# Patient Record
Sex: Male | Born: 1960 | Race: Black or African American | Hispanic: No | Marital: Single | Smoking: Never smoker
Health system: Southern US, Community
[De-identification: ages and names within clinical notes are randomized; demographics above are authoritative.]

## PROBLEM LIST (undated history)

## (undated) DIAGNOSIS — Z6841 Body Mass Index (BMI) 40.0 and over, adult: Secondary | ICD-10-CM

## (undated) DIAGNOSIS — R739 Hyperglycemia, unspecified: Secondary | ICD-10-CM

## (undated) DIAGNOSIS — Z95810 Presence of automatic (implantable) cardiac defibrillator: Secondary | ICD-10-CM

## (undated) DIAGNOSIS — N183 Chronic kidney disease, stage 3 unspecified: Secondary | ICD-10-CM

## (undated) DIAGNOSIS — M1990 Unspecified osteoarthritis, unspecified site: Secondary | ICD-10-CM

## (undated) DIAGNOSIS — I4892 Unspecified atrial flutter: Secondary | ICD-10-CM

## (undated) DIAGNOSIS — G4733 Obstructive sleep apnea (adult) (pediatric): Secondary | ICD-10-CM

## (undated) DIAGNOSIS — I5022 Chronic systolic (congestive) heart failure: Secondary | ICD-10-CM

## (undated) DIAGNOSIS — E785 Hyperlipidemia, unspecified: Secondary | ICD-10-CM

## (undated) DIAGNOSIS — K115 Sialolithiasis: Secondary | ICD-10-CM

## (undated) DIAGNOSIS — D638 Anemia in other chronic diseases classified elsewhere: Secondary | ICD-10-CM

## (undated) DIAGNOSIS — R079 Chest pain, unspecified: Secondary | ICD-10-CM

## (undated) DIAGNOSIS — I428 Other cardiomyopathies: Secondary | ICD-10-CM

## (undated) DIAGNOSIS — N529 Male erectile dysfunction, unspecified: Secondary | ICD-10-CM

## (undated) DIAGNOSIS — I10 Essential (primary) hypertension: Secondary | ICD-10-CM

## (undated) DIAGNOSIS — L0591 Pilonidal cyst without abscess: Secondary | ICD-10-CM

## (undated) HISTORY — DX: Morbid (severe) obesity due to excess calories: E66.01

## (undated) HISTORY — DX: Sialolithiasis: K11.5

## (undated) HISTORY — DX: Chronic kidney disease, stage 3 unspecified: N18.30

## (undated) HISTORY — DX: Chest pain, unspecified: R07.9

## (undated) HISTORY — DX: Obstructive sleep apnea (adult) (pediatric): G47.33

## (undated) HISTORY — DX: Hyperglycemia, unspecified: R73.9

## (undated) HISTORY — DX: Essential (primary) hypertension: I10

## (undated) HISTORY — DX: Unspecified atrial flutter: I48.92

## (undated) HISTORY — DX: Chronic kidney disease, stage 3 (moderate): N18.3

## (undated) HISTORY — DX: Male erectile dysfunction, unspecified: N52.9

## (undated) HISTORY — DX: Pilonidal cyst without abscess: L05.91

## (undated) HISTORY — PX: COLONOSCOPY: SHX174

## (undated) HISTORY — DX: Chronic systolic (congestive) heart failure: I50.22

## (undated) HISTORY — DX: Body Mass Index (BMI) 40.0 and over, adult: Z684

## (undated) HISTORY — DX: Other cardiomyopathies: I42.8

## (undated) HISTORY — DX: Anemia in other chronic diseases classified elsewhere: D63.8

## (undated) HISTORY — DX: Hyperlipidemia, unspecified: E78.5

## (undated) SURGERY — ECHOCARDIOGRAM, TRANSESOPHAGEAL
Anesthesia: Moderate Sedation

---

## 2001-02-14 ENCOUNTER — Emergency Department (HOSPITAL_COMMUNITY): Admission: EM | Admit: 2001-02-14 | Discharge: 2001-02-15 | Payer: Self-pay | Admitting: Emergency Medicine

## 2001-02-15 ENCOUNTER — Encounter: Payer: Self-pay | Admitting: Internal Medicine

## 2001-02-20 ENCOUNTER — Encounter: Admission: RE | Admit: 2001-02-20 | Discharge: 2001-02-20 | Payer: Self-pay | Admitting: Internal Medicine

## 2001-03-05 ENCOUNTER — Ambulatory Visit (HOSPITAL_COMMUNITY): Admission: RE | Admit: 2001-03-05 | Discharge: 2001-03-05 | Payer: Self-pay | Admitting: Internal Medicine

## 2001-03-14 ENCOUNTER — Encounter: Admission: RE | Admit: 2001-03-14 | Discharge: 2001-03-14 | Payer: Self-pay | Admitting: Internal Medicine

## 2001-04-19 ENCOUNTER — Encounter: Admission: RE | Admit: 2001-04-19 | Discharge: 2001-04-19 | Payer: Self-pay | Admitting: Internal Medicine

## 2001-06-06 ENCOUNTER — Encounter: Admission: RE | Admit: 2001-06-06 | Discharge: 2001-06-06 | Payer: Self-pay | Admitting: Internal Medicine

## 2001-08-08 ENCOUNTER — Encounter: Admission: RE | Admit: 2001-08-08 | Discharge: 2001-08-08 | Payer: Self-pay

## 2001-09-12 ENCOUNTER — Encounter: Admission: RE | Admit: 2001-09-12 | Discharge: 2001-09-12 | Payer: Self-pay | Admitting: Internal Medicine

## 2001-10-25 ENCOUNTER — Emergency Department (HOSPITAL_COMMUNITY): Admission: EM | Admit: 2001-10-25 | Discharge: 2001-10-25 | Payer: Self-pay | Admitting: Emergency Medicine

## 2001-12-12 ENCOUNTER — Encounter: Admission: RE | Admit: 2001-12-12 | Discharge: 2001-12-12 | Payer: Self-pay | Admitting: Internal Medicine

## 2002-05-16 ENCOUNTER — Encounter: Admission: RE | Admit: 2002-05-16 | Discharge: 2002-05-16 | Payer: Self-pay | Admitting: Internal Medicine

## 2002-11-06 ENCOUNTER — Encounter: Admission: RE | Admit: 2002-11-06 | Discharge: 2002-11-06 | Payer: Self-pay | Admitting: Infectious Diseases

## 2002-11-27 ENCOUNTER — Emergency Department (HOSPITAL_COMMUNITY): Admission: EM | Admit: 2002-11-27 | Discharge: 2002-11-27 | Payer: Self-pay | Admitting: Emergency Medicine

## 2003-10-07 ENCOUNTER — Encounter: Admission: RE | Admit: 2003-10-07 | Discharge: 2003-10-07 | Payer: Self-pay | Admitting: Internal Medicine

## 2003-10-23 ENCOUNTER — Emergency Department (HOSPITAL_COMMUNITY): Admission: EM | Admit: 2003-10-23 | Discharge: 2003-10-23 | Payer: Self-pay | Admitting: Emergency Medicine

## 2004-03-14 ENCOUNTER — Encounter: Admission: RE | Admit: 2004-03-14 | Discharge: 2004-03-14 | Payer: Self-pay | Admitting: Internal Medicine

## 2004-04-20 ENCOUNTER — Emergency Department (HOSPITAL_COMMUNITY): Admission: EM | Admit: 2004-04-20 | Discharge: 2004-04-20 | Payer: Self-pay

## 2004-10-20 ENCOUNTER — Ambulatory Visit: Payer: Self-pay | Admitting: Internal Medicine

## 2005-02-01 ENCOUNTER — Ambulatory Visit: Payer: Self-pay | Admitting: Internal Medicine

## 2005-02-08 ENCOUNTER — Ambulatory Visit: Payer: Self-pay | Admitting: Internal Medicine

## 2005-02-28 ENCOUNTER — Encounter (INDEPENDENT_AMBULATORY_CARE_PROVIDER_SITE_OTHER): Payer: Self-pay | Admitting: Cardiology

## 2005-02-28 ENCOUNTER — Ambulatory Visit (HOSPITAL_COMMUNITY): Admission: RE | Admit: 2005-02-28 | Discharge: 2005-02-28 | Payer: Self-pay | Admitting: Internal Medicine

## 2005-08-30 ENCOUNTER — Ambulatory Visit (HOSPITAL_COMMUNITY): Admission: RE | Admit: 2005-08-30 | Discharge: 2005-08-30 | Payer: Self-pay | Admitting: Internal Medicine

## 2005-08-30 ENCOUNTER — Ambulatory Visit: Payer: Self-pay | Admitting: Internal Medicine

## 2005-09-06 ENCOUNTER — Ambulatory Visit: Payer: Self-pay | Admitting: Internal Medicine

## 2005-11-30 ENCOUNTER — Ambulatory Visit: Payer: Self-pay | Admitting: Internal Medicine

## 2006-02-21 ENCOUNTER — Ambulatory Visit: Payer: Self-pay | Admitting: Internal Medicine

## 2006-04-05 ENCOUNTER — Ambulatory Visit: Payer: Self-pay | Admitting: Internal Medicine

## 2006-05-08 ENCOUNTER — Ambulatory Visit: Payer: Self-pay | Admitting: Internal Medicine

## 2006-05-08 ENCOUNTER — Encounter (INDEPENDENT_AMBULATORY_CARE_PROVIDER_SITE_OTHER): Payer: Self-pay | Admitting: Internal Medicine

## 2006-05-08 LAB — CONVERTED CEMR LAB
Cholesterol: 170 mg/dL (ref 0–200)
HDL: 31 mg/dL — ABNORMAL LOW (ref >39)
LDL Cholesterol: 119 mg/dL — ABNORMAL HIGH (ref 0–99)
Total CHOL/HDL Ratio: 5.5
Triglycerides: 98 mg/dL (ref <150)
VLDL: 20 mg/dL (ref 0–40)

## 2006-05-30 DIAGNOSIS — I119 Hypertensive heart disease without heart failure: Secondary | ICD-10-CM

## 2006-05-30 DIAGNOSIS — I428 Other cardiomyopathies: Secondary | ICD-10-CM | POA: Insufficient documentation

## 2006-05-30 DIAGNOSIS — N184 Chronic kidney disease, stage 4 (severe): Secondary | ICD-10-CM

## 2006-07-04 DIAGNOSIS — E785 Hyperlipidemia, unspecified: Secondary | ICD-10-CM

## 2006-07-04 DIAGNOSIS — Z6841 Body Mass Index (BMI) 40.0 and over, adult: Secondary | ICD-10-CM

## 2006-09-18 ENCOUNTER — Encounter (INDEPENDENT_AMBULATORY_CARE_PROVIDER_SITE_OTHER): Payer: Self-pay | Admitting: Internal Medicine

## 2006-09-18 ENCOUNTER — Ambulatory Visit: Payer: Self-pay | Admitting: Hospitalist

## 2006-09-18 LAB — CONVERTED CEMR LAB
BUN: 22 mg/dL (ref 6–23)
CO2: 22 meq/L (ref 19–32)
Calcium: 9.4 mg/dL (ref 8.4–10.5)
Chloride: 108 meq/L (ref 96–112)
Creatinine, Ser: 1.55 mg/dL — ABNORMAL HIGH (ref 0.40–1.50)
Glucose, Bld: 82 mg/dL (ref 70–99)
Potassium: 4.2 meq/L (ref 3.5–5.3)
Sodium: 145 meq/L (ref 135–145)

## 2006-11-21 ENCOUNTER — Ambulatory Visit: Payer: Self-pay | Admitting: Cardiology

## 2006-11-21 ENCOUNTER — Encounter: Payer: Self-pay | Admitting: Cardiology

## 2006-11-21 ENCOUNTER — Ambulatory Visit (HOSPITAL_COMMUNITY): Admission: RE | Admit: 2006-11-21 | Discharge: 2006-11-21 | Payer: Self-pay | Admitting: Internal Medicine

## 2007-06-25 ENCOUNTER — Telehealth: Payer: Self-pay | Admitting: *Deleted

## 2007-07-03 ENCOUNTER — Encounter (INDEPENDENT_AMBULATORY_CARE_PROVIDER_SITE_OTHER): Payer: Self-pay | Admitting: Internal Medicine

## 2007-07-03 ENCOUNTER — Ambulatory Visit: Payer: Self-pay | Admitting: Internal Medicine

## 2007-07-04 ENCOUNTER — Telehealth: Payer: Self-pay | Admitting: *Deleted

## 2007-07-04 LAB — CONVERTED CEMR LAB
ALT: 27 units/L (ref 0–53)
AST: 25 units/L (ref 0–37)
Albumin: 4.5 g/dL (ref 3.5–5.2)
Alkaline Phosphatase: 79 units/L (ref 39–117)
BUN: 17 mg/dL (ref 6–23)
CO2: 25 meq/L (ref 19–32)
Calcium: 9.4 mg/dL (ref 8.4–10.5)
Chloride: 108 meq/L (ref 96–112)
Cholesterol: 197 mg/dL (ref 0–200)
Creatinine, Ser: 1.54 mg/dL — ABNORMAL HIGH (ref 0.40–1.50)
Glucose, Bld: 85 mg/dL (ref 70–99)
HDL: 35 mg/dL — ABNORMAL LOW (ref >39)
LDL Cholesterol: 132 mg/dL — ABNORMAL HIGH (ref 0–99)
Potassium: 4 meq/L (ref 3.5–5.3)
Sodium: 143 meq/L (ref 135–145)
Total Bilirubin: 0.7 mg/dL (ref 0.3–1.2)
Total CHOL/HDL Ratio: 5.6
Total Protein: 7.5 g/dL (ref 6.0–8.3)
Triglycerides: 151 mg/dL — ABNORMAL HIGH (ref <150)
VLDL: 30 mg/dL (ref 0–40)

## 2007-08-19 ENCOUNTER — Ambulatory Visit: Payer: Self-pay | Admitting: Internal Medicine

## 2007-12-20 ENCOUNTER — Encounter (INDEPENDENT_AMBULATORY_CARE_PROVIDER_SITE_OTHER): Payer: Self-pay | Admitting: Infectious Diseases

## 2007-12-20 ENCOUNTER — Ambulatory Visit: Payer: Self-pay | Admitting: *Deleted

## 2007-12-20 LAB — CONVERTED CEMR LAB
ALT: 18 units/L (ref 0–53)
AST: 27 units/L (ref 0–37)
Albumin: 4.7 g/dL (ref 3.5–5.2)
Alkaline Phosphatase: 78 units/L (ref 39–117)
BUN: 19 mg/dL (ref 6–23)
CO2: 24 meq/L (ref 19–32)
Calcium: 9.6 mg/dL (ref 8.4–10.5)
Chloride: 107 meq/L (ref 96–112)
Creatinine, Ser: 1.45 mg/dL (ref 0.40–1.50)
Glucose, Bld: 94 mg/dL (ref 70–99)
Potassium: 4 meq/L (ref 3.5–5.3)
Sodium: 145 meq/L (ref 135–145)
Total Bilirubin: 0.6 mg/dL (ref 0.3–1.2)
Total Protein: 7.5 g/dL (ref 6.0–8.3)

## 2008-01-03 ENCOUNTER — Ambulatory Visit: Payer: Self-pay | Admitting: *Deleted

## 2008-01-03 ENCOUNTER — Encounter (INDEPENDENT_AMBULATORY_CARE_PROVIDER_SITE_OTHER): Payer: Self-pay | Admitting: Infectious Diseases

## 2008-01-03 LAB — CONVERTED CEMR LAB
Cholesterol: 147 mg/dL (ref 0–200)
HDL: 31 mg/dL — ABNORMAL LOW (ref >39)
LDL Cholesterol: 95 mg/dL (ref 0–99)
Total CHOL/HDL Ratio: 4.7
Triglycerides: 106 mg/dL (ref <150)
VLDL: 21 mg/dL (ref 0–40)

## 2008-02-03 ENCOUNTER — Telehealth (INDEPENDENT_AMBULATORY_CARE_PROVIDER_SITE_OTHER): Payer: Self-pay | Admitting: Internal Medicine

## 2008-06-19 ENCOUNTER — Emergency Department (HOSPITAL_COMMUNITY): Admission: EM | Admit: 2008-06-19 | Discharge: 2008-06-19 | Payer: Self-pay | Admitting: Emergency Medicine

## 2008-09-07 ENCOUNTER — Telehealth (INDEPENDENT_AMBULATORY_CARE_PROVIDER_SITE_OTHER): Payer: Self-pay | Admitting: Internal Medicine

## 2008-10-19 ENCOUNTER — Ambulatory Visit: Payer: Self-pay | Admitting: Internal Medicine

## 2008-10-19 ENCOUNTER — Encounter (INDEPENDENT_AMBULATORY_CARE_PROVIDER_SITE_OTHER): Payer: Self-pay | Admitting: Internal Medicine

## 2009-01-14 ENCOUNTER — Telehealth (INDEPENDENT_AMBULATORY_CARE_PROVIDER_SITE_OTHER): Payer: Self-pay | Admitting: Internal Medicine

## 2009-03-09 ENCOUNTER — Telehealth (INDEPENDENT_AMBULATORY_CARE_PROVIDER_SITE_OTHER): Payer: Self-pay | Admitting: Internal Medicine

## 2009-04-19 ENCOUNTER — Encounter (INDEPENDENT_AMBULATORY_CARE_PROVIDER_SITE_OTHER): Payer: Self-pay | Admitting: Internal Medicine

## 2009-04-19 ENCOUNTER — Ambulatory Visit: Payer: Self-pay | Admitting: Internal Medicine

## 2009-04-19 ENCOUNTER — Ambulatory Visit (HOSPITAL_COMMUNITY): Admission: RE | Admit: 2009-04-19 | Discharge: 2009-04-19 | Payer: Self-pay | Admitting: Infectious Diseases

## 2009-04-19 DIAGNOSIS — Z8674 Personal history of sudden cardiac arrest: Secondary | ICD-10-CM | POA: Insufficient documentation

## 2009-04-20 LAB — CONVERTED CEMR LAB
ALT: 18 units/L (ref 0–53)
AST: 25 units/L (ref 0–37)
Albumin: 4.7 g/dL (ref 3.5–5.2)
Alkaline Phosphatase: 72 units/L (ref 39–117)
BUN: 16 mg/dL (ref 6–23)
CO2: 23 meq/L (ref 19–32)
Calcium: 9.5 mg/dL (ref 8.4–10.5)
Chloride: 106 meq/L (ref 96–112)
Cholesterol: 182 mg/dL (ref 0–200)
Creatinine, Ser: 1.49 mg/dL (ref 0.40–1.50)
Creatinine, Urine: 20.3 mg/dL
Glucose, Bld: 103 mg/dL — ABNORMAL HIGH (ref 70–99)
HDL: 35 mg/dL — ABNORMAL LOW (ref >39)
LDL Cholesterol: 122 mg/dL — ABNORMAL HIGH (ref 0–99)
Microalb Creat Ratio: 24.6 mg/g (ref 0.0–30.0)
Microalb, Ur: 0.5 mg/dL (ref 0.00–1.89)
Potassium: 3.9 meq/L (ref 3.5–5.3)
Sodium: 145 meq/L (ref 135–145)
TSH: 1.079 microintl units/mL (ref 0.350–4.5)
Total Bilirubin: 0.7 mg/dL (ref 0.3–1.2)
Total CHOL/HDL Ratio: 5.2
Total Protein: 7.7 g/dL (ref 6.0–8.3)
Triglycerides: 127 mg/dL (ref <150)
VLDL: 25 mg/dL (ref 0–40)

## 2009-04-27 ENCOUNTER — Telehealth: Payer: Self-pay | Admitting: *Deleted

## 2009-08-26 ENCOUNTER — Emergency Department (HOSPITAL_COMMUNITY): Admission: EM | Admit: 2009-08-26 | Discharge: 2009-08-27 | Payer: Self-pay | Admitting: Emergency Medicine

## 2009-08-26 ENCOUNTER — Emergency Department (HOSPITAL_COMMUNITY): Admission: EM | Admit: 2009-08-26 | Discharge: 2009-08-26 | Payer: Self-pay | Admitting: Emergency Medicine

## 2009-09-02 ENCOUNTER — Ambulatory Visit: Payer: Self-pay | Admitting: Internal Medicine

## 2009-09-07 ENCOUNTER — Ambulatory Visit: Payer: Self-pay | Admitting: Internal Medicine

## 2009-09-07 LAB — CONVERTED CEMR LAB
ALT: 16 units/L (ref 0–53)
AST: 21 units/L (ref 0–37)
Albumin: 4.2 g/dL (ref 3.5–5.2)
Alkaline Phosphatase: 62 units/L (ref 39–117)
BUN: 16 mg/dL (ref 6–23)
CO2: 27 meq/L (ref 19–32)
Calcium: 9.6 mg/dL (ref 8.4–10.5)
Chloride: 107 meq/L (ref 96–112)
Cholesterol: 136 mg/dL (ref 0–200)
Creatinine, Ser: 1.47 mg/dL (ref 0.40–1.50)
Glucose, Bld: 93 mg/dL (ref 70–99)
HDL: 28 mg/dL — ABNORMAL LOW (ref >39)
LDL Cholesterol: 84 mg/dL (ref 0–99)
Potassium: 4.5 meq/L (ref 3.5–5.3)
Sodium: 145 meq/L (ref 135–145)
Total Bilirubin: 0.5 mg/dL (ref 0.3–1.2)
Total CHOL/HDL Ratio: 4.9
Total Protein: 7.2 g/dL (ref 6.0–8.3)
Triglycerides: 119 mg/dL (ref <150)
VLDL: 24 mg/dL (ref 0–40)

## 2009-09-09 ENCOUNTER — Emergency Department (HOSPITAL_COMMUNITY): Admission: EM | Admit: 2009-09-09 | Discharge: 2009-09-09 | Payer: Self-pay | Admitting: Emergency Medicine

## 2009-10-02 ENCOUNTER — Emergency Department (HOSPITAL_COMMUNITY): Admission: EM | Admit: 2009-10-02 | Discharge: 2009-10-02 | Payer: Self-pay | Admitting: Emergency Medicine

## 2009-10-21 ENCOUNTER — Emergency Department (HOSPITAL_COMMUNITY): Admission: EM | Admit: 2009-10-21 | Discharge: 2009-10-21 | Payer: Self-pay | Admitting: Emergency Medicine

## 2009-10-25 ENCOUNTER — Emergency Department (HOSPITAL_COMMUNITY): Admission: EM | Admit: 2009-10-25 | Discharge: 2009-10-26 | Payer: Self-pay | Admitting: Emergency Medicine

## 2009-12-03 ENCOUNTER — Emergency Department (HOSPITAL_COMMUNITY): Admission: EM | Admit: 2009-12-03 | Discharge: 2009-12-03 | Payer: Self-pay | Admitting: Emergency Medicine

## 2009-12-11 ENCOUNTER — Emergency Department (HOSPITAL_COMMUNITY): Admission: EM | Admit: 2009-12-11 | Discharge: 2009-12-11 | Payer: Self-pay | Admitting: Emergency Medicine

## 2010-02-14 ENCOUNTER — Telehealth: Payer: Self-pay | Admitting: Internal Medicine

## 2010-03-03 ENCOUNTER — Ambulatory Visit: Payer: Self-pay | Admitting: Internal Medicine

## 2010-03-03 DIAGNOSIS — N529 Male erectile dysfunction, unspecified: Secondary | ICD-10-CM

## 2010-03-04 LAB — CONVERTED CEMR LAB
Prolactin: 7.7 ng/mL (ref 2.1–17.1)
Testosterone: 221.47 ng/dL — ABNORMAL LOW (ref 350–890)

## 2010-03-10 ENCOUNTER — Encounter: Payer: Self-pay | Admitting: Internal Medicine

## 2010-03-15 ENCOUNTER — Emergency Department (HOSPITAL_COMMUNITY): Admission: EM | Admit: 2010-03-15 | Discharge: 2010-03-15 | Payer: Self-pay | Admitting: Family Medicine

## 2010-06-30 ENCOUNTER — Telehealth: Payer: Self-pay | Admitting: Internal Medicine

## 2010-07-24 HISTORY — PX: CARDIAC CATHETERIZATION: SHX172

## 2010-07-26 ENCOUNTER — Encounter: Payer: Self-pay | Admitting: Internal Medicine

## 2010-08-02 ENCOUNTER — Telehealth (INDEPENDENT_AMBULATORY_CARE_PROVIDER_SITE_OTHER): Payer: Self-pay | Admitting: *Deleted

## 2010-08-02 ENCOUNTER — Emergency Department (HOSPITAL_COMMUNITY)
Admission: EM | Admit: 2010-08-02 | Discharge: 2010-08-02 | Payer: Self-pay | Source: Home / Self Care | Admitting: Emergency Medicine

## 2010-08-02 ENCOUNTER — Encounter: Payer: Self-pay | Admitting: Internal Medicine

## 2010-08-08 LAB — POCT CARDIAC MARKERS
CKMB, poc: 3.8 ng/mL (ref 1.0–8.0)
Myoglobin, poc: 193 ng/mL (ref 12–200)
Troponin i, poc: <0.05 ng/mL (ref 0.00–0.09)

## 2010-08-08 LAB — DIFFERENTIAL
Basophils Absolute: 0 10*3/uL (ref 0.0–0.1)
Basophils Relative: 0 % (ref 0–1)
Eosinophils Absolute: 0.2 10*3/uL (ref 0.0–0.7)
Eosinophils Relative: 2 % (ref 0–5)
Lymphocytes Relative: 35 % (ref 12–46)
Lymphs Abs: 4.1 10*3/uL — ABNORMAL HIGH (ref 0.7–4.0)
Monocytes Absolute: 0.9 10*3/uL (ref 0.1–1.0)
Monocytes Relative: 8 % (ref 3–12)
Neutro Abs: 6.5 10*3/uL (ref 1.7–7.7)
Neutrophils Relative %: 55 % (ref 43–77)

## 2010-08-08 LAB — BASIC METABOLIC PANEL
BUN: 11 mg/dL (ref 6–23)
CO2: 28 mEq/L (ref 19–32)
Calcium: 9.2 mg/dL (ref 8.4–10.5)
Chloride: 109 mEq/L (ref 96–112)
Creatinine, Ser: 1.4 mg/dL (ref 0.4–1.5)
GFR calc Af Amer: >60 mL/min (ref >60)
GFR calc non Af Amer: 54 mL/min — ABNORMAL LOW (ref >60)
Glucose, Bld: 97 mg/dL (ref 70–99)
Potassium: 3.6 mEq/L (ref 3.5–5.1)
Sodium: 144 mEq/L (ref 135–145)

## 2010-08-08 LAB — CBC
HCT: 42.1 % (ref 39.0–52.0)
Hemoglobin: 13.6 g/dL (ref 13.0–17.0)
MCH: 28.2 pg (ref 26.0–34.0)
MCHC: 32.3 g/dL (ref 30.0–36.0)
MCV: 87.3 fL (ref 78.0–100.0)
Platelets: 188 10*3/uL (ref 150–400)
RBC: 4.82 MIL/uL (ref 4.22–5.81)
RDW: 12.9 % (ref 11.5–15.5)
WBC: 11.7 10*3/uL — ABNORMAL HIGH (ref 4.0–10.5)

## 2010-08-08 LAB — BRAIN NATRIURETIC PEPTIDE: Pro B Natriuretic peptide (BNP): 225 pg/mL — ABNORMAL HIGH (ref 0.0–100.0)

## 2010-08-10 ENCOUNTER — Ambulatory Visit: Admission: RE | Admit: 2010-08-10 | Discharge: 2010-08-10 | Payer: Self-pay | Source: Home / Self Care

## 2010-08-23 NOTE — Progress Notes (Signed)
Summary: REfill/gh  Phone Note Refill Request Message from:  Patient on February 14, 2010 2:25 PM  Refills Requested: Medication #1:  LASIX 80 MG TABS Take 1 tablet by mouth once a day   Last Refilled: 01/11/2010  Medication #2:  VASOTEC 20 MG TABS Take 2 tablets by mouth once daily   Last Refilled: 01/25/2010  Medication #3:  ZOCOR 40 MG TABS take 1 pill by mouth daily.. Last visit and labs were 08/2009.   Method Requested: Fax to Local Pharmacy Initial call taken by: Angelina Ok RN,  February 14, 2010 2:26 PM  Follow-up for Phone Call        Rx faxed to pharmacy. Follow-up by: Margarito Liner MD,  February 14, 2010 3:00 PM    Prescriptions: ZOCOR 40 MG TABS (SIMVASTATIN) take 1 pill by mouth daily.  #30 x 1   Entered and Authorized by:   Margarito Liner MD   Signed by:   Margarito Liner MD on 02/14/2010   Method used:   Faxed to ...       [facility] (retail)       [address] [place], [place]  [address]       Ph: [phone]       Fax: [phone]   RxID:   [id number] LASIX 80 MG TABS (FUROSEMIDE) Take 1 tablet by mouth once a day  #30 x 1   Entered and Authorized by:   Margarito Liner MD   Signed by:   Margarito Liner MD on 02/14/2010   Method used:   Faxed to ...       [facility] (retail)       [address] [place], [place]  [address]       Ph: [phone]       Fax: [phone]   RxID:   [id number] VASOTEC 20 MG TABS (ENALAPRIL MALEATE) Take 2 tablets by mouth once daily  #60 x 1   Entered and Authorized by:   Margarito Liner MD   Signed by:   Margarito Liner MD on 02/14/2010   Method used:   Faxed to ...       [facility] (retail)       [address] [place], [place]  [address]       Ph: [phone]       Fax: [phone]   RxID:   [id number]

## 2010-08-23 NOTE — Assessment & Plan Note (Signed)
Summary: est-ck/fu/meds/cfb   Vital Signs:  Patient profile:   50 year old male Height:      69.5 inches (176.53 cm) Weight:      305.0 pounds (138.64 kg) BMI:     44.56 Temp:     97.6 degrees F (36.44 degrees C) oral Pulse rate:   91 / minute BP sitting:   144 / 93  (left arm)  Vitals Entered By: Stanton Kidney Ditzler RN (September 02, 2009 1:42 PM) Is Patient Diabetic? No Pain Assessment Patient in pain? yes     Location: left leg Intensity: 3 Type: dull Onset of pain  gout - past 4 days Nutritional Status BMI of > 30 = obese Nutritional Status Detail appetite good  Have you ever been in a relationship where you felt threatened, hurt or afraid?denies   Does patient need assistance? Functional Status Self care Ambulation Normal Comments FU.   Primary Care Provider:  Lollie Sails MD   History of Present Illness: Eric Murillo is a 50 yo man with PMH as outlined in the EMR comes today for a f/u visit.   1. HTN: He didn't bring his medication today, but states he is taking his meds regularly.   2. HL: He takes his zocor, but he didn't bring his medicine.   4. Obesity: He has started to go back to gym.   5. CM: He wasn't able to get his ECHO done last time.   Depression History:      The patient denies a depressed mood most of the day and a diminished interest in his usual daily activities.         Preventive Screening-Counseling & Management  Alcohol-Tobacco     Smoking Status: never  Caffeine-Diet-Exercise     Does Patient Exercise: no  Current Medications (verified): 1)  Vasotec 20 Mg Tabs (Enalapril Maleate) .... Take 2 Tablets By Mouth Once Daily 2)  Lasix 80 Mg Tabs (Furosemide) .... Take 1 Tablet By Mouth Once A Day 3)  Zocor 40 Mg Tabs (Simvastatin) .... Take 1 Pill By Mouth Daily.  Allergies: 1)  ! Beta Blockers  Review of Systems      See HPI  Physical Exam  General:  alert.   Lungs:  normal breath sounds, no crackles, and no wheezes.   Heart:   normal rate, regular rhythm, no murmur, and no gallop.   Abdomen:  soft and non-tender.   Extremities:  trace left pedal edema and trace right pedal edema.   Neurologic:  alert & oriented X3.     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 He wasnot able to do ECHO last ime, will try to get one now.   Problem # 2:  HYPERTENSION (ICD-401.9) BP close to goal, will cont his regimen. CHeck following labs.  His updated medication list for this problem includes:    Vasotec 20 Mg Tabs (Enalapril maleate) .Marland Kitchen... Take 2 tablets by mouth once daily    Lasix 80 Mg Tabs (Furosemide) .Marland Kitchen... Take 1 tablet by mouth once a day  Orders: T-Lipid Profile ([id number]) T-Comprehensive Metabolic Panel ([id number])  BP today: 144/93 Prior BP: 142/84 (04/19/2009)  Labs Reviewed: K+: 3.9 (04/19/2009) Creat: : 1.49 (04/19/2009)   Chol: 182 (04/19/2009)   HDL: 35 (04/19/2009)   LDL: 122 (04/19/2009)   TG: 127 (04/19/2009)  Problem # 3:  RENAL INSUFFICIENCY (ICD-588.9) Check renal fnl.  Orders: T-Lipid Profile ([id number]) T-Comprehensive Metabolic Panel [phone])  Problem #  4:  DYSLIPIDEMIA (ICD-272.4) Cont same and check followings.  His updated medication list for this problem includes:    Zocor 40 Mg Tabs (Simvastatin) .Marland Kitchen... Take 1 pill by mouth daily.  Orders: T-Lipid Profile ([id number]) T-Comprehensive Metabolic Panel [phone] [phone])  Problem # 5:  OVERWEIGHT (ICD-278.02) Encouraged to remain active and check his diet.  Orders: T-Lipid Profile [id number]) T-Comprehensive Metabolic Panel [phone])  Complete Medication List: 1)  Vasotec 20 Mg Tabs (Enalapril maleate) .... Take 2 tablets by mouth once daily 2)  Lasix 80 Mg Tabs (Furosemide) .... Take 1 tablet by mouth once a day 3)  Zocor 40 Mg Tabs (Simvastatin) .... Take 1 pill by mouth daily.  Patient Instructions: 1)  Limit your Sodium (Salt) to less than 2 grams  a day(slightly less than 1/2 a teaspoon) to prevent fluid retention, swelling, or worsening of symptoms. 2)  It is important that you exercise regularly at least 20 minutes 5 times a week. If you develop chest pain, have severe difficulty breathing, or feel very tired , stop exercising immediately and seek medical attention. 3)  You need to lose weight. Consider a lower calorie diet and regular exercise.  4)  Check your Blood Pressure regularly. If it is above: you should make an appointment. 5)  Please schedule a follow-up appointment in 6 months. Prescriptions: ZOCOR 40 MG TABS (SIMVASTATIN) take 1 pill by mouth daily.  #30 x 3   Entered and Authorized by:   Jason Coop MD   Signed by:   Jason Coop MD on 09/02/2009   Method used:   Print then Give to Patient   RxID:   [id number] LASIX 80 MG TABS (FUROSEMIDE) Take 1 tablet by mouth once a day  #30 x 3   Entered and Authorized by:   Jason Coop MD   Signed by:   Jason Coop MD on 09/02/2009   Method used:   Print then Give to Patient   RxID:   [id number] VASOTEC 20 MG TABS (ENALAPRIL MALEATE) Take 2 tablets by mouth once daily  #60 x 3   Entered and Authorized by:   Jason Coop MD   Signed by:   Jason Coop MD on 09/02/2009   Method used:   Print then Give to Patient   RxID:   [id number]  Process Orders Check Orders Results:     [facility]: ABN not required for this insurance Tests Sent for requisitioning (September 03, 2009 1:38 PM):     09/02/2009: [facility] -- T-Lipid Profile [phone] (signed)     09/02/2009: [facility] -- T-Comprehensive Metabolic Panel [phone] (signed)    Process Orders Check Orders Results:     [facility]: ABN not required for this insurance Tests Sent for requisitioning (September 03, 2009 1:38 PM):     09/02/2009: [facility] -- T-Lipid Profile  [phone] (signed)     09/02/2009: [facility] -- T-Comprehensive Metabolic Panel [phone] (signed)     Prevention & Chronic Care Immunizations   Influenza vaccine: Fluvax Non-MCR  (04/19/2009)    Tetanus booster: Not documented    Pneumococcal vaccine: Not documented  Other Screening   PSA: Not documented   Smoking status: never  (09/02/2009)  Lipids   Total Cholesterol: 182  (04/19/2009)   LDL: 122  (04/19/2009)   LDL Direct: Not documented   HDL: 35  (04/19/2009)   Triglycerides: 127  (04/19/2009)    SGOT (AST): 25  (04/19/2009)   SGPT (ALT): 18  (  04/19/2009) CMP ordered    Alkaline phosphatase: 72  (04/19/2009)   Total bilirubin: 0.7  (04/19/2009)    Lipid flowsheet reviewed?: Yes   Progress toward LDL goal: Unchanged  Hypertension   Last Blood Pressure: 144 / 93  (09/02/2009)   Serum creatinine: 1.49  (04/19/2009)   Serum potassium 3.9  (04/19/2009) CMP ordered     Hypertension flowsheet reviewed?: Yes   Progress toward BP goal: Unchanged  Self-Management Support :   Personal Goals (by the next clinic visit) :      Personal blood pressure goal: 140/90  (09/02/2009)     Personal LDL goal: 100  (09/02/2009)    Patient will work on the following items until the next clinic visit to reach self-care goals:     Medications and monitoring: take my medicines every day, weigh myself weekly  (09/02/2009)     Eating: eat more vegetables, use fresh or frozen vegetables, eat foods that are low in salt, eat fruit for snacks and desserts, limit or avoid alcohol  (09/02/2009)     Activity: take a 30 minute walk every day  (09/02/2009)    Hypertension self-management support: Written self-care plan  (09/02/2009)   Hypertension self-care plan printed.    Lipid self-management support: Written self-care plan  (09/02/2009)   Lipid self-care plan printed.

## 2010-08-23 NOTE — Assessment & Plan Note (Signed)
Summary: RA/NEEDS REFILL ON MEDS AND CHECKUP/CH   Vital Signs:  Patient profile:   50 year old male Height:      69.5 inches (176.53 cm) Weight:      298.9 pounds (135.86 kg) BMI:     43.66 Temp:     98.0 degrees F (36.67 degrees C) oral Pulse rate:   87 / minute BP sitting:   133 / 92  (right arm)  Vitals Entered By: Stanton Kidney Ditzler RN (March 03, 2010 3:31 PM) Is Patient Diabetic? No Pain Assessment Patient in pain? no      Nutritional Status BMI of > 30 = obese Nutritional Status Detail appetite good  Have you ever been in a relationship where you felt threatened, hurt or afraid?denies   Does patient need assistance? Functional Status Self care Ambulation Normal Comments Ck-up and refills on meds.   Primary Care Provider:  Lollie Sails MD   History of Present Illness: Patient comes in today for his regular check-up.  No complaints.   Does complain of erections not lasting when he gets them. Says this has started since he started taking blood pressure medication.  He is able to masturbate to orgasm, however he does have difficulty maintaining an erection with sexual intercourse.    No CP, SOB, palpitations, N/V/constipation/diarrhea, joint pains, dizziness, vision changes, swelling in his extremities.   Depression History:      The patient denies a depressed mood most of the day and a diminished interest in his usual daily activities.         Preventive Screening-Counseling & Management  Alcohol-Tobacco     Smoking Status: never  Caffeine-Diet-Exercise     Does Patient Exercise: no  Current Problems (verified): 1)  Erectile Dysfunction, Organic  (ICD-607.84) 2)  Irregular Heart Rate  (ICD-427.9) 3)  Health Maintenance Exam  (ICD-V70.0) 4)  Cardiomyopathy, Dilated  (ICD-425.4) 5)  Hypertension  (ICD-401.9) 6)  Renal Insufficiency  (ICD-588.9) 7)  Dyslipidemia  (ICD-272.4) 8)  Overweight  (ICD-278.02)  Current Medications (verified): 1)  Vasotec 20 Mg  Tabs (Enalapril Maleate) .... Take 2 Tablets By Mouth Once Daily 2)  Lasix 80 Mg Tabs (Furosemide) .... Take 1 Tablet By Mouth Once A Day 3)  Zocor 40 Mg Tabs (Simvastatin) .... Take 1 Pill By Mouth Daily. 4)  Viagra 50 Mg Tabs (Sildenafil Citrate) .... Take 1 Tablet By Mouth Daily As Needed For Sexual Activity 5)  Aspirin 81 Mg Tabs (Aspirin) .... Take 1 Tablet By Mouth Daily  Allergies: 1)  ! 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  Unemployed, formerly a Film/video editor.  1 sexual partner (male), no contraceptives.  1 adult child.  tobacco - none alcohol - none drugs - none  Review of Systems       see HPI  Physical Exam  General:  NAD, muscular obese male Eyes:  pupils equal, pupils round, and pupils reactive to light.   Mouth:  pharynx pink and moist and fair dentition.   Neck:  supple, full ROM, and no masses.   Lungs:  normal respiratory effort, normal breath sounds, no crackles, and no wheezes.   Heart:  normal rate, regular rhythm, and no murmur.   Abdomen:  soft, non-tender,  and normal bowel sounds.   Msk:  normal ROM, no joint tenderness, and no joint swelling.   Pulses:  2+ bilateral pedal pulses Extremities:  no edema Neurologic:  alert & oriented X3, strength normal in all extremities, sensation intact to light touch, and gait normal.   Cervical Nodes:  no anterior cervical adenopathy.   Psych:  Oriented X3, normally interactive, good eye contact, not anxious appearing, and not depressed appearing.     Impression & Recommendations:  Problem # 1:  CARDIOMYOPATHY, DILATED (ICD-425.4) Patient with ECHO in 2008 showing almost full recover to normal EF (EF 50 % to 55 % with LV wall  thickness at upper limits of normal).  Patient is not currently symptomatic.  Do not see need for repeat ECHO at this time.  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  However, he is not on a beta blocker, which is best known for causing ED.  We will check the patient's testosterone and prolactin levels, as if these are abnormal they can be addressed, however I suspect that the patient's HTN has contributed to the development of his ED.  Also, he has had this problem for several years (only bringing to our attention today), and there is likely a psychogenic component to his difficulty in maintaining an erection as he worries about it.  We will try a small trial of viagra.  His updated medication list for this problem includes:    Viagra 50 Mg Tabs (Sildenafil citrate) .Marland Kitchen... Take 1 tablet by mouth daily as needed for sexual activity  Orders: T-Prolactin ([id number]) T-Testosterone; Total [phone])  Problem # 3:  HYPERTENSION (ICD-401.9) Patient is very near goal today; we will continue his regimen and check a B-Met at his next visit.  His updated medication list for this problem includes:    Vasotec 20 Mg Tabs (Enalapril maleate) .Marland Kitchen... Take 2 tablets by mouth once daily    Lasix 80 Mg Tabs (Furosemide) .Marland Kitchen... Take 1 tablet by mouth once a day  Problem # 4:  RENAL INSUFFICIENCY (ICD-588.9) Patient has h/o renal insufficiency with a creatinine in the high-normal limits.  He has a large amount of muscle mass, so it is possible that this is his normal creatinine with good renal function, however his HTN cannot be overlooked.  His creatinine has been stable over the past few years; we will recheck his kidney function at his next appointment.  Problem # 5:  DYSLIPIDEMIA (ICD-272.4) The patient's last lipid panel was within goal.  We will recheck a lipid panel at his next visit in 6 months.  Continue the  simvastatin at 40mg  daily. His updated medication list for this problem includes:    Zocor 40 Mg Tabs (Simvastatin) .Marland Kitchen... Take 1 pill by mouth daily.  Problem # 6:  OVERWEIGHT (ICD-278.02) The patient would like to lose weight; I gave him information on attending [employer] [employer] [facility]'s healthy living class, and he expressed interest.  I counseled him on continuing to work on his diet and exercise.  Problem # 7:  Preventive Health Care (ICD-V70.0) We will start the patient on a daily baby aspirin to reduce his risk of MI as a male >45y/o.  The patient recieved a tetanus shot today.  He is not yet 50 years old, however, when he returns to see [place] in 6 months, he will likely need a referral then for a colonoscopy (no FH of colon Ca).  Patient cannot  remember his last eye exam; we will place a referral for that today.  Complete Medication List: 1)  Vasotec 20 Mg Tabs (Enalapril maleate) .... Take 2 tablets by mouth once daily 2)  Lasix 80 Mg Tabs (Furosemide) .... Take 1 tablet by mouth once a day 3)  Zocor 40 Mg Tabs (Simvastatin) .... Take 1 pill by mouth daily. 4)  Viagra 50 Mg Tabs (Sildenafil citrate) .... Take 1 tablet by mouth daily as needed for sexual activity 5)  Aspirin 81 Mg Tabs (Aspirin) .... Take 1 tablet by mouth daily  Other Orders: Tdap => 75yrs IM ([id number]) Admin 1st Vaccine ([id number]) Ophthalmology Referral (Ophthalmology)  Patient Instructions: 1)  Please take your medicines as directed. 2)  Please return to the clinic in 6 months. 3)  Please continue to work on your diet and exercise as it is important that you continue to try to lose weight. 4)  Please start taking an aspirin 81mg  by mouth daily.   Prescriptions: VIAGRA 50 MG TABS (SILDENAFIL CITRATE) Take 1 tablet by mouth daily as needed for sexual activity  #2 x 2   Entered and Authorized by:   Danelle Berry, MD   Signed by:   Danelle Berry, MD on 03/03/2010   Method used:   Print then Give to Patient   RxID:    [id number] ZOCOR 40 MG TABS (SIMVASTATIN) take 1 pill by mouth daily.  #30 x 5   Entered and Authorized by:   Danelle Berry, MD   Signed by:   Danelle Berry, MD on 03/03/2010   Method used:   Print then Give to Patient   RxID:   [id number] LASIX 80 MG TABS (FUROSEMIDE) Take 1 tablet by mouth once a day  #30 x 5   Entered and Authorized by:   Danelle Berry, MD   Signed by:   Danelle Berry, MD on 03/03/2010   Method used:   Print then Give to Patient   RxID:   [id number] VASOTEC 20 MG TABS (ENALAPRIL MALEATE) Take 2 tablets by mouth once daily  #60 x 5   Entered and Authorized by:   Danelle Berry, MD   Signed by:   Danelle Berry, MD on 03/03/2010   Method used:   Print then Give to Patient   RxID:   [id number]  Process Orders Check Orders Results:     [facility]: ABN not required for this insurance Tests Sent for requisitioning (March 03, 2010 5:17 PM):     03/03/2010: [facility] -- T-Prolactin [[id number]] (signed)     03/03/2010: [facility] -- T-Testosterone; Total [phone] (signed)     Prevention & Chronic Care Immunizations   Influenza vaccine: Fluvax Non-MCR  (04/19/2009)    Tetanus booster: 03/03/2010: Tdap    Pneumococcal vaccine: Not documented  Other Screening   PSA: Not documented   Smoking status: never  (03/03/2010)  Lipids   Total Cholesterol: 136  (09/07/2009)   LDL: 84  (09/07/2009)   LDL Direct: Not documented   HDL: 28  (09/07/2009)   Triglycerides: 119  (09/07/2009)    SGOT (AST): 21  (09/07/2009)   SGPT (ALT): 16  (09/07/2009)   Alkaline phosphatase: 62  (09/07/2009)   Total bilirubin: 0.5  (09/07/2009)  Hypertension   Last Blood Pressure: 133 / 92  (03/03/2010)   Serum creatinine: 1.47  (09/07/2009)   Serum potassium 4.5  (09/07/2009)  Self-Management Support :   Personal Goals (by the next clinic visit) :  Personal blood pressure goal: 140/90   (09/02/2009)     Personal LDL goal: 100  (09/02/2009)    Patient will work on the following items until the next clinic visit to reach self-care goals:     Medications and monitoring: take my medicines every day, bring all of my medications to every visit, weigh myself weekly  (03/03/2010)     Eating: eat more vegetables, use fresh or frozen vegetables, eat foods that are low in salt, eat fruit for snacks and desserts, limit or avoid alcohol  (03/03/2010)     Activity: take a 30 minute walk every day, take the stairs instead of the elevator  (03/03/2010)    Hypertension self-management support: Written self-care plan, Education handout, Resources for patients handout  (03/03/2010)   Hypertension self-care plan printed.   Hypertension education handout printed    Lipid self-management support: Written self-care plan, Education handout, Resources for patients handout  (03/03/2010)   Lipid self-care plan printed.   Lipid education handout printed      Resource handout printed.   Nursing Instructions: Give tetanus booster today      Tetanus Vaccine (to be given today)  Appended Document: RA/NEEDS REFILL ON MEDS AND CHECKUP/CH Mr. Cott history and physical examination were reviewed with Dr. Claudette Laws and his assessment and plan were formulated togther.  I agree with the above documentation.  Another possible contributing factor to his ED is his long standing hyperlipidemia, in addition to his long standing HTN, resulting in microvascular disease.  It is important to rule out hypogonadism and hyperprolactinemia as causes of his ED since these would be managed differently.

## 2010-08-25 NOTE — Consult Note (Signed)
Summary: EYE   EYE   Imported By: Margie Billet 08/03/2010 09:40:23  _____________________________________________________________________  External Attachment:    Type:   Image     Comment:   External Document  Appended Document: EYE  Pt is glaucoma suspect (enlarged C:D ratio), needs yearly eye exam.

## 2010-08-25 NOTE — Progress Notes (Signed)
Summary: refill/gg  Phone Note Refill Request  on June 30, 2010 4:59 PM  Refills Requested: Medication #1:  VASOTEC 20 MG TABS Take 2 tablets by mouth once daily *** [facility] does not have vasotec, will you change to lisinopril or benazepril ?   Method Requested: Fax to Local Pharmacy Initial call taken by: Merrie Roof RN,  June 30, 2010 4:59 PM  Follow-up for Phone Call        Vasotec switched to lisinopril 40 mg by mouth daily.  Please inform Mr. Mciver the new prescription has been faxed to the [facility].  Please schedule Mr. Ciullo for follow-up with Dr. Claudette Laws on August 25, 2010.  Thank You. Follow-up by: Doneen Poisson MD,  June 30, 2010 5:30 PM  Additional Follow-up for Phone Call Additional follow up Details #1::        scheduled on 2/23 at 3:00, first available appointment.  Pt called and message taken for OV. Additional Follow-up by: Merrie Roof RN,  July 01, 2010 3:38 PM    Additional Follow-up for Phone Call Additional follow up Details #2::    Thank you.  I agree with the February 23 appointment. Follow-up by: Doneen Poisson MD,  July 01, 2010 3:45 PM  New/Updated Medications: LISINOPRIL 40 MG TABS (LISINOPRIL) take one tablet by mouth once a day Prescriptions: LISINOPRIL 40 MG TABS (LISINOPRIL) take one tablet by mouth once a day  #30 x 11   Entered and Authorized by:   Doneen Poisson MD   Signed by:   Doneen Poisson MD on 06/30/2010   Method used:   Faxed to ...       [facility] (retail)             [place], [place]         Ph:        Fax: [phone]   RxID:   [id number]

## 2010-08-25 NOTE — Miscellaneous (Signed)
  Patient presented to [facility] with increased SOB thought to be 2/2 to mild CHF, ED physician elected to treat as an outpatient by temporarily increasing the patients Lasix to 80mg  by mouth daily for several days and called [place] to arrange a follow up with the patient soon to continue managing him in obtaining an optimal fluid status and manage his other problems.

## 2010-08-25 NOTE — Assessment & Plan Note (Signed)
Summary: ACUTE-1 WEEK F/U/CFB   Vital Signs:  Patient profile:   50 year old male Height:      69.5 inches (176.53 cm) Weight:      309.2 pounds (140.55 kg) BMI:     45.17 Temp:     97.0 degrees F (36.11 degrees C) oral Pulse rate:   98 / minute BP sitting:   138 / 86  (left arm)  Vitals Entered By: Stanton Kidney Ditzler RN (August 10, 2010 9:19 AM) Is Patient Diabetic? No Pain Assessment Patient in pain? no      Nutritional Status BMI of > 30 = obese Nutritional Status Detail appetite good  Have you ever been in a relationship where you felt threatened, hurt or afraid?denies   Does patient need assistance? Functional Status Self care Ambulation Normal Comments ER FU - fluid both legs - better.   Primary Care Provider:  Danelle Berry, MD   History of Present Illness: 50yo M presents for follow-up of ED visit in which he complained of bilateral lower extremity edema and shortness of breath. After evaluation, it was determined that he was experiencing mild CHF exacerbation and he was discharged with instructions to increase Lasix dose for the next few days. Patient did this and has no further difficulties with fluid overload or shortness of breath. Patient attributes CHF exacerbation to recently switching from enalapril to lisinopril because the [facility] does not carry enalapril (this switch occurred approximately 2 weeks prior to the ED visit). He has sinced stopped the lisinopril himself and has been paying more to fill enalapril at [facility]. He denies chest pain, shortness of breath, palpitations, or other concerns.   Depression History:      The patient denies a depressed mood most of the day and a diminished interest in his usual daily activities.         Preventive Screening-Counseling & Management  Alcohol-Tobacco     Smoking Status: never  Caffeine-Diet-Exercise     Does Patient Exercise: no  Current Medications (verified): 1)  Lasix 80 Mg Tabs (Furosemide)  .... Take 1 Tablet By Mouth Once A Day 2)  Zocor 40 Mg Tabs (Simvastatin) .... Take 1 Pill By Mouth Daily. 3)  Viagra 50 Mg Tabs (Sildenafil Citrate) .... Take 1 Tablet By Mouth Daily As Needed For Sexual Activity 4)  Aspirin 81 Mg Tabs (Aspirin) .... Take 1 Tablet By Mouth Daily 5)  Enalapril Maleate 20 Mg Tabs (Enalapril Maleate) .... Take 2 Tablets By Mouth Once A Day  Allergies: 1)  ! 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  Unemployed, formerly a Film/video editor.  1 sexual partner (male), no contraceptives.  1 adult child.  tobacco - none alcohol - none drugs - none  Review of Systems      See HPI General:  Denies chills and fever. CV:  Denies chest pain or discomfort, difficulty breathing while lying down, lightheadness, palpitations, and swelling of feet. Resp:  Denies cough and sputum productive. GI:  Denies abdominal pain and change in bowel habits.  Physical Exam  General:  alert, cooperative to examination, and overweight-appearing.   Head:  normocephalic and atraumatic.   Eyes:  vision grossly intact, pupils equal,  pupils round, and pupils reactive to light.   Mouth:  pharynx pink and moist.   Neck:  supple and no masses.   Lungs:  normal breath sounds, no crackles, and no wheezes.   Heart:  normal rate, regular rhythm, no murmur, no gallop, and no rub.   Abdomen:  soft and non-tender.   Pulses:  2+ dorsalis pedis pulses bilaterally Extremities:  trace left pedal edema and trace right pedal edema.   Neurologic:  alert & oriented X3, cranial nerves grossly intact, strength normal in all extremities, and sensation intact to light touch.     Skin:  turgor normal and no rashes.   Psych:  Oriented X3, normally interactive, good eye contact, not anxious appearing, and not depressed appearing.     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 I think that switch from enalapril to lisinopril is unlikely to have contributed to CHF exacerbation. However, due to patient's concern about lisinopril and due to the fact that he is stable without signs of fluid overload, will continue current regimen (including enalapril). Will continue to assess fluid status at follow-up.   Problem # 2:  HYPERTENSION (ICD-401.9) Stable. Continue current regimen.   His updated medication list for this problem includes:    Lasix 80 Mg Tabs (Furosemide) .Marland Kitchen... Take 1 tablet by mouth once a day    Enalapril Maleate 20 Mg Tabs (Enalapril maleate) .Marland Kitchen... Take 2 tablets by mouth once a day  Problem # 3:  OVERWEIGHT (ICD-278.02) Patient has lost 5-6 pounds since last visit. Patient's weight loss efforts reinforced.   Complete Medication List: 1)  Lasix 80 Mg Tabs (Furosemide) .... Take 1 tablet by mouth once a day 2)  Zocor 40 Mg Tabs (Simvastatin) .... Take 1 pill by mouth daily. 3)  Viagra 50 Mg Tabs (Sildenafil citrate) .... Take 1 tablet by mouth daily as needed for sexual activity 4)  Aspirin 81 Mg Tabs (Aspirin) .... Take 1 tablet by mouth daily 5)  Enalapril Maleate 20 Mg Tabs (Enalapril maleate) .... Take 2 tablets by mouth once a day  Patient Instructions: 1)  Please follow-up with Dr. Claudette Murillo on your previously scheduled appointment on February 23rd at 3pm.  Prescriptions: ENALAPRIL MALEATE 20 MG TABS (ENALAPRIL MALEATE) Take 2 tablets by mouth once a day  #60 x 2   Entered and Authorized by:   Whitney Post MD   Signed by:   Whitney Post MD on 08/10/2010   Method used:   Electronically to         [facility]* (retail)       [address]       [address] Shipping/mailing       [place], [place]  [address]       Ph: [phone]       Fax: [phone]   RxID:   [id number]    Orders Added: 1)  Est. Patient Level IV [53664]     Prevention & Chronic Care Immunizations   Influenza vaccine: Fluvax Non-MCR  (04/19/2009)    Tetanus booster: 03/03/2010: Tdap    Pneumococcal vaccine: Not documented  Other Screening   PSA: Not documented   Smoking status: never  (08/10/2010)  Lipids   Total Cholesterol: 136  (09/07/2009)   LDL: 84  (09/07/2009)   LDL Direct: Not documented   HDL: 28  (  09/07/2009)   Triglycerides: 119  (09/07/2009)    SGOT (AST): 21  (09/07/2009)   SGPT (ALT): 16  (09/07/2009)   Alkaline phosphatase: 62  (09/07/2009)   Total bilirubin: 0.5  (09/07/2009)    Lipid flowsheet reviewed?: Yes   Progress toward LDL goal: Unchanged  Hypertension   Last Blood Pressure: 138 / 86  (08/10/2010)   Serum creatinine: 1.47  (09/07/2009)   Serum potassium 4.5  (09/07/2009)    Hypertension flowsheet reviewed?: Yes   Progress toward BP goal: Unchanged  Self-Management Support :   Personal Goals (by the next clinic visit) :      Personal blood pressure goal: 140/90  (09/02/2009)     Personal LDL goal: 100  (09/02/2009)    Patient will work on the following items until the next clinic visit to reach self-care goals:     Medications and monitoring: take my medicines every day, check my blood pressure, bring all of my medications to every visit, weigh myself weekly  (08/10/2010)     Eating: eat more vegetables, use fresh or frozen vegetables, eat foods that are low in salt, eat fruit for snacks and desserts, limit or avoid alcohol  (08/10/2010)     Activity: take a 30 minute walk every day, take the stairs instead of the elevator  (08/10/2010)    Hypertension self-management support: Written self-care plan, Education handout,  Resources for patients handout  (08/10/2010)   Hypertension self-care plan printed.   Hypertension education handout printed    Lipid self-management support: Written self-care plan, Education handout, Resources for patients handout  (08/10/2010)   Lipid self-care plan printed.   Lipid education handout printed      Resource handout printed.

## 2010-08-25 NOTE — Progress Notes (Signed)
Summary: phone/gg  Phone Note Call from Patient   Caller: Patient Summary of Call: Pt called with c/o fluid in ankles and legs. Also c/o SOB, and tightness in chest with ambulation.  Onset 1 - 2 days ago  Pt changed from   VASOTEC 20 MG TABS Take 2 tablets by mouth once daily to lisinopril 40 mg daily  on 12/8  and has had less urinary  output since then. # [id number] We can see in clinic tomorrow PM but the chest tightness bothers me, should I sent to ED now for evaluation? Initial call taken by: Merrie Roof RN,  August 02, 2010 4:00 PM  Additional Follow-up for Phone Call Additional follow up Details #1::        Talked with Dr Aundria Rud and he advised pt to go to ED for evaluation.   Pt called and informed. Patient/caller verbalizes understanding of these instructions.  Additional Follow-up by: Merrie Roof RN,  August 02, 2010 4:37 PM

## 2010-08-26 NOTE — Letter (Signed)
Summary: [facility]   Imported By: Florinda Marker 03/11/2010 16:30:06  _____________________________________________________________________  External Attachment:    Type:   Image     Comment:   External Document

## 2010-09-15 ENCOUNTER — Ambulatory Visit (INDEPENDENT_AMBULATORY_CARE_PROVIDER_SITE_OTHER): Payer: Self-pay | Admitting: Internal Medicine

## 2010-09-15 ENCOUNTER — Encounter: Payer: Self-pay | Admitting: Internal Medicine

## 2010-09-15 ENCOUNTER — Encounter: Payer: Self-pay | Admitting: Cardiology

## 2010-09-15 VITALS — BP 139/83 | HR 55 | Temp 98.2°F | Ht 69.5 in | Wt 302.1 lb

## 2010-09-15 DIAGNOSIS — Z0000 Encounter for general adult medical examination without abnormal findings: Secondary | ICD-10-CM | POA: Insufficient documentation

## 2010-09-15 DIAGNOSIS — I499 Cardiac arrhythmia, unspecified: Secondary | ICD-10-CM

## 2010-09-15 DIAGNOSIS — E663 Overweight: Secondary | ICD-10-CM

## 2010-09-15 DIAGNOSIS — I5022 Chronic systolic (congestive) heart failure: Secondary | ICD-10-CM | POA: Insufficient documentation

## 2010-09-15 DIAGNOSIS — N259 Disorder resulting from impaired renal tubular function, unspecified: Secondary | ICD-10-CM

## 2010-09-15 DIAGNOSIS — I5020 Unspecified systolic (congestive) heart failure: Secondary | ICD-10-CM

## 2010-09-15 DIAGNOSIS — I10 Essential (primary) hypertension: Secondary | ICD-10-CM

## 2010-09-15 DIAGNOSIS — E785 Hyperlipidemia, unspecified: Secondary | ICD-10-CM

## 2010-09-15 DIAGNOSIS — N529 Male erectile dysfunction, unspecified: Secondary | ICD-10-CM

## 2010-09-15 MED ORDER — AMLODIPINE BESYLATE 5 MG PO TABS
5.0000 mg | ORAL_TABLET | Freq: Every day | ORAL | Status: DC
Start: 2010-09-15 — End: 2010-11-07

## 2010-09-15 MED ORDER — ENALAPRIL MALEATE 20 MG PO TABS: 40.0000 mg | ORAL_TABLET | Freq: Every day | ORAL | Status: DC

## 2010-09-15 MED ORDER — ASPIRIN 81 MG PO TABS: 81.0000 mg | ORAL_TABLET | Freq: Every day | ORAL | Status: DC

## 2010-09-15 MED ORDER — SIMVASTATIN 40 MG PO TABS
40.0000 mg | ORAL_TABLET | Freq: Every day | ORAL | Status: DC
Start: 2010-09-15 — End: 2011-11-11

## 2010-09-15 MED ORDER — FUROSEMIDE 80 MG PO TABS: 80.0000 mg | ORAL_TABLET | Freq: Every day | ORAL | Status: DC

## 2010-09-15 NOTE — Progress Notes (Signed)
  Subjective:    Patient ID: Eric [facility]illo, male    DOB: 06-12-1961, 50 y.o.   MRN: [id number]  HPI Patient is a 50 year old male with a past medical history of hypertension and systolic heart failure with an ejection fraction of 50-55% presents for regular clinical followup. Patient was seen in the emergency department in January for exacerbation of his heart failure was found to be fluid overloaded.  His labs were all normal and at his baseline at that time. He was instructed to take his Lasix doubled the dose for 2 days and he improved. He reports since he's not had any trouble with shortness of breath increased leg swelling palpitations chest pain. He's been compliant with his medicines though he never did start taking aspirin. He does note that he thinks that his Lasix is not causing him to pee as much as it used to.  The patient was initially diagnosed with heart failure back in 2006 however he states that he has never had a stress test were seen a cardiologist for this. The patient denies any alcohol or drug use currently or in the past. He states that he is not sure why he's had his heart trouble. He states that he's been exercising regularly by going to the gym and riding the exercise bicycle. He is working hard to try to lose weight gradually and to take better care of himself. He seems very motivated.  No CP, SOB, abdominal pain, headaches, no increased legs swelling.  No dark or bloody stools.     Review of Systems  Constitutional: Negative for fever, chills, activity change, fatigue and unexpected weight change.  HENT: Negative.   Eyes: Negative for pain and visual disturbance.  Respiratory: Negative for cough, chest tightness and shortness of breath.   Cardiovascular: Negative for chest pain, palpitations and leg swelling.  Gastrointestinal: Negative for abdominal distention.  Genitourinary: Negative for dysuria and difficulty urinating.  Musculoskeletal: Negative for myalgias,  arthralgias and gait problem.  Neurological: Negative for dizziness and headaches.       Objective:   Physical Exam  Constitutional: He is oriented to person, place, and time. He appears well-developed and well-nourished. No distress.  HENT:  Head: Normocephalic and atraumatic.  Mouth/Throat: No oropharyngeal exudate.  Eyes: Conjunctivae and EOM are normal. Pupils are equal, round, and reactive to light.  Neck: Normal range of motion. Neck supple.  Cardiovascular: Normal rate, regular rhythm and intact distal pulses.   No murmur heard. Pulmonary/Chest: Effort normal and breath sounds normal. He has no wheezes.  Abdominal: Soft. Bowel sounds are normal. He exhibits no distension and no mass. There is no tenderness. There is no rebound and no guarding.  Musculoskeletal: Normal range of motion. He exhibits no edema and no tenderness.  Neurological: He is alert and oriented to person, place, and time. No cranial nerve deficit. Coordination normal.  Skin: Skin is warm and dry. No rash noted.  Psychiatric: He has a normal mood and affect. His behavior is normal. Judgment and thought content normal.          Assessment & Plan:

## 2010-09-15 NOTE — Assessment & Plan Note (Signed)
I would like to see the patient with better blood per her control as he has been borderline at goal for some time now.   Goal BP for  Eric Murillo is as low as he can be while remaining asymptomatic, but definitely better than ~140/80.  He is stable with regards to his heart failure with his furosemide. I do not want to modify this at this time. He also appears to be doing well with his ACE inhibitor. His creatinine is at the upper level abnormal however I think that this can be attributed to to his large amount of muscle mass than actual renal insufficiency.   Given that he is [place] and I will add Norvasc 5 mg today and have him return in 2 weeks for blood pressure check.

## 2010-09-15 NOTE — Assessment & Plan Note (Addendum)
The patient recently turned 50 years old and is therefore now due for colonoscopy. Of note he does state that her brother of his had polyps removed on colonoscopy. He states that these polyps were benign. We will make a referral for this today.

## 2010-09-15 NOTE — Patient Instructions (Signed)
You have been given a new blood pressure medicine called amlodipine (norvasc).  Take 1 tablet daily.   Please return in 2 weeks so that we can check how you are tolerating the new pill and to see how your blood pressure is doing. I want to have you return to see me in June for your regular check-up. Please take all of your medicines, including the aspirin, as directed. Stanton Kidney will contact you to schedule your stress test and colonoscopy.

## 2010-09-15 NOTE — Assessment & Plan Note (Signed)
The patient was diagnosed with this back in 2006 with an ejection fraction of 40-45%. With medical management the patient's ejection fraction improved to 50-55% per echocardiogram in 2008. The cause of the patient's heart failure is unknown and he has never had a stress test or seen a cardiologist. During her previous clinic visit he was noted to have frequent premature ventricular complexes on EKG. The patient currently is asymptomatic denying any chest pain shortness of breath palpitations or leg swelling. However I am concerned that we do not know the cause of his heart failure. He denies any family history of coronary artery disease. I would like to start by evaluating this with an outpatient stress test which we will order for today. The patient does deny any previous alcohol or drug use. He does have long-standing hypertension but that has been under pretty good control since 2008.

## 2010-09-15 NOTE — Assessment & Plan Note (Signed)
Patient states he has not tried Viagra as he was worried about his heart. He was reassured by getting a stress test to try to determine the cause of his mild systolic HF.

## 2010-09-15 NOTE — Assessment & Plan Note (Signed)
Improving.  Patient is actively trying to lose weight  with diet and exercise. I reinforced to him that his goal weight loss should be no more than one to 2 pounds a week.

## 2010-09-15 NOTE — Assessment & Plan Note (Addendum)
Patient is overdue for FLP and LFTs - last done in February 2011.  (at goal and nl LFTs at that time). These should be checked at next clinic visit.  Continue statin for now.

## 2010-09-16 NOTE — Progress Notes (Signed)
Pt aware of appt stress test with [facility] 09/21/10 2PM - info faxed.  Stanton Kidney Loriann Bosserman RN 09/16/10 11AM

## 2010-09-21 ENCOUNTER — Encounter: Payer: Self-pay | Admitting: Physician Assistant

## 2010-09-27 ENCOUNTER — Encounter: Payer: Self-pay | Admitting: Physician Assistant

## 2010-09-27 ENCOUNTER — Telehealth: Payer: Self-pay | Admitting: *Deleted

## 2010-09-27 NOTE — Telephone Encounter (Signed)
Received a call from Tereso Newcomer, [place] with cardiology group stating pt was in his office for stress test.   PA  wants Cardiologist to evaluate pt  before treadmill done.   He will set up  the appointment with cardiologist in his group. PA # [phone] for questions.

## 2010-09-30 DIAGNOSIS — I428 Other cardiomyopathies: Secondary | ICD-10-CM | POA: Insufficient documentation

## 2010-10-03 ENCOUNTER — Encounter: Payer: Self-pay | Admitting: Cardiology

## 2010-10-03 ENCOUNTER — Ambulatory Visit (HOSPITAL_COMMUNITY): Payer: Self-pay | Attending: Cardiology

## 2010-10-03 ENCOUNTER — Ambulatory Visit (INDEPENDENT_AMBULATORY_CARE_PROVIDER_SITE_OTHER): Payer: Self-pay | Admitting: Cardiology

## 2010-10-03 ENCOUNTER — Other Ambulatory Visit: Payer: Self-pay | Admitting: Cardiology

## 2010-10-03 DIAGNOSIS — I509 Heart failure, unspecified: Secondary | ICD-10-CM

## 2010-10-03 DIAGNOSIS — I2510 Atherosclerotic heart disease of native coronary artery without angina pectoris: Secondary | ICD-10-CM | POA: Insufficient documentation

## 2010-10-03 DIAGNOSIS — I059 Rheumatic mitral valve disease, unspecified: Secondary | ICD-10-CM | POA: Insufficient documentation

## 2010-10-03 LAB — BASIC METABOLIC PANEL WITH GFR
BUN: 17 mg/dL (ref 6–23)
CO2: 29 meq/L (ref 19–32)
Calcium: 9.1 mg/dL (ref 8.4–10.5)
Chloride: 103 meq/L (ref 96–112)
Creatinine, Ser: 1.5 mg/dL (ref 0.4–1.5)
GFR: 66.24 mL/min
Glucose, Bld: 99 mg/dL (ref 70–99)
Potassium: 4.1 meq/L (ref 3.5–5.1)
Sodium: 140 meq/L (ref 135–145)

## 2010-10-03 LAB — LIPID PANEL
Cholesterol: 142 mg/dL (ref 0–200)
HDL: 28.5 mg/dL — ABNORMAL LOW
LDL Cholesterol: 91 mg/dL (ref 0–99)
Total CHOL/HDL Ratio: 5
Triglycerides: 111 mg/dL (ref 0.0–149.0)
VLDL: 22.2 mg/dL (ref 0.0–40.0)

## 2010-10-03 LAB — HEPATIC FUNCTION PANEL
ALT: 19 U/L (ref 0–53)
AST: 26 U/L (ref 0–37)
Albumin: 4.4 g/dL (ref 3.5–5.2)
Alkaline Phosphatase: 66 U/L (ref 39–117)
Bilirubin, Direct: 0.1 mg/dL (ref 0.0–0.3)
Total Bilirubin: 0.7 mg/dL (ref 0.3–1.2)
Total Protein: 7.4 g/dL (ref 6.0–8.3)

## 2010-10-05 ENCOUNTER — Telehealth: Payer: Self-pay | Admitting: Cardiology

## 2010-10-05 ENCOUNTER — Encounter: Payer: Self-pay | Admitting: Cardiology

## 2010-10-05 ENCOUNTER — Other Ambulatory Visit: Payer: Self-pay

## 2010-10-06 ENCOUNTER — Encounter (INDEPENDENT_AMBULATORY_CARE_PROVIDER_SITE_OTHER): Payer: Self-pay | Admitting: *Deleted

## 2010-10-07 ENCOUNTER — Ambulatory Visit: Payer: Self-pay | Admitting: Internal Medicine

## 2010-10-07 ENCOUNTER — Other Ambulatory Visit (INDEPENDENT_AMBULATORY_CARE_PROVIDER_SITE_OTHER): Payer: Self-pay

## 2010-10-07 ENCOUNTER — Encounter: Payer: Self-pay | Admitting: Cardiology

## 2010-10-07 ENCOUNTER — Other Ambulatory Visit: Payer: Self-pay | Admitting: Cardiology

## 2010-10-07 DIAGNOSIS — I2510 Atherosclerotic heart disease of native coronary artery without angina pectoris: Secondary | ICD-10-CM

## 2010-10-07 DIAGNOSIS — R0602 Shortness of breath: Secondary | ICD-10-CM

## 2010-10-07 DIAGNOSIS — I509 Heart failure, unspecified: Secondary | ICD-10-CM

## 2010-10-07 LAB — BASIC METABOLIC PANEL
BUN: 21 mg/dL (ref 6–23)
CO2: 28 mEq/L (ref 19–32)
Calcium: 9 mg/dL (ref 8.4–10.5)
Chloride: 109 mEq/L (ref 96–112)
Creatinine, Ser: 1.4 mg/dL (ref 0.4–1.5)
GFR: 67.3 mL/min (ref >60.00)
Glucose, Bld: 76 mg/dL (ref 70–99)
Potassium: 3.5 mEq/L (ref 3.5–5.1)
Sodium: 143 mEq/L (ref 135–145)

## 2010-10-07 LAB — CBC WITH DIFFERENTIAL/PLATELET
Basophils Absolute: 0 10*3/uL (ref 0.0–0.1)
Basophils Relative: 0.3 % (ref 0.0–3.0)
Eosinophils Absolute: 0.3 10*3/uL (ref 0.0–0.7)
Eosinophils Relative: 2.6 % (ref 0.0–5.0)
HCT: 39 % (ref 39.0–52.0)
Hemoglobin: 12.9 g/dL — ABNORMAL LOW (ref 13.0–17.0)
Lymphocytes Relative: 32.9 % (ref 12.0–46.0)
Lymphs Abs: 3.2 10*3/uL (ref 0.7–4.0)
MCHC: 33.2 g/dL (ref 30.0–36.0)
MCV: 87.6 fl (ref 78.0–100.0)
Monocytes Absolute: 0.9 10*3/uL (ref 0.1–1.0)
Monocytes Relative: 9.6 % (ref 3.0–12.0)
Neutro Abs: 5.4 10*3/uL (ref 1.4–7.7)
Neutrophils Relative %: 54.6 % (ref 43.0–77.0)
Platelets: 176 10*3/uL (ref 150.0–400.0)
RBC: 4.45 Mil/uL (ref 4.22–5.81)
RDW: 13.6 % (ref 11.5–14.6)
WBC: 9.8 10*3/uL (ref 4.5–10.5)

## 2010-10-07 LAB — BRAIN NATRIURETIC PEPTIDE: Pro B Natriuretic peptide (BNP): 259.7 pg/mL — ABNORMAL HIGH (ref 0.0–100.0)

## 2010-10-07 LAB — PROTIME-INR
INR: 1.2 ratio — ABNORMAL HIGH (ref 0.8–1.0)
Prothrombin Time: 13 s — ABNORMAL HIGH (ref 10.2–12.4)

## 2010-10-08 ENCOUNTER — Encounter: Payer: Self-pay | Admitting: Cardiology

## 2010-10-11 ENCOUNTER — Telehealth: Payer: Self-pay | Admitting: Cardiology

## 2010-10-11 NOTE — Assessment & Plan Note (Signed)
Summary: :New patient eval for abnormal echo per hochrein.  patient gx...   Visit Type:  Initial Consult Primary Provider:  Danelle Berry, MD  CC:  ABNORMAL ECHO.  History of Present Illness: 50 yo with history of obesity, hyperlipidemia, HTN, and cardiomyopathy with CHF presents for cardiology evaluation.  Patient had an echo in 8/06 with EF 45%.  Repeat echo in 4/08 showed EF 50-55%.  He has had CHF and has been on Lasix for a long period now.  He has never been evaluated by cardiology.  In 1/12, he was seen in the ER for shortness of breath with mild exertion as well as associated chest tightness.  His Lasix was increased to 80 mg daily and he was sent home.  Since then, his shortness of breath seems to have significantly improved.  He exercises on a Stairmaster and exercise bike without any problems.  He is short of breath walking up a hill or a flight of steps.  He has not had any chest tightness since increasing his Lasix.  He has no past history of MI.  He has no family history of premature CAD.  He does not smoke or drink ETOH.  His BP is mildly elevated today at 142/86.    ECG: NSR, LAFB, ? old inferior MI, poor anterior R wave progression (? old anterior MI)  Labs (3/12): K 4.1, creatinine 1.5, LDL 91, HDL 29, LFTs normal  Current Medications (verified): 1)  Lasix 80 Mg Tabs (Furosemide) .... Take 1 Tablet By Mouth Once A Day 2)  Zocor 40 Mg Tabs (Simvastatin) .... Take 1 Pill By Mouth Daily. 3)  Aspirin 81 Mg Tabs (Aspirin) .... Take 1 Tablet By Mouth Daily 4)  Enalapril Maleate 20 Mg Tabs (Enalapril Maleate) .... Take 2 Tablets By Mouth Once A Day  Allergies: 1)  ! Beta Blockers  Past History:  Past Medical History: 1. CONGESTIVE HEART FAILURE UNSPECIFIED (ICD-428.0): Echo (8/06) EF 45%, mild MR.  Echo (4/08): EF 50-55%, mild MR.  No further workup.  2. ERECTILE DYSFUNCTION, ORGANIC (ICD-607.84) 3. HYPERTENSION (ICD-401.9) 4. CKD 5. DYSLIPIDEMIA (ICD-272.4) 6. Obesity 7.  Hyperlipidemia    Family History: Reviewed history from 03/03/2010 and no changes required. No history of cardiomyopathy No history of cancer among first degree relatives  father - died of MI (8s)  mother - HTN  Social History: Reviewed history from 03/03/2010 and no changes required. Lives with mother.  Unemployed, formerly a Film/video editor.  1 sexual partner (male), no contraceptives.  1 adult child.  tobacco - none alcohol - none drugs - none  Review of Systems       All systems reviewed and negative except as per HPI.   Vital Signs:  Patient profile:   50 year old male Height:      69.5 inches Weight:      309 pounds BMI:     45.14 Pulse rate:   95 / minute Pulse rhythm:   irregular Resp:     18 per minute BP sitting:   142 / 86  (left arm) Cuff size:   large  Vitals Entered By: Vikki Ports (October 03, 2010 8:35 AM)  Physical Exam  General:  Well developed, well nourished, in no acute distress.  Obese.  Head:  normocephalic and atraumatic Nose:  no deformity, discharge, inflammation, or lesions Mouth:  Teeth, gums and palate normal. Oral mucosa normal. Neck:  Neck supple, JVP 8 cm. No masses, thyromegaly or abnormal cervical nodes. Lungs:  Clear  bilaterally to auscultation and percussion. Heart:  Non-displaced PMI, chest non-tender; regular rate and rhythm, S1, S2 without murmurs, rubs or gallops. Carotid upstroke normal, no bruit. Pedals normal pulses. 1+ edema 1/2 up lower legs bilaterally.  Abdomen:  Bowel sounds positive; abdomen soft and non-tender without masses, organomegaly, or hernias noted. No hepatosplenomegaly. Extremities:  No clubbing or cyanosis. Neurologic:  Alert and oriented x 3. Skin:  Intact without lesions or rashes. Psych:  Normal affect.   Impression & Recommendations:  Problem # 1:  CONGESTIVE HEART FAILURE UNSPECIFIED (ICD-428.0) Patient was recently seen in the ER with CHF.  His Lasix was increased to 80 mg daily.  He has had a long  history of CHF, with EF 45% on 2006 echo and 50-55% on 2008 echo.  He has had no further workup and has had no ischemic evaluation.  He is mildly volume overloaded today.  Symptoms are probably NYHA class III but improved from 1/12.  He is only short of breath with stairs at this point.  I suggested that the best course for diagnosing the cause of his cardiomyopathy and CHF would be a right and left heart catheterization.  He is reluctant to undergo this.  Therefore, I will get an echocardiogram today.  If EF is significantly decreased, he is willing to undergo LHC/RHC.  If it is normal, will get ETT-myoview.  He will continue enalapril and I will start him on Coreg 6.25 mg two times a day.  Will get BNP/BMET today.   Problem # 2:  CORONARY ATHEROSCLEROSIS NATIVE CORONARY ARTERY (ICD-414.01) Abnormal ECG could be suggestive of CAD.  Also, patient has CHF of uncertain etiology and was having some chest tightness along with dyspnea when he went to the ER in 1/12.  As above, I think that the best course would be left and right heart cath.  If echo shows normal EF, I will get an ETT-myoview instead per my discussion with the patient.  He is on ASA and simvastatin.  Will have him continue this.  If we find evidence for CAD, will need to increase his statin strength for goal LDL < 70.   Other Orders: Echocardiogram (Echo) TLB-BMP (Basic Metabolic Panel-BMET) (80048-METABOL) TLB-Hepatic/Liver Function Pnl (80076-HEPATIC) TLB-Lipid Panel (80061-LIPID)  Patient Instructions: 1)  Your physician has recommended you make the following change in your medication:  2)  Start Coreg(carvedilol) 6.25mg  twice a day. 3)  Your physician recommends that you return for a FASTING lipid profile/liver profile/BMP today  414.01  428.0 4)  Your physician has requested that you have an echocardiogram.  Echocardiography is a painless test that uses sound waves to create images of your heart. It provides your doctor with  information about the size and shape of your heart and how well your heart's chambers and valves are working.  This procedure takes approximately one hour. There are no restrictions for this procedure. TODAY BETWEEN 10 AND 11AM. 5)  Your physician recommends that you schedule a follow-up appointment in: 2 weeks with Dr Shirlee Latch. Prescriptions: COREG 6.25 MG TABS (CARVEDILOL) one twice a day  #60 x 6   Entered by:   Katina Dung, RN, BSN   Authorized by:   [name], MD   Signed by:   Katina Dung, RN, BSN on 10/03/2010   Method used:   Faxed to ...       [facility] (retail)             [place], [place]  Ph:        Fax: [phone]   RxID:   [id number]

## 2010-10-11 NOTE — Telephone Encounter (Signed)
I talked with pt--pt given recent lab results done prior to cath 10/12/10

## 2010-10-11 NOTE — Letter (Signed)
Summary: Cardiac Catheterization Instructions- Main Lab  [employer], Main Office  [address]   [place], [place] [address]   Phone: [phone]  Fax: [phone]     10/05/2010 MRN: [id number]  Eric Murillo [address] [place], [place]  [address]  [place]  Dear [name], [place]   You are scheduled for Cardiac Catheterization on Wednesday March 21,2012             with Dr. [name].  Please arrive at the [facility] at 6:30      a.m. on the day of your procedure.  1. DIET     __x__ Nothing to eat or drink after midnight except your medications with a sip of water.  2. Come to the [place] office on  Friday March 16,2012 for lab work.  The lab at [facility] is open from 8:30 a.m. to 1:30 p.m. and 2:30 p.m. to 5:00 p.m.  The lab at [facility] is open from 7:30 a.m. to 5:30 p.m.  You do not have to be fasting.  3. MAKE SURE YOU TAKE YOUR ASPIRIN.  4. ___x__ DO NOT TAKE these medications before your procedure:         DO NOT TAKE LASIX(FUROSEMIDE) ON TUESDAY MORNING  MARCH 20 OR WEDNESDAY MORNING MARCH 21.      __x__ YOU MAY TAKE ALL of your remaining medications with a small amount of water.        5. Plan for one night stay - bring personal belongings (i.e. toothpaste, toothbrush, etc.)  6. Bring a current list of your medications and current insurance cards.  7. Must have a responsible person to drive you home.   8. Someone must be with you for the first 24 hours after you arrive home.  9. Please wear clothes that are easy to get on and off and wear slip-on shoes.  *Special note: Every effort is made to have your procedure done on time.  Occasionally there are emergencies that present themselves at the hospital that may cause delays.  Please be patient if a delay does occur.  If you have any questions after you get home, please call the office at the number listed above.  Katina Dung, RN,  BSN  Appended Document: Cardiac Catheterization Instructions- Main Lab    Clinical Lists Changes  Orders: Added new Referral order of Cardiac Catheterization (Cardiac Cath) - Signed

## 2010-10-11 NOTE — Letter (Signed)
Summary: Pre Visit Letter Revised  [facility]  [address] [place], [place] [address]   Phone: [phone]  Fax: [phone]        10/06/2010 MRN: [id number] Eric Murillo [address] [place], [place]  [address]  [place]             Procedure Date:  10/31/2010 @ 1:30   direct colon-Dr. Jarold Motto   Welcome to the Gastroenterology Division at [facility].    You are scheduled to see a nurse for your pre-procedure visit on 10/18/2010 at 10:30 on the 3rd floor at [facility], [address]  We ask that you try to arrive at our office 15 minutes prior to your appointment time to allow for check-in.  Please take a minute to review the attached form.  If you answer "Yes" to one or more of the questions on the first page, we ask that you call the person listed at your earliest opportunity.  If you answer "No" to all of the questions, please complete the rest of the form and bring it to your appointment.    Your nurse visit will consist of discussing your medical and surgical history, your immediate family medical history, and your medications.   If you are unable to list all of your medications on the form, please bring the medication bottles to your appointment and we will list them.  We will need to be aware of both prescribed and over the counter drugs.  We will need to know exact dosage information as well.    Please be prepared to read and sign documents such as consent forms, a financial agreement, and acknowledgement forms.  If necessary, and with your consent, a friend or relative is welcome to sit-in on the nurse visit with you.  Please bring your insurance card so that we may make a copy of it.  If your insurance requires a referral to see a specialist, please bring your referral form from your primary care physician.  No co-pay is required for this nurse visit.     If you cannot keep your appointment, please call [phone] to cancel or reschedule  prior to your appointment date.  This allows [place] the opportunity to schedule an appointment for another patient in need of care.    Thank you for choosing [facility] for your medical needs.  We appreciate the opportunity to care for you.  Please visit [place] at our website  to learn more about our practice.  Sincerely, The Gastroenterology Division

## 2010-10-11 NOTE — Progress Notes (Signed)
Summary: question re procedure  Phone Note Call from Patient Call back at Home Phone [phone] Call back at [phone]   Caller: Patient Reason for Call: Talk to Nurse Summary of Call: pt calling back re procedure next week. Initial call taken by: Roe Coombs,  October 05, 2010 9:18 AM  Follow-up for Phone Call        NA Katina Dung, RN, BSN  October 05, 2010 10:08 AM --I talked with patient given instructions for cath 10/12/10-pt to come for lab 10/07/10

## 2010-10-11 NOTE — Telephone Encounter (Signed)
I talked with pt --I was calling to give pt recent lab results--we had talked earlier

## 2010-10-12 ENCOUNTER — Inpatient Hospital Stay (HOSPITAL_BASED_OUTPATIENT_CLINIC_OR_DEPARTMENT_OTHER)
Admission: RE | Admit: 2010-10-12 | Discharge: 2010-10-14 | DRG: 287 | Disposition: A | Discharge status: Home / Self Care | Payer: Self-pay | Source: Ambulatory Visit | Attending: Cardiology | Admitting: Cardiology

## 2010-10-12 DIAGNOSIS — N529 Male erectile dysfunction, unspecified: Secondary | ICD-10-CM | POA: Diagnosis present

## 2010-10-12 DIAGNOSIS — I5023 Acute on chronic systolic (congestive) heart failure: Principal | ICD-10-CM | POA: Diagnosis present

## 2010-10-12 DIAGNOSIS — I509 Heart failure, unspecified: Secondary | ICD-10-CM | POA: Diagnosis present

## 2010-10-12 DIAGNOSIS — E669 Obesity, unspecified: Secondary | ICD-10-CM | POA: Diagnosis present

## 2010-10-12 DIAGNOSIS — I059 Rheumatic mitral valve disease, unspecified: Secondary | ICD-10-CM | POA: Diagnosis present

## 2010-10-12 DIAGNOSIS — I129 Hypertensive chronic kidney disease with stage 1 through stage 4 chronic kidney disease, or unspecified chronic kidney disease: Secondary | ICD-10-CM | POA: Diagnosis present

## 2010-10-12 DIAGNOSIS — Z7982 Long term (current) use of aspirin: Secondary | ICD-10-CM

## 2010-10-12 DIAGNOSIS — N183 Chronic kidney disease, stage 3 unspecified: Secondary | ICD-10-CM | POA: Diagnosis present

## 2010-10-12 DIAGNOSIS — M109 Gout, unspecified: Secondary | ICD-10-CM | POA: Diagnosis present

## 2010-10-12 DIAGNOSIS — I428 Other cardiomyopathies: Secondary | ICD-10-CM | POA: Diagnosis present

## 2010-10-12 DIAGNOSIS — E785 Hyperlipidemia, unspecified: Secondary | ICD-10-CM | POA: Diagnosis present

## 2010-10-12 LAB — CBC
MCH: 28 pg (ref 26.0–34.0)
MCV: 85.9 fL (ref 78.0–100.0)
Platelets: 184 10*3/uL (ref 150–400)
RBC: 4.4 MIL/uL (ref 4.22–5.81)
RDW: 12.8 % (ref 11.5–15.5)
WBC: 8.4 10*3/uL (ref 4.0–10.5)

## 2010-10-12 LAB — BASIC METABOLIC PANEL
BUN: 16 mg/dL (ref 6–23)
BUN: 15 mg/dL (ref 6–23)
CO2: 27 mEq/L (ref 19–32)
CO2: 28 mEq/L (ref 19–32)
Calcium: 8.6 mg/dL (ref 8.4–10.5)
Calcium: 9 mg/dL (ref 8.4–10.5)
Chloride: 107 mEq/L (ref 96–112)
Chloride: 105 mEq/L (ref 96–112)
Creatinine, Ser: 1.32 mg/dL (ref 0.4–1.5)
Creatinine, Ser: 1.44 mg/dL (ref 0.4–1.5)
GFR calc Af Amer: >60 mL/min (ref >60)
GFR calc Af Amer: >60 mL/min (ref >60)
Glucose, Bld: 91 mg/dL (ref 70–99)

## 2010-10-12 LAB — BRAIN NATRIURETIC PEPTIDE: Pro B Natriuretic peptide (BNP): 280 pg/mL — ABNORMAL HIGH (ref 0.0–100.0)

## 2010-10-12 LAB — RAPID STREP SCREEN (MED CTR MEBANE ONLY): Streptococcus, Group A Screen (Direct): POSITIVE — AB

## 2010-10-13 ENCOUNTER — Inpatient Hospital Stay (HOSPITAL_COMMUNITY): Payer: Self-pay

## 2010-10-13 DIAGNOSIS — I5023 Acute on chronic systolic (congestive) heart failure: Secondary | ICD-10-CM

## 2010-10-13 LAB — CBC
MCH: 28.3 pg (ref 26.0–34.0)
MCHC: 33.1 g/dL (ref 30.0–36.0)
MCV: 85.5 fL (ref 78.0–100.0)
Platelets: 219 10*3/uL (ref 150–400)
RDW: 12.8 % (ref 11.5–15.5)

## 2010-10-13 LAB — BASIC METABOLIC PANEL
BUN: 18 mg/dL (ref 6–23)
BUN: 20 mg/dL (ref 6–23)
Calcium: 8.7 mg/dL (ref 8.4–10.5)
Calcium: 9 mg/dL (ref 8.4–10.5)
Chloride: 107 mEq/L (ref 96–112)
Chloride: 106 mEq/L (ref 96–112)
Creatinine, Ser: 1.57 mg/dL — ABNORMAL HIGH (ref 0.4–1.5)
Creatinine, Ser: 1.91 mg/dL — ABNORMAL HIGH (ref 0.4–1.5)
GFR calc Af Amer: 57 mL/min — ABNORMAL LOW (ref >60)
GFR calc Af Amer: 45 mL/min — ABNORMAL LOW (ref >60)
GFR calc non Af Amer: 47 mL/min — ABNORMAL LOW (ref >60)

## 2010-10-13 LAB — BRAIN NATRIURETIC PEPTIDE: Pro B Natriuretic peptide (BNP): 1411 pg/mL — ABNORMAL HIGH (ref 0.0–100.0)

## 2010-10-14 ENCOUNTER — Other Ambulatory Visit: Payer: Self-pay | Admitting: *Deleted

## 2010-10-14 LAB — CBC
MCV: 85.4 fL (ref 78.0–100.0)
Platelets: 209 10*3/uL (ref 150–400)
RBC: 4.99 MIL/uL (ref 4.22–5.81)
RDW: 13 % (ref 11.5–15.5)
WBC: 10.9 10*3/uL — ABNORMAL HIGH (ref 4.0–10.5)

## 2010-10-14 LAB — BASIC METABOLIC PANEL
BUN: 18 mg/dL (ref 6–23)
Creatinine, Ser: 1.59 mg/dL — ABNORMAL HIGH (ref 0.4–1.5)
GFR calc Af Amer: 56 mL/min — ABNORMAL LOW (ref >60)
GFR calc non Af Amer: 46 mL/min — ABNORMAL LOW (ref >60)
Potassium: 3.6 mEq/L (ref 3.5–5.1)

## 2010-10-14 NOTE — Procedures (Signed)
  Eric Murillo, STRAUCH NO.:  [id number]  MEDICAL RECORD NO.:  [id number]           PATIENT TYPE:  I  LOCATION:  2921                         FACILITY:  [facility]  PHYSICIAN:  [name], MD      DATE OF BIRTH:  1960-10-17  DATE OF PROCEDURE:  10/12/2010 DATE OF DISCHARGE:                           CARDIAC CATHETERIZATION   PROCEDURES: 1. Left heart catheterization. 2. Coronary angiography. 3. Right heart catheterization.  INDICATION:  This is a 50 year old who found to have EF of 30-35% by echo.  He is volume overloaded on exam.  Right and left heart catheterization was done today to assess LV and RV filling pressures and to look for coronary disease as cause of his cardiomyopathy.  PROCEDURE NOTE:  After informed consent was obtained, the right groin was sterilely prepped and draped.  A 1% lidocaine was used to locally anesthetize the right groin area.  The right common femoral vein was entered using modified Seldinger technique and a 7-French venous sheath was placed.  The right common femoral artery was then accessed using modified Seldinger technique and a 5-French arterial sheath was placed. The right heart catheterization was carried out using a balloon-tip Swan- Ganz catheter.  Samples were removed for oxygen saturation from the PA and from the aorta.  The left and right coronary arteries were then engaged using the MP catheter and the left ventricle was entered using MP catheter and no known complications.  FINDINGS: 1. No left ventriculogram was done due to elevated left ventricular     end-diastolic pressure and elevated creatinine. 2. Coronary system is right dominant.  There was no angiographic     coronary disease. 3. Hemodynamics:  Mean right atrial pressure 20 mmHg, RV 74/25, PA     76/41 with mean PA pressure 56 mmHg, mean pulmonary capillary wedge     pressure 38 mmHg, LV 125/41, aorta 132/94.  Cardiac output 5.22     liters per minute.   Cardiac index 2.1.  Aortic saturation is 80%,     PA saturation is 52%.  Pulmonary vascular resistance 3.4 Wood     units.  IMPRESSION:  The patient has normal coronaries.  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  His ejection fraction is noted to be 30- 35%.  He has very elevated left and right heart filling pressures.  We will admit for nesiritide drip and diuresis.     [name], MD     DM/MEDQ  D:  10/12/2010  T:  10/13/2010  Job:  [id number]  cc:   Danelle Berry, MD  Electronically Signed by [name] MD on 10/14/2010 08:36:59 AM

## 2010-10-18 ENCOUNTER — Telehealth: Payer: Self-pay | Admitting: Gastroenterology

## 2010-10-18 ENCOUNTER — Ambulatory Visit (AMBULATORY_SURGERY_CENTER): Payer: Self-pay

## 2010-10-18 VITALS — Ht 69.5 in | Wt 302.8 lb

## 2010-10-18 DIAGNOSIS — Z139 Encounter for screening, unspecified: Secondary | ICD-10-CM

## 2010-10-18 NOTE — Telephone Encounter (Signed)
Pt. Will pick up prep at front desk

## 2010-10-19 ENCOUNTER — Encounter: Payer: Self-pay | Admitting: Internal Medicine

## 2010-10-19 ENCOUNTER — Ambulatory Visit: Payer: Self-pay | Admitting: Cardiology

## 2010-10-20 NOTE — Letter (Signed)
Summary: [facility]  [facility]   Imported By: Marylou Mccoy 10/10/2010 11:25:21  _____________________________________________________________________  External Attachment:    Type:   Image     Comment:   External Document

## 2010-10-27 ENCOUNTER — Encounter: Payer: Self-pay | Admitting: *Deleted

## 2010-10-31 ENCOUNTER — Encounter: Payer: Self-pay | Admitting: Gastroenterology

## 2010-10-31 ENCOUNTER — Other Ambulatory Visit: Payer: Self-pay | Admitting: Gastroenterology

## 2010-10-31 ENCOUNTER — Ambulatory Visit (AMBULATORY_SURGERY_CENTER): Payer: Self-pay | Admitting: Gastroenterology

## 2010-10-31 VITALS — BP 132/80 | HR 84 | Temp 98.3°F | Resp 18 | Ht 69.5 in | Wt 298.0 lb

## 2010-10-31 DIAGNOSIS — Z1211 Encounter for screening for malignant neoplasm of colon: Secondary | ICD-10-CM

## 2010-10-31 MED ORDER — SODIUM CHLORIDE 0.9 % IV SOLN: 500.0000 mL | INTRAVENOUS | Status: DC

## 2010-10-31 NOTE — Patient Instructions (Signed)
Discharge instructions given with verbal understanding. Instructed to resume previous medications.

## 2010-11-01 ENCOUNTER — Telehealth: Payer: Self-pay | Admitting: *Deleted

## 2010-11-01 NOTE — Telephone Encounter (Signed)
Called number given by pt [phone] with no answer. Called home number and his mother states pt not home at this time. ewm rn

## 2010-11-07 ENCOUNTER — Ambulatory Visit (INDEPENDENT_AMBULATORY_CARE_PROVIDER_SITE_OTHER): Payer: Self-pay | Admitting: Internal Medicine

## 2010-11-07 ENCOUNTER — Encounter: Payer: Self-pay | Admitting: Internal Medicine

## 2010-11-07 DIAGNOSIS — I509 Heart failure, unspecified: Secondary | ICD-10-CM

## 2010-11-07 DIAGNOSIS — I5020 Unspecified systolic (congestive) heart failure: Secondary | ICD-10-CM

## 2010-11-07 DIAGNOSIS — E785 Hyperlipidemia, unspecified: Secondary | ICD-10-CM

## 2010-11-07 DIAGNOSIS — Z0000 Encounter for general adult medical examination without abnormal findings: Secondary | ICD-10-CM

## 2010-11-07 DIAGNOSIS — I499 Cardiac arrhythmia, unspecified: Secondary | ICD-10-CM

## 2010-11-07 DIAGNOSIS — N529 Male erectile dysfunction, unspecified: Secondary | ICD-10-CM

## 2010-11-07 DIAGNOSIS — E663 Overweight: Secondary | ICD-10-CM

## 2010-11-07 DIAGNOSIS — N259 Disorder resulting from impaired renal tubular function, unspecified: Secondary | ICD-10-CM

## 2010-11-07 DIAGNOSIS — I10 Essential (primary) hypertension: Secondary | ICD-10-CM

## 2010-11-07 MED ORDER — FUROSEMIDE 40 MG PO TABS: 40.0000 mg | ORAL_TABLET | Freq: Four times a day (QID) | ORAL | Status: DC

## 2010-11-07 NOTE — Patient Instructions (Signed)
Increased your lasix dose to four times daily for next 4 days. Follow up with your heart doctor as scheduled. Change your lasix dose as per their advice thereafter. Strict low salt diet is required for you. Avoid chips and other junk foods.

## 2010-11-07 NOTE — Progress Notes (Signed)
  Subjective:    Patient ID: Eric Murillo, male    DOB: 11/08/1960, 50 y.o.   MRN: [id number]  HPI  Patient is a 50 year old male with a past medical history of hypertension and systolic heart failure with an ejection fraction of 50-55% presents for clinical followup. Patient was seen by cardiologist recently, and evaluated for cardiac ischemia. He is now started on carvedilol and asked not to take norvasc which was newly prescribed BP med. Patient reports that he has not been compliant with low salt diet and has leg edema. He denies SOB and Chest pain at this time. He has been taking total of 120 mg of lasix per day. His previous dose has varied from 40 mg to 80 mg. He has gained 12 lbs since last visit.   No CP, SOB, abdominal pain, headaches, No dark or bloody stools.     Review of Systems  Constitutional: Negative for fever, chills, activity change and fatigue.  HENT: Negative.   Eyes: Negative for pain and visual disturbance.  Respiratory: Negative for cough, chest tightness and shortness of breath.   Cardiovascular: Negative for chest pain, palpitations and leg swelling.  Gastrointestinal: Negative for abdominal distention.  Genitourinary: Negative for dysuria and difficulty urinating.  Musculoskeletal: Negative for myalgias, arthralgias and gait problem.  Neurological: Negative for dizziness and headaches.       Objective:   Physical Exam  Constitutional: He is oriented to person, place, and time. He appears well-developed and well-nourished. No distress.  HENT:  Head: Normocephalic and atraumatic.  Mouth/Throat: No oropharyngeal exudate.  Eyes: Conjunctivae and EOM are normal. Pupils are equal, round, and reactive to light.  Neck: Normal range of motion. Neck supple.  Cardiovascular: Normal rate, regular rhythm and intact distal pulses.   No murmur heard. Pulmonary/Chest: Effort normal and breath sounds normal. He has no wheezes.  Abdominal: Soft. Bowel sounds are normal.  He exhibits no distension and no mass. There is no tenderness. There is no rebound and no guarding.  Musculoskeletal: Normal range of motion. He exhibits edema. He exhibits no tenderness.  Neurological: He is alert and oriented to person, place, and time. No cranial nerve deficit. Coordination normal.  Skin: Skin is warm and dry. No rash noted.  Psychiatric: He has a normal mood and affect. His behavior is normal. Judgment and thought content normal.          Assessment & Plan:

## 2010-11-07 NOTE — Discharge Summary (Signed)
NAME:  Eric Murillo, Eric Murillo NO.:  [id number]  MEDICAL RECORD NO.:  [id number]           PATIENT TYPE:  I  LOCATION:  2921                         FACILITY:  [facility]  PHYSICIAN:  [name], MD      DATE OF BIRTH:  1961/06/18  DATE OF ADMISSION:  10/12/2010 DATE OF DISCHARGE:  10/14/2010                              DISCHARGE SUMMARY   PRIMARY CARDIOLOGIST:  [name], MD  PRIMARY CARE PHYSICIAN:  Danelle Berry, MD  DISCHARGE DIAGNOSES: 1. Acute on chronic systolic congestive heart failure.     a.     Right and left cardiac catheterization October 12, 2010:      Patent coronary arteries,  suspected cardiomyopathy as      nonischemic, EF recently 30- 35% (no LV gram secondary to renal      insufficiency), very elevated left and right heart filling      pressures.     b.     Quick response to nesiritide and IV torsemide (see discharge      med section for changes to preadmission meds).     c.     Early followup with primary cardiologist with BMET on October 19, 2010 at 10:30 a.m.     d.     Weight on admission 140 kg, weight on discharge 135.3 kg. 2. Gout flare.     a.     Colchicine added on p.r.n. basis to preadmission meds.  SECONDARY DIAGNOSES: 1. Hypertension (low normal/hypotensive in hospital in this     admission). 2. Chronic kidney disease stage III. 3. Obesity. 4. Dyslipidemia. 5. Erectile dysfunction, organic.  ALLERGIES AND INTOLERANCES:  BETA BLOCKERS (tolerates carvedilol).  PROCEDURES: 1. Right and left cardiac catheterization October 12, 2010:  No     angiographic evidence of coronary artery disease.  Mean right     atrial pressure 20 mmHg,  RV 74/25, PA 76/41 with mean PA pressure     of 56 mmHg, mean PCWP 38 mmHg, LV 125/41, aorta 132/94, cardiac     output 5.22 liters per minute, cardiac index 2.1, aortic saturation     80%, PA saturation 52%, pulmonary vascular resistance 3.4 Wood     units. 2. Chest x-ray October 13, 2010:  Space  stable chest x-ray.  No active     lung disease.  HISTORY OF PRESENT ILLNESS:  Eric Murillo is a 50 year old African American gentleman who previously was noted to have a decreased ejection fraction of unknown etiology, was seen in the office on October 03, 2010, and planned for cardiac catheterization if repeat echo showed continued depressed LV function versus stress Myoview with LVEF if normalized. Repeat 2-D echocardiogram did show LVEF of 30-35% and he was scheduled for diagnostic right and left cardiac catheterization on October 12, 2010, and presented for that procedure as scheduled.  HOSPITAL COURSE:  The patient was admitted and underwent procedures as described above.  Secondary to significant right and left ventricular filling pressures, the patient was admitted for acute on chronic systolic heart failure likely secondary to nonischemic cardiomyopathy. The patient responded quickly to  nesiritide and IV torsemide 80 mg b.i.d., and his ACE inhibitor therapy was adjusted given some low blood pressures in the hospital down into the 80s systolic.  Please see discharge med section for final discharge medications.  The patient also had gout flare on the day of his discharge and was given prescription for colchicine 0.6 daily p.r.n.  Due to concern over bounce-back, the patient is being set up for early followup with a [facility] at [facility] with a early followup with primary cardiologist with a BMET on October 19, 2010, at 9:30 a.m.  At the time of discharge, the patient received his new medication list, prescriptions, followup instructions, and post-cath instructions.  All questions and concerns were addressed prior to leaving the hospital.  DISCHARGE LABS:  WBC is 10.9, HGB 14.2, HCT 42.6, PLT count 209.  Sodium 139, potassium 3.6, chloride 103, bicarb 28, BUN 18, creatinine 1.59, glucose 117, calcium 8.8.  BNP on admission 1411.  Total cholesterol 142, triglyceride 111, HDL 28.5,  LDL 91, total cholesterol/HDL ratio 5.0.  FOLLOWUP PLANS AND APPOINTMENTS:  Dr. Shirlee Latch and BMET at [facility] October 19, 2010, at 9:30 a.m.  DISCHARGE MEDICATIONS: 1. Potassium chloride 20 mEq p.o. daily. 2. Colchicine 0.6 mg p.o. daily. 3. Enalapril 10 mg p.o. b.i.d. 4. Furosemide 40 mg 1-2 tablets p.o. b.i.d. (2 tablets q.a.m., 1     tablet q.p.m.). 5. Carvedilol 6.25 mg 1 tablet p.o. b.i.d. 6. Simvastatin 40 mg 1 tablet p.o. daily. 7. Aspirin 81 mg 1 tablet p.o. daily.  Duration of discharge encounter including physician time was 35 minutes.     Eric Murillo, PAC   ______________________________ [name], MD    MS/MEDQ  D:  10/14/2010  T:  10/15/2010  Job:  [id number]  cc:   Danelle Berry, MD  Electronically Signed by Eric Murillo PAC on 10/22/2010 02:48:32 PM Electronically Signed by [name] MD on 11/07/2010 09:00:41 AM

## 2010-11-07 NOTE — Assessment & Plan Note (Signed)
Patient has gained 12 pounds and has progressive edema in lower extremity. Patient is taking 120 mg of Lasix. I will increase that to 160. This will be ordered for next 4 days. He will follow with cardiologist on Friday.

## 2010-11-07 NOTE — Assessment & Plan Note (Signed)
No change in urination. Patient is experiencing lower extremity edema which likely is only secondary to congestive heart failure. Recheck renal function in one week as I have increased the dose of Lasix.

## 2010-11-07 NOTE — Assessment & Plan Note (Signed)
His blood pressure is well controlled now. He is now taking carvedilol without any side effects. Continue current regimen

## 2010-11-18 ENCOUNTER — Encounter: Payer: Self-pay | Admitting: Cardiology

## 2010-11-18 ENCOUNTER — Ambulatory Visit (INDEPENDENT_AMBULATORY_CARE_PROVIDER_SITE_OTHER): Payer: Self-pay | Admitting: Cardiology

## 2010-11-18 VITALS — BP 126/80 | HR 84 | Ht 69.0 in | Wt 309.5 lb

## 2010-11-18 DIAGNOSIS — I2510 Atherosclerotic heart disease of native coronary artery without angina pectoris: Secondary | ICD-10-CM

## 2010-11-18 DIAGNOSIS — R0609 Other forms of dyspnea: Secondary | ICD-10-CM

## 2010-11-18 DIAGNOSIS — R0989 Other specified symptoms and signs involving the circulatory and respiratory systems: Secondary | ICD-10-CM

## 2010-11-18 DIAGNOSIS — E785 Hyperlipidemia, unspecified: Secondary | ICD-10-CM

## 2010-11-18 DIAGNOSIS — I5022 Chronic systolic (congestive) heart failure: Secondary | ICD-10-CM

## 2010-11-18 DIAGNOSIS — I5020 Unspecified systolic (congestive) heart failure: Secondary | ICD-10-CM

## 2010-11-18 LAB — BASIC METABOLIC PANEL
BUN: 19 mg/dL (ref 6–23)
Chloride: 106 mEq/L (ref 96–112)
Potassium: 4.1 mEq/L (ref 3.5–5.1)

## 2010-11-18 MED ORDER — CARVEDILOL 12.5 MG PO TABS: 12.5000 mg | ORAL_TABLET | Freq: Two times a day (BID) | ORAL | Status: DC

## 2010-11-18 NOTE — Patient Instructions (Addendum)
Increase Coreg (carvedilol) to 9.375mg  twice a day for 3 days, then increase Coreg(carvedilol) to 12.5mg  twice a day. You can take 6.25mg  one and one-half twice a day for 3 days, then take two 6.25mg  twice a day.  Change Lasix(furosemide) to 80mg  in the morning and 40mg  in the afternoon.  This will be two 40mg  tablets in the morning and one tablet in the afternoon.  Lab today--BMP/BNP/SPEP/TSH/ANA/HIV  428.22  Schedule an appointment to see Dr Shirlee Latch in 1 month.  Schedule an appointment for an echocardiogram in September.

## 2010-11-20 NOTE — Assessment & Plan Note (Signed)
Lipids ok when checked in 3/12.

## 2010-11-20 NOTE — Progress Notes (Signed)
PCP: Dr. Claudette Laws  50 yo with history of obesity, hyperlipidemia, HTN, and nonischemic cardiomyopathy with CHF returns for cardiology evaluation.  Since last appointment, he had an echo in 3/12 showing EF 30-35% with mild dilation and global hypokinesis.  Right and left heart catheterization was done in 3/12, showing no angiographic CAD and elevated left and right heart filling pressure.  Patient was admitted for diuresis.  He was treated with a nesiritide gtt and Lasix, with good response.  Since getting home again, he has been breathing better with less dyspnea.  He can walk on flat ground without shortness of breath and has mild shortness of breath walking up a flight of steps.  He has been taking Lasix 120 mg qam rather than 80 qam, 40 qpm.  Weight is stable at 309.  Patient states that he lost weight with treatment initially but has been eating more lately and gained weight back again.    ECG: NSR, LAFB, PAC  Labs (3/12): K 4.1, creatinine 1.5, LDL 91, HDL 29, LFTs normal, BNP 260, HCT 43  Allergies:  1)  ! Beta Blockers  Past Medical History: 1. Nonischemic cardiomyopathy: Echo (3/12) with EF 30-35% and mildly dilated LV, diffuse LV hypokinesis, moderate MR, PA systolic pressure 55 mmHg.  Left and right heart cath (3/12): No angiographic CAD; mean RA 20, PA 76/41, mean PCWP 38, CI 2.1.   2. ERECTILE DYSFUNCTION, ORGANIC (ICD-607.84) 3. HYPERTENSION (ICD-401.9) 4. CKD 5. DYSLIPIDEMIA (ICD-272.4) 6. Obesity 7. Hyperlipidemia    Family History: No history of cardiomyopathy No history of cancer among first degree relatives  father - died of MI (105s)  mother - HTN  Social History: Lives with mother.  Unemployed, formerly a Film/video editor.  1 sexual partner (male), no contraceptives.  1 adult child. tobacco - none alcohol - none drugs - none  Review of Systems        All systems reviewed and negative except as per HPI.   Current Outpatient Prescriptions  Medication Sig Dispense  Refill  . aspirin 81 MG tablet Take 1 tablet (81 mg total) by mouth daily.  30 tablet  11  . enalapril (VASOTEC) 20 MG tablet Take 2 tablets (40 mg total) by mouth daily.  60 tablet  11  . potassium chloride SA (K-DUR,KLOR-CON) 20 MEQ tablet Take 20 mEq by mouth daily.        . simvastatin (ZOCOR) 40 MG tablet Take 1 tablet (40 mg total) by mouth at bedtime.  30 tablet  11  . carvedilol (COREG) 12.5 MG tablet Take 1 tablet (12.5 mg total) by mouth 2 (two) times daily.  60 tablet  11  . furosemide (LASIX) 40 MG tablet Take 1 tablet (40 mg total) by mouth 4 (four) times daily.  16 tablet  0  . furosemide (LASIX) 40 MG tablet Take 2 tablets in the morning and 1 tablet in the afternoon.      . sildenafil (VIAGRA) 50 MG tablet Take 1 tablet by mouth  as needed for sexual activity        Current Facility-Administered Medications  Medication Dose Route Frequency Provider Last Rate Last Dose  . 0.9 %  sodium chloride infusion  500 mL Intravenous Continuous Sheryn Bison, MD        BP 126/80  Pulse 84  Ht 5\' 9"  (1.753 m)  Wt 309 lb 8 oz (140.388 kg)  BMI 45.71 kg/m2 General: NAD, obese.  Neck: JVP 8 cm, no thyromegaly or thyroid nodule.  Lungs:  Clear to auscultation bilaterally with normal respiratory effort. CV: Nondisplaced PMI.  Heart regular S1/S2, no S3/S4, 1/6 HSM.  No peripheral edema.  No carotid bruit.  Normal pedal pulses.  Abdomen: Soft, nontender, no hepatosplenomegaly, no distention.  Neurologic: Alert and oriented x 3.  Psych: Normal affect. Extremities: No clubbing or cyanosis.

## 2010-11-20 NOTE — Assessment & Plan Note (Signed)
Nonischemic cardiomyopathy with EF 30-35% by echo and no angiographic CAD by recent cath.  He appears mildly volume overloaded at most with NYHA class II symptoms now.   - Change Lasix dosing back to 80 mg qam, 40 mg qpm. - Continue enalapril 40 mg daily - Increase Coreg to 12.5 mg bid.  - We discussed Bidil but he does not think that he would be compliant with a three times a day medication.   - Echo in 9/12 to reassess EF (? ICD).   - Check BMET, BNP, TSH, SPEP, ANA, and HIV.

## 2010-11-21 LAB — HIV-1 RNA QUANT-NO REFLEX-BLD

## 2010-11-22 LAB — PROTEIN ELECTROPHORESIS, SERUM
Albumin ELP: 57.3 % (ref 55.8–66.1)
Alpha-1-Globulin: 3.7 % (ref 2.9–4.9)

## 2010-11-28 ENCOUNTER — Telehealth: Payer: Self-pay | Admitting: Cardiology

## 2010-11-28 NOTE — Telephone Encounter (Signed)
I spoke with the pt and he will come into the office on Thursday for lab work to follow-up on HIV testing.

## 2010-11-28 NOTE — Telephone Encounter (Signed)
I spoke with Eric Murillo in the lab and made him aware that the pt would be coming into the office on Thursday for repeat labs. Eric Murillo will place order into the system for pt and he said that he would do this when the pt came into the office.

## 2010-11-28 NOTE — Telephone Encounter (Signed)
Per pt calling, returning called to Katina Dung on Friday.

## 2010-11-29 ENCOUNTER — Other Ambulatory Visit: Payer: Self-pay | Admitting: Cardiology

## 2010-11-29 ENCOUNTER — Other Ambulatory Visit (INDEPENDENT_AMBULATORY_CARE_PROVIDER_SITE_OTHER): Payer: Self-pay | Admitting: *Deleted

## 2010-11-29 DIAGNOSIS — I509 Heart failure, unspecified: Secondary | ICD-10-CM

## 2010-11-30 LAB — HIV-1 RNA QUANT-NO REFLEX-BLD
HIV 1 RNA Quant: <20 copies/mL (ref <20)
HIV-1 RNA Quant, Log: <1.3 {Log} (ref <1.30)

## 2010-12-01 ENCOUNTER — Other Ambulatory Visit: Payer: Self-pay | Admitting: *Deleted

## 2010-12-09 ENCOUNTER — Encounter: Payer: Self-pay | Admitting: Internal Medicine

## 2010-12-21 ENCOUNTER — Telehealth: Payer: Self-pay | Admitting: Cardiology

## 2010-12-21 ENCOUNTER — Ambulatory Visit (INDEPENDENT_AMBULATORY_CARE_PROVIDER_SITE_OTHER): Payer: Self-pay | Admitting: Cardiology

## 2010-12-21 ENCOUNTER — Encounter: Payer: Self-pay | Admitting: Cardiology

## 2010-12-21 VITALS — BP 131/87 | HR 81 | Resp 18 | Ht 69.0 in | Wt 309.8 lb

## 2010-12-21 DIAGNOSIS — I5020 Unspecified systolic (congestive) heart failure: Secondary | ICD-10-CM

## 2010-12-21 DIAGNOSIS — I5022 Chronic systolic (congestive) heart failure: Secondary | ICD-10-CM

## 2010-12-21 MED ORDER — CARVEDILOL 12.5 MG PO TABS: ORAL_TABLET | ORAL | Status: DC

## 2010-12-21 MED ORDER — SPIRONOLACTONE 12.5 MG HALF TABLET: 12.5000 mg | ORAL_TABLET | Freq: Every day | ORAL | Status: DC

## 2010-12-21 NOTE — Telephone Encounter (Signed)
[facility] and [facility] that the patient should be on spironolactone 12.5mg  once daily. The way the sig is defaults in EPIC looks like the patient should take a 1/2 of a 12.5mg  tablet.

## 2010-12-21 NOTE — Telephone Encounter (Signed)
Has a question regarding Rx for Aldactone written today

## 2010-12-21 NOTE — Telephone Encounter (Signed)
They have questions regarding Rx written today for Aldactone

## 2010-12-21 NOTE — Patient Instructions (Signed)
Your physician has recommended you make the following change in your medication:  1) Increase carvediolol to 12.5mg  1 & 1/2 tablets twice daily 2) Start spironolactone 12.5mg  once daily.  Your physician recommends that you return for lab work in: 10 days- bmet (428.22).  Your physician has requested that you have an echocardiogram in September 2012. Echocardiography is a painless test that uses sound waves to create images of your heart. It provides your doctor with information about the size and shape of your heart and how well your heart's chambers and valves are working. This procedure takes approximately one hour. There are no restrictions for this procedure.  Your physician recommends that you schedule a follow-up appointment in: 2 months.

## 2010-12-22 NOTE — Progress Notes (Signed)
PCP: Dr. Claudette Laws  50 yo with history of obesity, hyperlipidemia, HTN, and nonischemic cardiomyopathy with CHF returns for cardiology evaluation.  He had an echo in 3/12 showing EF 30-35% with mild dilation and global hypokinesis.  Right and left heart catheterization was done in 3/12, showing no angiographic CAD and elevated left and right heart filling pressure.  Patient was admitted for diuresis.  He was treated with a nesiritide gtt and Lasix, with good response.    Since I last saw him, he has been doing reasonably well.  He has been walking for a mile on a track several times a week.  By the time he finishes a mile, he is fatigued.  He works out at [employer] on days he does not walk and does about 30 minutes on an elliptical machine.  Weight is stable.    Labs (3/12): K 4.1, creatinine 1.5, LDL 91, HDL 29, LFTs normal, BNP 260, HCT 43 Labs (4/12): SPEP negative, TSH normal, ANA negative, BNP 164, K 4.1, creatinine 1.5 Labs (5/12): HIV negative  Allergies:  1)  ! Beta Blockers  Past Medical History: 1. Nonischemic cardiomyopathy: Echo (3/12) with EF 30-35% and mildly dilated LV, diffuse LV hypokinesis, moderate MR, PA systolic pressure 55 mmHg.  Left and right heart cath (3/12): No angiographic CAD; mean RA 20, PA 76/41, mean PCWP 38, CI 2.1.  SPEP negative, TSH normal, ANA negative.  He has never been a heavy drinker.  Cardiomyopathy may be due to long history of HTN.  2. ERECTILE DYSFUNCTION, ORGANIC (ICD-607.84) 3. HYPERTENSION (ICD-401.9) 4. CKD 5. DYSLIPIDEMIA (ICD-272.4) 6. Obesity 7. Hyperlipidemia    Family History: No history of cardiomyopathy No history of cancer among first degree relatives  father - died of MI (14s)  mother - HTN  Social History: Lives with mother.  Unemployed, formerly a Film/video editor.  1 sexual partner (male), no contraceptives.  1 adult child. tobacco - none alcohol - none drugs - none  Review of Systems        All systems reviewed and negative  except as per HPI.   Current Outpatient Prescriptions  Medication Sig Dispense Refill  . aspirin 81 MG tablet Take 1 tablet (81 mg total) by mouth daily.  30 tablet  11  . enalapril (VASOTEC) 20 MG tablet Take 2 tablets (40 mg total) by mouth daily.  60 tablet  11  . furosemide (LASIX) 40 MG tablet Take 2 tablets in the morning and 1 tablet in the afternoon.      . potassium chloride SA (K-DUR,KLOR-CON) 20 MEQ tablet Take 20 mEq by mouth daily.        . sildenafil (VIAGRA) 50 MG tablet Take 1 tablet by mouth  as needed for sexual activity       . simvastatin (ZOCOR) 40 MG tablet Take 1 tablet (40 mg total) by mouth at bedtime.  30 tablet  11  . carvedilol (COREG) 12.5 MG tablet Take 1 & 1/2 tablets by mouth twice daily.  90 tablet  11  . furosemide (LASIX) 40 MG tablet Take 1 tablet (40 mg total) by mouth 4 (four) times daily.  16 tablet  0  . spironolactone (ALDACTONE) 12.5 mg TABS Take 0.5 tablets (12.5 mg total) by mouth daily.  30 tablet  6   Current Facility-Administered Medications  Medication Dose Route Frequency Provider Last Rate Last Dose  . 0.9 %  sodium chloride infusion  500 mL Intravenous Continuous Sheryn Bison, MD  BP 131/87  Pulse 81  Resp 18  Ht 5\' 9"  (1.753 m)  Wt 309 lb 12.8 oz (140.524 kg)  BMI 45.75 kg/m2 General: NAD, obese.  Neck: JVP 7 cm, no thyromegaly or thyroid nodule.  Lungs: Clear to auscultation bilaterally with normal respiratory effort. CV: Nondisplaced PMI.  Heart regular S1/S2, no S3/S4, 1/6 HSM.  Trace ankle edema.  No carotid bruit.  Normal pedal pulses.  Abdomen: Soft, nontender, no hepatosplenomegaly, no distention.  Neurologic: Alert and oriented x 3.  Psych: Normal affect. Extremities: No clubbing or cyanosis.

## 2010-12-23 NOTE — Assessment & Plan Note (Signed)
Nonischemic cardiomyopathy with EF 30-35% by echo and no angiographic CAD by recent cath. Volume status appears good today.  NYHA class II symptoms.  His cardiomyopathy may be due to longstanding HTN. - Continue current Lasix and enalapril doses.  - Increase Coreg to 18.75 mg bid.  - Add spironolactone 12.5 mg daily with BMET in 10 days.  - We have discussed Bidil but he does not think that he would be compliant with a three times a day medication.   - Echo in 9/12 to reassess EF (? ICD).   - Check BMET, BNP, TSH, SPEP, ANA, and HIV.

## 2010-12-30 ENCOUNTER — Other Ambulatory Visit: Payer: Self-pay | Admitting: *Deleted

## 2010-12-30 ENCOUNTER — Other Ambulatory Visit (INDEPENDENT_AMBULATORY_CARE_PROVIDER_SITE_OTHER): Payer: Self-pay | Admitting: *Deleted

## 2010-12-30 DIAGNOSIS — E785 Hyperlipidemia, unspecified: Secondary | ICD-10-CM

## 2010-12-30 DIAGNOSIS — I499 Cardiac arrhythmia, unspecified: Secondary | ICD-10-CM

## 2010-12-30 DIAGNOSIS — N259 Disorder resulting from impaired renal tubular function, unspecified: Secondary | ICD-10-CM

## 2010-12-30 DIAGNOSIS — Z0000 Encounter for general adult medical examination without abnormal findings: Secondary | ICD-10-CM

## 2010-12-30 DIAGNOSIS — I10 Essential (primary) hypertension: Secondary | ICD-10-CM

## 2010-12-30 DIAGNOSIS — E663 Overweight: Secondary | ICD-10-CM

## 2010-12-30 DIAGNOSIS — N529 Male erectile dysfunction, unspecified: Secondary | ICD-10-CM

## 2010-12-30 DIAGNOSIS — I5022 Chronic systolic (congestive) heart failure: Secondary | ICD-10-CM

## 2010-12-30 LAB — BASIC METABOLIC PANEL
BUN: 18 mg/dL (ref 6–23)
Chloride: 106 mEq/L (ref 96–112)
Potassium: 4 mEq/L (ref 3.5–5.1)

## 2010-12-30 MED ORDER — ENALAPRIL MALEATE 20 MG PO TABS: 40.0000 mg | ORAL_TABLET | Freq: Every day | ORAL | Status: DC

## 2011-02-16 ENCOUNTER — Encounter: Payer: Self-pay | Admitting: Cardiology

## 2011-02-20 ENCOUNTER — Ambulatory Visit (INDEPENDENT_AMBULATORY_CARE_PROVIDER_SITE_OTHER): Payer: Self-pay | Admitting: Cardiology

## 2011-02-20 ENCOUNTER — Encounter: Payer: Self-pay | Admitting: Cardiology

## 2011-02-20 DIAGNOSIS — I5020 Unspecified systolic (congestive) heart failure: Secondary | ICD-10-CM

## 2011-02-20 DIAGNOSIS — I10 Essential (primary) hypertension: Secondary | ICD-10-CM

## 2011-02-20 DIAGNOSIS — I5022 Chronic systolic (congestive) heart failure: Secondary | ICD-10-CM

## 2011-02-20 DIAGNOSIS — E663 Overweight: Secondary | ICD-10-CM

## 2011-02-20 MED ORDER — CARVEDILOL 25 MG PO TABS: 25.0000 mg | ORAL_TABLET | Freq: Two times a day (BID) | ORAL | Status: DC

## 2011-02-20 MED ORDER — SPIRONOLACTONE 25 MG PO TABS: 25.0000 mg | ORAL_TABLET | Freq: Every day | ORAL | Status: DC

## 2011-02-20 NOTE — Assessment & Plan Note (Signed)
Nonischemic cardiomyopathy with EF 30-35% by echo and no angiographic CAD by recent cath. Volume status appears good today.  NYHA class II symptoms.  His cardiomyopathy may be due to longstanding HTN. - Continue current Lasix and enalapril doses.  - Increase Coreg to 25 mg bid.  - Increase spironolactone to 25 mg daily with BMET/BNP in 2 weeks.  - We have discussed Bidil but he does not think that he would be compliant with a three times a day medication.   - Echo in 9/12 to reassess EF (? ICD).   - Check BMET, BNP, TSH, SPEP, ANA, and HIV.

## 2011-02-20 NOTE — Assessment & Plan Note (Signed)
Out of Coreg for about 1 week, taking other meds.  Will restart Coreg at 25 mg bid and increase spironolactone to 25 mg daily.

## 2011-02-20 NOTE — Progress Notes (Signed)
PCP: [facility] (will have a new MD, not sure who)  50 yo with history of obesity, hyperlipidemia, HTN, and nonischemic cardiomyopathy with CHF returns for cardiology evaluation.  He has been out of Coreg for about a week and BP today is 136/100.  He has gained 7 lbs and attributes this to a bad diet (high salt, high fat).  Symptomatically he is stable.  He is short of breath walking up a flight of steps but has no problems walking on flat ground.  No orthopnea/PND.  His left ankle has been hurting so he has not been exercising much.   ECG: NSR, LAFB, poor anterior R wave progression  Labs (3/12): K 4.1, creatinine 1.5, LDL 91, HDL 29, LFTs normal, BNP 260, HCT 43 Labs (6/12): K 4, creatinine 1.4, HIV negative, TSH normal, ANA normal  Past Medical History: 1. Nonischemic cardiomyopathy: Echo (3/12) with EF 30-35% and mildly dilated LV, diffuse LV hypokinesis, moderate MR, PA systolic pressure 55 mmHg.  Left and right heart cath (3/12): No angiographic CAD; mean RA 20, PA 76/41, mean PCWP 38, CI 2.1.  ANA and HIV negative.  TSH normal.  Denies drug abuse, heavy ETOH intake.  No family history of cardiomyopathy.  Cardiomyopathy may be due to long-standing HTN.  2. ERECTILE DYSFUNCTION, ORGANIC (ICD-607.84) 3. HYPERTENSION (ICD-401.9) 4. CKD 5. DYSLIPIDEMIA (ICD-272.4) 6. Obesity 7. Hyperlipidemia    Family History: No history of cardiomyopathy No history of cancer among first degree relatives father - died of MI (80s)  mother - HTN  Social History: Lives with mother.  Unemployed, formerly a Film/video editor.  1 sexual partner (male), no contraceptives.  1 adult child. tobacco - none alcohol - none drugs - none  Review of Systems        All systems reviewed and negative except as per HPI.   Current Outpatient Prescriptions  Medication Sig Dispense Refill  . aspirin 81 MG tablet Take 1 tablet (81 mg total) by mouth daily.  30 tablet  11  . enalapril (VASOTEC) 20 MG tablet  Take 2 tablets (40 mg total) by mouth daily.  60 tablet  11  . furosemide (LASIX) 40 MG tablet Take 2 tablets in the morning and 1 tablet in the afternoon.      . potassium chloride SA (K-DUR,KLOR-CON) 20 MEQ tablet Take 20 mEq by mouth daily.        . simvastatin (ZOCOR) 40 MG tablet Take 1 tablet (40 mg total) by mouth at bedtime.  30 tablet  11  . DISCONTD: spironolactone (ALDACTONE) 12.5 mg TABS Take 0.5 tablets (12.5 mg total) by mouth daily.  30 tablet  6  . carvedilol (COREG) 25 MG tablet Take 1 tablet (25 mg total) by mouth 2 (two) times daily.  60 tablet  11  . furosemide (LASIX) 40 MG tablet Take 1 tablet (40 mg total) by mouth 4 (four) times daily.  16 tablet  0  . spironolactone (ALDACTONE) 25 MG tablet Take 1 tablet (25 mg total) by mouth daily.  30 tablet  11   Current Facility-Administered Medications  Medication Dose Route Frequency Provider Last Rate Last Dose  . DISCONTD: 0.9 %  sodium chloride infusion  500 mL Intravenous Continuous Sheryn Bison, MD        BP 136/100  Pulse 80  Resp 18  Ht 5\' 9"  (1.753 m)  Wt 315 lb 12.8 oz (143.246 kg)  BMI 46.64 kg/m2 General: NAD, obese.  Neck:  Thick, JVP 7 cm,  no thyromegaly or thyroid nodule.  Lungs: Clear to auscultation bilaterally with normal respiratory effort. CV: Nondisplaced PMI.  Heart regular S1/S2, no S3/S4, 1/6 HSM.  1+ ankle edema.  No carotid bruit.  Normal pedal pulses.  Abdomen: Soft, nontender, no hepatosplenomegaly, no distention.  Neurologic: Alert and oriented x 3.  Psych: Normal affect. Extremities: No clubbing or cyanosis.

## 2011-02-20 NOTE — Assessment & Plan Note (Addendum)
We had a long discussion about diet and exercise for weight loss.  He has a long way to go.  I am going to send him to see a nutritionist.

## 2011-02-20 NOTE — Patient Instructions (Addendum)
Increase Coreg (carvedilol)  to 25mg  twice a day. You can take two 12.5mg  tablets twice a day.  Increase Spironolactone to 25mg  daily.  Schedule an appointment for lab in 2 weeks--BMP/BNP [phone]  Dr Shirlee Latch has referred for a nutrition evaluation.  Schedule an appointment with Dr Shirlee Latch after you have the echo done on September 4.

## 2011-03-07 NOTE — Telephone Encounter (Signed)
error 

## 2011-03-08 ENCOUNTER — Other Ambulatory Visit: Payer: Self-pay | Admitting: *Deleted

## 2011-03-08 ENCOUNTER — Other Ambulatory Visit (INDEPENDENT_AMBULATORY_CARE_PROVIDER_SITE_OTHER): Payer: Self-pay | Admitting: *Deleted

## 2011-03-08 DIAGNOSIS — I10 Essential (primary) hypertension: Secondary | ICD-10-CM

## 2011-03-08 DIAGNOSIS — I5022 Chronic systolic (congestive) heart failure: Secondary | ICD-10-CM

## 2011-03-08 LAB — BASIC METABOLIC PANEL
CO2: 28 mEq/L (ref 19–32)
Calcium: 9 mg/dL (ref 8.4–10.5)
Potassium: 4.2 mEq/L (ref 3.5–5.1)
Sodium: 143 mEq/L (ref 135–145)

## 2011-03-08 LAB — BRAIN NATRIURETIC PEPTIDE: Pro B Natriuretic peptide (BNP): 128 pg/mL — ABNORMAL HIGH (ref 0.0–100.0)

## 2011-03-28 ENCOUNTER — Other Ambulatory Visit (HOSPITAL_COMMUNITY): Payer: Self-pay | Admitting: Radiology

## 2011-03-30 ENCOUNTER — Ambulatory Visit: Payer: Self-pay | Admitting: Cardiology

## 2011-04-11 ENCOUNTER — Encounter: Payer: Self-pay | Admitting: Cardiology

## 2011-06-27 ENCOUNTER — Other Ambulatory Visit: Payer: Self-pay | Admitting: *Deleted

## 2011-06-27 DIAGNOSIS — I5022 Chronic systolic (congestive) heart failure: Secondary | ICD-10-CM

## 2011-06-27 MED ORDER — FUROSEMIDE 40 MG PO TABS: ORAL_TABLET | ORAL | Status: DC

## 2011-06-27 NOTE — Telephone Encounter (Signed)
Called to [facility] 

## 2011-07-17 ENCOUNTER — Emergency Department (HOSPITAL_COMMUNITY)
Admission: EM | Admit: 2011-07-17 | Discharge: 2011-07-17 | Disposition: A | Discharge status: Home / Self Care | Payer: Self-pay | Attending: Emergency Medicine | Admitting: Emergency Medicine

## 2011-07-17 ENCOUNTER — Emergency Department (HOSPITAL_COMMUNITY): Payer: Self-pay

## 2011-07-17 ENCOUNTER — Encounter (HOSPITAL_COMMUNITY): Payer: Self-pay | Admitting: *Deleted

## 2011-07-17 DIAGNOSIS — J40 Bronchitis, not specified as acute or chronic: Secondary | ICD-10-CM | POA: Insufficient documentation

## 2011-07-17 DIAGNOSIS — I129 Hypertensive chronic kidney disease with stage 1 through stage 4 chronic kidney disease, or unspecified chronic kidney disease: Secondary | ICD-10-CM | POA: Insufficient documentation

## 2011-07-17 DIAGNOSIS — N189 Chronic kidney disease, unspecified: Secondary | ICD-10-CM | POA: Insufficient documentation

## 2011-07-17 DIAGNOSIS — E785 Hyperlipidemia, unspecified: Secondary | ICD-10-CM | POA: Insufficient documentation

## 2011-07-17 DIAGNOSIS — Z7982 Long term (current) use of aspirin: Secondary | ICD-10-CM | POA: Insufficient documentation

## 2011-07-17 DIAGNOSIS — I509 Heart failure, unspecified: Secondary | ICD-10-CM | POA: Insufficient documentation

## 2011-07-17 MED ORDER — AZITHROMYCIN 250 MG PO TABS: 250.0000 mg | ORAL_TABLET | Freq: Every day | ORAL | Status: AC

## 2011-07-17 MED ORDER — ALBUTEROL SULFATE HFA 108 (90 BASE) MCG/ACT IN AERS
2.0000 | INHALATION_SPRAY | Freq: Four times a day (QID) | RESPIRATORY_TRACT | Status: DC
  Administered 2011-07-17: 2 via RESPIRATORY_TRACT
  Filled 2011-07-17: qty 6.7

## 2011-07-17 MED ORDER — NAPROXEN 500 MG PO TABS: 500.0000 mg | ORAL_TABLET | Freq: Two times a day (BID) | ORAL | Status: DC

## 2011-07-17 NOTE — ED Notes (Signed)
Pt c/o cough and congestion x 2 days. Pt states that he is spitting up green phlegm with a little blood in it. Denies fever.

## 2011-07-17 NOTE — ED Provider Notes (Signed)
History   This chart was scribed for Shelda Jakes, MD by Clarita Crane. The patient was seen in room APA07/APA07 and the patient's care was started at 1:51pm.  CSN: [id number]  Arrival date & time 07/17/11  1144   First MD Initiated Contact with Patient 07/17/11 1311      Chief Complaint  Patient presents with  . Cough    (Consider location/radiation/quality/duration/timing/severity/associated sxs/prior treatment) HPI Eric Murillo is a 50 y.o. male who presents to the Emergency Department complaining of moderate constant productive cough with green sputum and congestion onset several days ago and persistent since. Pt c/o associated soreness in the chest due to coughing and myalgias. Pt denies fever, vomiting, diarrhea, rash, back pain. Pt did not receive flu shot this year.   PC[facility] outpatient [facility] at [facility].   Past Medical History  Diagnosis Date  . Hypertension   . Hyperlipidemia   . CHF (congestive heart failure)     EF 50-55%    . Erectile dysfunction   . CKD (chronic kidney disease)   . Obesity     History reviewed. No pertinent past surgical history.  Family History  Problem Relation Age of Onset  . Hypertension Mother   . Hypertension Father   . Cancer Father     brother died of brain cancer; sister died of bone cancer  . Diabetes Sister   . Diabetes Brother   . Colon cancer Maternal Aunt   . Cardiomyopathy Neg Hx     History  Substance Use Topics  . Smoking status: Never Smoker   . Smokeless tobacco: Never Used  . Alcohol Use: No      Review of Systems  Constitutional: Negative for fever and chills.  HENT: Positive for congestion and sore throat. Negative for rhinorrhea and neck pain.   Eyes: Negative for pain.  Respiratory: Positive for cough (with green phlegm). Negative for shortness of breath.   Cardiovascular: Negative for chest pain.  Gastrointestinal: Negative for nausea, vomiting, abdominal pain and diarrhea.  Genitourinary:  Negative for dysuria.  Musculoskeletal: Negative for back pain.  Skin: Negative for rash.  Neurological: Negative for dizziness and weakness.  All other systems reviewed and are negative.    Allergies  Beta adrenergic blockers  Home Medications   Current Outpatient Rx  Name Route Sig Dispense Refill  . ASPIRIN 81 MG PO TABS Oral Take 1 tablet (81 mg total) by mouth daily. 30 tablet 11  . CARVEDILOL 25 MG PO TABS Oral Take 1 tablet (25 mg total) by mouth 2 (two) times daily. 60 tablet 11  . ENALAPRIL MALEATE 20 MG PO TABS Oral Take 2 tablets (40 mg total) by mouth daily. 60 tablet 11  . FUROSEMIDE 40 MG PO TABS  Take 2 tablets in the morning and 1 tablet in the afternoon. 90 tablet 6  . POTASSIUM CHLORIDE CRYS CR 20 MEQ PO TBCR Oral Take 20 mEq by mouth daily.      Marland Kitchen SIMVASTATIN 40 MG PO TABS Oral Take 1 tablet (40 mg total) by mouth at bedtime. 30 tablet 11  . SPIRONOLACTONE 25 MG PO TABS Oral Take 1 tablet (25 mg total) by mouth daily. 30 tablet 11  . AZITHROMYCIN 250 MG PO TABS Oral Take 1 tablet (250 mg total) by mouth daily. Take first 2 tablets together, then 1 every day until finished. 6 tablet 0  . FUROSEMIDE 40 MG PO TABS Oral Take 1 tablet (40 mg total) by mouth 4 (four) times  daily. 16 tablet 0  . NAPROXEN 500 MG PO TABS Oral Take 1 tablet (500 mg total) by mouth 2 (two) times daily. 14 tablet 0    BP 118/67  Pulse 82  Temp(Src) 98.8 F (37.1 C) (Oral)  Resp 20  Ht 5' 9.5" (1.765 m)  Wt 320 lb (145.151 kg)  BMI 46.58 kg/m2  SpO2 93%  Physical Exam  Nursing note and vitals reviewed. Constitutional: He is oriented to person, place, and time. He appears well-developed and well-nourished. No distress.  HENT:  Head: Normocephalic.  Right Ear: External ear normal.  Left Ear: External ear normal.  Mouth/Throat: Oropharynx is clear and moist.       Oropharynx mildly erythematous.   Neck: Neck supple.  Cardiovascular: Normal rate, regular rhythm and normal heart  sounds.   Pulmonary/Chest: Effort normal and breath sounds normal. No respiratory distress.       Lung sounds normal and clear  Abdominal: Soft. Bowel sounds are normal. There is no tenderness.  Neurological: He is alert and oriented to person, place, and time.  Skin: Skin is warm and dry.  Psychiatric: He has a normal mood and affect. His behavior is normal.    ED Course  Procedures (including critical care time) DIAGNOSTIC STUDIES: Oxygen Saturation is 93% on RA, adequate by my interpretation.    COORDINATION OF CARE:     Labs Reviewed - No data to display Dg Chest 2 View  07/17/2011  *RADIOLOGY REPORT*  Clinical Data: Cough and chest congestion.  CHEST - 2 VIEW  Comparison: 10/13/2010  Findings: Low lung volumes again noted.  Heart size remains at the upper limits of normal, and is stable.  Both lungs are clear.  No evidence of pleural effusion.  No mass or lymphadenopathy identified.  IMPRESSION: No acute findings.  Original Report Authenticated By: Danae Orleans, M.D.     1. Bronchitis       MDM   Patient with upper respiratory infection some component consistent with bronchitis a bit of blood-tinged sputum also could be a mild influenza infection. No acute stress in the emergency department. Chest x-ray is negative. Patient also sore throats Zithromax for the bronchitis will help if this were to be strep throat.    I personally performed the services described in this documentation, which was scribed in my presence. The recorded information has been reviewed and considered.      Shelda Jakes, MD 07/17/11 1435

## 2011-08-16 ENCOUNTER — Encounter (HOSPITAL_COMMUNITY): Payer: Self-pay

## 2011-08-16 ENCOUNTER — Emergency Department (HOSPITAL_COMMUNITY): Payer: Self-pay

## 2011-08-16 ENCOUNTER — Emergency Department (HOSPITAL_COMMUNITY)
Admission: EM | Admit: 2011-08-16 | Discharge: 2011-08-16 | Disposition: A | Discharge status: Home / Self Care | Payer: Self-pay | Attending: Emergency Medicine | Admitting: Emergency Medicine

## 2011-08-16 DIAGNOSIS — I129 Hypertensive chronic kidney disease with stage 1 through stage 4 chronic kidney disease, or unspecified chronic kidney disease: Secondary | ICD-10-CM | POA: Insufficient documentation

## 2011-08-16 DIAGNOSIS — N189 Chronic kidney disease, unspecified: Secondary | ICD-10-CM | POA: Insufficient documentation

## 2011-08-16 DIAGNOSIS — Z6841 Body Mass Index (BMI) 40.0 and over, adult: Secondary | ICD-10-CM | POA: Insufficient documentation

## 2011-08-16 DIAGNOSIS — E785 Hyperlipidemia, unspecified: Secondary | ICD-10-CM | POA: Insufficient documentation

## 2011-08-16 DIAGNOSIS — I509 Heart failure, unspecified: Secondary | ICD-10-CM | POA: Insufficient documentation

## 2011-08-16 DIAGNOSIS — Z7982 Long term (current) use of aspirin: Secondary | ICD-10-CM | POA: Insufficient documentation

## 2011-08-16 DIAGNOSIS — E669 Obesity, unspecified: Secondary | ICD-10-CM | POA: Insufficient documentation

## 2011-08-16 DIAGNOSIS — M25579 Pain in unspecified ankle and joints of unspecified foot: Secondary | ICD-10-CM | POA: Insufficient documentation

## 2011-08-16 MED ORDER — IBUPROFEN 800 MG PO TABS
800.0000 mg | ORAL_TABLET | Freq: Once | ORAL | Status: AC
  Administered 2011-08-16: 800 mg via ORAL
  Filled 2011-08-16: qty 1

## 2011-08-16 MED ORDER — HYDROCODONE-ACETAMINOPHEN 5-325 MG PO TABS
2.0000 | ORAL_TABLET | Freq: Once | ORAL | Status: AC
  Administered 2011-08-16: 2 via ORAL
  Filled 2011-08-16: qty 2

## 2011-08-16 MED ORDER — HYDROCODONE-ACETAMINOPHEN 5-325 MG PO TABS: 1.0000 | ORAL_TABLET | ORAL | Status: DC | PRN

## 2011-08-16 MED ORDER — HYDROCODONE-ACETAMINOPHEN 5-325 MG PO TABS: 1.0000 | ORAL_TABLET | ORAL | Status: AC | PRN

## 2011-08-16 MED ORDER — IBUPROFEN 800 MG PO TABS: 800.0000 mg | ORAL_TABLET | Freq: Three times a day (TID) | ORAL | Status: AC

## 2011-08-16 NOTE — ED Notes (Signed)
Pt refuses crutches stating "I have my own at home."

## 2011-08-16 NOTE — ED Notes (Signed)
BP 138/82 right arm.

## 2011-08-16 NOTE — ED Notes (Signed)
Pt c/o ankle pain x 2 days, denies injury.

## 2011-08-19 NOTE — ED Provider Notes (Signed)
History     CSN: [id number]  Arrival date & time 08/16/11  9528   First MD Initiated Contact with Patient 08/16/11 1854      Chief Complaint  Patient presents with  . Ankle Pain    (Consider location/radiation/quality/duration/timing/severity/associated sxs/prior treatment) HPI Eric Murillo is a 51 y.o. male who presents to the Emergency Department complaining of right ankle pain x 2 days with no known injury. Pain with ambulation. Denies fever, chills. Past Medical History  Diagnosis Date  . Hypertension   . Hyperlipidemia   . CHF (congestive heart failure)     EF 50-55%    . Erectile dysfunction   . CKD (chronic kidney disease)   . Obesity   . Gout     Past Surgical History  Procedure Date  . Colonoscopy     Family History  Problem Relation Age of Onset  . Hypertension Mother   . Hypertension Father   . Cancer Father     brother died of brain cancer; sister died of bone cancer  . Diabetes Sister   . Diabetes Brother   . Colon cancer Maternal Aunt   . Cardiomyopathy Neg Hx     History  Substance Use Topics  . Smoking status: Never Smoker   . Smokeless tobacco: Never Used  . Alcohol Use: No      Review of Systems 10 Systems reviewed and are negative for acute change except as noted in the HPI. Allergies  Beta adrenergic blockers  Home Medications   Current Outpatient Rx  Name Route Sig Dispense Refill  . ASPIRIN 81 MG PO TABS Oral Take 1 tablet (81 mg total) by mouth daily. 30 tablet 11  . CARVEDILOL 25 MG PO TABS Oral Take 1 tablet (25 mg total) by mouth 2 (two) times daily. 60 tablet 11  . ENALAPRIL MALEATE 20 MG PO TABS Oral Take 2 tablets (40 mg total) by mouth daily. 60 tablet 11  . FUROSEMIDE 40 MG PO TABS  Take 2 tablets in the morning and 1 tablet in the afternoon. 90 tablet 6  . POTASSIUM CHLORIDE CRYS ER 20 MEQ PO TBCR Oral Take 20 mEq by mouth daily.      Marland Kitchen SIMVASTATIN 40 MG PO TABS Oral Take 1 tablet (40 mg total) by mouth at bedtime.  30 tablet 11  . SPIRONOLACTONE 25 MG PO TABS Oral Take 1 tablet (25 mg total) by mouth daily. 30 tablet 11  . FUROSEMIDE 40 MG PO TABS Oral Take 1 tablet (40 mg total) by mouth 4 (four) times daily. 16 tablet 0  . HYDROCODONE-ACETAMINOPHEN 5-325 MG PO TABS Oral Take 1 tablet by mouth every 4 (four) hours as needed for pain. 15 tablet 0  . IBUPROFEN 800 MG PO TABS Oral Take 1 tablet (800 mg total) by mouth 3 (three) times daily. 21 tablet 0    BP 105/67  Pulse 92  Temp(Src) 97.3 F (36.3 C) (Oral)  Resp 24  Ht 5' 9.5" (1.765 m)  Wt 330 lb (149.687 kg)  BMI 48.03 kg/m2  SpO2 97%  Physical Exam  Nursing note and vitals reviewed. Constitutional: He is oriented to person, place, and time. He appears well-developed and well-nourished. No distress.  HENT:  Head: Normocephalic and atraumatic.  Eyes: EOM are normal. Pupils are equal, round, and reactive to light.  Neck: Normal range of motion. Neck supple.  Cardiovascular: Normal rate, normal heart sounds and intact distal pulses.   Pulmonary/Chest: Effort normal and breath sounds normal.  Musculoskeletal: Normal range of motion. He exhibits no edema.       No swelling, effusion, bruising noted. Mild tenderness to medial ankle with palpation.  Neurological: He is alert and oriented to person, place, and time. He has normal reflexes.  Skin: Skin is dry.    ED Course  Procedures (including critical care time)  Dg Ankle Complete Right  08/16/2011  *RADIOLOGY REPORT*  Clinical Data: Right ankle pain.  RIGHT ANKLE - COMPLETE 3+ VIEW  Comparison: None.  Findings: No fracture, foreign body, or acute bony findings are identified.  Plantar and Achilles calcaneal spurs are present. There is mild dorsal spurring of the talus.  IMPRESSION:  1.  Mild hindfoot spurring.  No acute bony findings.  If symptoms persist despite conservative therapy, MRI followup may be warranted.  Original Report Authenticated By: Dellia Cloud, M.D.   1. Ankle pain        MDM  Patient with ankle pain and no known injury. Xray without acute findings. Patient given analgesics, antiinflammatory with improvement. Placed in ASO and given crutches. Referral to orthopedics as the patient's discretion. Pt stable in ED with no significant deterioration in condition.The patient appears reasonably screened and/or stabilized for discharge and I doubt any other medical condition or other [facility] requiring further screening, evaluation, or treatment in the ED at this time prior to discharge.  MDM Reviewed: nursing note and vitals Interpretation: x-ray          Nicoletta Dress. Colon Branch, MD 08/19/11 [phone]

## 2011-09-13 ENCOUNTER — Telehealth: Payer: Self-pay | Admitting: Cardiology

## 2011-09-13 NOTE — Telephone Encounter (Signed)
Pt Signed ROI, will pick up all Records Friday, they are ready 09/13/11/KM

## 2011-09-23 ENCOUNTER — Encounter (HOSPITAL_COMMUNITY): Payer: Self-pay | Admitting: *Deleted

## 2011-09-23 ENCOUNTER — Emergency Department (HOSPITAL_COMMUNITY)
Admission: EM | Admit: 2011-09-23 | Discharge: 2011-09-23 | Disposition: A | Discharge status: Home / Self Care | Payer: Self-pay | Attending: Emergency Medicine | Admitting: Emergency Medicine

## 2011-09-23 DIAGNOSIS — M7989 Other specified soft tissue disorders: Secondary | ICD-10-CM | POA: Insufficient documentation

## 2011-09-23 DIAGNOSIS — E785 Hyperlipidemia, unspecified: Secondary | ICD-10-CM | POA: Insufficient documentation

## 2011-09-23 DIAGNOSIS — I129 Hypertensive chronic kidney disease with stage 1 through stage 4 chronic kidney disease, or unspecified chronic kidney disease: Secondary | ICD-10-CM | POA: Insufficient documentation

## 2011-09-23 DIAGNOSIS — N189 Chronic kidney disease, unspecified: Secondary | ICD-10-CM | POA: Insufficient documentation

## 2011-09-23 DIAGNOSIS — Z79899 Other long term (current) drug therapy: Secondary | ICD-10-CM | POA: Insufficient documentation

## 2011-09-23 DIAGNOSIS — I509 Heart failure, unspecified: Secondary | ICD-10-CM | POA: Insufficient documentation

## 2011-09-23 DIAGNOSIS — Z7982 Long term (current) use of aspirin: Secondary | ICD-10-CM | POA: Insufficient documentation

## 2011-09-23 DIAGNOSIS — M79609 Pain in unspecified limb: Secondary | ICD-10-CM | POA: Insufficient documentation

## 2011-09-23 DIAGNOSIS — M25579 Pain in unspecified ankle and joints of unspecified foot: Secondary | ICD-10-CM | POA: Insufficient documentation

## 2011-09-23 DIAGNOSIS — M109 Gout, unspecified: Secondary | ICD-10-CM | POA: Insufficient documentation

## 2011-09-23 MED ORDER — PREDNISONE 20 MG PO TABS: 20.0000 mg | ORAL_TABLET | Freq: Two times a day (BID) | ORAL | Status: AC

## 2011-09-23 MED ORDER — OXYCODONE-ACETAMINOPHEN 5-325 MG PO TABS: 1.0000 | ORAL_TABLET | Freq: Four times a day (QID) | ORAL | Status: AC | PRN

## 2011-09-23 NOTE — ED Provider Notes (Signed)
History     CSN: [id number]  Arrival date & time 09/23/11  4098   First MD Initiated Contact with Patient 09/23/11 2004      Chief Complaint  Patient presents with  . Foot Pain    (Consider location/radiation/quality/duration/timing/severity/associated sxs/prior treatment) HPI Comments: Patient with a history of hypertension COPD obesity and gout presents emergency department with chief complaint of right foot pain.  Patient states that his right great toe, he has a history of gout.  In addition patient states that his right ankle has been hurting him as well.  Patient is able to ambulate without difficulty and denies any recent trauma or injury to the right foot or ankle.  Patient is a 51 y.o. male presenting with lower extremity pain. The history is provided by the patient.  Foot Pain Pertinent negatives include no abdominal pain, arthralgias, chest pain, chills, congestion, fever, headaches, joint swelling, myalgias, numbness or weakness.    Past Medical History  Diagnosis Date  . Hypertension   . Hyperlipidemia   . CHF (congestive heart failure)     EF 50-55%    . Erectile dysfunction   . CKD (chronic kidney disease)   . Obesity   . Gout     Past Surgical History  Procedure Date  . Colonoscopy     Family History  Problem Relation Age of Onset  . Hypertension Mother   . Hypertension Father   . Cancer Father     brother died of brain cancer; sister died of bone cancer  . Diabetes Sister   . Diabetes Brother   . Colon cancer Maternal Aunt   . Cardiomyopathy Neg Hx     History  Substance Use Topics  . Smoking status: Never Smoker   . Smokeless tobacco: Never Used  . Alcohol Use: No      Review of Systems  Constitutional: Negative for fever, chills and appetite change.  HENT: Negative for congestion.   Eyes: Negative for visual disturbance.  Respiratory: Negative for shortness of breath.   Cardiovascular: Negative for chest pain and leg swelling.    Gastrointestinal: Negative for abdominal pain.  Genitourinary: Negative for dysuria, urgency and frequency.  Musculoskeletal: Negative for myalgias, back pain, joint swelling, arthralgias and gait problem.       Right ankle and great tow pain  Neurological: Negative for dizziness, syncope, weakness, light-headedness, numbness and headaches.  Psychiatric/Behavioral: Negative for confusion.  All other systems reviewed and are negative.    Allergies  Beta adrenergic blockers  Home Medications   Current Outpatient Rx  Name Route Sig Dispense Refill  . ASPIRIN 81 MG PO TABS Oral Take 1 tablet (81 mg total) by mouth daily. 30 tablet 11  . CARVEDILOL 25 MG PO TABS Oral Take 1 tablet (25 mg total) by mouth 2 (two) times daily. 60 tablet 11  . ENALAPRIL MALEATE 20 MG PO TABS Oral Take 2 tablets (40 mg total) by mouth daily. 60 tablet 11  . FUROSEMIDE 40 MG PO TABS Oral Take 40-80 mg by mouth 2 (two) times daily. Take 2 tablets in the morning and 1 tablet in the afternoon.    Marland Kitchen SIMVASTATIN 40 MG PO TABS Oral Take 1 tablet (40 mg total) by mouth at bedtime. 30 tablet 11  . SPIRONOLACTONE 25 MG PO TABS Oral Take 1 tablet (25 mg total) by mouth daily. 30 tablet 11  . FUROSEMIDE 40 MG PO TABS Oral Take 1 tablet (40 mg total) by mouth 4 (four) times  daily. 16 tablet 0    BP 147/88  Pulse 98  Temp(Src) 98.4 F (36.9 C) (Oral)  Resp 18  SpO2 98%  Physical Exam  Nursing note and vitals reviewed. Constitutional: He is oriented to person, place, and time. He appears well-developed and well-nourished. No distress.  HENT:  Head: Normocephalic and atraumatic.  Eyes: Conjunctivae and EOM are normal.  Neck: Normal range of motion.  Pulmonary/Chest: Effort normal.  Musculoskeletal: Normal range of motion.       Swelling and erythema over and MTP joint of the great toe. TTP of extensor tensynovium of midfoot.  Full active and passive range of motion of ankle without pain.  Distal pulses palpable.   Neurological: He is alert and oriented to person, place, and time.  Skin: Skin is warm and dry. No rash noted. He is not diaphoretic.  Psychiatric: He has a normal mood and affect. His behavior is normal.    ED Course  Procedures (including critical care time)  Labs Reviewed - No data to display No results found.   No diagnosis found.  BP 147/88  Pulse 98  Temp(Src) 98.4 F (36.9 C) (Oral)  Resp 18  SpO2 98%   MDM  Gout   No recent trauma or evidence of occult fracture or injury. Pt presents w hx of gout. Afebrile and stable. Pt with hx of CKD so colchicine and NSAIDs are to be avoided.  Patient will be sent home with steroids and painkillers instead.      Jaci Carrel, [place] 09/23/11 2107  Jaci Carrel, PA-C 09/23/11 2116

## 2011-09-23 NOTE — Discharge Instructions (Signed)
Gout Gout is caused by a buildup of uric acid crystals in the joints. The crystals make your joints sore. This is like having sand in your joints. Repeat attacks are common. Gout can be treated. HOME CARE   Do not take aspirin for pain.   Only take medicine as told by your doctor.   You may use cold treatments (ice) on painful joints.   Put ice in a plastic bag.   Place a towel between your skin and the bag.   Leave the ice on for 15 to 20 minutes at a time, 3 to 4 times a day.   Rest in bed as much as possible. When in bed, keep the sheets and blankets off your sore joints.   Keep the sore joints raised (elevated).   Use crutches if your legs or ankles hurt.   Drink enough water and fluids to keep your pee (urine) clear or pale yellow. This helps your body get rid of uric acid. Do not drink alcohol.   Follow diet instructions as told by your doctor.   Keep your body at a healthy weight.  GET HELP RIGHT AWAY IF:   You have a temperature by mouth above 102 F (38.9 C), not controlled by medicine.   You have watery poop (diarrhea).   You are throwing up (vomiting).   You do not feel better in 1 day, or you are getting worse.   Your joint hurts more.   You have the chills.  MAKE SURE YOU:   Understand these instructions.   Will watch your condition.   Will get help right away if you are not doing well or get worse.  Document Released: 04/18/2008 Document Revised: 03/22/2011 Document Reviewed: 10/18/2009 Exit[employer] Patient Information 2012 [employer]. 

## 2011-09-23 NOTE — ED Notes (Signed)
Pt states that he is only having pain in his right ankle. Pain originally started in right big toe then the pain changed to his ankle. Pt denies twisting or falling on his ankle. Pt states that his ankle began to swell this afternoon. Pt ambulatory.

## 2011-09-23 NOTE — ED Notes (Signed)
Reports right foot and ankle pain and swelling for several weeks, thinks its gout, denies injury to foot. Ambulatory at triage.

## 2011-09-24 NOTE — ED Provider Notes (Signed)
Medical screening examination/treatment/procedure(s) were performed by non-physician practitioner and as supervising physician I was immediately available for consultation/collaboration.  Mitchel Delduca, MD 09/24/11 0043 

## 2011-10-30 ENCOUNTER — Encounter: Payer: Self-pay | Admitting: Internal Medicine

## 2011-10-30 ENCOUNTER — Ambulatory Visit (INDEPENDENT_AMBULATORY_CARE_PROVIDER_SITE_OTHER): Payer: Self-pay | Admitting: Internal Medicine

## 2011-10-30 VITALS — BP 121/81 | HR 80 | Temp 98.6°F | Ht 69.5 in | Wt 323.8 lb

## 2011-10-30 DIAGNOSIS — E785 Hyperlipidemia, unspecified: Secondary | ICD-10-CM

## 2011-10-30 DIAGNOSIS — I5020 Unspecified systolic (congestive) heart failure: Secondary | ICD-10-CM

## 2011-10-30 DIAGNOSIS — I10 Essential (primary) hypertension: Secondary | ICD-10-CM

## 2011-10-30 LAB — COMPLETE METABOLIC PANEL WITH GFR
ALT: 18 U/L (ref 0–53)
AST: 22 U/L (ref 0–37)
CO2: 29 mEq/L (ref 19–32)
Calcium: 9.5 mg/dL (ref 8.4–10.5)
Chloride: 104 mEq/L (ref 96–112)
Creat: 1.57 mg/dL — ABNORMAL HIGH (ref 0.50–1.35)
GFR, Est African American: 58 mL/min — ABNORMAL LOW
GFR, Est Non African American: 50 mL/min — ABNORMAL LOW
Glucose, Bld: 99 mg/dL (ref 70–99)
Potassium: 4.2 mEq/L (ref 3.5–5.3)
Sodium: 142 mEq/L (ref 135–145)
Total Protein: 7.3 g/dL (ref 6.0–8.3)

## 2011-10-30 LAB — CBC
Hemoglobin: 13.2 g/dL (ref 13.0–17.0)
MCH: 27.6 pg (ref 26.0–34.0)
MCV: 92.1 fL (ref 78.0–100.0)
Platelets: 262 10*3/uL (ref 150–400)
RBC: 4.78 MIL/uL (ref 4.22–5.81)
WBC: 14.9 10*3/uL — ABNORMAL HIGH (ref 4.0–10.5)

## 2011-10-30 LAB — LIPID PANEL
LDL Cholesterol: 75 mg/dL (ref 0–99)
Triglycerides: 156 mg/dL — ABNORMAL HIGH (ref <150)

## 2011-10-30 NOTE — Patient Instructions (Signed)
1. Follow up with me in 3 months 2. Continue your current medications 3. Please schedule f/u appt with cardiologist

## 2011-10-30 NOTE — Telephone Encounter (Signed)
Pt Picked Up Records Today 10/30/11/KM

## 2011-10-30 NOTE — Assessment & Plan Note (Signed)
Stable, he reports that he has been compliant with his medications. - continue the current regimen - follow up with cardiology

## 2011-10-30 NOTE — Assessment & Plan Note (Signed)
Volume status appears good today. NYHA class II symptoms - continue the current medications - f/u with Dr. Shirlee Latch

## 2011-10-30 NOTE — Assessment & Plan Note (Signed)
On Zocor, will check his lipid panel today.

## 2011-10-30 NOTE — Progress Notes (Signed)
Patient ID: Eric Murillo, male   DOB: Jul 26, 1960, 51 y.o.   MRN: [id number]  Subjective:   Patient ID: Eric Murillo male   DOB: 14-Jun-1961 51 y.o.   MRN: [id number]  HPI: [name] is a 51 y.o. with history of obesity, hyperlipidemia, HTN, and nonischemic cardiomyopathy with CHF who presents to the clinic for follow up visit. He states that he has been compliant with all his medications, including ASA, Coreg, lasix, spirolactone and Zocor.  Symptomatically he is stable. He feels short of breath walking up a flight of steps but has no problems walking on flat ground. No orthopnea/PND. He reports that this is his baseline. He also reports 6 lbs weight gain since last October and attributes this to a bad diet (high salt, high fat)  No headache, fever, or sore throat. No shortness of breath or dyspnea on exertion. No chest pain, chest pressure or palpitation No nausea, vomiting, or abdominal pain. No melena, diarrhea or incontinence. No muscle weakness. Denies depression. No appetite or weight changes.    l  Past Medical History:  1. Nonischemic cardiomyopathy: Echo (3/12) with EF 30-35% and mildly dilated LV, diffuse LV hypokinesis, moderate MR, PA systolic pressure 55 mmHg. Left and right heart cath (3/12): No angiographic CAD; mean RA 20, PA 76/41, mean PCWP 38, CI 2.1. ANA and HIV negative. TSH normal. Denies drug abuse, heavy ETOH intake. No family history of cardiomyopathy. Cardiomyopathy may be due to long-standing HTN.  2. ERECTILE DYSFUNCTION, ORGANIC (ICD-607.84)  3. HYPERTENSION (ICD-401.9)  4. CKD  5. DYSLIPIDEMIA (ICD-272.4)  6. Obesity  7. Hyperlipidemia   Current Outpatient Prescriptions  Medication Sig Dispense Refill  . aspirin 81 MG tablet Take 1 tablet (81 mg total) by mouth daily.  30 tablet  11  . carvedilol (COREG) 25 MG tablet Take 1 tablet (25 mg total) by mouth 2 (two) times daily.  60 tablet  11  . enalapril (VASOTEC) 20 MG tablet Take 2 tablets (40 mg  total) by mouth daily.  60 tablet  11  . furosemide (LASIX) 40 MG tablet Take 40-80 mg by mouth 2 (two) times daily. Take 2 tablets in the morning and 1 tablet in the afternoon.      . simvastatin (ZOCOR) 40 MG tablet Take 1 tablet (40 mg total) by mouth at bedtime.  30 tablet  11  . spironolactone (ALDACTONE) 25 MG tablet Take 1 tablet (25 mg total) by mouth daily.  30 tablet  11  . furosemide (LASIX) 40 MG tablet Take 1 tablet (40 mg total) by mouth 4 (four) times daily.  16 tablet  0   Family History  Problem Relation Age of Onset  . Hypertension Mother   . Hypertension Father   . Cancer Father     brother died of brain cancer; sister died of bone cancer  . Diabetes Sister   . Diabetes Brother   . Colon cancer Maternal Aunt   . Cardiomyopathy Neg Hx    History   Social History  . Marital Status: Single    Spouse Name: N/A    Number of Children: N/A  . Years of Education: N/A   Social History Main Topics  . Smoking status: Never Smoker   . Smokeless tobacco: Never Used  . Alcohol Use: No  . Drug Use: No  . Sexually Active: None   Other Topics Concern  . None   Social History Narrative   Financial assistance approved for 100% discount at [facility] and has  [facility]  March 16, 2010 9:42 AMLives with mother.  Has a girlfriend   Review of Systems: See HPI  Objective:  Physical Exam: Filed Vitals:   10/30/11 1121  BP: 121/81  Pulse: 80  Temp: 98.6 F (37 C)  TempSrc: Oral  Height: 5' 9.5" (1.765 m)  Weight: 323 lb 12.8 oz (146.875 kg)   General: alert, well-developed, and cooperative to examination.  Head: normocephalic and atraumatic.  Eyes: vision grossly intact, pupils equal, pupils round, pupils reactive to light, no injection and anicteric.  Mouth: pharynx pink and moist, no erythema, and no exudates.  Neck: supple, full ROM, no thyromegaly, no JVD, and no carotid bruits.  Lungs: normal respiratory effort, no accessory muscle use, normal breath  sounds, no crackles, and no wheezes. Heart: normal rate, regular rhythm, no murmur, no gallop, and no rub.  Abdomen: soft, non-tender, normal bowel sounds, no distention, no guarding, no rebound tenderness, no hepatomegaly, and no splenomegaly.  Msk: no joint swelling, no joint warmth, and no redness over joints.  Pulses: 2+ DP/PT pulses bilaterally Extremities: No cyanosis, clubbing, edema Neurologic: alert & oriented X3, cranial nerves II-XII intact, strength normal in all extremities, sensation intact to light touch, and gait normal.  Skin: turgor normal and no rashes.  Psych: Oriented X3, memory intact for recent and remote, normally interactive, good eye contact, not anxious appearing, and not depressed appearing.   Assessment & Plan:

## 2011-11-10 ENCOUNTER — Other Ambulatory Visit: Payer: Self-pay | Admitting: *Deleted

## 2011-11-10 DIAGNOSIS — N529 Male erectile dysfunction, unspecified: Secondary | ICD-10-CM

## 2011-11-10 DIAGNOSIS — I10 Essential (primary) hypertension: Secondary | ICD-10-CM

## 2011-11-10 DIAGNOSIS — I499 Cardiac arrhythmia, unspecified: Secondary | ICD-10-CM

## 2011-11-10 DIAGNOSIS — E663 Overweight: Secondary | ICD-10-CM

## 2011-11-10 DIAGNOSIS — Z0000 Encounter for general adult medical examination without abnormal findings: Secondary | ICD-10-CM

## 2011-11-10 DIAGNOSIS — N259 Disorder resulting from impaired renal tubular function, unspecified: Secondary | ICD-10-CM

## 2011-11-10 DIAGNOSIS — E785 Hyperlipidemia, unspecified: Secondary | ICD-10-CM

## 2011-11-11 ENCOUNTER — Emergency Department (HOSPITAL_COMMUNITY)
Admission: EM | Admit: 2011-11-11 | Discharge: 2011-11-11 | Disposition: A | Discharge status: Home / Self Care | Payer: Self-pay | Attending: Emergency Medicine | Admitting: Emergency Medicine

## 2011-11-11 ENCOUNTER — Encounter (HOSPITAL_COMMUNITY): Payer: Self-pay | Admitting: Emergency Medicine

## 2011-11-11 DIAGNOSIS — Z7982 Long term (current) use of aspirin: Secondary | ICD-10-CM | POA: Insufficient documentation

## 2011-11-11 DIAGNOSIS — M25579 Pain in unspecified ankle and joints of unspecified foot: Secondary | ICD-10-CM | POA: Insufficient documentation

## 2011-11-11 DIAGNOSIS — Z79899 Other long term (current) drug therapy: Secondary | ICD-10-CM | POA: Insufficient documentation

## 2011-11-11 DIAGNOSIS — I129 Hypertensive chronic kidney disease with stage 1 through stage 4 chronic kidney disease, or unspecified chronic kidney disease: Secondary | ICD-10-CM | POA: Insufficient documentation

## 2011-11-11 DIAGNOSIS — M25473 Effusion, unspecified ankle: Secondary | ICD-10-CM | POA: Insufficient documentation

## 2011-11-11 DIAGNOSIS — M109 Gout, unspecified: Secondary | ICD-10-CM | POA: Insufficient documentation

## 2011-11-11 DIAGNOSIS — E785 Hyperlipidemia, unspecified: Secondary | ICD-10-CM | POA: Insufficient documentation

## 2011-11-11 DIAGNOSIS — I509 Heart failure, unspecified: Secondary | ICD-10-CM | POA: Insufficient documentation

## 2011-11-11 DIAGNOSIS — M25476 Effusion, unspecified foot: Secondary | ICD-10-CM | POA: Insufficient documentation

## 2011-11-11 DIAGNOSIS — N189 Chronic kidney disease, unspecified: Secondary | ICD-10-CM | POA: Insufficient documentation

## 2011-11-11 MED ORDER — PREDNISONE 20 MG PO TABS: 40.0000 mg | ORAL_TABLET | Freq: Every day | ORAL | Status: AC

## 2011-11-11 MED ORDER — HYDROCODONE-ACETAMINOPHEN 5-325 MG PO TABS: 2.0000 | ORAL_TABLET | ORAL | Status: AC | PRN

## 2011-11-11 MED ORDER — SIMVASTATIN 40 MG PO TABS: 40.0000 mg | ORAL_TABLET | Freq: Every day | ORAL | Status: DC

## 2011-11-11 NOTE — Discharge Instructions (Signed)
Gout Gout is an inflammatory condition (arthritis) caused by a buildup of uric acid crystals in the joints. Uric acid is a chemical that is normally present in the blood. Under some circumstances, uric acid can form into crystals in your joints. This causes joint redness, soreness, and swelling (inflammation). Repeat attacks are common. Over time, uric acid crystals can form into masses (tophi) near a joint, causing disfigurement. Gout is treatable and often preventable. CAUSES  The disease begins with elevated levels of uric acid in the blood. Uric acid is produced by your body when it breaks down a naturally found substance called purines. This also happens when you eat certain foods such as meats and fish. Causes of an elevated uric acid level include:  Being passed down from parent to child (heredity).   Diseases that cause increased uric acid production (obesity, psoriasis, some cancers).   Excessive alcohol use.   Diet, especially diets rich in meat and seafood.   Medicines, including certain cancer-fighting drugs (chemotherapy), diuretics, and aspirin.   Chronic kidney disease. The kidneys are no longer able to remove uric acid well.   Problems with metabolism.  Conditions strongly associated with gout include:  Obesity.   High blood pressure.   High cholesterol.   Diabetes.  Not everyone with elevated uric acid levels gets gout. It is not understood why some people get gout and others do not. Surgery, joint injury, and eating too much of certain foods are some of the factors that can lead to gout. SYMPTOMS   An attack of gout comes on quickly. It causes intense pain with redness, swelling, and warmth in a joint.   Fever can occur.   Often, only one joint is involved. Certain joints are more commonly involved:   Base of the big toe.   Knee.   Ankle.   Wrist.   Finger.  Without treatment, an attack usually goes away in a few days to weeks. Between attacks, you  usually will not have symptoms, which is different from many other forms of arthritis. DIAGNOSIS  Your caregiver will suspect gout based on your symptoms and exam. Removal of fluid from the joint (arthrocentesis) is done to check for uric acid crystals. Your caregiver will give you a medicine that numbs the area (local anesthetic) and use a needle to remove joint fluid for exam. Gout is confirmed when uric acid crystals are seen in joint fluid, using a special microscope. Sometimes, blood, urine, and X-ray tests are also used. TREATMENT  There are 2 phases to gout treatment: treating the sudden onset (acute) attack and preventing attacks (prophylaxis). Treatment of an Acute Attack  Medicines are used. These include anti-inflammatory medicines or steroid medicines.   An injection of steroid medicine into the affected joint is sometimes necessary.   The painful joint is rested. Movement can worsen the arthritis.   You may use warm or cold treatments on painful joints, depending which works best for you.   Discuss the use of coffee, vitamin C, or cherries with your caregiver. These may be helpful treatment options.  Treatment to Prevent Attacks After the acute attack subsides, your caregiver may advise prophylactic medicine. These medicines either help your kidneys eliminate uric acid from your body or decrease your uric acid production. You may need to stay on these medicines for a very long time. The early phase of treatment with prophylactic medicine can be associated with an increase in acute gout attacks. For this reason, during the first few months   of treatment, your caregiver may also advise you to take medicines usually used for acute gout treatment. Be sure you understand your caregiver's directions. You should also discuss dietary treatment with your caregiver. Certain foods such as meats and fish can increase uric acid levels. Other foods such as dairy can decrease levels. Your caregiver  can give you a list of foods to avoid. HOME CARE INSTRUCTIONS   Do not take aspirin to relieve pain. This raises uric acid levels.   Only take over-the-counter or prescription medicines for pain, discomfort, or fever as directed by your caregiver.   Rest the joint as much as possible. When in bed, keep sheets and blankets off painful areas.   Keep the affected joint raised (elevated).   Use crutches if the painful joint is in your leg.   Drink enough water and fluids to keep your urine clear or pale yellow. This helps your body get rid of uric acid. Do not drink alcoholic beverages. They slow the passage of uric acid.   Follow your caregiver's dietary instructions. Pay careful attention to the amount of protein you eat. Your daily diet should emphasize fruits, vegetables, whole grains, and fat-free or low-fat milk products.   Maintain a healthy body weight.  SEEK MEDICAL CARE IF:   You have an oral temperature above 102 F (38.9 C).   You develop diarrhea, vomiting, or any side effects from medicines.   You do not feel better in 24 hours, or you are getting worse.  SEEK IMMEDIATE MEDICAL CARE IF:   Your joint becomes suddenly more tender and you have:   Chills.   An oral temperature above 102 F (38.9 C), not controlled by medicine.  MAKE SURE YOU:   Understand these instructions.   Will watch your condition.   Will get help right away if you are not doing well or get worse.  Document Released: 07/07/2000 Document Revised: 06/29/2011 Document Reviewed: 10/18/2009 [employer] Patient Information 2012 [employer]. 

## 2011-11-11 NOTE — ED Provider Notes (Signed)
History     CSN: [id number]  Arrival date & time 11/11/11  0808   First MD Initiated Contact with Patient 11/11/11 [phone]      Chief Complaint  Patient presents with  . Gout    (Consider location/radiation/quality/duration/timing/severity/associated sxs/prior treatment) HPI Comments: Patient with PMH of gout comes in today with pain in his right ankle.  No trauma or injury.  He reports that he has had Gout in this ankle before.  He reports that he ate some red meat two days ago, which typically causes his gout to flare up.  He denies any alcohol use.  He is currently not on any prophylactic gout medication.  He was seen in the ED for the same complaint on 09/23/11 and was given a six day course of Prednisone 40 mg which he reports resolved the Gout.  He has a history of chronic kidney disease and can not take NSAIDS or Colchicine.  He denies any fever or chills.  He reports that he is able to move his ankle, but it is painful.   The history is provided by the patient.    Past Medical History  Diagnosis Date  . Hypertension   . Hyperlipidemia   . CHF (congestive heart failure)     EF 50-55%    . Erectile dysfunction   . CKD (chronic kidney disease)   . Obesity   . Gout     Past Surgical History  Procedure Date  . Colonoscopy     Family History  Problem Relation Age of Onset  . Hypertension Mother   . Hypertension Father   . Cancer Father     brother died of brain cancer; sister died of bone cancer  . Diabetes Sister   . Diabetes Brother   . Colon cancer Maternal Aunt   . Cardiomyopathy Neg Hx     History  Substance Use Topics  . Smoking status: Never Smoker   . Smokeless tobacco: Never Used  . Alcohol Use: No      Review of Systems  Constitutional: Negative for fever and chills.  Gastrointestinal: Negative for vomiting.  Musculoskeletal:       Pain with ambulation  Skin: Negative for color change and wound.  Neurological: Negative for weakness and numbness.      Allergies  Beta adrenergic blockers  Home Medications   Current Outpatient Rx  Name Route Sig Dispense Refill  . ASPIRIN 81 MG PO TABS Oral Take 81 mg by mouth daily.    Marland Kitchen CARVEDILOL 25 MG PO TABS Oral Take 25 mg by mouth 2 (two) times daily with a meal.    . ENALAPRIL MALEATE 20 MG PO TABS Oral Take 40 mg by mouth daily.    . FUROSEMIDE 40 MG PO TABS Oral Take 40 mg by mouth 4 (four) times daily.     Marland Kitchen SIMVASTATIN 40 MG PO TABS Oral Take 40 mg by mouth every evening.    Marland Kitchen SPIRONOLACTONE 25 MG PO TABS Oral Take 25 mg by mouth daily.      BP 119/82  Pulse 96  Temp(Src) 97.9 F (36.6 C) (Oral)  Resp 20  SpO2 99%  Physical Exam  Nursing note and vitals reviewed. Constitutional: He appears well-developed and well-nourished. No distress.  HENT:  Head: Normocephalic and atraumatic.  Cardiovascular: Normal rate, regular rhythm and normal heart sounds.   Pulses:      Dorsalis pedis pulses are 2+ on the right side.  Pulmonary/Chest: Effort normal and breath  sounds normal.  Musculoskeletal: Normal range of motion.       Right ankle: He exhibits swelling. He exhibits normal range of motion, no deformity and normal pulse. tenderness. Lateral malleolus tenderness found.       Mild swelling of right ankle around the lateral malleolus.   Full ROM of right ankle, but pain with movement.  Neurological: He is alert. No sensory deficit.  Skin: Skin is warm and dry. He is not diaphoretic. No erythema.       No erythema or warmth to the touch of the right ankle.  Psychiatric: He has a normal mood and affect.    ED Course  Procedures (including critical care time)  Labs Reviewed - No data to display No results found.   No diagnosis found.    MDM  Patient with history of Gout comes in today with right ankle pain two days after eating red meat, which he reports typically causes his gout to flare up.  Patient afebrile and able to move ankle without difficulty.  Therefore, do not  think that pain is caused by a Septic Joint.  Patient given Prednisone prescription and prescription for pain medication.  Patient has a history of chronic kidney disease and was therefore not given NSAIDS or colchicine.          Pascal Lux [place], PA-C 11/11/11 [phone]

## 2011-11-11 NOTE — ED Notes (Signed)
Pt c/o gout pain in B/L feet and ankles x 2 days. Pt reports seen here for same last month.

## 2011-11-12 NOTE — ED Provider Notes (Signed)
Medical screening examination/treatment/procedure(s) were performed by non-physician practitioner and as supervising physician I was immediately available for consultation/collaboration.   Dione Booze, MD 11/12/11 2051

## 2011-11-13 NOTE — Telephone Encounter (Signed)
Simvastatin refill - rx request form faxed to [facility].

## 2011-12-11 ENCOUNTER — Ambulatory Visit (INDEPENDENT_AMBULATORY_CARE_PROVIDER_SITE_OTHER): Payer: Self-pay | Admitting: Cardiology

## 2011-12-11 ENCOUNTER — Encounter: Payer: Self-pay | Admitting: Cardiology

## 2011-12-11 DIAGNOSIS — G4733 Obstructive sleep apnea (adult) (pediatric): Secondary | ICD-10-CM | POA: Insufficient documentation

## 2011-12-11 DIAGNOSIS — I10 Essential (primary) hypertension: Secondary | ICD-10-CM

## 2011-12-11 DIAGNOSIS — I5020 Unspecified systolic (congestive) heart failure: Secondary | ICD-10-CM

## 2011-12-11 DIAGNOSIS — N259 Disorder resulting from impaired renal tubular function, unspecified: Secondary | ICD-10-CM

## 2011-12-11 DIAGNOSIS — R0609 Other forms of dyspnea: Secondary | ICD-10-CM

## 2011-12-11 DIAGNOSIS — R0683 Snoring: Secondary | ICD-10-CM

## 2011-12-11 DIAGNOSIS — I428 Other cardiomyopathies: Secondary | ICD-10-CM

## 2011-12-11 MED ORDER — FUROSEMIDE 40 MG PO TABS: ORAL_TABLET | ORAL | Status: DC

## 2011-12-11 NOTE — Assessment & Plan Note (Signed)
BP control reasonable.  As above, next step would be Bidil but will get echo first.

## 2011-12-11 NOTE — Progress Notes (Signed)
PCP: Dr. Dierdre Searles  51 yo with history of obesity, hyperlipidemia, HTN, and nonischemic cardiomyopathy with CHF returns for cardiology evaluation.  I have not seen him in almost a year.  He says he has been doing fairly well.  He has gained 7 lbs since last appointment.  He is short of breath after walking about 100 yards or going up a flight of stairs.  This is stable for him.  No orthopnea or PND.  No chest pain.  He has problems with food occasionally getting stuck in his esophagus (always goes down eventually).  He says he has been told that he snores loudly.  He is fatigued during the day.   ECG: NSR, LAFB, poor anterior R wave progression, 1st degree AV block 216 msec.   Labs (3/12): K 4.1, creatinine 1.5, LDL 91, HDL 29, LFTs normal, BNP 260, HCT 43 Labs (6/12): K 4, creatinine 1.4, HIV negative, TSH normal, ANA normal, SPEP negative Labs (4/13): LDL 75, HDL 30, K 4.2, creatinine 0.98  Past Medical History: 1. Nonischemic cardiomyopathy: Echo (3/12) with EF 30-35% and mildly dilated LV, diffuse LV hypokinesis, moderate MR, PA systolic pressure 55 mmHg.  Left and right heart cath (3/12): No angiographic CAD; mean RA 20, PA 76/41, mean PCWP 38, CI 2.1.  ANA, SPEP, and HIV negative.  TSH normal.  Denies drug abuse, heavy ETOH intake.  No family history of cardiomyopathy.  Cardiomyopathy may be due to long-standing HTN.  2. ERECTILE DYSFUNCTION, ORGANIC (ICD-607.84) 3. HYPERTENSION (ICD-401.9) 4. CKD 5. DYSLIPIDEMIA (ICD-272.4) 6. Obesity 7. Hyperlipidemia 8. Gout    Family History: No history of cardiomyopathy No history of cancer among first degree relatives father - died of MI (67s)  mother - HTN  Social History: Lives with mother.  Unemployed, formerly a Film/video editor.  1 sexual partner (male), no contraceptives.  1 adult child. tobacco - none alcohol - none drugs - none  Review of Systems        All systems reviewed and negative except as per HPI.   Current Outpatient  Prescriptions  Medication Sig Dispense Refill  . aspirin 81 MG tablet Take 81 mg by mouth daily.      . carvedilol (COREG) 25 MG tablet Take 25 mg by mouth 2 (two) times daily with a meal.      . enalapril (VASOTEC) 20 MG tablet Take 40 mg by mouth daily.      . furosemide (LASIX) 40 MG tablet Lasix 80mg  every morning and 40mg  every evening  30 tablet  1  . simvastatin (ZOCOR) 40 MG tablet Take 1 tablet (40 mg total) by mouth at bedtime.  30 tablet  1  . spironolactone (ALDACTONE) 25 MG tablet Take 25 mg by mouth daily.      Marland Kitchen DISCONTD: furosemide (LASIX) 40 MG tablet Take 40 mg by mouth 3 (three) times daily.       Marland Kitchen DISCONTD: simvastatin (ZOCOR) 40 MG tablet Take 40 mg by mouth every evening.        BP 137/92  Pulse 82  Ht 5\' 9"  (1.753 m)  Wt 322 lb 6.4 oz (146.24 kg)  BMI 47.61 kg/m2 General: NAD, obese.  Neck:  Thick, JVP 8 cm, no thyromegaly or thyroid nodule.  Lungs: Clear to auscultation bilaterally with normal respiratory effort. CV: Nondisplaced PMI.  Heart regular S1/S2, +S4, no murmur.  No edema.  No carotid bruit.   Abdomen: Soft, nontender, no hepatosplenomegaly, no distention.  Neurologic: Alert and oriented x 3.  Psych: Normal affect. Extremities: No clubbing or cyanosis.

## 2011-12-11 NOTE — Patient Instructions (Signed)
Your physician has requested that you have an echocardiogram. Echocardiography is a painless test that uses sound waves to create images of your heart. It provides your doctor with information about the size and shape of your heart and how well your heart's chambers and valves are working. This procedure takes approximately one hour. There are no restrictions for this procedure.  Your physician has recommended that you have a sleep study. This test records several body functions during sleep, including: brain activity, eye movement, oxygen and carbon dioxide blood levels, heart rate and rhythm, breathing rate and rhythm, the flow of air through your mouth and nose, snoring, body muscle movements, and chest and belly movement.  Your physician recommends that you return for lab work in July 2013.  BMET and BNP  Your physician recommends that you schedule a follow-up appointment in: 3 months with Dr. Shirlee Latch.

## 2011-12-11 NOTE — Assessment & Plan Note (Signed)
Patient snores, has a thick neck, and has daytime sleepiness.  OSA would be a significant comorbid factor with CHF and HTN.  I will get a sleep study, and he should be treated if he has significant OSA.

## 2011-12-11 NOTE — Assessment & Plan Note (Signed)
Nonischemic cardiomyopathy, possibly due to long-standing poorly-controlled HTN.  He has not been to this office in about 10 months but says he has been compliant with his medication regimen.  Stable NYHA class III symptoms (obesity probably plays a role in his dyspnea as well).   - Continue current doses of enalapril, Coreg, spironolactone.   - He will need more frequent BMETs if he is going to continue spironolactone.  Check BMET/BNP in 7/13.   - He does not appear significantly volume overloaded though exam is difficult given obesity.  Continue current lasix dose (80 qam, 40 qpm).  - I will get an echo to reassess EF on a good medical regimen.  If EF still < 35%, he would qualify for ICD.  - Next step medically would be Bidil.  I am not sure how good his compliance will be with this so will repeat echo and recheck EF before starting this.  - Followup in the office in 3 months.

## 2011-12-11 NOTE — Assessment & Plan Note (Signed)
Creatinine 1.57, stable.  Likely hypertensive nephropathy.

## 2012-01-09 ENCOUNTER — Ambulatory Visit (HOSPITAL_COMMUNITY): Payer: Self-pay | Attending: Cardiovascular Disease | Admitting: Radiology

## 2012-01-09 ENCOUNTER — Other Ambulatory Visit (HOSPITAL_COMMUNITY): Payer: Self-pay

## 2012-01-09 ENCOUNTER — Encounter (HOSPITAL_COMMUNITY): Payer: Self-pay | Admitting: Cardiology

## 2012-01-09 DIAGNOSIS — I509 Heart failure, unspecified: Secondary | ICD-10-CM | POA: Insufficient documentation

## 2012-01-09 DIAGNOSIS — E785 Hyperlipidemia, unspecified: Secondary | ICD-10-CM | POA: Insufficient documentation

## 2012-01-09 DIAGNOSIS — I5020 Unspecified systolic (congestive) heart failure: Secondary | ICD-10-CM | POA: Insufficient documentation

## 2012-01-09 DIAGNOSIS — I517 Cardiomegaly: Secondary | ICD-10-CM | POA: Insufficient documentation

## 2012-01-09 DIAGNOSIS — I10 Essential (primary) hypertension: Secondary | ICD-10-CM | POA: Insufficient documentation

## 2012-01-09 DIAGNOSIS — I059 Rheumatic mitral valve disease, unspecified: Secondary | ICD-10-CM | POA: Insufficient documentation

## 2012-01-09 DIAGNOSIS — I428 Other cardiomyopathies: Secondary | ICD-10-CM | POA: Insufficient documentation

## 2012-01-09 NOTE — Progress Notes (Signed)
Echocardiogram performed.  

## 2012-01-10 ENCOUNTER — Other Ambulatory Visit: Payer: Self-pay | Admitting: *Deleted

## 2012-01-10 DIAGNOSIS — I429 Cardiomyopathy, unspecified: Secondary | ICD-10-CM

## 2012-01-11 ENCOUNTER — Other Ambulatory Visit: Payer: Self-pay | Admitting: Cardiology

## 2012-01-11 NOTE — Telephone Encounter (Signed)
Pt out of pills.

## 2012-01-11 NOTE — Telephone Encounter (Signed)
Refilled enalapril and patient advised

## 2012-01-16 ENCOUNTER — Encounter: Payer: Self-pay | Admitting: Cardiology

## 2012-01-19 ENCOUNTER — Ambulatory Visit (HOSPITAL_BASED_OUTPATIENT_CLINIC_OR_DEPARTMENT_OTHER): Payer: Self-pay | Attending: Cardiology | Admitting: Radiology

## 2012-01-19 VITALS — Ht 69.5 in | Wt 322.0 lb

## 2012-01-19 DIAGNOSIS — G4733 Obstructive sleep apnea (adult) (pediatric): Secondary | ICD-10-CM | POA: Insufficient documentation

## 2012-01-19 DIAGNOSIS — R0683 Snoring: Secondary | ICD-10-CM

## 2012-01-23 ENCOUNTER — Inpatient Hospital Stay (HOSPITAL_COMMUNITY): Admission: RE | Admit: 2012-01-23 | Payer: Self-pay | Source: Ambulatory Visit

## 2012-01-24 ENCOUNTER — Emergency Department (HOSPITAL_COMMUNITY): Payer: Self-pay

## 2012-01-24 ENCOUNTER — Other Ambulatory Visit (INDEPENDENT_AMBULATORY_CARE_PROVIDER_SITE_OTHER): Payer: Self-pay

## 2012-01-24 ENCOUNTER — Encounter (HOSPITAL_COMMUNITY): Payer: Self-pay

## 2012-01-24 ENCOUNTER — Emergency Department (HOSPITAL_COMMUNITY)
Admission: EM | Admit: 2012-01-24 | Discharge: 2012-01-24 | Disposition: A | Discharge status: Home / Self Care | Payer: Self-pay | Attending: Emergency Medicine | Admitting: Emergency Medicine

## 2012-01-24 DIAGNOSIS — Z79899 Other long term (current) drug therapy: Secondary | ICD-10-CM | POA: Insufficient documentation

## 2012-01-24 DIAGNOSIS — M109 Gout, unspecified: Secondary | ICD-10-CM | POA: Insufficient documentation

## 2012-01-24 DIAGNOSIS — M25469 Effusion, unspecified knee: Secondary | ICD-10-CM | POA: Insufficient documentation

## 2012-01-24 DIAGNOSIS — Z7982 Long term (current) use of aspirin: Secondary | ICD-10-CM | POA: Insufficient documentation

## 2012-01-24 DIAGNOSIS — M25569 Pain in unspecified knee: Secondary | ICD-10-CM | POA: Insufficient documentation

## 2012-01-24 DIAGNOSIS — I10 Essential (primary) hypertension: Secondary | ICD-10-CM

## 2012-01-24 DIAGNOSIS — E785 Hyperlipidemia, unspecified: Secondary | ICD-10-CM | POA: Insufficient documentation

## 2012-01-24 DIAGNOSIS — R000 Tachycardia, unspecified: Secondary | ICD-10-CM | POA: Insufficient documentation

## 2012-01-24 DIAGNOSIS — M25562 Pain in left knee: Secondary | ICD-10-CM

## 2012-01-24 DIAGNOSIS — I509 Heart failure, unspecified: Secondary | ICD-10-CM | POA: Insufficient documentation

## 2012-01-24 DIAGNOSIS — I129 Hypertensive chronic kidney disease with stage 1 through stage 4 chronic kidney disease, or unspecified chronic kidney disease: Secondary | ICD-10-CM | POA: Insufficient documentation

## 2012-01-24 DIAGNOSIS — N189 Chronic kidney disease, unspecified: Secondary | ICD-10-CM | POA: Insufficient documentation

## 2012-01-24 LAB — SYNOVIAL CELL COUNT + DIFF, W/ CRYSTALS
Eosinophils-Synovial: 0 % (ref 0–1)
Lymphocytes-Synovial Fld: 15 % (ref 0–20)
Neutrophil, Synovial: 9 % (ref 0–25)

## 2012-01-24 LAB — BASIC METABOLIC PANEL
CO2: 28 mEq/L (ref 19–32)
Chloride: 107 mEq/L (ref 96–112)
Potassium: 3.9 mEq/L (ref 3.5–5.1)
Sodium: 143 mEq/L (ref 135–145)

## 2012-01-24 LAB — BRAIN NATRIURETIC PEPTIDE: Pro B Natriuretic peptide (BNP): 356 pg/mL — ABNORMAL HIGH (ref 0.0–100.0)

## 2012-01-24 LAB — GRAM STAIN

## 2012-01-24 MED ORDER — MORPHINE SULFATE 4 MG/ML IJ SOLN
4.0000 mg | Freq: Once | INTRAMUSCULAR | Status: AC
  Administered 2012-01-24: 4 mg via INTRAMUSCULAR
  Filled 2012-01-24: qty 1

## 2012-01-24 MED ORDER — LIDOCAINE HCL (PF) 1 % IJ SOLN
5.0000 mL | Freq: Once | INTRAMUSCULAR | Status: DC
  Filled 2012-01-24: qty 5

## 2012-01-24 MED ORDER — OXYCODONE-ACETAMINOPHEN 5-325 MG PO TABS: 2.0000 | ORAL_TABLET | Freq: Four times a day (QID) | ORAL | Status: DC | PRN

## 2012-01-24 MED ORDER — OXYCODONE-ACETAMINOPHEN 5-325 MG PO TABS
2.0000 | ORAL_TABLET | Freq: Once | ORAL | Status: DC
  Filled 2012-01-24: qty 2

## 2012-01-24 NOTE — ED Notes (Signed)
Pt presents with 2 day h/o L knee pain.  Pt denies any injury, reports h/o gout but normally in his foot.  Pt reports swelling "on the inside".

## 2012-01-24 NOTE — ED Notes (Addendum)
Pt states that he has had the right  knee pain for 2 days now. Pt states he took 2 aleve yesterday morning unaware of time but states the aleve did not work to relieve. Pt states he has not taken anything for pain today. Pt states his currently pain is at a 10. No visible swelling or deformity present. Pt states " I know it is Gout" Pt states he has gout in his right and left foot presently. Denies injury to knee.

## 2012-01-24 NOTE — ED Provider Notes (Signed)
History   This chart was scribed for Forbes Cellar, MD by Charolett Bumpers . The patient was seen in room TR04C/TR04C.    CSN: [id number]  Arrival date & time 01/24/12  1058   First MD Initiated Contact with Patient 01/24/12 1141      Chief Complaint  Patient presents with  . Knee Pain    (Consider location/radiation/quality/duration/timing/severity/associated sxs/prior treatment) HPI Eric Murillo is a 51 y.o. male who presents to the Emergency Department complaining of constant, moderate left knee pain with an onset of yesterday. Patient reports a h/o gout and states that his gout normally affects his toes. Patient denies any h/o gout in his knees. Patient states that he is able to ambulate with pain. Patient rates his knee pain a 10/10. Patient describes his knee pain as throbbing and soreness. Patient states that his symptoms are aggravated with ambulating. Patient states that his knee pain is worse after sitting for long periods of time and then walking. Patient states he took Aleve with no relief. Patient denies any known injuries or recent falls. Patient denies any associated numbness/tingling or weakness. Patient denies any fevers or chills. Patient denies taking any blood thinners. Patient states that he takes an aspirin daily. Patient reports a h/o CKD, HTN, CHF, and an enlarged heart. Patient reports taking prednisone and Vicodin for his gout previously.   Past Medical History  Diagnosis Date  . Hypertension   . Hyperlipidemia   . CHF (congestive heart failure)     EF 50-55%    . Erectile dysfunction   . CKD (chronic kidney disease)   . Obesity   . Gout     Past Surgical History  Procedure Date  . Colonoscopy     Family History  Problem Relation Age of Onset  . Hypertension Mother   . Hypertension Father   . Cancer Father     brother died of brain cancer; sister died of bone cancer  . Diabetes Sister   . Diabetes Brother   . Colon cancer Maternal Aunt     . Cardiomyopathy Neg Hx     History  Substance Use Topics  . Smoking status: Never Smoker   . Smokeless tobacco: Never Used  . Alcohol Use: No      Review of Systems A complete 10 system review of systems was obtained and all systems are negative except as noted in the HPI and PMH.   Allergies  Beta adrenergic blockers  Home Medications   Current Outpatient Rx  Name Route Sig Dispense Refill  . ASPIRIN 81 MG PO TABS Oral Take 81 mg by mouth daily.    Marland Kitchen CARVEDILOL 25 MG PO TABS Oral Take 25 mg by mouth 2 (two) times daily with a meal.    . ENALAPRIL MALEATE 20 MG PO TABS  TAKE TWO TABLETS BY MOUTH EVERY DAY 60 tablet 11  . FUROSEMIDE 40 MG PO TABS  Lasix 80mg  every morning and 40mg  every evening 30 tablet 1  . SIMVASTATIN 40 MG PO TABS Oral Take 1 tablet (40 mg total) by mouth at bedtime. 30 tablet 1  . SPIRONOLACTONE 25 MG PO TABS Oral Take 25 mg by mouth daily.    . OXYCODONE-ACETAMINOPHEN 5-325 MG PO TABS Oral Take 2 tablets by mouth every 6 (six) hours as needed for pain. 20 tablet 0    BP 161/74  Pulse 80  Temp 97.1 F (36.2 C) (Oral)  Resp 20  Ht 5\' 10"  (1.778 m)  Wt  323 lb (146.512 kg)  BMI 46.35 kg/m2  SpO2 97%  Physical Exam  Nursing note and vitals reviewed. Constitutional: He is oriented to person, place, and time. He appears well-developed and well-nourished. No distress.  HENT:  Head: Normocephalic and atraumatic.       Mucous membranes moist.   Eyes: EOM are normal. Pupils are equal, round, and reactive to light.  Neck: Neck supple. No tracheal deviation present.  Cardiovascular: Regular rhythm and normal heart sounds.  Tachycardia present.   Pulmonary/Chest: Effort normal and breath sounds normal. No respiratory distress. He has no wheezes.  Abdominal: Soft. Bowel sounds are normal. He exhibits no distension. There is no tenderness.  Musculoskeletal: Normal range of motion. He exhibits tenderness. He exhibits no edema.       Diffuse, minimal warmth  noted of left knee. No erythema. Diffuse tenderness to palpation of left knee. Minimal joint effusion. Posterior tibial pulse intact. Gross sensation intact of LLE.   Neurological: He is alert and oriented to person, place, and time. No sensory deficit.  Skin: Skin is warm and dry.  Psychiatric: He has a normal mood and affect. His behavior is normal.    ED Course  Procedures (including critical care time)  DIAGNOSTIC STUDIES: Oxygen Saturation is 97% on room air, normal by my interpretation.    COORDINATION OF CARE:  11:55am-Discussed planned course of treatment with the patient, who is agreeable at this time. Will order x-ray of left knee and preform an arthrocentesis. Patient is agreeable.  12:15pm-Medication Orders: Morphine 4 mg/mL injection 4 mg-once; Lidocaine (Xylocaine) 1 % injection 5 mL-once 1:26pm-Informed patient of imaging results and preformed arthrocentesis of left knee without any immediate complications.  4:09pm-Informed patient of lab results and planned d/c.  Knee Arthrocentesis Procedure Note Date:05/11/2011  At 1:26PM Indication: Effusion, joint pain Procedure performed by: Forbes Cellar, MD Site: Left knee Technique: medial Sterile procedures observed Time-out performed prior to start of procedure and correct patient, site and procedure verified.  Anesthetic used: 1% Lidocaine, 5 mL Fluid aspirated (amount): 20 cc Appearance of Fluid: clear  Patient's knee was placed into an appropriate position prior to introduction of needle. Patient tolerated procedure well with no complications. Blood loss was minimal.    Results for orders placed during the hospital encounter of 01/24/12  CELL COUNT + DIFF,  W/ CRYST-SYNVL FLD      Component Value Range   Color, Synovial YELLOW (*) YELLOW   Appearance-Synovial HAZY (*) CLEAR   Crystals, Fluid NO CRYSTALS SEEN     WBC, Synovial 176  0 - 200 /cu mm   Neutrophil, Synovial 9  0 - 25 %   Lymphocytes-Synovial Fld 15  0  - 20 %   Monocyte-Macrophage-Synovial Fluid 76  50 - 90 %   Eosinophils-Synovial 0  0 - 1 %  GRAM STAIN      Component Value Range   Specimen Description SYNOVIAL     Special Requests Normal     Gram Stain       Value: FEW WBC PRESENT, PREDOMINANTLY MONONUCLEAR     NO ORGANISMS SEEN   Report Status 01/24/2012 FINAL      Dg Knee Complete 4 Views Left  01/24/2012  *RADIOLOGY REPORT*  Clinical Data: Pain and swelling, no known injury  LEFT KNEE - COMPLETE 4+ VIEW  Comparison: None.  Findings: Four views of the left knee submitted.  No acute fracture or subluxation.  There is mild narrowing of medial joint compartment.  Minimal spurring  of the medial femoral condyle and medial tibial plateau.  Mild spurring of the patella.  Narrowing of patellofemoral joint space.  Question small joint effusion.  IMPRESSION: No acute fracture or subluxation.  Mild degenerative changes. Question small joint effusion.  Original Report Authenticated By: Natasha Mead, M.D.   1. Knee pain, left    MDM  Left knee pain, h/o gout but in toe only. Due to min warmth to palpation and pt with significantly dec ROM 2/2 pain an arthrocentesis was performed. Synovial WBC not significantly elevated. No crystals seen. Gm stain without organisms. Pain controlled in ED. Patient prescribed percocet. No NSAIDS due to reported h/o CRI. Will have him f/u with his PMD if sx persist. No EMC precluding discharge at this time. Given Precautions for return. PMD f/u.  I personally performed the services described in this documentation, which was scribed in my presence. The recorded information has been reviewed and considered.         Forbes Cellar, MD 01/25/12 [phone]

## 2012-01-26 ENCOUNTER — Ambulatory Visit (HOSPITAL_COMMUNITY)
Admission: RE | Admit: 2012-01-26 | Discharge: 2012-01-26 | Disposition: A | Discharge status: Home / Self Care | Payer: No Typology Code available for payment source | Source: Ambulatory Visit | Attending: Cardiology | Admitting: Cardiology

## 2012-01-26 DIAGNOSIS — I429 Cardiomyopathy, unspecified: Secondary | ICD-10-CM

## 2012-01-27 DIAGNOSIS — G4733 Obstructive sleep apnea (adult) (pediatric): Secondary | ICD-10-CM

## 2012-01-27 NOTE — Procedures (Signed)
NAME:  Eric Murillo, Eric Murillo NO.:  [id number]  MEDICAL RECORD NO.:  [id number]          PATIENT TYPE:  OUT  LOCATION:  SLEEP CENTER                 FACILITY[facility]  [facility]  PHYSICIAN:  Barbaraann Share, MD,FCCPDATE OF BIRTH:  May 29, 1961  DATE OF STUDY:  01/19/2012                           NOCTURNAL POLYSOMNOGRAM  REFERRING PHYSICIAN:  [name], MD  REFERRING PHYSICIAN:  [name], MD  INDICATION FOR STUDY:  Hypersomnia with sleep apnea.  EPWORTH SLEEPINESS SCORE:  20.  SLEEP ARCHITECTURE:  The patient had a total sleep time of 251 minutes with no slow-wave sleep and decreased quantity of REM.  Sleep onset left latency was prolonged at 36 minutes, and REM onset was very prolonged at 371 minutes.  Sleep efficiency was poor at 53%.  RESPIRATORY DATA:  The patient underwent a split night protocol where he was found to have 122 events and 121 minutes  sleep.  This gave him an apnea-hypopnea index of 61 per hour during the diagnostic portion of the study.  The events occurred in all body positions, there was loud snoring noted throughout.  By protocol, he was then fitted with a large ResMed Mirage FX Full Face Mask, and CPAP titration was initiated.  At a final pressure of 13 cm of water, the patient appeared to have adequate control of his obstructive events and snoring.  However, he had very little REM noted on the final pressure.  OXYGEN DATA:  There was O2 desaturation as low as 84% with the patient's obstructive events.  This normalized with CPAP therapy.  CARDIAC DATA:  Occasional PVC noted, but no clinically significant arrhythmias were seen.  MOVEMENTS/PARASOMNIA:  The patient had no significant leg jerks or other abnormal behaviors seen.  IMPRESSION/RECOMMENDATION: 1. Split night study reveals severe obstructive sleep apnea with an     AHI of 61 events per hour and oxygen desaturation as low as 84%     during the diagnostic portion of the study.  He was  then fitted     with a large ResMed Mirage FX full face mask, and found to have an     optimal CPAP pressure of 13 cm of water.  He should also be     encouraged to work aggressively on weight loss. 2. Occasional PVC noted, but no clinically significant arrhythmias     were seen.     Barbaraann Share, MD,FCCP Diplomate, American Board of Sleep Medicine    [facility]/MED[facility]  D:  01/27/2012 16:33:30  T:  01/27/2012 25:36:64  Job:  [id number]

## 2012-01-29 ENCOUNTER — Encounter (HOSPITAL_COMMUNITY): Payer: Self-pay | Admitting: *Deleted

## 2012-01-29 ENCOUNTER — Emergency Department (HOSPITAL_COMMUNITY)
Admission: EM | Admit: 2012-01-29 | Discharge: 2012-01-29 | Disposition: A | Discharge status: Home / Self Care | Payer: Self-pay | Attending: Emergency Medicine | Admitting: Emergency Medicine

## 2012-01-29 DIAGNOSIS — N529 Male erectile dysfunction, unspecified: Secondary | ICD-10-CM | POA: Insufficient documentation

## 2012-01-29 DIAGNOSIS — I509 Heart failure, unspecified: Secondary | ICD-10-CM | POA: Insufficient documentation

## 2012-01-29 DIAGNOSIS — Z7982 Long term (current) use of aspirin: Secondary | ICD-10-CM | POA: Insufficient documentation

## 2012-01-29 DIAGNOSIS — M79609 Pain in unspecified limb: Secondary | ICD-10-CM | POA: Insufficient documentation

## 2012-01-29 DIAGNOSIS — M25569 Pain in unspecified knee: Secondary | ICD-10-CM | POA: Insufficient documentation

## 2012-01-29 DIAGNOSIS — N189 Chronic kidney disease, unspecified: Secondary | ICD-10-CM | POA: Insufficient documentation

## 2012-01-29 DIAGNOSIS — M109 Gout, unspecified: Secondary | ICD-10-CM | POA: Insufficient documentation

## 2012-01-29 DIAGNOSIS — I129 Hypertensive chronic kidney disease with stage 1 through stage 4 chronic kidney disease, or unspecified chronic kidney disease: Secondary | ICD-10-CM | POA: Insufficient documentation

## 2012-01-29 DIAGNOSIS — E785 Hyperlipidemia, unspecified: Secondary | ICD-10-CM | POA: Insufficient documentation

## 2012-01-29 MED ORDER — HYDROCODONE-ACETAMINOPHEN 5-325 MG PO TABS: 1.0000 | ORAL_TABLET | Freq: Three times a day (TID) | ORAL | Status: AC | PRN

## 2012-01-29 MED ORDER — PREDNISONE 20 MG PO TABS: ORAL_TABLET | ORAL | Status: DC

## 2012-01-29 MED ORDER — HYDROCODONE-ACETAMINOPHEN 5-325 MG PO TABS
1.0000 | ORAL_TABLET | Freq: Once | ORAL | Status: AC
  Administered 2012-01-29: 1 via ORAL
  Filled 2012-01-29: qty 1

## 2012-01-29 NOTE — ED Notes (Signed)
Pt states that he has been having left knee pain on fourth. Pt also complaining of right foot pain with possible gout, pt states foot swollen.

## 2012-01-29 NOTE — ED Provider Notes (Signed)
History     CSN: [id number]  Arrival date & time 01/29/12  0700   First MD Initiated Contact with Patient 01/29/12 [phone]      Chief Complaint  Patient presents with  . Knee Pain  . Foot Pain    (Consider location/radiation/quality/duration/timing/severity/associated sxs/prior treatment) HPI Comments: Eric Murillo 51 y.o. male   The chief complaint is: Patient presents with:   Knee Pain   Foot Pain   The patient has medical history significant for:   Past Medical History:   Hypertension                                                 Hyperlipidemia                                               CHF (congestive heart failure)                                 Comment:EF 50-55%     Erectile dysfunction                                         CKD (chronic kidney disease)                                 Obesity                                                      Gout                                                        The onset of the foot painsymptoms was  gradual starting 2 day ago, the pain is located in right great toe. The knee pain began seven days ago and is a chronic problem. The course is persistent, gradually worsened and is rated 9/10 on pain scale. Walking makes symptoms worse. Rest, Prednisone, and Norco makes symptoms better. Has no associated symptoms. Patient states he unable to take medications for gout due to renal insufficiency. Patient was seen on 01/24/12 for the same knee pain and aspiration fluid proved negative for crystals, gout, or organisms that would lead to the suspicion of a septic joint. Denies fever, chills. Denies SOB. Reports difficulty ambulating and requires crutches.      Patient is a 51 y.o. male presenting with knee pain and lower extremity pain.  Knee Pain Pertinent negatives include no abdominal pain, arthralgias, chills or fever.  Foot Pain Pertinent negatives include no abdominal pain, arthralgias, chills or fever.    Past  Medical History  Diagnosis Date  . Hypertension   . Hyperlipidemia   . CHF (congestive heart  failure)     EF 50-55%    . Erectile dysfunction   . CKD (chronic kidney disease)   . Obesity   . Gout     Past Surgical History  Procedure Date  . Colonoscopy     Family History  Problem Relation Age of Onset  . Hypertension Mother   . Hypertension Father   . Cancer Father     brother died of brain cancer; sister died of bone cancer  . Diabetes Sister   . Diabetes Brother   . Colon cancer Maternal Aunt   . Cardiomyopathy Neg Hx     History  Substance Use Topics  . Smoking status: Never Smoker   . Smokeless tobacco: Never Used  . Alcohol Use: No      Review of Systems  Constitutional: Negative for fever and chills.  Respiratory: Negative for shortness of breath.   Cardiovascular: Negative for leg swelling.  Gastrointestinal: Negative for abdominal pain.  Musculoskeletal: Negative for arthralgias.    Allergies  Beta adrenergic blockers  Home Medications   Current Outpatient Rx  Name Route Sig Dispense Refill  . ASPIRIN 81 MG PO TABS Oral Take 81 mg by mouth daily.    Marland Kitchen CARVEDILOL 25 MG PO TABS Oral Take 25 mg by mouth 2 (two) times daily with a meal.    . ENALAPRIL MALEATE 20 MG PO TABS Oral Take 40 mg by mouth daily.    . FUROSEMIDE 40 MG PO TABS Oral Take 40-80 mg by mouth 2 (two) times daily. Takes 2 tablets in the morning and 1 tablet in the afternoon    . SIMVASTATIN 40 MG PO TABS Oral Take 1 tablet (40 mg total) by mouth at bedtime. 30 tablet 1  . SPIRONOLACTONE 25 MG PO TABS Oral Take 25 mg by mouth daily.      BP 137/84  Pulse 104  Temp 98.7 F (37.1 C) (Oral)  Resp 22  SpO2 97%  Physical Exam  Constitutional: He appears well-developed and well-nourished.  HENT:  Head: Normocephalic and atraumatic.  Cardiovascular: Normal rate, regular rhythm and normal heart sounds.   Pulmonary/Chest: Effort normal and breath sounds normal.  Abdominal: Soft.  Bowel sounds are normal. There is no tenderness.  Musculoskeletal:       Left knee: He exhibits decreased range of motion and swelling. tenderness found.       Feet:       Distal pulses intact.  Neurological: He is alert.  Skin: Skin is dry.    ED Course  Procedures (including critical care time)  Labs Reviewed - No data to display No results found.   No diagnosis found.    MDM  51 y/o male with a history of frequent gouty attacks. Presented 01/24/12 for L knee pain thought to be a gout flair. Aspiration and fluid analaysis negative for crystals or infectious etiology. Patient will be discharged with steroids,pain management, and follow-up with orthopedics for the L knee. The pain in the great R toe does lean towards a clinical suspicion of gout. Pain was rated 9/10. Patient was asked about pain management and given Norco. Patient will be discharged on prednisone and pain medication. Discussed with internal medicine resident, who will arrange outpatient recheck appointment.        Pixie Casino, PA-C 01/29/12 1539

## 2012-01-30 ENCOUNTER — Other Ambulatory Visit: Payer: Self-pay | Admitting: *Deleted

## 2012-01-30 DIAGNOSIS — G4730 Sleep apnea, unspecified: Secondary | ICD-10-CM

## 2012-01-30 NOTE — ED Provider Notes (Signed)
Medical screening examination/treatment/procedure(s) were performed by non-physician practitioner and as supervising physician I was immediately available for consultation/collaboration.   Carleene Cooper III, MD 01/30/12 1044

## 2012-02-06 ENCOUNTER — Encounter: Payer: Self-pay | Admitting: Internal Medicine

## 2012-02-06 ENCOUNTER — Ambulatory Visit (INDEPENDENT_AMBULATORY_CARE_PROVIDER_SITE_OTHER): Payer: Self-pay | Admitting: Dietician

## 2012-02-06 ENCOUNTER — Ambulatory Visit (INDEPENDENT_AMBULATORY_CARE_PROVIDER_SITE_OTHER): Payer: Self-pay | Admitting: Internal Medicine

## 2012-02-06 VITALS — BP 154/94 | HR 65 | Temp 97.9°F | Ht 69.5 in | Wt 324.3 lb

## 2012-02-06 DIAGNOSIS — M109 Gout, unspecified: Secondary | ICD-10-CM

## 2012-02-06 DIAGNOSIS — E663 Overweight: Secondary | ICD-10-CM

## 2012-02-06 DIAGNOSIS — I10 Essential (primary) hypertension: Secondary | ICD-10-CM

## 2012-02-06 DIAGNOSIS — N259 Disorder resulting from impaired renal tubular function, unspecified: Secondary | ICD-10-CM

## 2012-02-06 DIAGNOSIS — R739 Hyperglycemia, unspecified: Secondary | ICD-10-CM

## 2012-02-06 DIAGNOSIS — E669 Obesity, unspecified: Secondary | ICD-10-CM

## 2012-02-06 DIAGNOSIS — I509 Heart failure, unspecified: Secondary | ICD-10-CM

## 2012-02-06 DIAGNOSIS — G4733 Obstructive sleep apnea (adult) (pediatric): Secondary | ICD-10-CM

## 2012-02-06 DIAGNOSIS — N289 Disorder of kidney and ureter, unspecified: Secondary | ICD-10-CM

## 2012-02-06 DIAGNOSIS — Z79899 Other long term (current) drug therapy: Secondary | ICD-10-CM

## 2012-02-06 DIAGNOSIS — Z6841 Body Mass Index (BMI) 40.0 and over, adult: Secondary | ICD-10-CM

## 2012-02-06 LAB — POCT GLYCOSYLATED HEMOGLOBIN (HGB A1C): Hemoglobin A1C: 5.9

## 2012-02-06 LAB — URIC ACID: Uric Acid, Serum: 9.1 mg/dL — ABNORMAL HIGH (ref 4.0–7.8)

## 2012-02-06 MED ORDER — ENALAPRIL MALEATE 20 MG PO TABS: 40.0000 mg | ORAL_TABLET | Freq: Every day | ORAL | Status: DC

## 2012-02-06 NOTE — Assessment & Plan Note (Addendum)
Creatinine stable at 1.5. 

## 2012-02-06 NOTE — Patient Instructions (Addendum)
--Follow up with you cardiologist, Dr. Shirlee Latch, you have an appointment on 8/22 at 8:30AM --Follow up in 6 weeks.  Gout Gout is an inflammatory condition (arthritis) caused by a buildup of uric acid crystals in the joints. Uric acid is a chemical that is normally present in the blood. Under some circumstances, uric acid can form into crystals in your joints. This causes joint redness, soreness, and swelling (inflammation). Repeat attacks are common. Over time, uric acid crystals can form into masses (tophi) near a joint, causing disfigurement. Gout is treatable and often preventable. CAUSES  The disease begins with elevated levels of uric acid in the blood. Uric acid is produced by your body when it breaks down a naturally found substance called purines. This also happens when you eat certain foods such as meats and fish. Causes of an elevated uric acid level include:  Being passed down from parent to child (heredity).   Diseases that cause increased uric acid production (obesity, psoriasis, some cancers).   Excessive alcohol use.   Diet, especially diets rich in meat and seafood.   Medicines, including certain cancer-fighting drugs (chemotherapy), diuretics, and aspirin.   Chronic kidney disease. The kidneys are no longer able to remove uric acid well.   Problems with metabolism.  Conditions strongly associated with gout include:  Obesity.   High blood pressure.   High cholesterol.   Diabetes.  Not everyone with elevated uric acid levels gets gout. It is not understood why some people get gout and others do not. Surgery, joint injury, and eating too much of certain foods are some of the factors that can lead to gout. SYMPTOMS   An attack of gout comes on quickly. It causes intense pain with redness, swelling, and warmth in a joint.   Fever can occur.   Often, only one joint is involved. Certain joints are more commonly involved:   Base of the big toe.   Knee.   Ankle.    Wrist.   Finger.  Without treatment, an attack usually goes away in a few days to weeks. Between attacks, you usually will not have symptoms, which is different from many other forms of arthritis. DIAGNOSIS  Your caregiver will suspect gout based on your symptoms and exam. Removal of fluid from the joint (arthrocentesis) is done to check for uric acid crystals. Your caregiver will give you a medicine that numbs the area (local anesthetic) and use a needle to remove joint fluid for exam. Gout is confirmed when uric acid crystals are seen in joint fluid, using a special microscope. Sometimes, blood, urine, and X-ray tests are also used. TREATMENT  There are 2 phases to gout treatment: treating the sudden onset (acute) attack and preventing attacks (prophylaxis). Treatment of an Acute Attack  Medicines are used. These include anti-inflammatory medicines or steroid medicines.   An injection of steroid medicine into the affected joint is sometimes necessary.   The painful joint is rested. Movement can worsen the arthritis.   You may use warm or cold treatments on painful joints, depending which works best for you.   Discuss the use of coffee, vitamin C, or cherries with your caregiver. These may be helpful treatment options.  Treatment to Prevent Attacks After the acute attack subsides, your caregiver may advise prophylactic medicine. These medicines either help your kidneys eliminate uric acid from your body or decrease your uric acid production. You may need to stay on these medicines for a very long time. The early phase of treatment  with prophylactic medicine can be associated with an increase in acute gout attacks. For this reason, during the first few months of treatment, your caregiver may also advise you to take medicines usually used for acute gout treatment. Be sure you understand your caregiver's directions. You should also discuss dietary treatment with your caregiver. Certain foods  such as meats and fish can increase uric acid levels. Other foods such as dairy can decrease levels. Your caregiver can give you a list of foods to avoid. HOME CARE INSTRUCTIONS   Do not take aspirin to relieve pain. This raises uric acid levels.   Only take over-the-counter or prescription medicines for pain, discomfort, or fever as directed by your caregiver.   Rest the joint as much as possible. When in bed, keep sheets and blankets off painful areas.   Keep the affected joint raised (elevated).   Use crutches if the painful joint is in your leg.   Drink enough water and fluids to keep your urine clear or pale yellow. This helps your body get rid of uric acid. Do not drink alcoholic beverages. They slow the passage of uric acid.   Follow your caregiver's dietary instructions. Pay careful attention to the amount of protein you eat. Your daily diet should emphasize fruits, vegetables, whole grains, and fat-free or low-fat milk products.   Maintain a healthy body weight.  SEEK MEDICAL CARE IF:   You have an oral temperature above 102 F (38.9 C).   You develop diarrhea, vomiting, or any side effects from medicines.   You do not feel better in 24 hours, or you are getting worse.  SEEK IMMEDIATE MEDICAL CARE IF:   Your joint becomes suddenly more tender and you have:   Chills.   An oral temperature above 102 F (38.9 C), not controlled by medicine.  MAKE SURE YOU:   Understand these instructions.   Will watch your condition.   Will get help right away if you are not doing well or get worse.  Document Released: 07/07/2000 Document Revised: 06/29/2011 Document Reviewed: 10/18/2009 [facility] Patient Information 2012 [place], [place].

## 2012-02-06 NOTE — Assessment & Plan Note (Signed)
Patient aware of gradual weight gain. He is very interested in losing weight through diet and exercise. He would like a referral to meet with the diabetes educator today.  Will give him a referral to speak to [employer] today

## 2012-02-06 NOTE — Patient Instructions (Addendum)
Please make a follow up in 3-4 weeks with Norm Parcel.  Call if you have questions [phone]   DASH Diet The DASH diet stands for "Dietary Approaches to Stop Hypertension." It is a healthy eating plan that has been shown to reduce high blood pressure (hypertension) in as little as 14 days, while also possibly providing other significant health benefits. These other health benefits include reducing the risk of breast cancer after menopause and reducing the risk of type 2 diabetes, heart disease, colon cancer, and stroke. Health benefits also include weight loss and slowing kidney failure in patients with chronic kidney disease.   DIET GUIDELINES  Limit salt (sodium). Your diet should contain less than 1500 mg of sodium daily.   Limit refined or processed carbohydrates. Your diet should include mostly whole grains. Desserts and added sugars should be used sparingly.   Include small amounts of heart-healthy fats. These types of fats include nuts, oils, and tub margarine. Limit saturated and trans fats. These fats have been shown to be harmful in the body.   CHOOSING FOODS  The following food groups are based on a 2000 calorie diet. See your Registered Dietitian for individual calorie needs.  Grains and Grain Products (6 to 8 servings daily)  Eat More Often: Whole-wheat bread, brown rice, whole-grain or wheat pasta, quinoa, popcorn without added fat or salt (air popped).   Eat Less Often: White bread, white pasta, white rice, cornbread.   Vegetables (4 to 5 servings daily)  Eat More Often: Fresh, frozen, and canned vegetables. Vegetables may be raw, steamed, roasted, or grilled with a minimal amount of fat.   Eat Less Often/Avoid: Creamed or fried vegetables. Vegetables in a cheese sauce.   Fruit (4 to 5 servings daily)  Eat More Often: All fresh, canned (in natural juice), or frozen fruits. Dried fruits without added sugar. One hundred percent fruit juice ( cup [237 mL] daily).   Eat  Less Often: Dried fruits with added sugar. Canned fruit in light or heavy syrup.   [employer], Fish, and Poultry (2 servings or less daily. One serving is 3 to 4 oz [85-114 g]).  Eat More Often: Ninety percent or leaner ground beef, tenderloin, sirloin. Round cuts of beef, chicken breast, [place] breast. All fish. Grill, bake, or broil your meat. Nothing should be fried.   Eat Less Often/Avoid: Fatty cuts of meat, [place], or chicken leg, thigh, or wing. Fried cuts of meat or fish.   Dairy (2 to 3 servings)  Eat More Often: Low-fat or fat-free milk, low-fat plain or light yogurt, reduced-fat or part-skim cheese.   Eat Less Often/Avoid: Milk (whole, 2%, skim, or chocolate).Whole milk yogurt. Full-fat cheeses.   Nuts, Seeds, and Legumes (4 to 5 servings per week)  Eat More Often: All without added salt.   Eat Less Often/Avoid: Salted nuts and seeds, canned beans with added salt.   Fats and Sweets (limited)  Eat More Often: Vegetable oils, tub margarines without trans fats, sugar-free gelatin. Mayonnaise and salad dressings.   Eat Less Often/Avoid: Coconut oils, palm oils, butter, stick margarine, cream, half and half, cookies, candy, pie.  FOR MORE INFORMATION       .

## 2012-02-06 NOTE — Assessment & Plan Note (Signed)
Patient's recent echo on 01/09/2012 showed EF between 35-40%. Patient being followed by Dr. Shirlee Latch cardiology with next appointment on 03/14/2012. Advised patient to continue taking his medication as prescribed by Dr. Shirlee Latch to prevent edema and shortness of breath. Patient reported no shortness of breath today but does have 1+ pitting edema of the the lower extremities bilaterally. Patient aware that Lasix may increase the recurrence of gout attacks but he is also aware that his diet may also induce recurrent gout. No medication changes at this time

## 2012-02-06 NOTE — Assessment & Plan Note (Addendum)
Patient hypertensive today however he did not take his blood pressure medications today. Will not change his current blood pressure medication.  Will reorder his enalapril as a print out so [facility].

## 2012-02-06 NOTE — Progress Notes (Signed)
Medical Nutrition Therapy:  Appt start time: 1400 end time:  1440.  Assessment:  Primary concerns today: Weight management.  Usual eating pattern includes 3 meals and 3 snacks per day. Patient very concerned about weight gain and eating healthier. Usual physical activity includes very limited now due to gout- used to run and lift weights. Everyday foods include whole milk, eggs, fried foods, canned vegetables.  Avoided foods include Kiwi, grapes, shellfish due to gout.   Labs noted with low HDL   Progress Towards Goal(s):  In progress.   Nutritional Diagnosis:  NB-1.1 Food and nutrition-related knowledge deficit As related to lack of previous exposure to healthy lifestyle education.  As evidenced by his report.    Intervention:   1- Nutrition education on healthy meal planning using plate method. 2- Nutrition education about low HDL and effects of trans fats on HDL. 3- Nutrition education on importance of 30 minutes daily of physical activity such as walking. 4- Nutrition education about healthier cooking methods, recipes provided.  Monitoring/Evaluation:  Dietary intake, exercise, and body weight in 3 week(s).

## 2012-02-06 NOTE — Progress Notes (Signed)
  Subjective:    Patient ID: Eric Murillo, male    DOB: December 25, 1960, 51 y.o.   MRN: [id number]  HPI Eric Murillo is a 51 year old gentleman with past medical history significant for hypertension, renal insufficiency, morbid obesity, congestive heart failure, and gout who is here today for followup visit from the ED for a gout flareup. He was seen last week in the ED was given Narco for pain and a short course of prednisone 20 mg. Today he tells me that the pain has decreased significantly and he is able to move his right toe and walk with no pain. He finished his prednisone course and his last dose was 02/04/2012. This is at least his third gout flareups this year requiring an ED visit. He was told in the past he could not be on a preventative and gout medications because of his renal insufficiency and therefore has never been on anti-gout medications. She is on therapy with furosemide which is prescribed to Dr. Shirlee Latch his cardiologist as part of his congestive heart failure medication regimen. He is aware that his diet may also induce gout flareups but would like more education on what type of foods to avoid. In addition he is very interested in learning more about a diet to help him lose weight.  He  is hypertensive today but reports not taking any of his medicines this morning.  He has no other complaints today  Review of Systems  Constitutional: Negative for fever, chills, diaphoresis, activity change, appetite change and unexpected weight change.  Respiratory: Positive for apnea. Negative for cough, shortness of breath and wheezing.   Cardiovascular: Positive for leg swelling. Negative for chest pain and palpitations.  Neurological: Negative for dizziness, light-headedness and headaches.  Psychiatric/Behavioral: Negative for behavioral problems.       Objective:   Physical Exam  Constitutional: He is oriented to person, place, and time. He appears well-developed. No distress.  HENT:  Head:  Normocephalic and atraumatic.  Eyes: Conjunctivae are normal. No scleral icterus.  Cardiovascular: Normal rate.        Distant heart sounds secondary to body habitus  Pulmonary/Chest: Effort normal and breath sounds normal. No respiratory distress. He has no wheezes. He has no rales. He exhibits no tenderness.  Musculoskeletal: Normal range of motion. He exhibits edema and tenderness.       Podagra of right foot, improving, with minimum erythema and edema but no warmth. 1+ pitting edema bilaterally up to his knees.   Neurological: He is alert and oriented to person, place, and time.  Skin: Skin is warm and dry. He is not diaphoretic.  Psychiatric: He has a normal mood and affect. His behavior is normal.          Assessment & Plan:

## 2012-02-08 ENCOUNTER — Telehealth: Payer: Self-pay | Admitting: *Deleted

## 2012-02-08 NOTE — Telephone Encounter (Signed)
Pt calls and states he would like the medicine for gout that was dicussed at his visit, could you please fax this script to the [facility]. Also please add to med list. Thank you

## 2012-02-09 ENCOUNTER — Other Ambulatory Visit: Payer: Self-pay | Admitting: *Deleted

## 2012-02-09 ENCOUNTER — Other Ambulatory Visit: Payer: Self-pay | Admitting: Internal Medicine

## 2012-02-09 DIAGNOSIS — E785 Hyperlipidemia, unspecified: Secondary | ICD-10-CM

## 2012-02-09 DIAGNOSIS — Z0000 Encounter for general adult medical examination without abnormal findings: Secondary | ICD-10-CM

## 2012-02-09 DIAGNOSIS — M109 Gout, unspecified: Secondary | ICD-10-CM

## 2012-02-09 DIAGNOSIS — N259 Disorder resulting from impaired renal tubular function, unspecified: Secondary | ICD-10-CM

## 2012-02-09 DIAGNOSIS — I10 Essential (primary) hypertension: Secondary | ICD-10-CM

## 2012-02-09 DIAGNOSIS — E663 Overweight: Secondary | ICD-10-CM

## 2012-02-09 DIAGNOSIS — I499 Cardiac arrhythmia, unspecified: Secondary | ICD-10-CM

## 2012-02-09 DIAGNOSIS — N529 Male erectile dysfunction, unspecified: Secondary | ICD-10-CM

## 2012-02-09 MED ORDER — ALLOPURINOL 100 MG PO TABS: 100.0000 mg | ORAL_TABLET | Freq: Every day | ORAL | Status: DC

## 2012-02-09 MED ORDER — SIMVASTATIN 40 MG PO TABS: 40.0000 mg | ORAL_TABLET | Freq: Every day | ORAL | Status: DC

## 2012-02-09 NOTE — Progress Notes (Signed)
Per last visit, discussed that pt could take allopurinol 100 mg daily (renally dosed given his elevated Cr) to prevent recurrent gout attacks. He refused the medication at that time but called a day later to request this prescription.   --faxed prescription for allopurinol 100mg  daily to the [facility]

## 2012-02-13 ENCOUNTER — Other Ambulatory Visit: Payer: Self-pay | Admitting: *Deleted

## 2012-02-13 MED ORDER — FUROSEMIDE 40 MG PO TABS: 40.0000 mg | ORAL_TABLET | Freq: Two times a day (BID) | ORAL | Status: DC

## 2012-02-13 NOTE — Telephone Encounter (Signed)
Called to [facility], pt and pharm state [facility] will be [facility] now on

## 2012-02-13 NOTE — Addendum Note (Signed)
Addended by: Neomia Dear on: 02/13/2012 06:42 AM   Modules accepted: Orders

## 2012-02-14 ENCOUNTER — Institutional Professional Consult (permissible substitution): Payer: Self-pay | Admitting: Pulmonary Disease

## 2012-02-16 NOTE — Progress Notes (Signed)
I saw, examined, and discussed the patient with Dr Garald Braver and agree with the note contained here. We discussed preventative treatments to decrease the likelihood of gout flares but pt did not want to start at this time.

## 2012-02-29 ENCOUNTER — Encounter: Payer: Self-pay | Admitting: *Deleted

## 2012-02-29 ENCOUNTER — Telehealth: Payer: Self-pay | Admitting: *Deleted

## 2012-02-29 NOTE — Telephone Encounter (Signed)
RN called Hilda Lias at [facility] to see if CPAP had been set up per 02/06/2012 orders faxed to them. Hilda Lias states [facility] has made several phone calls to the pt's phone and left messages with pt's mother that pt needed to call [facility] back to get his free CPAP set up but pt never returned their calls. Letter sent by this RN to pt's home asking pt to contact [facility] directly to get his CPAP set up. Dorie Rank, RN, 02/29/2012,4:28P

## 2012-03-14 ENCOUNTER — Encounter: Payer: Self-pay | Admitting: Cardiology

## 2012-03-14 ENCOUNTER — Ambulatory Visit (INDEPENDENT_AMBULATORY_CARE_PROVIDER_SITE_OTHER): Payer: Self-pay | Admitting: Cardiology

## 2012-03-14 VITALS — BP 126/74 | HR 95 | Ht 69.0 in | Wt 323.0 lb

## 2012-03-14 DIAGNOSIS — N259 Disorder resulting from impaired renal tubular function, unspecified: Secondary | ICD-10-CM

## 2012-03-14 DIAGNOSIS — N529 Male erectile dysfunction, unspecified: Secondary | ICD-10-CM

## 2012-03-14 DIAGNOSIS — I5020 Unspecified systolic (congestive) heart failure: Secondary | ICD-10-CM

## 2012-03-14 DIAGNOSIS — E785 Hyperlipidemia, unspecified: Secondary | ICD-10-CM

## 2012-03-14 DIAGNOSIS — E663 Overweight: Secondary | ICD-10-CM

## 2012-03-14 DIAGNOSIS — Z0000 Encounter for general adult medical examination without abnormal findings: Secondary | ICD-10-CM

## 2012-03-14 DIAGNOSIS — G4733 Obstructive sleep apnea (adult) (pediatric): Secondary | ICD-10-CM

## 2012-03-14 DIAGNOSIS — I10 Essential (primary) hypertension: Secondary | ICD-10-CM

## 2012-03-14 DIAGNOSIS — I499 Cardiac arrhythmia, unspecified: Secondary | ICD-10-CM

## 2012-03-14 MED ORDER — CARVEDILOL 25 MG PO TABS: 25.0000 mg | ORAL_TABLET | Freq: Two times a day (BID) | ORAL | Status: DC

## 2012-03-14 MED ORDER — SPIRONOLACTONE 25 MG PO TABS: 25.0000 mg | ORAL_TABLET | Freq: Every day | ORAL | Status: DC

## 2012-03-14 MED ORDER — ENALAPRIL MALEATE 20 MG PO TABS: 40.0000 mg | ORAL_TABLET | Freq: Every day | ORAL | Status: DC

## 2012-03-14 MED ORDER — ISOSORB DINITRATE-HYDRALAZINE 20-37.5 MG PO TABS: ORAL_TABLET | ORAL | Status: DC

## 2012-03-14 MED ORDER — FUROSEMIDE 40 MG PO TABS: 40.0000 mg | ORAL_TABLET | Freq: Two times a day (BID) | ORAL | Status: DC

## 2012-03-14 MED ORDER — SIMVASTATIN 40 MG PO TABS: 40.0000 mg | ORAL_TABLET | Freq: Every day | ORAL | Status: DC

## 2012-03-14 NOTE — Patient Instructions (Addendum)
Start Bidil 20/37.5mg  one half tablet three times a day.  Your physician recommends that you schedule a follow-up appointment in: 3 months with Dr Shirlee Latch  Your physician recommends that you return for lab work in: 3 months when you see Dr Frutoso Chase [phone]

## 2012-03-14 NOTE — Assessment & Plan Note (Signed)
Stable mild reduction in GFR.

## 2012-03-14 NOTE — Progress Notes (Signed)
Patient ID: Eric Murillo, male   DOB: 09/07/1960, 51 y.o.   MRN: [id number] PCP: Dr. Garald Braver  51 yo with history of obesity, hyperlipidemia, HTN, and nonischemic cardiomyopathy with CHF returns for cardiology followup.  He says he has been doing fairly well.  Last echo in 6/13 showed EF 35-40%.  I tried to set him up for a cardiac MRI to more closely quantify EF to see if he was in ICD range, but he did not fit in the magnet.  He was recently diagnosed with severe OSA but has not yet started CPAP.  His symptoms are stable.  He says he could walk one mile but it would be hard.  He is short of breath after walking up 2 flights of steps.  No PND, no chest pain.  Weight is stable.   Labs (3/12): K 4.1, creatinine 1.5, LDL 91, HDL 29, LFTs normal, BNP 260, HCT 43 Labs (6/12): K 4, creatinine 1.4, HIV negative, TSH normal, ANA normal, SPEP negative Labs (4/13): LDL 75, HDL 30, K 4.2, creatinine 7.82 Labs (7/13): BNP 356, K 3.9, creatinine 1.5  Past Medical History: 1. Nonischemic cardiomyopathy: Echo (3/12) with EF 30-35% and mildly dilated LV, diffuse LV hypokinesis, moderate MR, PA systolic pressure 55 mmHg.  Left and right heart cath (3/12): No angiographic CAD; mean RA 20, PA 76/41, mean PCWP 38, CI 2.1.  ANA, SPEP, and HIV negative.  TSH normal.  Denies drug abuse, heavy ETOH intake.  No family history of cardiomyopathy.  Cardiomyopathy may be due to long-standing HTN.  Echo (6/13): EF 35-40%, moderate LV dilation, moderate to severe LAE, PA systolic pressure 52 mmHg.  Unable to fit in magnet for cardiac MRI.  2. ERECTILE DYSFUNCTION, ORGANIC (ICD-607.84) 3. HYPERTENSION (ICD-401.9) 4. CKD 5. DYSLIPIDEMIA (ICD-272.4) 6. Obesity 7. Hyperlipidemia 8. Gout 9. OSA: Severe on 6/13 sleep study.     Family History: No history of cardiomyopathy No history of cancer among first degree relatives father - died of MI (31s)  mother - HTN  Social History: Lives with mother.  Unemployed, formerly a  Film/video editor.  1 sexual partner (male), no contraceptives.  1 adult child. tobacco - none alcohol - none drugs - none  Review of Systems        All systems reviewed and negative except as per HPI.   Current Outpatient Prescriptions  Medication Sig Dispense Refill  . aspirin 81 MG tablet Take 81 mg by mouth daily.      . carvedilol (COREG) 25 MG tablet Take 1 tablet (25 mg total) by mouth 2 (two) times daily with a meal.  180 tablet  3  . enalapril (VASOTEC) 20 MG tablet Take 2 tablets (40 mg total) by mouth daily.  180 tablet  3  . furosemide (LASIX) 40 MG tablet Take 1-2 tablets (40-80 mg total) by mouth 2 (two) times daily. Takes 2 tablets in the morning and 1 tablet in the afternoon  270 tablet  3  . simvastatin (ZOCOR) 40 MG tablet Take 1 tablet (40 mg total) by mouth at bedtime.  90 tablet  3  . spironolactone (ALDACTONE) 25 MG tablet Take 1 tablet (25 mg total) by mouth daily.  90 tablet  3  . DISCONTD: carvedilol (COREG) 25 MG tablet Take 25 mg by mouth 2 (two) times daily with a meal.      . DISCONTD: enalapril (VASOTEC) 20 MG tablet Take 2 tablets (40 mg total) by mouth daily.  60 tablet  11  .  DISCONTD: furosemide (LASIX) 40 MG tablet Take 1-2 tablets (40-80 mg total) by mouth 2 (two) times daily. Takes 2 tablets in the morning and 1 tablet in the afternoon  270 tablet  0  . DISCONTD: simvastatin (ZOCOR) 40 MG tablet Take 1 tablet (40 mg total) by mouth at bedtime.  90 tablet  4  . DISCONTD: spironolactone (ALDACTONE) 25 MG tablet Take 25 mg by mouth daily.      . isosorbide-hydrALAZINE (BIDIL) 20-37.5 MG per tablet 1/2 tablet three times a day  135 tablet  3    BP 126/74  Pulse 95  Ht 5\' 9"  (1.753 m)  Wt 323 lb (146.512 kg)  BMI 47.70 kg/m2  SpO2 99% General: NAD, obese.  Neck:  Thick, JVP 8 cm, no thyromegaly or thyroid nodule.  Lungs: Clear to auscultation bilaterally with normal respiratory effort. CV: Nondisplaced PMI.  Heart regular S1/S2, +S4, no murmur.  1+ edema  1/3 up lower legs bilaterally.  No carotid bruit.   Abdomen: Soft, nontender, no hepatosplenomegaly, no distention.  Neurologic: Alert and oriented x 3.  Psych: Normal affect. Extremities: No clubbing or cyanosis.

## 2012-03-14 NOTE — Assessment & Plan Note (Signed)
BP control reasonable.

## 2012-03-14 NOTE — Assessment & Plan Note (Signed)
Nonischemic cardiomyopathy, possibly due to long-standing poorly-controlled HTN.  Stable NYHA class III symptoms (obesity probably plays a role in his dyspnea as well).  EF 35-40% on recent echo with diffuse hypokinesis.  Wanted to quantify EF more closely by MRI to see if he would be ICD candidate but he could not fit in the magnet.  Volume looks stable.  - Continue current doses of enalapril, Coreg, spironolactone, and Lasix.   - He does not quite qualify for ICD by echo, will repeat echo in 1 year after advancing medical therapy. - Start Bidil 1/2 tab tid. - Followup in the office in 3 months with a BMET at that time.

## 2012-03-14 NOTE — Assessment & Plan Note (Signed)
Severe OSA.  He is working on getting started with CPAP.

## 2012-06-01 ENCOUNTER — Encounter (HOSPITAL_COMMUNITY): Payer: Self-pay | Admitting: Emergency Medicine

## 2012-06-01 ENCOUNTER — Emergency Department (HOSPITAL_COMMUNITY)
Admission: EM | Admit: 2012-06-01 | Discharge: 2012-06-01 | Disposition: A | Discharge status: Home / Self Care | Payer: Self-pay | Attending: Emergency Medicine | Admitting: Emergency Medicine

## 2012-06-01 DIAGNOSIS — E785 Hyperlipidemia, unspecified: Secondary | ICD-10-CM | POA: Insufficient documentation

## 2012-06-01 DIAGNOSIS — M109 Gout, unspecified: Secondary | ICD-10-CM | POA: Insufficient documentation

## 2012-06-01 DIAGNOSIS — Z79899 Other long term (current) drug therapy: Secondary | ICD-10-CM | POA: Insufficient documentation

## 2012-06-01 DIAGNOSIS — F528 Other sexual dysfunction not due to a substance or known physiological condition: Secondary | ICD-10-CM | POA: Insufficient documentation

## 2012-06-01 DIAGNOSIS — I129 Hypertensive chronic kidney disease with stage 1 through stage 4 chronic kidney disease, or unspecified chronic kidney disease: Secondary | ICD-10-CM | POA: Insufficient documentation

## 2012-06-01 DIAGNOSIS — I509 Heart failure, unspecified: Secondary | ICD-10-CM | POA: Insufficient documentation

## 2012-06-01 DIAGNOSIS — Z7982 Long term (current) use of aspirin: Secondary | ICD-10-CM | POA: Insufficient documentation

## 2012-06-01 DIAGNOSIS — E669 Obesity, unspecified: Secondary | ICD-10-CM | POA: Insufficient documentation

## 2012-06-01 DIAGNOSIS — I10 Essential (primary) hypertension: Secondary | ICD-10-CM | POA: Insufficient documentation

## 2012-06-01 MED ORDER — PREDNISONE 20 MG PO TABS: ORAL_TABLET | ORAL | Status: DC

## 2012-06-01 MED ORDER — OXYCODONE-ACETAMINOPHEN 5-325 MG PO TABS: 1.0000 | ORAL_TABLET | Freq: Once | ORAL | Status: DC

## 2012-06-01 MED ORDER — HYDROCODONE-ACETAMINOPHEN 5-325 MG PO TABS: ORAL_TABLET | ORAL | Status: DC

## 2012-06-01 MED ORDER — KETOROLAC TROMETHAMINE 60 MG/2ML IM SOLN
60.0000 mg | Freq: Once | INTRAMUSCULAR | Status: AC
  Administered 2012-06-01: 60 mg via INTRAMUSCULAR
  Filled 2012-06-01: qty 2

## 2012-06-01 NOTE — ED Notes (Signed)
Pain in right big toe x 1 day hx of gout

## 2012-06-03 NOTE — ED Provider Notes (Signed)
History     CSN: [id number]  Arrival date & time 06/01/12  2011   First MD Initiated Contact with Patient 06/01/12 2043      Chief Complaint  Patient presents with  . Toe Pain  . Gout    (Consider location/radiation/quality/duration/timing/severity/associated sxs/prior treatment) HPI Comments: Patient c/o pain, redness and swelling of his right great toe.  Has hx of gout and states the pain feels similar to previous gout.  He denies recent injury or wound to the foot.  He also denies proximal swelling or pain  Patient is a 51 y.o. male presenting with toe pain. The history is provided by the patient.  Toe Pain This is a recurrent problem. The current episode started in the past 7 days. The problem occurs constantly. The problem has been gradually worsening. Associated symptoms include arthralgias and joint swelling. Pertinent negatives include no chills, fever, myalgias, nausea, neck pain, numbness, rash, swollen glands, vomiting or weakness. The symptoms are aggravated by walking and standing. He has tried nothing for the symptoms. The treatment provided no relief.    Past Medical History  Diagnosis Date  . Hypertension   . Hyperlipidemia   . CHF (congestive heart failure)     EF 50-55%    . Erectile dysfunction   . CKD (chronic kidney disease)   . Obesity   . Gout     Past Surgical History  Procedure Date  . Colonoscopy     Family History  Problem Relation Age of Onset  . Hypertension Mother   . Hypertension Father   . Cancer Father     brother died of brain cancer; sister died of bone cancer  . Diabetes Sister   . Diabetes Brother   . Colon cancer Maternal Aunt   . Cardiomyopathy Neg Hx     History  Substance Use Topics  . Smoking status: Never Smoker   . Smokeless tobacco: Never Used  . Alcohol Use: No      Review of Systems  Constitutional: Negative for fever and chills.  HENT: Negative for neck pain.   Gastrointestinal: Negative for nausea and  vomiting.  Genitourinary: Negative for dysuria and difficulty urinating.  Musculoskeletal: Positive for joint swelling and arthralgias. Negative for myalgias.  Skin: Negative for color change, rash and wound.  Neurological: Negative for weakness and numbness.  All other systems reviewed and are negative.    Allergies  Beta adrenergic blockers  Home Medications   Current Outpatient Rx  Name  Route  Sig  Dispense  Refill  . ASPIRIN EC 81 MG PO TBEC   Oral   Take 81 mg by mouth daily.         Marland Kitchen CARVEDILOL 25 MG PO TABS   Oral   Take 1 tablet (25 mg total) by mouth 2 (two) times daily with a meal.   180 tablet   3   . ENALAPRIL MALEATE 20 MG PO TABS   Oral   Take 2 tablets (40 mg total) by mouth daily.   180 tablet   3   . FUROSEMIDE 40 MG PO TABS   Oral   Take 1-2 tablets (40-80 mg total) by mouth 2 (two) times daily. Takes 2 tablets in the morning and 1 tablet in the afternoon   270 tablet   3   . ISOSORB DINITRATE-HYDRALAZINE 20-37.5 MG PO TABS   Oral   Take 1 tablet by mouth 3 (three) times daily. 1/2 tablet three times a day         .  SIMVASTATIN 40 MG PO TABS   Oral   Take 1 tablet (40 mg total) by mouth at bedtime.   90 tablet   3   . SPIRONOLACTONE 25 MG PO TABS   Oral   Take 1 tablet (25 mg total) by mouth daily.   90 tablet   3   . HYDROCODONE-ACETAMINOPHEN 5-325 MG PO TABS      Take one-two tabs po q 4-6 hrs prn pain   20 tablet   0   . PREDNISONE 20 MG PO TABS      Take 3 tabs on days 1 and 2  Take 2 tabs on days 3 and 4  Take 1 tab on days 5 and 6   21 tablet   0     BP 120/80  Pulse 77  Temp 98.4 F (36.9 C) (Oral)  Resp 20  Ht 5' 9.5" (1.765 m)  Wt 323 lb (146.512 kg)  BMI 47.01 kg/m2  SpO2 97%  Physical Exam  Nursing note and vitals reviewed. Constitutional: He is oriented to person, place, and time. He appears well-developed and well-nourished. No distress.  HENT:  Head: Normocephalic and atraumatic.    Cardiovascular: Normal rate, regular rhythm, normal heart sounds and intact distal pulses.   Pulmonary/Chest: Effort normal and breath sounds normal.  Musculoskeletal: He exhibits edema and tenderness.       Right foot: He exhibits tenderness, bony tenderness and swelling. He exhibits normal range of motion, normal capillary refill, no crepitus, no deformity and no laceration.       Feet:       ttp of the MT of the right great toe.  Mild erythema .   ROM is preserved.  DP pulse is brisk, sensation intact.  No abrasion, bruising or deformity.    Neurological: He is alert and oriented to person, place, and time. He exhibits normal muscle tone. Coordination normal.  Skin: Skin is warm and dry.    ED Course  Procedures (including critical care time)  Labs Reviewed - No data to display No results found.   1. Gout flare       MDM    Previous ED charts reviewed by me.  Vitals stable.     No open wounds or lesions to the foot to suggest cellulitis, pt has hx of gout.  Pain feels similar to previous gout flare.  Will treat with steroids, norco.  Pt agrees to f/u with his PMD for recheck if needed.        Denis Carreon L. Syon Tews, [place] 06/03/12 1553

## 2012-06-04 NOTE — ED Provider Notes (Signed)
Medical screening examination/treatment/procedure(s) were performed by non-physician practitioner and as supervising physician I was immediately available for consultation/collaboration.  Saige Canton, MD 06/04/12 1853 

## 2012-06-12 ENCOUNTER — Other Ambulatory Visit: Payer: Self-pay

## 2012-06-12 ENCOUNTER — Ambulatory Visit: Payer: Self-pay | Admitting: Cardiology

## 2012-06-13 ENCOUNTER — Emergency Department (HOSPITAL_COMMUNITY): Payer: Self-pay

## 2012-06-13 ENCOUNTER — Encounter (HOSPITAL_COMMUNITY): Payer: Self-pay | Admitting: Emergency Medicine

## 2012-06-13 ENCOUNTER — Emergency Department (HOSPITAL_COMMUNITY)
Admission: EM | Admit: 2012-06-13 | Discharge: 2012-06-13 | Disposition: A | Discharge status: Home / Self Care | Payer: Self-pay | Attending: Emergency Medicine | Admitting: Emergency Medicine

## 2012-06-13 DIAGNOSIS — E785 Hyperlipidemia, unspecified: Secondary | ICD-10-CM | POA: Insufficient documentation

## 2012-06-13 DIAGNOSIS — N189 Chronic kidney disease, unspecified: Secondary | ICD-10-CM | POA: Insufficient documentation

## 2012-06-13 DIAGNOSIS — M79672 Pain in left foot: Secondary | ICD-10-CM

## 2012-06-13 DIAGNOSIS — Z87448 Personal history of other diseases of urinary system: Secondary | ICD-10-CM | POA: Insufficient documentation

## 2012-06-13 DIAGNOSIS — M109 Gout, unspecified: Secondary | ICD-10-CM | POA: Insufficient documentation

## 2012-06-13 DIAGNOSIS — M25579 Pain in unspecified ankle and joints of unspecified foot: Secondary | ICD-10-CM | POA: Insufficient documentation

## 2012-06-13 DIAGNOSIS — Z79899 Other long term (current) drug therapy: Secondary | ICD-10-CM | POA: Insufficient documentation

## 2012-06-13 DIAGNOSIS — Z7901 Long term (current) use of anticoagulants: Secondary | ICD-10-CM | POA: Insufficient documentation

## 2012-06-13 DIAGNOSIS — I129 Hypertensive chronic kidney disease with stage 1 through stage 4 chronic kidney disease, or unspecified chronic kidney disease: Secondary | ICD-10-CM | POA: Insufficient documentation

## 2012-06-13 DIAGNOSIS — Z7982 Long term (current) use of aspirin: Secondary | ICD-10-CM | POA: Insufficient documentation

## 2012-06-13 DIAGNOSIS — I509 Heart failure, unspecified: Secondary | ICD-10-CM | POA: Insufficient documentation

## 2012-06-13 DIAGNOSIS — E669 Obesity, unspecified: Secondary | ICD-10-CM | POA: Insufficient documentation

## 2012-06-13 MED ORDER — OXYCODONE-ACETAMINOPHEN 5-325 MG PO TABS: 1.0000 | ORAL_TABLET | ORAL | Status: DC | PRN

## 2012-06-13 MED ORDER — OXYCODONE-ACETAMINOPHEN 5-325 MG PO TABS
2.0000 | ORAL_TABLET | Freq: Once | ORAL | Status: AC
  Administered 2012-06-13: 2 via ORAL
  Filled 2012-06-13: qty 2

## 2012-06-13 NOTE — ED Provider Notes (Signed)
History     CSN: [id number]  Arrival date & time 06/13/12  0354   None     Chief Complaint  Patient presents with  . Foot Pain    (Consider location/radiation/quality/duration/timing/severity/associated sxs/prior treatment) HPI Comments: 51 year old male with a history of hypertension, gout, chronic kidney disease who presents with a complaint of left ankle pain. He states this started several days ago, has been persistent, gradually worsening and is located at the insertion of the Achilles tendon on the left heel. He states that it's worse with walking and palpation over that area and is not associated with swelling redness or fevers. He has frequent gouty attacks and review of the medical record shows that he presents to the emergency department frequently for these gout attacks. He was seen 10 days ago for what appears to be gout of his right foot. He states that the symptoms completely resolved with the medications prescribed. He has been using crutches to ambulate over the last day with some improvement.according to the medical record and his last basic metabolic panel his creatinine is approximately 1.5 which is what it has been for some time.  Patient is a 51 y.o. male presenting with lower extremity pain. The history is provided by the patient and medical records.  Foot Pain    Past Medical History  Diagnosis Date  . Hypertension   . Hyperlipidemia   . CHF (congestive heart failure)     EF 50-55%    . Erectile dysfunction   . CKD (chronic kidney disease)   . Obesity   . Gout     Past Surgical History  Procedure Date  . Colonoscopy     Family History  Problem Relation Age of Onset  . Hypertension Mother   . Hypertension Father   . Cancer Father     brother died of brain cancer; sister died of bone cancer  . Diabetes Sister   . Diabetes Brother   . Colon cancer Maternal Aunt   . Cardiomyopathy Neg Hx     History  Substance Use Topics  . Smoking status:  Never Smoker   . Smokeless tobacco: Never Used  . Alcohol Use: No      Review of Systems  Constitutional: Negative for fever and chills.  Musculoskeletal: Negative for joint swelling.  Skin: Negative for rash and wound.    Allergies  Beta adrenergic blockers  Home Medications   Current Outpatient Rx  Name  Route  Sig  Dispense  Refill  . ASPIRIN EC 81 MG PO TBEC   Oral   Take 81 mg by mouth daily.         Marland Kitchen CARVEDILOL 25 MG PO TABS   Oral   Take 1 tablet (25 mg total) by mouth 2 (two) times daily with a meal.   180 tablet   3   . ENALAPRIL MALEATE 20 MG PO TABS   Oral   Take 2 tablets (40 mg total) by mouth daily.   180 tablet   3   . FUROSEMIDE 40 MG PO TABS   Oral   Take 1-2 tablets (40-80 mg total) by mouth 2 (two) times daily. Takes 2 tablets in the morning and 1 tablet in the afternoon   270 tablet   3   . HYDROCODONE-ACETAMINOPHEN 5-325 MG PO TABS      Take one-two tabs po q 4-6 hrs prn pain   20 tablet   0   . ISOSORB DINITRATE-HYDRALAZINE 20-37.5 MG PO  TABS   Oral   Take 1 tablet by mouth 3 (three) times daily. 1/2 tablet three times a day         . PREDNISONE 20 MG PO TABS      Take 3 tabs on days 1 and 2  Take 2 tabs on days 3 and 4  Take 1 tab on days 5 and 6   21 tablet   0   . SIMVASTATIN 40 MG PO TABS   Oral   Take 1 tablet (40 mg total) by mouth at bedtime.   90 tablet   3   . SPIRONOLACTONE 25 MG PO TABS   Oral   Take 1 tablet (25 mg total) by mouth daily.   90 tablet   3   . OXYCODONE-ACETAMINOPHEN 5-325 MG PO TABS   Oral   Take 1 tablet by mouth every 4 (four) hours as needed for pain.   20 tablet   0     BP 152/97  Pulse 83  Temp 98.2 F (36.8 C) (Oral)  Resp 20  Ht 5\' 9"  (1.753 m)  Wt 323 lb (146.512 kg)  BMI 47.70 kg/m2  SpO2 97%  Physical Exam  Nursing note and vitals reviewed. Constitutional: He appears well-developed and well-nourished. No distress.  HENT:  Head: Normocephalic and atraumatic.    Eyes: Conjunctivae normal are normal. No scleral icterus.  Musculoskeletal:       Strength at the left heel is normal, he has mild pain with plantar flexion of the foot. There is tenderness to palpation at the insertion of the Achilles on the calcaneus. There is no pain over the malleoli, no pain over the base of the fifth or the midfoot. There is no pain at the base of the great toe of the left foot  Neurological: He is alert. Coordination normal.       Normal sensation and motor of the left lower extremity  Skin: Skin is warm and dry. No rash noted. No erythema.    ED Course  Procedures (including critical care time)  Labs Reviewed - No data to display Dg Foot Complete Left  06/13/2012  *RADIOLOGY REPORT*  Clinical Data: Left heel pain at the Achilles tendon insertion.  LEFT FOOT - COMPLETE 3+ VIEW  Comparison: 08/16/2011 contralateral ankle, 12/11/2009 left ankle radiographs  Findings: Enthesopathic change at the plantar and posterior calcaneal surface.  Mild midfoot DJD.  Intact Lisfranc joint. Hyperflexion at the second DIP joint may be positional.  Small osseous fragment along the dorsal surface of the anterior talus is again noted and may reflect sequelae of prior injury.  IMPRESSION: Enthesopathic changes at the Achilles tendon insertion.  No acute fracture identified.   Original Report Authenticated By: Jearld Lesch, M.D.      1. Pain of left heel       MDM  The patient's exam shows that he is tenderness over the insertion of the Achilles tendon on the calcaneus. This is not a joint, this is not the usual presentation of gout, the patient has been on prednisone frequently in the past for his gout thus he is at risk for weekend tendons. I have ordered an x-ray of the foot to rule out and able to fracture of his calcaneus though it could be a partial evulsion of the tendon as well. He does have the ability to plantar and dorsiflex the foot suggesting that he does not have a plate  rupture of any tendons. He is obese  and has swelling of both ankles thus there does not appear to be any asymmetry. There is no redness warmth or fevers to suggest infection.  Nursing notes were considered, vital signs are reviewed, x-rays have been interpreted by myself showing some changes of the bone at the insertion site of the Achilles tendon. There is not appear to be an evulsion fracture and clinically he has an intact Achilles tendon. These findings have been explained to the patient, pain medication given, rice therapy recommended, encourage followup with family doctor.     Vida Roller, MD 06/13/12 [phone]

## 2012-06-13 NOTE — ED Notes (Signed)
Discharge instructions reviewed with pt, questions answered. Pt verbalized understanding.  

## 2012-06-13 NOTE — ED Notes (Signed)
Patient c/o left foot pain x "a few days"; states hurts behind his left ankle.  States has history of gout.

## 2012-07-25 ENCOUNTER — Other Ambulatory Visit (INDEPENDENT_AMBULATORY_CARE_PROVIDER_SITE_OTHER): Payer: No Typology Code available for payment source

## 2012-07-25 ENCOUNTER — Encounter: Payer: Self-pay | Admitting: Cardiology

## 2012-07-25 ENCOUNTER — Ambulatory Visit (INDEPENDENT_AMBULATORY_CARE_PROVIDER_SITE_OTHER): Payer: Self-pay | Admitting: Cardiology

## 2012-07-25 VITALS — BP 130/78 | HR 96 | Ht 69.0 in | Wt 329.0 lb

## 2012-07-25 DIAGNOSIS — I5023 Acute on chronic systolic (congestive) heart failure: Secondary | ICD-10-CM

## 2012-07-25 DIAGNOSIS — I10 Essential (primary) hypertension: Secondary | ICD-10-CM

## 2012-07-25 DIAGNOSIS — N259 Disorder resulting from impaired renal tubular function, unspecified: Secondary | ICD-10-CM

## 2012-07-25 DIAGNOSIS — I5020 Unspecified systolic (congestive) heart failure: Secondary | ICD-10-CM

## 2012-07-25 DIAGNOSIS — G4733 Obstructive sleep apnea (adult) (pediatric): Secondary | ICD-10-CM

## 2012-07-25 DIAGNOSIS — R0989 Other specified symptoms and signs involving the circulatory and respiratory systems: Secondary | ICD-10-CM

## 2012-07-25 DIAGNOSIS — I2510 Atherosclerotic heart disease of native coronary artery without angina pectoris: Secondary | ICD-10-CM

## 2012-07-25 LAB — LIPID PANEL
HDL: 29.4 mg/dL — ABNORMAL LOW (ref >39.00)
LDL Cholesterol: 73 mg/dL (ref 0–99)
Total CHOL/HDL Ratio: 4
Triglycerides: 121 mg/dL (ref 0.0–149.0)
VLDL: 24.2 mg/dL (ref 0.0–40.0)

## 2012-07-25 LAB — BASIC METABOLIC PANEL
BUN: 18 mg/dL (ref 6–23)
CO2: 30 mEq/L (ref 19–32)
Chloride: 102 mEq/L (ref 96–112)
Glucose, Bld: 109 mg/dL — ABNORMAL HIGH (ref 70–99)
Potassium: 3.9 mEq/L (ref 3.5–5.1)
Sodium: 140 mEq/L (ref 135–145)

## 2012-07-25 MED ORDER — HYDRALAZINE HCL 25 MG PO TABS: 25.0000 mg | ORAL_TABLET | Freq: Three times a day (TID) | ORAL | Status: DC

## 2012-07-25 MED ORDER — ISOSORBIDE MONONITRATE ER 30 MG PO TB24: 30.0000 mg | ORAL_TABLET | Freq: Every day | ORAL | Status: DC

## 2012-07-25 NOTE — Progress Notes (Signed)
Patient ID: Eric Murillo, male   DOB: May 06, 1961, 52 y.o.   MRN: [id number] PCP: Dr. Dierdre Searles  52 yo with history of obesity, hyperlipidemia, HTN, and nonischemic cardiomyopathy with CHF returns for cardiology followup.  He says he has been doing fairly well.  Last echo in 6/13 showed EF 35-40%.  I tried to set him up for a cardiac MRI to more closely quantify EF to see if he was in ICD range, but he did not fit in the magnet.  He was diagnosed with severe OSA but has been unable to tolerate CPAP.  He did not start Bidil after last appointment because it was too expensive.   Weight is up 6 lbs.  He does not get short of breath walking on flat ground but does feel like he is holding onto fluid.  He thinks this began after Christmas.  He has noted increased lower extremity edema.  No chest pain.  No orthopnea or PND.  He does ride a stationary bike for 30 minutes several times a week.   Labs (3/12): K 4.1, creatinine 1.5, LDL 91, HDL 29, LFTs normal, BNP 260, HCT 43 Labs (6/12): K 4, creatinine 1.4, HIV negative, TSH normal, ANA normal, SPEP negative Labs (4/13): LDL 75, HDL 30, K 4.2, creatinine 4.09 Labs (7/13): BNP 356, K 3.9, creatinine 1.5  Past Medical History: 1. Nonischemic cardiomyopathy: Echo (3/12) with EF 30-35% and mildly dilated LV, diffuse LV hypokinesis, moderate MR, PA systolic pressure 55 mmHg.  Left and right heart cath (3/12): No angiographic CAD; mean RA 20, PA 76/41, mean PCWP 38, CI 2.1.  ANA, SPEP, and HIV negative.  TSH normal.  Denies drug abuse, heavy ETOH intake.  No family history of cardiomyopathy.  Cardiomyopathy may be due to long-standing HTN.  Echo (6/13): EF 35-40%, moderate LV dilation, moderate to severe LAE, PA systolic pressure 52 mmHg.  Unable to fit in magnet for cardiac MRI.  2. ERECTILE DYSFUNCTION, ORGANIC (ICD-607.84) 3. HYPERTENSION (ICD-401.9) 4. CKD 5. DYSLIPIDEMIA (ICD-272.4) 6. Obesity 7. Hyperlipidemia 8. Gout 9. OSA: Severe on 6/13 sleep study.      Family History: No history of cardiomyopathy No history of cancer among first degree relatives father - died of MI (36s)  mother - HTN  Social History: Lives with mother.  Unemployed, formerly a Film/video editor.  1 sexual partner (male), no contraceptives.  1 adult child. tobacco - none alcohol - none drugs - none  Review of Systems        All systems reviewed and negative except as per HPI.   Current Outpatient Prescriptions  Medication Sig Dispense Refill  . aspirin EC 81 MG tablet Take 81 mg by mouth daily.      . carvedilol (COREG) 25 MG tablet Take 1 tablet (25 mg total) by mouth 2 (two) times daily with a meal.  180 tablet  3  . enalapril (VASOTEC) 20 MG tablet Take 2 tablets (40 mg total) by mouth daily.  180 tablet  3  . furosemide (LASIX) 40 MG tablet Take 1-2 tablets (40-80 mg total) by mouth 2 (two) times daily. Takes 2 tablets in the morning and 1 tablet in the afternoon  270 tablet  3  . oxyCODONE-acetaminophen (PERCOCET) 5-325 MG per tablet Take 1 tablet by mouth every 4 (four) hours as needed for pain.  20 tablet  0  . simvastatin (ZOCOR) 40 MG tablet Take 1 tablet (40 mg total) by mouth at bedtime.  90 tablet  3  . spironolactone (  ALDACTONE) 25 MG tablet Take 1 tablet (25 mg total) by mouth daily.  90 tablet  3  . hydrALAZINE (APRESOLINE) 25 MG tablet Take 1 tablet (25 mg total) by mouth 3 (three) times daily.  90 tablet  3  . isosorbide mononitrate (IMDUR) 30 MG 24 hr tablet Take 1 tablet (30 mg total) by mouth daily.  30 tablet  3    BP 130/78  Pulse 96  Ht 5\' 9"  (1.753 m)  Wt 329 lb (149.233 kg)  BMI 48.58 kg/m2 General: NAD, obese.  Neck:  Thick, JVP 8-9 cm, no thyromegaly or thyroid nodule.  Lungs: Clear to auscultation bilaterally with normal respiratory effort. CV: Nondisplaced PMI.  Heart regular S1/S2, +S4, no murmur.  1+ edema 1/3 up lower legs bilaterally.  No carotid bruit.   Abdomen: Soft, nontender, no hepatosplenomegaly, no distention.   Neurologic: Alert and oriented x 3.  Psych: Normal affect. Extremities: No clubbing or cyanosis.   Assessment/Plan:  Systolic heart failure  Nonischemic cardiomyopathy, possibly due to long-standing poorly-controlled HTN. Stable NYHA class III symptoms but weight is increased and he looks more volume overloaded today.  I suspect this may be related to dietary sodium indiscretion over Christmas.  EF 35-40% on recent echo with diffuse hypokinesis. Wanted to quantify EF more closely by MRI to see if he would be ICD candidate but he could not fit in the magnet.  - Continue current doses of enalapril, Coreg, and spironolactone. - He was unable to afford Bidil so will have him take hydralazine 25 mg tid and Imdur 30 mg daily instead.  - I will increase Lasix to 80 mg po bid x 1 week.  After one week, he will go back to Lasix 80 qam, 40 qpm.  I asked him to cut back on dietary sodium.  - He does not quite qualify for ICD by echo, will repeat echo in 8/14 after advancing medical therapy.  - BMET/BNP today.  - Followup in 1 month.  RENAL INSUFFICIENCY Check BMET today.  HYPERTENSION  BP control reasonable.  OSA (obstructive sleep apnea)  Severe OSA. He has not tolerated CPAP.   [name] 07/25/2012 4:00 PM

## 2012-07-25 NOTE — Patient Instructions (Addendum)
Increase lasix(furosemide) to 80mg  two times a day for 1 week, then decrease back to your current dose of 80mg  in the AM and 40mg  in the PM.  Start hydralazine 25mg  three times a day.   Start imdur(isosorbide) 30mg  daily.  Your physician recommends that you have a lipid profile /BMET/BNP today.  Your physician recommends that you schedule a follow-up appointment in: 1 month with Dr Shirlee Latch.

## 2012-09-06 ENCOUNTER — Encounter (HOSPITAL_COMMUNITY): Payer: Self-pay | Admitting: Emergency Medicine

## 2012-09-06 ENCOUNTER — Emergency Department (HOSPITAL_COMMUNITY)
Admission: EM | Admit: 2012-09-06 | Discharge: 2012-09-06 | Disposition: A | Discharge status: Home / Self Care | Payer: Self-pay | Attending: Emergency Medicine | Admitting: Emergency Medicine

## 2012-09-06 DIAGNOSIS — Z792 Long term (current) use of antibiotics: Secondary | ICD-10-CM | POA: Insufficient documentation

## 2012-09-06 DIAGNOSIS — K111 Hypertrophy of salivary gland: Secondary | ICD-10-CM | POA: Insufficient documentation

## 2012-09-06 DIAGNOSIS — I509 Heart failure, unspecified: Secondary | ICD-10-CM | POA: Insufficient documentation

## 2012-09-06 DIAGNOSIS — Z7982 Long term (current) use of aspirin: Secondary | ICD-10-CM | POA: Insufficient documentation

## 2012-09-06 DIAGNOSIS — Z862 Personal history of diseases of the blood and blood-forming organs and certain disorders involving the immune mechanism: Secondary | ICD-10-CM | POA: Insufficient documentation

## 2012-09-06 DIAGNOSIS — Z79899 Other long term (current) drug therapy: Secondary | ICD-10-CM | POA: Insufficient documentation

## 2012-09-06 DIAGNOSIS — R1310 Dysphagia, unspecified: Secondary | ICD-10-CM | POA: Insufficient documentation

## 2012-09-06 DIAGNOSIS — E785 Hyperlipidemia, unspecified: Secondary | ICD-10-CM | POA: Insufficient documentation

## 2012-09-06 DIAGNOSIS — N189 Chronic kidney disease, unspecified: Secondary | ICD-10-CM | POA: Insufficient documentation

## 2012-09-06 DIAGNOSIS — Z87448 Personal history of other diseases of urinary system: Secondary | ICD-10-CM | POA: Insufficient documentation

## 2012-09-06 DIAGNOSIS — I889 Nonspecific lymphadenitis, unspecified: Secondary | ICD-10-CM | POA: Insufficient documentation

## 2012-09-06 DIAGNOSIS — E669 Obesity, unspecified: Secondary | ICD-10-CM | POA: Insufficient documentation

## 2012-09-06 DIAGNOSIS — Z8639 Personal history of other endocrine, nutritional and metabolic disease: Secondary | ICD-10-CM | POA: Insufficient documentation

## 2012-09-06 DIAGNOSIS — I129 Hypertensive chronic kidney disease with stage 1 through stage 4 chronic kidney disease, or unspecified chronic kidney disease: Secondary | ICD-10-CM | POA: Insufficient documentation

## 2012-09-06 MED ORDER — OXYCODONE-ACETAMINOPHEN 5-325 MG PO TABS: 2.0000 | ORAL_TABLET | ORAL | Status: DC | PRN

## 2012-09-06 MED ORDER — PENICILLIN V POTASSIUM 500 MG PO TABS: 1000.0000 mg | ORAL_TABLET | Freq: Two times a day (BID) | ORAL | Status: DC

## 2012-09-06 NOTE — ED Notes (Signed)
Patient with no complaints at this time. Respirations even and unlabored. Skin warm/dry. Discharge instructions reviewed with patient at this time. Patient given opportunity to voice concerns/ask questions.  Reviewed MYChart w/patient.  Patient discharged at this time and left Emergency Department with steady gait.

## 2012-09-06 NOTE — ED Provider Notes (Signed)
History     CSN: [id number]  Arrival date & time 09/06/12  0913   First MD Initiated Contact with Patient 09/06/12 [phone]      Chief Complaint  Patient presents with  . Sore Throat    (Consider location/radiation/quality/duration/timing/severity/associated sxs/prior treatment) HPI This 52 year old male has a few days of pain under the right side of his tongue with a tender right submandibular lymph node worse with chewing and swallowing but no stridor drooling or voice change. He is no fever. He is no chest pain cough or shortness of breath. He is no abdominal pain or vomiting. He is no rash. He has no trismus. He is no neck swelling. His teeth themselves do not hurt currently. His pain is moderately severe. There is no treatment prior to arrival. His tongue itself is not swollen. Past Medical History  Diagnosis Date  . Hypertension   . Hyperlipidemia   . CHF (congestive heart failure)     EF 50-55%    . Erectile dysfunction   . CKD (chronic kidney disease)   . Obesity   . Gout     Past Surgical History  Procedure Laterality Date  . Colonoscopy    . Salivary gland surgery  09/12/2012  . Cardiac catheterization  2012  . Submandibular gland excision Right 09/12/2012    Procedure: Removal Right Submandibular Larina Bras;  Surgeon: Serena Colonel, MD;  Location: [facility] OR;  Service: ENT;  Laterality: Right;    Family History  Problem Relation Age of Onset  . Hypertension Mother   . Hypertension Father   . Cancer Father     brother died of brain cancer; sister died of bone cancer  . Diabetes Sister   . Diabetes Brother   . Colon cancer Maternal Aunt   . Cardiomyopathy Neg Hx     History  Substance Use Topics  . Smoking status: Never Smoker   . Smokeless tobacco: Never Used  . Alcohol Use: No      Review of Systems 10 Systems reviewed and are negative for acute change except as noted in the HPI. Allergies  Beta adrenergic blockers  Home Medications   Current Outpatient Rx   Name  Route  Sig  Dispense  Refill  . aspirin EC 81 MG tablet   Oral   Take 81 mg by mouth daily.         . carvedilol (COREG) 25 MG tablet   Oral   Take 1 tablet (25 mg total) by mouth 2 (two) times daily with a meal.   180 tablet   3   . enalapril (VASOTEC) 20 MG tablet   Oral   Take 2 tablets (40 mg total) by mouth daily.   180 tablet   3   . hydrALAZINE (APRESOLINE) 25 MG tablet   Oral   Take 1 tablet (25 mg total) by mouth 3 (three) times daily.   90 tablet   3   . isosorbide mononitrate (IMDUR) 30 MG 24 hr tablet   Oral   Take 1 tablet (30 mg total) by mouth daily.   30 tablet   3   . simvastatin (ZOCOR) 40 MG tablet   Oral   Take 1 tablet (40 mg total) by mouth at bedtime.   90 tablet   3   . spironolactone (ALDACTONE) 25 MG tablet   Oral   Take 1 tablet (25 mg total) by mouth daily.   90 tablet   3   . amoxicillin (AMOXIL) 500  MG capsule   Oral   Take 1 capsule (500 mg total) by mouth 3 (three) times daily.   21 capsule   0   . furosemide (LASIX) 40 MG tablet   Oral   Take 40-80 mg by mouth daily. Takes 80mg  every morning and takes 40mg  every evening.         Marland Kitchen HYDROcodone-acetaminophen (NORCO) 7.5-325 MG per tablet   Oral   Take 1 tablet by mouth every 6 (six) hours as needed for pain.   30 tablet   0   . oxyCODONE-acetaminophen (PERCOCET) 5-325 MG per tablet   Oral   Take 2 tablets by mouth every 4 (four) hours as needed for pain.   20 tablet   0   . promethazine (PHENERGAN) 25 MG suppository   Rectal   Place 1 suppository (25 mg total) rectally every 6 (six) hours as needed for nausea.   12 each   0     Pulse 81  Temp(Src) 98.6 F (37 C) (Oral)  Resp 18  Ht 5' 9.5" (1.765 m)  Wt 329 lb (149.233 kg)  BMI 47.9 kg/m2  SpO2 96%  Physical Exam  Nursing note and vitals reviewed. Constitutional:  Awake, alert, nontoxic appearance.  HENT:  Head: Atraumatic.  Mouth/Throat: Oropharynx is clear and moist. No oropharyngeal  exudate.  His teeth are nontender, his airways patent and maintained with normal voice, there is no stridor drooling or trismus, his tongue is normal however he is tender in the right sublingual area without swelling or deformity noted, he does have a tender right submandibular lymph node on his external examination no cervical lymphadenopathy no neck swelling and no tenderness the rest of his neck.  Eyes: Right eye exhibits no discharge. Left eye exhibits no discharge.  Neck: Neck supple.  Cardiovascular: Normal rate and regular rhythm.   No murmur heard. Pulmonary/Chest: Effort normal and breath sounds normal. No respiratory distress. He has no wheezes. He has no rales. He exhibits no tenderness.  Abdominal: Soft. There is no tenderness. There is no rebound.  Musculoskeletal: He exhibits no tenderness.  Baseline ROM, no obvious new focal weakness.  Lymphadenopathy:    He has no cervical adenopathy.  Neurological:  Mental status and motor strength appears baseline for patient and situation.  Skin: No rash noted.  Psychiatric: He has a normal mood and affect.    ED Course  Procedures (including critical care time)  in case he has some sort of odontalgia infection we will start antibiotics but I do not think he needs CT scan imaging today I doubt deep space neck infection or airway compromise. I believe he is stable for outpatient followup he agrees.  Labs Reviewed - No data to display No results found.   1. Submandibular lymphadenitis       MDM  Patient / Family / Caregiver informed of clinical course, understand medical decision-making process, and agree with plan.        Hurman Horn, MD 09/16/12 [phone]

## 2012-09-06 NOTE — ED Notes (Signed)
Sore throat. Hurts worse on right side.

## 2012-09-08 ENCOUNTER — Emergency Department (HOSPITAL_COMMUNITY)
Admission: EM | Admit: 2012-09-08 | Discharge: 2012-09-09 | Disposition: A | Discharge status: Home / Self Care | Payer: Self-pay | Attending: Emergency Medicine | Admitting: Emergency Medicine

## 2012-09-08 DIAGNOSIS — E785 Hyperlipidemia, unspecified: Secondary | ICD-10-CM | POA: Insufficient documentation

## 2012-09-08 DIAGNOSIS — Z79899 Other long term (current) drug therapy: Secondary | ICD-10-CM | POA: Insufficient documentation

## 2012-09-08 DIAGNOSIS — Z7982 Long term (current) use of aspirin: Secondary | ICD-10-CM | POA: Insufficient documentation

## 2012-09-08 DIAGNOSIS — Z8639 Personal history of other endocrine, nutritional and metabolic disease: Secondary | ICD-10-CM | POA: Insufficient documentation

## 2012-09-08 DIAGNOSIS — R599 Enlarged lymph nodes, unspecified: Secondary | ICD-10-CM | POA: Insufficient documentation

## 2012-09-08 DIAGNOSIS — Z862 Personal history of diseases of the blood and blood-forming organs and certain disorders involving the immune mechanism: Secondary | ICD-10-CM | POA: Insufficient documentation

## 2012-09-08 DIAGNOSIS — Z87448 Personal history of other diseases of urinary system: Secondary | ICD-10-CM | POA: Insufficient documentation

## 2012-09-08 DIAGNOSIS — R609 Edema, unspecified: Secondary | ICD-10-CM

## 2012-09-08 DIAGNOSIS — I509 Heart failure, unspecified: Secondary | ICD-10-CM | POA: Insufficient documentation

## 2012-09-08 DIAGNOSIS — E669 Obesity, unspecified: Secondary | ICD-10-CM | POA: Insufficient documentation

## 2012-09-08 DIAGNOSIS — I129 Hypertensive chronic kidney disease with stage 1 through stage 4 chronic kidney disease, or unspecified chronic kidney disease: Secondary | ICD-10-CM | POA: Insufficient documentation

## 2012-09-08 DIAGNOSIS — N189 Chronic kidney disease, unspecified: Secondary | ICD-10-CM | POA: Insufficient documentation

## 2012-09-09 ENCOUNTER — Encounter (HOSPITAL_COMMUNITY): Payer: Self-pay | Admitting: *Deleted

## 2012-09-09 MED ORDER — DIPHENHYDRAMINE HCL 25 MG PO CAPS
25.0000 mg | ORAL_CAPSULE | Freq: Once | ORAL | Status: AC
  Administered 2012-09-09: 25 mg via ORAL
  Filled 2012-09-09: qty 1

## 2012-09-09 MED ORDER — IBUPROFEN 800 MG PO TABS
800.0000 mg | ORAL_TABLET | Freq: Once | ORAL | Status: AC
  Administered 2012-09-09: 800 mg via ORAL
  Filled 2012-09-09: qty 1

## 2012-09-09 NOTE — ED Notes (Signed)
Pt c/o swelling to throat. Pt states he was seen and treated this past Friday and has had no improvement. Pt states he started taking the antibiotics on Friday. Pt states it feels like it is getting worse.

## 2012-09-09 NOTE — ED Provider Notes (Signed)
History     CSN: [id number]  Arrival date & time 09/08/12  2352   First MD Initiated Contact with Patient 09/09/12 0000      Chief Complaint  Patient presents with  . throat swelling     (Consider location/radiation/quality/duration/timing/severity/associated sxs/prior treatment) HPI Eric Murillo is a 52 y.o. male who presents to the Emergency Department complaining of "swollen glands" that have been present since Friday. He has been on Penicillin and percocet. Continued swelling and soreness. Denies fever, chills, difficulty swallowing.  Past Medical History  Diagnosis Date  . Hypertension   . Hyperlipidemia   . CHF (congestive heart failure)     EF 50-55%    . Erectile dysfunction   . CKD (chronic kidney disease)   . Obesity   . Gout     Past Surgical History  Procedure Laterality Date  . Colonoscopy      Family History  Problem Relation Age of Onset  . Hypertension Mother   . Hypertension Father   . Cancer Father     brother died of brain cancer; sister died of bone cancer  . Diabetes Sister   . Diabetes Brother   . Colon cancer Maternal Aunt   . Cardiomyopathy Neg Hx     History  Substance Use Topics  . Smoking status: Never Smoker   . Smokeless tobacco: Never Used  . Alcohol Use: No      Review of Systems  Constitutional: Negative for fever.       10 Systems reviewed and are negative for acute change except as noted in the HPI.  HENT: Negative for congestion.        Tongue midline. Submandibular gland on right is swollen  Eyes: Negative for discharge and redness.  Respiratory: Negative for cough and shortness of breath.   Cardiovascular: Negative for chest pain.  Gastrointestinal: Negative for vomiting and abdominal pain.  Musculoskeletal: Negative for back pain.  Skin: Negative for rash.  Neurological: Negative for syncope, numbness and headaches.  Psychiatric/Behavioral:       No behavior change.    Allergies  Beta adrenergic  blockers  Home Medications   Current Outpatient Rx  Name  Route  Sig  Dispense  Refill  . aspirin EC 81 MG tablet   Oral   Take 81 mg by mouth daily.         . carvedilol (COREG) 25 MG tablet   Oral   Take 1 tablet (25 mg total) by mouth 2 (two) times daily with a meal.   180 tablet   3   . enalapril (VASOTEC) 20 MG tablet   Oral   Take 2 tablets (40 mg total) by mouth daily.   180 tablet   3   . furosemide (LASIX) 40 MG tablet   Oral   Take 1-2 tablets (40-80 mg total) by mouth 2 (two) times daily. Takes 2 tablets in the morning and 1 tablet in the afternoon   270 tablet   3   . hydrALAZINE (APRESOLINE) 25 MG tablet   Oral   Take 1 tablet (25 mg total) by mouth 3 (three) times daily.   90 tablet   3   . isosorbide mononitrate (IMDUR) 30 MG 24 hr tablet   Oral   Take 1 tablet (30 mg total) by mouth daily.   30 tablet   3   . oxyCODONE-acetaminophen (PERCOCET) 5-325 MG per tablet   Oral   Take 2 tablets by mouth every 4 (four)  hours as needed for pain.   20 tablet   0   . penicillin v potassium (VEETID) 500 MG tablet   Oral   Take 2 tablets (1,000 mg total) by mouth 2 (two) times daily. X 7 days   28 tablet   0   . simvastatin (ZOCOR) 40 MG tablet   Oral   Take 1 tablet (40 mg total) by mouth at bedtime.   90 tablet   3   . spironolactone (ALDACTONE) 25 MG tablet   Oral   Take 1 tablet (25 mg total) by mouth daily.   90 tablet   3     BP 139/80  Pulse 108  Temp(Src) 101.1 F (38.4 C) (Oral)  Resp 20  Ht 5\' 9"  (1.753 m)  Wt 321 lb (145.605 kg)  BMI 47.38 kg/m2  SpO2 95%  Physical Exam  Nursing note and vitals reviewed. Constitutional:  Awake, alert, nontoxic appearance.  HENT:  Head: Atraumatic.  Mouth/Throat: Oropharynx is clear and moist.  Right submandibular gland swollen and sore  Eyes: Right eye exhibits no discharge. Left eye exhibits no discharge.  Neck: Neck supple.  Cardiovascular: Normal rate.   Pulmonary/Chest: Effort  normal and breath sounds normal. He exhibits no tenderness.  Abdominal: Soft. Bowel sounds are normal. There is no tenderness. There is no rebound.  Musculoskeletal: He exhibits no tenderness.  Baseline ROM, no obvious new focal weakness.  Neurological:  Mental status and motor strength appears baseline for patient and situation.  Skin: No rash noted.  Psychiatric: He has a normal mood and affect.    ED Course  Procedures (including critical care time)     MDM  Patient with continued submandibular swelling and soreness despite pencillin and percocet. Advised continued gargles, ibuprofen, benadryl. Pt stable in ED with no significant deterioration in condition.The patient appears reasonably screened and/or stabilized for discharge and I doubt any other medical condition or other [facility] requiring further screening, evaluation, or treatment in the ED at this time prior to discharge. MDM Reviewed: nursing note and vitals           Nicoletta Dress. Colon Branch, MD 09/09/12 1308

## 2012-09-12 ENCOUNTER — Encounter (HOSPITAL_COMMUNITY): Payer: Self-pay | Admitting: *Deleted

## 2012-09-12 ENCOUNTER — Encounter (HOSPITAL_COMMUNITY)
Admission: EM | Disposition: A | Discharge status: Home / Self Care | Payer: Self-pay | Source: Home / Self Care | Attending: Emergency Medicine

## 2012-09-12 ENCOUNTER — Observation Stay (HOSPITAL_COMMUNITY)
Admission: EM | Admit: 2012-09-12 | Discharge: 2012-09-13 | Disposition: A | Discharge status: Home / Self Care | Payer: Self-pay | Attending: Otolaryngology | Admitting: Otolaryngology

## 2012-09-12 ENCOUNTER — Encounter (HOSPITAL_COMMUNITY): Payer: Self-pay | Admitting: Cardiology

## 2012-09-12 ENCOUNTER — Emergency Department (HOSPITAL_COMMUNITY): Payer: Self-pay

## 2012-09-12 ENCOUNTER — Emergency Department (HOSPITAL_COMMUNITY): Payer: Self-pay | Admitting: *Deleted

## 2012-09-12 DIAGNOSIS — K115 Sialolithiasis: Principal | ICD-10-CM | POA: Insufficient documentation

## 2012-09-12 DIAGNOSIS — E785 Hyperlipidemia, unspecified: Secondary | ICD-10-CM | POA: Insufficient documentation

## 2012-09-12 DIAGNOSIS — E669 Obesity, unspecified: Secondary | ICD-10-CM | POA: Insufficient documentation

## 2012-09-12 DIAGNOSIS — N189 Chronic kidney disease, unspecified: Secondary | ICD-10-CM | POA: Insufficient documentation

## 2012-09-12 DIAGNOSIS — I129 Hypertensive chronic kidney disease with stage 1 through stage 4 chronic kidney disease, or unspecified chronic kidney disease: Secondary | ICD-10-CM | POA: Insufficient documentation

## 2012-09-12 HISTORY — PX: SALIVARY GLAND SURGERY: SHX768

## 2012-09-12 HISTORY — PX: SUBMANDIBULAR GLAND EXCISION: SHX2456

## 2012-09-12 LAB — POCT I-STAT, CHEM 8
Chloride: 105 mEq/L (ref 96–112)
Creatinine, Ser: 1.5 mg/dL — ABNORMAL HIGH (ref 0.50–1.35)
Glucose, Bld: 97 mg/dL (ref 70–99)
Potassium: 4.1 mEq/L (ref 3.5–5.1)

## 2012-09-12 SURGERY — EXCISION, SUBMANDIBULAR GLAND
Anesthesia: General | Site: Mouth | Laterality: Right | Wound class: Dirty or Infected

## 2012-09-12 MED ORDER — AMOXICILLIN 500 MG PO CAPS
500.0000 mg | ORAL_CAPSULE | Freq: Three times a day (TID) | ORAL | Status: DC
  Administered 2012-09-13: 500 mg via ORAL
  Filled 2012-09-12 (×6): qty 1

## 2012-09-12 MED ORDER — CARVEDILOL 25 MG PO TABS
25.0000 mg | ORAL_TABLET | Freq: Two times a day (BID) | ORAL | Status: DC
  Administered 2012-09-13: 25 mg via ORAL
  Filled 2012-09-12 (×3): qty 1

## 2012-09-12 MED ORDER — OXYCODONE HCL 5 MG PO TABS: 5.0000 mg | ORAL_TABLET | Freq: Once | ORAL | Status: DC | PRN

## 2012-09-12 MED ORDER — IOHEXOL 300 MG/ML  SOLN
75.0000 mL | Freq: Once | INTRAMUSCULAR | Status: AC | PRN
  Administered 2012-09-12: 75 mL via INTRAVENOUS

## 2012-09-12 MED ORDER — ONDANSETRON HCL 4 MG/2ML IJ SOLN: 4.0000 mg | Freq: Once | INTRAMUSCULAR | Status: DC | PRN

## 2012-09-12 MED ORDER — FUROSEMIDE 80 MG PO TABS
80.0000 mg | ORAL_TABLET | Freq: Every day | ORAL | Status: DC
  Administered 2012-09-13: 80 mg via ORAL
  Filled 2012-09-12 (×2): qty 1

## 2012-09-12 MED ORDER — HYDROCODONE-ACETAMINOPHEN 5-325 MG PO TABS: 1.0000 | ORAL_TABLET | ORAL | Status: DC | PRN

## 2012-09-12 MED ORDER — FUROSEMIDE 40 MG PO TABS
40.0000 mg | ORAL_TABLET | Freq: Every day | ORAL | Status: DC
  Filled 2012-09-12: qty 1

## 2012-09-12 MED ORDER — MIDAZOLAM HCL 5 MG/5ML IJ SOLN
INTRAMUSCULAR | Status: DC | PRN
  Administered 2012-09-12: 2 mg via INTRAVENOUS

## 2012-09-12 MED ORDER — MORPHINE BOLUS VIA INFUSION: 2.0000 mg | INTRAVENOUS | Status: DC | PRN

## 2012-09-12 MED ORDER — ETOMIDATE 2 MG/ML IV SOLN
INTRAVENOUS | Status: DC | PRN
  Administered 2012-09-12: 16 mg via INTRAVENOUS

## 2012-09-12 MED ORDER — MEPERIDINE HCL 25 MG/ML IJ SOLN: 6.2500 mg | INTRAMUSCULAR | Status: DC | PRN

## 2012-09-12 MED ORDER — IBUPROFEN 100 MG/5ML PO SUSP
400.0000 mg | Freq: Four times a day (QID) | ORAL | Status: DC | PRN
  Filled 2012-09-12: qty 20

## 2012-09-12 MED ORDER — FUROSEMIDE 40 MG PO TABS: 40.0000 mg | ORAL_TABLET | Freq: Every day | ORAL | Status: DC

## 2012-09-12 MED ORDER — ONDANSETRON HCL 4 MG/2ML IJ SOLN
4.0000 mg | Freq: Once | INTRAMUSCULAR | Status: AC
  Administered 2012-09-12: 4 mg via INTRAVENOUS
  Filled 2012-09-12: qty 2

## 2012-09-12 MED ORDER — MORPHINE SULFATE 4 MG/ML IJ SOLN
4.0000 mg | Freq: Once | INTRAMUSCULAR | Status: AC
  Administered 2012-09-12: 4 mg via INTRAVENOUS
  Filled 2012-09-12: qty 1

## 2012-09-12 MED ORDER — CLINDAMYCIN PHOSPHATE 600 MG/50ML IV SOLN
600.0000 mg | Freq: Once | INTRAVENOUS | Status: AC
  Administered 2012-09-12: 600 mg via INTRAVENOUS
  Filled 2012-09-12: qty 50

## 2012-09-12 MED ORDER — PROMETHAZINE HCL 25 MG RE SUPP: 25.0000 mg | Freq: Four times a day (QID) | RECTAL | Status: DC | PRN

## 2012-09-12 MED ORDER — SODIUM CHLORIDE 0.9 % IV SOLN
INTRAVENOUS | Status: DC
  Administered 2012-09-12: 18:00:00 via INTRAVENOUS
  Administered 2012-09-13: 75 mL via INTRAVENOUS

## 2012-09-12 MED ORDER — MORPHINE SULFATE 2 MG/ML IJ SOLN: 2.0000 mg | INTRAMUSCULAR | Status: DC | PRN

## 2012-09-12 MED ORDER — HYDROMORPHONE HCL PF 1 MG/ML IJ SOLN: 0.2500 mg | INTRAMUSCULAR | Status: DC | PRN

## 2012-09-12 MED ORDER — ENALAPRIL MALEATE 20 MG PO TABS
40.0000 mg | ORAL_TABLET | Freq: Every day | ORAL | Status: DC
  Filled 2012-09-12: qty 2

## 2012-09-12 MED ORDER — SIMVASTATIN 40 MG PO TABS
40.0000 mg | ORAL_TABLET | Freq: Every day | ORAL | Status: DC
  Filled 2012-09-12 (×2): qty 1

## 2012-09-12 MED ORDER — OXYCODONE HCL 5 MG/5ML PO SOLN: 5.0000 mg | Freq: Once | ORAL | Status: DC | PRN

## 2012-09-12 MED ORDER — FENTANYL CITRATE 0.05 MG/ML IJ SOLN
INTRAMUSCULAR | Status: DC | PRN
  Administered 2012-09-12: 150 ug via INTRAVENOUS

## 2012-09-12 MED ORDER — LACTATED RINGERS IV SOLN
INTRAVENOUS | Status: DC | PRN
  Administered 2012-09-12: 20:00:00 via INTRAVENOUS

## 2012-09-12 MED ORDER — SUCCINYLCHOLINE CHLORIDE 20 MG/ML IJ SOLN
INTRAMUSCULAR | Status: DC | PRN
  Administered 2012-09-12: 200 mg via INTRAVENOUS

## 2012-09-12 MED ORDER — SPIRONOLACTONE 25 MG PO TABS
25.0000 mg | ORAL_TABLET | Freq: Every day | ORAL | Status: DC
  Filled 2012-09-12: qty 1

## 2012-09-12 MED ORDER — ASPIRIN EC 81 MG PO TBEC
81.0000 mg | DELAYED_RELEASE_TABLET | Freq: Every day | ORAL | Status: DC
  Filled 2012-09-12: qty 1

## 2012-09-12 MED ORDER — ISOSORBIDE MONONITRATE ER 30 MG PO TB24
30.0000 mg | ORAL_TABLET | Freq: Every day | ORAL | Status: DC
  Filled 2012-09-12: qty 1

## 2012-09-12 MED ORDER — HYDROMORPHONE HCL PF 1 MG/ML IJ SOLN
1.0000 mg | Freq: Once | INTRAMUSCULAR | Status: AC
  Administered 2012-09-12: 1 mg via INTRAVENOUS
  Filled 2012-09-12: qty 1

## 2012-09-12 MED ORDER — DEXTROSE-NACL 5-0.9 % IV SOLN: INTRAVENOUS | Status: DC

## 2012-09-12 MED ORDER — 0.9 % SODIUM CHLORIDE (POUR BTL) OPTIME
TOPICAL | Status: DC | PRN
  Administered 2012-09-12: 1000 mL

## 2012-09-12 MED ORDER — HYDROCODONE-ACETAMINOPHEN 7.5-325 MG PO TABS: 1.0000 | ORAL_TABLET | Freq: Four times a day (QID) | ORAL | Status: DC | PRN

## 2012-09-12 MED ORDER — HYDRALAZINE HCL 25 MG PO TABS
25.0000 mg | ORAL_TABLET | Freq: Three times a day (TID) | ORAL | Status: DC
  Administered 2012-09-13: 25 mg via ORAL
  Filled 2012-09-12 (×5): qty 1

## 2012-09-12 MED ORDER — AMOXICILLIN 500 MG PO CAPS: 500.0000 mg | ORAL_CAPSULE | Freq: Three times a day (TID) | ORAL | Status: DC

## 2012-09-12 MED ORDER — PROMETHAZINE HCL 25 MG PO TABS: 25.0000 mg | ORAL_TABLET | Freq: Four times a day (QID) | ORAL | Status: DC | PRN

## 2012-09-12 SURGICAL SUPPLY — 41 items
ATTRACTOMAT 16X20 MAGNETIC DRP (DRAPES) IMPLANT
CANISTER SUCTION 2500CC (MISCELLANEOUS) ×2 IMPLANT
CLEANER TIP ELECTROSURG 2X2 (MISCELLANEOUS) ×2 IMPLANT
CLOTH BEACON ORANGE TIMEOUT ST (SAFETY) ×2 IMPLANT
CONT SPEC 4OZ CLIKSEAL STRL BL (MISCELLANEOUS) ×2 IMPLANT
CORDS BIPOLAR (ELECTRODE) ×2 IMPLANT
DECANTER SPIKE VIAL GLASS SM (MISCELLANEOUS) ×1 IMPLANT
DRAIN CHANNEL 7F FF FLAT (WOUND CARE) IMPLANT
DRAIN PENROSE 1/4X12 LTX STRL (WOUND CARE) IMPLANT
DRAPE SURG 17X23 STRL (DRAPES) ×2 IMPLANT
ELECT COATED BLADE 2.86 ST (ELECTRODE) ×2 IMPLANT
ELECT PAIRED SUBDERMAL (MISCELLANEOUS)
ELECT REM PT RETURN 9FT ADLT (ELECTROSURGICAL) ×2
ELECTRODE PAIRED SUBDERMAL (MISCELLANEOUS) IMPLANT
ELECTRODE REM PT RTRN 9FT ADLT (ELECTROSURGICAL) ×1 IMPLANT
EVACUATOR SILICONE 100CC (DRAIN) IMPLANT
GLOVE BIOGEL M 7.0 STRL (GLOVE) ×1 IMPLANT
GLOVE ECLIPSE 7.5 STRL STRAW (GLOVE) ×1 IMPLANT
GOWN STRL NON-REIN LRG LVL3 (GOWN DISPOSABLE) ×4 IMPLANT
KIT BASIN OR (CUSTOM PROCEDURE TRAY) ×2 IMPLANT
KIT ROOM TURNOVER OR (KITS) ×2 IMPLANT
LOCATOR NERVE 3 VOLT (DISPOSABLE) IMPLANT
NS IRRIG 1000ML POUR BTL (IV SOLUTION) ×2 IMPLANT
PAD ARMBOARD 7.5X6 YLW CONV (MISCELLANEOUS) ×4 IMPLANT
PENCIL BUTTON HOLSTER BLD 10FT (ELECTRODE) ×2 IMPLANT
PROBE NERVBE PRASS .33 (MISCELLANEOUS) IMPLANT
SPECIMEN JAR SMALL (MISCELLANEOUS) ×2 IMPLANT
STAPLER VISISTAT 35W (STAPLE) ×1 IMPLANT
SUT ETHILON 3 0 PS 1 (SUTURE) IMPLANT
SUT ETHILON 5 0 P 3 18 (SUTURE)
SUT NYLON ETHILON 5-0 P-3 1X18 (SUTURE) IMPLANT
SUT SILK 2 0 REEL (SUTURE) IMPLANT
SUT SILK 3 0 REEL (SUTURE) ×1 IMPLANT
SUT VIC AB 3-0 FS2 27 (SUTURE) IMPLANT
SUT VICRYL 4-0 PS2 18IN ABS (SUTURE) IMPLANT
TRAY ENT MC OR (CUSTOM PROCEDURE TRAY) ×2 IMPLANT
TUBE ENDOTRAC EMG 7X10.2 (MISCELLANEOUS) IMPLANT
TUBE ENDOTRAC EMG 8X11.3 (MISCELLANEOUS) IMPLANT
TUBE ENDOTRACH  EMG 6MMTUBE EN (MISCELLANEOUS)
TUBE ENDOTRACH EMG 6MMTUBE EN (MISCELLANEOUS) IMPLANT
WATER STERILE IRR 1000ML POUR (IV SOLUTION) ×1 IMPLANT

## 2012-09-12 NOTE — ED Provider Notes (Signed)
Assumed care of the patient from PA [place]. CT soft tissue shows Large Submandibular salivary stone with suspected abscess.  There is some parapharyngeal swelling.  I have spoken with Dr. Pollyann Kennedy who will see the patient here in the ED. Patient has been given IV clindamycin 600 mg.  PE There is tight  swelling of the left submandibular area. Purulent dc is easily expressed from the Wharton's gland on the right.  Airway is patent.   Patient stone coould not be excised in the ED. Will be admitted for surgery.   Arthor Captain, PA-C 09/12/12 2337

## 2012-09-12 NOTE — ED Provider Notes (Signed)
Shelda Jakes, MD  Medical screening examination/treatment/procedure(s) were conducted as a shared visit with non-physician practitioner(s) and myself.  I personally evaluated the patient during the encounter  Patient seen by me. Patient with swelling under the submandibular area on the right side. In the oral pharynx area underneath the tongue at one of the ducts the salivary gland on the tongue there is purulent discharge. This consistent with a sublingual or salivary gland infection we'll going get his CT with contrast to further delineate limits basic labs. Will review antibiotic choice of clindamycin comes to mind. Also patient with concern for STD orally was going treat empirically for that as well.  Shelda Jakes, MD 09/12/12 [phone]

## 2012-09-12 NOTE — ED Notes (Signed)
Eric Murillo, [place] and Deretha Emory, MD at bedside for evaluation.

## 2012-09-12 NOTE — Anesthesia Procedure Notes (Signed)
Procedure Name: Intubation Date/Time: 09/12/2012 8:20 PM Performed by: Rogelia Boga Pre-anesthesia Checklist: Patient identified, Emergency Drugs available, Suction available, Patient being monitored and Timeout performed Patient Re-evaluated:Patient Re-evaluated prior to inductionOxygen Delivery Method: Circle system utilized Preoxygenation: Pre-oxygenation with 100% oxygen Intubation Type: IV induction Tube type: Oral Tube size: 7.5 mm Number of attempts: 2 Airway Equipment and Method: Video-laryngoscopy Placement Confirmation: ETT inserted through vocal cords under direct vision,  positive ETCO2 and breath sounds checked- equal and bilateral Secured at: 23 cm Tube secured with: Tape Dental Injury: Teeth and Oropharynx as per pre-operative assessment  Difficulty Due To: Difficulty was anticipated and Difficult Airway- due to large tongue Future Recommendations: Recommend- induction with short-acting agent, and alternative techniques readily available Comments: Unable to visualize cords due to redundant tissue, large tontue, ETT easily placed using Glidescope.

## 2012-09-12 NOTE — Transfer of Care (Signed)
Immediate Anesthesia Transfer of Care Note  Patient: Eric Murillo  Procedure(s) Performed: Procedure(s): Removal Right Submandibular Stone (Right)  Patient Location: PACU  Anesthesia Type:General  Level of Consciousness: awake, alert , oriented and patient cooperative  Airway & Oxygen Therapy: Patient Spontanous Breathing and Patient connected to face mask oxygen  Post-op Assessment: Report given to PACU RN, Post -op Vital signs reviewed and stable and Patient moving all extremities X 4  Post vital signs: Reviewed and stable  Complications: No apparent anesthesia complications

## 2012-09-12 NOTE — H&P (Signed)
Eric Murillo is an 52 y.o. male.   Chief Complaint: Neck pain and swelling HPI: 60 history of right submandibular pain and right floor of mouth pain.  Past Medical History  Diagnosis Date  . Hypertension   . Hyperlipidemia   . CHF (congestive heart failure)     EF 50-55%    . Erectile dysfunction   . CKD (chronic kidney disease)   . Obesity   . Gout     Past Surgical History  Procedure Laterality Date  . Colonoscopy      Family History  Problem Relation Age of Onset  . Hypertension Mother   . Hypertension Father   . Cancer Father     brother died of brain cancer; sister died of bone cancer  . Diabetes Sister   . Diabetes Brother   . Colon cancer Maternal Aunt   . Cardiomyopathy Neg Hx    Social History:  reports that he has never smoked. He has never used smokeless tobacco. He reports that he does not drink alcohol or use illicit drugs.  Allergies:  Allergies  Allergen Reactions  . Beta Adrenergic Blockers     REACTION: A white colored Beta Blocker made him feel funny.     (Not in a hospital admission)  Results for orders placed during the hospital encounter of 09/12/12 (from the past 48 hour(s))  RAPID STREP SCREEN     Status: None   Collection Time    09/12/12 12:17 PM      Result Value Range   Streptococcus, Group A Screen (Direct) NEGATIVE  NEGATIVE   Comment:            DUE TO INADEQUATE SENSITIVITY OF EIA     RAPID TESTS FOR GROUP A STREP (GAS)     IT IS RECOMMENDED THAT ALL NEGATIVE     RESULTS BE FOLLOWED BY A     GROUP A STREP PROBE.  POCT I-STAT, CHEM 8     Status: Abnormal   Collection Time    09/12/12  2:31 PM      Result Value Range   Sodium 143  135 - 145 mEq/L   Potassium 4.1  3.5 - 5.1 mEq/L   Chloride 105  96 - 112 mEq/L   BUN 23  6 - 23 mg/dL   Creatinine, Ser 1.61 (*) 0.50 - 1.35 mg/dL   Glucose, Bld 97  70 - 99 mg/dL   Calcium, Ion 0.96  0.45 - 1.23 mmol/L   TCO2 30  0 - 100 mmol/L   Hemoglobin 13.3  13.0 - 17.0 g/dL   HCT  40.9  81.1 - 91.4 %   Ct Soft Tissue Neck W Contrast  09/12/2012  *RADIOLOGY REPORT*  Clinical Data: Right-sided neck swelling and pain.  Oral swelling.  CT NECK WITH CONTRAST  Technique:  Multidetector CT imaging of the neck was performed with intravenous contrast.  Contrast: 75mL OMNIPAQUE IOHEXOL 300 MG/ML  SOLN  Comparison: None.  Findings: There is a 35 x 10 x 13 mm submandibular duct stone/concretion lying in the submandibular space on the right. There is slight enlargement and ductal dilatation of the right submandibular gland.  There is a 13 x 9 mm hypodense fluid collection posterior and superior to the stone suspicious for a submandibular space/oral cavity abscess. There is mild right parapharyngeal soft tissue swelling at the level of the oropharynx but  there is no compromise of the airway.  No significant periodontal disease.  Paranasal sinuses clear.  No vascular abnormality.  Right level II/III lymphadenopathy, likely reactive.  Normal parotid glands.  Negative osseous structures. Mild cervical spondylosis. Clear lung apices and mediastinum.  IMPRESSION: Large 35 x 10 x 13 mm submandibular duct stone/concretion. Adjacent 13 x 9 mm hypodense fluid collection, possible abscess.  Reactive adenopathy and parapharyngeal soft tissue swelling.   Original Report Authenticated By: Davonna Belling, M.D.     ROS: otherwise negative  Blood pressure 144/79, pulse 82, temperature 98.8 F (37.1 C), temperature source Oral, resp. rate 18, SpO2 95.00%.  PHYSICAL EXAM: Overall appearance:  Obese gentleman in obvious discomfort Head:  Normocephalic, atraumatic. Ears: External auditory canals are clear; tympanic membranes are intact and the middle ears are free of any effusion. Nose: External nose is healthy in appearance. Internal nasal exam free of any lesions or obstruction. Oral Cavity/pharynx:  Significant swelling of the right floor of mouth with purulent secretions draining from the right submandibular  duct is very tender to palpation. Neuro:  No identifiable neurologic deficits. Neck: Right submandibular gland enlarged, firm and tender.  Studies Reviewed: CT reviewed. There is a very large calculus in the right submandibular duct.    Assessment/Plan Right submandibular sialoadenitis and sialolithiasis. Stone extraction was attempted in the emergency department but he is very tender and it is very difficult to get him  anesthetized adequately to perform a thorough exploration and extraction of the stone. Recommend continued attempts under general anesthesia. We will do this this evening.  Piera Downs 09/12/2012, 6:04 PM

## 2012-09-12 NOTE — ED Notes (Signed)
Pt reports swelling in his mouth under his tongue. Reports he was seen at [facility] and given rx for anitbiotic and reports pain and swelling still present. Reports he had oral sex with a girl prior to all this starting. Reports painful swallowing.

## 2012-09-12 NOTE — ED Provider Notes (Signed)
Patient withright-sided submandibular pain and swelling onset 09/06/2012 seen at [facility] emergency Department prescribed penicillin and pain medicine without relief. On exam he is alert and nontoxic he has green pus emanating from his right-sided submandibular salivary gland. He is handling his secretions well  Doug Sou, MD 09/12/12 [phone]

## 2012-09-12 NOTE — Anesthesia Postprocedure Evaluation (Signed)
Anesthesia Post Note  Patient: Eric Murillo  Procedure(s) Performed: Procedure(s) (LRB): Removal Right Submandibular Stone (Right)  Anesthesia type: general  Patient location: PACU  Post pain: Pain level controlled  Post assessment: Patient's Cardiovascular Status Stable  Last Vitals:  Filed Vitals:   09/12/12 2100  BP:   Pulse: 95  Temp:   Resp: 13    Post vital signs: Reviewed and stable  Level of consciousness: sedated  Complications: No apparent anesthesia complications

## 2012-09-12 NOTE — ED Notes (Signed)
Assisted MD Pollyann Kennedy with attempt to calculi removal. Pt tolerated well.

## 2012-09-12 NOTE — Anesthesia Preprocedure Evaluation (Addendum)
Anesthesia Evaluation  Patient identified by MRN, date of birth, ID band Patient awake    Reviewed: Allergy & Precautions, H&P , NPO status , Patient's Chart, lab work & pertinent test results  Airway Mallampati: II TM Distance: >3 FB Neck ROM: Full    Dental  (+) Dental Advisory Given   Pulmonary sleep apnea ,          Cardiovascular hypertension, Pt. on medications and Pt. on home beta blockers + CAD and +CHF     Neuro/Psych    GI/Hepatic   Endo/Other  Morbid obesity  Renal/GU Renal InsufficiencyRenal disease     Musculoskeletal   Abdominal   Peds  Hematology   Anesthesia Other Findings   Reproductive/Obstetrics                          Anesthesia Physical Anesthesia Plan  ASA: III  Anesthesia Plan: General   Post-op Pain Management:    Induction: Intravenous  Airway Management Planned: Oral ETT  Additional Equipment:   Intra-op Plan:   Post-operative Plan: Extubation in OR  Informed Consent: I have reviewed the patients History and Physical, chart, labs and discussed the procedure including the risks, benefits and alternatives for the proposed anesthesia with the patient or authorized representative who has indicated his/her understanding and acceptance.     Plan Discussed with: CRNA, Surgeon and Anesthesiologist  Anesthesia Plan Comments:        Anesthesia Quick Evaluation

## 2012-09-12 NOTE — ED Notes (Signed)
MD Jacobowitz at bedside. 

## 2012-09-12 NOTE — ED Provider Notes (Signed)
History     CSN: [id number]  Arrival date & time 09/12/12  1154   First MD Initiated Contact with Patient 09/12/12 1232      Chief Complaint  Patient presents with  . Oral Swelling    (Consider location/radiation/quality/duration/timing/severity/associated sxs/prior treatment) HPI  52 year old male with history of chronic kidney disease and obesity presents complaining of throat pain. Patient reports he has gradual onset of pain and swelling to the right side of his tongue and neck. Pain is moderate to severe,  sharp and stabbing, radiating down to chin and neck, worse with swallowing or opening mouth. symptom has been ongoing since February 14.  He was initially seen at [facility] for this complaint, and was given Percocet, and penicillin. Patient reports he has been taking antibiotic for the full duration with no improvement. Report having oral sex with is gf prior to having these sxs.  Unsure if it's related.  Denies fever, chills, cp, sob, dental pain, n/v/d, abd pain or rash.    Past Medical History  Diagnosis Date  . Hypertension   . Hyperlipidemia   . CHF (congestive heart failure)     EF 50-55%    . Erectile dysfunction   . CKD (chronic kidney disease)   . Obesity   . Gout     Past Surgical History  Procedure Laterality Date  . Colonoscopy      Family History  Problem Relation Age of Onset  . Hypertension Mother   . Hypertension Father   . Cancer Father     brother died of brain cancer; sister died of bone cancer  . Diabetes Sister   . Diabetes Brother   . Colon cancer Maternal Aunt   . Cardiomyopathy Neg Hx     History  Substance Use Topics  . Smoking status: Never Smoker   . Smokeless tobacco: Never Used  . Alcohol Use: No      Review of Systems  Constitutional:       A complete 10 system review of systems was obtained and all systems are negative except as noted in the HPI and PMH.    Allergies  Beta adrenergic blockers  Home Medications    Current Outpatient Rx  Name  Route  Sig  Dispense  Refill  . aspirin EC 81 MG tablet   Oral   Take 81 mg by mouth daily.         . carvedilol (COREG) 25 MG tablet   Oral   Take 1 tablet (25 mg total) by mouth 2 (two) times daily with a meal.   180 tablet   3   . enalapril (VASOTEC) 20 MG tablet   Oral   Take 2 tablets (40 mg total) by mouth daily.   180 tablet   3   . furosemide (LASIX) 40 MG tablet   Oral   Take 40-80 mg by mouth daily. Takes 80mg  every morning and takes 40mg  every evening.         . hydrALAZINE (APRESOLINE) 25 MG tablet   Oral   Take 1 tablet (25 mg total) by mouth 3 (three) times daily.   90 tablet   3   . isosorbide mononitrate (IMDUR) 30 MG 24 hr tablet   Oral   Take 1 tablet (30 mg total) by mouth daily.   30 tablet   3   . simvastatin (ZOCOR) 40 MG tablet   Oral   Take 1 tablet (40 mg total) by mouth at bedtime.  90 tablet   3   . spironolactone (ALDACTONE) 25 MG tablet   Oral   Take 1 tablet (25 mg total) by mouth daily.   90 tablet   3   . oxyCODONE-acetaminophen (PERCOCET) 5-325 MG per tablet   Oral   Take 2 tablets by mouth every 4 (four) hours as needed for pain.   20 tablet   0     BP 128/57  Pulse 89  Temp(Src) 98.8 F (37.1 C) (Oral)  SpO2 98%  Physical Exam  Nursing note and vitals reviewed. Constitutional: He is oriented to person, place, and time. He appears well-developed and well-nourished. No distress.  HENT:  Head: Normocephalic and atraumatic.  Right Ear: External ear normal.  Left Ear: External ear normal.  Mild trismus noted  Uvula midline, no tonsillar enlargement.    R tongue mildly edematous inferiorly, pustular exudates can be expressed from salivary duct inferior to tongue. ttp  No dental involvement.    Eyes: Conjunctivae are normal.  Neck: Normal range of motion. Neck supple.  Cardiovascular: Normal rate and regular rhythm.   Pulmonary/Chest: Effort normal and breath sounds normal.  No respiratory distress. He has no wheezes.  Abdominal: Soft. There is no tenderness.  Musculoskeletal: Normal range of motion.  Lymphadenopathy:    He has cervical adenopathy (R sublingual lymphadenopathy, ttp.  ).  Neurological: He is alert and oriented to person, place, and time.  Skin: Skin is warm. No rash noted.  Psychiatric: He has a normal mood and affect.    ED Course  Procedures (including critical care time)  Labs Reviewed  RAPID STREP SCREEN  CBC WITH DIFFERENTIAL   Results for orders placed during the hospital encounter of 09/12/12  RAPID STREP SCREEN      Result Value Range   Streptococcus, Group A Screen (Direct) NEGATIVE  NEGATIVE  POCT I-STAT, CHEM 8      Result Value Range   Sodium 143  135 - 145 mEq/L   Potassium 4.1  3.5 - 5.1 mEq/L   Chloride 105  96 - 112 mEq/L   BUN 23  6 - 23 mg/dL   Creatinine, Ser 3.24 (*) 0.50 - 1.35 mg/dL   Glucose, Bld 97  70 - 99 mg/dL   Calcium, Ion 4.01  0.27 - 1.23 mmol/L   TCO2 30  0 - 100 mmol/L   Hemoglobin 13.3  13.0 - 17.0 g/dL   HCT 25.3  66.4 - 40.3 %   Ct Soft Tissue Neck W Contrast  09/12/2012  *RADIOLOGY REPORT*  Clinical Data: Right-sided neck swelling and pain.  Oral swelling.  CT NECK WITH CONTRAST  Technique:  Multidetector CT imaging of the neck was performed with intravenous contrast.  Contrast: 75mL OMNIPAQUE IOHEXOL 300 MG/ML  SOLN  Comparison: None.  Findings: There is a 35 x 10 x 13 mm submandibular duct stone/concretion lying in the submandibular space on the right. There is slight enlargement and ductal dilatation of the right submandibular gland.  There is a 13 x 9 mm hypodense fluid collection posterior and superior to the stone suspicious for a submandibular space/oral cavity abscess. There is mild right parapharyngeal soft tissue swelling at the level of the oropharynx but  there is no compromise of the airway.  No significant periodontal disease.  Paranasal sinuses clear.  No vascular abnormality.  Right  level II/III lymphadenopathy, likely reactive.  Normal parotid glands.  Negative osseous structures. Mild cervical spondylosis. Clear lung apices and mediastinum.  IMPRESSION: Large 35  x 10 x 13 mm submandibular duct stone/concretion. Adjacent 13 x 9 mm hypodense fluid collection, possible abscess.  Reactive adenopathy and parapharyngeal soft tissue swelling.   Original Report Authenticated By: Davonna Belling, M.D.      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  Since pt has been taking PCN abx for the full duration without improvement, will start pt on IV clinda, obtain basic labs, and will obtain neck CT scan for further evaluation.  Care discussed with attending who has seen and evaluate pt and agrees with plan.  No airway compromise at this time.   3:23 PM Care discussed with oncoming PA.  Will obtain neck CT, continue clindamycin IV.  May obtain throat culture to r/o GC infection.  Pt likely benefit from doxycycline abx x 14 days if CT shows no concerning finding.    1. Neck pain  MDM          Fayrene Helper, PA-C 09/13/12 0602

## 2012-09-12 NOTE — Preoperative (Addendum)
Beta Blockers   Reason not to administer Beta Blockers:Not Applicable 

## 2012-09-12 NOTE — Op Note (Signed)
OPERATIVE REPORT  DATE OF SURGERY: 09/12/2012  PATIENT:  Eric Murillo,  52 y.o. male  PRE-OPERATIVE DIAGNOSIS:  Right Submandibular Stone  POST-OPERATIVE DIAGNOSIS:  Right Submandibular Stone  PROCEDURE:  Procedure(s): Removal Right Submandibular Stone  SURGEON:  Susy Frizzle, MD  ASSISTANTS: none  ANESTHESIA:   General   EBL:  10 cc  DRAINS: none  LOCAL MEDICATIONS USED:  None  SPECIMEN:  none  COUNTS:  Correct  PROCEDURE DETAILS: The patient was taken to the operating room and placed on the operating table in the supine position. Following induction of general endotracheal anesthesia, the patient was draped in a standard fashion. A bite-block was used in the dentition to keep the mouth open. Suction and a sweetheart retractor were used to inspect the floor of mouth defect. Finger palpation was used to identify the calculus. A large tonsil hemostat was used to expose the soft tissue around the stone and the stone was retrieved. It was very large. It measured about 4-1/2 by 1/2 cm. The oral cavity was rinsed with saline and suctioned. A gauze packing was placed in the wound for about 5 minutes and then removed. The patient was awakened from anesthesia, extubated and transferred to recovery in stable condition    PATIENT DISPOSITION:  To PACU, stable

## 2012-09-13 ENCOUNTER — Encounter (HOSPITAL_COMMUNITY): Payer: Self-pay | Admitting: General Practice

## 2012-09-13 ENCOUNTER — Encounter: Payer: Self-pay | Admitting: Internal Medicine

## 2012-09-13 DIAGNOSIS — K115 Sialolithiasis: Secondary | ICD-10-CM

## 2012-09-13 DIAGNOSIS — K1120 Sialoadenitis, unspecified: Secondary | ICD-10-CM | POA: Insufficient documentation

## 2012-09-13 NOTE — ED Provider Notes (Signed)
Medical screening examination/treatment/procedure(s) were conducted as a shared visit with non-physician practitioner(s) and myself.  I personally evaluated the patient during the encounter   Shelda Jakes, MD 09/13/12 [phone]

## 2012-09-13 NOTE — Progress Notes (Signed)
Discharge patient. Home discharge instruction given, no questions verbalized. 

## 2012-09-13 NOTE — ED Provider Notes (Signed)
Medical screening examination/treatment/procedure(s) were conducted as a shared visit with non-physician practitioner(s) and myself.  I personally evaluated the patient during the encounter  Doug Sou, MD 09/13/12 [phone]

## 2012-09-13 NOTE — Discharge Summary (Signed)
Physician Discharge Summary  Patient ID: Eric Murillo MRN: [id number] DOB/AGE: Jul 11, 1961 52 y.o.  Admit date: 09/12/2012 Discharge date: 09/13/2012  Admission Diagnoses:Sialolithiasis  Discharge Diagnoses:  Active Problems:   * No active hospital problems. *   Discharged Condition: good  Hospital Course: significant improvement post op  Consults: none  Significant Diagnostic Studies: none  Treatments: surgery: submandibular stone extraction  Discharge Exam: Blood pressure 122/77, pulse 88, temperature 99.5 F (37.5 C), temperature source Oral, resp. rate 18, height 5' 9.5" (1.765 m), weight 312 lb (141.522 kg), SpO2 93.00%. PHYSICAL EXAM: No swelling or infection, feeling and eating much better.  Disposition: 01-Home or Self Care  Discharge Orders   Future Appointments Provider Department Dept Phone   09/17/2012 1:45 PM Laurey Morale, MD [facility] [place] [facility] [place]) [phone]   Future Orders Complete By Expires     Diet - low sodium heart healthy  As directed     Increase activity slowly  As directed         Medication List    TAKE these medications       amoxicillin 500 MG capsule  Commonly known as:  AMOXIL  Take 1 capsule (500 mg total) by mouth 3 (three) times daily.     aspirin EC 81 MG tablet  Take 81 mg by mouth daily.     carvedilol 25 MG tablet  Commonly known as:  COREG  Take 1 tablet (25 mg total) by mouth 2 (two) times daily with a meal.     enalapril 20 MG tablet  Commonly known as:  VASOTEC  Take 2 tablets (40 mg total) by mouth daily.     furosemide 40 MG tablet  Commonly known as:  LASIX  Take 40-80 mg by mouth daily. Takes 80mg  every morning and takes 40mg  every evening.     hydrALAZINE 25 MG tablet  Commonly known as:  APRESOLINE  Take 1 tablet (25 mg total) by mouth 3 (three) times daily.     HYDROcodone-acetaminophen 7.5-325 MG per tablet  Commonly known as:  NORCO  Take 1 tablet by mouth every 6 (six) hours as  needed for pain.     isosorbide mononitrate 30 MG 24 hr tablet  Commonly known as:  IMDUR  Take 1 tablet (30 mg total) by mouth daily.     oxyCODONE-acetaminophen 5-325 MG per tablet  Commonly known as:  PERCOCET  Take 2 tablets by mouth every 4 (four) hours as needed for pain.     promethazine 25 MG suppository  Commonly known as:  PHENERGAN  Place 1 suppository (25 mg total) rectally every 6 (six) hours as needed for nausea.     simvastatin 40 MG tablet  Commonly known as:  ZOCOR  Take 1 tablet (40 mg total) by mouth at bedtime.     spironolactone 25 MG tablet  Commonly known as:  ALDACTONE  Take 1 tablet (25 mg total) by mouth daily.           Follow-up Information   Follow up with Serena Colonel, MD. Schedule an appointment as soon as possible for a visit in 1 week.   Contact information:   [address] [place], [place] [address] [place] [address] [phone]       Signed: Serena Colonel 09/13/2012, 8:45 AM

## 2012-09-17 ENCOUNTER — Ambulatory Visit (INDEPENDENT_AMBULATORY_CARE_PROVIDER_SITE_OTHER): Payer: Self-pay | Admitting: Cardiology

## 2012-09-17 ENCOUNTER — Encounter: Payer: Self-pay | Admitting: Cardiology

## 2012-09-17 VITALS — BP 132/72 | HR 76 | Ht 69.5 in | Wt 308.0 lb

## 2012-09-17 LAB — BASIC METABOLIC PANEL
Chloride: 104 mEq/L (ref 96–112)
Creatinine, Ser: 1.6 mg/dL — ABNORMAL HIGH (ref 0.4–1.5)
GFR: 57.42 mL/min — ABNORMAL LOW (ref >60.00)
Potassium: 3.9 mEq/L (ref 3.5–5.1)

## 2012-09-17 LAB — BRAIN NATRIURETIC PEPTIDE: Pro B Natriuretic peptide (BNP): 128 pg/mL — ABNORMAL HIGH (ref 0.0–100.0)

## 2012-09-17 MED ORDER — FUROSEMIDE 40 MG PO TABS: ORAL_TABLET | ORAL | Status: DC

## 2012-09-17 MED ORDER — ISOSORBIDE MONONITRATE ER 60 MG PO TB24: 60.0000 mg | ORAL_TABLET | Freq: Every day | ORAL | Status: DC

## 2012-09-17 MED ORDER — HYDRALAZINE HCL 50 MG PO TABS: 50.0000 mg | ORAL_TABLET | Freq: Three times a day (TID) | ORAL | Status: DC

## 2012-09-17 NOTE — Patient Instructions (Addendum)
Increase lasix(furosemide) to 80mg  two times a day. This will be 2 of your 40mg  tablets two times a day.  Increase hydralazine to 50mg  three times a day. You can take 2 of your 25mg  tablets three times a day and use your current supply.  Increase Imdur (isosorbide) to 60mg  daily. You can take 2 of your 30mg  tablets daily at the same time and use your current supply.  Your physician recommends that you have  lab work today--BMET/BNP.  Your physician recommends that you return for lab work in: 2 weeks--BMET/BNP  Your physician recommends that you schedule a follow-up appointment in: 1 month with Dr Shirlee Latch.

## 2012-09-17 NOTE — Progress Notes (Signed)
Patient ID: Doctor Sheahan, male   DOB: 02-04-1961, 52 y.o.   MRN: [id number] PCP: Dr. Dierdre Searles  52 y.o. with history of obesity, hyperlipidemia, HTN, and nonischemic cardiomyopathy with CHF returns for cardiology followup.  Last echo in 6/13 showed EF 35-40%.  I tried to set him up for a cardiac MRI to more closely quantify EF to see if he was in ICD range, but he did not fit in the magnet.  He was diagnosed with severe OSA but has been unable to tolerate CPAP.  At last appointment, he looked volume overloaded so I had him increase his Lasix for a few days.    Since I last saw him, he had surgery for painful sialolithiasis.  He also was admitted to [facility] with systolic CHF exacerbation.  He apparently was for a couple of days and was diuresed with IV Lasix.  His weight is actually down 21 lbs since last appointment.  He is feeling overall better.  He is short of breath walking up steps but tends to be ok now on flat ground.  He sleeps on his side.  No PND.  No bendopnea.    Labs (3/12): K 4.1, creatinine 1.5, LDL 91, HDL 29, LFTs normal, BNP 260, HCT 43 Labs (6/12): K 4, creatinine 1.4, HIV negative, TSH normal, ANA normal, SPEP negative Labs (4/13): LDL 75, HDL 30, K 4.2, creatinine 4.09 Labs (7/13): BNP 356, K 3.9, creatinine 1.5 Labs (1/14): K 3.9, creatinine 1.4, LDL 73, HDL 29  Past Medical History: 1. Nonischemic cardiomyopathy: Echo (3/12) with EF 30-35% and mildly dilated LV, diffuse LV hypokinesis, moderate MR, PA systolic pressure 55 mmHg.  Left and right heart cath (3/12): No angiographic CAD; mean RA 20, PA 76/41, mean PCWP 38, CI 2.1.  ANA, SPEP, and HIV negative.  TSH normal.  Denies drug abuse, heavy ETOH intake.  No family history of cardiomyopathy.  Cardiomyopathy may be due to long-standing HTN.  Echo (6/13): EF 35-40%, moderate LV dilation, moderate to severe LAE, PA systolic pressure 52 mmHg.  Unable to fit in magnet for cardiac MRI.  2. ERECTILE DYSFUNCTION, ORGANIC (ICD-607.84) 3.  HYPERTENSION (ICD-401.9) 4. CKD 5. DYSLIPIDEMIA (ICD-272.4) 6. Obesity 7. Hyperlipidemia 8. Gout 9. OSA: Severe on 6/13 sleep study.  10. Sialolithiasis    Family History: No history of cardiomyopathy No history of cancer among first degree relatives father - died of MI (61s)  mother - HTN  Social History: Lives with mother.  Unemployed, formerly a Film/video editor.  1 sexual partner (male), no contraceptives.  1 adult child. tobacco - none alcohol - none drugs - none  Review of Systems        All systems reviewed and negative except as per HPI.   Current Outpatient Prescriptions  Medication Sig Dispense Refill  . amoxicillin (AMOXIL) 500 MG capsule Take 1 capsule (500 mg total) by mouth 3 (three) times daily.  21 capsule  0  . aspirin EC 81 MG tablet Take 81 mg by mouth daily.      . carvedilol (COREG) 25 MG tablet Take 1 tablet (25 mg total) by mouth 2 (two) times daily with a meal.  180 tablet  3  . enalapril (VASOTEC) 20 MG tablet Take 2 tablets (40 mg total) by mouth daily.  180 tablet  3  . HYDROcodone-acetaminophen (NORCO) 7.5-325 MG per tablet Take 1 tablet by mouth every 6 (six) hours as needed for pain.  30 tablet  0  . promethazine (PHENERGAN) 25 MG suppository  Place 1 suppository (25 mg total) rectally every 6 (six) hours as needed for nausea.  12 each  0  . simvastatin (ZOCOR) 40 MG tablet Take 1 tablet (40 mg total) by mouth at bedtime.  90 tablet  3  . spironolactone (ALDACTONE) 25 MG tablet Take 1 tablet (25 mg total) by mouth daily.  90 tablet  3  . furosemide (LASIX) 40 MG tablet 2 tablets (total 80mg ) two times a day  120 tablet  6  . hydrALAZINE (APRESOLINE) 50 MG tablet Take 1 tablet (50 mg total) by mouth 3 (three) times daily.  90 tablet  6  . isosorbide mononitrate (IMDUR) 60 MG 24 hr tablet Take 1 tablet (60 mg total) by mouth daily.  30 tablet  6   No current facility-administered medications for this visit.    BP 132/72  Pulse 76  Ht 5' 9.5" (1.765  m)  Wt 308 lb (139.708 kg)  BMI 44.85 kg/m2  SpO2 98% General: NAD, obese.  Neck:  Thick, JVP 8 cm, no thyromegaly or thyroid nodule.  Lungs: Clear to auscultation bilaterally with normal respiratory effort. CV: Nondisplaced PMI.  Heart regular S1/S2, +S4, no murmur.  1+ edema in ankles bilaterally.  No carotid bruit.   Abdomen: Soft, nontender, no hepatosplenomegaly, no distention.  Neurologic: Alert and oriented x 3.  Psych: Normal affect. Extremities: No clubbing or cyanosis.   Assessment/Plan:  Systolic heart failure  Nonischemic cardiomyopathy, possibly due to long-standing poorly-controlled HTN. Stable NYHA class III symptoms, somewhat improved after hospitalization at [facility] with IV diuresis.  EF 35-40% on recent echo with diffuse hypokinesis. Wanted to quantify EF more closely by MRI to see if he would be ICD candidate but he could not fit in the magnet.  - He has become volume overloaded taking Lasix 80 qam/40 qpm.  I am going to increase Lasix to 80 mg bid with BMET/BNP in 2 wks.  - Continue current doses of enalapril, Coreg, and spironolactone. - Increase hydralazine to 50 mg tid and Imdur to 60 mg daily.  - He does not quite qualify for ICD by echo, will repeat echo in 8/14 after advancing medical therapy.  - Followup in 1 month.  RENAL INSUFFICIENCY Check BMET today.  HYPERTENSION  BP control reasonable.  OSA (obstructive sleep apnea)  Severe OSA. He has not tolerated CPAP.  [name] 09/17/2012 11:32 PM

## 2012-09-30 ENCOUNTER — Other Ambulatory Visit (INDEPENDENT_AMBULATORY_CARE_PROVIDER_SITE_OTHER): Payer: Self-pay

## 2012-09-30 LAB — BASIC METABOLIC PANEL
BUN: 23 mg/dL (ref 6–23)
Chloride: 105 mEq/L (ref 96–112)
Creatinine, Ser: 1.5 mg/dL (ref 0.4–1.5)
GFR: 65.71 mL/min (ref >60.00)
Potassium: 3.5 mEq/L (ref 3.5–5.1)

## 2012-09-30 LAB — BRAIN NATRIURETIC PEPTIDE: Pro B Natriuretic peptide (BNP): 215 pg/mL — ABNORMAL HIGH (ref 0.0–100.0)

## 2012-10-22 ENCOUNTER — Encounter: Payer: Self-pay | Admitting: Internal Medicine

## 2012-10-23 ENCOUNTER — Ambulatory Visit (INDEPENDENT_AMBULATORY_CARE_PROVIDER_SITE_OTHER): Payer: Self-pay | Admitting: Cardiology

## 2012-10-23 ENCOUNTER — Encounter: Payer: Self-pay | Admitting: Cardiology

## 2012-10-23 VITALS — BP 122/62 | HR 60 | Ht 69.5 in | Wt 314.0 lb

## 2012-10-23 DIAGNOSIS — I5022 Chronic systolic (congestive) heart failure: Secondary | ICD-10-CM

## 2012-10-23 DIAGNOSIS — I2510 Atherosclerotic heart disease of native coronary artery without angina pectoris: Secondary | ICD-10-CM

## 2012-10-23 DIAGNOSIS — I5020 Unspecified systolic (congestive) heart failure: Secondary | ICD-10-CM

## 2012-10-23 DIAGNOSIS — N259 Disorder resulting from impaired renal tubular function, unspecified: Secondary | ICD-10-CM

## 2012-10-23 LAB — BASIC METABOLIC PANEL
Calcium: 8.7 mg/dL (ref 8.4–10.5)
GFR: 70.74 mL/min (ref >60.00)
Glucose, Bld: 100 mg/dL — ABNORMAL HIGH (ref 70–99)
Potassium: 3.2 mEq/L — ABNORMAL LOW (ref 3.5–5.1)
Sodium: 141 mEq/L (ref 135–145)

## 2012-10-23 NOTE — Patient Instructions (Addendum)
Increase hydralazine to 50mg  three times a day. Take 2 of your 25mg  tablets three times a day and use your current supply. WHen you run out of your current supply the new prescription will be for hydralazine 50mg  three times a day.   Your physician recommends that you have  lab work today--BMET/BNP.  Your physician recommends that you schedule a follow-up appointment in: 1 month with Dr Shirlee Latch.

## 2012-10-23 NOTE — Progress Notes (Signed)
Patient ID: Eric Murillo, male   DOB: [birth year], 52 y.o.   MRN: [id number] PCP: Dr. Dierdre Searles  52 yo with history of obesity, hyperlipidemia, HTN, and nonischemic cardiomyopathy with CHF returns for cardiology followup.  Last echo in 6/13 showed EF 35-40%.  I tried to set him up for a cardiac MRI to more closely quantify EF to see if he was in ICD range, but he did not fit in the magnet.  He was diagnosed with severe OSA but has been unable to tolerate CPAP.  At last appointment, I increased his Lasix to 80 mg bid.    Currently, he is primarily limited by left ankle pain.  He is short of breath after walking 100 yards or walking up 1 flight of steps.  This is stable.  No chest pain.  Weight is up 6 lbs.  He has been following a relatively high sodium diet.  Also, he has only been taking hydralazine 25 mg tid rather than 50 mg tid.   Labs (3/12): K 4.1, creatinine 1.5, LDL 91, HDL 29, LFTs normal, BNP 260, HCT 43 Labs (6/12): K 4, creatinine 1.4, HIV negative, TSH normal, ANA normal, SPEP negative Labs (4/13): LDL 75, HDL 30, K 4.2, creatinine 3.24 Labs (7/13): BNP 356, K 3.9, creatinine 1.5 Labs (1/14): K 3.9, creatinine 1.4, LDL 73, HDL 29 Labs (3/14): BNP 215, K 3.5, creatinine 1.5  Past Medical History: 1. Nonischemic cardiomyopathy: Echo (3/12) with EF 30-35% and mildly dilated LV, diffuse LV hypokinesis, moderate MR, PA systolic pressure 55 mmHg.  Left and right heart cath (3/12): No angiographic CAD; mean RA 20, PA 76/41, mean PCWP 38, CI 2.1.  ANA, SPEP, and HIV negative.  TSH normal.  Denies drug abuse, heavy ETOH intake.  No family history of cardiomyopathy.  Cardiomyopathy may be due to long-standing HTN.  Echo (6/13): EF 35-40%, moderate LV dilation, moderate to severe LAE, PA systolic pressure 52 mmHg.  Unable to fit in magnet for cardiac MRI.  2. ERECTILE DYSFUNCTION, ORGANIC (ICD-607.84) 3. HYPERTENSION (ICD-401.9) 4. CKD 5. DYSLIPIDEMIA (ICD-272.4) 6. Obesity 7. Hyperlipidemia 8.  Gout 9. OSA: Severe on 6/13 sleep study.  10. Sialolithiasis    Family History: No history of cardiomyopathy No history of cancer among first degree relatives father - died of MI (34s)  mother - HTN  Social History: Lives with mother.  Unemployed, formerly a Film/video editor.  1 sexual partner (male), no contraceptives.  1 adult child. tobacco - none alcohol - none drugs - none  Review of Systems        All systems reviewed and negative except as per HPI.   Current Outpatient Prescriptions  Medication Sig Dispense Refill  . amoxicillin (AMOXIL) 500 MG capsule Take 1 capsule (500 mg total) by mouth 3 (three) times daily.  21 capsule  0  . aspirin EC 81 MG tablet Take 81 mg by mouth daily.      . carvedilol (COREG) 25 MG tablet Take 1 tablet (25 mg total) by mouth 2 (two) times daily with a meal.  180 tablet  3  . enalapril (VASOTEC) 20 MG tablet Take 2 tablets (40 mg total) by mouth daily.  180 tablet  3  . furosemide (LASIX) 40 MG tablet 2 tablets (total 80mg ) two times a day  120 tablet  6  . hydrALAZINE (APRESOLINE) 50 MG tablet Take 1 tablet (50 mg total) by mouth 3 (three) times daily.  90 tablet  6  . HYDROcodone-acetaminophen (NORCO) 7.5-325 MG per  tablet Take 1 tablet by mouth every 6 (six) hours as needed for pain.  30 tablet  0  . isosorbide mononitrate (IMDUR) 60 MG 24 hr tablet Take 1 tablet (60 mg total) by mouth daily.  30 tablet  6  . promethazine (PHENERGAN) 25 MG suppository Place 1 suppository (25 mg total) rectally every 6 (six) hours as needed for nausea.  12 each  0  . simvastatin (ZOCOR) 40 MG tablet Take 1 tablet (40 mg total) by mouth at bedtime.  90 tablet  3  . spironolactone (ALDACTONE) 25 MG tablet Take 1 tablet (25 mg total) by mouth daily.  90 tablet  3   No current facility-administered medications for this visit.    BP 122/62  Pulse 60  Ht 5' 9.5" (1.765 m)  Wt 314 lb (142.429 kg)  BMI 45.72 kg/m2  SpO2 98% General: NAD, obese.  Neck:  Thick, JVP  7-8 cm, no thyromegaly or thyroid nodule.  Lungs: Clear to auscultation bilaterally with normal respiratory effort. CV: Nondisplaced PMI.  Heart regular S1/S2, +S4, no murmur.  Trace edema in ankles bilaterally.  No carotid bruit.   Abdomen: Soft, nontender, no hepatosplenomegaly, no distention.  Neurologic: Alert and oriented x 3.  Psych: Normal affect. Extremities: No clubbing or cyanosis.   Assessment/Plan:  Systolic heart failure  Nonischemic cardiomyopathy, possibly due to long-standing poorly-controlled HTN. Stable NYHA class III symptoms.  EF 35-40% on recent echo with diffuse hypokinesis. Wanted to quantify EF more closely by MRI to see if he would be ICD candidate but he could not fit in the magnet.  - Continue current Lasix dosing (80 mg bid) but would like him to cut back considerably on his dietary salt intake.  We discussed this extensively today. - Continue current doses of enalapril, Coreg, and spironolactone. - Increase hydralazine to 50 mg tid, continue Imdur at 60 mg daily.  - He does not quite qualify for ICD by echo, will repeat echo in 8/14 after advancing medical therapy.  - BMET/BNP today.  RENAL INSUFFICIENCY Check BMET today.  HYPERTENSION  BP control reasonable.  OSA (obstructive sleep apnea)  Severe OSA. He has not tolerated CPAP.  [name] 10/23/2012 2:32 PM

## 2012-10-24 ENCOUNTER — Other Ambulatory Visit: Payer: Self-pay | Admitting: *Deleted

## 2012-10-24 ENCOUNTER — Ambulatory Visit: Payer: Self-pay

## 2012-10-24 MED ORDER — POTASSIUM CHLORIDE CRYS ER 20 MEQ PO TBCR: 20.0000 meq | EXTENDED_RELEASE_TABLET | Freq: Every day | ORAL | Status: DC

## 2012-11-01 ENCOUNTER — Telehealth: Payer: Self-pay

## 2012-11-01 ENCOUNTER — Ambulatory Visit: Payer: Self-pay

## 2012-11-01 NOTE — Telephone Encounter (Signed)
Pt walked into office and stated that he is out of most of his medications and that [facility], where [facility] sent [facility] Rx refills to be filled at a discounted rate, has closed and forwarded all of his medication refills to [facility]. The pt states he cant afford his medications through [facility]. Diane, at [facility], states that if pt has his orange card they can provide pt with most of his meds, pt does have a orange card. She states that they will not have his meds until Monday because it is too late in the day today for [facility] to transfer his Rx's to them. Pt is advised to go to or call [facility] on [address] to see if he can purchase enough medications until Monday when he can get them through the [facility]. He verbalized understanding.

## 2012-11-04 ENCOUNTER — Ambulatory Visit: Payer: Self-pay

## 2012-11-14 ENCOUNTER — Encounter: Payer: Self-pay | Admitting: Internal Medicine

## 2012-11-14 ENCOUNTER — Ambulatory Visit (INDEPENDENT_AMBULATORY_CARE_PROVIDER_SITE_OTHER): Payer: No Typology Code available for payment source | Admitting: Internal Medicine

## 2012-11-14 VITALS — BP 131/90 | HR 65 | Temp 97.8°F | Ht 69.5 in | Wt 321.2 lb

## 2012-11-14 DIAGNOSIS — I10 Essential (primary) hypertension: Secondary | ICD-10-CM

## 2012-11-14 DIAGNOSIS — M109 Gout, unspecified: Secondary | ICD-10-CM

## 2012-11-14 LAB — MAGNESIUM: Magnesium: 2.1 mg/dL (ref 1.5–2.5)

## 2012-11-14 LAB — URIC ACID: Uric Acid, Serum: 9.9 mg/dL — ABNORMAL HIGH (ref 4.0–7.8)

## 2012-11-14 NOTE — Assessment & Plan Note (Addendum)
Patient is noted to have a mild hypokalemia with K 3.2 during his cardiologist office visit.  Patient states that the potassium supplement was called in to his pharmacy but he didn't pick up.  Denies chest pain, palpitation, muscle weakness and pain.  - will check his BMP and Mg  BP Readings from Last 3 Encounters:  11/14/12 131/90  10/23/12 122/62  09/17/12 132/72    Lab Results  Component Value Date   Alayia Meggison 141 10/23/2012   K 3.2* 10/23/2012   CREATININE 1.4 10/23/2012    Assessment: Blood pressure control: moderately elevated Progress toward BP goal:  unchanged Comments:   Plan: Medications:  continue current medications Educational resources provided: brochure Self management tools provided: home blood pressure logbook Other plans:

## 2012-11-14 NOTE — Patient Instructions (Addendum)
General Instructions: 1. Will check your BMP, Mg and Uric acid level. 2. Follow up in 3 months.   Treatment Goals:  Goals (1 Years of Data) as of 11/14/12         As of Today 10/23/12 09/17/12 09/13/12 09/13/12     Blood Pressure    . Blood Pressure < 130/80  145/100 122/62 132/72 122/77 146/83     Diet    . Reduce fat intake- eliminate trans fats as much as possible           Result Component    . LDL CALC < 100           Weight    . Weight < 320 lb (145.151 kg)  321 lb 3.2 oz (145.695 kg) 314 lb (142.429 kg) 308 lb (139.708 kg) 312 lb (141.522 kg)       Progress Toward Treatment Goals:  Treatment Goal 11/14/2012  Blood pressure unchanged    Self Care Goals & Plans:  Self Care Goal 11/14/2012  Manage my medications take my medicines as prescribed  Monitor my health keep track of my blood pressure  Eat healthy foods drink diet soda or water instead of juice or soda  Meeting treatment goals maintain the current self-care plan       Care Management & Community Referrals:  Referral 11/14/2012  Referrals made for care management support none needed; nutritionist  Referrals made to community resources exercise/physical therapy    Gout Gout is an inflammatory condition (arthritis) caused by a buildup of uric acid crystals in the joints. Uric acid is a chemical that is normally present in the blood. Under some circumstances, uric acid can form into crystals in your joints. This causes joint redness, soreness, and swelling (inflammation). Repeat attacks are common. Over time, uric acid crystals can form into masses (tophi) near a joint, causing disfigurement. Gout is treatable and often preventable. CAUSES  The disease begins with elevated levels of uric acid in the blood. Uric acid is produced by your body when it breaks down a naturally found substance called purines. This also happens when you eat certain foods such as meats and fish. Causes of an elevated uric acid level  include:  Being passed down from parent to child (heredity).  Diseases that cause increased uric acid production (obesity, psoriasis, some cancers).  Excessive alcohol use.  Diet, especially diets rich in meat and seafood.  Medicines, including certain cancer-fighting drugs (chemotherapy), diuretics, and aspirin.  Chronic kidney disease. The kidneys are no longer able to remove uric acid well.  Problems with metabolism. Conditions strongly associated with gout include:  Obesity.  High blood pressure.  High cholesterol.  Diabetes. Not everyone with elevated uric acid levels gets gout. It is not understood why some people get gout and others do not. Surgery, joint injury, and eating too much of certain foods are some of the factors that can lead to gout. SYMPTOMS   An attack of gout comes on quickly. It causes intense pain with redness, swelling, and warmth in a joint.  Fever can occur.  Often, only one joint is involved. Certain joints are more commonly involved:  Base of the big toe.  Knee.  Ankle.  Wrist.  Finger. Without treatment, an attack usually goes away in a few days to weeks. Between attacks, you usually will not have symptoms, which is different from many other forms of arthritis. DIAGNOSIS  Your caregiver will suspect gout based on your symptoms and exam. Removal  of fluid from the joint (arthrocentesis) is done to check for uric acid crystals. Your caregiver will give you a medicine that numbs the area (local anesthetic) and use a needle to remove joint fluid for exam. Gout is confirmed when uric acid crystals are seen in joint fluid, using a special microscope. Sometimes, blood, urine, and X-ray tests are also used. TREATMENT  There are 2 phases to gout treatment: treating the sudden onset (acute) attack and preventing attacks (prophylaxis). Treatment of an Acute Attack  Medicines are used. These include anti-inflammatory medicines or steroid  medicines.  An injection of steroid medicine into the affected joint is sometimes necessary.  The painful joint is rested. Movement can worsen the arthritis.  You may use warm or cold treatments on painful joints, depending which works best for you.  Discuss the use of coffee, vitamin C, or cherries with your caregiver. These may be helpful treatment options. Treatment to Prevent Attacks After the acute attack subsides, your caregiver may advise prophylactic medicine. These medicines either help your kidneys eliminate uric acid from your body or decrease your uric acid production. You may need to stay on these medicines for a very long time. The early phase of treatment with prophylactic medicine can be associated with an increase in acute gout attacks. For this reason, during the first few months of treatment, your caregiver may also advise you to take medicines usually used for acute gout treatment. Be sure you understand your caregiver's directions. You should also discuss dietary treatment with your caregiver. Certain foods such as meats and fish can increase uric acid levels. Other foods such as dairy can decrease levels. Your caregiver can give you a list of foods to avoid. HOME CARE INSTRUCTIONS   Do not take aspirin to relieve pain. This raises uric acid levels.  Only take over-the-counter or prescription medicines for pain, discomfort, or fever as directed by your caregiver.  Rest the joint as much as possible. When in bed, keep sheets and blankets off painful areas.  Keep the affected joint raised (elevated).  Use crutches if the painful joint is in your leg.  Drink enough water and fluids to keep your urine clear or pale yellow. This helps your body get rid of uric acid. Do not drink alcoholic beverages. They slow the passage of uric acid.  Follow your caregiver's dietary instructions. Pay careful attention to the amount of protein you eat. Your daily diet should emphasize  fruits, vegetables, whole grains, and fat-free or low-fat milk products.  Maintain a healthy body weight. SEEK MEDICAL CARE IF:   You have an oral temperature above 102 F (38.9 C).  You develop diarrhea, vomiting, or any side effects from medicines.  You do not feel better in 24 hours, or you are getting worse. SEEK IMMEDIATE MEDICAL CARE IF:   Your joint becomes suddenly more tender and you have:  Chills.  An oral temperature above 102 F (38.9 C), not controlled by medicine. MAKE SURE YOU:   Understand these instructions.  Will watch your condition.  Will get help right away if you are not doing well or get worse. Document Released: 07/07/2000 Document Revised: 10/02/2011 Document Reviewed: 10/18/2009 [facility] Patient Information 2013 [place], [place].

## 2012-11-14 NOTE — Assessment & Plan Note (Addendum)
He doesn't have acute flareup currently.  Patient admits to eat a lot of red meat and seafood prior to his gout flareups.  However he declines to take prophylactic treatment at this point.  Patient is counseled on changing his diet to decrease recurrence of gout flareups.  - Will check uric acid level  Addendum  Uric acid level 9.9. I tried contact patient but unable to reach him. I left a message for patient call back to the clinic on Monday to have Mamie to page me. (Patient told me that he does not want to take any prophylactic treatment for his gout during the OV).

## 2012-11-14 NOTE — Progress Notes (Signed)
Patient ID: Eric Murillo, male   DOB: 1961/01/28, 52 y.o.   MRN: [id number]  Subjective:   Patient ID: Eric Murillo male   DOB: 1960/08/05 52 y.o.   MRN: [id number]  HPI: [name] is a 52 y.o. with history of obesity, hyperlipidemia, HTN, and nonischemic cardiomyopathy with CHF who presents to the clinic for follow up visit. He states that he has been compliant with all his medications, including ASA, Coreg, lasix, indoor, hydralazine, spirolactone and Zocor.  1. HTN He was noted to be hypertensive initially when he presented to the clinic.  He reports that he did not take his morning antihypertensive medications.  His blood pressure was much better 30 minutes after he rested and took his pills.  2. Gout Patient is noted to have a history of gout but not take any medications.  He adamantly declines any prophylactic treatment for gout at this point despite the discussion of the benefit from the preventive treatment.  He agrees for me to check his uric acid level. .pmh   No headache, fever, or sore throat. No shortness of breath or dyspnea on exertion. No chest pain, chest pressure or palpitation No nausea, vomiting, or abdominal pain. No melena, diarrhea or incontinence. No muscle weakness. Denies depression. No appetite or weight changes.    l  Past Medical History:  1. Nonischemic cardiomyopathy: Echo (3/12) with EF 30-35% and mildly dilated LV, diffuse LV hypokinesis, moderate MR, PA systolic pressure 55 mmHg. Left and right heart cath (3/12): No angiographic CAD; mean RA 20, PA 76/41, mean PCWP 38, CI 2.1. ANA and HIV negative. TSH normal. Denies drug abuse, heavy ETOH intake. No family history of cardiomyopathy. Cardiomyopathy may be due to long-standing HTN.  2. ERECTILE DYSFUNCTION, ORGANIC (ICD-607.84)  3. HYPERTENSION (ICD-401.9)  4. CKD  5. DYSLIPIDEMIA (ICD-272.4)  6. Obesity  7. Hyperlipidemia   Current Outpatient Prescriptions  Medication Sig Dispense Refill  .  aspirin EC 81 MG tablet Take 81 mg by mouth daily.      . carvedilol (COREG) 25 MG tablet Take 1 tablet (25 mg total) by mouth 2 (two) times daily with a meal.  180 tablet  3  . enalapril (VASOTEC) 20 MG tablet Take 2 tablets (40 mg total) by mouth daily.  180 tablet  3  . furosemide (LASIX) 40 MG tablet 2 tablets (total 80mg ) two times a day  120 tablet  6  . hydrALAZINE (APRESOLINE) 50 MG tablet Take 1 tablet (50 mg total) by mouth 3 (three) times daily.  90 tablet  6  . HYDROcodone-acetaminophen (NORCO) 7.5-325 MG per tablet Take 1 tablet by mouth every 6 (six) hours as needed for pain.  30 tablet  0  . isosorbide mononitrate (IMDUR) 60 MG 24 hr tablet Take 1 tablet (60 mg total) by mouth daily.  30 tablet  6  . potassium chloride SA (K-DUR,KLOR-CON) 20 MEQ tablet Take 1 tablet (20 mEq total) by mouth daily.  30 tablet  6  . simvastatin (ZOCOR) 40 MG tablet Take 1 tablet (40 mg total) by mouth at bedtime.  90 tablet  3  . spironolactone (ALDACTONE) 25 MG tablet Take 1 tablet (25 mg total) by mouth daily.  90 tablet  3   No current facility-administered medications for this visit.   Family History  Problem Relation Age of Onset  . Hypertension Mother   . Hypertension Father   . Cancer Father     brother died of brain cancer; sister died of bone  cancer  . Diabetes Sister   . Diabetes Brother   . Colon cancer Maternal Aunt   . Cardiomyopathy Neg Hx    History   Social History  . Marital Status: Single    Spouse Name: N/A    Number of Children: N/A  . Years of Education: N/A   Social History Main Topics  . Smoking status: Never Smoker   . Smokeless tobacco: Never Used  . Alcohol Use: No  . Drug Use: No  . Sexually Active: None   Other Topics Concern  . None   Social History Narrative   Financial assistance approved for 100% discount at [facility] and has [facility] card   [employer]  March 16, 2010 9:42 AM      Lives with mother.  Has a girlfriend   Review of Systems: See  HPI  Objective:  Physical Exam: Filed Vitals:   11/14/12 1522 11/14/12 1555  BP: 145/100 131/90  Pulse: 60 65  Temp: 97.8 F (36.6 C)   TempSrc: Oral   Height: 5' 9.5" (1.765 m)   Weight: 321 lb 3.2 oz (145.695 kg)   SpO2: 97%    General: alert, well-developed, and cooperative to examination.  Head: normocephalic and atraumatic.  Eyes: vision grossly intact, pupils equal, pupils round, pupils reactive to light, no injection and anicteric.  Mouth: pharynx pink and moist, no erythema, and no exudates.  Neck: supple, full ROM, no thyromegaly, no JVD, and no carotid bruits.  Lungs: normal respiratory effort, no accessory muscle use, normal breath sounds, no crackles, and no wheezes. Heart: normal rate, regular rhythm, no murmur, no gallop, and no rub.  Abdomen: soft, non-tender, normal bowel sounds, no distention, no guarding, no rebound tenderness, no hepatomegaly, and no splenomegaly.  Msk: no joint swelling, no joint warmth, and no redness over joints.  Pulses: 2+ DP/PT pulses bilaterally Extremities: No cyanosis, clubbing, edema Neurologic: alert & oriented X3, cranial nerves II-XII intact, strength normal in all extremities, sensation intact to light touch, and gait normal.  Skin: turgor normal and no rashes.  Psych: Oriented X3, memory intact for recent and remote, normally interactive, good eye contact, not anxious appearing, and not depressed appearing.   Assessment & Plan:

## 2012-11-15 LAB — BASIC METABOLIC PANEL WITH GFR
BUN: 15 mg/dL (ref 6–23)
Chloride: 106 mEq/L (ref 96–112)
GFR, Est African American: 69 mL/min
GFR, Est Non African American: 60 mL/min
Potassium: 4.2 mEq/L (ref 3.5–5.3)
Sodium: 142 mEq/L (ref 135–145)

## 2012-11-20 ENCOUNTER — Telehealth: Payer: Self-pay | Admitting: Internal Medicine

## 2012-11-20 NOTE — Telephone Encounter (Signed)
  INTERNAL MEDICINE RESIDENCY PROGRAM After-Hours Telephone Call    Reason for call:   I placed an outgoing call to [name] at 1200 noon regarding his elevated uric acid level.  He reports recurrent gout attacks in the past.  He goes to the emergency room or urgent care for his treatments.  His last uric acid was 9.1 approximately 9 months ago.  I talked to him about the initiation of prophylactic treatment of gout during last office visit in April 2014.  However patient refused to start any prophylactic treatment for the gout despite the fact that he understood the benefits of the treatment.  His repeated uric acid level is 9.9 on 11/14/2012.  I discussed with him over the telephone about the initiation of antigout prophylactic treatment.  He again refused the treatment.  He states that he would rather to have the treatment for acute gout what it happens.      Assessment/ Plan:   I encouraged him to think over the initiation of antigout prophylactic treatment, and he could call me if he decides to start the treatment.  I encouraged him to call the clinic if he has signs and symptoms of acute gout.  He understands that he has a higher chance of getting recurrent acute gout attack.  As always, pt is advised that if symptoms worsen or new symptoms arise, they should go to an urgent care facility or to to ER for further evaluation.    Eric Query, MD   11/20/2012, 1:56 PM

## 2012-12-13 ENCOUNTER — Ambulatory Visit: Payer: Self-pay | Admitting: Cardiology

## 2012-12-17 ENCOUNTER — Encounter: Payer: Self-pay | Admitting: Cardiology

## 2012-12-17 ENCOUNTER — Other Ambulatory Visit: Payer: Self-pay | Admitting: *Deleted

## 2012-12-17 ENCOUNTER — Ambulatory Visit (INDEPENDENT_AMBULATORY_CARE_PROVIDER_SITE_OTHER): Payer: No Typology Code available for payment source | Admitting: Cardiology

## 2012-12-17 ENCOUNTER — Telehealth: Payer: Self-pay | Admitting: Cardiology

## 2012-12-17 VITALS — BP 118/72 | HR 88 | Ht 69.0 in | Wt 317.0 lb

## 2012-12-17 DIAGNOSIS — I10 Essential (primary) hypertension: Secondary | ICD-10-CM

## 2012-12-17 DIAGNOSIS — I5020 Unspecified systolic (congestive) heart failure: Secondary | ICD-10-CM

## 2012-12-17 DIAGNOSIS — M109 Gout, unspecified: Secondary | ICD-10-CM

## 2012-12-17 DIAGNOSIS — I2510 Atherosclerotic heart disease of native coronary artery without angina pectoris: Secondary | ICD-10-CM

## 2012-12-17 DIAGNOSIS — I5022 Chronic systolic (congestive) heart failure: Secondary | ICD-10-CM

## 2012-12-17 LAB — BASIC METABOLIC PANEL
Calcium: 9.2 mg/dL (ref 8.4–10.5)
GFR: 65.66 mL/min (ref >60.00)
Potassium: 3.7 mEq/L (ref 3.5–5.1)
Sodium: 140 mEq/L (ref 135–145)

## 2012-12-17 MED ORDER — COLCHICINE 0.6 MG PO TABS: ORAL_TABLET | ORAL | Status: DC

## 2012-12-17 MED ORDER — TRAMADOL HCL 50 MG PO TABS: 50.0000 mg | ORAL_TABLET | Freq: Two times a day (BID) | ORAL | Status: DC | PRN

## 2012-12-17 NOTE — Progress Notes (Signed)
Patient ID: Eric Murillo, male   DOB: 1960-12-11, 52 y.o.   MRN: [id number] PCP: Dr. Dierdre Searles  52 yo with history of obesity, hyperlipidemia, HTN, and nonischemic cardiomyopathy with CHF returns for cardiology followup.  Last echo in 6/13 showed EF 35-40%.  I tried to set him up for a cardiac MRI to more closely quantify EF to see if he was in ICD range, but he did not fit in the magnet.  He was diagnosed with severe OSA but has been unable to tolerate CPAP.  His Lasix has been increased to 80 mg bid.    He is stable symptomatically.  Weight is up 3 lbs compared to prior appointment in our office but he says he has actually been losing weight lately according to his home scales.  He denies exertional dyspnea today.  No orthopnea or chest pain.   BP has been under control.  He has had a gout flare involving the great toe on his right foot.  This is still hurting.  He is not on any medications for it.   Labs (3/12): K 4.1, creatinine 1.5, LDL 91, HDL 29, LFTs normal, BNP 260, HCT 43 Labs (6/12): K 4, creatinine 1.4, HIV negative, TSH normal, ANA normal, SPEP negative Labs (4/13): LDL 75, HDL 30, K 4.2, creatinine 7.82 Labs (7/13): BNP 356, K 3.9, creatinine 1.5 Labs (1/14): K 3.9, creatinine 1.4, LDL 73, HDL 29 Labs (3/14): BNP 215, K 3.5, creatinine 1.5 Labs (4/14): K 4.2, creatinine 1.35  Past Medical History: 1. Nonischemic cardiomyopathy: Echo (3/12) with EF 30-35% and mildly dilated LV, diffuse LV hypokinesis, moderate MR, PA systolic pressure 55 mmHg.  Left and right heart cath (3/12): No angiographic CAD; mean RA 20, PA 76/41, mean PCWP 38, CI 2.1.  ANA, SPEP, and HIV negative.  TSH normal.  Denies drug abuse, heavy ETOH intake.  No family history of cardiomyopathy.  Cardiomyopathy may be due to long-standing HTN.  Echo (6/13): EF 35-40%, moderate LV dilation, moderate to severe LAE, PA systolic pressure 52 mmHg.  Unable to fit in magnet for cardiac MRI.  2. ERECTILE DYSFUNCTION, ORGANIC  (ICD-607.84) 3. HYPERTENSION (ICD-401.9) 4. CKD 5. DYSLIPIDEMIA (ICD-272.4) 6. Obesity 7. Hyperlipidemia 8. Gout 9. OSA: Severe on 6/13 sleep study.  10. Sialolithiasis    Family History: No history of cardiomyopathy No history of cancer among first degree relatives father - died of MI (20s)  mother - HTN  Social History: Lives with mother.  Unemployed, formerly a Film/video editor.  1 sexual partner (male), no contraceptives.  1 adult child. tobacco - none alcohol - none drugs - none  Review of Systems        All systems reviewed and negative except as per HPI.   Current Outpatient Prescriptions  Medication Sig Dispense Refill  . aspirin EC 81 MG tablet Take 81 mg by mouth daily.      . carvedilol (COREG) 25 MG tablet Take 1 tablet (25 mg total) by mouth 2 (two) times daily with a meal.  180 tablet  3  . enalapril (VASOTEC) 20 MG tablet Take 2 tablets (40 mg total) by mouth daily.  180 tablet  3  . furosemide (LASIX) 40 MG tablet 2 tablets (total 80mg ) two times a day  120 tablet  6  . hydrALAZINE (APRESOLINE) 50 MG tablet Take 1 tablet (50 mg total) by mouth 3 (three) times daily.  90 tablet  6  . HYDROcodone-acetaminophen (NORCO) 7.5-325 MG per tablet Take 1 tablet by mouth  every 6 (six) hours as needed for pain.  30 tablet  0  . isosorbide mononitrate (IMDUR) 60 MG 24 hr tablet Take 1 tablet (60 mg total) by mouth daily.  30 tablet  6  . potassium chloride SA (K-DUR,KLOR-CON) 20 MEQ tablet Take 1 tablet (20 mEq total) by mouth daily.  30 tablet  6  . simvastatin (ZOCOR) 40 MG tablet Take 1 tablet (40 mg total) by mouth at bedtime.  90 tablet  3  . spironolactone (ALDACTONE) 25 MG tablet Take 1 tablet (25 mg total) by mouth daily.  90 tablet  3  . colchicine 0.6 MG tablet 1 tablet two times a day for one day, then 1 tablet daily until the pain is gone  30 tablet  3   No current facility-administered medications for this visit.    BP 118/72  Pulse 88  Ht 5\' 9"  (1.753 m)  Wt  317 lb (143.79 kg)  BMI 46.79 kg/m2 General: NAD, obese.  Neck:  Thick, JVP 7-8 cm, no thyromegaly or thyroid nodule.  Lungs: Clear to auscultation bilaterally with normal respiratory effort. CV: Nondisplaced PMI.  Heart regular S1/S2, +S4, no murmur.  1+ ankle edema.  No carotid bruit.   Abdomen: Soft, nontender, no hepatosplenomegaly, no distention.  Neurologic: Alert and oriented x 3.  Psych: Normal affect. Extremities: No clubbing or cyanosis.   Assessment/Plan:  Systolic heart failure  Nonischemic cardiomyopathy, possibly due to long-standing poorly-controlled HTN. Stable NYHA class III symptoms.  EF 35-40% on last echo with diffuse hypokinesis. Wanted to quantify EF more closely by MRI to see if he would be ICD candidate but he could not fit in the magnet.  - Continue current Lasix and watch sodium intake. - Continue current doses of enalapril (except take it 20 mg bid rather than 40 mg once daily), Coreg, and spironolactone. - Continue current hydralazine/Imdur.   - Repeat echo in 8/14, ? Need for ICD.   - BMET today.  RENAL INSUFFICIENCY Check BMET today.  HYPERTENSION  BP control reasonable.  OSA (obstructive sleep apnea)  Severe OSA. He has not tolerated CPAP. Gout Gout flare right great toe.  He can take colchicine 0.6 mg bid today then 0.6 mg daily for 2 weeks or so until the pain resolves.  Can followup with PCP for allopurinol or Uloric consideration.   [name] 12/17/2012 12:51 PM

## 2012-12-17 NOTE — Telephone Encounter (Signed)
New Prob     Pt has some questions regarding a prescription. Would like to speak to nurse.

## 2012-12-17 NOTE — Telephone Encounter (Signed)
Spoke with patient.

## 2012-12-17 NOTE — Patient Instructions (Addendum)
Take colchicine 0.6mg  two times a day for one day, then 1 tablet daily until the pain is gone.   Your physician recommends that you have  lab work today--BMET.   Your physician has requested that you have an echocardiogram. Echocardiography is a painless test that uses sound waves to create images of your heart. It provides your doctor with information about the size and shape of your heart and how well your heart's chambers and valves are working. This procedure takes approximately one hour. There are no restrictions for this procedure. August 2014  Your physician recommends that you schedule a follow-up appointment with Dr Shirlee Latch in August after you have your echocardiogram.

## 2013-01-14 ENCOUNTER — Other Ambulatory Visit: Payer: Self-pay | Admitting: Cardiology

## 2013-01-20 ENCOUNTER — Ambulatory Visit (INDEPENDENT_AMBULATORY_CARE_PROVIDER_SITE_OTHER): Payer: No Typology Code available for payment source | Admitting: Internal Medicine

## 2013-01-20 VITALS — BP 127/81 | HR 82 | Temp 97.4°F | Ht 69.0 in | Wt 315.2 lb

## 2013-01-20 DIAGNOSIS — I509 Heart failure, unspecified: Secondary | ICD-10-CM

## 2013-01-20 DIAGNOSIS — I10 Essential (primary) hypertension: Secondary | ICD-10-CM

## 2013-01-20 DIAGNOSIS — M109 Gout, unspecified: Secondary | ICD-10-CM

## 2013-01-20 DIAGNOSIS — R739 Hyperglycemia, unspecified: Secondary | ICD-10-CM

## 2013-01-20 DIAGNOSIS — R7309 Other abnormal glucose: Secondary | ICD-10-CM

## 2013-01-20 LAB — POCT GLYCOSYLATED HEMOGLOBIN (HGB A1C): Hemoglobin A1C: 5.6

## 2013-01-20 NOTE — Progress Notes (Signed)
Patient ID: Eric Murillo, male   DOB: 04-13-1961, 52 y.o.   MRN: [id number]  Subjective:   Patient ID: Eric Murillo male   DOB: 1960-10-29 52 y.o.   MRN: [id number]  HPI: [name] is a 52 y.o. with history of obesity, hyperlipidemia, HTN, severe OSA not tolerate CPAP, and nonischemic cardiomyopathy with CHF followed by [facility] cardiology who presents to the clinic for follow up visit. He states that he has been compliant with all his medications, including ASA, Coreg, lasix, indoor, hydralazine, spirolactone and Zocor.  1. HTN Well controlled on current medications.    2. Gout Patient is noted to have a history of gout but not take any medications.  He adamantly declines any prophylactic treatment for gout at this point despite the discussion of the benefit from the preventive treatment in the past.  His Uric acid level was elevated at 9.9 in April 2014 and he suffered acute gout flares in May per Dr. Marca Murillo office note. He was treated with short course of Colchicine. He is asymptomatic today.   3. CHF: Well controlled and followed by Dr Eric Murillo. Weight down 2 lbs since 12/17/12.   No headache, fever, or sore throat. No shortness of breath or dyspnea on exertion. No chest pain, chest pressure or palpitation No nausea, vomiting, or abdominal pain. No melena, diarrhea or incontinence. No muscle weakness. Denies depression. No appetite or weight changes.    l  Past Medical History:  Nonischemic cardiomyopathy: Echo (3/12) with EF 30-35% and mildly dilated LV, diffuse LV hypokinesis, moderate MR, PA systolic pressure 55 mmHg. Left and right heart cath (3/12): No angiographic CAD; mean RA 20, PA 76/41, mean PCWP 38, CI 2.1. ANA and HIV negative. TSH normal. Denies drug abuse, heavy ETOH intake. No family history of cardiomyopathy. Cardiomyopathy may be due to long-standing HTN.     Current Outpatient Prescriptions  Medication Sig Dispense Refill  . carvedilol (COREG) 25 MG  tablet Take 1 tablet (25 mg total) by mouth 2 (two) times daily with a meal.  180 tablet  3  . colchicine 0.6 MG tablet 1 tablet two times a day for one day, then 1 tablet daily until the pain is gone  30 tablet  3  . enalapril (VASOTEC) 20 MG tablet Take 2 tablets (40 mg total) by mouth daily.  180 tablet  3  . enalapril (VASOTEC) 20 MG tablet TAKE TWO TABLETS BY MOUTH EVERY DAY  60 tablet  6  . furosemide (LASIX) 40 MG tablet 2 tablets (total 80mg ) two times a day  120 tablet  6  . hydrALAZINE (APRESOLINE) 50 MG tablet Take 1 tablet (50 mg total) by mouth 3 (three) times daily.  90 tablet  6  . HYDROcodone-acetaminophen (NORCO) 7.5-325 MG per tablet Take 1 tablet by mouth every 6 (six) hours as needed for pain.  30 tablet  0  . isosorbide mononitrate (IMDUR) 60 MG 24 hr tablet Take 1 tablet (60 mg total) by mouth daily.  30 tablet  6  . potassium chloride SA (K-DUR,KLOR-CON) 20 MEQ tablet Take 1 tablet (20 mEq total) by mouth daily.  30 tablet  6  . simvastatin (ZOCOR) 40 MG tablet Take 1 tablet (40 mg total) by mouth at bedtime.  90 tablet  3  . spironolactone (ALDACTONE) 25 MG tablet Take 1 tablet (25 mg total) by mouth daily.  90 tablet  3  . traMADol (ULTRAM) 50 MG tablet Take 1 tablet (50 mg total) by mouth  2 (two) times daily as needed for pain.  30 tablet  0  . aspirin EC 81 MG tablet Take 81 mg by mouth daily.       No current facility-administered medications for this visit.   Family History  Problem Relation Age of Onset  . Hypertension Mother   . Hypertension Father   . Cancer Father     brother died of brain cancer; sister died of bone cancer  . Diabetes Sister   . Diabetes Brother   . Colon cancer Maternal Aunt   . Cardiomyopathy Neg Hx    History   Social History  . Marital Status: Single    Spouse Name: N/A    Number of Children: N/A  . Years of Education: N/A   Social History Main Topics  . Smoking status: Never Smoker   . Smokeless tobacco: Never Used  .  Alcohol Use: No  . Drug Use: No  . Sexually Active: Not on file   Other Topics Concern  . Not on file   Social History Narrative   Financial assistance approved for 100% discount at [facility] and has [facility] card   [employer]  March 16, 2010 9:42 AM      Lives with mother.  Has a girlfriend   Review of Systems: See HPI  Objective:  Physical Exam: Filed Vitals:   01/20/13 1539  BP: 127/81  Pulse: 82  Temp: 97.4 F (36.3 C)  TempSrc: Oral  Height: 5\' 9"  (1.753 m)  Weight: 315 lb 3.2 oz (142.974 kg)  SpO2: 96%   General: alert, well-developed, and cooperative to examination.  Head: normocephalic and atraumatic.  Eyes: vision grossly intact, pupils equal, pupils round, pupils reactive to light, no injection and anicteric.  Mouth: pharynx pink and moist, no erythema, and no exudates.  Neck: supple, full ROM, no thyromegaly, JVD 7- 8cm, and no carotid bruits.  Lungs: normal respiratory effort, no accessory muscle use, normal breath sounds, no crackles, and no wheezes. Heart: normal rate, regular rhythm, no murmur, no gallop, and no rub.  Abdomen: soft, non-tender, normal bowel sounds, no distention, no guarding, no rebound tenderness, no hepatomegaly, and no splenomegaly.  Msk: no joint swelling, no joint warmth, and no redness over joints.  Pulses: 2+ DP/PT pulses bilaterally Extremities: No cyanosis, clubbing, 1+ ankle edema Neurologic: alert & oriented X3, cranial nerves II-XII intact, strength normal in all extremities, sensation intact to light touch, and gait normal.  Skin: turgor normal and no rashes.  Psych: Oriented X3, memory intact for recent and remote, normally interactive, good eye contact, not anxious appearing, and not depressed appearing.   Assessment & Plan:

## 2013-01-20 NOTE — Progress Notes (Signed)
Case discussed with Dr. Li soon after the resident saw the patient. We reviewed the resident's history and exam and pertinent patient test results. I agree with the assessment, diagnosis, and plan of care documented in the resident's note. 

## 2013-01-20 NOTE — Patient Instructions (Addendum)
1. Continue the current medication therapy.  2. Follow up in 3 months.  Gout Gout is an inflammatory condition (arthritis) caused by a buildup of uric acid crystals in the joints. Uric acid is a chemical that is normally present in the blood. Under some circumstances, uric acid can form into crystals in your joints. This causes joint redness, soreness, and swelling (inflammation). Repeat attacks are common. Over time, uric acid crystals can form into masses (tophi) near a joint, causing disfigurement. Gout is treatable and often preventable. CAUSES  The disease begins with elevated levels of uric acid in the blood. Uric acid is produced by your body when it breaks down a naturally found substance called purines. This also happens when you eat certain foods such as meats and fish. Causes of an elevated uric acid level include:  Being passed down from parent to child (heredity).  Diseases that cause increased uric acid production (obesity, psoriasis, some cancers).  Excessive alcohol use.  Diet, especially diets rich in meat and seafood.  Medicines, including certain cancer-fighting drugs (chemotherapy), diuretics, and aspirin.  Chronic kidney disease. The kidneys are no longer able to remove uric acid well.  Problems with metabolism. Conditions strongly associated with gout include:  Obesity.  High blood pressure.  High cholesterol.  Diabetes. Not everyone with elevated uric acid levels gets gout. It is not understood why some people get gout and others do not. Surgery, joint injury, and eating too much of certain foods are some of the factors that can lead to gout. SYMPTOMS   An attack of gout comes on quickly. It causes intense pain with redness, swelling, and warmth in a joint.  Fever can occur.  Often, only one joint is involved. Certain joints are more commonly involved:  Base of the big toe.  Knee.  Ankle.  Wrist.  Finger. Without treatment, an attack usually goes  away in a few days to weeks. Between attacks, you usually will not have symptoms, which is different from many other forms of arthritis. DIAGNOSIS  Your caregiver will suspect gout based on your symptoms and exam. Removal of fluid from the joint (arthrocentesis) is done to check for uric acid crystals. Your caregiver will give you a medicine that numbs the area (local anesthetic) and use a needle to remove joint fluid for exam. Gout is confirmed when uric acid crystals are seen in joint fluid, using a special microscope. Sometimes, blood, urine, and X-ray tests are also used. TREATMENT  There are 2 phases to gout treatment: treating the sudden onset (acute) attack and preventing attacks (prophylaxis). Treatment of an Acute Attack  Medicines are used. These include anti-inflammatory medicines or steroid medicines.  An injection of steroid medicine into the affected joint is sometimes necessary.  The painful joint is rested. Movement can worsen the arthritis.  You may use warm or cold treatments on painful joints, depending which works best for you.  Discuss the use of coffee, vitamin C, or cherries with your caregiver. These may be helpful treatment options. Treatment to Prevent Attacks After the acute attack subsides, your caregiver may advise prophylactic medicine. These medicines either help your kidneys eliminate uric acid from your body or decrease your uric acid production. You may need to stay on these medicines for a very long time. The early phase of treatment with prophylactic medicine can be associated with an increase in acute gout attacks. For this reason, during the first few months of treatment, your caregiver may also advise you to take  medicines usually used for acute gout treatment. Be sure you understand your caregiver's directions. You should also discuss dietary treatment with your caregiver. Certain foods such as meats and fish can increase uric acid levels. Other foods such  as dairy can decrease levels. Your caregiver can give you a list of foods to avoid. HOME CARE INSTRUCTIONS   Do not take aspirin to relieve pain. This raises uric acid levels.  Only take over-the-counter or prescription medicines for pain, discomfort, or fever as directed by your caregiver.  Rest the joint as much as possible. When in bed, keep sheets and blankets off painful areas.  Keep the affected joint raised (elevated).  Use crutches if the painful joint is in your leg.  Drink enough water and fluids to keep your urine clear or pale yellow. This helps your body get rid of uric acid. Do not drink alcoholic beverages. They slow the passage of uric acid.  Follow your caregiver's dietary instructions. Pay careful attention to the amount of protein you eat. Your daily diet should emphasize fruits, vegetables, whole grains, and fat-free or low-fat milk products.  Maintain a healthy body weight. SEEK MEDICAL CARE IF:   You have an oral temperature above 102 F (38.9 C).  You develop diarrhea, vomiting, or any side effects from medicines.  You do not feel better in 24 hours, or you are getting worse. SEEK IMMEDIATE MEDICAL CARE IF:   Your joint becomes suddenly more tender and you have:  Chills.  An oral temperature above 102 F (38.9 C), not controlled by medicine. MAKE SURE YOU:   Understand these instructions.  Will watch your condition.  Will get help right away if you are not doing well or get worse. Document Released: 07/07/2000 Document Revised: 10/02/2011 Document Reviewed: 10/18/2009 [facility] Patient Information 2014 [place], [place].

## 2013-01-20 NOTE — Assessment & Plan Note (Signed)
Last gout flareup was in May when he was treated by Dr. [name]. He is asymptomatic for past 3 weeks. Last Uric acid was 9.9 in April 2014.  - detailed discussion about gout communicated with patient.  - he continues to decline any prophylactic treatment.  - will follow up

## 2013-01-20 NOTE — Assessment & Plan Note (Addendum)
BP Readings from Last 3 Encounters:  01/20/13 127/81  12/17/12 118/72  11/14/12 131/90    Lab Results  Component Value Date   Eric Murillo 140 12/17/2012   K 3.7 12/17/2012   CREATININE 1.5 12/17/2012    Assessment: Blood pressure control: controlled Progress toward BP goal:  at goal Comments:   Plan: Medications:  continue current medications Educational resources provided:   Self management tools provided:   Other plans:    Well controlled on current therapy. Reports medical compliance.  - continue the current therapy. - screening HGB A1C - Microalbumin

## 2013-01-20 NOTE — Assessment & Plan Note (Addendum)
He is asymptomatic. Physical examination reveals euvolemic.  Last OV with [facility] cardiology was in May 2014  - will continue current therapy, he is compliant with his medications. -continue to follow up with your cardiologist.

## 2013-01-21 LAB — MICROALBUMIN / CREATININE URINE RATIO
Creatinine, Urine: 109.6 mg/dL
Microalb Creat Ratio: 7.5 mg/g (ref 0.0–30.0)
Microalb, Ur: 0.82 mg/dL (ref 0.00–1.89)

## 2013-02-22 ENCOUNTER — Emergency Department (HOSPITAL_COMMUNITY)
Admission: EM | Admit: 2013-02-22 | Discharge: 2013-02-22 | Disposition: A | Discharge status: Home / Self Care | Payer: No Typology Code available for payment source | Attending: Emergency Medicine | Admitting: Emergency Medicine

## 2013-02-22 ENCOUNTER — Emergency Department (HOSPITAL_COMMUNITY): Payer: No Typology Code available for payment source

## 2013-02-22 ENCOUNTER — Encounter (HOSPITAL_COMMUNITY): Payer: Self-pay | Admitting: *Deleted

## 2013-02-22 DIAGNOSIS — M25579 Pain in unspecified ankle and joints of unspecified foot: Secondary | ICD-10-CM | POA: Insufficient documentation

## 2013-02-22 DIAGNOSIS — R609 Edema, unspecified: Secondary | ICD-10-CM | POA: Insufficient documentation

## 2013-02-22 DIAGNOSIS — Z888 Allergy status to other drugs, medicaments and biological substances status: Secondary | ICD-10-CM | POA: Insufficient documentation

## 2013-02-22 DIAGNOSIS — Z862 Personal history of diseases of the blood and blood-forming organs and certain disorders involving the immune mechanism: Secondary | ICD-10-CM | POA: Insufficient documentation

## 2013-02-22 DIAGNOSIS — N529 Male erectile dysfunction, unspecified: Secondary | ICD-10-CM | POA: Insufficient documentation

## 2013-02-22 DIAGNOSIS — I509 Heart failure, unspecified: Secondary | ICD-10-CM | POA: Insufficient documentation

## 2013-02-22 DIAGNOSIS — N189 Chronic kidney disease, unspecified: Secondary | ICD-10-CM | POA: Insufficient documentation

## 2013-02-22 DIAGNOSIS — M7730 Calcaneal spur, unspecified foot: Secondary | ICD-10-CM | POA: Insufficient documentation

## 2013-02-22 DIAGNOSIS — M25571 Pain in right ankle and joints of right foot: Secondary | ICD-10-CM

## 2013-02-22 DIAGNOSIS — Z79899 Other long term (current) drug therapy: Secondary | ICD-10-CM | POA: Insufficient documentation

## 2013-02-22 DIAGNOSIS — E785 Hyperlipidemia, unspecified: Secondary | ICD-10-CM | POA: Insufficient documentation

## 2013-02-22 DIAGNOSIS — Z7982 Long term (current) use of aspirin: Secondary | ICD-10-CM | POA: Insufficient documentation

## 2013-02-22 DIAGNOSIS — E669 Obesity, unspecified: Secondary | ICD-10-CM | POA: Insufficient documentation

## 2013-02-22 DIAGNOSIS — Z8639 Personal history of other endocrine, nutritional and metabolic disease: Secondary | ICD-10-CM | POA: Insufficient documentation

## 2013-02-22 DIAGNOSIS — I129 Hypertensive chronic kidney disease with stage 1 through stage 4 chronic kidney disease, or unspecified chronic kidney disease: Secondary | ICD-10-CM | POA: Insufficient documentation

## 2013-02-22 MED ORDER — OXYCODONE-ACETAMINOPHEN 5-325 MG PO TABS
1.0000 | ORAL_TABLET | Freq: Once | ORAL | Status: AC
  Administered 2013-02-22: 1 via ORAL
  Filled 2013-02-22: qty 1

## 2013-02-22 MED ORDER — OXYCODONE-ACETAMINOPHEN 5-325 MG PO TABS: 1.0000 | ORAL_TABLET | ORAL | Status: DC | PRN

## 2013-02-22 MED ORDER — PREDNISONE 10 MG PO TABS: 20.0000 mg | ORAL_TABLET | Freq: Every day | ORAL | Status: DC

## 2013-02-22 NOTE — ED Provider Notes (Signed)
  Medical screening examination/treatment/procedure(s) were performed by non-physician practitioner and as supervising physician I was immediately available for consultation/collaboration.    Vonnetta Akey, MD 02/22/13 2359 

## 2013-02-22 NOTE — ED Notes (Signed)
ED PA at bedside

## 2013-02-22 NOTE — ED Notes (Signed)
Pt states pain when standing

## 2013-02-22 NOTE — ED Notes (Addendum)
Pt c/o right sided lower extremity swelling. States hx: of gout, CHF.

## 2013-02-22 NOTE — ED Provider Notes (Signed)
CSN: [id number]     Arrival date & time 02/22/13  2029 History     First MD Initiated Contact with Patient 02/22/13 2238     Chief Complaint  Patient presents with  . Ankle Pain   (Consider location/radiation/quality/duration/timing/severity/associated sxs/prior Treatment) The history is provided by the patient and medical records.    Patient presents to the ED for right ankle pain since this morning. He denies any recent injury or trauma.  Pain described as a constant, throbbing sensation in his anterior right ankle. Pain worse with weightbearing, ambulation, and ROM.  Patient also has significant peripheral pitting edema bilaterally, states he did not take his Lasix this morning because he was having trouble ambulating due to ankle pain. Patient has history of gout and during recent visit with PCP his elevated uric acid levels.  PCP has tried to put him on maintenance allopurinol but he has declined.  Patient states he cannot take colchicine due to renal insufficiency. He normally uses steroids and Percocet for his flares with good relief.  No meds taken PTA.  Medical records reviewed-- on 11/14/12 uric acid level was 9.9  Past Medical History  Diagnosis Date  . Hypertension   . Hyperlipidemia   . CHF (congestive heart failure)     EF 50-55%    . Erectile dysfunction   . CKD (chronic kidney disease)   . Obesity   . Gout    Past Surgical History  Procedure Laterality Date  . Colonoscopy    . Salivary gland surgery  09/12/2012  . Cardiac catheterization  2012  . Submandibular gland excision Right 09/12/2012    Procedure: Removal Right Submandibular Larina Bras;  Surgeon: Serena Colonel, MD;  Location: [facility] OR;  Service: ENT;  Laterality: Right;   Family History  Problem Relation Age of Onset  . Hypertension Mother   . Hypertension Father   . Cancer Father     brother died of brain cancer; sister died of bone cancer  . Diabetes Sister   . Diabetes Brother   . Colon cancer Maternal Aunt     . Cardiomyopathy Neg Hx    History  Substance Use Topics  . Smoking status: Never Smoker   . Smokeless tobacco: Never Used  . Alcohol Use: No    Review of Systems  Musculoskeletal: Positive for joint swelling and arthralgias.  All other systems reviewed and are negative.    Allergies  Beta adrenergic blockers  Home Medications   Current Outpatient Rx  Name  Route  Sig  Dispense  Refill  . aspirin EC 81 MG tablet   Oral   Take 81 mg by mouth daily.         . carvedilol (COREG) 25 MG tablet   Oral   Take 1 tablet (25 mg total) by mouth 2 (two) times daily with a meal.   180 tablet   3   . enalapril (VASOTEC) 20 MG tablet      TAKE TWO TABLETS BY MOUTH EVERY DAY   60 tablet   6   . furosemide (LASIX) 40 MG tablet      2 tablets (total 80mg ) two times a day   120 tablet   6   . hydrALAZINE (APRESOLINE) 50 MG tablet   Oral   Take 1 tablet (50 mg total) by mouth 3 (three) times daily.   90 tablet   6   . isosorbide mononitrate (IMDUR) 60 MG 24 hr tablet   Oral   Take  1 tablet (60 mg total) by mouth daily.   30 tablet   6   . potassium chloride SA (K-DUR,KLOR-CON) 20 MEQ tablet   Oral   Take 1 tablet (20 mEq total) by mouth daily.   30 tablet   6   . simvastatin (ZOCOR) 40 MG tablet   Oral   Take 1 tablet (40 mg total) by mouth at bedtime.   90 tablet   3   . spironolactone (ALDACTONE) 25 MG tablet   Oral   Take 1 tablet (25 mg total) by mouth daily.   90 tablet   3   . traMADol (ULTRAM) 50 MG tablet   Oral   Take 1 tablet (50 mg total) by mouth 2 (two) times daily as needed for pain.   30 tablet   0    BP 134/83  Pulse 98  Temp(Src) 98.8 F (37.1 C) (Oral)  Resp 18  SpO2 97%  Physical Exam  Nursing note and vitals reviewed. Constitutional: He is oriented to person, place, and time. He appears well-developed and well-nourished.  HENT:  Head: Normocephalic and atraumatic.  Mouth/Throat: Oropharynx is clear and moist.  Eyes:  Conjunctivae and EOM are normal. Pupils are equal, round, and reactive to light.  Neck: Normal range of motion. Neck supple.  Cardiovascular: Normal rate, regular rhythm and normal heart sounds.   Pulmonary/Chest: Effort normal and breath sounds normal.  Musculoskeletal: Normal range of motion. He exhibits edema (2+ pretibial and ankle pitting edema bilaterally).       Right ankle: He exhibits swelling. He exhibits normal range of motion, no ecchymosis, no deformity, no laceration and normal pulse. Tenderness. Achilles tendon normal.       Feet:  TTP to anterior left ankle, peripheral edema, no deformity or bruising noted, moves all toes appropriately, faint distal pulse due to swelling, normal cap refill, sensation intact No calf tenderness, asymmetry, or palpable cord, compartment soft, negative Homan's sign  Neurological: He is alert and oriented to person, place, and time.  Skin: Skin is warm and dry.  Psychiatric: He has a normal mood and affect.    ED Course   Procedures (including critical care time)  Labs Reviewed - No data to display Dg Tibia/fibula Right  02/22/2013   *RADIOLOGY REPORT*  Clinical Data: Swelling  RIGHT TIBIA AND FIBULA - 2 VIEW  Comparison: None.  Findings: There is no acute fracture or dislocation.  Limited views of the right knee and ankle are unremarkable.  No soft tissue abnormalities.  Osseous mineralization is normal.  Incidental note is made of posterior plantar calcaneal spurs.  IMPRESSION:  Normal radiograph of the right leg.  No acute fracture dislocation.   Original Report Authenticated By: Rise Mu, M.D.   1. Peripheral edema   2. Ankle pain, right     MDM   X-ray negative for acute fracture dislocation, incidental finding of posterior plantar calcaneal spurs.  Symptoms possibly due to peripheral edema vs gout vs subacute injury. I doubt DVT.  I have stressed importance of taking lasix daily to prevent further edema.  Rx prednisone and  Percocet. Followup with primary care physician. Discussed plan with patient, he agreed. Return precautions advised.  Garlon Hatchet, PA-C 02/22/13 2322

## 2013-02-22 NOTE — ED Notes (Signed)
Pt comfortable with d/c and f/u instructions. Prescriptions x2. 

## 2013-02-22 NOTE — ED Notes (Signed)
Right lower extremity 2 + pitting edema from toes to knee-- pain in ankle -- states "heard a pop-- but no injury"  Pedal pulse audible with doppler--

## 2013-02-22 NOTE — ED Notes (Signed)
Pt requesting pain medication before discharge. EDPA made aware

## 2013-03-10 ENCOUNTER — Ambulatory Visit (HOSPITAL_COMMUNITY): Payer: No Typology Code available for payment source | Attending: Internal Medicine | Admitting: Radiology

## 2013-03-10 DIAGNOSIS — I2510 Atherosclerotic heart disease of native coronary artery without angina pectoris: Secondary | ICD-10-CM

## 2013-03-10 DIAGNOSIS — E785 Hyperlipidemia, unspecified: Secondary | ICD-10-CM | POA: Insufficient documentation

## 2013-03-10 DIAGNOSIS — I5022 Chronic systolic (congestive) heart failure: Secondary | ICD-10-CM

## 2013-03-10 DIAGNOSIS — I428 Other cardiomyopathies: Secondary | ICD-10-CM | POA: Insufficient documentation

## 2013-03-10 DIAGNOSIS — I509 Heart failure, unspecified: Secondary | ICD-10-CM | POA: Insufficient documentation

## 2013-03-10 DIAGNOSIS — I10 Essential (primary) hypertension: Secondary | ICD-10-CM | POA: Insufficient documentation

## 2013-03-10 NOTE — Progress Notes (Signed)
Echocardiogram performed.  

## 2013-03-13 ENCOUNTER — Ambulatory Visit: Payer: No Typology Code available for payment source | Admitting: Cardiology

## 2013-03-14 ENCOUNTER — Telehealth: Payer: Self-pay | Admitting: Cardiology

## 2013-03-14 NOTE — Telephone Encounter (Signed)
Attempted several times to return patient's call with no answer

## 2013-03-14 NOTE — Telephone Encounter (Signed)
I have attempted numerous times throughout the day and the patient's phone just rings - no answer.  Results were given to patient's family earlier today.

## 2013-03-14 NOTE — Telephone Encounter (Signed)
ECHO results reviewed with patient who verbalized understanding.  Next appointment 10/28 verified with patient.

## 2013-03-14 NOTE — Telephone Encounter (Signed)
Follow up  Pt wants to speak with a nurse regarding his lab results

## 2013-03-14 NOTE — Telephone Encounter (Signed)
F/up   Pt's 3rd time calling for results

## 2013-03-14 NOTE — Telephone Encounter (Signed)
F/ up ° °Pt returning a call about results..  °

## 2013-03-14 NOTE — Telephone Encounter (Signed)
Results reviewed with patient's wife who verbalized understanding.

## 2013-03-26 ENCOUNTER — Other Ambulatory Visit: Payer: Self-pay | Admitting: *Deleted

## 2013-03-26 DIAGNOSIS — I5022 Chronic systolic (congestive) heart failure: Secondary | ICD-10-CM

## 2013-03-26 DIAGNOSIS — E785 Hyperlipidemia, unspecified: Secondary | ICD-10-CM

## 2013-03-26 DIAGNOSIS — N529 Male erectile dysfunction, unspecified: Secondary | ICD-10-CM

## 2013-03-26 DIAGNOSIS — I499 Cardiac arrhythmia, unspecified: Secondary | ICD-10-CM

## 2013-03-26 DIAGNOSIS — N259 Disorder resulting from impaired renal tubular function, unspecified: Secondary | ICD-10-CM

## 2013-03-26 DIAGNOSIS — Z0000 Encounter for general adult medical examination without abnormal findings: Secondary | ICD-10-CM

## 2013-03-26 DIAGNOSIS — E663 Overweight: Secondary | ICD-10-CM

## 2013-03-26 DIAGNOSIS — I10 Essential (primary) hypertension: Secondary | ICD-10-CM

## 2013-03-26 MED ORDER — SIMVASTATIN 40 MG PO TABS: 40.0000 mg | ORAL_TABLET | Freq: Every day | ORAL | Status: DC

## 2013-03-26 MED ORDER — ENALAPRIL MALEATE 20 MG PO TABS: ORAL_TABLET | ORAL | Status: DC

## 2013-03-26 MED ORDER — HYDRALAZINE HCL 50 MG PO TABS: 50.0000 mg | ORAL_TABLET | Freq: Three times a day (TID) | ORAL | Status: DC

## 2013-03-26 MED ORDER — SPIRONOLACTONE 25 MG PO TABS: 25.0000 mg | ORAL_TABLET | Freq: Every day | ORAL | Status: DC

## 2013-03-26 MED ORDER — POTASSIUM CHLORIDE CRYS ER 20 MEQ PO TBCR: 20.0000 meq | EXTENDED_RELEASE_TABLET | Freq: Every day | ORAL | Status: DC

## 2013-03-26 MED ORDER — ISOSORBIDE MONONITRATE ER 60 MG PO TB24: 60.0000 mg | ORAL_TABLET | Freq: Every day | ORAL | Status: DC

## 2013-03-26 MED ORDER — FUROSEMIDE 40 MG PO TABS: ORAL_TABLET | ORAL | Status: DC

## 2013-03-26 MED ORDER — CARVEDILOL 25 MG PO TABS: 25.0000 mg | ORAL_TABLET | Freq: Two times a day (BID) | ORAL | Status: DC

## 2013-04-20 ENCOUNTER — Emergency Department (HOSPITAL_COMMUNITY)
Admission: EM | Admit: 2013-04-20 | Discharge: 2013-04-20 | Disposition: A | Discharge status: Home / Self Care | Payer: Medicaid Other | Attending: Emergency Medicine | Admitting: Emergency Medicine

## 2013-04-20 ENCOUNTER — Encounter (HOSPITAL_COMMUNITY): Payer: Self-pay | Admitting: *Deleted

## 2013-04-20 DIAGNOSIS — Z7982 Long term (current) use of aspirin: Secondary | ICD-10-CM | POA: Insufficient documentation

## 2013-04-20 DIAGNOSIS — M109 Gout, unspecified: Secondary | ICD-10-CM | POA: Insufficient documentation

## 2013-04-20 DIAGNOSIS — I509 Heart failure, unspecified: Secondary | ICD-10-CM | POA: Insufficient documentation

## 2013-04-20 DIAGNOSIS — E669 Obesity, unspecified: Secondary | ICD-10-CM | POA: Insufficient documentation

## 2013-04-20 DIAGNOSIS — I129 Hypertensive chronic kidney disease with stage 1 through stage 4 chronic kidney disease, or unspecified chronic kidney disease: Secondary | ICD-10-CM | POA: Insufficient documentation

## 2013-04-20 DIAGNOSIS — E785 Hyperlipidemia, unspecified: Secondary | ICD-10-CM | POA: Insufficient documentation

## 2013-04-20 DIAGNOSIS — Z79899 Other long term (current) drug therapy: Secondary | ICD-10-CM | POA: Insufficient documentation

## 2013-04-20 DIAGNOSIS — Z9889 Other specified postprocedural states: Secondary | ICD-10-CM | POA: Insufficient documentation

## 2013-04-20 DIAGNOSIS — Z87448 Personal history of other diseases of urinary system: Secondary | ICD-10-CM | POA: Insufficient documentation

## 2013-04-20 DIAGNOSIS — R011 Cardiac murmur, unspecified: Secondary | ICD-10-CM | POA: Insufficient documentation

## 2013-04-20 DIAGNOSIS — M19079 Primary osteoarthritis, unspecified ankle and foot: Secondary | ICD-10-CM | POA: Insufficient documentation

## 2013-04-20 DIAGNOSIS — N189 Chronic kidney disease, unspecified: Secondary | ICD-10-CM | POA: Insufficient documentation

## 2013-04-20 DIAGNOSIS — R000 Tachycardia, unspecified: Secondary | ICD-10-CM | POA: Insufficient documentation

## 2013-04-20 MED ORDER — KETOROLAC TROMETHAMINE 10 MG PO TABS
10.0000 mg | ORAL_TABLET | Freq: Once | ORAL | Status: AC
Start: 2013-04-20 — End: 2013-04-20
  Administered 2013-04-20: 10 mg via ORAL
  Filled 2013-04-20: qty 1

## 2013-04-20 MED ORDER — PREDNISONE 50 MG PO TABS
60.0000 mg | ORAL_TABLET | Freq: Once | ORAL | Status: AC
  Administered 2013-04-20: 60 mg via ORAL
  Filled 2013-04-20 (×2): qty 1

## 2013-04-20 MED ORDER — PREDNISONE 10 MG PO TABS: ORAL_TABLET | ORAL | Status: DC

## 2013-04-20 MED ORDER — OXYCODONE-ACETAMINOPHEN 5-325 MG PO TABS
2.0000 | ORAL_TABLET | Freq: Once | ORAL | Status: AC
  Administered 2013-04-20: 2 via ORAL
  Filled 2013-04-20: qty 2

## 2013-04-20 MED ORDER — OXYCODONE-ACETAMINOPHEN 5-325 MG PO TABS: 1.0000 | ORAL_TABLET | Freq: Four times a day (QID) | ORAL | Status: DC | PRN

## 2013-04-20 NOTE — ED Notes (Signed)
Pt seen and evaluated by EDPa for initial assessment. 

## 2013-04-20 NOTE — ED Notes (Signed)
[  pt c/o  Left ankle pain and swelling that started two days ago, denies any injury, cms intact distal

## 2013-04-20 NOTE — ED Provider Notes (Signed)
Medical screening examination/treatment/procedure(s) were performed by non-physician practitioner and as supervising physician I was immediately available for consultation/collaboration.   Arya Luttrull, MD 04/20/13 1931 

## 2013-04-20 NOTE — ED Provider Notes (Signed)
CSN: [id number]     Arrival date & time 04/20/13  1532 History   First MD Initiated Contact with Patient 04/20/13 1556     Chief Complaint  Patient presents with  . Ankle Pain   (Consider location/radiation/quality/duration/timing/severity/associated sxs/prior Treatment) Patient is a 52 y.o. male presenting with ankle pain. The history is provided by the patient.  Ankle Pain Location:  Ankle Time since incident:  2 days Injury: no   Ankle location:  L ankle Pain details:    Quality:  Aching and throbbing   Radiates to:  Does not radiate   Severity:  Moderate   Onset quality:  Gradual   Duration:  2 days   Timing:  Constant   Progression:  Worsening Chronicity:  Recurrent Dislocation: no   Foreign body present:  No foreign bodies Prior injury to area:  No Relieved by:  Nothing Worsened by:  Bearing weight Associated symptoms: decreased ROM, stiffness and swelling   Associated symptoms: no back pain and no neck pain     Past Medical History  Diagnosis Date  . Hypertension   . Hyperlipidemia   . CHF (congestive heart failure)     EF 50-55%    . Erectile dysfunction   . CKD (chronic kidney disease)   . Obesity   . Gout    Past Surgical History  Procedure Laterality Date  . Colonoscopy    . Salivary gland surgery  09/12/2012  . Cardiac catheterization  2012  . Submandibular gland excision Right 09/12/2012    Procedure: Removal Right Submandibular Larina Bras;  Surgeon: Serena Colonel, MD;  Location: [facility] OR;  Service: ENT;  Laterality: Right;   Family History  Problem Relation Age of Onset  . Hypertension Mother   . Hypertension Father   . Cancer Father     brother died of brain cancer; sister died of bone cancer  . Diabetes Sister   . Diabetes Brother   . Colon cancer Maternal Aunt   . Cardiomyopathy Neg Hx    History  Substance Use Topics  . Smoking status: Never Smoker   . Smokeless tobacco: Never Used  . Alcohol Use: No    Review of Systems  Constitutional:  Negative for activity change.       All ROS Neg except as noted in HPI  HENT: Negative for nosebleeds and neck pain.   Eyes: Negative for photophobia and discharge.  Respiratory: Negative for cough, shortness of breath and wheezing.   Cardiovascular: Negative for chest pain and palpitations.  Gastrointestinal: Negative for abdominal pain and blood in stool.  Genitourinary: Negative for dysuria, frequency and hematuria.  Musculoskeletal: Positive for joint swelling, arthralgias and stiffness. Negative for back pain.  Skin: Negative.   Neurological: Negative for dizziness, seizures and speech difficulty.  Psychiatric/Behavioral: Negative for hallucinations and confusion.    Allergies  Beta adrenergic blockers  Home Medications   Current Outpatient Rx  Name  Route  Sig  Dispense  Refill  . aspirin EC 81 MG tablet   Oral   Take 81 mg by mouth daily.         . carvedilol (COREG) 25 MG tablet   Oral   Take 1 tablet (25 mg total) by mouth 2 (two) times daily with a meal.   60 tablet   6   . colchicine (COLCRYS) 0.6 MG tablet   Oral   Take 0.6 mg by mouth daily.         . enalapril (VASOTEC) 20 MG tablet  Oral   Take 40 mg by mouth daily.         . furosemide (LASIX) 40 MG tablet   Oral   Take 80 mg by mouth 2 (two) times daily.         . hydrALAZINE (APRESOLINE) 50 MG tablet   Oral   Take 1 tablet (50 mg total) by mouth 3 (three) times daily.   90 tablet   6   . isosorbide mononitrate (IMDUR) 60 MG 24 hr tablet   Oral   Take 1 tablet (60 mg total) by mouth daily.   30 tablet   6   . potassium chloride SA (K-DUR,KLOR-CON) 20 MEQ tablet   Oral   Take 1 tablet (20 mEq total) by mouth daily.   30 tablet   6   . simvastatin (ZOCOR) 40 MG tablet   Oral   Take 1 tablet (40 mg total) by mouth at bedtime.   30 tablet   6   . spironolactone (ALDACTONE) 25 MG tablet   Oral   Take 1 tablet (25 mg total) by mouth daily.   30 tablet   6    BP 139/74   Pulse 105  Temp(Src) 98.9 F (37.2 C) (Oral)  Resp 23  Ht 5' 9.5" (1.765 m)  Wt 320 lb (145.151 kg)  BMI 46.59 kg/m2  SpO2 100% Physical Exam  Nursing note and vitals reviewed. Constitutional: He is oriented to person, place, and time. He appears well-developed and well-nourished.  Non-toxic appearance.  HENT:  Head: Normocephalic.  Right Ear: Tympanic membrane and external ear normal.  Left Ear: Tympanic membrane and external ear normal.  Eyes: EOM and lids are normal. Pupils are equal, round, and reactive to light.  Neck: Normal range of motion. Neck supple. Carotid bruit is not present.  Cardiovascular: Regular rhythm and intact distal pulses.  Tachycardia present.  Exam reveals gallop and S4.   Murmur heard.  Systolic murmur is present with a grade of 2/6  Pulses:      Carotid pulses are 0 on the right side, and 0 on the left side. PMI displaced. Occasional to frequent skip beats. PT STATES HE HAS NOT TAKEN HIS HEART MEDICATIONS. NO C/O CHEST PAIN OR SHORTNESS OR BREATH. No rub appreciated.  Pulmonary/Chest: Breath sounds normal. No respiratory distress.  Abdominal: Soft. Bowel sounds are normal. There is no tenderness. There is no guarding.  Musculoskeletal:       Left ankle: He exhibits decreased range of motion and swelling. He exhibits normal pulse. Tenderness.       Left foot: He exhibits tenderness and swelling.  2+ edema of the dorsum of the foot. FROM of the toes.  Lymphadenopathy:       Head (right side): No submandibular adenopathy present.       Head (left side): No submandibular adenopathy present.    He has no cervical adenopathy.  Neurological: He is alert and oriented to person, place, and time. He has normal strength. No cranial nerve deficit or sensory deficit.  Skin: Skin is warm and dry.  Psychiatric: He has a normal mood and affect. His speech is normal.    ED Course  Procedures (including critical care time) Labs Review Labs Reviewed - No data to  display Imaging Review No results found.  MDM  No diagnosis found. **I have reviewed nursing notes, vital signs, and all appropriate lab and imaging results for this patient.*  Pt has hx of gout. He has tried colchicine  for the past 2 days with minimal results. He is on lasix. Exam is consistent with possible gout vs djd. No calf tenderness. Neg Homan's sign.No deformity. Pt has s4 gallop with mild tachycardia of 105. Hx of cardiomyopathy with EF of 35 to 40%. Pt has not taken his lasix nor his heart/blood pressure pills today. Pt advised to take meds on time and daily. Encouraged pt to see his heart MD this week for recheck. Plan for ankle pain - Continue the colchicine, Use prednisone and percocet for pain. Elevate the left ankle as much as possible. See PCP next week for follow up. Return to the ED if any acute changes or problem.  Kathie Dike, PA-C 04/20/13 [phone]

## 2013-05-20 ENCOUNTER — Ambulatory Visit (INDEPENDENT_AMBULATORY_CARE_PROVIDER_SITE_OTHER): Payer: Medicaid Other | Admitting: Cardiology

## 2013-05-20 ENCOUNTER — Encounter: Payer: Self-pay | Admitting: Cardiology

## 2013-05-20 VITALS — BP 112/68 | HR 82 | Ht 69.0 in | Wt 319.0 lb

## 2013-05-20 DIAGNOSIS — M109 Gout, unspecified: Secondary | ICD-10-CM

## 2013-05-20 DIAGNOSIS — I10 Essential (primary) hypertension: Secondary | ICD-10-CM

## 2013-05-20 DIAGNOSIS — I2510 Atherosclerotic heart disease of native coronary artery without angina pectoris: Secondary | ICD-10-CM

## 2013-05-20 DIAGNOSIS — G4733 Obstructive sleep apnea (adult) (pediatric): Secondary | ICD-10-CM

## 2013-05-20 DIAGNOSIS — I509 Heart failure, unspecified: Secondary | ICD-10-CM

## 2013-05-20 DIAGNOSIS — I5020 Unspecified systolic (congestive) heart failure: Secondary | ICD-10-CM

## 2013-05-20 LAB — BASIC METABOLIC PANEL
Chloride: 105 mEq/L (ref 96–112)
Creatinine, Ser: 1.3 mg/dL (ref 0.4–1.5)
Potassium: 3.7 mEq/L (ref 3.5–5.1)
Sodium: 142 mEq/L (ref 135–145)

## 2013-05-20 LAB — BRAIN NATRIURETIC PEPTIDE: Pro B Natriuretic peptide (BNP): 230 pg/mL — ABNORMAL HIGH (ref 0.0–100.0)

## 2013-05-20 NOTE — Progress Notes (Signed)
Patient ID: Eric Murillo, male   DOB: 24-Sep-1960, 52 y.o.   MRN: [id number] PCP: Dr. Dierdre Searles  52 yo with history of obesity, hyperlipidemia, HTN, and nonischemic cardiomyopathy with CHF returns for cardiology followup.  He was diagnosed with severe OSA but has been unable to tolerate CPAP.  His Lasix has been increased to 80 mg bid.  Most recent echo in 8/14 showed improvement in EF to 45%.  He is stable symptomatically.  Weight is up 2 lbs compared to prior appointment in our office. He is short of breath only after walking a long distance.  He gets ankle pain after walking about 100 yards.  No orthopnea or chest pain.   BP has been under control.  He has continued to have problems with gout.    Labs (3/12): K 4.1, creatinine 1.5, LDL 91, HDL 29, LFTs normal, BNP 260, HCT 43 Labs (6/12): K 4, creatinine 1.4, HIV negative, TSH normal, ANA normal, SPEP negative Labs (4/13): LDL 75, HDL 30, K 4.2, creatinine 6.29 Labs (7/13): BNP 356, K 3.9, creatinine 1.5 Labs (1/14): K 3.9, creatinine 1.4, LDL 73, HDL 29 Labs (3/14): BNP 215, K 3.5, creatinine 1.5 Labs (4/14): K 4.2, creatinine 1.35 Labs (5/14): K 3.7, creatinine 1.5  ECG: NSR, 1st degree AV block, low voltage, poor anterior R wave progression  Past Medical History: 1. Nonischemic cardiomyopathy: Echo (3/12) with EF 30-35% and mildly dilated LV, diffuse LV hypokinesis, moderate MR, PA systolic pressure 55 mmHg.  Left and right heart cath (3/12): No angiographic CAD; mean RA 20, PA 76/41, mean PCWP 38, CI 2.1.  ANA, SPEP, and HIV negative.  TSH normal.  Denies drug abuse, heavy ETOH intake.  No family history of cardiomyopathy.  Cardiomyopathy may be due to long-standing HTN.  Echo (6/13): EF 35-40%, moderate LV dilation, moderate to severe LAE, PA systolic pressure 52 mmHg.  Unable to fit in magnet for cardiac MRI.  Echo (8/14) with EF 45%, moderately dilated LV, mild LVH, normal RV, PA systolic pressure 49 mmHg.  2. ERECTILE DYSFUNCTION 3.  HYPERTENSION  4. CKD 5. DYSLIPIDEMIA  6. Obesity 7. Hyperlipidemia 8. Gout 9. OSA: Severe on 6/13 sleep study.  10. Sialolithiasis    Family History: No history of cardiomyopathy No history of cancer among first degree relatives father - died of MI (6s)  mother - HTN  Social History: Lives with mother.  Unemployed, formerly a Film/video editor.  1 sexual partner (male), no contraceptives.  1 adult child. tobacco - none alcohol - none drugs - none  Current Outpatient Prescriptions  Medication Sig Dispense Refill  . aspirin EC 81 MG tablet Take 81 mg by mouth daily.      . carvedilol (COREG) 25 MG tablet Take 1 tablet (25 mg total) by mouth 2 (two) times daily with a meal.  60 tablet  6  . colchicine (COLCRYS) 0.6 MG tablet Take 0.6 mg by mouth daily.      . enalapril (VASOTEC) 20 MG tablet Take 40 mg by mouth daily.      . furosemide (LASIX) 40 MG tablet Take 80 mg by mouth 2 (two) times daily.      . hydrALAZINE (APRESOLINE) 50 MG tablet Take 1 tablet (50 mg total) by mouth 3 (three) times daily.  90 tablet  6  . isosorbide mononitrate (IMDUR) 60 MG 24 hr tablet Take 1 tablet (60 mg total) by mouth daily.  30 tablet  6  . oxyCODONE-acetaminophen (PERCOCET/ROXICET) 5-325 MG per tablet Take 1  tablet by mouth every 6 (six) hours as needed for pain.  20 tablet  0  . potassium chloride SA (K-DUR,KLOR-CON) 20 MEQ tablet Take 1 tablet (20 mEq total) by mouth daily.  30 tablet  6  . predniSONE (DELTASONE) 10 MG tablet 5,4,3,2,1 - take with food  15 tablet  0  . simvastatin (ZOCOR) 40 MG tablet Take 1 tablet (40 mg total) by mouth at bedtime.  30 tablet  6  . spironolactone (ALDACTONE) 25 MG tablet Take 1 tablet (25 mg total) by mouth daily.  30 tablet  6   No current facility-administered medications for this visit.    BP 112/68  Pulse 82  Ht 5\' 9"  (1.753 m)  Wt 144.697 kg (319 lb)  BMI 47.09 kg/m2 General: NAD, obese.  Neck:  Thick, JVP 7 cm, no thyromegaly or thyroid nodule.   Lungs: Clear to auscultation bilaterally with normal respiratory effort. CV: Nondisplaced PMI.  Heart regular S1/S2, +S4, no murmur.  1+ ankle edema.  No carotid bruit.   Abdomen: Soft, nontender, no hepatosplenomegaly, no distention.  Neurologic: Alert and oriented x 3.  Psych: Normal affect. Extremities: No clubbing or cyanosis.   Assessment/Plan:  Systolic heart failure  Nonischemic cardiomyopathy, possibly due to long-standing poorly-controlled HTN. Stable NYHA class II-III symptoms.  EF 45% on last echo (improved).  - Continue current Lasix and watch sodium intake. - Continue current doses of enalapril, Coreg, and spironolactone. - Continue current hydralazine/Imdur.   - BMET today.  RENAL INSUFFICIENCY Check BMET today.  HYPERTENSION  BP control reasonable.  OSA (obstructive sleep apnea)  Severe OSA. He has not tolerated CPAP. Gout Can followup with PCP for allopurinol or Uloric consideration.   [name] 05/20/2013 10:59 PM

## 2013-05-20 NOTE — Patient Instructions (Signed)
Your physician recommends that you have  lab work today--BMET/BNP.   Your physician recommends that you schedule a follow-up appointment in: 3 months with Dr Shirlee Latch in the [facility] on a day that he is there.

## 2013-06-16 ENCOUNTER — Encounter (HOSPITAL_COMMUNITY): Payer: Self-pay | Admitting: Emergency Medicine

## 2013-06-16 ENCOUNTER — Emergency Department (HOSPITAL_COMMUNITY)
Admission: EM | Admit: 2013-06-16 | Discharge: 2013-06-16 | Disposition: A | Discharge status: Home / Self Care | Payer: Medicaid Other | Attending: Emergency Medicine | Admitting: Emergency Medicine

## 2013-06-16 DIAGNOSIS — Z79899 Other long term (current) drug therapy: Secondary | ICD-10-CM | POA: Insufficient documentation

## 2013-06-16 DIAGNOSIS — R0602 Shortness of breath: Secondary | ICD-10-CM | POA: Insufficient documentation

## 2013-06-16 DIAGNOSIS — E669 Obesity, unspecified: Secondary | ICD-10-CM | POA: Insufficient documentation

## 2013-06-16 DIAGNOSIS — M25579 Pain in unspecified ankle and joints of unspecified foot: Secondary | ICD-10-CM | POA: Insufficient documentation

## 2013-06-16 DIAGNOSIS — Z87448 Personal history of other diseases of urinary system: Secondary | ICD-10-CM | POA: Insufficient documentation

## 2013-06-16 DIAGNOSIS — IMO0002 Reserved for concepts with insufficient information to code with codable children: Secondary | ICD-10-CM | POA: Insufficient documentation

## 2013-06-16 DIAGNOSIS — E785 Hyperlipidemia, unspecified: Secondary | ICD-10-CM | POA: Insufficient documentation

## 2013-06-16 DIAGNOSIS — N189 Chronic kidney disease, unspecified: Secondary | ICD-10-CM | POA: Insufficient documentation

## 2013-06-16 DIAGNOSIS — I129 Hypertensive chronic kidney disease with stage 1 through stage 4 chronic kidney disease, or unspecified chronic kidney disease: Secondary | ICD-10-CM | POA: Insufficient documentation

## 2013-06-16 DIAGNOSIS — M109 Gout, unspecified: Secondary | ICD-10-CM | POA: Insufficient documentation

## 2013-06-16 DIAGNOSIS — I509 Heart failure, unspecified: Secondary | ICD-10-CM | POA: Insufficient documentation

## 2013-06-16 DIAGNOSIS — Z95818 Presence of other cardiac implants and grafts: Secondary | ICD-10-CM | POA: Insufficient documentation

## 2013-06-16 DIAGNOSIS — M79672 Pain in left foot: Secondary | ICD-10-CM

## 2013-06-16 DIAGNOSIS — Z7982 Long term (current) use of aspirin: Secondary | ICD-10-CM | POA: Insufficient documentation

## 2013-06-16 MED ORDER — ONDANSETRON HCL 4 MG PO TABS
4.0000 mg | ORAL_TABLET | Freq: Once | ORAL | Status: AC
  Administered 2013-06-16: 4 mg via ORAL
  Filled 2013-06-16: qty 1

## 2013-06-16 MED ORDER — IBUPROFEN 800 MG PO TABS
800.0000 mg | ORAL_TABLET | Freq: Once | ORAL | Status: AC
  Administered 2013-06-16: 800 mg via ORAL
  Filled 2013-06-16: qty 1

## 2013-06-16 MED ORDER — PREDNISONE 10 MG PO TABS: ORAL_TABLET | ORAL | Status: DC

## 2013-06-16 MED ORDER — PREDNISONE 50 MG PO TABS
60.0000 mg | ORAL_TABLET | Freq: Once | ORAL | Status: AC
  Administered 2013-06-16: 60 mg via ORAL
  Filled 2013-06-16 (×2): qty 1

## 2013-06-16 MED ORDER — IBUPROFEN 800 MG PO TABS: 800.0000 mg | ORAL_TABLET | Freq: Three times a day (TID) | ORAL | Status: DC

## 2013-06-16 MED ORDER — HYDROCODONE-ACETAMINOPHEN 5-325 MG PO TABS: 2.0000 | ORAL_TABLET | Freq: Once | ORAL | Status: DC

## 2013-06-16 MED ORDER — HYDROCODONE-ACETAMINOPHEN 7.5-325 MG PO TABS: 1.0000 | ORAL_TABLET | ORAL | Status: DC | PRN

## 2013-06-16 NOTE — ED Notes (Signed)
Pain lt foot, thinks due to gout.

## 2013-06-16 NOTE — ED Provider Notes (Signed)
CSN: [id number]     Arrival date & time 06/16/13  2045 History   First MD Initiated Contact with Patient 06/16/13 2121     Chief Complaint  Patient presents with  . Foot Pain   (Consider location/radiation/quality/duration/timing/severity/associated sxs/prior Treatment) HPI Comments: Patient is a 52 year old male who has a history of gout and arthritis, and presents to the emergency department with complaint of pain to the left ankle. The patient thinks that this may be gout. The patient denies any recent injury. He has been doing more standing and walking than usual. He's not had any fever or or chills related to his foot. He states he has swelling from time to time. Patient has not had any operations or procedures on the left foot. Patient denies being on any blood thinning type medications.  Patient is a 52 y.o. male presenting with lower extremity pain. The history is provided by the patient.  Foot Pain Associated symptoms include arthralgias. Pertinent negatives include no abdominal pain, chest pain, coughing or neck pain.    Past Medical History  Diagnosis Date  . Hypertension   . Hyperlipidemia   . CHF (congestive heart failure)     EF 50-55%    . Erectile dysfunction   . Obesity   . Gout   . CKD (chronic kidney disease)    Past Surgical History  Procedure Laterality Date  . Colonoscopy    . Salivary gland surgery  09/12/2012  . Cardiac catheterization  2012  . Submandibular gland excision Right 09/12/2012    Procedure: Removal Right Submandibular Larina Bras;  Surgeon: Serena Colonel, MD;  Location: [facility] OR;  Service: ENT;  Laterality: Right;   Family History  Problem Relation Age of Onset  . Hypertension Mother   . Hypertension Father   . Cancer Father     brother died of brain cancer; sister died of bone cancer  . Diabetes Sister   . Diabetes Brother   . Colon cancer Maternal Aunt   . Cardiomyopathy Neg Hx    History  Substance Use Topics  . Smoking status: Never Smoker    . Smokeless tobacco: Never Used  . Alcohol Use: No    Review of Systems  Constitutional: Negative for activity change.       All ROS Neg except as noted in HPI  HENT: Negative for nosebleeds.   Eyes: Negative for photophobia and discharge.  Respiratory: Positive for shortness of breath. Negative for cough and wheezing.   Cardiovascular: Negative for chest pain and palpitations.  Gastrointestinal: Negative for abdominal pain and blood in stool.  Genitourinary: Negative for dysuria, frequency and hematuria.  Musculoskeletal: Positive for arthralgias. Negative for back pain and neck pain.  Skin: Negative.   Neurological: Negative for dizziness, seizures and speech difficulty.  Psychiatric/Behavioral: Negative for hallucinations and confusion.    Allergies  Beta adrenergic blockers  Home Medications   Current Outpatient Rx  Name  Route  Sig  Dispense  Refill  . aspirin EC 81 MG tablet   Oral   Take 81 mg by mouth daily.         . carvedilol (COREG) 25 MG tablet   Oral   Take 1 tablet (25 mg total) by mouth 2 (two) times daily with a meal.   60 tablet   6   . colchicine (COLCRYS) 0.6 MG tablet   Oral   Take 0.6 mg by mouth daily.         . enalapril (VASOTEC) 20 MG tablet  Oral   Take 40 mg by mouth daily.         . furosemide (LASIX) 40 MG tablet   Oral   Take 80 mg by mouth 2 (two) times daily.         . hydrALAZINE (APRESOLINE) 50 MG tablet   Oral   Take 1 tablet (50 mg total) by mouth 3 (three) times daily.   90 tablet   6   . isosorbide mononitrate (IMDUR) 60 MG 24 hr tablet   Oral   Take 1 tablet (60 mg total) by mouth daily.   30 tablet   6   . oxyCODONE-acetaminophen (PERCOCET/ROXICET) 5-325 MG per tablet   Oral   Take 1 tablet by mouth every 6 (six) hours as needed for pain.   20 tablet   0   . potassium chloride SA (K-DUR,KLOR-CON) 20 MEQ tablet   Oral   Take 1 tablet (20 mEq total) by mouth daily.   30 tablet   6   . predniSONE  (DELTASONE) 10 MG tablet      5,4,3,2,1 - take with food   15 tablet   0   . simvastatin (ZOCOR) 40 MG tablet   Oral   Take 1 tablet (40 mg total) by mouth at bedtime.   30 tablet   6   . spironolactone (ALDACTONE) 25 MG tablet   Oral   Take 1 tablet (25 mg total) by mouth daily.   30 tablet   6    BP 113/64  Pulse 97  Temp(Src) 99.2 F (37.3 C) (Oral)  Resp 20  Ht 5' 9.5" (1.765 m)  Wt 318 lb (144.244 kg)  BMI 46.30 kg/m2  SpO2 96% Physical Exam  Nursing note and vitals reviewed. Constitutional: He is oriented to person, place, and time. He appears well-developed and well-nourished.  Non-toxic appearance.  HENT:  Head: Normocephalic.  Right Ear: Tympanic membrane and external ear normal.  Left Ear: Tympanic membrane and external ear normal.  Eyes: EOM and lids are normal. Pupils are equal, round, and reactive to light.  Neck: Normal range of motion. Neck supple. Carotid bruit is not present.  Cardiovascular: Normal rate, regular rhythm, normal heart sounds, intact distal pulses and normal pulses.   Pulmonary/Chest: Breath sounds normal. No respiratory distress.  Abdominal: Soft. Bowel sounds are normal. There is no tenderness. There is no guarding.  Musculoskeletal: Normal range of motion.  There is pain to the medial malleolus, Achilles tendon insertion area, and heel. No lesions noted between the toes. There is mild to moderate puffiness of the foot. The dorsalis pedis pulses 2+. There is puffiness of the right and left lower extremity. No pitting edema noted. There is crepitus with flexion and extension of the left knee.  Lymphadenopathy:       Head (right side): No submandibular adenopathy present.       Head (left side): No submandibular adenopathy present.    He has no cervical adenopathy.  Neurological: He is alert and oriented to person, place, and time. He has normal strength. No cranial nerve deficit or sensory deficit.  Skin: Skin is warm and dry.   Psychiatric: He has a normal mood and affect. His speech is normal.    ED Course  Procedures (including critical care time) Labs Review Labs Reviewed - No data to display Imaging Review No results found. Pulse oximetry 96% on room air. Within normal limits by my interpretation. EKG Interpretation   None  MDM  No diagnosis found. **I have reviewed nursing notes, vital signs, and all appropriate lab and imaging results for this patient.,*  Patient has pain of the left foot particularly at the Achilles tendon insertion area. The area is not hot to touch. There's been no recent injury or trauma reported. .For fracture or occult fracture. Patient has history of gout. The plan at this time for the patient to receive a prescription of Norco 7.5 mg, and prednisone  Taper. Pt advised to see Dr Dierdre Searles for office evaluation and management.  Kathie Dike, PA-C 06/16/13 2205

## 2013-06-16 NOTE — ED Provider Notes (Signed)
Medical screening examination/treatment/procedure(s) were performed by non-physician practitioner and as supervising physician I was immediately available for consultation/collaboration.     Eman Rynders, MD 06/16/13 2310 

## 2013-06-16 NOTE — ED Notes (Signed)
Patient is here alone, has crutches and is driving himself.

## 2013-07-02 ENCOUNTER — Other Ambulatory Visit: Payer: Self-pay | Admitting: Internal Medicine

## 2013-07-10 ENCOUNTER — Ambulatory Visit (INDEPENDENT_AMBULATORY_CARE_PROVIDER_SITE_OTHER): Payer: Medicaid Other | Admitting: Internal Medicine

## 2013-07-10 ENCOUNTER — Encounter: Payer: Self-pay | Admitting: Internal Medicine

## 2013-07-10 VITALS — BP 118/71 | HR 78 | Temp 98.2°F | Ht 69.0 in | Wt 324.8 lb

## 2013-07-10 DIAGNOSIS — I10 Essential (primary) hypertension: Secondary | ICD-10-CM

## 2013-07-10 DIAGNOSIS — M109 Gout, unspecified: Secondary | ICD-10-CM

## 2013-07-10 DIAGNOSIS — Z6841 Body Mass Index (BMI) 40.0 and over, adult: Secondary | ICD-10-CM

## 2013-07-10 LAB — GLUCOSE, CAPILLARY: Glucose-Capillary: 106 mg/dL — ABNORMAL HIGH (ref 70–99)

## 2013-07-10 LAB — LIPID PANEL
Cholesterol: 145 mg/dL (ref 0–200)
HDL: 31 mg/dL — ABNORMAL LOW (ref >39)
Total CHOL/HDL Ratio: 4.7 Ratio

## 2013-07-10 LAB — COMPLETE METABOLIC PANEL WITH GFR
BUN: 17 mg/dL (ref 6–23)
CO2: 29 mEq/L (ref 19–32)
Calcium: 9.4 mg/dL (ref 8.4–10.5)
Chloride: 103 mEq/L (ref 96–112)
Creat: 1.27 mg/dL (ref 0.50–1.35)
GFR, Est African American: 75 mL/min
Total Protein: 7.4 g/dL (ref 6.0–8.3)

## 2013-07-10 NOTE — Patient Instructions (Signed)
1. Will see you in 2-3 weeks  2. Plan to start the Gout Prophylaxis treatment next OV 3. Dietary instruction for Gout. Gout Gout is an inflammatory arthritis caused by a buildup of uric acid crystals in the joints. Uric acid is a chemical that is normally present in the blood. When the level of uric acid in the blood is too high it can form crystals that deposit in your joints and tissues. This causes joint redness, soreness, and swelling (inflammation). Repeat attacks are common. Over time, uric acid crystals can form into masses (tophi) near a joint, destroying bone and causing disfigurement. Gout is treatable and often preventable. CAUSES  The disease begins with elevated levels of uric acid in the blood. Uric acid is produced by your body when it breaks down a naturally found substance called purines. Certain foods you eat, such as meats and fish, contain high amounts of purines. Causes of an elevated uric acid level include:  Being passed down from parent to child (heredity).  Diseases that cause increased uric acid production (such as obesity, psoriasis, and certain cancers).  Excessive alcohol use.  Diet, especially diets rich in meat and seafood.  Medicines, including certain cancer-fighting medicines (chemotherapy), water pills (diuretics), and aspirin.  Chronic kidney disease. The kidneys are no longer able to remove uric acid well.  Problems with metabolism. Conditions strongly associated with gout include:  Obesity.  High blood pressure.  High cholesterol.   Diabetes. Not everyone with elevated uric acid levels gets gout. It is not understood why some people get gout and others do not. Surgery, joint injury, and eating too much of certain foods are some of the factors that can lead to gout attacks. SYMPTOMS   An attack of gout comes on quickly. It causes intense pain with redness, swelling, and warmth in a joint.  Fever can occur.  Often, only one joint is involved.  Certain joints are more commonly involved:  Base of the big toe.  Knee.  Ankle.  Wrist.  Finger. Without treatment, an attack usually goes away in a few days to weeks. Between attacks, you usually will not have symptoms, which is different from many other forms of arthritis. DIAGNOSIS  Your caregiver will suspect gout based on your symptoms and exam. In some cases, tests may be recommended. The tests may include:  Blood tests.  Urine tests.  X-rays.  Joint fluid exam. This exam requires a needle to remove fluid from the joint (arthrocentesis). Using a microscope, gout is confirmed when uric acid crystals are seen in the joint fluid. TREATMENT  There are two phases to gout treatment: treating the sudden onset (acute) attack and preventing attacks (prophylaxis).  Treatment of an Acute Attack.  Medicines are used. These include anti-inflammatory medicines or steroid medicines.  An injection of steroid medicine into the affected joint is sometimes necessary.  The painful joint is rested. Movement can worsen the arthritis.  You may use warm or cold treatments on painful joints, depending which works best for you.  Treatment to Prevent Attacks.  If you suffer from frequent gout attacks, your caregiver may advise preventive medicine. These medicines are started after the acute attack subsides. These medicines either help your kidneys eliminate uric acid from your body or decrease your uric acid production. You may need to stay on these medicines for a very long time.  The early phase of treatment with preventive medicine can be associated with an increase in acute gout attacks. For this reason, during  the first few months of treatment, your caregiver may also advise you to take medicines usually used for acute gout treatment. Be sure you understand your caregiver's directions. Your caregiver may make several adjustments to your medicine dose before these medicines are  effective.  Discuss dietary treatment with your caregiver or dietitian. Alcohol and drinks high in sugar and fructose and foods such as meat, poultry, and seafood can increase uric acid levels. Your caregiver or dietician can advise you on drinks and foods that should be limited. HOME CARE INSTRUCTIONS   Do not take aspirin to relieve pain. This raises uric acid levels.  Only take over-the-counter or prescription medicines for pain, discomfort, or fever as directed by your caregiver.  Rest the joint as much as possible. When in bed, keep sheets and blankets off painful areas.  Keep the affected joint raised (elevated).  Apply warm or cold treatments to painful joints. Use of warm or cold treatments depends on which works best for you.  Use crutches if the painful joint is in your leg.  Drink enough fluids to keep your urine clear or pale yellow. This helps your body get rid of uric acid. Limit alcohol, sugary drinks, and fructose drinks.  Follow your dietary instructions. Pay careful attention to the amount of protein you eat. Your daily diet should emphasize fruits, vegetables, whole grains, and fat-free or low-fat milk products. Discuss the use of coffee, vitamin C, and cherries with your caregiver or dietician. These may be helpful in lowering uric acid levels.  Maintain a healthy body weight. SEEK MEDICAL CARE IF:   You develop diarrhea, vomiting, or any side effects from medicines.  You do not feel better in 24 hours, or you are getting worse. SEEK IMMEDIATE MEDICAL CARE IF:   Your joint becomes suddenly more tender, and you have chills or a fever. MAKE SURE YOU:   Understand these instructions.  Will watch your condition.  Will get help right away if you are not doing well or get worse. Document Released: 07/07/2000 Document Revised: 11/04/2012 Document Reviewed: 02/21/2012 [facility] Patient Information 2014 [place], [place].   Purine Restricted Diet A low-purine diet  consists of foods that reduce uric acid made in your body. INDICATIONS FOR USE  Your caregiver may ask you to follow a low-purine diet to reduce gout flairs.  GUIDELINES  Avoid high-purine foods, including all alcohol, yeast extracts taken as supplements, and sauces made from meats (like gravy). Do not eat high-purine meats, including anchovies, sardines, herring, mussels, tuna, codfish, scallops, trout, haddock, bacon, organ meats, tripe, goose, wild game, and sweetbreads.  Grains  Allowed/Recommended: All, except those listed to consume in moderation.  Consume in Moderation: Oatmeal ( cup uncooked daily), wheat bran or germ ( cup daily), and whole grains. Vegetables  Allowed/Recommended: All, except those listed to consume in moderation.  Consume in Moderation: Asparagus, cauliflower, spinach, mushrooms, and green peas ( cup daily). Fruit  Allowed/Recommended: All.  Consume in Moderation: None. Meat and Meat Substitutes  Allowed/Recommended: Eggs, nuts, and peanut butter.  Consume in Moderation: Limit to 4 to 6 oz daily. Avoid high-purine meats. Lentils, peas, and dried beans (1 cup daily). Milk  Allowed/Recommended: All. Choose low-fat or skim when possible.  Consume in Moderation: None. Fats and Oils  Allowed/Recommended: All.  Consume in Moderation: None. Beverages  Allowed/Recommended: All, except those listed to avoid.  Avoid: All alcohol. Condiments/Miscellaneous  Allowed/Recommended: All, except those listed to consume in moderation.  Consume in Moderation: Bouillon and meat-based broths  and soups. Document Released: 11/04/2010 Document Revised: 10/02/2011 Document Reviewed: 11/04/2010 [facility] Patient Information 2014 [place], [place].

## 2013-07-10 NOTE — Progress Notes (Signed)
Patient ID: Eric Murillo, male   DOB: Mar 29, 1961, 52 y.o.   MRN: [id number] Case discussed with Dr. Dierdre Searles at the time of the visit.  We reviewed the resident's history and exam and pertinent patient test results.  I agree with the assessment, diagnosis, and plan of care documented in the resident's note.

## 2013-07-10 NOTE — Progress Notes (Signed)
Subjective:   Patient ID: Eric Murillo male   DOB: June 24, 1961 52 y.o.   MRN: [id number]  HPI: [name] is a 52 y.o. with history of obesity, hyperlipidemia, HTN, severe OSA not tolerate CPAP, and nonischemic cardiomyopathy with CHF followed by [facility] cardiology who presents to the clinic for follow up visit. He states that he has been compliant with all his medications, including ASA, Coreg, lasix, indoor, hydralazine, spirolactone and Zocor.  1. HTN Well controlled on current medications.    2. Gout Patient is noted to have a history of gout but not take any medications.  He adamantly declines any prophylactic treatment in the past.  His Uric acid level was elevated at 9.9 in April 2014. He suffered with Gouty foot pain intermittently, and the recent questionable left foot gouty pain was treated with NSAIDs and prednisone at ED on 06/16/13. He is here for follow up  3. CHF: Well controlled and followed by Dr [name].        No headache, fever, or sore throat. No shortness of breath or dyspnea on exertion. No chest pain, chest pressure or palpitation No nausea, vomiting, or abdominal pain. No melena, diarrhea or incontinence. No muscle weakness. Denies depression. No appetite or weight changes.    l  Past Medical History:  Nonischemic cardiomyopathy: Echo (3/12) with EF 30-35% and mildly dilated LV, diffuse LV hypokinesis, moderate MR, PA systolic pressure 55 mmHg. Left and right heart cath (3/12): No angiographic CAD; mean RA 20, PA 76/41, mean PCWP 38, CI 2.1. ANA and HIV negative. TSH normal. Denies drug abuse, heavy ETOH intake. No family history of cardiomyopathy. Cardiomyopathy may be due to long-standing HTN.     Current Outpatient Prescriptions  Medication Sig Dispense Refill  . aspirin EC 81 MG tablet Take 81 mg by mouth daily.      . carvedilol (COREG) 25 MG tablet Take 1 tablet (25 mg total) by mouth 2 (two) times daily with a meal.  60 tablet  6  . colchicine  (COLCRYS) 0.6 MG tablet Take 0.6 mg by mouth daily.      . enalapril (VASOTEC) 20 MG tablet Take 40 mg by mouth daily.      . furosemide (LASIX) 40 MG tablet Take 80 mg by mouth 2 (two) times daily.      . hydrALAZINE (APRESOLINE) 50 MG tablet Take 1 tablet (50 mg total) by mouth 3 (three) times daily.  90 tablet  6  . HYDROcodone-acetaminophen (NORCO) 7.5-325 MG per tablet Take 1 tablet by mouth every 4 (four) hours as needed for moderate pain.  20 tablet  0  . isosorbide mononitrate (IMDUR) 60 MG 24 hr tablet Take 1 tablet (60 mg total) by mouth daily.  30 tablet  6  . potassium chloride SA (K-DUR,KLOR-CON) 20 MEQ tablet Take 1 tablet (20 mEq total) by mouth daily.  30 tablet  6  . simvastatin (ZOCOR) 40 MG tablet Take 1 tablet (40 mg total) by mouth at bedtime.  30 tablet  6  . spironolactone (ALDACTONE) 25 MG tablet Take 1 tablet (25 mg total) by mouth daily.  30 tablet  6   No current facility-administered medications for this visit.   Family History  Problem Relation Age of Onset  . Hypertension Mother   . Hypertension Father   . Cancer Father     brother died of brain cancer; sister died of bone cancer  . Diabetes Sister   . Diabetes Brother   . Colon  cancer Maternal Aunt   . Cardiomyopathy Neg Hx    History   Social History  . Marital Status: Single    Spouse Name: N/A    Number of Children: N/A  . Years of Education: N/A   Social History Main Topics  . Smoking status: Never Smoker   . Smokeless tobacco: Never Used  . Alcohol Use: No  . Drug Use: No  . Sexual Activity: None   Other Topics Concern  . None   Social History Narrative   Financial assistance approved for 100% discount at [facility] and has [facility] card   [employer]  March 16, 2010 9:42 AM      Lives with mother.  Has a girlfriend   Review of Systems: See HPI  Objective:  Physical Exam: Filed Vitals:   07/10/13 1505  BP: 118/71  Pulse: 78  Temp: 98.2 F (36.8 C)  TempSrc: Oral  Height: 5\' 9"   (1.753 m)  Weight: 324 lb 12.8 oz (147.328 kg)  SpO2: 96%   General: alert, well-developed, and cooperative to examination.  Head: normocephalic and atraumatic.  Eyes: vision grossly intact, pupils equal, pupils round, pupils reactive to light, no injection and anicteric.  Mouth: pharynx pink and moist, no erythema, and no exudates.  Neck: supple, full ROM, no thyromegaly,unable to assess JVD due to body habitus, and no carotid bruits.  Lungs: normal respiratory effort, no accessory muscle use, normal breath sounds, no crackles, and no wheezes. Heart: normal rate, regular rhythm, no murmur, no gallop, and no rub.  Abdomen: soft, non-tender, normal bowel sounds, no distention, no guarding, no rebound tenderness, no hepatomegaly, and no splenomegaly.  Msk: no joint swelling, no joint warmth, and no redness over joints.  Pulses: 2+ DP/PT pulses bilaterally Extremities: No cyanosis, clubbing, 1+ ankle edema Neurologic: alert & oriented X3, cranial nerves II-XII intact, strength normal in all extremities, sensation intact to light touch, and gait normal.  Skin: turgor normal and no rashes.  Psych: Oriented X3, memory intact for recent and remote, normally interactive, good eye contact, not anxious appearing, and not depressed appearing.   Assessment & Plan:

## 2013-07-11 LAB — TSH: TSH: 1.533 u[IU]/mL (ref 0.350–4.500)

## 2013-07-11 NOTE — Assessment & Plan Note (Addendum)
Assessment He has severe obesity and his BMI is> 45.  Plan: -lab--TSH, HGB A1C and FLP.  - Detailed discussion on diet and lifestyle change. - Verbal and written instructions given to the patient -Discussed about weight loss plan-encouraged patient to lose 5% his body weight in 3 month ( 5lbs/months).  Patient is very motivated and will followup closely.

## 2013-07-11 NOTE — Assessment & Plan Note (Addendum)
Assessment: Patient had frequent gout flareups with the recent gout 2 weeks ago. He is symptoms free.  Plan: - Patient has been refusing to take prophylactic treatment in the past despite many discussion with different providers. - Detailed instruction and discussion with patient about the importance of prophylactic treatment. - Verbal and written instructions on low purine diet given to patient.  -Patient agrees think about it and may start the treatment in a couple weeks.

## 2013-07-11 NOTE — Assessment & Plan Note (Signed)
BP Readings from Last 3 Encounters:  07/10/13 118/71  06/16/13 113/64  05/20/13 112/68    Lab Results  Component Value Date   Eric Murillo 141 07/10/2013   K 4.2 07/10/2013   CREATININE 1.27 07/10/2013    Assessment: Blood pressure control: controlled Progress toward BP goal:  at goal   Plan: Medications:  continue current medications Educational resources provided:   Self management tools provided:   Other plans:

## 2013-07-31 ENCOUNTER — Ambulatory Visit (INDEPENDENT_AMBULATORY_CARE_PROVIDER_SITE_OTHER): Payer: Medicaid Other | Admitting: Internal Medicine

## 2013-07-31 ENCOUNTER — Encounter: Payer: Self-pay | Admitting: Internal Medicine

## 2013-07-31 VITALS — BP 117/67 | HR 98 | Temp 97.0°F | Ht 69.0 in | Wt 329.4 lb

## 2013-07-31 DIAGNOSIS — M109 Gout, unspecified: Secondary | ICD-10-CM

## 2013-07-31 DIAGNOSIS — Z6841 Body Mass Index (BMI) 40.0 and over, adult: Secondary | ICD-10-CM

## 2013-07-31 DIAGNOSIS — I10 Essential (primary) hypertension: Secondary | ICD-10-CM

## 2013-07-31 MED ORDER — ALLOPURINOL 100 MG PO TABS: 100.0000 mg | ORAL_TABLET | Freq: Every day | ORAL | Status: DC

## 2013-07-31 MED ORDER — COLCHICINE 0.6 MG PO TABS: 0.6000 mg | ORAL_TABLET | Freq: Every day | ORAL | Status: DC

## 2013-07-31 NOTE — Patient Instructions (Addendum)
1. Please follow up within next two months to discuss your gout prophylaxis treatment. 2. Will give you information on Allopurinol.    Allopurinol tablets What is this medicine? ALLOPURINOL (al oh PURE i nole) reduces the amount of uric acid the body makes. It is used to treat the symptoms of gout. It is also used to treat or prevent high uric acid levels that occur as a result of certain types of chemotherapy. This medicine may also help patients who frequently have kidney stones. This medicine may be used for other purposes; ask your health care provider or pharmacist if you have questions. COMMON BRAND NAME(S): Zyloprim What should I tell my health care provider before I take this medicine? They need to know if you have any of these conditions: -kidney or liver disease -an unusual or allergic reaction to allopurinol, other medicines, foods, dyes, or preservatives -pregnant or trying to get pregnant -breast feeding How should I use this medicine? Take this medicine by mouth with a glass of water. Follow the directions on the prescription label. If this medicine upsets your stomach, take it with food or milk. Take your doses at regular intervals. Do not take your medicine more often than directed. Talk to your pediatrician regarding the use of this medicine in children. Special care may be needed. While this drug may be prescribed for children as young as 6 years for selected conditions, precautions do apply. Overdosage: If you think you have taken too much of this medicine contact a poison control center or emergency room at once. NOTE: This medicine is only for you. Do not share this medicine with others. What if I miss a dose? If you miss a dose, take it as soon as you can. If it is almost time for your next dose, take only that dose. Do not take double or extra doses. What may interact with this medicine? Do not take this medicine with the following medication: -didanosine, ddI This  medicine may also interact with the following medications: -amoxicillin or ampicillin -azathioprine -certain medicines used to treat gout -certain types of diuretics -chlorpropamide -cyclosporine -dicumarol -mercaptopurine -tolbutamide -warfarin This list may not describe all possible interactions. Give your health care provider a list of all the medicines, herbs, non-prescription drugs, or dietary supplements you use. Also tell them if you smoke, drink alcohol, or use illegal drugs. Some items may interact with your medicine. What should I watch for while using this medicine? Visit your doctor or health care professional for regular checks on your progress. If you are taking this medicine to treat gout, you may not have less frequent attacks at first. Keep taking your medicine regularly and the attacks should get better within 2 to 6 weeks. Drink plenty of water (10 to 12 full glasses a day) while you are taking this medicine. This will help to reduce stomach upset and reduce the risk of getting gout or kidney stones. Call your doctor or health care professional at once if you get a skin rash together with chills, fever, sore throat, or nausea and vomiting, if you have blood in your urine, or difficulty passing urine. Do not take vitamin C without asking your doctor or health care professional. Too much vitamin C can increase the chance of getting kidney stones. You may get drowsy or dizzy. Do not drive, use machinery, or do anything that needs mental alertness until you know how this drug affects you. Do not stand or sit up quickly, especially if you are  an older patient. This reduces the risk of dizzy or fainting spells. Alcohol can make you more drowsy and dizzy. Alcohol can also increase the chance of stomach problems and increase the amount of uric acid in your blood. Avoid alcoholic drinks. What side effects may I notice from receiving this medicine? Side effects that you should report to  your doctor or health care professional as soon as possible: -allergic reactions like skin rash, itching or hives, swelling of the face, lips, or tongue -breathing problems -muscle aches or pains -redness, blistering, peeling or loosening of the skin, including inside the mouth Side effects that usually do not require medical attention (report to your doctor or health care professional if they continue or are bothersome): -changes in taste -diarrhea -indigestion -stomach pain or cramps This list may not describe all possible side effects. Call your doctor for medical advice about side effects. You may report side effects to FDA at 1-800-FDA-1088. Where should I keep my medicine? Keep out of the reach of children. Store at room temperature between 15 and 25 degrees C (59 and 77 degrees F). Protect from light and moisture. Throw away any unused medicine after the expiration date. NOTE: This sheet is a summary. It may not cover all possible information. If you have questions about this medicine, talk to your doctor, pharmacist, or health care provider.  2014, Elsevier/Gold Standard. (2008-01-13 14:26:54)  Calorie Counting Diet A calorie counting diet requires you to eat the number of calories that are right for you in a day. Calories are the measurement of how much energy you get from the food you eat. Eating the right amount of calories is important for staying at a healthy weight. If you eat too many calories, your body will store them as fat and you may gain weight. If you eat too few calories, you may lose weight. Counting the number of calories you eat during a day will help you know if you are eating the right amount. A Registered Dietitian can determine how many calories you need in a day. The amount of calories needed varies from person to person. If your goal is to lose weight, you will need to eat fewer calories. Losing weight can benefit you if you are overweight or have health problems  such as heart disease, high blood pressure, or diabetes. If your goal is to gain weight, you will need to eat more calories. Gaining weight may be necessary if you have a certain health problem that causes your body to need more energy. TIPS Whether you are increasing or decreasing the number of calories you eat during a day, it may be hard to get used to changes in what you eat and drink. The following are tips to help you keep track of the number of calories you eat.  Measure foods at home with measuring cups. This helps you know the amount of food and number of calories you are eating.  Restaurants often serve food in amounts that are larger than 1 serving. While eating out, estimate how many servings of a food you are given. For example, a serving of cooked rice is  cup or about the size of half of a fist. Knowing serving sizes will help you be aware of how much food you are eating at restaurants.  Ask for smaller portion sizes or child-size portions at restaurants.  Plan to eat half of a meal at a restaurant. Take the rest home or share the other half with a  friend.  Read the Nutrition Facts panel on food labels for calorie content and serving size. You can find out how many servings are in a package, the size of a serving, and the number of calories each serving has.  For example, a package might contain 3 cookies. The Nutrition Facts panel on that package says that 1 serving is 1 cookie. Below that, it will say there are 3 servings in the container. The calories section of the Nutrition Facts label says there are 90 calories. This means there are 90 calories in 1 cookie (1 serving). If you eat 1 cookie you have eaten 90 calories. If you eat all 3 cookies, you have eaten 270 calories (3 servings x 90 calories = 270 calories). The list below tells you how big or small some common portion sizes are.  1 oz.........4 stacked dice.  3 oz........Marland KitchenDeck of cards.  1 tsp.......Marland KitchenTip of little  finger.  1 tbs......Marland KitchenMarland KitchenThumb.  2 tbs.......Marland KitchenGolf ball.   cup......Marland KitchenHalf of a fist.  1 cup.......Marland KitchenA fist. KEEP A FOOD LOG Write down every food item you eat, the amount you eat, and the number of calories in each food you eat during the day. At the end of the day, you can add up the total number of calories you have eaten. It may help to keep a list like the one below. Find out the calorie information by reading the Nutrition Facts panel on food labels. Breakfast  Bran cereal (1 cup, 110 calories).  Fat-free milk ( cup, 45 calories). Snack  Apple (1 medium, 80 calories). Lunch  Spinach (1 cup, 20 calories).  Tomato ( medium, 20 calories).  Chicken breast strips (3 oz, 165 calories).  Shredded cheddar cheese ( cup, 110 calories).  Light [place] dressing (2 tbs, 60 calories).  Whole-wheat bread (1 slice, 80 calories).  Tub margarine (1 tsp, 35 calories).  Vegetable soup (1 cup, 160 calories). Dinner  Pork chop (3 oz, 190 calories).  Brown rice (1 cup, 215 calories).  Steamed broccoli ( cup, 20 calories).  Strawberries (1  cup, 65 calories).  Whipped cream (1 tbs, 50 calories). Daily Calorie Total: 1425 Document Released: 07/10/2005 Document Revised: 10/02/2011 Document Reviewed: 01/04/2007 [facility] Patient Information 2014 [place], [place].

## 2013-08-01 NOTE — Assessment & Plan Note (Signed)
Assessment:  Patient has had frequent " gout flare-ups", that are usually treated at ED. No positive crystal identified from the joint aspiration in 2013. No other joint aspiration done. Uric acid 9.9 in April 2014.  Based on [place] Criteria: he meets two out of three criteria: Serum UA > 7 and painful joint swelling of abrupt onset and clearing within 1-2 weeks ( per patient report), which has 77% PPV.  Another model to diagnose Gout without joint fluid analysis uses seven variables and scoring values. Male sex (2), History (2), onset within one day (0.5), joint redness(1), 1st MP joint (2.5), Hypertension or CV dx (1.5), Serum UA > 5.88 (3.55).  He scores >12, which puts him at the high probability of gout with a prevalence of 82.5%.  I have discussed with him about coming to the clinic for the evaluation of "gout flare-up" so that we could aspirate his joint. However, it seems that he usually goes to the ED for the treatment.   Given his high PPV and prevalence, I recommended for him to start the Gout Prophylaxis treatment. He has admantly refused it in the past and is hesitate to start it because " I do not want to take more medications".    Plan: 1. Education on risk reduction: Obesity, low purine diet, no Alcohol, HTN and diuretics.  Verbal and written instructions on the diet and weight loss given to patient  Discussed with patient about his current medication regimen, especially with lasix and ACEI which both can increase the risk for gout.                           2. Pharmacologic therapy  Discussed about the prophylaxis treatment and possible side effects from Allopurinol  Patient decide not to start the prophylaxis treatment today.  He states that he prefers not to talk about it during this OV given his recent personal stress. He would go home, think about it and follow up with me in one month.

## 2013-08-01 NOTE — Progress Notes (Signed)
Case discussed with Dr. Li soon after the resident saw the patient. We reviewed the resident's history and exam and pertinent patient test results. I agree with the assessment, diagnosis, and plan of care documented in the resident's note. 

## 2013-08-01 NOTE — Assessment & Plan Note (Signed)
BP Readings from Last 3 Encounters:  07/31/13 117/67  07/10/13 118/71  06/16/13 113/64    Lab Results  Component Value Date   Eric Murillo 141 07/10/2013   K 4.2 07/10/2013   CREATININE 1.27 07/10/2013    Assessment: Blood pressure control: controlled Progress toward BP goal:  at goal   Plan: Medications:  continue current medications Educational resources provided:   Self management tools provided:

## 2013-08-01 NOTE — Assessment & Plan Note (Signed)
Assessment: His TSHm HGB A1C and FLP are non-revealing.  Plan: -continue lifestyle changes as discussed during last OV - will give  [facility] weight loss information - discussed with patient that he would greatly benefit from weight loss.  - Patient agrees with the plan.

## 2013-08-01 NOTE — Progress Notes (Signed)
Subjective:   Patient ID: Eric Murillo male   DOB: 1960-08-15 53 y.o.   MRN: [id number]  HPI: [name] is a 53 y.o. with history of obesity, hyperlipidemia, HTN, severe OSA not tolerate CPAP, and nonischemic cardiomyopathy with CHF followed by [facility] cardiology who presents to the clinic for follow up visit. He states that he has been compliant with all his medications, including ASA, Coreg, lasix, indoor, hydralazine, spirolactone and Zocor.   #. Gout Patient is noted to have a history of gout but not on any prophylaxis medications. No possible crystal study was ever done. He usually goes to the ED for " gout flareup".  His Uric acid level was elevated at 9.9 in April 2014. He adamantly declines any prophylactic treatment in the past.  He suffered with Gouty foot pain intermittently, and the recent questionable left foot gouty pain was treated with NSAIDs and prednisone at ED on 06/16/13. He is here for follow up  #. HTN Well controlled on current medications.   #. CHF: Well controlled and followed by Dr Marca Anconaalton Mclean.   # Morbid obesity, BMI ~47       No headache, fever, or sore throat. No shortness of breath or dyspnea on exertion. No chest pain, chest pressure or palpitation No nausea, vomiting, or abdominal pain. No melena, diarrhea or incontinence. No muscle weakness. Denies depression. No appetite or weight changes.    l  Past Medical History:  Nonischemic cardiomyopathy: Echo (3/12) with EF 30-35% and mildly dilated LV, diffuse LV hypokinesis, moderate MR, PA systolic pressure 55 mmHg. Left and right heart cath (3/12): No angiographic CAD; mean RA 20, PA 76/41, mean PCWP 38, CI 2.1. ANA and HIV negative. TSH normal. Denies drug abuse, heavy ETOH intake. No family history of cardiomyopathy. Cardiomyopathy may be due to long-standing HTN.     Current Outpatient Prescriptions  Medication Sig Dispense Refill  . aspirin EC 81 MG tablet Take 81 mg by mouth daily.      .  carvedilol (COREG) 25 MG tablet Take 1 tablet (25 mg total) by mouth 2 (two) times daily with a meal.  60 tablet  6  . enalapril (VASOTEC) 20 MG tablet Take 40 mg by mouth daily.      . furosemide (LASIX) 40 MG tablet Take 80 mg by mouth 2 (two) times daily.      . hydrALAZINE (APRESOLINE) 50 MG tablet Take 1 tablet (50 mg total) by mouth 3 (three) times daily.  90 tablet  6  . HYDROcodone-acetaminophen (NORCO) 7.5-325 MG per tablet Take 1 tablet by mouth every 4 (four) hours as needed for moderate pain.  20 tablet  0  . isosorbide mononitrate (IMDUR) 60 MG 24 hr tablet Take 1 tablet (60 mg total) by mouth daily.  30 tablet  6  . potassium chloride SA (K-DUR,KLOR-CON) 20 MEQ tablet Take 1 tablet (20 mEq total) by mouth daily.  30 tablet  6  . simvastatin (ZOCOR) 40 MG tablet Take 1 tablet (40 mg total) by mouth at bedtime.  30 tablet  6  . spironolactone (ALDACTONE) 25 MG tablet Take 1 tablet (25 mg total) by mouth daily.  30 tablet  6   No current facility-administered medications for this visit.   Family History  Problem Relation Age of Onset  . Hypertension Mother   . Hypertension Father   . Cancer Father     brother died of brain cancer; sister died of bone cancer  . Diabetes Sister   .  Diabetes Brother   . Colon cancer Maternal Aunt   . Cardiomyopathy Neg Hx    History   Social History  . Marital Status: Single    Spouse Name: N/A    Number of Children: N/A  . Years of Education: N/A   Social History Main Topics  . Smoking status: Never Smoker   . Smokeless tobacco: Never Used  . Alcohol Use: No  . Drug Use: No  . Sexual Activity: None   Other Topics Concern  . None   Social History Narrative   Financial assistance approved for 100% discount at [facility] and has [facility] card   [employer]  March 16, 2010 9:42 AM      Lives with mother.  Has a girlfriend   Review of Systems: See HPI  Objective:  Physical Exam: Filed Vitals:   07/31/13 1410  BP: 117/67  Pulse: 98   Temp: 97 F (36.1 C)  TempSrc: Oral  Height: 5\' 9"  (1.753 m)  Weight: 329 lb 6.4 oz (149.415 kg)  SpO2: 96%   General: NAD Neck: supple, full ROM, no thyromegaly, no JVD.  Lungs: CTA B/L Heart: RRR, No M/G/R Abdomen: soft, non-tender, normal bowel sounds, no distention, no guarding, no rebound tenderness. Msk: no joint warmth, and no redness over joints.   Pulses: 2+ DP/PT pulses bilaterally Extremities: No cyanosis, clubbing. positive trace ankle edema Neurologic: alert & oriented X3.    Assessment & Plan:

## 2013-09-22 ENCOUNTER — Telehealth: Payer: Self-pay | Admitting: *Deleted

## 2013-09-22 ENCOUNTER — Ambulatory Visit (HOSPITAL_COMMUNITY)
Admission: RE | Admit: 2013-09-22 | Discharge: 2013-09-22 | Disposition: A | Discharge status: Home / Self Care | Payer: Medicaid Other | Source: Ambulatory Visit | Attending: Internal Medicine | Admitting: Internal Medicine

## 2013-09-22 VITALS — BP 132/78 | HR 80 | Wt 331.0 lb

## 2013-09-22 DIAGNOSIS — I5020 Unspecified systolic (congestive) heart failure: Secondary | ICD-10-CM

## 2013-09-22 DIAGNOSIS — Z6841 Body Mass Index (BMI) 40.0 and over, adult: Secondary | ICD-10-CM | POA: Insufficient documentation

## 2013-09-22 DIAGNOSIS — I5022 Chronic systolic (congestive) heart failure: Secondary | ICD-10-CM | POA: Insufficient documentation

## 2013-09-22 DIAGNOSIS — I509 Heart failure, unspecified: Secondary | ICD-10-CM

## 2013-09-22 LAB — BASIC METABOLIC PANEL
BUN: 13 mg/dL (ref 6–23)
CO2: 27 meq/L (ref 19–32)
CREATININE: 1.29 mg/dL (ref 0.50–1.35)
Calcium: 9.2 mg/dL (ref 8.4–10.5)
Chloride: 105 mEq/L (ref 96–112)
GFR calc non Af Amer: 62 mL/min — ABNORMAL LOW (ref >90)
GFR, EST AFRICAN AMERICAN: 72 mL/min — AB (ref >90)
GLUCOSE: 97 mg/dL (ref 70–99)
POTASSIUM: 4.6 meq/L (ref 3.7–5.3)
Sodium: 147 mEq/L (ref 137–147)

## 2013-09-22 MED ORDER — TORSEMIDE 20 MG PO TABS: 80.0000 mg | ORAL_TABLET | Freq: Two times a day (BID) | ORAL | Status: DC

## 2013-09-22 NOTE — Progress Notes (Signed)
Patient ID: Blanchard KelchJasper Zarr, male   DOB: 31-Mar-1961, 53 y.o.   MRN: [id number] PCP: Dr. Dierdre SearlesLi  53 yo with history of obesity, hyperlipidemia, HTN, and nonischemic cardiomyopathy with CHF returns for cardiology followup.  He was diagnosed with severe OSA but has been unable to tolerate CPAP.    Most recent echo in 8/14 showed improvement in EF to 45%.  Mr Bascom LevelsFrazier continues to gain weight, he is up 12 lbs.  He follows a high sodium diet and is really eating a lot of fatty foods.  He is short of breath after walking about 100 yards and also gets ankle pain after walking 100 yards.  He sleeps sitting up but has done this for a long time.  No chest pain.    Labs (3/12): K 4.1, creatinine 1.5, LDL 91, HDL 29, LFTs normal, BNP 260, HCT 43 Labs (6/12): K 4, creatinine 1.4, HIV negative, TSH normal, ANA normal, SPEP negative Labs (4/13): LDL 75, HDL 30, K 4.2, creatinine 4.091.57 Labs (7/13): BNP 356, K 3.9, creatinine 1.5 Labs (1/14): K 3.9, creatinine 1.4, LDL 73, HDL 29 Labs (3/14): BNP 215, K 3.5, creatinine 1.5 Labs (4/14): K 4.2, creatinine 1.35 Labs (5/14): K 3.7, creatinine 1.5 Labs (12/14): K 4.2, creatinine 1.27, LDL 84, HDL 31, TSH normal  Past Medical History: 1. Nonischemic cardiomyopathy: Echo (3/12) with EF 30-35% and mildly dilated LV, diffuse LV hypokinesis, moderate MR, PA systolic pressure 55 mmHg.  Left and right heart cath (3/12): No angiographic CAD; mean RA 20, PA 76/41, mean PCWP 38, CI 2.1.  ANA, SPEP, and HIV negative.  TSH normal.  Denies drug abuse, heavy ETOH intake.  No family history of cardiomyopathy.  Cardiomyopathy may be due to long-standing HTN.  Echo (6/13): EF 35-40%, moderate LV dilation, moderate to severe LAE, PA systolic pressure 52 mmHg.  Unable to fit in magnet for cardiac MRI.  Echo (8/14) with EF 45%, moderately dilated LV, mild LVH, normal RV, PA systolic pressure 49 mmHg.  2. ERECTILE DYSFUNCTION 3. HYPERTENSION  4. CKD 5. DYSLIPIDEMIA  6. Obesity 7.  Hyperlipidemia 8. Gout 9. OSA: Severe on 6/13 sleep study.  10. Sialolithiasis    Family History: No history of cardiomyopathy No history of cancer among first degree relatives father - died of MI 37(70s)  mother - HTN  Social History: Lives with mother.  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  1 sexual partner (male), no contraceptives.  1 adult child. tobacco - none alcohol - none drugs - none  ROS: All systems reviewed and negative except as per HPI.   Current Outpatient Prescriptions  Medication Sig Dispense Refill  . aspirin EC 81 MG tablet Take 81 mg by mouth daily.      . carvedilol (COREG) 25 MG tablet Take 1 tablet (25 mg total) by mouth 2 (two) times daily with a meal.  60 tablet  6  . enalapril (VASOTEC) 20 MG tablet Take 40 mg by mouth daily.      . hydrALAZINE (APRESOLINE) 50 MG tablet Take 1 tablet (50 mg total) by mouth 3 (three) times daily.  90 tablet  6  . HYDROcodone-acetaminophen (NORCO) 7.5-325 MG per tablet Take 1 tablet by mouth every 4 (four) hours as needed for moderate pain.  20 tablet  0  . isosorbide mononitrate (IMDUR) 60 MG 24 hr tablet Take 1 tablet (60 mg total) by mouth daily.  30 tablet  6  . potassium chloride SA (K-DUR,KLOR-CON) 20 MEQ tablet Take 1 tablet (20 mEq  total) by mouth daily.  30 tablet  6  . simvastatin (ZOCOR) 40 MG tablet Take 1 tablet (40 mg total) by mouth at bedtime.  30 tablet  6  . spironolactone (ALDACTONE) 25 MG tablet Take 1 tablet (25 mg total) by mouth daily.  30 tablet  6   No current facility-administered medications for this encounter.    BP 132/78  Pulse 80  Wt 331 lb (150.141 kg)  SpO2 96% General: NAD, obese.  Neck:  Thick, JVP 9-10 cm, no thyromegaly or thyroid nodule.  Lungs: Clear to auscultation bilaterally with normal respiratory effort. CV: Nondisplaced PMI.  Heart regular S1/S2, +S4, no murmur.  1+ edema 1/2 to knees bilaterally.  No carotid bruit.   Abdomen: Soft, nontender, no hepatosplenomegaly, no  distention.  Neurologic: Alert and oriented x 3.  Psych: Normal affect. Extremities: No clubbing or cyanosis.   Assessment/Plan:  Systolic heart failure  Nonischemic cardiomyopathy, possibly due to long-standing poorly-controlled HTN.  NYHA class II-III symptoms, looks volume overloaded on exam today.  Weight is up.  EF 45% on last echo (improved).  - Stop Lasix and start torsemide 80 mg daily.  Continue current KCl.  - We talked at length about improving his diet.  He needs to back off on the sodium significantly.  - Continue current doses of enalapril, Coreg, and spironolactone. - Continue current hydralazine/Imdur.   - BMET/BNP today and repeat in 2 wks.  RENAL INSUFFICIENCY Check BMET today.  HYPERTENSION  BP control reasonable.  OSA Severe OSA. He has not tolerated CPAP. Obesity Weight loss is imperative.  We talked at length about portion control, avoiding "junk food" and cutting back on sodium.    [name] 09/22/2013 11:55 AM

## 2013-09-22 NOTE — Telephone Encounter (Signed)
Patient requests aspirin 81mg  samples. Provided for patient.

## 2013-09-22 NOTE — Patient Instructions (Signed)
Discontinue furosemide  Start Torsemide 80 mg twice a day.  Labs today and again in 2 weeks.  Your physician recommends that you schedule a follow-up appointment in: 2 weeks  Do the following things EVERYDAY: 1) Weigh yourself in the morning before breakfast. Write it down and keep it in a log. 2) Take your medicines as prescribed 3) Eat low salt foods-Limit salt (sodium) to 2000 mg per day.  4) Stay as active as you can everyday 5) Limit all fluids for the day to less than 2 liters 6)

## 2013-09-23 ENCOUNTER — Encounter (HOSPITAL_COMMUNITY): Payer: Self-pay | Admitting: Cardiology

## 2013-09-23 ENCOUNTER — Telehealth (HOSPITAL_COMMUNITY): Payer: Self-pay | Admitting: Cardiology

## 2013-09-23 ENCOUNTER — Ambulatory Visit: Payer: Medicaid Other | Admitting: Cardiology

## 2013-09-23 NOTE — Telephone Encounter (Signed)
Message copied by JEFFRIES, Milagros Reap on Tue Sep 23, 2013 11:22 AM ------      Message from: Jacqlyn Krauss      Created: Mon Sep 22, 2013  2:42 PM                   ----- Message -----         From: Laurey Morale, MD         Sent: 09/22/2013   1:31 PM           To: Jacqlyn Krauss, RN            Labs ok ------

## 2013-09-23 NOTE — Telephone Encounter (Signed)
Normal lab letter mailed.

## 2013-09-25 ENCOUNTER — Emergency Department (HOSPITAL_COMMUNITY): Payer: Medicaid Other

## 2013-09-25 ENCOUNTER — Emergency Department (HOSPITAL_COMMUNITY)
Admission: EM | Admit: 2013-09-25 | Discharge: 2013-09-25 | Disposition: A | Discharge status: Home / Self Care | Payer: Medicaid Other | Attending: Emergency Medicine | Admitting: Emergency Medicine

## 2013-09-25 ENCOUNTER — Encounter (HOSPITAL_COMMUNITY): Payer: Self-pay | Admitting: Emergency Medicine

## 2013-09-25 DIAGNOSIS — N189 Chronic kidney disease, unspecified: Secondary | ICD-10-CM | POA: Insufficient documentation

## 2013-09-25 DIAGNOSIS — M19079 Primary osteoarthritis, unspecified ankle and foot: Secondary | ICD-10-CM | POA: Insufficient documentation

## 2013-09-25 DIAGNOSIS — M25579 Pain in unspecified ankle and joints of unspecified foot: Secondary | ICD-10-CM

## 2013-09-25 DIAGNOSIS — E669 Obesity, unspecified: Secondary | ICD-10-CM | POA: Insufficient documentation

## 2013-09-25 DIAGNOSIS — I129 Hypertensive chronic kidney disease with stage 1 through stage 4 chronic kidney disease, or unspecified chronic kidney disease: Secondary | ICD-10-CM | POA: Insufficient documentation

## 2013-09-25 DIAGNOSIS — E785 Hyperlipidemia, unspecified: Secondary | ICD-10-CM | POA: Insufficient documentation

## 2013-09-25 DIAGNOSIS — Z9889 Other specified postprocedural states: Secondary | ICD-10-CM | POA: Insufficient documentation

## 2013-09-25 DIAGNOSIS — Z79899 Other long term (current) drug therapy: Secondary | ICD-10-CM | POA: Insufficient documentation

## 2013-09-25 DIAGNOSIS — I509 Heart failure, unspecified: Secondary | ICD-10-CM | POA: Insufficient documentation

## 2013-09-25 DIAGNOSIS — Z7982 Long term (current) use of aspirin: Secondary | ICD-10-CM | POA: Insufficient documentation

## 2013-09-25 MED ORDER — PREDNISONE 10 MG PO TABS: 20.0000 mg | ORAL_TABLET | Freq: Every day | ORAL | Status: DC

## 2013-09-25 MED ORDER — HYDROCODONE-ACETAMINOPHEN 5-325 MG PO TABS: 1.0000 | ORAL_TABLET | Freq: Four times a day (QID) | ORAL | Status: DC | PRN

## 2013-09-25 MED ORDER — IBUPROFEN 800 MG PO TABS
800.0000 mg | ORAL_TABLET | Freq: Three times a day (TID) | ORAL | Status: DC
Start: 2013-09-25 — End: 2013-11-28

## 2013-09-25 NOTE — ED Notes (Signed)
Pt reporting pain in left ankle.  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  No injury. States pain is the same as the last time he was seen for problem.

## 2013-09-25 NOTE — ED Provider Notes (Signed)
CSN: [id number]     Arrival date & time 09/25/13  1902 History  This chart was scribed for Benny Lennert, MD by Bennett Scrape, ED Scribe. This patient was seen in room APA12/[facility]12 and the patient's care was started at 7:21 PM.  Chief Complaint  Patient presents with  . Ankle Pain     Patient is a 53 y.o. male presenting with ankle pain. The history is provided by the patient. No language interpreter was used.  Ankle Pain Location:  Ankle Injury: no   Ankle location:  L ankle Chronicity:  Recurrent Foreign body present:  No foreign bodies Associated symptoms: no back pain and no fatigue   Risk factors comment:  Previosuly dx arthritis to same ankle November 2014   HPI Comments: Eric Murillo is a 53 y.o. male who presents to the Emergency Department complaining of recurrent pain in the left ankle. He reports that he had similar pain back in November 2014 that was diagnosed as arthritis. He was given prednisone, ibuprofen 800 mg and hydrocodone with moderate improvement. He denies any new injuries or other complaints.   Past Medical History  Diagnosis Date  . Hypertension   . Hyperlipidemia   . CHF (congestive heart failure)     EF 50-55%    . Erectile dysfunction   . Obesity   . Gout   . CKD (chronic kidney disease)    Past Surgical History  Procedure Laterality Date  . Colonoscopy    . Salivary gland surgery  09/12/2012  . Cardiac catheterization  2012  . Submandibular gland excision Right 09/12/2012    Procedure: Removal Right Submandibular Larina Bras;  Surgeon: Serena Colonel, MD;  Location: [facility] OR;  Service: ENT;  Laterality: Right;   Family History  Problem Relation Age of Onset  . Hypertension Mother   . Hypertension Father   . Cancer Father     brother died of brain cancer; sister died of bone cancer  . Diabetes Sister   . Diabetes Brother   . Colon cancer Maternal Aunt   . Cardiomyopathy Neg Hx    History  Substance Use Topics  . Smoking status: Never Smoker    . Smokeless tobacco: Never Used  . Alcohol Use: No    Review of Systems  Constitutional: Negative for appetite change and fatigue.  HENT: Negative for congestion, ear discharge and sinus pressure.   Eyes: Negative for discharge.  Respiratory: Negative for cough.   Cardiovascular: Negative for chest pain.  Gastrointestinal: Negative for abdominal pain and diarrhea.  Genitourinary: Negative for frequency and hematuria.  Musculoskeletal: Positive for arthralgias (left ankle). Negative for back pain.  Skin: Negative for rash.  Neurological: Negative for seizures and headaches.  Psychiatric/Behavioral: Negative for hallucinations.      Allergies  Beta adrenergic blockers  Home Medications   Current Outpatient Rx  Name  Route  Sig  Dispense  Refill  . aspirin EC 81 MG tablet   Oral   Take 81 mg by mouth daily.         . carvedilol (COREG) 25 MG tablet   Oral   Take 1 tablet (25 mg total) by mouth 2 (two) times daily with a meal.   60 tablet   6   . enalapril (VASOTEC) 20 MG tablet   Oral   Take 40 mg by mouth daily.         . hydrALAZINE (APRESOLINE) 50 MG tablet   Oral   Take 1 tablet (50 mg total)  by mouth 3 (three) times daily.   90 tablet   6   . HYDROcodone-acetaminophen (NORCO) 7.5-325 MG per tablet   Oral   Take 1 tablet by mouth every 4 (four) hours as needed for moderate pain.   20 tablet   0   . isosorbide mononitrate (IMDUR) 60 MG 24 hr tablet   Oral   Take 1 tablet (60 mg total) by mouth daily.   30 tablet   6   . potassium chloride SA (K-DUR,KLOR-CON) 20 MEQ tablet   Oral   Take 1 tablet (20 mEq total) by mouth daily.   30 tablet   6   . simvastatin (ZOCOR) 40 MG tablet   Oral   Take 1 tablet (40 mg total) by mouth at bedtime.   30 tablet   6   . spironolactone (ALDACTONE) 25 MG tablet   Oral   Take 1 tablet (25 mg total) by mouth daily.   30 tablet   6   . torsemide (DEMADEX) 20 MG tablet   Oral   Take 4 tablets (80 mg  total) by mouth 2 (two) times daily.   240 tablet   3    Triage Vitals: BP 125/70  Pulse 79  Temp(Src) 98.8 F (37.1 C) (Oral)  Resp 18  Ht 5\' 9"  (1.753 m)  Wt 331 lb (150.141 kg)  BMI 48.86 kg/m2  SpO2 97%  Physical Exam  Nursing note and vitals reviewed. Constitutional: He is oriented to person, place, and time. He appears well-developed and well-nourished.  HENT:  Head: Normocephalic and atraumatic.  Eyes: Conjunctivae are normal.  Neck: No tracheal deviation present.  Cardiovascular: Normal rate and regular rhythm.   No murmur heard. Pulmonary/Chest: No respiratory distress.  Musculoskeletal: Normal range of motion.  Mild tenderness to the posterior left ankle, NVI, achilles intact  Neurological: He is alert and oriented to person, place, and time.  Skin: Skin is warm and dry.  Psychiatric: He has a normal mood and affect. His behavior is normal.    ED Course  Procedures (including critical care time)  DIAGNOSTIC STUDIES: Oxygen Saturation is 97% on RA, adequate by my interpretation.    COORDINATION OF CARE: 7:27 PM-Discussed treatment plan which includes  (CXR, CBC panel, CMP, UA) with pt at bedside and pt agreed to plan.   Labs Review Labs Reviewed - No data to display Imaging Review No results found.   EKG Interpretation None      MDM   Final diagnoses:  None    Ankle pain   The chart was scribed for me under my direct supervision.  I personally performed the history, physical, and medical decision making and all procedures in the evaluation of this patient.Benny Lennert.      Ramsey Guadamuz L Ugochi Henzler, MD 09/25/13 [phone]

## 2013-09-25 NOTE — Discharge Instructions (Signed)
Follow up with dr. Hilda Lias or your family md next week to check ankle

## 2013-09-25 NOTE — ED Notes (Signed)
Pt verbalized understanding of no driving within 4 hours of taking vicodin due to med may cause drowsiness  

## 2013-10-05 ENCOUNTER — Other Ambulatory Visit: Payer: Self-pay | Admitting: Cardiology

## 2013-10-07 ENCOUNTER — Ambulatory Visit (HOSPITAL_COMMUNITY)
Admission: RE | Admit: 2013-10-07 | Discharge: 2013-10-07 | Disposition: A | Discharge status: Home / Self Care | Payer: Medicaid Other | Source: Ambulatory Visit | Attending: Internal Medicine | Admitting: Internal Medicine

## 2013-10-07 ENCOUNTER — Other Ambulatory Visit (HOSPITAL_COMMUNITY): Payer: Self-pay | Admitting: *Deleted

## 2013-10-07 ENCOUNTER — Encounter (HOSPITAL_COMMUNITY): Payer: Self-pay

## 2013-10-07 VITALS — BP 120/78 | HR 88 | Wt 317.8 lb

## 2013-10-07 DIAGNOSIS — N529 Male erectile dysfunction, unspecified: Secondary | ICD-10-CM

## 2013-10-07 DIAGNOSIS — Z0000 Encounter for general adult medical examination without abnormal findings: Secondary | ICD-10-CM

## 2013-10-07 DIAGNOSIS — I509 Heart failure, unspecified: Secondary | ICD-10-CM | POA: Insufficient documentation

## 2013-10-07 DIAGNOSIS — E785 Hyperlipidemia, unspecified: Secondary | ICD-10-CM

## 2013-10-07 DIAGNOSIS — E7800 Pure hypercholesterolemia, unspecified: Secondary | ICD-10-CM | POA: Insufficient documentation

## 2013-10-07 DIAGNOSIS — E663 Overweight: Secondary | ICD-10-CM

## 2013-10-07 DIAGNOSIS — I499 Cardiac arrhythmia, unspecified: Secondary | ICD-10-CM

## 2013-10-07 DIAGNOSIS — I5022 Chronic systolic (congestive) heart failure: Secondary | ICD-10-CM

## 2013-10-07 DIAGNOSIS — N259 Disorder resulting from impaired renal tubular function, unspecified: Secondary | ICD-10-CM

## 2013-10-07 DIAGNOSIS — I10 Essential (primary) hypertension: Secondary | ICD-10-CM | POA: Insufficient documentation

## 2013-10-07 LAB — BASIC METABOLIC PANEL
BUN: 24 mg/dL — AB (ref 6–23)
CO2: 31 mEq/L (ref 19–32)
Calcium: 9.5 mg/dL (ref 8.4–10.5)
Chloride: 98 mEq/L (ref 96–112)
Creatinine, Ser: 1.44 mg/dL — ABNORMAL HIGH (ref 0.50–1.35)
GFR, EST AFRICAN AMERICAN: 63 mL/min — AB (ref >90)
GFR, EST NON AFRICAN AMERICAN: 54 mL/min — AB (ref >90)
Glucose, Bld: 97 mg/dL (ref 70–99)
Potassium: 3.9 mEq/L (ref 3.7–5.3)
Sodium: 144 mEq/L (ref 137–147)

## 2013-10-07 LAB — PRO B NATRIURETIC PEPTIDE: Pro B Natriuretic peptide (BNP): 629.1 pg/mL — ABNORMAL HIGH (ref 0–125)

## 2013-10-07 MED ORDER — POTASSIUM CHLORIDE CRYS ER 20 MEQ PO TBCR: 40.0000 meq | EXTENDED_RELEASE_TABLET | Freq: Every day | ORAL | Status: DC

## 2013-10-07 MED ORDER — ENALAPRIL MALEATE 20 MG PO TABS: 40.0000 mg | ORAL_TABLET | Freq: Every day | ORAL | Status: DC

## 2013-10-07 MED ORDER — SPIRONOLACTONE 25 MG PO TABS: 25.0000 mg | ORAL_TABLET | Freq: Every day | ORAL | Status: DC

## 2013-10-07 MED ORDER — HYDRALAZINE HCL 50 MG PO TABS: 50.0000 mg | ORAL_TABLET | Freq: Three times a day (TID) | ORAL | Status: DC

## 2013-10-07 MED ORDER — CARVEDILOL 25 MG PO TABS: 25.0000 mg | ORAL_TABLET | Freq: Two times a day (BID) | ORAL | Status: DC

## 2013-10-07 MED ORDER — TORSEMIDE 20 MG PO TABS: ORAL_TABLET | ORAL | Status: DC

## 2013-10-07 MED ORDER — ISOSORBIDE MONONITRATE ER 60 MG PO TB24
60.0000 mg | ORAL_TABLET | Freq: Every day | ORAL | Status: DC
Start: 2013-10-07 — End: 2014-01-05

## 2013-10-07 MED ORDER — SIMVASTATIN 40 MG PO TABS: 40.0000 mg | ORAL_TABLET | Freq: Every day | ORAL | Status: DC

## 2013-10-07 NOTE — Patient Instructions (Signed)
Decrease Torsemide to 80 mg in AM and 40 mg in PM  Increase Potassium to 40 meq (2 tabs) daily  Labs today  Your physician recommends that you schedule a follow-up appointment in: 1 month

## 2013-10-08 NOTE — Progress Notes (Signed)
Patient ID: Yassin Kravets, male   DOB: [birth year], 53 y.o.   MRN: [id number] PCP: Dr. Dierdre Searles  53 yo with history of obesity, hyperlipidemia, HTN, and nonischemic cardiomyopathy with CHF returns for cardiology followup.  He was diagnosed with severe OSA but has been unable to tolerate CPAP.    Most recent echo in 8/14 showed improvement in EF to 45%.  At last appoint, Mr Proia weight was up considerably and he was short of breath after walking a relatively short distance.  I asked him to stop Lasix and start torsemide 80 mg daily.  He actually started taking torsemide 80 mg bid.  Since then, he has lost 14 lbs.  He has run out of potassium and is getting cramps.  He is breathing better and can walk on flat ground now without problems.   Labs (3/12): K 4.1, creatinine 1.5, LDL 91, HDL 29, LFTs normal, BNP 260, HCT 43 Labs (6/12): K 4, creatinine 1.4, HIV negative, TSH normal, ANA normal, SPEP negative Labs (4/13): LDL 75, HDL 30, K 4.2, creatinine 8.33 Labs (7/13): BNP 356, K 3.9, creatinine 1.5 Labs (1/14): K 3.9, creatinine 1.4, LDL 73, HDL 29 Labs (3/14): BNP 215, K 3.5, creatinine 1.5 Labs (4/14): K 4.2, creatinine 1.35 Labs (5/14): K 3.7, creatinine 1.5 Labs (12/14): K 4.2, creatinine 1.27, LDL 84, HDL 31, TSH normal Labs (3/15): K 4.6, creatinine 1.29  Past Medical History: 1. Nonischemic cardiomyopathy: Echo (3/12) with EF 30-35% and mildly dilated LV, diffuse LV hypokinesis, moderate MR, PA systolic pressure 55 mmHg.  Left and right heart cath (3/12): No angiographic CAD; mean RA 20, PA 76/41, mean PCWP 38, CI 2.1.  ANA, SPEP, and HIV negative.  TSH normal.  Denies drug abuse, heavy ETOH intake.  No family history of cardiomyopathy.  Cardiomyopathy may be due to long-standing HTN.  Echo (6/13): EF 35-40%, moderate LV dilation, moderate to severe LAE, PA systolic pressure 52 mmHg.  Unable to fit in magnet for cardiac MRI.  Echo (8/14) with EF 45%, moderately dilated LV, mild LVH, normal RV, PA  systolic pressure 49 mmHg.  2. ERECTILE DYSFUNCTION 3. HYPERTENSION  4. CKD 5. DYSLIPIDEMIA  6. Obesity 7. Hyperlipidemia 8. Gout 9. OSA: Severe on 6/13 sleep study.  10. Sialolithiasis    Family History: No history of cardiomyopathy No history of cancer among first degree relatives father - died of MI (24s)  mother - HTN  Social History: Lives with mother.  Unemployed, formerly a Film/video editor.  1 sexual partner (male), no contraceptives.  1 adult child. tobacco - none alcohol - none drugs - none  ROS: All systems reviewed and negative except as per HPI.   Current Outpatient Prescriptions  Medication Sig Dispense Refill  . aspirin EC 81 MG tablet Take 81 mg by mouth daily.      . carvedilol (COREG) 25 MG tablet Take 1 tablet (25 mg total) by mouth 2 (two) times daily with a meal.  60 tablet  6  . enalapril (VASOTEC) 20 MG tablet TAKE TWO TABLETS BY MOUTH ONCE DAILY  60 tablet  6  . hydrALAZINE (APRESOLINE) 50 MG tablet Take 1 tablet (50 mg total) by mouth 3 (three) times daily.  90 tablet  6  . HYDROcodone-acetaminophen (NORCO/VICODIN) 5-325 MG per tablet Take 1 tablet by mouth every 6 (six) hours as needed for moderate pain.  20 tablet  0  . ibuprofen (ADVIL,MOTRIN) 800 MG tablet Take 1 tablet (800 mg total) by mouth 3 (three) times daily.  21 tablet  0  . isosorbide mononitrate (IMDUR) 60 MG 24 hr tablet Take 1 tablet (60 mg total) by mouth daily.  30 tablet  6  . potassium chloride SA (K-DUR,KLOR-CON) 20 MEQ tablet Take 2 tablets (40 mEq total) by mouth daily.  60 tablet  6  . predniSONE (DELTASONE) 10 MG tablet Take 2 tablets (20 mg total) by mouth daily.  14 tablet  0  . simvastatin (ZOCOR) 40 MG tablet Take 1 tablet (40 mg total) by mouth at bedtime.  30 tablet  6  . spironolactone (ALDACTONE) 25 MG tablet Take 1 tablet (25 mg total) by mouth daily.  30 tablet  6  . torsemide (DEMADEX) 20 MG tablet Take 4 tabs (80 mg) in AM and 40 mg (2 tabs) in PM  180 tablet  3   No  current facility-administered medications for this encounter.    BP 120/78  Pulse 88  Wt 317 lb 12.8 oz (144.153 kg)  SpO2 95% General: NAD, obese.  Neck:  Thick, JVP 7 cm, no thyromegaly or thyroid nodule.  Lungs: Clear to auscultation bilaterally with normal respiratory effort. CV: Nondisplaced PMI.  Heart regular S1/S2, +S4, no murmur.  1+ edema at ankles bilaterally.  No carotid bruit.   Abdomen: Soft, nontender, no hepatosplenomegaly, no distention.  Neurologic: Alert and oriented x 3.  Psych: Normal affect. Extremities: No clubbing or cyanosis.   Assessment/Plan:  Systolic heart failure  Nonischemic cardiomyopathy, possibly due to long-standing poorly-controlled HTN. EF 45% on last echo (improved). NYHA class II symptoms now.  Weight is down after taking torsemide 80 mg bid (had wanted him to take torsemide 80 daily).  Cramping is likely related to hypokalemia.  - Decrease torsemide to 80 qam, 40 qpm.  - Refill KCl and increase to 40 mEq daily.  - Needs BMET/BNP today.  - Continue current doses of enalapril, Coreg, and spironolactone. - Continue current hydralazine/Imdur.   RENAL INSUFFICIENCY Check BMET today.  HYPERTENSION  BP control reasonable.  OSA Severe OSA. He has not tolerated CPAP. Obesity Weight loss is imperative.  We talked at length about portion control, avoiding "junk food" and cutting back on sodium.    Marca AnconaDalton Honor Fairbank 10/08/2013 7:46 AM

## 2013-10-18 ENCOUNTER — Encounter (HOSPITAL_COMMUNITY): Payer: Self-pay | Admitting: Emergency Medicine

## 2013-10-18 ENCOUNTER — Emergency Department (HOSPITAL_COMMUNITY): Payer: Medicaid Other

## 2013-10-18 ENCOUNTER — Emergency Department (HOSPITAL_COMMUNITY)
Admission: EM | Admit: 2013-10-18 | Discharge: 2013-10-18 | Disposition: A | Discharge status: Home / Self Care | Payer: Medicaid Other | Attending: Emergency Medicine | Admitting: Emergency Medicine

## 2013-10-18 DIAGNOSIS — I129 Hypertensive chronic kidney disease with stage 1 through stage 4 chronic kidney disease, or unspecified chronic kidney disease: Secondary | ICD-10-CM | POA: Insufficient documentation

## 2013-10-18 DIAGNOSIS — R51 Headache: Secondary | ICD-10-CM | POA: Insufficient documentation

## 2013-10-18 DIAGNOSIS — I509 Heart failure, unspecified: Secondary | ICD-10-CM | POA: Insufficient documentation

## 2013-10-18 DIAGNOSIS — R519 Headache, unspecified: Secondary | ICD-10-CM

## 2013-10-18 DIAGNOSIS — IMO0002 Reserved for concepts with insufficient information to code with codable children: Secondary | ICD-10-CM | POA: Insufficient documentation

## 2013-10-18 DIAGNOSIS — Z79899 Other long term (current) drug therapy: Secondary | ICD-10-CM | POA: Insufficient documentation

## 2013-10-18 DIAGNOSIS — Z87448 Personal history of other diseases of urinary system: Secondary | ICD-10-CM | POA: Insufficient documentation

## 2013-10-18 DIAGNOSIS — N189 Chronic kidney disease, unspecified: Secondary | ICD-10-CM | POA: Insufficient documentation

## 2013-10-18 DIAGNOSIS — M109 Gout, unspecified: Secondary | ICD-10-CM | POA: Insufficient documentation

## 2013-10-18 DIAGNOSIS — E669 Obesity, unspecified: Secondary | ICD-10-CM | POA: Insufficient documentation

## 2013-10-18 DIAGNOSIS — E785 Hyperlipidemia, unspecified: Secondary | ICD-10-CM | POA: Insufficient documentation

## 2013-10-18 DIAGNOSIS — I2510 Atherosclerotic heart disease of native coronary artery without angina pectoris: Secondary | ICD-10-CM | POA: Insufficient documentation

## 2013-10-18 DIAGNOSIS — Z7982 Long term (current) use of aspirin: Secondary | ICD-10-CM | POA: Insufficient documentation

## 2013-10-18 DIAGNOSIS — Z791 Long term (current) use of non-steroidal anti-inflammatories (NSAID): Secondary | ICD-10-CM | POA: Insufficient documentation

## 2013-10-18 MED ORDER — ACETAMINOPHEN 325 MG PO TABS
650.0000 mg | ORAL_TABLET | Freq: Once | ORAL | Status: AC
  Administered 2013-10-18: 650 mg via ORAL
  Filled 2013-10-18: qty 2

## 2013-10-18 NOTE — ED Notes (Signed)
Pt reports soreness to top left side of head that started 5 days ago. Pt rates pain 4/10. Pt denies any injury to area or fall. Pt denies taking any medications for pain.

## 2013-10-18 NOTE — ED Provider Notes (Signed)
CSN: [id number]     Arrival date & time 10/18/13  0019 History   First MD Initiated Contact with Patient 10/18/13 0110     Chief Complaint  Patient presents with  . Headache     (Consider location/radiation/quality/duration/timing/severity/associated sxs/prior Treatment) HPI Comments: 53 year old male with high blood pressure history renal insufficiency coronary artery disease, out presents with left-sided headache. Patient has had worsening pain to left for head, worse with palpation however also a deep sensation to it. History of similar. Patient does feels a knot sensation at times but does not recall any injury. No fevers or rash noted. No temporal tenderness. No neck stiffness.  Patient is a 53 y.o. male presenting with headaches. The history is provided by the patient.  Headache Associated symptoms: no abdominal pain, no back pain, no congestion, no fever, no neck pain, no neck stiffness and no vomiting     Past Medical History  Diagnosis Date  . Hypertension   . Hyperlipidemia   . CHF (congestive heart failure)     EF 50-55%    . Erectile dysfunction   . Obesity   . Gout   . CKD (chronic kidney disease)    Past Surgical History  Procedure Laterality Date  . Colonoscopy    . Salivary gland surgery  09/12/2012  . Cardiac catheterization  2012  . Submandibular gland excision Right 09/12/2012    Procedure: Removal Right Submandibular Larina Bras;  Surgeon: Serena Colonel, MD;  Location: [facility] OR;  Service: ENT;  Laterality: Right;   Family History  Problem Relation Age of Onset  . Hypertension Mother   . Hypertension Father   . Cancer Father     brother died of brain cancer; sister died of bone cancer  . Diabetes Sister   . Diabetes Brother   . Colon cancer Maternal Aunt   . Cardiomyopathy Neg Hx    History  Substance Use Topics  . Smoking status: Never Smoker   . Smokeless tobacco: Never Used  . Alcohol Use: No    Review of Systems  Constitutional: Negative for fever and  chills.  HENT: Negative for congestion.   Eyes: Negative for visual disturbance.  Respiratory: Negative for shortness of breath.   Cardiovascular: Negative for chest pain.  Gastrointestinal: Negative for vomiting and abdominal pain.  Genitourinary: Negative for dysuria and flank pain.  Musculoskeletal: Negative for back pain, neck pain and neck stiffness.  Skin: Negative for rash.  Neurological: Positive for headaches. Negative for light-headedness.      Allergies  Beta adrenergic blockers  Home Medications   Current Outpatient Rx  Name  Route  Sig  Dispense  Refill  . aspirin EC 81 MG tablet   Oral   Take 81 mg by mouth daily.         . carvedilol (COREG) 25 MG tablet   Oral   Take 1 tablet (25 mg total) by mouth 2 (two) times daily with a meal.   60 tablet   6   . enalapril (VASOTEC) 20 MG tablet   Oral   Take 40 mg by mouth daily.         . hydrALAZINE (APRESOLINE) 50 MG tablet   Oral   Take 1 tablet (50 mg total) by mouth 3 (three) times daily.   90 tablet   6   . ibuprofen (ADVIL,MOTRIN) 800 MG tablet   Oral   Take 1 tablet (800 mg total) by mouth 3 (three) times daily.   21 tablet  0   . isosorbide mononitrate (IMDUR) 60 MG 24 hr tablet   Oral   Take 1 tablet (60 mg total) by mouth daily.   30 tablet   6   . potassium chloride SA (K-DUR,KLOR-CON) 20 MEQ tablet   Oral   Take 2 tablets (40 mEq total) by mouth daily.   60 tablet   6   . predniSONE (DELTASONE) 10 MG tablet   Oral   Take 2 tablets (20 mg total) by mouth daily.   14 tablet   0   . simvastatin (ZOCOR) 40 MG tablet   Oral   Take 1 tablet (40 mg total) by mouth at bedtime.   30 tablet   6   . spironolactone (ALDACTONE) 25 MG tablet   Oral   Take 1 tablet (25 mg total) by mouth daily.   30 tablet   6   . torsemide (DEMADEX) 20 MG tablet   Oral   Take 40-80 mg by mouth 2 (two) times daily. Take 4 tablets every morning and take 2 tablet every evening          BP  126/76  Pulse 96  Temp(Src) 98.4 F (36.9 C) (Oral)  Resp 18  SpO2 100% Physical Exam  Nursing note and vitals reviewed. Constitutional: He is oriented to person, place, and time. He appears well-developed and well-nourished.  HENT:  Head: Normocephalic and atraumatic.  Eyes: Conjunctivae are normal. Right eye exhibits no discharge. Left eye exhibits no discharge.  Neck: Normal range of motion. Neck supple. No tracheal deviation present.  Cardiovascular: Normal rate.   Pulmonary/Chest: Effort normal.  Musculoskeletal: He exhibits tenderness (mild left upper forehead, no temple tenderness). He exhibits no edema.  Neurological: He is alert and oriented to person, place, and time. GCS eye subscore is 4. GCS verbal subscore is 5. GCS motor subscore is 6.  5+ strength in UE and LE with f/e at major joints. Sensation to palpation intact in UE and LE. CNs 2-12 grossly intact.  EOMFI.  PERRL.   Finger nose and coordination intact bilateral.   Visual fields intact to finger testing.   Skin: Skin is warm. No rash noted.  No abscess or sign of infection at site  Psychiatric: He has a normal mood and affect.    ED Course  Procedures (including critical care time) Labs Review Labs Reviewed - No data to display Imaging Review Ct Head Wo Contrast  10/18/2013   CLINICAL DATA:  Left-sided headache x5 days  EXAM: CT HEAD WITHOUT CONTRAST  TECHNIQUE: Contiguous axial images were obtained from the base of the skull through the vertex without intravenous contrast.  COMPARISON:  None.  FINDINGS: No evidence of parenchymal hemorrhage or extra-axial fluid collection. No mass lesion, mass effect, or midline shift.  No CT evidence of acute infarction.  Cerebral volume is within normal limits.  No ventriculomegaly.  Mild mucosal thickening involving the bilateral ethmoid and right maxillary sinuses. Mastoid air cells are clear.  No evidence of calvarial fracture.  IMPRESSION: No evidence of acute intracranial  abnormality.   Electronically Signed   By: Charline Bills M.D.   On: 10/18/2013 02:42     EKG Interpretation None      MDM   Final diagnoses:  None  Headache  Well appearing. Gradual onset HA for 5 days, mild.  Clinically superficial/ MSK, discussed CT head to ensure nothing causing Pressure sensation.  CT no acute findings. Tylenol for pain.  Pt HA mild. No signs  of meningitis, [facility].   Results and differential diagnosis were discussed with the patient. Close follow up outpatient was discussed, patient comfortable with the plan.   Filed Vitals:   10/18/13 0033 10/18/13 0101 10/18/13 0130 10/18/13 0145  BP: 144/73 151/111 126/76   Pulse: 98   96  Temp: 98.4 F (36.9 C)     TempSrc: Oral     Resp: 18     SpO2: 98%  100% 100%         Enid SkeensJoshua M Joeanne Robicheaux, MD 10/18/13 [phone]

## 2013-10-18 NOTE — ED Notes (Addendum)
Sore ness on lt. Side of head. Feels sore on one spot.  When he rubs his forehead he feels a knot. No neuro deficits.

## 2013-10-18 NOTE — ED Notes (Signed)
Discharge instructions reviewed. Pt verbalized understanding.  

## 2013-10-18 NOTE — Discharge Instructions (Signed)
If you were given medicines take as directed.  If you are on coumadin or contraceptives realize their levels and effectiveness is altered by many different medicines.  If you have any reaction (rash, tongues swelling, other) to the medicines stop taking and see a physician.   Please follow up as directed and return to the ER or see a physician for new or worsening symptoms.  Thank you. Filed Vitals:   10/18/13 0033 10/18/13 0101 10/18/13 0130 10/18/13 0145  BP: 144/73 151/111 126/76   Pulse: 98   96  Temp: 98.4 F (36.9 C)     TempSrc: Oral     Resp: 18     SpO2: 98%  100% 100%

## 2013-11-06 ENCOUNTER — Encounter (HOSPITAL_COMMUNITY): Payer: Medicaid Other

## 2013-11-14 ENCOUNTER — Ambulatory Visit (HOSPITAL_COMMUNITY)
Admission: RE | Admit: 2013-11-14 | Discharge: 2013-11-14 | Disposition: A | Discharge status: Home / Self Care | Payer: Medicaid Other | Source: Ambulatory Visit | Attending: Internal Medicine | Admitting: Internal Medicine

## 2013-11-14 VITALS — BP 94/52 | HR 94 | Wt 335.0 lb

## 2013-11-14 DIAGNOSIS — I5020 Unspecified systolic (congestive) heart failure: Secondary | ICD-10-CM | POA: Insufficient documentation

## 2013-11-14 LAB — BASIC METABOLIC PANEL
BUN: 16 mg/dL (ref 6–23)
CALCIUM: 9.2 mg/dL (ref 8.4–10.5)
CHLORIDE: 105 meq/L (ref 96–112)
CO2: 27 mEq/L (ref 19–32)
CREATININE: 1.48 mg/dL — AB (ref 0.50–1.35)
GFR calc non Af Amer: 52 mL/min — ABNORMAL LOW (ref >90)
GFR, EST AFRICAN AMERICAN: 61 mL/min — AB (ref >90)
Glucose, Bld: 103 mg/dL — ABNORMAL HIGH (ref 70–99)
Potassium: 4.2 mEq/L (ref 3.7–5.3)
Sodium: 144 mEq/L (ref 137–147)

## 2013-11-14 MED ORDER — TORSEMIDE 20 MG PO TABS: 80.0000 mg | ORAL_TABLET | Freq: Two times a day (BID) | ORAL | Status: DC

## 2013-11-14 MED ORDER — POTASSIUM CHLORIDE CRYS ER 20 MEQ PO TBCR: 40.0000 meq | EXTENDED_RELEASE_TABLET | Freq: Two times a day (BID) | ORAL | Status: DC

## 2013-11-14 MED ORDER — METOLAZONE 2.5 MG PO TABS: 2.5000 mg | ORAL_TABLET | ORAL | Status: DC | PRN

## 2013-11-14 NOTE — Patient Instructions (Signed)
Follow up in 2 weeks  Take torsemide 80 mg twice a day  Take 40 meq potassium twice a day  Take 2.5 mg metolazone today.    Do the following things EVERYDAY: 1) Weigh yourself in the morning before breakfast. Write it down and keep it in a log. 2) Take your medicines as prescribed 3) Eat low salt foods-Limit salt (sodium) to 2000 mg per day.  4) Stay as active as you can everyday 5) Limit all fluids for the day to less than 2 liters

## 2013-11-14 NOTE — Progress Notes (Signed)
Patient ID: Eric Murillo, male   DOB: 1960/11/04, 53 y.o.   MRN: [id number] PCP: Dr. Dierdre SearlesLi  53 yo with history of obesity, hyperlipidemia, HTN, and nonischemic cardiomyopathy with CHF returns for cardiology followup.  He was diagnosed with severe OSA but has been unable to tolerate CPAP.    Most recent echo in 8/14 showed improvement in EF to 45%.  He returns for follow up. Last visit torsemide was cut back torsemide 80 mg in am and 40 mg in pm. Mild dyspnea with exertion. + Orthopnea sleeps on 4 pillows. SOB with steps.  He is not weighing at home.  Drinking > 2 liters. Eating highsal foods such [place] food. Taking all medications but misses his medications every now and then.    Labs (3/12): K 4.1, creatinine 1.5, LDL 91, HDL 29, LFTs normal, BNP 260, HCT 43 Labs (6/12): K 4, creatinine 1.4, HIV negative, TSH normal, ANA normal, SPEP negative Labs (4/13): LDL 75, HDL 30, K 4.2, creatinine 1.471.57 Labs (7/13): BNP 356, K 3.9, creatinine 1.5 Labs (1/14): K 3.9, creatinine 1.4, LDL 73, HDL 29 Labs (3/14): BNP 215, K 3.5, creatinine 1.5 Labs (4/14): K 4.2, creatinine 1.35 Labs (5/14): K 3.7, creatinine 1.5 Labs (12/14): K 4.2, creatinine 1.27, LDL 84, HDL 31, TSH normal Labs (3/15): K 4.6, creatinine 1.29 Labs (3/17/145) K 3.9 Creatinine 1.44  Past Medical History: 1. Nonischemic cardiomyopathy: Echo (3/12) with EF 30-35% and mildly dilated LV, diffuse LV hypokinesis, moderate MR, PA systolic pressure 55 mmHg.  Left and right heart cath (3/12): No angiographic CAD; mean RA 20, PA 76/41, mean PCWP 38, CI 2.1.  ANA, SPEP, and HIV negative.  TSH normal.  Denies drug abuse, heavy ETOH intake.  No family history of cardiomyopathy.  Cardiomyopathy may be due to long-standing HTN.  Echo (6/13): EF 35-40%, moderate LV dilation, moderate to severe LAE, PA systolic pressure 52 mmHg.  Unable to fit in magnet for cardiac MRI.  Echo (8/14) with EF 45%, moderately dilated LV, mild LVH, normal RV, PA systolic  pressure 49 mmHg.  2. ERECTILE DYSFUNCTION 3. HYPERTENSION  4. CKD 5. DYSLIPIDEMIA  6. Obesity 7. Hyperlipidemia 8. Gout 9. OSA: Severe on 6/13 sleep study.  10. Sialolithiasis    Family History: No history of cardiomyopathy No history of cancer among first degree relatives father - died of MI 33(70s)  mother - HTN  Social History: Lives with mother.  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  1 sexual partner (male), no contraceptives.  1 adult child. tobacco - none alcohol - none drugs - none  ROS: All systems reviewed and negative except as per HPI.   Current Outpatient Prescriptions  Medication Sig Dispense Refill  . aspirin EC 81 MG tablet Take 81 mg by mouth daily.      . carvedilol (COREG) 25 MG tablet Take 1 tablet (25 mg total) by mouth 2 (two) times daily with a meal.  60 tablet  6  . enalapril (VASOTEC) 20 MG tablet Take 40 mg by mouth daily.      . hydrALAZINE (APRESOLINE) 50 MG tablet Take 1 tablet (50 mg total) by mouth 3 (three) times daily.  90 tablet  6  . ibuprofen (ADVIL,MOTRIN) 800 MG tablet Take 1 tablet (800 mg total) by mouth 3 (three) times daily.  21 tablet  0  . isosorbide mononitrate (IMDUR) 60 MG 24 hr tablet Take 1 tablet (60 mg total) by mouth daily.  30 tablet  6  . potassium chloride SA (K-DUR,KLOR-CON)  20 MEQ tablet Take 2 tablets (40 mEq total) by mouth daily.  60 tablet  6  . predniSONE (DELTASONE) 10 MG tablet Take 20 mg by mouth as needed. For gout      . simvastatin (ZOCOR) 40 MG tablet Take 1 tablet (40 mg total) by mouth at bedtime.  30 tablet  6  . spironolactone (ALDACTONE) 25 MG tablet Take 1 tablet (25 mg total) by mouth daily.  30 tablet  6  . torsemide (DEMADEX) 20 MG tablet Take 4 tablets every morning and take 2 tablet every evening       No current facility-administered medications for this encounter.    BP 94/52  Pulse 94  Wt 335 lb (151.955 kg)  SpO2 95% General: NAD, obese.  Neck:  Thick, JVP ~10 cm, no thyromegaly or thyroid  nodule.  Lungs: Clear to auscultation bilaterally with normal respiratory effort. CV: Nondisplaced PMI.  Heart regular S1/S2, +S4, no murmur.  No carotid bruit.   Abdomen: Soft, nontender, no hepatosplenomegaly, +++ distention.  Neurologic: Alert and oriented x 3.  Psych: Normal affect. Extremities: No clubbing or cyanosis. RLE and LL 1-2+ edema 1/2 to knees.   Assessment/Plan:  Systolic heart failure  Nonischemic cardiomyopathy, possibly due to long-standing poorly-controlled HTN. EF 45% on last echo (improved). NYHA class III symptoms.  Weight up 18 pounds from last visit. He is volume overloaded on exam.  - Increase torsemide to 80 mg twice a day and add metolazone 2.5 mg for one dose.    - Increase potassium to 40 meq twice a day.  - Needs BMET/BNP today.  - Continue current doses of enalapril, Coreg, and spironolactone. - Continue current hydralazine/Imdur.   - Provided weight chart for daily weight. Provided a scale for daily weights .  RENAL INSUFFICIENCY Check BMET today.  HYPERTENSION  BP control reasonable.  OSA Severe OSA. He has not tolerated CPAP. Obesity Weight loss is imperative.  We talked at length about portion control, avoiding "junk food" and cutting back on sodium.   Follow up in 2 weeks with Pro BNP and BMET    Amy D Clegg NP-C  11/14/2013  Patient seen with NP, agree with the above note.  Patient's weight is up and he is volume overloaded.  Poor diet is certainly playing a role in this.   As above, will increase torsemide to 80 mg daily + KCl 40 mEq bid.  I will give him one dose of metolazone. He will need close office followup and frequent BMETs.    Laurey Morale 11/15/2013

## 2013-11-28 ENCOUNTER — Telehealth (HOSPITAL_COMMUNITY): Payer: Self-pay | Admitting: Anesthesiology

## 2013-11-28 ENCOUNTER — Ambulatory Visit (HOSPITAL_COMMUNITY)
Admission: RE | Admit: 2013-11-28 | Discharge: 2013-11-28 | Disposition: A | Discharge status: Home / Self Care | Payer: Medicaid Other | Source: Ambulatory Visit | Attending: Internal Medicine | Admitting: Internal Medicine

## 2013-11-28 VITALS — BP 113/52 | HR 87 | Resp 16 | Wt 324.1 lb

## 2013-11-28 DIAGNOSIS — Z79899 Other long term (current) drug therapy: Secondary | ICD-10-CM | POA: Insufficient documentation

## 2013-11-28 DIAGNOSIS — Z8249 Family history of ischemic heart disease and other diseases of the circulatory system: Secondary | ICD-10-CM | POA: Insufficient documentation

## 2013-11-28 DIAGNOSIS — I428 Other cardiomyopathies: Secondary | ICD-10-CM | POA: Insufficient documentation

## 2013-11-28 DIAGNOSIS — K115 Sialolithiasis: Secondary | ICD-10-CM | POA: Insufficient documentation

## 2013-11-28 DIAGNOSIS — I509 Heart failure, unspecified: Secondary | ICD-10-CM

## 2013-11-28 DIAGNOSIS — M109 Gout, unspecified: Secondary | ICD-10-CM | POA: Insufficient documentation

## 2013-11-28 DIAGNOSIS — Z7982 Long term (current) use of aspirin: Secondary | ICD-10-CM | POA: Insufficient documentation

## 2013-11-28 DIAGNOSIS — I5020 Unspecified systolic (congestive) heart failure: Secondary | ICD-10-CM | POA: Insufficient documentation

## 2013-11-28 DIAGNOSIS — N259 Disorder resulting from impaired renal tubular function, unspecified: Secondary | ICD-10-CM

## 2013-11-28 DIAGNOSIS — N189 Chronic kidney disease, unspecified: Secondary | ICD-10-CM | POA: Insufficient documentation

## 2013-11-28 DIAGNOSIS — E669 Obesity, unspecified: Secondary | ICD-10-CM | POA: Insufficient documentation

## 2013-11-28 DIAGNOSIS — G4733 Obstructive sleep apnea (adult) (pediatric): Secondary | ICD-10-CM

## 2013-11-28 DIAGNOSIS — I129 Hypertensive chronic kidney disease with stage 1 through stage 4 chronic kidney disease, or unspecified chronic kidney disease: Secondary | ICD-10-CM | POA: Insufficient documentation

## 2013-11-28 DIAGNOSIS — I10 Essential (primary) hypertension: Secondary | ICD-10-CM

## 2013-11-28 DIAGNOSIS — E785 Hyperlipidemia, unspecified: Secondary | ICD-10-CM | POA: Insufficient documentation

## 2013-11-28 DIAGNOSIS — N529 Male erectile dysfunction, unspecified: Secondary | ICD-10-CM | POA: Insufficient documentation

## 2013-11-28 DIAGNOSIS — Z6841 Body Mass Index (BMI) 40.0 and over, adult: Secondary | ICD-10-CM

## 2013-11-28 LAB — BASIC METABOLIC PANEL
BUN: 42 mg/dL — ABNORMAL HIGH (ref 6–23)
CALCIUM: 9.5 mg/dL (ref 8.4–10.5)
CO2: 26 meq/L (ref 19–32)
Chloride: 100 mEq/L (ref 96–112)
Creatinine, Ser: 2.75 mg/dL — ABNORMAL HIGH (ref 0.50–1.35)
GFR calc Af Amer: 29 mL/min — ABNORMAL LOW (ref >90)
GFR, EST NON AFRICAN AMERICAN: 25 mL/min — AB (ref >90)
Glucose, Bld: 97 mg/dL (ref 70–99)
POTASSIUM: 4.4 meq/L (ref 3.7–5.3)
SODIUM: 142 meq/L (ref 137–147)

## 2013-11-28 LAB — PRO B NATRIURETIC PEPTIDE: PRO B NATRI PEPTIDE: 746.3 pg/mL — AB (ref 0–125)

## 2013-11-28 MED ORDER — TORSEMIDE 20 MG PO TABS: 40.0000 mg | ORAL_TABLET | Freq: Two times a day (BID) | ORAL | Status: DC

## 2013-11-28 MED ORDER — POTASSIUM CHLORIDE CRYS ER 20 MEQ PO TBCR: 20.0000 meq | EXTENDED_RELEASE_TABLET | Freq: Two times a day (BID) | ORAL | Status: DC

## 2013-11-28 NOTE — Patient Instructions (Signed)
Doing great.   Continue to weigh daily and call if your weight starts trending up.  Try to be more active and watch your portion sizes.  F/U 1 month  Do the following things EVERYDAY: 1) Weigh yourself in the morning before breakfast. Write it down and keep it in a log. 2) Take your medicines as prescribed 3) Eat low salt foods-Limit salt (sodium) to 2000 mg per day.  4) Stay as active as you can everyday 5) Limit all fluids for the day to less than 2 liters 6)

## 2013-11-28 NOTE — Telephone Encounter (Signed)
Pt aware and verbalizes understanding, is not able to come for recheck labs until Fri 5/15

## 2013-11-28 NOTE — Progress Notes (Signed)
Patient ID: Eric Murillo, male   DOB: [birth year], 53 y.o.   MRN: [id number] PCP: Dr. Dierdre SearlesLi  53 yo with history of obesity, hyperlipidemia, HTN, and nonischemic cardiomyopathy with CHF returns for cardiology followup.  He was diagnosed with severe OSA but has been unable to tolerate CPAP.    Most recent echo in 8/14 showed improvement in EF to 45%.  Follow up:  Last visit volume overloaded and increased torsemide 80 mg BID with a dose of 2.5 mg metolazone. Denies SOB, PND, orthopnea or CP. Weight at home 317 lbs. Trying to follow a low salt diet and drinking less than 2L a day. Taking medications as prescribed. Thinks he maybe able to go up 2 flights of stairs with no issues. Able to walk at least a block. Sometimes wearing CPAP.   Labs (6/12): K 4, creatinine 1.4, HIV negative, TSH normal, ANA normal, SPEP negative Labs (4/13): LDL 75, HDL 30, K 4.2, creatinine 6.571.57 Labs (7/13): BNP 356, K 3.9, creatinine 1.5 Labs (1/14): K 3.9, creatinine 1.4, LDL 73, HDL 29 Labs (3/14): BNP 215, K 3.5, creatinine 1.5 Labs (4/14): K 4.2, creatinine 1.35 Labs (5/14): K 3.7, creatinine 1.5 Labs (12/14): K 4.2, creatinine 1.27, LDL 84, HDL 31, TSH normal Labs (3/15): K 4.6, creatinine 1.29 Labs (3/17/145) K 3.9 Creatinine 1.44 Labs (11/14/13) K 4.2, creatinine 1.48  Past Medical History: 1. Nonischemic cardiomyopathy: Echo (3/12) with EF 30-35% and mildly dilated LV, diffuse LV hypokinesis, moderate MR, PA systolic pressure 55 mmHg.  Left and right heart cath (3/12): No angiographic CAD; mean RA 20, PA 76/41, mean PCWP 38, CI 2.1.  ANA, SPEP, and HIV negative.  TSH normal.  Denies drug abuse, heavy ETOH intake.  No family history of cardiomyopathy.  Cardiomyopathy may be due to long-standing HTN.  Echo (6/13): EF 35-40%, moderate LV dilation, moderate to severe LAE, PA systolic pressure 52 mmHg.  Unable to fit in magnet for cardiac MRI.  Echo (8/14) with EF 45%, moderately dilated LV, mild LVH, normal RV, PA systolic  pressure 49 mmHg.  2. ERECTILE DYSFUNCTION 3. HYPERTENSION  4. CKD 5. DYSLIPIDEMIA  6. Obesity 7. Hyperlipidemia 8. Gout 9. OSA: Severe on 6/13 sleep study.  10. Sialolithiasis    Family History: No history of cardiomyopathy No history of cancer among first degree relatives father - died of MI 45(70s)  mother - HTN  Social History: Lives with mother.  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  1 sexual partner (male), no contraceptives.  1 adult child. tobacco - none alcohol - none drugs - none  ROS: All systems reviewed and negative except as per HPI.   Current Outpatient Prescriptions  Medication Sig Dispense Refill  . aspirin EC 81 MG tablet Take 81 mg by mouth daily.      . carvedilol (COREG) 25 MG tablet Take 1 tablet (25 mg total) by mouth 2 (two) times daily with a meal.  60 tablet  6  . enalapril (VASOTEC) 20 MG tablet Take 40 mg by mouth daily.      . hydrALAZINE (APRESOLINE) 50 MG tablet Take 1 tablet (50 mg total) by mouth 3 (three) times daily.  90 tablet  6  . isosorbide mononitrate (IMDUR) 60 MG 24 hr tablet Take 1 tablet (60 mg total) by mouth daily.  30 tablet  6  . metolazone (ZAROXOLYN) 2.5 MG tablet Take 1 tablet (2.5 mg total) by mouth as needed.  8 tablet  6  . potassium chloride SA (K-DUR,KLOR-CON) 20 MEQ tablet  Take 2 tablets (40 mEq total) by mouth 2 (two) times daily.  120 tablet  6  . predniSONE (DELTASONE) 10 MG tablet Take 20 mg by mouth as needed. For gout      . simvastatin (ZOCOR) 40 MG tablet Take 1 tablet (40 mg total) by mouth at bedtime.  30 tablet  6  . spironolactone (ALDACTONE) 25 MG tablet Take 1 tablet (25 mg total) by mouth daily.  30 tablet  6  . torsemide (DEMADEX) 20 MG tablet Take 80 mg by mouth 2 (two) times daily.       No current facility-administered medications for this encounter.    BP 113/52  Pulse 87  Resp 16  Wt 324 lb 2 oz (147.022 kg)  SpO2 99% General: NAD, obese.  Neck:  Thick, JVP difficult to assess d/t body habitus  but does not appear elevated, no thyromegaly or thyroid nodule.  Lungs: Clear to auscultation bilaterally with normal respiratory effort. CV: Nondisplaced PMI.  Heart regular S1/S2, +S4, no murmur.  No carotid bruit.   Abdomen: Soft, nontender, no hepatosplenomegaly, mildly distention.  Neurologic: Alert and oriented x 3.  Psych: Normal affect. Extremities: No clubbing or cyanosis. No edema  Assessment/Plan:  Systolic heart failure  Nonischemic cardiomyopathy, possibly due to long-standing poorly-controlled HTN. EF 45% (02/2013). NYHA class II symptoms and volume status good. Weight is down 11 lbs since last visit. Will continue torsemide 80 mg BID and potassium 40 meq BID.  - BMET/pro-BNP today - Continue current doses of enalapril, Coreg, and spironolactone. - Continue current hydralazine/Imdur.   - Reinforced the need and importance of daily weights, a low sodium diet, and fluid restriction (less than 2 L a day). Instructed to call the HF clinic if weight increases more than 3 lbs overnight or 5 lbs in a week. Marland Kitchen  RENAL INSUFFICIENCY Check BMET today.  HYPERTENSION  Stable. Continue current medications.   OSA Reports sometimes wearing CPAP and encouraged him to wear as much as possible.  Obesity Weight loss is imperative.  We talked at length about portion control, avoiding "junk food", cutting back on sodium and being more active.    F/U 1 month  Aundria Rud NP-C  11/28/2013  Addendum: Patients labs reviewed and Creatinine elevated from 1.48 to 2.75. BUN 42, K+ 4.4 and [id number]. Patient in the past was on lasix 80 mg BID, but in the past month has had volume overload and was started on torsemide. Will hold torsemide for 2 days and start taking torsemide 40 gm BID on Monday. Stop metolazone and change potassium to 20 meq BID. Recheck BMET on Thursday.

## 2013-11-28 NOTE — Telephone Encounter (Signed)
Reviewed patients labs and Cr elevated 2.75 from 1.48. Left message with mother to call us.   At OV patient reports taking torsemide 80 mg BID, please confirm. He needs to hold his torsemide for 2 days and stop taking metolazone. On Monday start taking torsemide 40 mg BID and recheck BMET on Thursday. Can decrease potassium to 20 meq BID.  Aundria Rud NP-C 12:28 PM

## 2013-12-05 ENCOUNTER — Ambulatory Visit (HOSPITAL_COMMUNITY)
Admission: RE | Admit: 2013-12-05 | Discharge: 2013-12-05 | Disposition: A | Discharge status: Home / Self Care | Payer: Medicaid Other | Source: Ambulatory Visit | Attending: Internal Medicine | Admitting: Internal Medicine

## 2013-12-05 DIAGNOSIS — I509 Heart failure, unspecified: Secondary | ICD-10-CM | POA: Insufficient documentation

## 2013-12-05 DIAGNOSIS — I5022 Chronic systolic (congestive) heart failure: Secondary | ICD-10-CM

## 2013-12-05 LAB — BASIC METABOLIC PANEL
BUN: 32 mg/dL — ABNORMAL HIGH (ref 6–23)
CO2: 27 mEq/L (ref 19–32)
Calcium: 9.6 mg/dL (ref 8.4–10.5)
Chloride: 104 mEq/L (ref 96–112)
Creatinine, Ser: 2.01 mg/dL — ABNORMAL HIGH (ref 0.50–1.35)
GFR calc Af Amer: 42 mL/min — ABNORMAL LOW (ref >90)
GFR, EST NON AFRICAN AMERICAN: 36 mL/min — AB (ref >90)
Glucose, Bld: 99 mg/dL (ref 70–99)
Potassium: 4.8 mEq/L (ref 3.7–5.3)
SODIUM: 145 meq/L (ref 137–147)

## 2013-12-18 ENCOUNTER — Telehealth (HOSPITAL_COMMUNITY): Payer: Self-pay | Admitting: Anesthesiology

## 2013-12-18 DIAGNOSIS — I5022 Chronic systolic (congestive) heart failure: Secondary | ICD-10-CM

## 2013-12-18 MED ORDER — HYDRALAZINE HCL 50 MG PO TABS
25.0000 mg | ORAL_TABLET | Freq: Three times a day (TID) | ORAL | Status: DC
Start: 2013-12-18 — End: 2014-01-05

## 2013-12-18 NOTE — Telephone Encounter (Signed)
Patient called with dizziness and blood pressure in the upper 80s. Weight stable. Will have patient cut hydralazine back to 25 mg TID and send new Rx to pharmacy.  Aundria Rud NP-C 4:04 PM

## 2013-12-25 ENCOUNTER — Encounter (HOSPITAL_COMMUNITY): Payer: Self-pay | Admitting: Emergency Medicine

## 2013-12-25 ENCOUNTER — Emergency Department (HOSPITAL_COMMUNITY): Payer: Medicaid Other

## 2013-12-25 ENCOUNTER — Emergency Department (HOSPITAL_COMMUNITY)
Admission: EM | Admit: 2013-12-25 | Discharge: 2013-12-25 | Disposition: A | Discharge status: Home / Self Care | Payer: Medicaid Other | Attending: Emergency Medicine | Admitting: Emergency Medicine

## 2013-12-25 DIAGNOSIS — Z87448 Personal history of other diseases of urinary system: Secondary | ICD-10-CM | POA: Insufficient documentation

## 2013-12-25 DIAGNOSIS — Z79899 Other long term (current) drug therapy: Secondary | ICD-10-CM | POA: Insufficient documentation

## 2013-12-25 DIAGNOSIS — N189 Chronic kidney disease, unspecified: Secondary | ICD-10-CM | POA: Insufficient documentation

## 2013-12-25 DIAGNOSIS — I509 Heart failure, unspecified: Secondary | ICD-10-CM | POA: Insufficient documentation

## 2013-12-25 DIAGNOSIS — I129 Hypertensive chronic kidney disease with stage 1 through stage 4 chronic kidney disease, or unspecified chronic kidney disease: Secondary | ICD-10-CM | POA: Insufficient documentation

## 2013-12-25 DIAGNOSIS — IMO0002 Reserved for concepts with insufficient information to code with codable children: Secondary | ICD-10-CM | POA: Insufficient documentation

## 2013-12-25 DIAGNOSIS — E785 Hyperlipidemia, unspecified: Secondary | ICD-10-CM | POA: Insufficient documentation

## 2013-12-25 DIAGNOSIS — M109 Gout, unspecified: Secondary | ICD-10-CM | POA: Insufficient documentation

## 2013-12-25 DIAGNOSIS — Z9889 Other specified postprocedural states: Secondary | ICD-10-CM | POA: Insufficient documentation

## 2013-12-25 DIAGNOSIS — Z7982 Long term (current) use of aspirin: Secondary | ICD-10-CM | POA: Insufficient documentation

## 2013-12-25 DIAGNOSIS — M25562 Pain in left knee: Secondary | ICD-10-CM

## 2013-12-25 DIAGNOSIS — M25569 Pain in unspecified knee: Secondary | ICD-10-CM | POA: Insufficient documentation

## 2013-12-25 MED ORDER — OXYCODONE-ACETAMINOPHEN 5-325 MG PO TABS
1.0000 | ORAL_TABLET | Freq: Once | ORAL | Status: AC
  Administered 2013-12-25: 1 via ORAL
  Filled 2013-12-25: qty 1

## 2013-12-25 MED ORDER — HYDROCODONE-ACETAMINOPHEN 5-325 MG PO TABS: 1.0000 | ORAL_TABLET | ORAL | Status: DC | PRN

## 2013-12-25 NOTE — Discharge Instructions (Signed)
Follow up with the doctor you saw before about your knee. Return here as needed.

## 2013-12-25 NOTE — ED Notes (Signed)
PT c/o left knee pain starting yesterday morning. PT c/o constant pain in knee. No injury or trauma to knee.

## 2013-12-25 NOTE — ED Provider Notes (Signed)
Medical screening examination/treatment/procedure(s) were performed by non-physician practitioner and as supervising physician I was immediately available for consultation/collaboration.  Flint Melter, MD 12/25/13 [phone]

## 2013-12-25 NOTE — ED Provider Notes (Signed)
CSN: [id number]     Arrival date & time 12/25/13  4403 History   First MD Initiated Contact with Patient 12/25/13 [phone]     Chief Complaint  Patient presents with  . Knee Pain     (Consider location/radiation/quality/duration/timing/severity/associated sxs/prior Treatment) Patient is a 53 y.o. male presenting with knee pain. The history is provided by the patient.  Knee Pain Location:  Knee Time since incident:  1 day Injury: no   Knee location:  L knee Pain details:    Quality:  Burning and aching   Radiates to:  Does not radiate   Severity:  Moderate   Onset quality:  Gradual   Duration:  24 hours   Timing:  Constant   Progression:  Worsening Chronicity:  Recurrent Dislocation: no   Foreign body present:  No foreign bodies Prior injury to area:  No Relieved by:  None tried Worsened by:  Bearing weight and flexion Ineffective treatments:  None tried Risk factors: obesity    Eric Murillo is a 53 y.o. male who presents to the ED with left knee pain that started yesterday. He has had problems with his knee in the past and saw an orthopedic doctor in [place] and told he had wear and tear on the knee. It got better but yesterday started hurting without known injury. He has not taken anything for pain. He is morbidly obese, has a history of Gout, HTN, CHF and kidney disease.    Past Medical History  Diagnosis Date  . Hypertension   . Hyperlipidemia   . CHF (congestive heart failure)     EF 50-55%    . Erectile dysfunction   . Obesity   . Gout   . CKD (chronic kidney disease)    Past Surgical History  Procedure Laterality Date  . Colonoscopy    . Salivary gland surgery  09/12/2012  . Cardiac catheterization  2012  . Submandibular gland excision Right 09/12/2012    Procedure: Removal Right Submandibular Larina Bras;  Surgeon: Serena Colonel, MD;  Location: [facility] OR;  Service: ENT;  Laterality: Right;   Family History  Problem Relation Age of Onset  . Hypertension Mother   .  Hypertension Father   . Cancer Father     brother died of brain cancer; sister died of bone cancer  . Diabetes Sister   . Diabetes Brother   . Colon cancer Maternal Aunt   . Cardiomyopathy Neg Hx    History  Substance Use Topics  . Smoking status: Never Smoker   . Smokeless tobacco: Never Used  . Alcohol Use: No    Review of Systems Negative except as stated in HPI   Allergies  Beta adrenergic blockers  Home Medications   Prior to Admission medications   Medication Sig Start Date End Date Taking? Authorizing Provider  aspirin EC 81 MG tablet Take 81 mg by mouth daily.    Historical Provider, MD  carvedilol (COREG) 25 MG tablet Take 1 tablet (25 mg total) by mouth 2 (two) times daily with a meal. 10/07/13   Laurey Morale, MD  enalapril (VASOTEC) 20 MG tablet Take 40 mg by mouth daily.    Historical Provider, MD  hydrALAZINE (APRESOLINE) 50 MG tablet Take 0.5 tablets (25 mg total) by mouth 3 (three) times daily. 12/18/13   Aundria Rud, NP  isosorbide mononitrate (IMDUR) 60 MG 24 hr tablet Take 1 tablet (60 mg total) by mouth daily. 10/07/13   Laurey Morale, MD  potassium chloride  SA (K-DUR,KLOR-CON) 20 MEQ tablet Take 1 tablet (20 mEq total) by mouth 2 (two) times daily. 11/28/13   Aundria RudAli B Cosgrove, NP  predniSONE (DELTASONE) 10 MG tablet Take 20 mg by mouth as needed. For gout 09/25/13   Benny LennertJoseph L Zammit, MD  simvastatin (ZOCOR) 40 MG tablet Take 1 tablet (40 mg total) by mouth at bedtime. 10/07/13   Laurey Moralealton S McLean, MD  spironolactone (ALDACTONE) 25 MG tablet Take 1 tablet (25 mg total) by mouth daily. 10/07/13   Laurey Moralealton S McLean, MD  torsemide (DEMADEX) 20 MG tablet Take 2 tablets (40 mg total) by mouth 2 (two) times daily. 11/28/13   Aundria RudAli B Cosgrove, NP   BP 130/98  Pulse 95  Temp(Src) 97.9 F (36.6 C) (Oral)  Resp 20  Ht 5' 9.5" (1.765 m)  Wt 317 lb (143.79 kg)  BMI 46.16 kg/m2  SpO2 99% Physical Exam  Nursing note and vitals reviewed. Constitutional: He is oriented to  person, place, and time. He appears well-developed and well-nourished. No distress.  Morbidly obese with elevated BP  HENT:  Head: Normocephalic and atraumatic.  Eyes: Conjunctivae and EOM are normal.  Neck: Neck supple.  Cardiovascular: Normal rate.   Pulmonary/Chest: Effort normal.  Musculoskeletal: Normal range of motion.       Left knee: He exhibits bony tenderness. He exhibits normal range of motion, no ecchymosis, no deformity, no laceration, no erythema and normal alignment. Swelling: minimal. Tenderness found. Patellar tendon tenderness noted.       Legs: Pedal pulse present, good touch sensation. Pain increases with flexion of the knee.   Neurological: He is alert and oriented to person, place, and time. No cranial nerve deficit.  Skin: Skin is warm and dry.  Psychiatric: He has a normal mood and affect. His behavior is normal.   Dg Knee Complete 4 Views Left  12/25/2013   CLINICAL DATA:  Anterior knee pain  EXAM: LEFT KNEE - COMPLETE 4+ VIEW  COMPARISON:  None.  FINDINGS: There is no evidence of fracture, dislocation, or joint effusion. There is no evidence of arthropathy or other focal bone abnormality. Soft tissues are unremarkable.  IMPRESSION: Negative.   Electronically Signed   By: Elige KoHetal  Patel   On: 12/25/2013 11:17    ED Course  Procedures  MDM  53 y.o. male with left knee pain. History of same in the past. Placed in knee immobilizer, pain management. Follow up with ortho. Stable for discharge without neurovascular deficits.    [facility] Hospital[facility] Orlene OchM Zephan Beauchaine, [place] 12/25/13 1148

## 2013-12-27 ENCOUNTER — Encounter (HOSPITAL_COMMUNITY): Payer: Self-pay | Admitting: Emergency Medicine

## 2013-12-27 ENCOUNTER — Emergency Department (HOSPITAL_COMMUNITY)
Admission: EM | Admit: 2013-12-27 | Discharge: 2013-12-27 | Disposition: A | Discharge status: Home / Self Care | Payer: Medicaid Other | Attending: Emergency Medicine | Admitting: Emergency Medicine

## 2013-12-27 DIAGNOSIS — E785 Hyperlipidemia, unspecified: Secondary | ICD-10-CM | POA: Insufficient documentation

## 2013-12-27 DIAGNOSIS — I129 Hypertensive chronic kidney disease with stage 1 through stage 4 chronic kidney disease, or unspecified chronic kidney disease: Secondary | ICD-10-CM | POA: Insufficient documentation

## 2013-12-27 DIAGNOSIS — N189 Chronic kidney disease, unspecified: Secondary | ICD-10-CM | POA: Insufficient documentation

## 2013-12-27 DIAGNOSIS — M25569 Pain in unspecified knee: Secondary | ICD-10-CM | POA: Insufficient documentation

## 2013-12-27 DIAGNOSIS — E669 Obesity, unspecified: Secondary | ICD-10-CM | POA: Insufficient documentation

## 2013-12-27 DIAGNOSIS — I509 Heart failure, unspecified: Secondary | ICD-10-CM | POA: Insufficient documentation

## 2013-12-27 DIAGNOSIS — M25561 Pain in right knee: Secondary | ICD-10-CM

## 2013-12-27 DIAGNOSIS — Z79899 Other long term (current) drug therapy: Secondary | ICD-10-CM | POA: Insufficient documentation

## 2013-12-27 DIAGNOSIS — Z7982 Long term (current) use of aspirin: Secondary | ICD-10-CM | POA: Insufficient documentation

## 2013-12-27 DIAGNOSIS — M79609 Pain in unspecified limb: Secondary | ICD-10-CM | POA: Diagnosis not present

## 2013-12-27 MED ORDER — OXYCODONE-ACETAMINOPHEN 5-325 MG PO TABS
1.0000 | ORAL_TABLET | Freq: Once | ORAL | Status: AC
  Administered 2013-12-27: 1 via ORAL
  Filled 2013-12-27: qty 1

## 2013-12-27 MED ORDER — OXYCODONE-ACETAMINOPHEN 5-325 MG PO TABS: 1.0000 | ORAL_TABLET | ORAL | Status: DC | PRN

## 2013-12-27 NOTE — ED Notes (Signed)
Pt states L knee and ankle swelling over the past several days. Was seen at [facility] for same and discharged home with medications. Pt states no relief. 7/10 pain at the time, increases with movement. No obvious deformities noted. Denies recent injury. Pulses intact.

## 2013-12-27 NOTE — Discharge Instructions (Signed)
Keep knee elevated. Ice. Home with orthopedics or primary care follow up as soon as able. Percocet for pain. Continue ibuprofen or naprosyn in addition for pain and inflammation.   Knee Pain Knee pain can be a result of an injury or other medical conditions. Treatment will depend on the cause of your pain. HOME CARE  Only take medicine as told by your doctor.  Keep a healthy weight. Being overweight can make the knee hurt more.  Stretch before exercising or playing sports.  If there is constant knee pain, change the way you exercise. Ask your doctor for advice.  Make sure shoes fit well. Choose the right shoe for the sport or activity.  Protect your knees. Wear kneepads if needed.  Rest when you are tired. GET HELP RIGHT AWAY IF:   Your knee pain does not stop.  Your knee pain does not get better.  Your knee joint feels hot to the touch.  You have a fever. MAKE SURE YOU:   Understand these instructions.  Will watch this condition.  Will get help right away if you are not doing well or get worse. Document Released: 10/06/2008 Document Revised: 10/02/2011 Document Reviewed: 10/06/2008 [facility] Patient Information 2014 [place], [place].

## 2013-12-27 NOTE — ED Notes (Addendum)
Pt reports left knee pain and swelling, no hx of knee injury. Was seen at [facility] recently for same and given medications. Also having recent swelling to bilateral ankles and has hx of chf. Denies sob.

## 2013-12-27 NOTE — Progress Notes (Signed)
VASCULAR LAB PRELIMINARY  PRELIMINARY  PRELIMINARY  PRELIMINARY  Left lower extremity venous Doppler completed.    Preliminary report:  There is no DVT or SVT noted in the left lower extremity.  Kern Alberta, RVT 12/27/2013, 12:11 PM

## 2013-12-27 NOTE — ED Provider Notes (Signed)
Medical screening examination/treatment/procedure(s) were conducted as a shared visit with non-physician practitioner(s) and myself.  I personally evaluated the patient during the encounter.   Pt c/o left knee pain, dull, moderate. Denies injury. No erythema or increased warmth. Passive rom without pain. Knee stable. No effusion. Distal pulses palp   Suzi Roots, MD 12/27/13 [phone]

## 2013-12-27 NOTE — ED Provider Notes (Signed)
CSN: [id number]     Arrival date & time 12/27/13  [id number] History   First MD Initiated Contact with Patient 12/27/13 1031     Chief Complaint  Patient presents with  . Knee Pain  . Leg Swelling     (Consider location/radiation/quality/duration/timing/severity/associated sxs/prior Treatment) HPI .Blanchard KelchJasper Divito is a 53 y.o. male who presents to emergency department complaining of left knee pain. Patient states that his pain began 3 days ago. He denies any injuries. He states that he recently has gained some weight, states when he gains weight and seems to trigger her joint pains. He denies any fever, chills. [facility] and had x-rays done which was negative. Was treated with Norco. States it's not helping. States pain is to the anterior knee, worsened with movement and walking, nothing makes it better. Patient states the reason he came back to the ER and is now he has swelling to his calf, lower leg, foot. Patient denies any history of blood clots. He denies any recent travel or surgeries. He is not a smoker.   Past Medical History  Diagnosis Date  . Hypertension   . Hyperlipidemia   . CHF (congestive heart failure)     EF 50-55%    . Erectile dysfunction   . Obesity   . Gout   . CKD (chronic kidney disease)    Past Surgical History  Procedure Laterality Date  . Colonoscopy    . Salivary gland surgery  09/12/2012  . Cardiac catheterization  2012  . Submandibular gland excision Right 09/12/2012    Procedure: Removal Right Submandibular Larina BrasStone;  Surgeon: Serena ColonelJefry Rosen, MD;  [facility];  Service: ENT;  Laterality: Right;   Family History  Problem Relation Age of Onset  . Hypertension Mother   . Hypertension Father   . Cancer Father     brother died of brain cancer; sister died of bone cancer  . Diabetes Sister   . Diabetes Brother   . Colon cancer Maternal Aunt   . Cardiomyopathy Neg Hx    History  Substance Use Topics  . Smoking status: Never Smoker    . Smokeless tobacco: Never Used  . Alcohol Use: No    Review of Systems  Constitutional: Negative for fever and chills.  Respiratory: Negative for cough, chest tightness and shortness of breath.   Cardiovascular: Negative for chest pain, palpitations and leg swelling.  Gastrointestinal: Negative for nausea, vomiting, abdominal pain, diarrhea and abdominal distention.  Genitourinary: Negative for dysuria, urgency, frequency and hematuria.  Musculoskeletal: Positive for arthralgias and joint swelling. Negative for myalgias, neck pain and neck stiffness.  Skin: Negative for rash.  Allergic/Immunologic: Negative for immunocompromised state.  Neurological: Negative for dizziness, weakness, light-headedness, numbness and headaches.      Allergies  Beta adrenergic blockers  Home Medications   Prior to Admission medications   Medication Sig Start Date End Date Taking? Authorizing Provider  aspirin EC 81 MG tablet Take 81 mg by mouth daily.   Yes Historical Provider, MD  carvedilol (COREG) 25 MG tablet Take 1 tablet (25 mg total) by mouth 2 (two) times daily with a meal. 10/07/13  Yes Laurey Moralealton S McLean, MD  enalapril (VASOTEC) 20 MG tablet Take 40 mg by mouth daily.   Yes Historical Provider, MD  hydrALAZINE (APRESOLINE) 50 MG tablet Take 0.5 tablets (25 mg total) by mouth 3 (three) times daily. 12/18/13  Yes Aundria RudAli B Cosgrove, NP  HYDROcodone-acetaminophen (NORCO/VICODIN) 5-325 MG per tablet  Take 1 tablet by mouth every 4 (four) hours as needed. 12/25/13  Yes Hope Orlene Och, NP  isosorbide mononitrate (IMDUR) 60 MG 24 hr tablet Take 1 tablet (60 mg total) by mouth daily. 10/07/13  Yes Laurey Morale, MD  potassium chloride SA (K-DUR,KLOR-CON) 20 MEQ tablet Take 1 tablet (20 mEq total) by mouth 2 (two) times daily. 11/28/13  Yes Aundria Rud, NP  simvastatin (ZOCOR) 40 MG tablet Take 1 tablet (40 mg total) by mouth at bedtime. 10/07/13  Yes Laurey Morale, MD  spironolactone (ALDACTONE) 25 MG tablet  Take 1 tablet (25 mg total) by mouth daily. 10/07/13  Yes Laurey Morale, MD  torsemide (DEMADEX) 20 MG tablet Take 2 tablets (40 mg total) by mouth 2 (two) times daily. 11/28/13  Yes Aundria Rud, NP   BP 115/71  Pulse 112  Temp(Src) 98.5 F (36.9 C) (Oral)  Resp 18  Ht 5' 9.5" (1.765 m)  Wt 320 lb (145.151 kg)  BMI 46.59 kg/m2  SpO2 97% Physical Exam  Nursing note and vitals reviewed. Constitutional: He appears well-developed and well-nourished. No distress.  HENT:  Head: Normocephalic and atraumatic.  Eyes: Conjunctivae are normal.  Neck: Neck supple.  Cardiovascular: Normal rate, regular rhythm and normal heart sounds.   Pulmonary/Chest: Effort normal. No respiratory distress. He has no wheezes. He has no rales.  Abdominal: Soft. Bowel sounds are normal. He exhibits no distension. There is no tenderness. There is no rebound.  Musculoskeletal: He exhibits no edema.  Edema noted to the left foot and left lower leg. Left knee appears to be normal, however difficult to assess given patient's body habitus. Tender to palpation over suprapatellar bursa. Pain with flexion, extension of the knee. Full range of motion. Dorsal pedal pulses intact. Negative anterior posterior drawer signs. No laxity with medial or lateral stress. Left calf nontender. Positive Homans sign  Neurological: He is alert.  Skin: Skin is warm and dry.    ED Course  Procedures (including critical care time) Labs Review Labs Reviewed - No data to display  Imaging Review Dg Knee Complete 4 Views Left  12/25/2013   CLINICAL DATA:  Anterior knee pain  EXAM: LEFT KNEE - COMPLETE 4+ VIEW  COMPARISON:  None.  FINDINGS: There is no evidence of fracture, dislocation, or joint effusion. There is no evidence of arthropathy or other focal bone abnormality. Soft tissues are unremarkable.  IMPRESSION: Negative.   Electronically Signed   By: Elige Ko   On: 12/25/2013 11:17     EKG Interpretation None      MDM   Final  diagnoses:  Knee pain, right     Pt with left knee pain. Just seen at [facility] two days ago, x-rays showing arthritis. Pt denies any new injuries. Joint is not warm, able to move wit with full ROM. Ambulatory. States norco not helping. There is some LE swelling on exam with positive homans sign. Venous doppler obtained and is negative. Home with close follow up. 15 percocets for pain, continue NSAIDs. Pt requested orthopedics referral.    Filed Vitals:   12/27/13 1000  BP: 115/71  Pulse: 112  Temp: 98.5 F (36.9 C)  TempSrc: Oral  Resp: 18  Height: 5' 9.5" (1.765 m)  Weight: 320 lb (145.151 kg)  SpO2: 97%     Leelynn Whetsel A Naimah Yingst, PA-C 12/27/13 1216

## 2013-12-29 ENCOUNTER — Inpatient Hospital Stay (HOSPITAL_COMMUNITY): Admission: RE | Admit: 2013-12-29 | Payer: Medicaid Other | Source: Ambulatory Visit

## 2013-12-30 ENCOUNTER — Ambulatory Visit (INDEPENDENT_AMBULATORY_CARE_PROVIDER_SITE_OTHER): Payer: Medicaid Other | Admitting: Internal Medicine

## 2013-12-30 ENCOUNTER — Encounter: Payer: Self-pay | Admitting: Internal Medicine

## 2013-12-30 ENCOUNTER — Inpatient Hospital Stay (HOSPITAL_COMMUNITY): Admission: RE | Admit: 2013-12-30 | Payer: Medicaid Other | Source: Ambulatory Visit

## 2013-12-30 VITALS — BP 110/66 | HR 84 | Temp 96.0°F

## 2013-12-30 DIAGNOSIS — I5020 Unspecified systolic (congestive) heart failure: Secondary | ICD-10-CM

## 2013-12-30 DIAGNOSIS — M109 Gout, unspecified: Secondary | ICD-10-CM

## 2013-12-30 DIAGNOSIS — N289 Disorder of kidney and ureter, unspecified: Secondary | ICD-10-CM

## 2013-12-30 DIAGNOSIS — I10 Essential (primary) hypertension: Secondary | ICD-10-CM

## 2013-12-30 DIAGNOSIS — N259 Disorder resulting from impaired renal tubular function, unspecified: Secondary | ICD-10-CM

## 2013-12-30 MED ORDER — PREDNISONE (PAK) 10 MG PO TABS: ORAL_TABLET | Freq: Every day | ORAL | Status: DC

## 2013-12-30 NOTE — Assessment & Plan Note (Signed)
No clinical signs of fluid overload.  Plans: F/U with cards as scheduled.

## 2013-12-30 NOTE — Assessment & Plan Note (Signed)
Recently worsening renal function. Suspect secondary to pre-renal etiology from diuresis. Recent Cr seems to starting to get better. Patient stated that cardiology is checking his labs.  Plans: Recommended to follow up with cardiology on 01/05/14 as scheduled.

## 2013-12-30 NOTE — Assessment & Plan Note (Signed)
Well controlled. Patient complaining of dizziness on standing but refused orthostatic vitals in the clinic. Patient stated that he is taking only Vasotec 20 mg.  Plans: Hold Spironolactone until cardiology appointment next week, and further instructions as per cards. Continue all the other medications.

## 2013-12-30 NOTE — Patient Instructions (Signed)
Take prednisone tablets as instructed. Follow up with cardiology as scheduled. Hold Spironolactone until you see your cardiologist and discuss with your cardiology when to resume the spironolactone.

## 2013-12-30 NOTE — Progress Notes (Signed)
Subjective:   Patient ID: Blanchard KelchJasper Neenan male   DOB: Aug 27, 1960 53 y.o.   MRN: [id number]  HPI: Mr.Januel Bascom LevelsFrazier is a 53 y.o. man with PMH significant for HTN, NICM with EF 45%, Native vessel CAD, CKD, Gout comes to the office with CC right great toe pain/swelling x 3 days.  Patient reports that the pain started about 3 days ago in his right great toe at MTP joint along with swelling. He reports that the pain in constant, 10/10 in severity, non-radiating, similar to "gout" pains he has had in the past, worsened by standing or walking, reduced slightly by percocet. He denies any fever, chills, body pains, nausea, vomiting, SOB, chest pain, swelling of legs.   He also reports that he has been feeling dizzy over the last 2 weeks when he stands up from sitting position. He reports that this has happened to him in the past when the cardiology reduced his Hydralazine. He reports taking only Vasotec 20 mg daily over the last 2 weeks, instead of 40 mg. Patient had an appointment with cardiology today but rescheduled it to 01/05/14 as he wanted to be seen for his gout today.   He denies any other complaints.  Past Medical History  Diagnosis Date  . Hypertension   . Hyperlipidemia   . CHF (congestive heart failure)     EF 50-55%    . Erectile dysfunction   . Obesity   . Gout   . CKD (chronic kidney disease)    Current Outpatient Prescriptions  Medication Sig Dispense Refill  . aspirin EC 81 MG tablet Take 81 mg by mouth daily.      . carvedilol (COREG) 25 MG tablet Take 1 tablet (25 mg total) by mouth 2 (two) times daily with a meal.  60 tablet  6  . enalapril (VASOTEC) 20 MG tablet Take 40 mg by mouth daily.      . hydrALAZINE (APRESOLINE) 50 MG tablet Take 0.5 tablets (25 mg total) by mouth 3 (three) times daily.  45 tablet  6  . HYDROcodone-acetaminophen (NORCO/VICODIN) 5-325 MG per tablet Take 1 tablet by mouth every 4 (four) hours as needed.  15 tablet  0  . isosorbide mononitrate (IMDUR)  60 MG 24 hr tablet Take 1 tablet (60 mg total) by mouth daily.  30 tablet  6  . oxyCODONE-acetaminophen (PERCOCET) 5-325 MG per tablet Take 1 tablet by mouth every 4 (four) hours as needed for severe pain.  15 tablet  0  . potassium chloride SA (K-DUR,KLOR-CON) 20 MEQ tablet Take 1 tablet (20 mEq total) by mouth 2 (two) times daily.  60 tablet  3  . simvastatin (ZOCOR) 40 MG tablet Take 1 tablet (40 mg total) by mouth at bedtime.  30 tablet  6  . spironolactone (ALDACTONE) 25 MG tablet Take 1 tablet (25 mg total) by mouth daily.  30 tablet  6  . torsemide (DEMADEX) 20 MG tablet Take 2 tablets (40 mg total) by mouth 2 (two) times daily.  120 tablet  3   No current facility-administered medications for this visit.   Family History  Problem Relation Age of Onset  . Hypertension Mother   . Hypertension Father   . Cancer Father     brother died of brain cancer; sister died of bone cancer  . Diabetes Sister   . Diabetes Brother   . Colon cancer Maternal Aunt   . Cardiomyopathy Neg Hx    History   Social History  .  Marital Status: Single    Spouse Name: N/A    Number of Children: N/A  . Years of Education: N/A   Social History Main Topics  . Smoking status: Never Smoker   . Smokeless tobacco: Never Used  . Alcohol Use: No  . Drug Use: No  . Sexual Activity: Not on file   Other Topics Concern  . Not on file   Social History Narrative   Financial assistance approved for 100% discount at [facility] and has [facility] card   [employer]  March 16, 2010 9:42 AM      Lives with mother.  Has a girlfriend   Review of Systems: Pertinent items are noted in HPI. Objective:  Physical Exam: Filed Vitals:   12/30/13 1027  BP: 110/66  Pulse: 84  Temp: 96 F (35.6 C)  TempSrc: Oral  SpO2: 96%   Patient is a obese individual, sitting in a wheel chair, well-developed and well-nourished and is mild pain. Patient is cooperative with exam.  Neck: No JVD. No carotid bruits heard. Cardiovascular:  RRR, S1 normal, S2 normal, no MRG Pulmonary/Chest: normal respiratory effort, CTAB, no wheezes, rales, or rhonchi Extremities: No pedal edema. Musculoskeletal: Mild swelling and erythema noted at the right first MTP joint. Mild warmth noted at the first MTP. Moderate to severe tenderness noted upon palpation over the first MTP,  First proximal phalanx and first distal metatarsal. ROM slightly reduced secondary to pain. Neurological: A&O x3    Assessment & Plan:

## 2013-12-30 NOTE — Assessment & Plan Note (Signed)
Symptoms and clinical signs suggestive of mild acute gout involving right first MTP joint. Reviewing the records on EPIC, it is not clear whether there was any evidence of crystals on synovial fluid analysis in the past. As the patient had several presentations of gout involving first MTP, along with elevated Uric acid level, the diagnosis of gout was made. Patient is currently not on any prophylactic therapy.  Plans: Start Prednisone therapy. NSAID's and Colchicine therapy not preferred given his recent worsening of his renal function. Recommended to follow up as needed.

## 2014-01-01 NOTE — Progress Notes (Signed)
Case discussed with Dr. Boggala at the time of the visit.  We reviewed the resident's history and exam and pertinent patient test results.  I agree with the assessment, diagnosis, and plan of care documented in the resident's note. 

## 2014-01-05 ENCOUNTER — Other Ambulatory Visit (HOSPITAL_COMMUNITY): Payer: Self-pay

## 2014-01-05 ENCOUNTER — Encounter (HOSPITAL_COMMUNITY): Payer: Self-pay

## 2014-01-05 ENCOUNTER — Ambulatory Visit (HOSPITAL_COMMUNITY)
Admission: RE | Admit: 2014-01-05 | Discharge: 2014-01-05 | Disposition: A | Discharge status: Home / Self Care | Payer: Medicaid Other | Source: Ambulatory Visit | Attending: Internal Medicine | Admitting: Internal Medicine

## 2014-01-05 VITALS — BP 131/89 | HR 101 | Resp 18 | Wt 314.5 lb

## 2014-01-05 DIAGNOSIS — N529 Male erectile dysfunction, unspecified: Secondary | ICD-10-CM | POA: Insufficient documentation

## 2014-01-05 DIAGNOSIS — E785 Hyperlipidemia, unspecified: Secondary | ICD-10-CM | POA: Diagnosis not present

## 2014-01-05 DIAGNOSIS — M109 Gout, unspecified: Secondary | ICD-10-CM | POA: Insufficient documentation

## 2014-01-05 DIAGNOSIS — Z6841 Body Mass Index (BMI) 40.0 and over, adult: Secondary | ICD-10-CM

## 2014-01-05 DIAGNOSIS — Z79899 Other long term (current) drug therapy: Secondary | ICD-10-CM | POA: Diagnosis not present

## 2014-01-05 DIAGNOSIS — I509 Heart failure, unspecified: Secondary | ICD-10-CM

## 2014-01-05 DIAGNOSIS — G4733 Obstructive sleep apnea (adult) (pediatric): Secondary | ICD-10-CM | POA: Insufficient documentation

## 2014-01-05 DIAGNOSIS — I129 Hypertensive chronic kidney disease with stage 1 through stage 4 chronic kidney disease, or unspecified chronic kidney disease: Secondary | ICD-10-CM | POA: Diagnosis not present

## 2014-01-05 DIAGNOSIS — E669 Obesity, unspecified: Secondary | ICD-10-CM | POA: Insufficient documentation

## 2014-01-05 DIAGNOSIS — Z09 Encounter for follow-up examination after completed treatment for conditions other than malignant neoplasm: Secondary | ICD-10-CM | POA: Insufficient documentation

## 2014-01-05 DIAGNOSIS — I5022 Chronic systolic (congestive) heart failure: Secondary | ICD-10-CM

## 2014-01-05 DIAGNOSIS — N259 Disorder resulting from impaired renal tubular function, unspecified: Secondary | ICD-10-CM

## 2014-01-05 DIAGNOSIS — Z0000 Encounter for general adult medical examination without abnormal findings: Secondary | ICD-10-CM

## 2014-01-05 DIAGNOSIS — N289 Disorder of kidney and ureter, unspecified: Secondary | ICD-10-CM | POA: Insufficient documentation

## 2014-01-05 DIAGNOSIS — N189 Chronic kidney disease, unspecified: Secondary | ICD-10-CM | POA: Diagnosis not present

## 2014-01-05 DIAGNOSIS — Z7982 Long term (current) use of aspirin: Secondary | ICD-10-CM | POA: Diagnosis not present

## 2014-01-05 DIAGNOSIS — I5020 Unspecified systolic (congestive) heart failure: Secondary | ICD-10-CM | POA: Diagnosis not present

## 2014-01-05 DIAGNOSIS — I428 Other cardiomyopathies: Secondary | ICD-10-CM | POA: Insufficient documentation

## 2014-01-05 DIAGNOSIS — E663 Overweight: Secondary | ICD-10-CM

## 2014-01-05 DIAGNOSIS — I10 Essential (primary) hypertension: Secondary | ICD-10-CM

## 2014-01-05 DIAGNOSIS — I499 Cardiac arrhythmia, unspecified: Secondary | ICD-10-CM

## 2014-01-05 LAB — BASIC METABOLIC PANEL
BUN: 36 mg/dL — ABNORMAL HIGH (ref 6–23)
CO2: 27 mEq/L (ref 19–32)
Calcium: 9.7 mg/dL (ref 8.4–10.5)
Chloride: 100 mEq/L (ref 96–112)
Creatinine, Ser: 1.85 mg/dL — ABNORMAL HIGH (ref 0.50–1.35)
GFR, EST AFRICAN AMERICAN: 46 mL/min — AB (ref >90)
GFR, EST NON AFRICAN AMERICAN: 40 mL/min — AB (ref >90)
Glucose, Bld: 100 mg/dL — ABNORMAL HIGH (ref 70–99)
POTASSIUM: 4.3 meq/L (ref 3.7–5.3)
SODIUM: 141 meq/L (ref 137–147)

## 2014-01-05 LAB — PRO B NATRIURETIC PEPTIDE: Pro B Natriuretic peptide (BNP): 884.8 pg/mL — ABNORMAL HIGH (ref 0–125)

## 2014-01-05 MED ORDER — ASPIRIN EC 81 MG PO TBEC: 81.0000 mg | DELAYED_RELEASE_TABLET | Freq: Every day | ORAL | Status: DC

## 2014-01-05 MED ORDER — CARVEDILOL 25 MG PO TABS: 25.0000 mg | ORAL_TABLET | Freq: Two times a day (BID) | ORAL | Status: DC

## 2014-01-05 MED ORDER — SPIRONOLACTONE 25 MG PO TABS: 25.0000 mg | ORAL_TABLET | Freq: Every day | ORAL | Status: DC

## 2014-01-05 MED ORDER — SIMVASTATIN 40 MG PO TABS: 40.0000 mg | ORAL_TABLET | Freq: Every day | ORAL | Status: DC

## 2014-01-05 MED ORDER — POTASSIUM CHLORIDE CRYS ER 20 MEQ PO TBCR: 20.0000 meq | EXTENDED_RELEASE_TABLET | Freq: Two times a day (BID) | ORAL | Status: DC

## 2014-01-05 MED ORDER — HYDRALAZINE HCL 50 MG PO TABS: 50.0000 mg | ORAL_TABLET | Freq: Three times a day (TID) | ORAL | Status: DC

## 2014-01-05 MED ORDER — ISOSORBIDE MONONITRATE ER 60 MG PO TB24: 60.0000 mg | ORAL_TABLET | Freq: Every day | ORAL | Status: DC

## 2014-01-05 MED ORDER — HYDRALAZINE HCL 50 MG PO TABS: 25.0000 mg | ORAL_TABLET | Freq: Three times a day (TID) | ORAL | Status: DC

## 2014-01-05 MED ORDER — TORSEMIDE 20 MG PO TABS: 40.0000 mg | ORAL_TABLET | Freq: Two times a day (BID) | ORAL | Status: DC

## 2014-01-05 NOTE — Patient Instructions (Signed)
Doing great.  Go home and call me with enalapril dose [phone]  Bring all your medications to every visit.  Will call with lab results.  F/U 6 weeks  Do the following things EVERYDAY: 1) Weigh yourself in the morning before breakfast. Write it down and keep it in a log. 2) Take your medicines as prescribed 3) Eat low salt foods-Limit salt (sodium) to 2000 mg per day.  4) Stay as active as you can everyday 5) Limit all fluids for the day to less than 2 liters 6)

## 2014-01-05 NOTE — Addendum Note (Signed)
Encounter addended by: Aundria Rud, NP on: 01/05/2014  7:44 PM<BR>     Documentation filed: Notes Section, Orders

## 2014-01-05 NOTE — Progress Notes (Addendum)
Patient ID: Eric Murillo, male   DOB: 12/30/1960, 53 y.o.   MRN: [id number]  PCP: [facility]  53 yo with history of obesity, hyperlipidemia, HTN, and nonischemic cardiomyopathy with CHF returns for cardiology followup.  He was diagnosed with severe OSA but has been unable to tolerate CPAP.  Most recent echo in 8/14 showed improvement in EF to 45%.  Follow up for Heart Failure: Last visit Cr elevated and was instructed to hold torsemide for 2 days and then to decrease to 40 mg BID and to stop metolazone. Called clinic with dizziness and told to decrease hydralazine to 25 mg to TID. Overall feeling pretty good. Denies SOB, orthopnea, CP or PND. Weight at home not accurate and says he is 178 lbs. Reports getting dizzy and light headed with standing after taking enalapril. He is not sure of the dosage. Trying to follow a low salt diet and drink less than 2L a day. Sometimes wears CPAP. Can walk about 100 yards before stopping d/t ankle pain.   Labs (5/14): K 3.7, creatinine 1.5 Labs (12/14): K 4.2, creatinine 1.27, LDL 84, HDL 31, TSH normal Labs (3/15): K 4.6, creatinine 1.29 Labs (3/17/145) K 3.9 Creatinine 1.44 Labs (11/14/13) K 4.2, creatinine 1.48          (12/05/13) K 4.8, creatinine 2.01   Past Medical History: 1. Nonischemic cardiomyopathy: Echo (3/12) with EF 30-35% and mildly dilated LV, diffuse LV hypokinesis, moderate MR, PA systolic pressure 55 mmHg.  Left and right heart cath (3/12): No angiographic CAD; mean RA 20, PA 76/41, mean PCWP 38, CI 2.1.  ANA, SPEP, and HIV negative.  TSH normal.  Denies drug abuse, heavy ETOH intake.  No family history of cardiomyopathy.  Cardiomyopathy may be due to long-standing HTN.  Echo (6/13): EF 35-40%, moderate LV dilation, moderate to severe LAE, PA systolic pressure 52 mmHg.  Unable to fit in magnet for cardiac MRI.  Echo (8/14) with EF 45%, moderately dilated LV, mild LVH, normal RV, PA systolic pressure 49 mmHg.  2. ERECTILE  DYSFUNCTION 3. HYPERTENSION  4. CKD 5. DYSLIPIDEMIA  6. Obesity 7. Hyperlipidemia 8. Gout 9. OSA: Severe on 6/13 sleep study.  10. Sialolithiasis    Family History: No history of cardiomyopathy No history of cancer among first degree relatives father - died of MI (11s)  mother - HTN  Social History: Lives with mother.  Unemployed, formerly a Film/video editor.  1 sexual partner (male), no contraceptives.  1 adult child. tobacco - none alcohol - none drugs - none  ROS: All systems reviewed and negative except as per HPI.   Current Outpatient Prescriptions  Medication Sig Dispense Refill  . aspirin EC 81 MG tablet Take 81 mg by mouth daily.      . carvedilol (COREG) 25 MG tablet Take 1 tablet (25 mg total) by mouth 2 (two) times daily with a meal.  60 tablet  6  . hydrALAZINE (APRESOLINE) 50 MG tablet Take 0.5 tablets (25 mg total) by mouth 3 (three) times daily.  45 tablet  6  . HYDROcodone-acetaminophen (NORCO/VICODIN) 5-325 MG per tablet Take 1 tablet by mouth every 4 (four) hours as needed.  15 tablet  0  . isosorbide mononitrate (IMDUR) 60 MG 24 hr tablet Take 1 tablet (60 mg total) by mouth daily.  30 tablet  6  . oxyCODONE-acetaminophen (PERCOCET) 5-325 MG per tablet Take 1 tablet by mouth every 4 (four) hours as needed for severe pain.  15 tablet  0  . potassium chloride SA (K-DUR,KLOR-CON) 20 MEQ tablet Take 1 tablet (20 mEq total) by mouth 2 (two) times daily.  60 tablet  3  . predniSONE (STERAPRED UNI-PAK) 10 MG tablet Take by mouth daily. Take 4 tablets once daily for 3 days, take 3 tablets once daily for 3 days, 2 tablets once daily for 3 days, 1 tablet once daily for three days and then stop.  30 tablet  0  . simvastatin (ZOCOR) 40 MG tablet Take 1 tablet (40 mg total) by mouth at bedtime.  30 tablet  6  . spironolactone (ALDACTONE) 25 MG tablet Take 1 tablet (25 mg total) by mouth daily.  30 tablet  6  . torsemide (DEMADEX) 20 MG tablet Take 2 tablets (40 mg total) by  mouth 2 (two) times daily.  120 tablet  3  . enalapril (VASOTEC) 20 MG tablet Take 20 mg by mouth daily.        No current facility-administered medications for this encounter.    Filed Vitals:   01/05/14 1421  BP: 131/89  Pulse: 101  Resp: 18  Weight: 314 lb 8 oz (142.656 kg)  SpO2: 98%   General: NAD, obese.  Neck:  Thick, JVP difficult to assess d/t body habitus but does not appear elevated, no thyromegaly or thyroid nodule.  Lungs: Clear to auscultation bilaterally with normal respiratory effort. CV: Nondisplaced PMI.  Heart regular S1/S2, +S4, no murmur.  No carotid bruit.   Abdomen: Soft, nontender, no hepatosplenomegaly, mildly distention.  Neurologic: Alert and oriented x 3.  Psych: Normal affect. Extremities: No clubbing or cyanosis. No edema  Assessment/Plan:  Systolic heart failure  Nonischemic cardiomyopathy, possibly due to long-standing poorly-controlled HTN. EF 45% (02/2013).  NYHA class II symptoms and volume status good. Will continue torsemide 40 mg BID. Check BMET and pro-BNP today. - On goal dose coreg 25 mg BID. - Continue hydralazine 25 mg TID and Imdur 60 mg daily. - He has had AKI in the past with change from furosemide to torsemide. Will recheck BMET today. - Patient reports that when he takes enalapril he has dizziness however not sure if he was taking it correctly. Have asked him to please go home and call us with his dosage. If Cr stable will try to start dose back at 20 mg daily. If continued dizziness will stop. - Continue spiro 25 mg daily.  - Reinforced the need and importance of daily weights, a low sodium diet, and fluid restriction (less than 2 L a day). Instructed to call the HF clinic if weight increases more than 3 lbs overnight or 5 lbs in a week. Marland Kitchen.  RENAL INSUFFICIENCY Check BMET today. AKI with change from torsemide to furosemide.  HYPERTENSION  Stable. Continue current medications.   OSA Reports sometimes wearing CPAP and encouraged him  to wear as much as possible.  Obesity Discussed in depth the need to lose weight and to cut back on junk food. Reinforced portion control and being more active.   F/U 6 weeks Ulla PotashCosgrove, Treylen Gibbs B NP-C  01/05/2014  Addendum: Reviewed labs and Cr remains elevated 1.85, K+ 4.3 and pro-BNP 884. Will not restart enalapril currently. Increase hydralazine back to 50 mg TID. Will need repeat ECHO at next visit.

## 2014-01-22 ENCOUNTER — Emergency Department (HOSPITAL_COMMUNITY): Payer: Medicaid Other

## 2014-01-22 ENCOUNTER — Emergency Department (HOSPITAL_COMMUNITY)
Admission: EM | Admit: 2014-01-22 | Discharge: 2014-01-22 | Disposition: A | Discharge status: Home / Self Care | Payer: Medicaid Other | Attending: Emergency Medicine | Admitting: Emergency Medicine

## 2014-01-22 ENCOUNTER — Encounter (HOSPITAL_COMMUNITY): Payer: Self-pay | Admitting: Emergency Medicine

## 2014-01-22 DIAGNOSIS — Y9389 Activity, other specified: Secondary | ICD-10-CM | POA: Insufficient documentation

## 2014-01-22 DIAGNOSIS — W010XXA Fall on same level from slipping, tripping and stumbling without subsequent striking against object, initial encounter: Secondary | ICD-10-CM | POA: Insufficient documentation

## 2014-01-22 DIAGNOSIS — Z79899 Other long term (current) drug therapy: Secondary | ICD-10-CM | POA: Insufficient documentation

## 2014-01-22 DIAGNOSIS — N189 Chronic kidney disease, unspecified: Secondary | ICD-10-CM | POA: Insufficient documentation

## 2014-01-22 DIAGNOSIS — S5000XA Contusion of unspecified elbow, initial encounter: Secondary | ICD-10-CM | POA: Insufficient documentation

## 2014-01-22 DIAGNOSIS — E669 Obesity, unspecified: Secondary | ICD-10-CM | POA: Insufficient documentation

## 2014-01-22 DIAGNOSIS — I509 Heart failure, unspecified: Secondary | ICD-10-CM | POA: Insufficient documentation

## 2014-01-22 DIAGNOSIS — Z87448 Personal history of other diseases of urinary system: Secondary | ICD-10-CM | POA: Insufficient documentation

## 2014-01-22 DIAGNOSIS — E785 Hyperlipidemia, unspecified: Secondary | ICD-10-CM | POA: Insufficient documentation

## 2014-01-22 DIAGNOSIS — S5001XA Contusion of right elbow, initial encounter: Secondary | ICD-10-CM

## 2014-01-22 DIAGNOSIS — Z8639 Personal history of other endocrine, nutritional and metabolic disease: Secondary | ICD-10-CM | POA: Insufficient documentation

## 2014-01-22 DIAGNOSIS — Z862 Personal history of diseases of the blood and blood-forming organs and certain disorders involving the immune mechanism: Secondary | ICD-10-CM | POA: Insufficient documentation

## 2014-01-22 DIAGNOSIS — Y9289 Other specified places as the place of occurrence of the external cause: Secondary | ICD-10-CM | POA: Insufficient documentation

## 2014-01-22 DIAGNOSIS — Z7982 Long term (current) use of aspirin: Secondary | ICD-10-CM | POA: Insufficient documentation

## 2014-01-22 DIAGNOSIS — I129 Hypertensive chronic kidney disease with stage 1 through stage 4 chronic kidney disease, or unspecified chronic kidney disease: Secondary | ICD-10-CM | POA: Insufficient documentation

## 2014-01-22 MED ORDER — PREDNISONE 50 MG PO TABS: 50.0000 mg | ORAL_TABLET | Freq: Every day | ORAL | Status: DC

## 2014-01-22 MED ORDER — HYDROCODONE-ACETAMINOPHEN 5-325 MG PO TABS: 1.0000 | ORAL_TABLET | Freq: Four times a day (QID) | ORAL | Status: DC | PRN

## 2014-01-22 MED ORDER — IBUPROFEN 800 MG PO TABS: 800.0000 mg | ORAL_TABLET | Freq: Three times a day (TID) | ORAL | Status: DC | PRN

## 2014-01-22 NOTE — Discharge Instructions (Signed)
Return here as needed.  Follow up with orthopedist provided.  Ice and heat to your elbow

## 2014-01-22 NOTE — ED Provider Notes (Signed)
Medical screening examination/treatment/procedure(s) were performed by non-physician practitioner and as supervising physician I was immediately available for consultation/collaboration.   EKG Interpretation None       Flint Melter, MD 01/22/14 2018

## 2014-01-22 NOTE — ED Notes (Signed)
[place]".   Patient had stated that he slid in some [place] and br[place] his fall with the R arm.  Patient complains of pain and swelling.

## 2014-01-22 NOTE — ED Provider Notes (Signed)
CSN: [id number]     Arrival date & time 01/22/14  0808 History   First MD Initiated Contact with Patient 01/22/14 [id number]     Chief Complaint  Patient presents with  . Fall     (Consider location/radiation/quality/duration/timing/severity/associated sxs/prior Treatment) HPI Patient presents to the emergency department with right elbow pain after [place]  Last night.  The patient, states, that he slid down a [place] and [place] and br[place] his fall with his right arm.  Patient has pain and swelling to the posterior aspect of his elbow.  Patient denies weakness, numbness, dizziness, nausea, vomiting, chest pain, shortness of breath, or syncope.  Patient, states he did not take any medications prior to arrival.  Palpation and movement make the pain, worse Past Medical History  Diagnosis Date  . Hypertension   . Hyperlipidemia   . CHF (congestive heart failure)     EF 50-55%    . Erectile dysfunction   . Obesity   . Gout   . CKD (chronic kidney disease)    Past Surgical History  Procedure Laterality Date  . Colonoscopy    . Salivary gland surgery  09/12/2012  . Cardiac catheterization  2012  . Submandibular gland excision Right 09/12/2012    Procedure: Removal Right Submandibular Larina Bras;  Surgeon: Serena Colonel, MD;  Location: [facility] OR;  Service: ENT;  Laterality: Right;   Family History  Problem Relation Age of Onset  . Hypertension Mother   . Hypertension Father   . Cancer Father     brother died of brain cancer; sister died of bone cancer  . Diabetes Sister   . Diabetes Brother   . Colon cancer Maternal Aunt   . Cardiomyopathy Neg Hx    History  Substance Use Topics  . Smoking status: Never Smoker   . Smokeless tobacco: Never Used  . Alcohol Use: No    Review of Systems All other systems negative except as documented in the HPI. All pertinent positives and negatives as reviewed in the HPI.    Allergies  Beta adrenergic blockers  Home Medications    Prior to Admission medications   Medication Sig Start Date End Date Taking? Authorizing Provider  aspirin EC 81 MG tablet Take 1 tablet (81 mg total) by mouth daily. 01/05/14  Yes Dolores Patty, MD  carvedilol (COREG) 25 MG tablet Take 1 tablet (25 mg total) by mouth 2 (two) times daily with a meal. 01/05/14  Yes Dolores Patty, MD  hydrALAZINE (APRESOLINE) 50 MG tablet Take 1 tablet (50 mg total) by mouth 3 (three) times daily. 01/05/14  Yes Aundria Rud, NP  isosorbide mononitrate (IMDUR) 60 MG 24 hr tablet Take 1 tablet (60 mg total) by mouth daily. 01/05/14  Yes Dolores Patty, MD  potassium chloride SA (K-DUR,KLOR-CON) 20 MEQ tablet Take 1 tablet (20 mEq total) by mouth 2 (two) times daily. 01/05/14  Yes Dolores Patty, MD  simvastatin (ZOCOR) 40 MG tablet Take 1 tablet (40 mg total) by mouth at bedtime. 01/05/14  Yes Dolores Patty, MD  spironolactone (ALDACTONE) 25 MG tablet Take 1 tablet (25 mg total) by mouth daily. 01/05/14  Yes Dolores Patty, MD  torsemide (DEMADEX) 20 MG tablet Take 2 tablets (40 mg total) by mouth 2 (two) times daily. 01/05/14  Yes Bevelyn Buckles Bensimhon, MD   BP 145/87  Pulse 99  Temp(Src) 98.3 F (36.8 C) (Oral)  Resp 20  Ht 5\' 9"  (1.753  m)  Wt 320 lb (145.151 kg)  BMI 47.23 kg/m2  SpO2 100% Physical Exam  Nursing note and vitals reviewed. Constitutional: He appears well-developed and well-nourished.  HENT:  Head: Normocephalic and atraumatic.  Eyes: Pupils are equal, round, and reactive to light.  Musculoskeletal:       Right elbow: He exhibits swelling. He exhibits normal range of motion, no effusion and no deformity. No tenderness found.       Arms: Skin: Skin is warm and dry.    ED Course  Procedures (including critical care time) Labs Review Labs Reviewed - No data to display  Imaging Review Dg Elbow Complete Right  01/22/2014   CLINICAL DATA:  FALL  EXAM: RIGHT ELBOW - COMPLETE 3+ VIEW  COMPARISON:  None.  FINDINGS: There  is no evidence of fracture, dislocation, or joint effusion. There is no evidence of arthropathy or other focal bone abnormality. Soft tissue swelling overlying the posterior aspect of the elbow. Very small area of increased density posterior peripheral soft tissues of the elbow. This may represent a radio opaque foreign body particularly considering its history of trauma. Alternatively the sequela remote trauma is also a diagnostic consideration. There does not appear to be a donor site within the osseous structures suggesting an avulsion fragment.  IMPRESSION: No acute osseus abnormalities. Soft tissue swelling posterior aspect of the elbow. A mild area of increased density within the soft tissues. Differential considerations on posttraumatic radiopaque foreign body, sequela of remote trauma.   Electronically Signed   By: Salome HolmesHector  Cooper M.D.   On: 01/22/2014 09:01   Dg Wrist Complete Right  01/22/2014   CLINICAL DATA:  Fall  EXAM: RIGHT WRIST - COMPLETE 3+ VIEW  COMPARISON:  None.  FINDINGS: There is no evidence of fracture or dislocation. There is no evidence of arthropathy or other focal bone abnormality. Soft tissues are unremarkable.  IMPRESSION: Negative.   Electronically Signed   By: Marlan Palauharles  Clark M.D.   On: 01/22/2014 08:55   We'll have the patient followup with orthopedics.  Told to return here as needed.  Ice and heat to the elbow    Carlyle DollyChristopher W Kinsley Holderman, PA-C 01/22/14 [phone]

## 2014-02-16 ENCOUNTER — Encounter (HOSPITAL_COMMUNITY): Payer: Self-pay

## 2014-02-16 ENCOUNTER — Telehealth (HOSPITAL_COMMUNITY): Payer: Self-pay

## 2014-02-16 ENCOUNTER — Ambulatory Visit (HOSPITAL_COMMUNITY)
Admission: RE | Admit: 2014-02-16 | Discharge: 2014-02-16 | Disposition: A | Discharge status: Home / Self Care | Payer: Medicaid Other | Source: Ambulatory Visit | Attending: Internal Medicine | Admitting: Internal Medicine

## 2014-02-16 VITALS — BP 132/83 | HR 102 | Resp 18 | Wt 319.5 lb

## 2014-02-16 DIAGNOSIS — I25110 Atherosclerotic heart disease of native coronary artery with unstable angina pectoris: Secondary | ICD-10-CM

## 2014-02-16 DIAGNOSIS — I129 Hypertensive chronic kidney disease with stage 1 through stage 4 chronic kidney disease, or unspecified chronic kidney disease: Secondary | ICD-10-CM | POA: Insufficient documentation

## 2014-02-16 DIAGNOSIS — I10 Essential (primary) hypertension: Secondary | ICD-10-CM

## 2014-02-16 DIAGNOSIS — E669 Obesity, unspecified: Secondary | ICD-10-CM | POA: Diagnosis not present

## 2014-02-16 DIAGNOSIS — I5022 Chronic systolic (congestive) heart failure: Secondary | ICD-10-CM | POA: Diagnosis not present

## 2014-02-16 DIAGNOSIS — I2510 Atherosclerotic heart disease of native coronary artery without angina pectoris: Secondary | ICD-10-CM

## 2014-02-16 DIAGNOSIS — N189 Chronic kidney disease, unspecified: Secondary | ICD-10-CM | POA: Insufficient documentation

## 2014-02-16 DIAGNOSIS — I509 Heart failure, unspecified: Secondary | ICD-10-CM | POA: Insufficient documentation

## 2014-02-16 DIAGNOSIS — G4733 Obstructive sleep apnea (adult) (pediatric): Secondary | ICD-10-CM

## 2014-02-16 DIAGNOSIS — I200 Unstable angina: Secondary | ICD-10-CM

## 2014-02-16 DIAGNOSIS — Z6841 Body Mass Index (BMI) 40.0 and over, adult: Secondary | ICD-10-CM

## 2014-02-16 LAB — BASIC METABOLIC PANEL
Anion gap: 14 (ref 5–15)
BUN: 18 mg/dL (ref 6–23)
CALCIUM: 9.1 mg/dL (ref 8.4–10.5)
CO2: 27 mEq/L (ref 19–32)
Chloride: 102 mEq/L (ref 96–112)
Creatinine, Ser: 1.53 mg/dL — ABNORMAL HIGH (ref 0.50–1.35)
GFR, EST AFRICAN AMERICAN: 58 mL/min — AB (ref >90)
GFR, EST NON AFRICAN AMERICAN: 50 mL/min — AB (ref >90)
GLUCOSE: 123 mg/dL — AB (ref 70–99)
POTASSIUM: 3.5 meq/L — AB (ref 3.7–5.3)
SODIUM: 143 meq/L (ref 137–147)

## 2014-02-16 LAB — PRO B NATRIURETIC PEPTIDE: Pro B Natriuretic peptide (BNP): 514.3 pg/mL — ABNORMAL HIGH (ref 0–125)

## 2014-02-16 NOTE — Patient Instructions (Signed)
Start taking medications as prescribed.  Do not run out of your medications.  Try to work on losing weight and being more active. Work on not eating so many breads and pastas and eat more protein.  Try to wear your CPAP more often.  Follow up in 6 weeks with ECHO  Do the following things EVERYDAY: 1) Weigh yourself in the morning before breakfast. Write it down and keep it in a log. 2) Take your medicines as prescribed 3) Eat low salt foods-Limit salt (sodium) to 2000 mg per day.  4) Stay as active as you can everyday 5) Limit all fluids for the day to less than 2 liters 6)

## 2014-02-16 NOTE — Telephone Encounter (Signed)
Instructed to take extra 40 meq potassium today.   Aware and agreeable.

## 2014-02-16 NOTE — Progress Notes (Addendum)
Patient ID: Eric Murillo, male   DOB: [birth year], 53 y.o.   MRN: [id number]  [facility]   53 yo with history of obesity, hyperlipidemia, HTN, and nonischemic cardiomyopathy with CHF returns for cardiology followup.  He was diagnosed with severe OSA but has been unable to tolerate CPAP.    Follow up for Heart Failure: Last visit Cr elevated and hydralazine was increased to 50 mg TID. Has been out of coreg for 4 days because he has not been to the pharmacy. Overall doing well. Denies SOB, PND, orthopnea or CP. Sometimes wearing CPAP. Weight at home 319 lbs. Occasional dizziness. Trying to follow a low salt diet and drinking less than 2L a day. Can walk about 1 1/2 flights of stairs before stopping d/t SOB and pain in ankles.   Labs (5/14): K 3.7, creatinine 1.5 Labs (12/14): K 4.2, creatinine 1.27, LDL 84, HDL 31, TSH normal Labs (3/15): K 4.6, creatinine 1.29 Labs (10/07/13) K 3.9 Creatinine 1.44 Labs (11/14/13) K 4.2, creatinine 1.48          (12/05/13) K 4.8, creatinine 2.01           (12/2013): K 4.3, creatinine 1.85, BUN 36, pro-BNP 884  Past Medical History: 1. Nonischemic cardiomyopathy: Echo (3/12) with EF 30-35% and mildly dilated LV, diffuse LV hypokinesis, moderate MR, PA systolic pressure 55 mmHg.  Left and right heart cath (3/12): No angiographic CAD; mean RA 20, PA 76/41, mean PCWP 38, CI 2.1.  ANA, SPEP, and HIV negative.  TSH normal.  Denies drug abuse, heavy ETOH intake.  No family history of cardiomyopathy.  Cardiomyopathy may be due to long-standing HTN.  Echo (6/13): EF 35-40%, moderate LV dilation, moderate to severe LAE, PA systolic pressure 52 mmHg.  Unable to fit in magnet for cardiac MRI.  Echo (8/14) with EF 45%, moderately dilated LV, mild LVH, normal RV, PA systolic pressure 49 mmHg.  2. ERECTILE DYSFUNCTION 3. HYPERTENSION  4. CKD 5. DYSLIPIDEMIA  6. Obesity 7. Hyperlipidemia 8. Gout 9. OSA: Severe on 6/13 sleep study.  10.  Sialolithiasis    Family History: No history of cardiomyopathy No history of cancer among first degree relatives father - died of MI 36(70s)  mother - HTN  Social History: Lives with mother.  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  1 sexual partner (male), no contraceptives.  1 adult child. tobacco - none alcohol - none drugs - none  ROS: All systems reviewed and negative except as per HPI.   Current Outpatient Prescriptions  Medication Sig Dispense Refill  . aspirin EC 81 MG tablet Take 1 tablet (81 mg total) by mouth daily.  30 tablet  3  . carvedilol (COREG) 25 MG tablet Take 1 tablet (25 mg total) by mouth 2 (two) times daily with a meal.  60 tablet  6  . hydrALAZINE (APRESOLINE) 50 MG tablet Take 1 tablet (50 mg total) by mouth 3 (three) times daily.  90 tablet  6  . isosorbide mononitrate (IMDUR) 60 MG 24 hr tablet Take 1 tablet (60 mg total) by mouth daily.  30 tablet  6  . potassium chloride SA (K-DUR,KLOR-CON) 20 MEQ tablet Take 1 tablet (20 mEq total) by mouth 2 (two) times daily.  60 tablet  3  . simvastatin (ZOCOR) 40 MG tablet Take 1 tablet (40 mg total) by mouth at bedtime.  30 tablet  6  . spironolactone (ALDACTONE) 25 MG tablet Take 1 tablet (25 mg total) by mouth daily.  30 tablet  6  . torsemide (DEMADEX) 20 MG tablet Take 2 tablets (40 mg total) by mouth 2 (two) times daily.  120 tablet  3  . predniSONE (DELTASONE) 50 MG tablet Take 50 mg by mouth as needed. Gout flares       No current facility-administered medications for this encounter.    Filed Vitals:   02/16/14 0840  BP: 132/83  Pulse: 102  Resp: 18  Weight: 319 lb 8 oz (144.924 kg)  SpO2: 98%   General: NAD, obese.  Neck:  Thick, JVP difficult to assess d/t body habitus but does not appear elevated, no thyromegaly or thyroid nodule.  Lungs: Clear to auscultation bilaterally with normal respiratory effort. CV: Nondisplaced PMI.  Heart regular S1/S2, +S4, no murmur.  No carotid bruit.   Abdomen: Soft,  nontender, no hepatosplenomegaly, mildly distention.  Neurologic: Alert and oriented x 3.  Psych: Normal affect. Extremities: No clubbing or cyanosis. No edema  EKG: SR with occasional PVCs 98 bpm  Assessment/Plan:  Chronic Systolic heart failure: NICM likely related to HTN, EF 45% (02/2013) - NYHA II symptoms and volume status appears at baseline. Will continue torsemide 40 mg BID.  - On goal dose BB 25 mg BID. Patient has been out of for a couple of days and we discussed the importance of not running out of medication and taking meds as prescribed. - Continue hydralazine 50 mg TID and Imdur 60 mg daily.   - He has had AKI in the past. Not current on enalapril. Will check BMET and pro-BNP today. Would like to get low dose ACE-I back on board. - Continue Spironolactone 25 mg daily. - Reinforced the need and importance of daily weights, a low sodium diet, and fluid restriction (less than 2 L a day). Instructed to call the HF clinic if weight increases more than 3 lbs overnight or 5 lbs in a week. Marland Kitchen  RENAL INSUFFICIENCY - check BMET today. Creatinine continues to trend up 1.4-1.85. HYPERTENSION  - As above patient has not been taking medications. Have asked him to start taking medications as prescribed.  OSA - Not very compliant with CPAP. Discussed the need to wear as much as possible and talked about switching to nasal pillows if he would think he would be more compliant.   Obesity - Discussed again the need to lose weight and to watch his portion sizes. Encouraged the need to be as active as possible.  F/U 6 weeks with ECHO Ulla Potash B NP-C  02/16/2014

## 2014-02-17 NOTE — Addendum Note (Signed)
Encounter addended by: Merlene Pulling, CCT on: 02/17/2014  8:16 AM<BR>     Documentation filed: Charges VN

## 2014-03-15 ENCOUNTER — Emergency Department (HOSPITAL_COMMUNITY)
Admission: EM | Admit: 2014-03-15 | Discharge: 2014-03-15 | Disposition: A | Discharge status: Home / Self Care | Payer: Medicaid Other | Attending: Emergency Medicine | Admitting: Emergency Medicine

## 2014-03-15 ENCOUNTER — Encounter (HOSPITAL_COMMUNITY): Payer: Self-pay | Admitting: Emergency Medicine

## 2014-03-15 DIAGNOSIS — Z9889 Other specified postprocedural states: Secondary | ICD-10-CM | POA: Diagnosis not present

## 2014-03-15 DIAGNOSIS — E785 Hyperlipidemia, unspecified: Secondary | ICD-10-CM | POA: Diagnosis not present

## 2014-03-15 DIAGNOSIS — M109 Gout, unspecified: Secondary | ICD-10-CM | POA: Insufficient documentation

## 2014-03-15 DIAGNOSIS — Z79899 Other long term (current) drug therapy: Secondary | ICD-10-CM | POA: Diagnosis not present

## 2014-03-15 DIAGNOSIS — M25579 Pain in unspecified ankle and joints of unspecified foot: Secondary | ICD-10-CM | POA: Insufficient documentation

## 2014-03-15 DIAGNOSIS — E669 Obesity, unspecified: Secondary | ICD-10-CM | POA: Diagnosis not present

## 2014-03-15 DIAGNOSIS — I129 Hypertensive chronic kidney disease with stage 1 through stage 4 chronic kidney disease, or unspecified chronic kidney disease: Secondary | ICD-10-CM | POA: Diagnosis not present

## 2014-03-15 DIAGNOSIS — N189 Chronic kidney disease, unspecified: Secondary | ICD-10-CM | POA: Insufficient documentation

## 2014-03-15 DIAGNOSIS — I509 Heart failure, unspecified: Secondary | ICD-10-CM | POA: Insufficient documentation

## 2014-03-15 DIAGNOSIS — Z7982 Long term (current) use of aspirin: Secondary | ICD-10-CM | POA: Insufficient documentation

## 2014-03-15 DIAGNOSIS — M10372 Gout due to renal impairment, left ankle and foot: Secondary | ICD-10-CM

## 2014-03-15 MED ORDER — PREDNISONE 50 MG PO TABS: 50.0000 mg | ORAL_TABLET | Freq: Every day | ORAL | Status: DC

## 2014-03-15 MED ORDER — HYDROCODONE-ACETAMINOPHEN 5-325 MG PO TABS: 1.0000 | ORAL_TABLET | Freq: Four times a day (QID) | ORAL | Status: DC | PRN

## 2014-03-15 NOTE — ED Provider Notes (Signed)
CSN: [id number]     Arrival date & time 03/15/14  1056 History  This chart was scribed for non-physician practitioner Ebbie Ridge, PA-C working with Layla Maw Ward, DO by Leone Payor, ED Scribe. This patient was seen in room TR07C/TR07C and the patient's care was started at 12:52 PM.    Chief Complaint  Patient presents with  . Foot Pain    The history is provided by the patient. No language interpreter was used.    HPI Comments: Eric Murillo is a 53 y.o. male who presents to the Emergency Department complaining of 1-2 days of gradual onset, gradually worsening, constant left great toe pain with associated swelling and redness. He denies recent injuries or trauma. He reports similar symptoms in the past when diagnosed with gout. He states the pain is worse with palpation and walking. He denies fever, numbness.   Past Medical History  Diagnosis Date  . Hypertension   . Hyperlipidemia   . CHF (congestive heart failure)     EF 50-55%    . Erectile dysfunction   . Obesity   . Gout   . CKD (chronic kidney disease)    Past Surgical History  Procedure Laterality Date  . Colonoscopy    . Salivary gland surgery  09/12/2012  . Cardiac catheterization  2012  . Submandibular gland excision Right 09/12/2012    Procedure: Removal Right Submandibular Larina Bras;  Surgeon: Serena Colonel, MD;  Location: [facility] OR;  Service: ENT;  Laterality: Right;   Family History  Problem Relation Age of Onset  . Hypertension Mother   . Hypertension Father   . Cancer Father     brother died of brain cancer; sister died of bone cancer  . Diabetes Sister   . Diabetes Brother   . Colon cancer Maternal Aunt   . Cardiomyopathy Neg Hx    History  Substance Use Topics  . Smoking status: Never Smoker   . Smokeless tobacco: Never Used  . Alcohol Use: No    Review of Systems  Constitutional: Negative for fever.  Musculoskeletal: Positive for arthralgias.  Neurological: Negative for weakness.      Allergies   Beta adrenergic blockers  Home Medications   Prior to Admission medications   Medication Sig Start Date End Date Taking? Authorizing Provider  aspirin EC 81 MG tablet Take 1 tablet (81 mg total) by mouth daily. 01/05/14   Dolores Patty, MD  carvedilol (COREG) 25 MG tablet Take 1 tablet (25 mg total) by mouth 2 (two) times daily with a meal. 01/05/14   Dolores Patty, MD  hydrALAZINE (APRESOLINE) 50 MG tablet Take 1 tablet (50 mg total) by mouth 3 (three) times daily. 01/05/14   Aundria Rud, NP  isosorbide mononitrate (IMDUR) 60 MG 24 hr tablet Take 1 tablet (60 mg total) by mouth daily. 01/05/14   Dolores Patty, MD  potassium chloride SA (K-DUR,KLOR-CON) 20 MEQ tablet Take 1 tablet (20 mEq total) by mouth 2 (two) times daily. 01/05/14   Dolores Patty, MD  predniSONE (DELTASONE) 50 MG tablet Take 50 mg by mouth as needed. Gout flares 01/22/14   Jamesetta Orleans Annalisa Colonna, PA-C  simvastatin (ZOCOR) 40 MG tablet Take 1 tablet (40 mg total) by mouth at bedtime. 01/05/14   Dolores Patty, MD  spironolactone (ALDACTONE) 25 MG tablet Take 1 tablet (25 mg total) by mouth daily. 01/05/14   Dolores Patty, MD  torsemide (DEMADEX) 20 MG tablet Take 2 tablets (40 mg total) by  mouth 2 (two) times daily. 01/05/14   Bevelyn Buckles Bensimhon, MD   BP 133/90  Pulse 76  Temp(Src) 99 F (37.2 C) (Oral)  Resp 18  SpO2 97% Physical Exam  Nursing note and vitals reviewed. Constitutional: He is oriented to person, place, and time. He appears well-developed and well-nourished.  HENT:  Head: Normocephalic and atraumatic.  Cardiovascular: Normal rate.   Pulmonary/Chest: Effort normal.  Abdominal: He exhibits no distension.  Musculoskeletal: He exhibits tenderness.  Tenderness along the proximal end of the left great toe with swelling and redness.   Neurological: He is alert and oriented to person, place, and time.  Skin: Skin is warm and dry.  Psychiatric: He has a normal mood and affect.    ED  Course  Procedures (including critical care time)  DIAGNOSTIC STUDIES: Oxygen Saturation is 97% on RA, adequate by my interpretation.    COORDINATION OF CARE: 12:53 PM Discussed treatment plan with pt at bedside and pt agreed to plan.  I personally performed the services described in this documentation, which was scribed in my presence. The recorded information has been reviewed and is accurate.   The patient will be treated for a gout flare, based on his previous history of gout and his physical exam findings.  Patient's best return here as needed.  Told to followup with his primary care Dr.   Carlyle Dolly, PA-C 03/15/14 1318  Carlyle Dolly, PA-C 03/15/14 1318

## 2014-03-15 NOTE — ED Notes (Signed)
Pt states L foot pain, pt reports issues with gout, states L foot feels sore and is throbbing in pain, 10/10 pain upon arrival. Able to wiggle digits, pedal pulses present. Denies recent injury. NAD.

## 2014-03-15 NOTE — ED Provider Notes (Signed)
Medical screening examination/treatment/procedure(s) were performed by non-physician practitioner and as supervising physician I was immediately available for consultation/collaboration.   EKG Interpretation None        Layla Maw Ward, DO 03/15/14 1333

## 2014-03-15 NOTE — Discharge Instructions (Signed)
Return here as needed.  Follow-up with your primary doctor. °

## 2014-03-15 NOTE — ED Notes (Signed)
Pt c/o left great toe pain that pt thinks is gout with hx of same

## 2014-03-20 ENCOUNTER — Ambulatory Visit (HOSPITAL_COMMUNITY)
Admission: RE | Admit: 2014-03-20 | Discharge: 2014-03-20 | Disposition: A | Discharge status: Home / Self Care | Payer: Medicaid Other | Source: Ambulatory Visit | Attending: Internal Medicine | Admitting: Internal Medicine

## 2014-03-20 ENCOUNTER — Encounter: Payer: Self-pay | Admitting: Internal Medicine

## 2014-03-20 ENCOUNTER — Ambulatory Visit (INDEPENDENT_AMBULATORY_CARE_PROVIDER_SITE_OTHER): Payer: Medicaid Other | Admitting: Internal Medicine

## 2014-03-20 VITALS — BP 119/79 | HR 73 | Temp 98.3°F | Ht 69.0 in | Wt 323.2 lb

## 2014-03-20 DIAGNOSIS — Z0000 Encounter for general adult medical examination without abnormal findings: Secondary | ICD-10-CM | POA: Diagnosis not present

## 2014-03-20 DIAGNOSIS — G4733 Obstructive sleep apnea (adult) (pediatric): Secondary | ICD-10-CM

## 2014-03-20 DIAGNOSIS — I428 Other cardiomyopathies: Secondary | ICD-10-CM | POA: Diagnosis not present

## 2014-03-20 DIAGNOSIS — I517 Cardiomegaly: Secondary | ICD-10-CM | POA: Diagnosis not present

## 2014-03-20 DIAGNOSIS — R079 Chest pain, unspecified: Secondary | ICD-10-CM | POA: Diagnosis not present

## 2014-03-20 DIAGNOSIS — Z23 Encounter for immunization: Secondary | ICD-10-CM

## 2014-03-20 DIAGNOSIS — I10 Essential (primary) hypertension: Secondary | ICD-10-CM | POA: Diagnosis not present

## 2014-03-20 DIAGNOSIS — M109 Gout, unspecified: Secondary | ICD-10-CM

## 2014-03-20 LAB — TROPONIN I

## 2014-03-20 MED ORDER — ALLOPURINOL 100 MG PO TABS: 100.0000 mg | ORAL_TABLET | Freq: Every day | ORAL | Status: DC

## 2014-03-20 NOTE — Assessment & Plan Note (Signed)
Reports not using CPAP due to claustrophobia.  He has a small mask because he hasn't tolerated it in the past.  He says he has more energy when he uses his CPAP. -Encouraged patient to use CPAP as much as possible.

## 2014-03-20 NOTE — Assessment & Plan Note (Addendum)
Denies leg swelling or SOB. Has trouble laying flat and sleeps on side.  Reports some chest tightness with walking.  Last cath in 2012 showed not CAD. Not currently taking ACE inhibitor due to dizziness, but cardiology plans to restart.  Chest tightness not reproducible on palpation and unlikely to be musculoskeletal.  Check EKG, which showed coarse afib/flutter. Called to discuss with his cardiologist Dr. Jearld Pies who agreed and thinks this is the most likely cause of his chest tightness with walking.  He is likely getting tachycardic with exercise and noticing it when walking.  His CHA2DS2-VASc is 2, so he would likely benefit from anticoagulation.  Troponin negative and chest x-ray shows no acute disease. -Dr. Jearld Pies of cardiology will arrange to see patient this week. -Will defer anticoagulation to cardiology. -Echo planned at cardiology appointment on September 8th. -Continue current medications until then: aspirin 81 mg daily, Coreg 25 mg BID, hydralazine 50 mg TID, Imdur 60 mg daily, simvastatin 40 mg daily, spironolactone 25 mg daily, torsemide 40 mg BID

## 2014-03-20 NOTE — Assessment & Plan Note (Addendum)
BP Readings from Last 3 Encounters:  03/15/14 121/91  02/16/14 132/83  01/22/14 123/72    Lab Results  Component Value Date   NA 143 02/16/2014   K 3.5* 02/16/2014   CREATININE 1.53* 02/16/2014    Assessment: Blood pressure control: controlled Progress toward BP goal:  at goal Comments: Not taking enalapril due to making him dizzy.  Plan: Medications:  continue current medications Other: Cardiology checking labs, Cr stable.

## 2014-03-20 NOTE — Patient Instructions (Addendum)
Thank you for coming to clinic today Eric Murillo.  General instructions: -We will let you know if any of the tests for your chest tightness are abnormal, otherwise you can follow up with your cardiologist to discuss stress testing. -I sent a prescription for allopurinol to you pharmacy. -Please make a follow up appointment to return to clinic in 6 months.  Please try to bring all your medicines next time. This helps [place] take good care of you and stops mistakes from medicines that could hurt you.   Allopurinol tablets What is this medicine? ALLOPURINOL (al oh PURE i nole) reduces the amount of uric acid the body makes. It is used to treat the symptoms of gout. It is also used to treat or prevent high uric acid levels that occur as a result of certain types of chemotherapy. This medicine may also help patients who frequently have kidney stones. This medicine may be used for other purposes; ask your health care provider or pharmacist if you have questions. COMMON BRAND NAME(S): Zyloprim What should I tell my health care provider before I take this medicine? They need to know if you have any of these conditions: -kidney or liver disease -an unusual or allergic reaction to allopurinol, other medicines, foods, dyes, or preservatives -pregnant or trying to get pregnant -breast feeding How should I use this medicine? Take this medicine by mouth with a glass of water. Follow the directions on the prescription label. If this medicine upsets your stomach, take it with food or milk. Take your doses at regular intervals. Do not take your medicine more often than directed. Talk to your pediatrician regarding the use of this medicine in children. Special care may be needed. While this drug may be prescribed for children as young as 6 years for selected conditions, precautions do apply. Overdosage: If you think you have taken too much of this medicine contact a poison control center or emergency room at  once. NOTE: This medicine is only for you. Do not share this medicine with others. What if I miss a dose? If you miss a dose, take it as soon as you can. If it is almost time for your next dose, take only that dose. Do not take double or extra doses. What may interact with this medicine? Do not take this medicine with the following medication: -didanosine, ddI This medicine may also interact with the following medications: -amoxicillin or ampicillin -azathioprine -certain medicines used to treat gout -certain types of diuretics -chlorpropamide -cyclosporine -dicumarol -mercaptopurine -tolbutamide -warfarin This list may not describe all possible interactions. Give your health care provider a list of all the medicines, herbs, non-prescription drugs, or dietary supplements you use. Also tell them if you smoke, drink alcohol, or use illegal drugs. Some items may interact with your medicine. What should I watch for while using this medicine? Visit your doctor or health care professional for regular checks on your progress. If you are taking this medicine to treat gout, you may not have less frequent attacks at first. Keep taking your medicine regularly and the attacks should get better within 2 to 6 weeks. Drink plenty of water (10 to 12 full glasses a day) while you are taking this medicine. This will help to reduce stomach upset and reduce the risk of getting gout or kidney stones. Call your doctor or health care professional at once if you get a skin rash together with chills, fever, sore throat, or nausea and vomiting, if you have blood in your  urine, or difficulty passing urine. Do not take vitamin C without asking your doctor or health care professional. Too much vitamin C can increase the chance of getting kidney stones. You may get drowsy or dizzy. Do not drive, use machinery, or do anything that needs mental alertness until you know how this drug affects you. Do not stand or sit up  quickly, especially if you are an older patient. This reduces the risk of dizzy or fainting spells. Alcohol can make you more drowsy and dizzy. Alcohol can also increase the chance of stomach problems and increase the amount of uric acid in your blood. Avoid alcoholic drinks. What side effects may I notice from receiving this medicine? Side effects that you should report to your doctor or health care professional as soon as possible: -allergic reactions like skin rash, itching or hives, swelling of the face, lips, or tongue -breathing problems -muscle aches or pains -redness, blistering, peeling or loosening of the skin, including inside the mouth Side effects that usually do not require medical attention (report to your doctor or health care professional if they continue or are bothersome): -changes in taste -diarrhea -indigestion -stomach pain or cramps This list may not describe all possible side effects. Call your doctor for medical advice about side effects. You may report side effects to FDA at 1-800-FDA-1088. Where should I keep my medicine? Keep out of the reach of children. Store at room temperature between 15 and 25 degrees C (59 and 77 degrees F). Protect from light and moisture. Throw away any unused medicine after the expiration date. NOTE: This sheet is a summary. It may not cover all possible information. If you have questions about this medicine, talk to your doctor, pharmacist, or health care provider.  2015, [employer]vier/Gold Standard. (2008-01-13 14:26:54)

## 2014-03-20 NOTE — Assessment & Plan Note (Addendum)
Pain in right toe has resolved, but he is interested in starting allopurinol to prevent future flares. -Start allopurinol 100 mg daily.

## 2014-03-20 NOTE — Progress Notes (Signed)
   Subjective:    Patient ID: Eric Murillo, male    DOB: September 08, 1960, 53 y.o.   MRN: [id number]  HPI Comments: Eric Murillo is a 53 year old man with PMH of obesity, HLD, HTN, gout, CKD and nonischemic cardiomyopathy with CHF (EF 45%) presenting for routine follow-up.  He reports tightness in his right chest for the last 2 days when he walks.  He says that he slipped on some [place] and had some pain that has resolved.  He thinks that this pain may be related to that.  Nothing makes the pain better or worse other than walking.  It starts after walking less than a 100 yards, and he felt it on his walk into the appointment today.  Please see problem-based charting for additional history.   Review of Systems  Constitutional: Negative for fever, chills, diaphoresis, activity change, appetite change, fatigue and unexpected weight change.  HENT: Negative for congestion, rhinorrhea, sinus pressure and sore throat.   Eyes: Negative for visual disturbance.  Respiratory: Positive for chest tightness. Negative for cough, choking, shortness of breath and wheezing.   Cardiovascular: Negative for palpitations and leg swelling.  Gastrointestinal: Negative for nausea, vomiting, diarrhea, constipation and abdominal distention.       Objective:   Physical Exam  Constitutional: He is oriented to person, place, and time. He appears well-developed and well-nourished. No distress.  Overweight.  HENT:  Head: Normocephalic and atraumatic.  Eyes: Conjunctivae and EOM are normal. Pupils are equal, round, and reactive to light. No scleral icterus.  Neck: No JVD present.  Cardiovascular: Normal rate and regular rhythm.  Exam reveals no gallop and no friction rub.   No murmur heard. Pulmonary/Chest: Effort normal and breath sounds normal. No respiratory distress. He has no wheezes. He has no rales. He exhibits no tenderness.  Abdominal: Soft. Bowel sounds are normal. He exhibits  no distension. There is no tenderness.  Musculoskeletal: He exhibits edema (Trace.).  Lymphadenopathy:    He has no cervical adenopathy.  Neurological: He is alert and oriented to person, place, and time. No cranial nerve deficit. Coordination normal.  Skin: Skin is warm and dry. He is not diaphoretic. No erythema.  Acanthosis nigricans and skin tags on neck.  Psychiatric: He has a normal mood and affect.        Assessment & Plan:  Please see problem-based assessment and plan.

## 2014-03-21 NOTE — Assessment & Plan Note (Signed)
Encourage patient to receive flu shot due to risk for morbidity with history of NICM.  He says he hasn't had the flu in several years, but ultimately agreed to receive the vaccine. -Flu vaccine administered in clinic today.

## 2014-03-23 ENCOUNTER — Telehealth (HOSPITAL_COMMUNITY): Payer: Self-pay | Admitting: Vascular Surgery

## 2014-03-24 ENCOUNTER — Encounter (HOSPITAL_COMMUNITY): Payer: Self-pay | Admitting: *Deleted

## 2014-03-24 ENCOUNTER — Encounter (HOSPITAL_COMMUNITY): Payer: Self-pay

## 2014-03-24 ENCOUNTER — Ambulatory Visit (HOSPITAL_COMMUNITY)
Admission: RE | Admit: 2014-03-24 | Discharge: 2014-03-24 | Disposition: A | Discharge status: Home / Self Care | Payer: Medicaid Other | Source: Ambulatory Visit | Attending: Cardiology | Admitting: Cardiology

## 2014-03-24 VITALS — BP 130/88 | HR 95 | Wt 317.8 lb

## 2014-03-24 DIAGNOSIS — I509 Heart failure, unspecified: Secondary | ICD-10-CM | POA: Insufficient documentation

## 2014-03-24 DIAGNOSIS — N183 Chronic kidney disease, stage 3 unspecified: Secondary | ICD-10-CM

## 2014-03-24 DIAGNOSIS — I4892 Unspecified atrial flutter: Secondary | ICD-10-CM | POA: Diagnosis not present

## 2014-03-24 DIAGNOSIS — I428 Other cardiomyopathies: Secondary | ICD-10-CM | POA: Insufficient documentation

## 2014-03-24 DIAGNOSIS — Z6841 Body Mass Index (BMI) 40.0 and over, adult: Secondary | ICD-10-CM | POA: Diagnosis not present

## 2014-03-24 DIAGNOSIS — I129 Hypertensive chronic kidney disease with stage 1 through stage 4 chronic kidney disease, or unspecified chronic kidney disease: Secondary | ICD-10-CM | POA: Diagnosis not present

## 2014-03-24 DIAGNOSIS — I5022 Chronic systolic (congestive) heart failure: Secondary | ICD-10-CM | POA: Diagnosis not present

## 2014-03-24 DIAGNOSIS — I499 Cardiac arrhythmia, unspecified: Secondary | ICD-10-CM

## 2014-03-24 DIAGNOSIS — I484 Atypical atrial flutter: Secondary | ICD-10-CM

## 2014-03-24 DIAGNOSIS — G4733 Obstructive sleep apnea (adult) (pediatric): Secondary | ICD-10-CM | POA: Insufficient documentation

## 2014-03-24 LAB — BASIC METABOLIC PANEL
Anion gap: 13 (ref 5–15)
BUN: 24 mg/dL — AB (ref 6–23)
CO2: 27 mEq/L (ref 19–32)
CREATININE: 1.9 mg/dL — AB (ref 0.50–1.35)
Calcium: 9 mg/dL (ref 8.4–10.5)
Chloride: 103 mEq/L (ref 96–112)
GFR calc non Af Amer: 39 mL/min — ABNORMAL LOW (ref >90)
GFR, EST AFRICAN AMERICAN: 45 mL/min — AB (ref >90)
GLUCOSE: 95 mg/dL (ref 70–99)
POTASSIUM: 4 meq/L (ref 3.7–5.3)
Sodium: 143 mEq/L (ref 137–147)

## 2014-03-24 LAB — PRO B NATRIURETIC PEPTIDE: Pro B Natriuretic peptide (BNP): 1979 pg/mL — ABNORMAL HIGH (ref 0–125)

## 2014-03-24 MED ORDER — APIXABAN 5 MG PO TABS: 5.0000 mg | ORAL_TABLET | Freq: Two times a day (BID) | ORAL | Status: DC

## 2014-03-24 NOTE — Patient Instructions (Signed)
Stop Aspirin  Start Eliquis 5 mg Twice daily   Labs today  Your physician has requested that you have a TEE/Cardioversion. During a TEE, sound waves are used to create images of your heart. It provides your doctor with information about the size and shape of your heart and how well your heart's chambers and valves are working. In this test, a transducer is attached to the end of a flexible tube that is guided down you throat and into your esophagus (the tube leading from your mouth to your stomach) to get a more detailed image of your heart. Once the TEE has determined that a blood clot is not present, the cardioversion begins. Electrical Cardioversion uses a jolt of electricity to your heart either through paddles or wired patches attached to your chest. This is a controlled, usually prescheduled, procedure. This procedure is done at the hospital and you are not awake during the procedure. You usually go home the day of the procedure. Please see the instruction sheet given to you today for more information.  SCHEDULED FOR 04/03/14 SEE INSTRUCTION SHEET.  You have been referred to EP   Your physician recommends that you schedule a follow-up appointment in: 2 weeks with Dr Shirlee Latch

## 2014-03-24 NOTE — Progress Notes (Signed)
Patient ID: Eric Murillo, male   DOB: 01-Feb-1961, 53 y.o.   MRN: [id number] PCP: Dr. Glenard Haring  53 yo with history of obesity, hyperlipidemia, HTN, and nonischemic cardiomyopathy with CHF returns for cardiology followup.  He was diagnosed with severe OSA but has been unable to tolerate CPAP.    He has been followed closely recently for volume overload and elevated creatinine.  He is currently off ACEi and taking hydralazine/Imdur. Weight is down 2 lbs.  Last week, he developed increased exertional dyspnea.  He is short of breath after walking 100 feet now (new).  He has tightness in the right upper chest also with 100 feet of ambulation.  He feels much worse overall.  He saw his PCP last week and was noted to be in atypical atrial flutter with controlled rate.  This is new.  He remains in atypical atrial flutter today.    ECG: atypical atrial flutter, LAFB  Labs (5/14): K 3.7, creatinine 1.5 Labs (12/14): K 4.2, creatinine 1.27, LDL 84, HDL 31, TSH normal Labs (3/15): K 4.6, creatinine 1.29 Labs (10/07/13) K 3.9 Creatinine 1.44 Labs (11/14/13) K 4.2, creatinine 1.48 Labs (12/05/13) K 4.8, creatinine 2.01  Labs (12/2013): K 4.3, creatinine 1.85, BUN 36, pro-BNP 884 Labs (7/15): K 3.5, creatinine 1.53  Past Medical History: 1. Nonischemic cardiomyopathy: Echo (3/12) with EF 30-35% and mildly dilated LV, diffuse LV hypokinesis, moderate MR, PA systolic pressure 55 mmHg.  Left and right heart cath (3/12): No angiographic CAD; mean RA 20, PA 76/41, mean PCWP 38, CI 2.1.  ANA, SPEP, and HIV negative.  TSH normal.  Denies drug abuse, heavy ETOH intake.  No family history of cardiomyopathy.  Cardiomyopathy may be due to long-standing HTN.  Echo (6/13): EF 35-40%, moderate LV dilation, moderate to severe LAE, PA systolic pressure 52 mmHg.  Unable to fit in magnet for cardiac MRI.  Echo (8/14) with EF 45%, moderately dilated LV, mild LVH, normal RV, PA systolic pressure 49 mmHg.  2. ERECTILE DYSFUNCTION 3.  HYPERTENSION  4. CKD 5. DYSLIPIDEMIA  6. Obesity 7. Hyperlipidemia 8. Gout 9. OSA: Severe on 6/13 sleep study.  10. Sialolithiasis    Family History: No history of cardiomyopathy No history of cancer among first degree relatives father - died of MI (77s)  mother - HTN  Social History: Lives with mother.  Unemployed, formerly a Film/video editor.  1 sexual partner (male), no contraceptives.  1 adult child. tobacco - none alcohol - none drugs - none  ROS: All systems reviewed and negative except as per HPI.   Current Outpatient Prescriptions  Medication Sig Dispense Refill  . allopurinol (ZYLOPRIM) 100 MG tablet Take 1 tablet (100 mg total) by mouth daily.  30 tablet  2  . carvedilol (COREG) 25 MG tablet Take 1 tablet (25 mg total) by mouth 2 (two) times daily with a meal.  60 tablet  6  . hydrALAZINE (APRESOLINE) 50 MG tablet Take 1 tablet (50 mg total) by mouth 3 (three) times daily.  90 tablet  6  . isosorbide mononitrate (IMDUR) 60 MG 24 hr tablet Take 1 tablet (60 mg total) by mouth daily.  30 tablet  6  . potassium chloride SA (K-DUR,KLOR-CON) 20 MEQ tablet Take 1 tablet (20 mEq total) by mouth 2 (two) times daily.  60 tablet  3  . predniSONE (DELTASONE) 50 MG tablet Take 1 tablet (50 mg total) by mouth daily.  7 tablet  0  . simvastatin (ZOCOR) 40 MG tablet Take 1 tablet (  40 mg total) by mouth at bedtime.  30 tablet  6  . spironolactone (ALDACTONE) 25 MG tablet Take 1 tablet (25 mg total) by mouth daily.  30 tablet  6  . torsemide (DEMADEX) 20 MG tablet Take 2 tablets (40 mg total) by mouth 2 (two) times daily.  120 tablet  3  . apixaban (ELIQUIS) 5 MG TABS tablet Take 1 tablet (5 mg total) by mouth 2 (two) times daily.  60 tablet  6   No current facility-administered medications for this encounter.    Filed Vitals:   03/24/14 0933  BP: 130/88  Pulse: 95  Weight: 317 lb 12.8 oz (144.153 kg)  SpO2: 96%   General: NAD, obese.  Neck:  Thick, JVP difficult to assess d/t  body habitus but does not appear elevated, no thyromegaly or thyroid nodule.  Lungs: Clear to auscultation bilaterally with normal respiratory effort. CV: Nondisplaced PMI.  Heart regular S1/S2, +S4, no murmur.  No carotid bruit.   Abdomen: Soft, nontender, no hepatosplenomegaly, mildly distention.  Neurologic: Alert and oriented x 3.  Psych: Normal affect. Extremities: No clubbing or cyanosis. No edema  EKG: atypical atrial flutter, LAFB  Assessment/Plan:  Chronic Systolic heart failure: Nonischemic cardiomyopathy, likely related to HTN.  Last echo in 8/14 with EF 45%.  Worse symptoms over the last week, NYHA class III.  I think that this corresponds to the onset of atrial flutter.  - Continue Coreg 25 mg bid, spironolactone 25 mg daily, and hydralazine 50 mg tid + Imdur 60 daily. - He does not appear significantly volume overloaded, continue torsemide 40 mg bid and KCl 20 bid.  - He has had AKI in the past. Not currently on enalapril.  Check BMET/BNP today. - I think that he will feel better back in NSR, will plan TEE-guided DCCV.  - Reinforced the need and importance of daily weights, a low sodium diet, and fluid restriction (less than 2 L a day). Instructed to call the HF clinic if weight increases more than 3 lbs overnight or 5 lbs in a week. .  CKD: Creatinine has been up with diuresis.  Repeat BMET today.  HYPERTENSION: BP controlled.  OSA: Not very compliant with CPAP. Discussed the need to wear as much as possible and talked about switching to nasal pillows if he would think he would be more compliant.   Obesity: Discussed again the need to lose weight and to watch his portion sizes. Encouraged the need to be as active as possible. Atrial flutter: Atypical atrial flutter.  CHADSVASC = 2 (HTN, CHF).  He has been considerably worse symptomatically over the last week, I think this corresponds to going into atrial flutter.  Weight is not up and he does not appear volume overloaded.  Rate  is controlled. I have going to start him on Eliquis 5 mg bid and will have him stop ASA.  I will plan for TEE-guided DCCV next week after he is at steady state on Eliquis.  I am also going to refer him to EP: if the atrial flutter is ablatable, we could likely take him off anticoagulation eventually.   Nzinga Ferran 03/24/2014   

## 2014-03-25 ENCOUNTER — Encounter (HOSPITAL_COMMUNITY): Payer: Self-pay | Admitting: Pharmacy Technician

## 2014-03-25 NOTE — Addendum Note (Signed)
Encounter addended by: Madison Albea M Roberts, CCT on: 03/25/2014  8:21 AM<BR>     Documentation filed: Charges VN

## 2014-03-26 ENCOUNTER — Other Ambulatory Visit: Payer: Self-pay | Admitting: *Deleted

## 2014-03-27 NOTE — Telephone Encounter (Signed)
Pt scheduled for DCCV/TEE Cpt code 92960/93312 icd 9 code 427.31 With pts current insurance- Medicaid No pre cert req;d

## 2014-03-31 ENCOUNTER — Encounter (HOSPITAL_COMMUNITY): Payer: Medicaid Other

## 2014-03-31 ENCOUNTER — Other Ambulatory Visit (HOSPITAL_COMMUNITY): Payer: Medicaid Other

## 2014-04-01 NOTE — Progress Notes (Signed)
INTERNAL MEDICINE TEACHING ATTENDING ADDENDUM - Earl Lagos, MD: I personally saw and evaluated Eric Murillo in this clinic visit in conjunction with the resident, Dr. Glenard Haring. I have discussed patient's plan of care with medical resident during this visit. I have confirmed the physical exam findings and have read and agree with the clinic note including the plan with the following addition: - Pt presenting with right sided CP - Found to be in coarse afib on EKG - Resident spoke with pt's cardiologist- to follow up with him this week - c/w current cardiac meds - will need ECHO at cardio office - Will likely need a/c. Will defer to cardio

## 2014-04-03 ENCOUNTER — Encounter (HOSPITAL_COMMUNITY)
Admission: RE | Disposition: A | Discharge status: Home / Self Care | Payer: Self-pay | Source: Ambulatory Visit | Attending: Cardiology

## 2014-04-03 ENCOUNTER — Encounter (HOSPITAL_COMMUNITY): Payer: Self-pay | Admitting: *Deleted

## 2014-04-03 ENCOUNTER — Ambulatory Visit (HOSPITAL_COMMUNITY)
Admission: RE | Admit: 2014-04-03 | Discharge: 2014-04-03 | Disposition: A | Discharge status: Home / Self Care | Payer: Medicaid Other | Source: Ambulatory Visit | Attending: Cardiology | Admitting: Cardiology

## 2014-04-03 ENCOUNTER — Ambulatory Visit (HOSPITAL_COMMUNITY): Payer: Medicaid Other | Admitting: Anesthesiology

## 2014-04-03 ENCOUNTER — Encounter (HOSPITAL_COMMUNITY): Payer: Medicaid Other | Admitting: Anesthesiology

## 2014-04-03 DIAGNOSIS — N529 Male erectile dysfunction, unspecified: Secondary | ICD-10-CM | POA: Diagnosis not present

## 2014-04-03 DIAGNOSIS — Z6841 Body Mass Index (BMI) 40.0 and over, adult: Secondary | ICD-10-CM | POA: Diagnosis not present

## 2014-04-03 DIAGNOSIS — K115 Sialolithiasis: Secondary | ICD-10-CM | POA: Insufficient documentation

## 2014-04-03 DIAGNOSIS — E785 Hyperlipidemia, unspecified: Secondary | ICD-10-CM | POA: Diagnosis not present

## 2014-04-03 DIAGNOSIS — E669 Obesity, unspecified: Secondary | ICD-10-CM | POA: Insufficient documentation

## 2014-04-03 DIAGNOSIS — I5022 Chronic systolic (congestive) heart failure: Secondary | ICD-10-CM | POA: Insufficient documentation

## 2014-04-03 DIAGNOSIS — I129 Hypertensive chronic kidney disease with stage 1 through stage 4 chronic kidney disease, or unspecified chronic kidney disease: Secondary | ICD-10-CM | POA: Diagnosis not present

## 2014-04-03 DIAGNOSIS — N189 Chronic kidney disease, unspecified: Secondary | ICD-10-CM | POA: Diagnosis not present

## 2014-04-03 DIAGNOSIS — G4733 Obstructive sleep apnea (adult) (pediatric): Secondary | ICD-10-CM | POA: Insufficient documentation

## 2014-04-03 DIAGNOSIS — I509 Heart failure, unspecified: Secondary | ICD-10-CM | POA: Diagnosis not present

## 2014-04-03 DIAGNOSIS — M109 Gout, unspecified: Secondary | ICD-10-CM | POA: Diagnosis not present

## 2014-04-03 DIAGNOSIS — I4892 Unspecified atrial flutter: Secondary | ICD-10-CM | POA: Insufficient documentation

## 2014-04-03 DIAGNOSIS — I059 Rheumatic mitral valve disease, unspecified: Secondary | ICD-10-CM

## 2014-04-03 DIAGNOSIS — I428 Other cardiomyopathies: Secondary | ICD-10-CM

## 2014-04-03 HISTORY — PX: TEE WITHOUT CARDIOVERSION: SHX5443

## 2014-04-03 HISTORY — PX: CARDIOVERSION: SHX1299

## 2014-04-03 SURGERY — ECHOCARDIOGRAM, TRANSESOPHAGEAL
Anesthesia: Monitor Anesthesia Care

## 2014-04-03 MED ORDER — PROPOFOL INFUSION 10 MG/ML OPTIME
INTRAVENOUS | Status: DC | PRN
  Administered 2014-04-03: 40 mL via INTRAVENOUS

## 2014-04-03 MED ORDER — PROPOFOL INFUSION 10 MG/ML OPTIME
INTRAVENOUS | Status: DC | PRN
  Administered 2014-04-03: 100 ug/kg/min via INTRAVENOUS
  Administered 2014-04-03: 125 ug/kg/min via INTRAVENOUS

## 2014-04-03 MED ORDER — SODIUM CHLORIDE 0.9 % IV SOLN
INTRAVENOUS | Status: DC
  Administered 2014-04-03: 500 mL via INTRAVENOUS
  Administered 2014-04-03: 11:00:00 via INTRAVENOUS

## 2014-04-03 MED ORDER — BUTAMBEN-TETRACAINE-BENZOCAINE 2-2-14 % EX AERO
INHALATION_SPRAY | CUTANEOUS | Status: DC | PRN
  Administered 2014-04-03: 2 via TOPICAL

## 2014-04-03 NOTE — Discharge Instructions (Signed)
Electrical Cardioversion Electrical cardioversion is the delivery of a jolt of electricity to change the rhythm of the heart. Sticky patches or metal paddles are placed on the chest to deliver the electricity from a device. This is done to restore a normal rhythm. A rhythm that is too fast or not regular keeps the heart from pumping well. Electrical cardioversion is done in an emergency if:   There is low or no blood pressure as a result of the heart rhythm.   Normal rhythm must be restored as fast as possible to protect the brain and heart from further damage.   It may save a life. Cardioversion may be done for heart rhythms that are not immediately life threatening, such as atrial fibrillation or flutter, in which:   The heart is beating too fast or is not regular.   Medicine to change the rhythm has not worked.   It is safe to wait in order to allow time for preparation.  Symptoms of the abnormal rhythm are bothersome.  The risk of stroke and other serious problems can be reduced. LET [facility] CARE PROVIDER KNOW ABOUT:   Any allergies you have.  All medicines you are taking, including vitamins, herbs, eye drops, creams, and over-the-counter medicines.  Previous problems you or members of your family have had with the use of anesthetics.   Any blood disorders you have.   Previous surgeries you have had.   Medical conditions you have. RISKS AND COMPLICATIONS  Generally, this is a safe procedure. However, problems can occur and include:   Breathing problems related to the anesthetic used.  A blood clot that breaks free and travels to other parts of your body. This could cause a stroke or other problems. The risk of this is lowered by use of blood-thinning medicine (anticoagulant) prior to the procedure.  Cardiac arrest (rare). BEFORE THE PROCEDURE   You may have tests to detect blood clots in your heart and to evaluate heart function.  You may start taking  anticoagulants so your blood does not clot as easily.   Medicines may be given to help stabilize your heart rate and rhythm. PROCEDURE  You will be given medicine through an IV tube to reduce discomfort and make you sleepy (sedative).   An electrical shock will be delivered. AFTER THE PROCEDURE Your heart rhythm will be watched to make sure it does not change.  Document Released: 06/30/2002 Document Revised: 11/24/2013 Document Reviewed: 01/22/2013 [facility] Patient Information 2015 [place], [place]. This information is not intended to replace advice given to you by your health care provider. Make sure you discuss any questions you have with your health care provider. Transesophageal Echocardiogram Transesophageal echocardiography (TEE) is a special type of test that produces images of the heart by using sound waves (echocardiogram). This type of echocardiography can obtain better images of the heart than standard echocardiography. TEE is done by passing a flexible tube down the esophagus. The heart is located in front of the esophagus. Because the heart and esophagus are close to one another, your health care provider can take very clear, detailed pictures of the heart via ultrasound waves. TEE may be done:  If your health care provider needs more information based on standard echocardiography findings.  If you had a stroke. This might have happened because a clot formed in your heart. TEE can visualize different areas of the heart and check for clots.  To check valve anatomy and function.  To check for infection on the  inside of your heart (endocarditis).  To evaluate the dividing wall (septum) of the heart and presence of a hole that did not close after birth (patent foramen ovale or atrial septal defect).  To help diagnose a tear in the wall of the aorta (aortic dissection).  During cardiac valve surgery. This allows the surgeon to assess the valve repair before closing the  chest.  During a variety of other cardiac procedures to guide positioning of catheters.  Sometimes before a cardioversion, which is a shock to convert heart rhythm back to normal. LET [facility] CARE PROVIDER KNOW ABOUT:   Any allergies you have.  All medicines you are taking, including vitamins, herbs, eye drops, creams, and over-the-counter medicines.  Previous problems you or members of your family have had with the use of anesthetics.  Any blood disorders you have.  Previous surgeries you have had.  Medical conditions you have.  Swallowing difficulties.  An esophageal obstruction. RISKS AND COMPLICATIONS  Generally, TEE is a safe procedure. However, as with any procedure, complications can occur. Possible complications include an esophageal tear (rupture). BEFORE THE PROCEDURE   Do not eat or drink for 6 hours before the procedure or as directed by your health care provider.  Arrange for someone to drive you home after the procedure. Do not drive yourself home. During the procedure, you will be given medicines that can continue to make you feel drowsy and can impair your reflexes.  An IV access tube will be started in the arm. PROCEDURE   A medicine to help you relax (sedative) will be given through the IV access tube.  A medicine may be sprayed or gargled to numb the back of the throat.  Your blood pressure, heart rate, and breathing (vital signs) will be monitored during the procedure.  The TEE probe is a long, flexible tube. The tip of the probe is placed into the back of the mouth, and you will be asked to swallow. This helps to pass the tip of the probe into the esophagus. Once the tip of the probe is in the correct area, your health care provider can take pictures of the heart.  TEE is usually not a painful procedure. You may feel the probe press against the back of the throat. The probe does not enter the trachea and does not affect your breathing. AFTER THE  PROCEDURE   You will be in bed, resting, until you have fully returned to consciousness.  When you first awaken, your throat may feel slightly sore and will probably still feel numb. This will improve slowly over time.  You will not be allowed to eat or drink until it is clear that the numbness has improved.  Once you have been able to drink, urinate, and sit on the edge of the bed without feeling sick to your stomach (nausea) or dizzy, you may be cleared to go home.  You should have a friend or family member with you for the next 24 hours after your procedure. Document Released: 09/30/2002 Document Revised: 07/15/2013 Document Reviewed: 01/09/2013 [facility] Patient Information 2015 [place], [place]. This information is not intended to replace advice given to you by your health care provider. Make sure you discuss any questions you have with your health care provider.

## 2014-04-03 NOTE — H&P (View-Only) (Signed)
Patient ID: Eric Murillo, male   DOB: 01-Feb-1961, 53 y.o.   MRN: [id number] PCP: Dr. Glenard Haring  53 yo with history of obesity, hyperlipidemia, HTN, and nonischemic cardiomyopathy with CHF returns for cardiology followup.  He was diagnosed with severe OSA but has been unable to tolerate CPAP.    He has been followed closely recently for volume overload and elevated creatinine.  He is currently off ACEi and taking hydralazine/Imdur. Weight is down 2 lbs.  Last week, he developed increased exertional dyspnea.  He is short of breath after walking 100 feet now (new).  He has tightness in the right upper chest also with 100 feet of ambulation.  He feels much worse overall.  He saw his PCP last week and was noted to be in atypical atrial flutter with controlled rate.  This is new.  He remains in atypical atrial flutter today.    ECG: atypical atrial flutter, LAFB  Labs (5/14): K 3.7, creatinine 1.5 Labs (12/14): K 4.2, creatinine 1.27, LDL 84, HDL 31, TSH normal Labs (3/15): K 4.6, creatinine 1.29 Labs (10/07/13) K 3.9 Creatinine 1.44 Labs (11/14/13) K 4.2, creatinine 1.48 Labs (12/05/13) K 4.8, creatinine 2.01  Labs (12/2013): K 4.3, creatinine 1.85, BUN 36, pro-BNP 884 Labs (7/15): K 3.5, creatinine 1.53  Past Medical History: 1. Nonischemic cardiomyopathy: Echo (3/12) with EF 30-35% and mildly dilated LV, diffuse LV hypokinesis, moderate MR, PA systolic pressure 55 mmHg.  Left and right heart cath (3/12): No angiographic CAD; mean RA 20, PA 76/41, mean PCWP 38, CI 2.1.  ANA, SPEP, and HIV negative.  TSH normal.  Denies drug abuse, heavy ETOH intake.  No family history of cardiomyopathy.  Cardiomyopathy may be due to long-standing HTN.  Echo (6/13): EF 35-40%, moderate LV dilation, moderate to severe LAE, PA systolic pressure 52 mmHg.  Unable to fit in magnet for cardiac MRI.  Echo (8/14) with EF 45%, moderately dilated LV, mild LVH, normal RV, PA systolic pressure 49 mmHg.  2. ERECTILE DYSFUNCTION 3.  HYPERTENSION  4. CKD 5. DYSLIPIDEMIA  6. Obesity 7. Hyperlipidemia 8. Gout 9. OSA: Severe on 6/13 sleep study.  10. Sialolithiasis    Family History: No history of cardiomyopathy No history of cancer among first degree relatives father - died of MI (53s)  mother - HTN  Social History: Lives with mother.  Unemployed, formerly a Film/video editor.  1 sexual partner (male), no contraceptives.  1 adult child. tobacco - none alcohol - none drugs - none  ROS: All systems reviewed and negative except as per HPI.   Current Outpatient Prescriptions  Medication Sig Dispense Refill  . allopurinol (ZYLOPRIM) 100 MG tablet Take 1 tablet (100 mg total) by mouth daily.  30 tablet  2  . carvedilol (COREG) 25 MG tablet Take 1 tablet (25 mg total) by mouth 2 (two) times daily with a meal.  60 tablet  6  . hydrALAZINE (APRESOLINE) 50 MG tablet Take 1 tablet (50 mg total) by mouth 3 (three) times daily.  90 tablet  6  . isosorbide mononitrate (IMDUR) 60 MG 24 hr tablet Take 1 tablet (60 mg total) by mouth daily.  30 tablet  6  . potassium chloride SA (K-DUR,KLOR-CON) 20 MEQ tablet Take 1 tablet (20 mEq total) by mouth 2 (two) times daily.  60 tablet  3  . predniSONE (DELTASONE) 50 MG tablet Take 1 tablet (50 mg total) by mouth daily.  7 tablet  0  . simvastatin (ZOCOR) 40 MG tablet Take 1 tablet (  40 mg total) by mouth at bedtime.  30 tablet  6  . spironolactone (ALDACTONE) 25 MG tablet Take 1 tablet (25 mg total) by mouth daily.  30 tablet  6  . torsemide (DEMADEX) 20 MG tablet Take 2 tablets (40 mg total) by mouth 2 (two) times daily.  120 tablet  3  . apixaban (ELIQUIS) 5 MG TABS tablet Take 1 tablet (5 mg total) by mouth 2 (two) times daily.  60 tablet  6   No current facility-administered medications for this encounter.    Filed Vitals:   03/24/14 0933  BP: 130/88  Pulse: 95  Weight: 317 lb 12.8 oz (144.153 kg)  SpO2: 96%   General: NAD, obese.  Neck:  Thick, JVP difficult to assess d/t  body habitus but does not appear elevated, no thyromegaly or thyroid nodule.  Lungs: Clear to auscultation bilaterally with normal respiratory effort. CV: Nondisplaced PMI.  Heart regular S1/S2, +S4, no murmur.  No carotid bruit.   Abdomen: Soft, nontender, no hepatosplenomegaly, mildly distention.  Neurologic: Alert and oriented x 3.  Psych: Normal affect. Extremities: No clubbing or cyanosis. No edema  EKG: atypical atrial flutter, LAFB  Assessment/Plan:  Chronic Systolic heart failure: Nonischemic cardiomyopathy, likely related to HTN.  Last echo in 8/14 with EF 45%.  Worse symptoms over the last week, NYHA class III.  I think that this corresponds to the onset of atrial flutter.  - Continue Coreg 25 mg bid, spironolactone 25 mg daily, and hydralazine 50 mg tid + Imdur 60 daily. - He does not appear significantly volume overloaded, continue torsemide 40 mg bid and KCl 20 bid.  - He has had AKI in the past. Not currently on enalapril.  Check BMET/BNP today. - I think that he will feel better back in NSR, will plan TEE-guided DCCV.  - Reinforced the need and importance of daily weights, a low sodium diet, and fluid restriction (less than 2 L a day). Instructed to call the HF clinic if weight increases more than 3 lbs overnight or 5 lbs in a week. .  CKD: Creatinine has been up with diuresis.  Repeat BMET today.  HYPERTENSION: BP controlled.  OSA: Not very compliant with CPAP. Discussed the need to wear as much as possible and talked about switching to nasal pillows if he would think he would be more compliant.   Obesity: Discussed again the need to lose weight and to watch his portion sizes. Encouraged the need to be as active as possible. Atrial flutter: Atypical atrial flutter.  CHADSVASC = 2 (HTN, CHF).  He has been considerably worse symptomatically over the last week, I think this corresponds to going into atrial flutter.  Weight is not up and he does not appear volume overloaded.  Rate  is controlled. I have going to start him on Eliquis 5 mg bid and will have him stop ASA.  I will plan for TEE-guided DCCV next week after he is at steady state on Eliquis.  I am also going to refer him to EP: if the atrial flutter is ablatable, we could likely take him off anticoagulation eventually.   [name] 03/24/2014

## 2014-04-03 NOTE — CV Procedure (Signed)
Procedure: TEE  Indication: Atrial flutter, symptomatic.    Sedation: Propofol per anesthesiology  Findings: Please see echo section for full report.  Mildly dilated LV with severe global hypokinesis, EF 25%. Mild to moderate MR.  Moderate LAE, no LAA thrombus.  Normal RV size with mildly decreased systolic function. No AS or AI.  Negative bubble study.   Will proceed with cardioversion, no LA thrombus.   Eric Murillo 04/03/2014 11:38 AM

## 2014-04-03 NOTE — Interval H&P Note (Signed)
History and Physical Interval Note:  04/03/2014 11:18 AM  Eric Murillo  has presented today for surgery, with the diagnosis of A-flutter  The various methods of treatment have been discussed with the patient and family. After consideration of risks, benefits and other options for treatment, the patient has consented to  Procedure(s): TRANSESOPHAGEAL ECHOCARDIOGRAM (TEE) (N/A) CARDIOVERSION (N/A) as a surgical intervention .  The patient's history has been reviewed, patient examined, no change in status, stable for surgery.  I have reviewed the patient's chart and labs.  Questions were answered to the patient's satisfaction.     Raigan Baria [employer]

## 2014-04-03 NOTE — Transfer of Care (Signed)
Immediate Anesthesia Transfer of Care Note  Patient: Eric Murillo  Procedure(s) Performed: Procedure(s): TRANSESOPHAGEAL ECHOCARDIOGRAM (TEE) (N/A) CARDIOVERSION (N/A)  Patient Location: PACU  Anesthesia Type:MAC  Level of Consciousness: awake and alert   Airway & Oxygen Therapy: Patient Spontanous Breathing and Patient connected to nasal cannula oxygen  Post-op Assessment: Report given to PACU RN, Post -op Vital signs reviewed and stable and Patient moving all extremities  Post vital signs: Reviewed and stable  Complications: No apparent anesthesia complications

## 2014-04-03 NOTE — Anesthesia Postprocedure Evaluation (Signed)
  Anesthesia Post-op Note  Patient: Eric Murillo  Procedure(s) Performed: Procedure(s): TRANSESOPHAGEAL ECHOCARDIOGRAM (TEE) (N/A) CARDIOVERSION (N/A)  Patient Location: Endoscopy Unit  Anesthesia Type:MAC  Level of Consciousness: awake and alert   Airway and Oxygen Therapy: Patient Spontanous Breathing  Post-op Pain: none  Post-op Assessment: Post-op Vital signs reviewed, Patient's Cardiovascular Status Stable, Respiratory Function Stable, Patent Airway, No signs of Nausea or vomiting and Pain level controlled  Post-op Vital Signs: Reviewed and stable  Last Vitals:  Filed Vitals:   04/03/14 1230  BP: 141/81  Pulse: 86  Temp:   Resp: 12    Complications: No apparent anesthesia complications

## 2014-04-03 NOTE — Anesthesia Preprocedure Evaluation (Signed)
Anesthesia Evaluation  Patient identified by MRN, date of birth, ID band Patient awake    Reviewed: Allergy & Precautions, H&P , NPO status , Patient's Chart, lab work & pertinent test results, reviewed documented beta blocker date and time   History of Anesthesia Complications Negative for: history of anesthetic complications  Airway Mallampati: II TM Distance: >3 FB Neck ROM: Full    Dental  (+) Teeth Intact   Pulmonary sleep apnea and Continuous Positive Airway Pressure Ventilation , neg COPD breath sounds clear to auscultation        Cardiovascular hypertension, Pt. on medications and Pt. on home beta blockers - angina+CHF - Past MI + dysrhythmias Atrial Fibrillation Rhythm:Irregular     Neuro/Psych negative neurological ROS  negative psych ROS   GI/Hepatic negative GI ROS,   Endo/Other  neg diabetesMorbid obesity  Renal/GU Renal InsufficiencyRenal disease     Musculoskeletal   Abdominal   Peds  Hematology negative hematology ROS (+)   Anesthesia Other Findings   Reproductive/Obstetrics                           Anesthesia Physical Anesthesia Plan  ASA: III  Anesthesia Plan: MAC   Post-op Pain Management:    Induction: Intravenous  Airway Management Planned: Natural Airway  Additional Equipment: None  Intra-op Plan:   Post-operative Plan:   Informed Consent: I have reviewed the patients History and Physical, chart, labs and discussed the procedure including the risks, benefits and alternatives for the proposed anesthesia with the patient or authorized representative who has indicated his/her understanding and acceptance.   Dental advisory given  Plan Discussed with: CRNA and Surgeon  Anesthesia Plan Comments:         Anesthesia Quick Evaluation

## 2014-04-03 NOTE — Procedures (Signed)
Electrical Cardioversion Procedure Note Eric Murillo [id number] [birth year]  Procedure: Electrical Cardioversion Indications:  Atrial Flutter.  He has been on Eliquis since last week and had no LAA thrombus on TEE.   Procedure Details Consent: Risks of procedure as well as the alternatives and risks of each were explained to the (patient/caregiver).  Consent for procedure obtained. Time Out: Verified patient identification, verified procedure, site/side was marked, verified correct patient position, special equipment/implants available, medications/allergies/relevent history reviewed, required imaging and test results available.  Performed  Patient placed on cardiac monitor, pulse oximetry, supplemental oxygen as necessary.  Sedation given: Propofol Pacer pads placed anterior and posterior chest.  Cardioverted 1 time(s).  Cardioverted at 200J.  Evaluation Findings: Post procedure EKG shows: NSR with PACs.   Complications: None Patient did tolerate procedure well.   [name] 04/03/2014, 11:49 AM

## 2014-04-06 ENCOUNTER — Encounter (HOSPITAL_COMMUNITY): Payer: Self-pay | Admitting: Cardiology

## 2014-04-07 ENCOUNTER — Encounter (HOSPITAL_COMMUNITY): Payer: Medicaid Other

## 2014-04-07 ENCOUNTER — Ambulatory Visit (HOSPITAL_COMMUNITY)
Admit: 2014-04-07 | Discharge: 2014-04-07 | Disposition: A | Discharge status: Home / Self Care | Payer: Medicaid Other | Source: Ambulatory Visit | Attending: Cardiology | Admitting: Cardiology

## 2014-04-07 ENCOUNTER — Encounter (HOSPITAL_COMMUNITY): Payer: Self-pay

## 2014-04-07 VITALS — BP 116/72 | HR 80 | Wt 326.8 lb

## 2014-04-07 DIAGNOSIS — N529 Male erectile dysfunction, unspecified: Secondary | ICD-10-CM | POA: Diagnosis not present

## 2014-04-07 DIAGNOSIS — I428 Other cardiomyopathies: Secondary | ICD-10-CM | POA: Insufficient documentation

## 2014-04-07 DIAGNOSIS — N183 Chronic kidney disease, stage 3 unspecified: Secondary | ICD-10-CM

## 2014-04-07 DIAGNOSIS — Z79899 Other long term (current) drug therapy: Secondary | ICD-10-CM | POA: Diagnosis not present

## 2014-04-07 DIAGNOSIS — I440 Atrioventricular block, first degree: Secondary | ICD-10-CM | POA: Diagnosis not present

## 2014-04-07 DIAGNOSIS — I484 Atypical atrial flutter: Secondary | ICD-10-CM

## 2014-04-07 DIAGNOSIS — I4891 Unspecified atrial fibrillation: Secondary | ICD-10-CM | POA: Diagnosis not present

## 2014-04-07 DIAGNOSIS — I4892 Unspecified atrial flutter: Secondary | ICD-10-CM | POA: Diagnosis not present

## 2014-04-07 DIAGNOSIS — Z9119 Patient's noncompliance with other medical treatment and regimen: Secondary | ICD-10-CM | POA: Insufficient documentation

## 2014-04-07 DIAGNOSIS — I129 Hypertensive chronic kidney disease with stage 1 through stage 4 chronic kidney disease, or unspecified chronic kidney disease: Secondary | ICD-10-CM | POA: Insufficient documentation

## 2014-04-07 DIAGNOSIS — E785 Hyperlipidemia, unspecified: Secondary | ICD-10-CM | POA: Diagnosis not present

## 2014-04-07 DIAGNOSIS — E669 Obesity, unspecified: Secondary | ICD-10-CM | POA: Insufficient documentation

## 2014-04-07 DIAGNOSIS — I509 Heart failure, unspecified: Secondary | ICD-10-CM | POA: Diagnosis not present

## 2014-04-07 DIAGNOSIS — I059 Rheumatic mitral valve disease, unspecified: Secondary | ICD-10-CM | POA: Diagnosis not present

## 2014-04-07 DIAGNOSIS — M109 Gout, unspecified: Secondary | ICD-10-CM | POA: Diagnosis not present

## 2014-04-07 DIAGNOSIS — I5022 Chronic systolic (congestive) heart failure: Secondary | ICD-10-CM | POA: Insufficient documentation

## 2014-04-07 DIAGNOSIS — G4733 Obstructive sleep apnea (adult) (pediatric): Secondary | ICD-10-CM | POA: Diagnosis not present

## 2014-04-07 DIAGNOSIS — Z91199 Patient's noncompliance with other medical treatment and regimen due to unspecified reason: Secondary | ICD-10-CM | POA: Insufficient documentation

## 2014-04-07 DIAGNOSIS — K115 Sialolithiasis: Secondary | ICD-10-CM | POA: Insufficient documentation

## 2014-04-07 DIAGNOSIS — Z8249 Family history of ischemic heart disease and other diseases of the circulatory system: Secondary | ICD-10-CM | POA: Insufficient documentation

## 2014-04-07 DIAGNOSIS — N189 Chronic kidney disease, unspecified: Secondary | ICD-10-CM | POA: Diagnosis not present

## 2014-04-07 MED ORDER — TORSEMIDE 20 MG PO TABS: ORAL_TABLET | ORAL | Status: DC

## 2014-04-07 NOTE — Progress Notes (Signed)
Patient ID: Eric Murillo, Eric Murillo [birth year], 53 y.o.   MRN: [id number] PCP: Dr. Glenard Haring  53 yo with history of obesity, hyperlipidemia, HTN, and nonischemic cardiomyopathy with CHF returns for cardiology followup.  He was diagnosed with severe OSA but has been unable to tolerate CPAP.    He has been followed closely recently for volume overload and elevated creatinine.  He is currently off ACEi and taking hydralazine/Imdur. Weight is down 2 lbs.  Prior to last appointment, he developed increased exertional dyspnea.  He is short of breath after walking 100 feet now (new).  He has tightness in the right upper chest also with 100 feet of ambulation.  He was found to be in atypical atrial flutter with controlled rate.  This was new.  The atrial flutter persisted.  I started him on Eliquis 5 mg bid and given significantly worsened symptoms, I brought him in for TEE-guided DCCV last week.  TEE showed that EF was down to 25-30% with mild to moderate MR.  He was successfully cardioverted to NSR and remains in NSR today.   [name] feels some better but still feels like he has too much fluid. Weight is up about 9 lbs since last appointment (but he is wearing heavy boots today so not sure if this is truly accurate).  He can walk about 250 feet now before becoming short of breath.  No further chest tightness.  Creatinine remains elevated at 1.9 when last checked.     ECG: NSR, 1st degree AV block, right axis deviation, poor anterior R wave progression  Labs (5/14): K 3.7, creatinine 1.5 Labs (12/14): K 4.2, creatinine 1.27, LDL 84, HDL 31, TSH normal Labs (3/15): K 4.6, creatinine 1.29 Labs (10/07/13) K 3.9 Creatinine 1.44 Labs (11/14/13) K 4.2, creatinine 1.48 Labs (12/05/13) K 4.8, creatinine 2.01  Labs (12/2013): K 4.3, creatinine 1.85, BUN 36, pro-BNP 884 Labs (7/15): K 3.5, creatinine 1.53 Labs (9/15): K 4, creatinine 1.9.    Past Medical History: 1. Nonischemic cardiomyopathy: Echo (3/12) with EF  30-35% and mildly dilated LV, diffuse LV hypokinesis, moderate MR, PA systolic pressure 55 mmHg.  Left and right heart cath (3/12): No angiographic CAD; mean RA 20, PA 76/41, mean PCWP 38, CI 2.1.  ANA, SPEP, and HIV negative.  TSH normal.  Denies drug abuse, heavy ETOH intake.  No family history of cardiomyopathy.  Cardiomyopathy may be due to long-standing HTN.  Echo (6/13): EF 35-40%, moderate LV dilation, moderate to severe LAE, PA systolic pressure 52 mmHg.  Unable to fit in magnet for cardiac MRI.  Echo (8/14) with EF 45%, moderately dilated LV, mild LVH, normal RV, PA systolic pressure 49 mmHg. TEE (9/15) with EF 25-30% with global hypokinesis, mild to moderate MR, mildly decreased RV systolic function.  2. ERECTILE DYSFUNCTION 3. HYPERTENSION  4. CKD 5. DYSLIPIDEMIA  6. Obesity 7. Hyperlipidemia 8. Gout 9. OSA: Severe on 6/13 sleep study.  10. Sialolithiasis   Family History: No history of cardiomyopathy No history of cancer among first degree relatives father - died of MI (10s)  mother - HTN  Social History: Lives girlfriend.  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 tobacco - none alcohol - none drugs - none  ROS: All systems reviewed and negative except as per HPI.   Current Outpatient Prescriptions  Medication Sig Dispense Refill  . allopurinol (ZYLOPRIM) 100 MG tablet Take 1 tablet (100 mg total) by mouth daily.  30 tablet  2  . apixaban (  ELIQUIS) 5 MG TABS tablet Take 5 mg by mouth 2 (two) times daily.      . carvedilol (COREG) 25 MG tablet Take 1 tablet (25 mg total) by mouth 2 (two) times daily with a meal.  60 tablet  6  . hydrALAZINE (APRESOLINE) 50 MG tablet Take 1 tablet (50 mg total) by mouth 3 (three) times daily.  90 tablet  6  . isosorbide mononitrate (IMDUR) 60 MG 24 hr tablet Take 1 tablet (60 mg total) by mouth daily.  30 tablet  6  . potassium chloride SA (K-DUR,KLOR-CON) 20 MEQ tablet Take 1 tablet (20 mEq total) by mouth 2 (two) times daily.  60  tablet  3  . simvastatin (ZOCOR) 40 MG tablet Take 1 tablet (40 mg total) by mouth at bedtime.  30 tablet  6  . spironolactone (ALDACTONE) 25 MG tablet Take 1 tablet (25 mg total) by mouth daily.  30 tablet  6  . torsemide (DEMADEX) 20 MG tablet Take 3 tabs in AM and 2 tabs in PM  120 tablet  3   No current facility-administered medications for this encounter.    Filed Vitals:   04/07/14 0830  BP: 116/72  Pulse: 80  Weight: 326 lb 12.8 oz (148.236 kg)  SpO2: 98%   General: NAD, obese.  Neck:  Thick, JVP 8-9 cm, no thyromegaly or thyroid nodule.  Lungs: Clear to auscultation bilaterally with normal respiratory effort. CV: Nondisplaced PMI.  Heart regular S1/S2, no murmur.  No carotid bruit.   Abdomen: Soft, nontender, no hepatosplenomegaly, mildly distention.  Neurologic: Alert and oriented x 3.  Psych: Normal affect. Extremities: No clubbing or cyanosis. 1+ edema 1/2 up lower legs bilaterally.   Assessment/Plan:  Chronic Systolic heart failure: Nonischemic cardiomyopathy, likely related to HTN initially.  Echo in 8/14 with EF 45% but EF down to 25-30% on TEE while in atrial flutter.  This may be due to a component of tachycardia-mediated cardiomyopathy.  He is now back in NSR with some improvement in symptoms but still NYHA class III.  He is volume overloaded and weight is up.  - Continue Coreg 25 mg bid, spironolactone 25 mg daily, and hydralazine 50 mg tid + Imdur 60 daily. - Increase torsemide to 60 qam, 40 qpm and continue current KCl.  - Continue to stay off enalapril with elevated creatinine.  Will check BMET/BNP in 1 week.   - Reinforced the need and importance of daily weights, a low sodium diet, and fluid restriction (less than 2 L a day). Instructed to call the HF clinic if weight increases more than 3 lbs overnight or 5 lbs in a week. .  CKD: Creatinine has been up with diuresis.  Recheck BMET in 1 week.  HYPERTENSION: BP controlled.  OSA: Not very compliant with CPAP.  Discussed the need to wear as much as possible and talked about switching to nasal pillows if he would think he would be more compliant.   Obesity: Discussed again the need to lose weight and to watch his portion sizes. Encouraged the need to be as active as possible. Atrial flutter: Atypical atrial flutter, paroxysmal.  He is now back in NSR after DCCV.  CHADSVASC = 2 (HTN, CHF).  He was very symptomatic while in atrial flutter, some improvement back in NSR.  He has only had 1 documented episode.  He will be seeing EP: if the atrial flutter is ablatable, we could likely take him off anticoagulation eventually (he does not want to  continue anticoagulation) though I am concerned that with his cardiomyopathy, weight, and OSA he is at considerable risk for future atrial fibrillation.   [name] 04/07/2014

## 2014-04-07 NOTE — Patient Instructions (Signed)
Increase Torsemide to 3 tabs (60 mg) in AM and 2 tabs (40 mg) in PM  Your physician recommends that you schedule a follow-up appointment in: 1 week with labs (bmet, bnp, cbc)

## 2014-04-08 NOTE — Addendum Note (Signed)
Encounter addended by: Excell Seltzer, NT on: 04/08/2014  7:59 AM<BR>     Documentation filed: Charges VN

## 2014-04-13 ENCOUNTER — Institutional Professional Consult (permissible substitution): Payer: Medicaid Other | Admitting: Internal Medicine

## 2014-04-13 NOTE — Progress Notes (Signed)
Patient ID: Eric Murillo, male   DOB: 1960/11/05, 53 y.o.   MRN: [id number] PCP: Dr. Glenard Haring  53 yo with history of obesity, hyperlipidemia, HTN, atrial flutter (s/p TEE DC-CV 04/03/14), NICM, chronic systolic HF and severe OSA but unable to tolerate CPAP.    Follow up for Heart Failure: Last visit volume overloaded and increased torsemide to 60 mg q am and 40 mg q pm. Overall feeling good. Denies palpitations, PND, orthopnea, SOB or CP. Mild LE edema. Weight is unchanged and is 326-327 lbs at home. Can walk about 100 yards before getting SOB. Patient is paranoid that his heart is going to go back into abnormal rhythm. Trying to follow a low salt diet and drinking less than 2L a day.   Labs (5/14): K 3.7, creatinine 1.5 Labs (12/14): K 4.2, creatinine 1.27, LDL 84, HDL 31, TSH normal Labs (3/15): K 4.6, creatinine 1.29 Labs (10/07/13) K 3.9 Creatinine 1.44 Labs (11/14/13) K 4.2, creatinine 1.48 Labs (12/05/13) K 4.8, creatinine 2.01  Labs (12/2013): K 4.3, creatinine 1.85, BUN 36, pro-BNP 884 Labs (7/15): K 3.5, creatinine 1.53 Labs (9/15): K 4, creatinine 1.9.    Past Medical History: 1. Nonischemic cardiomyopathy: Echo (3/12) with EF 30-35% and mildly dilated LV, diffuse LV hypokinesis, moderate MR, PA systolic pressure 55 mmHg.  Left and right heart cath (3/12): No angiographic CAD; mean RA 20, PA 76/41, mean PCWP 38, CI 2.1.  ANA, SPEP, and HIV negative.  TSH normal.  Denies drug abuse, heavy ETOH intake.  No family history of cardiomyopathy.  Cardiomyopathy may be due to long-standing HTN.  Echo (6/13): EF 35-40%, moderate LV dilation, moderate to severe LAE, PA systolic pressure 52 mmHg.  Unable to fit in magnet for cardiac MRI.  Echo (8/14) with EF 45%, moderately dilated LV, mild LVH, normal RV, PA systolic pressure 49 mmHg. TEE (9/15) with EF 25-30% with global hypokinesis, mild to moderate MR, mildly decreased RV systolic function.  2. ERECTILE DYSFUNCTION 3. HYPERTENSION  4. CKD 5.  DYSLIPIDEMIA  6. Obesity 7. Hyperlipidemia 8. Gout 9. OSA: Severe on 6/13 sleep study.  10. Sialolithiasis 11. Atypical Atrial Flutter: TEE-DC-CV (04/03/14) which was successful;    Family History: No history of cardiomyopathy No history of cancer among first degree relatives father - died of MI (69s)  mother - HTN  Social History: Lives girlfriend.  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 tobacco - none alcohol - none drugs - none  ROS: All systems reviewed and negative except as per HPI.   Current Outpatient Prescriptions  Medication Sig Dispense Refill  . allopurinol (ZYLOPRIM) 100 MG tablet Take 1 tablet (100 mg total) by mouth daily.  30 tablet  2  . apixaban (ELIQUIS) 5 MG TABS tablet Take 5 mg by mouth 2 (two) times daily.      . carvedilol (COREG) 25 MG tablet Take 1 tablet (25 mg total) by mouth 2 (two) times daily with a meal.  60 tablet  6  . hydrALAZINE (APRESOLINE) 50 MG tablet Take 1 tablet (50 mg total) by mouth 3 (three) times daily.  90 tablet  6  . isosorbide mononitrate (IMDUR) 60 MG 24 hr tablet Take 1 tablet (60 mg total) by mouth daily.  30 tablet  6  . potassium chloride SA (K-DUR,KLOR-CON) 20 MEQ tablet Take 1 tablet (20 mEq total) by mouth 2 (two) times daily.  60 tablet  3  . simvastatin (ZOCOR) 40 MG tablet Take 1 tablet (40 mg total) by  mouth at bedtime.  30 tablet  6  . spironolactone (ALDACTONE) 25 MG tablet Take 1 tablet (25 mg total) by mouth daily.  30 tablet  6  . torsemide (DEMADEX) 20 MG tablet Take 3 tabs in AM and 2 tabs in PM  120 tablet  3   No current facility-administered medications for this encounter.    Filed Vitals:   04/14/14 0839  BP: 138/84  Pulse: 86  Weight: 326 lb 4 oz (147.986 kg)  SpO2: 97%   General: NAD, obese.  Neck:  Thick, JVP difficult to assess d/t body habitus but appears 8 cm, no thyromegaly or thyroid nodule.  Lungs: Clear to auscultation bilaterally with normal respiratory effort. CV: Nondisplaced  PMI.  Heart regular S1/S2, no murmur.  No carotid bruit.   Abdomen: Soft, nontender, no hepatosplenomegaly, mildly distention.  Neurologic: Alert and oriented x 3.  Psych: Normal affect. Extremities: No clubbing or cyanosis. Trace bilateral edema 1/2 up lower legs bilaterally.   Assessment/Plan:  Chronic Systolic heart failure: Nonischemic cardiomyopathy, likely related to HTN initially.  Echo in 8/14 with EF 45% but EF down to 25-30% on TEE while in atrial flutter (03/2014). This may be due to a component of tachycardia-mediated cardiomyopathy.   - NYHA II-III symptoms and volume status difficult to assess but still appears mildly elevated. Weight is unchanged with increase in diuretics. Will give one dose of 2.5 mg of metolazone today and continue torsemide 60 mg q am and 40 mg q pm. Recheck BMET and proBNP today.  - Continue Coreg 25 mg bid, spironolactone 25 mg daily, hydralazine 50 mg TID and Imdur 60 mg daily.    - Reinforced the need and importance of daily weights, a low sodium diet, and fluid restriction (less than 2 L a day). Instructed to call the HF clinic if weight increases more than 3 lbs overnight or 5 lbs in a week. .  CKD: Creatinine has been up with diuresis.  Recheck BMET and pro-BNP today. If Creatinine remains elevated and weight is up may need to have RHC to assess hemodynamics.  HYPERTENSION: BP elevated. He has not taken his medications today. Will continue current medications and have asked him to take them before next visit.  OSA: Not very compliant with CPAP. Discussed the need to wear as much as possible. He is interested in possibly switching to nasal pillows.  Obesity: Discussed again the need to lose weight and to watch his portion sizes. Encouraged the need to be as active as possible. Discussed looking into the Atkins diet or the [facility] Diet.  Atrial flutter: Atypical atrial flutter, paroxysmal in early September.  He is now back in NSR after DCCV.  CHADSVASC = 2  (HTN, CHF).  He was very symptomatic while in atrial flutter, some improvement back in NSR.  He has only had 1 documented episode.  He will be seeing EP: if the atrial flutter is ablatable, we could likely take him off anticoagulation eventually (he does not want to continue anticoagulation) though I am concerned that with his cardiomyopathy, weight, and OSA he is at considerable risk for future atrial fibrillation.    F/U 6 weeks Aundria Rud 04/14/2014

## 2014-04-14 ENCOUNTER — Encounter (HOSPITAL_COMMUNITY): Payer: Self-pay

## 2014-04-14 ENCOUNTER — Ambulatory Visit (HOSPITAL_COMMUNITY)
Admission: RE | Admit: 2014-04-14 | Discharge: 2014-04-14 | Disposition: A | Discharge status: Home / Self Care | Payer: Medicaid Other | Source: Ambulatory Visit | Attending: Internal Medicine | Admitting: Internal Medicine

## 2014-04-14 VITALS — BP 138/84 | HR 86 | Wt 326.2 lb

## 2014-04-14 DIAGNOSIS — I4892 Unspecified atrial flutter: Secondary | ICD-10-CM | POA: Insufficient documentation

## 2014-04-14 DIAGNOSIS — Z79899 Other long term (current) drug therapy: Secondary | ICD-10-CM | POA: Diagnosis not present

## 2014-04-14 DIAGNOSIS — I10 Essential (primary) hypertension: Secondary | ICD-10-CM

## 2014-04-14 DIAGNOSIS — M109 Gout, unspecified: Secondary | ICD-10-CM | POA: Insufficient documentation

## 2014-04-14 DIAGNOSIS — I484 Atypical atrial flutter: Secondary | ICD-10-CM

## 2014-04-14 DIAGNOSIS — G4733 Obstructive sleep apnea (adult) (pediatric): Secondary | ICD-10-CM | POA: Insufficient documentation

## 2014-04-14 DIAGNOSIS — Z713 Dietary counseling and surveillance: Secondary | ICD-10-CM | POA: Diagnosis not present

## 2014-04-14 DIAGNOSIS — Z8249 Family history of ischemic heart disease and other diseases of the circulatory system: Secondary | ICD-10-CM | POA: Diagnosis not present

## 2014-04-14 DIAGNOSIS — I129 Hypertensive chronic kidney disease with stage 1 through stage 4 chronic kidney disease, or unspecified chronic kidney disease: Secondary | ICD-10-CM | POA: Diagnosis not present

## 2014-04-14 DIAGNOSIS — N529 Male erectile dysfunction, unspecified: Secondary | ICD-10-CM | POA: Insufficient documentation

## 2014-04-14 DIAGNOSIS — N183 Chronic kidney disease, stage 3 unspecified: Secondary | ICD-10-CM | POA: Diagnosis not present

## 2014-04-14 DIAGNOSIS — I4891 Unspecified atrial fibrillation: Secondary | ICD-10-CM | POA: Diagnosis not present

## 2014-04-14 DIAGNOSIS — K115 Sialolithiasis: Secondary | ICD-10-CM | POA: Insufficient documentation

## 2014-04-14 DIAGNOSIS — R609 Edema, unspecified: Secondary | ICD-10-CM | POA: Diagnosis present

## 2014-04-14 DIAGNOSIS — I509 Heart failure, unspecified: Secondary | ICD-10-CM | POA: Diagnosis not present

## 2014-04-14 DIAGNOSIS — I5022 Chronic systolic (congestive) heart failure: Secondary | ICD-10-CM

## 2014-04-14 DIAGNOSIS — E785 Hyperlipidemia, unspecified: Secondary | ICD-10-CM | POA: Insufficient documentation

## 2014-04-14 DIAGNOSIS — I428 Other cardiomyopathies: Secondary | ICD-10-CM | POA: Insufficient documentation

## 2014-04-14 DIAGNOSIS — E669 Obesity, unspecified: Secondary | ICD-10-CM | POA: Insufficient documentation

## 2014-04-14 LAB — BASIC METABOLIC PANEL
Anion gap: 12 (ref 5–15)
BUN: 15 mg/dL (ref 6–23)
CHLORIDE: 102 meq/L (ref 96–112)
CO2: 26 mEq/L (ref 19–32)
CREATININE: 1.42 mg/dL — AB (ref 0.50–1.35)
Calcium: 9.3 mg/dL (ref 8.4–10.5)
GFR calc non Af Amer: 55 mL/min — ABNORMAL LOW (ref >90)
GFR, EST AFRICAN AMERICAN: 64 mL/min — AB (ref >90)
Glucose, Bld: 126 mg/dL — ABNORMAL HIGH (ref 70–99)
Potassium: 4.7 mEq/L (ref 3.7–5.3)
Sodium: 140 mEq/L (ref 137–147)

## 2014-04-14 LAB — PRO B NATRIURETIC PEPTIDE: Pro B Natriuretic peptide (BNP): 814.3 pg/mL — ABNORMAL HIGH (ref 0–125)

## 2014-04-14 MED ORDER — TORSEMIDE 20 MG PO TABS: ORAL_TABLET | ORAL | Status: DC

## 2014-04-14 MED ORDER — HYDRALAZINE HCL 50 MG PO TABS: 50.0000 mg | ORAL_TABLET | Freq: Three times a day (TID) | ORAL | Status: DC

## 2014-04-14 NOTE — Patient Instructions (Signed)
Continue current medications.   Will call about lab results.   Try to be more active and watch your portion sizes.   Call any issues especially if your weight starts tending up or you are more short of breath.   F/U 6 weeks  Do the following things EVERYDAY: 1) Weigh yourself in the morning before breakfast. Write it down and keep it in a log. 2) Take your medicines as prescribed 3) Eat low salt foods-Limit salt (sodium) to 2000 mg per day.  4) Stay as active as you can everyday 5) Limit all fluids for the day to less than 2 liters 6)

## 2014-04-17 ENCOUNTER — Encounter (HOSPITAL_COMMUNITY): Payer: Self-pay | Admitting: Emergency Medicine

## 2014-04-17 ENCOUNTER — Emergency Department (HOSPITAL_COMMUNITY)
Admission: EM | Admit: 2014-04-17 | Discharge: 2014-04-17 | Disposition: A | Discharge status: Home / Self Care | Payer: Medicaid Other | Attending: Emergency Medicine | Admitting: Emergency Medicine

## 2014-04-17 DIAGNOSIS — N189 Chronic kidney disease, unspecified: Secondary | ICD-10-CM | POA: Diagnosis not present

## 2014-04-17 DIAGNOSIS — I509 Heart failure, unspecified: Secondary | ICD-10-CM | POA: Diagnosis not present

## 2014-04-17 DIAGNOSIS — Z79899 Other long term (current) drug therapy: Secondary | ICD-10-CM | POA: Insufficient documentation

## 2014-04-17 DIAGNOSIS — E785 Hyperlipidemia, unspecified: Secondary | ICD-10-CM | POA: Diagnosis not present

## 2014-04-17 DIAGNOSIS — Z7901 Long term (current) use of anticoagulants: Secondary | ICD-10-CM | POA: Diagnosis not present

## 2014-04-17 DIAGNOSIS — M79609 Pain in unspecified limb: Secondary | ICD-10-CM | POA: Insufficient documentation

## 2014-04-17 DIAGNOSIS — M722 Plantar fascial fibromatosis: Secondary | ICD-10-CM

## 2014-04-17 DIAGNOSIS — I129 Hypertensive chronic kidney disease with stage 1 through stage 4 chronic kidney disease, or unspecified chronic kidney disease: Secondary | ICD-10-CM | POA: Insufficient documentation

## 2014-04-17 DIAGNOSIS — Z87448 Personal history of other diseases of urinary system: Secondary | ICD-10-CM | POA: Insufficient documentation

## 2014-04-17 DIAGNOSIS — E669 Obesity, unspecified: Secondary | ICD-10-CM | POA: Insufficient documentation

## 2014-04-17 MED ORDER — TRAMADOL HCL 50 MG PO TABS: 50.0000 mg | ORAL_TABLET | Freq: Four times a day (QID) | ORAL | Status: DC | PRN

## 2014-04-17 NOTE — ED Provider Notes (Signed)
CSN: [id number]     Arrival date & time 04/17/14  0330 History   First MD Initiated Contact with Patient 04/17/14 [phone]     Chief Complaint  Patient presents with  . Foot Pain     (Consider location/radiation/quality/duration/timing/severity/associated sxs/prior Treatment) Patient is a 53 y.o. male presenting with lower extremity pain. The history is provided by the patient.  Foot Pain This is a new problem.   patient of pain in his right foot and ankle. Began this evening. States he tried new Shoes and thinks it may have done this. Worse with walking. No trauma. Does not feel like his previous gout. It is in his right mid foot and ankle. No fevers. No difficulty breathing.   Past Medical History  Diagnosis Date  . Hypertension   . Hyperlipidemia   . CHF (congestive heart failure)     EF 50-55%    . Erectile dysfunction   . Obesity   . Gout   . CKD (chronic kidney disease)    Past Surgical History  Procedure Laterality Date  . Colonoscopy    . Salivary gland surgery  09/12/2012  . Cardiac catheterization  2012  . Submandibular gland excision Right 09/12/2012    Procedure: Removal Right Submandibular Larina Bras;  Surgeon: Serena Colonel, MD;  Location: [facility];  Service: ENT;  Laterality: Right;  . Tee without cardioversion N/A 04/03/2014    Procedure: TRANSESOPHAGEAL ECHOCARDIOGRAM (TEE);  Surgeon: Laurey Morale, MD;  Location: [facility] ENDOSCOPY;  Service: Cardiovascular;  Laterality: N/A;  . Cardioversion N/A 04/03/2014    Procedure: CARDIOVERSION;  Surgeon: Laurey Morale, MD;  Location: [facility] ENDOSCOPY;  Service: Cardiovascular;  Laterality: N/A;   Family History  Problem Relation Age of Onset  . Hypertension Mother   . Hypertension Father   . Cancer Father     brother died of brain cancer; sister died of bone cancer  . Diabetes Sister   . Diabetes Brother   . Colon cancer Maternal Aunt   . Cardiomyopathy Neg Hx    History  Substance Use Topics  . Smoking status: Never Smoker   .  Smokeless tobacco: Never Used  . Alcohol Use: No    Review of Systems  Constitutional: Negative for fever.  Musculoskeletal: Negative for arthralgias, back pain, joint swelling and myalgias.  Skin: Negative for rash and wound.  Neurological: Negative for weakness and numbness.      Allergies  Beta adrenergic blockers  Home Medications   Prior to Admission medications   Medication Sig Start Date End Date Taking? Authorizing Provider  allopurinol (ZYLOPRIM) 100 MG tablet Take 1 tablet (100 mg total) by mouth daily. 03/20/14 03/20/15 Yes Luisa Dago, MD  apixaban (ELIQUIS) 5 MG TABS tablet Take 5 mg by mouth 2 (two) times daily.   Yes Historical Provider, MD  carvedilol (COREG) 25 MG tablet Take 1 tablet (25 mg total) by mouth 2 (two) times daily with a meal. 01/05/14  Yes Dolores Patty, MD  hydrALAZINE (APRESOLINE) 50 MG tablet Take 1 tablet (50 mg total) by mouth 3 (three) times daily. 04/14/14  Yes Aundria Rud, NP  isosorbide mononitrate (IMDUR) 60 MG 24 hr tablet Take 1 tablet (60 mg total) by mouth daily. 01/05/14  Yes Dolores Patty, MD  potassium chloride SA (K-DUR,KLOR-CON) 20 MEQ tablet Take 1 tablet (20 mEq total) by mouth 2 (two) times daily. 01/05/14  Yes Dolores Patty, MD  simvastatin (ZOCOR) 40 MG tablet Take 1 tablet (40 mg total)  by mouth at bedtime. 01/05/14  Yes Dolores Patty, MD  spironolactone (ALDACTONE) 25 MG tablet Take 1 tablet (25 mg total) by mouth daily. 01/05/14  Yes Dolores Patty, MD  torsemide (DEMADEX) 20 MG tablet Take 3 tabs in AM and 2 tabs in PM 04/14/14  Yes Aundria Rud, NP  traMADol (ULTRAM) 50 MG tablet Take 1 tablet (50 mg total) by mouth every 6 (six) hours as needed. 04/17/14   Juliet Rude. Agam Davenport, MD   BP 127/61  Pulse 94  Temp(Src) 99.9 F (37.7 C) (Oral)  Resp 16  Ht 5' 9.5" (1.765 m)  Wt 326 lb (147.873 kg)  BMI 47.47 kg/m2  SpO2 97% Physical Exam  Constitutional:  Patient is obese  Musculoskeletal: He  exhibits tenderness.  Tenderness right mid foot on the plantar surface. No bony tenderness. Slight pain with movement of ankles but not year-old. No erythema. Dorsalis pedis pulse intact. Good capillary refill  Neurological: He is alert.  Skin: Skin is warm.  Psychiatric: He has a normal mood and affect.    ED Course  Procedures (including critical care time) Labs Review Labs Reviewed - No data to display  Imaging Review No results found.   EKG Interpretation None      MDM   Final diagnoses:  Plantar fasciitis of right foot     patient with foot pain. New shoes today. May be plantar fasciitis versus just direct pain from shoe. Does not appear to need x-rays at this time. Will discharge home     Juliet Rude. Rubin Payor, MD 04/17/14 [phone]

## 2014-04-17 NOTE — Discharge Instructions (Signed)
Plantar Fasciitis  Plantar fasciitis is a common condition that causes foot pain. It is soreness (inflammation) of the band of tough fibrous tissue on the bottom of the foot that runs from the heel bone (calcaneus) to the ball of the foot. The cause of this soreness may be from excessive standing, poor fitting shoes, running on hard surfaces, being overweight, having an abnormal walk, or overuse (this is common in runners) of the painful foot or feet. It is also common in aerobic exercise dancers and ballet dancers.  SYMPTOMS   Most people with plantar fasciitis complain of:   Severe pain in the morning on the bottom of their foot especially when taking the first steps out of bed. This pain recedes after a few minutes of walking.   Severe pain is experienced also during walking following a long period of inactivity.   Pain is worse when walking barefoot or up stairs  DIAGNOSIS    Your caregiver will diagnose this condition by examining and feeling your foot.   Special tests such as X-rays of your foot, are usually not needed.  PREVENTION    Consult a sports medicine professional before beginning a new exercise program.   Walking programs offer a good workout. With walking there is a lower chance of overuse injuries common to runners. There is less impact and less jarring of the joints.   Begin all new exercise programs slowly. If problems or pain develop, decrease the amount of time or distance until you are at a comfortable level.   Wear good shoes and replace them regularly.   Stretch your foot and the heel cords at the back of the ankle (Achilles tendon) both before and after exercise.   Run or exercise on even surfaces that are not hard. For example, asphalt is better than pavement.   Do not run barefoot on hard surfaces.   If using a treadmill, vary the incline.   Do not continue to workout if you have foot or joint problems. Seek professional help if they do not improve.  HOME CARE INSTRUCTIONS     Avoid activities that cause you pain until you recover.   Use ice or cold packs on the problem or painful areas after working out.   Only take over-the-counter or prescription medicines for pain, discomfort, or fever as directed by your caregiver.   Soft shoe inserts or athletic shoes with air or gel sole cushions may be helpful.   If problems continue or become more severe, consult a sports medicine caregiver or your own health care provider. Cortisone is a potent anti-inflammatory medication that may be injected into the painful area. You can discuss this treatment with your caregiver.  MAKE SURE YOU:    Understand these instructions.   Will watch your condition.   Will get help right away if you are not doing well or get worse.  Document Released: 04/04/2001 Document Revised: 10/02/2011 Document Reviewed: 06/03/2008  [employer] Patient Information 2015 [employer]. This information is not intended to replace advice given to you by your health care provider. Make sure you discuss any questions you have with your health care provider.

## 2014-04-17 NOTE — ED Notes (Signed)
Pain in right foot at instep, thought initially was gout but thinks it may be his arthritis.  Pt denies recent injury

## 2014-04-20 ENCOUNTER — Inpatient Hospital Stay (HOSPITAL_COMMUNITY)
Admission: EM | Admit: 2014-04-20 | Discharge: 2014-04-27 | DRG: 287 | Disposition: A | Discharge status: Home / Self Care | Payer: Medicaid Other | Attending: Oncology | Admitting: Oncology

## 2014-04-20 ENCOUNTER — Encounter (HOSPITAL_COMMUNITY): Payer: Self-pay | Admitting: Emergency Medicine

## 2014-04-20 ENCOUNTER — Emergency Department (HOSPITAL_COMMUNITY): Payer: Medicaid Other

## 2014-04-20 DIAGNOSIS — M109 Gout, unspecified: Secondary | ICD-10-CM | POA: Diagnosis present

## 2014-04-20 DIAGNOSIS — L0591 Pilonidal cyst without abscess: Secondary | ICD-10-CM

## 2014-04-20 DIAGNOSIS — I129 Hypertensive chronic kidney disease with stage 1 through stage 4 chronic kidney disease, or unspecified chronic kidney disease: Secondary | ICD-10-CM | POA: Diagnosis present

## 2014-04-20 DIAGNOSIS — N183 Chronic kidney disease, stage 3 unspecified: Secondary | ICD-10-CM | POA: Diagnosis present

## 2014-04-20 DIAGNOSIS — N179 Acute kidney failure, unspecified: Secondary | ICD-10-CM | POA: Diagnosis present

## 2014-04-20 DIAGNOSIS — N182 Chronic kidney disease, stage 2 (mild): Secondary | ICD-10-CM

## 2014-04-20 DIAGNOSIS — I119 Hypertensive heart disease without heart failure: Secondary | ICD-10-CM | POA: Diagnosis present

## 2014-04-20 DIAGNOSIS — I472 Ventricular tachycardia: Secondary | ICD-10-CM | POA: Diagnosis present

## 2014-04-20 DIAGNOSIS — N184 Chronic kidney disease, stage 4 (severe): Secondary | ICD-10-CM | POA: Diagnosis present

## 2014-04-20 DIAGNOSIS — I4892 Unspecified atrial flutter: Secondary | ICD-10-CM | POA: Diagnosis present

## 2014-04-20 DIAGNOSIS — I429 Cardiomyopathy, unspecified: Secondary | ICD-10-CM | POA: Diagnosis present

## 2014-04-20 DIAGNOSIS — I5043 Acute on chronic combined systolic (congestive) and diastolic (congestive) heart failure: Secondary | ICD-10-CM

## 2014-04-20 DIAGNOSIS — Z6841 Body Mass Index (BMI) 40.0 and over, adult: Secondary | ICD-10-CM

## 2014-04-20 DIAGNOSIS — G4733 Obstructive sleep apnea (adult) (pediatric): Secondary | ICD-10-CM | POA: Diagnosis present

## 2014-04-20 DIAGNOSIS — R079 Chest pain, unspecified: Secondary | ICD-10-CM

## 2014-04-20 DIAGNOSIS — Z79899 Other long term (current) drug therapy: Secondary | ICD-10-CM

## 2014-04-20 DIAGNOSIS — Z8249 Family history of ischemic heart disease and other diseases of the circulatory system: Secondary | ICD-10-CM

## 2014-04-20 DIAGNOSIS — Z7901 Long term (current) use of anticoagulants: Secondary | ICD-10-CM

## 2014-04-20 DIAGNOSIS — E785 Hyperlipidemia, unspecified: Secondary | ICD-10-CM | POA: Diagnosis present

## 2014-04-20 DIAGNOSIS — I272 Other secondary pulmonary hypertension: Secondary | ICD-10-CM | POA: Diagnosis present

## 2014-04-20 DIAGNOSIS — I5022 Chronic systolic (congestive) heart failure: Secondary | ICD-10-CM

## 2014-04-20 DIAGNOSIS — N189 Chronic kidney disease, unspecified: Secondary | ICD-10-CM

## 2014-04-20 DIAGNOSIS — R0602 Shortness of breath: Secondary | ICD-10-CM

## 2014-04-20 DIAGNOSIS — D638 Anemia in other chronic diseases classified elsewhere: Secondary | ICD-10-CM | POA: Diagnosis present

## 2014-04-20 DIAGNOSIS — I428 Other cardiomyopathies: Secondary | ICD-10-CM

## 2014-04-20 DIAGNOSIS — I493 Ventricular premature depolarization: Secondary | ICD-10-CM | POA: Diagnosis present

## 2014-04-20 DIAGNOSIS — I10 Essential (primary) hypertension: Secondary | ICD-10-CM

## 2014-04-20 LAB — COMPREHENSIVE METABOLIC PANEL
ALT: 20 U/L (ref 0–53)
AST: 22 U/L (ref 0–37)
Albumin: 3.8 g/dL (ref 3.5–5.2)
Alkaline Phosphatase: 77 U/L (ref 39–117)
Anion gap: 15 (ref 5–15)
BUN: 33 mg/dL — ABNORMAL HIGH (ref 6–23)
CALCIUM: 9 mg/dL (ref 8.4–10.5)
CO2: 26 meq/L (ref 19–32)
CREATININE: 2.06 mg/dL — AB (ref 0.50–1.35)
Chloride: 102 mEq/L (ref 96–112)
GFR calc Af Amer: 41 mL/min — ABNORMAL LOW (ref >90)
GFR, EST NON AFRICAN AMERICAN: 35 mL/min — AB (ref >90)
Glucose, Bld: 94 mg/dL (ref 70–99)
Potassium: 4.5 mEq/L (ref 3.7–5.3)
Sodium: 143 mEq/L (ref 137–147)
TOTAL PROTEIN: 8.1 g/dL (ref 6.0–8.3)
Total Bilirubin: 0.3 mg/dL (ref 0.3–1.2)

## 2014-04-20 LAB — CBC
HCT: 36.3 % — ABNORMAL LOW (ref 39.0–52.0)
Hemoglobin: 11.3 g/dL — ABNORMAL LOW (ref 13.0–17.0)
MCH: 26.7 pg (ref 26.0–34.0)
MCHC: 31.1 g/dL (ref 30.0–36.0)
MCV: 85.8 fL (ref 78.0–100.0)
PLATELETS: 229 10*3/uL (ref 150–400)
RBC: 4.23 MIL/uL (ref 4.22–5.81)
RDW: 13.3 % (ref 11.5–15.5)
WBC: 10.6 10*3/uL — ABNORMAL HIGH (ref 4.0–10.5)

## 2014-04-20 LAB — I-STAT TROPONIN, ED: Troponin i, poc: 0.01 ng/mL (ref 0.00–0.08)

## 2014-04-20 MED ORDER — ASPIRIN 81 MG PO CHEW
324.0000 mg | CHEWABLE_TABLET | Freq: Once | ORAL | Status: AC
  Administered 2014-04-20: 324 mg via ORAL
  Filled 2014-04-20: qty 4

## 2014-04-20 NOTE — H&P (Signed)
Date: 04/20/2014               Patient Name:  Eric Murillo MRN: [id number]  DOB: 1960/08/04 Age / Sex: 53 y.o., male   PCP: Luisa Dago, MD              Medical Service: Internal Medicine Teaching Service              Attending Physician: Dr. Inez Catalina, MD    First Contact: Dr. Isabella Bowens Pager: [phone]  Second Contact: Dr. Shirlee Latch Pager: [phone]            After Hours (After 5p/  First Contact Pager: [phone]  weekends / holidays): Second Contact Pager: [phone]   Chief Complaint:  Chest pain  History of Present Illness: Eric Murillo is a 53 year old man with history of obesity, HLD, HTN, gout, CKD, nonischemic cardiomyopathy with CHF (EF 45%), and paroxysmal atrial flutter on   Eric Murillo was seen in clinic on 03/20/14 with complaint of shortness of breath on exertion, and he was found to be in atrial flutter.  He follows with Dr. Shirlee Latch of Heart Failure clinic who started him on Eliquis, and he underwent successful DCCV on 04/03/14.  TEE at that time showed his EF was down to 25-30% from 45% on 03/10/13 with mild to moderate MR.  He is scheduled to see EP on 05/11/14 to discuss possible ablation.  He has been followed closely in heart failure clinic for volume overload and elevated creatinine with diuresis.  His torsemide was recently increased to improve his diuresis and his enalapril has been held due to concern of AKI.  Today, he reports developing exertional chest tightness and shortness of breath.  He says the symptoms are similar to what he felt when he was in atrial flutter before, and he does not have symptoms at rest.  However, he is only able to ambulate a few steps before developing severe chest tightness.  He says that his lower extremity edema may be a little bit worse than normal, and he sleeps on his side with two pillows at night due to orthopnea.  He denies leg pain or unilateral swelling.  He does report some indigestion with frequent burping, but denies burning  or metallic taste in his mouth.  In the ER, he was noted to be in sinus rhythm. Initial troponin and EKG were negative for ischemia, and chest x-ray showed no acute abnormalities.  He was given aspirin 324 mg.  Review of Systems: Review of Systems  Constitutional: Negative for fever, chills, weight loss, malaise/fatigue and diaphoresis.  HENT: Negative for congestion and sore throat.   Eyes: Negative for blurred vision.  Respiratory: Positive for shortness of breath. Negative for cough, hemoptysis, sputum production and wheezing.   Cardiovascular: Positive for chest pain, orthopnea and leg swelling. Negative for palpitations and claudication.  Gastrointestinal: Negative for heartburn, nausea, vomiting, abdominal pain, diarrhea, constipation and blood in stool.  Genitourinary: Negative for dysuria and hematuria.  Musculoskeletal: Positive for joint pain (R foot-3 days ago.). Negative for myalgias.  Skin: Negative for itching and rash.  Neurological: Negative for dizziness, sensory change, focal weakness, weakness and headaches.    Meds:  (Not in a hospital admission) No current facility-administered medications for this encounter.   Current Outpatient Prescriptions  Medication Sig Dispense Refill  . allopurinol (ZYLOPRIM) 100 MG tablet Take 1 tablet (100 mg total) by mouth daily.  30 tablet  2  . apixaban (ELIQUIS) 5 MG  TABS tablet Take 5 mg by mouth 2 (two) times daily.      . carvedilol (COREG) 25 MG tablet Take 1 tablet (25 mg total) by mouth 2 (two) times daily with a meal.  60 tablet  6  . hydrALAZINE (APRESOLINE) 50 MG tablet Take 1 tablet (50 mg total) by mouth 3 (three) times daily.  90 tablet  6  . isosorbide mononitrate (IMDUR) 60 MG 24 hr tablet Take 1 tablet (60 mg total) by mouth daily.  30 tablet  6  . potassium chloride SA (K-DUR,KLOR-CON) 20 MEQ tablet Take 1 tablet (20 mEq total) by mouth 2 (two) times daily.  60 tablet  3  . simvastatin (ZOCOR) 40 MG tablet Take 1  tablet (40 mg total) by mouth at bedtime.  30 tablet  6  . spironolactone (ALDACTONE) 25 MG tablet Take 1 tablet (25 mg total) by mouth daily.  30 tablet  6  . torsemide (DEMADEX) 20 MG tablet Take 40-60 mg by mouth 2 (two) times daily. Takes 3 tablets every morning and 2 tablets every evening.      . traMADol (ULTRAM) 50 MG tablet Take by mouth every 6 (six) hours as needed for moderate pain.        Allergies: Allergies as of 04/20/2014 - Review Complete 04/20/2014  Allergen Reaction Noted  . Beta adrenergic blockers Other (See Comments) 10/19/2008   Past Medical History  Diagnosis Date  . Hypertension   . Hyperlipidemia   . CHF (congestive heart failure)     EF 50-55%    . Erectile dysfunction   . Obesity   . Gout   . CKD (chronic kidney disease)    Past Surgical History  Procedure Laterality Date  . Colonoscopy    . Salivary gland surgery  09/12/2012  . Cardiac catheterization  2012  . Submandibular gland excision Right 09/12/2012    Procedure: Removal Right Submandibular Larina Bras;  Surgeon: Serena Colonel, MD;  Location: [facility] OR;  Service: ENT;  Laterality: Right;  . Tee without cardioversion N/A 04/03/2014    Procedure: TRANSESOPHAGEAL ECHOCARDIOGRAM (TEE);  Surgeon: Laurey Morale, MD;  Location: [facility] ENDOSCOPY;  Service: Cardiovascular;  Laterality: N/A;  . Cardioversion N/A 04/03/2014    Procedure: CARDIOVERSION;  Surgeon: Laurey Morale, MD;  Location: [facility] ENDOSCOPY;  Service: Cardiovascular;  Laterality: N/A;   Family History  Problem Relation Age of Onset  . Hypertension Mother   . Hypertension Father   . Cancer Father     brother died of brain cancer; sister died of bone cancer  . Diabetes Sister   . Diabetes Brother   . Colon cancer Maternal Aunt   . Cardiomyopathy Neg Hx    History   Social History  . Marital Status: Single    Spouse Name: N/A    Number of Children: N/A  . Years of Education: N/A   Occupational History  . Not on file.   Social History Main  Topics  . Smoking status: Never Smoker   . Smokeless tobacco: Never Used  . Alcohol Use: No  . Drug Use: No  . Sexual Activity: No   Other Topics Concern  . Not on file   Social History Narrative   Financial assistance approved for 100% discount at [facility] and has [facility] card   [employer]  March 16, 2010 9:42 AM      Lives with mother.  Has a girlfriend    Physical Exam: Filed Vitals:   04/20/14 2245  BP: 115/52  Pulse: 89  Temp: 98.6  Resp: 19  Weight: 329 lb (149 kg)    Physical Exam  Constitutional: He is oriented to person, place, and time and well-developed, well-nourished, and in no distress. No distress.  HENT:  Head: Normocephalic and atraumatic.  Eyes: Conjunctivae and EOM are normal. Pupils are equal, round, and reactive to light.  Neck: Normal range of motion. Neck supple.  Cardiovascular: Normal rate, regular rhythm and normal heart sounds.   Pulmonary/Chest: Effort normal and breath sounds normal. No respiratory distress. He has no wheezes. He has no rales.  Abdominal: Soft. Bowel sounds are normal. He exhibits no distension. There is no tenderness.  Musculoskeletal: He exhibits edema (1+ pitting to knees bilaterally.).  Neurological: He is alert and oriented to person, place, and time. No cranial nerve deficit. He exhibits normal muscle tone. Coordination normal.  Skin: Skin is warm and dry. He is not diaphoretic.  Acanthosis nigricans and skin tags around neck.    Lab results: Basic Metabolic Panel:  Recent Labs  16/10/96 2138  NA 143  K 4.5  CL 102  CO2 26  GLUCOSE 94  BUN 33*  CREATININE 2.06*  CALCIUM 9.0   Liver Function Tests:  Recent Labs  04/20/14 2138  AST 22  ALT 20  ALKPHOS 77  BILITOT 0.3  PROT 8.1  ALBUMIN 3.8   CBC:  Recent Labs  04/20/14 2138  WBC 10.6*  HGB 11.3*  HCT 36.3*  MCV 85.8  PLT 229    Imaging results:  Dg Chest 2 View  04/20/2014   CLINICAL DATA:  Chest pain, shortness of breath  EXAM: CHEST  2  VIEW  COMPARISON:  Prior radiograph from 03/20/2014  FINDINGS: Cardiac silhouette is moderately enlarged. Mediastinal silhouette within normal limits.  Lungs are mildly hypoinflated with elevation of the right hemidiaphragm, similar to prior. Mild central perihilar vascular congestion present. No pulmonary edema or pleural effusion. There is no focal infiltrate. No pneumothorax.  No acute osseus abnormality.  IMPRESSION: Stable cardiomegaly with no acute cardiopulmonary abnormality identified.   Electronically Signed   By: Rise Mu M.D.   On: 04/20/2014 22:08    Other results: EKG: sinus rhythm with 1st degree AV block, PVC, left axis deviation.  Assessment & Plan by Problem: Active Problems:   Chest pain   #Exertional chest pain and shortness of breath Similar to previous symptoms when in atrial fibrillation, but in sinus rhythm currently.  It is possible that he was in atrial fibrillation earlier today when his symptoms started.  However, ACS should be ruled out given his CHF and risk factors for CAD.  Cath on 09/2010 showed no angiographic CAD.  Received aspirin in ER.  Does have some indigestion and not on PPI.  Question whether this could be secondary to asthma or anxiety as well. -Admit to telemetry. -Cycle troponins and EKG. -Consult cardiology to discuss possible stress testing. -GI cocktail PRN for indigestion, Zofran PRN. -Consider ambulating patient on telemetry. -HIV screen, last tested in 2012.  #Nonischemic cardiomyopathy Likely due to long-standing hypertension given no ischemic etiology in past.  EF reduced on recent TEE, but likely related to elevated rate from atrial fibrillation. He has been following closely with the heart failure clinic due to increased volume overload over the past couple of months. This could also be related to decreased heart function overall.  Weight up today 3 lbs from last measurement. -Continue home Coreg 25 mg BID, hydralazine 50 mg TID,  Imdur 60 mg daily -Continue home Aldactone 25 mg daily, torsemide 60 mg AM and 40 mg PM, and Kdur 20 mEq BID starting tomorrow. -Daily weights, I/O.  #Paroxysmal atrial flutter In sinus currently.  Scheduled to see EP in October to discuss ablation. -Continue Eliquis 5 mg daily.  #CKD, stage 2 Baseline creatinine 1.4.  Elevated in the setting of increased diuresis with torsemide increased by heart failure clinic. -Continue to hold ACE inhibitor. -Recheck BMP tomorrow morning.  #Gout Seen in ER three days ago for right foot and ankle pain.  Thought to be due to new pair of shoes vs. Gout.  Given tramadol with resolution.  Avoiding NSAIDs due to kidney dysfunction. -Continue home allopurinol 100 mg daily. -Tramadol 50 mg q6h PRN for pain.  #Hyperlipidemia -Continue home Zocor 40 mg daily.   #Obstructive sleep apnea Reports not using CPAP due to mask being uncomfortable.  Willing to try smaller mask in hospital. -CPAP as tolerated.  #Anemia Hgb 11.3 down from 13.3 a year ago.  Started on Eliquis, denies any bleeding. Normal colonoscopy in 2012. -Anemia panel.  Dispo: Disposition is deferred at this time, awaiting improvement of current medical problems. Anticipated discharge in approximately 1-2 day(s).   The patient does have a current PCP Luisa Dago, MD), therefore will be require OPC follow-up after discharge.   The patient does not have transportation limitations that hinder transportation to clinic appointments.   Signed:  Luisa Dago, MD, PhD PGY-1 Internal Medicine Teaching Service Pager: [phone] 04/20/2014, 11:51 PM

## 2014-04-20 NOTE — ED Notes (Signed)
Pt. Reports sudden onset chest pain today when ambulating. States when tightness only when ambulating, relief when sitting/resting. Reports SOB and nausea. Denies vomiting or diaphoresis. Denies pain at this time. States last week he had to be cardioverted out of afib and had an echo done, hx of CHF. In NSR at this time.

## 2014-04-20 NOTE — ED Notes (Signed)
Pt presents with chest tightness and shortness of breath for the past 3 hours, admits that pain is worse with exertion.  Pt was cardioverted in ED last week- reports pain feels similar to that episode.  Pt HR currently 86bpm.  Denies N/V.  Respirations e/u, no distress noted at this time.

## 2014-04-20 NOTE — ED Provider Notes (Signed)
CSN: [id number]     Arrival date & time 04/20/14  2118 History   First MD Initiated Contact with Patient 04/20/14 2212     Chief Complaint  Patient presents with  . Chest Pain  . Shortness of Breath     (Consider location/radiation/quality/duration/timing/severity/associated sxs/prior Treatment) HPI 53 year old male presents with exertional chest tightness and shortness of breath starting today. 2 weeks ago on 9/11 he had an ablation done for atrial flutter. He states the symptoms he felt were similar to the symptoms he had before the ablation. He has not had any chest pain or chest tightness at rest. Does not have any pleuritic chest pain currently. No fevers or chills. No cough. He has chronic lower extremity swelling that maybe a little bit worse than normal. Patient is able to ambulate only a few steps before getting severe chest tightness and trouble breathing.  Past Medical History  Diagnosis Date  . Hypertension   . Hyperlipidemia   . CHF (congestive heart failure)     EF 50-55%    . Erectile dysfunction   . Obesity   . Gout   . CKD (chronic kidney disease)    Past Surgical History  Procedure Laterality Date  . Colonoscopy    . Salivary gland surgery  09/12/2012  . Cardiac catheterization  2012  . Submandibular gland excision Right 09/12/2012    Procedure: Removal Right Submandibular Larina Bras;  Surgeon: Serena Colonel, MD;  Location: [facility] OR;  Service: ENT;  Laterality: Right;  . Tee without cardioversion N/A 04/03/2014    Procedure: TRANSESOPHAGEAL ECHOCARDIOGRAM (TEE);  Surgeon: Laurey Morale, MD;  Location: [facility] ENDOSCOPY;  Service: Cardiovascular;  Laterality: N/A;  . Cardioversion N/A 04/03/2014    Procedure: CARDIOVERSION;  Surgeon: Laurey Morale, MD;  Location: [facility] ENDOSCOPY;  Service: Cardiovascular;  Laterality: N/A;   Family History  Problem Relation Age of Onset  . Hypertension Mother   . Hypertension Father   . Cancer Father     brother died of brain cancer; sister  died of bone cancer  . Diabetes Sister   . Diabetes Brother   . Colon cancer Maternal Aunt   . Cardiomyopathy Neg Hx    History  Substance Use Topics  . Smoking status: Never Smoker   . Smokeless tobacco: Never Used  . Alcohol Use: No    Review of Systems  Constitutional: Negative for fever.  Respiratory: Positive for shortness of breath.   Cardiovascular: Positive for chest pain and leg swelling.  Gastrointestinal: Negative for vomiting.  All other systems reviewed and are negative.     Allergies  Beta adrenergic blockers  Home Medications   Prior to Admission medications   Medication Sig Start Date End Date Taking? Authorizing Provider  allopurinol (ZYLOPRIM) 100 MG tablet Take 1 tablet (100 mg total) by mouth daily. 03/20/14 03/20/15 Yes Luisa Dago, MD  apixaban (ELIQUIS) 5 MG TABS tablet Take 5 mg by mouth 2 (two) times daily.   Yes Historical Provider, MD  carvedilol (COREG) 25 MG tablet Take 1 tablet (25 mg total) by mouth 2 (two) times daily with a meal. 01/05/14  Yes Dolores Patty, MD  hydrALAZINE (APRESOLINE) 50 MG tablet Take 1 tablet (50 mg total) by mouth 3 (three) times daily. 04/14/14  Yes Aundria Rud, NP  isosorbide mononitrate (IMDUR) 60 MG 24 hr tablet Take 1 tablet (60 mg total) by mouth daily. 01/05/14  Yes Dolores Patty, MD  potassium chloride SA (K-DUR,KLOR-CON) 20 MEQ tablet Take  1 tablet (20 mEq total) by mouth 2 (two) times daily. 01/05/14  Yes Dolores Patty, MD  simvastatin (ZOCOR) 40 MG tablet Take 1 tablet (40 mg total) by mouth at bedtime. 01/05/14  Yes Dolores Patty, MD  spironolactone (ALDACTONE) 25 MG tablet Take 1 tablet (25 mg total) by mouth daily. 01/05/14  Yes Dolores Patty, MD  torsemide (DEMADEX) 20 MG tablet Take 40-60 mg by mouth 2 (two) times daily. Takes 3 tablets every morning and 2 tablets every evening.   Yes Historical Provider, MD  traMADol (ULTRAM) 50 MG tablet Take by mouth every 6 (six) hours as needed  for moderate pain.   Yes Historical Provider, MD   BP 115/52  Pulse 89  Temp(Src) 99.1 F (37.3 C)  Resp 19  SpO2 95% Physical Exam  Nursing note and vitals reviewed. Constitutional: He is oriented to person, place, and time. He appears well-developed and well-nourished. No distress.  HENT:  Head: Normocephalic and atraumatic.  Right Ear: External ear normal.  Left Ear: External ear normal.  Nose: Nose normal.  Eyes: Right eye exhibits no discharge. Left eye exhibits no discharge.  Neck: Neck supple.  Cardiovascular: Normal rate, regular rhythm, normal heart sounds and intact distal pulses.   Pulmonary/Chest: Effort normal and breath sounds normal.  Abdominal: Soft. There is no tenderness.  Musculoskeletal: He exhibits no edema (mild bilateral lower extremity edema).  Neurological: He is alert and oriented to person, place, and time.  Skin: Skin is warm and dry.    ED Course  Procedures (including critical care time) Labs Review Labs Reviewed  CBC - Abnormal; Notable for the following:    WBC 10.6 (*)    Hemoglobin 11.3 (*)    HCT 36.3 (*)    All other components within normal limits  COMPREHENSIVE METABOLIC PANEL - Abnormal; Notable for the following:    BUN 33 (*)    Creatinine, Ser 2.06 (*)    GFR calc non Af Amer 35 (*)    GFR calc Af Amer 41 (*)    All other components within normal limits  PRO B NATRIURETIC PEPTIDE  I-STAT TROPOININ, ED    Imaging Review Dg Chest 2 View  04/20/2014   CLINICAL DATA:  Chest pain, shortness of breath  EXAM: CHEST  2 VIEW  COMPARISON:  Prior radiograph from 03/20/2014  FINDINGS: Cardiac silhouette is moderately enlarged. Mediastinal silhouette within normal limits.  Lungs are mildly hypoinflated with elevation of the right hemidiaphragm, similar to prior. Mild central perihilar vascular congestion present. No pulmonary edema or pleural effusion. There is no focal infiltrate. No pneumothorax.  No acute osseus abnormality.  IMPRESSION:  Stable cardiomegaly with no acute cardiopulmonary abnormality identified.   Electronically Signed   By: Rise Mu M.D.   On: 04/20/2014 22:08     EKG Interpretation   Date/Time:  Monday April 20 2014 21:21:22 EDT Ventricular Rate:  90 PR Interval:  224 QRS Duration: 104 QT Interval:  376 QTC Calculation: 459 R Axis:   -76 Text Interpretation:  Sinus rhythm with 1st degree A-V block with  occasional Premature ventricular complexes Low voltage QRS Left anterior  fascicular block Possible Anterolateral infarct , age undetermined  Abnormal ECG No significant change since last tracing Confirmed by  Arihana Ambrocio  MD, Neria Procter ([id number]) on 04/20/2014 10:17:03 PM      MDM   Final diagnoses:  Chest pain, unspecified chest pain type    Patient symptoms are concerning for a cardiac cause of  his exertional angina. No signs of myocardial infarction this time. Discussed with cardiology, Dr. Tresa Endo, who recommends medicine admission if needed. I feel that this is needed with no other obvious explanation a cardiac cause these be ruled out. Discussed with the internal medicine teaching service who will admit for overnight observation.    Audree Camel, MD 04/20/14 [phone]

## 2014-04-20 NOTE — Progress Notes (Addendum)
Writing nurse attempted report, ED nurse stated she would call writing nurse back.

## 2014-04-20 NOTE — ED Notes (Signed)
Patient transported to X-ray without distress. Xray told to transport to 36

## 2014-04-20 NOTE — ED Notes (Signed)
Introduced myself to the patient.  He is resting comfortably. 

## 2014-04-21 ENCOUNTER — Encounter (HOSPITAL_COMMUNITY): Payer: Self-pay | Admitting: Emergency Medicine

## 2014-04-21 DIAGNOSIS — R079 Chest pain, unspecified: Secondary | ICD-10-CM

## 2014-04-21 DIAGNOSIS — Z7901 Long term (current) use of anticoagulants: Secondary | ICD-10-CM | POA: Diagnosis not present

## 2014-04-21 DIAGNOSIS — Z79899 Other long term (current) drug therapy: Secondary | ICD-10-CM | POA: Diagnosis not present

## 2014-04-21 DIAGNOSIS — I493 Ventricular premature depolarization: Secondary | ICD-10-CM | POA: Diagnosis present

## 2014-04-21 DIAGNOSIS — I2789 Other specified pulmonary heart diseases: Secondary | ICD-10-CM | POA: Diagnosis not present

## 2014-04-21 DIAGNOSIS — I10 Essential (primary) hypertension: Secondary | ICD-10-CM

## 2014-04-21 DIAGNOSIS — I428 Other cardiomyopathies: Secondary | ICD-10-CM | POA: Diagnosis not present

## 2014-04-21 DIAGNOSIS — I4892 Unspecified atrial flutter: Secondary | ICD-10-CM

## 2014-04-21 DIAGNOSIS — I272 Other secondary pulmonary hypertension: Secondary | ICD-10-CM | POA: Diagnosis present

## 2014-04-21 DIAGNOSIS — D638 Anemia in other chronic diseases classified elsewhere: Secondary | ICD-10-CM | POA: Diagnosis present

## 2014-04-21 DIAGNOSIS — N183 Chronic kidney disease, stage 3 unspecified: Secondary | ICD-10-CM | POA: Diagnosis present

## 2014-04-21 DIAGNOSIS — Z6841 Body Mass Index (BMI) 40.0 and over, adult: Secondary | ICD-10-CM | POA: Diagnosis not present

## 2014-04-21 DIAGNOSIS — Z8249 Family history of ischemic heart disease and other diseases of the circulatory system: Secondary | ICD-10-CM | POA: Diagnosis not present

## 2014-04-21 DIAGNOSIS — I472 Ventricular tachycardia: Secondary | ICD-10-CM | POA: Diagnosis present

## 2014-04-21 DIAGNOSIS — M109 Gout, unspecified: Secondary | ICD-10-CM

## 2014-04-21 DIAGNOSIS — I5043 Acute on chronic combined systolic (congestive) and diastolic (congestive) heart failure: Secondary | ICD-10-CM

## 2014-04-21 DIAGNOSIS — D649 Anemia, unspecified: Secondary | ICD-10-CM

## 2014-04-21 DIAGNOSIS — G4733 Obstructive sleep apnea (adult) (pediatric): Secondary | ICD-10-CM

## 2014-04-21 DIAGNOSIS — I5023 Acute on chronic systolic (congestive) heart failure: Secondary | ICD-10-CM

## 2014-04-21 DIAGNOSIS — I129 Hypertensive chronic kidney disease with stage 1 through stage 4 chronic kidney disease, or unspecified chronic kidney disease: Secondary | ICD-10-CM | POA: Diagnosis present

## 2014-04-21 DIAGNOSIS — I4729 Other ventricular tachycardia: Secondary | ICD-10-CM | POA: Diagnosis not present

## 2014-04-21 DIAGNOSIS — I429 Cardiomyopathy, unspecified: Secondary | ICD-10-CM | POA: Diagnosis present

## 2014-04-21 DIAGNOSIS — N179 Acute kidney failure, unspecified: Secondary | ICD-10-CM | POA: Diagnosis present

## 2014-04-21 DIAGNOSIS — I5022 Chronic systolic (congestive) heart failure: Secondary | ICD-10-CM

## 2014-04-21 DIAGNOSIS — E785 Hyperlipidemia, unspecified: Secondary | ICD-10-CM | POA: Diagnosis present

## 2014-04-21 LAB — BASIC METABOLIC PANEL
Anion gap: 12 (ref 5–15)
BUN: 28 mg/dL — AB (ref 6–23)
CO2: 28 meq/L (ref 19–32)
Calcium: 9.1 mg/dL (ref 8.4–10.5)
Chloride: 104 mEq/L (ref 96–112)
Creatinine, Ser: 1.7 mg/dL — ABNORMAL HIGH (ref 0.50–1.35)
GFR calc Af Amer: 51 mL/min — ABNORMAL LOW (ref >90)
GFR calc non Af Amer: 44 mL/min — ABNORMAL LOW (ref >90)
GLUCOSE: 113 mg/dL — AB (ref 70–99)
POTASSIUM: 4.3 meq/L (ref 3.7–5.3)
Sodium: 144 mEq/L (ref 137–147)

## 2014-04-21 LAB — CBC WITH DIFFERENTIAL/PLATELET
Basophils Absolute: 0 10*3/uL (ref 0.0–0.1)
Basophils Relative: 0 % (ref 0–1)
EOS ABS: 0.3 10*3/uL (ref 0.0–0.7)
EOS PCT: 3 % (ref 0–5)
HEMATOCRIT: 35.1 % — AB (ref 39.0–52.0)
Hemoglobin: 11.1 g/dL — ABNORMAL LOW (ref 13.0–17.0)
LYMPHS ABS: 2.3 10*3/uL (ref 0.7–4.0)
LYMPHS PCT: 19 % (ref 12–46)
MCH: 27.2 pg (ref 26.0–34.0)
MCHC: 31.6 g/dL (ref 30.0–36.0)
MCV: 86 fL (ref 78.0–100.0)
MONOS PCT: 12 % (ref 3–12)
Monocytes Absolute: 1.4 10*3/uL — ABNORMAL HIGH (ref 0.1–1.0)
Neutro Abs: 8.1 10*3/uL — ABNORMAL HIGH (ref 1.7–7.7)
Neutrophils Relative %: 66 % (ref 43–77)
Platelets: 239 10*3/uL (ref 150–400)
RBC: 4.08 MIL/uL — AB (ref 4.22–5.81)
RDW: 13.4 % (ref 11.5–15.5)
WBC: 12.1 10*3/uL — AB (ref 4.0–10.5)

## 2014-04-21 LAB — PRO B NATRIURETIC PEPTIDE: Pro B Natriuretic peptide (BNP): 1869 pg/mL — ABNORMAL HIGH (ref 0–125)

## 2014-04-21 LAB — TROPONIN I
Troponin I: <0.3 ng/mL (ref <0.30)
Troponin I: <0.3 ng/mL (ref <0.30)
Troponin I: <0.3 ng/mL (ref <0.30)

## 2014-04-21 LAB — FOLATE: Folate: 12.9 ng/mL

## 2014-04-21 LAB — IRON AND TIBC
IRON: 17 ug/dL — AB (ref 42–135)
Saturation Ratios: 6 % — ABNORMAL LOW (ref 20–55)
TIBC: 274 ug/dL (ref 215–435)
UIBC: 257 ug/dL (ref 125–400)

## 2014-04-21 LAB — RAPID URINE DRUG SCREEN, HOSP PERFORMED
AMPHETAMINES: NOT DETECTED
BARBITURATES: NOT DETECTED
Benzodiazepines: NOT DETECTED
Cocaine: NOT DETECTED
Opiates: NOT DETECTED
Tetrahydrocannabinol: NOT DETECTED

## 2014-04-21 LAB — VITAMIN B12: VITAMIN B 12: 343 pg/mL (ref 211–911)

## 2014-04-21 LAB — RETICULOCYTES
RBC.: 3.95 MIL/uL — ABNORMAL LOW (ref 4.22–5.81)
Retic Count, Absolute: 79 10*3/uL (ref 19.0–186.0)
Retic Ct Pct: 2 % (ref 0.4–3.1)

## 2014-04-21 LAB — HIV ANTIBODY (ROUTINE TESTING W REFLEX): HIV 1&2 Ab, 4th Generation: NONREACTIVE

## 2014-04-21 LAB — FERRITIN: Ferritin: 540 ng/mL — ABNORMAL HIGH (ref 22–322)

## 2014-04-21 MED ORDER — ASPIRIN EC 325 MG PO TBEC
325.0000 mg | DELAYED_RELEASE_TABLET | Freq: Every day | ORAL | Status: DC
  Administered 2014-04-21 – 2014-04-25 (×5): 325 mg via ORAL
  Filled 2014-04-21 (×5): qty 1

## 2014-04-21 MED ORDER — ACETAMINOPHEN 325 MG PO TABS: 325.0000 mg | ORAL_TABLET | Freq: Every day | ORAL | Status: DC | PRN

## 2014-04-21 MED ORDER — GI COCKTAIL ~~LOC~~: 30.0000 mL | Freq: Four times a day (QID) | ORAL | Status: DC | PRN

## 2014-04-21 MED ORDER — SODIUM CHLORIDE 0.9 % IJ SOLN: 3.0000 mL | INTRAMUSCULAR | Status: DC | PRN

## 2014-04-21 MED ORDER — ALLOPURINOL 100 MG PO TABS
100.0000 mg | ORAL_TABLET | Freq: Every day | ORAL | Status: DC
  Administered 2014-04-21 – 2014-04-27 (×7): 100 mg via ORAL
  Filled 2014-04-21 (×7): qty 1

## 2014-04-21 MED ORDER — ASPIRIN 81 MG PO CHEW: 81.0000 mg | CHEWABLE_TABLET | ORAL | Status: AC

## 2014-04-21 MED ORDER — HYDROCODONE-ACETAMINOPHEN 5-325 MG PO TABS
1.0000 | ORAL_TABLET | Freq: Four times a day (QID) | ORAL | Status: AC | PRN
  Administered 2014-04-21 – 2014-04-23 (×6): 1 via ORAL
  Filled 2014-04-21 (×6): qty 1

## 2014-04-21 MED ORDER — SPIRONOLACTONE 25 MG PO TABS
25.0000 mg | ORAL_TABLET | Freq: Every day | ORAL | Status: DC
  Administered 2014-04-21 – 2014-04-27 (×6): 25 mg via ORAL
  Filled 2014-04-21 (×7): qty 1

## 2014-04-21 MED ORDER — TORSEMIDE 20 MG PO TABS
60.0000 mg | ORAL_TABLET | Freq: Every morning | ORAL | Status: DC
  Administered 2014-04-21: 60 mg via ORAL
  Filled 2014-04-21: qty 3

## 2014-04-21 MED ORDER — ACETAMINOPHEN 325 MG PO TABS: 650.0000 mg | ORAL_TABLET | ORAL | Status: DC | PRN

## 2014-04-21 MED ORDER — TORSEMIDE 20 MG PO TABS
40.0000 mg | ORAL_TABLET | Freq: Every evening | ORAL | Status: DC
  Filled 2014-04-21: qty 2

## 2014-04-21 MED ORDER — SODIUM CHLORIDE 0.9 % IV SOLN: 250.0000 mL | INTRAVENOUS | Status: DC | PRN

## 2014-04-21 MED ORDER — HYDRALAZINE HCL 50 MG PO TABS
50.0000 mg | ORAL_TABLET | Freq: Three times a day (TID) | ORAL | Status: DC
  Administered 2014-04-21 – 2014-04-27 (×19): 50 mg via ORAL
  Filled 2014-04-21 (×22): qty 1

## 2014-04-21 MED ORDER — ONDANSETRON HCL 4 MG/2ML IJ SOLN: 4.0000 mg | Freq: Four times a day (QID) | INTRAMUSCULAR | Status: DC | PRN

## 2014-04-21 MED ORDER — SIMVASTATIN 40 MG PO TABS
40.0000 mg | ORAL_TABLET | Freq: Every day | ORAL | Status: DC
  Administered 2014-04-21 – 2014-04-26 (×6): 40 mg via ORAL
  Filled 2014-04-21 (×8): qty 1

## 2014-04-21 MED ORDER — APIXABAN 5 MG PO TABS
5.0000 mg | ORAL_TABLET | Freq: Two times a day (BID) | ORAL | Status: DC
  Administered 2014-04-21: 5 mg via ORAL
  Filled 2014-04-21 (×3): qty 1

## 2014-04-21 MED ORDER — SODIUM CHLORIDE 0.9 % IJ SOLN: 3.0000 mL | Freq: Two times a day (BID) | INTRAMUSCULAR | Status: DC

## 2014-04-21 MED ORDER — HEPARIN (PORCINE) IN NACL 100-0.45 UNIT/ML-% IJ SOLN
2250.0000 [IU]/h | INTRAMUSCULAR | Status: DC
  Administered 2014-04-21: 1500 [IU]/h via INTRAVENOUS
  Administered 2014-04-23 (×2): 2250 [IU]/h via INTRAVENOUS
  Filled 2014-04-21 (×7): qty 250

## 2014-04-21 MED ORDER — TRAMADOL HCL 50 MG PO TABS: 50.0000 mg | ORAL_TABLET | Freq: Four times a day (QID) | ORAL | Status: DC | PRN

## 2014-04-21 MED ORDER — ISOSORBIDE MONONITRATE ER 60 MG PO TB24
60.0000 mg | ORAL_TABLET | Freq: Every day | ORAL | Status: DC
  Administered 2014-04-21 – 2014-04-27 (×7): 60 mg via ORAL
  Filled 2014-04-21 (×8): qty 1

## 2014-04-21 MED ORDER — FUROSEMIDE 10 MG/ML IJ SOLN
80.0000 mg | Freq: Two times a day (BID) | INTRAMUSCULAR | Status: DC
  Administered 2014-04-21 – 2014-04-22 (×3): 80 mg via INTRAVENOUS
  Filled 2014-04-21 (×6): qty 8

## 2014-04-21 MED ORDER — POTASSIUM CHLORIDE CRYS ER 20 MEQ PO TBCR
20.0000 meq | EXTENDED_RELEASE_TABLET | Freq: Two times a day (BID) | ORAL | Status: DC
  Administered 2014-04-21 – 2014-04-25 (×10): 20 meq via ORAL
  Filled 2014-04-21 (×15): qty 1

## 2014-04-21 MED ORDER — CARVEDILOL 25 MG PO TABS
25.0000 mg | ORAL_TABLET | Freq: Two times a day (BID) | ORAL | Status: DC
  Administered 2014-04-21 – 2014-04-27 (×13): 25 mg via ORAL
  Filled 2014-04-21 (×16): qty 1

## 2014-04-21 NOTE — Progress Notes (Signed)
Pt walked in the hallway with no problem, Remained in sinus rhythm the all time. Hear heart increased in the 120's. Eric Murillo

## 2014-04-21 NOTE — Progress Notes (Signed)
UR completed 

## 2014-04-21 NOTE — Consult Note (Signed)
Reason for Consult: chest pain Primary Cardiologist: Dr. Aundra Dubin Referring Physician: Dr. Crist Infante Eric Murillo is an 53 y.o. male.  HPI: Mr. Nudelman is a 53 yo man with PMH of dyslipidemia, hypertension, severe OSA intolerant of CPAP, obesity and nonischemic cardiomyopathy with last known EF 25% who recently had a TEE-DCCV for atrial flutter who presents with chest tightness more pronounced with exertion. His creatinine is also noted to be elevated from baseline of 1.4 to 2.0. He characterizes the chest discomfort as a tightness that occurs with walking more than 10-15 feet and promptly resolves with sitting down or standing still. He doesn't note that the discomfort is clearly positional or related to food or timing of food. No nausea/vomiting. No diaphoresis.   Past Medical History  Diagnosis Date  . Hypertension   . Hyperlipidemia   . CHF (congestive heart failure)     EF 50-55%    . Erectile dysfunction   . Obesity   . Gout   . CKD (chronic kidney disease)     Past Surgical History  Procedure Laterality Date  . Colonoscopy    . Salivary gland surgery  09/12/2012  . Cardiac catheterization  2012  . Submandibular gland excision Right 09/12/2012    Procedure: Removal Right Submandibular Joaquim Lai;  Surgeon: Izora Gala, MD;  Location: [facility];  Service: ENT;  Laterality: Right;  . Tee without cardioversion N/A 04/03/2014    Procedure: TRANSESOPHAGEAL ECHOCARDIOGRAM (TEE);  Surgeon: Larey Dresser, MD;  Location: [facility];  Service: Cardiovascular;  Laterality: N/A;  . Cardioversion N/A 04/03/2014    Procedure: CARDIOVERSION;  Surgeon: Larey Dresser, MD;  Location: [facility] ENDOSCOPY;  Service: Cardiovascular;  Laterality: N/A;    Family History  Problem Relation Age of Onset  . Hypertension Mother   . Hypertension Father   . Cancer Father     brother died of brain cancer; sister died of bone cancer  . Diabetes Sister   . Diabetes Brother   . Colon cancer Maternal Aunt   .  Cardiomyopathy Neg Hx     Social History:  reports that he has never smoked. He has never used smokeless tobacco. He reports that he does not drink alcohol or use illicit drugs.  Allergies:  Allergies  Allergen Reactions  . Beta Adrenergic Blockers Other (See Comments)    REACTION: A white colored Beta Blocker made him feel funny.    Medications: I have reviewed the patient's current medications. Prior to Admission:  (Not in a hospital admission) Scheduled:  Results for orders placed during the hospital encounter of 04/20/14 (from the past 48 hour(s))  CBC     Status: Abnormal   Collection Time    04/20/14  9:38 PM      Result Value Ref Range   WBC 10.6 (*) 4.0 - 10.5 K/uL   RBC 4.23  4.22 - 5.81 MIL/uL   Hemoglobin 11.3 (*) 13.0 - 17.0 g/dL   HCT 36.3 (*) 39.0 - 52.0 %   MCV 85.8  78.0 - 100.0 fL   MCH 26.7  26.0 - 34.0 pg   MCHC 31.1  30.0 - 36.0 g/dL   RDW 13.3  11.5 - 15.5 %   Platelets 229  150 - 400 K/uL  COMPREHENSIVE METABOLIC PANEL     Status: Abnormal   Collection Time    04/20/14  9:38 PM      Result Value Ref Range   Sodium 143  137 - 147 mEq/L   Potassium  4.5  3.7 - 5.3 mEq/L   Chloride 102  96 - 112 mEq/L   CO2 26  19 - 32 mEq/L   Glucose, Bld 94  70 - 99 mg/dL   BUN 33 (*) 6 - 23 mg/dL   Creatinine, Ser 5.52 (*) 0.50 - 1.35 mg/dL   Calcium 9.0  8.4 - 17.4 mg/dL   Total Protein 8.1  6.0 - 8.3 g/dL   Albumin 3.8  3.5 - 5.2 g/dL   AST 22  0 - 37 U/L   ALT 20  0 - 53 U/L   Alkaline Phosphatase 77  39 - 117 U/L   Total Bilirubin 0.3  0.3 - 1.2 mg/dL   GFR calc non Af Amer 35 (*) >90 mL/min   GFR calc Af Amer 41 (*) >90 mL/min   Comment: (NOTE)     The eGFR has been calculated using the CKD EPI equation.     This calculation has not been validated in all clinical situations.     eGFR's persistently <90 mL/min signify possible Chronic Kidney     Disease.   Anion gap 15  5 - 15  I-STAT TROPOININ, ED     Status: None   Collection Time    04/20/14  9:43  PM      Result Value Ref Range   Troponin i, poc 0.01  0.00 - 0.08 ng/mL   Comment 3            Comment: Due to the release kinetics of cTnI,     a negative result within the first hours     of the onset of symptoms does not rule out     myocardial infarction with certainty.     If myocardial infarction is still suspected,     repeat the test at appropriate intervals.    Dg Chest 2 View  04/20/2014   CLINICAL DATA:  Chest pain, shortness of breath  EXAM: CHEST  2 VIEW  COMPARISON:  Prior radiograph from 03/20/2014  FINDINGS: Cardiac silhouette is moderately enlarged. Mediastinal silhouette within normal limits.  Lungs are mildly hypoinflated with elevation of the right hemidiaphragm, similar to prior. Mild central perihilar vascular congestion present. No pulmonary edema or pleural effusion. There is no focal infiltrate. No pneumothorax.  No acute osseus abnormality.  IMPRESSION: Stable cardiomegaly with no acute cardiopulmonary abnormality identified.   Electronically Signed   By: Rise Mu M.D.   On: 04/20/2014 22:08    Review of Systems  Constitutional: Positive for malaise/fatigue. Negative for fever, chills and weight loss.  HENT: Negative for ear pain and hearing loss.   Eyes: Negative for blurred vision, photophobia and pain.  Respiratory: Negative for cough and sputum production.   Cardiovascular: Positive for chest pain, palpitations and leg swelling. Negative for orthopnea.  Gastrointestinal: Negative for heartburn, abdominal pain and diarrhea.  Genitourinary: Negative for dysuria and hematuria.  Musculoskeletal: Negative for back pain and myalgias.  Skin: Negative for rash.  Neurological: Negative for dizziness, tingling, sensory change and headaches.  Endo/Heme/Allergies: Negative for polydipsia.  Psychiatric/Behavioral: Negative for suicidal ideas and hallucinations.   Blood pressure 136/77, pulse 91, temperature 99.1 F (37.3 C), resp. rate 19, SpO2  96.00%. Physical Exam  Nursing note and vitals reviewed. Constitutional: He is oriented to person, place, and time. He appears well-developed and well-nourished. No distress.  HENT:  Head: Normocephalic and atraumatic.  Nose: Nose normal.  Mouth/Throat: Oropharynx is clear and moist. No oropharyngeal exudate.  Eyes: Conjunctivae  and EOM are normal. Pupils are equal, round, and reactive to light. No scleral icterus.  Neck: Normal range of motion. Neck supple. JVD present. No tracheal deviation present.  JVP 2 cm above clavicle with slight HJR  Cardiovascular: Normal rate, regular rhythm, normal heart sounds and intact distal pulses.  Exam reveals no gallop.   No murmur heard. Respiratory: Effort normal and breath sounds normal. No respiratory distress. He has no wheezes.  GI: Soft. Bowel sounds are normal. He exhibits no distension. There is no tenderness. There is no rebound.  Musculoskeletal: Normal range of motion. He exhibits no edema and no tenderness.  Neurological: He is alert and oriented to person, place, and time. No cranial nerve deficit. Coordination normal.  Skin: Skin is warm and dry. No rash noted. He is not diaphoretic. No erythema.  Psychiatric: He has a normal mood and affect. His behavior is normal. Thought content normal.  9/11 TEE with global hypokinesis, EF 25%, mild to moderate MR, moderate LAE with normal RV size and mild hypokinesis, negative bubble study Bun/cr 33/2.06, K 4.5, plt 229, h/h 11.3/36, wbc 10.6, Trop I 0.01, ProBNP 819 ECG: SR, LAD, PVC, LAFB  Assessment/Plan: Mr. Javid is a 53 yo man with PMH of stage II/III CKD, hypertension, dyslipideia, OSA, and nonischemic chronic systolic heart failure who recently had a TEE DCCV for atrial fibrillation who presents with chest pain. Differential is broad. Typical and atypical components with pain worse with movement. He feels like he may have gained some weight.  1. Chest Pain: differential includes ischemic,  pericarditis, heart failure/volume overload, musculoskeletal pain, palpitations, esophageal spasm, among other etiologies. Given risk factors and age reasonable to evaluate/observe overnight with telemetry, trend cardiac markers. Negative LHC 2012 with previous clean LHC. Can consider value of stress test in AM based on symptoms/findings.  2. Chronic systolic heart failure: continue home coreg 25 mg bid, spironolactone 25 mg daily and hydralazine/imdur 50 mg tid/60 mg daily - no ACE/ARB given renal function currently  - holding torsemide tonight; recheck BMP/Cr in AM: baseline torsemide is 40 mg bid with KCL of 20 meq 3. Chronic Kidney disease: avoid NSAIDS, renally dose medications.  4. Obstructive Sleep Apnea: intolerant/not compliant with CPAP 5. Gout: stable currently (had episode just a few days ago) 6. Atrial flutter: CHADS2VASC of 2 (HF, hypertension) and very symptomatic, currently in sinus rhythm. On coreg for rate control. Can also discuss with EP. Continue anticoagulation with apixaban.      Ahliya Glatt 04/21/2014, 12:43 AM

## 2014-04-21 NOTE — Progress Notes (Signed)
ANTICOAGULATION CONSULT NOTE - Initial Consult  Pharmacy Consult for Heparin Indication:   Allergies  Allergen Reactions  . Beta Adrenergic Blockers Other (See Comments)    REACTION: A white colored Beta Blocker made him feel funny.    Patient Measurements: Height: 5' 9.5" (176.5 cm) Weight: 329 lb 6.4 oz (149.415 kg) IBW/kg (Calculated) : 71.85 Heparin Dosing Weight: 108 kg  Vital Signs: Temp: 98.5 F (36.9 C) (09/29 1210) Temp src: Oral (09/29 1210) BP: 112/78 mmHg (09/29 1210) Pulse Rate: 81 (09/29 1210)  Labs:  Recent Labs  04/20/14 2138 04/21/14 0249 04/21/14 0642 04/21/14 0840  HGB 11.3*  --   --  11.1*  HCT 36.3*  --   --  35.1*  PLT 229  --   --  239  CREATININE 2.06*  --   --  1.70*  TROPONINI  --  <0.30 <0.30  --     Estimated Creatinine Clearance: 73.1 ml/min (by C-G formula based on Cr of 1.7).   Medical History: Past Medical History  Diagnosis Date  . Hypertension   . Hyperlipidemia   . CHF (congestive heart failure)     EF 50-55%    . Erectile dysfunction   . Obesity   . Gout   . CKD (chronic kidney disease)     Assessment: CC: chest pain  HPI: SOB on exertion. In a fib. DCCV on 09/11 w/ Eliquis. Followed by Dr. Shirlee Latch in clinic. Recently seen in clinic w/ volume overload. Only able to ambulate a few steps. Does not have symptoms at rest. Two pillow orthopnea.   PMH: obesity, HLD, HTN, gout, CKD, cardiomyopathy, A fib  Anticoag: Afib on Eliquis PTA. Now transition to heparin with plans for cath.  Cards: Cardiomyopathy,HTN, HLD,  EF 04/03/14: 25-30%, DCCV 9/15. Troponins negative x 2. VSS. Still c/o CP. Meds: ASA325, Coreg, po Lasix, hydralazine, Imdur, K+, Zocor , spironolatone  Endo: Pt c/o gout-like symptoms in ankle. Allopurinol  Renal: CKD: Baseline Cr 1.7. Hold ACE.  Resp: OSA  Anemia: baseline hgb 11.3 down from 13.3 a year ago.  Iron 17, TIBC 274, Ferritin 540 elevated, Folate 12.9,  B12 343   Goal of Therapy:  Heparin  level 0.3-0.7 units/ml Monitor platelets by anticoagulation protocol: Yes   Plan:  Cath Thursday to w/u CP. At 2200 (when next Eliquis would be due), start IV heparin 1500 units/hr Will check heparin level and CBC daily.  Lucilia Yanni S. Merilynn Finland, PharmD, BCPS Clinical Staff Pharmacist Pager [phone]  Misty Stanley Stillinger 04/21/2014,2:26 PM

## 2014-04-21 NOTE — H&P (Signed)
  Date: 04/21/2014  Patient name: Eric Murillo  Medical record number: [id number]  Date of birth: 1960-11-07   I have seen and evaluated [facility] and discussed their care with the Residency Team.  Briefly, Mr. Schorer is a 53yo man who presented with exertional chest pain and SOB along with some worsening AKI in the setting of diuretic therapy.  He also complained of right ankle pain thought to be due to gout.   Assessment and Plan: I have seen and evaluated the patient as outlined above. I agree with the formulated Assessment and Plan as detailed in the residents' admission note, with the following changes:   1. Exertional CP/SOB - Admit for ACS rule out, Consult Cardiology and consider stress testing given normal sinus rhythm on telemetry thus far.  - Telemetry with ambulation to assess for recurrent a flutter  2. NICM - Continue home medications and monitor renal function - Follow cardiology recommendations.   3. CKD - Baseline apparently close to 1.4, up to 2 here.  Monitor closely.  Avoid nephrotoxins.   Other issues per resident note.   Inez Catalina, MD 9/29/20151:27 PM

## 2014-04-21 NOTE — Progress Notes (Signed)
Agree with [facility] note see my note for more details  Shirlee Latch MD

## 2014-04-21 NOTE — Discharge Instructions (Addendum)
Please take your medications as prescribed on this paper.  Note: Continue to take Colchicine 0.6mg  twice a day for your gout pain  We will have your follow up in the clinic on 04/30/14 with Dr. Sherrine MaplesGlenn and on 05/04/14 with Dr. Shirlee LatchMcLean (cardiology)      Information on my medicine - ELIQUIS (apixaban)  This medication education was reviewed with me or my healthcare representative as part of my discharge preparation.  The pharmacist that spoke with me during my hospital stay was:  Pasty Spillersobertson, Crystal Stillinger, [facility]  Why was Eliquis prescribed for you? Eliquis was prescribed for you to reduce the risk of a blood clot forming that can cause a stroke if you have a medical condition called atrial fibrillation (a type of irregular heartbeat).  What do You need to know about Eliquis ? Take your Eliquis TWICE DAILY - one tablet in the morning and one tablet in the evening with or without food. If you have difficulty swallowing the tablet whole please discuss with your pharmacist how to take the medication safely.  Take Eliquis exactly as prescribed by your doctor and DO NOT stop taking Eliquis without talking to the doctor who prescribed the medication.  Stopping may increase your risk of developing a stroke.  Refill your prescription before you run out.  After discharge, you should have regular check-up appointments with your healthcare provider that is prescribing your Eliquis.  In the future your dose may need to be changed if your kidney function or weight changes by a significant amount or as you get older.  What do you do if you miss a dose? If you miss a dose, take it as soon as you remember on the same day and resume taking twice daily.  Do not take more than one dose of ELIQUIS at the same time to make up a missed dose.  Important Safety Information A possible side effect of Eliquis is bleeding. You should call your healthcare provider right away if you experience any of the  following:   Bleeding from an injury or your nose that does not stop.   Unusual colored urine (red or dark brown) or unusual colored stools (red or black).   Unusual bruising for unknown reasons.   A serious fall or if you hit your head (even if there is no bleeding).  Some medicines may interact with Eliquis and might increase your risk of bleeding or clotting while on Eliquis. To help avoid this, consult your healthcare provider or pharmacist prior to using any new prescription or non-prescription medications, including herbals, vitamins, non-steroidal anti-inflammatory drugs (NSAIDs) and supplements.  This website has more information on Eliquis (apixaban): http://www.eliquis.com/eliquis/home   Gout Gout is when your joints become red, sore, and swell (inflamed). This is caused by the buildup of uric acid crystals in the joints. Uric acid is a chemical that is normally in the blood. If the level of uric acid gets too high in the blood, these crystals form in your joints and tissues. Over time, these crystals can form into masses near the joints and tissues. These masses can destroy bone and cause the bone to look misshapen (deformed). HOME CARE   Do not take aspirin for pain.  Only take medicine as told by your doctor.  Rest the joint as much as you can. When in bed, keep sheets and blankets off painful areas.  Keep the sore joints raised (elevated).  Put warm or cold packs on painful joints. Use of warm or  cold packs depends on which works best for you.  Use crutches if the painful joint is in your leg.  Drink enough fluids to keep your pee (urine) clear or pale yellow. Limit alcohol, sugary drinks, and drinks with fructose in them.  Follow your diet instructions. Pay careful attention to how much protein you eat. Include fruits, vegetables, whole grains, and fat-free or low-fat milk products in your daily diet. Talk to your doctor or dietitian about the use of coffee, vitamin C,  and cherries. These may help lower uric acid levels.  Keep a healthy body weight. GET HELP RIGHT AWAY IF:   You have watery poop (diarrhea), throw up (vomit), or have any side effects from medicines.  You do not feel better in 24 hours, or you are getting worse.  Your joint becomes suddenly more tender, and you have chills or a fever. MAKE SURE YOU:   Understand these instructions.  Will watch your condition.  Will get help right away if you are not doing well or get worse. Document Released: 04/18/2008 Document Revised: 11/24/2013 Document Reviewed: 02/21/2012 [facility] Patient Information 2015 [place], [place]. This information is not intended to replace advice given to you by your health care provider. Make sure you discuss any questions you have with your health care provider.  Heart Failure Heart failure means your heart has trouble pumping blood. This makes it hard for your body to work well. Heart failure is usually a long-term (chronic) condition. You must take good care of yourself and follow your doctor's treatment plan. HOME CARE  Take your heart medicine as told by your doctor.  Do not stop taking medicine unless your doctor tells you to.  Do not skip any dose of medicine.  Refill your medicines before they run out.  Take other medicines only as told by your doctor or pharmacist.  Stay active if told by your doctor. The elderly and people with severe heart failure should talk with a doctor about physical activity.  Eat heart-healthy foods. Choose foods that are without trans fat and are low in saturated fat, cholesterol, and salt (sodium). This includes fresh or frozen fruits and vegetables, fish, lean meats, fat-free or low-fat dairy foods, whole grains, and high-fiber foods. Lentils and dried peas and beans (legumes) are also good choices.  Limit salt if told by your doctor.  Cook in a healthy way. Roast, grill, broil, bake, poach, steam, or stir-fry  foods.  Limit fluids as told by your doctor.  Weigh yourself every morning. Do this after you pee (urinate) and before you eat breakfast. Write down your weight to give to your doctor.  Take your blood pressure and write it down if your doctor tells you to.  Ask your doctor how to check your pulse. Check your pulse as told.  Lose weight if told by your doctor.  Stop smoking or chewing tobacco. Do not use gum or patches that help you quit without your doctor's approval.  Schedule and go to doctor visits as told.  Nonpregnant women should have no more than 1 drink a day. Men should have no more than 2 drinks a day. Talk to your doctor about drinking alcohol.  Stop illegal drug use.  Stay current with shots (immunizations).  Manage your health conditions as told by your doctor.  Learn to manage your stress.  Rest when you are tired.  If it is really hot outside:  Avoid intense activities.  Use air conditioning or fans, or get in  a cooler place.  Avoid caffeine and alcohol.  Wear loose-fitting, lightweight, and light-colored clothing.  If it is really cold outside:  Avoid intense activities.  Layer your clothing.  Wear mittens or gloves, a hat, and a scarf when going outside.  Avoid alcohol.  Learn about heart failure and get support as needed.  Get help to maintain or improve your quality of life and your ability to care for yourself as needed. GET HELP IF:   You gain 03 lb/1.4 kg or more in 1 day or 05 lb/2.3 kg in a week.  You are more short of breath than usual.  You cannot do your normal activities.  You tire easily.  You cough more than normal, especially with activity.  You have any or more puffiness (swelling) in areas such as your hands, feet, ankles, or belly (abdomen).  You cannot sleep because it is hard to breathe.  You feel like your heart is beating fast (palpitations).  You get dizzy or light-headed when you stand up. GET HELP RIGHT  AWAY IF:   You have trouble breathing.  There is a change in mental status, such as becoming less alert or not being able to focus.  You have chest pain or discomfort.  You faint. MAKE SURE YOU:   Understand these instructions.  Will watch your condition.  Will get help right away if you are not doing well or get worse. Document Released: 04/18/2008 Document Revised: 11/24/2013 Document Reviewed: 08/26/2012 [facility] Patient Information 2015 [place], [place]. This information is not intended to replace advice given to you by your health care provider. Make sure you discuss any questions you have with your health care provider.

## 2014-04-21 NOTE — Progress Notes (Addendum)
SUBJECTIVE: Still with complains of intermittent chest tightness when he gets up to go to the restroom  OBJECTIVE:   Vitals:   Filed Vitals:   04/21/14 0558 04/21/14 0758 04/21/14 1002 04/21/14 1210  BP: 122/75 130/88 131/70 112/78  Pulse: 92 105  81  Temp: 99 F (37.2 C) 99.4 F (37.4 C)  98.5 F (36.9 C)  TempSrc: Oral Oral  Oral  Resp: 18 18  18   Height:      Weight: 329 lb 6.4 oz (149.415 kg)     SpO2: 95% 95%  93%   I&O's:   Intake/Output Summary (Last 24 hours) at 04/21/14 1400 Last data filed at 04/21/14 1300  Gross per 24 hour  Intake    600 ml  Output   2175 ml  Net  -1575 ml   TELEMETRY: Reviewed telemetry pt in NSR:     PHYSICAL EXAM General: Well developed, well nourished, in no acute distress Head: Eyes PERRLA, No xanthomas.   Normal cephalic and atramatic  Lungs:   Clear bilaterally to auscultation and percussion. Heart:   HRRR S1 S2 Pulses are 2+ & equal. Abdomen: Bowel sounds are positive, abdomen soft and non-tender without masses Extremities:   Trace edema Neuro: Alert and oriented X 3. Psych:  Good affect, responds appropriately   LABS: Basic Metabolic Panel:  Recent Labs  53/96/72 2138 04/21/14 0840  NA 143 144  K 4.5 4.3  CL 102 104  CO2 26 28  GLUCOSE 94 113*  BUN 33* 28*  CREATININE 2.06* 1.70*  CALCIUM 9.0 9.1   Liver Function Tests:  Recent Labs  04/20/14 2138  AST 22  ALT 20  ALKPHOS 77  BILITOT 0.3  PROT 8.1  ALBUMIN 3.8   No results found for this basename: LIPASE, AMYLASE,  in the last 72 hours CBC:  Recent Labs  04/20/14 2138 04/21/14 0840  WBC 10.6* 12.1*  NEUTROABS  --  8.1*  HGB 11.3* 11.1*  HCT 36.3* 35.1*  MCV 85.8 86.0  PLT 229 239   Cardiac Enzymes:  Recent Labs  04/21/14 0249 04/21/14 0642  TROPONINI <0.30 <0.30   BNP: No components found with this basename: POCBNP,  D-Dimer: No results found for this basename: DDIMER,  in the last 72 hours Hemoglobin A1C: No results found for  this basename: HGBA1C,  in the last 72 hours Fasting Lipid Panel: No results found for this basename: CHOL, HDL, LDLCALC, TRIG, CHOLHDL, LDLDIRECT,  in the last 72 hours Thyroid Function Tests: No results found for this basename: TSH, T4TOTAL, FREET3, T3FREE, THYROIDAB,  in the last 72 hours Anemia Panel:  Recent Labs  04/21/14 0303  VITAMINB12 343  FOLATE 12.9  FERRITIN 540*  TIBC 274  IRON 17*  RETICCTPCT 2.0   Coag Panel:   Lab Results  Component Value Date   INR 1.2* 10/07/2010    RADIOLOGY: Dg Chest 2 View  04/20/2014   CLINICAL DATA:  Chest pain, shortness of breath  EXAM: CHEST  2 VIEW  COMPARISON:  Prior radiograph from 03/20/2014  FINDINGS: Cardiac silhouette is moderately enlarged. Mediastinal silhouette within normal limits.  Lungs are mildly hypoinflated with elevation of the right hemidiaphragm, similar to prior. Mild central perihilar vascular congestion present. No pulmonary edema or pleural effusion. There is no focal infiltrate. No pneumothorax.  No acute osseus abnormality.  IMPRESSION: Stable cardiomegaly with no acute cardiopulmonary abnormality identified.   Electronically Signed   By: Rise Mu M.D.   On: 04/20/2014 22:08  Assessment/Plan:  Mr. Bascom LevelsFrazier is a 53 yo man with PMH of stage II/III CKD, hypertension, dyslipidemia, OSA, and nonischemic chronic systolic heart failure who recently had a TEE DCCV for atrial fibrillation who presents with chest pain. Differential is broad. Typical and atypical components with pain worse with movement. He feels like he may have gained some weight.  1. Chest Pain: differential includes ischemic, pericarditis, heart failure/volume overload, musculoskeletal pain,  esophageal spasm, . Negative LHC 2012 with previous clean LHC. Cardiac enzymes neg x 2. He has been complaining recently in office of intermittent exertional chest pain and SOB.  Will plan right and left heart cath in 48 hours off apixiban. 2. Chronic systolic  heart failure: continue home coreg 25 mg bid, spironolactone 25 mg daily and hydralazine/imdur.  BNP mildly elevated from compared to last discharge.  He also complains of increased SOB and ankle swelling.  His weight is up from baseline (317 in clinic on 03/24/2014 and now 329lbs.).  He has diuresed 1.5L since admission despite holding Demadex. - Will ask AHF team to see - no ACE/ARB given renal function currently  - continue Coreg/Hydralazine/aldactone/nitrates - Torsemide held last PM due to worsening renal function.  Creatinine back down today.  -  Discussed with Dr. Shirlee LatchMcLean - will start lasix 80mg  IV BID and stop PO Torsemide. - set up for right and left heart cath Thursday (on apixiban so will need to hold for 48 hours) 3. Chronic Kidney disease: avoid NSAIDS, renally dose medications.  4. Obstructive Sleep Apnea: intolerant/not compliant with CPAP  5. Gout: stable currently (had episode just a few days ago)  6. Atrial flutter: CHADS2VASC of 2 (HF, hypertension) and very symptomatic, currently in sinus rhythm. On coreg for rate control.  Will d/c Apixiban and transition to IV Heparin per pharmacy in preparation for cath Thursday.  I spent a total of 30 minutes in direct patient care with this visit.   Quintella ReichertURNER,TRACI R, MD  04/21/2014  2:00 PM

## 2014-04-21 NOTE — Progress Notes (Signed)
Subjective: Pt reports left chest pain improved. He had left chest pain (tightness) yesterday w/o radiation associated with sob with exertion but denied nausea, vomiting, sweating.  He reports his legs are swollen more than normal.  He just had a gout flare walking on crutches and requests Norco for pain as Ultram does not work.    Objective: Vital signs in last 24 hours: Filed Vitals:   04/21/14 0558 04/21/14 0758 04/21/14 1002 04/21/14 1210  BP: 122/75 130/88 131/70 112/78  Pulse: 92 105  81  Temp: 99 F (37.2 C) 99.4 F (37.4 C)  98.5 F (36.9 C)  TempSrc: Oral Oral  Oral  Resp: 18 18  18   Height:      Weight: 329 lb 6.4 oz (149.415 kg)     SpO2: 95% 95%  93%   Weight change:   Intake/Output Summary (Last 24 hours) at 04/21/14 1307 Last data filed at 04/21/14 0914  Gross per 24 hour  Intake    600 ml  Output   1575 ml  Net   -975 ml   Vitals reviewed. General: resting in bed, NAD HEENT:[place]/at no scleral icterus Cardiac: RRR, no rubs, murmurs or gallops Pulm: clear to auscultation bilaterally, no wheezes, rales, or rhonchi Abd: soft, nontender, nondistended, BS present Ext: warm and well perfused, 1-2+ pedal edema b/l  Neuro: alert and oriented X3, cranial nerves II-XII grossly intact MSK: mild ttp medial mallelolus and talar region. Mild warmth, no significant swelling.  No restriction with range of motion or pain with ROM  Lab Results: Basic Metabolic Panel:  Recent Labs Lab 04/20/14 2138 04/21/14 0840  NA 143 144  K 4.5 4.3  CL 102 104  CO2 26 28  GLUCOSE 94 113*  BUN 33* 28*  CREATININE 2.06* 1.70*  CALCIUM 9.0 9.1   Liver Function Tests:  Recent Labs Lab 04/20/14 2138  AST 22  ALT 20  ALKPHOS 77  BILITOT 0.3  PROT 8.1  ALBUMIN 3.8   CBC:  Recent Labs Lab 04/20/14 2138 04/21/14 0840  WBC 10.6* 12.1*  NEUTROABS  --  8.1*  HGB 11.3* 11.1*  HCT 36.3* 35.1*  MCV 85.8 86.0  PLT 229 239   Cardiac Enzymes:  Recent Labs Lab  04/21/14 0249 04/21/14 0642  TROPONINI <0.30 <0.30   BNP:  Recent Labs Lab 04/20/14 2138  PROBNP 1869.0*   Anemia Panel:  Recent Labs Lab 04/21/14 0303  VITAMINB12 343  FOLATE 12.9  FERRITIN 540*  TIBC 274  IRON 17*  RETICCTPCT 2.0   Urine Drug Screen: Drugs of Abuse  No results found for this basename: labopia, cocainscrnur, labbenz, amphetmu, thcu, labbarb    Misc. Labs: Trop x 1, UDS  Micro Results: No results found for this or any previous visit (from the past 240 hour(s)). Studies/Results: Dg Chest 2 View  04/20/2014   CLINICAL DATA:  Chest pain, shortness of breath  EXAM: CHEST  2 VIEW  COMPARISON:  Prior radiograph from 03/20/2014  FINDINGS: Cardiac silhouette is moderately enlarged. Mediastinal silhouette within normal limits.  Lungs are mildly hypoinflated with elevation of the right hemidiaphragm, similar to prior. Mild central perihilar vascular congestion present. No pulmonary edema or pleural effusion. There is no focal infiltrate. No pneumothorax.  No acute osseus abnormality.  IMPRESSION: Stable cardiomegaly with no acute cardiopulmonary abnormality identified.   Electronically Signed   By: Rise Mu M.D.   On: 04/20/2014 22:08   Medications:  Scheduled Meds: . allopurinol  100 mg Oral Daily  .  apixaban  5 mg Oral BID  . aspirin EC  325 mg Oral Daily  . carvedilol  25 mg Oral BID WC  . hydrALAZINE  50 mg Oral TID  . isosorbide mononitrate  60 mg Oral Daily  . potassium chloride SA  20 mEq Oral BID  . simvastatin  40 mg Oral QHS  . spironolactone  25 mg Oral Daily  . torsemide  40 mg Oral QPM  . torsemide  60 mg Oral q morning - 10a   Continuous Infusions:  PRN Meds:.acetaminophen, gi cocktail, HYDROcodone-acetaminophen, ondansetron (ZOFRAN) IV Assessment/Plan: 53 y.o PMH obesity, HLD, nonischemic chronic systolic heart failure, HTN, atrial flutter (s/p TEE DCCV 04/03/14), severe OSA, CKD presented with chest pain with exertion.     #Chest pain on exertion -typical and atypical features will r/o ACS, consider A/C systolic CHF -Patient with history of nonischemic cardiomyopathy thought 2/2 HTN, chronic systolic heart failure (EF 25-30% while in Atrial flutter 03/2014) -cardiology following with recs(i.e should EP see the patient, will the patient have stress test inpatient?) -continue medical management with home medications (Coreg 25 mg bid, Hydralazine 50 mg tid, Imdur 60, Spironolactone 25 mg qd) -diurese with Lasix instead of Torsemide  -strict i/o, daily weights, pending repeat EKG, pending UDS -Trop neg x 2 will trend 1 more  -[facility]  #Possible acute on chronic systolic heart failure -weight is up 3-8 lbs from 01/2014 and 3 lbs from 04/14/14, proBNP elevated from 9/22, CXR with mild central perihilar vascular congestion present -continue medical management with home medications (Coreg 25 mg bid, Hydralazine 50 mg tid, Imdur 60, Spironolactone 25 mg qd) -cards started Lasix 80 mg bid iv and stopped Torsemide  -Strict intake and output, daily weights  -pending heart failure team to see   #Atrial flutter -CHADSVASC 2. Currently in NSR -telemetry with PVCs, missed beats, asystole, VF/VT since admission -ambulated today and patient was in NSR -continue Coreg  -d/c Eliquis and placed on Heparin -? If EP should see the patient this admission.    #HYPERTENSION -controlled -continue to monitor  #acute on CKD (chronic kidney disease) stage 3 -BL Creatinine 1.4-1.5. Creatinine 1.7 today down from 2.06  -avoiding NSAIDs, ACEI  #normocytic anemia  -anemia panel resulted likely anemia of chronic disease with low Fe, saturation, elevated Ferritin   #OSA (obstructive sleep apnea) -cpap qhs  #Gout -recent gout flare  -will add prn Norco, d/c Ultram  -continue Allopurinol   #F/E/N -NSL -will trend electrolytes. K repletion with Kdur 20 mg bid  -cardiac diet   #DVT px   -Eliquis dc'ed and given IV heparin, scds    Dispo: Disposition is deferred at this time, awaiting improvement of current medical problems.  Anticipated discharge in approximately 1-2 day(s).   The patient does have a current PCP Luisa Dago(Everett Moding, MD) and does not [facility] after discharge.  The patient does not have transportation limitations that hinder transportation to clinic appointments.  .Services Needed at time of discharge: Y = Yes, Blank = No PT:   OT:   RN:   Equipment:   Other:     LOS: 1 day   Annett Gularacy N McLean, MD [phone] 04/21/2014, 1:07 PM

## 2014-04-21 NOTE — Progress Notes (Signed)
Subjective: Eric Murillo reports some improvement of his chest pain today, however continues to experience mild exertional chest tightness. He also endorses SOB with exertion and increased swelling in his ankles. He complains of R ankle pain secondary to acute gout flare for the past 2 days. Pain was not relieved with tramadol.   Objective: Vital signs in last 24 hours: Filed Vitals:   04/21/14 0153 04/21/14 0558 04/21/14 0758 04/21/14 1002  BP: 119/65 122/75 130/88 131/70  Pulse: 87 92 105   Temp: 98.6 F (37 C) 99 F (37.2 C) 99.4 F (37.4 C)   TempSrc: Oral Oral Oral   Resp: 20 18 18    Height: 5' 9.5" (1.765 m)     Weight: 149.233 kg (329 lb) 149.415 kg (329 lb 6.4 oz)    SpO2: 94% 95% 95%    Weight change:   Intake/Output Summary (Last 24 hours) at 04/21/14 1117 Last data filed at 04/21/14 0914  Gross per 24 hour  Intake    600 ml  Output   1575 ml  Net   -975 ml   BP 131/70  Pulse 105  Temp(Src) 99.4 F (37.4 C) (Oral)  Resp 18  Ht 5' 9.5" (1.765 m)  Wt 149.415 kg (329 lb 6.4 oz)  BMI 47.96 kg/m2  SpO2 95%  General Appearance:    Alert, cooperative, no distress, appears stated age  Head:    Normocephalic, atraumatic  Lungs:     Clear to auscultation bilaterally, respirations unlabored  Chest wall:    No tenderness or deformity  Heart:    Regular rate and rhythm, S1 and S2 normal, no murmur, rub   or gallop  Abdomen:     Soft, non-tender  Extremities:   2+ pitting edema to mid-calf bilaterally  Pulses:   2+ and symmetric all extremities  Skin:   Skin color, texture, turgor normal, no rashes or lesions  Neurologic:   CNII-XII intact. Normal strength and sensation in all extremities   Lab Results:  Ref. Range 04/21/2014 08:40  Sodium Latest Range: 137-147 mEq/L 144  Potassium Latest Range: 3.7-5.3 mEq/L 4.3  Chloride Latest Range: 96-112 mEq/L 104  CO2 Latest Range: 19-32 mEq/L 28  BUN Latest Range: 6-23 mg/dL 28 (H)  Creatinine Latest Range: 0.50-1.35 mg/dL  1.611.70 (H)  Calcium Latest Range: 8.4-10.5 mg/dL 9.1  GFR calc non Af Amer Latest Range: >90 mL/min 44 (L)  GFR calc Af Amer Latest Range: >90 mL/min 51 (L)  Glucose Latest Range: 70-99 mg/dL 096113 (H)  Anion gap Latest Range: 5-15  12  WBC Latest Range: 4.0-10.5 K/uL 12.1 (H)  RBC Latest Range: 4.22-5.81 MIL/uL 4.08 (L)  Hemoglobin Latest Range: 13.0-17.0 g/dL 04.511.1 (L)  HCT Latest Range: 39.0-52.0 % 35.1 (L)  MCV Latest Range: 78.0-100.0 fL 86.0  MCH Latest Range: 26.0-34.0 pg 27.2  MCHC Latest Range: 30.0-36.0 g/dL 40.931.6  RDW Latest Range: 11.5-15.5 % 13.4  Platelets Latest Range: 150-400 K/uL 239  Neutrophils Relative % Latest Range: 43-77 % 66  Lymphocytes Relative Latest Range: 12-46 % 19  Monocytes Relative Latest Range: 3-12 % 12  Eosinophils Relative Latest Range: 0-5 % 3  Basophils Relative Latest Range: 0-1 % 0  NEUT# Latest Range: 1.7-7.7 K/uL 8.1 (H)  Lymphocytes Absolute Latest Range: 0.7-4.0 K/uL 2.3  Monocytes Absolute Latest Range: 0.1-1.0 K/uL 1.4 (H)  Eosinophils Absolute Latest Range: 0.0-0.7 K/uL 0.3  Basophils Absolute Latest Range: 0.0-0.1 K/uL 0.0    Ref. Range 04/20/2014 21:38 04/20/2014 21:43 04/20/2014 22:00 04/21/2014  02:49 04/21/2014 03:03 04/21/2014 06:42  Troponin I Latest Range: <0.30 ng/mL    <0.30  <0.30  Troponin i, poc Latest Range: 0.00-0.08 ng/mL  0.01      Pro B Natriuretic peptide (BNP) Latest Range: 0-125 pg/mL 1869.0 (H)        Micro Results: No results found for this or any previous visit (from the past 240 hour(s)). Studies/Results: Dg Chest 2 View  04/20/2014   CLINICAL DATA:  Chest pain, shortness of breath  EXAM: CHEST  2 VIEW  COMPARISON:  Prior radiograph from 03/20/2014  FINDINGS: Cardiac silhouette is moderately enlarged. Mediastinal silhouette within normal limits.  Lungs are mildly hypoinflated with elevation of the right hemidiaphragm, similar to prior. Mild central perihilar vascular congestion present. No pulmonary edema or pleural  effusion. There is no focal infiltrate. No pneumothorax.  No acute osseus abnormality.  IMPRESSION: Stable cardiomegaly with no acute cardiopulmonary abnormality identified.   Electronically Signed   By: Rise Mu M.D.   On: 04/20/2014 22:08   Medications: I have reviewed the patient's current medications. Scheduled Meds: . allopurinol  100 mg Oral Daily  . apixaban  5 mg Oral BID  . aspirin EC  325 mg Oral Daily  . carvedilol  25 mg Oral BID WC  . hydrALAZINE  50 mg Oral TID  . isosorbide mononitrate  60 mg Oral Daily  . potassium chloride SA  20 mEq Oral BID  . simvastatin  40 mg Oral QHS  . spironolactone  25 mg Oral Daily  . torsemide  40 mg Oral QPM  . torsemide  60 mg Oral q morning - 10a   Continuous Infusions:  PRN Meds:.acetaminophen, gi cocktail, ondansetron (ZOFRAN) IV, traMADol Assessment/Plan: Principal Problem:   Chest pain on exertion Active Problems:   HYPERTENSION   CKD (chronic kidney disease) stage 3, GFR 30-59 ml/min   Chronic systolic heart failure   Cardiomyopathy, nonischemic   OSA (obstructive sleep apnea)  Eric Murillo is a 53 yo man with PMH of stage II/III CKD, hypertension, hyperlipidemia, OSA, and nonischemic chronic systolic heart failure (EF 25-35%) who recently had a TEE DCCV for atrial fibrillation who presents with chest pain. Unstable angina most likely given exertional chest tightness and SOB and normal troponin, however differential also includes Cardiac arrythmia, GERD, Asthma, Anxiety.   #Exertional chest pain and shortness of breath  Similar to previous symptoms when in atrial fibrillation, but in sinus rhythm currently at rest and with ambulation. Troponin negative x3. Telemetry shows frequents PVCs. Cath on 09/2010 showed no angiographic CAD. Does have some indigestion and not on PPI. HIV Ab nonreactive. Question whether this could be secondary to asthma or anxiety as well.  -UDS -Repeat EKG -Cardiology consulted to discuss possible  stress testing -GI cocktail PRN for indigestion, Zofran PRN.   #Nonischemic cardiomyopathy  Likely due to long-standing hypertension given no ischemic etiology in past. EF reduced on recent TEE, but likely related to elevated rate from atrial fibrillation. He has been following closely with the heart failure clinic due to increased volume overload over the past couple of months. This could also be related to decreased heart function overall. Weight up today 3 lbs from last measurement.  -Continue home Coreg 25 mg BID, hydralazine 50 mg TID, Imdur 60 mg daily  -Continue home Aldactone 25 mg daily, torsemide 60 mg AM and 40 mg PM, and Kdur 20 mEq BID starting tomorrow.  -Daily weights, I/O.   #Paroxysmal atrial flutter  In sinus currently. Scheduled  to see EP in October to discuss ablation.  -Continue Eliquis 5 mg daily.   #CKD, stage 2  Baseline creatinine 1.4. Trended down to 1.7 today from 2.0 yesterday. Likely elevated in the setting of increased diuresis with torsemide increased by heart failure clinic.  -Continue to hold ACE inhibitor.   #Gout  Seen in ER three days ago for right foot and ankle pain. Avoiding NSAIDs due to kidney dysfunction. Patient reports tramadol does not provide adequate pain relief.  -Continue home allopurinol 100 mg daily.  -Norco 5-325mg  q6 PRN   #Hyperlipidemia  -Continue home Zocor 40 mg daily.   #Obstructive sleep apnea  Reports not using CPAP due to mask being uncomfortable. Willing to try smaller mask in hospital.  -CPAP as tolerated   #Anemia  Hgb 11.3 down from 13.3 a year ago. Started on Eliquis, denies any bleeding. Normal colonoscopy in 2012.  -Anemia panel pending -F/u as outpatient  This is a Psychologist, occupational Note.  The care of the patient was discussed with Dr. Shirlee Latch and the assessment and plan formulated with their assistance.  Please see their attached note for official documentation of the daily encounter.   LOS: 1 day   Ginger Carne, Med Student 04/21/2014, 11:17 AM

## 2014-04-22 DIAGNOSIS — N179 Acute kidney failure, unspecified: Secondary | ICD-10-CM

## 2014-04-22 DIAGNOSIS — I509 Heart failure, unspecified: Secondary | ICD-10-CM

## 2014-04-22 LAB — APTT
APTT: 69 s — AB (ref 24–37)
aPTT: 53 seconds — ABNORMAL HIGH (ref 24–37)
aPTT: 42 seconds — ABNORMAL HIGH (ref 24–37)

## 2014-04-22 LAB — CBC WITH DIFFERENTIAL/PLATELET
BASOS PCT: 0 % (ref 0–1)
Basophils Absolute: 0 10*3/uL (ref 0.0–0.1)
Eosinophils Absolute: 0.3 10*3/uL (ref 0.0–0.7)
Eosinophils Relative: 3 % (ref 0–5)
HCT: 36 % — ABNORMAL LOW (ref 39.0–52.0)
Hemoglobin: 11.1 g/dL — ABNORMAL LOW (ref 13.0–17.0)
LYMPHS PCT: 25 % (ref 12–46)
Lymphs Abs: 2.7 10*3/uL (ref 0.7–4.0)
MCH: 27.4 pg (ref 26.0–34.0)
MCHC: 30.8 g/dL (ref 30.0–36.0)
MCV: 88.9 fL (ref 78.0–100.0)
MONOS PCT: 12 % (ref 3–12)
Monocytes Absolute: 1.3 10*3/uL — ABNORMAL HIGH (ref 0.1–1.0)
NEUTROS ABS: 6.4 10*3/uL (ref 1.7–7.7)
Neutrophils Relative %: 60 % (ref 43–77)
Platelets: 256 10*3/uL (ref 150–400)
RBC: 4.05 MIL/uL — ABNORMAL LOW (ref 4.22–5.81)
RDW: 13.6 % (ref 11.5–15.5)
WBC: 10.7 10*3/uL — ABNORMAL HIGH (ref 4.0–10.5)

## 2014-04-22 LAB — BASIC METABOLIC PANEL
Anion gap: 14 (ref 5–15)
BUN: 24 mg/dL — AB (ref 6–23)
CHLORIDE: 102 meq/L (ref 96–112)
CO2: 25 meq/L (ref 19–32)
CREATININE: 1.5 mg/dL — AB (ref 0.50–1.35)
Calcium: 9.1 mg/dL (ref 8.4–10.5)
GFR calc Af Amer: 60 mL/min — ABNORMAL LOW (ref >90)
GFR calc non Af Amer: 51 mL/min — ABNORMAL LOW (ref >90)
Glucose, Bld: 105 mg/dL — ABNORMAL HIGH (ref 70–99)
Potassium: 4.4 mEq/L (ref 3.7–5.3)
Sodium: 141 mEq/L (ref 137–147)

## 2014-04-22 LAB — HEPARIN LEVEL (UNFRACTIONATED)
Heparin Unfractionated: 1.82 IU/mL — ABNORMAL HIGH (ref 0.30–0.70)
Heparin Unfractionated: 1.27 IU/mL — ABNORMAL HIGH (ref 0.30–0.70)

## 2014-04-22 LAB — URIC ACID: Uric Acid, Serum: 9.7 mg/dL — ABNORMAL HIGH (ref 4.0–7.8)

## 2014-04-22 LAB — MAGNESIUM: MAGNESIUM: 2.3 mg/dL (ref 1.5–2.5)

## 2014-04-22 MED ORDER — SODIUM CHLORIDE 0.9 % IJ SOLN: 3.0000 mL | Freq: Two times a day (BID) | INTRAMUSCULAR | Status: DC

## 2014-04-22 MED ORDER — COLCHICINE 0.6 MG PO TABS
0.6000 mg | ORAL_TABLET | Freq: Two times a day (BID) | ORAL | Status: DC
  Administered 2014-04-22 – 2014-04-27 (×11): 0.6 mg via ORAL
  Filled 2014-04-22 (×12): qty 1

## 2014-04-22 MED ORDER — ASPIRIN 81 MG PO CHEW
81.0000 mg | CHEWABLE_TABLET | ORAL | Status: AC
  Administered 2014-04-23: 81 mg via ORAL
  Filled 2014-04-22: qty 1

## 2014-04-22 MED ORDER — OXYCODONE HCL 5 MG PO TABS
5.0000 mg | ORAL_TABLET | Freq: Once | ORAL | Status: AC
  Administered 2014-04-22: 5 mg via ORAL
  Filled 2014-04-22: qty 1

## 2014-04-22 MED ORDER — SODIUM CHLORIDE 0.9 % IJ SOLN: 3.0000 mL | INTRAMUSCULAR | Status: DC | PRN

## 2014-04-22 MED ORDER — SODIUM CHLORIDE 0.9 % IV SOLN: 250.0000 mL | INTRAVENOUS | Status: DC | PRN

## 2014-04-22 NOTE — Progress Notes (Signed)
ANTICOAGULATION CONSULT NOTE - Follow Up Consult  Pharmacy Consult for Heparin bridge in anticipation of cath (Apixaban PTA) Indication: chest pain/ACS  Allergies  Allergen Reactions  . Beta Adrenergic Blockers Other (See Comments)    REACTION: A white colored Beta Blocker made him feel funny.    Patient Measurements: Height: 5' 9.5" (176.5 cm) Weight: 328 lb 14.4 oz (149.188 kg) IBW/kg (Calculated) : 71.85 Heparin Dosing Weight: 108 kg  Vital Signs: Temp: 97.5 F (36.4 C) (09/30 0500) Temp src: Oral (09/30 0500) BP: 129/71 mmHg (09/30 0500) Pulse Rate: 77 (09/30 0500)  Labs:  Recent Labs  04/20/14 2138 04/21/14 0249 04/21/14 1751 04/21/14 0840 04/21/14 1415 04/22/14 0539 04/22/14 0610  HGB 11.3*  --   --  11.1*  --   --   --   HCT 36.3*  --   --  35.1*  --   --   --   PLT 229  --   --  239  --   --   --   APTT  --   --   --   --   --  53*  --   HEPARINUNFRC  --   --   --   --   --   --  1.82*  CREATININE 2.06*  --   --  1.70*  --  1.50*  --   TROPONINI  --  <0.30 <0.30  --  <0.30  --   --     Estimated Creatinine Clearance: 82.8 ml/min (by C-G formula based on Cr of 1.5).   Medications:  Heparin 1500 units/hr  Assessment: 53 y/o M on heparin while apixaban on hold in anticipation of cath. HL 1.82 is influenced by apixaban so will dose using aPTT for now which is low at 53, other labs as above.   Goal of Therapy:  Heparin level 0.3-0.7 units/ml aPTT 66-102 seconds Monitor platelets by anticoagulation protocol: Yes   Plan:  -Increase heparin to 1750 units/hr -1400 aPTT/HL -Daily CBC/HL/aPTT -Monitor for bleeding  Abran Duke 04/22/2014,7:14 AM

## 2014-04-22 NOTE — Progress Notes (Signed)
  I have seen and examined the patient, and reviewed the daily progress note by Kerby Nora, MS 4 and discussed the care of the patient with them. Please see my progress note from 04/22/2014 for further details regarding assessment and plan.    Signed:  Gust Rung, DO 04/22/2014, 11:44 AM

## 2014-04-22 NOTE — Progress Notes (Addendum)
Patient ID: Eric Murillo, male   DOB: 06/30/1961, 53 y.o.   MRN: [id number]     SUBJECTIVE: Patient diuresed well yesterday, creatinine down to 1.5.  Main complaint today is right ankle pain.  He denies dyspnea or chest pain at rest.  Scheduled Meds: . allopurinol  100 mg Oral Daily  . [START ON 04/23/2014] aspirin  81 mg Oral Pre-Cath  . aspirin EC  325 mg Oral Daily  . carvedilol  25 mg Oral BID WC  . furosemide  80 mg Intravenous BID  . hydrALAZINE  50 mg Oral TID  . isosorbide mononitrate  60 mg Oral Daily  . potassium chloride SA  20 mEq Oral BID  . simvastatin  40 mg Oral QHS  . sodium chloride  3 mL Intravenous Q12H  . spironolactone  25 mg Oral Daily   Continuous Infusions: . heparin 1,750 Units/hr (04/22/14 0723)   PRN Meds:.sodium chloride, acetaminophen, gi cocktail, HYDROcodone-acetaminophen, ondansetron (ZOFRAN) IV, sodium chloride    Filed Vitals:   04/21/14 2000 04/22/14 0000 04/22/14 0350 04/22/14 0500  BP: 120/56 120/74 129/74 129/71  Pulse: 84 81 81 77  Temp: 98.9 F (37.2 C) 98.6 F (37 C)  97.5 F (36.4 C)  TempSrc: Oral Oral  Oral  Resp: 18 18  18   Height:      Weight:    328 lb 14.4 oz (149.188 kg)  SpO2: 95% 99% 99% 96%    Intake/Output Summary (Last 24 hours) at 04/22/14 0733 Last data filed at 04/22/14 0653  Gross per 24 hour  Intake   1016 ml  Output   2125 ml  Net  -1109 ml    LABS: Basic Metabolic Panel:  Recent Labs  40/98/1109/29/15 0840 04/22/14 0539  NA 144 141  K 4.3 4.4  CL 104 102  CO2 28 25  GLUCOSE 113* 105*  BUN 28* 24*  CREATININE 1.70* 1.50*  CALCIUM 9.1 9.1  MG  --  2.3   Liver Function Tests:  Recent Labs  04/20/14 2138  AST 22  ALT 20  ALKPHOS 77  BILITOT 0.3  PROT 8.1  ALBUMIN 3.8   No results found for this basename: LIPASE, AMYLASE,  in the last 72 hours CBC:  Recent Labs  04/21/14 0840 04/22/14 0539  WBC 12.1* 10.7*  NEUTROABS 8.1* 6.4  HGB 11.1* 11.1*  HCT 35.1* 36.0*  MCV 86.0 88.9  PLT  239 256   Cardiac Enzymes:  Recent Labs  04/21/14 0249 04/21/14 0642 04/21/14 1415  TROPONINI <0.30 <0.30 <0.30   BNP: No components found with this basename: POCBNP,  D-Dimer: No results found for this basename: DDIMER,  in the last 72 hours Hemoglobin A1C: No results found for this basename: HGBA1C,  in the last 72 hours Fasting Lipid Panel: No results found for this basename: CHOL, HDL, LDLCALC, TRIG, CHOLHDL, LDLDIRECT,  in the last 72 hours Thyroid Function Tests: No results found for this basename: TSH, T4TOTAL, FREET3, T3FREE, THYROIDAB,  in the last 72 hours Anemia Panel:  Recent Labs  04/21/14 0303  VITAMINB12 343  FOLATE 12.9  FERRITIN 540*  TIBC 274  IRON 17*  RETICCTPCT 2.0    RADIOLOGY: Dg Chest 2 View  04/20/2014   CLINICAL DATA:  Chest pain, shortness of breath  EXAM: CHEST  2 VIEW  COMPARISON:  Prior radiograph from 03/20/2014  FINDINGS: Cardiac silhouette is moderately enlarged. Mediastinal silhouette within normal limits.  Lungs are mildly hypoinflated with elevation of the right hemidiaphragm, similar to  prior. Mild central perihilar vascular congestion present. No pulmonary edema or pleural effusion. There is no focal infiltrate. No pneumothorax.  No acute osseus abnormality.  IMPRESSION: Stable cardiomegaly with no acute cardiopulmonary abnormality identified.   Electronically Signed   By: Rise Mu M.D.   On: 04/20/2014 22:08    PHYSICAL EXAM General: NAD Neck: JVP 14 cm, no thyromegaly or thyroid nodule.  Lungs: Clear to auscultation bilaterally with normal respiratory effort. CV: Nondisplaced PMI.  Heart regular S1/S2, soft S3, no murmur.  1+ edema to knees bilaterally.   Abdomen: Soft, nontender, no hepatosplenomegaly, no distention.  Neurologic: Alert and oriented x 3.  Psych: Normal affect. Extremities: No clubbing or cyanosis.   TELEMETRY: Reviewed telemetry pt in NSR  ASSESSMENT AND PLAN: 53 yo with history of chronic  systolic CHF (nonischemic cardiomyopathy), CKD, and paroxysmal atrial flutter presented with exertional chest pain and acute on chronic systolic CHF.  1. Chest pain: Exertional chest pain for several days, occurs with minimal exertion and resolves with rest.  Prior cath in 2012 without significant disease.  It is possible that the chest pain is related to volume overload, but the character of the chest pain is concerning.  Given his size, I do not think we would get a very good Cardiolite study on him.  - Continue ASA 81. - Apixaban held with heparin overlap for potential LHC tomorrow.  2. Atrial flutter: Patient remains in NSR after DCCV on 9/11.   3. Acute on chronic systolic CHF: Last EF was 25-30%, global hypokinesis.  He is volume overloaded on exam.  He diuresed reasonably yesterday.  - Continue Lasix 80 mg IV bid. - Continue home Coreg, hydralazine/Imdur, and spironolactone.  No ACEI with elevated creatinine.  - RHC with LHC tomorrow. 4. CKD: Creatinine down to 1.5. Hopefully will improve to remain stable with diuresis as renal venous pressure falls.  5. Right ankle pain: ?gout.  Will check uric acid and give colchicine.   [name] 04/22/2014 7:39 AM

## 2014-04-22 NOTE — Progress Notes (Addendum)
Subjective: Reports he is still having some occasional chest pressure if he moves around.  He notes he is limited in movement due to pain in his right ankle, thinks it is a gout flare.  Objective: Vital signs in last 24 hours: Filed Vitals:   04/22/14 0000 04/22/14 0350 04/22/14 0500 04/22/14 0752  BP: 120/74 129/74 129/71 126/76  Pulse: 81 81 77 81  Temp: 98.6 F (37 C)  97.5 F (36.4 C) 99 F (37.2 C)  TempSrc: Oral  Oral Oral  Resp: 18  18   Height:      Weight:   328 lb 14.4 oz (149.188 kg)   SpO2: 99% 99% 96% 96%   Weight change: -1.6 oz (-0.045 kg)  Intake/Output Summary (Last 24 hours) at 04/22/14 0806 Last data filed at 04/22/14 0653  Gross per 24 hour  Intake   1016 ml  Output   2125 ml  Net  -1109 ml   Vitals reviewed. General: sitting on edge of bed Cardiac: RRR, no murmur Pulm: CTAB Abd: obese, soft, nontender, nondistended, BS present Ext: warm and well perfused, 1+ edema to knees bilaterally, right ankle tender to palpation, no warmth or redness, limited ROM 2/2 pain. Neuro: alert and oriented X3  Lab Results: Basic Metabolic Panel:  Recent Labs Lab 04/21/14 0840 04/22/14 0539  NA 144 141  K 4.3 4.4  CL 104 102  CO2 28 25  GLUCOSE 113* 105*  BUN 28* 24*  CREATININE 1.70* 1.50*  CALCIUM 9.1 9.1  MG  --  2.3   Liver Function Tests:  Recent Labs Lab 04/20/14 2138  AST 22  ALT 20  ALKPHOS 77  BILITOT 0.3  PROT 8.1  ALBUMIN 3.8   CBC:  Recent Labs Lab 04/21/14 0840 04/22/14 0539  WBC 12.1* 10.7*  NEUTROABS 8.1* 6.4  HGB 11.1* 11.1*  HCT 35.1* 36.0*  MCV 86.0 88.9  PLT 239 256   Cardiac Enzymes:  Recent Labs Lab 04/21/14 0249 04/21/14 0642 04/21/14 1415  TROPONINI <0.30 <0.30 <0.30   BNP:  Recent Labs Lab 04/20/14 2138  PROBNP 1869.0*   Anemia Panel:  Recent Labs Lab 04/21/14 0303  VITAMINB12 343  FOLATE 12.9  FERRITIN 540*  TIBC 274  IRON 17*  RETICCTPCT 2.0   Urine Drug Screen: Drugs of Abuse    Component Value Date/Time   LABOPIA NONE DETECTED 04/21/2014 1237    Misc. Labs: Trop x 1, UDS  Micro Results: No results found for this or any previous visit (from the past 240 hour(s)). Studies/Results: Dg Chest 2 View  04/20/2014   CLINICAL DATA:  Chest pain, shortness of breath  EXAM: CHEST  2 VIEW  COMPARISON:  Prior radiograph from 03/20/2014  FINDINGS: Cardiac silhouette is moderately enlarged. Mediastinal silhouette within normal limits.  Lungs are mildly hypoinflated with elevation of the right hemidiaphragm, similar to prior. Mild central perihilar vascular congestion present. No pulmonary edema or pleural effusion. There is no focal infiltrate. No pneumothorax.  No acute osseus abnormality.  IMPRESSION: Stable cardiomegaly with no acute cardiopulmonary abnormality identified.   Electronically Signed   By: Rise MuBenjamin  McClintock M.D.   On: 04/20/2014 22:08   Medications:  Scheduled Meds: . allopurinol  100 mg Oral Daily  . [START ON 04/23/2014] aspirin  81 mg Oral Pre-Cath  . aspirin EC  325 mg Oral Daily  . carvedilol  25 mg Oral BID WC  . colchicine  0.6 mg Oral BID  . furosemide  80 mg Intravenous BID  .  hydrALAZINE  50 mg Oral TID  . isosorbide mononitrate  60 mg Oral Daily  . potassium chloride SA  20 mEq Oral BID  . simvastatin  40 mg Oral QHS  . sodium chloride  3 mL Intravenous Q12H  . spironolactone  25 mg Oral Daily   Continuous Infusions: . heparin 1,750 Units/hr (04/22/14 0723)   PRN Meds:.sodium chloride, acetaminophen, gi cocktail, HYDROcodone-acetaminophen, ondansetron (ZOFRAN) IV, sodium chloride Assessment/Plan: 53 y.o PMH obesity, HLD, nonischemic chronic systolic heart failure, HTN, atrial flutter (s/p TEE DCCV 04/03/14), severe OSA, CKD presented with chest pain with exertion.    #Chest pain on exertion - On ACEi, Statin, BB, and IV heparin -cards to do cath [facility] [place] and RHC on Thursday  #acute on chronic systolic heart failure -net neg -1.1 liter and -1lb  yesterday -continue medical management with home medications (Coreg 25 mg bid, Hydralazine 50 mg tid, Imdur 60, Spironolactone 25 mg qd, no ACEi 2/2 AKI) -Continue IV lasix 80mg  BID  -Strict intake and output, daily weights  - Left and right heart cath in AM  #Atrial flutter -CHADSVASC 2. Currently in NSR -ambulated today and patient was in NSR -continue Coreg  -currently IV heparin for A/C -plan for EP follow up outpatient  #HYPERTENSION -controlled -continue to monitor  #acute on CKD (chronic kidney disease) stage 3 -BL Creatinine 1.4-1.5. Creatinine back down to baseline this AM. -Holding ACEi  #OSA (obstructive sleep apnea) -cpap qhs  #Gout -recent gout flare  -colchicine -continue Allopurinol   #F/E/N -NSL -will trend electrolytes. K repletion with Kdur 20 mg bid  -cardiac diet   #DVT px  - IV heparin   Dispo: Disposition is deferred at this time, awaiting improvement of current medical problems.  Anticipated discharge in approximately 1-2 day(s).   The patient does have a current PCP Luisa Dago, MD) and does not need an [facility] hospital follow-up appointment after discharge.  The patient does not have transportation limitations that hinder transportation to clinic appointments.  .Services Needed at time of discharge: Y = Yes, Blank = No PT:   OT:   RN:   Equipment:   Other:     LOS: 2 days   Gust Rung, DO [phone]  04/22/2014, 8:06 AM

## 2014-04-22 NOTE — Progress Notes (Signed)
  Date: 04/22/2014  Patient name: Eric Murillo  Medical record number: [id number]  Date of birth: 05/27/1961   This patient has been seen and the plan of care was discussed with the house staff. Please see their note for complete details. I concur with their findings with the following additions/corrections:  Patient continues with chest pain with exertion, however, ankle pain appears to be bothering him more today.  Will give a trial of colchicine.  RHC/LHC tomorrow per Cardiology.   Inez Catalina, MD 04/22/2014, 3:19 PM

## 2014-04-22 NOTE — Progress Notes (Signed)
Pt refuses to wear CPAP. RT will monitor. 

## 2014-04-22 NOTE — Progress Notes (Signed)
Pt had 30 beats of Wide QRS/ Vtach. Pt asymptomatic. BP 129/74. Md on call for Internal medicine made aware. Cont to monitor.

## 2014-04-22 NOTE — Progress Notes (Signed)
ANTICOAGULATION CONSULT NOTE - Follow Up Consult  Pharmacy Consult for Heparin Indication: chest pain/ACS  Allergies  Allergen Reactions  . Beta Adrenergic Blockers Other (See Comments)    REACTION: A white colored Beta Blocker made him feel funny.    Patient Measurements: Height: 5' 9.5" (176.5 cm) Weight: 328 lb 14.4 oz (149.188 kg) IBW/kg (Calculated) : 71.85 Heparin Dosing Weight: 108kg  Vital Signs: Temp: 98.9 F (37.2 C) (09/30 1138) Temp src: Oral (09/30 0752) BP: 122/71 mmHg (09/30 1138) Pulse Rate: 85 (09/30 1138)  Labs:  Recent Labs  04/20/14 2138 04/21/14 0249 04/21/14 2595 04/21/14 0840 04/21/14 1415 04/22/14 0539 04/22/14 0610 04/22/14 1450  HGB 11.3*  --   --  11.1*  --  11.1*  --   --   HCT 36.3*  --   --  35.1*  --  36.0*  --   --   PLT 229  --   --  239  --  256  --   --   APTT  --   --   --   --   --  53*  --  42*  HEPARINUNFRC  --   --   --   --   --   --  1.82* 1.27*  CREATININE 2.06*  --   --  1.70*  --  1.50*  --   --   TROPONINI  --  <0.30 <0.30  --  <0.30  --   --   --     Estimated Creatinine Clearance: 82.8 ml/min (by C-G formula based on Cr of 1.5).  Assessment: chest pain  HPI: SOB on exertion. In a fib. DCCV on 09/11 w/ Eliquis. Followed by Dr. Shirlee Latch in clinic. Recently seen in clinic w/ volume overload. Only able to ambulate a few steps. Does not have symptoms at rest. Two pillow orthopnea.   PMH: obesity, HLD, HTN, gout, CKD, cardiomyopathy, A fib  Anticoag: Afib on Eliquis PTA (last dose 9/29). Now transition to heparin with plans for cath 10/1. Enzymes negative. Heparin level 1.27 remains elevated due to lingering Eliquis. APTT 53>>42?   Goal of Therapy:  APTT 66-102 Monitor platelets by anticoagulation protocol: Yes   Plan:  Increase IV heparin to 2050 units/hr Recheck aPTT in 6 hrs.   Kelsie Zaborowski S. Merilynn Finland, PharmD, BCPS Clinical Staff Pharmacist Pager [phone]  Misty Stanley Stillinger 04/22/2014,3:52  PM

## 2014-04-22 NOTE — Progress Notes (Signed)
Subjective: Pt reports chest pain and SOB unchanged.  He reports his leg swelling is unchanged as well. Continues to complain of R ankle pain secondary to presume gout flare. Norco helps reduce the pain.  Objective: Vital signs in last 24 hours: Filed Vitals:   04/22/14 0000 04/22/14 0350 04/22/14 0500 04/22/14 0752  BP: 120/74 129/74 129/71 126/76  Pulse: 81 81 77 81  Temp: 98.6 F (37 C)  97.5 F (36.4 C) 99 F (37.2 C)  TempSrc: Oral  Oral Oral  Resp: 18  18   Height:      Weight:   149.188 kg (328 lb 14.4 oz)   SpO2: 99% 99% 96% 96%   Weight change: -0.045 kg (-1.6 oz)  Intake/Output Summary (Last 24 hours) at 04/22/14 1035 Last data filed at 04/22/14 0943  Gross per 24 hour  Intake    656 ml  Output   3125 ml  Net  -2469 ml   General: resting in bed, NAD HEENT: [place]/at no scleral icterus Cardiac: RRR, no rubs, murmurs or gallops Pulm: clear to auscultation bilaterally, no wheezes, rales, or rhonchi Abd: soft, nontender, nondistended, BS present Ext: warm and well perfused, 1+ pedal edema to knee bilaterally  Neuro: alert and oriented X3, cranial nerves II-XII grossly intact MSK: mild ttp medial mallelolus and talar region. Mild warmth, no significant swelling.  No restriction with range of motion or pain with ROM  Lab Results: Basic Metabolic Panel:  Recent Labs Lab 04/21/14 0840 04/22/14 0539  NA 144 141  K 4.3 4.4  CL 104 102  CO2 28 25  GLUCOSE 113* 105*  BUN 28* 24*  CREATININE 1.70* 1.50*  CALCIUM 9.1 9.1  MG  --  2.3   Liver Function Tests:  Recent Labs Lab 04/20/14 2138  AST 22  ALT 20  ALKPHOS 77  BILITOT 0.3  PROT 8.1  ALBUMIN 3.8   CBC:  Recent Labs Lab 04/21/14 0840 04/22/14 0539  WBC 12.1* 10.7*  NEUTROABS 8.1* 6.4  HGB 11.1* 11.1*  HCT 35.1* 36.0*  MCV 86.0 88.9  PLT 239 256   Cardiac Enzymes:  Recent Labs Lab 04/21/14 0249 04/21/14 0642 04/21/14 1415  TROPONINI <0.30 <0.30 <0.30   BNP:  Recent Labs Lab  04/20/14 2138  PROBNP 1869.0*   Anemia Panel:  Recent Labs Lab 04/21/14 0303  VITAMINB12 343  FOLATE 12.9  FERRITIN 540*  TIBC 274  IRON 17*  RETICCTPCT 2.0   Urine Drug Screen: Drugs of Abuse     Component Value Date/Time   LABOPIA NONE DETECTED 04/21/2014 1237   Studies/Results: Dg Chest 2 View  04/20/2014   CLINICAL DATA:  Chest pain, shortness of breath  EXAM: CHEST  2 VIEW  COMPARISON:  Prior radiograph from 03/20/2014  FINDINGS: Cardiac silhouette is moderately enlarged. Mediastinal silhouette within normal limits.  Lungs are mildly hypoinflated with elevation of the right hemidiaphragm, similar to prior. Mild central perihilar vascular congestion present. No pulmonary edema or pleural effusion. There is no focal infiltrate. No pneumothorax.  No acute osseus abnormality.  IMPRESSION: Stable cardiomegaly with no acute cardiopulmonary abnormality identified.   Electronically Signed   By: Rise Mu M.D.   On: 04/20/2014 22:08   Medications:  Scheduled Meds: . allopurinol  100 mg Oral Daily  . [START ON 04/23/2014] aspirin  81 mg Oral Pre-Cath  . aspirin EC  325 mg Oral Daily  . carvedilol  25 mg Oral BID WC  . colchicine  0.6 mg Oral BID  .  furosemide  80 mg Intravenous BID  . hydrALAZINE  50 mg Oral TID  . isosorbide mononitrate  60 mg Oral Daily  . potassium chloride SA  20 mEq Oral BID  . simvastatin  40 mg Oral QHS  . sodium chloride  3 mL Intravenous Q12H  . spironolactone  25 mg Oral Daily   Continuous Infusions: . heparin 1,750 Units/hr (04/22/14 0723)   PRN Meds:.sodium chloride, acetaminophen, gi cocktail, HYDROcodone-acetaminophen, ondansetron (ZOFRAN) IV, sodium chloride Assessment/Plan:  53 y.o PMH of HLD, nonischemic chronic systolic heart failure (EF 25-30% while in Atrial Flutter), HTN, atrial flutter (s/p TEE DCCV 04/03/14), severe OSA, CKD presented with chest pain with exertion and acute on chronic CHF.  #Chest pain on exertion -typical  and atypical features worrisome for CAD, however acute on systolic CHF also a possible etiology -Troponin neg x3, no evidence of acute ischemic changes on EKG  -cardiology following, plan for cardiac catheterization tomorrow  -continue medical management with home medications (Coreg 25 mg bid, Hydralazine 50 mg tid, Imdur 60, Spironolactone 25 mg qd) -diurese with IV Lasix 80mg  BID instead of Torsemide  -strict i/o, daily weights  #Acute on chronic systolic heart failure -weight is up 3-8 lbs from 01/2014 and 3 lbs from 04/14/14, proBNP elevated from 9/22, CXR with mild central perihilar vascular congestion present -negative 1L yesterday, however weight unchanged  -continue medical management with home medications (Coreg 25 mg bid, Hydralazine 50 mg tid, Imdur 60, Spironolactone 25 mg qd) -Continue Lasix 80 mg bid iv and stopped Torsemide  -Strict intake and output, daily weights  -pending heart failure team to see   #Atrial flutter -CHADSVASC 2. Currently in NSR at rest and with ambulation -telemetry with PVCs, missed beats, asystole, VF/VT since admission. K+ and Mg++ wnl.  -continue Coreg  -d/c Eliquis and placed on Heparin for cardiac catheterization  #HYPERTENSION -controlled on home medications -continue to monitor  #acute on CKD (chronic kidney disease) stage 3 -BL Creatinine 1.4-1.5. Creatinine 1.5 today down from 2.06  -avoiding NSAIDs, ACEI  #normocytic anemia  -anemia panel resulted likely anemia of chronic disease with low Fe, saturation, elevated Ferritin   #OSA (obstructive sleep apnea) -cpap qhs   #Gout -recent gout flare  -initiate colchicine as creatinine is back at baseline -continue prn Norco -continue Allopurinol   #F/E/N -NSL -will trend electrolytes -cardiac diet, NPO after midnight  #DVT px  -Eliquis dc'ed and given IV heparin, scds    Dispo: Disposition is deferred at this time, awaiting improvement of current medical problems.  Anticipated  discharge in approximately 1-2 day(s).   The patient does have a current PCP Luisa Dago(Everett Moding, MD) and [facility] not [facility] after discharge.  The patient does not have transportation limitations that hinder transportation to clinic appointments.  .Services Needed at time of discharge: Y = Yes, Blank = No PT:   OT:   RN:   Equipment:   Other:     LOS: 2 days   Ginger Carneiana M Lister Brizzi, Med Student [phone] 04/22/2014, 10:35 AM

## 2014-04-22 NOTE — Progress Notes (Signed)
ANTICOAGULATION CONSULT NOTE - Follow Up Consult  Pharmacy Consult for Heparin Indication: chest pain/ACS  Allergies  Allergen Reactions  . Beta Adrenergic Blockers Other (See Comments)    REACTION: A white colored Beta Blocker made him feel funny.    Patient Measurements: Height: 5' 9.5" (176.5 cm) Weight: 328 lb 14.4 oz (149.188 kg) IBW/kg (Calculated) : 71.85 Heparin Dosing Weight: 108kg  Vital Signs: Temp: 99.7 F (37.6 C) (09/30 2036) Temp src: Oral (09/30 2036) BP: 114/70 mmHg (09/30 2036) Pulse Rate: 86 (09/30 2036)  Labs:  Recent Labs  04/20/14 2138 04/21/14 0249 04/21/14 5400 04/21/14 0840 04/21/14 1415 04/22/14 0539 04/22/14 0610 04/22/14 1450 04/22/14 2214  HGB 11.3*  --   --  11.1*  --  11.1*  --   --   --   HCT 36.3*  --   --  35.1*  --  36.0*  --   --   --   PLT 229  --   --  239  --  256  --   --   --   APTT  --   --   --   --   --  53*  --  42* 69*  HEPARINUNFRC  --   --   --   --   --   --  1.82* 1.27*  --   CREATININE 2.06*  --   --  1.70*  --  1.50*  --   --   --   TROPONINI  --  <0.30 <0.30  --  <0.30  --   --   --   --     Estimated Creatinine Clearance: 82.8 ml/min (by C-G formula based on Cr of 1.5).  Assessment: chest pain  HPI: SOB on exertion. In a fib. DCCV on 09/11 w/ Eliquis. Followed by Dr. Shirlee Latch in clinic. Recently seen in clinic w/ volume overload. Only able to ambulate a few steps. Does not have symptoms at rest. Two pillow orthopnea.   PMH: obesity, HLD, HTN, gout, CKD, cardiomyopathy, A fib  Anticoag: Afib on Eliquis PTA (last dose 9/29). Now transition to heparin with plans for cath 10/1. Enzymes negative. Heparin level 1.27 remains elevated due to lingering Eliquis. aPTT is therapeutic at 69 on 2050 units/hr of heparin.  Goal of Therapy:  APTT 66-102 Monitor platelets by anticoagulation protocol: Yes   Plan:  Continue heparin at 2050 units/hr Recheck aPTT with AM labs  Arlean Hopping. Newman Pies, PharmD Clinical  Pharmacist Pager [phone] 04/22/2014,10:50 PM

## 2014-04-22 NOTE — Progress Notes (Signed)
Checked back on Pt about wearing CPAP, however he stated he hasn't been able to sleep, so doesn't want to wear CPAP this evening.

## 2014-04-23 ENCOUNTER — Encounter (HOSPITAL_COMMUNITY)
Admission: EM | Disposition: A | Discharge status: Home / Self Care | Payer: Self-pay | Source: Home / Self Care | Attending: Oncology

## 2014-04-23 DIAGNOSIS — I4892 Unspecified atrial flutter: Secondary | ICD-10-CM

## 2014-04-23 DIAGNOSIS — G4733 Obstructive sleep apnea (adult) (pediatric): Secondary | ICD-10-CM

## 2014-04-23 DIAGNOSIS — I5023 Acute on chronic systolic (congestive) heart failure: Secondary | ICD-10-CM

## 2014-04-23 DIAGNOSIS — N183 Chronic kidney disease, stage 3 (moderate): Secondary | ICD-10-CM

## 2014-04-23 DIAGNOSIS — R079 Chest pain, unspecified: Secondary | ICD-10-CM

## 2014-04-23 DIAGNOSIS — M109 Gout, unspecified: Secondary | ICD-10-CM

## 2014-04-23 LAB — CBC
HCT: 35.8 % — ABNORMAL LOW (ref 39.0–52.0)
Hemoglobin: 11.1 g/dL — ABNORMAL LOW (ref 13.0–17.0)
MCH: 27.5 pg (ref 26.0–34.0)
MCHC: 31 g/dL (ref 30.0–36.0)
MCV: 88.6 fL (ref 78.0–100.0)
Platelets: 286 10*3/uL (ref 150–400)
RBC: 4.04 MIL/uL — AB (ref 4.22–5.81)
RDW: 13.4 % (ref 11.5–15.5)
WBC: 13.5 10*3/uL — ABNORMAL HIGH (ref 4.0–10.5)

## 2014-04-23 LAB — BASIC METABOLIC PANEL
ANION GAP: 13 (ref 5–15)
BUN: 20 mg/dL (ref 6–23)
CALCIUM: 9.2 mg/dL (ref 8.4–10.5)
CO2: 27 mEq/L (ref 19–32)
Chloride: 102 mEq/L (ref 96–112)
Creatinine, Ser: 1.61 mg/dL — ABNORMAL HIGH (ref 0.50–1.35)
GFR calc Af Amer: 55 mL/min — ABNORMAL LOW (ref >90)
GFR calc non Af Amer: 47 mL/min — ABNORMAL LOW (ref >90)
Glucose, Bld: 101 mg/dL — ABNORMAL HIGH (ref 70–99)
Potassium: 4.3 mEq/L (ref 3.7–5.3)
SODIUM: 142 meq/L (ref 137–147)

## 2014-04-23 LAB — APTT
APTT: 40 s — AB (ref 24–37)
aPTT: 78 seconds — ABNORMAL HIGH (ref 24–37)

## 2014-04-23 LAB — HEPARIN LEVEL (UNFRACTIONATED)
HEPARIN UNFRACTIONATED: 1.04 [IU]/mL — AB (ref 0.30–0.70)
Heparin Unfractionated: 1 IU/mL — ABNORMAL HIGH (ref 0.30–0.70)

## 2014-04-23 LAB — PROTIME-INR
INR: 1.33 (ref 0.00–1.49)
PROTHROMBIN TIME: 16.5 s — AB (ref 11.6–15.2)

## 2014-04-23 SURGERY — LEFT AND RIGHT HEART CATHETERIZATION WITH CORONARY ANGIOGRAM
Anesthesia: LOCAL

## 2014-04-23 MED ORDER — FUROSEMIDE 10 MG/ML IJ SOLN
80.0000 mg | Freq: Three times a day (TID) | INTRAMUSCULAR | Status: DC
  Administered 2014-04-23 – 2014-04-24 (×3): 80 mg via INTRAVENOUS
  Filled 2014-04-23 (×3): qty 8

## 2014-04-23 NOTE — Progress Notes (Signed)
ANTICOAGULATION CONSULT NOTE - Follow Up Consult  Pharmacy Consult for Heparin Indication: atrial fibrillation  Allergies  Allergen Reactions  . Beta Adrenergic Blockers Other (See Comments)    REACTION: A white colored Beta Blocker made him feel funny.    Patient Measurements: Height: 5' 9.5" (176.5 cm) Weight: 328 lb 14.4 oz (149.188 kg) IBW/kg (Calculated) : 71.85 Heparin Dosing Weight:  108 kg  Vital Signs: Temp: 99.2 F (37.3 C) (10/01 1338) Temp Source: Oral (10/01 1338) BP: 123/71 mmHg (10/01 1338) Pulse Rate: 82 (10/01 1338)  Labs:  Recent Labs  04/21/14 0249 04/21/14 [id number] 04/21/14 0840 04/21/14 1415  04/22/14 0539  04/22/14 1450 04/22/14 2214 04/23/14 0604 04/23/14 1500  HGB  --   --  11.1*  --   --  11.1*  --   --   --  11.1*  --   HCT  --   --  35.1*  --   --  36.0*  --   --   --  35.8*  --   PLT  --   --  239  --   --  256  --   --   --  286  --   APTT  --   --   --   --   < > 53*  --  42* 69* 40* 78*  LABPROT  --   --   --   --   --   --   --   --   --  16.5*  --   INR  --   --   --   --   --   --   --   --   --  1.33  --   HEPARINUNFRC  --   --   --   --   --   --   < > 1.27*  --  1.04* 1.00*  CREATININE  --   --  1.70*  --   --  1.50*  --   --   --  1.61*  --   TROPONINI <0.30 <0.30  --  <0.30  --   --   --   --   --   --   --   < > = values in this interval not displayed.  Estimated Creatinine Clearance: 77.2 ml/min (by C-G formula based on Cr of 1.61).   Assessment: CC: chest pain  HPI: SOB on exertion. In a fib. DCCV on 09/11 w/ Eliquis. Followed by Dr. Shirlee LatchMcLean in clinic. Recently seen in clinic w/ volume overload. Only able to ambulate a few steps. Does not have symptoms at rest. Two pillow orthopnea.   PMH: obesity, HLD, HTN, gout, CKD, cardiomyopathy, A fib  Anticoag: Anticoag: Afib on Eliquis PTA (last dose 9/29). -CHADSVASC 2. Now transition to heparin with plans for cath 10/1. Enzymes negative. Heparin level 1 remains elevated due to  Eliquis. APTT 78 in goal.  ID: WBC up to 13.5  Cards: Cardiomyopathy, HTN, HLD, EF 25-30% - admit w/ CP (trop neg) too obese to do a myoview so going for cath, proBNP 1869, also volume overloaded on exam start IV lasix 80 bid, continue aspirin, coreg, IV Lasix, hydralazine/imdur, simvastatin - will get RHC as well  Endo: Pt c/o gout-like symptoms in R ankle. Allopurinol/colchicine  Renal: CKD3 (baseline sCr 1.7), improving with diuresis down to 1.61, holding ACEi with CKD  Resp: OSA  Anemia: baseline hgb 11.1 down from 13.3 a year ago.  Iron 17,  TIBC 274, Ferritin 540 elevated, Folate 12.9, B12 343  BP: IV heparin   Goal of Therapy:  APTT 66-102 Heparin level 0.3-0.7 units/ml Monitor platelets by anticoagulation protocol: Yes   Plan:  Continue heparin at 2250 units/hr Daily heparin level and CBC  Eric Murillo, PharmD, BCPS Clinical Staff Pharmacist Pager [phone]  Eric Murillo 04/23/2014,3:44 PM

## 2014-04-23 NOTE — Progress Notes (Signed)
ANTICOAGULATION CONSULT NOTE - Follow Up Consult  Pharmacy Consult for Heparin bridge in anticipation of cath (Apixaban PTA) Indication: chest pain/ACS  Allergies  Allergen Reactions  . Beta Adrenergic Blockers Other (See Comments)    REACTION: A white colored Beta Blocker made him feel funny.    Patient Measurements: Height: 5' 9.5" (176.5 cm) Weight: 328 lb 14.4 oz (149.188 kg) IBW/kg (Calculated) : 71.85 Heparin Dosing Weight: 108 kg  Vital Signs: Temp: 99.8 F (37.7 C) (10/01 0500) Temp src: Oral (10/01 0500) BP: 145/78 mmHg (10/01 0500) Pulse Rate: 104 (10/01 0500)  Labs:  Recent Labs  04/21/14 0249 04/21/14 2025 04/21/14 0840 04/21/14 1415  04/22/14 0539 04/22/14 0610 04/22/14 1450 04/22/14 2214 04/23/14 0604  HGB  --   --  11.1*  --   --  11.1*  --   --   --  11.1*  HCT  --   --  35.1*  --   --  36.0*  --   --   --  35.8*  PLT  --   --  239  --   --  256  --   --   --  286  APTT  --   --   --   --   < > 53*  --  42* 69* 40*  LABPROT  --   --   --   --   --   --   --   --   --  16.5*  INR  --   --   --   --   --   --   --   --   --  1.33  HEPARINUNFRC  --   --   --   --   --   --  1.82* 1.27*  --  1.04*  CREATININE  --   --  1.70*  --   --  1.50*  --   --   --  1.61*  TROPONINI <0.30 <0.30  --  <0.30  --   --   --   --   --   --   < > = values in this interval not displayed.  Estimated Creatinine Clearance: 77.2 ml/min (by C-G formula based on Cr of 1.61).    Assessment: 53 y/o M on heparin while apixaban on hold in anticipation of cath. HL 1.04 is influenced by apixaban so will dose using aPTT for now which is low at 40, other labs as above, no issues per RN.   Goal of Therapy:  Heparin level 0.3-0.7 units/ml aPTT 66-102 seconds Monitor platelets by anticoagulation protocol: Yes   Plan:  -Increase heparin to 2250 units/hr -1500 aPTT -Daily CBC/HL/aPTT -Monitor for bleeding  Abran Duke 04/23/2014,7:31 AM

## 2014-04-23 NOTE — Progress Notes (Signed)
Pt refuses CPAP, RT to monitor and assess as needed.  

## 2014-04-23 NOTE — Progress Notes (Signed)
Subjective: Pt denies additional episodes of chest pain.  Continues to report leg swelling, unchanged from yesterday.   Objective: Vital signs in last 24 hours: Filed Vitals:   04/22/14 1138 04/22/14 1637 04/22/14 2036 04/23/14 0500  BP: 122/71 134/70 114/70 145/78  Pulse: 85  86 104  Temp: 98.9 F (37.2 C)  99.7 F (37.6 C) 99.8 F (37.7 C)  TempSrc:   Oral Oral  Resp: 18  18 18   Height:      Weight:      SpO2: 98%  95% 96%   Weight change: weight measurement pending No intake data in the 24 hours  General: resting in bed, NAD HEENT: [place]/at no scleral icterus Cardiac: RRR, no rubs, murmurs or gallops Pulm: clear to auscultation bilaterally, no wheezes, rales, or rhonchi Abd: soft, nontender, nondistended, BS present Ext: warm and well perfused, 1-2+ pedal edema to knee bilaterally  Neuro: alert and oriented X3, cranial nerves II-XII grossly intact MSK: mild ttp medial mallelolus and talar region. Mild warmth, no significant swelling.  No restriction with range of motion or pain with ROM  Lab Results: Basic Metabolic Panel:  Recent Labs Lab 04/22/14 0539 04/23/14 0604  NA 141 142  K 4.4 4.3  CL 102 102  CO2 25 27  GLUCOSE 105* 101*  BUN 24* 20  CREATININE 1.50* 1.61*  CALCIUM 9.1 9.2  MG 2.3  --    Liver Function Tests:  Recent Labs Lab 04/20/14 2138  AST 22  ALT 20  ALKPHOS 77  BILITOT 0.3  PROT 8.1  ALBUMIN 3.8   CBC:  Recent Labs Lab 04/21/14 0840 04/22/14 0539 04/23/14 0604  WBC 12.1* 10.7* 13.5*  NEUTROABS 8.1* 6.4  --   HGB 11.1* 11.1* 11.1*  HCT 35.1* 36.0* 35.8*  MCV 86.0 88.9 88.6  PLT 239 256 286   Cardiac Enzymes:  Recent Labs Lab 04/21/14 0249 04/21/14 0642 04/21/14 1415  TROPONINI <0.30 <0.30 <0.30   BNP:  Recent Labs Lab 04/20/14 2138  PROBNP 1869.0*   Anemia Panel:  Recent Labs Lab 04/21/14 0303  VITAMINB12 343  FOLATE 12.9  FERRITIN 540*  TIBC 274  IRON 17*  RETICCTPCT 2.0   Urine Drug  Screen: Drugs of Abuse     Component Value Date/Time   LABOPIA NONE DETECTED 04/21/2014 1237   Studies/Results: No results found. Medications:  Scheduled Meds: . allopurinol  100 mg Oral Daily  . aspirin EC  325 mg Oral Daily  . carvedilol  25 mg Oral BID WC  . colchicine  0.6 mg Oral BID  . furosemide  80 mg Intravenous Q8H  . hydrALAZINE  50 mg Oral TID  . isosorbide mononitrate  60 mg Oral Daily  . potassium chloride SA  20 mEq Oral BID  . simvastatin  40 mg Oral QHS  . sodium chloride  3 mL Intravenous Q12H  . sodium chloride  3 mL Intravenous Q12H  . spironolactone  25 mg Oral Daily   Continuous Infusions: . heparin 2,250 Units/hr (04/23/14 0823)   PRN Meds:.sodium chloride, sodium chloride, acetaminophen, gi cocktail, HYDROcodone-acetaminophen, ondansetron (ZOFRAN) IV, sodium chloride, sodium chloride Assessment/Plan:  53 y.o PMH of HLD, nonischemic chronic systolic heart failure (EF 25-30% while in Atrial Flutter), HTN, atrial flutter (s/p TEE DCCV 04/03/14), severe OSA, CKD presented with chest pain with exertion and acute on chronic CHF.  #Chest pain on exertion -typical and atypical features worrisome for CAD, however acute on systolic CHF also a possible etiology -Troponin neg x3,  no evidence of acute ischemic changes on EKG  -cardiology following, plan for cardiac catheterization tomorrow  -continue medical management with home medications (Coreg 25 mg bid, Hydralazine 50 mg tid, Imdur 60, Spironolactone 25 mg qd)  #Acute on chronic systolic heart failure -weight is up 3 lbs from 04/14/14, proBNP elevated from 9/22, CXR with mild central perihilar vascular congestion present -negative 1L yesterday, however weight unchanged  -Increase Lasix to 80 mg IV q8hrs -continue medical management with home medications (Coreg 25 mg bid, Hydralazine 50 mg tid, Imdur 60, Spironolactone 25 mg qd) -Strict intake and output, daily weights   #Atrial flutter -CHADSVASC 2. Currently  in NSR at rest and with ambulation -telemetry with PVCs, missed beats, asystole, VF/VT since admission. K+ and Mg++ wnl.  -continue Coreg  -d/c Eliquis and placed on Heparin for cardiac catheterization  #HYPERTENSION -controlled on home medications -continue to monitor  #acute on CKD (chronic kidney disease) stage 3 -BL Creatinine 1.4-1.5. Creatinine stable at 1.6 -avoiding NSAIDs, ACEI  #normocytic anemia  -anemia panel resulted likely anemia of chronic disease with low Fe, saturation, elevated Ferritin   #OSA (obstructive sleep apnea) -cpap qhs   #Gout -?recent gout flare  -contniue colchicine -continue prn Norco -continue Allopurinol   #F/E/N -NSL -will trend electrolytes -cardiac diet, NPO after midnight  #DVT px  -Eliquis dc'ed and given IV heparin, scds    Dispo: Disposition is deferred at this time, awaiting improvement of current medical problems.  Anticipated discharge in approximately 1-2 day(s).   The patient does have a current PCP Luisa Dago(Everett Moding, MD) and does not need an [facility] hospital follow-up appointment after discharge.  The patient does not have transportation limitations that hinder transportation to clinic appointments.  .Services Needed at time of discharge: Y = Yes, Blank = No PT:   OT:   RN:   Equipment:   Other:     LOS: 3 days   Eric Murillo, Med Student [phone] 04/23/2014, 10:44 AM

## 2014-04-23 NOTE — Progress Notes (Signed)
Patient ID: Eric Murillo, male   DOB: July 08, 1961, 53 y.o.   MRN: [id number]     SUBJECTIVE: Creatinine stable at 1.6.  Main complaint today continues to be right ankle pain.  He denies dyspnea or chest pain at rest.  Scheduled Meds: . allopurinol  100 mg Oral Daily  . aspirin EC  325 mg Oral Daily  . carvedilol  25 mg Oral BID WC  . colchicine  0.6 mg Oral BID  . furosemide  80 mg Intravenous Q8H  . hydrALAZINE  50 mg Oral TID  . isosorbide mononitrate  60 mg Oral Daily  . potassium chloride SA  20 mEq Oral BID  . simvastatin  40 mg Oral QHS  . sodium chloride  3 mL Intravenous Q12H  . sodium chloride  3 mL Intravenous Q12H  . spironolactone  25 mg Oral Daily   Continuous Infusions: . heparin 2,050 Units/hr (04/22/14 1650)   PRN Meds:.sodium chloride, sodium chloride, acetaminophen, gi cocktail, HYDROcodone-acetaminophen, ondansetron (ZOFRAN) IV, sodium chloride, sodium chloride    Filed Vitals:   04/22/14 1138 04/22/14 1637 04/22/14 2036 04/23/14 0500  BP: 122/71 134/70 114/70 145/78  Pulse: 85  86 104  Temp: 98.9 F (37.2 C)  99.7 F (37.6 C) 99.8 F (37.7 C)  TempSrc:   Oral Oral  Resp: 18  18 18   Height:      Weight:      SpO2: 98%  95% 96%    Intake/Output Summary (Last 24 hours) at 04/23/14 0729 Last data filed at 04/22/14 0943  Gross per 24 hour  Intake      0 ml  Output   1000 ml  Net  -1000 ml    LABS: Basic Metabolic Panel:  Recent Labs  92/44/62 0539 04/23/14 0604  NA 141 142  K 4.4 4.3  CL 102 102  CO2 25 27  GLUCOSE 105* 101*  BUN 24* 20  CREATININE 1.50* 1.61*  CALCIUM 9.1 9.2  MG 2.3  --    Liver Function Tests:  Recent Labs  04/20/14 2138  AST 22  ALT 20  ALKPHOS 77  BILITOT 0.3  PROT 8.1  ALBUMIN 3.8   No results found for this basename: LIPASE, AMYLASE,  in the last 72 hours CBC:  Recent Labs  04/21/14 0840 04/22/14 0539 04/23/14 0604  WBC 12.1* 10.7* 13.5*  NEUTROABS 8.1* 6.4  --   HGB 11.1* 11.1* 11.1*  HCT  35.1* 36.0* 35.8*  MCV 86.0 88.9 88.6  PLT 239 256 286   Cardiac Enzymes:  Recent Labs  04/21/14 0249 04/21/14 0642 04/21/14 1415  TROPONINI <0.30 <0.30 <0.30   BNP: No components found with this basename: POCBNP,  D-Dimer: No results found for this basename: DDIMER,  in the last 72 hours Hemoglobin A1C: No results found for this basename: HGBA1C,  in the last 72 hours Fasting Lipid Panel: No results found for this basename: CHOL, HDL, LDLCALC, TRIG, CHOLHDL, LDLDIRECT,  in the last 72 hours Thyroid Function Tests: No results found for this basename: TSH, T4TOTAL, FREET3, T3FREE, THYROIDAB,  in the last 72 hours Anemia Panel:  Recent Labs  04/21/14 0303  VITAMINB12 343  FOLATE 12.9  FERRITIN 540*  TIBC 274  IRON 17*  RETICCTPCT 2.0    RADIOLOGY: Dg Chest 2 View  04/20/2014   CLINICAL DATA:  Chest pain, shortness of breath  EXAM: CHEST  2 VIEW  COMPARISON:  Prior radiograph from 03/20/2014  FINDINGS: Cardiac silhouette is moderately enlarged. Mediastinal silhouette  within normal limits.  Lungs are mildly hypoinflated with elevation of the right hemidiaphragm, similar to prior. Mild central perihilar vascular congestion present. No pulmonary edema or pleural effusion. There is no focal infiltrate. No pneumothorax.  No acute osseus abnormality.  IMPRESSION: Stable cardiomegaly with no acute cardiopulmonary abnormality identified.   Electronically Signed   By: Rise MuBenjamin  McClintock M.D.   On: 04/20/2014 22:08    PHYSICAL EXAM General: NAD Neck: JVP 12 cm, no thyromegaly or thyroid nodule.  Lungs: Clear to auscultation bilaterally with normal respiratory effort. CV: Nondisplaced PMI.  Heart regular S1/S2, soft S3, no murmur.  1+ edema to knee on right, 1+ ankle edema on left.   Abdomen: Soft, nontender, no hepatosplenomegaly, no distention.  Neurologic: Alert and oriented x 3.  Psych: Normal affect. Extremities: No clubbing or cyanosis.   TELEMETRY: Reviewed telemetry pt  in NSR  ASSESSMENT AND PLAN: 53 yo with history of chronic systolic CHF (nonischemic cardiomyopathy), CKD, and paroxysmal atrial flutter presented with exertional chest pain and acute on chronic systolic CHF.  1. Chest pain: Exertional chest pain for several days prior to admission, occurs with minimal exertion and resolves with rest.  Prior cath in 2012 without significant disease.  It is possible that the chest pain is related to volume overload, but the character of the chest pain is concerning.  Given his size, I do not think we would get a very good Cardiolite study on him. Troponin negative, no further chest pain in hospital.  - Continue ASA 81. - Apixaban held with heparin overlap for potential LHC/RHC.  Given no further chest pain and ongoing volume overload, will diurese another day before LHC/RHC potentially tomorrow.   2. Atrial flutter: Patient remains in NSR after DCCV on 9/11.   3. Acute on chronic systolic CHF: Last EF was 25-30%, global hypokinesis.  He is volume overloaded on exam.  He diuresed reasonably yesterday.  - Would like to see more weight off.  Increase Lasix to 80 mg IV every 8 hrs. - Continue home Coreg, hydralazine/Imdur, and spironolactone.  No ACEI with elevated creatinine.  - RHC with LHC potentially tomorrow. 4. CKD: Creatinine stable at 1.6. Hopefully will improve to remain stable with diuresis as renal venous pressure falls.  5. Right ankle pain: ?gout.  High uric acid, on colchicine.   Marca AnconaDalton Niveah Boerner 04/23/2014 7:29 AM

## 2014-04-23 NOTE — Progress Notes (Signed)
  I have seen and examined the patient, and reviewed the daily progress note by Kerby Nora, MS 4 and discussed the care of the patient with them. Please see my progress note from 04/23/2014 for further details regarding assessment and plan.    Signed:  Gust Rung, DO 04/23/2014, 11:52 AM

## 2014-04-23 NOTE — Progress Notes (Signed)
Subjective: No further chest pain, right ankle still bothering. Seen by cardiology this AM, want further diuresis before cath. Objective: Vital signs in last 24 hours: Filed Vitals:   04/22/14 1138 04/22/14 1637 04/22/14 2036 04/23/14 0500  BP: 122/71 134/70 114/70 145/78  Pulse: 85  86 104  Temp: 98.9 F (37.2 C)  99.7 F (37.6 C) 99.8 F (37.7 C)  TempSrc:   Oral Oral  Resp: 18  18 18   Height:      Weight:      SpO2: 98%  95% 96%   Weight change:   Intake/Output Summary (Last 24 hours) at 04/23/14 1152 Last data filed at 04/23/14 1103  Gross per 24 hour  Intake      0 ml  Output    375 ml  Net   -375 ml   Vitals reviewed. General: sitting on edge of bed Cardiac: RRR, no murmur Pulm: CTAB Abd: obese, soft, nontender, nondistended, BS present Ext: warm and well perfused, 1-2+ edema to knees bilaterally, right ankle tender to palpation Neuro: alert and oriented X3  Lab Results: Basic Metabolic Panel:  Recent Labs Lab 04/22/14 0539 04/23/14 0604  NA 141 142  K 4.4 4.3  CL 102 102  CO2 25 27  GLUCOSE 105* 101*  BUN 24* 20  CREATININE 1.50* 1.61*  CALCIUM 9.1 9.2  MG 2.3  --    Liver Function Tests:  Recent Labs Lab 04/20/14 2138  AST 22  ALT 20  ALKPHOS 77  BILITOT 0.3  PROT 8.1  ALBUMIN 3.8   CBC:  Recent Labs Lab 04/21/14 0840 04/22/14 0539 04/23/14 0604  WBC 12.1* 10.7* 13.5*  NEUTROABS 8.1* 6.4  --   HGB 11.1* 11.1* 11.1*  HCT 35.1* 36.0* 35.8*  MCV 86.0 88.9 88.6  PLT 239 256 286   Cardiac Enzymes:  Recent Labs Lab 04/21/14 0249 04/21/14 0642 04/21/14 1415  TROPONINI <0.30 <0.30 <0.30   BNP:  Recent Labs Lab 04/20/14 2138  PROBNP 1869.0*   Anemia Panel:  Recent Labs Lab 04/21/14 0303  VITAMINB12 343  FOLATE 12.9  FERRITIN 540*  TIBC 274  IRON 17*  RETICCTPCT 2.0   Urine Drug Screen: Drugs of Abuse     Component Value Date/Time   LABOPIA NONE DETECTED 04/21/2014 1237    Misc. Labs: Trop x 1,  UDS  Micro Results: No results found for this or any previous visit (from the past 240 hour(s)). Studies/Results: No results found. Medications:  Scheduled Meds: . allopurinol  100 mg Oral Daily  . aspirin EC  325 mg Oral Daily  . carvedilol  25 mg Oral BID WC  . colchicine  0.6 mg Oral BID  . furosemide  80 mg Intravenous Q8H  . hydrALAZINE  50 mg Oral TID  . isosorbide mononitrate  60 mg Oral Daily  . potassium chloride SA  20 mEq Oral BID  . simvastatin  40 mg Oral QHS  . sodium chloride  3 mL Intravenous Q12H  . sodium chloride  3 mL Intravenous Q12H  . spironolactone  25 mg Oral Daily   Continuous Infusions: . heparin 2,250 Units/hr (04/23/14 0823)   PRN Meds:.sodium chloride, sodium chloride, acetaminophen, gi cocktail, HYDROcodone-acetaminophen, ondansetron (ZOFRAN) IV, sodium chloride, sodium chloride Assessment/Plan: 53 y.o PMH obesity, HLD, nonischemic chronic systolic heart failure, HTN, atrial flutter (s/p TEE DCCV 04/03/14), severe OSA, CKD presented with chest pain with exertion.    #Chest pain on exertion - On ACEi, Statin, BB, and IV heparin -  cards tentatively to do cath [facility] and RHC on 10/2  #acute on chronic systolic heart failure -net neg -4.3 liter (although poor documented intake) and n oweights documented since admission -continue medical management with home medications (Coreg 25 mg bid, Hydralazine 50 mg tid, Imdur 60, Spironolactone 25 mg qd, no ACEi 2/2 AKI) -IV lasix 80mg  TID -Strict intake and output, daily weights  - Left and right heart cath in AM  #Atrial flutter -CHADSVASC 2. Currently in NSR -ambulated today and patient was in NSR -continue Coreg  -currently IV heparin for A/C -plan for EP follow up outpatient  #HYPERTENSION -controlled -continue to monitor  #acute on CKD (chronic kidney disease) stage 3 -BL Creatinine 1.4-1.5. Creatinine back down to baseline. -Holding ACEi  #OSA (obstructive sleep apnea) -cpap  qhs  #Gout -recent gout flare  -colchicine -continue Allopurinol   #F/E/N -NSL -will trend electrolytes. K repletion with Kdur 20 mg bid  -cardiac diet   #DVT px  - IV heparin   Dispo: Disposition is deferred at this time, awaiting improvement of current medical problems.  Anticipated discharge in approximately 1-2 day(s).   The patient does have a current PCP Luisa Dago, MD) and does not need an [facility] hospital follow-up appointment after discharge.  The patient does not have transportation limitations that hinder transportation to clinic appointments.  .Services Needed at time of discharge: Y = Yes, Blank = No PT:   OT:   RN:   Equipment:   Other:     LOS: 3 days   Gust Rung, DO Pagers: [phone]  04/23/2014, 11:52 AM

## 2014-04-23 NOTE — Progress Notes (Signed)
Patient ID: Taurino Figeroa, male   DOB: 1960/09/30, 53 y.o.   MRN: [id number] Medicine attending progress note: I personally interviewed and examined this patient today together with resident physician Dr. Harmon Dun and 4th  year medical student Ms. Kerby Nora and I concur with their assessment and management plan. 53 year old man with morbid obesity, hypertension, nonischemic cardiomyopathy with estimated ejection fraction 25-30%,  chronic paroxysmal atrial flutter on chronic anticoagulation. He underwent cardioversion on September 11 for symptomatic atrial flutter. He was readmitted to the hospital with exertional chest pain and dyspnea on September 28. He has ruled out for an acute MI. He was found to be in heart failure but still in sinus rhythm. He is receiving parenteral diuresis. Clinically he has improved. A cardiac catheterization is planned for tomorrow.

## 2014-04-24 ENCOUNTER — Encounter (HOSPITAL_COMMUNITY)
Admission: EM | Disposition: A | Discharge status: Home / Self Care | Payer: Self-pay | Source: Home / Self Care | Attending: Oncology

## 2014-04-24 DIAGNOSIS — I10 Essential (primary) hypertension: Secondary | ICD-10-CM

## 2014-04-24 DIAGNOSIS — I509 Heart failure, unspecified: Secondary | ICD-10-CM

## 2014-04-24 DIAGNOSIS — M10071 Idiopathic gout, right ankle and foot: Secondary | ICD-10-CM

## 2014-04-24 DIAGNOSIS — N189 Chronic kidney disease, unspecified: Secondary | ICD-10-CM

## 2014-04-24 DIAGNOSIS — I5043 Acute on chronic combined systolic (congestive) and diastolic (congestive) heart failure: Principal | ICD-10-CM

## 2014-04-24 DIAGNOSIS — R079 Chest pain, unspecified: Secondary | ICD-10-CM

## 2014-04-24 HISTORY — PX: LEFT AND RIGHT HEART CATHETERIZATION WITH CORONARY ANGIOGRAM: SHX5449

## 2014-04-24 LAB — BASIC METABOLIC PANEL
Anion gap: 14 (ref 5–15)
BUN: 20 mg/dL (ref 6–23)
CO2: 27 mEq/L (ref 19–32)
CREATININE: 1.68 mg/dL — AB (ref 0.50–1.35)
Calcium: 9.3 mg/dL (ref 8.4–10.5)
Chloride: 101 mEq/L (ref 96–112)
GFR calc Af Amer: 52 mL/min — ABNORMAL LOW (ref >90)
GFR, EST NON AFRICAN AMERICAN: 45 mL/min — AB (ref >90)
GLUCOSE: 95 mg/dL (ref 70–99)
Potassium: 4.3 mEq/L (ref 3.7–5.3)
Sodium: 142 mEq/L (ref 137–147)

## 2014-04-24 LAB — CBC
HEMATOCRIT: 35.3 % — AB (ref 39.0–52.0)
Hemoglobin: 11 g/dL — ABNORMAL LOW (ref 13.0–17.0)
MCH: 27.4 pg (ref 26.0–34.0)
MCHC: 31.2 g/dL (ref 30.0–36.0)
MCV: 88 fL (ref 78.0–100.0)
Platelets: 289 10*3/uL (ref 150–400)
RBC: 4.01 MIL/uL — ABNORMAL LOW (ref 4.22–5.81)
RDW: 13.4 % (ref 11.5–15.5)
WBC: 13.4 10*3/uL — ABNORMAL HIGH (ref 4.0–10.5)

## 2014-04-24 LAB — POCT I-STAT 3, VENOUS BLOOD GAS (G3P V)
ACID-BASE EXCESS: 3 mmol/L — AB (ref 0.0–2.0)
Bicarbonate: 28.1 mEq/L — ABNORMAL HIGH (ref 20.0–24.0)
O2 Saturation: 61 %
PH VEN: 7.396 — AB (ref 7.250–7.300)
TCO2: 30 mmol/L (ref 0–100)
pCO2, Ven: 45.9 mmHg (ref 45.0–50.0)
pO2, Ven: 32 mmHg (ref 30.0–45.0)

## 2014-04-24 LAB — APTT: aPTT: 51 seconds — ABNORMAL HIGH (ref 24–37)

## 2014-04-24 LAB — HEPARIN LEVEL (UNFRACTIONATED): Heparin Unfractionated: 0.71 IU/mL — ABNORMAL HIGH (ref 0.30–0.70)

## 2014-04-24 SURGERY — LEFT AND RIGHT HEART CATHETERIZATION WITH CORONARY ANGIOGRAM
Anesthesia: LOCAL

## 2014-04-24 MED ORDER — APIXABAN 5 MG PO TABS
5.0000 mg | ORAL_TABLET | Freq: Two times a day (BID) | ORAL | Status: DC
  Administered 2014-04-24 – 2014-04-27 (×6): 5 mg via ORAL
  Filled 2014-04-24 (×4): qty 1

## 2014-04-24 MED ORDER — SODIUM CHLORIDE 0.9 % IJ SOLN
3.0000 mL | Freq: Two times a day (BID) | INTRAMUSCULAR | Status: DC
  Administered 2014-04-25 – 2014-04-27 (×5): 3 mL via INTRAVENOUS

## 2014-04-24 MED ORDER — MIDAZOLAM HCL 2 MG/2ML IJ SOLN
INTRAMUSCULAR | Status: AC
  Filled 2014-04-24: qty 2

## 2014-04-24 MED ORDER — FUROSEMIDE 10 MG/ML IJ SOLN
80.0000 mg | Freq: Two times a day (BID) | INTRAMUSCULAR | Status: DC
  Administered 2014-04-24 – 2014-04-27 (×6): 80 mg via INTRAVENOUS
  Filled 2014-04-24 (×6): qty 8

## 2014-04-24 MED ORDER — ASPIRIN 81 MG PO CHEW: 81.0000 mg | CHEWABLE_TABLET | ORAL | Status: DC

## 2014-04-24 MED ORDER — SODIUM CHLORIDE 0.9 % IJ SOLN: 3.0000 mL | Freq: Two times a day (BID) | INTRAMUSCULAR | Status: DC

## 2014-04-24 MED ORDER — FENTANYL CITRATE 0.05 MG/ML IJ SOLN
INTRAMUSCULAR | Status: AC
  Filled 2014-04-24: qty 2

## 2014-04-24 MED ORDER — LIDOCAINE HCL (PF) 1 % IJ SOLN
INTRAMUSCULAR | Status: AC
  Filled 2014-04-24: qty 30

## 2014-04-24 MED ORDER — ACETAMINOPHEN 325 MG PO TABS: 650.0000 mg | ORAL_TABLET | ORAL | Status: DC | PRN

## 2014-04-24 MED ORDER — SODIUM CHLORIDE 0.9 % IJ SOLN
3.0000 mL | INTRAMUSCULAR | Status: DC | PRN
Start: 2014-04-24 — End: 2014-04-27

## 2014-04-24 MED ORDER — SODIUM CHLORIDE 0.9 % IV SOLN: 250.0000 mL | INTRAVENOUS | Status: DC | PRN

## 2014-04-24 MED ORDER — VERAPAMIL HCL 2.5 MG/ML IV SOLN
INTRAVENOUS | Status: AC
  Filled 2014-04-24: qty 2

## 2014-04-24 MED ORDER — HEPARIN (PORCINE) IN NACL 2-0.9 UNIT/ML-% IJ SOLN
INTRAMUSCULAR | Status: AC
  Filled 2014-04-24: qty 1500

## 2014-04-24 MED ORDER — ONDANSETRON HCL 4 MG/2ML IJ SOLN
4.0000 mg | Freq: Four times a day (QID) | INTRAMUSCULAR | Status: DC | PRN
Start: 2014-04-24 — End: 2014-04-24

## 2014-04-24 MED ORDER — SODIUM CHLORIDE 0.9 % IJ SOLN: 3.0000 mL | INTRAMUSCULAR | Status: DC | PRN

## 2014-04-24 MED ORDER — NITROGLYCERIN 1 MG/10 ML FOR IR/CATH LAB
INTRA_ARTERIAL | Status: AC
  Filled 2014-04-24: qty 10

## 2014-04-24 NOTE — Progress Notes (Signed)
  I have seen and examined the patient, and reviewed the daily progress note by Kerby Nora, MS 4 and discussed the care of the patient with them. Please see my progress note from 04/24/2014 for further details regarding assessment and plan.    Signed:  Gust Rung, DO 04/24/2014, 5:15 PM

## 2014-04-24 NOTE — Progress Notes (Signed)
Utilization review completed.  

## 2014-04-24 NOTE — Progress Notes (Signed)
Medicine attending: I personally interviewed and examined this patient today along with resident physician Dr. Harmon Dun and fourth year medical student Ms. Kerby Nora and I concur with his evaluation and management plan. He remains in sinus rhythm following initial cardioversion on September 11. He was readmitted to the hospital with exertional chest pain and acute on chronic congestive heart failure. He responded nicely to parenteral diuretics. He is scheduled for a cardiac catheterization today. Further management based on catheterization results and cardiology recommendations.  Eric Darby, MD, FACP  Hematology-Oncology/Internal Medicine

## 2014-04-24 NOTE — Progress Notes (Signed)
Subjective: No further chest pain, right ankle still hurting but slightly improved on colcrys, no trauma to area. Cardiology plans for right and left heart cath today. Objective: Vital signs in last 24 hours: Filed Vitals:   04/23/14 0500 04/23/14 1338 04/23/14 2053 04/24/14 0659  BP: 145/78 123/71 117/61 116/77  Pulse: 104 82 89 83  Temp: 99.8 F (37.7 C) 99.2 F (37.3 C) 99.2 F (37.3 C) 99.5 F (37.5 C)  TempSrc: Oral Oral Oral Oral  Resp: 18 18 18 18   Height:      Weight:   321 lb (145.605 kg) 316 lb (143.337 kg)  SpO2: 96% 97% 98% 95%   Weight change:   Intake/Output Summary (Last 24 hours) at 04/24/14 1142 Last data filed at 04/24/14 0405  Gross per 24 hour  Intake    240 ml  Output   3675 ml  Net  -3435 ml   Vitals reviewed. General: sitting on edge of bed Cardiac: RRR, no murmur Pulm: CTAB Abd: obese, soft, nontender, nondistended, BS present Ext: warm and well perfused, 1+ edema to knees bilaterally, right ankle tender to palpation, painful PROM Neuro: alert and oriented X3  Lab Results: Basic Metabolic Panel:  Recent Labs Lab 04/22/14 0539 04/23/14 0604 04/24/14 0500  NA 141 142 142  K 4.4 4.3 4.3  CL 102 102 101  CO2 25 27 27   GLUCOSE 105* 101* 95  BUN 24* 20 20  CREATININE 1.50* 1.61* 1.68*  CALCIUM 9.1 9.2 9.3  MG 2.3  --   --    Liver Function Tests:  Recent Labs Lab 04/20/14 2138  AST 22  ALT 20  ALKPHOS 77  BILITOT 0.3  PROT 8.1  ALBUMIN 3.8   CBC:  Recent Labs Lab 04/21/14 0840 04/22/14 0539 04/23/14 0604 04/24/14 0500  WBC 12.1* 10.7* 13.5* 13.4*  NEUTROABS 8.1* 6.4  --   --   HGB 11.1* 11.1* 11.1* 11.0*  HCT 35.1* 36.0* 35.8* 35.3*  MCV 86.0 88.9 88.6 88.0  PLT 239 256 286 289   Cardiac Enzymes:  Recent Labs Lab 04/21/14 0249 04/21/14 0642 04/21/14 1415  TROPONINI <0.30 <0.30 <0.30   BNP:  Recent Labs Lab 04/20/14 2138  PROBNP 1869.0*   Anemia Panel:  Recent Labs Lab 04/21/14 0303  VITAMINB12  343  FOLATE 12.9  FERRITIN 540*  TIBC 274  IRON 17*  RETICCTPCT 2.0   Urine Drug Screen: Drugs of Abuse     Component Value Date/Time   LABOPIA NONE DETECTED 04/21/2014 1237    Misc. Labs: Trop x 1, UDS  Micro Results: No results found for this or any previous visit (from the past 240 hour(s)). Studies/Results: No results found. Medications:  Scheduled Meds: . [facility] HOLD] allopurinol  100 mg Oral Daily  . [START ON 04/25/2014] aspirin  81 mg Oral Pre-Cath  . [facility] HOLD] aspirin EC  325 mg Oral Daily  . [facility] HOLD] carvedilol  25 mg Oral BID WC  . [facility] HOLD] colchicine  0.6 mg Oral BID  . [[facility] HOLD] hydrALAZINE  50 mg Oral TID  . [facility] HOLD] isosorbide mononitrate  60 mg Oral Daily  . [facility] HOLD] potassium chloride SA  20 mEq Oral BID  . [facility] HOLD] simvastatin  40 mg Oral QHS  . sodium chloride  3 mL Intravenous Q12H  . sodium chloride  3 mL Intravenous Q12H  . sodium chloride  3 mL Intravenous Q12H  . [facility] HOLD] spironolactone  25 mg Oral Daily  Continuous Infusions: . heparin 2,250 Units/hr (04/23/14 1443)   PRN Meds:.sodium chloride, sodium chloride, sodium chloride, [[facility] HOLD] acetaminophen, [[facility] HOLD] gi cocktail, [[facility] HOLD] HYDROcodone-acetaminophen, [[facility] HOLD] ondansetron (ZOFRAN) IV, sodium chloride, sodium chloride, sodium chloride Assessment/Plan: 53 y.o PMH obesity, HLD, nonischemic chronic systolic heart failure, HTN, atrial flutter (s/p TEE DCCV 04/03/14), severe OSA, CKD presented with chest pain with exertion.    #Chest pain on exertion - On ACEi, Statin, BB, and IV heparin -[facility]  #acute on chronic systolic heart failure -continue medical management with home medications (Coreg 25 mg bid, Hydralazine 50 mg tid, Imdur 60, Spironolactone 25 mg qd, no ACEi 2/2 AKI) -diuretics held per cards, to adjust based on RHC results -Strict intake and output, daily weights    #Atrial flutter -CHADSVASC 2. Currently in NSR -continue Coreg    -currently IV heparin for A/C -plan for EP follow up outpatient  #HYPERTENSION -controlled -continue to monitor  #acute on CKD (chronic kidney disease) stage 3 -BL Creatinine 1.4-1.5. Creatinine back down to baseline. -Holding ACEi  #OSA (obstructive sleep apnea) -cpap qhs  #Gout -recent gout flare  -colchicine -continue Allopurinol   #F/E/N -NSL -will trend electrolytes -cardiac diet   #DVT px  - IV heparin   Dispo: Disposition is deferred at this time, awaiting improvement of current medical problems.  Anticipated discharge in approximately 1-2 day(s).   The patient does have a current PCP Luisa Dago(Everett Moding, MD) and [facility] after [facility].  The patient does not have transportation limitations that hinder transportation to clinic appointments.  .Services Needed at time of discharge: Y = Yes, Blank = No PT:   OT:   RN:   Equipment:   Other:     LOS: 4 days   Gust RungErik C Aspin Palomarez, DO Pagers: [phone]  04/24/2014, 11:42 AM

## 2014-04-24 NOTE — Interval H&P Note (Signed)
Cath Lab Visit (complete for each Cath Lab visit)  Clinical Evaluation Leading to the Procedure:   ACS: No.  Non-ACS:    Anginal Classification: CCS III  Anti-ischemic medical therapy: Minimal Therapy (1 class of medications)  Non-Invasive Test Results: No non-invasive testing performed  Prior CABG: No previous CABG      History and Physical Interval Note:  04/24/2014 11:42 AM  Eric Murillo  has presented today for surgery, with the diagnosis of cp  The various methods of treatment have been discussed with the patient and family. After consideration of risks, benefits and other options for treatment, the patient has consented to  Procedure(s): LEFT AND RIGHT HEART CATHETERIZATION WITH CORONARY ANGIOGRAM (N/A) as a surgical intervention .  The patient's history has been reviewed, patient examined, no change in status, stable for surgery.  I have reviewed the patient's chart and labs.  Questions were answered to the patient's satisfaction.     [employer]

## 2014-04-24 NOTE — Progress Notes (Signed)
Patient ID: Eric Murillo, male   DOB: 01/13/1961, 53 y.o.   MRN: [id number]     SUBJECTIVE: Weight down 16 lbs.  Main complaint today continues to be right ankle pain.  He denies dyspnea or chest pain at rest.  Scheduled Meds: . allopurinol  100 mg Oral Daily  . aspirin EC  325 mg Oral Daily  . carvedilol  25 mg Oral BID WC  . colchicine  0.6 mg Oral BID  . hydrALAZINE  50 mg Oral TID  . isosorbide mononitrate  60 mg Oral Daily  . potassium chloride SA  20 mEq Oral BID  . simvastatin  40 mg Oral QHS  . sodium chloride  3 mL Intravenous Q12H  . sodium chloride  3 mL Intravenous Q12H  . spironolactone  25 mg Oral Daily   Continuous Infusions: . heparin 2,250 Units/hr (04/23/14 1443)   PRN Meds:.sodium chloride, sodium chloride, acetaminophen, gi cocktail, HYDROcodone-acetaminophen, ondansetron (ZOFRAN) IV, sodium chloride, sodium chloride    Filed Vitals:   04/23/14 0500 04/23/14 1338 04/23/14 2053 04/24/14 0659  BP: 145/78 123/71 117/61 116/77  Pulse: 104 82 89 83  Temp: 99.8 F (37.7 C) 99.2 F (37.3 C) 99.2 F (37.3 C) 99.5 F (37.5 C)  TempSrc: Oral Oral Oral Oral  Resp: 18 18 18 18  Height:      Weight:   321 lb (145.605 kg) 316 lb (143.337 kg)  SpO2: 96% 97% 98% 95%    Intake/Output Summary (Last 24 hours) at 04/24/14 0723 Last data filed at 04/24/14 0405  Gross per 24 hour  Intake    480 ml  Output   4050 ml  Net  -3570 ml    LABS: Basic Metabolic Panel:  Recent Labs  04/22/14 0539 04/23/14 0604 04/24/14 0500  NA 141 142 142  K 4.4 4.3 4.3  CL 102 102 101  CO2 25 27 27  GLUCOSE 105* 101* 95  BUN 24* 20 20  CREATININE 1.50* 1.61* 1.68*  CALCIUM 9.1 9.2 9.3  MG 2.3  --   --    Liver Function Tests: No results found for this basename: AST, ALT, ALKPHOS, BILITOT, PROT, ALBUMIN,  in the last 72 hours No results found for this basename: LIPASE, AMYLASE,  in the last 72 hours CBC:  Recent Labs  04/21/14 0840 04/22/14 0539 04/23/14 0604  04/24/14 0500  WBC 12.1* 10.7* 13.5* 13.4*  NEUTROABS 8.1* 6.4  --   --   HGB 11.1* 11.1* 11.1* 11.0*  HCT 35.1* 36.0* 35.8* 35.3*  MCV 86.0 88.9 88.6 88.0  PLT 239 256 286 289   Cardiac Enzymes:  Recent Labs  04/21/14 1415  TROPONINI <0.30   BNP: No components found with this basename: POCBNP,  D-Dimer: No results found for this basename: DDIMER,  in the last 72 hours Hemoglobin A1C: No results found for this basename: HGBA1C,  in the last 72 hours Fasting Lipid Panel: No results found for this basename: CHOL, HDL, LDLCALC, TRIG, CHOLHDL, LDLDIRECT,  in the last 72 hours Thyroid Function Tests: No results found for this basename: TSH, T4TOTAL, FREET3, T3FREE, THYROIDAB,  in the last 72 hours Anemia Panel: No results found for this basename: VITAMINB12, FOLATE, FERRITIN, TIBC, IRON, RETICCTPCT,  in the last 72 hours  RADIOLOGY: Dg Chest 2 View  04/20/2014   CLINICAL DATA:  Chest pain, shortness of breath  EXAM: CHEST  2 VIEW  COMPARISON:  Prior radiograph from 03/20/2014  FINDINGS: Cardiac silhouette is moderately enlarged. Mediastinal silhouette   within normal limits.  Lungs are mildly hypoinflated with elevation of the right hemidiaphragm, similar to prior. Mild central perihilar vascular congestion present. No pulmonary edema or pleural effusion. There is no focal infiltrate. No pneumothorax.  No acute osseus abnormality.  IMPRESSION: Stable cardiomegaly with no acute cardiopulmonary abnormality identified.   Electronically Signed   By: Rise Mu M.D.   On: 04/20/2014 22:08    PHYSICAL EXAM General: NAD Neck: JVP 9-10 cm, no thyromegaly or thyroid nodule.  Lungs: Clear to auscultation bilaterally with normal respiratory effort. CV: Nondisplaced PMI.  Heart regular S1/S2, soft S3, no murmur.  1+ ankle edema bilaterally. Abdomen: Soft, nontender, no hepatosplenomegaly, no distention.  Neurologic: Alert and oriented x 3.  Psych: Normal affect. Extremities: No  clubbing or cyanosis.   TELEMETRY: Reviewed telemetry pt in NSR  ASSESSMENT AND PLAN: 53 yo with history of chronic systolic CHF (nonischemic cardiomyopathy), CKD, and paroxysmal atrial flutter presented with exertional chest pain and acute on chronic systolic CHF.  1. Chest pain: Exertional chest pain for several days prior to admission, occurs with minimal exertion and resolves with rest.  Prior cath in 2012 without significant disease.  It is possible that the chest pain is related to volume overload, but the character of the chest pain is concerning.  Given his size, I do not think we would get a very good Cardiolite study on him. Troponin negative, no further chest pain in hospital.  - Continue ASA 81. - Apixaban held with heparin overlap for potential LHC/RHC.  Plan for cath today.   2. Atrial flutter: Patient remains in NSR after DCCV on 9/11.   3. Acute on chronic systolic CHF: Last EF was 25-30%, global hypokinesis.  Admitted with significant volume overload.  He diuresed well yesterday, will do RHC today.  - Hold Lasix prior to cath this morning.  Will decide on diuretic regimen based on RHC results. - Continue home Coreg, hydralazine/Imdur, and spironolactone.  No ACEI with elevated creatinine.  4. CKD: Creatinine stable around 1.6.  5. Right ankle pain: ?gout.  High uric acid, on colchicine.   [name] 04/24/2014 7:23 AM

## 2014-04-24 NOTE — Progress Notes (Signed)
Subjective: Pt denies additional episodes of chest pain.  Continues to report leg swelling, improving. All continues to complain of ankle pain in L ankle. States it is not as throbbing as his typical gout pain.   Objective: Vital signs in last 24 hours: Filed Vitals:   04/24/14 1400 04/24/14 1415 04/24/14 1430 04/24/14 1500  BP: 97/60 105/56 118/94 110/55  Pulse: 86 86 76 80  Temp:      TempSrc:      Resp:      Height:      Weight:      SpO2:  96% 95% 95%   Weight change:  -5lbs from yesterday  General: resting in bed, NAD HEENT: Ethel/at no scleral icterus Cardiac: RRR, no rubs, murmurs or gallops Pulm: clear to auscultation bilaterally, no wheezes, rales, or rhonchi Abd: soft, nontender, nondistended, BS present Ext: warm and well perfused, 1-2+ pedal edema to knee bilaterally  Neuro: alert and oriented X3, cranial nerves II-XII grossly intact MSK: mild ttp medial mallelolus and talar region. Mild warmth, no significant swelling.  No restriction with range of motion or pain with ROM  Lab Results: Basic Metabolic Panel:  Recent Labs Lab 04/22/14 0539 04/23/14 0604 04/24/14 0500  NA 141 142 142  K 4.4 4.3 4.3  CL 102 102 101  CO2 25 27 27   GLUCOSE 105* 101* 95  BUN 24* 20 20  CREATININE 1.50* 1.61* 1.68*  CALCIUM 9.1 9.2 9.3  MG 2.3  --   --    Liver Function Tests:  Recent Labs Lab 04/20/14 2138  AST 22  ALT 20  ALKPHOS 77  BILITOT 0.3  PROT 8.1  ALBUMIN 3.8   CBC:  Recent Labs Lab 04/21/14 0840 04/22/14 0539 04/23/14 0604 04/24/14 0500  WBC 12.1* 10.7* 13.5* 13.4*  NEUTROABS 8.1* 6.4  --   --   HGB 11.1* 11.1* 11.1* 11.0*  HCT 35.1* 36.0* 35.8* 35.3*  MCV 86.0 88.9 88.6 88.0  PLT 239 256 286 289   Cardiac Enzymes:  Recent Labs Lab 04/21/14 0249 04/21/14 0642 04/21/14 1415  TROPONINI <0.30 <0.30 <0.30   BNP:  Recent Labs Lab 04/20/14 2138  PROBNP 1869.0*   Anemia Panel:  Recent Labs Lab 04/21/14 0303  VITAMINB12 343  FOLATE  12.9  FERRITIN 540*  TIBC 274  IRON 17*  RETICCTPCT 2.0   Urine Drug Screen: Drugs of Abuse     Component Value Date/Time   LABOPIA NONE DETECTED 04/21/2014 1237   Studies/Results: No results found. Medications:  Scheduled Meds: . allopurinol  100 mg Oral Daily  . apixaban  5 mg Oral BID  . aspirin EC  325 mg Oral Daily  . carvedilol  25 mg Oral BID WC  . colchicine  0.6 mg Oral BID  . furosemide  80 mg Intravenous BID  . hydrALAZINE  50 mg Oral TID  . isosorbide mononitrate  60 mg Oral Daily  . potassium chloride SA  20 mEq Oral BID  . simvastatin  40 mg Oral QHS  . sodium chloride  3 mL Intravenous Q12H  . spironolactone  25 mg Oral Daily   Continuous Infusions:   PRN Meds:.sodium chloride, acetaminophen, gi cocktail, ondansetron (ZOFRAN) IV, sodium chloride Assessment/Plan:  53 y.o PMH of HLD, nonischemic chronic systolic heart failure (EF 25-30% while in Atrial Flutter), HTN, atrial flutter (s/p TEE DCCV 04/03/14), severe OSA, CKD presented with chest pain with exertion and acute on chronic CHF.  #Chest pain on exertion -cardiac catheterization today shows  no significant CAD. Chest pain likely secondary to volume overload, -continue medical management with home medications (Coreg 25 mg bid, Hydralazine 50 mg tid, Imdur 60, Spironolactone 25 mg qd)  #Acute on chronic systolic heart failure -negative 3L yesterday, weight -5lbs -Resume Lasix 80 mg IV BID, monitor creatinine -continue medical management with home medications (Coreg 25 mg bid, Hydralazine 50 mg tid, Imdur 60, Spironolactone 25 mg qd) -Strict intake and output, daily weights   #Atrial flutter -CHADSVASC 2. Currently in NSR at rest and with ambulation -Resume apixaban tonight  -continue Coreg   #HYPERTENSION -controlled on home medications -continue to monitor  #acute on CKD (chronic kidney disease) stage 3 -BL Creatinine 1.4-1.5. Creatinine stable at 1.68 -avoiding NSAIDs, ACEI  #normocytic  anemia  -anemia panel resulted likely anemia of chronic disease with low Fe, saturation, elevated Ferritin   #OSA (obstructive sleep apnea) -cpap qhs   #Gout -?recent gout flare  -contniue colchicine -continue prn Norco -continue Allopurinol   #F/E/N -NSL -will trend electrolytes -cardiac diet   #DVT px  -Apixaban   Dispo: Disposition is deferred at this time, awaiting improvement of current medical problems.  Anticipated discharge in approximately 1-2 day(s).   The patient does have a current PCP Luisa Dago(Everett Moding, MD) and [facility].  The patient does not have transportation limitations that hinder transportation to clinic appointments.  .Services Needed at time of discharge: Y = Yes, Blank = No PT:   OT:   RN:   Equipment:   Other:     LOS: 4 days   Ginger Carneiana M Savior Himebaugh, Med Student [phone] 04/24/2014, 3:33 PM

## 2014-04-24 NOTE — H&P (View-Only) (Signed)
Patient ID: Eric Murillo, male   DOB: 1960/12/13, 53 y.o.   MRN: [id number]     SUBJECTIVE: Weight down 16 lbs.  Main complaint today continues to be right ankle pain.  He denies dyspnea or chest pain at rest.  Scheduled Meds: . allopurinol  100 mg Oral Daily  . aspirin EC  325 mg Oral Daily  . carvedilol  25 mg Oral BID WC  . colchicine  0.6 mg Oral BID  . hydrALAZINE  50 mg Oral TID  . isosorbide mononitrate  60 mg Oral Daily  . potassium chloride SA  20 mEq Oral BID  . simvastatin  40 mg Oral QHS  . sodium chloride  3 mL Intravenous Q12H  . sodium chloride  3 mL Intravenous Q12H  . spironolactone  25 mg Oral Daily   Continuous Infusions: . heparin 2,250 Units/hr (04/23/14 1443)   PRN Meds:.sodium chloride, sodium chloride, acetaminophen, gi cocktail, HYDROcodone-acetaminophen, ondansetron (ZOFRAN) IV, sodium chloride, sodium chloride    Filed Vitals:   04/23/14 0500 04/23/14 1338 04/23/14 2053 04/24/14 0659  BP: 145/78 123/71 117/61 116/77  Pulse: 104 82 89 83  Temp: 99.8 F (37.7 C) 99.2 F (37.3 C) 99.2 F (37.3 C) 99.5 F (37.5 C)  TempSrc: Oral Oral Oral Oral  Resp: 18 18 18 18   Height:      Weight:   321 lb (145.605 kg) 316 lb (143.337 kg)  SpO2: 96% 97% 98% 95%    Intake/Output Summary (Last 24 hours) at 04/24/14 0723 Last data filed at 04/24/14 0405  Gross per 24 hour  Intake    480 ml  Output   4050 ml  Net  -3570 ml    LABS: Basic Metabolic Panel:  Recent Labs  82/95/6209/30/15 0539 04/23/14 0604 04/24/14 0500  NA 141 142 142  K 4.4 4.3 4.3  CL 102 102 101  CO2 25 27 27   GLUCOSE 105* 101* 95  BUN 24* 20 20  CREATININE 1.50* 1.61* 1.68*  CALCIUM 9.1 9.2 9.3  MG 2.3  --   --    Liver Function Tests: No results found for this basename: AST, ALT, ALKPHOS, BILITOT, PROT, ALBUMIN,  in the last 72 hours No results found for this basename: LIPASE, AMYLASE,  in the last 72 hours CBC:  Recent Labs  04/21/14 0840 04/22/14 0539 04/23/14 0604  04/24/14 0500  WBC 12.1* 10.7* 13.5* 13.4*  NEUTROABS 8.1* 6.4  --   --   HGB 11.1* 11.1* 11.1* 11.0*  HCT 35.1* 36.0* 35.8* 35.3*  MCV 86.0 88.9 88.6 88.0  PLT 239 256 286 289   Cardiac Enzymes:  Recent Labs  04/21/14 1415  TROPONINI <0.30   BNP: No components found with this basename: POCBNP,  D-Dimer: No results found for this basename: DDIMER,  in the last 72 hours Hemoglobin A1C: No results found for this basename: HGBA1C,  in the last 72 hours Fasting Lipid Panel: No results found for this basename: CHOL, HDL, LDLCALC, TRIG, CHOLHDL, LDLDIRECT,  in the last 72 hours Thyroid Function Tests: No results found for this basename: TSH, T4TOTAL, FREET3, T3FREE, THYROIDAB,  in the last 72 hours Anemia Panel: No results found for this basename: VITAMINB12, FOLATE, FERRITIN, TIBC, IRON, RETICCTPCT,  in the last 72 hours  RADIOLOGY: Dg Chest 2 View  04/20/2014   CLINICAL DATA:  Chest pain, shortness of breath  EXAM: CHEST  2 VIEW  COMPARISON:  Prior radiograph from 03/20/2014  FINDINGS: Cardiac silhouette is moderately enlarged. Mediastinal silhouette  within normal limits.  Lungs are mildly hypoinflated with elevation of the right hemidiaphragm, similar to prior. Mild central perihilar vascular congestion present. No pulmonary edema or pleural effusion. There is no focal infiltrate. No pneumothorax.  No acute osseus abnormality.  IMPRESSION: Stable cardiomegaly with no acute cardiopulmonary abnormality identified.   Electronically Signed   By: Rise Mu M.D.   On: 04/20/2014 22:08    PHYSICAL EXAM General: NAD Neck: JVP 9-10 cm, no thyromegaly or thyroid nodule.  Lungs: Clear to auscultation bilaterally with normal respiratory effort. CV: Nondisplaced PMI.  Heart regular S1/S2, soft S3, no murmur.  1+ ankle edema bilaterally. Abdomen: Soft, nontender, no hepatosplenomegaly, no distention.  Neurologic: Alert and oriented x 3.  Psych: Normal affect. Extremities: No  clubbing or cyanosis.   TELEMETRY: Reviewed telemetry pt in NSR  ASSESSMENT AND PLAN: 53 yo with history of chronic systolic CHF (nonischemic cardiomyopathy), CKD, and paroxysmal atrial flutter presented with exertional chest pain and acute on chronic systolic CHF.  1. Chest pain: Exertional chest pain for several days prior to admission, occurs with minimal exertion and resolves with rest.  Prior cath in 2012 without significant disease.  It is possible that the chest pain is related to volume overload, but the character of the chest pain is concerning.  Given his size, I do not think we would get a very good Cardiolite study on him. Troponin negative, no further chest pain in hospital.  - Continue ASA 81. - Apixaban held with heparin overlap for potential LHC/RHC.  Plan for cath today.   2. Atrial flutter: Patient remains in NSR after DCCV on 9/11.   3. Acute on chronic systolic CHF: Last EF was 25-30%, global hypokinesis.  Admitted with significant volume overload.  He diuresed well yesterday, will do RHC today.  - Hold Lasix prior to cath this morning.  Will decide on diuretic regimen based on RHC results. - Continue home Coreg, hydralazine/Imdur, and spironolactone.  No ACEI with elevated creatinine.  4. CKD: Creatinine stable around 1.6.  5. Right ankle pain: ?gout.  High uric acid, on colchicine.   [name] 04/24/2014 7:23 AM

## 2014-04-24 NOTE — CV Procedure (Signed)
    Cardiac Catheterization Procedure Note  Name: Eric Murillo MRN: [id number] DOB: 08/23/1960  Procedure: Right Heart Cath, Left Heart Cath, Selective Coronary Angiography  Indication: Exertional chest pain, CHF   Procedural Details: The right radial and left brachial areas were prepped, draped, and anesthetized with 1% lidocaine. There was a pre-existing peripheral IV in the left brachial area.  This was replaced with a 5 French venous sheath. A Swan-Ganz catheter was used for the right heart catheterization. Standard protocol was followed for recording of right heart pressures and sampling of oxygen saturations. Fick cardiac output was calculated. The right radial artery was entered using modified Seldinger technique and a 5 French arterial sheath was placed.  The patient received intra-arterial verapamil and weight-based heparin.  Standard Judkins catheters were used for selective coronary angiography. There were no immediate procedural complications. The patient was transferred to the post catheterization recovery area for further monitoring.  Procedural Findings: Hemodynamics (mmHg) RA mean 14 RV 63/14 PA 65/25, mean 42 PCWP Not able to obtain PCWP despite multiple wires, etc. LV 101/24 AO 100/72  Oxygen saturations: PA 61% AO 94%  Cardiac Output (Fick) 6.87  Cardiac Index (Fick) 2.69   Coronary angiography: Coronary dominance: right  Left mainstem: No significant coronary disease.   Left anterior descending (LAD): Mild luminal irregularities.   Left circumflex (LCx): No significant coronary disease.   Right coronary artery (RCA): No significant coronary disease.   Left ventriculography: Not done, CKD.  Contrast: 35 cc   Final Conclusions:  Right and left heart filling pressures remain elevated though not markedly.  Moderate pulmonary hypertension likely due to elevated left atrial pressure.  Preserved cardiac output.  No significant CAD.  Exertional chest pain  likely is due to volume overload.   Recommendations:  Resume apixaban 8 hours after sheath out.  Resume Lasix IV this evening.  Will give Lasix 80 mg IV bid for 1-2 days longer, followe creatinine closely.   [name] 04/24/2014, 12:57 PM

## 2014-04-25 DIAGNOSIS — Z6841 Body Mass Index (BMI) 40.0 and over, adult: Secondary | ICD-10-CM

## 2014-04-25 DIAGNOSIS — I5022 Chronic systolic (congestive) heart failure: Secondary | ICD-10-CM

## 2014-04-25 DIAGNOSIS — I429 Cardiomyopathy, unspecified: Secondary | ICD-10-CM

## 2014-04-25 LAB — BASIC METABOLIC PANEL
ANION GAP: 15 (ref 5–15)
BUN: 21 mg/dL (ref 6–23)
CO2: 23 mEq/L (ref 19–32)
CREATININE: 1.53 mg/dL — AB (ref 0.50–1.35)
Calcium: 9.5 mg/dL (ref 8.4–10.5)
Chloride: 105 mEq/L (ref 96–112)
GFR calc non Af Amer: 50 mL/min — ABNORMAL LOW (ref >90)
GFR, EST AFRICAN AMERICAN: 58 mL/min — AB (ref >90)
Glucose, Bld: 112 mg/dL — ABNORMAL HIGH (ref 70–99)
Potassium: 4.5 mEq/L (ref 3.7–5.3)
Sodium: 143 mEq/L (ref 137–147)

## 2014-04-25 LAB — CBC
HCT: 35.7 % — ABNORMAL LOW (ref 39.0–52.0)
Hemoglobin: 11.3 g/dL — ABNORMAL LOW (ref 13.0–17.0)
MCH: 27.2 pg (ref 26.0–34.0)
MCHC: 31.7 g/dL (ref 30.0–36.0)
MCV: 85.8 fL (ref 78.0–100.0)
Platelets: 325 10*3/uL (ref 150–400)
RBC: 4.16 MIL/uL — ABNORMAL LOW (ref 4.22–5.81)
RDW: 13.3 % (ref 11.5–15.5)
WBC: 12.9 10*3/uL — ABNORMAL HIGH (ref 4.0–10.5)

## 2014-04-25 LAB — APTT: aPTT: 35 seconds (ref 24–37)

## 2014-04-25 NOTE — Progress Notes (Signed)
Subjective: Leg swelling significantly improves. No SOB, no chest pain.   Objective: Vital signs in last 24 hours: Filed Vitals:   04/24/14 2100 04/25/14 0500 04/25/14 0745 04/25/14 0947  BP: 137/75 114/58 103/69 113/64  Pulse: 80 87 79   Temp: 98.9 F (37.2 C) 99.2 F (37.3 C)    TempSrc: Oral Oral    Resp: 18 18    Height:      Weight:  143.155 kg (315 lb 9.6 oz)    SpO2: 100% 100%     Weight change: -2.449 kg (-5 lb 6.4 oz) -15lb since admission   General: resting in bed, NAD HEENT: [place]/at no scleral icterus Cardiac: RRR, no rubs, murmurs or gallops Pulm: clear to auscultation bilaterally, no wheezes, rales, or rhonchi Abd: soft, nontender, nondistended, BS present Ext: warm and well perfused, 1+ pedal edema to mid-shin bilaterally Neuro: alert and oriented X3, cranial nerves II-XII grossly intact MSK: mild ttp medial mallelolus and talar region. Mild warmth, no significant swelling.  No restriction with range of motion or pain with ROM  Lab Results: Basic Metabolic Panel:  Recent Labs Lab 04/22/14 0539  04/24/14 0500 04/25/14 0530  NA 141  < > 142 143  K 4.4  < > 4.3 4.5  CL 102  < > 101 105  CO2 25  < > 27 23  GLUCOSE 105*  < > 95 112*  BUN 24*  < > 20 21  CREATININE 1.50*  < > 1.68* 1.53*  CALCIUM 9.1  < > 9.3 9.5  MG 2.3  --   --   --   < > = values in this interval not displayed. Liver Function Tests:  Recent Labs Lab 04/20/14 2138  AST 22  ALT 20  ALKPHOS 77  BILITOT 0.3  PROT 8.1  ALBUMIN 3.8   CBC:  Recent Labs Lab 04/21/14 0840 04/22/14 0539  04/24/14 0500 04/25/14 0530  WBC 12.1* 10.7*  < > 13.4* 12.9*  NEUTROABS 8.1* 6.4  --   --   --   HGB 11.1* 11.1*  < > 11.0* 11.3*  HCT 35.1* 36.0*  < > 35.3* 35.7*  MCV 86.0 88.9  < > 88.0 85.8  PLT 239 256  < > 289 325  < > = values in this interval not displayed. Cardiac Enzymes:  Recent Labs Lab 04/21/14 0249 04/21/14 0642 04/21/14 1415  TROPONINI <0.30 <0.30 <0.30    BNP:  Recent Labs Lab 04/20/14 2138  PROBNP 1869.0*   Anemia Panel:  Recent Labs Lab 04/21/14 0303  VITAMINB12 343  FOLATE 12.9  FERRITIN 540*  TIBC 274  IRON 17*  RETICCTPCT 2.0   Urine Drug Screen: Drugs of Abuse     Component Value Date/Time   LABOPIA NONE DETECTED 04/21/2014 1237   Studies/Results: No results found. Medications:  Scheduled Meds: . allopurinol  100 mg Oral Daily  . apixaban  5 mg Oral BID  . aspirin EC  325 mg Oral Daily  . carvedilol  25 mg Oral BID WC  . colchicine  0.6 mg Oral BID  . furosemide  80 mg Intravenous BID  . hydrALAZINE  50 mg Oral TID  . isosorbide mononitrate  60 mg Oral Daily  . potassium chloride SA  20 mEq Oral BID  . simvastatin  40 mg Oral QHS  . sodium chloride  3 mL Intravenous Q12H  . spironolactone  25 mg Oral Daily   Continuous Infusions:   PRN Meds:.sodium chloride, acetaminophen, gi cocktail,  ondansetron (ZOFRAN) IV, sodium chloride Assessment/Plan:  53 y.o PMH of HLD, nonischemic chronic systolic heart failure (EF 25-30% while in Atrial Flutter), HTN, atrial flutter (s/p TEE DCCV 04/03/14), severe OSA, CKD presented with chest pain with exertion and acute on chronic CHF.  #Chest pain on exertion -cardiac catheterization showed no significant CAD. Chest pain likely secondary to volume overload. -continue medical management with home medications (Coreg 25 mg bid, Hydralazine 50 mg tid, Imdur 60, Spironolactone 25 mg qd)  #Acute on chronic systolic heart failure -negative 15lbs since admission. -Contine Lasix 80 mg IV BID, monitor creatinine -continue medical management with home medications (Coreg 25 mg bid, Hydralazine 50 mg tid, Imdur 60, Spironolactone 25 mg qd) -Strict intake and output, daily weights   #Atrial flutter -CHADSVASC 2. Currently in NSR at rest and with ambulation -Continue apixaban -continue Coreg   #HYPERTENSION -controlled on home medications -continue to monitor  #acute on CKD  (chronic kidney disease) stage 3 -BL Creatinine 1.4-1.5. Creatinine stable at 1.5 -avoiding NSAIDs, ACEI  #normocytic anemia  -anemia panel resulted likely anemia of chronic disease with low Fe, saturation, elevated Ferritin   #OSA (obstructive sleep apnea) -cpap qhs   #Gout -?recent gout flare  -contniue colchicine -continue prn Norco -continue Allopurinol   #F/E/N -NSL -will trend electrolytes -cardiac diet   #DVT px  -Apixaban   Dispo: Disposition is deferred at this time, awaiting improvement of current medical problems.  Anticipated discharge in approximately 1-2 day(s).   The patient does have a current PCP Luisa Dago(Everett Moding, MD) and does not need an [facility] hospital follow-up appointment after discharge.  The patient does not have transportation limitations that hinder transportation to clinic appointments.  .Services Needed at time of discharge: Y = Yes, Blank = No PT:   OT:   RN:   Equipment:   Other:     LOS: 5 days   Ginger Carneiana M Jeanna Giuffre, Med Student [phone] 04/25/2014, 12:01 PM

## 2014-04-25 NOTE — Progress Notes (Signed)
I have seen the patient and reviewed the daily progress note by Kerby Noraiana Woodall MS 4 and discussed the care of the patient with them.  See below for documentation of my findings, assessment, and plans.  Subjective: Patient reports he continues to feel better with diuresis, also notes his right ankle pain is improved.  Objective: Vital signs in last 24 hours: Filed Vitals:   04/25/14 0500 04/25/14 0745 04/25/14 0947 04/25/14 1444  BP: 114/58 103/69 113/64 112/54  Pulse: 87 79  79  Temp: 99.2 F (37.3 C)   98.8 F (37.1 C)  TempSrc: Oral   Oral  Resp: 18   19  Height:      Weight: 315 lb 9.6 oz (143.155 kg)     SpO2: 100%   97%   Weight change: -5 lb 6.4 oz (-2.449 kg)  Intake/Output Summary (Last 24 hours) at 04/25/14 1450 Last data filed at 04/25/14 0947  Gross per 24 hour  Intake    603 ml  Output   1450 ml  Net   -847 ml   General: sitting in chair Cardiac: RRR, no murmur  Pulm: CTAB  Abd: obese, soft, nontender, nondistended, BS present  Ext: warm and well perfused, 1+ edema to knees bilaterally, right ankle tenderness improved Neuro: alert and oriented X3  Lab Results: Reviewed and documented in Electronic Record Micro Results: Reviewed and documented in Electronic Record Studies/Results: Reviewed and documented in Electronic Record Medications: I have reviewed the patient's current medications. Scheduled Meds: . allopurinol  100 mg Oral Daily  . apixaban  5 mg Oral BID  . aspirin EC  325 mg Oral Daily  . carvedilol  25 mg Oral BID WC  . colchicine  0.6 mg Oral BID  . furosemide  80 mg Intravenous BID  . hydrALAZINE  50 mg Oral TID  . isosorbide mononitrate  60 mg Oral Daily  . potassium chloride SA  20 mEq Oral BID  . simvastatin  40 mg Oral QHS  . sodium chloride  3 mL Intravenous Q12H  . spironolactone  25 mg Oral Daily   Continuous Infusions:  PRN Meds:.sodium chloride, acetaminophen, gi cocktail, ondansetron (ZOFRAN) IV, sodium  chloride Assessment/Plan: 53 y.o PMH obesity, HLD, nonischemic chronic systolic heart failure, HTN, atrial flutter (s/p TEE DCCV 04/03/14), severe OSA, CKD presented with chest pain with exertion.   #Chest pain on exertion  - Much improved with diuresis -No significant CAD on cath yesterday, likely related to CHF.  #acute on chronic systolic heart failure  -continue medical management with home medications (Coreg 25 mg bid, Hydralazine 50 mg tid, Imdur 60, Spironolactone 25 mg qd, no ACEi 2/2 AKI)  - Left and right heart pressures elevated on cath, continue Lasix 80mg  IV BID -Strict intake and output, daily weights   #Atrial flutter  -CHADSVASC 2. Currently in NSR  -continue Coreg  -eliquis for A/C -plan for EP follow up outpatient   #HYPERTENSION  -controlled   #acute on CKD (chronic kidney disease) stage 3  -BL Creatinine 1.4-1.5. Creatinine back down to baseline.  -Holding ACEi   #OSA (obstructive sleep apnea)  -cpap qhs   #Gout - improved -recent gout flare  -colchicine  -continue Allopurinol   #F/E/N  -NSL  -will trend electrolytes  -cardiac diet   #DVT px  - IV heparin  Dispo: Disposition is deferred at this time, awaiting improvement of current medical problems.  Anticipated discharge in approximately 1-2 day(s).   The patient does have a current  PCP Luisa Dago, MD) and does not need an [facility] hospital follow-up appointment after discharge.  The patient does have transportation limitations that hinder transportation to clinic appointments.  .Services Needed at time of discharge: Y = Yes, Blank = No PT:   OT:   RN:   Equipment:   Other:     LOS: 5 days   Gust Rung, DO 04/25/2014, 2:50 PM

## 2014-04-25 NOTE — Progress Notes (Signed)
Patient ID: Eric Murillo, male   DOB: [birth year], 53 y.o.   MRN: [id number]     SUBJECTIVE: Weight down 15 lbs. Cath yesterday. Minimal CAD, right and left heart pressures moderately elevated.   Scheduled Meds: . allopurinol  100 mg Oral Daily  . apixaban  5 mg Oral BID  . aspirin EC  325 mg Oral Daily  . carvedilol  25 mg Oral BID WC  . colchicine  0.6 mg Oral BID  . furosemide  80 mg Intravenous BID  . hydrALAZINE  50 mg Oral TID  . isosorbide mononitrate  60 mg Oral Daily  . potassium chloride SA  20 mEq Oral BID  . simvastatin  40 mg Oral QHS  . sodium chloride  3 mL Intravenous Q12H  . spironolactone  25 mg Oral Daily   Continuous Infusions:   PRN Meds:.sodium chloride, acetaminophen, gi cocktail, ondansetron (ZOFRAN) IV, sodium chloride    Filed Vitals:   04/24/14 1619 04/24/14 2100 04/25/14 0500 04/25/14 0745  BP: 114/72 137/75 114/58 103/69  Pulse: 75 80 87 79  Temp:  98.9 F (37.2 C) 99.2 F (37.3 C)   TempSrc:  Oral Oral   Resp:  18 18   Height:      Weight:   315 lb 9.6 oz (143.155 kg)   SpO2: 95% 100% 100%     Intake/Output Summary (Last 24 hours) at 04/25/14 0924 Last data filed at 04/25/14 0500  Gross per 24 hour  Intake    240 ml  Output   1450 ml  Net  -1210 ml    LABS: Basic Metabolic Panel:  Recent Labs  78/46/9608/09/07 0500 04/25/14 0530  NA 142 143  K 4.3 4.5  CL 101 105  CO2 27 23  GLUCOSE 95 112*  BUN 20 21  CREATININE 1.68* 1.53*  CALCIUM 9.3 9.5   CBC:  Recent Labs  04/24/14 0500 04/25/14 0530  WBC 13.4* 12.9*  HGB 11.0* 11.3*  HCT 35.3* 35.7*  MCV 88.0 85.8  PLT 289 325   RADIOLOGY: Dg Chest 2 View  04/20/2014   CLINICAL DATA:  Chest pain, shortness of breath  EXAM: CHEST  2 VIEW  COMPARISON:  Prior radiograph from 03/20/2014  FINDINGS: Cardiac silhouette is moderately enlarged. Mediastinal silhouette within normal limits.  Lungs are mildly hypoinflated with elevation of the right hemidiaphragm, similar to prior. Mild  central perihilar vascular congestion present. No pulmonary edema or pleural effusion. There is no focal infiltrate. No pneumothorax.  No acute osseus abnormality.  IMPRESSION: Stable cardiomegaly with no acute cardiopulmonary abnormality identified.   Electronically Signed   By: Rise MuBenjamin  McClintock M.D.   On: 04/20/2014 22:08    PHYSICAL EXAM General: NAD Neck: JVP 9-10 cm, no thyromegaly or thyroid nodule.  Lungs: Clear to auscultation bilaterally with normal respiratory effort. CV: Nondisplaced PMI.  Heart regular S1/S2, soft S3, no murmur.  1+ ankle edema bilaterally. Cath site normal.  Abdomen: Soft, nontender, no hepatosplenomegaly, no distention.  Neurologic: Alert and oriented x 3.  Psych: Normal affect. Extremities: No clubbing or cyanosis.   TELEMETRY: Reviewed telemetry pt in NSR  ASSESSMENT AND PLAN:  53 yo with history of chronic systolic CHF (nonischemic cardiomyopathy), CKD, and paroxysmal atrial flutter presented with exertional chest pain and acute on chronic systolic CHF.   1. Chest pain: Exertional chest pain for several days prior to admission, occurs with minimal exertion and resolves with rest.  Cath without significant disease.  It is possible that the chest  pain is related to volume overload - Continue ASA 81. - Apixaban   2. Atrial flutter: Patient remains in NSR after DCCV on 9/11.   3. Acute on chronic systolic CHF: Last EF was 25-30%, global hypokinesis.  Admitted with significant volume overload.  He is diuresing well, continue with IV lasix since pressure remained elevated.  - Continue home Coreg, hydralazine/Imdur, and spironolactone.  No ACEI with elevated creatinine.  4. CKD: Creatinine stable around 1.6.  5. Right ankle pain: ?gout.  High uric acid, on colchicine.   SKAINS, MARK 04/25/2014 9:24 AM

## 2014-04-26 LAB — BASIC METABOLIC PANEL
ANION GAP: 13 (ref 5–15)
BUN: 21 mg/dL (ref 6–23)
CALCIUM: 9.4 mg/dL (ref 8.4–10.5)
CHLORIDE: 101 meq/L (ref 96–112)
CO2: 26 mEq/L (ref 19–32)
CREATININE: 1.61 mg/dL — AB (ref 0.50–1.35)
GFR, EST AFRICAN AMERICAN: 55 mL/min — AB (ref >90)
GFR, EST NON AFRICAN AMERICAN: 47 mL/min — AB (ref >90)
Glucose, Bld: 97 mg/dL (ref 70–99)
Potassium: 4.6 mEq/L (ref 3.7–5.3)
Sodium: 140 mEq/L (ref 137–147)

## 2014-04-26 LAB — APTT: aPTT: 34 seconds (ref 24–37)

## 2014-04-26 MED ORDER — POTASSIUM CHLORIDE CRYS ER 20 MEQ PO TBCR
20.0000 meq | EXTENDED_RELEASE_TABLET | Freq: Every day | ORAL | Status: DC
  Administered 2014-04-26 – 2014-04-27 (×2): 20 meq via ORAL
  Filled 2014-04-26: qty 1

## 2014-04-26 NOTE — Discharge Summary (Signed)
Name: Eric Murillo MRN: [id number] DOB: February 25, 1961 53 y.o. PCP: Luisa Dago, MD  Date of Admission: 04/20/2014  9:54 PM Date of Discharge: 04/27/2014 Attending Physician: Levert Feinstein, MD  Discharge Diagnosis: Principal Problem:   Acute on chronic systolic and diastolic heart failure, NYHA class 4 Active Problems:   HYPERTENSION   CKD (chronic kidney disease) stage 3, GFR 30-59 ml/min   Chronic systolic heart failure   Cardiomyopathy, nonischemic   OSA (obstructive sleep apnea)   Atrial flutter   Chest pain on exertion   Anemia of chronic disease   Gout   Chest pain   Pilonidal cyst  Discharge Medications:   Medication List         allopurinol 100 MG tablet  Commonly known as:  ZYLOPRIM  Take 1 tablet (100 mg total) by mouth daily.     carvedilol 25 MG tablet  Commonly known as:  COREG  Take 1 tablet (25 mg total) by mouth 2 (two) times daily with a meal.     colchicine 0.6 MG tablet  Take 1 tablet (0.6 mg total) by mouth 2 (two) times daily.     ELIQUIS 5 MG Tabs tablet  Generic drug:  apixaban  Take 5 mg by mouth 2 (two) times daily.     hydrALAZINE 50 MG tablet  Commonly known as:  APRESOLINE  Take 1 tablet (50 mg total) by mouth 3 (three) times daily.     isosorbide mononitrate 60 MG 24 hr tablet  Commonly known as:  IMDUR  Take 1 tablet (60 mg total) by mouth daily.     potassium chloride SA 20 MEQ tablet  Commonly known as:  K-DUR,KLOR-CON  Take 1 tablet (20 mEq total) by mouth 2 (two) times daily.     simvastatin 40 MG tablet  Commonly known as:  ZOCOR  Take 1 tablet (40 mg total) by mouth at bedtime.     spironolactone 25 MG tablet  Commonly known as:  ALDACTONE  Take 1 tablet (25 mg total) by mouth daily.     torsemide 20 MG tablet  Commonly known as:  DEMADEX  Take 40-60 mg by mouth 2 (two) times daily. Takes 3 tablets every morning and 2 tablets every evening.     traMADol 50 MG tablet  Commonly known as:  ULTRAM  Take by  mouth every 6 (six) hours as needed for moderate pain.        Disposition and follow-up:   Mr.Caelum Badgley was discharged from [facility] in Stable condition.  At the hospital follow up visit please address:  1.  Exertional chest pain (ensure that he is not having any further episodes), CHF (weight compliance with medications), Ankle pain (Gout)-need titration of Allopurinol, Sleep apnea (refused to wear cpap while in hosptial), pilonidal cyst (will need referral to general surgeon for removal)   2.  Labs / imaging needed at time of follow-up: BMET  3.  Pending labs/ test needing follow-up: none  Follow-up Appointments: Follow-up Information   Follow up with Genelle Gather, MD On 04/30/2014. (10:15)    Specialty:  Internal Medicine   Contact information:   [address] [place] [address] [phone]       Follow up with [name], MD On 05/04/2014. (at 9:30 Garage Code 0500)    Specialty:  Cardiology   Contact information:   [address] [place] [address] [phone]     10/8 w/ Dr. Sherrine Maples  Follow-up in CHF clinic next week   Discharge Instructions: Discharge Instructions   (HEART FAILURE PATIENTS) Call MD:  Anytime you have any of the following symptoms: 1) 3 pound weight gain in 24 hours or 5 pounds in 1 week 2) shortness of breath, with or without a dry hacking cough 3) swelling in the hands, feet or stomach 4) if you have to sleep on extra pillows at night in order to breathe.    Complete by:  As directed      Diet - low sodium heart healthy    Complete by:  As directed      Discharge instructions    Complete by:  As directed   Please take medications as listed.     Increase activity slowly    Complete by:  As directed            Consultations: Cardiology  Procedures Performed:  Dg Chest 2 View  04/20/2014   CLINICAL DATA:  Chest pain, shortness of breath  EXAM: CHEST  2 VIEW  COMPARISON:  Prior radiograph from  03/20/2014  FINDINGS: Cardiac silhouette is moderately enlarged. Mediastinal silhouette within normal limits.  Lungs are mildly hypoinflated with elevation of the right hemidiaphragm, similar to prior. Mild central perihilar vascular congestion present. No pulmonary edema or pleural effusion. There is no focal infiltrate. No pneumothorax.  No acute osseus abnormality.  IMPRESSION: Stable cardiomegaly with no acute cardiopulmonary abnormality identified.   Electronically Signed   By: Rise Mu M.D.   On: 04/20/2014 22:08   Cardiac Cath:  Hemodynamics (mmHg)  RA mean 14  RV 63/14  PA 65/25, mean 42  PCWP Not able to obtain PCWP despite multiple wires, etc.  LV 101/24  AO 100/72  Oxygen saturations:  PA 61%  AO 94%  Cardiac Output (Fick) 6.87  Cardiac Index (Fick) 2.69  Coronary angiography:  Coronary dominance: right  Left mainstem: No significant coronary disease.  Left anterior descending (LAD): Mild luminal irregularities.  Left circumflex (LCx): No significant coronary disease.  Right coronary artery (RCA): No significant coronary disease.  Left ventriculography: Not done, CKD.  Contrast: 35 cc   Final Conclusions: Right and left heart filling pressures remain elevated though not markedly. Moderate pulmonary hypertension likely due to elevated left atrial pressure. Preserved cardiac output. No significant CAD. Exertional chest pain likely is due to volume overload.   Admission HPI: Eric Murillo is a 53 year old man with history of obesity, HLD, HTN, gout, CKD, nonischemic cardiomyopathy with CHF (EF 45%), and paroxysmal atrial flutter. Mr. Meschke was seen in clinic on 03/20/14 with complaint of shortness of breath on exertion, and he was found to be in atrial flutter. He follows with Dr. Shirlee Latch of Heart Failure clinic who started him on Eliquis, and he underwent successful DCCV on 04/03/14. TEE at that time showed his EF was down to 25-30% from 45% on 03/10/13 with mild to  moderate MR. He is scheduled to see EP on 05/11/14 to discuss possible ablation. He has been followed closely in heart failure clinic for volume overload and elevated creatinine with diuresis. His torsemide was recently increased to improve his diuresis and his enalapril has been held due to concern of AKI. Today, he reports developing exertional chest tightness and shortness of breath. He says the symptoms are similar to what he felt when he was in atrial flutter before, and he does not have symptoms at rest. However, he is only able to ambulate a few steps before  developing severe chest tightness. He says that his lower extremity edema may be a little bit worse than normal, and he sleeps on his side with two pillows at night due to orthopnea. He denies leg pain or unilateral swelling. He does report some indigestion with frequent burping, but denies burning or metallic taste in his mouth. In the ER, he was noted to be in sinus rhythm. Initial troponin and EKG were negative for ischemia, and chest x-ray showed no acute abnormalities. He was given aspirin 324 mg.  Hospital Course by problem list: Principal Problem:   Acute on chronic systolic and diastolic heart failure, NYHA class 4 Active Problems:   HYPERTENSION   CKD (chronic kidney disease) stage 3, GFR 30-59 ml/min   Chronic systolic heart failure   Cardiomyopathy, nonischemic   OSA (obstructive sleep apnea)   Atrial flutter   Chest pain on exertion   Anemia of chronic disease   Gout   Chest pain   Pilonidal cyst   1. Chest pain: Patient presented with exertional chest pain and SOB, which he described as similar to previous symptoms when in atrial fibrillation. He was found to be in NSR at the time of presentation. In March of 2012, he had a Cath which showed no angiographic CAD. He received aspirin in the ED and was admitted to rule out ACS. Troponin x3 was negative and EKG showed no evidence of ischemia. He underwent catheterization, which  showed no evidence of CAD. His chest pain was thought to be secondary to fluid overload, and it improved with diuresis. He remained in NSR for the duration of his hospitalization. At the time of discharge, his chest pain and SOB had resolved. He was discharged on torsemide 60 qam/40 qpm, KCl 20 bid, hydralazine 50 mg tid, Imdur 60 daily, Coreg 25 mg bid, apixaban 5 mg bid, home statin dose, and spironolactone 25 mg daily.   2. Acute on chronic systolic and diastolic heart failure: The patient had been following closely with the heart failure clinic due to increased volume overload over the past couple of months. A recent TEE showed EF of 25-30%, however this was likely related to elevated rate from atrial fibrillation. At the time of admission, his weight was up 18lbs from his dry weight. He was diuresed with IV lasix 80mg  BID, and was -20lbs at the the time of discharge. He was transitioned back to po torsemide 60 qam/40 qpm. He will follow up in the CHF clinic next week.  3. Atrial flutter: The Patient remained in NSR after DCCV on 9/11. No episodes of atrial flutter while hospitalized. He was discharged on apixaban 5 mg BID.   4. CKD, stage 3: The patient's creatinine was 2.0 on admission, up from his baseline of 1.4. With diuresis, his creatinine trended down and was stable at 1.6 at the time of discharge.   5. Obstructive sleep apnea: Patient refused to use cpap while in the hospital. He will need f/u regarding this issue as an outpatient.   6. Gout: Patient has a history of gout and complained of R ankle pain on admission. He is on allopurinol 100mg  daily at home. He was started on colchicine 0.6 daily and his ankle pain slowly improved while in the hospital. He continued to complain of some continued pain with weight-bearing at the time of discharge. He was discharged on allopurinol 100 mg daily and colchicine 0.6 BID. Please repeat Uric acid and titrate allopurinol accordingly  7. Pilonidal cyst:  Patient complained  of some pain in his coccygeal region and was found to have a small pilonidal cyst that expressed a small amount of fluid. He will need referrral to a general surgeon for removal.   Discharge Vitals:   BP 120/78  Pulse 80  Temp(Src) 99.5 F (37.5 C) (Oral)  Resp 18  Ht 5' 9.5" (1.765 m)  Wt 309 lb (140.161 kg)  BMI 44.99 kg/m2  SpO2 97%  Discharge Labs:  Results for orders placed during the hospital encounter of 04/20/14 (from the past 24 hour(s))  BASIC METABOLIC PANEL     Status: Abnormal   Collection Time    04/27/14  4:10 AM      Result Value Ref Range   Sodium 140  137 - 147 mEq/L   Potassium 4.3  3.7 - 5.3 mEq/L   Chloride 100  96 - 112 mEq/L   CO2 27  19 - 32 mEq/L   Glucose, Bld 97  70 - 99 mg/dL   BUN 22  6 - 23 mg/dL   Creatinine, Ser 1.611.70 (*) 0.50 - 1.35 mg/dL   Calcium 9.2  8.4 - 09.610.5 mg/dL   GFR calc non Af Amer 44 (*) >90 mL/min   GFR calc Af Amer 51 (*) >90 mL/min   Anion gap 13  5 - 15  APTT     Status: None   Collection Time    04/27/14  4:10 AM      Result Value Ref Range   aPTT 36  24 - 37 seconds    Signed: Gust RungErik C Evani Shrider, DO 04/27/2014, 2:00 PM   Services Ordered on Discharge: none Equipment Ordered on Discharge: none

## 2014-04-26 NOTE — Progress Notes (Signed)
Patient ID: Eric KelchJasper Murillo, male   DOB: [birth year], 53 y.o.   MRN: [id number]     SUBJECTIVE: -10L out. Cath - Minimal CAD, right and left heart pressures moderately elevated. Feels better.  Scheduled Meds: . allopurinol  100 mg Oral Daily  . apixaban  5 mg Oral BID  . aspirin EC  325 mg Oral Daily  . carvedilol  25 mg Oral BID WC  . colchicine  0.6 mg Oral BID  . furosemide  80 mg Intravenous BID  . hydrALAZINE  50 mg Oral TID  . isosorbide mononitrate  60 mg Oral Daily  . potassium chloride SA  20 mEq Oral BID  . simvastatin  40 mg Oral QHS  . sodium chloride  3 mL Intravenous Q12H  . spironolactone  25 mg Oral Daily   Continuous Infusions:   PRN Meds:.sodium chloride, acetaminophen, gi cocktail, ondansetron (ZOFRAN) IV, sodium chloride    Filed Vitals:   04/25/14 1730 04/25/14 2058 04/26/14 0500 04/26/14 0820  BP: 124/63 99/65 116/68 112/68  Pulse: 85 80 81 82  Temp:  98.8 F (37.1 C) 99.2 F (37.3 C)   TempSrc:  Oral Oral   Resp:  18 18   Height:      Weight:   313 lb (141.976 kg)   SpO2:  98% 97%     Intake/Output Summary (Last 24 hours) at 04/26/14 0842 Last data filed at 04/26/14 0500  Gross per 24 hour  Intake    363 ml  Output   2850 ml  Net  -2487 ml    LABS: Basic Metabolic Panel:  Recent Labs  40/98/1108/10/05 0530 04/26/14 0252  NA 143 140  K 4.5 4.6  CL 105 101  CO2 23 26  GLUCOSE 112* 97  BUN 21 21  CREATININE 1.53* 1.61*  CALCIUM 9.5 9.4   CBC:  Recent Labs  04/24/14 0500 04/25/14 0530  WBC 13.4* 12.9*  HGB 11.0* 11.3*  HCT 35.3* 35.7*  MCV 88.0 85.8  PLT 289 325   RADIOLOGY: Dg Chest 2 View  04/20/2014   IMPRESSION: Stable cardiomegaly with no acute cardiopulmonary abnormality identified.     PHYSICAL EXAM General: NAD Neck: JVP 9-10 cm, no thyromegaly or thyroid nodule.  Lungs: Clear to auscultation bilaterally with normal respiratory effort. CV: Nondisplaced PMI.  Heart regular S1/S2, soft S3, no murmur.  1-2+ ankle edema  bilaterally. Cath site normal.  Abdomen: Soft, nontender, no hepatosplenomegaly, no distention.  Neurologic: Alert and oriented x 3.  Psych: Normal affect. Extremities: No clubbing or cyanosis.   TELEMETRY: Reviewed telemetry pt in NSR  ASSESSMENT AND PLAN:  53 yo with history of chronic systolic CHF (nonischemic cardiomyopathy), CKD, and paroxysmal atrial flutter presented with exertional chest pain and acute on chronic systolic CHF.   1. Chest pain: Exertional chest pain for several days prior to admission, occurs with minimal exertion and resolves with rest.  Cath without significant disease.  It is possible that the chest pain is related to volume overload Will DC ASA since he is on apixaban.   2. Atrial flutter: Patient remains in NSR after DCCV on 9/11.  - Apixaban    3. Acute on chronic systolic CHF: Last EF was 25-30%, global hypokinesis.  Admitted with significant volume overload.  He is diuresing well, continue with IV lasix since pressure remained elevated. -10 L. Still has more to go.  - Continue home Coreg, hydralazine/Imdur, and spironolactone.  No ACEI with elevated creatinine.   4. CKD: Creatinine  stable around 1.6. K 4.6. Will cut back KCL to once a day. Also on Spirolactone.   5. Right ankle pain: ?gout.  High uric acid, on colchicine.   Alexa Golebiewski 04/26/2014 8:42 AM

## 2014-04-26 NOTE — Progress Notes (Signed)
I have seen the patient and reviewed the daily progress note by Kerby Noraiana Woodall MS 4 and discussed the care of the patient with them.  See below for documentation of my findings, assessment, and plans.  Subjective: Patient feeling much better with diuresis, notes some cramping in his back and a nodule on his butt that he feels is draining. Objective: Vital signs in last 24 hours: Filed Vitals:   04/26/14 0500 04/26/14 0820 04/26/14 1040 04/26/14 1504  BP: 116/68 112/68 105/63 104/56  Pulse: 81 82  86  Temp: 99.2 F (37.3 C)   99 F (37.2 C)  TempSrc: Oral   Oral  Resp: 18   16  Height:      Weight: 313 lb (141.976 kg)     SpO2: 97%   96%   Weight change: -2 lb 9.6 oz (-1.179 kg)  Intake/Output Summary (Last 24 hours) at 04/26/14 1553 Last data filed at 04/26/14 0500  Gross per 24 hour  Intake      0 ml  Output   1850 ml  Net  -1850 ml   General: resting in bed HEENT: PERRL, EOMI, no scleral icterus Cardiac: RRR, no rubs, murmurs or gallops Pulm: clear to auscultation bilaterally, moving normal volumes of air Abd: soft, nontender, nondistended, BS present Buttock: area of induration on upper left buttock with some white drainage a butt crease suspicious of pilonidal cyst Ext: warm and well perfused, no pedal edema Neuro: alert and oriented X3, cranial nerves II-XII grossly intact  Lab Results: Reviewed and documented in Electronic Record Micro Results: Reviewed and documented in Electronic Record Studies/Results: Reviewed and documented in Electronic Record Medications: I have reviewed the patient's current medications. Scheduled Meds: . allopurinol  100 mg Oral Daily  . apixaban  5 mg Oral BID  . carvedilol  25 mg Oral BID WC  . colchicine  0.6 mg Oral BID  . furosemide  80 mg Intravenous BID  . hydrALAZINE  50 mg Oral TID  . isosorbide mononitrate  60 mg Oral Daily  . potassium chloride SA  20 mEq Oral Daily  . simvastatin  40 mg Oral QHS  . sodium chloride  3  mL Intravenous Q12H  . spironolactone  25 mg Oral Daily   Continuous Infusions:  PRN Meds:.sodium chloride, acetaminophen, gi cocktail, ondansetron (ZOFRAN) IV, sodium chloride Assessment/Plan:   Acute on chronic systolic and diastolic heart failure, NYHA class 4 - Cath showed elevated filling pressures - Down 17 lbs since admission but still more diresis to go - Continue Lasix 80mg  IV BID    HYPERTENSION - Well controlled, continue home meds    CKD (chronic kidney disease) stage 3, GFR 30-59 ml/min - SCr back down to baseline - Monitor with diuresis    Cardiomyopathy, nonischemic - Cath without significant obstrubtion    OSA (obstructive sleep apnea) - Patient refusing CPAP    Atrial flutter - Maintaining NSR - Continue  Apixaban - Continue Coreg    Anemia of chronic disease -Stable    Gout - Great improvement on colcrys -Continue allopuriol, will need titration as outpatient.   Dispo: Disposition is deferred at this time, awaiting improvement of current medical problems.  Anticipated discharge in approximately 2 day(s).   The patient does have a current PCP Luisa Dago(Everett Moding, MD) and does need an [facility] hospital follow-up appointment after discharge.  The patient does not have transportation limitations that hinder transportation to clinic appointments.  .Services Needed at time of discharge: Y =  Yes, Blank = No PT:   OT:   RN:   Equipment:   Other:     LOS: 6 days   Gust Rung, DO 04/26/2014, 3:53 PM

## 2014-04-26 NOTE — Progress Notes (Signed)
Subjective: Leg swelling continues to improve. No SOB, no chest pain.   Objective: Vital signs in last 24 hours: Filed Vitals:   04/25/14 1730 04/25/14 2058 04/26/14 0500 04/26/14 0820  BP: 124/63 99/65 116/68 112/68  Pulse: 85 80 81 82  Temp:  98.8 F (37.1 C) 99.2 F (37.3 C)   TempSrc:  Oral Oral   Resp:  18 18   Height:      Weight:   141.976 kg (313 lb)   SpO2:  98% 97%    Weight change: -1.179 kg (-2 lb 9.6 oz) -15lb since admission   General: resting in bed, NAD HEENT: [place]/at no scleral icterus Cardiac: RRR, no rubs, murmurs or gallops Pulm: clear to auscultation bilaterally, no wheezes, rales, or rhonchi Abd: soft, nontender, nondistended, BS present Ext: warm and well perfused, 1+ pedal edema to mid-shin bilaterally Neuro: alert and oriented X3, cranial nerves II-XII grossly intact MSK: mild ttp medial mallelolus and talar region. Mild warmth, no significant swelling.  No restriction with range of motion or pain with ROM  Lab Results: Basic Metabolic Panel:  Recent Labs Lab 04/22/14 0539  04/25/14 0530 04/26/14 0252  NA 141  < > 143 140  K 4.4  < > 4.5 4.6  CL 102  < > 105 101  CO2 25  < > 23 26  GLUCOSE 105*  < > 112* 97  BUN 24*  < > 21 21  CREATININE 1.50*  < > 1.53* 1.61*  CALCIUM 9.1  < > 9.5 9.4  MG 2.3  --   --   --   < > = values in this interval not displayed. Liver Function Tests:  Recent Labs Lab 04/20/14 2138  AST 22  ALT 20  ALKPHOS 77  BILITOT 0.3  PROT 8.1  ALBUMIN 3.8   CBC:  Recent Labs Lab 04/21/14 0840 04/22/14 0539  04/24/14 0500 04/25/14 0530  WBC 12.1* 10.7*  < > 13.4* 12.9*  NEUTROABS 8.1* 6.4  --   --   --   HGB 11.1* 11.1*  < > 11.0* 11.3*  HCT 35.1* 36.0*  < > 35.3* 35.7*  MCV 86.0 88.9  < > 88.0 85.8  PLT 239 256  < > 289 325  < > = values in this interval not displayed. Cardiac Enzymes:  Recent Labs Lab 04/21/14 0249 04/21/14 0642 04/21/14 1415  TROPONINI <0.30 <0.30 <0.30   BNP:  Recent  Labs Lab 04/20/14 2138  PROBNP 1869.0*   Anemia Panel:  Recent Labs Lab 04/21/14 0303  VITAMINB12 343  FOLATE 12.9  FERRITIN 540*  TIBC 274  IRON 17*  RETICCTPCT 2.0   Urine Drug Screen: Drugs of Abuse     Component Value Date/Time   LABOPIA NONE DETECTED 04/21/2014 1237   Studies/Results: No results found. Medications:  Scheduled Meds: . allopurinol  100 mg Oral Daily  . apixaban  5 mg Oral BID  . carvedilol  25 mg Oral BID WC  . colchicine  0.6 mg Oral BID  . furosemide  80 mg Intravenous BID  . hydrALAZINE  50 mg Oral TID  . isosorbide mononitrate  60 mg Oral Daily  . potassium chloride SA  20 mEq Oral Daily  . simvastatin  40 mg Oral QHS  . sodium chloride  3 mL Intravenous Q12H  . spironolactone  25 mg Oral Daily   Continuous Infusions:   PRN Meds:.sodium chloride, acetaminophen, gi cocktail, ondansetron (ZOFRAN) IV, sodium chloride Assessment/Plan:  53 y.o PMH  of HLD, nonischemic chronic systolic heart failure (EF 25-30% while in Atrial Flutter), HTN, atrial flutter (s/p TEE DCCV 04/03/14), severe OSA, CKD presented with chest pain with exertion and acute on chronic CHF.  #Chest pain on exertion -cardiac catheterization showed no significant CAD. Chest pain likely secondary to volume overload. -continue medical management with home medications (Coreg 25 mg bid, Hydralazine 50 mg tid, Imdur 60, Spironolactone 25 mg qd)  #Acute on chronic systolic heart failure -negative 15lbs since admission. -Contine Lasix 80 mg IV BID, monitor creatinine -continue medical management with home medications (Coreg 25 mg bid, Hydralazine 50 mg tid, Imdur 60, Spironolactone 25 mg qd) -Strict intake and output, daily weights   #Atrial flutter -CHADSVASC 2. Currently in NSR at rest and with ambulation -Continue apixaban -continue Coreg  -D/c aspirin  #HYPERTENSION -controlled on home medications -continue to monitor  #acute on CKD (chronic kidney disease) stage 3 -BL  Creatinine 1.4-1.5. Creatinine stable at 1.6 -avoiding NSAIDs, ACEI  #normocytic anemia  -anemia panel resulted likely anemia of chronic disease with low Fe, saturation, elevated Ferritin   #OSA (obstructive sleep apnea) -cpap qhs   #Gout -?recent gout flare  -contniue colchicine -continue prn Norco -continue Allopurinol   #F/E/N -will trend electrolytes -cardiac diet   #DVT px  -Apixaban   Dispo: Disposition is deferred at this time, awaiting improvement of current medical problems.  Anticipated discharge in approximately 1 day(s).   The patient does have a current PCP Luisa Dago, MD) and does not need an [facility] hospital follow-up appointment after discharge.  The patient does not have transportation limitations that hinder transportation to clinic appointments.  .Services Needed at time of discharge: Y = Yes, Blank = No PT:   OT:   RN:   Equipment:   Other:     LOS: 6 days   Ginger Carne, Med Student [phone] 04/26/2014, 9:11 AM

## 2014-04-26 NOTE — Progress Notes (Signed)
RT asked the patient if he was going to try to wear the CPAP tonight and the patient stated he doesn't wear and will not wear. Patient asked RT to remove the CPAP from room at this time. RT will continue to monitor.

## 2014-04-27 DIAGNOSIS — N179 Acute kidney failure, unspecified: Secondary | ICD-10-CM

## 2014-04-27 DIAGNOSIS — L0591 Pilonidal cyst without abscess: Secondary | ICD-10-CM | POA: Diagnosis present

## 2014-04-27 LAB — BASIC METABOLIC PANEL
Anion gap: 13 (ref 5–15)
BUN: 22 mg/dL (ref 6–23)
CALCIUM: 9.2 mg/dL (ref 8.4–10.5)
CO2: 27 mEq/L (ref 19–32)
CREATININE: 1.7 mg/dL — AB (ref 0.50–1.35)
Chloride: 100 mEq/L (ref 96–112)
GFR, EST AFRICAN AMERICAN: 51 mL/min — AB (ref >90)
GFR, EST NON AFRICAN AMERICAN: 44 mL/min — AB (ref >90)
GLUCOSE: 97 mg/dL (ref 70–99)
POTASSIUM: 4.3 meq/L (ref 3.7–5.3)
Sodium: 140 mEq/L (ref 137–147)

## 2014-04-27 LAB — APTT: aPTT: 36 seconds (ref 24–37)

## 2014-04-27 MED ORDER — COLCHICINE 0.6 MG PO TABS: 0.6000 mg | ORAL_TABLET | Freq: Two times a day (BID) | ORAL | Status: DC

## 2014-04-27 MED ORDER — TORSEMIDE 20 MG PO TABS
60.0000 mg | ORAL_TABLET | Freq: Every day | ORAL | Status: DC
Start: 2014-04-28 — End: 2014-04-27
  Filled 2014-04-27: qty 3

## 2014-04-27 MED ORDER — TORSEMIDE 20 MG PO TABS
40.0000 mg | ORAL_TABLET | Freq: Every evening | ORAL | Status: DC
  Filled 2014-04-27: qty 2

## 2014-04-27 NOTE — Progress Notes (Signed)
  I have seen and examined the patient, and reviewed the daily progress note by Kerby Nora, MS 4 and discussed the care of the patient with them. Please see my progress note from 04/27/2014 for further details regarding assessment and plan.    Signed:  Gust Rung, DO 04/27/2014, 5:03 PM

## 2014-04-27 NOTE — Progress Notes (Signed)
Subjective: Leg swelling improved. No SOB, no chest pain.   Objective: Vital signs in last 24 hours: Filed Vitals:   04/26/14 1504 04/26/14 2020 04/27/14 0517 04/27/14 0845  BP: 104/56 110/44 112/64 120/78  Pulse: 86 82 80 80  Temp: 99 F (37.2 C) 98.2 F (36.8 C) 99.5 F (37.5 C)   TempSrc: Oral Oral Oral   Resp: 16 18 18    Height:      Weight:   140.161 kg (309 lb)   SpO2: 96% 97% 97%    Weight change: -1.814 kg (-4 lb) -18lb since admission   General: resting in bed, NAD HEENT: [place]/at no scleral icterus Cardiac: RRR, no rubs, murmurs or gallops Pulm: clear to auscultation bilaterally, no wheezes, rales, or rhonchi Abd: soft, nontender, nondistended, BS present Ext: warm and well perfused. No pedal edema  Neuro: alert and oriented X3, cranial nerves II-XII grossly intact MSK: mild ttp medial mallelolus and talar region. Mild warmth, no significant swelling.  No restriction with range of motion or pain with ROM  Lab Results: Basic Metabolic Panel:  Recent Labs Lab 04/22/14 0539  04/26/14 0252 04/27/14 0410  NA 141  < > 140 140  K 4.4  < > 4.6 4.3  CL 102  < > 101 100  CO2 25  < > 26 27  GLUCOSE 105*  < > 97 97  BUN 24*  < > 21 22  CREATININE 1.50*  < > 1.61* 1.70*  CALCIUM 9.1  < > 9.4 9.2  MG 2.3  --   --   --   < > = values in this interval not displayed. Liver Function Tests:  Recent Labs Lab 04/20/14 2138  AST 22  ALT 20  ALKPHOS 77  BILITOT 0.3  PROT 8.1  ALBUMIN 3.8   CBC:  Recent Labs Lab 04/21/14 0840 04/22/14 0539  04/24/14 0500 04/25/14 0530  WBC 12.1* 10.7*  < > 13.4* 12.9*  NEUTROABS 8.1* 6.4  --   --   --   HGB 11.1* 11.1*  < > 11.0* 11.3*  HCT 35.1* 36.0*  < > 35.3* 35.7*  MCV 86.0 88.9  < > 88.0 85.8  PLT 239 256  < > 289 325  < > = values in this interval not displayed. Cardiac Enzymes:  Recent Labs Lab 04/21/14 0249 04/21/14 0642 04/21/14 1415  TROPONINI <0.30 <0.30 <0.30   BNP:  Recent Labs Lab 04/20/14 2138    PROBNP 1869.0*   Anemia Panel:  Recent Labs Lab 04/21/14 0303  VITAMINB12 343  FOLATE 12.9  FERRITIN 540*  TIBC 274  IRON 17*  RETICCTPCT 2.0   Urine Drug Screen: Drugs of Abuse     Component Value Date/Time   LABOPIA NONE DETECTED 04/21/2014 1237   Studies/Results: No results found. Medications:  Scheduled Meds: . allopurinol  100 mg Oral Daily  . apixaban  5 mg Oral BID  . carvedilol  25 mg Oral BID WC  . colchicine  0.6 mg Oral BID  . hydrALAZINE  50 mg Oral TID  . isosorbide mononitrate  60 mg Oral Daily  . potassium chloride SA  20 mEq Oral Daily  . simvastatin  40 mg Oral QHS  . sodium chloride  3 mL Intravenous Q12H  . spironolactone  25 mg Oral Daily  . torsemide  40 mg Oral QPM  . [START ON 04/28/2014] torsemide  60 mg Oral Q breakfast   Continuous Infusions:   PRN Meds:.sodium chloride, acetaminophen, gi cocktail,  ondansetron (ZOFRAN) IV, sodium chloride Assessment/Plan:  53 y.o PMH of HLD, nonischemic chronic systolic heart failure (EF 25-30% while in Atrial Flutter), HTN, atrial flutter (s/p TEE DCCV 04/03/14), severe OSA, CKD presented with chest pain with exertion and acute on chronic CHF.  #Chest pain on exertion -cardiac catheterization showed no significant CAD. Chest pain likely secondary to volume overload. -continue medical management with home medications (Coreg 25 mg bid, Hydralazine 50 mg tid, Imdur 60, Spironolactone 25 mg qd)  #Acute on chronic systolic heart failure -negative 18lbs since admission. -D/c IV lasix for discharge -continue medical management with home medications (Coreg 25 mg bid, Hydralazine 50 mg tid, Imdur 60, Spironolactone 25 mg qd) -Strict intake and output, daily weights   #Atrial flutter -CHADSVASC 2. Currently in NSR at rest and with ambulation -Continue apixaban -continue Coreg   #HYPERTENSION -controlled on home medications -continue to monitor  #acute on CKD (chronic kidney disease) stage 3 -BL  Creatinine 1.4-1.5. Creatinine stable at 1.68 -avoiding NSAIDs, ACEI  #normocytic anemia  -anemia panel resulted likely anemia of chronic disease with low Fe, saturation, elevated Ferritin   #OSA (obstructive sleep apnea) -cpap qhs   #Gout -?recent gout flare  -contniue colchicine -continue prn Norco -continue Allopurinol   #F/E/N -will trend electrolytes -cardiac diet   #DVT px  -Apixaban   Dispo: Disposition is deferred at this time, awaiting improvement of current medical problems.  Anticipated discharge in approximately 1 day(s).   The patient does have a current PCP Luisa Dago, MD) and does not need an [facility] hospital follow-up appointment after discharge.  The patient does not have transportation limitations that hinder transportation to clinic appointments.  .Services Needed at time of discharge: Y = Yes, Blank = No PT:   OT:   RN:   Equipment:   Other:     LOS: 7 days   Ginger Carne, Med Student [phone] 04/27/2014, 12:39 PM

## 2014-04-27 NOTE — Progress Notes (Signed)
Patient ID: Eric Murillo, male   DOB: 24-Sep-1960, 53 y.o.   MRN: [id number] Medicine attending discharge note: I personally interviewed and examined this patient on the day of discharge together with medical resident Dr. Harmon Dun and fourth year medical student Ms. Kerby Nora. I concur with her evaluation and discharge plans. 53 year old man with hypertension, hyperlipidemia, gout, and chronic nonischemic cardiomyopathy, estimated ejection fraction 25-35%. chronic renal insufficiency. He has a history of paroxysmal atrial flutter. He was initially admitted on September  with increasing dyspnea and was found to be in atrial flutter. He ruled out for a acute myocardial infarction. He underwent successful DC cardioversion on September 11. He was readmitted to the hospital on September 28 with exertional chest pain and dyspnea. Once again, he ruled out for an acute myocardial infarction. He was in sinus rhythm at that time. He has been treated with parenteral diuretics and is back to his dry weight. He underwent cardiac catheterization on October 2. There was no significant coronary disease in any major artery. There was moderate pulmonary hypertension secondary to elevated left atrial pressure. He had a preserved cardiac with cardiac output estimated as 6.87 with cardiac index 2.69. Estimated ejection fraction not mentioned in the report. He tolerated the procedure well. He received additional diuresis over the weekend. Current weight 309 pounds compared with admission weight of 329 pounds. He is stable for discharge today. Per cardiology recommendation he will go home on torsemide 60 mg in the morning, 40 mg in the afternoon, potassium 20 mEq twice daily, hydralazine 50 mg 3 times daily, Imdur 60 mg daily, Coreg 25 mg twice a day, Spironolactone 25 mg daily. He will continue on chronic anticoagulation with apixiban 5 mg twice daily. He will continue his statin for hyperlipidemia and colchicine for his  gout. He will followup in the internal medicine clinic for his general medical problems and with Dr. [name] for cardiology.  Cephas Darby, MD, FACP  Hematology-Oncology/Internal Medicine

## 2014-04-27 NOTE — Progress Notes (Signed)
Subjective: Patient reports feeling better, able to walk entire length of unit with SOB or chest pain,  Still having some right ankle pain but that is improved. Objective: Vital signs in last 24 hours: Filed Vitals:   04/26/14 1504 04/26/14 2020 04/27/14 0517 04/27/14 0845  BP: 104/56 110/44 112/64 120/78  Pulse: 86 82 80 80  Temp: 99 F (37.2 C) 98.2 F (36.8 C) 99.5 F (37.5 C)   TempSrc: Oral Oral Oral   Resp: 16 18 18    Height:      Weight:   309 lb (140.161 kg)   SpO2: 96% 97% 97%    Weight change: -4 lb (-1.814 kg)  Intake/Output Summary (Last 24 hours) at 04/27/14 1243 Last data filed at 04/27/14 0900  Gross per 24 hour  Intake    360 ml  Output   1575 ml  Net  -1215 ml   General:sitting in chair Cardiac: RRR, no rubs, murmurs or gallops Pulm: CTAB Abd: soft, nontender, nondistended Ext: warm and well perfused, trace pedal edema  Lab Results: Basic Metabolic Panel:  Recent Labs Lab 04/22/14 0539  04/26/14 0252 04/27/14 0410  NA 141  < > 140 140  K 4.4  < > 4.6 4.3  CL 102  < > 101 100  CO2 25  < > 26 27  GLUCOSE 105*  < > 97 97  BUN 24*  < > 21 22  CREATININE 1.50*  < > 1.61* 1.70*  CALCIUM 9.1  < > 9.4 9.2  MG 2.3  --   --   --   < > = values in this interval not displayed. Liver Function Tests:  Recent Labs Lab 04/20/14 2138  AST 22  ALT 20  ALKPHOS 77  BILITOT 0.3  PROT 8.1  ALBUMIN 3.8   No results found for this basename: LIPASE, AMYLASE,  in the last 168 hours No results found for this basename: AMMONIA,  in the last 168 hours CBC:  Recent Labs Lab 04/21/14 0840 04/22/14 0539  04/24/14 0500 04/25/14 0530  WBC 12.1* 10.7*  < > 13.4* 12.9*  NEUTROABS 8.1* 6.4  --   --   --   HGB 11.1* 11.1*  < > 11.0* 11.3*  HCT 35.1* 36.0*  < > 35.3* 35.7*  MCV 86.0 88.9  < > 88.0 85.8  PLT 239 256  < > 289 325  < > = values in this interval not displayed. Cardiac Enzymes:  Recent Labs Lab 04/21/14 0249 04/21/14 0642 04/21/14 1415    TROPONINI <0.30 <0.30 <0.30   BNP:  Recent Labs Lab 04/20/14 2138  PROBNP 1869.0*   D-Dimer: No results found for this basename: DDIMER,  in the last 168 hours CBG: No results found for this basename: GLUCAP,  in the last 168 hours Hemoglobin A1C: No results found for this basename: HGBA1C,  in the last 168 hours Fasting Lipid Panel: No results found for this basename: CHOL, HDL, LDLCALC, TRIG, CHOLHDL, LDLDIRECT,  in the last 168 hours Thyroid Function Tests: No results found for this basename: TSH, T4TOTAL, FREET4, T3FREE, THYROIDAB,  in the last 168 hours Coagulation:  Recent Labs Lab 04/23/14 0604  LABPROT 16.5*  INR 1.33   Anemia Panel:  Recent Labs Lab 04/21/14 0303  VITAMINB12 343  FOLATE 12.9  FERRITIN 540*  TIBC 274  IRON 17*  RETICCTPCT 2.0   Urine Drug Screen: Drugs of Abuse     Component Value Date/Time   LABOPIA NONE DETECTED 04/21/2014  1237   COCAINSCRNUR NONE DETECTED 04/21/2014 1237   LABBENZ NONE DETECTED 04/21/2014 1237   AMPHETMU NONE DETECTED 04/21/2014 1237   THCU NONE DETECTED 04/21/2014 1237   LABBARB NONE DETECTED 04/21/2014 1237    Alcohol Level: No results found for this basename: ETH,  in the last 168 hours Urinalysis: No results found for this basename: COLORURINE, APPERANCEUR, LABSPEC, PHURINE, GLUCOSEU, HGBUR, BILIRUBINUR, KETONESUR, PROTEINUR, UROBILINOGEN, NITRITE, LEUKOCYTESUR,  in the last 168 hours   Micro Results: No results found for this or any previous visit (from the past 240 hour(s)). Studies/Results: No results found. Medications: I have reviewed the patient's current medications. Scheduled Meds: . allopurinol  100 mg Oral Daily  . apixaban  5 mg Oral BID  . carvedilol  25 mg Oral BID WC  . colchicine  0.6 mg Oral BID  . hydrALAZINE  50 mg Oral TID  . isosorbide mononitrate  60 mg Oral Daily  . potassium chloride SA  20 mEq Oral Daily  . simvastatin  40 mg Oral QHS  . sodium chloride  3 mL Intravenous Q12H  .  spironolactone  25 mg Oral Daily  . torsemide  40 mg Oral QPM  . [START ON 04/28/2014] torsemide  60 mg Oral Q breakfast   Continuous Infusions:  PRN Meds:.sodium chloride, acetaminophen, gi cocktail, ondansetron (ZOFRAN) IV, sodium chloride Assessment/Plan:   Acute on chronic systolic and diastolic heart failure, NYHA class 4 -Net neg 19 lbs since admission, symptoms of chest pain and SOB have resolved.  Cardiology is pleased with his diuresis and ready for his discharge today -Torsemide 60 qam/40 qpm, KCl 20 bid, hydralazine 50 mg tid, Imdur 60 daily, Coreg 25 mg bid, apixaban 5 mg bid, home statin dose, colchicine 0.6 daily, allopurinol 100 mg daily, spironolactone 25 mg daily.    HYPERTENSION - Well controlled on current meds    CKD (chronic kidney disease) stage 3, GFR 30-59 ml/min -Returned to baseline    Cardiomyopathy, nonischemic - No obstructive CAD noted on cath    OSA (obstructive sleep apnea) - Non complaint with CPAP in hospital    Atrial flutter - Maintaining NSR - Coreg -Apixaban for A/C    Gout - improved with colcrys  Dispo: Discharge home today  The patient does have a current PCP Luisa Dago, MD) and does need an [facility] hospital follow-up appointment after discharge.  The patient does have transportation limitations that hinder transportation to clinic appointments.  .Services Needed at time of discharge: Y = Yes, Blank = No PT:   OT:   RN:   Equipment:   Other:     LOS: 7 days   Gust Rung, DO 04/27/2014, 12:43 PM

## 2014-04-27 NOTE — Progress Notes (Addendum)
Patient ID: Eric Murillo, male   DOB: 1961/01/08, 53 y.o.   MRN: [id number]     SUBJECTIVE: Weight continues to fall.  Ankle hurts but is better and he was able to walk to the end of the hall today without much problem. Chest pain has resolved.   RHC/LHC:  RA mean 14  RV 63/14  PA 65/25, mean 42  PCWP Not able to obtain PCWP despite multiple wires, etc.  LV 101/24  AO 100/72  Oxygen saturations:  PA 61%  AO 94%  Cardiac Output (Fick) 6.87  Cardiac Index (Fick) 2.69  Coronary angiography: No significant CAD.    Scheduled Meds: . allopurinol  100 mg Oral Daily  . apixaban  5 mg Oral BID  . carvedilol  25 mg Oral BID WC  . colchicine  0.6 mg Oral BID  . furosemide  80 mg Intravenous BID  . hydrALAZINE  50 mg Oral TID  . isosorbide mononitrate  60 mg Oral Daily  . potassium chloride SA  20 mEq Oral Daily  . simvastatin  40 mg Oral QHS  . sodium chloride  3 mL Intravenous Q12H  . spironolactone  25 mg Oral Daily   Continuous Infusions:   PRN Meds:.sodium chloride, acetaminophen, gi cocktail, ondansetron (ZOFRAN) IV, sodium chloride    Filed Vitals:   04/26/14 1504 04/26/14 2020 04/27/14 0517 04/27/14 0845  BP: 104/56 110/44 112/64 120/78  Pulse: 86 82 80 80  Temp: 99 F (37.2 C) 98.2 F (36.8 C) 99.5 F (37.5 C)   TempSrc: Oral Oral Oral   Resp: 16 18 18    Height:      Weight:   309 lb (140.161 kg)   SpO2: 96% 97% 97%     Intake/Output Summary (Last 24 hours) at 04/27/14 1153 Last data filed at 04/27/14 0900  Gross per 24 hour  Intake    360 ml  Output   1575 ml  Net  -1215 ml    LABS: Basic Metabolic Panel:  Recent Labs  61/47/09 0252 04/27/14 0410  NA 140 140  K 4.6 4.3  CL 101 100  CO2 26 27  GLUCOSE 97 97  BUN 21 22  CREATININE 1.61* 1.70*  CALCIUM 9.4 9.2   Liver Function Tests: No results found for this basename: AST, ALT, ALKPHOS, BILITOT, PROT, ALBUMIN,  in the last 72 hours No results found for this basename: LIPASE, AMYLASE,  in  the last 72 hours CBC:  Recent Labs  04/25/14 0530  WBC 12.9*  HGB 11.3*  HCT 35.7*  MCV 85.8  PLT 325   Cardiac Enzymes: No results found for this basename: CKTOTAL, CKMB, CKMBINDEX, TROPONINI,  in the last 72 hours BNP: No components found with this basename: POCBNP,  D-Dimer: No results found for this basename: DDIMER,  in the last 72 hours Hemoglobin A1C: No results found for this basename: HGBA1C,  in the last 72 hours Fasting Lipid Panel: No results found for this basename: CHOL, HDL, LDLCALC, TRIG, CHOLHDL, LDLDIRECT,  in the last 72 hours Thyroid Function Tests: No results found for this basename: TSH, T4TOTAL, FREET3, T3FREE, THYROIDAB,  in the last 72 hours Anemia Panel: No results found for this basename: VITAMINB12, FOLATE, FERRITIN, TIBC, IRON, RETICCTPCT,  in the last 72 hours  RADIOLOGY: Dg Chest 2 View  04/20/2014   CLINICAL DATA:  Chest pain, shortness of breath  EXAM: CHEST  2 VIEW  COMPARISON:  Prior radiograph from 03/20/2014  FINDINGS: Cardiac silhouette is moderately enlarged.  Mediastinal silhouette within normal limits.  Lungs are mildly hypoinflated with elevation of the right hemidiaphragm, similar to prior. Mild central perihilar vascular congestion present. No pulmonary edema or pleural effusion. There is no focal infiltrate. No pneumothorax.  No acute osseus abnormality.  IMPRESSION: Stable cardiomegaly with no acute cardiopulmonary abnormality identified.   Electronically Signed   By: Rise MuBenjamin  McClintock M.D.   On: 04/20/2014 22:08    PHYSICAL EXAM General: NAD Neck: JVP 8 cm, no thyromegaly or thyroid nodule.  Lungs: Clear to auscultation bilaterally with normal respiratory effort. CV: Nondisplaced PMI.  Heart regular S1/S2, no S3, no murmur.  No edema. Abdomen: Soft, nontender, no hepatosplenomegaly, no distention.  Neurologic: Alert and oriented x 3.  Psych: Normal affect. Extremities: No clubbing or cyanosis.   TELEMETRY: Reviewed telemetry  pt in NSR  ASSESSMENT AND PLAN: 53 yo with history of chronic systolic CHF (nonischemic cardiomyopathy), CKD, and paroxysmal atrial flutter presented with exertional chest pain and acute on chronic systolic CHF.  1. Chest pain: Exertional chest pain for several days prior to admission.  I suspect that this was due to volume overload.  No significant CAD on cath.  2. Atrial flutter: Patient remains in NSR after DCCV on 9/11.   3. Acute on chronic systolic CHF: Last EF was 25-30%, global hypokinesis.  Admitted with significant volume overload.  He diuresed well, weight down 20 lbs and looks to be nearing euvolemia.   - Transition back to po torsemide. - Continue other cardiac meds.   4. CKD: Creatinine 1.7, slight increase.  5. Right ankle pain: Suspect gout.  High uric acid, on colchicine.  Ankle pain improved.  6. Disposition: I think that he can go home today.  Will need followup in CHF clinic next week with BMET.  Please discharge him on the following cardiac regimen: Torsemide 60 qam/40 qpm, KCl 20 bid, hydralazine 50 mg tid, Imdur 60 daily, Coreg 25 mg bid, apixaban 5 mg bid, home statin dose, colchicine 0.6 daily, allopurinol 100 mg daily, spironolactone 25 mg daily.   Marca AnconaDalton Embry Huss 04/27/2014 11:53 AM

## 2014-04-27 NOTE — Progress Notes (Signed)
Patient's CPAP was removed from the room on 04/25/2014 due to patient stating he wasn't going to wear it and to remove from room.  RT will continue to monitor.

## 2014-04-30 ENCOUNTER — Ambulatory Visit: Payer: Medicaid Other | Admitting: Internal Medicine

## 2014-05-04 ENCOUNTER — Ambulatory Visit (HOSPITAL_COMMUNITY)
Admit: 2014-05-04 | Discharge: 2014-05-04 | Disposition: A | Discharge status: Home / Self Care | Payer: Medicaid Other | Source: Ambulatory Visit | Attending: Cardiology | Admitting: Cardiology

## 2014-05-04 VITALS — BP 132/76 | HR 92 | Wt 314.0 lb

## 2014-05-04 DIAGNOSIS — I4892 Unspecified atrial flutter: Secondary | ICD-10-CM | POA: Diagnosis not present

## 2014-05-04 DIAGNOSIS — Z7901 Long term (current) use of anticoagulants: Secondary | ICD-10-CM | POA: Diagnosis not present

## 2014-05-04 DIAGNOSIS — Z79899 Other long term (current) drug therapy: Secondary | ICD-10-CM | POA: Diagnosis not present

## 2014-05-04 DIAGNOSIS — E669 Obesity, unspecified: Secondary | ICD-10-CM | POA: Diagnosis not present

## 2014-05-04 DIAGNOSIS — I5043 Acute on chronic combined systolic (congestive) and diastolic (congestive) heart failure: Secondary | ICD-10-CM

## 2014-05-04 DIAGNOSIS — I129 Hypertensive chronic kidney disease with stage 1 through stage 4 chronic kidney disease, or unspecified chronic kidney disease: Secondary | ICD-10-CM | POA: Diagnosis not present

## 2014-05-04 DIAGNOSIS — N189 Chronic kidney disease, unspecified: Secondary | ICD-10-CM | POA: Insufficient documentation

## 2014-05-04 DIAGNOSIS — I429 Cardiomyopathy, unspecified: Secondary | ICD-10-CM | POA: Diagnosis not present

## 2014-05-04 DIAGNOSIS — G4733 Obstructive sleep apnea (adult) (pediatric): Secondary | ICD-10-CM

## 2014-05-04 DIAGNOSIS — I5022 Chronic systolic (congestive) heart failure: Secondary | ICD-10-CM | POA: Diagnosis not present

## 2014-05-04 DIAGNOSIS — E785 Hyperlipidemia, unspecified: Secondary | ICD-10-CM | POA: Insufficient documentation

## 2014-05-04 DIAGNOSIS — Z6841 Body Mass Index (BMI) 40.0 and over, adult: Secondary | ICD-10-CM

## 2014-05-04 DIAGNOSIS — N183 Chronic kidney disease, stage 3 unspecified: Secondary | ICD-10-CM

## 2014-05-04 LAB — BASIC METABOLIC PANEL
Anion gap: 14 (ref 5–15)
BUN: 26 mg/dL — AB (ref 6–23)
CALCIUM: 9.8 mg/dL (ref 8.4–10.5)
CO2: 28 mEq/L (ref 19–32)
Chloride: 99 mEq/L (ref 96–112)
Creatinine, Ser: 1.76 mg/dL — ABNORMAL HIGH (ref 0.50–1.35)
GFR calc Af Amer: 49 mL/min — ABNORMAL LOW (ref >90)
GFR, EST NON AFRICAN AMERICAN: 42 mL/min — AB (ref >90)
Glucose, Bld: 118 mg/dL — ABNORMAL HIGH (ref 70–99)
Potassium: 4.7 mEq/L (ref 3.7–5.3)
Sodium: 141 mEq/L (ref 137–147)

## 2014-05-04 MED ORDER — ISOSORB DINITRATE-HYDRALAZINE 20-37.5 MG PO TABS: 2.0000 | ORAL_TABLET | Freq: Three times a day (TID) | ORAL | Status: DC

## 2014-05-04 NOTE — Patient Instructions (Signed)
STOP Isosorbide and Hydralazine START Bidil two tabs twice a day  Labs today   Your physician recommends that you schedule a follow-up appointment in: 1 month  Do the following things EVERYDAY: 1) Weigh yourself in the morning before breakfast. Write it down and keep it in a log. 2) Take your medicines as prescribed 3) Eat low salt foods-Limit salt (sodium) to 2000 mg per day.  4) Stay as active as you can everyday 5) Limit all fluids for the day to less than 2 liters 6)

## 2014-05-04 NOTE — Progress Notes (Signed)
Patient ID: [name], male   DOB: Feb 28, 1961, 53 y.o.   MRN: [id number] PCP: Dr. Glenard HaringModing  53 yo with history of obesity, hyperlipidemia, HTN, atrial flutter (s/p TEE DC-CV 04/03/14), NICM, chronic systolic HF and severe OSA but unable to tolerate CPAP.    Eric Murillo was cardioverted for atypical atrial flutter and remains in NSR today.  He was admitted in 10/15 with increased exertional dyspnea and chest tightness.  RHC/LHC showed elevated filling pressures, preserved cardiac output, and no significant coronary disease.  He was diuresed and discharged home.  Weight is down 12 lbs since last appointment.  He feels much better.  No chest tightness.  He has gout pain in his right ankle that is slowly improving.  He has mild dyspnea when walking up an incline, no problem on flat ground.    Labs (5/14): K 3.7, creatinine 1.5 Labs (12/14): K 4.2, creatinine 1.27, LDL 84, HDL 31, TSH normal Labs (3/15): K 4.6, creatinine 1.29 Labs (10/07/13) K 3.9 Creatinine 1.44 Labs (11/14/13) K 4.2, creatinine 1.48 Labs (12/05/13) K 4.8, creatinine 2.01  Labs (12/2013): K 4.3, creatinine 1.85, BUN 36, pro-BNP 884 Labs (7/15): K 3.5, creatinine 1.53 Labs (9/15): K 4, creatinine 1.9.   Labs (10/15): K 4.3, creatinine 1.7  Past Medical History: 1. Nonischemic cardiomyopathy: Echo (3/12) with EF 30-35% and mildly dilated LV, diffuse LV hypokinesis, moderate MR, PA systolic pressure 55 mmHg.  Left and right heart cath (3/12): No angiographic CAD; mean RA 20, PA 76/41, mean PCWP 38, CI 2.1.  ANA, SPEP, and HIV negative.  TSH normal.  Denies drug abuse, heavy ETOH intake.  No family history of cardiomyopathy.  Cardiomyopathy may be due to long-standing HTN.  Echo (6/13): EF 35-40%, moderate LV dilation, moderate to severe LAE, PA systolic pressure 52 mmHg.  Unable to fit in magnet for cardiac MRI.  Echo (8/14) with EF 45%, moderately dilated LV, mild LVH, normal RV, PA systolic pressure 49 mmHg. TEE (9/15) with EF 25-30% with  global hypokinesis, mild to moderate Eric, mildly decreased RV systolic function. LHC/RHC (10/15) with no significant coronary disease; mean RA 14, PA 65/25 mean 42, unable to obtain PCWP but LVEDP 24, CI 2.69.   2. ERECTILE DYSFUNCTION 3. HYPERTENSION  4. CKD 5. DYSLIPIDEMIA  6. Obesity 7. Hyperlipidemia 8. Gout 9. OSA: Severe on 6/13 sleep study.  10. Sialolithiasis 11. Atypical Atrial Flutter: TEE-DC-CV (04/03/14) which was successful;    Family History: No history of cardiomyopathy No history of cancer among first degree relatives father - died of MI 4(70s)  mother - HTN  Social History: Lives girlfriend.  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 tobacco - none alcohol - none drugs - none  ROS: All systems reviewed and negative except as per HPI.   Current Outpatient Prescriptions  Medication Sig Dispense Refill  . allopurinol (ZYLOPRIM) 100 MG tablet Take 1 tablet (100 mg total) by mouth daily.  30 tablet  2  . apixaban (ELIQUIS) 5 MG TABS tablet Take 5 mg by mouth 2 (two) times daily.      . carvedilol (COREG) 25 MG tablet Take 1 tablet (25 mg total) by mouth 2 (two) times daily with a meal.  60 tablet  6  . colchicine 0.6 MG tablet Take 1 tablet (0.6 mg total) by mouth 2 (two) times daily.  60 tablet  0  . potassium chloride SA (K-DUR,KLOR-CON) 20 MEQ tablet Take 1 tablet (20 mEq total) by mouth 2 (two) times daily.  60 tablet  3  . simvastatin (ZOCOR) 40 MG tablet Take 1 tablet (40 mg total) by mouth at bedtime.  30 tablet  6  . spironolactone (ALDACTONE) 25 MG tablet Take 1 tablet (25 mg total) by mouth daily.  30 tablet  6  . torsemide (DEMADEX) 20 MG tablet Take 40-60 mg by mouth 2 (two) times daily. Takes 3 tablets every morning and 2 tablets every evening.      . traMADol (ULTRAM) 50 MG tablet Take by mouth every 6 (six) hours as needed for moderate pain.      . isosorbide-hydrALAZINE (BIDIL) 20-37.5 MG per tablet Take 2 tablets by mouth 3 (three) times daily.   180 tablet  3   No current facility-administered medications for this encounter.    Filed Vitals:   05/04/14 0931  BP: 132/76  Pulse: 92  Weight: 314 lb (142.429 kg)  SpO2: 97%   General: NAD, obese.  Neck:  Thick, JVP difficult to assess d/t body habitus but does not appear elevated, no thyromegaly or thyroid nodule.  Lungs: Clear to auscultation bilaterally with normal respiratory effort. CV: Nondisplaced PMI.  Heart regular S1/S2, no murmur.  No carotid bruit.   Abdomen: Soft, nontender, no hepatosplenomegaly, mildly distention.  Neurologic: Alert and oriented x 3.  Psych: Normal affect. Extremities: No clubbing or cyanosis. Trace bilateral ankle edema.   Assessment/Plan:  Chronic Systolic heart failure: Nonischemic cardiomyopathy, likely related to HTN initially.  Echo in 8/14 with EF 45% but EF down to 25-30% on TEE while in atrial flutter (03/2014). This may be due to a component of tachycardia-mediated cardiomyopathy.  Recent admission with volume overload, now with NYHA class II symptoms and 12 lb weight loss.  He is not volume overloaded on exam.  - Continue torsemide 60 qam, 40 qpm.  - Continue Coreg 25 mg bid and spironolactone 25 mg daily.  - Stop hydral/Imdur and will start Bidil 2 tabs tid.  - Will repeat echo in 12/15 (3 months post-DCCV). - Reinforced the need and importance of daily weights, a low sodium diet, and fluid restriction (less than 2 L a day). Instructed to call the HF clinic if weight increases more than 3 lbs overnight or 5 lbs in a week. .  CKD: Creatinine stable at 1.7 when last checked, repeat today.   HYPERTENSION: BP controlled.   OSA: Not very compliant with CPAP. Discussed the need to wear as much as possible. He is interested in possibly switching to nasal pillows.  Obesity: Discussed again the need to lose weight and to watch his portion sizes. Encouraged the need to be as active as possible. Discussed looking into the Atkins diet or the [facility] Diet.  Atrial flutter: Atypical atrial flutter, paroxysmal in early September.  He is now back in NSR after DCCV.  CHADSVASC = 2 (HTN, CHF).  He was very symptomatic while in atrial flutter, some improvement back in NSR.  He has only had 1 documented episode.  He will be seeing EP: if the atrial flutter is ablatable, we could likely take him off anticoagulation eventually (he does not want to continue anticoagulation) though I am concerned that with his cardiomyopathy, weight, and OSA he is at considerable risk for future atrial fibrillation.   [name] 05/04/2014 4:01 PM    F/U 6 weeks [name] 05/04/2014

## 2014-05-05 NOTE — Addendum Note (Signed)
Encounter addended by: Revia Nghiem, CCT on: 05/05/2014  9:10 AM<BR>     Documentation filed: Charges VN

## 2014-05-06 ENCOUNTER — Ambulatory Visit: Payer: Medicaid Other | Admitting: Internal Medicine

## 2014-05-06 ENCOUNTER — Encounter: Payer: Self-pay | Admitting: Internal Medicine

## 2014-05-11 ENCOUNTER — Encounter: Payer: Self-pay | Admitting: Internal Medicine

## 2014-05-11 ENCOUNTER — Ambulatory Visit (INDEPENDENT_AMBULATORY_CARE_PROVIDER_SITE_OTHER): Payer: Medicaid Other | Admitting: Internal Medicine

## 2014-05-11 VITALS — BP 120/88 | HR 93 | Ht 69.5 in | Wt 318.8 lb

## 2014-05-11 DIAGNOSIS — I4892 Unspecified atrial flutter: Secondary | ICD-10-CM

## 2014-05-11 DIAGNOSIS — I5022 Chronic systolic (congestive) heart failure: Secondary | ICD-10-CM

## 2014-05-11 DIAGNOSIS — G4733 Obstructive sleep apnea (adult) (pediatric): Secondary | ICD-10-CM

## 2014-05-11 DIAGNOSIS — I10 Essential (primary) hypertension: Secondary | ICD-10-CM

## 2014-05-11 DIAGNOSIS — I429 Cardiomyopathy, unspecified: Secondary | ICD-10-CM

## 2014-05-11 DIAGNOSIS — Z6841 Body Mass Index (BMI) 40.0 and over, adult: Secondary | ICD-10-CM

## 2014-05-11 DIAGNOSIS — G4730 Sleep apnea, unspecified: Secondary | ICD-10-CM

## 2014-05-11 DIAGNOSIS — I428 Other cardiomyopathies: Secondary | ICD-10-CM

## 2014-05-11 NOTE — Patient Instructions (Signed)
Your physician recommends that you schedule a follow-up appointment as needed with Dr Johney Frame  You have been referred to Dr Theressa Millard for sleep apnea
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
## 2014-05-26 ENCOUNTER — Encounter (HOSPITAL_COMMUNITY): Payer: Medicaid Other

## 2014-05-28 ENCOUNTER — Encounter (HOSPITAL_COMMUNITY): Payer: Medicaid Other

## 2014-06-04 ENCOUNTER — Ambulatory Visit (HOSPITAL_COMMUNITY)
Admission: RE | Admit: 2014-06-04 | Discharge: 2014-06-04 | Disposition: A | Discharge status: Home / Self Care | Payer: Medicaid Other | Source: Ambulatory Visit | Attending: Internal Medicine | Admitting: Internal Medicine

## 2014-06-04 ENCOUNTER — Encounter (HOSPITAL_COMMUNITY): Payer: Self-pay

## 2014-06-04 ENCOUNTER — Telehealth (HOSPITAL_COMMUNITY): Payer: Self-pay | Admitting: *Deleted

## 2014-06-04 VITALS — BP 112/80 | HR 93 | Wt 318.0 lb

## 2014-06-04 DIAGNOSIS — I499 Cardiac arrhythmia, unspecified: Secondary | ICD-10-CM | POA: Diagnosis not present

## 2014-06-04 DIAGNOSIS — I4892 Unspecified atrial flutter: Secondary | ICD-10-CM

## 2014-06-04 DIAGNOSIS — I440 Atrioventricular block, first degree: Secondary | ICD-10-CM | POA: Diagnosis not present

## 2014-06-04 DIAGNOSIS — I429 Cardiomyopathy, unspecified: Secondary | ICD-10-CM | POA: Insufficient documentation

## 2014-06-04 DIAGNOSIS — I129 Hypertensive chronic kidney disease with stage 1 through stage 4 chronic kidney disease, or unspecified chronic kidney disease: Secondary | ICD-10-CM | POA: Diagnosis not present

## 2014-06-04 DIAGNOSIS — Z6841 Body Mass Index (BMI) 40.0 and over, adult: Secondary | ICD-10-CM | POA: Insufficient documentation

## 2014-06-04 DIAGNOSIS — I5022 Chronic systolic (congestive) heart failure: Secondary | ICD-10-CM

## 2014-06-04 DIAGNOSIS — N183 Chronic kidney disease, stage 3 unspecified: Secondary | ICD-10-CM

## 2014-06-04 DIAGNOSIS — E785 Hyperlipidemia, unspecified: Secondary | ICD-10-CM | POA: Insufficient documentation

## 2014-06-04 DIAGNOSIS — Z79899 Other long term (current) drug therapy: Secondary | ICD-10-CM | POA: Diagnosis not present

## 2014-06-04 DIAGNOSIS — G4733 Obstructive sleep apnea (adult) (pediatric): Secondary | ICD-10-CM | POA: Diagnosis not present

## 2014-06-04 DIAGNOSIS — Z7901 Long term (current) use of anticoagulants: Secondary | ICD-10-CM | POA: Diagnosis not present

## 2014-06-04 DIAGNOSIS — I4891 Unspecified atrial fibrillation: Secondary | ICD-10-CM | POA: Insufficient documentation

## 2014-06-04 LAB — BASIC METABOLIC PANEL
Anion gap: 14 (ref 5–15)
BUN: 19 mg/dL (ref 6–23)
CALCIUM: 9.4 mg/dL (ref 8.4–10.5)
CO2: 26 mEq/L (ref 19–32)
Chloride: 102 mEq/L (ref 96–112)
Creatinine, Ser: 2.19 mg/dL — ABNORMAL HIGH (ref 0.50–1.35)
GFR calc Af Amer: 38 mL/min — ABNORMAL LOW (ref >90)
GFR, EST NON AFRICAN AMERICAN: 33 mL/min — AB (ref >90)
GLUCOSE: 114 mg/dL — AB (ref 70–99)
Potassium: 4.4 mEq/L (ref 3.7–5.3)
Sodium: 142 mEq/L (ref 137–147)

## 2014-06-04 MED ORDER — TORSEMIDE 20 MG PO TABS: ORAL_TABLET | ORAL | Status: DC

## 2014-06-04 MED ORDER — TORSEMIDE 20 MG PO TABS: 60.0000 mg | ORAL_TABLET | Freq: Two times a day (BID) | ORAL | Status: DC

## 2014-06-04 MED ORDER — LISINOPRIL 2.5 MG PO TABS: 2.5000 mg | ORAL_TABLET | Freq: Every day | ORAL | Status: DC

## 2014-06-04 NOTE — Progress Notes (Signed)
Patient ID: Eric Murillo, male   DOB: [birth year], 53 y.o.   MRN: [id number] PCP: Dr. Glenard Haring  53 yo with history of obesity, hyperlipidemia, HTN, atrial flutter (s/p TEE DC-CV 04/03/14), NICM, chronic systolic HF and severe OSA but unable to tolerate CPAP.    Eric Murillo was cardioverted for atypical atrial flutter and remains in NSR today.  He was admitted in 10/15 with increased exertional dyspnea and chest tightness.  RHC/LHC showed elevated filling pressures, preserved cardiac output, and no significant coronary disease.  He was diuresed and discharged home.  Weight is up 4 lbs.  No chest tightness.  Gout pain is better.  He has mild dyspnea when walking up an incline, no problem on flat ground.  Not getting much exercise.  Taking all medications.   ECG: NSR, left axis deviation  Labs (5/14): K 3.7, creatinine 1.5 Labs (12/14): K 4.2, creatinine 1.27, LDL 84, HDL 31, TSH normal Labs (3/15): K 4.6, creatinine 1.29 Labs (10/07/13) K 3.9 Creatinine 1.44 Labs (11/14/13) K 4.2, creatinine 1.48 Labs (12/05/13) K 4.8, creatinine 2.01  Labs (12/2013): K 4.3, creatinine 1.85, BUN 36, pro-BNP 884 Labs (7/15): K 3.5, creatinine 1.53 Labs (9/15): K 4, creatinine 1.9.   Labs (10/15): K 4.3, creatinine 1.7 Labs (11/15): K 4.4, creatinine 2.19  Past Medical History: 1. Nonischemic cardiomyopathy: Echo (3/12) with EF 30-35% and mildly dilated LV, diffuse LV hypokinesis, moderate MR, PA systolic pressure 55 mmHg.  Left and right heart cath (3/12): No angiographic CAD; mean RA 20, PA 76/41, mean PCWP 38, CI 2.1.  ANA, SPEP, and HIV negative.  TSH normal.  Denies drug abuse, heavy ETOH intake.  No family history of cardiomyopathy.  Cardiomyopathy may be due to long-standing HTN.  Echo (6/13): EF 35-40%, moderate LV dilation, moderate to severe LAE, PA systolic pressure 52 mmHg.  Unable to fit in magnet for cardiac MRI.  Echo (8/14) with EF 45%, moderately dilated LV, mild LVH, normal RV, PA systolic pressure 49  mmHg. TEE (9/15) with EF 25-30% with global hypokinesis, mild to moderate Eric, mildly decreased RV systolic function. LHC/RHC (10/15) with no significant coronary disease; mean RA 14, PA 65/25 mean 42, unable to obtain PCWP but LVEDP 24, CI 2.69.   2. ERECTILE DYSFUNCTION 3. HYPERTENSION  4. CKD 5. DYSLIPIDEMIA  6. Obesity 7. Hyperlipidemia 8. Gout 9. OSA: Severe on 6/13 sleep study.  10. Sialolithiasis 11. Atypical Atrial Flutter: TEE-DC-CV (04/03/14) which was successful;    Family History: No history of cardiomyopathy No history of cancer among first degree relatives father - died of MI (37s)  mother - HTN  Social History: Lives girlfriend.  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 tobacco - none alcohol - none drugs - none  ROS: All systems reviewed and negative except as per HPI.   Current Outpatient Prescriptions  Medication Sig Dispense Refill  . allopurinol (ZYLOPRIM) 100 MG tablet Take 1 tablet (100 mg total) by mouth daily. 30 tablet 2  . apixaban (ELIQUIS) 5 MG TABS tablet Take 5 mg by mouth 2 (two) times daily.    . carvedilol (COREG) 25 MG tablet Take 1 tablet (25 mg total) by mouth 2 (two) times daily with a meal. 60 tablet 6  . colchicine 0.6 MG tablet Take 1 tablet (0.6 mg total) by mouth 2 (two) times daily. 60 tablet 0  . isosorbide-hydrALAZINE (BIDIL) 20-37.5 MG per tablet Take 2 tablets by mouth 3 (three) times daily. 180 tablet 3  . potassium chloride SA (  K-DUR,KLOR-CON) 20 MEQ tablet Take 1 tablet (20 mEq total) by mouth 2 (two) times daily. 60 tablet 3  . simvastatin (ZOCOR) 40 MG tablet Take 1 tablet (40 mg total) by mouth at bedtime. 30 tablet 6  . spironolactone (ALDACTONE) 25 MG tablet Take 1 tablet (25 mg total) by mouth daily. 30 tablet 6  . torsemide (DEMADEX) 20 MG tablet Take 3 tabs in AM and 2 tabs in PM 180 tablet 3   No current facility-administered medications for this encounter.    Filed Vitals:   06/04/14 0907  BP: 112/80   Pulse: 93  Weight: 318 lb (144.244 kg)  SpO2: 98%   General: NAD, obese.  Neck:  Thick, JVP 8 cm, no thyromegaly or thyroid nodule.  Lungs: Clear to auscultation bilaterally with normal respiratory effort. CV: Nondisplaced PMI.  Heart regular S1/S2, no murmur.  No carotid bruit.   Abdomen: Soft, nontender, no hepatosplenomegaly, mildly distention.  Neurologic: Alert and oriented x 3.  Psych: Normal affect. Extremities: No clubbing or cyanosis. 1+ edema 1/2 up lower legs bilaterally.   Assessment/Plan:  Chronic Systolic heart failure: Nonischemic cardiomyopathy, likely related to HTN initially.  Echo in 8/14 with EF 45% but EF down to 25-30% on TEE while in atrial flutter (03/2014). This may be due to a component of tachycardia-mediated cardiomyopathy.  Stable symptoms (NYHA II) but probably mild volume overload.  Unfortunately, creatinine is up to 2.19.  - Continue torsemide 60 qam, 40 qpm => will not increase.  Needs to watch sodium and fluid intake.  - Continue Coreg 25 mg bid and spironolactone 25 mg daily.  - Continue Bidil 2 tabs tid.  - No ACEI with elevated creatinine.  - Will repeat echo in 12/15 (3 months post-DCCV) => ?ICD. - Reinforced the need and importance of daily weights, a low sodium diet, and fluid restriction (less than 2 L a day). Instructed to call the HF clinic if weight increases more than 3 lbs overnight or 5 lbs in a week. .  CKD: Creatinine higher today, will repeat in 10-14 days to make sure it does not go up further.  HYPERTENSION: BP controlled.   OSA: Not very compliant with CPAP. Discussed the need to wear as much as possible.  Obesity: Discussed again the need to lose weight and to watch his portion sizes. He wants to join the [employer] to do water aerobics.  Atrial flutter: Atypical atrial flutter, paroxysmal in early September.  He is now back in NSR after DCCV.  CHADSVASC = 2 (HTN, CHF).  He was very symptomatic while in atrial flutter, some improvement back  in NSR.  He has only had 1 documented episode.  He saw EP, for now the plan is to manage without ablation.    Eric Murillo 06/04/2014

## 2014-06-04 NOTE — Telephone Encounter (Signed)
See labs 11/12 per Dr Shirlee Latch pt is not to start Lisinopril and should not increase torsemide, pt is aware and agreeable, repeat bmet 11/17

## 2014-06-04 NOTE — Patient Instructions (Addendum)
Labs today and again in 10 days (BMET)  Your physician recommends that you schedule a follow-up appointment in: 4-6 weeks with a echocardiogram  Your physician has requested that you have an echocardiogram. Echocardiography is a painless test that uses sound waves to create images of your heart. It provides your doctor with information about the size and shape of your heart and how well your heart's chambers and valves are working. This procedure takes approximately one hour. There are no restrictions for this procedure.  Do the following things EVERYDAY: 1) Weigh yourself in the morning before breakfast. Write it down and keep it in a log. 2) Take your medicines as prescribed 3) Eat low salt foods-Limit salt (sodium) to 2000 mg per day.  4) Stay as active as you can everyday 5) Limit all fluids for the day to less than 2 liters 6)

## 2014-06-05 NOTE — Addendum Note (Signed)
Encounter addended by: Deitra Mayo, CCT on: 06/05/2014  9:18 AM<BR>     Documentation filed: Charges VN

## 2014-06-09 ENCOUNTER — Other Ambulatory Visit (HOSPITAL_COMMUNITY): Payer: Medicaid Other

## 2014-06-10 ENCOUNTER — Emergency Department (HOSPITAL_COMMUNITY)
Admission: EM | Admit: 2014-06-10 | Discharge: 2014-06-10 | Disposition: A | Discharge status: Home / Self Care | Payer: Medicaid Other | Attending: Emergency Medicine | Admitting: Emergency Medicine

## 2014-06-10 ENCOUNTER — Encounter (HOSPITAL_COMMUNITY): Payer: Self-pay | Admitting: *Deleted

## 2014-06-10 DIAGNOSIS — Z79899 Other long term (current) drug therapy: Secondary | ICD-10-CM | POA: Insufficient documentation

## 2014-06-10 DIAGNOSIS — M25571 Pain in right ankle and joints of right foot: Secondary | ICD-10-CM | POA: Insufficient documentation

## 2014-06-10 DIAGNOSIS — N183 Chronic kidney disease, stage 3 (moderate): Secondary | ICD-10-CM | POA: Diagnosis not present

## 2014-06-10 DIAGNOSIS — I129 Hypertensive chronic kidney disease with stage 1 through stage 4 chronic kidney disease, or unspecified chronic kidney disease: Secondary | ICD-10-CM | POA: Insufficient documentation

## 2014-06-10 DIAGNOSIS — R2241 Localized swelling, mass and lump, right lower limb: Secondary | ICD-10-CM | POA: Diagnosis present

## 2014-06-10 DIAGNOSIS — I5043 Acute on chronic combined systolic (congestive) and diastolic (congestive) heart failure: Secondary | ICD-10-CM | POA: Diagnosis not present

## 2014-06-10 DIAGNOSIS — Z862 Personal history of diseases of the blood and blood-forming organs and certain disorders involving the immune mechanism: Secondary | ICD-10-CM | POA: Insufficient documentation

## 2014-06-10 DIAGNOSIS — Z7901 Long term (current) use of anticoagulants: Secondary | ICD-10-CM | POA: Insufficient documentation

## 2014-06-10 DIAGNOSIS — Z8669 Personal history of other diseases of the nervous system and sense organs: Secondary | ICD-10-CM | POA: Diagnosis not present

## 2014-06-10 DIAGNOSIS — Z8719 Personal history of other diseases of the digestive system: Secondary | ICD-10-CM | POA: Diagnosis not present

## 2014-06-10 DIAGNOSIS — Z872 Personal history of diseases of the skin and subcutaneous tissue: Secondary | ICD-10-CM | POA: Diagnosis not present

## 2014-06-10 DIAGNOSIS — E785 Hyperlipidemia, unspecified: Secondary | ICD-10-CM | POA: Insufficient documentation

## 2014-06-10 DIAGNOSIS — Z8739 Personal history of other diseases of the musculoskeletal system and connective tissue: Secondary | ICD-10-CM | POA: Insufficient documentation

## 2014-06-10 HISTORY — DX: Unspecified osteoarthritis, unspecified site: M19.90

## 2014-06-10 MED ORDER — TRAMADOL HCL 50 MG PO TABS: 50.0000 mg | ORAL_TABLET | Freq: Four times a day (QID) | ORAL | Status: DC | PRN

## 2014-06-10 NOTE — Discharge Instructions (Signed)
Please follow up closely with bone specialist for further evaluation of your right ankle pain if pain worsen.  Take pain medication as needed.    Ankle Pain Ankle pain is a common symptom. The bones, cartilage, tendons, and muscles of the ankle joint perform a lot of work each day. The ankle joint holds your body weight and allows you to move around. Ankle pain can occur on either side or back of 1 or both ankles. Ankle pain may be sharp and burning or dull and aching. There may be tenderness, stiffness, redness, or warmth around the ankle. The pain occurs more often when a person walks or puts pressure on the ankle. CAUSES  There are many reasons ankle pain can develop. It is important to work with your caregiver to identify the cause since many conditions can impact the bones, cartilage, muscles, and tendons. Causes for ankle pain include:  Injury, including a break (fracture), sprain, or strain often due to a fall, sports, or a high-impact activity.  Swelling (inflammation) of a tendon (tendonitis).  Achilles tendon rupture.  Ankle instability after repeated sprains and strains.  Poor foot alignment.  Pressure on a nerve (tarsal tunnel syndrome).  Arthritis in the ankle or the lining of the ankle.  Crystal formation in the ankle (gout or pseudogout). DIAGNOSIS  A diagnosis is based on your medical history, your symptoms, results of your physical exam, and results of diagnostic tests. Diagnostic tests may include X-ray exams or a computerized magnetic scan (magnetic resonance imaging, MRI). TREATMENT  Treatment will depend on the cause of your ankle pain and may include:  Keeping pressure off the ankle and limiting activities.  Using crutches or other walking support (a cane or brace).  Using rest, ice, compression, and elevation.  Participating in physical therapy or home exercises.  Wearing shoe inserts or special shoes.  Losing weight.  Taking medications to reduce pain or  swelling or receiving an injection.  Undergoing surgery. HOME CARE INSTRUCTIONS   Only take over-the-counter or prescription medicines for pain, discomfort, or fever as directed by your caregiver.  Put ice on the injured area.  Put ice in a plastic bag.  Place a towel between your skin and the bag.  Leave the ice on for 15-20 minutes at a time, 03-04 times a day.  Keep your leg raised (elevated) when possible to lessen swelling.  Avoid activities that cause ankle pain.  Follow specific exercises as directed by your caregiver.  Record how often you have ankle pain, the location of the pain, and what it feels like. This information may be helpful to you and your caregiver.  Ask your caregiver about returning to work or sports and whether you should drive.  Follow up with your caregiver for further examination, therapy, or testing as directed. SEEK MEDICAL CARE IF:   Pain or swelling continues or worsens beyond 1 week.  You have an oral temperature above 102 F (38.9 C).  You are feeling unwell or have chills.  You are having an increasingly difficult time with walking.  You have loss of sensation or other new symptoms.  You have questions or concerns. MAKE SURE YOU:   Understand these instructions.  Will watch your condition.  Will get help right away if you are not doing well or get worse. Document Released: 12/28/2009 Document Revised: 10/02/2011 Document Reviewed: 12/28/2009 [facility] Patient Information 2015 [place], [place]. This information is not intended to replace advice given to you by your health care provider.  Make sure you discuss any questions you have with your health care provider. ° °

## 2014-06-10 NOTE — ED Notes (Signed)
Pt reports having right foot pain and swelling x 3-4 days, denies injury. Pt thinks its due to arthritis or gout. No acute distress noted at triage.

## 2014-06-10 NOTE — ED Provider Notes (Signed)
CSN: [id number]     Arrival date & time 06/10/14  1519 History  This chart was scribed for non-physician practitioner, Fayrene HelperBowie Oluwademilade Kellett, PA-C working with Purvis SheffieldForrest Harrison, MD by Greggory StallionKayla Andersen, ED scribe. This patient was seen in room TR11C/TR11C and the patient's care was started at 3:45 PM.   Chief Complaint  Patient presents with  . Foot Swelling   The history is provided by the patient. No language interpreter was used.   HPI Comments: Eric Murillo is a 53 y.o. male who presents to the Emergency Department complaining of worsening, throbbing right ankle pain with associated swelling that started 3 days ago. Denies injury. Pt thinks it is due to his arthritis or gout. Bearing weight worsens the pain but resting it relieves some of it. He has taken Vicodin with little relief. Denies knee pain, hip pain. Pt does not have an orthopedist. Pt has similar ankle and foot pain like this in the past.  He uses crutches as needed.   Past Medical History  Diagnosis Date  . Hypertension   . Hyperlipidemia   . CHF (congestive heart failure)     EF 50-55%    . Organic erectile dysfunction   . Morbid obesity with BMI of 45.0-49.9, adult   . Gout     Of big toe  . CKD (chronic kidney disease) stage 3, GFR 30-59 ml/min   . Atrial flutter   . Chronic systolic heart failure   . Non-ischemic cardiomyopathy   . Acute on chronic systolic and diastolic heart failure, NYHA class 4   . OSA (obstructive sleep apnea)   . Submandibular sialolithiasis     Right  . Pilonidal cyst   . Dyslipidemia   . Anemia of chronic disease   . Chest pain   . Chest pain on exertion   . Hyperglycemia   . Arthritis    Past Surgical History  Procedure Laterality Date  . Colonoscopy    . Salivary gland surgery  09/12/2012  . Cardiac catheterization  2012  . Submandibular gland excision Right 09/12/2012    Procedure: Removal Right Submandibular Larina BrasStone;  Surgeon: Serena ColonelJefry Rosen, MD;  [facility];  Service: ENT;  Laterality:  Right;  . Tee without cardioversion N/A 04/03/2014    Procedure: TRANSESOPHAGEAL ECHOCARDIOGRAM (TEE);  Surgeon: Laurey Moralealton S McLean, MD;  [facility];  Service: Cardiovascular;  Laterality: N/A;  . Cardioversion N/A 04/03/2014    Procedure: CARDIOVERSION;  Surgeon: Laurey Moralealton S McLean, MD;  [facility];  Service: Cardiovascular;  Laterality: N/A;   Family History  Problem Relation Age of Onset  . Hypertension Mother   . Hypertension Father   . Cancer Father     brother died of brain cancer; sister died of bone cancer  . Diabetes Sister   . Diabetes Brother   . Colon cancer Maternal Aunt   . Cardiomyopathy Neg Hx    History  Substance Use Topics  . Smoking status: Never Smoker   . Smokeless tobacco: Never Used  . Alcohol Use: No    Review of Systems  Musculoskeletal: Positive for joint swelling and arthralgias.  All other systems reviewed and are negative.  Allergies  Beta adrenergic blockers  Home Medications   Prior to Admission medications   Medication Sig Start Date End Date Taking? Authorizing Provider  allopurinol (ZYLOPRIM) 100 MG tablet Take 1 tablet (100 mg total) by mouth daily. 03/20/14 03/20/15  Luisa DagoEverett Moding, MD  apixaban (ELIQUIS) 5 MG TABS tablet Take 5 mg by  mouth 2 (two) times daily.    Historical Provider, MD  carvedilol (COREG) 25 MG tablet Take 1 tablet (25 mg total) by mouth 2 (two) times daily with a meal. 01/05/14   Dolores Patty, MD  colchicine 0.6 MG tablet Take 1 tablet (0.6 mg total) by mouth 2 (two) times daily. 04/27/14   Gust Rung, DO  isosorbide-hydrALAZINE (BIDIL) 20-37.5 MG per tablet Take 2 tablets by mouth 3 (three) times daily. 05/04/14   Laurey Morale, MD  potassium chloride SA (K-DUR,KLOR-CON) 20 MEQ tablet Take 1 tablet (20 mEq total) by mouth 2 (two) times daily. 01/05/14   Dolores Patty, MD  simvastatin (ZOCOR) 40 MG tablet Take 1 tablet (40 mg total) by mouth at bedtime. 01/05/14   Dolores Patty, MD   spironolactone (ALDACTONE) 25 MG tablet Take 1 tablet (25 mg total) by mouth daily. 01/05/14   Dolores Patty, MD  torsemide (DEMADEX) 20 MG tablet Take 3 tabs in AM and 2 tabs in PM 06/04/14   Laurey Morale, MD   BP 130/72 mmHg  Pulse 83  Temp(Src) 98.5 F (36.9 C)  Resp 20  SpO2 97%   Physical Exam  Constitutional: He is oriented to person, place, and time. He appears well-developed and well-nourished. No distress.  HENT:  Head: Normocephalic and atraumatic.  Eyes: Conjunctivae and EOM are normal.  Neck: Neck supple. No tracheal deviation present.  Cardiovascular: Normal rate.   Pulmonary/Chest: Effort normal. No respiratory distress.  Musculoskeletal: Normal range of motion.  Right ankle pain with dorsiflexion and plantar flexion. Pain with eversion and inversion. Tenderness to right malleolar region without any rash or warmth.  Neurological: He is alert and oriented to person, place, and time.  Skin: Skin is warm and dry.  Psychiatric: He has a normal mood and affect. His behavior is normal.  Nursing note and vitals reviewed.   ED Course  Procedures (including critical care time)  DIAGNOSTIC STUDIES: Oxygen Saturation is 97% on RA, normal by my interpretation.    COORDINATION OF CARE: 3:48 PM-R ankle pain, acute on chronic.  Suspect arthralgias.  Doubt gout or infected joint.  pt is moderately obese.  Discussed treatment plan with pt at bedside and pt agreed to plan. Will give pt an orthopedic referral and advised him to follow up.   Labs Review Labs Reviewed - No data to display  Imaging Review No results found.   EKG Interpretation None      MDM   Final diagnoses:  Right ankle pain    BP 130/72 mmHg  Pulse 83  Temp(Src) 98.5 F (36.9 C)  Resp 20  SpO2 97%   I personally performed the services described in this documentation, which was scribed in my presence. The recorded information has been reviewed and is accurate.  Fayrene Helper,  PA-C 06/10/14 1555  Purvis Sheffield, MD 06/10/14 [phone]

## 2014-06-15 ENCOUNTER — Encounter (HOSPITAL_COMMUNITY): Payer: Self-pay | Admitting: Family Medicine

## 2014-06-15 ENCOUNTER — Emergency Department (HOSPITAL_COMMUNITY)
Admission: EM | Admit: 2014-06-15 | Discharge: 2014-06-15 | Disposition: A | Discharge status: Home / Self Care | Payer: Medicaid Other | Attending: Emergency Medicine | Admitting: Emergency Medicine

## 2014-06-15 ENCOUNTER — Emergency Department (HOSPITAL_COMMUNITY): Payer: Medicaid Other

## 2014-06-15 DIAGNOSIS — Z862 Personal history of diseases of the blood and blood-forming organs and certain disorders involving the immune mechanism: Secondary | ICD-10-CM | POA: Diagnosis not present

## 2014-06-15 DIAGNOSIS — I129 Hypertensive chronic kidney disease with stage 1 through stage 4 chronic kidney disease, or unspecified chronic kidney disease: Secondary | ICD-10-CM | POA: Insufficient documentation

## 2014-06-15 DIAGNOSIS — G4733 Obstructive sleep apnea (adult) (pediatric): Secondary | ICD-10-CM | POA: Diagnosis not present

## 2014-06-15 DIAGNOSIS — Z87438 Personal history of other diseases of male genital organs: Secondary | ICD-10-CM | POA: Diagnosis not present

## 2014-06-15 DIAGNOSIS — E785 Hyperlipidemia, unspecified: Secondary | ICD-10-CM | POA: Insufficient documentation

## 2014-06-15 DIAGNOSIS — M79605 Pain in left leg: Secondary | ICD-10-CM | POA: Diagnosis present

## 2014-06-15 DIAGNOSIS — Z8673 Personal history of transient ischemic attack (TIA), and cerebral infarction without residual deficits: Secondary | ICD-10-CM | POA: Insufficient documentation

## 2014-06-15 DIAGNOSIS — Z872 Personal history of diseases of the skin and subcutaneous tissue: Secondary | ICD-10-CM | POA: Diagnosis not present

## 2014-06-15 DIAGNOSIS — Z7902 Long term (current) use of antithrombotics/antiplatelets: Secondary | ICD-10-CM | POA: Diagnosis not present

## 2014-06-15 DIAGNOSIS — M109 Gout, unspecified: Secondary | ICD-10-CM | POA: Insufficient documentation

## 2014-06-15 DIAGNOSIS — M1990 Unspecified osteoarthritis, unspecified site: Secondary | ICD-10-CM | POA: Diagnosis not present

## 2014-06-15 DIAGNOSIS — N183 Chronic kidney disease, stage 3 (moderate): Secondary | ICD-10-CM | POA: Diagnosis not present

## 2014-06-15 DIAGNOSIS — Z79899 Other long term (current) drug therapy: Secondary | ICD-10-CM | POA: Insufficient documentation

## 2014-06-15 DIAGNOSIS — Z9889 Other specified postprocedural states: Secondary | ICD-10-CM | POA: Insufficient documentation

## 2014-06-15 DIAGNOSIS — I5043 Acute on chronic combined systolic (congestive) and diastolic (congestive) heart failure: Secondary | ICD-10-CM | POA: Diagnosis not present

## 2014-06-15 MED ORDER — OXYCODONE-ACETAMINOPHEN 5-325 MG PO TABS
2.0000 | ORAL_TABLET | Freq: Once | ORAL | Status: AC
  Administered 2014-06-15: 2 via ORAL
  Filled 2014-06-15: qty 2

## 2014-06-15 MED ORDER — OXYCODONE-ACETAMINOPHEN 10-325 MG PO TABS
1.0000 | ORAL_TABLET | Freq: Four times a day (QID) | ORAL | Status: DC | PRN
Start: 2014-06-15 — End: 2014-06-30

## 2014-06-15 NOTE — ED Provider Notes (Signed)
CSN: [id number]     Arrival date & time 06/15/14  1191 History   First MD Initiated Contact with Patient 06/15/14 1112     Chief Complaint  Patient presents with  . Leg Pain   HPI Eric Murillo is a 53 year old man with history of HTN, HLD, NICM, OSA, arthritis and gout presenting with bilateral ankle pain and left knee pain.  He was seen in the ER on 06/10/14 with right ankle pain and swelling of 3 days duration that was attributed to his gout and arthritis.  He was discharged with tramadol to help with the pain. Since that time, he reports worsening of the pain and developing pain in his left ankle and knee.  He has been weight-bearing exclusively on his left leg to avoid putting weight on his right ankle.  However, he currently reports being unable to stand up due to the pain in his ankles.  He denies new swelling in his ankles or knee.   Past Medical History  Diagnosis Date  . Hypertension   . Hyperlipidemia   . CHF (congestive heart failure)     EF 50-55%    . Organic erectile dysfunction   . Morbid obesity with BMI of 45.0-49.9, adult   . Gout     Of big toe  . CKD (chronic kidney disease) stage 3, GFR 30-59 ml/min   . Atrial flutter   . Chronic systolic heart failure   . Non-ischemic cardiomyopathy   . Acute on chronic systolic and diastolic heart failure, NYHA class 4   . OSA (obstructive sleep apnea)   . Submandibular sialolithiasis     Right  . Pilonidal cyst   . Dyslipidemia   . Anemia of chronic disease   . Chest pain   . Chest pain on exertion   . Hyperglycemia   . Arthritis    Past Surgical History  Procedure Laterality Date  . Colonoscopy    . Salivary gland surgery  09/12/2012  . Cardiac catheterization  2012  . Submandibular gland excision Right 09/12/2012    Procedure: Removal Right Submandibular Larina Bras;  Surgeon: Serena Colonel, MD;  Location: [facility] OR;  Service: ENT;  Laterality: Right;  . Tee without cardioversion N/A 04/03/2014    Procedure: TRANSESOPHAGEAL  ECHOCARDIOGRAM (TEE);  Surgeon: Laurey Morale, MD;  Location: [facility] ENDOSCOPY;  Service: Cardiovascular;  Laterality: N/A;  . Cardioversion N/A 04/03/2014    Procedure: CARDIOVERSION;  Surgeon: Laurey Morale, MD;  [facility];  Service: Cardiovascular;  Laterality: N/A;   Family History  Problem Relation Age of Onset  . Hypertension Mother   . Hypertension Father   . Cancer Father     brother died of brain cancer; sister died of bone cancer  . Diabetes Sister   . Diabetes Brother   . Colon cancer Maternal Aunt   . Cardiomyopathy Neg Hx    History  Substance Use Topics  . Smoking status: Never Smoker   . Smokeless tobacco: Never Used  . Alcohol Use: No    Review of Systems  Constitutional: Negative for fever and chills.  HENT: Negative for congestion, rhinorrhea and sore throat.   Respiratory: Negative for chest tightness, shortness of breath and wheezing.   Cardiovascular: Negative for chest pain and palpitations.  Gastrointestinal: Negative for nausea, vomiting, diarrhea, constipation and abdominal distention.  Genitourinary: Negative for difficulty urinating.  Musculoskeletal: Positive for myalgias, joint swelling and arthralgias.  Skin: Negative for rash.  Neurological: Negative for weakness, numbness  and headaches.      Allergies  Beta adrenergic blockers  Home Medications   Prior to Admission medications   Medication Sig Start Date End Date Taking? Authorizing Provider  allopurinol (ZYLOPRIM) 100 MG tablet Take 1 tablet (100 mg total) by mouth daily. 03/20/14 03/20/15 Yes Luisa DagoEverett Danney Bungert, MD  apixaban (ELIQUIS) 5 MG TABS tablet Take 5 mg by mouth 2 (two) times daily.   Yes Historical Provider, MD  carvedilol (COREG) 25 MG tablet Take 1 tablet (25 mg total) by mouth 2 (two) times daily with a meal. 01/05/14  Yes Dolores Pattyaniel R Bensimhon, MD  colchicine 0.6 MG tablet Take 1 tablet (0.6 mg total) by mouth 2 (two) times daily. 04/27/14  Yes Gust RungErik C Hoffman, DO   isosorbide-hydrALAZINE (BIDIL) 20-37.5 MG per tablet Take 2 tablets by mouth 3 (three) times daily. 05/04/14  Yes Laurey Moralealton S McLean, MD  potassium chloride SA (K-DUR,KLOR-CON) 20 MEQ tablet Take 1 tablet (20 mEq total) by mouth 2 (two) times daily. 01/05/14  Yes Dolores Pattyaniel R Bensimhon, MD  simvastatin (ZOCOR) 40 MG tablet Take 1 tablet (40 mg total) by mouth at bedtime. 01/05/14  Yes Dolores Pattyaniel R Bensimhon, MD  spironolactone (ALDACTONE) 25 MG tablet Take 1 tablet (25 mg total) by mouth daily. 01/05/14  Yes Dolores Pattyaniel R Bensimhon, MD  torsemide (DEMADEX) 20 MG tablet Take 3 tabs in AM and 2 tabs in PM Patient taking differently: Take 40-60 mg by mouth 2 (two) times daily. Takes 3 tablets every morning and 2 tablets every evening. 06/04/14  Yes Laurey Moralealton S McLean, MD  traMADol (ULTRAM) 50 MG tablet Take 1 tablet (50 mg total) by mouth every 6 (six) hours as needed. 06/10/14  Yes Fayrene HelperBowie Tran, PA-C  oxyCODONE-acetaminophen (PERCOCET) 10-325 MG per tablet Take 1 tablet by mouth every 6 (six) hours as needed for pain. 06/15/14   Hazem Kenner, MD   BP 105/63 mmHg  Pulse 84  Temp(Src) 98.5 F (36.9 C) (Oral)  Resp 20  SpO2 96% Physical Exam  Constitutional: He is oriented to person, place, and time. He appears well-developed and well-nourished. No distress.  HENT:  Head: Normocephalic and atraumatic.  Eyes: Conjunctivae and EOM are normal. Pupils are equal, round, and reactive to light.  Neck: Neck supple.  Cardiovascular: Normal rate, regular rhythm and normal heart sounds.   Pulmonary/Chest: Effort normal and breath sounds normal. No respiratory distress.  Abdominal: Soft. Bowel sounds are normal. He exhibits no distension. There is no tenderness.  Musculoskeletal: Normal range of motion. He exhibits edema (Mild swelling of ankles bilaterally and left knee with trace edema to shins.). He exhibits no tenderness.  Pain with weight bearing and flexion and extension of ankles bilaterally and left knee.   Neurological: He is alert and oriented to person, place, and time. No cranial nerve deficit. He exhibits normal muscle tone.  Skin: Skin is warm and dry. No rash noted. No erythema.    ED Course  Procedures (including critical care time) Labs Review Labs Reviewed - No data to display  Imaging Review Dg Ankle Complete Left  06/15/2014   CLINICAL DATA:  Pain and swelling LEFT foot and ankle for 1 week, no injury, history gout  EXAM: LEFT ANKLE COMPLETE - 3+ VIEW  COMPARISON:  09/25/2013  FINDINGS: Diffuse soft tissue swelling.  Osseous mineralization normal.  Ankle mortise intact.  Talonavicular degenerative changes with dorsal ossicle.  Calcaneal spurring.  No acute fracture, dislocation or bone destruction.  IMPRESSION: Talonavicular degenerative changes in calcaneal spurring.  Regional  soft tissue swelling LEFT ankle without acute osseous abnormalities.   Electronically Signed   By: Ulyses Southward M.D.   On: 06/15/2014 13:04   Dg Foot Complete Left  06/15/2014   CLINICAL DATA:  Left foot pain and swelling.  EXAM: LEFT FOOT - COMPLETE 3+ VIEW  COMPARISON:  None.  FINDINGS: Soft tissue swelling is seen involving the dorsal aspect of the midfoot and hindfoot. No evidence of fracture or dislocation. No evidence of joint space narrowing or other signs of arthropathy. Small dorsal and plantar calcaneal spurs are noted. Mild degenerative spurring is seen involving the ankle joint.  IMPRESSION: Dorsal mid and hindfoot soft tissue swelling. No evidence of fracture or other acute osseous abnormality.  Mild ankle DJD.   Electronically Signed   By: Myles Rosenthal M.D.   On: 06/15/2014 13:03     EKG Interpretation None      MDM   Final diagnoses:  Arthritis   Pain likely related to chronic arthritis and obesity with some contribution of underlying gout.  No signs of infection, no stress fracture or new injury on x-ray.  Will discharge home with stronger pain medications and have him follow up with  sports medicine for additional treatment.  Luisa Dago, MD 06/15/14 1329  Audree Camel, MD 06/18/14 [phone]

## 2014-06-15 NOTE — ED Notes (Signed)
Pt having bilateral foot pain radiating up legs and into knees.

## 2014-06-15 NOTE — Discharge Instructions (Signed)
-  Your pain is likely due to your arthritis and compensating for the the pain in your left ankle. -I wrote you a prescription for stronger pain medications to help with the pain for now, but you should follow up with orthopedics and sports medicine to determine if there is anything else they can do to help with the pain.

## 2014-06-16 ENCOUNTER — Other Ambulatory Visit (HOSPITAL_COMMUNITY): Payer: Medicaid Other

## 2014-06-24 ENCOUNTER — Ambulatory Visit: Payer: Medicaid Other | Admitting: Internal Medicine

## 2014-06-26 ENCOUNTER — Encounter: Payer: Self-pay | Admitting: Internal Medicine

## 2014-06-27 ENCOUNTER — Other Ambulatory Visit (HOSPITAL_COMMUNITY): Payer: Self-pay | Admitting: Anesthesiology

## 2014-06-29 ENCOUNTER — Ambulatory Visit (HOSPITAL_COMMUNITY)
Admission: RE | Admit: 2014-06-29 | Discharge: 2014-06-29 | Disposition: A | Discharge status: Home / Self Care | Payer: Medicaid Other | Source: Ambulatory Visit | Attending: Cardiology | Admitting: Cardiology

## 2014-06-29 DIAGNOSIS — I5043 Acute on chronic combined systolic (congestive) and diastolic (congestive) heart failure: Secondary | ICD-10-CM | POA: Insufficient documentation

## 2014-06-29 DIAGNOSIS — I5022 Chronic systolic (congestive) heart failure: Secondary | ICD-10-CM

## 2014-06-29 LAB — BASIC METABOLIC PANEL
ANION GAP: 15 (ref 5–15)
BUN: 28 mg/dL — ABNORMAL HIGH (ref 6–23)
CO2: 27 mEq/L (ref 19–32)
Calcium: 9.8 mg/dL (ref 8.4–10.5)
Chloride: 101 mEq/L (ref 96–112)
Creatinine, Ser: 1.96 mg/dL — ABNORMAL HIGH (ref 0.50–1.35)
GFR calc non Af Amer: 37 mL/min — ABNORMAL LOW (ref >90)
GFR, EST AFRICAN AMERICAN: 43 mL/min — AB (ref >90)
Glucose, Bld: 94 mg/dL (ref 70–99)
POTASSIUM: 4 meq/L (ref 3.7–5.3)
SODIUM: 143 meq/L (ref 137–147)

## 2014-06-30 ENCOUNTER — Ambulatory Visit (INDEPENDENT_AMBULATORY_CARE_PROVIDER_SITE_OTHER): Payer: Medicaid Other | Admitting: Internal Medicine

## 2014-06-30 ENCOUNTER — Encounter: Payer: Self-pay | Admitting: Internal Medicine

## 2014-06-30 VITALS — BP 144/78 | HR 84 | Temp 98.7°F | Ht 69.5 in | Wt 298.9 lb

## 2014-06-30 DIAGNOSIS — M1990 Unspecified osteoarthritis, unspecified site: Secondary | ICD-10-CM | POA: Insufficient documentation

## 2014-06-30 DIAGNOSIS — M25572 Pain in left ankle and joints of left foot: Secondary | ICD-10-CM

## 2014-06-30 MED ORDER — OXYCODONE-ACETAMINOPHEN 10-325 MG PO TABS: 1.0000 | ORAL_TABLET | Freq: Four times a day (QID) | ORAL | Status: DC | PRN

## 2014-06-30 NOTE — Assessment & Plan Note (Signed)
Patient w/ ongoing left ankle pain but also describes recent discomfort in his right ankle and left knee as well. History quite unclear with further questioning. Eric Murillo did present to the ED on 06/15/14 for left ankle pain and had foot/ankle XR only significant or mild soft-tissue swelling and DJD. 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 He was supposed to be taking Allopurinol 100 mg daily, but states that he just started taking this yesterday. Additionally, the patient denies any recent unprotected sex or even within the past 1-2 years. He also denies any recent penile discharge or urinary complaints. He denies any systemic symptoms suggestive of a more concerning condition; no nausea, vomiting, fever, chills, skin lesions or rash, change in vision, etc. After further discussion, it is likely that the patient is suffering from degenerative arthritis most likely related to his morbid obesity that has worsened in the recent past.  -Continue Colchicine 0.6 mg bid + Allopurinol 100 mg daily; will check uric acid today. If normal, can decrease dose of Colchicine to 0.6 mg daily. Unlikely to be gout flare at this time. -Continue Percocet 10-325 mg q6h prn for pain. Given Rx for #30 today. Patient will need to address further pain management at return visit with PCP.  -Has appointment scheduled next week with Dr. Lajoyce Corners according to the patient. Further workup for ligament damage or traumatic etiology can be evaluated if necessary at that time.  -Instructed patient to continue Warm water soak w/ epsom salt if helpful. Also advised heating pad and elevation at end of the day.  -Will return to clinic in 4 weeks for follow up and further evaluation + management of chronic conditions.

## 2014-06-30 NOTE — Progress Notes (Signed)
INTERNAL MEDICINE TEACHING ATTENDING ADDENDUM - Lorijean Husser, MD: I reviewed and discussed at the time of visit with the resident Dr. Jones, the patient's medical history, physical examination, diagnosis and results of pertinent tests and treatment and I agree with the patient's care as documented.  

## 2014-06-30 NOTE — Patient Instructions (Signed)
General Instructions:  1. Please schedule a follow up appointment for 4 weeks.  2. Please take all medications as prescribed.   Continue taking Percocet every 6 hours for pain ONLY IF NEEDED.  USE HEAT PACK ON ANKLE.  CONTINUE COLCHICINE + ALLOPURINOL.  Follow up with Dr. Lajoyce Corners.   3. If you have worsening of your symptoms or new symptoms arise, please call the clinic ([phone]), or go to the ER immediately if symptoms are severe.  You have done a great job in taking all your medications. I appreciate it very much. Please continue doing that   Please bring your medicines with you each time you come to clinic.  Medicines may include prescription medications, over-the-counter medications, herbal remedies, eye drops, vitamins, or other pills.   Progress Toward Treatment Goals:  Treatment Goal 06/30/2014  Blood pressure -  Prevent falls at goal    Self Care Goals & Plans:  Self Care Goal 06/30/2014  Manage my medications take my medicines as prescribed; bring my medications to every visit; refill my medications on time  Monitor my health -  Eat healthy foods drink diet soda or water instead of juice or soda; eat more vegetables; eat foods that are low in salt; eat baked foods instead of fried foods; eat fruit for snacks and desserts  Meeting treatment goals maintain the current self-care plan    No flowsheet data found.   Care Management & Community Referrals:  Referral 06/30/2014  Referrals made for care management support none needed  Referrals made to community resources none

## 2014-06-30 NOTE — Progress Notes (Signed)
Subjective:   Patient ID: Eric Murillo male   DOB: 09/18/1960 53 y.o.   MRN: [id number]  HPI: [name] is a 53 y.o. male w/ PMHx of HTN, HLD, Chronic sCHF, h/o Atrial Flutter (on Eliquis), OSA, CKD stage 3, Gout, and morbid obesity, presents to the clinic today for a follow-up visit after being seen in the ED on 06/15/14 for bilateral ankle pain and left knee pain. Per chart review and patient history, this was thought to be 2/2 osteoarthritis, also confirmed by X-ray of the left foot and ankle. Today, patient claims the pain has been polyarticular, originally starting in his right ankle, then moving to his left ankle and left knee. The patient does have a h/o Gout, however, he has been taking Colchicine 0.6 mg bid for quite some time. He was started on Allopurinol recently as well but says he started taking this yesterday. Today, his pain is most significant in the left ankle. He states Percocet has been helping intermittently. He denies any systemic symptoms such as fever, or chills, he denies rash or skin lesions. He also denies any recent sexual activity or unprotected sex. He states the pain is more significant with ambulation and is somewhat improved with warm water and epsom salts.   Past Medical History  Diagnosis Date  . Hypertension   . Hyperlipidemia   . CHF (congestive heart failure)     EF 50-55%    . Organic erectile dysfunction   . Morbid obesity with BMI of 45.0-49.9, adult   . Gout     Of big toe  . CKD (chronic kidney disease) stage 3, GFR 30-59 ml/min   . Atrial flutter   . Chronic systolic heart failure   . Non-ischemic cardiomyopathy   . Acute on chronic systolic and diastolic heart failure, NYHA class 4   . OSA (obstructive sleep apnea)   . Submandibular sialolithiasis     Right  . Pilonidal cyst   . Dyslipidemia   . Anemia of chronic disease   . Chest pain   . Chest pain on exertion   . Hyperglycemia   . Arthritis    Current Outpatient  Prescriptions  Medication Sig Dispense Refill  . allopurinol (ZYLOPRIM) 100 MG tablet Take 1 tablet (100 mg total) by mouth daily. 30 tablet 2  . apixaban (ELIQUIS) 5 MG TABS tablet Take 5 mg by mouth 2 (two) times daily.    . carvedilol (COREG) 25 MG tablet Take 1 tablet (25 mg total) by mouth 2 (two) times daily with a meal. 60 tablet 6  . colchicine 0.6 MG tablet Take 1 tablet (0.6 mg total) by mouth 2 (two) times daily. 60 tablet 0  . isosorbide-hydrALAZINE (BIDIL) 20-37.5 MG per tablet Take 2 tablets by mouth 3 (three) times daily. 180 tablet 3  . oxyCODONE-acetaminophen (PERCOCET) 10-325 MG per tablet Take 1 tablet by mouth every 6 (six) hours as needed for pain. 30 tablet 0  . potassium chloride SA (K-DUR,KLOR-CON) 20 MEQ tablet Take 1 tablet (20 mEq total) by mouth 2 (two) times daily. 60 tablet 3  . simvastatin (ZOCOR) 40 MG tablet Take 1 tablet (40 mg total) by mouth at bedtime. 30 tablet 6  . spironolactone (ALDACTONE) 25 MG tablet Take 1 tablet (25 mg total) by mouth daily. 30 tablet 6  . torsemide (DEMADEX) 20 MG tablet Take 3 tabs in AM and 2 tabs in PM (Patient taking differently: Take 40-60 mg by mouth 2 (two) times daily. Takes  3 tablets every morning and 2 tablets every evening.) 180 tablet 3  . torsemide (DEMADEX) 20 MG tablet TAKE 3 TABLETS EVERY MORNING AND 2 TABLETS IN THE EVENING 150 tablet 1  . traMADol (ULTRAM) 50 MG tablet Take 1 tablet (50 mg total) by mouth every 6 (six) hours as needed. 15 tablet 0   No current facility-administered medications for this visit.    Review of Systems: General: Denies fever, chills, diaphoresis, appetite change and fatigue.  Respiratory: Denies SOB, DOE, cough, and wheezing.   Cardiovascular: Denies chest pain and palpitations.  Gastrointestinal: Denies nausea, vomiting, abdominal pain, and diarrhea.  Genitourinary: Denies dysuria, increased frequency, and flank pain. Endocrine: Denies hot or cold intolerance, polyuria, and  polydipsia. Musculoskeletal: Positive for left ankle pain. Denies myalgias, back pain, joint swelling, and gait problem.  Skin: Denies pallor, rash and wounds.  Neurological: Denies dizziness, seizures, syncope, weakness, lightheadedness, numbness and headaches.  Psychiatric/Behavioral: Denies mood changes, and sleep disturbances.  Objective:   Physical Exam: Filed Vitals:   06/30/14 0931  BP: 144/78  Pulse: 84  Temp: 98.7 F (37.1 C)  TempSrc: Oral  Height: 5' 9.5" (1.765 m)  Weight: 298 lb 14.4 oz (135.58 kg)  SpO2: 100%    General: Morbidly obese AA male, alert, cooperative, NAD. HEENT: PERRL, EOMI. Moist mucus membranes Neck: Full range of motion without pain, supple, no lymphadenopathy or carotid bruits. Countless skin tags, mostly on the left side of the neck.  Lungs: Clear to ascultation bilaterally, normal work of respiration, no wheezes, rales, rhonchi Heart: RRR, no murmurs, gallops, or rubs Abdomen: Soft, non-tender, non-distended, BS + Extremities: No cyanosis, clubbing, or edema. Left ankle mildly tender to palpation and mild swelling. No erythema. Does not appear significantly different from right ankle.  Neurologic: Alert & oriented X3, cranial nerves II-XII intact, strength grossly intact, sensation intact to light touch   Assessment & Plan:   Please see problem based assessment and plan.

## 2014-07-01 LAB — CBC WITH DIFFERENTIAL/PLATELET
BASOS ABS: 0 10*3/uL (ref 0.0–0.1)
Basophils Relative: 0 % (ref 0–1)
EOS ABS: 0.2 10*3/uL (ref 0.0–0.7)
Eosinophils Relative: 2 % (ref 0–5)
HCT: 35.1 % — ABNORMAL LOW (ref 39.0–52.0)
Hemoglobin: 11.3 g/dL — ABNORMAL LOW (ref 13.0–17.0)
LYMPHS ABS: 3.6 10*3/uL (ref 0.7–4.0)
LYMPHS PCT: 38 % (ref 12–46)
MCH: 26.5 pg (ref 26.0–34.0)
MCHC: 32.2 g/dL (ref 30.0–36.0)
MCV: 82.2 fL (ref 78.0–100.0)
MPV: 10.5 fL (ref 9.4–12.4)
Monocytes Absolute: 1 10*3/uL (ref 0.1–1.0)
Monocytes Relative: 10 % (ref 3–12)
NEUTROS PCT: 50 % (ref 43–77)
Neutro Abs: 4.8 10*3/uL (ref 1.7–7.7)
PLATELETS: 318 10*3/uL (ref 150–400)
RBC: 4.27 MIL/uL (ref 4.22–5.81)
RDW: 15 % (ref 11.5–15.5)
WBC: 9.5 10*3/uL (ref 4.0–10.5)

## 2014-07-01 LAB — URIC ACID: URIC ACID, SERUM: 10.9 mg/dL — AB (ref 4.0–7.8)

## 2014-07-02 ENCOUNTER — Encounter (HOSPITAL_COMMUNITY): Payer: Self-pay | Admitting: Cardiology

## 2014-07-07 ENCOUNTER — Encounter: Payer: Self-pay | Admitting: *Deleted

## 2014-07-08 ENCOUNTER — Encounter (HOSPITAL_COMMUNITY): Payer: Medicaid Other

## 2014-07-08 ENCOUNTER — Ambulatory Visit (HOSPITAL_COMMUNITY): Payer: Medicaid Other

## 2014-07-25 ENCOUNTER — Emergency Department (HOSPITAL_COMMUNITY)
Admission: EM | Admit: 2014-07-25 | Discharge: 2014-07-25 | Disposition: A | Discharge status: Home / Self Care | Payer: Medicaid Other | Attending: Emergency Medicine | Admitting: Emergency Medicine

## 2014-07-25 ENCOUNTER — Encounter (HOSPITAL_COMMUNITY): Payer: Self-pay | Admitting: *Deleted

## 2014-07-25 DIAGNOSIS — I4892 Unspecified atrial flutter: Secondary | ICD-10-CM | POA: Diagnosis not present

## 2014-07-25 DIAGNOSIS — Z8719 Personal history of other diseases of the digestive system: Secondary | ICD-10-CM | POA: Diagnosis not present

## 2014-07-25 DIAGNOSIS — Z87438 Personal history of other diseases of male genital organs: Secondary | ICD-10-CM | POA: Insufficient documentation

## 2014-07-25 DIAGNOSIS — Z8669 Personal history of other diseases of the nervous system and sense organs: Secondary | ICD-10-CM | POA: Diagnosis not present

## 2014-07-25 DIAGNOSIS — Z7902 Long term (current) use of antithrombotics/antiplatelets: Secondary | ICD-10-CM | POA: Insufficient documentation

## 2014-07-25 DIAGNOSIS — M25572 Pain in left ankle and joints of left foot: Secondary | ICD-10-CM

## 2014-07-25 DIAGNOSIS — M1990 Unspecified osteoarthritis, unspecified site: Secondary | ICD-10-CM | POA: Diagnosis not present

## 2014-07-25 DIAGNOSIS — Z9889 Other specified postprocedural states: Secondary | ICD-10-CM | POA: Insufficient documentation

## 2014-07-25 DIAGNOSIS — I129 Hypertensive chronic kidney disease with stage 1 through stage 4 chronic kidney disease, or unspecified chronic kidney disease: Secondary | ICD-10-CM | POA: Insufficient documentation

## 2014-07-25 DIAGNOSIS — I5043 Acute on chronic combined systolic (congestive) and diastolic (congestive) heart failure: Secondary | ICD-10-CM | POA: Diagnosis not present

## 2014-07-25 DIAGNOSIS — Z872 Personal history of diseases of the skin and subcutaneous tissue: Secondary | ICD-10-CM | POA: Insufficient documentation

## 2014-07-25 DIAGNOSIS — M109 Gout, unspecified: Secondary | ICD-10-CM | POA: Diagnosis not present

## 2014-07-25 DIAGNOSIS — N183 Chronic kidney disease, stage 3 (moderate): Secondary | ICD-10-CM | POA: Insufficient documentation

## 2014-07-25 DIAGNOSIS — Z79899 Other long term (current) drug therapy: Secondary | ICD-10-CM | POA: Diagnosis not present

## 2014-07-25 DIAGNOSIS — E785 Hyperlipidemia, unspecified: Secondary | ICD-10-CM | POA: Diagnosis not present

## 2014-07-25 MED ORDER — ACETAMINOPHEN 500 MG PO TABS
1000.0000 mg | ORAL_TABLET | Freq: Once | ORAL | Status: AC
  Administered 2014-07-25: 1000 mg via ORAL
  Filled 2014-07-25: qty 2

## 2014-07-25 MED ORDER — OXYCODONE-ACETAMINOPHEN 10-325 MG PO TABS: 1.0000 | ORAL_TABLET | Freq: Four times a day (QID) | ORAL | Status: DC | PRN

## 2014-07-25 MED ORDER — KETOROLAC TROMETHAMINE 60 MG/2ML IM SOLN
60.0000 mg | Freq: Once | INTRAMUSCULAR | Status: DC
  Filled 2014-07-25: qty 2

## 2014-07-25 NOTE — ED Notes (Signed)
Declined W/C at D/C and was escorted to lobby by RN. 

## 2014-07-25 NOTE — ED Provider Notes (Signed)
CSN: [id number]     Arrival date & time 07/25/14  1028 History  This chart was scribed for non-physician practitioner, Joycie Peek, PA-C working with Gwyneth Sprout, MD, by Jarvis Morgan, ED Scribe. This patient was seen in room TR07C/TR07C and the patient's care was started at 11:10 AM.   Chief Complaint  Patient presents with  . Ankle Pain    The history is provided by the patient. No language interpreter was used.    HPI Comments: Eric Murillo is a 54 y.o. obese male with a h/o HTN, hyperlipidemia, CHF, CKD, and arthritis who presents to the Emergency Department complaining of constant, "throbbing and sharp", left ankle pain that began 1 day ago. Pt has a h/o of gout in bilateral ankles. He reports some associated swelling in his left ankle. He denies any injury to the ankle. Pt took 1 dose of Tylenol today with no relief. Pt states he has had pain like this in the past and was given a pain killer which provided relief but pt is unable to recall name of the medication. Pt had a cortisone shot 1 week ago in both ankles and notes some improvement for the pain from the injection.The pain is exacerbated by ambulation and applied pressure to the ankle. Denies any h/o DVT or DM. He denies any fever, chest pain, abdominal pain, or shortness of breath. Reports he is on Eliquis, but does not know why. Denies any unprotected sex, penile discharge, urinary symptoms.  Patient reports he has a follow-up appointment with his PCP on Tuesday. Reports this is a chronic issue that he has been seen by orthopedics and primary care for same complaint. They are evaluating gout versus arthritis and DJD.   Past Medical History  Diagnosis Date  . Hypertension   . Hyperlipidemia   . CHF (congestive heart failure)     EF 50-55%    . Organic erectile dysfunction   . Morbid obesity with BMI of 45.0-49.9, adult   . Gout     Of big toe  . CKD (chronic kidney disease) stage 3, GFR 30-59 ml/min   . Atrial  flutter   . Chronic systolic heart failure   . Non-ischemic cardiomyopathy   . Acute on chronic systolic and diastolic heart failure, NYHA class 4   . OSA (obstructive sleep apnea)   . Submandibular sialolithiasis     Right  . Pilonidal cyst   . Dyslipidemia   . Anemia of chronic disease   . Chest pain   . Chest pain on exertion   . Hyperglycemia   . Arthritis    Past Surgical History  Procedure Laterality Date  . Colonoscopy    . Salivary gland surgery  09/12/2012  . Cardiac catheterization  2012  . Submandibular gland excision Right 09/12/2012    Procedure: Removal Right Submandibular Larina Bras;  Surgeon: Serena Colonel, MD;  Location: [facility] OR;  Service: ENT;  Laterality: Right;  . Tee without cardioversion N/A 04/03/2014    Procedure: TRANSESOPHAGEAL ECHOCARDIOGRAM (TEE);  Surgeon: Laurey Morale, MD;  Location: [facility] ENDOSCOPY;  Service: Cardiovascular;  Laterality: N/A;  . Cardioversion N/A 04/03/2014    Procedure: CARDIOVERSION;  Surgeon: Laurey Morale, MD;  Location: [facility] ENDOSCOPY;  Service: Cardiovascular;  Laterality: N/A;  . Left and right heart catheterization with coronary angiogram N/A 04/24/2014    Procedure: LEFT AND RIGHT HEART CATHETERIZATION WITH CORONARY ANGIOGRAM;  Surgeon: Laurey Morale, MD;  Location: [facility] CATH LAB;  Service: Cardiovascular;  Laterality: N/A;  Family History  Problem Relation Age of Onset  . Hypertension Mother   . Hypertension Father   . Cancer Father     brother died of brain cancer; sister died of bone cancer  . Diabetes Sister   . Diabetes Brother   . Colon cancer Maternal Aunt   . Cardiomyopathy Neg Hx    History  Substance Use Topics  . Smoking status: Never Smoker   . Smokeless tobacco: Never Used  . Alcohol Use: No    Review of Systems  Constitutional: Negative for fever.  Respiratory: Negative for shortness of breath.   Cardiovascular: Negative for chest pain.  Gastrointestinal: Negative for abdominal pain.  Musculoskeletal:  Positive for joint swelling (L ankle) and arthralgias (L ankle).  All other systems reviewed and are negative.     Allergies  Beta adrenergic blockers  Home Medications   Prior to Admission medications   Medication Sig Start Date End Date Taking? Authorizing Provider  allopurinol (ZYLOPRIM) 100 MG tablet Take 1 tablet (100 mg total) by mouth daily. 03/20/14 03/20/15  Luisa Dago, MD  apixaban (ELIQUIS) 5 MG TABS tablet Take 5 mg by mouth 2 (two) times daily.    Historical Provider, MD  carvedilol (COREG) 25 MG tablet Take 1 tablet (25 mg total) by mouth 2 (two) times daily with a meal. 01/05/14   Dolores Patty, MD  colchicine 0.6 MG tablet Take 1 tablet (0.6 mg total) by mouth 2 (two) times daily. 04/27/14   Gust Rung, DO  isosorbide-hydrALAZINE (BIDIL) 20-37.5 MG per tablet Take 2 tablets by mouth 3 (three) times daily. 05/04/14   Laurey Morale, MD  oxyCODONE-acetaminophen (PERCOCET) 10-325 MG per tablet Take 1 tablet by mouth every 6 (six) hours as needed for pain. 07/25/14   Earle Gell [place] Minehart, PA-C  potassium chloride SA (K-DUR,KLOR-CON) 20 MEQ tablet Take 1 tablet (20 mEq total) by mouth 2 (two) times daily. 01/05/14   Dolores Patty, MD  simvastatin (ZOCOR) 40 MG tablet Take 1 tablet (40 mg total) by mouth at bedtime. 01/05/14   Dolores Patty, MD  spironolactone (ALDACTONE) 25 MG tablet Take 1 tablet (25 mg total) by mouth daily. 01/05/14   Dolores Patty, MD  torsemide (DEMADEX) 20 MG tablet Take 3 tabs in AM and 2 tabs in PM Patient taking differently: Take 40-60 mg by mouth 2 (two) times daily. Takes 3 tablets every morning and 2 tablets every evening. 06/04/14   Laurey Morale, MD  torsemide (DEMADEX) 20 MG tablet TAKE 3 TABLETS EVERY MORNING AND 2 TABLETS IN THE EVENING 06/29/14   Aundria Rud, NP  traMADol (ULTRAM) 50 MG tablet Take 1 tablet (50 mg total) by mouth every 6 (six) hours as needed. 06/10/14   Fayrene Helper, PA-C   Triage Vitals: BP 119/65 mmHg   Pulse 96  Temp(Src) 98.1 F (36.7 C) (Oral)  Resp 16  Ht 5' 9.5" (1.765 m)  Wt 298 lb (135.172 kg)  BMI 43.39 kg/m2  SpO2 95%   Physical Exam  Constitutional: He is oriented to person, place, and time. He appears well-developed and well-nourished. No distress.  Obese   HENT:  Head: Normocephalic and atraumatic.  Mouth/Throat: Oropharynx is clear and moist.  Eyes: Conjunctivae and EOM are normal. No scleral icterus.  Neck: Normal range of motion. Neck supple. No JVD present. No tracheal deviation present.  Cardiovascular: Normal rate, regular rhythm, normal heart sounds and intact distal pulses.   Pulmonary/Chest: Effort normal and breath  sounds normal. No respiratory distress.  Abdominal: Soft. He exhibits no distension.  Musculoskeletal: Normal range of motion.       Left ankle: He exhibits swelling (diffuse). Tenderness (diffuse tenderness).  Mild swelling with no pitting edema. No erythema or lesions on left ankle. NVI. Full ROM. Muscle bellies are soft with no tenderness in his calf or deep venous system.  Neurological: He is alert and oriented to person, place, and time.  Skin: Skin is warm and dry.  Psychiatric: He has a normal mood and affect. His behavior is normal.  Nursing note and vitals reviewed.   ED Course  Procedures (including critical care time)  DIAGNOSTIC STUDIES: Oxygen Saturation is 95% on RA, adequate by my interpretation.    COORDINATION OF CARE:    Labs Review Labs Reviewed - No data to display  Imaging Review No results found.   EKG Interpretation None     Meds given in ED:  Medications  acetaminophen (TYLENOL) tablet 1,000 mg (1,000 mg Oral Given 07/25/14 1157)    New Prescriptions   No medications on file   Filed Vitals:   07/25/14 1108  BP: 119/65  Pulse: 96  Temp: 98.1 F (36.7 C)  TempSrc: Oral  Resp: 16  Height: 5' 9.5" (1.765 m)  Weight: 298 lb (135.172 kg)  SpO2: 95%    MDM  Eric Murillo is a 54 y.o. male with  chronic left ankle pain who comes in for evaluation of acute left ankle pain. Patient reports he has a follow-up appointment with his PCP on Tuesday. Reports this is a chronic issue that he has been seen by orthopedics and primary care for same complaint. They are evaluating gout versus arthritis and DJD.  Vitals stable - WNL -afebrile Pt resting comfortably in ED. PE--mild swelling of left ankle with no bony tenderness, no erythema. Distal pulses intact neurovascularly intact. Muscle bellies are soft with no tenderness along the venous system. Benign lung exam   DDX--This appears to be a chronic pain problem with no evidence of an acute or emergent condition. Wells DVT negative. Patient likely with degenerative arthritis. Treated with Tylenol in the ED we'll discharge with pain medicine until he can meet with primary care on Tuesday. Low concern for PE, DVT, necrotizing fasciitis, other acute vascular insufficiencies.  Discussed f/u with PCP on Tuesday for regularly scheduled appointment and return precautions, pt very amenable to plan. Patient stable, in good condition and is appropriate for discharge   Final diagnoses:  Left ankle pain     I personally performed the services described in this documentation, which was scribed in my presence. The recorded information has been reviewed and is accurate.    Earle Gell [place], PA-C 07/25/14 1211  Gwyneth Sprout, MD 07/25/14 1350

## 2014-07-25 NOTE — Discharge Instructions (Signed)
Arthralgia °Your caregiver has diagnosed you as suffering from an arthralgia. Arthralgia means there is pain in a joint. This can come from many reasons including: °· Bruising the joint which causes soreness (inflammation) in the joint. °· Wear and tear on the joints which occur as we grow older (osteoarthritis). °· Overusing the joint. °· Various forms of arthritis. °· Infections of the joint. °Regardless of the cause of pain in your joint, most of these different pains respond to anti-inflammatory drugs and rest. The exception to this is when a joint is infected, and these cases are treated with antibiotics, if it is a bacterial infection. °HOME CARE INSTRUCTIONS  °· Rest the injured area for as long as directed by your caregiver. Then slowly start using the joint as directed by your caregiver and as the pain allows. Crutches as directed may be useful if the ankles, knees or hips are involved. If the knee was splinted or casted, continue use and care as directed. If an stretchy or elastic wrapping bandage has been applied today, it should be removed and re-applied every 3 to 4 hours. It should not be applied tightly, but firmly enough to keep swelling down. Watch toes and feet for swelling, bluish discoloration, coldness, numbness or excessive pain. If any of these problems (symptoms) occur, remove the ace bandage and re-apply more loosely. If these symptoms persist, contact your caregiver or return to this location. °· For the first 24 hours, keep the injured extremity elevated on pillows while lying down. °· Apply ice for 15-20 minutes to the sore joint every couple hours while awake for the first half day. Then 03-04 times per day for the first 48 hours. Put the ice in a plastic bag and place a towel between the bag of ice and your skin. °· Wear any splinting, casting, elastic bandage applications, or slings as instructed. °· Only take over-the-counter or prescription medicines for pain, discomfort, or fever as  directed by your caregiver. Do not use aspirin immediately after the injury unless instructed by your physician. Aspirin can cause increased bleeding and bruising of the tissues. °· If you were given crutches, continue to use them as instructed and do not resume weight bearing on the sore joint until instructed. °Persistent pain and inability to use the sore joint as directed for more than 2 to 3 days are warning signs indicating that you should see a caregiver for a follow-up visit as soon as possible. Initially, a hairline fracture (break in bone) may not be evident on X-rays. Persistent pain and swelling indicate that further evaluation, non-weight bearing or use of the joint (use of crutches or slings as instructed), or further X-rays are indicated. X-rays may sometimes not show a small fracture until a week or 10 days later. Make a follow-up appointment with your own caregiver or one to whom we have referred you. A radiologist (specialist in reading X-rays) may read your X-rays. Make sure you know how you are to obtain your X-ray results. Do not assume everything is normal if you do not hear from us. °SEEK MEDICAL CARE IF: °Bruising, swelling, or pain increases. °SEEK IMMEDIATE MEDICAL CARE IF:  °· Your fingers or toes are numb or blue. °· The pain is not responding to medications and continues to stay the same or get worse. °· The pain in your joint becomes severe. °· You develop a fever over 102° F (38.9° C). °· It becomes impossible to move or use the joint. °MAKE SURE YOU:  °·   Understand these instructions.  Will watch your condition.  Will get help right away if you are not doing well or get worse. Document Released: 07/10/2005 Document Revised: 10/02/2011 Document Reviewed: 02/26/2008 [facility] Patient Information 2015 [place], [place]. This information is not intended to replace advice given to you by your health care provider. Make sure you discuss any questions you have with your health care  provider.   It is important for you to follow-up with your primary care for further evaluation and management of your ankle pain. There is not appear to be an emergent cause for your symptoms at this time. Please take your pain medicine as directed for any discomfort he may experience.

## 2014-07-25 NOTE — ED Notes (Signed)
PT reports foot pain that started on Friday. Pt also reports swelling.

## 2014-07-27 ENCOUNTER — Telehealth (HOSPITAL_COMMUNITY): Payer: Self-pay | Admitting: Vascular Surgery

## 2014-07-27 ENCOUNTER — Other Ambulatory Visit (HOSPITAL_COMMUNITY): Payer: Self-pay

## 2014-07-27 MED ORDER — COLCHICINE 0.6 MG PO TABS: 0.6000 mg | ORAL_TABLET | Freq: Two times a day (BID) | ORAL | Status: DC

## 2014-07-27 NOTE — Telephone Encounter (Signed)
Attempted to call pt back, no answer and unable to leave VM

## 2014-07-27 NOTE — Telephone Encounter (Signed)
Pt left message with answering service, pt is in a lot of pain , he can not walk and he is out of medicine please advise

## 2014-07-28 ENCOUNTER — Encounter: Payer: Self-pay | Admitting: Internal Medicine

## 2014-07-28 ENCOUNTER — Ambulatory Visit (INDEPENDENT_AMBULATORY_CARE_PROVIDER_SITE_OTHER): Payer: Medicaid Other | Admitting: Internal Medicine

## 2014-07-28 VITALS — BP 124/75 | HR 96 | Temp 98.1°F

## 2014-07-28 DIAGNOSIS — I428 Other cardiomyopathies: Secondary | ICD-10-CM

## 2014-07-28 DIAGNOSIS — M25572 Pain in left ankle and joints of left foot: Secondary | ICD-10-CM

## 2014-07-28 DIAGNOSIS — I4892 Unspecified atrial flutter: Secondary | ICD-10-CM

## 2014-07-28 DIAGNOSIS — M109 Gout, unspecified: Secondary | ICD-10-CM

## 2014-07-28 DIAGNOSIS — I429 Cardiomyopathy, unspecified: Secondary | ICD-10-CM

## 2014-07-28 MED ORDER — ALLOPURINOL 100 MG PO TABS: 100.0000 mg | ORAL_TABLET | Freq: Two times a day (BID) | ORAL | Status: DC

## 2014-07-28 MED ORDER — OXYCODONE-ACETAMINOPHEN 10-325 MG PO TABS: 1.0000 | ORAL_TABLET | Freq: Four times a day (QID) | ORAL | Status: DC | PRN

## 2014-07-28 NOTE — Assessment & Plan Note (Signed)
No chest tightness or palpitation since DCCV.  Appears to be in sinus today. -Continue Eliquis at cardiology's recommendation.

## 2014-07-28 NOTE — Progress Notes (Signed)
   Subjective:    Patient ID: Eric Murillo, male    DOB: 07/21/1961, 54 y.o.   MRN: [id number]  HPI [name] is a 54 year old man with history of HTN, HLD, gout, CKD and nonischemic cardiomyopathy.  He was seen for bilateral ankle pain at the beginning of December. The ankle pain is worse in his left ankle.  He says he occasionally cannot walk as a result of the pain.  He has been taking Percocet 10-325 mg every 6 hours for his ankle pain, which allows him to bear some weight on his ankles and get around with crutches.  He was given 30 tablets by the [facility] at the beginning of December.  He went to the ER for the pain on 07/25/14, and they prescribed 10 tablets, which he says helps but he ran out yesterday.   He says the pain is different from his typical gout pain, which generally centers in his big toe.  He reports some swelling, but no warmth in the joints.  He went to see Dr. Lajoyce Corners of orthopedics 3 weeks ago who gave him cortisone shots in his left ankle and left knee, which may have helped a little bit.  He is scheduled to see Dr. Lajoyce Corners again this afternoon.  Dr. Lajoyce Corners told him to increase his allopurinol from 100 mg daily to 100 mg twice a day, which he has been doing.  He has also been taking colchicine 0.6 mg twice a day for about two months, but he ran out last week.  He requested a refill, but he has not picked it up from the pharmacy yet.  Review of Systems  Constitutional: Positive for activity change (Due to ankle pain.). Negative for fever, chills and fatigue.  HENT: Negative for congestion, rhinorrhea and sore throat.   Respiratory: Negative for chest tightness and shortness of breath.   Cardiovascular: Negative for chest pain and palpitations.  Gastrointestinal: Negative for nausea, vomiting, abdominal pain, diarrhea and constipation.  Genitourinary: Negative for dysuria and difficulty urinating.  Musculoskeletal: Positive for joint swelling and arthralgias. Negative for back pain.    Skin: Negative for rash.  Neurological: Negative for dizziness, weakness and numbness.       Objective:   Physical Exam  Constitutional: He is oriented to person, place, and time. He appears well-developed and well-nourished. No distress.  Obese.  Eyes: Conjunctivae and EOM are normal. Pupils are equal, round, and reactive to light. No scleral icterus.  Cardiovascular: Normal rate, regular rhythm and normal heart sounds.   Pulmonary/Chest: Effort normal and breath sounds normal. No respiratory distress.  Abdominal: Soft. Bowel sounds are normal. He exhibits no distension. There is no tenderness.  Musculoskeletal: Normal range of motion. He exhibits tenderness (With weight bearing in ankles and left knee. No pain with palpation.). He exhibits no edema.  No warmth or erythema surrounding joints. No evidence of effusion.  Neurological: He is alert and oriented to person, place, and time. No cranial nerve deficit. He exhibits normal muscle tone.  Skin: Skin is warm and dry. No erythema.  Skin tags on neck.          Assessment & Plan:  Please see problem-based assessment and plan.

## 2014-07-28 NOTE — Patient Instructions (Signed)
Thank you for coming to clinic today [name].  General instructions: -The best thing you can do for your ankle pain is lose weight. -Try to avoid fried foods as these could be contributing. -I wrote you a prescription for Percocet that should last you a month. -I would like you to come back in a month to discuss continuing to receive Percocet prescriptions from [place]. -You will need to stop going to the ER to get pain medications if we go down this route. -Make sure to see Dr. Lajoyce Corners today. -Please make a follow up appointment to return to clinic in 1 month.  Please bring your medicines with you each time you come.   Medicines may be  Eye drops  Herbal   Vitamins  Pills  Seeing these help [place] take care of you.

## 2014-07-28 NOTE — Assessment & Plan Note (Addendum)
Continues to follow with cardiology for his CHF and paroxysmal atrial flutter.  Denies heart failure symptoms today and does not have any edema or rales on exam.  Blood pressure well-controlled today. -Continue to follow with Dr. Jearld Pies, next appointment 07/30/14. -Continue Coreg 25 mg BID, Bidil 40-75 mg BID, spironolactone 25 mg daily, torsemide 60 mg in AM, 40 mg in PM per their recommendations. -Plan for echo at that appointment.

## 2014-07-28 NOTE — Telephone Encounter (Signed)
Pt was seen by pcp today and given percocet and allopurinol

## 2014-07-28 NOTE — Assessment & Plan Note (Addendum)
Pain in bilateral ankles and left knee.  Worst in left ankle.  This is most likely osteoarthritis due to his morbid obesity.  Some evidence of DJD on x-ray of his ankle in the ER and no pain with palpation, mostly with movement and weight-bearing.  Does have history of gout and uric acid was elevated at 10.9 at last visit on 06/30/14, so this could be contributing to some extent. Agree with increasing allopurinol to twice per day.  He did get some relief from steroid injections with Dr. Lajoyce Corners.  Losing weight is the best treatment for both OA and gout.  Unfortunately, not a good candidate for NSAIDs with CKD.  I think he is heading towards chronic opioid therapy. The goal will be to get him mobile enough to exercise to some extent, but most of weight loss will need to come from diet.  He reports eating lots of fried foods. -Encouraged patient to lose weight. -Referred to Lupita Leash for education on diet and lifestyle modification to help lose weight. -Percocet 10-325 mg q6h PRN #90 for one month. -Gave him pain contract to read and bring back to clinic next month. -Continue allopurinol 100 mg BID and colchicine 0.6 mg BID for gout prophylaxis (max 6 months of colchicine, 2 months so far). -Check uric acid at next appointment and consider stopping colchicine or increasing allopurinol if needed. -Follow up with Dr. Lajoyce Corners today.

## 2014-07-29 NOTE — Progress Notes (Signed)
Internal Medicine Clinic Attending  Case discussed with Dr. Glenard Haring at the time of the visit.  We reviewed the resident's history and exam and pertinent patient test results.  I agree with the assessment, diagnosis, and plan of care documented in the resident's note. When pt returns for F/U, assessment of improved function while on opioids needs to be assessed as it is function and not a pain score, that will determine if chronic opioids are appropriate.

## 2014-07-30 ENCOUNTER — Encounter (HOSPITAL_COMMUNITY): Payer: Medicaid Other

## 2014-07-30 ENCOUNTER — Ambulatory Visit (HOSPITAL_COMMUNITY): Payer: Medicaid Other

## 2014-08-04 ENCOUNTER — Telehealth: Payer: Self-pay | Admitting: *Deleted

## 2014-08-04 NOTE — Telephone Encounter (Signed)
-----   Message from Gerome Sam sent at 08/03/2014 12:41 PM EST ----- Regarding: APPOINTMENT  08/03/14 Tresa Endo, Mr.Deadwyler cancel his appointment with Dr.Osborne's office today.

## 2014-08-20 ENCOUNTER — Telehealth (HOSPITAL_COMMUNITY): Payer: Self-pay | Admitting: Vascular Surgery

## 2014-08-20 ENCOUNTER — Other Ambulatory Visit (HOSPITAL_COMMUNITY): Payer: Self-pay | Admitting: *Deleted

## 2014-08-20 MED ORDER — SIMVASTATIN 40 MG PO TABS: 40.0000 mg | ORAL_TABLET | Freq: Every day | ORAL | Status: DC

## 2014-08-20 NOTE — Telephone Encounter (Signed)
Refill sent in

## 2014-08-20 NOTE — Telephone Encounter (Signed)
Refill Simvastatin

## 2014-08-21 ENCOUNTER — Encounter (HOSPITAL_COMMUNITY): Payer: Self-pay

## 2014-08-21 ENCOUNTER — Ambulatory Visit (HOSPITAL_BASED_OUTPATIENT_CLINIC_OR_DEPARTMENT_OTHER)
Admission: RE | Admit: 2014-08-21 | Discharge: 2014-08-21 | Disposition: A | Discharge status: Home / Self Care | Payer: Medicaid Other | Source: Ambulatory Visit | Attending: Adult Health | Admitting: Adult Health

## 2014-08-21 ENCOUNTER — Ambulatory Visit (HOSPITAL_COMMUNITY)
Admission: RE | Admit: 2014-08-21 | Discharge: 2014-08-21 | Disposition: A | Discharge status: Home / Self Care | Payer: Medicaid Other | Source: Ambulatory Visit | Attending: Adult Health | Admitting: Adult Health

## 2014-08-21 DIAGNOSIS — I509 Heart failure, unspecified: Secondary | ICD-10-CM | POA: Insufficient documentation

## 2014-08-21 DIAGNOSIS — I5022 Chronic systolic (congestive) heart failure: Secondary | ICD-10-CM

## 2014-08-21 DIAGNOSIS — I10 Essential (primary) hypertension: Secondary | ICD-10-CM | POA: Insufficient documentation

## 2014-08-21 DIAGNOSIS — E785 Hyperlipidemia, unspecified: Secondary | ICD-10-CM | POA: Diagnosis not present

## 2014-08-21 LAB — BASIC METABOLIC PANEL
Anion gap: 4 — ABNORMAL LOW (ref 5–15)
BUN: 20 mg/dL (ref 6–23)
CO2: 33 mmol/L — AB (ref 19–32)
CREATININE: 1.78 mg/dL — AB (ref 0.50–1.35)
Calcium: 9.5 mg/dL (ref 8.4–10.5)
Chloride: 104 mmol/L (ref 96–112)
GFR calc Af Amer: 49 mL/min — ABNORMAL LOW (ref >90)
GFR, EST NON AFRICAN AMERICAN: 42 mL/min — AB (ref >90)
Glucose, Bld: 107 mg/dL — ABNORMAL HIGH (ref 70–99)
Potassium: 3.9 mmol/L (ref 3.5–5.1)
Sodium: 141 mmol/L (ref 135–145)

## 2014-08-21 LAB — BRAIN NATRIURETIC PEPTIDE: B NATRIURETIC PEPTIDE 5: 169.3 pg/mL — AB (ref 0.0–100.0)

## 2014-08-21 NOTE — Patient Instructions (Signed)
Labs today  You have been referred back to Dr Johney Frame to discuss a defibrillator   Your physician recommends that you schedule a follow-up appointment in: 6-8 weeks

## 2014-08-21 NOTE — Progress Notes (Signed)
*  PRELIMINARY RESULTS* Echocardiogram 2D Echocardiogram has been performed.  Jeryl Columbia 08/21/2014, 9:32 AM

## 2014-08-21 NOTE — Progress Notes (Signed)
Patient ID: Eric Murillo, male   DOB: 09-17-1960, 54 y.o.   MRN: [id number] PCP: Dr. Glenard Haring  54 yo with history of obesity, hyperlipidemia, HTN, atrial flutter (s/p TEE DC-CV 04/03/14), NICM, chronic systolic HF and severe OSA but unable to tolerate CPAP.     He was admitted in 10/205 with increased exertional dyspnea and chest tightness.  RHC/LHC showed elevated filling pressures, preserved cardiac output, and no significant coronary disease.  He was diuresed and discharged home.  Weight is up 4 lbs.    He returns for follow up. Overall feeling ok. Struggling with gout pain in his ankles. Had steroids about 1 week ago. Weight at home 298-301 pounds. Using CPAP rarely.  Following low diet. Taking all medications.    Labs (5/14): K 3.7, creatinine 1.5 Labs (12/14): K 4.2, creatinine 1.27, LDL 84, HDL 31, TSH normal Labs (3/15): K 4.6, creatinine 1.29 Labs (10/07/13) K 3.9 Creatinine 1.44 Labs (11/14/13) K 4.2, creatinine 1.48 Labs (12/05/13) K 4.8, creatinine 2.01  Labs (12/2013): K 4.3, creatinine 1.85, BUN 36, pro-BNP 884 Labs (7/15): K 3.5, creatinine 1.53 Labs (9/15): K 4, creatinine 1.9.   Labs (10/15): K 4.3, creatinine 1.7 Labs (11/15): K 4.4, creatinine 2.19 Lbas (06/29/2014) K 4.0 Creatinine 1.96   Past Medical History: 1. Nonischemic cardiomyopathy: Echo (3/12) with EF 30-35% and mildly dilated LV, diffuse LV hypokinesis, moderate MR, PA systolic pressure 55 mmHg.  Left and right heart cath (3/12): No angiographic CAD; mean RA 20, PA 76/41, mean PCWP 38, CI 2.1.  ANA, SPEP, and HIV negative.  TSH normal.  Denies drug abuse, heavy ETOH intake.  No family history of cardiomyopathy.  Cardiomyopathy may be due to long-standing HTN.  Echo (6/13): EF 35-40%, moderate LV dilation, moderate to severe LAE, PA systolic pressure 52 mmHg.  Unable to fit in magnet for cardiac MRI.  Echo (8/14) with EF 45%, moderately dilated LV, mild LVH, normal RV, PA systolic pressure 49 mmHg. TEE (9/15) with EF  25-30% with global hypokinesis, mild to moderate MR, mildly decreased RV systolic function. LHC/RHC (10/15) with no significant coronary disease; mean RA 14, PA 65/25 mean 42, unable to obtain PCWP but LVEDP 24, CI 2.69.   2. ERECTILE DYSFUNCTION 3. HYPERTENSION  4. CKD 5. DYSLIPIDEMIA  6. Obesity 7. Hyperlipidemia 8. Gout 9. OSA: Severe on 6/13 sleep study.  10. Sialolithiasis 11. Atypical Atrial Flutter: TEE-DC-CV (04/03/14) which was successful;    Family History: No history of cardiomyopathy No history of cancer among first degree relatives father - died of MI (35s)  mother - HTN  Social History: Lives girlfriend.  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 tobacco - none alcohol - none drugs - none  ROS: All systems reviewed and negative except as per HPI.   Current Outpatient Prescriptions  Medication Sig Dispense Refill  . allopurinol (ZYLOPRIM) 100 MG tablet Take 1 tablet (100 mg total) by mouth 2 (two) times daily. 60 tablet 2  . apixaban (ELIQUIS) 5 MG TABS tablet Take 5 mg by mouth 2 (two) times daily.    . carvedilol (COREG) 25 MG tablet Take 1 tablet (25 mg total) by mouth 2 (two) times daily with a meal. 60 tablet 6  . colchicine 0.6 MG tablet Take 1 tablet (0.6 mg total) by mouth 2 (two) times daily. 60 tablet 0  . isosorbide-hydrALAZINE (BIDIL) 20-37.5 MG per tablet Take 2 tablets by mouth 3 (three) times daily. 180 tablet 3  . oxyCODONE-acetaminophen (PERCOCET) 10-325 MG per  tablet Take 1 tablet by mouth every 6 (six) hours as needed for pain. 90 tablet 0  . potassium chloride SA (K-DUR,KLOR-CON) 20 MEQ tablet Take 1 tablet (20 mEq total) by mouth 2 (two) times daily. 60 tablet 3  . simvastatin (ZOCOR) 40 MG tablet Take 1 tablet (40 mg total) by mouth at bedtime. 30 tablet 3  . spironolactone (ALDACTONE) 25 MG tablet Take 1 tablet (25 mg total) by mouth daily. 30 tablet 6  . torsemide (DEMADEX) 20 MG tablet TAKE 3 TABLETS EVERY MORNING AND 2 TABLETS IN THE  EVENING 150 tablet 1  . traMADol (ULTRAM) 50 MG tablet Take 1 tablet (50 mg total) by mouth every 6 (six) hours as needed. 15 tablet 0   No current facility-administered medications for this encounter.    Filed Vitals:   08/21/14 1001  BP: 124/72  Pulse: 1  Weight: 302 lb (136.986 kg)  SpO2: 97%   General: NAD, obese.  Neck:  Thick, JVP does not appear elevated. no thyromegaly or thyroid nodule.  Lungs: Clear to auscultation bilaterally with normal respiratory effort. CV: Nondisplaced PMI.  Heart regular S1/S2, no murmur.  No carotid bruit.   Abdomen: Soft, nontender, no hepatosplenomegaly, mildly distention.  Neurologic: Alert and oriented x 3.  Psych: Normal affect. Extremities: No clubbing or cyanosis. Trace lower extremity edema  ECG: NSR 86 with 1AVB (216 ms) IVCD 108 LAFB No ST-T wave abnormalities.    Assessment/Plan:  Chronic Systolic heart failure: Nonischemic cardiomyopathy, likely related to HTN initially.  Echo in 8/14 with EF 45% but EF down to 25-30% on TEE while in atrial flutter (03/2014). Today ECHO with EF ~25%. No cardiac MRI given creatinine 1.96.  Refer to EP for ICD.   Stable symptoms (NYHA II). Volume status stable. Continue torsemide 60 qam, 40 qpm - Continue Coreg 25 mg bid and spironolactone 25 mg daily.  - Continue Bidil 2 tabs tid.   - No ACEI with elevated creatinine.  - Reinforced the need and importance of daily weights, a low sodium diet, and fluid restriction (less than 2 L a day). Instructed to call the HF clinic if weight increases more than 3 lbs overnight or 5 lbs in a week. .  CKD: Check BMET today  HYPERTENSION: Stable. BP controlled.   OSA: Not very compliant with CPAP. Discussed the need to wear as much as possible.  Obesity: Discussed again the need to lose weight and to watch his portion sizes.  Atrial flutter: Atypical atrial flutter, paroxysmal in early September.  He is now back in NSR after DCCV.  CHADSVASC = 2 (HTN, CHF). Continue  eliquis  At 5 mg twice a day. Gout: Per PCP  Follow up in 6-8 weeks.    CLEGG,AMY NP-C  08/21/2014  Patient seen and examined with Tonye Becket, NP. We discussed all aspects of the encounter. I agree with the assessment and plan as stated above.   Echo reviewed personally. EF ~25-30% range. ? Mild LV trabeculations. Currently NYHA II. Volume status ok. On good meds.  Discussed need for ICD. Will refer for ICD. Consider corlanor.

## 2014-08-23 NOTE — Addendum Note (Signed)
Encounter addended by: Deitra Mayo, CCT on: 08/23/2014 10:19 AM<BR>     Documentation filed: Charges VN

## 2014-08-24 ENCOUNTER — Ambulatory Visit (INDEPENDENT_AMBULATORY_CARE_PROVIDER_SITE_OTHER): Payer: Medicaid Other | Admitting: Internal Medicine

## 2014-08-24 ENCOUNTER — Encounter: Payer: Self-pay | Admitting: Internal Medicine

## 2014-08-24 VITALS — BP 124/80 | HR 71 | Ht 69.5 in | Wt 309.0 lb

## 2014-08-24 DIAGNOSIS — I429 Cardiomyopathy, unspecified: Secondary | ICD-10-CM

## 2014-08-24 DIAGNOSIS — I428 Other cardiomyopathies: Secondary | ICD-10-CM

## 2014-08-24 DIAGNOSIS — I5022 Chronic systolic (congestive) heart failure: Secondary | ICD-10-CM

## 2014-08-24 MED ORDER — PREDNISONE (PAK) 10 MG PO TABS: ORAL_TABLET | Freq: Every day | ORAL | Status: DC

## 2014-08-24 NOTE — Patient Instructions (Addendum)
Your physician has recommended you make the following change in your medication:  1) Take 6 day Prednisone dose pack  Your physician has requested that you have a lexiscan myoview. For further information please visit [email or web address] Please follow instruction sheet, as given.  Your physician has requested that you have an echocardiogram in 2 months. Echocardiography is a painless test that uses sound waves to create images of your heart. It provides your doctor with information about the size and shape of your heart and how well your heart's chambers and valves are working. This procedure takes approximately one hour. There are no restrictions for this procedure.  Your physician recommends that you schedule a follow-up appointment in: 2 months with Dr. Graciela Husbands.

## 2014-08-24 NOTE — Progress Notes (Signed)
ELECTROPHYSIOLOGY CONSULT NOTE  Patient ID: Eric Murillo, MRN: [id number], DOB/AGE: 10/22/1960 54 y.o. Admit date: (Not on file) Date of Consult: 08/24/2014  Primary Physician: Luisa Dago, MD Primary Cardiologist :CHF  Chief Complaint: ICD   HPI [name] is a 54 y.o. male  Referred for consideration of ICD implantation.  He has a nonischemic cardiomyopathy.  He has had a catheterization demonstrating no obstructive coronary disease. This was originally diagnosed 8/14. He then developed atrial flutter 9/15 ejection fraction at that time was 25%.  He underwent cardioversion at that time. ECG was remarkable for extremely low voltage. He's been treated with apixaban. Ejection fraction remains suppressed with echocardiogram 1/16 of 25-30%. Consideration for MRI was presumably deferred secondary to morbid obesity    His functional status is only modestly limited. He is able to exercise mildly. He does not have followed with significant peripheral edema. He's had no palpitations. He has had no syncope.  He comes in today also complaining of pain in his left foot consistent with his gout. He has been on colchicine and allopurinol.  Most recent laboratories included a creatinine of 1.78 and GFR 49 potassium level was normal. This was on Aldactone  Past Medical History  Diagnosis Date  . Hypertension   . Hyperlipidemia   . Chronic systolic congestive heart failure, NYHA class 2     EF 20-25% 1/16  . Organic erectile dysfunction   . Morbid obesity with BMI of 45.0-49.9, adult   . Gout     Of big toe  . CKD (chronic kidney disease) stage 3, GFR 30-59 ml/min   . Atrial flutter     DCCV 9/15  . Non-ischemic cardiomyopathy   . OSA (obstructive sleep apnea)   . Submandibular sialolithiasis     Right  . Pilonidal cyst   . Dyslipidemia   . Anemia of chronic disease   . Chest pain on exertion   . Hyperglycemia   . Arthritis       Surgical History:  Past Surgical  History  Procedure Laterality Date  . Colonoscopy    . Salivary gland surgery  09/12/2012  . Cardiac catheterization  2012  . Submandibular gland excision Right 09/12/2012    Procedure: Removal Right Submandibular Larina Bras;  Surgeon: Serena Colonel, MD;  Location: [facility] OR;  Service: ENT;  Laterality: Right;  . Tee without cardioversion N/A 04/03/2014    Procedure: TRANSESOPHAGEAL ECHOCARDIOGRAM (TEE);  Surgeon: Laurey Morale, MD;  Location: [facility] ENDOSCOPY;  Service: Cardiovascular;  Laterality: N/A;  . Cardioversion N/A 04/03/2014    Procedure: CARDIOVERSION;  Surgeon: Laurey Morale, MD;  Location: [facility] ENDOSCOPY;  Service: Cardiovascular;  Laterality: N/A;  . Left and right heart catheterization with coronary angiogram N/A 04/24/2014    Procedure: LEFT AND RIGHT HEART CATHETERIZATION WITH CORONARY ANGIOGRAM;  Surgeon: Laurey Morale, MD;  Location: [facility] CATH LAB;  Service: Cardiovascular;  Laterality: N/A;     Home Meds: Prior to Admission medications   Medication Sig Start Date End Date Taking? Authorizing Provider  allopurinol (ZYLOPRIM) 100 MG tablet Take 1 tablet (100 mg total) by mouth 2 (two) times daily. 07/28/14 07/28/15 Yes Luisa Dago, MD  apixaban (ELIQUIS) 5 MG TABS tablet Take 5 mg by mouth 2 (two) times daily.   Yes Historical Provider, MD  carvedilol (COREG) 25 MG tablet Take 1 tablet (25 mg total) by mouth 2 (two) times daily with a meal. 01/05/14  Yes Dolores Patty, MD  colchicine 0.6 MG tablet Take  1 tablet (0.6 mg total) by mouth 2 (two) times daily. 07/27/14  Yes Bevelyn Buckles Bensimhon, MD  isosorbide-hydrALAZINE (BIDIL) 20-37.5 MG per tablet Take 2 tablets by mouth 3 (three) times daily. 05/04/14  Yes Laurey Morale, MD  oxyCODONE-acetaminophen (PERCOCET) 10-325 MG per tablet Take 1 tablet by mouth every 6 (six) hours as needed for pain. 07/28/14  Yes Luisa Dago, MD  potassium chloride SA (K-DUR,KLOR-CON) 20 MEQ tablet Take 1 tablet (20 mEq total) by mouth 2 (two) times daily. 01/05/14   Yes Dolores Patty, MD  simvastatin (ZOCOR) 40 MG tablet Take 1 tablet (40 mg total) by mouth at bedtime. 08/20/14  Yes Dolores Patty, MD  spironolactone (ALDACTONE) 25 MG tablet Take 1 tablet (25 mg total) by mouth daily. 01/05/14  Yes Dolores Patty, MD  torsemide (DEMADEX) 20 MG tablet TAKE 3 TABLETS EVERY MORNING AND 2 TABLETS IN THE EVENING 06/29/14  Yes Aundria Rud, NP  traMADol (ULTRAM) 50 MG tablet Take 1 tablet (50 mg total) by mouth every 6 (six) hours as needed. 06/10/14  Yes Fayrene Helper, PA-C      Allergies:  Allergies  Allergen Reactions  . Beta Adrenergic Blockers Other (See Comments)    REACTION: A white colored Beta Blocker made him feel funny.    History   Social History  . Marital Status: Single    Spouse Name: N/A    Number of Children: N/A  . Years of Education: N/A   Occupational History  . Not on file.   Social History Main Topics  . Smoking status: Never Smoker   . Smokeless tobacco: Never Used  . Alcohol Use: No  . Drug Use: No  . Sexual Activity: Not on file   Other Topics Concern  . Not on file   Social History Narrative   Financial assistance approved for 100% discount at [facility] and has [facility] card   [employer]  March 16, 2010 9:42 AM      Lives with mother.  Has a girlfriend     Family History  Problem Relation Age of Onset  . Hypertension Mother   . Hypertension Father   . Cancer Father     brother died of brain cancer; sister died of bone cancer  . Diabetes Sister   . Diabetes Brother   . Colon cancer Maternal Aunt   . Cardiomyopathy Neg Hx      ROS:  Please see the history of present illness.     All other systems reviewed and negative.    Physical Exam: Blood pressure 124/80, pulse 71, height 5' 9.5" (1.765 m), weight 309 lb (140.161 kg). General: Well developed, Morbidly obese AA  male in no acute distress. Head: Normocephalic, atraumatic, sclera non-icteric, no xanthomas, nares are without discharge. EENT:  normal Lymph Nodes:  none Back: without scoliosis/kyphosis, no CVA tendersness Neck: Negative for carotid bruits. JVD not elevated. Lungs: Clear bilaterally to auscultation without wheezes, rales, or rhonchi. Breathing is unlabored. Heart: RRR with S1 S2.  2/6 systolic murmur , rubs, or gallops appreciated. Abdomen: Soft, non-tender, non-distended with normoactive bowel sounds. No hepatomegaly. No rebound/guarding. No obvious abdominal masses. Msk:  Strength and tone appear normal for age. Extremities: No clubbing or cyanosis. Tr - 1+ edema.  Distal pedal pulses are 2+ and equal bilaterally. Skin: Warm and Dry Neuro: Alert and oriented X 3. CN III-XII intact Grossly normal sensory and motor function . Psych:  Responds to questions appropriately with a normal affect.  Labs: Cardiac Enzymes No results for input(s): CKTOTAL, CKMB, TROPONINI in the last 72 hours. CBC Lab Results  Component Value Date   WBC 9.5 06/30/2014   HGB 11.3* 06/30/2014   HCT 35.1* 06/30/2014   MCV 82.2 06/30/2014   PLT 318 06/30/2014   PROTIME: No results for input(s): LABPROT, INR in the last 72 hours. Chemistry   Recent Labs Lab 08/21/14 1057  NA 141  K 3.9  CL 104  CO2 33*  BUN 20  CREATININE 1.78*  CALCIUM 9.5  GLUCOSE 107*   Lipids Lab Results  Component Value Date   CHOL 145 07/10/2013   HDL 31* 07/10/2013   LDLCALC 84 07/10/2013   TRIG 150* 07/10/2013   BNP PRO B NATRIURETIC PEPTIDE (BNP)  Date/Time Value Ref Range Status  04/20/2014 09:38 PM 1869.0* 0 - 125 pg/mL Final  04/14/2014 09:01 AM 814.3* 0 - 125 pg/mL Final  03/24/2014 10:25 AM 1979.0* 0 - 125 pg/mL Final  02/16/2014 09:10 AM 514.3* 0 - 125 pg/mL Final   Miscellaneous No results found for: DDIMER  Radiology/Studies:  No results found.  EKG:  Sinus rhythm with extremely low voltage   Assessment and Plan:   Nonischemic cardiomyopathy  Congestive Heart Failure  Chronic systolic  Morbidly obese  Gout  acute  Low-voltage  Mr. Fitzgerald is a reasonable candidate for consideration of ICD implantation for primary prevention in the context of nonischemic cardiomyopathy and class II congestive heart failure. I have estimated that he would have a 2% or so  annual benefit which would accrue over time. However, he is not inclined to pursue ICD implantation at this juncture. We have reviewed the benefits of ICD implantation and also the risks of the procedure as well as the risks of waiting.  He is euvolemic currently.   I am strikingly impressed by the voltage on his ECG. Echocardiogram demonstrated wall thicknesses were normal (as best as they could be assessed). Still however, I have reviewed this with Dr. DM and he will anticipate pursuing fat pad biopsy. In the African-American population transthyretin amyloid is statistically significantly more likely.  For his gout we will put him on a steroid Dosepak. We will see him again in a couple of months time with an echo and make a decision at that juncture regarding pursuing ICD implantation    Sherryl Manges

## 2014-08-26 NOTE — Progress Notes (Signed)
Discussed patient with Dr Graciela Husbands.  He has low voltage ECG.  Amyloidosis (most likely transthyretin type) would be a consideration.  I would like to set him up for an abdominal fat pad biopsy for assessment of this.  Would like to see him back in office to discuss.

## 2014-08-28 ENCOUNTER — Encounter: Payer: Self-pay | Admitting: *Deleted

## 2014-08-31 ENCOUNTER — Ambulatory Visit (HOSPITAL_COMMUNITY): Payer: Medicaid Other | Attending: Cardiovascular Disease | Admitting: Radiology

## 2014-08-31 DIAGNOSIS — I429 Cardiomyopathy, unspecified: Secondary | ICD-10-CM | POA: Diagnosis not present

## 2014-08-31 DIAGNOSIS — R0602 Shortness of breath: Secondary | ICD-10-CM

## 2014-08-31 DIAGNOSIS — I428 Other cardiomyopathies: Secondary | ICD-10-CM

## 2014-08-31 MED ORDER — REGADENOSON 0.4 MG/5ML IV SOLN
0.4000 mg | Freq: Once | INTRAVENOUS | Status: AC
Start: 2014-08-31 — End: 2014-08-31
  Administered 2014-08-31: 0.4 mg via INTRAVENOUS

## 2014-08-31 MED ORDER — TECHNETIUM TC 99M SESTAMIBI GENERIC - CARDIOLITE
30.0000 | Freq: Once | INTRAVENOUS | Status: AC | PRN
  Administered 2014-08-31: 30 via INTRAVENOUS

## 2014-08-31 NOTE — Progress Notes (Addendum)
[facility] SITE 3 NUCLEAR MED [address] [place], [place] [address] [phone]    Cardiology Nuclear Med Study  Eric Murillo is a 54 y.o. male     MRN : [id number]     DOB: 1960/12/24  Procedure Date: 08/31/2014  Nuclear Med Background Indication for Stress Test:  Evaluation for Ischemia, Abnormal EKG and Possible ICD Implantation History:No prior known history of CAD, Atrial Flutter with cardioversion, and Cardiomyopathy Cardiac Risk Factors: Hypertension and Atrial Flutter  Symptoms:  DOE and Palpitations   Nuclear Pre-Procedure Caffeine/Decaff Intake:  None NPO After: 12:00am   Lungs:  clear O2 Sat: 98% on room air. IV 0.9% NS with Angio Cath:  22g  IV Site: R Hand  IV Started by:  Eric Parsons, RN  Chest Size (in):  54 Cup Size: n/a  Height: 5\' 9"  (1.753 m)  Weight:  309 lb (140.161 kg)  BMI:  Body mass index is 45.61 kg/(m^2). Tech Comments:  No Coreg x 12 hrs. Patient took his Coreg after recovery of Lexiscan.Eric Hong, RN.    Nuclear Med Study 1 or 2 day study: 2 day  Stress Test Type:  Eric Murillo  Reading MD: n/a  Order Authorizing Provider:  Ferman Murillo, and Eric Ancona, MD  Resting Radionuclide: Technetium 71m Sestamibi  Resting Radionuclide Dose: 33.0 mCi on 09/01/14   Stress Radionuclide:  Technetium 30m Sestamibi  Stress Radionuclide Dose: 33.0 mCi on 08/31/14           Stress Protocol Rest HR: 102 Stress HR: 112  Rest BP: 120/90 Stress BP: 128/88  Exercise Time (min): n/a METS: n/a   Predicted Max HR: 167 bpm % Max HR: 67.07 bpm Rate Pressure Product: 20100   Dose of Adenosine (mg):  n/a Dose of Lexiscan: 0.4 mg  Dose of Atropine (mg): n/a Dose of Dobutamine: n/a mcg/kg/min (at max HR)  Stress Test Technologist: Eric Hong, RN  Nuclear Technologist:  Eric Murillo, CNMT     Rest Procedure:  Myocardial perfusion imaging was performed at rest 45 minutes following the intravenous administration of Technetium 80m  Sestamibi. Rest ECG: There is normal sinus rhythm. There is also intermittent ectopic atrial tachycardia at rest with rates up to 105  Stress Procedure:  The patient received IV Lexiscan 0.4 mg over 15-seconds.  Technetium 39m Sestamibi injected at 30-seconds.  The patient complained of chest pain and warmth with Lexiscan.Quantitative spect images were obtained after a 45 minute delay. Stress ECG: No significant change from baseline ECG  QPS Raw Data Images:  Normal; no motion artifact; normal heart/lung ratio. Stress Images:  There is a small area of mild decreased uptake affecting the apical anterior segment, apical inferior segment and the apical cap. Rest Images:  Very isolated area of slight decreased activity at the apical cap. Subtraction (SDS):  Quantitatively there may be slight reversibility near the apex. Transient Ischemic Dilatation (Normal <1.22):  1.05 Lung/Heart Ratio (Normal <0.45):  0.40  Quantitative Gated Spect Images QGS EDV:  n/a QGS ESV:  n/a  Impression Exercise Capacity:  Lexiscan with no exercise. BP Response:  Normal blood pressure response. Clinical Symptoms:  Chest pain ECG Impression:  No significant ST segment change suggestive of ischemia. Comparison with Prior Nuclear Study: No previous nuclear study performed  Overall Impression:  The study is not gated. Therefore I cannot assess the ejection fraction. With no ejection fraction data, I cannot give a risk calculation. The ventricle is dilated. There is no obvious large area of scar.  There may be slight scar near the apex. There may be slight peri-infarct ischemia.  LV Ejection Fraction: Study not gated.  LV Wall Motion:  Study not gated   Eric Bonito, MD  Not high risk study for ischemia.  Please call patient.   Eric Murillo 09/02/2014 9:48 AM

## 2014-09-01 ENCOUNTER — Ambulatory Visit (HOSPITAL_COMMUNITY): Payer: Medicaid Other | Attending: Cardiology

## 2014-09-01 ENCOUNTER — Ambulatory Visit: Payer: Medicaid Other | Admitting: Dietician

## 2014-09-01 DIAGNOSIS — R0989 Other specified symptoms and signs involving the circulatory and respiratory systems: Secondary | ICD-10-CM

## 2014-09-01 MED ORDER — TECHNETIUM TC 99M SESTAMIBI GENERIC - CARDIOLITE
30.0000 | Freq: Once | INTRAVENOUS | Status: AC | PRN
  Administered 2014-09-01: 30 via INTRAVENOUS

## 2014-09-11 ENCOUNTER — Encounter: Payer: Medicaid Other | Admitting: Internal Medicine

## 2014-09-18 ENCOUNTER — Encounter: Payer: Medicaid Other | Admitting: Internal Medicine

## 2014-09-18 ENCOUNTER — Other Ambulatory Visit (HOSPITAL_COMMUNITY): Payer: Self-pay | Admitting: *Deleted

## 2014-09-18 DIAGNOSIS — I5022 Chronic systolic (congestive) heart failure: Secondary | ICD-10-CM

## 2014-09-18 MED ORDER — SPIRONOLACTONE 25 MG PO TABS: 25.0000 mg | ORAL_TABLET | Freq: Every day | ORAL | Status: DC

## 2014-09-22 ENCOUNTER — Other Ambulatory Visit (HOSPITAL_COMMUNITY): Payer: Self-pay | Admitting: *Deleted

## 2014-09-24 ENCOUNTER — Telehealth: Payer: Self-pay | Admitting: Internal Medicine

## 2014-09-24 NOTE — Telephone Encounter (Signed)
Call to patient to confirm appointment for 09/25/14 at 3:45. lmtcb

## 2014-09-25 ENCOUNTER — Encounter: Payer: Self-pay | Admitting: Internal Medicine

## 2014-09-25 ENCOUNTER — Ambulatory Visit (INDEPENDENT_AMBULATORY_CARE_PROVIDER_SITE_OTHER): Payer: Medicaid Other | Admitting: Internal Medicine

## 2014-09-25 VITALS — BP 131/77 | HR 96 | Temp 97.8°F | Ht 69.5 in | Wt 322.8 lb

## 2014-09-25 DIAGNOSIS — M25572 Pain in left ankle and joints of left foot: Secondary | ICD-10-CM

## 2014-09-25 DIAGNOSIS — I110 Hypertensive heart disease with heart failure: Secondary | ICD-10-CM

## 2014-09-25 DIAGNOSIS — I429 Cardiomyopathy, unspecified: Secondary | ICD-10-CM

## 2014-09-25 DIAGNOSIS — I509 Heart failure, unspecified: Secondary | ICD-10-CM

## 2014-09-25 DIAGNOSIS — M19079 Primary osteoarthritis, unspecified ankle and foot: Secondary | ICD-10-CM

## 2014-09-25 DIAGNOSIS — I428 Other cardiomyopathies: Secondary | ICD-10-CM

## 2014-09-25 MED ORDER — OXYCODONE-ACETAMINOPHEN 10-325 MG PO TABS: 1.0000 | ORAL_TABLET | Freq: Four times a day (QID) | ORAL | Status: DC | PRN

## 2014-09-25 NOTE — Assessment & Plan Note (Signed)
  BP Readings from Last 3 Encounters:  09/25/14 131/77  08/31/14 120/90  08/24/14 124/80    Lab Results  Component Value Date   NA 141 08/21/2014   K 3.9 08/21/2014   CREATININE 1.78* 08/21/2014    Assessment: Blood pressure control: At goal. Progress toward BP goal:  At goal.  Plan: Medications:  continue current medications: BiDil 20-30 7.5 mg 2 tabs 3 times a day, torsemide 60 mg every morning and 40 mg every evening, spironolactone 25 mg daily.

## 2014-09-25 NOTE — Addendum Note (Signed)
Addended by: Neomia Dear on: 09/25/2014 06:12 PM   Modules accepted: Orders

## 2014-09-25 NOTE — Patient Instructions (Addendum)
Thank you for coming to clinic today [name].  General instructions: -I refilled your Percocet.  If you continue to have ankle pain, make sure to follow-up with Dr. Lajoyce Corners so he can recheck your uric acid level and potentially restart you on colchicine. -It is very important that you continue to try to lose weight. This is the best thing that you can do to help with your ankle pain. -Please make a follow up appointment to return to clinic in 1 month. -Make sure to follow up with your cardiologist.  Thank you for bringing your medicines today. This helps [place] keep you safe from mistakes.

## 2014-09-25 NOTE — Progress Notes (Signed)
   Subjective:    Patient ID: Eric Murillo, male    DOB: October 17, 1960, 54 y.o.   MRN: [id number]  HPI  is a 54 year old man with history of hypertension, hyperlipidemia, gout, chronic kidney disease, and nonischemic cardio myopathy presented for follow-up of his bilateral ankle pain.  He was seen in clinic on 07/28/2014. It was thought that his ankle pain was related to a combination of gout and osteoarthritis given his morbid obesity. He was referred for diet and lifestyle education, and he was started on Percocet 10-325 mg every 6 hours as needed. He was given a prescription for one month at that time. He was told to return to clinic with a pain contract. His allopurinol had been increased 200 mg twice a day.  He followed up with heart failure clinic on 08/21/2014. He was referred to electrophysiology to discuss placement of an ICD. He was continued on his home medications.  He is scheduled to see them again on 10/01/2014.  He saw Dr. Graciela Husbands of cardiology on 08/24/2014 to discuss possible ICD implantation given his low ejection fraction of 25-30%. ICD implantation was deferred at that time. He discussed pursuing fat pad biopsy to rule out amyloid cardiomyopathy given low voltage on ECG and lack of explanation for his cardiomyopathy. He is also given a steroid Dosepak to treat his gout.  He reports that his ankle pain had been feeling significantly better until a few days ago.  He says that Percocet had helped him become more mobile and active.  He also thinks the steroid course that Dr. Graciela Husbands prescribed helped with ankle pain.  He has noted that he gained some weight after completing the steroid course.  He stopped taking colchicine in January because Dr. Lajoyce Corners told him his uric acid was at goal.  He continues to take allopurinol 100 mg twice per day.  Review of Systems  Constitutional: Negative for fever, chills and diaphoresis.  HENT: Negative for congestion, rhinorrhea and sore throat.     Respiratory: Negative for cough and shortness of breath.   Cardiovascular: Positive for leg swelling. Negative for chest pain and palpitations.  Gastrointestinal: Negative for nausea, vomiting, diarrhea, constipation and abdominal distention.  Genitourinary: Negative for dysuria.  Musculoskeletal: Positive for arthralgias. Negative for myalgias and back pain.  Skin: Negative for rash.  Neurological: Negative for dizziness, weakness and numbness.       Objective:   Physical Exam  Constitutional: He is oriented to person, place, and time. He appears well-developed and well-nourished. No distress.  HENT:  Head: Normocephalic and atraumatic.  Mouth/Throat: No oropharyngeal exudate.  Eyes: Conjunctivae and EOM are normal. Pupils are equal, round, and reactive to light.  Neck: Normal range of motion. Neck supple.  Cardiovascular: Normal rate, regular rhythm and normal heart sounds.   Pulmonary/Chest: Effort normal and breath sounds normal. No respiratory distress. He has no wheezes.  Abdominal: Soft. Bowel sounds are normal. He exhibits no distension. There is no tenderness.  Musculoskeletal: Normal range of motion. He exhibits edema (1+ bilateral lower extremities.) and tenderness (Left ankle.).  Neurological: He is alert and oriented to person, place, and time. No cranial nerve deficit. He exhibits normal muscle tone.  Skin: Skin is warm and dry. No rash noted. No erythema.        Assessment & Plan:  Please see problem-based assessment and plan.

## 2014-09-25 NOTE — Assessment & Plan Note (Signed)
He denies symptoms of heart failure currently. He has gained some weight, but this is most likely due to the steroid course he was recently on. He has mild edema on exam with no crackles. The cause of his heart failure remains unclear. There is some thought that his heart failure could be due to amyloid cardiomyopathy given his low voltage EKG, and Dr. Shirlee Latch plans to discuss fat pad biopsy at his next appointment. This would not be consistent with a dilated cardiomyopathy, but could be contributing. -Check myeloma serum panel. However, may not have positive result in transthyretin type. -Defer scheduling of fat pad biopsy to Dr. Shirlee Latch. -Continue current medical management. -Follow-up with heart failure clinic next week.

## 2014-09-25 NOTE — Progress Notes (Signed)
Medicine attending: Medical history, presenting problems, physical findings, and medications, reviewed with Dr Everett Moding and I concur with his evaluation and management plan. 

## 2014-09-25 NOTE — Assessment & Plan Note (Addendum)
His pain is due to a combination of osteoarthritis and gout. His gout is being managed by Dr. Lajoyce Corners, who reportedly checked a uric acid level and found it to be at goal. Thus, he stopped taking colchicine. His pain is mostly bilateral, though he does have some swelling in his left ankle, suggesting that gout could be playing a role as well. I continue to think that weight loss is the best treatment for him to reduce his pain. Unfortunately, he has been unable to exercise due to the pain. He reports that the Percocet prescribed at his last visit has been helping significantly. We will pursue opioids with goal of increasing his activity and allowing for weight loss. -Completed pain contract and recorded in chart. -Refilled Percocet 10-325 milligrams every 6 hours when necessary, #90. -Instructed patient to follow-up with Dr. Lajoyce Corners, regarding management of his gout. He may benefit from restarting colchicine. -Continue allopurinol 100 mg twice a day. -Discussed diet and lifestyle modifications to prevent gout flares and assist with weight loss. -Return to clinic in 1 month for medication refill. Check UDS at that time.

## 2014-09-30 LAB — MULTIPLE MYELOMA PANEL, SERUM
ALBUMIN ELP: 59.1 % (ref 55.8–66.1)
ALPHA-1-GLOBULIN: 3.9 % (ref 2.9–4.9)
ALPHA-2-GLOBULIN: 9.9 % (ref 7.1–11.8)
Beta 2: 7.6 % — ABNORMAL HIGH (ref 3.2–6.5)
Beta Globulin: 6.8 % (ref 4.7–7.2)
Gamma Globulin: 12.7 % (ref 11.1–18.8)
IGA: 528 mg/dL — AB (ref 68–379)
IGM, SERUM: 27 mg/dL — AB (ref 41–251)
IgG (Immunoglobin G), Serum: 1080 mg/dL (ref 650–1600)
TOTAL PROTEIN: 7.2 g/dL (ref 6.0–8.3)

## 2014-10-01 ENCOUNTER — Ambulatory Visit (HOSPITAL_COMMUNITY)
Admission: RE | Admit: 2014-10-01 | Discharge: 2014-10-01 | Disposition: A | Discharge status: Home / Self Care | Payer: Medicaid Other | Source: Ambulatory Visit | Attending: Internal Medicine | Admitting: Internal Medicine

## 2014-10-01 ENCOUNTER — Encounter (HOSPITAL_COMMUNITY): Payer: Self-pay

## 2014-10-01 VITALS — BP 104/76 | HR 88 | Resp 18 | Wt 316.2 lb

## 2014-10-01 DIAGNOSIS — N183 Chronic kidney disease, stage 3 unspecified: Secondary | ICD-10-CM

## 2014-10-01 DIAGNOSIS — I484 Atypical atrial flutter: Secondary | ICD-10-CM | POA: Insufficient documentation

## 2014-10-01 DIAGNOSIS — E785 Hyperlipidemia, unspecified: Secondary | ICD-10-CM | POA: Insufficient documentation

## 2014-10-01 DIAGNOSIS — E669 Obesity, unspecified: Secondary | ICD-10-CM | POA: Diagnosis not present

## 2014-10-01 DIAGNOSIS — G4733 Obstructive sleep apnea (adult) (pediatric): Secondary | ICD-10-CM | POA: Diagnosis not present

## 2014-10-01 DIAGNOSIS — I5022 Chronic systolic (congestive) heart failure: Secondary | ICD-10-CM | POA: Insufficient documentation

## 2014-10-01 DIAGNOSIS — Z7901 Long term (current) use of anticoagulants: Secondary | ICD-10-CM | POA: Insufficient documentation

## 2014-10-01 DIAGNOSIS — N189 Chronic kidney disease, unspecified: Secondary | ICD-10-CM | POA: Diagnosis not present

## 2014-10-01 DIAGNOSIS — I129 Hypertensive chronic kidney disease with stage 1 through stage 4 chronic kidney disease, or unspecified chronic kidney disease: Secondary | ICD-10-CM | POA: Diagnosis not present

## 2014-10-01 DIAGNOSIS — K115 Sialolithiasis: Secondary | ICD-10-CM | POA: Diagnosis not present

## 2014-10-01 DIAGNOSIS — Z79899 Other long term (current) drug therapy: Secondary | ICD-10-CM | POA: Diagnosis not present

## 2014-10-01 DIAGNOSIS — M109 Gout, unspecified: Secondary | ICD-10-CM | POA: Insufficient documentation

## 2014-10-01 DIAGNOSIS — R079 Chest pain, unspecified: Secondary | ICD-10-CM

## 2014-10-01 DIAGNOSIS — I429 Cardiomyopathy, unspecified: Secondary | ICD-10-CM | POA: Insufficient documentation

## 2014-10-01 NOTE — Progress Notes (Signed)
Patient ID: Eric Murillo, male   DOB: 02/23/1961, 54 y.o.   MRN: [id number] PCP: Dr. Glenard Haring  54 yo with history of obesity, hyperlipidemia, HTN, atrial flutter (s/p TEE DC-CV 04/03/14), NICM, chronic systolic HF and severe OSA but unable to tolerate CPAP.     He was admitted in 10/205 with increased exertional dyspnea and chest tightness.  RHC/LHC showed elevated filling pressures, preserved cardiac output, and no significant coronary disease.  He was diuresed and discharged home.  Weight is up 4 lbs.    He returns for follow up. Overall feeling ok. Struggling with gout pain in his ankles. Denies SOB/PND/Orthopnea.  Weight at home up to 311 pounds.  Using CPAP rarely.  Following low diet. Taking all medications. No bleeding problems.   Labs (5/14): K 3.7, creatinine 1.5 Labs (12/14): K 4.2, creatinine 1.27, LDL 84, HDL 31, TSH normal Labs (3/15): K 4.6, creatinine 1.29 Labs (10/07/13) K 3.9 Creatinine 1.44 Labs (11/14/13) K 4.2, creatinine 1.48 Labs (12/05/13) K 4.8, creatinine 2.01  Labs (12/2013): K 4.3, creatinine 1.85, BUN 36, pro-BNP 884 Labs (7/15): K 3.5, creatinine 1.53 Labs (9/15): K 4, creatinine 1.9.   Labs (10/15): K 4.3, creatinine 1.7 Labs (11/15): K 4.4, creatinine 2.19 Lbas (06/29/2014) K 4.0 Creatinine 1.96   Past Medical History: 1. Nonischemic cardiomyopathy: Echo (3/12) with EF 30-35% and mildly dilated LV, diffuse LV hypokinesis, moderate MR, PA systolic pressure 55 mmHg.  Left and right heart cath (3/12): No angiographic CAD; mean RA 20, PA 76/41, mean PCWP 38, CI 2.1.  ANA, SPEP, and HIV negative.  TSH normal.  Denies drug abuse, heavy ETOH intake.  No family history of cardiomyopathy.  Cardiomyopathy may be due to long-standing HTN.  Echo (6/13): EF 35-40%, moderate LV dilation, moderate to severe LAE, PA systolic pressure 52 mmHg.  Unable to fit in magnet for cardiac MRI.  Echo (8/14) with EF 45%, moderately dilated LV, mild LVH, normal RV, PA systolic pressure 49 mmHg. TEE  (9/15) with EF 25-30% with global hypokinesis, mild to moderate MR, mildly decreased RV systolic function. LHC/RHC (10/15) with no significant coronary disease; mean RA 14, PA 65/25 mean 42, unable to obtain PCWP but LVEDP 24, CI 2.69.   2. ERECTILE DYSFUNCTION 3. HYPERTENSION  4. CKD 5. DYSLIPIDEMIA  6. Obesity 7. Hyperlipidemia 8. Gout 9. OSA: Severe on 6/13 sleep study.  10. Sialolithiasis 11. Atypical Atrial Flutter: TEE-DC-CV (04/03/14) which was successful;    Family History: No history of cardiomyopathy No history of cancer among first degree relatives father - died of MI (76s)  mother - HTN  Social History: Lives girlfriend.  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 tobacco - none alcohol - none drugs - none  ROS: All systems reviewed and negative except as per HPI.   Current Outpatient Prescriptions  Medication Sig Dispense Refill  . allopurinol (ZYLOPRIM) 100 MG tablet Take 1 tablet (100 mg total) by mouth 2 (two) times daily. 60 tablet 2  . apixaban (ELIQUIS) 5 MG TABS tablet Take 5 mg by mouth 2 (two) times daily.    . carvedilol (COREG) 25 MG tablet Take 1 tablet (25 mg total) by mouth 2 (two) times daily with a meal. 60 tablet 6  . colchicine 0.6 MG tablet Take 1 tablet (0.6 mg total) by mouth 2 (two) times daily. 60 tablet 0  . isosorbide-hydrALAZINE (BIDIL) 20-37.5 MG per tablet Take 2 tablets by mouth 3 (three) times daily. 180 tablet 3  . oxyCODONE-acetaminophen (PERCOCET) 10-325  MG per tablet Take 1 tablet by mouth every 6 (six) hours as needed for pain. 90 tablet 0  . potassium chloride SA (K-DUR,KLOR-CON) 20 MEQ tablet Take 1 tablet (20 mEq total) by mouth 2 (two) times daily. 60 tablet 3  . simvastatin (ZOCOR) 40 MG tablet Take 1 tablet (40 mg total) by mouth at bedtime. 30 tablet 3  . spironolactone (ALDACTONE) 25 MG tablet Take 1 tablet (25 mg total) by mouth daily. 30 tablet 6  . torsemide (DEMADEX) 20 MG tablet TAKE 3 TABLETS EVERY MORNING AND 2  TABLETS IN THE EVENING 150 tablet 1   No current facility-administered medications for this encounter.    Filed Vitals:   10/01/14 0945  BP: 104/76  Pulse: 88  Resp: 18  Weight: 316 lb 4 oz (143.45 kg)  SpO2: 98%   General: NAD, obese.  Neck:  Thick, JVP does not appear elevated. no thyromegaly or thyroid nodule.  Lungs: Clear to auscultation bilaterally with normal respiratory effort. CV: Nondisplaced PMI.  Heart regular S1/S2, no murmur.  No carotid bruit.   Abdomen: Soft, nontender, no hepatosplenomegaly, mildly distention.  Neurologic: Alert and oriented x 3.  Psych: Normal affect. Extremities: No clubbing or cyanosis. Trace lower extremity edema  ECG: NSR 86 with 1AVB (216 ms) IVCD 108 LAFB No ST-T wave abnormalities.    Assessment/Plan:  Chronic Systolic heart failure: Nonischemic cardiomyopathy, likely related to HTN initially.  Echo in 8/14 with EF 45% but EF down to 25-30% on TEE while in atrial flutter (03/2014). Today ECHO with EF ~25%. No cardiac MRI given creatinine 1.96. Concern for amyloid . No M spike. Will refer to IR for fat pad biopsy Has follow up with Dr Graciela Husbands next month.  Stable symptoms (NYHA II). Volume status stable. Continue torsemide 60 qam, 40 qpm - Continue Coreg 25 mg bid and spironolactone 25 mg daily.  - Continue Bidil 2 tabs tid.   - No ACEI with elevated creatinine.  - Reinforced the need and importance of daily weights, a low sodium diet, and fluid restriction (less than 2 L a day). Instructed to call the HF clinic if weight increases more than 3 lbs overnight or 5 lbs in a week. .  CKD: Check BMET today  HYPERTENSION: Stable. BP controlled.   OSA: Not very compliant with CPAP. Discussed the need to wear as much as possible.  Obesity: Discussed again the need to lose weight and to watch his portion sizes.  Atrial flutter: Atypical atrial flutter, paroxysmal in early September.  He is now back in NSR after DCCV.  CHADSVASC = 2 (HTN, CHF).  Continue eliquis  At 5 mg twice a day. Gout: Per PCP. Limited mobility due to L ankle pain.Followed by Dr Lajoyce Corners.    Follow up in 8 weeks.    CLEGG,AMY NP-C  09/30/2064   Patient seen and examined with Tonye Becket, NP. We discussed all aspects of the encounter. I agree with the assessment and plan as stated above. Overall stable. Volume ok. On good meds.  SPEP/UPEP negative. Unable to get cMRI to assess for amyloid. Will refer to IR for fat pad biopsy. Suspect more likely hypertensive heart and renal disease. Has f/u with Dr. Graciela Husbands for ICD.   Annya Lizana,MD 1:14 AM

## 2014-10-01 NOTE — Patient Instructions (Signed)
Follow up 2 months.  Do the following things EVERYDAY: 1) Weigh yourself in the morning before breakfast. Write it down and keep it in a log. 2) Take your medicines as prescribed 3) Eat low salt foods-Limit salt (sodium) to 2000 mg per day.  4) Stay as active as you can everyday 5) Limit all fluids for the day to less than 2 liters  

## 2014-10-01 NOTE — Progress Notes (Signed)
Reviewed. Non specific elevation of IgA, no monoclonal proteins on IFE, = no evidence for myeloma

## 2014-10-26 ENCOUNTER — Other Ambulatory Visit (HOSPITAL_COMMUNITY): Payer: Medicaid Other

## 2014-10-26 ENCOUNTER — Other Ambulatory Visit (HOSPITAL_COMMUNITY): Payer: Self-pay | Admitting: Internal Medicine

## 2014-10-26 ENCOUNTER — Ambulatory Visit: Payer: Medicaid Other | Admitting: Internal Medicine

## 2014-10-29 ENCOUNTER — Telehealth: Payer: Self-pay | Admitting: Internal Medicine

## 2014-10-29 NOTE — Telephone Encounter (Signed)
Call to patient to confirm appointment for 10/30/14 at 2:15 lmtcb ° °

## 2014-10-30 ENCOUNTER — Encounter: Payer: Self-pay | Admitting: Internal Medicine

## 2014-10-30 ENCOUNTER — Ambulatory Visit (INDEPENDENT_AMBULATORY_CARE_PROVIDER_SITE_OTHER): Payer: Medicaid Other | Admitting: Internal Medicine

## 2014-10-30 VITALS — BP 132/72 | HR 92 | Temp 97.9°F | Ht 69.5 in | Wt 327.8 lb

## 2014-10-30 DIAGNOSIS — M25572 Pain in left ankle and joints of left foot: Secondary | ICD-10-CM

## 2014-10-30 DIAGNOSIS — I429 Cardiomyopathy, unspecified: Secondary | ICD-10-CM

## 2014-10-30 DIAGNOSIS — M19079 Primary osteoarthritis, unspecified ankle and foot: Secondary | ICD-10-CM

## 2014-10-30 DIAGNOSIS — I428 Other cardiomyopathies: Secondary | ICD-10-CM

## 2014-10-30 MED ORDER — OXYCODONE-ACETAMINOPHEN 10-325 MG PO TABS: 1.0000 | ORAL_TABLET | Freq: Four times a day (QID) | ORAL | Status: DC | PRN

## 2014-10-30 NOTE — Assessment & Plan Note (Signed)
His pain is well controlled currently. His weight is up approximately 5 pounds, but this could be due to some fluid retention. He says that he has improved activity while taking Percocet. Was planning to do UDS today, but she did not receive his Percocet today because he ran out last week. He is unable to exercise much due to his heart failure, as a result he will need to focus on diet for weight loss. -Referral to Lupita Leash for diet and lifestyle education. -Discussed goal for ongoing weight loss. Patient agrees to lose weight to continue to receive opioids. -Refilled Percocet 10-325 milligrams every 6 hours as needed, #90. Gave prescriptions for 2 months. -Asked patient to try to take 3 tablets per day to make his prescription last 1 month. -Follow-up in 2 months with UDS at that time. -Follow-up with Dr. Lajoyce Corners next week.

## 2014-10-30 NOTE — Progress Notes (Signed)
Internal Medicine Clinic Attending  Case discussed with Dr. Moding at the time of the visit.  We reviewed the resident's history and exam and pertinent patient test results.  I agree with the assessment, diagnosis, and plan of care documented in the resident's note. 

## 2014-10-30 NOTE — Patient Instructions (Addendum)
Thank you for coming to clinic today Eric Murillo.  General instructions: -Make sure to restrict your fluid intake to less than 2 L per day. -I refilled your Percocet for 2 months. I would like to see you in follow-up at that time to make sure that you are making progress towards losing weight. Try to cut back to 3 tablets per day so that your pain medication lasts you a full month. -Make sure you follow up with Dr. Lajoyce Corners and heart failure clinic. -Please make a follow up appointment to return to clinic in 2 months.  Please bring your medicines with you each time you come.   Medicines may be  Eye drops  Herbal   Vitamins  Pills  Seeing these help [place] take care of you.

## 2014-10-30 NOTE — Progress Notes (Signed)
   Subjective:    Patient ID: Eric Murillo, male    DOB: May 10, 1961, 54 y.o.   MRN: [id number]  HPI Eric Murillo is a 54 year old man with history of hypertension, hyperlipidemia, gout, chronic kidney disease, and nonischemic cardiomyopathy presenting for follow-up of his bilateral ankle pain.  He was last seen in clinic on 09/24/2012. A pain contract was completed, and he was started on Percocet to help ease his pain and allow him to exercise to lose weight.  He reports no pain today, and he thinks that the Percocet has been helping.  He has been taking it 4 times per day.  He says that he ran out of the pain medication last week.  He also got some new boots that provide more support for his ankles.  He is scheduled to see Dr. Lajoyce Corners on Monday.  I ordered a SPEP/UPEP to workup the cause of his ischemic cardiomyopathy, which was negative.  He was seen in heart failure clinic on 10/01/2014, and his cardiac medications were continued. A fat pad biopsy was ordered due to concern for amyloid. He is unable to undergo a cardiac MRI due to his renal function. Per Dr. Gala Romney, heart failure is most likely secondary to hypertensive heart and renal disease.  He says the biopsy will be scheduled for next week.  He denies shortness of breath, chest pain, or palpitations.  Review of Systems  Constitutional: Negative for fever, chills and fatigue.  HENT: Negative for congestion.   Respiratory: Negative for cough, chest tightness, shortness of breath and wheezing.   Cardiovascular: Positive for leg swelling (mild.). Negative for chest pain.  Gastrointestinal: Negative for nausea, vomiting, abdominal pain, diarrhea and constipation.  Genitourinary: Negative for dysuria.  Musculoskeletal: Negative for myalgias, back pain and joint swelling.  Skin: Negative for rash.  Neurological: Negative for dizziness, weakness and numbness.       Objective:   Physical Exam  Constitutional: He is oriented to person,  place, and time. He appears well-developed and well-nourished. No distress.  Obese.  HENT:  Head: Normocephalic and atraumatic.  Eyes: Conjunctivae and EOM are normal. Pupils are equal, round, and reactive to light. No scleral icterus.  Neck: Normal range of motion. Neck supple.  Cardiovascular: Normal rate, regular rhythm and normal heart sounds.   Pulmonary/Chest: Effort normal. No respiratory distress. He has no wheezes. He has no rales.  Abdominal: Soft. Bowel sounds are normal. He exhibits no distension. There is no tenderness.  Musculoskeletal: Normal range of motion. He exhibits edema (1+ pitting bilateral lower extremities.). He exhibits no tenderness.  Neurological: He is alert and oriented to person, place, and time. No cranial nerve deficit. He exhibits normal muscle tone.  Skin: Skin is warm and dry. He is not diaphoretic.          Assessment & Plan:  Please see problem-based assessment and plan.

## 2014-10-30 NOTE — Assessment & Plan Note (Signed)
Patient continues to follow with heart failure clinic. He is retaining some fluid in his lower extremity, but he denies any shortness of breath or chest pain currently. This is most likely due to fluid intake in his diet. Rather than increasing his diuretics, we will attempt to have the patient cut back on fluids. Patient reports he was scheduled for a fat pad biopsy next week to evaluate for amyloid cardiomyopathy. However, I agree with Dr. Gala Romney that his heart failure is most likely secondary to hypertensive heart disease. Blood pressure controlled today. SPEP/UPEP negative. -Continue torsemide 60 mg every morning and 40 mg every evening with Klor-Con 20 mEq daily. -Continue spironolactone 25 mg daily. -Continue carvedilol 25 mg twice a day, BiDil 20-37.5 mg 3 times a day. -Continue to follow with heart failure clinic. -Restrict fluid intake to less than 2 L. Monitor weight and call heart failure clinic if continued weight gain or new symptoms. -Fat pad biopsy next week.

## 2014-11-05 ENCOUNTER — Other Ambulatory Visit (HOSPITAL_COMMUNITY): Payer: Self-pay | Admitting: Internal Medicine

## 2014-11-20 ENCOUNTER — Encounter: Payer: Self-pay | Admitting: *Deleted

## 2014-11-27 ENCOUNTER — Other Ambulatory Visit (HOSPITAL_COMMUNITY): Payer: Self-pay | Admitting: Cardiology

## 2014-12-04 ENCOUNTER — Ambulatory Visit: Payer: Medicaid Other | Admitting: Dietician

## 2014-12-07 ENCOUNTER — Encounter (HOSPITAL_COMMUNITY): Payer: Medicaid Other

## 2014-12-11 ENCOUNTER — Encounter: Payer: Self-pay | Admitting: Dietician

## 2014-12-11 ENCOUNTER — Ambulatory Visit (INDEPENDENT_AMBULATORY_CARE_PROVIDER_SITE_OTHER): Payer: Medicaid Other | Admitting: Dietician

## 2014-12-11 VITALS — Ht 69.5 in | Wt 327.6 lb

## 2014-12-11 DIAGNOSIS — Z713 Dietary counseling and surveillance: Secondary | ICD-10-CM

## 2014-12-11 DIAGNOSIS — Z6841 Body Mass Index (BMI) 40.0 and over, adult: Secondary | ICD-10-CM
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
## 2014-12-11 NOTE — Patient Instructions (Addendum)
To CUT CALORIES: Decrease your Juice to 4 ounces (1/2 cup) per day    To SELF MONITOR IF Cutting the juice out IS WORKING: weight yourself every day   To get SUPPORT to keep yourself motivated- please make a follow up in 2-4 weeks

## 2014-12-17 ENCOUNTER — Ambulatory Visit (HOSPITAL_COMMUNITY)
Admission: RE | Admit: 2014-12-17 | Discharge: 2014-12-17 | Disposition: A | Discharge status: Home / Self Care | Payer: Medicaid Other | Source: Ambulatory Visit | Attending: Internal Medicine | Admitting: Internal Medicine

## 2014-12-17 VITALS — BP 124/76 | HR 87 | Wt 326.0 lb

## 2014-12-17 DIAGNOSIS — Z79899 Other long term (current) drug therapy: Secondary | ICD-10-CM | POA: Diagnosis not present

## 2014-12-17 DIAGNOSIS — E669 Obesity, unspecified: Secondary | ICD-10-CM | POA: Diagnosis not present

## 2014-12-17 DIAGNOSIS — I484 Atypical atrial flutter: Secondary | ICD-10-CM | POA: Insufficient documentation

## 2014-12-17 DIAGNOSIS — Z7901 Long term (current) use of anticoagulants: Secondary | ICD-10-CM | POA: Diagnosis not present

## 2014-12-17 DIAGNOSIS — I428 Other cardiomyopathies: Secondary | ICD-10-CM | POA: Diagnosis not present

## 2014-12-17 DIAGNOSIS — M109 Gout, unspecified: Secondary | ICD-10-CM | POA: Insufficient documentation

## 2014-12-17 DIAGNOSIS — G4733 Obstructive sleep apnea (adult) (pediatric): Secondary | ICD-10-CM | POA: Insufficient documentation

## 2014-12-17 DIAGNOSIS — I129 Hypertensive chronic kidney disease with stage 1 through stage 4 chronic kidney disease, or unspecified chronic kidney disease: Secondary | ICD-10-CM | POA: Diagnosis not present

## 2014-12-17 DIAGNOSIS — E785 Hyperlipidemia, unspecified: Secondary | ICD-10-CM | POA: Insufficient documentation

## 2014-12-17 DIAGNOSIS — I5022 Chronic systolic (congestive) heart failure: Secondary | ICD-10-CM | POA: Diagnosis not present

## 2014-12-17 MED ORDER — TORSEMIDE 20 MG PO TABS: 60.0000 mg | ORAL_TABLET | Freq: Two times a day (BID) | ORAL | Status: DC

## 2014-12-17 NOTE — Progress Notes (Signed)
Patient ID: Eric Murillo, male   DOB: [birth year], 54 y.o.   MRN: [id number] PCP: Dr. Glenard Haring  54 yo with history of mrobid obesity, hyperlipidemia, HTN, atrial flutter (s/p TEE DC-CV 04/03/14), NICM, chronic systolic HF and severe OSA but unable to tolerate CPAP.     He was admitted in 04/2014 with increased exertional dyspnea and chest tightness.  RHC/LHC showed elevated filling pressures, preserved cardiac output, and no significant coronary disease.  He was diuresed and discharged home.  Weight is up 4 lbs.  TEE (9/15) with EF 25-30% with global hypokinesis, mild to moderate MR, mildly decreased RV systolic function.   He returns for follow up. Overall feeling ok.  Denies SOB/PND/Orthopnea.  Weight at home up to 320-324 pounds. Tremendous appetite.  Using CPAP rarely.  Following low diet. Taking all medications. No bleeding problems. Drinking > 2 liters per day.   Labs (5/14): K 3.7, creatinine 1.5 Labs (12/14): K 4.2, creatinine 1.27, LDL 84, HDL 31, TSH normal Labs (3/15): K 4.6, creatinine 1.29 Labs (10/07/13) K 3.9 Creatinine 1.44 Labs (11/14/13) K 4.2, creatinine 1.48 Labs (12/05/13) K 4.8, creatinine 2.01  Labs (12/2013): K 4.3, creatinine 1.85, BUN 36, pro-BNP 884 Labs (7/15): K 3.5, creatinine 1.53 Labs (9/15): K 4, creatinine 1.9.   Labs (10/15): K 4.3, creatinine 1.7 Labs (11/15): K 4.4, creatinine 2.19 Labs (06/29/2014) K 4.0 Creatinine 1.96  Labs (08/21/2014) K 3.9 Creatinine 1.78  Past Medical History: 1. Nonischemic cardiomyopathy: Echo (3/12) with EF 30-35% and mildly dilated LV, diffuse LV hypokinesis, moderate MR, PA systolic pressure 55 mmHg.  Left and right heart cath (3/12): No angiographic CAD; mean RA 20, PA 76/41, mean PCWP 38, CI 2.1.  ANA, SPEP, and HIV negative.  TSH normal.  Denies drug abuse, heavy ETOH intake.  No family history of cardiomyopathy.  Cardiomyopathy may be due to long-standing HTN.  Echo (6/13): EF 35-40%, moderate LV dilation, moderate to severe LAE, PA  systolic pressure 52 mmHg.  Unable to fit in magnet for cardiac MRI.  Echo (8/14) with EF 45%, moderately dilated LV, mild LVH, normal RV, PA systolic pressure 49 mmHg. TEE (9/15) with EF 25-30% with global hypokinesis, mild to moderate MR, mildly decreased RV systolic function. LHC/RHC (10/15) with no significant coronary disease; mean RA 14, PA 65/25 mean 42, unable to obtain PCWP but LVEDP 24, CI 2.69.   2. ERECTILE DYSFUNCTION 3. HYPERTENSION  4. CKD 5. DYSLIPIDEMIA  6. Obesity 7. Hyperlipidemia 8. Gout 9. OSA: Severe on 6/13 sleep study.  10. Sialolithiasis 11. Atypical Atrial Flutter: TEE-DC-CV (04/03/14) which was successful;    Family History: No history of cardiomyopathy No history of cancer among first degree relatives father - died of MI (26s)  mother - HTN  Social History: Lives girlfriend.  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 tobacco - none alcohol - none drugs - none  ROS: All systems reviewed and negative except as per HPI.   Current Outpatient Prescriptions  Medication Sig Dispense Refill  . allopurinol (ZYLOPRIM) 100 MG tablet Take 1 tablet (100 mg total) by mouth 2 (two) times daily. 60 tablet 2  . carvedilol (COREG) 25 MG tablet TAKE 1 TABLET BY MOUTH TWICE A DAY WITH A MEAL 60 tablet 6  . ELIQUIS 5 MG TABS tablet TAKE 1 TABLET BY MOUTH TWICE A DAY 60 tablet 6  . isosorbide-hydrALAZINE (BIDIL) 20-37.5 MG per tablet Take 2 tablets by mouth 3 (three) times daily. 180 tablet 3  . KLOR-CON M20 20  MEQ tablet TAKE 1 TABLET BY MOUTH TWICE A DAY 60 tablet 3  . oxyCODONE-acetaminophen (PERCOCET) 10-325 MG per tablet Take 1 tablet by mouth every 6 (six) hours as needed for pain. 90 tablet 0  . simvastatin (ZOCOR) 40 MG tablet Take 1 tablet (40 mg total) by mouth at bedtime. 30 tablet 3  . spironolactone (ALDACTONE) 25 MG tablet Take 1 tablet (25 mg total) by mouth daily. 30 tablet 6  . torsemide (DEMADEX) 20 MG tablet TAKE 3 TABLETS EVERY MORNING AND 2  TABLETS IN THE EVENING 150 tablet 1  . colchicine 0.6 MG tablet Take 1 tablet (0.6 mg total) by mouth 2 (two) times daily. (Patient not taking: Reported on 10/30/2014) 60 tablet 0   No current facility-administered medications for this encounter.    Filed Vitals:   12/17/14 1207  BP: 124/76  Weight: 326 lb (147.873 kg)   General: NAD, obese.  Neck:  Thick, JVP appears elevated. No thyromegaly or thyroid nodule.  Lungs: Clear to auscultation bilaterally with normal respiratory effort. CV: Nondisplaced PMI.  Heart regular S1/S2, no murmur.  No carotid bruit.   Abdomen: Soft, nontender, no hepatosplenomegaly, mildly distention.  Neurologic: Alert and oriented x 3.  Psych: Normal affect. Extremities: No clubbing or cyanosis. Trace lower extremity edema    Assessment/Plan:  Chronic Systolic heart failure: Nonischemic cardiomyopathy, likely related to HTN initially.  Echo in 8/14 with EF 45% but EF down to 25-30% on TEE while in atrial flutter (03/2014).  ECHO (1/16) with EF ~25%. No cardiac MRI given creatinine 1.96. Concern for amyloid . No M spike.  --Missed appointment with  Dr Graciela Husbands. Rescheduled for next month to discuss ICD - Stable NYHA II. Volume status mildly elevated. Increase torsemide 60 bid - Continue Coreg 25 mg bid and spironolactone 25 mg daily.  - Continue Bidil 2 tabs tid.   - Has not been on ACEI due to elevated creatinine. Would consider low-dose if renal function stable - Reinforced the need and importance of daily weights, a low sodium diet, and fluid restriction (less than 2 L a day). Instructed to call the HF clinic if weight increases more than 3 lbs overnight or 5 lbs in a week. .  CKD:  HYPERTENSION: Stable. BP controlled.   OSA: Not very compliant with CPAP. Discussed the need to wear as much as possible.  Obesity: Discussed again the need to lose weight and to watch his portion sizes.  Atrial flutter: Atypical atrial flutter, paroxysmal in early September.  He  is now back in NSR after DCCV.  CHADSVASC = 2 (HTN, CHF). Continue eliquis  At 5 mg twice a day. Gout: Per PCP. Followed by Dr Lajoyce Corners.    Follow up in 8 weeks.    CLEGG,AMY NP-C  12/17/2014   Patient seen and examined with Tonye Becket, NP. We discussed all aspects of the encounter. I agree with the assessment and plan as stated above.   Volume status mildly elevated. Agree with increasing torsemide. Watch renal function. Stressed need to f/u with EP for ICD. Needs Optivol device as it is hard to assess volume status given size.   Azarius Lambson,MD 9:31 PM

## 2014-12-17 NOTE — Patient Instructions (Signed)
Increase Torsemide to 60 mg (3 tabs) Twice daily   Labs in 1 week  Your physician recommends that you schedule a follow-up appointment in: 3 months

## 2014-12-23 ENCOUNTER — Other Ambulatory Visit (HOSPITAL_COMMUNITY): Payer: Self-pay | Admitting: Internal Medicine

## 2014-12-24 ENCOUNTER — Ambulatory Visit (HOSPITAL_COMMUNITY)
Admission: RE | Admit: 2014-12-24 | Discharge: 2014-12-24 | Disposition: A | Discharge status: Home / Self Care | Payer: Medicaid Other | Source: Ambulatory Visit | Attending: Cardiology | Admitting: Cardiology

## 2014-12-24 DIAGNOSIS — I5042 Chronic combined systolic (congestive) and diastolic (congestive) heart failure: Secondary | ICD-10-CM | POA: Diagnosis not present

## 2014-12-24 DIAGNOSIS — I5043 Acute on chronic combined systolic (congestive) and diastolic (congestive) heart failure: Secondary | ICD-10-CM

## 2014-12-24 DIAGNOSIS — I5022 Chronic systolic (congestive) heart failure: Secondary | ICD-10-CM | POA: Diagnosis not present

## 2014-12-24 LAB — BASIC METABOLIC PANEL
ANION GAP: 7 (ref 5–15)
BUN: 12 mg/dL (ref 6–20)
CO2: 29 mmol/L (ref 22–32)
Calcium: 8.6 mg/dL — ABNORMAL LOW (ref 8.9–10.3)
Chloride: 106 mmol/L (ref 101–111)
Creatinine, Ser: 1.56 mg/dL — ABNORMAL HIGH (ref 0.61–1.24)
GFR calc Af Amer: 56 mL/min — ABNORMAL LOW (ref >60)
GFR calc non Af Amer: 49 mL/min — ABNORMAL LOW (ref >60)
GLUCOSE: 116 mg/dL — AB (ref 65–99)
Potassium: 4 mmol/L (ref 3.5–5.1)
SODIUM: 142 mmol/L (ref 135–145)

## 2014-12-29 ENCOUNTER — Encounter (HOSPITAL_COMMUNITY): Payer: Self-pay | Admitting: Cardiology

## 2015-01-01 ENCOUNTER — Ambulatory Visit (INDEPENDENT_AMBULATORY_CARE_PROVIDER_SITE_OTHER): Payer: Medicaid Other | Admitting: Internal Medicine

## 2015-01-01 ENCOUNTER — Encounter: Payer: Self-pay | Admitting: Internal Medicine

## 2015-01-01 VITALS — BP 117/71 | HR 92 | Temp 99.2°F | Ht 69.5 in | Wt 325.1 lb

## 2015-01-01 DIAGNOSIS — M1A9XX0 Chronic gout, unspecified, without tophus (tophi): Secondary | ICD-10-CM

## 2015-01-01 DIAGNOSIS — M1990 Unspecified osteoarthritis, unspecified site: Secondary | ICD-10-CM

## 2015-01-01 DIAGNOSIS — Z7901 Long term (current) use of anticoagulants: Secondary | ICD-10-CM | POA: Diagnosis not present

## 2015-01-01 DIAGNOSIS — M19079 Primary osteoarthritis, unspecified ankle and foot: Secondary | ICD-10-CM

## 2015-01-01 DIAGNOSIS — I428 Other cardiomyopathies: Secondary | ICD-10-CM | POA: Diagnosis not present

## 2015-01-01 DIAGNOSIS — M1000 Idiopathic gout, unspecified site: Secondary | ICD-10-CM

## 2015-01-01 DIAGNOSIS — I4892 Unspecified atrial flutter: Secondary | ICD-10-CM

## 2015-01-01 DIAGNOSIS — M25572 Pain in left ankle and joints of left foot: Secondary | ICD-10-CM

## 2015-01-01 MED ORDER — OXYCODONE-ACETAMINOPHEN 10-325 MG PO TABS: 1.0000 | ORAL_TABLET | Freq: Four times a day (QID) | ORAL | Status: DC | PRN

## 2015-01-01 NOTE — Progress Notes (Signed)
   Subjective:    Patient ID: Eric Murillo, male    DOB: [birth year], 54 y.o.   MRN: [id number]  HPI Tshombe Doolin is a 54 year old man with history of hypertension, hyperlipidemia, gout, chronic kidney disease, and nonischemic cardiac myopathy present for follow-up of his osteoarthritis/gout.  He saw our registered dietitian on 12/11/2014 for nutrition education and counseling about goal setting for weight loss. He set a goal to lose 5 pounds in the next 4 weeks.  He has lost 2 pounds since that time.  He reports cutting back his portions and juice intake and trying to reduce his fluid intake to 2L per day.  He reports that his ankle pain is a lot better, and the Percocet has been helping with his activity. However, he ran out of his Percocet approximately a week ago. He has been trying to go to the gym to ride a stationary bike or walk 5 times a week.  He followed up with heart failure clinic on 12/17/2014, and his torsemide was increased to 60 mg twice a day from 60 mg in the morning and 40 mg in the evening because his joints as was mildly elevated. His follow-up with EP was rescheduled for placement of an ICD. Dr. Gala Romney recommends an Optivol device to help with assessing volume status.  He reports that his shortness of breath is improved since then.  He denies any chest pain or palpitations. He reports that he has not yet undergone a fat pad biopsy, and this is scheduled for his next cardiology appointment.  Review of Systems  Constitutional: Negative for fever, chills and fatigue.  HENT: Negative for congestion, rhinorrhea and sore throat.   Respiratory: Negative for cough and shortness of breath.   Cardiovascular: Negative for chest pain and palpitations.  Gastrointestinal: Negative for nausea, vomiting, abdominal pain, diarrhea and constipation.  Genitourinary: Negative for dysuria and difficulty urinating.  Musculoskeletal: Negative for myalgias and arthralgias.  Skin: Negative for  rash.  Neurological: Negative for dizziness, weakness and numbness.       Objective:   Physical Exam  Constitutional: He is oriented to person, place, and time. He appears well-developed and well-nourished. No distress.  Very large, obese man.  HENT:  Head: Normocephalic and atraumatic.  Mouth/Throat: No oropharyngeal exudate.  Eyes: Conjunctivae and EOM are normal. Pupils are equal, round, and reactive to light.  Neck: Normal range of motion. Neck supple.  Cardiovascular: Normal rate, regular rhythm and normal heart sounds.   Pulmonary/Chest: Effort normal and breath sounds normal. No respiratory distress. He has no wheezes. He has no rales.  Abdominal: Soft. Bowel sounds are normal. He exhibits no distension. There is no tenderness.  Musculoskeletal: Normal range of motion. He exhibits edema (1+ pitting bilaterally). He exhibits no tenderness.  Neurological: He is alert and oriented to person, place, and time. No cranial nerve deficit. He exhibits normal muscle tone.  Skin: Skin is warm and dry.  A keratosis nigricans and skin tags on neck.          Assessment & Plan:  Please see problem-based assessment and plan.

## 2015-01-01 NOTE — Assessment & Plan Note (Addendum)
Currently denies pain in any of his joints or joint swelling. Reports that Dr. Lajoyce Corners checked his uric acid level, and he did not make any changes to his allopurinol dosing. -Continue allopurinol 100 mg twice a day. -Continue colchicine 0.6 mg twice a day when having a gout flare.

## 2015-01-01 NOTE — Progress Notes (Signed)
Internal Medicine Clinic Attending  Case discussed with Dr. Moding at the time of the visit.  We reviewed the resident's history and exam and pertinent patient test results.  I agree with the assessment, diagnosis, and plan of care documented in the resident's note. 

## 2015-01-01 NOTE — Assessment & Plan Note (Signed)
Patient remains in sinus rhythm, he is compliant with his Eliquis. -Continue Eliquis 5 mg twice a day.

## 2015-01-01 NOTE — Patient Instructions (Signed)
Thank you for coming to clinic today Eric Murillo.  General instructions: -Congratulation on losing 2 pounds since your last appointment! -Keep up the good work exercising and reducing your juice and fluid intake. -Make sure to follow up with cardiology regarding a possible ICD next week.  This is very important for you. -I refilled your Percocet.  Try to take it only 3 times per day to make it last a full month. -Please make a follow up appointment to return to clinic in 2 months.  Please bring your medicines with you each time you come.   Medicines may be  Eye drops  Herbal   Vitamins  Pills  Seeing these help [place] take care of you.

## 2015-01-01 NOTE — Assessment & Plan Note (Addendum)
The patient has made some progress towards weight loss since his previous appointment, and it appears that his Percocet prescription is allowing him to increase his activity. At this point, it appears that his diet and fluid intake is a major issue for his weight loss. -Applauded the patient on his weight loss. Encouraged further exercise and dietary changes. -Refilled Percocet 10-325 milligrams every 6 hours as needed, #90 tabs 2 months. -Checked urine drug screen (patient should be negative for opiates as he has not taken them in a week). -Follow-up with Lupita Leash for further nutritional counseling.

## 2015-01-01 NOTE — Assessment & Plan Note (Addendum)
Volume status has improved since going up on his torsemide. He continues to have some lower extremity edema, which is likely secondary to fluid intake. -Emphasized importance of restricting fluids to 2 L per day. -Follow-up with EP for repeat echo possible ICD placement next week. -Continue to follow with heart failure clinic who will schedule a fat pad biopsy. -Continue torsemide 60 mg twice a day. -Continue spironolactone 25 mg daily. -Continue carvedilol 25 mg twice a day. -Continue Klor-Con 20 mEq twice a day. -Continue BiDil 20-37.5 mg 3 times a day.

## 2015-01-02 LAB — PRESCRIPTION ABUSE MONITORING 15P, URINE
AMPHETAMINE/METH: NEGATIVE ng/mL
Barbiturate Screen, Urine: NEGATIVE ng/mL
Benzodiazepine Screen, Urine: NEGATIVE ng/mL
Buprenorphine, Urine: NEGATIVE ng/mL
CANNABINOID SCRN UR: NEGATIVE ng/mL
Carisoprodol, Urine: NEGATIVE ng/mL
Cocaine Metabolites: NEGATIVE ng/mL
Creatinine, Urine: 160.19 mg/dL (ref >20.0)
Fentanyl, Ur: NEGATIVE ng/mL
Meperidine, Ur: NEGATIVE ng/mL
Methadone Screen, Urine: NEGATIVE ng/mL
OXYCODONE SCRN UR: NEGATIVE ng/mL
Opiate Screen, Urine: NEGATIVE ng/mL
Propoxyphene: NEGATIVE ng/mL
Tramadol Scrn, Ur: NEGATIVE ng/mL
Zolpidem, Urine: NEGATIVE ng/mL

## 2015-01-05 ENCOUNTER — Ambulatory Visit (HOSPITAL_COMMUNITY): Payer: Medicaid Other | Attending: Cardiology

## 2015-01-05 ENCOUNTER — Other Ambulatory Visit (HOSPITAL_COMMUNITY): Payer: Medicaid Other

## 2015-01-05 ENCOUNTER — Encounter: Payer: Self-pay | Admitting: Internal Medicine

## 2015-01-05 ENCOUNTER — Ambulatory Visit (INDEPENDENT_AMBULATORY_CARE_PROVIDER_SITE_OTHER): Payer: Medicaid Other | Admitting: Internal Medicine

## 2015-01-05 ENCOUNTER — Other Ambulatory Visit: Payer: Self-pay

## 2015-01-05 VITALS — BP 120/80 | HR 88 | Ht 69.5 in | Wt 322.2 lb

## 2015-01-05 DIAGNOSIS — I5022 Chronic systolic (congestive) heart failure: Secondary | ICD-10-CM | POA: Diagnosis not present

## 2015-01-05 DIAGNOSIS — I429 Cardiomyopathy, unspecified: Secondary | ICD-10-CM

## 2015-01-05 DIAGNOSIS — I071 Rheumatic tricuspid insufficiency: Secondary | ICD-10-CM | POA: Diagnosis not present

## 2015-01-05 DIAGNOSIS — I10 Essential (primary) hypertension: Secondary | ICD-10-CM

## 2015-01-05 DIAGNOSIS — I517 Cardiomegaly: Secondary | ICD-10-CM | POA: Insufficient documentation

## 2015-01-05 DIAGNOSIS — I428 Other cardiomyopathies: Secondary | ICD-10-CM

## 2015-01-05 DIAGNOSIS — I340 Nonrheumatic mitral (valve) insufficiency: Secondary | ICD-10-CM | POA: Diagnosis not present

## 2015-01-05 DIAGNOSIS — G4730 Sleep apnea, unspecified: Secondary | ICD-10-CM

## 2015-01-05 DIAGNOSIS — I484 Atypical atrial flutter: Secondary | ICD-10-CM

## 2015-01-05 NOTE — Progress Notes (Signed)
ELECTROPHYSIOLOGY CONSULT NOTE  Patient ID: Eric Murillo, MRN: [id number], DOB/AGE: [birth year] 54 y.o. Admit date: (Not on file) Date of Consult: 01/05/2015  Primary Physician: Luisa Dago, MD Primary Cardiologist :CHF  Chief Complaint: ICD   HPI Eric Murillo is a 54 y.o. male   Seen back for consideration of an ICD.  He has a nonischemic cardiomyopathy.  He has had a catheterization demonstrating no obstructive coronary disease. This was originally diagnosed 8/14. He then developed atrial flutter 9/15 ejection fraction at that time was 25%.  He underwent cardioversion at that time. ECG was remarkable for extremely low voltage. He was to undergo fat pad biopsy. He underwent SPEP which showed no monoclonal spike.    He's been treated with apixaban.   Ejection fraction remains suppressed with echocardiogram 1/16 of 25-30%.  Repeat echo is pending  His functional status is only modestly limited. He is able to exercise mildly. He does not have followed with significant peripheral edema. He's had no palpitations. He has had no syncope.    Most recent laboratories included a creatinine of 1.78>>1.56  And  potassium level was normal. This was on Aldactone  Past Medical History  Diagnosis Date  . Hypertension   . Hyperlipidemia   . Chronic systolic congestive heart failure, NYHA class 2     EF 20-25% 1/16  . Organic erectile dysfunction   . Morbid obesity with BMI of 45.0-49.9, adult   . Gout     Of big toe  . CKD (chronic kidney disease) stage 3, GFR 30-59 ml/min   . Atrial flutter     DCCV 9/15  . Non-ischemic cardiomyopathy   . OSA (obstructive sleep apnea)   . Submandibular sialolithiasis     Right  . Pilonidal cyst   . Dyslipidemia   . Anemia of chronic disease   . Chest pain on exertion   . Hyperglycemia   . Arthritis       Surgical History:  Past Surgical History  Procedure Laterality Date  . Colonoscopy    . Salivary gland surgery  09/12/2012  .  Cardiac catheterization  2012  . Submandibular gland excision Right 09/12/2012    Procedure: Removal Right Submandibular Larina Bras;  Surgeon: Serena Colonel, MD;  Location: [facility] OR;  Service: ENT;  Laterality: Right;  . Tee without cardioversion N/A 04/03/2014    Procedure: TRANSESOPHAGEAL ECHOCARDIOGRAM (TEE);  Surgeon: Laurey Morale, MD;  Location: [facility] ENDOSCOPY;  Service: Cardiovascular;  Laterality: N/A;  . Cardioversion N/A 04/03/2014    Procedure: CARDIOVERSION;  Surgeon: Laurey Morale, MD;  Location: [facility] ENDOSCOPY;  Service: Cardiovascular;  Laterality: N/A;  . Left and right heart catheterization with coronary angiogram N/A 04/24/2014    Procedure: LEFT AND RIGHT HEART CATHETERIZATION WITH CORONARY ANGIOGRAM;  Surgeon: Laurey Morale, MD;  Location: [facility] CATH LAB;  Service: Cardiovascular;  Laterality: N/A;     Home Meds: Prior to Admission medications   Medication Sig Start Date End Date Taking? Authorizing Provider  allopurinol (ZYLOPRIM) 100 MG tablet Take 1 tablet (100 mg total) by mouth 2 (two) times daily. 07/28/14 07/28/15 Yes Luisa Dago, MD  apixaban (ELIQUIS) 5 MG TABS tablet Take 5 mg by mouth 2 (two) times daily.   Yes Historical Provider, MD  carvedilol (COREG) 25 MG tablet Take 1 tablet (25 mg total) by mouth 2 (two) times daily with a meal. 01/05/14  Yes Dolores Patty, MD  colchicine 0.6 MG tablet Take 1 tablet (0.6 mg total) by mouth  2 (two) times daily. 07/27/14  Yes Bevelyn Buckles Bensimhon, MD  isosorbide-hydrALAZINE (BIDIL) 20-37.5 MG per tablet Take 2 tablets by mouth 3 (three) times daily. 05/04/14  Yes Laurey Morale, MD  oxyCODONE-acetaminophen (PERCOCET) 10-325 MG per tablet Take 1 tablet by mouth every 6 (six) hours as needed for pain. 07/28/14  Yes Luisa Dago, MD  potassium chloride SA (K-DUR,KLOR-CON) 20 MEQ tablet Take 1 tablet (20 mEq total) by mouth 2 (two) times daily. 01/05/14  Yes Dolores Patty, MD  simvastatin (ZOCOR) 40 MG tablet Take 1 tablet (40 mg total) by  mouth at bedtime. 08/20/14  Yes Dolores Patty, MD  spironolactone (ALDACTONE) 25 MG tablet Take 1 tablet (25 mg total) by mouth daily. 01/05/14  Yes Dolores Patty, MD  torsemide (DEMADEX) 20 MG tablet TAKE 3 TABLETS EVERY MORNING AND 2 TABLETS IN THE EVENING 06/29/14  Yes Aundria Rud, NP  traMADol (ULTRAM) 50 MG tablet Take 1 tablet (50 mg total) by mouth every 6 (six) hours as needed. 06/10/14  Yes Fayrene Helper, PA-C      Allergies:  Allergies  Allergen Reactions  . Beta Adrenergic Blockers Other (See Comments)    REACTION: A white colored Beta Blocker made him feel funny.    History   Social History  . Marital Status: Single    Spouse Name: N/A  . Number of Children: N/A  . Years of Education: N/A   Occupational History  . Not on file.   Social History Main Topics  . Smoking status: Never Smoker   . Smokeless tobacco: Never Used  . Alcohol Use: No  . Drug Use: No  . Sexual Activity: Not on file   Other Topics Concern  . Not on file   Social History Narrative   Financial assistance approved for 100% discount at [facility] and has [facility] card   [employer]  March 16, 2010 9:42 AM      Lives with mother.  Has a girlfriend     Family History  Problem Relation Age of Onset  . Hypertension Mother   . Hypertension Father   . Cancer Father     brother died of brain cancer; sister died of bone cancer  . Diabetes Sister   . Diabetes Brother   . Colon cancer Maternal Aunt   . Cardiomyopathy Neg Hx      ROS:  Please see the history of present illness.     All other systems reviewed and negative.    Physical Exam: Blood pressure 120/80, pulse 88, height 5' 9.5" (1.765 m), weight 322 lb 3.2 oz (146.149 kg). General: Well developed, Morbidly obese AA  male in no acute distress. Head: Normocephalic, atraumatic, sclera non-icteric, no xanthomas, nares are without discharge. EENT: normal Lymph Nodes:  none Back: without scoliosis/kyphosis, no CVA tendersness Neck:  Negative for carotid bruits. JVD not elevated. Lungs: Clear bilaterally to auscultation without wheezes, rales, or rhonchi. Breathing is unlabored. Heart: RRR with S1 S2.  2/6 systolic murmur , rubs, or gallops appreciated. Abdomen: Soft, non-tender, non-distended with normoactive bowel sounds. No hepatomegaly. No rebound/guarding. No obvious abdominal masses. Msk:  Strength and tone appear normal for age. Extremities: No clubbing or cyanosis. Tr - 1+ edema.  Distal pedal pulses are 2+ and equal bilaterally. Skin: Warm and Dry Neuro: Alert and oriented X 3. CN III-XII intact Grossly normal sensory and motor function . Psych:  Responds to questions appropriately with a normal affect.      Labs:  Cardiac Enzymes No results for input(s): CKTOTAL, CKMB, TROPONINI in the last 72 hours. CBC Lab Results  Component Value Date   WBC 9.5 06/30/2014   HGB 11.3* 06/30/2014   HCT 35.1* 06/30/2014   MCV 82.2 06/30/2014   PLT 318 06/30/2014   PROTIME: No results for input(s): LABPROT, INR in the last 72 hours. Chemistry  No results for input(s): NA, K, CL, CO2, BUN, CREATININE, CALCIUM, PROT, BILITOT, ALKPHOS, ALT, AST, GLUCOSE in the last 168 hours.  Invalid input(s): LABALBU Lipids Lab Results  Component Value Date   CHOL 145 07/10/2013   HDL 31* 07/10/2013   LDLCALC 84 07/10/2013   TRIG 150* 07/10/2013   BNP PRO B NATRIURETIC PEPTIDE (BNP)  Date/Time Value Ref Range Status  04/20/2014 09:38 PM 1869.0* 0 - 125 pg/mL Final  04/14/2014 09:01 AM 814.3* 0 - 125 pg/mL Final  03/24/2014 10:25 AM 1979.0* 0 - 125 pg/mL Final  02/16/2014 09:10 AM 514.3* 0 - 125 pg/mL Final   Miscellaneous No results found for: DDIMER  Radiology/Studies:  No results found.  EKG:  Sinus rhythm with extremely low voltage  Rate is 88 intervals 25/11/40 Inferior Q waves, lateral Q waves.   Assessment and Plan:   Nonischemic cardiomyopathy  Congestive Heart Failure  Chronic systolic  Morbidly  obese  Atrial flutter  Low-voltage  Mr. Tinkham is a reasonable candidate for consideration of ICD implantation for primary prevention in the context of nonischemic cardiomyopathy and class II congestive heart failure. I have estimated that he would have a 2% or so  annual benefit which would accrue over time.  He is more inclined at this point to pursue ICD implantation; however, we are left in abeyance because of the issue of amyloid. Data from [facility] would suggest that ICD implantation in patients with amyloid are most affective and probably confined to improve with nonsustained ventricular tachycardia. Hence, we will ask his primary cardiac team to arrange for a fat pad biopsy not withstanding the negative SPEP and we will proceed from there.  His echocardiogram done this morning remains pending.  He has no medication questions and has been compliant with his medications    Sherryl Manges

## 2015-01-05 NOTE — Progress Notes (Signed)
Not sure how the fat pad biopsy fell through, looks like we had tried to arrange a while back but never done.  Will try to reschedule.  Thanks.   Megan or Heather: Could you please set up Mr Eric Murillo for fat pad biopsy? Thanks.

## 2015-01-05 NOTE — Patient Instructions (Signed)
Medication Instructions:  Your physician recommends that you continue on your current medications as directed. Please refer to the Current Medication list given to you today.   Labwork: NONE  Testing/Procedures: NONE  Follow-Up: We will call you by the first week of July to setup follow up appointment.   Any Other Special Instructions Will Be Listed Below (If Applicable).

## 2015-01-06 ENCOUNTER — Telehealth (HOSPITAL_COMMUNITY): Payer: Self-pay | Admitting: Cardiology

## 2015-01-06 ENCOUNTER — Encounter (HOSPITAL_COMMUNITY): Payer: Self-pay | Admitting: Cardiology

## 2015-01-06 DIAGNOSIS — I5022 Chronic systolic (congestive) heart failure: Secondary | ICD-10-CM

## 2015-01-06 NOTE — Telephone Encounter (Signed)
Per Dr.McLean pt should have fat pad bx done Order placed and appt scheduled

## 2015-01-14 ENCOUNTER — Other Ambulatory Visit: Payer: Self-pay | Admitting: Radiology

## 2015-01-15 ENCOUNTER — Ambulatory Visit (HOSPITAL_COMMUNITY)
Admission: RE | Admit: 2015-01-15 | Discharge: 2015-01-15 | Disposition: A | Discharge status: Home / Self Care | Payer: Medicaid Other | Source: Ambulatory Visit | Attending: Cardiology | Admitting: Cardiology

## 2015-01-15 ENCOUNTER — Encounter (HOSPITAL_COMMUNITY): Payer: Self-pay

## 2015-01-15 DIAGNOSIS — I129 Hypertensive chronic kidney disease with stage 1 through stage 4 chronic kidney disease, or unspecified chronic kidney disease: Secondary | ICD-10-CM | POA: Diagnosis not present

## 2015-01-15 DIAGNOSIS — I4892 Unspecified atrial flutter: Secondary | ICD-10-CM | POA: Insufficient documentation

## 2015-01-15 DIAGNOSIS — Z7902 Long term (current) use of antithrombotics/antiplatelets: Secondary | ICD-10-CM | POA: Diagnosis not present

## 2015-01-15 DIAGNOSIS — I5022 Chronic systolic (congestive) heart failure: Secondary | ICD-10-CM | POA: Insufficient documentation

## 2015-01-15 DIAGNOSIS — G4733 Obstructive sleep apnea (adult) (pediatric): Secondary | ICD-10-CM | POA: Diagnosis not present

## 2015-01-15 DIAGNOSIS — E785 Hyperlipidemia, unspecified: Secondary | ICD-10-CM | POA: Diagnosis not present

## 2015-01-15 DIAGNOSIS — M189 Osteoarthritis of first carpometacarpal joint, unspecified: Secondary | ICD-10-CM | POA: Diagnosis not present

## 2015-01-15 LAB — CBC
HEMATOCRIT: 38 % — AB (ref 39.0–52.0)
Hemoglobin: 12.4 g/dL — ABNORMAL LOW (ref 13.0–17.0)
MCH: 28.3 pg (ref 26.0–34.0)
MCHC: 32.6 g/dL (ref 30.0–36.0)
MCV: 86.8 fL (ref 78.0–100.0)
PLATELETS: 207 10*3/uL (ref 150–400)
RBC: 4.38 MIL/uL (ref 4.22–5.81)
RDW: 13.4 % (ref 11.5–15.5)
WBC: 8.2 10*3/uL (ref 4.0–10.5)

## 2015-01-15 LAB — PROTIME-INR
INR: 1.26 (ref 0.00–1.49)
Prothrombin Time: 15.9 seconds — ABNORMAL HIGH (ref 11.6–15.2)

## 2015-01-15 LAB — APTT: aPTT: 29 seconds (ref 24–37)

## 2015-01-15 MED ORDER — MIDAZOLAM HCL 2 MG/2ML IJ SOLN
INTRAMUSCULAR | Status: AC
  Filled 2015-01-15: qty 2

## 2015-01-15 MED ORDER — SODIUM CHLORIDE 0.9 % IV SOLN
INTRAVENOUS | Status: DC
  Administered 2015-01-15: 09:00:00 via INTRAVENOUS

## 2015-01-15 MED ORDER — FENTANYL CITRATE (PF) 100 MCG/2ML IJ SOLN
INTRAMUSCULAR | Status: AC
  Filled 2015-01-15: qty 2

## 2015-01-15 MED ORDER — LIDOCAINE HCL (PF) 1 % IJ SOLN
INTRAMUSCULAR | Status: AC
  Filled 2015-01-15: qty 10

## 2015-01-15 MED ORDER — FENTANYL CITRATE (PF) 100 MCG/2ML IJ SOLN
INTRAMUSCULAR | Status: AC | PRN
  Administered 2015-01-15: 50 ug via INTRAVENOUS

## 2015-01-15 MED ORDER — MIDAZOLAM HCL 2 MG/2ML IJ SOLN
INTRAMUSCULAR | Status: AC | PRN
  Administered 2015-01-15: 1 mg via INTRAVENOUS

## 2015-01-15 NOTE — H&P (Signed)
   HPI: Patient is scheduled today for an image guided fat pad biopsy. He has been seen by Cardiology on 01/05/15.  The patient has had a H&P performed within the last 30 days, all history, medications, and exam have been reviewed. The patient denies any interval changes since the H&P.  Vital Signs: Pulse 86  Temp(Src) 98.6 F (37 C) (Oral)  Resp 18  Ht   SpO2 99%  Physical Exam  Constitutional: He is oriented to person, place, and time. He appears well-developed and well-nourished. No distress.  HENT:  Head: Normocephalic and atraumatic.  Neck: No tracheal deviation present.  Cardiovascular: Normal rate and regular rhythm.  Exam reveals no gallop and no friction rub.   Murmur heard. Pulmonary/Chest: Effort normal and breath sounds normal. No respiratory distress. He has no wheezes. He has no rales.  Abdominal: Soft. Bowel sounds are normal. He exhibits no distension. There is no tenderness.  Neurological: He is alert and oriented to person, place, and time.  Skin: He is not diaphoretic.  Psychiatric: He has a normal mood and affect. His behavior is normal. Thought content normal.   Mallampati Score:  MD Evaluation Airway: WNL Heart: WNL Abdomen: WNL Chest/ Lungs: WNL ASA  Classification: 3 Mallampati/Airway Score: Two  Labs:  CBC:  Recent Labs  04/24/14 0500 04/25/14 0530 06/30/14 1022 01/15/15 0845  WBC 13.4* 12.9* 9.5 8.2  HGB 11.0* 11.3* 11.3* 12.4*  HCT 35.3* 35.7* 35.1* 38.0*  PLT 289 325 318 207    COAGS:  Recent Labs  04/23/14 0604  04/25/14 0530 04/26/14 0252 04/27/14 0410 01/15/15 0845  INR 1.33  --   --   --   --  1.26  APTT 40*  < > 35 34 36 29  < > = values in this interval not displayed.  BMP:  Recent Labs  06/04/14 0930 06/29/14 1444 08/21/14 1057 12/24/14 1057  NA 142 143 141 142  K 4.4 4.0 3.9 4.0  CL 102 101 104 106  CO2 26 27 33* 29  GLUCOSE 114* 94 107* 116*  BUN 19 28* 20 12  CALCIUM 9.4 9.8 9.5 8.6*  CREATININE 2.19*  1.96* 1.78* 1.56*  GFRNONAA 33* 37* 42* 49*  GFRAA 38* 43* 49* 56*    LIVER FUNCTION TESTS:  Recent Labs  04/20/14 2138 09/25/14 1653  BILITOT 0.3  --   AST 22  --   ALT 20  --   ALKPHOS 77  --   PROT 8.1 7.2  ALBUMIN 3.8  --     Assessment/Plan:  NICM EF 30-35% 12/2014 Chronic systolic CHF Atrial flutter on Eliquis-Held 2 days, s/p DC-CV 03/2014 HTN HLP CKD Morbidly obese  OSA, intermittently compliant with CPAP Seen by Cardiology, concern for Amyloid-no M spike Scheduled today for image guided fat pad biopsy with sedation The patient has been NPO, no blood thinners taken, labs and vitals have been reviewed. Risks and Benefits discussed with the patient including, but not limited to bleeding, infection, damage to adjacent structures or low yield requiring additional tests. All of the patient's questions were answered, patient is agreeable to proceed. Consent signed and in chart.   SignedBerneta Levins 01/15/2015, 9:38 AM

## 2015-01-15 NOTE — Procedures (Signed)
Technically successful US guided biopsy of anterior abd wall fat pad.  No immediate complications.

## 2015-01-15 NOTE — Sedation Documentation (Signed)
Patient denies pain and is resting comfortably.  

## 2015-01-15 NOTE — Discharge Instructions (Signed)
Biopsy Care After Refer to this sheet in the next few weeks. These instructions provide you with information on caring for yourself after your procedure. Your caregiver may also give you more specific instructions. Your treatment has been planned according to current medical practices, but problems sometimes occur. Call your caregiver if you have any problems or questions after your procedure. If you had a fine needle biopsy, you may have soreness at the biopsy site for 1 to 2 days. If you had an open biopsy, you may have soreness at the biopsy site for 3 to 4 days. HOME CARE INSTRUCTIONS   You may resume normal diet and activities as directed.  Change bandages (dressings) as directed. If your wound was closed with a skin glue (adhesive), it will wear off and begin to peel in 7 days.  Only take over-the-counter or prescription medicines for pain, discomfort, or fever as directed by your caregiver.  Ask your caregiver when you can bathe and get your wound wet. SEEK IMMEDIATE MEDICAL CARE IF:   You have increased bleeding (more than a small spot) from the biopsy site.  You notice redness, swelling, or increasing pain at the biopsy site.  You have pus coming from the biopsy site.  You have a fever.  You notice a bad smell coming from the biopsy site or dressing.  You have a rash, have difficulty breathing, or have any allergic problems. MAKE SURE YOU:   Understand these instructions.  Will watch your condition.  Will get help right away if you are not doing well or get worse. Document Released: 01/27/2005 Document Revised: 10/02/2011 Document Reviewed: 01/05/2011 ExitCare Patient Information 2015 [employer]. This information is not intended to replace advice given to you by your health care provider. Make sure you discuss any questions you have with your health care provider.  

## 2015-01-18 ENCOUNTER — Telehealth: Payer: Self-pay | Admitting: Internal Medicine

## 2015-01-18 NOTE — Telephone Encounter (Signed)
Spoke with pt and informed him of echo results. Pt verbalized understanding.  

## 2015-01-18 NOTE — Telephone Encounter (Signed)
New problem   Pt calling for test results for his [place] he had on 6.24.16. Please call pt.

## 2015-01-21 ENCOUNTER — Other Ambulatory Visit (HOSPITAL_COMMUNITY): Payer: Self-pay | Admitting: Cardiology

## 2015-01-22 ENCOUNTER — Telehealth (HOSPITAL_COMMUNITY): Payer: Self-pay | Admitting: Vascular Surgery

## 2015-01-22 NOTE — Telephone Encounter (Signed)
Pt called he would like the results form his biopsy .Marland Kitchen Please advise

## 2015-01-22 NOTE — Telephone Encounter (Signed)
Results of recent fat pad biopsy reviewed with patient.

## 2015-02-10 ENCOUNTER — Other Ambulatory Visit (HOSPITAL_COMMUNITY): Payer: Self-pay | Admitting: Cardiology

## 2015-02-11 ENCOUNTER — Other Ambulatory Visit: Payer: Self-pay

## 2015-02-11 DIAGNOSIS — I5043 Acute on chronic combined systolic (congestive) and diastolic (congestive) heart failure: Secondary | ICD-10-CM

## 2015-02-11 MED ORDER — ISOSORB DINITRATE-HYDRALAZINE 20-37.5 MG PO TABS: 2.0000 | ORAL_TABLET | Freq: Three times a day (TID) | ORAL | Status: DC

## 2015-02-23 ENCOUNTER — Other Ambulatory Visit: Payer: Self-pay | Admitting: Internal Medicine

## 2015-02-23 ENCOUNTER — Telehealth: Payer: Self-pay | Admitting: Internal Medicine

## 2015-02-23 DIAGNOSIS — M25572 Pain in left ankle and joints of left foot: Secondary | ICD-10-CM

## 2015-02-23 NOTE — Telephone Encounter (Signed)
Pt requesting pain medicine to be filled @ [facility].

## 2015-02-23 NOTE — Telephone Encounter (Signed)
Informed patient that I would review with Dr. Graciela Husbands and let him know plan. Explained that he would need OV before scheduling ICD. Patient verbalized understanding and agreeable to plan.

## 2015-02-23 NOTE — Telephone Encounter (Signed)
Pt # [id number]  Last filled 7/8 per pharmacy  Last OV scheduled appointment 8/17 with PCP

## 2015-02-23 NOTE — Telephone Encounter (Signed)
Returned call to pt and he is not at home.  Unable to fill without name of med.

## 2015-02-23 NOTE — Telephone Encounter (Signed)
New Message  Pt calling to speak w/ RN concerning scheduling his device implantation. Please call back and discuss.

## 2015-02-26 MED ORDER — OXYCODONE-ACETAMINOPHEN 10-325 MG PO TABS: 1.0000 | ORAL_TABLET | Freq: Four times a day (QID) | ORAL | Status: DC | PRN

## 2015-03-09 ENCOUNTER — Telehealth: Payer: Self-pay | Admitting: Internal Medicine

## 2015-03-09 NOTE — Telephone Encounter (Signed)
New Message         Pt calling to speak to Sherri to schedule procedure for implanted device. Please call back and advise.

## 2015-03-09 NOTE — Telephone Encounter (Signed)
Left message with family member that I would call back on Thursday

## 2015-03-10 ENCOUNTER — Ambulatory Visit (INDEPENDENT_AMBULATORY_CARE_PROVIDER_SITE_OTHER): Payer: Medicaid Other | Admitting: Internal Medicine

## 2015-03-10 ENCOUNTER — Encounter: Payer: Self-pay | Admitting: Internal Medicine

## 2015-03-10 VITALS — BP 123/79 | HR 98 | Temp 98.0°F | Ht 69.5 in | Wt 326.4 lb

## 2015-03-10 DIAGNOSIS — I428 Other cardiomyopathies: Secondary | ICD-10-CM | POA: Diagnosis not present

## 2015-03-10 DIAGNOSIS — I5043 Acute on chronic combined systolic (congestive) and diastolic (congestive) heart failure: Secondary | ICD-10-CM | POA: Diagnosis not present

## 2015-03-10 DIAGNOSIS — Z0000 Encounter for general adult medical examination without abnormal findings: Secondary | ICD-10-CM | POA: Insufficient documentation

## 2015-03-10 NOTE — Assessment & Plan Note (Signed)
Patient states he has occasional swelling, likely due to fluid intake. He states that he is trying to limit his fluids during the day. He had fat pad biopsy which showed benign fibroadipose tissue. -Follow up with cardiology for ICD placement evaluation. -Continue BiDil 20-37.5 mg tid -Continue Klor-Con 20 mEq bid -Continue Carvedilol 25 mg bid -Continue spirinolactone 25 mg daily -Continue torsemide 60 mg bid

## 2015-03-10 NOTE — Progress Notes (Signed)
Patient ID: Mar Walmer, male   DOB: July 07, 1961, 54 y.o.   MRN: [id number]   Subjective:   Patient ID: Adrienne Delay male   DOB: [birth year] 54 y.o.   MRN: [id number]  HPI: Mr.Keller Vandenberghe is a 54 y.o. gentleman with PMH as listed below who presents for a regular check up. He has no current complaints.  Please see problem list for details.  Past Medical History  Diagnosis Date  . Hypertension   . Hyperlipidemia   . Chronic systolic congestive heart failure, NYHA class 2     EF 20-25% 1/16  . Organic erectile dysfunction   . Morbid obesity with BMI of 45.0-49.9, adult   . Gout     Of big toe  . CKD (chronic kidney disease) stage 3, GFR 30-59 ml/min   . Atrial flutter     DCCV 9/15  . Non-ischemic cardiomyopathy   . OSA (obstructive sleep apnea)   . Submandibular sialolithiasis     Right  . Pilonidal cyst   . Dyslipidemia   . Anemia of chronic disease   . Chest pain on exertion   . Hyperglycemia   . Arthritis    Current Outpatient Prescriptions  Medication Sig Dispense Refill  . allopurinol (ZYLOPRIM) 100 MG tablet Take 1 tablet (100 mg total) by mouth 2 (two) times daily. 60 tablet 2  . carvedilol (COREG) 25 MG tablet TAKE 1 TABLET BY MOUTH TWICE A DAY WITH A MEAL 60 tablet 6  . colchicine 0.6 MG tablet Take 1 tablet (0.6 mg total) by mouth 2 (two) times daily. 60 tablet 0  . ELIQUIS 5 MG TABS tablet TAKE 1 TABLET BY MOUTH TWICE A DAY 60 tablet 6  . isosorbide-hydrALAZINE (BIDIL) 20-37.5 MG per tablet Take 2 tablets by mouth 3 (three) times daily. 180 tablet 3  . KLOR-CON M20 20 MEQ tablet TAKE 1 TABLET BY MOUTH TWICE A DAY 60 tablet 3  . oxyCODONE-acetaminophen (PERCOCET) 10-325 MG per tablet Take 1 tablet by mouth every 6 (six) hours as needed for pain. 90 tablet 0  . simvastatin (ZOCOR) 40 MG tablet TAKE 1 TABLET (40 MG TOTAL) BY MOUTH AT BEDTIME. 30 tablet 2  . spironolactone (ALDACTONE) 25 MG tablet Take 1 tablet (25 mg total) by mouth daily. 30 tablet 6  .  torsemide (DEMADEX) 20 MG tablet Take 3 tablets (60 mg total) by mouth 2 (two) times daily. 150 tablet 1  . torsemide (DEMADEX) 20 MG tablet TAKE 3 TABLETS BY MOUTH TWICE A DAY 180 tablet 3   No current facility-administered medications for this visit.   Family History  Problem Relation Age of Onset  . Hypertension Mother   . Hypertension Father   . Cancer Father     brother died of brain cancer; sister died of bone cancer  . Diabetes Sister   . Diabetes Brother   . Colon cancer Maternal Aunt   . Cardiomyopathy Neg Hx    Social History   Social History  . Marital Status: Single    Spouse Name: N/A  . Number of Children: N/A  . Years of Education: N/A   Social History Main Topics  . Smoking status: Never Smoker   . Smokeless tobacco: Never Used  . Alcohol Use: No  . Drug Use: No  . Sexual Activity: Not Asked   Other Topics Concern  . None   Social History Narrative   Financial assistance approved for 100% discount at [facility] and has [employer] card   [employer]  [place]  March 16, 2010 9:42 AM      Lives with mother.  Has a girlfriend   Review of Systems: Review of Systems  Constitutional: Negative for fever and chills.  Respiratory: Negative for cough.        Occasional SOB  Cardiovascular: Positive for leg swelling. Negative for chest pain.       Occasional palpatations  Gastrointestinal: Negative for nausea, vomiting, abdominal pain, diarrhea and constipation.  Genitourinary: Negative for dysuria.  Musculoskeletal: Negative for myalgias and joint pain.  Neurological: Negative for headaches.    Objective:  Physical Exam: Filed Vitals:   03/10/15 1359  BP: 123/79  Pulse: 98  Temp: 98 F (36.7 C)  TempSrc: Oral  Height: 5' 9.5" (1.765 m)  Weight: 326 lb 6.4 oz (148.054 kg)  SpO2: 95%   Physical Exam  Constitutional: He is oriented to person, place, and time. He appears well-developed and well-nourished.  HENT:  Head: Normocephalic and atraumatic.  Eyes: EOM are  normal.  Cardiovascular: An irregularly irregular rhythm present.  No murmur heard. Rate 98  Pulmonary/Chest: Effort normal and breath sounds normal. He has no wheezes.  Abdominal: Soft. He exhibits no distension. There is no tenderness. There is no guarding.  Neurological: He is alert and oriented to person, place, and time.  Skin: Skin is warm.  Psychiatric: He has a normal mood and affect.    Assessment & Plan:  Please see problem based charting.

## 2015-03-10 NOTE — Patient Instructions (Signed)
Thank you for visiting the clinic Eric Murillo, it was good to meet you.  It sounds like you are doing well. Try to call your cardiologist for a follow up appointment and evaluation for the ICD placement.  Also, remember to get a flu shot in the next 1-2 months if you are able.  We will follow up with you in 3 months.

## 2015-03-11 NOTE — Assessment & Plan Note (Addendum)
Discussed with patient on regular health screening for Hepatitis C as well as receiving an influenza vaccine in the next 1-2 months. He declines Hepatitis C screening but states that he will "probably" take a flu shot. -Influenza vaccine on f/u if not done before.

## 2015-03-11 NOTE — Assessment & Plan Note (Signed)
Patient denies any chest pain but does endorse occasional shortness of breath and some swelling in the legs. He states that he doubles his torsemide when he feels his swelling is worse, which brings his edema down. He is to follow up with cardiology for evaluation on placement of ICD. -Continue with Cardiology recommendations

## 2015-03-12 NOTE — Telephone Encounter (Signed)
Left another message with family member.  Also called cell phone listed, but no answer/no voicemail. Informed family member to have patient call me back today.  If not, informed that I will be out of the office until end of the month and will call him back then if I do not hear from him today.

## 2015-03-15 NOTE — Telephone Encounter (Signed)
Patient called. Informed him that Roanna Raider would return his call when she comes back next week. Patient verbalized understanding.

## 2015-03-15 NOTE — Telephone Encounter (Signed)
F/u ° ° °Pt returning call. °

## 2015-03-18 NOTE — Progress Notes (Signed)
Internal Medicine Clinic Attending  I saw and evaluated the patient.  I personally confirmed the key portions of the history and exam documented by Dr. Patel,Vishal and I reviewed pertinent patient test results.  The assessment, diagnosis, and plan were formulated together and I agree with the documentation in the resident's note.  

## 2015-03-26 NOTE — Telephone Encounter (Signed)
lmtcb

## 2015-03-30 ENCOUNTER — Encounter: Payer: Self-pay | Admitting: Internal Medicine

## 2015-03-30 ENCOUNTER — Other Ambulatory Visit: Payer: Self-pay | Admitting: Internal Medicine

## 2015-03-30 DIAGNOSIS — M25572 Pain in left ankle and joints of left foot: Secondary | ICD-10-CM

## 2015-03-30 NOTE — Telephone Encounter (Signed)
Pt called requesting hydrocodone to be filled.  °

## 2015-03-30 NOTE — Telephone Encounter (Signed)
Last office visit 03/10/2015 Next appt 06/09/2015 Last UDS 01/01/2015 Last filled at pharm 01/29/2015

## 2015-03-30 NOTE — Telephone Encounter (Signed)
New Message  Pt returning Sherri's phone call from 9/2. Please call back and discuss.

## 2015-03-31 MED ORDER — OXYCODONE-ACETAMINOPHEN 10-325 MG PO TABS: 1.0000 | ORAL_TABLET | Freq: Four times a day (QID) | ORAL | Status: DC | PRN

## 2015-03-31 NOTE — Telephone Encounter (Signed)
This encounter was created in error - please disregard.

## 2015-03-31 NOTE — Telephone Encounter (Signed)
lmtcb  Asked family member to have patient page me in office so that we don't play anymore "phonetag".

## 2015-03-31 NOTE — Telephone Encounter (Signed)
Call Documentation      Ted Mcalpine Hatteberg at 03/30/2015 11:50 AM     Status: Signed       Expand All Collapse All   New Message  Pt returning Lakita Sahlin's phone call from 9/2. Please call back and discuss.

## 2015-03-31 NOTE — Telephone Encounter (Signed)
Pt informed

## 2015-04-01 ENCOUNTER — Encounter: Payer: Self-pay | Admitting: Internal Medicine

## 2015-04-01 NOTE — Telephone Encounter (Signed)
This encounter was created in error - please disregard.

## 2015-04-01 NOTE — Telephone Encounter (Signed)
Call Documentation      Eric Murillo at 04/01/2015 8:40 AM     Status: Signed       Expand All Collapse All   Follow up   Pt is requesting to apeak to RN  Per last notes i overhead paged for you but i didn't get a answer  Please call pt thank you

## 2015-04-01 NOTE — Telephone Encounter (Signed)
Follow up   Pt is requesting to apeak to RN  Per last notes i overhead paged for you but i didn't get a answer  Please call pt thank you

## 2015-04-01 NOTE — Telephone Encounter (Signed)
Pt would like to have ICD implant on 10/28. We agreed to speak in the next week or two to discuss/review instructions.

## 2015-04-07 ENCOUNTER — Encounter: Payer: Self-pay | Admitting: Internal Medicine

## 2015-04-07 DIAGNOSIS — Z7901 Long term (current) use of anticoagulants: Secondary | ICD-10-CM | POA: Insufficient documentation

## 2015-04-15 ENCOUNTER — Encounter (HOSPITAL_COMMUNITY): Payer: Self-pay

## 2015-04-15 ENCOUNTER — Ambulatory Visit (HOSPITAL_COMMUNITY)
Admission: RE | Admit: 2015-04-15 | Discharge: 2015-04-15 | Disposition: A | Discharge status: Home / Self Care | Payer: Medicaid Other | Source: Ambulatory Visit | Attending: Internal Medicine | Admitting: Internal Medicine

## 2015-04-15 VITALS — BP 110/84 | HR 93 | Wt 326.0 lb

## 2015-04-15 DIAGNOSIS — Z7902 Long term (current) use of antithrombotics/antiplatelets: Secondary | ICD-10-CM | POA: Insufficient documentation

## 2015-04-15 DIAGNOSIS — I484 Atypical atrial flutter: Secondary | ICD-10-CM | POA: Insufficient documentation

## 2015-04-15 DIAGNOSIS — I4892 Unspecified atrial flutter: Secondary | ICD-10-CM | POA: Diagnosis not present

## 2015-04-15 DIAGNOSIS — Z6841 Body Mass Index (BMI) 40.0 and over, adult: Secondary | ICD-10-CM | POA: Diagnosis not present

## 2015-04-15 DIAGNOSIS — N189 Chronic kidney disease, unspecified: Secondary | ICD-10-CM | POA: Diagnosis not present

## 2015-04-15 DIAGNOSIS — M109 Gout, unspecified: Secondary | ICD-10-CM | POA: Diagnosis not present

## 2015-04-15 DIAGNOSIS — I5022 Chronic systolic (congestive) heart failure: Secondary | ICD-10-CM | POA: Diagnosis present

## 2015-04-15 DIAGNOSIS — I129 Hypertensive chronic kidney disease with stage 1 through stage 4 chronic kidney disease, or unspecified chronic kidney disease: Secondary | ICD-10-CM | POA: Insufficient documentation

## 2015-04-15 DIAGNOSIS — E785 Hyperlipidemia, unspecified: Secondary | ICD-10-CM | POA: Diagnosis not present

## 2015-04-15 DIAGNOSIS — Z79899 Other long term (current) drug therapy: Secondary | ICD-10-CM | POA: Diagnosis not present

## 2015-04-15 DIAGNOSIS — G4733 Obstructive sleep apnea (adult) (pediatric): Secondary | ICD-10-CM | POA: Diagnosis not present

## 2015-04-15 DIAGNOSIS — I428 Other cardiomyopathies: Secondary | ICD-10-CM | POA: Diagnosis not present

## 2015-04-15 LAB — BASIC METABOLIC PANEL
Anion gap: 9 (ref 5–15)
BUN: 15 mg/dL (ref 6–20)
CALCIUM: 9.3 mg/dL (ref 8.9–10.3)
CO2: 29 mmol/L (ref 22–32)
CREATININE: 1.74 mg/dL — AB (ref 0.61–1.24)
Chloride: 105 mmol/L (ref 101–111)
GFR calc Af Amer: 50 mL/min — ABNORMAL LOW (ref >60)
GFR calc non Af Amer: 43 mL/min — ABNORMAL LOW (ref >60)
GLUCOSE: 121 mg/dL — AB (ref 65–99)
Potassium: 4 mmol/L (ref 3.5–5.1)
Sodium: 143 mmol/L (ref 135–145)

## 2015-04-15 MED ORDER — TORSEMIDE 20 MG PO TABS: ORAL_TABLET | ORAL | Status: DC

## 2015-04-15 NOTE — Addendum Note (Signed)
Encounter addended by: Noralee Space, RN on: 04/15/2015 11:11 AM<BR>     Documentation filed: Dx Association, Patient Instructions Section, Orders

## 2015-04-15 NOTE — Progress Notes (Signed)
ADVANCED HF CLINIC NOTE  Patient ID: Eric Murillo, male   DOB: [birth year], 54 y.o.   MRN: [id number] PCP: Dr. Glenard Haring HF: [place]  54 yo with history of morbid obesity, hyperlipidemia, HTN, atrial flutter (s/p TEE DC-CV 04/03/14), NICM, chronic systolic HF and severe OSA but unable to tolerate CPAP.     He was admitted in 04/2014 with increased exertional dyspnea and chest tightness.  RHC/LHC showed elevated filling pressures, preserved cardiac output, and no significant coronary disease.  He was diuresed and discharged home.  Weight is up 4 lbs.  TEE (9/15) with EF 25-30% with global hypokinesis, mild to moderate MR, mildly decreased RV systolic function.   Has been undergoing work-up for possible amyloid. SPEP negative. Fat pad biopsy 6/16 with benign adipose tissue. Unable to get cMRI due to size.  He returns for follow up. Overall feeling ok.  But over past few days more SOB and tightness in chest. Feels like he has fluid on board. Denies PND/Orthopnea.  Weight at home up to 320-324 pounds.eating and drinking a lot.  Using CPAP rarely.  Taking all medications. No bleeding problems. Drinking > 2 liters per day. Saw Dr. Graciela Husbands in 6/16 he was waiting for results of fat pad biopy before addressing ICD now scheduled for October 28. At last visit torsemide increased to 60 bid but now taking 60/40  Echo 6/16 EF 30-35% RV ok   Labs (5/14): K 3.7, creatinine 1.5 Labs (12/14): K 4.2, creatinine 1.27, LDL 84, HDL 31, TSH normal Labs (3/15): K 4.6, creatinine 1.29 Labs (10/07/13) K 3.9 Creatinine 1.44 Labs (11/14/13) K 4.2, creatinine 1.48 Labs (12/05/13) K 4.8, creatinine 2.01  Labs (12/2013): K 4.3, creatinine 1.85, BUN 36, pro-BNP 884 Labs (7/15): K 3.5, creatinine 1.53 Labs (9/15): K 4, creatinine 1.9.   Labs (10/15): K 4.3, creatinine 1.7 Labs (11/15): K 4.4, creatinine 2.19 Labs (06/29/2014) K 4.0 Creatinine 1.96  Labs (08/21/2014) K 3.9 Creatinine 1.78  Past Medical History: 1. Nonischemic  cardiomyopathy: Echo (3/12) with EF 30-35% and mildly dilated LV, diffuse LV hypokinesis, moderate MR, PA systolic pressure 55 mmHg.  Left and right heart cath (3/12): No angiographic CAD; mean RA 20, PA 76/41, mean PCWP 38, CI 2.1.  ANA, SPEP, and HIV negative.  TSH normal.  Denies drug abuse, heavy ETOH intake.  No family history of cardiomyopathy.  Cardiomyopathy may be due to long-standing HTN.  Echo (6/13): EF 35-40%, moderate LV dilation, moderate to severe LAE, PA systolic pressure 52 mmHg.  Unable to fit in magnet for cardiac MRI.  Echo (8/14) with EF 45%, moderately dilated LV, mild LVH, normal RV, PA systolic pressure 49 mmHg. TEE (9/15) with EF 25-30% with global hypokinesis, mild to moderate MR, mildly decreased RV systolic function. LHC/RHC (10/15) with no significant coronary disease; mean RA 14, PA 65/25 mean 42, unable to obtain PCWP but LVEDP 24, CI 2.69.   2. ERECTILE DYSFUNCTION 3. HYPERTENSION  4. CKD 5. DYSLIPIDEMIA  6. Obesity 7. Hyperlipidemia 8. Gout 9. OSA: Severe on 6/13 sleep study.  10. Sialolithiasis 11. Atypical Atrial Flutter: TEE-DC-CV (04/03/14) which was successful;    Family History: No history of cardiomyopathy No history of cancer among first degree relatives father - died of MI (62s)  mother - HTN  Social History: Lives girlfriend.  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 tobacco - none alcohol - none drugs - none  ROS: All systems reviewed and negative except as per HPI.   Current Outpatient Prescriptions  Medication Sig Dispense Refill  . allopurinol (ZYLOPRIM) 100 MG tablet Take 1 tablet (100 mg total) by mouth 2 (two) times daily. 60 tablet 2  . carvedilol (COREG) 25 MG tablet TAKE 1 TABLET BY MOUTH TWICE A DAY WITH A MEAL 60 tablet 6  . colchicine 0.6 MG tablet Take 1 tablet (0.6 mg total) by mouth 2 (two) times daily. 60 tablet 0  . ELIQUIS 5 MG TABS tablet TAKE 1 TABLET BY MOUTH TWICE A DAY 60 tablet 6  . isosorbide-hydrALAZINE  (BIDIL) 20-37.5 MG per tablet Take 2 tablets by mouth 3 (three) times daily. 180 tablet 3  . KLOR-CON M20 20 MEQ tablet TAKE 1 TABLET BY MOUTH TWICE A DAY 60 tablet 3  . oxyCODONE-acetaminophen (PERCOCET) 10-325 MG per tablet Take 1 tablet by mouth every 6 (six) hours as needed for pain. 90 tablet 0  . simvastatin (ZOCOR) 40 MG tablet TAKE 1 TABLET (40 MG TOTAL) BY MOUTH AT BEDTIME. 30 tablet 2  . spironolactone (ALDACTONE) 25 MG tablet Take 1 tablet (25 mg total) by mouth daily. 30 tablet 6  . torsemide (DEMADEX) 20 MG tablet Take 20 mg by mouth daily. 3 tabs in the AM and 2 tabs in the PM     No current facility-administered medications for this encounter.    Filed Vitals:   04/15/15 1027  BP: 110/84  Pulse: 93  Weight: 326 lb (147.873 kg)  SpO2: 95%   General: NAD, obese.  Neck:  Thick, JVP hard to see not overly elevated. No thyromegaly or thyroid nodule.  Lungs: Clear to auscultation bilaterally with normal respiratory effort. CV: Nondisplaced PMI.  Heart irregular S1/S2, no murmur.  No carotid bruit.   Abdomen: Soft, nontender, no hepatosplenomegaly, mildly distention.  Neurologic: Alert and oriented x 3.  Psych: Normal affect. Extremities: No clubbing or cyanosis. Trace -1+  lower extremity edema   Assessment/Plan:  1. Chronic Systolic heart failure: Nonischemic cardiomyopathy, likely related to HTN initially.  Echo in 8/14 with EF 45% but EF down to 25-30% on TEE while in atrial flutter (03/2014).  ECHO (1/16) with EF ~25%. No cardiac MRI given creatinine 1.96 and size . Concern for amyloid . No M spike. Fat pad biopsy negative.  Suspect low voltage may just be due to size. Would not proceed with endomyocardial biopsy at this time. Will d/w Dr. Shirlee Latch - On schedule with Dr. Graciela Husbands for ICD on 10/28  (would prefer Optivol device) - Stable NYHA II. Volume status mildly elevated. Can increaed torsemide 60 bid as needed otherwise continue 60/40.  - Continue Coreg 25 mg bid and  spironolactone 25 mg daily.  - Continue Bidil 2 tabs tid.   - Has not been on ACEI due to elevated creatinine. Will check BMET today. If OK can consider losartan or low-dose Entresto.  - Reinforced the need and importance of daily weights, a low sodium diet, and fluid restriction (less than 2 L a day). Instructed to call the HF clinic if weight increases more than 3 lbs overnight or 5 lbs in a week. .  2. Low voltage on ECG - SPEP and fat pad biopsy negative. See above 3. CKD: check BMET.  4. HYPERTENSION: Stable. BP controlled.   5. OSA: Not very compliant with CPAP. Discussed the need to wear as much as possible.  6. Morbid Obesity: Discussed again the need to lose weight and to watch his portion sizes.  7. Atrial flutter: Atypical atrial flutter, paroxysmal in early September.  He  is now back in NSR after DCCV.  CHADSVASC = 2 (HTN, CHF). Continue eliquis  At 5 mg twice a day. 8. Gout: Per PCP. Followed by Dr Lajoyce Corners.     Bensimhon, Daniel,MD 10:31 AM

## 2015-04-15 NOTE — Addendum Note (Signed)
Encounter addended by: Noralee Space, RN on: 04/15/2015 11:00 AM<BR>     Documentation filed: Visit Diagnoses, Dx Association, Orders

## 2015-04-15 NOTE — Patient Instructions (Signed)
Increase Torsemide to 60 mg (3 tabs) Twice daily for 3 DAYS ONLY,  THEN back to 60 mg in AM and 40 mg in PM  Labs today  We will contact you in 3 months to schedule your next appointment.

## 2015-04-22 ENCOUNTER — Emergency Department (HOSPITAL_COMMUNITY)
Admission: EM | Admit: 2015-04-22 | Discharge: 2015-04-22 | Disposition: A | Discharge status: Home / Self Care | Payer: Medicaid Other | Attending: Emergency Medicine | Admitting: Emergency Medicine

## 2015-04-22 ENCOUNTER — Encounter (HOSPITAL_COMMUNITY): Payer: Self-pay | Admitting: Emergency Medicine

## 2015-04-22 DIAGNOSIS — M545 Low back pain, unspecified: Secondary | ICD-10-CM

## 2015-04-22 DIAGNOSIS — Z87438 Personal history of other diseases of male genital organs: Secondary | ICD-10-CM | POA: Diagnosis not present

## 2015-04-22 DIAGNOSIS — Z862 Personal history of diseases of the blood and blood-forming organs and certain disorders involving the immune mechanism: Secondary | ICD-10-CM | POA: Insufficient documentation

## 2015-04-22 DIAGNOSIS — Z79899 Other long term (current) drug therapy: Secondary | ICD-10-CM | POA: Insufficient documentation

## 2015-04-22 DIAGNOSIS — Z8719 Personal history of other diseases of the digestive system: Secondary | ICD-10-CM | POA: Insufficient documentation

## 2015-04-22 DIAGNOSIS — Z872 Personal history of diseases of the skin and subcutaneous tissue: Secondary | ICD-10-CM | POA: Insufficient documentation

## 2015-04-22 DIAGNOSIS — N183 Chronic kidney disease, stage 3 (moderate): Secondary | ICD-10-CM | POA: Diagnosis not present

## 2015-04-22 DIAGNOSIS — Z8669 Personal history of other diseases of the nervous system and sense organs: Secondary | ICD-10-CM | POA: Diagnosis not present

## 2015-04-22 DIAGNOSIS — I129 Hypertensive chronic kidney disease with stage 1 through stage 4 chronic kidney disease, or unspecified chronic kidney disease: Secondary | ICD-10-CM | POA: Insufficient documentation

## 2015-04-22 DIAGNOSIS — Z9889 Other specified postprocedural states: Secondary | ICD-10-CM | POA: Diagnosis not present

## 2015-04-22 DIAGNOSIS — M109 Gout, unspecified: Secondary | ICD-10-CM | POA: Diagnosis not present

## 2015-04-22 DIAGNOSIS — E785 Hyperlipidemia, unspecified: Secondary | ICD-10-CM | POA: Insufficient documentation

## 2015-04-22 DIAGNOSIS — I5022 Chronic systolic (congestive) heart failure: Secondary | ICD-10-CM | POA: Diagnosis not present

## 2015-04-22 DIAGNOSIS — Z7901 Long term (current) use of anticoagulants: Secondary | ICD-10-CM | POA: Diagnosis not present

## 2015-04-22 MED ORDER — CYCLOBENZAPRINE HCL 10 MG PO TABS: 10.0000 mg | ORAL_TABLET | Freq: Two times a day (BID) | ORAL | Status: DC | PRN

## 2015-04-22 NOTE — ED Notes (Signed)
Pt c/o lower back pain x 3 days; pt denies obvious injury

## 2015-04-22 NOTE — ED Provider Notes (Signed)
CSN: [id number]     Arrival date & time 04/22/15  1301 History  By signing my name below, I, Eric Murillo, attest that this documentation has been prepared under the direction and in the presence of [name], PA-C. Electronically Signed: Angelene Giovanni, ED Scribe. 04/22/2015. 2:28 PM.    Chief Complaint  Patient presents with  . Back Pain   The history is provided by the patient. No language interpreter was used.   HPI Comments: Eric Murillo is a 54 y.o. male with a hx of Chronic Kidney Disease who presents to the Emergency Department complaining of gradually worsening sudden onset of lower back pain onset 3 days ago. He explains that when he stands up, the area is tight which gives him difficulty ambulating. He denies any loss of bladder or bowel control, fever, numbness or tingling in his arms and legs, or any other red flag signs. He denies taking any medication PTA.  He denies a hx of back pain or any recent injury to the back. Patient reports pain is worse with forward flexion. Patient denies any radiation of symptoms down into his lower extremities.  Past Medical History  Diagnosis Date  . Hypertension   . Hyperlipidemia   . Chronic systolic congestive heart failure, NYHA class 2     EF 20-25% 1/16  . Organic erectile dysfunction   . Morbid obesity with BMI of 45.0-49.9, adult   . Gout     Of big toe  . CKD (chronic kidney disease) stage 3, GFR 30-59 ml/min   . Atrial flutter     DCCV 9/15  . Non-ischemic cardiomyopathy   . OSA (obstructive sleep apnea)   . Submandibular sialolithiasis     Right  . Pilonidal cyst   . Dyslipidemia   . Anemia of chronic disease   . Chest pain on exertion   . Hyperglycemia   . Arthritis    Past Surgical History  Procedure Laterality Date  . Colonoscopy    . Salivary gland surgery  09/12/2012  . Cardiac catheterization  2012  . Submandibular gland excision Right 09/12/2012    Procedure: Removal Right Submandibular Larina Bras;   Surgeon: Serena Colonel, MD;  Location: [facility] OR;  Service: ENT;  Laterality: Right;  . Tee without cardioversion N/A 04/03/2014    Procedure: TRANSESOPHAGEAL ECHOCARDIOGRAM (TEE);  Surgeon: Laurey Morale, MD;  Location: [facility] ENDOSCOPY;  Service: Cardiovascular;  Laterality: N/A;  . Cardioversion N/A 04/03/2014    Procedure: CARDIOVERSION;  Surgeon: Laurey Morale, MD;  Location: [facility] ENDOSCOPY;  Service: Cardiovascular;  Laterality: N/A;  . Left and right heart catheterization with coronary angiogram N/A 04/24/2014    Procedure: LEFT AND RIGHT HEART CATHETERIZATION WITH CORONARY ANGIOGRAM;  Surgeon: Laurey Morale, MD;  Location: [facility] CATH LAB;  Service: Cardiovascular;  Laterality: N/A;   Family History  Problem Relation Age of Onset  . Hypertension Mother   . Hypertension Father   . Cancer Father     brother died of brain cancer; sister died of bone cancer  . Diabetes Sister   . Diabetes Brother   . Colon cancer Maternal Aunt   . Cardiomyopathy Neg Hx    Social History  Substance Use Topics  . Smoking status: Never Smoker   . Smokeless tobacco: Never Used  . Alcohol Use: No    Review of Systems  All other systems reviewed and are negative.     Allergies  Beta adrenergic blockers  Home Medications   Prior to Admission  medications   Medication Sig Start Date End Date Taking? Authorizing Provider  allopurinol (ZYLOPRIM) 100 MG tablet Take 1 tablet (100 mg total) by mouth 2 (two) times daily. 07/28/14 07/28/15  Adrian Blackwater Moding, MD  carvedilol (COREG) 25 MG tablet TAKE 1 TABLET BY MOUTH TWICE A DAY WITH A MEAL 11/06/14   Dolores Patty, MD  colchicine 0.6 MG tablet Take 1 tablet (0.6 mg total) by mouth 2 (two) times daily. 07/27/14   Dolores Patty, MD  cyclobenzaprine (FLEXERIL) 10 MG tablet Take 1 tablet (10 mg total) by mouth 2 (two) times daily as needed for muscle spasms. 04/22/15   Khaliah Barnick, PA-C  ELIQUIS 5 MG TABS tablet TAKE 1 TABLET BY MOUTH TWICE A DAY 11/27/14   Dolores Patty, MD  isosorbide-hydrALAZINE (BIDIL) 20-37.5 MG per tablet Take 2 tablets by mouth 3 (three) times daily. 02/11/15   Laurey Morale, MD  KLOR-CON M20 20 MEQ tablet TAKE 1 TABLET BY MOUTH TWICE A DAY 10/26/14   Dolores Patty, MD  oxyCODONE-acetaminophen (PERCOCET) 10-325 MG per tablet Take 1 tablet by mouth every 6 (six) hours as needed for pain. 03/31/15   Darreld Mclean, MD  simvastatin (ZOCOR) 40 MG tablet TAKE 1 TABLET (40 MG TOTAL) BY MOUTH AT BEDTIME. 12/23/14   Dolores Patty, MD  spironolactone (ALDACTONE) 25 MG tablet Take 1 tablet (25 mg total) by mouth daily. 09/18/14   Dolores Patty, MD  torsemide (DEMADEX) 20 MG tablet 3 tabs in the AM and 2 tabs in the PM 04/15/15   Bevelyn Buckles Bensimhon, MD   BP 160/89 mmHg  Pulse 99  Temp(Src) 98.5 F (36.9 C) (Oral)  Resp 16  SpO2 96%   Physical Exam  Constitutional: He is oriented to person, place, and time. He appears well-developed and well-nourished. No distress.  HENT:  Head: Normocephalic.  Neck: Normal range of motion. Neck supple.  Pulmonary/Chest: Effort normal.  Musculoskeletal: Normal range of motion. He exhibits tenderness. He exhibits no edema.  No C, T, or L spine tenderness to palpation. No obvious signs of trauma, deformity, infection, step-offs. Lung expansion normal. No scoliosis or kyphosis. Bilateral lower extremity strength 5 out of 5, sensation grossly intact, patellar reflexes 2+, pedal pulses 2+, Refill less than 3 seconds. Minor tenderness to palpation of the bilateral lower lumbar soft tissues  Straight leg negative Ambulates without difficulty   Neurological: He is alert and oriented to person, place, and time.  Skin: Skin is warm and dry. He is not diaphoretic.  Psychiatric: He has a normal mood and affect. His behavior is normal. Judgment and thought content normal.  Nursing note and vitals reviewed.   ED Course  Procedures (including critical care time) DIAGNOSTIC STUDIES: Oxygen Saturation  is 96% on RA, normal by my interpretation.    COORDINATION OF CARE: 1:44 PM- Pt advised of plan for treatment and pt agrees.    Labs Review Labs Reviewed - No data to display  Imaging Review No results found.  Eyvonne Mechanic, PA-C has personally reviewed and evaluated these images and lab results as part of his medical decision-making.   EKG Interpretation None      MDM   Final diagnoses:  Bilateral low back pain without sciatica   Labs:  Imaging:  Consults:  Therapeutics:  Discharge Meds:   Assessment/Plan: Patient presentation likely represents muscular strain. He has no red flags, ambulatory without difficulty. No changes in urinary characteristics or frequency.  Patient instructed to  take Tylenol and to use ice. Patient encouraged to follow-up with his primary care provider in one week if symptoms continue to persist.  I personally performed the services described in this documentation, which was scribed in my presence. The recorded information has been reviewed and is accurate.    Eyvonne Mechanic, PA-C 04/22/15 1428  Gilda Crease, MD 04/23/15 [phone]

## 2015-04-22 NOTE — Discharge Instructions (Signed)
Please follow-up with your primary care provider in one week for reevaluation of symptoms persist. Please return immediately if they worsen. Please use medication only as directed, please contact her primary care provider inform them of your visit including new medication.

## 2015-04-29 ENCOUNTER — Telehealth: Payer: Self-pay | Admitting: Internal Medicine

## 2015-04-29 ENCOUNTER — Other Ambulatory Visit: Payer: Self-pay | Admitting: Internal Medicine

## 2015-04-29 DIAGNOSIS — M25572 Pain in left ankle and joints of left foot: Secondary | ICD-10-CM

## 2015-04-29 DIAGNOSIS — Z01812 Encounter for preprocedural laboratory examination: Secondary | ICD-10-CM

## 2015-04-29 DIAGNOSIS — I5022 Chronic systolic (congestive) heart failure: Secondary | ICD-10-CM

## 2015-04-29 NOTE — Telephone Encounter (Signed)
Spoke with Eric Murillo and informed him that Sherri had left for the day. Eric Murillo states that he knows that Roanna Raider was calling to speak with him about his procedure later this month. Eric Murillo states that he is fine with waiting until tomorrow so he can speak with Sherri.

## 2015-04-29 NOTE — Telephone Encounter (Signed)
Pt called requesting oxycodone to be filled. °

## 2015-04-29 NOTE — Telephone Encounter (Signed)
Last refill 04/01/15 Last office visit 03/10/15 Last UDS 03/2014

## 2015-04-29 NOTE — Telephone Encounter (Signed)
lmtcb to review procedure/instructions. 

## 2015-04-29 NOTE — Telephone Encounter (Signed)
F/u      Pt returning Sherri's call.

## 2015-04-30 ENCOUNTER — Other Ambulatory Visit: Payer: Medicaid Other

## 2015-04-30 ENCOUNTER — Encounter: Payer: Self-pay | Admitting: *Deleted

## 2015-04-30 NOTE — Telephone Encounter (Signed)
Scheduled ICD 10/28. Pre procedure labs on 10/21. Wound check on 11/7. Letter of instructions reviewed with patient and mailed to home address per pt request. Patient verbalized understanding and agreeable to plan.

## 2015-05-03 MED ORDER — OXYCODONE-ACETAMINOPHEN 10-325 MG PO TABS: 1.0000 | ORAL_TABLET | Freq: Four times a day (QID) | ORAL | Status: DC | PRN

## 2015-05-03 NOTE — Telephone Encounter (Signed)
Pt informed Rx is ready 

## 2015-05-06 ENCOUNTER — Ambulatory Visit: Payer: Medicaid Other | Admitting: Internal Medicine

## 2015-05-12 ENCOUNTER — Ambulatory Visit (INDEPENDENT_AMBULATORY_CARE_PROVIDER_SITE_OTHER): Payer: Medicaid Other | Admitting: Internal Medicine

## 2015-05-12 ENCOUNTER — Encounter: Payer: Self-pay | Admitting: Internal Medicine

## 2015-05-12 ENCOUNTER — Other Ambulatory Visit (HOSPITAL_COMMUNITY): Payer: Self-pay | Admitting: Internal Medicine

## 2015-05-12 VITALS — BP 129/94 | HR 104 | Temp 98.7°F | Wt 331.5 lb

## 2015-05-12 DIAGNOSIS — Z23 Encounter for immunization: Secondary | ICD-10-CM | POA: Diagnosis not present

## 2015-05-12 DIAGNOSIS — M545 Low back pain, unspecified: Secondary | ICD-10-CM

## 2015-05-12 DIAGNOSIS — R600 Localized edema: Secondary | ICD-10-CM

## 2015-05-12 DIAGNOSIS — I428 Other cardiomyopathies: Secondary | ICD-10-CM

## 2015-05-12 DIAGNOSIS — I429 Cardiomyopathy, unspecified: Secondary | ICD-10-CM

## 2015-05-12 NOTE — Patient Instructions (Addendum)
Mr. Pfohl it was nice meeting you today.  -Please go to the emergency room immediately if you experience chest pain, shortness of breath, fever, or worsening of your leg swelling.   -Have a wonderful day!

## 2015-05-14 ENCOUNTER — Other Ambulatory Visit (INDEPENDENT_AMBULATORY_CARE_PROVIDER_SITE_OTHER): Payer: Medicaid Other | Admitting: *Deleted

## 2015-05-14 DIAGNOSIS — E785 Hyperlipidemia, unspecified: Secondary | ICD-10-CM | POA: Diagnosis not present

## 2015-05-14 DIAGNOSIS — Z01812 Encounter for preprocedural laboratory examination: Secondary | ICD-10-CM

## 2015-05-14 DIAGNOSIS — I5022 Chronic systolic (congestive) heart failure: Secondary | ICD-10-CM

## 2015-05-14 LAB — CBC WITH DIFFERENTIAL/PLATELET
BASOS ABS: 0 10*3/uL (ref 0.0–0.1)
Basophils Relative: 0 % (ref 0–1)
EOS ABS: 0.3 10*3/uL (ref 0.0–0.7)
EOS PCT: 3 % (ref 0–5)
HEMATOCRIT: 38.5 % — AB (ref 39.0–52.0)
Hemoglobin: 12.4 g/dL — ABNORMAL LOW (ref 13.0–17.0)
LYMPHS ABS: 2.9 10*3/uL (ref 0.7–4.0)
LYMPHS PCT: 28 % (ref 12–46)
MCH: 27.4 pg (ref 26.0–34.0)
MCHC: 32.2 g/dL (ref 30.0–36.0)
MCV: 85 fL (ref 78.0–100.0)
MONOS PCT: 13 % — AB (ref 3–12)
MPV: 10.6 fL (ref 8.6–12.4)
Monocytes Absolute: 1.3 10*3/uL — ABNORMAL HIGH (ref 0.1–1.0)
Neutro Abs: 5.7 10*3/uL (ref 1.7–7.7)
Neutrophils Relative %: 56 % (ref 43–77)
PLATELETS: 239 10*3/uL (ref 150–400)
RBC: 4.53 MIL/uL (ref 4.22–5.81)
RDW: 14.1 % (ref 11.5–15.5)
WBC: 10.2 10*3/uL (ref 4.0–10.5)

## 2015-05-14 LAB — BASIC METABOLIC PANEL
BUN: 15 mg/dL (ref 7–25)
CHLORIDE: 103 mmol/L (ref 98–110)
CO2: 30 mmol/L (ref 20–31)
CREATININE: 1.44 mg/dL — AB (ref 0.70–1.33)
Calcium: 8.7 mg/dL (ref 8.6–10.3)
Glucose, Bld: 120 mg/dL — ABNORMAL HIGH (ref 65–99)
POTASSIUM: 4.2 mmol/L (ref 3.5–5.3)
Sodium: 142 mmol/L (ref 135–146)

## 2015-05-14 NOTE — Addendum Note (Signed)
Addended by: Tonita Phoenix on: 05/14/2015 09:59 AM   Modules accepted: Orders

## 2015-05-15 DIAGNOSIS — M545 Low back pain, unspecified: Secondary | ICD-10-CM | POA: Insufficient documentation

## 2015-05-15 DIAGNOSIS — R600 Localized edema: Secondary | ICD-10-CM | POA: Insufficient documentation

## 2015-05-15 NOTE — Assessment & Plan Note (Addendum)
Patient states he has an appointment with Cardiology on 05/21/15 for ICD placement. Denies any CP or SOB.

## 2015-05-15 NOTE — Assessment & Plan Note (Signed)
Patient is complaining of a 3 week history of bilateral lower back pain. States he went to the ED on 04/22/15 and was given Flexeril and Percocet which helped with the pain. Denies any prior history of back problems or trauma to the area. Denies any radiation of the pain to the legs. Denies any alarm symptoms such as fecal or urinary incontinence. Physical exam showing normal ROM and no tenderness on palpation.  -Reassess at future visits. He did not request any medications today.

## 2015-05-15 NOTE — Assessment & Plan Note (Addendum)
Patient has lower extremity pitting edema R>L and attributes that to increased fluid intake. He has CKD stage 3. Assymterical lower extremity edema not likely DVT because patient denies any recent trips, no calf pain, Homan's sign negative. He is tachycardic at this visit, pulse 104. However, denies CP or SOB. Wells Score 1.5, making PE unlikely.  -Restrict fluid intake

## 2015-05-15 NOTE — Progress Notes (Signed)
Patient ID: Eric Murillo, male   DOB: 1961-06-12, 54 y.o.   MRN: [id number]   Subjective:   Patient ID: Eric Murillo male   DOB: 21-May-1961 54 y.o.   MRN: [id number]  HPI: [name] is a 54 y.o. M with a PMHx of conditions listed below presenting to the clinic after a recent ED visit for back pain. Patient is complaining of a 3 week history of bilateral lower back pain. States he went to the ED on 04/22/15 and was given Flexeril and Percocet which helped with the pain. Denies any prior history of back problems or trauma to the area. Denies any radiation of the pain to the legs. Denies any alarm symptoms such as fecal or urinary incontinence.    Past Medical History  Diagnosis Date  . Hypertension   . Hyperlipidemia   . Chronic systolic congestive heart failure, NYHA class 2 (HCC)     EF 20-25% 1/16  . Organic erectile dysfunction   . Morbid obesity with BMI of 45.0-49.9, adult (HCC)   . Gout     Of big toe  . CKD (chronic kidney disease) stage 3, GFR 30-59 ml/min   . Atrial flutter (HCC)     DCCV 9/15  . Non-ischemic cardiomyopathy (HCC)   . OSA (obstructive sleep apnea)   . Submandibular sialolithiasis     Right  . Pilonidal cyst   . Dyslipidemia   . Anemia of chronic disease   . Chest pain on exertion   . Hyperglycemia   . Arthritis    Current Outpatient Prescriptions  Medication Sig Dispense Refill  . allopurinol (ZYLOPRIM) 100 MG tablet Take 1 tablet (100 mg total) by mouth 2 (two) times daily. 60 tablet 2  . carvedilol (COREG) 25 MG tablet TAKE 1 TABLET BY MOUTH TWICE A DAY WITH A MEAL 60 tablet 6  . colchicine 0.6 MG tablet Take 1 tablet (0.6 mg total) by mouth 2 (two) times daily. 60 tablet 0  . cyclobenzaprine (FLEXERIL) 10 MG tablet Take 1 tablet (10 mg total) by mouth 2 (two) times daily as needed for muscle spasms. 20 tablet 0  . ELIQUIS 5 MG TABS tablet TAKE 1 TABLET BY MOUTH TWICE A DAY 60 tablet 6  . isosorbide-hydrALAZINE (BIDIL) 20-37.5 MG per tablet  Take 2 tablets by mouth 3 (three) times daily. 180 tablet 3  . KLOR-CON M20 20 MEQ tablet TAKE 1 TABLET BY MOUTH TWICE A DAY 60 tablet 3  . oxyCODONE-acetaminophen (PERCOCET) 10-325 MG tablet Take 1 tablet by mouth every 6 (six) hours as needed for pain. 90 tablet 0  . simvastatin (ZOCOR) 40 MG tablet TAKE 1 TABLET (40 MG TOTAL) BY MOUTH AT BEDTIME. 30 tablet 2  . spironolactone (ALDACTONE) 25 MG tablet Take 1 tablet (25 mg total) by mouth daily. 30 tablet 6  . torsemide (DEMADEX) 20 MG tablet 3 tabs in the AM and 2 tabs in the PM     No current facility-administered medications for this visit.   Family History  Problem Relation Age of Onset  . Hypertension Mother   . Hypertension Father   . Cancer Father     brother died of brain cancer; sister died of bone cancer  . Diabetes Sister   . Diabetes Brother   . Colon cancer Maternal Aunt   . Cardiomyopathy Neg Hx    Social History   Social History  . Marital Status: Single    Spouse Name: N/A  . Number of Children: N/A  .  Years of Education: N/A   Social History Main Topics  . Smoking status: Never Smoker   . Smokeless tobacco: Never Used  . Alcohol Use: No  . Drug Use: No  . Sexual Activity: Not Asked   Other Topics Concern  . None   Social History Narrative   Financial assistance approved for 100% discount at [facility] and has [facility] card   [employer]  March 16, 2010 9:42 AM      Lives with mother.  Has a girlfriend   Review of Systems: Review of Systems  Constitutional: Negative for fever and chills.  HENT: Negative for ear pain.   Eyes: Negative for blurred vision and pain.  Respiratory: Negative for cough, shortness of breath and wheezing.   Cardiovascular: Negative for chest pain and leg swelling.  Gastrointestinal: Negative for nausea, vomiting, abdominal pain, diarrhea and constipation.  Genitourinary: Negative for dysuria, urgency and frequency.  Musculoskeletal: Positive for back pain. Negative for myalgias.    Skin: Negative for itching and rash.  Neurological: Negative for sensory change, focal weakness and headaches.    Objective:  Physical Exam: Filed Vitals:   05/12/15 0930 05/12/15 1004  BP: 129/94   Pulse: 113 104  Temp: 98.7 F (37.1 C)   TempSrc: Oral   Weight: 331 lb 8 oz (150.367 kg)   SpO2: 96%    Physical Exam  Constitutional: He is oriented to person, place, and time. He appears well-developed and well-nourished. No distress.  HENT:  Head: Normocephalic and atraumatic.  Eyes: EOM are normal. Pupils are equal, round, and reactive to light.  Neck: Normal range of motion. Neck supple. No tracheal deviation present.  Cardiovascular: Normal rate, regular rhythm and intact distal pulses.   Pulmonary/Chest: Effort normal. No respiratory distress. He has no wheezes. He has no rales.  Abdominal: Soft. Bowel sounds are normal. He exhibits no distension. There is no tenderness.  Musculoskeletal: Normal range of motion. He exhibits edema. He exhibits no tenderness.  Lumbar spine: normal ROM. No tenderness on palpation of spine or paraspinal muscles.  Bilateral lower extremity edema: +2 right leg and +1 left leg     Neurological: He is alert and oriented to person, place, and time.  Skin: Skin is warm and dry.   Assessment & Plan:

## 2015-05-20 NOTE — Progress Notes (Signed)
Internal Medicine Clinic Attending  I saw and evaluated the patient.  I personally confirmed the key portions of the history and exam documented by Dr. Rathore and I reviewed pertinent patient test results.  The assessment, diagnosis, and plan were formulated together and I agree with the documentation in the resident's note.  

## 2015-05-21 ENCOUNTER — Encounter (HOSPITAL_COMMUNITY)
Admission: RE | Disposition: A | Discharge status: Home / Self Care | Payer: Self-pay | Source: Ambulatory Visit | Attending: Internal Medicine

## 2015-05-21 ENCOUNTER — Ambulatory Visit (HOSPITAL_COMMUNITY): Payer: Medicaid Other

## 2015-05-21 ENCOUNTER — Ambulatory Visit (HOSPITAL_COMMUNITY)
Admission: RE | Admit: 2015-05-21 | Discharge: 2015-05-23 | Disposition: A | Discharge status: Home / Self Care | Payer: Medicaid Other | Source: Ambulatory Visit | Attending: Internal Medicine | Admitting: Internal Medicine

## 2015-05-21 ENCOUNTER — Encounter (HOSPITAL_COMMUNITY): Payer: Self-pay | Admitting: Internal Medicine

## 2015-05-21 DIAGNOSIS — I129 Hypertensive chronic kidney disease with stage 1 through stage 4 chronic kidney disease, or unspecified chronic kidney disease: Secondary | ICD-10-CM | POA: Diagnosis not present

## 2015-05-21 DIAGNOSIS — N183 Chronic kidney disease, stage 3 (moderate): Secondary | ICD-10-CM | POA: Insufficient documentation

## 2015-05-21 DIAGNOSIS — Z6841 Body Mass Index (BMI) 40.0 and over, adult: Secondary | ICD-10-CM | POA: Insufficient documentation

## 2015-05-21 DIAGNOSIS — E785 Hyperlipidemia, unspecified: Secondary | ICD-10-CM | POA: Diagnosis not present

## 2015-05-21 DIAGNOSIS — I5022 Chronic systolic (congestive) heart failure: Secondary | ICD-10-CM | POA: Diagnosis not present

## 2015-05-21 DIAGNOSIS — G4733 Obstructive sleep apnea (adult) (pediatric): Secondary | ICD-10-CM | POA: Insufficient documentation

## 2015-05-21 DIAGNOSIS — Z959 Presence of cardiac and vascular implant and graft, unspecified: Secondary | ICD-10-CM

## 2015-05-21 DIAGNOSIS — I429 Cardiomyopathy, unspecified: Secondary | ICD-10-CM | POA: Diagnosis present

## 2015-05-21 DIAGNOSIS — Z7901 Long term (current) use of anticoagulants: Secondary | ICD-10-CM | POA: Diagnosis not present

## 2015-05-21 DIAGNOSIS — I509 Heart failure, unspecified: Secondary | ICD-10-CM

## 2015-05-21 HISTORY — PX: EP IMPLANTABLE DEVICE: SHX172B

## 2015-05-21 LAB — SURGICAL PCR SCREEN
MRSA, PCR: NEGATIVE
Staphylococcus aureus: POSITIVE — AB

## 2015-05-21 SURGERY — ICD IMPLANT
Anesthesia: LOCAL

## 2015-05-21 MED ORDER — COLCHICINE 0.6 MG PO TABS
0.6000 mg | ORAL_TABLET | Freq: Two times a day (BID) | ORAL | Status: DC
  Administered 2015-05-21 – 2015-05-23 (×5): 0.6 mg via ORAL
  Filled 2015-05-21 (×5): qty 1

## 2015-05-21 MED ORDER — LIDOCAINE HCL (PF) 1 % IJ SOLN
INTRAMUSCULAR | Status: DC | PRN
  Administered 2015-05-21: 30 mL

## 2015-05-21 MED ORDER — SODIUM CHLORIDE 0.9 % IV SOLN
INTRAVENOUS | Status: DC
  Administered 2015-05-21: 07:00:00 via INTRAVENOUS

## 2015-05-21 MED ORDER — CEFAZOLIN SODIUM-DEXTROSE 2-3 GM-% IV SOLR
INTRAVENOUS | Status: DC | PRN
  Administered 2015-05-21: 3 g via INTRAVENOUS

## 2015-05-21 MED ORDER — ONDANSETRON HCL 4 MG/2ML IJ SOLN: 4.0000 mg | Freq: Four times a day (QID) | INTRAMUSCULAR | Status: DC | PRN

## 2015-05-21 MED ORDER — SPIRONOLACTONE 25 MG PO TABS
25.0000 mg | ORAL_TABLET | Freq: Every day | ORAL | Status: DC
  Administered 2015-05-21 – 2015-05-23 (×3): 25 mg via ORAL
  Filled 2015-05-21 (×3): qty 1

## 2015-05-21 MED ORDER — CYCLOBENZAPRINE HCL 10 MG PO TABS: 10.0000 mg | ORAL_TABLET | Freq: Two times a day (BID) | ORAL | Status: DC | PRN

## 2015-05-21 MED ORDER — MUPIROCIN 2 % EX OINT
1.0000 "application " | TOPICAL_OINTMENT | Freq: Once | CUTANEOUS | Status: AC
  Administered 2015-05-21: 1 via TOPICAL
  Filled 2015-05-21: qty 22

## 2015-05-21 MED ORDER — ACETAMINOPHEN 325 MG PO TABS: 325.0000 mg | ORAL_TABLET | ORAL | Status: DC | PRN

## 2015-05-21 MED ORDER — OXYCODONE HCL 5 MG PO TABS
5.0000 mg | ORAL_TABLET | Freq: Four times a day (QID) | ORAL | Status: DC | PRN
  Administered 2015-05-21: 5 mg via ORAL
  Filled 2015-05-21: qty 1

## 2015-05-21 MED ORDER — MIDAZOLAM HCL 5 MG/5ML IJ SOLN
INTRAMUSCULAR | Status: AC
  Filled 2015-05-21: qty 25

## 2015-05-21 MED ORDER — MIDAZOLAM HCL 5 MG/5ML IJ SOLN
INTRAMUSCULAR | Status: DC | PRN
  Administered 2015-05-21 (×4): 1 mg via INTRAVENOUS

## 2015-05-21 MED ORDER — SODIUM CHLORIDE 0.9 % IV SOLN: INTRAVENOUS | Status: DC

## 2015-05-21 MED ORDER — FUROSEMIDE 10 MG/ML IJ SOLN
80.0000 mg | Freq: Two times a day (BID) | INTRAMUSCULAR | Status: DC
  Administered 2015-05-21 – 2015-05-23 (×4): 80 mg via INTRAVENOUS
  Filled 2015-05-21 (×5): qty 8

## 2015-05-21 MED ORDER — CEFAZOLIN SODIUM 1-5 GM-% IV SOLN
1.0000 g | Freq: Four times a day (QID) | INTRAVENOUS | Status: AC
  Administered 2015-05-21 – 2015-05-22 (×3): 1 g via INTRAVENOUS
  Filled 2015-05-21 (×4): qty 50

## 2015-05-21 MED ORDER — POTASSIUM CHLORIDE CRYS ER 20 MEQ PO TBCR
20.0000 meq | EXTENDED_RELEASE_TABLET | Freq: Two times a day (BID) | ORAL | Status: DC
  Administered 2015-05-21 – 2015-05-23 (×5): 20 meq via ORAL
  Filled 2015-05-21 (×5): qty 1

## 2015-05-21 MED ORDER — ISOSORB DINITRATE-HYDRALAZINE 20-37.5 MG PO TABS
2.0000 | ORAL_TABLET | Freq: Three times a day (TID) | ORAL | Status: DC
  Administered 2015-05-21 – 2015-05-23 (×7): 2 via ORAL
  Filled 2015-05-21 (×8): qty 2

## 2015-05-21 MED ORDER — ALLOPURINOL 100 MG PO TABS
100.0000 mg | ORAL_TABLET | Freq: Two times a day (BID) | ORAL | Status: DC
  Administered 2015-05-21 – 2015-05-23 (×5): 100 mg via ORAL
  Filled 2015-05-21 (×5): qty 1

## 2015-05-21 MED ORDER — DEXTROSE 5 % IV SOLN
3.0000 g | INTRAVENOUS | Status: DC
  Filled 2015-05-21: qty 3000

## 2015-05-21 MED ORDER — OXYCODONE-ACETAMINOPHEN 5-325 MG PO TABS: 1.0000 | ORAL_TABLET | Freq: Four times a day (QID) | ORAL | Status: DC | PRN

## 2015-05-21 MED ORDER — CHLORHEXIDINE GLUCONATE 4 % EX LIQD
60.0000 mL | Freq: Once | CUTANEOUS | Status: DC
  Filled 2015-05-21: qty 60

## 2015-05-21 MED ORDER — FENTANYL CITRATE (PF) 100 MCG/2ML IJ SOLN
INTRAMUSCULAR | Status: DC | PRN
  Administered 2015-05-21: 50 ug via INTRAVENOUS
  Administered 2015-05-21 (×2): 25 ug via INTRAVENOUS

## 2015-05-21 MED ORDER — MUPIROCIN 2 % EX OINT
TOPICAL_OINTMENT | CUTANEOUS | Status: AC
  Filled 2015-05-21: qty 22

## 2015-05-21 MED ORDER — FENTANYL CITRATE (PF) 100 MCG/2ML IJ SOLN
INTRAMUSCULAR | Status: AC
  Filled 2015-05-21: qty 4

## 2015-05-21 MED ORDER — SODIUM CHLORIDE 0.9 % IR SOLN
Status: AC
  Filled 2015-05-21: qty 2

## 2015-05-21 MED ORDER — CARVEDILOL 25 MG PO TABS
25.0000 mg | ORAL_TABLET | Freq: Two times a day (BID) | ORAL | Status: DC
  Administered 2015-05-21 – 2015-05-23 (×4): 25 mg via ORAL
  Filled 2015-05-21 (×4): qty 1

## 2015-05-21 MED ORDER — SODIUM CHLORIDE 0.9 % IV SOLN: INTRAVENOUS | Status: AC

## 2015-05-21 MED ORDER — OXYCODONE-ACETAMINOPHEN 10-325 MG PO TABS: 1.0000 | ORAL_TABLET | Freq: Four times a day (QID) | ORAL | Status: DC | PRN

## 2015-05-21 MED ORDER — SODIUM CHLORIDE 0.9 % IR SOLN
80.0000 mg | Status: AC
  Administered 2015-05-21: 80 mg

## 2015-05-21 MED ORDER — LIDOCAINE HCL (PF) 1 % IJ SOLN
INTRAMUSCULAR | Status: DC | PRN
  Administered 2015-05-21: 08:00:00

## 2015-05-21 SURGICAL SUPPLY — 7 items
CABLE SURGICAL S-101-97-12 (CABLE) ×1 IMPLANT
ELECT DEFIB PAD ADLT CADENCE (PAD) ×1 IMPLANT
HEMOSTAT SURGICEL 2X4 FIBR (HEMOSTASIS) ×1 IMPLANT
ICD ELLIPSE VR CD1411-36C (ICD Generator) ×1 IMPLANT
LEAD ENDOTAK RELIANCE 0181 (Lead) ×1 IMPLANT
SHEATH CLASSIC 9F (SHEATH) ×1 IMPLANT
TRAY PACEMAKER INSERTION (PACKS) ×1 IMPLANT

## 2015-05-21 NOTE — Interval H&P Note (Signed)
ICD Criteria  Current LVEF:25%. Within 12 months prior to implant: Yes   Heart failure history: Yes, Class III  Cardiomyopathy history: Yes, Non-Ischemic Cardiomyopathy.  Atrial Fibrillation/Atrial Flutter: No.  Ventricular tachycardia history: No.  Cardiac arrest history: No.  History of syndromes with risk of sudden death: No.  Previous ICD: No.  Current ICD indication: Primary  PPM indication: No.   Class I or II Bradycardia indication present: No  Beta Blocker therapy for 3 or more months: Yes, prescribed.   Ace Inhibitor/ARB therapy for 3 or more months: No, medical reason.  History and Physical Interval Note:  05/21/2015 9:28 AM  Eric Murillo  has presented today for surgery, with the diagnosis of chronic systolic chf  The various methods of treatment have been discussed with the patient and family. After consideration of risks, benefits and other options for treatment, the patient has consented to  Procedure(s): ICD Implant (N/A) as a surgical intervention .  The patient's history has been reviewed, patient examined, no change in status, stable for surgery.  I have reviewed the patient's chart and labs.  Questions were answered to the patient's satisfaction.     Sherryl Manges

## 2015-05-21 NOTE — Progress Notes (Signed)
Pt will be admitted for IV diuretics anticipating 24-48 hrs

## 2015-05-21 NOTE — H&P (Signed)
Patient Care Team: Darreld Mclean, MD as PCP - General   HPI  Eric Murillo is a 54 y.o. male Admitted for ICD for pirmiary pervention in setting of nonischemic cardiomyopathy with lowvoltage and having undergone neg eval for amyloid  He has Morbid  Obesity, HTN OSA without CPAP and hx of atrial flutter s/p cardioversion      Past Medical History  Diagnosis Date  . Hypertension   . Hyperlipidemia   . Chronic systolic congestive heart failure, NYHA class 2 (HCC)     EF 20-25% 1/16  . Organic erectile dysfunction   . Morbid obesity with BMI of 45.0-49.9, adult (HCC)   . Gout     Of big toe  . CKD (chronic kidney disease) stage 3, GFR 30-59 ml/min   . Atrial flutter (HCC)     DCCV 9/15  . Non-ischemic cardiomyopathy (HCC)   . OSA (obstructive sleep apnea)   . Submandibular sialolithiasis     Right  . Pilonidal cyst   . Dyslipidemia   . Anemia of chronic disease   . Chest pain on exertion   . Hyperglycemia   . Arthritis     Past Surgical History  Procedure Laterality Date  . Colonoscopy    . Salivary gland surgery  09/12/2012  . Cardiac catheterization  2012  . Submandibular gland excision Right 09/12/2012    Procedure: Removal Right Submandibular Larina Bras;  Surgeon: Serena Colonel, MD;  Location: [facility] OR;  Service: ENT;  Laterality: Right;  . Tee without cardioversion N/A 04/03/2014    Procedure: TRANSESOPHAGEAL ECHOCARDIOGRAM (TEE);  Surgeon: Laurey Morale, MD;  Location: [facility] ENDOSCOPY;  Service: Cardiovascular;  Laterality: N/A;  . Cardioversion N/A 04/03/2014    Procedure: CARDIOVERSION;  Surgeon: Laurey Morale, MD;  Location: [facility] ENDOSCOPY;  Service: Cardiovascular;  Laterality: N/A;  . Left and right heart catheterization with coronary angiogram N/A 04/24/2014    Procedure: LEFT AND RIGHT HEART CATHETERIZATION WITH CORONARY ANGIOGRAM;  Surgeon: Laurey Morale, MD;  Location: [facility] CATH LAB;  Service: Cardiovascular;  Laterality: N/A;    Current  Facility-Administered Medications  Medication Dose Route Frequency Provider Last Rate Last Dose  . 0.9 %  sodium chloride infusion   Intravenous Continuous Marily Lente, NP 50 mL/hr at 05/21/15 0700    . 0.9 %  sodium chloride infusion   Intravenous Continuous Amber Caryl Bis, NP      . ceFAZolin (ANCEF) 3 g in dextrose 5 % 50 mL IVPB  3 g Intravenous On Call Amber Caryl Bis, NP      . chlorhexidine (HIBICLENS) 4 % liquid 4 application  60 mL Topical Once Amber Caryl Bis, NP      . gentamicin (GARAMYCIN) 80 mg in sodium chloride irrigation 0.9 % 500 mL irrigation  80 mg Irrigation On Call Marily Lente, NP      . mupirocin ointment (BACTROBAN) 2 %             Allergies  Allergen Reactions  . Beta Adrenergic Blockers Other (See Comments)    REACTION: A white colored Beta Blocker made him feel funny.     Medication List    ASK your doctor about these medications        allopurinol 100 MG tablet  Commonly known as:  ZYLOPRIM  Take 1 tablet (100 mg total) by mouth 2 (two) times daily.     carvedilol 25 MG tablet  Commonly known as:  COREG  TAKE  1 TABLET BY MOUTH TWICE A DAY WITH A MEAL     colchicine 0.6 MG tablet  Take 1 tablet (0.6 mg total) by mouth 2 (two) times daily.     cyclobenzaprine 10 MG tablet  Commonly known as:  FLEXERIL  Take 1 tablet (10 mg total) by mouth 2 (two) times daily as needed for muscle spasms.     ELIQUIS 5 MG Tabs tablet  Generic drug:  apixaban  TAKE 1 TABLET BY MOUTH TWICE A DAY     isosorbide-hydrALAZINE 20-37.5 MG tablet  Commonly known as:  BIDIL  Take 2 tablets by mouth 3 (three) times daily.     KLOR-CON M20 20 MEQ tablet  Generic drug:  potassium chloride SA  TAKE 1 TABLET BY MOUTH TWICE A DAY     oxyCODONE-acetaminophen 10-325 MG tablet  Commonly known as:  PERCOCET  Take 1 tablet by mouth every 6 (six) hours as needed for pain.     simvastatin 40 MG tablet  Commonly known as:  ZOCOR  TAKE 1 TABLET (40 MG TOTAL) BY MOUTH AT  BEDTIME.     spironolactone 25 MG tablet  Commonly known as:  ALDACTONE  Take 1 tablet (25 mg total) by mouth daily.     torsemide 20 MG tablet  Commonly known as:  DEMADEX  3 tabs in the AM and 2 tabs in the PM          Review of Systems negative except from HPI and PMH  Physical Exam BP 134/86 mmHg  Pulse 93  Temp(Src) 98.3 F (36.8 C) (Oral)  Resp 18  Ht 5' 9.5" (1.765 m)  Wt 331 lb (150.141 kg)  BMI 48.20 kg/m2  SpO2 97% Well developed and well nourished in no acute distress HENT normal E scleral and icterus clear Neck Supple JVP flat; carotids brisk and full Clear to ausculation  *Regular rate and rhythm, no murmurs gallops or rub Soft with active bowel sounds No clubbing cyanosis 2+ Edema Alert and oriented, grossly normal motor and sensory function Skin Warm and Dry  ECG  Sinus @ 93  Low voltage  LAFB 14/11/38  Assessment and  Plan 1) NICM 2) HTN 3) Morbidly obese 4) CHF chronic systolic 5) OSA  FOR ICD TODAY FOR primary prevention  Have reviewed the potential benefits and risks of ICD implantation including but not limited to death, perforation of heart or lung, lead dislodgement, infection,  device malfunction and inappropriate shocks.  The patient aexpress understanding  and are willing to proceed.    With volume overload will plan IV torsemide overnight

## 2015-05-22 ENCOUNTER — Ambulatory Visit (HOSPITAL_COMMUNITY): Payer: Medicaid Other

## 2015-05-22 ENCOUNTER — Other Ambulatory Visit: Payer: Self-pay | Admitting: Internal Medicine

## 2015-05-22 DIAGNOSIS — I5023 Acute on chronic systolic (congestive) heart failure: Secondary | ICD-10-CM

## 2015-05-22 DIAGNOSIS — I129 Hypertensive chronic kidney disease with stage 1 through stage 4 chronic kidney disease, or unspecified chronic kidney disease: Secondary | ICD-10-CM | POA: Diagnosis not present

## 2015-05-22 DIAGNOSIS — G4733 Obstructive sleep apnea (adult) (pediatric): Secondary | ICD-10-CM | POA: Diagnosis not present

## 2015-05-22 DIAGNOSIS — I429 Cardiomyopathy, unspecified: Secondary | ICD-10-CM | POA: Diagnosis not present

## 2015-05-22 DIAGNOSIS — I5022 Chronic systolic (congestive) heart failure: Secondary | ICD-10-CM | POA: Diagnosis not present

## 2015-05-22 MED ORDER — APIXABAN 5 MG PO TABS
5.0000 mg | ORAL_TABLET | Freq: Two times a day (BID) | ORAL | Status: DC
  Administered 2015-05-22 – 2015-05-23 (×2): 5 mg via ORAL
  Filled 2015-05-22 (×2): qty 1

## 2015-05-22 NOTE — Progress Notes (Signed)
Patient ID: Tyquavious Pigue, male   DOB: 1961-07-21, 54 y.o.   MRN: [id number]    Patient Name: Eric Murillo Date of Encounter: 05/22/2015     Active Problems:   CHF (congestive heart failure), NYHA class III (HCC)    SUBJECTIVE  No chest pain. Dyspnea is stable.   CURRENT MEDS . allopurinol  100 mg Oral BID  . carvedilol  25 mg Oral BID WC  . colchicine  0.6 mg Oral BID  . furosemide  80 mg Intravenous BID  . isosorbide-hydrALAZINE  2 tablet Oral TID  . potassium chloride SA  20 mEq Oral BID  . spironolactone  25 mg Oral Daily    OBJECTIVE  Filed Vitals:   05/21/15 1022 05/21/15 1331 05/21/15 2112 05/22/15 0439  BP: 131/88 112/70 98/77 123/67  Pulse: 104 88 92 84  Temp: 98.4 F (36.9 C) 98.3 F (36.8 C) 99 F (37.2 C) 98.5 F (36.9 C)  TempSrc: Oral Oral Oral Oral  Resp:  19 20 18   Height:      Weight:    331 lb 3.2 oz (150.231 kg)  SpO2: 96% 98% 97% 98%    Intake/Output Summary (Last 24 hours) at 05/22/15 0954 Last data filed at 05/22/15 0445  Gross per 24 hour  Intake    360 ml  Output   1875 ml  Net  -1515 ml   Filed Weights   05/21/15 0545 05/22/15 0439  Weight: 331 lb (150.141 kg) 331 lb 3.2 oz (150.231 kg)    PHYSICAL EXAM  General: Pleasant, NAD. Neuro: Alert and oriented X 3. Moves all extremities spontaneously. Psych: Normal affect. HEENT:  Normal  Neck: Supple without bruits or JVD. Lungs:  Resp regular and unlabored, CTA. Heart: RRR no s3, s4, or murmurs. Abdomen: Soft, non-tender, non-distended, BS + x 4.  Extremities: No clubbing, cyanosis or edema. DP/PT/Radials 2+ and equal bilaterally.  Accessory Clinical Findings  CBC No results for input(s): WBC, NEUTROABS, HGB, HCT, MCV, PLT in the last 72 hours. Basic Metabolic Panel No results for input(s): NA, K, CL, CO2, GLUCOSE, BUN, CREATININE, CALCIUM, MG, PHOS in the last 72 hours. Liver Function Tests No results for input(s): AST, ALT, ALKPHOS, BILITOT, PROT, ALBUMIN in the last  72 hours. No results for input(s): LIPASE, AMYLASE in the last 72 hours. Cardiac Enzymes No results for input(s): CKTOTAL, CKMB, CKMBINDEX, TROPONINI in the last 72 hours. BNP Invalid input(s): POCBNP D-Dimer No results for input(s): DDIMER in the last 72 hours. Hemoglobin A1C No results for input(s): HGBA1C in the last 72 hours. Fasting Lipid Panel No results for input(s): CHOL, HDL, LDLCALC, TRIG, CHOLHDL, LDLDIRECT in the last 72 hours. Thyroid Function Tests No results for input(s): TSH, T4TOTAL, T3FREE, THYROIDAB in the last 72 hours.  Invalid input(s): FREET3  TELE  NSR  Radiology/Studies  Dg Chest Port 1 View  05/21/2015  CLINICAL DATA:  54 year old male with ICD placement. EXAM: PORTABLE CHEST 1 VIEW COMPARISON:  04/20/2014 and prior radiographs FINDINGS: Cardiomegaly again identified. A left ICD is noted with with tip overlying interseptal region on this frontal view. There is no evidence of pneumothorax, edema or pleural effusion. Mild pulmonary vascular congestion is present. IMPRESSION: ICD placement -no evidence of pneumothorax. Cardiomegaly and mild pulmonary vascular congestion again noted. Electronically Signed   By: Harmon Pier M.D.   On: 05/21/2015 10:17    ASSESSMENT AND PLAN  1. Chronic systolic heart failure 2. S/p ICD implant 3. Morbid obesity Rec: will continue IV diuresis.  Follow weights and electrolytes. Patient admits to dietary indiscretion.  Gregg Taylor,M.D.  05/22/2015 9:54 AM

## 2015-05-23 DIAGNOSIS — I429 Cardiomyopathy, unspecified: Secondary | ICD-10-CM | POA: Diagnosis not present

## 2015-05-23 LAB — BASIC METABOLIC PANEL
Anion gap: 9 (ref 5–15)
BUN: 16 mg/dL (ref 6–20)
CO2: 27 mmol/L (ref 22–32)
Calcium: 8.8 mg/dL — ABNORMAL LOW (ref 8.9–10.3)
Chloride: 99 mmol/L — ABNORMAL LOW (ref 101–111)
Creatinine, Ser: 1.43 mg/dL — ABNORMAL HIGH (ref 0.61–1.24)
GFR calc Af Amer: >60 mL/min (ref >60)
GFR, EST NON AFRICAN AMERICAN: 54 mL/min — AB (ref >60)
GLUCOSE: 97 mg/dL (ref 65–99)
POTASSIUM: 5 mmol/L (ref 3.5–5.1)
Sodium: 135 mmol/L (ref 135–145)

## 2015-05-23 NOTE — Discharge Summary (Signed)
Physician Discharge Summary  Patient ID: Eric Murillo MRN: [id number] DOB/AGE: 1961/05/26 54 y.o.   Primary Cardiologist: Dr. Arville Lime Electrophysiologist: Dr. Graciela Husbands   Admit date: 05/21/2015 Discharge date: 05/23/2015  Admission Diagnoses: Nonischemic Cardiomyopathy, Chronic Systolic NYHA Class III CHF  Discharge Diagnoses:  Active Problems:   CHF (congestive heart failure), NYHA class III [facility])   Discharged Condition: stable  Hospital Course: the patient is a 54 y/o male followed by Dr. Graciela Husbands who presented to [facility] on 05/21/15 for elective ICD implantation for primary prevention in the setting of nonischemic cardiomyopathy and chronic systolic NYHA Class III CHF, despite maximum medical therapy. The procedure was performed by Dr. Graciela Husbands. He underwent successful insertion of a Environmental manager single chamber ICD. He tolerated the procedure well. He had no post surgical complications. He denied CP. Post operative CXR showed proper lead placement and no pneumothorax. Device interrogation revealed normal functioning. His device pocket remained stable. He was however noted to be volume overloaded and required an additional day for treatment with IV lasix. He had a good diuretic response back to euvolemic state. He was last seen and examined by Dr. Ladona Ridgel who determined he was stable for discharge home. Wound check f/u has been arranged with on 05/31/15. He will f/u with Dr. Graciela Husbands. All of his home meds were continued.   Consults: None   Treatments: See Hospital Course  Discharge Exam: Blood pressure 115/71, pulse 88, temperature 98.2 F (36.8 C), temperature source Oral, resp. rate 16, height 5' 9.5" (1.765 m), weight 327 lb 1.6 oz (148.372 kg), SpO2 97 %.  Disposition: 01-Home or Self Care      Discharge Instructions    Diet - low sodium heart healthy    Complete by:  As directed      Increase activity slowly    Complete by:  As directed             Medication  List    TAKE these medications        allopurinol 100 MG tablet  Commonly known as:  ZYLOPRIM  Take 1 tablet (100 mg total) by mouth 2 (two) times daily.     carvedilol 25 MG tablet  Commonly known as:  COREG  TAKE 1 TABLET BY MOUTH TWICE A DAY WITH A MEAL     colchicine 0.6 MG tablet  Take 1 tablet (0.6 mg total) by mouth 2 (two) times daily.     cyclobenzaprine 10 MG tablet  Commonly known as:  FLEXERIL  Take 1 tablet (10 mg total) by mouth 2 (two) times daily as needed for muscle spasms.     ELIQUIS 5 MG Tabs tablet  Generic drug:  apixaban  TAKE 1 TABLET BY MOUTH TWICE A DAY     isosorbide-hydrALAZINE 20-37.5 MG tablet  Commonly known as:  BIDIL  Take 2 tablets by mouth 3 (three) times daily.     KLOR-CON M20 20 MEQ tablet  Generic drug:  potassium chloride SA  TAKE 1 TABLET BY MOUTH TWICE A DAY     oxyCODONE-acetaminophen 10-325 MG tablet  Commonly known as:  PERCOCET  Take 1 tablet by mouth every 6 (six) hours as needed for pain.     simvastatin 40 MG tablet  Commonly known as:  ZOCOR  TAKE 1 TABLET (40 MG TOTAL) BY MOUTH AT BEDTIME.     spironolactone 25 MG tablet  Commonly known as:  ALDACTONE  Take 1 tablet (25 mg total) by mouth daily.  torsemide 20 MG tablet  Commonly known as:  DEMADEX  3 tabs in the AM and 2 tabs in the PM       Follow-up Information    Follow up with [facility] On 06/10/2015.   Specialty:  Cardiology   Why:  For wound re-check 11:00 AM   Contact information:   [address] [place] [address] [phone]     TIME SPENT ON DISCHARGE, INCLUDING PHYSICIAN TIME: >30 MINUTES  Signed: Robbie Lis 05/23/2015, 10:19 AM  EP Attending  Patient seen and examined. Agree with above.  Leonia Reeves.D.

## 2015-05-23 NOTE — Progress Notes (Signed)
Patient ID: [name], male   DOB: [birth year], 54 y.o.   MRN: [id number]    Patient Name: Eric Murillo Date of Encounter: 05/23/2015     Active Problems:   CHF (congestive heart failure), NYHA class III (HCC)    SUBJECTIVE  Dyspnea improved. Weight down. Swelling down.  CURRENT MEDS . allopurinol  100 mg Oral BID  . apixaban  5 mg Oral BID  . carvedilol  25 mg Oral BID WC  . colchicine  0.6 mg Oral BID  . furosemide  80 mg Intravenous BID  . isosorbide-hydrALAZINE  2 tablet Oral TID  . potassium chloride SA  20 mEq Oral BID  . spironolactone  25 mg Oral Daily    OBJECTIVE  Filed Vitals:   05/22/15 0439 05/22/15 1500 05/22/15 2024 05/23/15 0528  BP: 123/67 119/72 128/74 115/71  Pulse: 84 88 102 88  Temp: 98.5 F (36.9 C)  98.1 F (36.7 C) 98.2 F (36.8 C)  TempSrc: Oral  Oral Oral  Resp: Height:      Weight: 331 lb 3.2 oz (150.231 kg)   327 lb 1.6 oz (148.372 kg)  SpO2: 98% 99% 97% 97%    Intake/Output Summary (Last 24 hours) at 05/23/15 0821 Last data filed at 05/23/15 0532  Gross per 24 hour  Intake    360 ml  Output   2725 ml  Net  -2365 ml   Filed Weights   05/21/15 0545 05/22/15 0439 05/23/15 0528  Weight: 331 lb (150.141 kg) 331 lb 3.2 oz (150.231 kg) 327 lb 1.6 oz (148.372 kg)    PHYSICAL EXAM  General: Pleasant, NAD. Neuro: Alert and oriented X 3. Moves all extremities spontaneously. Psych: Normal affect. HEENT:  Normal  Neck: Supple without bruits or JVD. Lungs:  Resp regular and unlabored, CTA. Heart: RRR no s3, s4, or murmurs. Abdomen: Soft, non-tender, non-distended, BS + x 4.  Extremities: No clubbing, cyanosis, 1+ peripheral edema. DP/PT/Radials 2+ and equal bilaterally.  Accessory Clinical Findings  CBC No results for input(s): WBC, NEUTROABS, HGB, HCT, MCV, PLT in the last 72 hours. Basic Metabolic Panel  Recent Labs  05/23/15 0351  NA 135  K 5.0  CL 99*  CO2 27  GLUCOSE 97  BUN 16  CREATININE 1.43*    CALCIUM 8.8*   Liver Function Tests No results for input(s): AST, ALT, ALKPHOS, BILITOT, PROT, ALBUMIN in the last 72 hours. No results for input(s): LIPASE, AMYLASE in the last 72 hours. Cardiac Enzymes No results for input(s): CKTOTAL, CKMB, CKMBINDEX, TROPONINI in the last 72 hours. BNP Invalid input(s): POCBNP D-Dimer No results for input(s): DDIMER in the last 72 hours. Hemoglobin A1C No results for input(s): HGBA1C in the last 72 hours. Fasting Lipid Panel No results for input(s): CHOL, HDL, LDLCALC, TRIG, CHOLHDL, LDLDIRECT in the last 72 hours. Thyroid Function Tests No results for input(s): TSH, T4TOTAL, T3FREE, THYROIDAB in the last 72 hours.  Invalid input(s): FREET3  TELE NSR  Radiology/Studies  Dg Chest 2 View  05/22/2015  CLINICAL DATA:  Cardiac device in situ. Pt unable to lift left arm due to MD's orders. Best obtainable images. EXAM: CHEST - 2 VIEW COMPARISON:  the previous day's study FINDINGS: Left subclavian AICD stable in position, lead extending towards the right ventricular apex. No pneumothorax. Lungs clear. Stable cardiomegaly. No effusion. Visualized skeletal structures are unremarkable. IMPRESSION: 1. Stable mild cardiomegaly.  No acute disease. Electronically Signed   By: Ronald Pippins.D.  On: 05/22/2015 10:44   Dg Chest Port 1 View  05/21/2015  CLINICAL DATA:  54 year old male with ICD placement. EXAM: PORTABLE CHEST 1 VIEW COMPARISON:  04/20/2014 and prior radiographs FINDINGS: Cardiomegaly again identified. A left ICD is noted with with tip overlying interseptal region on this frontal view. There is no evidence of pneumothorax, edema or pleural effusion. Mild pulmonary vascular congestion is present. IMPRESSION: ICD placement -no evidence of pneumothorax. Cardiomegaly and mild pulmonary vascular congestion again noted. Electronically Signed   By: Harmon Pier M.D.   On: 05/21/2015 10:17    ASSESSMENT AND PLAN  1. S/p ICD implant 2. Admit for iv  diuresis due to worsening CHF 3. Morbid obesity Rec: his kidney function is stable and weight is down. He is stable for discharge home. He may discharged on all of his home meds. He will need early followup to assess his weight and kidney function in 6-8 days. Usual followup with the device clinic and Dr. Arta Silence Taylor,M.D.  05/23/2015 8:21 AM

## 2015-05-23 NOTE — Progress Notes (Signed)
Patient D/C to home with girlfriend. Handout given to discuss activity level increase. Education done on CHF and fluid intake. IV removed. Tele box removed. Patient belongings given to patient and wheeled to front to D/C with girlfriend.   Valinda Hoar RN

## 2015-05-31 ENCOUNTER — Ambulatory Visit (INDEPENDENT_AMBULATORY_CARE_PROVIDER_SITE_OTHER): Payer: Medicaid Other | Admitting: *Deleted

## 2015-05-31 ENCOUNTER — Other Ambulatory Visit: Payer: Self-pay | Admitting: *Deleted

## 2015-05-31 DIAGNOSIS — I5022 Chronic systolic (congestive) heart failure: Secondary | ICD-10-CM

## 2015-05-31 DIAGNOSIS — I428 Other cardiomyopathies: Secondary | ICD-10-CM

## 2015-05-31 DIAGNOSIS — I429 Cardiomyopathy, unspecified: Secondary | ICD-10-CM

## 2015-05-31 DIAGNOSIS — M25572 Pain in left ankle and joints of left foot: Secondary | ICD-10-CM

## 2015-05-31 LAB — CUP PACEART INCLINIC DEVICE CHECK
Brady Statistic RV Percent Paced: 0.02 %
HIGH POWER IMPEDANCE MEASURED VALUE: 60 Ohm
HighPow Impedance: 59.625
Implantable Lead Implant Date: 20161028
Implantable Lead Location: 753860
Lead Channel Impedance Value: 362.5 Ohm
Lead Channel Pacing Threshold Amplitude: 0.5 V
Lead Channel Pacing Threshold Amplitude: 0.5 V
Lead Channel Pacing Threshold Pulse Width: 0.5 ms
Lead Channel Setting Pacing Amplitude: 3.5 V
MDC IDC LEAD MODEL: 181
MDC IDC LEAD SERIAL: 332395
MDC IDC MSMT BATTERY REMAINING LONGEVITY: 97.2
MDC IDC MSMT LEADCHNL RV PACING THRESHOLD PULSEWIDTH: 0.5 ms
MDC IDC MSMT LEADCHNL RV SENSING INTR AMPL: 12 mV
MDC IDC SESS DTM: 20161107112620
MDC IDC SET LEADCHNL RV PACING PULSEWIDTH: 0.5 ms
MDC IDC SET LEADCHNL RV SENSING SENSITIVITY: 0.5 mV
Pulse Gen Serial Number: 7305918

## 2015-05-31 NOTE — Progress Notes (Signed)
Wound check appointment. Dermabond mostly removed, additional removal wipes & instructions given to pt. Wound without redness or edema. Incision edges approximated, wound well healed. Normal device function. Threshold, sensing, and impedances consistent with implant measurements. Device programmed at 3.5V for extra safety margin until 3 month visit. Histogram distribution appropriate for patient and level of activity. No ventricular arrhythmias noted. Patient educated about wound care, arm mobility, lifting restrictions, shock plan. ROV w/ SK 08/31/15.

## 2015-06-03 ENCOUNTER — Other Ambulatory Visit: Payer: Self-pay | Admitting: Internal Medicine

## 2015-06-03 ENCOUNTER — Telehealth: Payer: Self-pay | Admitting: Internal Medicine

## 2015-06-03 MED ORDER — OXYCODONE-ACETAMINOPHEN 10-325 MG PO TABS: 1.0000 | ORAL_TABLET | Freq: Four times a day (QID) | ORAL | Status: DC | PRN

## 2015-06-03 NOTE — Telephone Encounter (Signed)
Pt aware Rx is ready. 

## 2015-06-09 ENCOUNTER — Ambulatory Visit (INDEPENDENT_AMBULATORY_CARE_PROVIDER_SITE_OTHER): Payer: Medicaid Other | Admitting: Internal Medicine

## 2015-06-09 VITALS — BP 118/72 | HR 98 | Temp 98.6°F | Wt 334.5 lb

## 2015-06-09 DIAGNOSIS — Z95810 Presence of automatic (implantable) cardiac defibrillator: Secondary | ICD-10-CM

## 2015-06-09 DIAGNOSIS — M545 Low back pain, unspecified: Secondary | ICD-10-CM

## 2015-06-09 DIAGNOSIS — I5022 Chronic systolic (congestive) heart failure: Secondary | ICD-10-CM

## 2015-06-09 NOTE — Patient Instructions (Signed)
It was a pleasure to see you Eric Murillo.  Please check your weight daily and exercise as tolerated at least 30 minutes 3 days a week. Please try to limit your fluid intake to 2 Liters per day.

## 2015-06-10 NOTE — Assessment & Plan Note (Signed)
Patient s/p ICD placement on 05/21/15. He has been less active since placement and with a 7 pound weight gain since 10/30. He reports being cleared to resume his regular exercise activity recently, and plans to start walking more. He does report inconsistencies in his diet and is continuing to work on this. He noticed some increased swelling in his legs several days ago, but says this is improving. He denies any CP, SOB, cough, or orthopnea. He does mention some pain in his leg after walking for awhile, but it seems that this is more localized to his ankle and not typical claudication symptoms. On physical exam, his lungs are CTA and he has +1 pitting edema bilaterally. -Patient advised on limiting salt and fluid intake -Daily weight checks at home -Patient to resume exercise activity, says he is motivated to lose weight

## 2015-06-10 NOTE — Assessment & Plan Note (Signed)
Patient has complaint of right sided back pain which he describes as a "sore" sensation. He says the pain is most noticeable when he gets up to walk around. He experiences no pain at rest. He says the pain is relieved with his opioid medication. He denies any injury to the area. On physical exam, his ROM is intact and there is no tenderness to palpation of the spinous processes, paraspinal muscles, or hips. There is no rash, bruising, or laceration to the area. His soreness is likely due to his large body habitus putting stress on his back. As his pain is controlled with his current medications, will not add additional medication at this time. -Continue to monitor.

## 2015-06-10 NOTE — Progress Notes (Signed)
Patient ID: Eric Murillo, male   DOB: 1961-04-30, 54 y.o.   MRN: [id number]   Subjective:   Patient ID: Eric Murillo male   DOB: November 16, 1960 54 y.o.   MRN: [id number]  HPI: Eric Murillo is a 54 y.o. with PMH as listed below who presents for management of his back pain. Please see problem list for status of patient's chronic medical issues.    Past Medical History  Diagnosis Date  . Hypertension   . Hyperlipidemia   . Chronic systolic congestive heart failure, NYHA class 2 (HCC)     EF 20-25% 1/16  . Organic erectile dysfunction   . Morbid obesity with BMI of 45.0-49.9, adult (HCC)   . Gout     Of big toe  . CKD (chronic kidney disease) stage 3, GFR 30-59 ml/min   . Atrial flutter (HCC)     DCCV 9/15  . Non-ischemic cardiomyopathy (HCC)   . OSA (obstructive sleep apnea)   . Submandibular sialolithiasis     Right  . Pilonidal cyst   . Dyslipidemia   . Anemia of chronic disease   . Chest pain on exertion   . Hyperglycemia   . Arthritis    Current Outpatient Prescriptions  Medication Sig Dispense Refill  . allopurinol (ZYLOPRIM) 100 MG tablet TAKE 1 TABLET (100 MG TOTAL) BY MOUTH 2 (TWO) TIMES DAILY. 60 tablet 2  . carvedilol (COREG) 25 MG tablet TAKE 1 TABLET BY MOUTH TWICE A DAY WITH A MEAL (Patient taking differently: TAKE 25 MG BY MOUTH TWICE A DAY WITH A MEAL) 60 tablet 6  . colchicine 0.6 MG tablet Take 1 tablet (0.6 mg total) by mouth 2 (two) times daily. 60 tablet 0  . ELIQUIS 5 MG TABS tablet TAKE 1 TABLET BY MOUTH TWICE A DAY (Patient taking differently: TAKE 5 MG BY MOUTH TWICE A DAY) 60 tablet 6  . isosorbide-hydrALAZINE (BIDIL) 20-37.5 MG per tablet Take 2 tablets by mouth 3 (three) times daily. 180 tablet 3  . KLOR-CON M20 20 MEQ tablet TAKE 1 TABLET BY MOUTH TWICE A DAY (Patient taking differently: TAKE 20 MEQ BY MOUTH TWICE A DAY) 60 tablet 3  . oxyCODONE-acetaminophen (PERCOCET) 10-325 MG tablet Take 1 tablet by mouth every 6 (six) hours as needed for  pain. 90 tablet 0  . simvastatin (ZOCOR) 40 MG tablet TAKE 1 TABLET (40 MG TOTAL) BY MOUTH AT BEDTIME. 30 tablet 2  . spironolactone (ALDACTONE) 25 MG tablet Take 1 tablet (25 mg total) by mouth daily. 30 tablet 6  . torsemide (DEMADEX) 20 MG tablet 3 tabs in the AM and 2 tabs in the PM (Patient taking differently: Take 40-60 mg by mouth 2 (two) times daily. Take 60 mg by mouth in the morning and take 40 mg by mouth in the evening)     No current facility-administered medications for this visit.   Family History  Problem Relation Age of Onset  . Hypertension Mother   . Hypertension Father   . Cancer Father     brother died of brain cancer; sister died of bone cancer  . Diabetes Sister   . Diabetes Brother   . Colon cancer Maternal Aunt   . Cardiomyopathy Neg Hx    Social History   Social History  . Marital Status: Single    Spouse Name: N/A  . Number of Children: N/A  . Years of Education: N/A   Social History Main Topics  . Smoking status: Never Smoker   . Smokeless  tobacco: Never Used  . Alcohol Use: No  . Drug Use: No  . Sexual Activity: Not on file   Other Topics Concern  . Not on file   Social History Narrative   Financial assistance approved for 100% discount at [facility] and has [facility] card   [employer]  March 16, 2010 9:42 AM      Lives with mother.  Has a girlfriend   Review of Systems: Review of Systems  Constitutional: Negative for fever and chills.  Respiratory: Negative for cough and shortness of breath.   Cardiovascular: Positive for leg swelling. Negative for chest pain and palpitations.  Gastrointestinal: Negative for nausea, vomiting, abdominal pain, diarrhea and constipation.  Musculoskeletal: Positive for back pain. Negative for falls.  Neurological: Negative for dizziness.    Objective:  Physical Exam: Filed Vitals:   06/09/15 1317  BP: 118/72  Pulse: 98  Temp: 98.6 F (37 C)  TempSrc: Oral  Weight: 334 lb 8 oz (151.728 kg)  SpO2: 98%    Physical Exam  Constitutional: He is oriented to person, place, and time. He appears well-developed and well-nourished.  Please, obese gentleman, NAD  HENT:  Head: Normocephalic and atraumatic.  Cardiovascular: Normal rate and regular rhythm.   ICD placement site well healed, clean, dry, intact   Pulmonary/Chest: Effort normal and breath sounds normal. No respiratory distress. He has no wheezes. He exhibits no tenderness.  Abdominal: Soft. There is no tenderness.  Musculoskeletal: Normal range of motion. He exhibits no tenderness.  +1 pitting edema bilaterally  Neurological: He is alert and oriented to person, place, and time.  Skin: Skin is warm. No rash noted.  Psychiatric: He has a normal mood and affect.    Assessment & Plan:  Please see problem based charting for assessment and plan.

## 2015-06-11 NOTE — Progress Notes (Signed)
Internal Medicine Clinic Attending  I saw and evaluated the patient.  I personally confirmed the key portions of the history and exam documented by Dr. Patel,Vishal and I reviewed pertinent patient test results.  The assessment, diagnosis, and plan were formulated together and I agree with the documentation in the resident's note.  

## 2015-06-25 ENCOUNTER — Encounter: Payer: Self-pay | Admitting: Internal Medicine

## 2015-06-29 ENCOUNTER — Other Ambulatory Visit: Payer: Self-pay | Admitting: Internal Medicine

## 2015-06-29 DIAGNOSIS — M25572 Pain in left ankle and joints of left foot: Secondary | ICD-10-CM

## 2015-06-29 NOTE — Telephone Encounter (Signed)
Pt requesting oxycodone to be filled. °

## 2015-06-29 NOTE — Telephone Encounter (Signed)
Last refill 11/10 Last OV 11/16 UDS 01/01/2015 Future PCP visit 12/2015

## 2015-06-30 MED ORDER — OXYCODONE-ACETAMINOPHEN 10-325 MG PO TABS: 1.0000 | ORAL_TABLET | Freq: Four times a day (QID) | ORAL | Status: DC | PRN

## 2015-06-30 NOTE — Telephone Encounter (Signed)
Pt informed

## 2015-07-17 ENCOUNTER — Other Ambulatory Visit (HOSPITAL_COMMUNITY): Payer: Self-pay | Admitting: Cardiology

## 2015-07-17 ENCOUNTER — Other Ambulatory Visit (HOSPITAL_COMMUNITY): Payer: Self-pay | Admitting: Internal Medicine

## 2015-07-24 ENCOUNTER — Emergency Department (HOSPITAL_COMMUNITY): Payer: Medicaid Other

## 2015-07-24 ENCOUNTER — Encounter (HOSPITAL_COMMUNITY): Payer: Self-pay

## 2015-07-24 ENCOUNTER — Emergency Department (HOSPITAL_COMMUNITY)
Admission: EM | Admit: 2015-07-24 | Discharge: 2015-07-24 | Disposition: A | Discharge status: Home / Self Care | Payer: Medicaid Other | Attending: Emergency Medicine | Admitting: Emergency Medicine

## 2015-07-24 DIAGNOSIS — Z87438 Personal history of other diseases of male genital organs: Secondary | ICD-10-CM | POA: Diagnosis not present

## 2015-07-24 DIAGNOSIS — N183 Chronic kidney disease, stage 3 (moderate): Secondary | ICD-10-CM | POA: Insufficient documentation

## 2015-07-24 DIAGNOSIS — E785 Hyperlipidemia, unspecified: Secondary | ICD-10-CM | POA: Insufficient documentation

## 2015-07-24 DIAGNOSIS — R0600 Dyspnea, unspecified: Secondary | ICD-10-CM | POA: Insufficient documentation

## 2015-07-24 DIAGNOSIS — Z8669 Personal history of other diseases of the nervous system and sense organs: Secondary | ICD-10-CM | POA: Diagnosis not present

## 2015-07-24 DIAGNOSIS — Z7902 Long term (current) use of antithrombotics/antiplatelets: Secondary | ICD-10-CM | POA: Insufficient documentation

## 2015-07-24 DIAGNOSIS — M109 Gout, unspecified: Secondary | ICD-10-CM | POA: Insufficient documentation

## 2015-07-24 DIAGNOSIS — M1990 Unspecified osteoarthritis, unspecified site: Secondary | ICD-10-CM | POA: Insufficient documentation

## 2015-07-24 DIAGNOSIS — Z8719 Personal history of other diseases of the digestive system: Secondary | ICD-10-CM | POA: Insufficient documentation

## 2015-07-24 DIAGNOSIS — Z872 Personal history of diseases of the skin and subcutaneous tissue: Secondary | ICD-10-CM | POA: Diagnosis not present

## 2015-07-24 DIAGNOSIS — R079 Chest pain, unspecified: Secondary | ICD-10-CM | POA: Diagnosis present

## 2015-07-24 DIAGNOSIS — Z79899 Other long term (current) drug therapy: Secondary | ICD-10-CM | POA: Diagnosis not present

## 2015-07-24 DIAGNOSIS — Z862 Personal history of diseases of the blood and blood-forming organs and certain disorders involving the immune mechanism: Secondary | ICD-10-CM | POA: Insufficient documentation

## 2015-07-24 DIAGNOSIS — I129 Hypertensive chronic kidney disease with stage 1 through stage 4 chronic kidney disease, or unspecified chronic kidney disease: Secondary | ICD-10-CM | POA: Insufficient documentation

## 2015-07-24 DIAGNOSIS — I5022 Chronic systolic (congestive) heart failure: Secondary | ICD-10-CM | POA: Insufficient documentation

## 2015-07-24 LAB — BASIC METABOLIC PANEL
Anion gap: 9 (ref 5–15)
BUN: 22 mg/dL — ABNORMAL HIGH (ref 6–20)
CO2: 31 mmol/L (ref 22–32)
Calcium: 8.7 mg/dL — ABNORMAL LOW (ref 8.9–10.3)
Chloride: 103 mmol/L (ref 101–111)
Creatinine, Ser: 2.04 mg/dL — ABNORMAL HIGH (ref 0.61–1.24)
GFR calc Af Amer: 41 mL/min — ABNORMAL LOW (ref >60)
GFR calc non Af Amer: 35 mL/min — ABNORMAL LOW (ref >60)
Glucose, Bld: 134 mg/dL — ABNORMAL HIGH (ref 65–99)
Potassium: 3.9 mmol/L (ref 3.5–5.1)
Sodium: 143 mmol/L (ref 135–145)

## 2015-07-24 LAB — CBC
HCT: 37.9 % — ABNORMAL LOW (ref 39.0–52.0)
Hemoglobin: 11.7 g/dL — ABNORMAL LOW (ref 13.0–17.0)
MCH: 28 pg (ref 26.0–34.0)
MCHC: 30.9 g/dL (ref 30.0–36.0)
MCV: 90.7 fL (ref 78.0–100.0)
Platelets: 170 10*3/uL (ref 150–400)
RBC: 4.18 MIL/uL — ABNORMAL LOW (ref 4.22–5.81)
RDW: 13.8 % (ref 11.5–15.5)
WBC: 9.5 10*3/uL (ref 4.0–10.5)

## 2015-07-24 LAB — I-STAT TROPONIN, ED: Troponin i, poc: 0.03 ng/mL (ref 0.00–0.08)

## 2015-07-24 LAB — BRAIN NATRIURETIC PEPTIDE: B Natriuretic Peptide: 354.7 pg/mL — ABNORMAL HIGH (ref 0.0–100.0)

## 2015-07-24 MED ORDER — FUROSEMIDE 10 MG/ML IJ SOLN
60.0000 mg | Freq: Once | INTRAMUSCULAR | Status: AC
  Administered 2015-07-24: 60 mg via INTRAVENOUS
  Filled 2015-07-24: qty 6

## 2015-07-24 NOTE — ED Provider Notes (Signed)
CSN: [id number]     Arrival date & time 07/24/15  1159 History   First MD Initiated Contact with Patient 07/24/15 1328     Chief Complaint  Patient presents with  . Chest Pain     (Consider location/radiation/quality/duration/timing/severity/associated sxs/prior Treatment) HPI   54 year old male with increasing dyspnea. Worsening over past 2 weeks. Worse with exertion resolves with rest. Associated tightness in his throat/neck which is constant and more noticeable when leaning back. Patient has a past history nonischemic cardiomyopathy, systolic heart failure status post ICD placement in October. He reports compliance with his medications. He is noncompliant with regards to fluid restriction or other dietary restrictions. He does not weigh himself regularly. States the last time he was weighed was "probably when you guys last did it." Has not felt like AICD has fired. Denies pre-syncopal symptoms.   Past Medical History  Diagnosis Date  . Hypertension   . Hyperlipidemia   . Chronic systolic congestive heart failure, NYHA class 2 (HCC)     EF 20-25% 1/16  . Organic erectile dysfunction   . Morbid obesity with BMI of 45.0-49.9, adult (HCC)   . Gout     Of big toe  . CKD (chronic kidney disease) stage 3, GFR 30-59 ml/min   . Atrial flutter (HCC)     DCCV 9/15  . Non-ischemic cardiomyopathy (HCC)   . OSA (obstructive sleep apnea)   . Submandibular sialolithiasis     Right  . Pilonidal cyst   . Dyslipidemia   . Anemia of chronic disease   . Chest pain on exertion   . Hyperglycemia   . Arthritis    Past Surgical History  Procedure Laterality Date  . Colonoscopy    . Salivary gland surgery  09/12/2012  . Cardiac catheterization  2012  . Submandibular gland excision Right 09/12/2012    Procedure: Removal Right Submandibular Larina Bras;  Surgeon: Serena Colonel, MD;  Location: [facility] OR;  Service: ENT;  Laterality: Right;  . Tee without cardioversion N/A 04/03/2014    Procedure:  TRANSESOPHAGEAL ECHOCARDIOGRAM (TEE);  Surgeon: Laurey Morale, MD;  Location: [facility] ENDOSCOPY;  Service: Cardiovascular;  Laterality: N/A;  . Cardioversion N/A 04/03/2014    Procedure: CARDIOVERSION;  Surgeon: Laurey Morale, MD;  Location: [facility] ENDOSCOPY;  Service: Cardiovascular;  Laterality: N/A;  . Left and right heart catheterization with coronary angiogram N/A 04/24/2014    Procedure: LEFT AND RIGHT HEART CATHETERIZATION WITH CORONARY ANGIOGRAM;  Surgeon: Laurey Morale, MD;  Location: [facility] CATH LAB;  Service: Cardiovascular;  Laterality: N/A;  . Ep implantable device N/A 05/21/2015    Procedure: ICD Implant;  Surgeon: Duke Salvia, MD;  Location: [facility] INVASIVE CV LAB;  Service: Cardiovascular;  Laterality: N/A;   Family History  Problem Relation Age of Onset  . Hypertension Mother   . Hypertension Father   . Cancer Father     brother died of brain cancer; sister died of bone cancer  . Diabetes Sister   . Diabetes Brother   . Colon cancer Maternal Aunt   . Cardiomyopathy Neg Hx    Social History  Substance Use Topics  . Smoking status: Never Smoker   . Smokeless tobacco: Never Used  . Alcohol Use: No    Review of Systems  All systems reviewed and negative, other than as noted in HPI.   Allergies  Beta adrenergic blockers  Home Medications   Prior to Admission medications   Medication Sig Start Date End Date Taking? Authorizing Provider  allopurinol (ZYLOPRIM) 100 MG tablet TAKE 1 TABLET (100 MG TOTAL) BY MOUTH 2 (TWO) TIMES DAILY. 05/24/15  Yes Darreld Mclean, MD  carvedilol (COREG) 25 MG tablet TAKE 1 TABLET BY MOUTH TWICE A DAY WITH A MEAL Patient taking differently: TAKE 25 MG BY MOUTH TWICE A DAY WITH A MEAL 11/06/14  Yes Dolores Patty, MD  colchicine 0.6 MG tablet Take 1 tablet (0.6 mg total) by mouth 2 (two) times daily. 07/27/14  Yes Bevelyn Buckles Bensimhon, MD  ELIQUIS 5 MG TABS tablet TAKE 1 TABLET BY MOUTH TWICE A DAY Patient taking differently: TAKE 5 MG BY MOUTH  TWICE A DAY 11/27/14  Yes Bevelyn Buckles Bensimhon, MD  isosorbide-hydrALAZINE (BIDIL) 20-37.5 MG per tablet Take 2 tablets by mouth 3 (three) times daily. 02/11/15  Yes Laurey Morale, MD  KLOR-CON M20 20 MEQ tablet TAKE 1 TABLET BY MOUTH TWICE A DAY Patient taking differently: TAKE 20 MEQ BY MOUTH TWICE A DAY 10/26/14  Yes Dolores Patty, MD  oxyCODONE-acetaminophen (PERCOCET) 10-325 MG tablet Take 1 tablet by mouth every 6 (six) hours as needed for pain. 06/30/15  Yes Darreld Mclean, MD  simvastatin (ZOCOR) 40 MG tablet TAKE 1 TABLET (40 MG TOTAL) BY MOUTH AT BEDTIME. 05/13/15  Yes Dolores Patty, MD  spironolactone (ALDACTONE) 25 MG tablet TAKE 1 TABLET (25 MG TOTAL) BY MOUTH DAILY. 06/30/15  Yes Dolores Patty, MD  torsemide (DEMADEX) 20 MG tablet 3 tabs in the AM and 2 tabs in the PM Patient taking differently: Take 40-60 mg by mouth 2 (two) times daily. Take 60 mg by mouth in the morning and take 40 mg by mouth in the evening 04/15/15  Yes Dolores Patty, MD  torsemide (DEMADEX) 20 MG tablet TAKE 3 TABLETS BY MOUTH TWICE A DAY 07/20/15  Yes Laurey Morale, MD   BP 123/85 mmHg  Pulse 83  Temp(Src) 99.9 F (37.7 C) (Oral)  Resp 24  SpO2 96% Physical Exam  Constitutional: He appears well-developed and well-nourished. No distress.  Laying in bed. No acute distress. Obese.  HENT:  Head: Normocephalic and atraumatic.  Eyes: Conjunctivae are normal. Right eye exhibits no discharge. Left eye exhibits no discharge.  Neck: Neck supple.  Cardiovascular: Normal rate, regular rhythm and normal heart sounds.  Exam reveals no gallop and no friction rub.   No murmur heard. Pulmonary/Chest: Effort normal and breath sounds normal. No respiratory distress.  Abdominal: Soft. He exhibits no distension. There is no tenderness.  Musculoskeletal: He exhibits edema. He exhibits no tenderness.  Symmetric pitting LE edema  Neurological: He is alert.  Skin: Skin is warm and dry.  Psychiatric: He has a  normal mood and affect. His behavior is normal. Thought content normal.  Nursing note and vitals reviewed.   ED Course  Procedures (including critical care time) Labs Review Labs Reviewed  BASIC METABOLIC PANEL - Abnormal; Notable for the following:    Glucose, Bld 134 (*)    BUN 22 (*)    Creatinine, Ser 2.04 (*)    Calcium 8.7 (*)    GFR calc non Af Amer 35 (*)    GFR calc Af Amer 41 (*)    All other components within normal limits  CBC - Abnormal; Notable for the following:    RBC 4.18 (*)    Hemoglobin 11.7 (*)    HCT 37.9 (*)    All other components within normal limits  BRAIN NATRIURETIC PEPTIDE - Abnormal; Notable for the following:  B Natriuretic Peptide 354.7 (*)    All other components within normal limits  I-STAT TROPOININ, ED    Imaging Review Dg Chest 2 View  07/24/2015  CLINICAL DATA:  Chest tightness and shortness of breath. EXAM: CHEST  2 VIEW COMPARISON:  05/22/2015 FINDINGS: AICD in place. Cardiomegaly. Slight pulmonary vascular congestion. No infiltrates or effusions. No acute osseous abnormality. IMPRESSION: Chronic cardiomegaly.  Slight pulmonary vascular congestion. Electronically Signed   By: Francene Boyers M.D.   On: 07/24/2015 13:15   I have personally reviewed and evaluated these images and lab results as part of my medical decision-making.   EKG Interpretation   Date/Time:  Saturday July 24 2015 11:59:52 EST Ventricular Rate:  87 PR Interval:  234 QRS Duration: 110 QT Interval:  386 QTC Calculation: 464 R Axis:   -167 Text Interpretation:  Sinus rhythm with 1st degree A-V block with Blocked  Premature atrial complexes with occasional and consecutive Premature  ventricular complexes Low voltage QRS Inferior infarct , age undetermined  Anterolateral infarct , age undetermined No significant change since last  tracing Confirmed by Juleen China  MD, Tiasha Helvie ([id number]) on 07/24/2015 4:33:25 PM      MDM   Final diagnoses:  Dyspnea     54 year old male with dyspnea. History nonischemic cardiomyopathy/systolic heart failure. Reports compliance with this medication but is not compliant with regards to fluid or dietary restrictions. Clinically volume overloaded. He appears relatively comfortable at rest though. Oxygen saturations in the mid 90s on room air. Chest x-ray was congestion but no florid edema. Temperature noted to be 99.9. Occasional cough but not acutely changes or productive.. No focal infiltrate on x-ray. No leukocytosis.  He was given a dose of Lasix in the emergency room. Advised to increase his home dose of torsemide for the next 3 days. Would like him to follow-up with the heart failure clinic this upcoming week if possible. At this point I do not feel that he requires admission to the hospital. Return precautions were discussed.  Raeford Razor, MD 07/30/15 1208

## 2015-07-24 NOTE — Discharge Instructions (Signed)
Increase your demadex to 4 pills in morning and 3 pills in for the next 3 days. Follow-up in the heart failure clinic next week.   Shortness of Breath Shortness of breath means you have trouble breathing. It could also mean that you have a medical problem. You should get immediate medical care for shortness of breath. CAUSES   Not enough oxygen in the air such as with high altitudes or a smoke-filled room.  Certain lung diseases, infections, or problems.  Heart disease or conditions, such as angina or heart failure.  Low red blood cells (anemia).  Poor physical fitness, which can cause shortness of breath when you exercise.  Chest or back injuries or stiffness.  Being overweight.  Smoking.  Anxiety, which can make you feel like you are not getting enough air. DIAGNOSIS  Serious medical problems can often be found during your physical exam. Tests may also be done to determine why you are having shortness of breath. Tests may include:  Chest X-rays.  Lung function tests.  Blood tests.  An electrocardiogram (ECG).  An ambulatory electrocardiogram. An ambulatory ECG records your heartbeat patterns over a 24-hour period.  Exercise testing.  A transthoracic echocardiogram (TTE). During echocardiography, sound waves are used to evaluate how blood flows through your heart.  A transesophageal echocardiogram (TEE).  Imaging scans. Your health care provider may not be able to find a cause for your shortness of breath after your exam. In this case, it is important to have a follow-up exam with your health care provider as directed.  TREATMENT  Treatment for shortness of breath depends on the cause of your symptoms and can vary greatly. HOME CARE INSTRUCTIONS   Do not smoke. Smoking is a common cause of shortness of breath. If you smoke, ask for help to quit.  Avoid being around chemicals or things that may bother your breathing, such as paint fumes and dust.  Rest as needed.  Slowly resume your usual activities.  If medicines were prescribed, take them as directed for the full length of time directed. This includes oxygen and any inhaled medicines.  Keep all follow-up appointments as directed by your health care provider. SEEK MEDICAL CARE IF:   Your condition does not improve in the time expected.  You have a hard time doing your normal activities even with rest.  You have any new symptoms. SEEK IMMEDIATE MEDICAL CARE IF:   Your shortness of breath gets worse.  You feel light-headed, faint, or develop a cough not controlled with medicines.  You start coughing up blood.  You have pain with breathing.  You have chest pain or pain in your arms, shoulders, or abdomen.  You have a fever.  You are unable to walk up stairs or exercise the way you normally do. MAKE SURE YOU:  Understand these instructions.  Will watch your condition.  Will get help right away if you are not doing well or get worse.   This information is not intended to replace advice given to you by your health care provider. Make sure you discuss any questions you have with your health care provider.   Document Released: 04/04/2001 Document Revised: 07/15/2013 Document Reviewed: 09/25/2011 Elsevier Interactive Patient Education [employer]

## 2015-07-24 NOTE — ED Notes (Signed)
Pt reports onset 1-2 weeks ago chest tightness and shortness of breath.   Walking makes worse and sitting makes better.  Last for several minutes.

## 2015-07-26 ENCOUNTER — Other Ambulatory Visit (HOSPITAL_COMMUNITY): Payer: Self-pay | Admitting: Internal Medicine

## 2015-07-27 ENCOUNTER — Telehealth (HOSPITAL_COMMUNITY): Payer: Self-pay | Admitting: Vascular Surgery

## 2015-07-27 ENCOUNTER — Telehealth: Payer: Self-pay

## 2015-07-27 NOTE — Telephone Encounter (Signed)
Call to patient after receiving ICM transmission.  Advised of fluid reading results.  Recommendations to follow low salt diet of 2000 mg, fluid intake of 64 oz daily and to weigh daily at the same time with same amount of clothes.  Todays weight 337 and he had not been weighing prior to ED visit.  He plans on weighing daily and education given to call if he has 2-3 pound weight gain overnight or 5 pounds within a week.  Advised if he has urgent symptoms to call 911 or local emergency facility.

## 2015-07-27 NOTE — Telephone Encounter (Signed)
PT called he was seen in ED he was told to call office to make appt this week please advise

## 2015-07-27 NOTE — Telephone Encounter (Signed)
Patient referred to [facility] clinic by Dr Johney Frame, and is patient of Dr Odessa Fleming and Dr Shirlee Latch.    Call to patient and provided ICM introduction and he agreed to monthly follow up.  He reported he was in the ED 07/24/2015 for SOB and chest tightness but was not admitted.  He was instructed to increase Torsemide x 3 days and is waiting on appointment with cardiologist.  He stated he is feeling better today.  Requested he send ICM transmission today since no remote transmissions have been sent.     FYI for Dr Graciela Husbands and Dr Shirlee Latch   ICM trend:  3 month View sent 07/27/2015  Corvue impedance below baseline 07/14/2015 to 07/22/2015 suggesting fluid retention.  Impedance above baseline 07/26/2015 after ED visit and increase of Torsemide.    Repeat transmission on 08/10/2015

## 2015-07-28 ENCOUNTER — Other Ambulatory Visit: Payer: Self-pay | Admitting: Internal Medicine

## 2015-07-28 DIAGNOSIS — M25572 Pain in left ankle and joints of left foot: Secondary | ICD-10-CM

## 2015-07-28 NOTE — Telephone Encounter (Signed)
Pt requesting oxycodone to be filled. °

## 2015-07-28 NOTE — Telephone Encounter (Signed)
Last filled 12/7 Next visit 12/2015 ?

## 2015-07-30 MED ORDER — OXYCODONE-ACETAMINOPHEN 10-325 MG PO TABS: 1.0000 | ORAL_TABLET | Freq: Four times a day (QID) | ORAL | Status: DC | PRN

## 2015-07-30 NOTE — Telephone Encounter (Signed)
Lm w/ mother for rtc

## 2015-08-03 ENCOUNTER — Other Ambulatory Visit: Payer: Self-pay | Admitting: Internal Medicine

## 2015-08-03 NOTE — Progress Notes (Addendum)
Patient ID: Eric Murillo, male   DOB: 11/15/1960, 55 y.o.   MRN: [id number]    Advanced Heart Failure Clinic Note  PCP: Dr. Glenard Haring HF: Mclean  55 yo with history of morbid obesity, hyperlipidemia, HTN, atrial flutter (s/p TEE DC-CV 04/03/14), NICM, chronic systolic HF and severe OSA but unable to tolerate CPAP.     He was admitted in 04/2014 with increased exertional dyspnea and chest tightness.  RHC/LHC showed elevated filling pressures, preserved cardiac output, and no significant coronary disease.  He was diuresed and discharged home.  Weight is up 4 lbs.  TEE (9/15) with EF 25-30% with global hypokinesis, mild to moderate MR, mildly decreased RV systolic function.   Has been undergoing work-up for possible amyloid. SPEP negative. Fat pad biopsy 6/16 with benign adipose tissue. Unable to get cMRI due to size.  Was seen in ED 07/23/54 with SOB and chest tightness. Found to be in CHF in the setting of fluid/dietary non compliance and told to increase torsemide x 3 days and follow up with CHF clinic.   Corvue on 07/27/15 shows that he was overloaded during that ER visit, and improved with the increased torsemide.   He returns today for post ED visit follow up. Says he was nearly 340 when he got home from ED. Weight today 329.6. Not checking weight at home regularly. Thinks he had been drinking much more than 2 L a day, also had a lot of salt over the holidays. Says he doesn't add salt.  Denies SOB. Denies DOE on flat ground. Avoids inclines and steps.  Thinks he does have some mild Orthopnea. No PND, CP, lightheadedness, or dizziness. Does have occasionaly bendopnea.  Uses CPAP very seldomly, hasn't used in past 7 days. Denies any ICD shocks, has had paraesthesias around the site. Flu shot received 04/2015.  Corvue 08/04/15 shows thoracic impedence continues to trend up since ED visit (correlates with improving volume status). No AT/AF alerts.   Echo 6/16 EF 30-35% RV ok   Labs (5/14): K 3.7,  creatinine 1.5 Labs (12/14): K 4.2, creatinine 1.27, LDL 84, HDL 31, TSH normal Labs (3/15): K 4.6, creatinine 1.29 Labs (10/07/13) K 3.9 Creatinine 1.44 Labs (11/14/13) K 4.2, creatinine 1.48 Labs (12/05/13) K 4.8, creatinine 2.01  Labs (12/2013): K 4.3, creatinine 1.85, BUN 36, pro-BNP 884 Labs (7/15): K 3.5, creatinine 1.53 Labs (9/15): K 4, creatinine 1.9.   Labs (10/15): K 4.3, creatinine 1.7 Labs (11/15): K 4.4, creatinine 2.19 Labs (06/29/2014) K 4.0 Creatinine 1.96  Labs (08/21/2014) K 3.9 Creatinine 1.78  Past Medical History: 1. Nonischemic cardiomyopathy: Echo (3/12) with EF 30-35% and mildly dilated LV, diffuse LV hypokinesis, moderate MR, PA systolic pressure 55 mmHg.  Left and right heart cath (3/12): No angiographic CAD; mean RA 20, PA 76/41, mean PCWP 38, CI 2.1.  ANA, SPEP, and HIV negative.  TSH normal.  Denies drug abuse, heavy ETOH intake.  No family history of cardiomyopathy.  Cardiomyopathy may be due to long-standing HTN.  Echo (6/13): EF 35-40%, moderate LV dilation, moderate to severe LAE, PA systolic pressure 52 mmHg.  Unable to fit in magnet for cardiac MRI.  Echo (8/14) with EF 45%, moderately dilated LV, mild LVH, normal RV, PA systolic pressure 49 mmHg. TEE (9/15) with EF 25-30% with global hypokinesis, mild to moderate MR, mildly decreased RV systolic function. LHC/RHC (10/15) with no significant coronary disease; mean RA 14, PA 65/25 mean 42, unable to obtain PCWP but LVEDP 24, CI 2.69.   2.  ERECTILE DYSFUNCTION 3. HYPERTENSION  4. CKD 5. DYSLIPIDEMIA  6. Obesity 7. Hyperlipidemia 8. Gout 9. OSA: Severe on 6/13 sleep study.  10. Sialolithiasis 11. Atypical Atrial Flutter: TEE-DC-CV (04/03/14) which was successful;    Family History: No history of cardiomyopathy No history of cancer among first degree relatives father - died of MI (48s)  mother - HTN  Social History: Lives girlfriend.  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 tobacco - none alcohol  - none drugs - none  ROS: All systems reviewed and negative except as per HPI.   Current Outpatient Prescriptions  Medication Sig Dispense Refill  . allopurinol (ZYLOPRIM) 100 MG tablet TAKE 1 TABLET (100 MG TOTAL) BY MOUTH 2 (TWO) TIMES DAILY. 60 tablet 2  . apixaban (ELIQUIS) 5 MG TABS tablet Take 5 mg by mouth 2 (two) times daily.    . carvedilol (COREG) 25 MG tablet Take 25 mg by mouth 2 (two) times daily with a meal.    . colchicine 0.6 MG tablet Take 0.6 mg by mouth 2 (two) times daily as needed (gouty flare).    . isosorbide-hydrALAZINE (BIDIL) 20-37.5 MG per tablet Take 2 tablets by mouth 3 (three) times daily. 180 tablet 3  . KLOR-CON M20 20 MEQ tablet TAKE 1 TABLET BY MOUTH TWICE A DAY 60 tablet 3  . oxyCODONE-acetaminophen (PERCOCET) 10-325 MG tablet Take 1 tablet by mouth every 6 (six) hours as needed for pain. 90 tablet 0  . simvastatin (ZOCOR) 40 MG tablet TAKE 1 TABLET (40 MG TOTAL) BY MOUTH AT BEDTIME. 30 tablet 2  . spironolactone (ALDACTONE) 25 MG tablet TAKE 1 TABLET (25 MG TOTAL) BY MOUTH DAILY. 30 tablet 5  . torsemide (DEMADEX) 20 MG tablet Take 40-60 mg by mouth 2 (two) times daily. Take 60 mg (3 tablets) in the morning and 40 mg (2 tablets) in the afternoon     No current facility-administered medications for this encounter.    Filed Vitals:   08/04/15 0910  BP: 128/86  Pulse: 104  Weight: 329 lb 9.6 oz (149.506 kg)  SpO2: 95%   General: Well appearing, NAD.  Neck:  Thick, JVP hard to see, but does not appear elevated. No thyromegaly or thyroid nodule. Multiple skin tags present.  Lungs: CTAB, normal effort. CV: Nondisplaced PMI.  Heart irregular S1/S2, no murmur.  No carotid bruit.   Abdomen: Obese, soft, NT, ND, no HSM. No bruits or masses. +BS  Neurologic: Alert and oriented x 3.  Psych: Normal affect. Extremities: No clubbing or cyanosis. 1+ edema bilateral LEs   Assessment/Plan:  1. Chronic Systolic heart failure: Nonischemic cardiomyopathy, likely  related to HTN initially.  Echo in 8/14 with EF 45% but EF down to 25-30% on TEE while in atrial flutter (03/2014).  ECHO (1/16) with EF ~25%.  - Work up thus far -> No cardiac MRI done with elevated creatinine and weight. Concern for amyloid. No M spike. Fat pad biopsy negative. ?low voltage due to obesity. Would not proceed with endomyocardial biopsy at this time.  - s/p St Jude - Corvue device placement 04/2015. Next remote check 08/10/15, Next in person check 08/31/15.  - Stable, NYHA II-III. Central volume status stable by exam and Corvue. Does have mild peripheral edema.   - Continue torsemide 60/40 daily. OK to take 60 BID as needed for weight gain of 3 lbs overnight of 5 lbs in one week. - Continue Coreg 25 mg BID, spironolactone 25 mg daily and Bidil 2 tabs TID.   -  If HR remains elevated, consider Corlanor at next visit. - Hesitant to start ACE/ARB/ARNI with elevated creatinine. Will check BMET and schedule 2 week follow up with Pharmacist for med titration. 6 week follow up with Dr. Shirlee Latch.  - Reinforced the need and importance of daily weights, a low sodium diet, and fluid restriction (less than 2 L a day).  - Discussed the use of sliding scale diuretics if weight increases more than 3 lbs overnight or 5 lbs in a week. Knows to call HF clinic with worsening symptoms.  - Last Echo 12/2014. Will attempt med optimization and aim for repeat March/April 2017. 2. Low voltage on ECG - SPEP and fat pad biopsy negative. See above. - Consensus thus far is this is likely due to Obesity.  - Recheck ECG at next visit.  3. AKI on CKD III:  - Noted on ED labs.  Will check BMET today with increased in torsemide.  - Will follow closely with med adjustments.  4. HTN:  - Relatively stable on current regimen.  There is room to add medication, but with recent AKI follow plan as above. 5. OSA:  - Not wearing regularly. Needs to consistently wear his CPAP.   6. Morbid Obesity:  - Have encouraged to try and  walk at least 10 minutes daily (even around house during cold weather). Encouraged portion control and low-carb diet.   7. Atrial flutter: - No AT/AF alerts on corvue today.  - Atypical atrial flutter, paroxysmal in early September.  Remains in NSR s/p DCCV.  CHADSVASC = 2 (HTN, CHF).  - Continue eliquis 5 mg BID. Denies bleeding problems.  8. Gout:  - Per PCP. Sees Dr Lajoyce Corners.    As above, no med changes with recent AKI. F/u 2 weeks with Pharm for med titration with plans to add ARB/ARNI. F/u 6 weeks from today with Dr. Shirlee Latch.   I reviewed historical documentation including recent ED visit, labs, and most recent echo to aid in the decision making process of this visit.   Graciella Freer, PA-C 9:11 AM

## 2015-08-03 NOTE — Telephone Encounter (Signed)
Patient called to see if he could pick up his pain medications.

## 2015-08-03 NOTE — Telephone Encounter (Signed)
.  Next ICM remote transmission on 08/10/2015.

## 2015-08-03 NOTE — Telephone Encounter (Signed)
Called pt again, informed script ready

## 2015-08-04 ENCOUNTER — Ambulatory Visit (HOSPITAL_COMMUNITY)
Admission: RE | Admit: 2015-08-04 | Discharge: 2015-08-04 | Disposition: A | Discharge status: Home / Self Care | Payer: Medicaid Other | Source: Ambulatory Visit | Attending: Internal Medicine | Admitting: Internal Medicine

## 2015-08-04 VITALS — BP 128/86 | HR 104 | Wt 329.6 lb

## 2015-08-04 DIAGNOSIS — E785 Hyperlipidemia, unspecified: Secondary | ICD-10-CM | POA: Insufficient documentation

## 2015-08-04 DIAGNOSIS — I130 Hypertensive heart and chronic kidney disease with heart failure and stage 1 through stage 4 chronic kidney disease, or unspecified chronic kidney disease: Secondary | ICD-10-CM | POA: Insufficient documentation

## 2015-08-04 DIAGNOSIS — I4892 Unspecified atrial flutter: Secondary | ICD-10-CM | POA: Diagnosis not present

## 2015-08-04 DIAGNOSIS — N183 Chronic kidney disease, stage 3 unspecified: Secondary | ICD-10-CM

## 2015-08-04 DIAGNOSIS — M109 Gout, unspecified: Secondary | ICD-10-CM | POA: Insufficient documentation

## 2015-08-04 DIAGNOSIS — Z95810 Presence of automatic (implantable) cardiac defibrillator: Secondary | ICD-10-CM | POA: Diagnosis not present

## 2015-08-04 DIAGNOSIS — Z8249 Family history of ischemic heart disease and other diseases of the circulatory system: Secondary | ICD-10-CM | POA: Insufficient documentation

## 2015-08-04 DIAGNOSIS — I428 Other cardiomyopathies: Secondary | ICD-10-CM | POA: Insufficient documentation

## 2015-08-04 DIAGNOSIS — Z7902 Long term (current) use of antithrombotics/antiplatelets: Secondary | ICD-10-CM | POA: Insufficient documentation

## 2015-08-04 DIAGNOSIS — G4733 Obstructive sleep apnea (adult) (pediatric): Secondary | ICD-10-CM | POA: Insufficient documentation

## 2015-08-04 DIAGNOSIS — N179 Acute kidney failure, unspecified: Secondary | ICD-10-CM | POA: Insufficient documentation

## 2015-08-04 DIAGNOSIS — I5022 Chronic systolic (congestive) heart failure: Secondary | ICD-10-CM | POA: Diagnosis not present

## 2015-08-04 DIAGNOSIS — Z79899 Other long term (current) drug therapy: Secondary | ICD-10-CM | POA: Insufficient documentation

## 2015-08-04 LAB — BASIC METABOLIC PANEL
Anion gap: 8 (ref 5–15)
BUN: 21 mg/dL — AB (ref 6–20)
CHLORIDE: 104 mmol/L (ref 101–111)
CO2: 31 mmol/L (ref 22–32)
CREATININE: 1.66 mg/dL — AB (ref 0.61–1.24)
Calcium: 9.1 mg/dL (ref 8.9–10.3)
GFR calc Af Amer: 52 mL/min — ABNORMAL LOW (ref >60)
GFR calc non Af Amer: 45 mL/min — ABNORMAL LOW (ref >60)
GLUCOSE: 110 mg/dL — AB (ref 65–99)
Potassium: 4.2 mmol/L (ref 3.5–5.1)
SODIUM: 143 mmol/L (ref 135–145)

## 2015-08-04 LAB — BRAIN NATRIURETIC PEPTIDE: B NATRIURETIC PEPTIDE 5: 172.6 pg/mL — AB (ref 0.0–100.0)

## 2015-08-04 MED ORDER — TORSEMIDE 20 MG PO TABS: 40.0000 mg | ORAL_TABLET | Freq: Two times a day (BID) | ORAL | Status: DC

## 2015-08-04 NOTE — Patient Instructions (Signed)
CONTINUE Torsemide 60 mg (3 tabs) in the AM and 40 mg (2 tabs ) in the PM, you may take additional 20 mg in the PM for 3 lb weight gain overnight or 5 lb weight gain in a week  Labs today  Your physician recommends that you schedule a follow-up appointment in: 2 weeks for medication titration with Cicero Duck, PharmD  Your physician recommends that you schedule a follow-up appointment in: 6 week with Dr. Shirlee Latch  \Do the following things EVERYDAY: 1) Weigh yourself in the morning before breakfast. Write it down and keep it in a log. 2) Take your medicines as prescribed 3) Eat low salt foods-Limit salt (sodium) to 2000 mg per day.  4) Stay as active as you can everyday Limit all fluids for the day to less than 2 liters

## 2015-08-04 NOTE — Progress Notes (Signed)
Advanced Heart Failure Medication Review by a Pharmacist  Does the patient  feel that his/her medications are working for him/her?  yes  Has the patient been experiencing any side effects to the medications prescribed?  no  Does the patient measure his/her own blood pressure or blood glucose at home?  no   Does the patient have any problems obtaining medications due to transportation or finances?   no  Understanding of regimen: good Understanding of indications: good Potential of compliance: good Patient understands to avoid NSAIDs. Patient understands to avoid decongestants.  Issues to address at subsequent visits: None   Pharmacist comments: Eric Murillo is a pleasant 55 yo M presenting without a medication list but with good recall of his regimen including dosages. He does admit to missing a few days of his medications 2/2 being out of refills but since last week has been taking them consistently. He did not have any specific medication-related questions or concerns for me at this time.   Tyler Deis. Bonnye Fava, PharmD, BCPS, CPP Clinical Pharmacist Pager: [phone] Phone: [phone] 08/04/2015 9:16 AM      Time with patient: 8 minutes Preparation and documentation time: 2 minutes Total time: 10 minutes

## 2015-08-10 ENCOUNTER — Ambulatory Visit (INDEPENDENT_AMBULATORY_CARE_PROVIDER_SITE_OTHER): Payer: Medicaid Other

## 2015-08-10 DIAGNOSIS — I5022 Chronic systolic (congestive) heart failure: Secondary | ICD-10-CM | POA: Diagnosis not present

## 2015-08-10 DIAGNOSIS — Z95810 Presence of automatic (implantable) cardiac defibrillator: Secondary | ICD-10-CM | POA: Diagnosis not present

## 2015-08-10 NOTE — Progress Notes (Signed)
EPIC Encounter for ICM Monitoring  Patient Name: Eric Murillo is a 55 y.o. male Date: 08/10/2015 Primary Care Physican: Darreld Mclean, MD Primary Cardiologist: Shirlee Latch Electrophysiologist: Graciela Husbands Dry Weight: 227 lb       In the past month, have you:  1. Gained more than 2 pounds in a day or more than 5 pounds in a week? no  2. Had changes in your medications (with verification of current medications)? no  3. Had more shortness of breath than is usual for you? no  4. Limited your activity because of shortness of breath? no  5. Not been able to sleep because of shortness of breath? no  6. Had increased swelling in your feet or ankles? no  7. Had symptoms of dehydration (dizziness, dry mouth, increased thirst, decreased urine output) no  8. Had changes in sodium restriction? no  9. Been compliant with medication? Yes   ICM trend: 3 month view 08/10/2015  ICM trend: 1 year view 08/10/2015   Follow-up plan: ICM clinic phone appointment 10/04/2015 and appointment with Dr Graciela Husbands on 08/31/2015.  CORVUE:  Daily thoracic impedance trending slightly above baseline.  Patient denied any HF symptoms at this time.    Discussed following 2000 mg low salt diet and limit fluid intake to 64 oz daily.  Had 10 lb weight loss since 07/28/2015.   He stated he is feeling well at this time.   He stated he has a good understanding of the medicine changes from the appointment with Dr Shirlee Latch on 08/04/2015.    Reviewed his upcoming appointment dates.   No changes today.   Copy of note sent to patient's primary care physician, primary cardiologist, and device following physician.  Karie Soda, RN, CCM 08/10/2015 11:53 AM

## 2015-08-18 ENCOUNTER — Ambulatory Visit (HOSPITAL_COMMUNITY): Payer: Medicaid Other

## 2015-08-23 ENCOUNTER — Ambulatory Visit (HOSPITAL_COMMUNITY)
Admission: RE | Admit: 2015-08-23 | Discharge: 2015-08-23 | Disposition: A | Discharge status: Home / Self Care | Payer: Medicaid Other | Source: Ambulatory Visit | Attending: Cardiology | Admitting: Cardiology

## 2015-08-23 VITALS — BP 130/90 | HR 87 | Wt 332.6 lb

## 2015-08-23 DIAGNOSIS — I4891 Unspecified atrial fibrillation: Secondary | ICD-10-CM | POA: Insufficient documentation

## 2015-08-23 DIAGNOSIS — E785 Hyperlipidemia, unspecified: Secondary | ICD-10-CM | POA: Diagnosis not present

## 2015-08-23 DIAGNOSIS — I428 Other cardiomyopathies: Secondary | ICD-10-CM | POA: Insufficient documentation

## 2015-08-23 DIAGNOSIS — I5043 Acute on chronic combined systolic (congestive) and diastolic (congestive) heart failure: Secondary | ICD-10-CM

## 2015-08-23 DIAGNOSIS — Z79899 Other long term (current) drug therapy: Secondary | ICD-10-CM | POA: Diagnosis not present

## 2015-08-23 DIAGNOSIS — I110 Hypertensive heart disease with heart failure: Secondary | ICD-10-CM | POA: Insufficient documentation

## 2015-08-23 DIAGNOSIS — I5022 Chronic systolic (congestive) heart failure: Secondary | ICD-10-CM | POA: Diagnosis present

## 2015-08-23 DIAGNOSIS — G4733 Obstructive sleep apnea (adult) (pediatric): Secondary | ICD-10-CM | POA: Insufficient documentation

## 2015-08-23 DIAGNOSIS — I4892 Unspecified atrial flutter: Secondary | ICD-10-CM

## 2015-08-23 MED ORDER — METOLAZONE 2.5 MG PO TABS: 2.5000 mg | ORAL_TABLET | Freq: Once | ORAL | Status: DC

## 2015-08-23 NOTE — Patient Instructions (Signed)
It was great to see you today!  Please take metolazone 2.5 mg once today and continue your torsemide 60 mg (3 tablets) in the morning and 40 mg (2 tablets) in the afternoon.   You are scheduled to see Tonye Becket on Tuesday February 14th at 10:40 am.

## 2015-08-23 NOTE — Progress Notes (Signed)
HPI:  Eric Murillo is a 55 yo AA male with history of morbid obesity, hyperlipidemia, HTN, atrial flutter (s/p TEE DC-CV 04/03/14), NICM, chronic systolic HF and severe OSA but unable to tolerate CPAP.   At his last HF clinic appointment on 08/04/2015, no medication changes were made with recent admission in December for HF exacerbation with AKI.   Marland Kitchen Shortness of breath/dyspnea on exertion? Yes - can walk about 100 yards before getting out of breath (stable) . Orthopnea/PND? Yes - uses 2 pillows at night (stable) . Edema? Yes - 1-2+ pitting edema in bilateral lower extremities . Lightheadedness/dizziness? No . Daily weights at home? No - has scale but forgets to weigh  . Blood pressure/heart rate monitoring at home? no . Following low-sodium/fluid-restricted diet? No - has been trying to restrict fluid and sodium intake lately but having a hard time with this  HF Medications: Carvedilol 25 mg PO BID - at goal Bidil (20-37.5 mg) 2 tablets PO TID - at goal Spironolactone 25 mg PO daily Torsemide 60 mg PO QAM and 40 mg PO QPM - took additional 40 mg daily for 2 days two weeks ago  Electrolytes: KCl 20 mEq BID   Has the patient been experiencing any side effects to the medications prescribed?  no  Does the patient have any problems obtaining medications due to transportation or finances?   No - Medicaid  Understanding of regimen: good Understanding of indications: good Potential of compliance: good Patient understands to avoid NSAIDs. Patient understands to avoid decongestants.    Pertinent Lab Values: . Serum creatinine 1.66 (08/04/15; Baseline ~1.5, CrCl ~100 ml/min), CO2 31 (08/04/15), Potassium 4.2 (08/04/15), Sodium 143 (08/04/15), BNP 172 (08/04/15)  Vital Signs: . Weight: 332.6 lb UP 3 lbs from last visit (dry weight: 329 lb) . Blood pressure: 130/90 mmHg  . Heart rate: 87 bpm  Heart Failure Assessment & Plan: . Ejection fraction: 30-35% (01/05/15) . NYHA symptom class:  II-III . Based on clinical presentation, vital signs, and recent labs, will recommend to take metolazone 2.5 mg once today and see back with Amy Clegg in 2 weeks  Summary: 1. Chronic systolic heart failure 2. Hypertension 3. Dyslipidemia 4. Atrial fibrillation  During today's visit, all of Eric Murillo HF medications were reviewed with him at length and he verbalized understanding of the importance of consistent utilization. He reports good compliance with his regimen although he did not take his morning doses of medications because he was coming to clinic. I did recommend that in the future he take his morning doses before coming to clinic. The importance of daily home weights as well as a low sodium/fluid diet was re-emphasized with Eric Murillo and he verbalized understanding. Patient was seen with Tonye Becket, NP and with increasing weight, edema and elevated JVP have recommended that he take metolazone 2.5 mg PO once today and continue his current diuretic regimen of torsemide 60 mg QAM and 40 mg QPM. He is currently on goal doses of carvedilol, Bidil and spironolactone. If SCr stable at next clinic visit, consider addition of low dose Entresto which will better control his blood pressure and aid in additional diuresis in addition to its proven morbidity/mortality benefits in patients with chronic systolic heart failure.   1) Medication changes: Metolazone 2.5 mg PO x 1 tonight  2) Labs: next visit 3) Follow-up: 2 weeks with Tonye Becket, NP-C   Eric Murillo, PharmD, BCPS, CPP Clinical Pharmacist Pager: [phone] Phone: [phone] 08/23/2015 1:20 PM

## 2015-08-30 ENCOUNTER — Other Ambulatory Visit: Payer: Self-pay | Admitting: Internal Medicine

## 2015-08-30 DIAGNOSIS — M25572 Pain in left ankle and joints of left foot: Secondary | ICD-10-CM

## 2015-08-30 NOTE — Telephone Encounter (Signed)
Patient needs pain medication refilled. 

## 2015-08-30 NOTE — Telephone Encounter (Signed)
Last filled 1/6 Next appt 12/2015 Last uds not recently, >2 yrs

## 2015-08-31 ENCOUNTER — Encounter: Payer: Self-pay | Admitting: Internal Medicine

## 2015-08-31 ENCOUNTER — Ambulatory Visit (INDEPENDENT_AMBULATORY_CARE_PROVIDER_SITE_OTHER): Payer: Medicaid Other | Admitting: Internal Medicine

## 2015-08-31 VITALS — BP 94/70 | HR 88 | Ht 69.5 in | Wt 322.0 lb

## 2015-08-31 DIAGNOSIS — I429 Cardiomyopathy, unspecified: Secondary | ICD-10-CM | POA: Diagnosis not present

## 2015-08-31 DIAGNOSIS — Z95810 Presence of automatic (implantable) cardiac defibrillator: Secondary | ICD-10-CM | POA: Diagnosis not present

## 2015-08-31 DIAGNOSIS — I5022 Chronic systolic (congestive) heart failure: Secondary | ICD-10-CM

## 2015-08-31 DIAGNOSIS — I428 Other cardiomyopathies: Secondary | ICD-10-CM

## 2015-08-31 LAB — CUP PACEART INCLINIC DEVICE CHECK
Battery Remaining Longevity: 97.2
Date Time Interrogation Session: 20170207163140
HighPow Impedance: 74.25 Ohm
Implantable Lead Implant Date: 20161028
Implantable Lead Location: 753860
Implantable Lead Model: 181
Implantable Lead Serial Number: 332395
Lead Channel Impedance Value: 400 Ohm
Lead Channel Pacing Threshold Amplitude: 0.75 V
Lead Channel Pacing Threshold Pulse Width: 0.5 ms
Lead Channel Setting Sensing Sensitivity: 0.5 mV
MDC IDC MSMT LEADCHNL RV PACING THRESHOLD AMPLITUDE: 0.75 V
MDC IDC MSMT LEADCHNL RV PACING THRESHOLD PULSEWIDTH: 0.5 ms
MDC IDC MSMT LEADCHNL RV SENSING INTR AMPL: 12 mV
MDC IDC PG SERIAL: 7305918
MDC IDC SET LEADCHNL RV PACING AMPLITUDE: 2.5 V
MDC IDC SET LEADCHNL RV PACING PULSEWIDTH: 0.5 ms
MDC IDC STAT BRADY RV PERCENT PACED: 0.06 %

## 2015-08-31 MED ORDER — OXYCODONE-ACETAMINOPHEN 10-325 MG PO TABS: 1.0000 | ORAL_TABLET | Freq: Four times a day (QID) | ORAL | Status: DC | PRN

## 2015-08-31 NOTE — Telephone Encounter (Signed)
Attempted to call twice, busy both times

## 2015-08-31 NOTE — Patient Instructions (Signed)
Medication Instructions: - Your physician recommends that you continue on your current medications as directed. Please refer to the Current Medication list given to you today.  Labwork: - none  Procedures/Testing: - none  Follow-Up: - Remote monitoring is used to monitor your Pacemaker of ICD from home. This monitoring reduces the number of office visits required to check your device to one time per year. It allows us to keep an eye on the functioning of your device to ensure it is working properly. You are scheduled for a device check from home on 11/30/15. You may send your transmission at any time that day. If you have a wireless device, the transmission will be sent automatically. After your physician reviews your transmission, you will receive a postcard with your next transmission date.  - Your physician wants you to follow-up in: 9 months with Dr. Klein. You will receive a reminder letter in the mail two months in advance. If you don't receive a letter, please call our office to schedule the follow-up appointment.  Any Additional Special Instructions Will Be Listed Below (If Applicable).     If you need a refill on your cardiac medications before your next appointment, please call your pharmacy.   

## 2015-08-31 NOTE — Progress Notes (Signed)
Patient Care Team: Darreld Mclean, MD as PCP - General   HPI  Eric Murillo is a 55 y.o. male Seen in follow-up for ICD implanted 10/16 for primary prevention in the setting of nonischemic cardiomyopathy He has had a catheterization demonstrating no obstructive coronary disease. 8/14. He then developed atrial flutter 9/15 ejection fraction at that time was 25%. He underwent cardioversion at that time. He's been treated with apixaban.  ECG was remarkable for extremely low voltage. He was to undergo fat pad biopsy. He underwent SPEP which showed no monoclonal spike.   Ejection fraction remains suppressed with echocardiogram 1/16 of 25-30%. Repeat echo is pending   Most recent laboratories included a creatinine of 1.78>>1.56 And potassium level was normal. This was on Aldactone    Past Medical History  Diagnosis Date  . Hypertension   . Hyperlipidemia   . Chronic systolic congestive heart failure, NYHA class 2 (HCC)     EF 20-25% 1/16  . Organic erectile dysfunction   . Morbid obesity with BMI of 45.0-49.9, adult (HCC)   . Gout     Of big toe  . CKD (chronic kidney disease) stage 3, GFR 30-59 ml/min   . Atrial flutter (HCC)     DCCV 9/15  . Non-ischemic cardiomyopathy (HCC)   . OSA (obstructive sleep apnea)   . Submandibular sialolithiasis     Right  . Pilonidal cyst   . Dyslipidemia   . Anemia of chronic disease   . Chest pain on exertion   . Hyperglycemia   . Arthritis     Past Surgical History  Procedure Laterality Date  . Colonoscopy    . Salivary gland surgery  09/12/2012  . Cardiac catheterization  2012  . Submandibular gland excision Right 09/12/2012    Procedure: Removal Right Submandibular Larina Bras;  Surgeon: Serena Colonel, MD;  Location: [facility] OR;  Service: ENT;  Laterality: Right;  . Tee without cardioversion N/A 04/03/2014    Procedure: TRANSESOPHAGEAL ECHOCARDIOGRAM (TEE);  Surgeon: Laurey Morale, MD;  Location: [facility] ENDOSCOPY;  Service: Cardiovascular;   Laterality: N/A;  . Cardioversion N/A 04/03/2014    Procedure: CARDIOVERSION;  Surgeon: Laurey Morale, MD;  Location: [facility] ENDOSCOPY;  Service: Cardiovascular;  Laterality: N/A;  . Left and right heart catheterization with coronary angiogram N/A 04/24/2014    Procedure: LEFT AND RIGHT HEART CATHETERIZATION WITH CORONARY ANGIOGRAM;  Surgeon: Laurey Morale, MD;  Location: [facility] CATH LAB;  Service: Cardiovascular;  Laterality: N/A;  . Ep implantable device N/A 05/21/2015    Procedure: ICD Implant;  Surgeon: Duke Salvia, MD;  Location: [facility] INVASIVE CV LAB;  Service: Cardiovascular;  Laterality: N/A;    Current Outpatient Prescriptions  Medication Sig Dispense Refill  . allopurinol (ZYLOPRIM) 100 MG tablet TAKE 1 TABLET (100 MG TOTAL) BY MOUTH 2 (TWO) TIMES DAILY. 60 tablet 2  . apixaban (ELIQUIS) 5 MG TABS tablet Take 5 mg by mouth 2 (two) times daily.    . carvedilol (COREG) 25 MG tablet Take 25 mg by mouth 2 (two) times daily with a meal.    . colchicine 0.6 MG tablet Take 0.6 mg by mouth 2 (two) times daily as needed (gouty flare).    . isosorbide-hydrALAZINE (BIDIL) 20-37.5 MG per tablet Take 2 tablets by mouth 3 (three) times daily. 180 tablet 3  . KLOR-CON M20 20 MEQ tablet TAKE 1 TABLET BY MOUTH TWICE A DAY 60 tablet 3  . metolazone (ZAROXOLYN) 2.5 MG tablet Take 1 tablet (  2.5 mg total) by mouth once. 15 tablet 3  . oxyCODONE-acetaminophen (PERCOCET) 10-325 MG tablet Take 1 tablet by mouth every 6 (six) hours as needed for pain. 90 tablet 0  . simvastatin (ZOCOR) 40 MG tablet TAKE 1 TABLET (40 MG TOTAL) BY MOUTH AT BEDTIME. 30 tablet 2  . spironolactone (ALDACTONE) 25 MG tablet TAKE 1 TABLET (25 MG TOTAL) BY MOUTH DAILY. 30 tablet 5  . torsemide (DEMADEX) 20 MG tablet Take 2-3 tablets (40-60 mg total) by mouth 2 (two) times daily. Take 60 mg (3 tablets) in the morning and 40 mg (2 tablets) in the afternoon, may take additional 20 mg in the PM for weight gain 3lbs overnight and 5 lbs in a week  90 tablet 2   No current facility-administered medications for this visit.    Allergies  Allergen Reactions  . Beta Adrenergic Blockers Other (See Comments)    REACTION: A white colored Beta Blocker made him feel funny.      Review of Systems negative except from HPI and PMH  Physical Exam BP 94/70 mmHg  Pulse 88  Ht 5' 9.5" (1.765 m)  Wt 322 lb (146.058 kg)  BMI 46.89 kg/m2 Well developed and well nourished in no acute distress HENT normal E scleral and icterus clear Neck Supple JVP flat; carotids brisk and full Clear to ausculation  Device pocket well healed; without hematoma or erythema.  There is no tethering Regular rate and rhythm, no murmurs gallops or rub Soft with active bowel sounds No clubbing cyanosis 2+ Edema Alert and oriented, grossly normal motor and sensory function Skin Warm and Dry  Sinus rhythm at 94 Intervals 25/11/39 Axis CCLXX  Assessment and  Plan  Nonischemic cardiomyopathy-probably infiltrative with low voltage and an unusual axis and conduction system disease  Congestive heart failure-chronic-class III  Chronic kidney disease class III  Implantable defibrillator-[employer]  Relative sinus tachycardia    The patient continues to struggle with weight and heart failure symptoms. His blood pressure is low precluding further up titration of afterload reduction. I wonder whether he may not be a candidate for Ivabradine. I'll discuss this with Dr. DM.  Device function is normal.  The mechanism of his cardiomyopathy remains elusive. His low voltage unusual QRS complexes suggestive infiltrative process. I wonder if there is any role in biopsy. I will defer this to Dr. DM as well.

## 2015-08-31 NOTE — Telephone Encounter (Signed)
Pt informed

## 2015-09-02 NOTE — Progress Notes (Signed)
Yes, ivabradine would be a good idea for him.  I do not think we could MRI him with his size (for infiltrative disease) but would at least consider fat pad biopsy.  I will try to get him back in with me.   Heather/Megan, can you arrange appointment with me?

## 2015-09-07 ENCOUNTER — Ambulatory Visit (HOSPITAL_COMMUNITY)
Admission: RE | Admit: 2015-09-07 | Discharge: 2015-09-07 | Disposition: A | Discharge status: Home / Self Care | Payer: Medicaid Other | Source: Ambulatory Visit | Attending: Adult Health | Admitting: Adult Health

## 2015-09-07 ENCOUNTER — Encounter (HOSPITAL_COMMUNITY): Payer: Self-pay

## 2015-09-07 VITALS — BP 118/84 | HR 106 | Resp 20 | Wt 332.5 lb

## 2015-09-07 DIAGNOSIS — N183 Chronic kidney disease, stage 3 unspecified: Secondary | ICD-10-CM

## 2015-09-07 DIAGNOSIS — I4892 Unspecified atrial flutter: Secondary | ICD-10-CM | POA: Insufficient documentation

## 2015-09-07 DIAGNOSIS — E785 Hyperlipidemia, unspecified: Secondary | ICD-10-CM | POA: Diagnosis not present

## 2015-09-07 DIAGNOSIS — I110 Hypertensive heart disease with heart failure: Secondary | ICD-10-CM | POA: Diagnosis not present

## 2015-09-07 DIAGNOSIS — I428 Other cardiomyopathies: Secondary | ICD-10-CM | POA: Diagnosis not present

## 2015-09-07 DIAGNOSIS — M109 Gout, unspecified: Secondary | ICD-10-CM | POA: Diagnosis not present

## 2015-09-07 DIAGNOSIS — Z6841 Body Mass Index (BMI) 40.0 and over, adult: Secondary | ICD-10-CM

## 2015-09-07 DIAGNOSIS — G4733 Obstructive sleep apnea (adult) (pediatric): Secondary | ICD-10-CM | POA: Diagnosis not present

## 2015-09-07 DIAGNOSIS — Z8249 Family history of ischemic heart disease and other diseases of the circulatory system: Secondary | ICD-10-CM | POA: Insufficient documentation

## 2015-09-07 DIAGNOSIS — I5022 Chronic systolic (congestive) heart failure: Secondary | ICD-10-CM

## 2015-09-07 DIAGNOSIS — Z7902 Long term (current) use of antithrombotics/antiplatelets: Secondary | ICD-10-CM | POA: Diagnosis not present

## 2015-09-07 DIAGNOSIS — Z79899 Other long term (current) drug therapy: Secondary | ICD-10-CM | POA: Insufficient documentation

## 2015-09-07 DIAGNOSIS — I130 Hypertensive heart and chronic kidney disease with heart failure and stage 1 through stage 4 chronic kidney disease, or unspecified chronic kidney disease: Secondary | ICD-10-CM | POA: Insufficient documentation

## 2015-09-07 NOTE — Patient Instructions (Signed)
Take torsemide 60mg  (3 tabs) twice daily today and tomorrow, then resume as normal.  Follow up 4 weeks.  Do the following things EVERYDAY: 1) Weigh yourself in the morning before breakfast. Write it down and keep it in a log. 2) Take your medicines as prescribed 3) Eat low salt foods-Limit salt (sodium) to 2000 mg per day.  4) Stay as active as you can everyday 5) Limit all fluids for the day to less than 2 liters

## 2015-09-07 NOTE — Progress Notes (Signed)
Patient ID: Eric Murillo, male   DOB: 09-27-1960, 55 y.o.   MRN: [id number]    Advanced Heart Failure Clinic Note  PCP: Dr. Glenard Haring HF: Mclean  55 yo with history of morbid obesity, hyperlipidemia, HTN, atrial flutter (s/p TEE DC-CV 04/03/14), NICM, chronic systolic HF and severe OSA but unable to tolerate CPAP.     He was admitted in 04/2014 with increased exertional dyspnea and chest tightness.  RHC/LHC showed elevated filling pressures, preserved cardiac output, and no significant coronary disease.  He was diuresed and discharged home.  Weight is up 4 lbs.  TEE (9/15) with EF 25-30% with global hypokinesis, mild to moderate MR, mildly decreased RV systolic function.   Has been undergoing work-up for possible amyloid. SPEP negative. Fat pad biopsy 6/16 with benign adipose tissue. Unable to get cMRI due to size.  Was seen in ED 07/24/15 with SOB and chest tightness. Found to be in CHF in the setting of fluid/dietary non compliance and told to increase torsemide x 3 days and follow up with CHF clinic.   He returns today for HF follow up . Denies SOB/PND/Orthopnea. Weight at home 322 pounds. Using CPAP intermittently says he is not sure if its working correctly. Large appetite. He is not exercising. Taking all medications.   Corvue  08/2015 ok.   Echo 6/16 EF 30-35% RV ok   Labs (5/14): K 3.7, creatinine 1.5 Labs (12/14): K 4.2, creatinine 1.27, LDL 84, HDL 31, TSH normal Labs (3/15): K 4.6, creatinine 1.29 Labs (10/07/13) K 3.9 Creatinine 1.44 Labs (11/14/13) K 4.2, creatinine 1.48 Labs (12/05/13) K 4.8, creatinine 2.01  Labs (12/2013): K 4.3, creatinine 1.85, BUN 36, pro-BNP 884 Labs (7/15): K 3.5, creatinine 1.53 Labs (9/15): K 4, creatinine 1.9.   Labs (10/15): K 4.3, creatinine 1.7 Labs (11/15): K 4.4, creatinine 2.19 Labs (06/29/2014) K 4.0 Creatinine 1.96  Labs (08/21/2014) K 3.9 Creatinine 1.78 Labs (08/04/2015) : K 4.2 Creatinine 1.66   Past Medical History: 1. Nonischemic  cardiomyopathy: Echo (3/12) with EF 30-35% and mildly dilated LV, diffuse LV hypokinesis, moderate MR, PA systolic pressure 55 mmHg.  Left and right heart cath (3/12): No angiographic CAD; mean RA 20, PA 76/41, mean PCWP 38, CI 2.1.  ANA, SPEP, and HIV negative.  TSH normal.  Denies drug abuse, heavy ETOH intake.  No family history of cardiomyopathy.  Cardiomyopathy may be due to long-standing HTN.  Echo (6/13): EF 35-40%, moderate LV dilation, moderate to severe LAE, PA systolic pressure 52 mmHg.  Unable to fit in magnet for cardiac MRI.  Echo (8/14) with EF 45%, moderately dilated LV, mild LVH, normal RV, PA systolic pressure 49 mmHg. TEE (9/15) with EF 25-30% with global hypokinesis, mild to moderate MR, mildly decreased RV systolic function. LHC/RHC (10/15) with no significant coronary disease; mean RA 14, PA 65/25 mean 42, unable to obtain PCWP but LVEDP 24, CI 2.69.   2. ERECTILE DYSFUNCTION 3. HYPERTENSION  4. CKD 5. DYSLIPIDEMIA  6. Obesity 7. Hyperlipidemia 8. Gout 9. OSA: Severe on 6/13 sleep study.  10. Sialolithiasis 11. Atypical Atrial Flutter: TEE-DC-CV (04/03/14) which was successful;    Family History: No history of cardiomyopathy No history of cancer among first degree relatives father - died of MI (1s)  mother - HTN  Social History: Lives girlfriend.  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 tobacco - none alcohol - none drugs - none  ROS: All systems reviewed and negative except as per HPI.   Current Outpatient Prescriptions  Medication Sig Dispense Refill  . allopurinol (ZYLOPRIM) 100 MG tablet TAKE 1 TABLET (100 MG TOTAL) BY MOUTH 2 (TWO) TIMES DAILY. 60 tablet 2  . apixaban (ELIQUIS) 5 MG TABS tablet Take 5 mg by mouth 2 (two) times daily.    . carvedilol (COREG) 25 MG tablet Take 25 mg by mouth 2 (two) times daily with a meal.    . colchicine 0.6 MG tablet Take 0.6 mg by mouth 2 (two) times daily as needed (gouty flare).    . isosorbide-hydrALAZINE  (BIDIL) 20-37.5 MG per tablet Take 2 tablets by mouth 3 (three) times daily. 180 tablet 3  . KLOR-CON M20 20 MEQ tablet TAKE 1 TABLET BY MOUTH TWICE A DAY 60 tablet 3  . oxyCODONE-acetaminophen (PERCOCET) 10-325 MG tablet Take 1 tablet by mouth every 6 (six) hours as needed for pain. 90 tablet 0  . simvastatin (ZOCOR) 40 MG tablet TAKE 1 TABLET (40 MG TOTAL) BY MOUTH AT BEDTIME. 30 tablet 2  . spironolactone (ALDACTONE) 25 MG tablet TAKE 1 TABLET (25 MG TOTAL) BY MOUTH DAILY. 30 tablet 5  . torsemide (DEMADEX) 20 MG tablet Take 2-3 tablets (40-60 mg total) by mouth 2 (two) times daily. Take 60 mg (3 tablets) in the morning and 40 mg (2 tablets) in the afternoon, may take additional 20 mg in the PM for weight gain 3lbs overnight and 5 lbs in a week 90 tablet 2  . metolazone (ZAROXOLYN) 2.5 MG tablet Take 1 tablet (2.5 mg total) by mouth once. (Patient not taking: Reported on 09/07/2015) 15 tablet 3   No current facility-administered medications for this encounter.    Filed Vitals:   09/07/15 1043  BP: 118/84  Pulse: 106  Resp: 20  Weight: 332 lb 8 oz (150.821 kg)  SpO2: 98%   General: Well appearing, NAD.  Neck:  Thick, JVP hard to see, but does not appear elevated. No thyromegaly or thyroid nodule. Multiple skin tags present.  Lungs: CTAB, normal effort. CV: Nondisplaced PMI.  Heart regular S1/S2, no murmur.  No carotid bruit.   Abdomen: Obese, soft, NT, ND, no HSM. No bruits or masses. +BS  Neurologic: Alert and oriented x 3.  Psych: Normal affect. Extremities: No clubbing or cyanosis. Trace-1+ bilateral LEs   EKG: Sinus Rhythm 79 bpm.   Assessment/Plan:  1. Chronic Systolic heart failure: Nonischemic cardiomyopathy, likely related to HTN initially.  Echo in 8/14 with EF 45% but EF down to 25-30% on TEE while in atrial flutter (03/2014).  ECHO (1/16) with EF ~25%.  - Work up thus far -> No cardiac MRI done with elevated creatinine and weight. Concern for amyloid. No M spike. Fat pad  biopsy negative. ?low voltage due to obesity. Would not proceed with endomyocardial biopsy at this time.  NYHA II. Corvue ok.  Volume mildly elevated.  Increase torsemide to 60 mg twice a day for 2 days then back to 60 mg /40 mg daily. . - Continue Coreg 25 mg BID, spironolactone 25 mg daily and Bidil 2 tabs TID.   - Hesitant to start ACE/ARB/ARNI with elevated creatinine. - Reinforced the need and importance of daily weights, a low sodium diet, and fluid restriction (less than 2 L a day).   - Last Echo 12/2014. Will attempt med optimization and aim for repeat April 2017. 2. Low voltage on ECG - SPEP and fat pad biopsy negative. See above. - Consensus thus far is this is likely due to Obesity. Todays EKG NSR with low voltage.  3. CKD III: Creatinine baseline 1.4--1.7   4. HTN:  - Stable. Continue current regimen.  5. OSA:  - Not wearing regularly. Needs to consistently wear his CPAP.   6. Morbid Obesity:  -Needs to increase activity.   7. Atrial flutter: - No AT/AF alerts on corvue today.  - Atypical atrial flutter, paroxysmal in early September.  Remains in NSR s/p DCCV.  CHADSVASC = 2 (HTN, CHF).  - Continue eliquis 5 mg BID. Denies bleeding problems.  8. Gout:  Stable.     Follow up in 4 weeks. Check BMET at that time. Plan to set up ECHO at that time.   Amy Clegg, NP-C  10:50 AM

## 2015-09-15 ENCOUNTER — Encounter (HOSPITAL_COMMUNITY): Payer: Medicaid Other

## 2015-09-17 ENCOUNTER — Encounter: Payer: Self-pay | Admitting: Adult Health

## 2015-09-25 ENCOUNTER — Other Ambulatory Visit: Payer: Self-pay | Admitting: Cardiology

## 2015-09-27 ENCOUNTER — Other Ambulatory Visit: Payer: Self-pay | Admitting: Internal Medicine

## 2015-09-27 DIAGNOSIS — M25572 Pain in left ankle and joints of left foot: Secondary | ICD-10-CM

## 2015-09-27 NOTE — Telephone Encounter (Signed)
Pt requesting pain medication refill. 

## 2015-09-27 NOTE — Telephone Encounter (Signed)
Last refill 2/7 Next visit 12/2015 Last uds 12/2014

## 2015-09-29 ENCOUNTER — Telehealth: Payer: Self-pay | Admitting: Internal Medicine

## 2015-09-29 MED ORDER — OXYCODONE-ACETAMINOPHEN 10-325 MG PO TABS: 1.0000 | ORAL_TABLET | Freq: Four times a day (QID) | ORAL | Status: DC | PRN

## 2015-09-29 NOTE — Telephone Encounter (Signed)
i have called pt 3 times today and ph has been busy each time

## 2015-09-29 NOTE — Telephone Encounter (Signed)
Pt requesting oxycodone to be filled. °

## 2015-10-03 ENCOUNTER — Other Ambulatory Visit (HOSPITAL_COMMUNITY): Payer: Self-pay | Admitting: Internal Medicine

## 2015-10-04 ENCOUNTER — Ambulatory Visit (INDEPENDENT_AMBULATORY_CARE_PROVIDER_SITE_OTHER): Payer: Medicaid Other

## 2015-10-04 DIAGNOSIS — I5022 Chronic systolic (congestive) heart failure: Secondary | ICD-10-CM

## 2015-10-04 DIAGNOSIS — Z95810 Presence of automatic (implantable) cardiac defibrillator: Secondary | ICD-10-CM | POA: Diagnosis not present

## 2015-10-04 NOTE — Progress Notes (Signed)
EPIC Encounter for ICM Monitoring  Patient Name: Eric Murillo is a 55 y.o. male Date: 10/04/2015 Primary Care Physican: Darreld Mclean, MD Primary Cardiologist: Shirlee Latch Electrophysiologist: Graciela Husbands Dry Weight: 321 lbs   In the past month, have you:  1. Gained more than 2 pounds in a day or more than 5 pounds in a week? no  2. Had changes in your medications (with verification of current medications)? no  3. Had more shortness of breath than is usual for you? no  4. Limited your activity because of shortness of breath? no  5. Not been able to sleep because of shortness of breath? no  6. Had increased swelling in your feet or ankles? no  7. Had symptoms of dehydration (dizziness, dry mouth, increased thirst, decreased urine output) no  8. Had changes in sodium restriction? no  9. Been compliant with medication? Yes   ICM trend: 3 month view for 10/04/2015   ICM trend: 1 year view for 10/04/2015   Follow-up plan: ICM clinic phone appointment 11/09/2015 and appointment with Dr Shirlee Latch 10/11/2015.  Thoracic impedance below reference line from 09/03/2015 to 09/13/2015 and 09/23/2015 to 09/27/2015 suggesting fluid accumulation.  Patient had symptoms of of weight gain and some shortness of breath during the February time period.  He reported at this time he is feeling better and denied any fluid symptoms.  Reviewed to limit fluid intake to 2 liters a day and salt intake to 2000 mg daily.     Encouraged to call for any fluid symptoms.  No changes today.    Karie Soda, RN, CCM 10/04/2015 1:21 PM

## 2015-10-11 ENCOUNTER — Ambulatory Visit (HOSPITAL_COMMUNITY)
Admission: RE | Admit: 2015-10-11 | Discharge: 2015-10-11 | Disposition: A | Discharge status: Home / Self Care | Payer: Medicaid Other | Source: Ambulatory Visit | Attending: Cardiology | Admitting: Cardiology

## 2015-10-11 ENCOUNTER — Encounter (HOSPITAL_COMMUNITY): Payer: Self-pay

## 2015-10-11 VITALS — BP 130/78 | HR 91 | Wt 332.0 lb

## 2015-10-11 DIAGNOSIS — I5022 Chronic systolic (congestive) heart failure: Secondary | ICD-10-CM | POA: Diagnosis not present

## 2015-10-11 DIAGNOSIS — Z8249 Family history of ischemic heart disease and other diseases of the circulatory system: Secondary | ICD-10-CM | POA: Diagnosis not present

## 2015-10-11 DIAGNOSIS — I4892 Unspecified atrial flutter: Secondary | ICD-10-CM | POA: Diagnosis not present

## 2015-10-11 DIAGNOSIS — E785 Hyperlipidemia, unspecified: Secondary | ICD-10-CM | POA: Insufficient documentation

## 2015-10-11 DIAGNOSIS — Z7901 Long term (current) use of anticoagulants: Secondary | ICD-10-CM | POA: Insufficient documentation

## 2015-10-11 DIAGNOSIS — I428 Other cardiomyopathies: Secondary | ICD-10-CM | POA: Diagnosis not present

## 2015-10-11 DIAGNOSIS — N183 Chronic kidney disease, stage 3 unspecified: Secondary | ICD-10-CM

## 2015-10-11 DIAGNOSIS — Z79899 Other long term (current) drug therapy: Secondary | ICD-10-CM | POA: Insufficient documentation

## 2015-10-11 DIAGNOSIS — G4733 Obstructive sleep apnea (adult) (pediatric): Secondary | ICD-10-CM | POA: Insufficient documentation

## 2015-10-11 DIAGNOSIS — Z95810 Presence of automatic (implantable) cardiac defibrillator: Secondary | ICD-10-CM | POA: Diagnosis not present

## 2015-10-11 DIAGNOSIS — E669 Obesity, unspecified: Secondary | ICD-10-CM | POA: Insufficient documentation

## 2015-10-11 DIAGNOSIS — I5043 Acute on chronic combined systolic (congestive) and diastolic (congestive) heart failure: Secondary | ICD-10-CM | POA: Diagnosis not present

## 2015-10-11 DIAGNOSIS — I130 Hypertensive heart and chronic kidney disease with heart failure and stage 1 through stage 4 chronic kidney disease, or unspecified chronic kidney disease: Secondary | ICD-10-CM | POA: Insufficient documentation

## 2015-10-11 DIAGNOSIS — M109 Gout, unspecified: Secondary | ICD-10-CM | POA: Insufficient documentation

## 2015-10-11 DIAGNOSIS — Z6841 Body Mass Index (BMI) 40.0 and over, adult: Secondary | ICD-10-CM

## 2015-10-11 MED ORDER — LISINOPRIL 2.5 MG PO TABS: 2.5000 mg | ORAL_TABLET | Freq: Every day | ORAL | Status: DC

## 2015-10-11 NOTE — Patient Instructions (Signed)
Start Lisinopril 2.5 mg daily  Increase Torsemide to 60 mg Twice daily FOR 4 DAYS ONLY, then back to 60 mg in AM and 40 mg in PM  Please limit your fluid intake to 2 Liters daily  Labs in 10 days  Your physician recommends that you schedule a follow-up appointment in: 6 weeks

## 2015-10-11 NOTE — Progress Notes (Signed)
Advanced Heart Failure Medication Review by a Pharmacist  Does the patient  feel that his/her medications are working for him/her?  yes  Has the patient been experiencing any side effects to the medications prescribed?  no  Does the patient measure his/her own blood pressure or blood glucose at home?  no   Does the patient have any problems obtaining medications due to transportation or finances?   no  Understanding of regimen: excellent Understanding of indications: excellent Potential of compliance: excellent Patient understands to avoid NSAIDs. Patient understands to avoid decongestants.  Issues to address at subsequent visits: none   Pharmacist comments: Eric Murillo is a pleasant 54 yo man followed by HF clinic. He reports excellent adherence to his medication regimen. No issues with medications, however does mention feeling that he has been accumulating a little bit of fluid. Has only used extra 20 mg torsemide dose once in last 3 weeks. No changes in symptoms or side effects otherwise.       Hillery Aldo, [place].D., BCPS PGY2 Cardiology Pharmacy Resident Pager: [phone]  Time with patient: 10 min Preparation and documentation time: 5 min Total time: 15 min

## 2015-10-12 ENCOUNTER — Other Ambulatory Visit (HOSPITAL_COMMUNITY): Payer: Self-pay | Admitting: Internal Medicine

## 2015-10-12 NOTE — Progress Notes (Signed)
Patient ID: Eric Murillo, male   DOB: [birth year], 55 y.o.   MRN: [id number]    Advanced Heart Failure Clinic Note  PCP: Dr. Glenard Haring Cardiology: Dr Shirlee Latch  55 yo with history of morbid obesity, hyperlipidemia, HTN, atrial flutter (s/p TEE DC-CV 04/03/14), NICM with chronic systolic HF and severe OSA but unable to tolerate CPAP.    He was admitted in 04/2014 with increased exertional dyspnea and chest tightness.  RHC/LHC showed elevated filling pressures, preserved cardiac output, and no significant coronary disease.  He was diuresed and discharged home. TEE (9/15) with EF 25-30% with global hypokinesis, mild to moderate MR, mildly decreased RV systolic function.   He underwent work-up for cardiac amyloid. SPEP negative. Fat pad biopsy 6/16 with benign adipose tissue. Unable to get cMRI due to size.  Last echo in 6/16 showed EF 30-35%, PA systolic pressure 62 mmHg.  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    He returns for followup today.  He can walk about 100 yards then has to stop because of ankle pain (not dyspnea).  Not really getting short of breath.  No orthopnea/PND.  Frustrated because he is unable to lose weight.  No chest pain.  No palpitations or lightheadedness.  He feels like he is building up some fluid as his legs are swelling more.   Corevue showed stable thoracic impedance, no evidence for significant volume overload.   Labs (5/14): K 3.7, creatinine 1.5 Labs (12/14): K 4.2, creatinine 1.27, LDL 84, HDL 31, TSH normal Labs (3/15): K 4.6, creatinine 1.29 Labs (10/07/13) K 3.9 Creatinine 1.44 Labs (11/14/13) K 4.2, creatinine 1.48 Labs (12/05/13) K 4.8, creatinine 2.01  Labs (12/2013): K 4.3, creatinine 1.85, BUN 36, pro-BNP 884 Labs (7/15): K 3.5, creatinine 1.53 Labs (9/15): K 4, creatinine 1.9.   Labs (10/15): K 4.3, creatinine 1.7 Labs (11/15): K 4.4, creatinine 2.19 Labs (06/29/2014) K 4.0 Creatinine 1.96  Labs (08/21/2014) K 3.9 Creatinine 1.78 Labs (08/04/2015) : K 4.2 Creatinine 1.66  BNP 173  ECG (2/17): NSR, 1st degree AV block.   Past Medical History: 1. Nonischemic cardiomyopathy: Echo (3/12) with EF 30-35% and mildly dilated LV, diffuse LV hypokinesis, moderate MR, PA systolic pressure 55 mmHg.  Left and right heart cath (3/12): No angiographic CAD; mean RA 20, PA 76/41, mean PCWP 38, CI 2.1.  ANA, SPEP, and HIV negative.  TSH normal.  Denies drug abuse, heavy ETOH intake.  No family history of cardiomyopathy.  Cardiomyopathy may be due to long-standing HTN.  Echo (6/13): EF 35-40%, moderate LV dilation, moderate to severe LAE, PA systolic pressure 52 mmHg.  Unable to fit in magnet for cardiac MRI.  Echo (8/14) with EF 45%, moderately dilated LV, mild LVH, normal RV, PA systolic pressure 49 mmHg. TEE (9/15) with EF 25-30% with global hypokinesis, mild to moderate MR, mildly decreased RV systolic function. LHC/RHC (10/15) with no significant coronary disease; mean RA 14, PA 65/25 mean 42, unable to obtain PCWP but LVEDP 24, CI 2.69.  Echo (6/16) with Ef 30-35%, PA systolic pressure 62 mmHg, RV normal size and systolic function. [employer] ICD.  2. ERECTILE DYSFUNCTION 3. HYPERTENSION  4. CKD 5. DYSLIPIDEMIA  6. Obesity 7. Hyperlipidemia 8. Gout 9. OSA: Severe on 6/13 sleep study. Unable to use CPAP.  10. Sialolithiasis 11. Atypical Atrial Flutter: TEE-DC-CV (04/03/14) which was successful;    Family History: No history of cardiomyopathy No history of cancer among first degree relatives father - died of MI (27s)  mother -  HTN  Social History: Lives girlfriend.  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 tobacco - none alcohol - none drugs - none  ROS: All systems reviewed and negative except as per HPI.   Current Outpatient Prescriptions  Medication Sig Dispense Refill  . allopurinol (ZYLOPRIM) 100 MG tablet TAKE 1 TABLET (100 MG TOTAL) BY MOUTH 2 (TWO) TIMES DAILY. 60 tablet 2  . apixaban (ELIQUIS) 5 MG TABS tablet Take 5 mg by mouth 2 (two) times daily.     Marland Kitchen BIDIL 20-37.5 MG tablet TAKE 2 TABLETS BY MOUTH 3 TIMES A DAY 180 tablet 3  . carvedilol (COREG) 25 MG tablet Take 25 mg by mouth 2 (two) times daily with a meal.    . KLOR-CON M20 20 MEQ tablet TAKE 1 TABLET BY MOUTH TWICE A DAY 60 tablet 3  . simvastatin (ZOCOR) 40 MG tablet TAKE 1 TABLET (40 MG TOTAL) BY MOUTH AT BEDTIME. 30 tablet 2  . spironolactone (ALDACTONE) 25 MG tablet TAKE 1 TABLET (25 MG TOTAL) BY MOUTH DAILY. 30 tablet 5  . torsemide (DEMADEX) 20 MG tablet Take 2-3 tablets (40-60 mg total) by mouth 2 (two) times daily. Take 60 mg (3 tablets) in the morning and 40 mg (2 tablets) in the afternoon, may take additional 20 mg in the PM for weight gain 3lbs overnight and 5 lbs in a week 90 tablet 2  . colchicine 0.6 MG tablet Take 0.6 mg by mouth 2 (two) times daily as needed (gouty flare).    Marland Kitchen lisinopril (PRINIVIL,ZESTRIL) 2.5 MG tablet Take 1 tablet (2.5 mg total) by mouth daily. 30 tablet 3  . metolazone (ZAROXOLYN) 2.5 MG tablet Take 1 tablet (2.5 mg total) by mouth once. (Patient not taking: Reported on 09/07/2015) 15 tablet 3  . oxyCODONE-acetaminophen (PERCOCET) 10-325 MG tablet Take 1 tablet by mouth every 6 (six) hours as needed for pain. 90 tablet 0   No current facility-administered medications for this encounter.    Filed Vitals:   10/11/15 1354  BP: 130/78  Pulse: 91  Weight: 332 lb (150.594 kg)  SpO2: 97%   General: Well appearing, NAD.  Neck:  Thick, JVP 8 cm. No thyromegaly or thyroid nodule. Multiple skin tags present.  Lungs: CTAB, normal effort. CV: Nondisplaced PMI.  Heart regular S1/S2, no murmur.  No carotid bruit.   Abdomen: Obese, soft, NT, ND, no HSM. No bruits or masses. +BS  Neurologic: Alert and oriented x 3.  Psych: Normal affect. Extremities: No clubbing or cyanosis. 1+ edema 1/2 to knees bilaterally.   Assessment/Plan:  1. Chronic systolic heart failure: Nonischemic cardiomyopathy, likely related to HTN.  Echo in 8/14 with EF 45% but EF down  to 25-30% on TEE while in atrial flutter (03/2014).  Echo (1/16) with EF ~25% and echo in 6/16 with EF 30-35%.  No cardiac MRI done with elevated creatinine and size.  There was concern for cardiac amyloidosis.  However, negative SPEP and abdominal fat pad biopsy negative. Low voltage on ECG may be due to obesity and not amyloidosis. Would not proceed with endomyocardial biopsy at this time. NYHA II. Mild volume overload on exam though Corevue looks ok.  - Increase torsemide to 60 mg twice a day for 4 days then back to 60 mg /40 mg daily. . - Continue Coreg 25 mg BID, spironolactone 25 mg daily and Bidil 2 tabs TID.   - Start lisinopril 2.5 daily with BMET in 10 days. . - Needs to back off on  fluid intake.  2. Low voltage on ECG: SPEP and fat pad biopsy negative. See above.  Consensus thus far is this is likely due to Obesity. 3. CKD III: BMET today and repeat in 10 days.  4. HTN: Adding lisinopril as above.  5. OSA: Not wearing regularly. Needs to consistently wear his CPAP.   6. Morbid Obesity: Having trouble losing weight.  Would be reasonable to look into bariatric surgery.    7. Atrial flutter:  No AT/AF alerts on Corevue today.  Atypical atrial flutter in 9/15. Remains in NSR s/p DCCV.  CHADSVASC = 2 (HTN, CHF).  - Continue eliquis 5 mg BID. Denies bleeding problems.  8. Gout: Stable.    Followup in 6 wks.   [name] 10/12/2015

## 2015-10-22 ENCOUNTER — Ambulatory Visit (HOSPITAL_COMMUNITY)
Admission: RE | Admit: 2015-10-22 | Discharge: 2015-10-22 | Disposition: A | Discharge status: Home / Self Care | Payer: Medicaid Other | Source: Ambulatory Visit | Attending: Cardiology | Admitting: Cardiology

## 2015-10-22 DIAGNOSIS — I5043 Acute on chronic combined systolic (congestive) and diastolic (congestive) heart failure: Secondary | ICD-10-CM | POA: Insufficient documentation

## 2015-10-22 LAB — BASIC METABOLIC PANEL
ANION GAP: 10 (ref 5–15)
BUN: 16 mg/dL (ref 6–20)
CALCIUM: 9.2 mg/dL (ref 8.9–10.3)
CO2: 29 mmol/L (ref 22–32)
Chloride: 102 mmol/L (ref 101–111)
Creatinine, Ser: 1.84 mg/dL — ABNORMAL HIGH (ref 0.61–1.24)
GFR, EST AFRICAN AMERICAN: 46 mL/min — AB (ref >60)
GFR, EST NON AFRICAN AMERICAN: 40 mL/min — AB (ref >60)
GLUCOSE: 112 mg/dL — AB (ref 65–99)
POTASSIUM: 4.2 mmol/L (ref 3.5–5.1)
Sodium: 141 mmol/L (ref 135–145)

## 2015-11-04 ENCOUNTER — Other Ambulatory Visit (HOSPITAL_COMMUNITY): Payer: Self-pay | Admitting: Internal Medicine

## 2015-11-09 ENCOUNTER — Other Ambulatory Visit (HOSPITAL_COMMUNITY): Payer: Self-pay | Admitting: *Deleted

## 2015-11-09 ENCOUNTER — Encounter: Payer: Self-pay | Admitting: Cardiology

## 2015-11-09 ENCOUNTER — Ambulatory Visit (INDEPENDENT_AMBULATORY_CARE_PROVIDER_SITE_OTHER): Payer: Medicaid Other

## 2015-11-09 DIAGNOSIS — Z95810 Presence of automatic (implantable) cardiac defibrillator: Secondary | ICD-10-CM

## 2015-11-09 DIAGNOSIS — I5043 Acute on chronic combined systolic (congestive) and diastolic (congestive) heart failure: Secondary | ICD-10-CM

## 2015-11-09 MED ORDER — CARVEDILOL 25 MG PO TABS: 25.0000 mg | ORAL_TABLET | Freq: Two times a day (BID) | ORAL | Status: DC

## 2015-11-09 NOTE — Progress Notes (Signed)
EPIC Encounter for ICM Monitoring  Patient Name: Eric Murillo is a 55 y.o. male Date: 11/09/2015 Primary Care Physican: Darreld Mclean, MD Primary Cardiologist: Shirlee Latch Electrophysiologist: Graciela Husbands Dry Weight: 320 lbs   In the past month, have you:  1. Gained more than 2 pounds in a day or more than 5 pounds in a week? no  2. Had changes in your medications (with verification of current medications)? no  3. Had more shortness of breath than is usual for you? no  4. Limited your activity because of shortness of breath? no  5. Not been able to sleep because of shortness of breath? no  6. Had increased swelling in your feet or ankles? no  7. Had symptoms of dehydration (dizziness, dry mouth, increased thirst, decreased urine output) no  8. Had changes in sodium restriction? no  9. Been compliant with medication? Yes   ICM trend: 3 month view for 11/09/2015   ICM trend: 1 year view for 11/09/2015   Follow-up plan: ICM clinic phone appointment on 12/27/2015 and office appointment at CHF clinic on 11/26/2015.  Thoracic impedance below reference line from 10/18/2015 to 10/29/2015 suggesting fluid accumulation and returned to reference line 10/30/2015 which correlates with taking extra Torsemide as prescribed by CHF clinic.  Fluid levels stabilized 10/30/2015.  Patient denied fluid symptoms.  Education given to limit sodium intake to < 2000 mg and fluid intake to 64 oz daily.  Encouraged to call for any fluid symptoms.  No changes today.     Karie Soda, RN, CCM 11/09/2015 2:37 PM

## 2015-11-15 ENCOUNTER — Other Ambulatory Visit (HOSPITAL_COMMUNITY): Payer: Self-pay | Admitting: Cardiology

## 2015-11-16 ENCOUNTER — Other Ambulatory Visit (HOSPITAL_COMMUNITY): Payer: Self-pay | Admitting: Cardiology

## 2015-11-17 ENCOUNTER — Other Ambulatory Visit (HOSPITAL_COMMUNITY): Payer: Self-pay | Admitting: *Deleted

## 2015-11-17 ENCOUNTER — Telehealth (HOSPITAL_COMMUNITY): Payer: Self-pay | Admitting: Vascular Surgery

## 2015-11-17 ENCOUNTER — Telehealth: Payer: Self-pay | Admitting: *Deleted

## 2015-11-17 MED ORDER — TORSEMIDE 20 MG PO TABS: ORAL_TABLET | ORAL | Status: DC

## 2015-11-17 NOTE — Telephone Encounter (Signed)
Pt refill Torsemide sent to [facility] in [place]

## 2015-11-17 NOTE — Telephone Encounter (Signed)
torsemide (DEMADEX) 20 MG tablet  Medication   Date: 11/17/2015  Department: [facility]  Ordering/Authorizing: Laurey Morale, MD      Order Providers    Prescribing Provider Encounter Provider   Laurey Morale, MD Modesta Messing, CMA    Medication Detail      Disp Refills Start End     torsemide (DEMADEX) 20 MG tablet 180 tablet 2 11/17/2015     Sig: Take 60 mg (3 tablets) in the morning and 40 mg (2 tablets) in the afternoon, may take additional 20 mg in the PM for weight gain 3lbs overnight and 5 lbs in a week    Class: Print     Pharmacy    [id number] - [place], [place] - [address] AT [place] [address]

## 2015-11-18 ENCOUNTER — Other Ambulatory Visit (HOSPITAL_COMMUNITY): Payer: Self-pay | Admitting: *Deleted

## 2015-11-18 MED ORDER — TORSEMIDE 20 MG PO TABS: ORAL_TABLET | ORAL | Status: DC

## 2015-11-24 ENCOUNTER — Other Ambulatory Visit: Payer: Self-pay | Admitting: Internal Medicine

## 2015-11-25 ENCOUNTER — Other Ambulatory Visit (HOSPITAL_COMMUNITY): Payer: Self-pay | Admitting: *Deleted

## 2015-11-25 ENCOUNTER — Telehealth (HOSPITAL_COMMUNITY): Payer: Self-pay | Admitting: Vascular Surgery

## 2015-11-25 NOTE — Telephone Encounter (Signed)
Last appointment 06/09/2015. Next appointment 12/29/2015.

## 2015-11-25 NOTE — Telephone Encounter (Signed)
[facility] [place]

## 2015-11-26 ENCOUNTER — Encounter (HOSPITAL_COMMUNITY): Payer: Medicaid Other

## 2015-11-30 ENCOUNTER — Ambulatory Visit (INDEPENDENT_AMBULATORY_CARE_PROVIDER_SITE_OTHER): Payer: Medicaid Other | Admitting: *Deleted

## 2015-11-30 DIAGNOSIS — Z95810 Presence of automatic (implantable) cardiac defibrillator: Secondary | ICD-10-CM

## 2015-11-30 DIAGNOSIS — I428 Other cardiomyopathies: Secondary | ICD-10-CM

## 2015-11-30 DIAGNOSIS — I429 Cardiomyopathy, unspecified: Secondary | ICD-10-CM

## 2015-12-01 NOTE — Progress Notes (Signed)
Remote ICD transmission.   

## 2015-12-27 ENCOUNTER — Ambulatory Visit (INDEPENDENT_AMBULATORY_CARE_PROVIDER_SITE_OTHER): Payer: Medicaid Other

## 2015-12-27 DIAGNOSIS — Z95810 Presence of automatic (implantable) cardiac defibrillator: Secondary | ICD-10-CM | POA: Diagnosis not present

## 2015-12-27 DIAGNOSIS — I5043 Acute on chronic combined systolic (congestive) and diastolic (congestive) heart failure: Secondary | ICD-10-CM | POA: Diagnosis not present

## 2015-12-27 NOTE — Progress Notes (Signed)
EPIC Encounter for ICM Monitoring  Patient Name: Eric Murillo is a 55 y.o. male Date: 12/27/2015 Primary Care Physican: Darreld Mclean, MD Primary Cardiologist: Shirlee Latch Electrophysiologist: Graciela Husbands Dry Weight:  unknown       In the past month, have you:  1. Gained more than 2 pounds in a day or more than 5 pounds in a week? unknown  2. Had changes in your medications (with verification of current medications)? no  3. Had more shortness of breath than is usual for you? no  4. Limited your activity because of shortness of breath? no  5. Not been able to sleep because of shortness of breath? no  6. Had increased swelling in your feet, ankles, legs or stomach area? no  7. Had symptoms of dehydration (dizziness, dry mouth, increased thirst, decreased urine output) no  8. Had changes in sodium restriction? no  9. Been compliant with medication? Yes  ICM trend: 3 month view for 12/27/2015   ICM trend: 1 year view for 12/27/2015   Follow-up plan: ICM clinic phone appointment 01/28/2016.    FLUID LEVELS:  Corvue Optivol thoracic impedance trending along baseline suggesting stable fluid levels.    SYMPTOMS:  None.  Denied any symptoms such as weight gain of 3 pounds overnight or 5 pounds within a week, SOB and/or lower extremity swelling. Encouraged to call for any fluid symptoms.   EDUCATION: Limit sodium intake to < 2000 mg and fluid intake to 64 oz daily.     RECOMMENDATIONS: No changes today.     Karie Soda, RN, CCM 12/27/2015 12:13 PM

## 2015-12-29 ENCOUNTER — Ambulatory Visit (INDEPENDENT_AMBULATORY_CARE_PROVIDER_SITE_OTHER): Payer: Medicaid Other | Admitting: Internal Medicine

## 2015-12-29 ENCOUNTER — Encounter: Payer: Self-pay | Admitting: Internal Medicine

## 2015-12-29 VITALS — BP 122/81 | HR 99 | Temp 98.4°F | Wt 341.9 lb

## 2015-12-29 DIAGNOSIS — Z95810 Presence of automatic (implantable) cardiac defibrillator: Secondary | ICD-10-CM

## 2015-12-29 DIAGNOSIS — I5043 Acute on chronic combined systolic (congestive) and diastolic (congestive) heart failure: Secondary | ICD-10-CM | POA: Diagnosis present

## 2015-12-29 DIAGNOSIS — M1990 Unspecified osteoarthritis, unspecified site: Secondary | ICD-10-CM

## 2015-12-29 DIAGNOSIS — Z79891 Long term (current) use of opiate analgesic: Secondary | ICD-10-CM

## 2015-12-29 DIAGNOSIS — Z79899 Other long term (current) drug therapy: Secondary | ICD-10-CM | POA: Diagnosis not present

## 2015-12-29 DIAGNOSIS — R252 Cramp and spasm: Secondary | ICD-10-CM

## 2015-12-29 DIAGNOSIS — M609 Myositis, unspecified: Secondary | ICD-10-CM | POA: Insufficient documentation

## 2015-12-29 DIAGNOSIS — T466X5A Adverse effect of antihyperlipidemic and antiarteriosclerotic drugs, initial encounter: Secondary | ICD-10-CM

## 2015-12-29 DIAGNOSIS — M19079 Primary osteoarthritis, unspecified ankle and foot: Secondary | ICD-10-CM

## 2015-12-29 MED ORDER — OXYCODONE-ACETAMINOPHEN 10-325 MG PO TABS: 1.0000 | ORAL_TABLET | Freq: Four times a day (QID) | ORAL | Status: DC | PRN

## 2015-12-29 NOTE — Patient Instructions (Signed)
Please increase your Torsemide to 60 mg in the morning and 60 mg in the evening beginning today (12/29/15) and continue through Saturday (01/01/16).  On 01/02/16 you can resume your regular Torsemide dose of 60 mg in the morning and 40 mg in the evening.  Please check your weight at home daily.  We are checking some lab work to determine the cause of your muscle cramps. Sometimes this can be caused by the class of cholesterol medications you are taking.   Heart Failure Heart failure is a condition in which the heart has trouble pumping blood. This means your heart does not pump blood efficiently for your body to work well. In some cases of heart failure, fluid may back up into your lungs or you may have swelling (edema) in your lower legs. Heart failure is usually a long-term (chronic) condition. It is important for you to take good care of yourself and follow your health care provider's treatment plan. CAUSES  Some health conditions can cause heart failure. Those health conditions include:  High blood pressure (hypertension). Hypertension causes the heart muscle to work harder than normal. When pressure in the blood vessels is high, the heart needs to pump (contract) with more force in order to circulate blood throughout the body. High blood pressure eventually causes the heart to become stiff and weak.  Coronary artery disease (CAD). CAD is the buildup of cholesterol and fat (plaque) in the arteries of the heart. The blockage in the arteries deprives the heart muscle of oxygen and blood. This can cause chest pain and may lead to a heart attack. High blood pressure can also contribute to CAD.  Heart attack (myocardial infarction). A heart attack occurs when one or more arteries in the heart become blocked. The loss of oxygen damages the muscle tissue of the heart. When this happens, part of the heart muscle dies. The injured tissue does not contract as well and weakens the heart's ability to pump  blood.  Abnormal heart valves. When the heart valves do not open and close properly, it can cause heart failure. This makes the heart muscle pump harder to keep the blood flowing.  Heart muscle disease (cardiomyopathy or myocarditis). Heart muscle disease is damage to the heart muscle from a variety of causes. These can include drug or alcohol abuse, infections, or unknown reasons. These can increase the risk of heart failure.  Lung disease. Lung disease makes the heart work harder because the lungs do not work properly. This can cause a strain on the heart, leading it to fail.  Diabetes. Diabetes increases the risk of heart failure. High blood sugar contributes to high fat (lipid) levels in the blood. Diabetes can also cause slow damage to tiny blood vessels that carry important nutrients to the heart muscle. When the heart does not get enough oxygen and food, it can cause the heart to become weak and stiff. This leads to a heart that does not contract efficiently.  Other conditions can contribute to heart failure. These include abnormal heart rhythms, thyroid problems, and low blood counts (anemia). Certain unhealthy behaviors can increase the risk of heart failure, including:  Being overweight.  Smoking or chewing tobacco.  Eating foods high in fat and cholesterol.  Abusing illicit drugs or alcohol.  Lacking physical activity. SYMPTOMS  Heart failure symptoms may vary and can be hard to detect. Symptoms may include:  Shortness of breath with activity, such as climbing stairs.  Persistent cough.  Swelling of the feet, ankles,  legs, or abdomen.  Unexplained weight gain.  Difficulty breathing when lying flat (orthopnea).  Waking from sleep because of the need to sit up and get more air.  Rapid heartbeat.  Fatigue and loss of energy.  Feeling light-headed, dizzy, or close to fainting.  Loss of appetite.  Nausea.  Increased urination during the night  (nocturia). DIAGNOSIS  A diagnosis of heart failure is based on your history, symptoms, physical examination, and diagnostic tests. Diagnostic tests for heart failure may include:  Echocardiography.  Electrocardiography.  Chest X-ray.  Blood tests.  Exercise stress test.  Cardiac angiography.  Radionuclide scans. TREATMENT  Treatment is aimed at managing the symptoms of heart failure. Medicines, behavioral changes, or surgical intervention may be necessary to treat heart failure.  Medicines to help treat heart failure may include:  Angiotensin-converting enzyme (ACE) inhibitors. This type of medicine blocks the effects of a blood protein called angiotensin-converting enzyme. ACE inhibitors relax (dilate) the blood vessels and help lower blood pressure.  Angiotensin receptor blockers (ARBs). This type of medicine blocks the actions of a blood protein called angiotensin. Angiotensin receptor blockers dilate the blood vessels and help lower blood pressure.  Water pills (diuretics). Diuretics cause the kidneys to remove salt and water from the blood. The extra fluid is removed through urination. This loss of extra fluid lowers the volume of blood the heart pumps.  Beta blockers. These prevent the heart from beating too fast and improve heart muscle strength.  Digitalis. This increases the force of the heartbeat.  Healthy behavior changes include:  Obtaining and maintaining a healthy weight.  Stopping smoking or chewing tobacco.  Eating heart-healthy foods.  Limiting or avoiding alcohol.  Stopping illicit drug use.  Physical activity as directed by your health care provider.  Surgical treatment for heart failure may include:  A procedure to open blocked arteries, repair damaged heart valves, or remove damaged heart muscle tissue.  A pacemaker to improve heart muscle function and control certain abnormal heart rhythms.  An internal cardioverter defibrillator to treat  certain serious abnormal heart rhythms.  A left ventricular assist device (LVAD) to assist the pumping ability of the heart. HOME CARE INSTRUCTIONS   Take medicines only as directed by your health care provider. Medicines are important in reducing the workload of your heart, slowing the progression of heart failure, and improving your symptoms.  Do not stop taking your medicine unless directed by your health care provider.  Do not skip any dose of medicine.  Refill your prescriptions before you run out of medicine. Your medicines are needed every day.  Engage in moderate physical activity if directed by your health care provider. Moderate physical activity can benefit some people. The elderly and people with severe heart failure should consult with a health care provider for physical activity recommendations.  Eat heart-healthy foods. Food choices should be free of trans fat and low in saturated fat, cholesterol, and salt (sodium). Healthy choices include fresh or frozen fruits and vegetables, fish, lean meats, legumes, fat-free or low-fat dairy products, and whole grain or high fiber foods. Talk to a dietitian to learn more about heart-healthy foods.  Limit sodium if directed by your health care provider. Sodium restriction may reduce symptoms of heart failure in some people. Talk to a dietitian to learn more about heart-healthy seasonings.  Use healthy cooking methods. Healthy cooking methods include roasting, grilling, broiling, baking, poaching, steaming, or stir-frying. Talk to a dietitian to learn more about healthy cooking methods.  Limit fluids  if directed by your health care provider. Fluid restriction may reduce symptoms of heart failure in some people.  Weigh yourself every day. Daily weights are important in the early recognition of excess fluid. You should weigh yourself every morning after you urinate and before you eat breakfast. Wear the same amount of clothing each time you  weigh yourself. Record your daily weight. Provide your health care provider with your weight record.  Monitor and record your blood pressure if directed by your health care provider.  Check your pulse if directed by your health care provider.  Lose weight if directed by your health care provider. Weight loss may reduce symptoms of heart failure in some people.  Stop smoking or chewing tobacco. Nicotine makes your heart work harder by causing your blood vessels to constrict. Do not use nicotine gum or patches before talking to your health care provider.  Keep all follow-up visits as directed by your health care provider. This is important.  Limit alcohol intake to no more than 1 drink per day for nonpregnant women and 2 drinks per day for men. One drink equals 12 ounces of beer, 5 ounces of wine, or 1 ounces of hard liquor. Drinking more than that is harmful to your heart. Tell your health care provider if you drink alcohol several times a week. Talk with your health care provider about whether alcohol is safe for you. If your heart has already been damaged by alcohol or you have severe heart failure, drinking alcohol should be stopped completely.  Stop illicit drug use.  Stay up-to-date with immunizations. It is especially important to prevent respiratory infections through current pneumococcal and influenza immunizations.  Manage other health conditions such as hypertension, diabetes, thyroid disease, or abnormal heart rhythms as directed by your health care provider.  Learn to manage stress.  Plan rest periods when fatigued.  Learn strategies to manage high temperatures. If the weather is extremely hot:  Avoid vigorous physical activity.  Use air conditioning or fans or seek a cooler location.  Avoid caffeine and alcohol.  Wear loose-fitting, lightweight, and light-colored clothing.  Learn strategies to manage cold temperatures. If the weather is extremely cold:  Avoid vigorous  physical activity.  Layer clothes.  Wear mittens or gloves, a hat, and a scarf when going outside.  Avoid alcohol.  Obtain ongoing education and support as needed.  Participate in or seek rehabilitation as needed to maintain or improve independence and quality of life. SEEK MEDICAL CARE IF:   You have a rapid weight gain.  You have increasing shortness of breath that is unusual for you.  You are unable to participate in your usual physical activities.  You tire easily.  You cough more than normal, especially with physical activity.  You have any or more swelling in areas such as your hands, feet, ankles, or abdomen.  You are unable to sleep because it is hard to breathe.  You feel like your heart is beating fast (palpitations).  You become dizzy or light-headed upon standing up. SEEK IMMEDIATE MEDICAL CARE IF:   You have difficulty breathing.  There is a change in mental status such as decreased alertness or difficulty with concentration.  You have a pain or discomfort in your chest.  You have an episode of fainting (syncope). MAKE SURE YOU:   Understand these instructions.  Will watch your condition.  Will get help right away if you are not doing well or get worse.   This information is not intended  to replace advice given to you by your health care provider. Make sure you discuss any questions you have with your health care provider.   Document Released: 07/10/2005 Document Revised: 11/24/2014 Document Reviewed: 08/09/2012 Elsevier Interactive Patient Education [employer]

## 2015-12-29 NOTE — Assessment & Plan Note (Addendum)
Patient with HFrEF with NICM and EF of 30-35% on most recent TTE s/p ICD placement. Likely secondary to HTN as SPEP and fat pad biopsy negative for amyloidosis. He reports increased swelling of his lower extremities, and some shortness of breath when ambulating over the last 1-2 weeks. He regularly sleeps with multiple pillows under his head, but has not had to increase as he denies worsening orthopnea or PND. He reports compliance to his home Torsemide (60 am, 40 pm), Coreg, Bidil, Spironolactone, and Lisinopril. He has an increase in his weight from 332 lbs on March 20th to 341 lbs today. He does admit that he has increased fluid intake above his daily limit of 2L as well as eating a lot of watermelon recently. He is avoiding salty and fried foods. He is not getting much exercise. He denies any chest pain, palpitations, dizziness, or syncope.  A/P: Patient appears to have increased volume status with weight gain over the last 2.5 months with mild exacerbation of his CHF. He has pitting edema in both legs. Lungs are clear without rales and cardiac exam reveals a regular rhythm, no sign that he is back in Afib/flutter. This is likely due to his fluid intake, which will need to be reduced. Have advised patient to increase his home Torsemide to 60 mg twice a day through Saturday 01/01/16 and then resume his home dose. He will see if he can follow up with the [facility] earlier than his scheduled appointment for further management. -Increase Torsemide to 60 mg BID from 6/7 - 6/10. Resume home 60 mg am and 40 mg pm afterwards -Continue home Coreg 25 mg BID, Bidil 20-37.5 two tabs TID, Spironolactone 25 mg daily, and Lisinopril 2.5 mg daily. -follow up with CHF clinic

## 2015-12-29 NOTE — Assessment & Plan Note (Addendum)
Patient reports 1-2 weeks of bilateral lower extremity muscle cramps occurring at the back of his legs. He notices it more after driving long distances when he is getting out of his car. He does not think the onset of pain is after walking for a while. He has not noticed any skin changes, erythema, or significant difference in size of either leg compared to the other.  A/P: Differential includes statin-induced myopathy, hypokalemia in the setting of torsemide use, claudication, or DVT. He does not have classic claudication symptoms and does not have physical exam findings for DVT although he has naturally large legs which may limit exam. It would be unusual to have bilateral DVTs, especially in the setting of chronic anticoagulation with Apixaban. Although he is on torsemide, he does take supplemental potassium at home and is also on a potassium-sparing diuretic and ACE-I which may counteract drops in serum potassium. 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 I discussed the results with Mr. Schwoerer. I advised him to hold his simvastatin for at least 2 weeks. We will try to schedule him for follow up in 2-3 weeks to recheck CK and monitor for improvement in his myalgias. Would consider a switch to pravastatin or fluvastatin at a lower dose and titrate up if CK improved. Patient understands and agrees.

## 2015-12-29 NOTE — Progress Notes (Signed)
Patient ID: Eric Murillo, male   DOB: 27-Oct-1960, 55 y.o.   MRN: [id number]   Subjective:   Patient ID: Eric Murillo male   DOB: 02/14/1961 55 y.o.   MRN: [id number]  HPI: [name] is a 55 y.o. gentleman with PMH as listed below who presents with complaint of cramping in his legs and for management of his CHF and medication refill. Please see problem list for status of patient's chronic medical issues.   Past Medical History  Diagnosis Date  . Hypertension   . Hyperlipidemia   . Chronic systolic congestive heart failure, NYHA class 2 (HCC)     EF 20-25% 1/16  . Organic erectile dysfunction   . Morbid obesity with BMI of 45.0-49.9, adult (HCC)   . Gout     Of big toe  . CKD (chronic kidney disease) stage 3, GFR 30-59 ml/min   . Atrial flutter (HCC)     DCCV 9/15  . Non-ischemic cardiomyopathy (HCC)   . OSA (obstructive sleep apnea)   . Submandibular sialolithiasis     Right  . Pilonidal cyst   . Dyslipidemia   . Anemia of chronic disease   . Chest pain on exertion   . Hyperglycemia   . Arthritis    Current Outpatient Prescriptions  Medication Sig Dispense Refill  . allopurinol (ZYLOPRIM) 100 MG tablet TAKE 1 TABLET BY MOUTH TWO TIMES DAILY 60 tablet 2  . apixaban (ELIQUIS) 5 MG TABS tablet Take 5 mg by mouth 2 (two) times daily.    Marland Kitchen BIDIL 20-37.5 MG tablet TAKE 2 TABLETS BY MOUTH 3 TIMES A DAY 180 tablet 3  . carvedilol (COREG) 25 MG tablet TAKE 1 TABLET BY MOUTH TWICE A DAY WITH A MEAL 60 tablet 6  . carvedilol (COREG) 25 MG tablet Take 1 tablet (25 mg total) by mouth 2 (two) times daily with a meal. 60 tablet 3  . colchicine 0.6 MG tablet Take 0.6 mg by mouth 2 (two) times daily as needed (gouty flare).    Marland Kitchen ELIQUIS 5 MG TABS tablet TAKE 1 TABLET BY MOUTH TWICE A DAY 60 tablet 6  . KLOR-CON M20 20 MEQ tablet TAKE 1 TABLET BY MOUTH TWICE A DAY 60 tablet 3  . lisinopril (PRINIVIL,ZESTRIL) 2.5 MG tablet Take 1 tablet (2.5 mg total) by mouth daily. 30 tablet 3  .  oxyCODONE-acetaminophen (PERCOCET) 10-325 MG tablet Take 1 tablet by mouth every 6 (six) hours as needed for pain. 90 tablet 0  . simvastatin (ZOCOR) 40 MG tablet TAKE 1 TABLET (40 MG TOTAL) BY MOUTH AT BEDTIME. 30 tablet 2  . spironolactone (ALDACTONE) 25 MG tablet TAKE 1 TABLET (25 MG TOTAL) BY MOUTH DAILY. 30 tablet 5  . torsemide (DEMADEX) 20 MG tablet Take 60 mg (3 tablets) in the morning and 40 mg (2 tablets) in the afternoon 180 tablet 2  . metolazone (ZAROXOLYN) 2.5 MG tablet Take 1 tablet (2.5 mg total) by mouth once. (Patient not taking: Reported on 09/07/2015) 15 tablet 3   No current facility-administered medications for this visit.   Family History  Problem Relation Age of Onset  . Hypertension Mother   . Hypertension Father   . Cancer Father     brother died of brain cancer; sister died of bone cancer  . Diabetes Sister   . Diabetes Brother   . Colon cancer Maternal Aunt   . Cardiomyopathy Neg Hx    Social History   Social History  . Marital Status: Single  Spouse Name: N/A  . Number of Children: N/A  . Years of Education: N/A   Social History Main Topics  . Smoking status: Never Smoker   . Smokeless tobacco: Never Used  . Alcohol Use: No  . Drug Use: No  . Sexual Activity: Not Asked   Other Topics Concern  . None   Social History Narrative   Financial assistance approved for 100% discount at [facility] and has [facility] card   [employer]  March 16, 2010 9:42 AM      Lives with mother.  Has a girlfriend   Review of Systems: Review of Systems  Constitutional: Negative for weight loss, malaise/fatigue and diaphoresis.  Respiratory:       Shortness of breath with ambulation  Cardiovascular: Positive for leg swelling. Negative for chest pain, palpitations, orthopnea, claudication and PND.  Musculoskeletal: Positive for myalgias and joint pain. Negative for falls.  Skin: Negative for rash.  Neurological: Negative for dizziness, tingling, focal weakness and  weakness.    Objective:  Physical Exam: Filed Vitals:   12/29/15 1336  BP: 122/81  Pulse: 99  Temp: 98.4 F (36.9 C)  TempSrc: Oral  Weight: 341 lb 14.4 oz (155.085 kg)  SpO2: 98%   Physical Exam  Constitutional: He is oriented to person, place, and time. He appears well-developed and well-nourished. No distress.  Obese male, no acute distress  HENT:  Head: Normocephalic and atraumatic.  Eyes: EOM are normal.  Cardiovascular: Normal rate and regular rhythm.   No murmur heard. Pulmonary/Chest: Effort normal. No respiratory distress. He has no wheezes. He has no rales.  Musculoskeletal:  +2 pitting edema bilateral lower extremities about 3/4 up the shin. No tenderness to palpation of his thighs or lower extremities. No erythema or difference in size between each leg.  Neurological: He is alert and oriented to person, place, and time.  Strength 5/5 in all extremities. Able to rise from chair without using arms to lift himself and climb onto examination table without issue. Gait intact.  Skin: Skin is warm and dry. No rash noted. He is not diaphoretic. No erythema.  Psychiatric: He has a normal mood and affect.    Assessment & Plan:  Please see problem based charting for current assessment and plan.

## 2015-12-29 NOTE — Assessment & Plan Note (Addendum)
Patient reports worsening pain and requesting refill of home Percocet which allows him to increase activity with pain control.  A/P: -Refilled Percocet 10-325 mg q6h prn #90 -Check UDS on follow up

## 2015-12-30 ENCOUNTER — Telehealth: Payer: Self-pay

## 2015-12-30 LAB — BMP8+ANION GAP
Anion Gap: 17 mmol/L (ref 10.0–18.0)
BUN / CREAT RATIO: 12 (ref 9–20)
BUN: 20 mg/dL (ref 6–24)
CO2: 28 mmol/L (ref 18–29)
Calcium: 9.4 mg/dL (ref 8.7–10.2)
Chloride: 97 mmol/L (ref 96–106)
Creatinine, Ser: 1.67 mg/dL — ABNORMAL HIGH (ref 0.76–1.27)
GFR calc Af Amer: 52 mL/min/{1.73_m2} — ABNORMAL LOW (ref >59)
GFR calc non Af Amer: 45 mL/min/{1.73_m2} — ABNORMAL LOW (ref >59)
GLUCOSE: 121 mg/dL — AB (ref 65–99)
Potassium: 4.5 mmol/L (ref 3.5–5.2)
SODIUM: 142 mmol/L (ref 134–144)

## 2015-12-30 LAB — CK: CK TOTAL: 485 U/L — AB (ref 24–204)

## 2015-12-30 NOTE — Progress Notes (Signed)
Internal Medicine Clinic Attending  Case discussed with Dr. Darreld Mclean at the time of the visit.  We reviewed the resident's history and exam and pertinent patient test results.  I agree with the assessment, diagnosis, and plan of care documented in the resident's note.  Patient with proximal muscle aches. CK elevated at 485. He has been on simvastatin since 2009 for primary prevention of CVD. Now has non-ischemic cardiomyopathy, last imaging stress 2016 was low risk. I would stop the simvastatin for at least two weeks for a washout. Then can start low dose pravastatin and work up the dose as tolerated in the future to continue primary prevention of ischemic disease.

## 2015-12-30 NOTE — Telephone Encounter (Addendum)
Eric Murillo is a 55 y.o. male who was contacted via telephone for monitoring of apixaban (Eliquis) therapy.    ASSESSMENT Indication(s): ICD Duration: indefinite  Labs:    Component Value Date/Time   AST 22 04/20/2014 2138   ALT 20 04/20/2014 2138   NA 142 12/29/2015 1411   NA 141 10/22/2015 1018   K 4.5 12/29/2015 1411   CL 97 12/29/2015 1411   CO2 28 12/29/2015 1411   GLUCOSE 121* 12/29/2015 1411   GLUCOSE 112* 10/22/2015 1018   HGBA1C 5.4 07/10/2013 1611   BUN 20 12/29/2015 1411   BUN 16 10/22/2015 1018   CREATININE 1.67* 12/29/2015 1411   CREATININE 1.44* 05/14/2015 0959   CALCIUM 9.4 12/29/2015 1411   GFRNONAA 45* 12/29/2015 1411   GFRNONAA 65 07/10/2013 1604   GFRAA 52* 12/29/2015 1411   GFRAA 75 07/10/2013 1604   WBC 9.5 07/24/2015 1219   HGB 11.7* 07/24/2015 1219   HCT 37.9* 07/24/2015 1219   PLT 170 07/24/2015 1219    apixaban (Eliquis) Dose: 5 mg BID  Safety: Patient had no recent bleeding/thromboembolic events. Patient reports no recent signs or symptoms of bleeding, no signs of symptoms of thromboembolism. Medication changes: lisinopril recently added.  Adherence: Patient has no known adherence challenges. Patient does correctly recite the dose. Contacted pharmacy and records indicate refills are moderatly consistent. Fills: 6/1, 5/9, 3/22 (one month supplies).  Patient Instructions: Patient advised to contact clinic or seek medical attention if signs/symptoms of bleeding or thromboembolism occur. Patient verbalized understanding by repeating back information.  Follow-up Recommended labs to consider: LFTs, A1c . Next appointment: 04/27/11 Internal Med follow up.  Duane Lope PharmD Candidate  12/30/2015, 2:08 PM

## 2015-12-30 NOTE — Telephone Encounter (Signed)
Eric Murillo is a 55 y.o. male who was contacted via telephone for monitoring of apixaban (Eliquis) therapy. No answer on either phone. Left message and will call back again.    Duane Lope PharmD Candidate  12/30/2015, 1:33 PM

## 2016-01-05 ENCOUNTER — Encounter: Payer: Self-pay | Admitting: Cardiology

## 2016-01-06 LAB — CUP PACEART REMOTE DEVICE CHECK
Battery Remaining Longevity: 95 mo
Battery Remaining Percentage: 94 %
Brady Statistic RV Percent Paced: 1 %
HIGH POWER IMPEDANCE MEASURED VALUE: 74 Ohm
HighPow Impedance: 74 Ohm
Implantable Lead Location: 753860
Implantable Lead Serial Number: 332395
Lead Channel Pacing Threshold Amplitude: 0.75 V
Lead Channel Setting Pacing Pulse Width: 0.5 ms
MDC IDC LEAD IMPLANT DT: 20161028
MDC IDC LEAD MODEL: 181
MDC IDC MSMT BATTERY VOLTAGE: 3.2 V
MDC IDC MSMT LEADCHNL RV IMPEDANCE VALUE: 390 Ohm
MDC IDC MSMT LEADCHNL RV PACING THRESHOLD PULSEWIDTH: 0.5 ms
MDC IDC MSMT LEADCHNL RV SENSING INTR AMPL: 12 mV
MDC IDC PG SERIAL: 7305918
MDC IDC SESS DTM: 20170509070654
MDC IDC SET LEADCHNL RV PACING AMPLITUDE: 2.5 V
MDC IDC SET LEADCHNL RV SENSING SENSITIVITY: 0.5 mV

## 2016-01-11 ENCOUNTER — Telehealth (HOSPITAL_COMMUNITY): Payer: Self-pay | Admitting: *Deleted

## 2016-01-11 ENCOUNTER — Telehealth: Payer: Self-pay

## 2016-01-11 NOTE — Telephone Encounter (Signed)
Returned patient call.  He stated he has SOB walking which is worsened over the last 2 weeks.  Has gained 10 lbs in last month and swelling of the legs in the last couple of weeks.  He reported he spoke with Herbert Seta at CHF clinic today and waiting on her call back to let him know what he should do.  He stated he already sent a transmission.  I advised he has had some fluid accumulation since 01/03/2016. Advised Herbert Seta will call him back with recommendations from Dr McLean/Dr Gala Romney.  Advised I will check tomorrow morning regarding recommendations and schedule a follow up remote transmission if needed.

## 2016-01-11 NOTE — Telephone Encounter (Signed)
Corvue elevated, per Dr Gala Romney have pt take metolazone 2.5 mg for 2 days along w/extra 40 meq of KCL, pt aware and agreeable, if not getting better he will let us know

## 2016-01-12 ENCOUNTER — Encounter: Payer: Self-pay | Admitting: Internal Medicine

## 2016-01-12 ENCOUNTER — Ambulatory Visit (INDEPENDENT_AMBULATORY_CARE_PROVIDER_SITE_OTHER): Payer: Medicaid Other | Admitting: Internal Medicine

## 2016-01-12 VITALS — BP 129/82 | HR 86 | Temp 98.9°F | Ht 69.5 in | Wt 341.9 lb

## 2016-01-12 DIAGNOSIS — I5043 Acute on chronic combined systolic (congestive) and diastolic (congestive) heart failure: Secondary | ICD-10-CM | POA: Diagnosis not present

## 2016-01-12 DIAGNOSIS — M609 Myositis, unspecified: Secondary | ICD-10-CM | POA: Diagnosis not present

## 2016-01-12 DIAGNOSIS — T466X5A Adverse effect of antihyperlipidemic and antiarteriosclerotic drugs, initial encounter: Secondary | ICD-10-CM

## 2016-01-12 NOTE — Assessment & Plan Note (Signed)
He is feeling better after taking metalozone 2.5mg  yesterday. Has another dose to take today. Weight is same but symptomatically feeling better.   Filed Vitals:   01/12/16 1000  BP: 129/82  Pulse: 86  Temp: 98.9 F (37.2 C)   - cont 60mg  am and 40 mg pm dose. cont coreg 25mg  bid, bidil 20-37.5 two tabs TID, spironolactone 25mg  daily + lisinopril 2.5mg  daily. Take 2nd dose of metalozone 2.5mg  today. - if not feeling better or getting worse, asked him to call us back or call the CHF clinic. - asked to limited fluid intake below <1500cc.  - f/up in 1 month if not worsening in the mean time.

## 2016-01-12 NOTE — Patient Instructions (Signed)
Keep your fluid intake less than 1.5 Liters  Take same medication for now. Take your dose of metalozone today.  If you are not feeling better, or feeling worse, either call us or call the CHF clinic.  Follow up in 1 month.  Continue to hold your cholesterol medicine

## 2016-01-12 NOTE — Progress Notes (Signed)
   Subjective:    Patient ID: Eric Murillo, male    DOB: [birth year], 55 y.o.   MRN: [id number]  HPI  55 yo male with combined CHF, OA, Aflutter, OSA, gout, muscle crams, presents for follow up of CHF and Muscle cramps.  Saw Dr. Darreld Mclean on 12/29/15, had SOB with walking and worsening volume status (2+ pitting edema upto the shin, normal lung exam), was thought to be due to increased fluid intake. Was told to take torsemid 60mg  bid for 3 days and then to resume home 60mg  am and 40 mg pm dose after woards. contonied coreg 25mg  bid, bidil 20-37.5 two tabs TID, spironolactone 25mg  daily + lisinopril 2.5mg  daily. Was asked to f/up at CHF clinic. He called CHF clinic, was asked to take metalozone 2.5mg  for 2 days along with extra 40 meq KCl and was told to call them back if not getting better.  He was also having muscle cramps, CK was elevated to 485, thought it was 2/2 to statin myositis. Was told to hold his simvastatin for 2 weeks and to repeat CK on follow up.   Weight today 341.9, was 341.9 last time too on 6/17. He took metolazone yesterday and going to take another dose today. He feels much better after taking yesterday's dose, able to walk further without getting SOB. Also not feeling congested in his chest anymore.   Continues to have muscle cramps but better after holding the simvastatin. Mainly on the posterior thigh bilaterally. No other complaints today.    Review of Systems  Constitutional: Negative for fever and chills.  HENT: Negative for congestion and sore throat.   Respiratory: Negative for chest tightness, shortness of breath and wheezing.   Cardiovascular: Positive for leg swelling. Negative for chest pain and palpitations.  Neurological: Negative for dizziness and numbness.       Objective:   Physical Exam  Constitutional: He is oriented to person, place, and time. He appears well-developed and well-nourished. No distress.  Morbidly obese male.   HENT:  Head:  Normocephalic and atraumatic.  Eyes: Conjunctivae are normal. Right eye exhibits no discharge. Left eye exhibits no discharge.  Cardiovascular: Normal rate and regular rhythm.  Exam reveals no gallop and no friction rub.   No murmur heard. Pulmonary/Chest: Effort normal and breath sounds normal. No respiratory distress. He has no wheezes. He has no rales. He exhibits no tenderness.  Abdominal: Soft. Bowel sounds are normal.  Obese abdomen.   Musculoskeletal:  2+ pitting edema upto his shins.   Neurological: He is alert and oriented to person, place, and time. No cranial nerve deficit.  Skin: He is not diaphoretic.     Filed Vitals:   01/12/16 1000  BP: 129/82  Pulse: 86  Temp: 98.9 F (37.2 C)        Assessment & Plan:  See problem based a&p.

## 2016-01-12 NOTE — Assessment & Plan Note (Signed)
Had muscle cramps, CK was elevated to 485 last visit. He feels better after stopping simvastatin but still has some cramps on posterior thighs.  -cont to hold statin. - repeat CK level. If remains elevated, will recheck again in 1 month - f/up in 1 month.

## 2016-01-13 LAB — BASIC METABOLIC PANEL
BUN/Creatinine Ratio: 12 (ref 9–20)
BUN: 22 mg/dL (ref 6–24)
CALCIUM: 9.1 mg/dL (ref 8.7–10.2)
CHLORIDE: 99 mmol/L (ref 96–106)
CO2: 30 mmol/L — ABNORMAL HIGH (ref 18–29)
CREATININE: 1.87 mg/dL — AB (ref 0.76–1.27)
GFR calc Af Amer: 46 mL/min/{1.73_m2} — ABNORMAL LOW (ref >59)
GFR calc non Af Amer: 40 mL/min/{1.73_m2} — ABNORMAL LOW (ref >59)
GLUCOSE: 118 mg/dL — AB (ref 65–99)
Potassium: 4.3 mmol/L (ref 3.5–5.2)
Sodium: 146 mmol/L — ABNORMAL HIGH (ref 134–144)

## 2016-01-13 LAB — CK: CK TOTAL: 324 U/L — AB (ref 24–204)

## 2016-01-13 NOTE — Progress Notes (Signed)
Internal Medicine Clinic Attending  Case discussed with Dr. Ahmed at the time of the visit.  We reviewed the resident's history and exam and pertinent patient test results.  I agree with the assessment, diagnosis, and plan of care documented in the resident's note. 

## 2016-01-17 ENCOUNTER — Ambulatory Visit (INDEPENDENT_AMBULATORY_CARE_PROVIDER_SITE_OTHER): Payer: Medicaid Other

## 2016-01-17 DIAGNOSIS — I5022 Chronic systolic (congestive) heart failure: Secondary | ICD-10-CM

## 2016-01-17 DIAGNOSIS — Z95810 Presence of automatic (implantable) cardiac defibrillator: Secondary | ICD-10-CM

## 2016-01-17 NOTE — Progress Notes (Signed)
EPIC Encounter for ICM Monitoring  Patient Name: Eric Murillo is a 55 y.o. male Date: 01/17/2016 Primary Care Physican: Darreld Mclean, MD Primary Cardiologist: Shirlee Latch Electrophysiologist: Graciela Husbands Dry Weight: Has not weighed      In the past month, have you:  1. Gained more than 2 pounds in a day or more than 5 pounds in a week? Unsure of how much he weighs today and thinks he has lost some of the weight he had gained  2. Had changes in your medications (with verification of current medications)? Metolazone 2.5 mg was added x 2 days only.    3. Had more shortness of breath than is usual for you? SOB has improved and no longer feeling congested  4. Limited your activity because of shortness of breath? No   5. Not been able to sleep because of shortness of breath? No   6. Had increased swelling in your feet, ankles, legs or stomach area? Leg swelling has resolved   7. Had symptoms of dehydration (dizziness, dry mouth, increased thirst, decreased urine output) No   8. Had changes in sodium restriction? No   9. Been compliant with medication? Yes   ICM trend: 3 month view for 01/17/2016   ICM trend: 1 year view for 01/17/2016   Follow-up plan: ICM clinic phone appointment 02/29/2016.  CHF clinic appointment 01/18/2016.  FLUID LEVELS: Corvue impedance has returned and above baseline since taking Metolazone x 2 days on 01/11/2016.    SYMPTOMS:  Unsure of how much weight he has lost, leg swelling improved and SOB has improved.     EDUCATION:  He stated he may have gotten fluid overload from drinking to much fluids.  He reported PCP advised to restrict fluid intake to 1.5 liters.  Encouraged him to discuss fluid limit intake at CHF clinic appointment on 01/18/2016.       RECOMMENDATIONS: No changes today.    Advised will send updated ICM transmission to Dr. Shirlee Latch and Dr. Graciela Husbands for review.  Impedance has returned and is above baseline after taking Metolazone on 01/11/2016 and 01/12/2016.     Karie Soda, RN, CCM 01/17/2016 12:37 PM

## 2016-01-18 ENCOUNTER — Ambulatory Visit (HOSPITAL_COMMUNITY)
Admission: RE | Admit: 2016-01-18 | Discharge: 2016-01-18 | Disposition: A | Discharge status: Home / Self Care | Payer: Medicaid Other | Source: Ambulatory Visit | Attending: Cardiology | Admitting: Cardiology

## 2016-01-18 ENCOUNTER — Encounter: Payer: Self-pay | Admitting: Internal Medicine

## 2016-01-18 ENCOUNTER — Encounter (HOSPITAL_COMMUNITY): Payer: Self-pay

## 2016-01-18 VITALS — BP 122/68 | HR 92 | Ht 69.5 in | Wt 333.0 lb

## 2016-01-18 DIAGNOSIS — I2510 Atherosclerotic heart disease of native coronary artery without angina pectoris: Secondary | ICD-10-CM | POA: Insufficient documentation

## 2016-01-18 DIAGNOSIS — G4733 Obstructive sleep apnea (adult) (pediatric): Secondary | ICD-10-CM | POA: Insufficient documentation

## 2016-01-18 DIAGNOSIS — I484 Atypical atrial flutter: Secondary | ICD-10-CM | POA: Diagnosis not present

## 2016-01-18 DIAGNOSIS — Z6841 Body Mass Index (BMI) 40.0 and over, adult: Secondary | ICD-10-CM | POA: Insufficient documentation

## 2016-01-18 DIAGNOSIS — Z79899 Other long term (current) drug therapy: Secondary | ICD-10-CM | POA: Diagnosis not present

## 2016-01-18 DIAGNOSIS — N183 Chronic kidney disease, stage 3 unspecified: Secondary | ICD-10-CM

## 2016-01-18 DIAGNOSIS — Z9889 Other specified postprocedural states: Secondary | ICD-10-CM | POA: Insufficient documentation

## 2016-01-18 DIAGNOSIS — I429 Cardiomyopathy, unspecified: Secondary | ICD-10-CM | POA: Diagnosis not present

## 2016-01-18 DIAGNOSIS — I5022 Chronic systolic (congestive) heart failure: Secondary | ICD-10-CM | POA: Diagnosis not present

## 2016-01-18 DIAGNOSIS — I130 Hypertensive heart and chronic kidney disease with heart failure and stage 1 through stage 4 chronic kidney disease, or unspecified chronic kidney disease: Secondary | ICD-10-CM | POA: Insufficient documentation

## 2016-01-18 DIAGNOSIS — Z8249 Family history of ischemic heart disease and other diseases of the circulatory system: Secondary | ICD-10-CM | POA: Diagnosis not present

## 2016-01-18 DIAGNOSIS — R0601 Orthopnea: Secondary | ICD-10-CM | POA: Diagnosis not present

## 2016-01-18 DIAGNOSIS — E785 Hyperlipidemia, unspecified: Secondary | ICD-10-CM | POA: Diagnosis not present

## 2016-01-18 DIAGNOSIS — R252 Cramp and spasm: Secondary | ICD-10-CM | POA: Insufficient documentation

## 2016-01-18 DIAGNOSIS — Z7901 Long term (current) use of anticoagulants: Secondary | ICD-10-CM | POA: Diagnosis not present

## 2016-01-18 DIAGNOSIS — M109 Gout, unspecified: Secondary | ICD-10-CM | POA: Insufficient documentation

## 2016-01-18 DIAGNOSIS — I440 Atrioventricular block, first degree: Secondary | ICD-10-CM | POA: Insufficient documentation

## 2016-01-18 DIAGNOSIS — K115 Sialolithiasis: Secondary | ICD-10-CM | POA: Insufficient documentation

## 2016-01-18 LAB — BASIC METABOLIC PANEL
ANION GAP: 8 (ref 5–15)
BUN: 33 mg/dL — ABNORMAL HIGH (ref 6–20)
CALCIUM: 9.4 mg/dL (ref 8.9–10.3)
CO2: 30 mmol/L (ref 22–32)
Chloride: 100 mmol/L — ABNORMAL LOW (ref 101–111)
Creatinine, Ser: 1.84 mg/dL — ABNORMAL HIGH (ref 0.61–1.24)
GFR calc Af Amer: 46 mL/min — ABNORMAL LOW (ref >60)
GFR, EST NON AFRICAN AMERICAN: 40 mL/min — AB (ref >60)
GLUCOSE: 145 mg/dL — AB (ref 65–99)
Potassium: 4.2 mmol/L (ref 3.5–5.1)
SODIUM: 138 mmol/L (ref 135–145)

## 2016-01-18 LAB — MAGNESIUM: MAGNESIUM: 2.5 mg/dL — AB (ref 1.7–2.4)

## 2016-01-18 MED ORDER — METOLAZONE 2.5 MG PO TABS: ORAL_TABLET | ORAL | Status: DC

## 2016-01-18 NOTE — Patient Instructions (Signed)
Routine lab work today. Will notify you of abnormal results, otherwise no news is good news!  May take metolazone 2.5 mg tablet once weekly AS NEEDED for weight gain 3 lbs or more.  Follow up 2 months with Dr. Shirlee Latch.  Do the following things EVERYDAY: 1) Weigh yourself in the morning before breakfast. Write it down and keep it in a log. 2) Take your medicines as prescribed 3) Eat low salt foods-Limit salt (sodium) to 2000 mg per day.  4) Stay as active as you can everyday 5) Limit all fluids for the day to less than 2 liters

## 2016-01-18 NOTE — Progress Notes (Signed)
Patient ID: Eric Murillo, male   DOB: 04-Nov-1960, 55 y.o.   MRN: [id number]    Advanced Heart Failure Clinic Note  PCP: Dr. Glenard Haring Cardiology: Dr Shirlee Latch  55 yo with history of morbid obesity, hyperlipidemia, HTN, atrial flutter (s/p TEE DC-CV 04/03/14), NICM with chronic systolic HF and severe OSA but unable to tolerate CPAP.    He was admitted in 04/2014 with increased exertional dyspnea and chest tightness.  RHC/LHC showed elevated filling pressures, preserved cardiac output, and no significant coronary disease.  He was diuresed and discharged home. TEE (9/15) with EF 25-30% with global hypokinesis, mild to moderate MR, mildly decreased RV systolic function.   He underwent work-up for cardiac amyloid. SPEP negative. Fat pad biopsy 6/16 with benign adipose tissue. Unable to get cMRI due to size.  Last echo in 6/16 showed EF 30-35%, PA systolic pressure 62 mmHg.  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    He returns for followup today. At last visit lisinopril added. Last week Corevue was elevated so instructed to take 2.5 mg metolazone x 2 days. He felt much better.  Weight down ~ 10 lbs with that.  Weight at home 330-333 (was up to 341 last week).   He had been drinking greater than 2 L prior to metolazone, now has cut back to 1.5 L. He did get really bad cramps with metolazone last week. Denies DOE currently, but did have some last week. Has to take a break walking around a grocery store. States he watches diet to try and lose weight, but no specific plan just "watches what he eats." He is not exercising.  Denies palpitations or lightheadedness.  Does have mild orthopnea and has occasional bendopnea, both improved with better diuresis.   Corevue: Fluid index above threshold prior to metolazone last week, Was volume overloaded for > 1 week prior to this (10 days). No VT/VF/AT/AF.  Today, fluid index below threshold with rising impedance.    Labs (5/14): K 3.7, creatinine 1.5 Labs (12/14): K 4.2, creatinine  1.27, LDL 84, HDL 31, TSH normal Labs (3/15): K 4.6, creatinine 1.29 Labs (10/07/13) K 3.9 Creatinine 1.44 Labs (11/14/13) K 4.2, creatinine 1.48 Labs (12/05/13) K 4.8, creatinine 2.01  Labs (12/2013): K 4.3, creatinine 1.85, BUN 36, pro-BNP 884 Labs (7/15): K 3.5, creatinine 1.53 Labs (9/15): K 4, creatinine 1.9.   Labs (10/15): K 4.3, creatinine 1.7 Labs (11/15): K 4.4, creatinine 2.19 Labs (06/29/2014) K 4.0 Creatinine 1.96  Labs (08/21/2014) K 3.9 Creatinine 1.78 Labs (08/04/2015) : K 4.2 Creatinine 1.66 BNP 173 Labs (6/17): K 4.3, creatinine 1.87  ECG (2/17): NSR, 1st degree AV block.   Past Medical History: 1. Nonischemic cardiomyopathy: Echo (3/12) with EF 30-35% and mildly dilated LV, diffuse LV hypokinesis, moderate MR, PA systolic pressure 55 mmHg.  Left and right heart cath (3/12): No angiographic CAD; mean RA 20, PA 76/41, mean PCWP 38, CI 2.1.  ANA, SPEP, and HIV negative.  TSH normal.  Denies drug abuse, heavy ETOH intake.  No family history of cardiomyopathy.  Cardiomyopathy may be due to long-standing HTN.  Echo (6/13): EF 35-40%, moderate LV dilation, moderate to severe LAE, PA systolic pressure 52 mmHg.  Unable to fit in magnet for cardiac MRI.  Echo (8/14) with EF 45%, moderately dilated LV, mild LVH, normal RV, PA systolic pressure 49 mmHg. TEE (9/15) with EF 25-30% with global hypokinesis, mild to moderate MR, mildly decreased RV systolic function. LHC/RHC (10/15) with no significant coronary disease; mean RA  14, PA 65/25 mean 42, unable to obtain PCWP but LVEDP 24, CI 2.69.  Echo (6/16) with EF 30-35%, PA systolic pressure 62 mmHg, RV normal size and systolic function. [employer] ICD.  2. ERECTILE DYSFUNCTION 3. HYPERTENSION  4. CKD: Stage III 5. DYSLIPIDEMIA  6. Obesity 7. Hyperlipidemia 8. Gout 9. OSA: Severe on 6/13 sleep study. Unable to use CPAP.  10. Sialolithiasis 11. Atypical Atrial Flutter: TEE-DC-CV (04/03/14) which was successful;    Family History: No history of  cardiomyopathy No history of cancer among first degree relatives father - died of MI (7s)  mother - HTN  Social History: Lives girlfriend.  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 tobacco - none alcohol - none drugs - none  ROS: All systems reviewed and negative except as per HPI.   Current Outpatient Prescriptions  Medication Sig Dispense Refill  . allopurinol (ZYLOPRIM) 100 MG tablet TAKE 1 TABLET BY MOUTH TWO TIMES DAILY 60 tablet 2  . apixaban (ELIQUIS) 5 MG TABS tablet Take 5 mg by mouth 2 (two) times daily.    Marland Kitchen BIDIL 20-37.5 MG tablet TAKE 2 TABLETS BY MOUTH 3 TIMES A DAY 180 tablet 3  . carvedilol (COREG) 25 MG tablet TAKE 1 TABLET BY MOUTH TWICE A DAY WITH A MEAL 60 tablet 6  . ELIQUIS 5 MG TABS tablet TAKE 1 TABLET BY MOUTH TWICE A DAY 60 tablet 6  . KLOR-CON M20 20 MEQ tablet TAKE 1 TABLET BY MOUTH TWICE A DAY 60 tablet 3  . lisinopril (PRINIVIL,ZESTRIL) 2.5 MG tablet Take 1 tablet (2.5 mg total) by mouth daily. 30 tablet 3  . metolazone (ZAROXOLYN) 2.5 MG tablet Take 1 tablet (2.5 mg total) by mouth once. 15 tablet 3  . spironolactone (ALDACTONE) 25 MG tablet TAKE 1 TABLET (25 MG TOTAL) BY MOUTH DAILY. 30 tablet 5  . torsemide (DEMADEX) 20 MG tablet Take 60 mg (3 tablets) in the morning and 40 mg (2 tablets) in the afternoon 180 tablet 2  . colchicine 0.6 MG tablet Take 0.6 mg by mouth 2 (two) times daily as needed (gouty flare). Reported on 01/18/2016    . oxyCODONE-acetaminophen (PERCOCET) 10-325 MG tablet Take 1 tablet by mouth every 6 (six) hours as needed for pain. (Patient not taking: Reported on 01/18/2016) 90 tablet 0   No current facility-administered medications for this encounter.    Filed Vitals:   01/18/16 1001  BP: 122/68  Pulse: 92  Height: 5' 9.5" (1.765 m)  Weight: 333 lb (151.048 kg)  SpO2: 99%   Wt Readings from Last 3 Encounters:  01/18/16 333 lb (151.048 kg)  01/12/16 341 lb 14.4 oz (155.085 kg)  12/29/15 341 lb 14.4 oz (155.085 kg)      General: Well appearing, NAD.  Neck:  Thick, JVP 7-8 cm. No thyromegaly or thyroid nodule. Multiple skin tags present.  Lungs: CTAB, normal effort. CV: Nondisplaced PMI.  Heart regular S1/S2, no murmur.  No carotid bruit.   Abdomen: Obese, tight, NT, mildly distended, no HSM. No bruits or masses. +BS  Neurologic: Alert and oriented x 3.  Psych: Normal affect. Extremities: No clubbing or cyanosis. 1+ edema 1/2 to knees bilaterally.   Assessment/Plan:  1. Chronic systolic heart failure: Nonischemic cardiomyopathy, likely related to HTN.  Echo in 8/14 with EF 45% but EF down to 25-30% on TEE while in atrial flutter (03/2014).  Echo (1/16) with EF ~25% and echo in 6/16 with EF 30-35%.  No cardiac MRI done with elevated  creatinine and size.  There was concern for cardiac amyloidosis.  However, negative SPEP and abdominal fat pad biopsy negative. Low voltage on ECG may be due to obesity and not amyloidosis. Would not proceed with endomyocardial biopsy at this time. NYHA II.  - Continue torsemide 60/40 mg daily. Check BMET and Mg today. Having lots of cramping. May need to increase potassium.  - Should take metolazone 2.5 x 1 with extra 20 meq of K as needed for 3 lb weight gain, but only up to once a week.  Should call if more is required.  - Continue Coreg 25 mg BID, spironolactone 25 mg daily and Bidil 2 tabs TID.   - Continue lisinopril 2.5 daily, will not increase until we see what creatinine is running.  - Agree with fluid limit 1500 cc daily.   2. Low voltage on ECG: SPEP and fat pad biopsy negative. See above.  Consensus thus far is this is likely due to obesity rather than amyloidosis. 3. CKD III: BMET today  4. HTN: Stable on current regimen.  5. OSA: Encouraged to wear CPAP nightly. 6. Morbid Obesity: Having trouble losing weight.  - Has thought about bariatric surgery but not ready to commit.  7. Atrial flutter:  No AT/AF alerts on Corevue today.  Atypical atrial flutter in 9/15.  Remains in NSR s/p DCCV. CHADSVASC = 2 (HTN, CHF).  - Continue eliquis 5 mg BID. Denies bleeding problems.  8. Gout: Stable.    Graciella Freer, PA-C 01/18/2016   Patient seen with PA, agree with the above note.  Volume overloaded last week by symptoms and Corevue, weight down 10 lbs after 2 days of metolazone and feels better.  Would continue current medication regiment.  He can use metolazone 2.5 x 1 as needed for weight gain of 3 lbs, do not take more than once a week without calling clinic.   [name] 01/18/2016

## 2016-01-24 ENCOUNTER — Other Ambulatory Visit (HOSPITAL_COMMUNITY): Payer: Self-pay | Admitting: Internal Medicine

## 2016-01-26 ENCOUNTER — Other Ambulatory Visit: Payer: Self-pay

## 2016-01-26 DIAGNOSIS — M19079 Primary osteoarthritis, unspecified ankle and foot: Secondary | ICD-10-CM

## 2016-01-26 MED ORDER — OXYCODONE-ACETAMINOPHEN 10-325 MG PO TABS: 1.0000 | ORAL_TABLET | Freq: Four times a day (QID) | ORAL | Status: DC | PRN

## 2016-01-26 NOTE — Telephone Encounter (Signed)
Last filled 6/7 Next appt 7/26 Last uds  12/2014

## 2016-01-26 NOTE — Telephone Encounter (Signed)
Pt requesting percocet to be filled. °

## 2016-01-27 NOTE — Telephone Encounter (Signed)
Left message with mother to call us back

## 2016-01-31 ENCOUNTER — Encounter (HOSPITAL_COMMUNITY): Payer: Self-pay | Admitting: Cardiology

## 2016-02-08 ENCOUNTER — Encounter: Payer: Self-pay | Admitting: Internal Medicine

## 2016-02-10 ENCOUNTER — Telehealth (HOSPITAL_COMMUNITY): Payer: Self-pay | Admitting: Vascular Surgery

## 2016-02-10 NOTE — Telephone Encounter (Signed)
Result letter that was sent to pt was returned to office.. Return to sender Insufficent address

## 2016-02-16 ENCOUNTER — Encounter: Payer: Medicaid Other | Admitting: Internal Medicine

## 2016-02-21 ENCOUNTER — Other Ambulatory Visit: Payer: Self-pay | Admitting: Internal Medicine

## 2016-02-22 NOTE — Telephone Encounter (Signed)
Please advise 

## 2016-02-28 ENCOUNTER — Encounter (HOSPITAL_COMMUNITY): Payer: Self-pay | Admitting: Vascular Surgery

## 2016-02-28 ENCOUNTER — Emergency Department (HOSPITAL_COMMUNITY): Payer: Medicaid Other

## 2016-02-28 ENCOUNTER — Observation Stay (HOSPITAL_COMMUNITY)
Admission: EM | Admit: 2016-02-28 | Discharge: 2016-02-29 | Disposition: A | Discharge status: Home / Self Care | Payer: Medicaid Other | Attending: Student in an Organized Health Care Education/Training Program | Admitting: Student in an Organized Health Care Education/Training Program

## 2016-02-28 DIAGNOSIS — I447 Left bundle-branch block, unspecified: Secondary | ICD-10-CM | POA: Diagnosis not present

## 2016-02-28 DIAGNOSIS — Z7901 Long term (current) use of anticoagulants: Secondary | ICD-10-CM | POA: Insufficient documentation

## 2016-02-28 DIAGNOSIS — R0789 Other chest pain: Secondary | ICD-10-CM

## 2016-02-28 DIAGNOSIS — I2510 Atherosclerotic heart disease of native coronary artery without angina pectoris: Secondary | ICD-10-CM | POA: Insufficient documentation

## 2016-02-28 DIAGNOSIS — I509 Heart failure, unspecified: Secondary | ICD-10-CM

## 2016-02-28 DIAGNOSIS — M109 Gout, unspecified: Secondary | ICD-10-CM

## 2016-02-28 DIAGNOSIS — Z79899 Other long term (current) drug therapy: Secondary | ICD-10-CM | POA: Diagnosis not present

## 2016-02-28 DIAGNOSIS — I4892 Unspecified atrial flutter: Secondary | ICD-10-CM | POA: Insufficient documentation

## 2016-02-28 DIAGNOSIS — Z808 Family history of malignant neoplasm of other organs or systems: Secondary | ICD-10-CM

## 2016-02-28 DIAGNOSIS — N183 Chronic kidney disease, stage 3 (moderate): Secondary | ICD-10-CM | POA: Insufficient documentation

## 2016-02-28 DIAGNOSIS — I5023 Acute on chronic systolic (congestive) heart failure: Secondary | ICD-10-CM | POA: Diagnosis not present

## 2016-02-28 DIAGNOSIS — Z8249 Family history of ischemic heart disease and other diseases of the circulatory system: Secondary | ICD-10-CM

## 2016-02-28 DIAGNOSIS — R079 Chest pain, unspecified: Secondary | ICD-10-CM | POA: Diagnosis present

## 2016-02-28 DIAGNOSIS — I130 Hypertensive heart and chronic kidney disease with heart failure and stage 1 through stage 4 chronic kidney disease, or unspecified chronic kidney disease: Principal | ICD-10-CM | POA: Insufficient documentation

## 2016-02-28 DIAGNOSIS — I493 Ventricular premature depolarization: Secondary | ICD-10-CM

## 2016-02-28 DIAGNOSIS — Z6841 Body Mass Index (BMI) 40.0 and over, adult: Secondary | ICD-10-CM | POA: Diagnosis not present

## 2016-02-28 DIAGNOSIS — Z95810 Presence of automatic (implantable) cardiac defibrillator: Secondary | ICD-10-CM | POA: Diagnosis not present

## 2016-02-28 DIAGNOSIS — G4733 Obstructive sleep apnea (adult) (pediatric): Secondary | ICD-10-CM | POA: Diagnosis not present

## 2016-02-28 DIAGNOSIS — N179 Acute kidney failure, unspecified: Secondary | ICD-10-CM

## 2016-02-28 DIAGNOSIS — E785 Hyperlipidemia, unspecified: Secondary | ICD-10-CM | POA: Diagnosis present

## 2016-02-28 DIAGNOSIS — I428 Other cardiomyopathies: Secondary | ICD-10-CM | POA: Diagnosis not present

## 2016-02-28 DIAGNOSIS — N184 Chronic kidney disease, stage 4 (severe): Secondary | ICD-10-CM | POA: Diagnosis present

## 2016-02-28 LAB — BASIC METABOLIC PANEL
Anion gap: 11 (ref 5–15)
BUN: 43 mg/dL — AB (ref 6–20)
CHLORIDE: 97 mmol/L — AB (ref 101–111)
CO2: 30 mmol/L (ref 22–32)
Calcium: 9.2 mg/dL (ref 8.9–10.3)
Creatinine, Ser: 2.25 mg/dL — ABNORMAL HIGH (ref 0.61–1.24)
GFR calc Af Amer: 36 mL/min — ABNORMAL LOW (ref >60)
GFR calc non Af Amer: 31 mL/min — ABNORMAL LOW (ref >60)
GLUCOSE: 120 mg/dL — AB (ref 65–99)
Potassium: 3.8 mmol/L (ref 3.5–5.1)
SODIUM: 138 mmol/L (ref 135–145)

## 2016-02-28 LAB — I-STAT TROPONIN, ED
Troponin i, poc: 0.03 ng/mL (ref 0.00–0.08)
Troponin i, poc: 0.04 ng/mL (ref 0.00–0.08)

## 2016-02-28 LAB — CBC
HCT: 38.6 % — ABNORMAL LOW (ref 39.0–52.0)
Hemoglobin: 11.7 g/dL — ABNORMAL LOW (ref 13.0–17.0)
MCH: 28 pg (ref 26.0–34.0)
MCHC: 30.3 g/dL (ref 30.0–36.0)
MCV: 92.3 fL (ref 78.0–100.0)
PLATELETS: 211 10*3/uL (ref 150–400)
RBC: 4.18 MIL/uL — ABNORMAL LOW (ref 4.22–5.81)
RDW: 14.1 % (ref 11.5–15.5)
WBC: 13 10*3/uL — ABNORMAL HIGH (ref 4.0–10.5)

## 2016-02-28 LAB — BRAIN NATRIURETIC PEPTIDE: B Natriuretic Peptide: 327.5 pg/mL — ABNORMAL HIGH (ref 0.0–100.0)

## 2016-02-28 LAB — CK: CK TOTAL: 396 U/L (ref 49–397)

## 2016-02-28 MED ORDER — FUROSEMIDE 10 MG/ML IJ SOLN
120.0000 mg | Freq: Two times a day (BID) | INTRAVENOUS | Status: DC
  Administered 2016-02-28 – 2016-02-29 (×2): 120 mg via INTRAVENOUS
  Filled 2016-02-28 (×3): qty 12

## 2016-02-28 MED ORDER — ISOSORB DINITRATE-HYDRALAZINE 20-37.5 MG PO TABS
2.0000 | ORAL_TABLET | Freq: Three times a day (TID) | ORAL | Status: DC
  Administered 2016-02-28 – 2016-02-29 (×3): 2 via ORAL
  Filled 2016-02-28 (×3): qty 2

## 2016-02-28 MED ORDER — POTASSIUM CHLORIDE CRYS ER 20 MEQ PO TBCR
40.0000 meq | EXTENDED_RELEASE_TABLET | Freq: Two times a day (BID) | ORAL | Status: DC
  Administered 2016-02-28 – 2016-02-29 (×2): 40 meq via ORAL
  Filled 2016-02-28 (×2): qty 2

## 2016-02-28 MED ORDER — ACETAMINOPHEN 325 MG PO TABS: 650.0000 mg | ORAL_TABLET | ORAL | Status: DC | PRN

## 2016-02-28 MED ORDER — OXYCODONE-ACETAMINOPHEN 10-325 MG PO TABS: 1.0000 | ORAL_TABLET | Freq: Four times a day (QID) | ORAL | Status: DC | PRN

## 2016-02-28 MED ORDER — OXYCODONE-ACETAMINOPHEN 5-325 MG PO TABS
1.0000 | ORAL_TABLET | Freq: Four times a day (QID) | ORAL | Status: DC | PRN
  Administered 2016-02-29: 1 via ORAL
  Filled 2016-02-28: qty 1

## 2016-02-28 MED ORDER — ALLOPURINOL 100 MG PO TABS
100.0000 mg | ORAL_TABLET | Freq: Two times a day (BID) | ORAL | Status: DC
  Administered 2016-02-28 – 2016-02-29 (×2): 100 mg via ORAL
  Filled 2016-02-28 (×2): qty 1

## 2016-02-28 MED ORDER — SODIUM CHLORIDE 0.9% FLUSH: 3.0000 mL | INTRAVENOUS | Status: DC | PRN

## 2016-02-28 MED ORDER — OXYCODONE HCL 5 MG PO TABS
5.0000 mg | ORAL_TABLET | Freq: Four times a day (QID) | ORAL | Status: DC | PRN
  Administered 2016-02-29: 5 mg via ORAL
  Filled 2016-02-28: qty 1

## 2016-02-28 MED ORDER — SODIUM CHLORIDE 0.9 % IV SOLN: 250.0000 mL | INTRAVENOUS | Status: DC | PRN

## 2016-02-28 MED ORDER — APIXABAN 5 MG PO TABS
5.0000 mg | ORAL_TABLET | Freq: Two times a day (BID) | ORAL | Status: DC
  Administered 2016-02-28 – 2016-02-29 (×2): 5 mg via ORAL
  Filled 2016-02-28 (×2): qty 1

## 2016-02-28 MED ORDER — ONDANSETRON HCL 4 MG/2ML IJ SOLN: 4.0000 mg | Freq: Three times a day (TID) | INTRAMUSCULAR | Status: DC | PRN

## 2016-02-28 MED ORDER — ONDANSETRON HCL 4 MG/2ML IJ SOLN: 4.0000 mg | Freq: Four times a day (QID) | INTRAMUSCULAR | Status: DC | PRN

## 2016-02-28 MED ORDER — SODIUM CHLORIDE 0.9% FLUSH: 3.0000 mL | Freq: Two times a day (BID) | INTRAVENOUS | Status: DC

## 2016-02-28 MED ORDER — CARVEDILOL 25 MG PO TABS
25.0000 mg | ORAL_TABLET | Freq: Two times a day (BID) | ORAL | Status: DC
  Filled 2016-02-28: qty 1

## 2016-02-28 MED ORDER — SPIRONOLACTONE 25 MG PO TABS
25.0000 mg | ORAL_TABLET | Freq: Every day | ORAL | Status: DC
  Administered 2016-02-29: 25 mg via ORAL
  Filled 2016-02-28: qty 1

## 2016-02-28 NOTE — ED Notes (Signed)
Attempted report 

## 2016-02-28 NOTE — ED Triage Notes (Signed)
Pt reports to the ED for eval of chest tightness. Pt reports he ate approx 0.5 bag of pig skins and afterwards he was dizzy and had some chest tightness. Pt reports the next day it got better so he didn't come in but it has still not gone away so he came for evaluation. Pt also reports some SOB with exertion. Pt A&Ox4, resp e/u, and skin warm and dry.

## 2016-02-28 NOTE — ED Provider Notes (Signed)
[facility]-EMERGENCY DEPT Provider Note   CSN: [id number] Arrival date & time: 02/28/16  1216  First Provider Contact:  First MD Initiated Contact with Patient 02/28/16 1700        History   Chief Complaint Chief Complaint  Patient presents with  . Chest Pain    HPI Aws Shere is a 55 y.o. male with history of CHF, CAD who presents with chest tightness, dyspnea on exertion, and lightheadedness on standing since last Thursday. Patient states he took his medications and then he took 2 bags of "meat skin " and immediately started feeling lightheadedness upon standing. Patient then began feeling the chest tightness and has had continued symptoms since onset. Patient states he has had some increased peripheral edema bilaterally. Patient is taking his diuretic medication at home as prescribed. Patient denies any chest pain and his symptoms are not pleuritic. His report any worsening of chest tightness on exertion. Patient continues to have intermittent lightheadedness on standing, but not every time. Patient reports one episode of nausea, but no vomiting. Patient denies any chest pain, abdominal pain, vomiting, dysuria, new leg pain.  HPI  Past Medical History:  Diagnosis Date  . Anemia of chronic disease   . Arthritis   . Atrial flutter (HCC)    DCCV 9/15  . Chest pain on exertion   . Chronic systolic congestive heart failure, NYHA class 2 (HCC)    EF 20-25% 1/16  . CKD (chronic kidney disease) stage 3, GFR 30-59 ml/min   . Dyslipidemia   . Gout    Of big toe  . Hyperglycemia   . Hyperlipidemia   . Hypertension   . Morbid obesity with BMI of 45.0-49.9, adult (HCC)   . Non-ischemic cardiomyopathy (HCC)   . Organic erectile dysfunction   . OSA (obstructive sleep apnea)   . Pilonidal cyst   . Submandibular sialolithiasis    Right    Patient Active Problem List   Diagnosis Date Noted  . CHF exacerbation (HCC) 02/28/2016  . 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 CHF (congestive  heart failure), NYHA class III (HCC) 05/21/2015  . Edema, lower extremity 05/15/2015  . Acute bilateral low back pain without sciatica 05/15/2015  . Long term current use of anticoagulant therapy 04/07/2015  . Health care maintenance 03/10/2015  . Osteoarthritis 06/30/2014  . Pilonidal cyst 04/27/2014  . Acute on chronic systolic and diastolic heart failure, NYHA class 4 (HCC) 04/21/2014  . Anemia of chronic disease 04/21/2014  . Gout 04/21/2014  . Atrial flutter (HCC) 03/24/2014  . Hyperglycemia 02/06/2012  . OSA (obstructive sleep apnea) 12/11/2011  . Cardiomyopathy, nonischemic (HCC) 09/30/2010  . Chronic systolic heart failure (HCC) 09/15/2010  . Encounter for preventive health examination 09/15/2010  . ERECTILE DYSFUNCTION, ORGANIC 03/03/2010  . DYSLIPIDEMIA 07/04/2006  . Morbid obesity with BMI of 45.0-49.9, adult (HCC) 07/04/2006  . Hypertensive cardiovascular disease 05/30/2006  . CKD (chronic kidney disease) stage 3, GFR 30-59 ml/min 05/30/2006    Past Surgical History:  Procedure Laterality Date  . CARDIAC CATHETERIZATION  2012  . CARDIOVERSION N/A 04/03/2014   Procedure: CARDIOVERSION;  Surgeon: Laurey Morale, MD;  Location: [facility] ENDOSCOPY;  Service: Cardiovascular;  Laterality: N/A;  . COLONOSCOPY    . EP IMPLANTABLE DEVICE N/A 05/21/2015   Procedure: ICD Implant;  Surgeon: Duke Salvia, MD;  Location: [facility] INVASIVE CV LAB;  Service: Cardiovascular;  Laterality: N/A;  . LEFT AND RIGHT HEART CATHETERIZATION WITH CORONARY ANGIOGRAM N/A 04/24/2014   Procedure: LEFT AND RIGHT HEART  CATHETERIZATION WITH CORONARY ANGIOGRAM;  Surgeon: Laurey Morale, MD;  Location: [facility] CATH LAB;  Service: Cardiovascular;  Laterality: N/A;  . SALIVARY GLAND SURGERY  09/12/2012  . SUBMANDIBULAR GLAND EXCISION Right 09/12/2012   Procedure: Removal Right Submandibular Larina Bras;  Surgeon: Serena Colonel, MD;  Location: [facility] OR;  Service: ENT;  Laterality: Right;  . TEE WITHOUT CARDIOVERSION N/A 04/03/2014    Procedure: TRANSESOPHAGEAL ECHOCARDIOGRAM (TEE);  Surgeon: Laurey Morale, MD;  Location: [facility] ENDOSCOPY;  Service: Cardiovascular;  Laterality: N/A;       Home Medications    Prior to Admission medications   Medication Sig Start Date End Date Taking? Authorizing Provider  allopurinol (ZYLOPRIM) 100 MG tablet TAKE 1 TABLET BY MOUTH TWO TIMES DAILY 11/25/15  Yes Nischal Narendra, MD  BIDIL 20-37.5 MG tablet TAKE 2 TABLETS BY MOUTH 3 TIMES A DAY 09/27/15  Yes Laurey Morale, MD  carvedilol (COREG) 25 MG tablet TAKE 1 TABLET BY MOUTH TWICE A DAY WITH A MEAL 11/11/15  Yes Dolores Patty, MD  colchicine 0.6 MG tablet Take 0.6 mg by mouth 2 (two) times daily as needed (gouty flare). Reported on 01/18/2016   Yes Historical Provider, MD  ELIQUIS 5 MG TABS tablet TAKE 1 TABLET BY MOUTH TWICE A DAY 10/13/15  Yes Bevelyn Buckles Bensimhon, MD  KLOR-CON M20 20 MEQ tablet TAKE 1 TABLET BY MOUTH TWICE A DAY 01/26/16  Yes Bevelyn Buckles Bensimhon, MD  lisinopril (PRINIVIL,ZESTRIL) 2.5 MG tablet Take 1 tablet (2.5 mg total) by mouth daily. 10/11/15  Yes Laurey Morale, MD  metolazone (ZAROXOLYN) 2.5 MG tablet Take 1 tab( 2.5 mg) once weekly AS NEEDED for weight gain 3 lbs or more. 01/18/16  Yes Laurey Morale, MD  oxyCODONE-acetaminophen (PERCOCET) 10-325 MG tablet Take 1 tablet by mouth every 6 (six) hours as needed for pain. 01/26/16  Yes Darreld Mclean, MD  spironolactone (ALDACTONE) 25 MG tablet TAKE 1 TABLET (25 MG TOTAL) BY MOUTH DAILY. 02/23/16  Yes Dolores Patty, MD  torsemide (DEMADEX) 20 MG tablet Take 60 mg (3 tablets) in the morning and 40 mg (2 tablets) in the afternoon 11/18/15  Yes Laurey Morale, MD    Family History Family History  Problem Relation Age of Onset  . Hypertension Mother   . Hypertension Father   . Cancer Father     brother died of brain cancer; sister died of bone cancer  . Diabetes Sister   . Diabetes Brother   . Colon cancer Maternal Aunt   . Cardiomyopathy Neg Hx     Social  History Social History  Substance Use Topics  . Smoking status: Never Smoker  . Smokeless tobacco: Never Used  . Alcohol use No     Allergies   Beta adrenergic blockers   Review of Systems Review of Systems  Constitutional: Negative for chills and fever.  HENT: Negative for facial swelling and sore throat.   Respiratory: Positive for chest tightness and shortness of breath.   Cardiovascular: Positive for leg swelling. Negative for chest pain.  Gastrointestinal: Negative for abdominal pain, nausea and vomiting.  Genitourinary: Negative for dysuria.  Musculoskeletal: Negative for back pain.  Skin: Negative for rash and wound.  Neurological: Positive for light-headedness. Negative for headaches.  Psychiatric/Behavioral: The patient is not nervous/anxious.      Physical Exam Updated Vital Signs BP 123/87 (BP Location: Right Arm)   Pulse 76   Temp 98.6 F (37 C) (Oral)   Resp 14   Ht  5' 9.5" (1.765 m)   Wt (!) 153.3 kg   SpO2 97%   BMI 49.19 kg/m   Physical Exam  Constitutional: He appears well-developed and well-nourished. No distress.  HENT:  Head: Normocephalic and atraumatic.  Mouth/Throat: Oropharynx is clear and moist. No oropharyngeal exudate.  Eyes: Conjunctivae are normal. Pupils are equal, round, and reactive to light. Right eye exhibits no discharge. Left eye exhibits no discharge. No scleral icterus.  Neck: Normal range of motion. Neck supple. No thyromegaly present.  Cardiovascular: Normal rate, regular rhythm and normal heart sounds.  Exam reveals no gallop and no friction rub.   No murmur heard. Pulmonary/Chest: Effort normal and breath sounds normal. No stridor. No respiratory distress. He has no wheezes. He has no rales.  Abdominal: Soft. Bowel sounds are normal. He exhibits no distension. There is no tenderness. There is no rebound and no guarding.  Musculoskeletal: He exhibits edema (bilateral LE, 1+ pitting).  No calf TTP bilaterally   Lymphadenopathy:    He has no cervical adenopathy.  Neurological: He is alert. Coordination normal.  Skin: Skin is warm and dry. No rash noted. He is not diaphoretic. No pallor.  Psychiatric: He has a normal mood and affect.  Nursing note and vitals reviewed.    ED Treatments / Results  Labs (all labs ordered are listed, but only abnormal results are displayed) Labs Reviewed  BASIC METABOLIC PANEL - Abnormal; Notable for the following:       Result Value   Chloride 97 (*)    Glucose, Bld 120 (*)    BUN 43 (*)    Creatinine, Ser 2.25 (*)    GFR calc non Af Amer 31 (*)    GFR calc Af Amer 36 (*)    All other components within normal limits  CBC - Abnormal; Notable for the following:    WBC 13.0 (*)    RBC 4.18 (*)    Hemoglobin 11.7 (*)    HCT 38.6 (*)    All other components within normal limits  BRAIN NATRIURETIC PEPTIDE - Abnormal; Notable for the following:    B Natriuretic Peptide 327.5 (*)    All other components within normal limits  I-STAT TROPOININ, ED  I-STAT TROPOININ, ED    EKG  EKG Interpretation  Date/Time:  Monday February 28 2016 12:19:06 EDT Ventricular Rate:  89 PR Interval:    QRS Duration: 100 QT Interval:  396 QTC Calculation: 481 R Axis:   -128 Text Interpretation:  Atrial flutter with variable A-V block with premature ventricular or aberrantly conducted complexes Low voltage QRS Possible Anterolateral infarct , age undetermined Abnormal ECG No STEMI.  Confirmed by LONG MD, JOSHUA [phone]) on 02/28/2016 5:06:16 PM       Radiology Dg Chest 2 View  Result Date: 02/28/2016 CLINICAL DATA:  Shortness of breath for 5 days. EXAM: CHEST  2 VIEW COMPARISON:  07/24/2015 FINDINGS: Asymmetric elevation right hemidiaphragm is stable. The cardio pericardial silhouette is enlarged. There is pulmonary vascular congestion without overt pulmonary edema. Left pacer/AICD remains in place. The visualized bony structures of the thorax are intact. IMPRESSION: stable.  Cardiomegaly with vascular congestion. No acute cardiopulmonary findings. Electronically Signed   By: Kennith Center M.D.   On: 02/28/2016 13:18    Procedures Procedures (including critical care time)  Medications Ordered in ED Medications - No data to display   Initial Impression / Assessment and Plan / ED Course  I have reviewed the triage vital signs and the nursing notes.  Pertinent labs & imaging results that were available during my care of the patient were reviewed by me and considered in my medical decision making (see chart for details).  Clinical Course     Final Clinical Impressions(s) / ED Diagnoses   Final diagnoses:  Acute on chronic congestive heart failure, unspecified congestive heart failure type [facility])    Patient clinically volume overloaded. BC shows WBC 13, hemoglobin 11.7. BMP shows chloride 97, glucose 120, BUN 43, creatinine 2.25 (elevation in baseline BUN/creatinine). Delta troponin negative. BNP 327.5, which is elevated from patient's baseline. CXR shows stable cardiomegaly with vascular congestion; no acute cardiopulmonary findings. EKG shows trial flutter with variable A-V block with premature ventricular or aberrantly conducted complexes. Patient also evaluated by Dr. Jacqulyn Bath. He agreed that the patient should be admitted for inpatient diuresis as outpatient diuresis is risky considering patient's current decreasing kidney function. I consulted internal medicine teaching service who will admit the patient. Transfer of care to attending Dr. Erlinda Hong for further evaluation and treatment. Patient vitals stable throughout ED course.  New Prescriptions New Prescriptions   No medications on file     Emi Holes, Cordelia Poche 02/28/16 1951    Maia Plan, MD 02/29/16 1005

## 2016-02-28 NOTE — H&P (Signed)
Date: 02/28/2016               Patient Name:  Eric Murillo MRN: [id number]  DOB: 1960/10/22 Age / Sex: 55 y.o., male   PCP: Darreld Mclean, MD         Medical Service: Internal Medicine Teaching Service         Attending Physician: Dr. Tyson Alias, MD    First Contact: Randa Lynn, MS4 Pager: [phone]  Second Contact: Dr. Hyacinth Meeker, MD Pager: [phone]       After Hours (After 5p/  First Contact Pager: [phone]  weekends / holidays): Second Contact Pager: [phone]   Chief Complaint: chest tightness and shortness of breath  History of Present Illness: [name] is a very pleasant 55 year old male with MHx significant for CHF with ICD placement, atrial flutter, HTN, HLD and morbid obesity who presents to the hospital for evaluation of chest tightness and shortness of breath. The patient reports his symptoms began Thursday shortly after taking his medications then eating 2 large bags of "spicy fried meat skins." He reports that he has had them before in lesser quantities without problem. The patient reports chest tightness and shortness of breath when walking. He also reported that the first few days after eating them he felt severely dizzy and lightheaded which is still present but improved. Pt reports is exacerbated by standing up or leaning over. He reports some nausea, improved at this time, also reports his leg swelling has worsened as well.  He denies any chest pain, fever, chills, productive cough, abdominal pain, dysuria, hematuria, headache.   Meds:  Current Meds  Medication Sig  . allopurinol (ZYLOPRIM) 100 MG tablet TAKE 1 TABLET BY MOUTH TWO TIMES DAILY  . BIDIL 20-37.5 MG tablet TAKE 2 TABLETS BY MOUTH 3 TIMES A DAY  . carvedilol (COREG) 25 MG tablet TAKE 1 TABLET BY MOUTH TWICE A DAY WITH A MEAL  . colchicine 0.6 MG tablet Take 0.6 mg by mouth 2 (two) times daily as needed (gouty flare). Reported on 01/18/2016  . ELIQUIS 5 MG TABS tablet TAKE 1 TABLET  BY MOUTH TWICE A DAY  . KLOR-CON M20 20 MEQ tablet TAKE 1 TABLET BY MOUTH TWICE A DAY  . lisinopril (PRINIVIL,ZESTRIL) 2.5 MG tablet Take 1 tablet (2.5 mg total) by mouth daily.  . metolazone (ZAROXOLYN) 2.5 MG tablet Take 1 tab( 2.5 mg) once weekly AS NEEDED for weight gain 3 lbs or more.  Marland Kitchen oxyCODONE-acetaminophen (PERCOCET) 10-325 MG tablet Take 1 tablet by mouth every 6 (six) hours as needed for pain.  Marland Kitchen spironolactone (ALDACTONE) 25 MG tablet TAKE 1 TABLET (25 MG TOTAL) BY MOUTH DAILY.  Marland Kitchen torsemide (DEMADEX) 20 MG tablet Take 60 mg (3 tablets) in the morning and 40 mg (2 tablets) in the afternoon     Allergies: Allergies as of 02/28/2016 - Review Complete 02/28/2016  Allergen Reaction Noted  . Beta adrenergic blockers Other (See Comments) 10/19/2008   Past Medical History:  Diagnosis Date  . Anemia of chronic disease   . Arthritis   . Atrial flutter (HCC)    DCCV 9/15  . Chest pain on exertion   . Chronic systolic congestive heart failure, NYHA class 2 (HCC)    EF 20-25% 1/16  . CKD (chronic kidney disease) stage 3, GFR 30-59 ml/min   . Dyslipidemia   . Gout    Of big toe  . Hyperglycemia   . Hyperlipidemia   .  Hypertension   . Morbid obesity with BMI of 45.0-49.9, adult (HCC)   . Non-ischemic cardiomyopathy (HCC)   . Organic erectile dysfunction   . OSA (obstructive sleep apnea)   . Pilonidal cyst   . Submandibular sialolithiasis    Right    Family History:  Father: HTN, unspecified cancer Mother: HTN Sister: bone cancer Brother: brain cancer  Social History: Tobacco: never used Alcohol: none Recreational drug use: none  Review of Systems: A complete ROS was negative except as per HPI.  Physical Exam: Blood pressure 118/74, pulse 76, temperature 98.6 F (37 C), temperature source Oral, resp. rate 14, height 5' 9.5" (1.765 m), weight (!) 153.3 kg (337 lb 15.4 oz), SpO2 97 %. General: Alert, obese middle-aged male seated comfortably in bed. In no acute  distress.  Cardiovascular: Regular rate and rhythm Pulmonary: CTA BL, no wheezing or rales  Abdomen: +bowel sounds. Distended abdomen, patient reports increased abdominal girth. No rebound or tenderness.  Extremities: 1+ pedal edema BL, no erythema   EKG:   EKG Interpretation  Date/Time:  Monday February 28 2016 12:19:06 EDT Ventricular Rate:  89 PR Interval:    QRS Duration: 100 QT Interval:  396 QTC Calculation: 481 R Axis:   -128 Text Interpretation:  Atrial flutter with variable A-V block with premature ventricular or aberrantly conducted complexes Low voltage QRS Possible Anterolateral infarct , age undetermined Abnormal ECG No STEMI.  Confirmed by LONG MD, JOSHUA [phone]) on 02/28/2016 5:06:16 PM       2-view CXR: Showed stable cardiomegaly with vascular congestion. No acute cardiopulmonary process  Assessment & Plan by Problem: Principal Problem:   CHF exacerbation (HCC) Active Problems:   Hyperlipidemia   Morbid obesity with BMI of 45.0-49.9, adult (HCC)   CKD (chronic kidney disease) stage 3, GFR 30-59 ml/min   Cardiomyopathy, nonischemic (HCC)   OSA (obstructive sleep apnea)   Atrial flutter (HCC)   Gout  1. CHF exacerbation Troponin's negative x2. BNP 327.  Exacerbation likely brought on due to patients excessive sodium intake last week. The patient exhibits signs of fluid overload including shortness of breath, worsening pedal edema and abdominal distension.  Patients home dose of PO Lasix is  qAM and  qPM. Patient started on IV Lasix,  BID. Also given K-dur BID for electrolyte replacement. Potassium at 3.8.  Strict I&O's with daily weights. On monitor. Patient continued on home medications of Bidil, Spironolactone and coreg  .   2. Acute on Chronic Renal Failure, Stage 3 CKD Cr 2.25, up from patients baseline ~1.8. AKI likely due to patients CHF exacerbation causing poor perfusion. Lisinopril held. Will monitor fluid balance.  3.  Cardiomyopathy, non-ischemic Cath done 3/12 for evaluation of EF 30-35%, without evidence of ischemia.  Last ECHO 6/16 showed EF of 30-35%.  There was past concern for amyloidosis/infiltrative cardiomyopathy however fat pad biopsy did not demonstrate evidence for this.  4. Atrial flutter:  Patient in atrial flutter. On Eliquis.   5. Gout Home Allopurinol continued.  Dispo: Admit patient to Observation with expected length of stay less than 2 midnights.  SignedNoemi Chapel, DO 02/28/2016, 8:07 PM  Pager: [phone]

## 2016-02-28 NOTE — ED Notes (Signed)
Main lab will add on BNP 

## 2016-02-29 ENCOUNTER — Ambulatory Visit (INDEPENDENT_AMBULATORY_CARE_PROVIDER_SITE_OTHER): Payer: Medicaid Other

## 2016-02-29 ENCOUNTER — Ambulatory Visit (INDEPENDENT_AMBULATORY_CARE_PROVIDER_SITE_OTHER): Payer: Medicaid Other | Admitting: *Deleted

## 2016-02-29 ENCOUNTER — Telehealth: Payer: Self-pay | Admitting: Cardiology

## 2016-02-29 DIAGNOSIS — Z95810 Presence of automatic (implantable) cardiac defibrillator: Secondary | ICD-10-CM

## 2016-02-29 DIAGNOSIS — I429 Cardiomyopathy, unspecified: Secondary | ICD-10-CM | POA: Diagnosis not present

## 2016-02-29 DIAGNOSIS — I5022 Chronic systolic (congestive) heart failure: Secondary | ICD-10-CM | POA: Diagnosis not present

## 2016-02-29 DIAGNOSIS — I5043 Acute on chronic combined systolic (congestive) and diastolic (congestive) heart failure: Secondary | ICD-10-CM | POA: Diagnosis not present

## 2016-02-29 DIAGNOSIS — I428 Other cardiomyopathies: Secondary | ICD-10-CM

## 2016-02-29 LAB — BASIC METABOLIC PANEL
ANION GAP: 7 (ref 5–15)
BUN: 37 mg/dL — AB (ref 6–20)
CHLORIDE: 98 mmol/L — AB (ref 101–111)
CO2: 35 mmol/L — ABNORMAL HIGH (ref 22–32)
Calcium: 9.4 mg/dL (ref 8.9–10.3)
Creatinine, Ser: 1.97 mg/dL — ABNORMAL HIGH (ref 0.61–1.24)
GFR calc Af Amer: 42 mL/min — ABNORMAL LOW (ref >60)
GFR calc non Af Amer: 36 mL/min — ABNORMAL LOW (ref >60)
GLUCOSE: 110 mg/dL — AB (ref 65–99)
POTASSIUM: 3.9 mmol/L (ref 3.5–5.1)
Sodium: 140 mmol/L (ref 135–145)

## 2016-02-29 MED ORDER — TORSEMIDE 20 MG PO TABS: 60.0000 mg | ORAL_TABLET | Freq: Two times a day (BID) | ORAL | Status: DC

## 2016-02-29 NOTE — Progress Notes (Signed)
   Subjective: Patient states he feels better today. No complaints.  Objective:  Vital signs in last 24 hours: Vitals:   02/28/16 1945 02/28/16 2000 02/29/16 0026 02/29/16 0455  BP: 118/74 110/65 125/69 115/75  Pulse: 76 79 78 76  Resp:  18 18 18   Temp:  99.4 F (37.4 C) 97.8 F (36.6 C) 98.1 F (36.7 C)  TempSrc:  Oral Oral Oral  SpO2:  97% 98% 100%  Weight:    (!) 151.3 kg (333 lb 8 oz)  Height:       Constitutional: NAD, patient sitting up in bed CV: RRR, without murmurs, rubs or gallops Resp: CTAB, no increased work of breathing, no wheezing or crackles Abd: Obese, soft, non tender Ext: Warm, 2+ pulses throughout, mild pitting edema  Assessment/Plan:  Principal Problem:   Acute on chronic congestive heart failure (HCC) Active Problems:   Hyperlipidemia   Morbid obesity with BMI of 45.0-49.9, adult (HCC)   CKD (chronic kidney disease) stage 3, GFR 30-59 ml/min   Cardiomyopathy, nonischemic (HCC)   OSA (obstructive sleep apnea)   Atrial flutter (HCC)   Gout  Acute on Chronic CHF with reduced EF: Patient presented with chest tightness and shortness of breath with exertion for 5 days; attributes this to overindulging in 2 bags of pork rinds. Troponin's negative x2. BNP 327. EKG showed trial flutter with variable A-V block with premature ventricular complexes. CXR was negative for pleural effusion or consolidation. Mildly volume overloaded today. Patient's estimated dry weight is around 325lbs though he has had multiple fluctuations in recent visits. Today's weight is 333lbs. At home is is on Bidil 20-37.5mg  2 tabs TID, spironolactone 25mg  daily, torsemide 60mg  AM and 40mg  PM, and coreg 25mg  bid. Given K-dur BID for electrolyte replacement. Potassium at 3.9.  --IV Lasix, 125mg  BID.  --Strict I&O's with daily weights --telemetry  --continue home medication of  Bidil 20-37.5mg  2 tabs TID, spironolactone 25mg  daily, and coreg 25mg  bid  Chest tightness: Troponins were  negative x2, EKG is negative for new ischemic process, CXR was negative for pleural effusion or consolidation. Will monitor and assess whether he needs an outpatient stress test for ischemic evaluation. Last ECHO 12/2014 showed 30-35% EF, stress test 08/2014 showed nonischemic cardiomyopathy.  Atrial Flutter: Patient in atrial flutter, on Eliquis --Continue Eliquis 5mg  bid  Gout: Patient with a history of gout on chronic allopurinol therapy. --Continue home allopurinol 100mg  daily  Dispo: Anticipated discharge in approximately 1-2 day(s).   Nyra Market, MD 02/29/2016, 1:38 PM Pager: [phone]

## 2016-02-29 NOTE — Discharge Summary (Signed)
Name: Diar Berkel MRN: [id number] DOB: 03-29-1961 55 y.o. PCP: Zada Finders, MD  Date of Admission: 02/28/2016  4:31 PM Date of Discharge: 03/02/2016 Attending Physician: No att. providers found  Discharge Diagnosis: 1. Acute on Chronic Congestive Heart Failure Principal Problem:   Acute on chronic congestive heart failure (HCC) Active Problems:   Hyperlipidemia   Morbid obesity with BMI of 45.0-49.9, adult (HCC)   CKD (chronic kidney disease) stage 3, GFR 30-59 ml/min   Cardiomyopathy, nonischemic (HCC)   OSA (obstructive sleep apnea)   Atrial flutter (HCC)   Gout   Discharge Medications:   Medication List    TAKE these medications   allopurinol 100 MG tablet Commonly known as:  ZYLOPRIM TAKE 1 TABLET BY MOUTH TWO TIMES DAILY   BIDIL 20-37.5 MG tablet Generic drug:  isosorbide-hydrALAZINE TAKE 2 TABLETS BY MOUTH 3 TIMES A DAY   carvedilol 25 MG tablet Commonly known as:  COREG TAKE 1 TABLET BY MOUTH TWICE A DAY WITH A MEAL   colchicine 0.6 MG tablet Take 0.6 mg by mouth 2 (two) times daily as needed (gouty flare). Reported on 01/18/2016   ELIQUIS 5 MG Tabs tablet Generic drug:  apixaban TAKE 1 TABLET BY MOUTH TWICE A DAY   KLOR-CON M20 20 MEQ tablet Generic drug:  potassium chloride SA TAKE 1 TABLET BY MOUTH TWICE A DAY   lisinopril 2.5 MG tablet Commonly known as:  PRINIVIL,ZESTRIL Take 1 tablet (2.5 mg total) by mouth daily.   metolazone 2.5 MG tablet Commonly known as:  ZAROXOLYN Take 1 tab( 2.5 mg) once weekly AS NEEDED for weight gain 3 lbs or more.   oxyCODONE-acetaminophen 10-325 MG tablet Commonly known as:  PERCOCET Take 1 tablet by mouth every 6 (six) hours as needed for pain.   spironolactone 25 MG tablet Commonly known as:  ALDACTONE TAKE 1 TABLET (25 MG TOTAL) BY MOUTH DAILY.       Disposition and follow-up:   Mr.Georgi Vanover was discharged from [facility] in Stable condition.  At the hospital follow up visit  please address:  1.   -weight, d/c weight 333lbs, from chart, dry weight seems to be around 325 -shortness of breath on exertion -adherence to low sodium diet and fluid restriction -improving kidney function (Cr 2.25>1.97, baseline ~1.8)  2.  Labs / imaging needed at time of follow-up: BMP (Cr, K)  3.  Pending labs/ test needing follow-up: None  Follow-up Appointments: Follow-up Information    [place]. Go on 03/08/2016.   Why:  Go to your regular appointment with Dr. Posey Pronto on Wednesday Aug 16th at 2:15 pm. If you start feeling short of breath again despite your medications, call the clinic. Contact information: [address] [place] Hospital Course by problem list: Principal Problem:   Acute on chronic congestive heart failure ([place]) Active Problems:   Hyperlipidemia   Morbid obesity with BMI of 45.0-49.9, adult (HCC)   CKD (chronic kidney disease) stage 3, GFR 30-59 ml/min   Cardiomyopathy, nonischemic (HCC)   OSA (obstructive sleep apnea)   Atrial flutter (HCC)   Gout   Acute on Chronic CHF with reduced EF: Patient presented with chest tightness and shortness of breath with exertion for 5 days; attributes this to overindulging in 2 bags of pork rinds. Troponin's negative x2; BNP 327; EKG showed trial flutter with variable A-V block with premature ventricular complexes; CXR was  negative for pleural effusion or consolidation. Patient's estimated dry weight is around 325lbs though he has had multiple fluctuations in recent visits. Weight at admission was 338 lbs and at discharge is 333lbs. At home is on Bidil 20-37.22m 2 tabs TID, spironolactone 232mdaily, torsemide 6030mM and 63m78m, and coreg 25mg90m. He was given K-dur 63mEq32m for electrolyte replacement, potassium at 3.9 at discharge. He was treated with IV lasix 120mg a29mesponded well with resolution of symptoms of shortness of breath and  chest pressure and a drop in weight of 5lbs. Patient was discharged on him home medications.  Chest tightness: Troponins were negative x2, EKG is negative for new ischemic process, CXR was negative for pleural effusion or consolidation. No need for ischemic workup at this time. Last ECHO 12/2014 showed 30-35% EF, stress test 08/2014 showed nonischemic cardiomyopathy. Symptoms resolved with diuresis.   Atrial Flutter: Patient has a flutter with ICD in place since 04/2015 on Eliquis 5mg dai24m he was continued on his home dose of Eliquis.  Gout: Patient with a history of gout and was continued on his home dose of Allopurinol 100mg twi20m day.  Discharge Vitals:   BP 113/70 (BP Location: Left Arm)   Pulse 76   Temp 98.2 F (36.8 C) (Oral)   Resp 18   Ht 5' 9.5" (1.765 m)   Wt (!) 151.3 kg (333 lb 9.6 oz)   SpO2 97%   BMI 48.56 kg/m   Pertinent Labs, Studies, and Procedures:  Results for Cardell, ADOLPHO, [id number]/04/2016 15:10  Ref. Range 02/29/2016 03:32  Sodium Latest Ref Range: 135 - 145 mmol/L 140  Potassium Latest Ref Range: 3.5 - 5.1 mmol/L 3.9  Chloride Latest Ref Range: 101 - 111 mmol/L 98 (L)  CO2 Latest Ref Range: 22 - 32 mmol/L 35 (H)  BUN Latest Ref Range: 6 - 20 mg/dL 37 (H)  Creatinine Latest Ref Range: 0.61 - 1.24 mg/dL 1.97 (H)  Calcium Latest Ref Range: 8.9 - 10.3 mg/dL 9.4  EGFR (Non-African Amer.) Latest Ref Range: >60 mL/min 36 (L)  EGFR (African American) Latest Ref Range: >60 mL/min 42 (L)  Glucose Latest Ref Range: 65 - 99 mg/dL 110 (H)  Anion gap Latest Ref Range: 5 - 15  7     Ref. Range 02/28/2016 12:56 02/28/2016 17:38  Troponin i, poc Latest Ref Range: 0.00 - 0.08 ng/mL 0.03 0.04   Chest Xray 02/28/2016: Stable. Cardiomegaly with vascular congestion. No acute cardiopulmonary findings.  Discharge Instructions: Discharge Instructions    Diet - low sodium heart healthy    Complete by:  As directed   Discharge instructions    Complete by:  As  directed   Mr. Alessandrini, Pollardre in the hospital for fluid overload which we treated with medicine that is similar to the ones you are taking at home. At discharge, don't take your lisinopril until you've been seen in clinic, but we want you to continue taking your other home medications as you had before coming to the hospital.   If you have any more problems with shortness of breath or chest pain, please call the Internal Medicine Center.   [place]. Again, if you start feeling bad again, give us a callKoreand we will see you sooner.    Please limit your fluid intake to about 50 fluid ounces a day, and  your sodium intake to about 1565m a day. I've included information about how much sodium is in common foods. Please do your best to keep to these guidelines so that we can better control your heart failure and fluid overload.   Increase activity slowly    Complete by:  As directed      Signed: GAlphonzo Grieve MD 03/02/2016, 2:59 PM   Pager: [phone]

## 2016-02-29 NOTE — Progress Notes (Signed)
Received from ED, alert and oriented x 4, no complaints of short of breath and chest pain.  Oriented patient to unit routines and initial plan of care.  Will continue to monitor and evaluate.

## 2016-02-29 NOTE — Care Management Note (Signed)
Case Management Note  Patient Details  Name: Eric Murillo MRN: [id number] Date of Birth: 05-23-1961  Subjective/Objective:     Admitted to Observation for CHF               Action/Plan: Patient lives at home with his mother. PCP is Darreld Mclean, MD; private insurance with Medicaid with prescription drug coverage; pharmacy of choice is [facility], pt reports no problem getting his medication. He has scales at home and weighs himself daily. He also cooks a low sodium diet. CM will continue to follow for DCP. Expected Discharge Date:    possible 8/10/2-17              Expected Discharge Plan:  Home/Self Care  Discharge planning Services  CM Consult    Status of Service:  In process, will continue to follow  Reola Mosher [phone] 02/29/2016, 1:59 PM

## 2016-02-29 NOTE — Telephone Encounter (Signed)
Confirmed remote transmission w/ pt sister.   

## 2016-02-29 NOTE — Progress Notes (Signed)
Pt has orders to be discharged. Discharge instructions given and pt has no additional questions at this time. Medication regimen reviewed and pt educated. Pt verbalized understanding and has no additional questions. Telemetry box removed. IV removed and site in good condition. Pt stable and waiting for transportation.   Finnian Husted RN 

## 2016-03-01 ENCOUNTER — Other Ambulatory Visit (HOSPITAL_COMMUNITY): Payer: Self-pay | Admitting: Cardiology

## 2016-03-01 ENCOUNTER — Encounter: Payer: Self-pay | Admitting: Cardiology

## 2016-03-01 ENCOUNTER — Telehealth: Payer: Self-pay

## 2016-03-01 NOTE — Progress Notes (Signed)
EPIC Encounter for ICM Monitoring  Patient Name: Eric Murillo is a 55 y.o. male Date: 03/01/2016 Primary Care Physican: Darreld Mclean, MD Primary Cardiologist: Shirlee Latch Electrophysiologist: Graciela Husbands Dry Weight: unknown      Attempted patient call and unable to reach.  Transmission reviewed.   Patient went to ER 02/28/2016 and per discharge note: Patient presented with chest tightness and shortness of breath with exertion for 5 days; attributes this to overindulging in 2 bags of pork rinds. He was mildly volume overloaded at time of visit.  Thoracic impedance normal.   LABS: 02/29/2016 Creatinine 1.97, BUN 37, Potassium 3.9, Sodium 140       02/28/2016 Creatinine 2.25, BUN 43, Potassium 3.8, Sodium 138, BNP 327.5  01/18/2016 Creatinine 1.84, BUN 33, Potassium 4.2, Sodium 138 01/12/2016 Creatinine 1.87, BUN 22, Potassium 4.3, Sodium 146 12/29/2015 Creatinine 1.67, BUN 20, Potassium 4.5, Sodium 142 10/22/2015 Creatinine 1.84, BUN 16, Potassium 4.2, Sodium 141 08/04/2015 Creatinine 1.66, BUN 21, Potassium 4.2, Sodium 143   ICM trend: 02/29/2016     Follow-up plan: ICM clinic phone appointment on 04/04/2016.  HF clinic appointment on 03/20/2016  Copy of ICM check sent to primary cardiologist and device physician.   Karie Soda, RN 03/01/2016 8:09 AM

## 2016-03-01 NOTE — Telephone Encounter (Signed)
Patient left message was returning call.

## 2016-03-01 NOTE — Progress Notes (Signed)
Remote ICD transmission.   

## 2016-03-01 NOTE — Telephone Encounter (Signed)
Attempted call back to patient and no answer.  

## 2016-03-01 NOTE — Telephone Encounter (Signed)
Remote ICM transmission received.  Attempted patient call and left message with person answering phone.

## 2016-03-03 NOTE — Progress Notes (Signed)
Received incoming call from patient.  He reported feeling better since recent hospitalization.  He reported eating 2 bags of pork rinds and now knows how important it is to check food labels for sodium amounts.  Advised to check all food labels so he can limit to 2000 mg daily.  He reported this episode caused more kidney damage and will be much more careful about his food intake.  Transmission reviewed and next ICM transmission 04/04/2016 and HF clinic appointment on 03/21/2016.  Advised to call for any fluid symptoms.

## 2016-03-05 ENCOUNTER — Encounter (HOSPITAL_COMMUNITY): Payer: Self-pay | Admitting: Emergency Medicine

## 2016-03-05 ENCOUNTER — Emergency Department (HOSPITAL_COMMUNITY): Payer: Medicaid Other

## 2016-03-05 ENCOUNTER — Inpatient Hospital Stay (HOSPITAL_COMMUNITY)
Admission: EM | Admit: 2016-03-05 | Discharge: 2016-03-10 | DRG: 291 | Disposition: A | Discharge status: Home / Self Care | Payer: Medicaid Other | Attending: Internal Medicine | Admitting: Internal Medicine

## 2016-03-05 DIAGNOSIS — R739 Hyperglycemia, unspecified: Secondary | ICD-10-CM | POA: Diagnosis present

## 2016-03-05 DIAGNOSIS — E785 Hyperlipidemia, unspecified: Secondary | ICD-10-CM | POA: Diagnosis present

## 2016-03-05 DIAGNOSIS — I484 Atypical atrial flutter: Secondary | ICD-10-CM | POA: Diagnosis present

## 2016-03-05 DIAGNOSIS — N183 Chronic kidney disease, stage 3 (moderate): Secondary | ICD-10-CM

## 2016-03-05 DIAGNOSIS — I428 Other cardiomyopathies: Secondary | ICD-10-CM | POA: Diagnosis present

## 2016-03-05 DIAGNOSIS — Z9119 Patient's noncompliance with other medical treatment and regimen: Secondary | ICD-10-CM

## 2016-03-05 DIAGNOSIS — I4892 Unspecified atrial flutter: Secondary | ICD-10-CM | POA: Diagnosis not present

## 2016-03-05 DIAGNOSIS — Z95810 Presence of automatic (implantable) cardiac defibrillator: Secondary | ICD-10-CM | POA: Diagnosis not present

## 2016-03-05 DIAGNOSIS — N184 Chronic kidney disease, stage 4 (severe): Secondary | ICD-10-CM | POA: Diagnosis present

## 2016-03-05 DIAGNOSIS — E8770 Fluid overload, unspecified: Secondary | ICD-10-CM | POA: Diagnosis present

## 2016-03-05 DIAGNOSIS — E662 Morbid (severe) obesity with alveolar hypoventilation: Secondary | ICD-10-CM | POA: Diagnosis present

## 2016-03-05 DIAGNOSIS — Z6841 Body Mass Index (BMI) 40.0 and over, adult: Secondary | ICD-10-CM

## 2016-03-05 DIAGNOSIS — I5023 Acute on chronic systolic (congestive) heart failure: Secondary | ICD-10-CM | POA: Diagnosis present

## 2016-03-05 DIAGNOSIS — D638 Anemia in other chronic diseases classified elsewhere: Secondary | ICD-10-CM | POA: Diagnosis present

## 2016-03-05 DIAGNOSIS — M1000 Idiopathic gout, unspecified site: Secondary | ICD-10-CM

## 2016-03-05 DIAGNOSIS — M109 Gout, unspecified: Secondary | ICD-10-CM | POA: Diagnosis present

## 2016-03-05 DIAGNOSIS — G4733 Obstructive sleep apnea (adult) (pediatric): Secondary | ICD-10-CM | POA: Diagnosis present

## 2016-03-05 DIAGNOSIS — Z7901 Long term (current) use of anticoagulants: Secondary | ICD-10-CM

## 2016-03-05 DIAGNOSIS — D631 Anemia in chronic kidney disease: Secondary | ICD-10-CM

## 2016-03-05 DIAGNOSIS — R9431 Abnormal electrocardiogram [ECG] [EKG]: Secondary | ICD-10-CM | POA: Diagnosis not present

## 2016-03-05 DIAGNOSIS — I130 Hypertensive heart and chronic kidney disease with heart failure and stage 1 through stage 4 chronic kidney disease, or unspecified chronic kidney disease: Principal | ICD-10-CM | POA: Diagnosis present

## 2016-03-05 DIAGNOSIS — R079 Chest pain, unspecified: Secondary | ICD-10-CM | POA: Diagnosis present

## 2016-03-05 DIAGNOSIS — N179 Acute kidney failure, unspecified: Secondary | ICD-10-CM | POA: Diagnosis present

## 2016-03-05 DIAGNOSIS — I509 Heart failure, unspecified: Secondary | ICD-10-CM | POA: Diagnosis not present

## 2016-03-05 HISTORY — DX: Presence of automatic (implantable) cardiac defibrillator: Z95.810

## 2016-03-05 LAB — URINALYSIS, ROUTINE W REFLEX MICROSCOPIC
Bilirubin Urine: NEGATIVE
GLUCOSE, UA: NEGATIVE mg/dL
HGB URINE DIPSTICK: NEGATIVE
Ketones, ur: NEGATIVE mg/dL
LEUKOCYTES UA: NEGATIVE
Nitrite: NEGATIVE
PROTEIN: 100 mg/dL — AB
SPECIFIC GRAVITY, URINE: 1.008 (ref 1.005–1.030)
pH: 6.5 (ref 5.0–8.0)

## 2016-03-05 LAB — CBC
HCT: 38.8 % — ABNORMAL LOW (ref 39.0–52.0)
Hemoglobin: 12 g/dL — ABNORMAL LOW (ref 13.0–17.0)
MCH: 28.4 pg (ref 26.0–34.0)
MCHC: 30.9 g/dL (ref 30.0–36.0)
MCV: 91.9 fL (ref 78.0–100.0)
PLATELETS: 227 10*3/uL (ref 150–400)
RBC: 4.22 MIL/uL (ref 4.22–5.81)
RDW: 14 % (ref 11.5–15.5)
WBC: 13.2 10*3/uL — ABNORMAL HIGH (ref 4.0–10.5)

## 2016-03-05 LAB — URINE MICROSCOPIC-ADD ON

## 2016-03-05 LAB — BASIC METABOLIC PANEL
Anion gap: 9 (ref 5–15)
BUN: 31 mg/dL — AB (ref 6–20)
CALCIUM: 9.1 mg/dL (ref 8.9–10.3)
CO2: 31 mmol/L (ref 22–32)
CREATININE: 2.19 mg/dL — AB (ref 0.61–1.24)
Chloride: 100 mmol/L — ABNORMAL LOW (ref 101–111)
GFR calc non Af Amer: 32 mL/min — ABNORMAL LOW (ref >60)
GFR, EST AFRICAN AMERICAN: 37 mL/min — AB (ref >60)
GLUCOSE: 118 mg/dL — AB (ref 65–99)
Potassium: 4.2 mmol/L (ref 3.5–5.1)
Sodium: 140 mmol/L (ref 135–145)

## 2016-03-05 LAB — I-STAT TROPONIN, ED: TROPONIN I, POC: 0.02 ng/mL (ref 0.00–0.08)

## 2016-03-05 MED ORDER — OXYCODONE-ACETAMINOPHEN 10-325 MG PO TABS: 1.0000 | ORAL_TABLET | Freq: Four times a day (QID) | ORAL | Status: DC | PRN

## 2016-03-05 MED ORDER — ACETAMINOPHEN 650 MG RE SUPP: 650.0000 mg | Freq: Four times a day (QID) | RECTAL | Status: DC | PRN

## 2016-03-05 MED ORDER — ACETAMINOPHEN 325 MG PO TABS: 650.0000 mg | ORAL_TABLET | Freq: Four times a day (QID) | ORAL | Status: DC | PRN

## 2016-03-05 MED ORDER — CARVEDILOL 25 MG PO TABS
25.0000 mg | ORAL_TABLET | Freq: Two times a day (BID) | ORAL | Status: DC
  Administered 2016-03-06 – 2016-03-10 (×8): 25 mg via ORAL
  Filled 2016-03-05: qty 2
  Filled 2016-03-05 (×2): qty 1
  Filled 2016-03-05 (×2): qty 2
  Filled 2016-03-05: qty 1
  Filled 2016-03-05 (×3): qty 2

## 2016-03-05 MED ORDER — ALLOPURINOL 100 MG PO TABS
100.0000 mg | ORAL_TABLET | Freq: Two times a day (BID) | ORAL | Status: DC
  Administered 2016-03-06 – 2016-03-10 (×8): 100 mg via ORAL
  Filled 2016-03-05 (×9): qty 1

## 2016-03-05 MED ORDER — SPIRONOLACTONE 25 MG PO TABS
25.0000 mg | ORAL_TABLET | Freq: Every day | ORAL | Status: DC
  Administered 2016-03-06 – 2016-03-10 (×4): 25 mg via ORAL
  Filled 2016-03-05 (×5): qty 1

## 2016-03-05 MED ORDER — APIXABAN 5 MG PO TABS
5.0000 mg | ORAL_TABLET | Freq: Two times a day (BID) | ORAL | Status: DC
  Administered 2016-03-06 – 2016-03-10 (×8): 5 mg via ORAL
  Filled 2016-03-05 (×9): qty 1

## 2016-03-05 MED ORDER — ISOSORB DINITRATE-HYDRALAZINE 20-37.5 MG PO TABS
2.0000 | ORAL_TABLET | Freq: Three times a day (TID) | ORAL | Status: DC
  Administered 2016-03-06 – 2016-03-10 (×12): 2 via ORAL
  Filled 2016-03-05 (×13): qty 2

## 2016-03-05 MED ORDER — FUROSEMIDE 10 MG/ML IJ SOLN
120.0000 mg | Freq: Two times a day (BID) | INTRAVENOUS | Status: DC
  Administered 2016-03-06 – 2016-03-10 (×9): 120 mg via INTRAVENOUS
  Filled 2016-03-05 (×11): qty 12

## 2016-03-05 MED ORDER — POTASSIUM CHLORIDE CRYS ER 20 MEQ PO TBCR
20.0000 meq | EXTENDED_RELEASE_TABLET | Freq: Two times a day (BID) | ORAL | Status: DC
  Administered 2016-03-06 (×2): 20 meq via ORAL
  Filled 2016-03-05 (×2): qty 1

## 2016-03-05 MED ORDER — SODIUM CHLORIDE 0.9% FLUSH
3.0000 mL | Freq: Two times a day (BID) | INTRAVENOUS | Status: DC
  Administered 2016-03-06 – 2016-03-08 (×5): 3 mL via INTRAVENOUS

## 2016-03-05 NOTE — ED Triage Notes (Signed)
Pt here for chest tightness and SOB when walking x 2 days

## 2016-03-05 NOTE — ED Provider Notes (Addendum)
[facility]-EMERGENCY DEPT Provider Note   CSN: [id number] Arrival date & time: 03/05/16  4098  First Provider Contact:  First MD Initiated Contact with Patient 03/05/16 2024        History   Chief Complaint Chief Complaint  Patient presents with  . Chest Pain    HPI Eric Murillo is a 55 y.o. male.Complains of anterior chest pressure onset 2 days ago worse with exertion and improved with rest. Symptoms accompanied by shortness of breath. Presently asymptomatic. No treatment prior to coming here. Presently asymptomatic. Other associated symptoms include shortness of breath worse with lying flat and improved with sitting up. He denies noncompliance with medications or diet. He also reports increasing bilateral leg edema over the past 2 days No other associated symptoms  HPI  Past Medical History:  Diagnosis Date  . Anemia of chronic disease   . Arthritis   . Atrial flutter (HCC)    DCCV 9/15  . Chest pain on exertion   . Chronic systolic congestive heart failure, NYHA class 2 (HCC)    EF 20-25% 1/16  . CKD (chronic kidney disease) stage 3, GFR 30-59 ml/min   . Dyslipidemia   . Gout    Of big toe  . Hyperglycemia   . Hyperlipidemia   . Hypertension   . Morbid obesity with BMI of 45.0-49.9, adult (HCC)   . Non-ischemic cardiomyopathy (HCC)   . Organic erectile dysfunction   . OSA (obstructive sleep apnea)   . Pilonidal cyst   . Submandibular sialolithiasis    Right    Patient Active Problem List   Diagnosis Date Noted  . Acute on chronic congestive heart failure (HCC) 02/28/2016  . 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 CHF (congestive heart failure), NYHA class III (HCC) 05/21/2015  . Edema, lower extremity 05/15/2015  . Acute bilateral low back pain without sciatica 05/15/2015  . Long term current use of anticoagulant therapy 04/07/2015  . Health care maintenance 03/10/2015  . Osteoarthritis 06/30/2014  . Pilonidal cyst 04/27/2014  . Acute on chronic systolic and  diastolic heart failure, NYHA class 4 (HCC) 04/21/2014  . Anemia of chronic disease 04/21/2014  . Gout 04/21/2014  . Atrial flutter (HCC) 03/24/2014  . Hyperglycemia 02/06/2012  . OSA (obstructive sleep apnea) 12/11/2011  . Cardiomyopathy, nonischemic (HCC) 09/30/2010  . Chronic systolic heart failure (HCC) 09/15/2010  . Encounter for preventive health examination 09/15/2010  . ERECTILE DYSFUNCTION, ORGANIC 03/03/2010  . Hyperlipidemia 07/04/2006  . Morbid obesity with BMI of 45.0-49.9, adult (HCC) 07/04/2006  . Hypertensive cardiovascular disease 05/30/2006  . CKD (chronic kidney disease) stage 3, GFR 30-59 ml/min 05/30/2006    Past Surgical History:  Procedure Laterality Date  . CARDIAC CATHETERIZATION  2012  . CARDIOVERSION N/A 04/03/2014   Procedure: CARDIOVERSION;  Surgeon: Laurey Morale, MD;  Location: [facility] ENDOSCOPY;  Service: Cardiovascular;  Laterality: N/A;  . COLONOSCOPY    . EP IMPLANTABLE DEVICE N/A 05/21/2015   Procedure: ICD Implant;  Surgeon: Duke Salvia, MD;  Location: [facility] INVASIVE CV LAB;  Service: Cardiovascular;  Laterality: N/A;  . LEFT AND RIGHT HEART CATHETERIZATION WITH CORONARY ANGIOGRAM N/A 04/24/2014   Procedure: LEFT AND RIGHT HEART CATHETERIZATION WITH CORONARY ANGIOGRAM;  Surgeon: Laurey Morale, MD;  Location: [facility] CATH LAB;  Service: Cardiovascular;  Laterality: N/A;  . SALIVARY GLAND SURGERY  09/12/2012  . SUBMANDIBULAR GLAND EXCISION Right 09/12/2012   Procedure: Removal Right Submandibular Larina Bras;  Surgeon: Serena Colonel, MD;  Location: [facility] OR;  Service: ENT;  Laterality:  Right;  . TEE WITHOUT CARDIOVERSION N/A 04/03/2014   Procedure: TRANSESOPHAGEAL ECHOCARDIOGRAM (TEE);  Surgeon: Laurey Morale, MD;  Location: [facility] ENDOSCOPY;  Service: Cardiovascular;  Laterality: N/A;    OB History    No data available       Home Medications    Prior to Admission medications   Medication Sig Start Date End Date Taking? Authorizing Provider  allopurinol  (ZYLOPRIM) 100 MG tablet TAKE 1 TABLET BY MOUTH TWO TIMES DAILY 11/25/15   Earl Lagos, MD  BIDIL 20-37.5 MG tablet TAKE 2 TABLETS BY MOUTH 3 TIMES A DAY 09/27/15   Laurey Morale, MD  carvedilol (COREG) 25 MG tablet TAKE 1 TABLET BY MOUTH TWICE A DAY WITH A MEAL 11/11/15   Dolores Patty, MD  colchicine 0.6 MG tablet Take 0.6 mg by mouth 2 (two) times daily as needed (gouty flare). Reported on 01/18/2016    Historical Provider, MD  ELIQUIS 5 MG TABS tablet TAKE 1 TABLET BY MOUTH TWICE A DAY 10/13/15   Dolores Patty, MD  KLOR-CON M20 20 MEQ tablet TAKE 1 TABLET BY MOUTH TWICE A DAY 01/26/16   Dolores Patty, MD  lisinopril (PRINIVIL,ZESTRIL) 2.5 MG tablet Take 1 tablet (2.5 mg total) by mouth daily. 10/11/15   Laurey Morale, MD  metolazone (ZAROXOLYN) 2.5 MG tablet Take 1 tab( 2.5 mg) once weekly AS NEEDED for weight gain 3 lbs or more. 01/18/16   Laurey Morale, MD  oxyCODONE-acetaminophen (PERCOCET) 10-325 MG tablet Take 1 tablet by mouth every 6 (six) hours as needed for pain. 01/26/16   Darreld Mclean, MD  spironolactone (ALDACTONE) 25 MG tablet TAKE 1 TABLET (25 MG TOTAL) BY MOUTH DAILY. 02/23/16   Dolores Patty, MD  torsemide (DEMADEX) 20 MG tablet TAKE 3 TABLETS BY MOUTH EVERY MORNING AND 2 EVERY AFTERNOON 03/02/16   Laurey Morale, MD    Family History Family History  Problem Relation Age of Onset  . Hypertension Mother   . Hypertension Father   . Cancer Father     brother died of brain cancer; sister died of bone cancer  . Diabetes Sister   . Diabetes Brother   . Colon cancer Maternal Aunt   . Cardiomyopathy Neg Hx     Social History Social History  Substance Use Topics  . Smoking status: Never Smoker  . Smokeless tobacco: Never Used  . Alcohol use No     Allergies   Beta adrenergic blockers   Review of Systems Review of Systems  Constitutional: Negative.   HENT: Negative.   Respiratory: Positive for shortness of breath.   Cardiovascular: Positive for  chest pain and leg swelling.       Syncope  Gastrointestinal: Negative.   Musculoskeletal: Negative.   Skin: Negative.   Allergic/Immunologic: Negative.   Neurological: Negative.   Psychiatric/Behavioral: Negative.   All other systems reviewed and are negative.    Physical Exam Updated Vital Signs BP 120/73 (BP Location: Right Arm)   Pulse 62   Temp 98.4 F (36.9 C) (Oral)   Resp 16   Ht 5\' 9"  (1.753 m)   Wt (!) 340 lb 3.2 oz (154.3 kg)   SpO2 96%   BMI 50.24 kg/m   Physical Exam  Constitutional: He appears well-developed and well-nourished.  HENT:  Head: Normocephalic and atraumatic.  Eyes: Conjunctivae are normal. Pupils are equal, round, and reactive to light.  Neck: Neck supple. No tracheal deviation present. No thyromegaly present.  Cardiovascular: Normal  rate and regular rhythm.   No murmur heard. Pulmonary/Chest: Effort normal and breath sounds normal.  Abdominal: Soft. Bowel sounds are normal. He exhibits no distension. There is no tenderness.  Morbidly obese  Musculoskeletal: Normal range of motion. He exhibits edema. He exhibits no tenderness.  2+ pretibial pitting edema bilaterally  Neurological: He is alert. Coordination normal.  Skin: Skin is warm and dry. No rash noted.  Psychiatric: He has a normal mood and affect.  Nursing note and vitals reviewed.    ED Treatments / Results  Labs (all labs ordered are listed, but only abnormal results are displayed) Labs Reviewed  BASIC METABOLIC PANEL - Abnormal; Notable for the following:       Result Value   Chloride 100 (*)    Glucose, Bld 118 (*)    BUN 31 (*)    Creatinine, Ser 2.19 (*)    GFR calc non Af Amer 32 (*)    GFR calc Af Amer 37 (*)    All other components within normal limits  CBC - Abnormal; Notable for the following:    WBC 13.2 (*)    Hemoglobin 12.0 (*)    HCT 38.8 (*)    All other components within normal limits  I-STAT TROPOININ, ED    EKG  EKG  Interpretation  Date/Time:  Sunday March 05 2016 18:32:57 EDT Ventricular Rate:  78 PR Interval:    QRS Duration: 108 QT Interval:  420 QTC Calculation: 478 R Axis:   -115 Text Interpretation:  Atrial flutter with variable A-V block Low voltage QRS Anterolateral infarct , age undetermined Abnormal ECG No significant change since last tracing Confirmed by Ethelda Chick  MD, Lennex Pietila [phone]) on 03/05/2016 8:24:03 PM       Radiology Dg Chest 2 View  Result Date: 03/05/2016 CLINICAL DATA:  Acute onset of generalized chest tightness, shortness of breath and dry cough. Initial encounter. EXAM: CHEST  2 VIEW COMPARISON:  Chest radiograph performed 02/28/2016 FINDINGS: Vascular congestion is noted. Bibasilar airspace opacities may reflect mild interstitial edema, though pneumonia might have a similar appearance. No pleural effusion or pneumothorax is seen. The heart is borderline enlarged. An AICD is noted at the left chest wall, with a single lead ending at the right ventricle. No acute osseous abnormalities are identified. IMPRESSION: Vascular congestion and borderline cardiomegaly. Bibasilar airspace opacities may reflect mild interstitial edema, though pneumonia might have a similar appearance depending on the patient's symptoms. Electronically Signed   By: Roanna Raider M.D.   On: 03/05/2016 19:15    Procedures Procedures (including critical care time)  Medications Ordered in ED Medications - No data to display   Initial Impression / Assessment and Plan / ED Course  I have reviewed the triage vital signs and the nursing notes. Chest x-ray viewed by me Results for orders placed or performed during the hospital encounter of 03/05/16  Basic metabolic panel  Result Value Ref Range   Sodium 140 135 - 145 mmol/L   Potassium 4.2 3.5 - 5.1 mmol/L   Chloride 100 (L) 101 - 111 mmol/L   CO2 31 22 - 32 mmol/L   Glucose, Bld 118 (H) 65 - 99 mg/dL   BUN 31 (H) 6 - 20 mg/dL   Creatinine, Ser 9.56 (H)  0.61 - 1.24 mg/dL   Calcium 9.1 8.9 - 21.3 mg/dL   GFR calc non Af Amer 32 (L) >60 mL/min   GFR calc Af Amer 37 (L) >60 mL/min   Anion gap 9 5 -  15  CBC  Result Value Ref Range   WBC 13.2 (H) 4.0 - 10.5 K/uL   RBC 4.22 4.22 - 5.81 MIL/uL   Hemoglobin 12.0 (L) 13.0 - 17.0 g/dL   HCT 71.6 (L) 96.7 - 89.3 %   MCV 91.9 78.0 - 100.0 fL   MCH 28.4 26.0 - 34.0 pg   MCHC 30.9 30.0 - 36.0 g/dL   RDW 81.0 17.5 - 10.2 %   Platelets 227 150 - 400 K/uL  I-stat troponin, ED  Result Value Ref Range   Troponin i, poc 0.02 0.00 - 0.08 ng/mL   Comment 3           Dg Chest 2 View  Result Date: 03/05/2016 CLINICAL DATA:  Acute onset of generalized chest tightness, shortness of breath and dry cough. Initial encounter. EXAM: CHEST  2 VIEW COMPARISON:  Chest radiograph performed 02/28/2016 FINDINGS: Vascular congestion is noted. Bibasilar airspace opacities may reflect mild interstitial edema, though pneumonia might have a similar appearance. No pleural effusion or pneumothorax is seen. The heart is borderline enlarged. An AICD is noted at the left chest wall, with a single lead ending at the right ventricle. No acute osseous abnormalities are identified. IMPRESSION: Vascular congestion and borderline cardiomegaly. Bibasilar airspace opacities may reflect mild interstitial edema, though pneumonia might have a similar appearance depending on the patient's symptoms. Electronically Signed   By: Roanna Raider M.D.   On: 03/05/2016 19:15   Dg Chest 2 View  Result Date: 02/28/2016 CLINICAL DATA:  Shortness of breath for 5 days. EXAM: CHEST  2 VIEW COMPARISON:  07/24/2015 FINDINGS: Asymmetric elevation right hemidiaphragm is stable. The cardio pericardial silhouette is enlarged. There is pulmonary vascular congestion without overt pulmonary edema. Left pacer/AICD remains in place. The visualized bony structures of the thorax are intact. IMPRESSION: stable. Cardiomegaly with vascular congestion. No acute cardiopulmonary  findings. Electronically Signed   By: Kennith Center M.D.   On: 02/28/2016 13:18   Pertinent labs & imaging results that were available during my care of the patient were reviewed by me and considered in my medical decision making (see chart for details).  Clinical Course  Concern for anginal type symptoms, chest pressure with exertion and improved with rest. Heart score equals 5. Renal insufficiency is chronic Internal medicine resident physician consulted to evaluate patient in ED to arrange for overnight stay. Suggest cardiology consult  Doubt pneumonia. No cough no fever lungs clear auscultation  Final Clinical Impressions(s) / ED Diagnoses  Diagnosis #1 exertional chest pain #2 chronic renal insufficiency Final diagnoses:  None    New Prescriptions New Prescriptions   No medications on file     Doug Sou, MD 03/05/16 2050    Doug Sou, MD 03/05/16 2051

## 2016-03-05 NOTE — H&P (Signed)
Date: 03/05/2016               Patient Name:  Eric Murillo MRN: [id number]  DOB: [birth year] Age / Sex: 55 y.o., male   PCP: Eric Mclean, MD         Medical Service: Internal Medicine Teaching Service         Attending Physician: Dr. Doug Sou, MD    First Contact: Dr. Nyra Market, MD Pager: [phone]  Second Contact: Dr. Hyacinth Meeker, MD Pager: [phone]       After Hours (After 5p/  First Contact Pager: [phone]  weekends / holidays): Second Contact Pager: [phone]   Chief Complaint: worsening shortness of breath and bilateral leg swelling.  History of Present Illness:  [name] is a very pleasant 55 year old male with MHx significant for systolic CHF (EF 10%) with ICD, atrial flutter, morbid obesity, gout, HLD and HTN who presents for evaluation of a 2 day history of worsening shortness of breath and BL LE swelling. Patient reports he has been getting intermittent shortness of breath and chest tightness with exertion and laying flat on his back starting yesterday. He also admits to abdominal distension and increased BL LE edema. He was recently discharged from the hospital 02/29/16 with a CHF exacerbation due to excessive sodium intake. He was subsequently diuresed and sent home on his home meds, salt restriction up to 1.5 grams and fluid restriction up to 1.5L. Patient reports compliance with his medication however has not been able to reduce his sodium or fluid intake. He reports a diet of canned foods, fried foods and fruits. [name] denies any fever, chills, productive cough, abdominal pain, nausea, vomiting, diarrhea, constipation, dysuria or diaphoresis.   Meds:  Current Meds  Medication Sig  . allopurinol (ZYLOPRIM) 100 MG tablet TAKE 1 TABLET BY MOUTH TWO TIMES DAILY  . BIDIL 20-37.5 MG tablet TAKE 2 TABLETS BY MOUTH 3 TIMES A DAY  . carvedilol (COREG) 25 MG tablet TAKE 1 TABLET BY MOUTH TWICE A DAY WITH A MEAL  . colchicine 0.6 MG tablet Take 0.6 mg by  mouth 2 (two) times daily as needed (gouty flare). Reported on 01/18/2016  . ELIQUIS 5 MG TABS tablet TAKE 1 TABLET BY MOUTH TWICE A DAY  . KLOR-CON M20 20 MEQ tablet TAKE 1 TABLET BY MOUTH TWICE A DAY  . lisinopril (PRINIVIL,ZESTRIL) 2.5 MG tablet Take 1 tablet (2.5 mg total) by mouth daily.  . metolazone (ZAROXOLYN) 2.5 MG tablet Take 1 tab( 2.5 mg) once weekly AS NEEDED for weight gain 3 lbs or more.  Marland Kitchen oxyCODONE-acetaminophen (PERCOCET) 10-325 MG tablet Take 1 tablet by mouth every 6 (six) hours as needed for pain.  Marland Kitchen spironolactone (ALDACTONE) 25 MG tablet TAKE 1 TABLET (25 MG TOTAL) BY MOUTH DAILY.  Marland Kitchen torsemide (DEMADEX) 20 MG tablet TAKE 3 TABLETS BY MOUTH EVERY MORNING AND 2 EVERY AFTERNOON   Allergies: Allergies as of 03/05/2016 - Review Complete 03/05/2016  Allergen Reaction Noted  . Beta adrenergic blockers Other (See Comments) 10/19/2008   Past Medical History:  Diagnosis Date  . Anemia of chronic disease   . Arthritis   . Atrial flutter (HCC)    DCCV 9/15  . Chest pain on exertion   . Chronic systolic congestive heart failure, NYHA class 2 (HCC)    EF 20-25% 1/16  . CKD (chronic kidney disease) stage 3, GFR 30-59 ml/min   . Dyslipidemia   . Gout  Of big toe  . Hyperglycemia   . Hyperlipidemia   . Hypertension   . Morbid obesity with BMI of 45.0-49.9, adult (HCC)   . Non-ischemic cardiomyopathy (HCC)   . Organic erectile dysfunction   . OSA (obstructive sleep apnea)   . Pilonidal cyst   . Submandibular sialolithiasis    Right    Family History:  Mother: HTN Father: deceased, cancer Brother: Brain cancer Sister: bone cancer  Social History:  Tobacco: has never smoked Alcohol: denies Drug use: denies  Review of Systems: A complete ROS was negative except as per HPI.   Physical Exam: Blood pressure 120/73, pulse 62, temperature 98.4 F (36.9 C), temperature source Oral, resp. rate 16, height 5\' 9"  (1.753 m), weight (!) 154.3 kg (340 lb 3.2 oz), SpO2 96  %. General: Alert, comfortable morbidly obese male resting with head of bed elevated. In no acute distress.  Cardiovascular: Regular rate and rhythm.  Pulmonary: faint bibasilar crackles BL. No wheezing. Breathing unlabored.  Abdomen: Distended. Non-tender, no guarding. +bowel sounds. Small reducible non-tender umbilical hernia noted. Extremities: 1+ Edema BL LE. No erythema or drainage.   EKG:   EKG Interpretation  Date/Time:  Sunday March 05 2016 18:32:57 EDT Ventricular Rate:  78 PR Interval:    QRS Duration: 108 QT Interval:  420 QTC Calculation: 478 R Axis:   -115 Text Interpretation:  Atrial flutter with variable A-V block Low voltage QRS Anterolateral infarct , age undetermined Abnormal ECG No significant change since last tracing Confirmed by Ethelda Chick  MD, SAM [phone]) on 03/05/2016 8:24:03 PM      CXR: demonstrates vascular congestion with cardiomegaly. Also demonstrates bibasilar airspace disease, indicating mild interstitial edema or pneumonia  Assessment & Plan by Problem: Principal Problem:   Acute on chronic systolic heart failure (HCC) Active Problems:   Hyperlipidemia   Morbid obesity with BMI of 45.0-49.9, adult (HCC)   CKD (chronic kidney disease) stage 3, GFR 30-59 ml/min   Cardiomyopathy, nonischemic (HCC)   OSA (obstructive sleep apnea)   Atrial flutter (HCC)   Anemia of chronic disease   Gout  1. Acute on Chronic Systolic Heart Failure Last ECHO 6/616 which showed EF 30-35%. Last cath 3/12 which showed EF of 30-35%, without evidence of ischemia. Pt clinically appears volume overloaded, likely due to patients noncompliance with lifestyle modifications including a sodium and fluid restricted diet. Good discussion was had with patient regarding the importance of sodium and fluid restriction. Nutrition has been consulted to assist the patient in these matters.  Dry weight appears to be ~325-330, current weight 340. Patient will likely require several days of  diuresis to achieve dry weight. Home dose of Torsemide totals 100mg  daily. Patient diuresing with IV Lasix 120mg  BID and will monitor fluid balance. Daily weights. Home Bidil, coreg and spironolactone continued.   2. Chronic Kidney Disease, Stage 3: Creatinine elevated at 2.2, patients baseline appears to be 1.8. This is likely due to patients volume status. Will monitor.  3. Cardiomyopathy, non-ischemic EKG negative for new ischemic process. Troponin negative x1. Fat pad biopsy 6/16 negative for evidence of amyloidosis.  On monitor.  4. Gout: Home Allopurinol 100mg  BID continued. Pt denies active flare.  5. Anemia of Chronic Disease: Hb 12.0, at baseline. Denies any source of bleeding. Will monitor.   6. Atrial Flutter:  Pt on coreg and Eliquis, continued.   Diet: HH DVT Prophylaxis: Pt on Eliquis Fluids: Saline lock in place, no fluids running Code status: Full code  Dispo: Admit patient  to Inpatient with expected length of stay greater than 2 midnights.  SignedNoemi Chapel, DO 03/05/2016, 9:14 PM  Pager: [phone]

## 2016-03-06 ENCOUNTER — Telehealth: Payer: Self-pay | Admitting: Student-PharmD

## 2016-03-06 ENCOUNTER — Encounter: Payer: Self-pay | Admitting: Licensed Clinical Social Worker

## 2016-03-06 ENCOUNTER — Encounter: Payer: Self-pay | Admitting: Student-PharmD

## 2016-03-06 DIAGNOSIS — N179 Acute kidney failure, unspecified: Secondary | ICD-10-CM

## 2016-03-06 DIAGNOSIS — M1A9XX0 Chronic gout, unspecified, without tophus (tophi): Secondary | ICD-10-CM

## 2016-03-06 DIAGNOSIS — I5023 Acute on chronic systolic (congestive) heart failure: Secondary | ICD-10-CM

## 2016-03-06 DIAGNOSIS — I484 Atypical atrial flutter: Secondary | ICD-10-CM

## 2016-03-06 DIAGNOSIS — Z95810 Presence of automatic (implantable) cardiac defibrillator: Secondary | ICD-10-CM

## 2016-03-06 LAB — BASIC METABOLIC PANEL
ANION GAP: 8 (ref 5–15)
Anion gap: 7 (ref 5–15)
BUN: 32 mg/dL — AB (ref 6–20)
BUN: 34 mg/dL — ABNORMAL HIGH (ref 6–20)
CALCIUM: 9 mg/dL (ref 8.9–10.3)
CHLORIDE: 102 mmol/L (ref 101–111)
CO2: 31 mmol/L (ref 22–32)
CO2: 29 mmol/L (ref 22–32)
Calcium: 9.1 mg/dL (ref 8.9–10.3)
Chloride: 101 mmol/L (ref 101–111)
Creatinine, Ser: 2.16 mg/dL — ABNORMAL HIGH (ref 0.61–1.24)
Creatinine, Ser: 2.12 mg/dL — ABNORMAL HIGH (ref 0.61–1.24)
GFR calc Af Amer: 38 mL/min — ABNORMAL LOW (ref >60)
GFR calc Af Amer: 39 mL/min — ABNORMAL LOW (ref >60)
GFR calc non Af Amer: 33 mL/min — ABNORMAL LOW (ref >60)
GFR, EST NON AFRICAN AMERICAN: 33 mL/min — AB (ref >60)
GLUCOSE: 149 mg/dL — AB (ref 65–99)
Glucose, Bld: 120 mg/dL — ABNORMAL HIGH (ref 65–99)
POTASSIUM: 4.1 mmol/L (ref 3.5–5.1)
Potassium: 3.8 mmol/L (ref 3.5–5.1)
SODIUM: 140 mmol/L (ref 135–145)
SODIUM: 138 mmol/L (ref 135–145)

## 2016-03-06 LAB — MAGNESIUM: MAGNESIUM: 2.5 mg/dL — AB (ref 1.7–2.4)

## 2016-03-06 MED ORDER — METOLAZONE 5 MG PO TABS
5.0000 mg | ORAL_TABLET | Freq: Once | ORAL | Status: AC
  Administered 2016-03-06: 5 mg via ORAL
  Filled 2016-03-06: qty 1

## 2016-03-06 MED ORDER — OXYCODONE HCL 5 MG PO TABS
5.0000 mg | ORAL_TABLET | Freq: Four times a day (QID) | ORAL | Status: DC | PRN
  Administered 2016-03-09 (×2): 5 mg via ORAL
  Filled 2016-03-06 (×2): qty 1

## 2016-03-06 MED ORDER — OXYCODONE-ACETAMINOPHEN 5-325 MG PO TABS
1.0000 | ORAL_TABLET | Freq: Four times a day (QID) | ORAL | Status: DC | PRN
  Administered 2016-03-09 (×2): 1 via ORAL
  Filled 2016-03-06 (×2): qty 1

## 2016-03-06 NOTE — Telephone Encounter (Signed)
error 

## 2016-03-06 NOTE — Progress Notes (Signed)
A user error has taken place: encounter opened in error, closed for administrative reasons.

## 2016-03-06 NOTE — Consult Note (Signed)
Advanced Heart Failure Team Consult Note  Referring Physician: Dr Oswaldo Done Primary Physician: Dr Glenard Haring Primary Cardiologist:  Dr. Shirlee Latch  Reason for Consultation: A/C systolic HF.   HPI:    Giovonnie Seiger is a 55 y.o. with history of morbid obesity, hyperlipidemia, HTN, atrial flutter (s/p TEE DC-CV 04/03/14), NICM with chronic systolic HF and severe OSA but unable to tolerate CPAP.    He underwent work-up for cardiac amyloid. SPEP negative. Fat pad biopsy 6/16 with benign adipose tissue. Unable to get cMRI due to size.  Last echo in 6/16 showed EF 30-35%, PA systolic pressure 62 mmHg.  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    Last seen in HF clinic 01/18/16. Had recent overload by optivol but improved with metolazone. Recommended metolazone prn. Weight 333 lbs.   Admitted 8/7 - 03/02/16 with A/C CHF. Diuresed 5 lbs with IV lasix. Overload thought to be 2/2 two entire bags of "pork rinds".  Presented to [facility] 03/05/16 with 2 days of worsening SOB. States he has not been watching his sodium or fluids, continues to eat canned foods, fried foods, and large amounts of fruits. Pertinent admission labs include Creatinine 2.19, K 4.2, Troponin negative. CXR shows bibasilar airspace opacities/ ? Mild interstitial edema.   Feels OK at rest. SOB with mild exertion and also has chest tightness. Doesn't watch fluid or salt. Takes all medications as directed.  Unfortunately, ran out of Eliquis and took several days to get refill.  He has missed an estimate of 3-5 doses in the past month. Denies recent illness. Has less energy and gets SOB after doing less activity.   EKG 03/06/16 with A flutter, atypical  Review of Systems: [y] = yes, [ ]  = no   General: Weight gain [y]; Weight loss [ ] ; Anorexia [ ] ; Fatigue [y]; Fever [ ] ; Chills [ ] ; Weakness [ ]   Cardiac: Chest pain/pressure [y]; Resting SOB [ ] ; Exertional SOB [y]; Orthopnea [ ] ; Pedal Edema [y]; Palpitations [y]; Syncope [ ] ; Presyncope [ ] ; Paroxysmal  nocturnal dyspnea[ ]   Pulmonary: Cough [ ] ; Wheezing[ ] ; Hemoptysis[ ] ; Sputum [ ] ; Snoring [ ]   GI: Vomiting[ ] ; Dysphagia[ ] ; Melena[ ] ; Hematochezia [ ] ; Heartburn[ ] ; Abdominal pain [ ] ; Constipation [ ] ; Diarrhea [ ] ; BRBPR [ ]   GU: Hematuria[ ] ; Dysuria [ ] ; Nocturia[ ]   Vascular: Pain in legs with walking [ ] ; Pain in feet with lying flat [ ] ; Non-healing sores [ ] ; Stroke [ ] ; TIA [ ] ; Slurred speech [ ] ;  Neuro: Headaches[ ] ; Vertigo[ ] ; Seizures[ ] ; Paresthesias[ ] ;Blurred vision [ ] ; Diplopia [ ] ; Vision changes [ ]   Ortho/Skin: Arthritis [y]; Joint pain [y]; Muscle pain [ ] ; Joint swelling [ ] ; Back Pain [ ] ; Rash [ ]   Psych: Depression[ ] ; Anxiety[ ]   Heme: Bleeding problems [ ] ; Clotting disorders [ ] ; Anemia [ ]   Endocrine: Diabetes [ ] ; Thyroid dysfunction[ ]   Home Medications Prior to Admission medications   Medication Sig Start Date End Date Taking? Authorizing Provider  allopurinol (ZYLOPRIM) 100 MG tablet TAKE 1 TABLET BY MOUTH TWO TIMES DAILY 11/25/15  Yes Nischal Narendra, MD  BIDIL 20-37.5 MG tablet TAKE 2 TABLETS BY MOUTH 3 TIMES A DAY 09/27/15  Yes Laurey Morale, MD  carvedilol (COREG) 25 MG tablet TAKE 1 TABLET BY MOUTH TWICE A DAY WITH A MEAL 11/11/15  Yes Dolores Patty, MD  colchicine 0.6 MG tablet Take 0.6 mg by mouth 2 (two) times  daily as needed (gouty flare). Reported on 01/18/2016   Yes Historical Provider, MD  ELIQUIS 5 MG TABS tablet TAKE 1 TABLET BY MOUTH TWICE A DAY 10/13/15  Yes Bevelyn Buckles Bensimhon, MD  KLOR-CON M20 20 MEQ tablet TAKE 1 TABLET BY MOUTH TWICE A DAY 01/26/16  Yes Bevelyn Buckles Bensimhon, MD  lisinopril (PRINIVIL,ZESTRIL) 2.5 MG tablet Take 1 tablet (2.5 mg total) by mouth daily. 10/11/15  Yes Laurey Morale, MD  metolazone (ZAROXOLYN) 2.5 MG tablet Take 1 tab( 2.5 mg) once weekly AS NEEDED for weight gain 3 lbs or more. 01/18/16  Yes Laurey Morale, MD  oxyCODONE-acetaminophen (PERCOCET) 10-325 MG tablet Take 1 tablet by mouth every 6 (six) hours as  needed for pain. 01/26/16  Yes Darreld Ami Thornsberry, MD  spironolactone (ALDACTONE) 25 MG tablet TAKE 1 TABLET (25 MG TOTAL) BY MOUTH DAILY. 02/23/16  Yes Dolores Patty, MD  torsemide (DEMADEX) 20 MG tablet TAKE 3 TABLETS BY MOUTH EVERY MORNING AND 2 EVERY AFTERNOON 03/02/16  Yes Laurey Morale, MD    Past Medical History: Past Medical History:  Diagnosis Date  . Anemia of chronic disease   . Arthritis   . Atrial flutter (HCC)    DCCV 9/15  . Chest pain on exertion   . Chronic systolic congestive heart failure, NYHA class 2 (HCC)    EF 20-25% 1/16  . CKD (chronic kidney disease) stage 3, GFR 30-59 ml/min   . Dyslipidemia   . Gout    Of big toe  . Hyperglycemia   . Hyperlipidemia   . Hypertension   . Morbid obesity with BMI of 45.0-49.9, adult (HCC)   . Non-ischemic cardiomyopathy (HCC)   . Organic erectile dysfunction   . OSA (obstructive sleep apnea)   . Pilonidal cyst   . Submandibular sialolithiasis    Right    Past Surgical History: Past Surgical History:  Procedure Laterality Date  . CARDIAC CATHETERIZATION  2012  . CARDIOVERSION N/A 04/03/2014   Procedure: CARDIOVERSION;  Surgeon: Laurey Morale, MD;  Location: [facility] ENDOSCOPY;  Service: Cardiovascular;  Laterality: N/A;  . COLONOSCOPY    . EP IMPLANTABLE DEVICE N/A 05/21/2015   Procedure: ICD Implant;  Surgeon: Duke Salvia, MD;  Location: [facility] INVASIVE CV LAB;  Service: Cardiovascular;  Laterality: N/A;  . LEFT AND RIGHT HEART CATHETERIZATION WITH CORONARY ANGIOGRAM N/A 04/24/2014   Procedure: LEFT AND RIGHT HEART CATHETERIZATION WITH CORONARY ANGIOGRAM;  Surgeon: Laurey Morale, MD;  Location[facility] CATH LAB;  Service: Cardiovascular;  Laterality: N/A;  . SALIVARY GLAND SURGERY  09/12/2012  . SUBMANDIBULAR GLAND EXCISION Right 09/12/2012   Procedure: Removal Right Submandibular Larina Bras;  Surgeon: Serena Colonel, MD;  Location: [facility] OR;  Service: ENT;  Laterality: Right;  . TEE WITHOUT CARDIOVERSION N/A 04/03/2014   Procedure:  TRANSESOPHAGEAL ECHOCARDIOGRAM (TEE);  Surgeon: Laurey Morale, MD;  Location: [facility] ENDOSCOPY;  Service: Cardiovascular;  Laterality: N/A;    Family History: Family History  Problem Relation Age of Onset  . Hypertension Mother   . Hypertension Father   . Cancer Father     brother died of brain cancer; sister died of bone cancer  . Diabetes Sister   . Diabetes Brother   . Colon cancer Maternal Aunt   . Cardiomyopathy Neg Hx     Social History: Social History   Social History  . Marital status: Single    Spouse name: N/A  . Number of children: N/A  . Years of education: N/A  Social History Main Topics  . Smoking status: Never Smoker  . Smokeless tobacco: Never Used  . Alcohol use No  . Drug use: No  . Sexual activity: Not Asked   Other Topics Concern  . None   Social History Narrative   Financial assistance approved for 100% discount at [facility] and has [facility] card   [employer]  March 16, 2010 9:42 AM      Lives with mother.  Has a girlfriend    Allergies:  Allergies  Allergen Reactions  . Beta Adrenergic Blockers Other (See Comments)    REACTION: A white colored Beta Blocker made him feel funny.    Objective:    Vital Signs:   Temp:  [98.4 F (36.9 C)-99 F (37.2 C)] 98.4 F (36.9 C) (08/14 1322) Pulse Rate:  [62-73] 66 (08/14 1322) Resp:  [16-20] 18 (08/14 1322) BP: (120-146)/(73-91) 127/76 (08/14 1322) SpO2:  [94 %-98 %] 98 % (08/14 1322) Weight:  [334 lb 11.2 oz (151.8 kg)-340 lb 3.2 oz (154.3 kg)] 334 lb 11.2 oz (151.8 kg) (08/14 0013) Last BM Date: 03/05/16  Weight change: Filed Weights   03/05/16 1837 03/06/16 0013  Weight: (!) 340 lb 3.2 oz (154.3 kg) (!) 334 lb 11.2 oz (151.8 kg)    Intake/Output:   Intake/Output Summary (Last 24 hours) at 03/06/16 1344 Last data filed at 03/06/16 1323  Gross per 24 hour  Intake              460 ml  Output             1250 ml  Net             -790 ml     Physical Exam: General:  Well appearing. No  resp difficulty HEENT: normal Neck: supple. JVP . Carotids 2+ bilat; no bruits. No lymphadenopathy or thyromegaly appreciated. Cor: PMI nondisplaced. Regular rate & rhythm. No rubs, gallops or murmurs. Lungs: clear Abdomen: soft, nontender, nondistended. No hepatosplenomegaly. No bruits or masses. Good bowel sounds. Extremities: no cyanosis, clubbing, rash, edema Neuro: alert & orientedx3, cranial nerves grossly intact. moves all 4 extremities w/o difficulty. Affect pleasant  Telemetry: Reviewed personally,  Atrial flutter in 70s  Labs: Basic Metabolic Panel:  Recent Labs Lab 02/29/16 0332 03/05/16 1845 03/06/16 0445 03/06/16 0950  NA 140 140 140  --   K 3.9 4.2 4.1  --   CL 98* 100* 102  --   CO2 35* 31 31  --   GLUCOSE 110* 118* 120*  --   BUN 37* 31* 32*  --   CREATININE 1.97* 2.19* 2.16*  --   CALCIUM 9.4 9.1 9.1  --   MG  --   --   --  2.5*    Liver Function Tests: No results for input(s): AST, ALT, ALKPHOS, BILITOT, PROT, ALBUMIN in the last 168 hours. No results for input(s): LIPASE, AMYLASE in the last 168 hours. No results for input(s): AMMONIA in the last 168 hours.  CBC:  Recent Labs Lab 03/05/16 1845  WBC 13.2*  HGB 12.0*  HCT 38.8*  MCV 91.9  PLT 227    Cardiac Enzymes:  Recent Labs Lab 02/28/16 2034  CKTOTAL 396    BNP: BNP (last 3 results)  Recent Labs  07/24/15 1422 08/04/15 0937 02/28/16 1234  BNP 354.7* 172.6* 327.5*    ProBNP (last 3 results) No results for input(s): PROBNP in the last 8760 hours.   CBG: No results for input(s): GLUCAP in the  last 168 hours.  Coagulation Studies: No results for input(s): LABPROT, INR in the last 72 hours.  Other results: EKG: AFL 130 bpm  Imaging: Dg Chest 2 View  Result Date: 03/05/2016 CLINICAL DATA:  Acute onset of generalized chest tightness, shortness of breath and dry cough. Initial encounter. EXAM: CHEST  2 VIEW COMPARISON:  Chest radiograph performed 02/28/2016 FINDINGS:  Vascular congestion is noted. Bibasilar airspace opacities may reflect mild interstitial edema, though pneumonia might have a similar appearance. No pleural effusion or pneumothorax is seen. The heart is borderline enlarged. An AICD is noted at the left chest wall, with a single lead ending at the right ventricle. No acute osseous abnormalities are identified. IMPRESSION: Vascular congestion and borderline cardiomegaly. Bibasilar airspace opacities may reflect mild interstitial edema, though pneumonia might have a similar appearance depending on the patient's symptoms. Electronically Signed   By: Roanna RaiderJeffery  Chang M.D.   On: 03/05/2016 19:15      Medications:     Current Medications: . allopurinol  100 mg Oral BID  . apixaban  5 mg Oral BID  . carvedilol  25 mg Oral BID WC  . furosemide  120 mg Intravenous BID  . isosorbide-hydrALAZINE  2 tablet Oral TID  . potassium chloride SA  20 mEq Oral BID  . sodium chloride flush  3 mL Intravenous Q12H  . spironolactone  25 mg Oral Daily     Infusions:      Assessment/Plan   Blanchard KelchJasper Baetz is a 55 y.o. male well known by the HF clinic who presents for the 2nd time in 2 weeks with A/C systolic HF.  Both times EKG have shown AFL. Cardiology not consulted first admission.   1. Acute on chronic systolic heart failure: Nonischemic cardiomyopathy, likely related to HTN.  Echo in 8/14 with EF 45% but EF down to 25-30% on TEE while in atrial flutter (03/2014).  Echo (1/16) with EF ~25% and echo in 6/16 with EF 30-35%.  No cardiac MRI done with elevated creatinine and size.  There was concern for cardiac amyloidosis.  However, negative SPEP and abdominal fat pad biopsy negative. Low voltage on ECG may be due to obesity and not amyloidosis. Would not proceed with endomyocardial biopsy at this time. NYHA II.  - He remains volume overloaded Continue IV lasix 120 mg BID for today.  - Continue Coreg 25 mg BID, spironolactone 25 mg daily and Bidil 2 tabs TID.   -  Repeat Echo pending - Will have [employer] interrogate device for length/frequency of AFL.  - Agree with fluid limit 1500 cc daily.   2. Recurrent A Flutter - EKG on this and previous admission show AFL.  - Full ICD interrogation for length/frequency.   - Has had previous successful DCCV. Unfortunately has missed dose of Eliquis (several) in past month to 6 weeks.  Would need TEE.   CHADSVASC = 2 (HTN, CHF).  - Continue eliquis 5 mg BID. Denies bleeding problems.  3. Low voltage on ECG: SPEP and fat pad biopsy negative. See above.  Consensus thus far is this is likely due to obesity rather than amyloidosis. 4. CKD III: BMET today  5. HTN - Relatively stable on current regiment.   6. OSA - Nightly CPAP.  7. Morbid Obesity 8. Gout: Stable.    IV diuresis as above.  Awaiting optivol interrogation for duration of Aflutter.  May need DCCV/TEE having missed doses of Eliquis.   ? Utility of amio.  It does not appear he  has been on in past.   Length of Stay: 1  Graciella Freer PA-C 03/06/2016, 1:44 PM  Advanced Heart Failure Team Pager [phone] (M-F; 7a - 4p)  Please contact [facility] for night-coverage after hours (4p -7a ) and weekends on [email or web address]  Patient seen with PA, agree with the above note.  1. Acute on chronic systolic CHF: Recent admission with CHF exacerbation, not seen by cardiology.  Some diuresis but back to hospital in less than 1 week.  Noted to be in atypical atrial flutter again during last admission, remains in atypical atrial flutter today.  Possible that CHF exacerbation triggered by atrial flutter (had been in NSR when last seen in my office), also suspect dietary indiscretion and inadequate diuresis last admission play a role.   - Agree with Lasix 120 mg IV bid for now, follow response.  - Continue current Coreg, Bidil, and spironolactone.  Off lisinopril with elevated creatinine. - Repeat echo.  2. Atypical atrial flutter: last episode was in 2015, he was  cardioverted.  Now with recurrent atypical flutter in setting of CHF exacerbation (was in flutter 1 week ago also at last admission). I think he needs to come out of atrial flutter.  Will have EP look at his ECG to see if this is ablatable.  If not, would plan TEE-guided DCCV after some diuresis, perhaps on Wednesday.  Will need TEE because he has missed some doses of Eliquis over the last month.  3. AKI on CKD stage III: Creatinine is a bit higher than his baseline. Will need to watch closely.   [name] 03/06/2016 3:50 PM

## 2016-03-06 NOTE — Telephone Encounter (Signed)
Patient was reviewed with Bea Graff, PharmD candidate. I agree with the assessment and plan of care documented.

## 2016-03-06 NOTE — Telephone Encounter (Signed)
Per medication dispenses patient has refilled following meds:  Apixaban 5 mg, refilled 8/1, quantity 60 Carvedilol 25 mg, refilled 7/21, quantity 60 Bidil 20-37.5 mg, refilled 6/26, quantity 180 Lisinopril 2.5 mg, refilled 7/6, quantity 30 Spironolactone 25 mg, refilled 8/2, quantity 30 Torsemide 20 mg, refilled 7/13, quantity 30 Potassium Chloride Crys CR, refilled 7/5, quantity 60 Metolazone -  Last filled 08/23/15, quantity 4 from [facility] [place] (rx on hold as of 7/6, but has not picked it up)   Bea Graff PharmD Candidate, c/o 2019 03/06/2016 11:18 AM

## 2016-03-06 NOTE — Progress Notes (Signed)
Patient refused bed alarm. Will continue to monitor patient. 

## 2016-03-06 NOTE — Progress Notes (Signed)
Nutrition Education Note  RD consulted for nutrition education regarding CHF.  Pt reports following a low sodium diet at home, but denies ever receiving formal diet education and is unable to report any aspects of the low sodium diet. RD provided "Heart Failure Nutrition Therapy" handout from the Academy of [employer] and [employer]etics. Reviewed patient's dietary recall. Provided examples on ways to decrease sodium intake in diet. Discouraged intake of processed foods and use of salt shaker. Encouraged fresh fruits and vegetables as well as whole grain sources of carbohydrates to maximize fiber intake.   RD discussed why it is important for patient to adhere to diet recommendations, and emphasized the role of fluids, foods to avoid, and importance of weighing self daily. Teach back method used.  Expect good compliance. Pt states that he likes the food here and thinks it has lots of flavor. Goals for home are to buy frozen/low sodium green beans instead of regular canned green beans, to eat swiss cheese instead of american cheese, and to more closely monitor his fluid intake as well as his intake of watermelon.   Body mass index is 49.4 kg/m. Pt meets criteria for Morbid Obesity based on current BMI.  Current diet order is Heart healthy, patient is consuming approximately 100% of meals at this time. Labs and medications reviewed. No further nutrition interventions warranted at this time. RD contact information provided. If additional nutrition issues arise, please re-consult RD.   Dorothea Ogle RD, LDN Inpatient Clinical Dietitian Pager: [phone] After Hours Pager: [phone]

## 2016-03-06 NOTE — Progress Notes (Signed)
   Subjective: Patient returns to hospital with worsening shortness of breath despite reported compliance to medicines. He does report he may have probably taken in more than 1.5L of fluid. Of note, patient's brother passed away on day of admission in [place].   Patient states he is still having some shortness of breath and swelling in his legs, but no chest pain. He states he has not checked his weight at home since discharge. He states that since starting the IV lasix, he has not had much urine output.   Objective:  Vital signs in last 24 hours: Vitals:   03/05/16 2359 03/06/16 0013 03/06/16 0021 03/06/16 0450  BP:   131/74 136/73  Pulse:   64 73  Resp:   18 20  Temp: 98.6 F (37 C)  99 F (37.2 C) 98.5 F (36.9 C)  TempSrc: Oral  Oral Oral  SpO2:   98% 95%  Weight:  (!) 151.8 kg (334 lb 11.2 oz)    Height:  5' 9.02" (1.753 m)     Constitutional: sitting in chair, leaning over tray, appears mildly uncomfortable. CV: RRR, without murmurs, rubs or gallops Resp: mild L basilar crackles, no wheezing, no increased work of breathing Abd: Obese, soft, non tender Ext: Cool, 2+ pulses throughout, 1+ pitting edema  Assessment/Plan:  Principal Problem:   Acute on chronic systolic heart failure (HCC) Active Problems:   Hyperlipidemia   Morbid obesity with BMI of 45.0-49.9, adult (HCC)   CKD (chronic kidney disease) stage 3, GFR 30-59 ml/min   Cardiomyopathy, nonischemic (HCC)   OSA (obstructive sleep apnea)   Atrial flutter (HCC)   Anemia of chronic disease   Gout  Acute on Chronic CHF with reduced EF: Patient with history of CHF with non-ischemic cardiomyopathy, EF 30-35%, with ICD in placepresented with increasing shortness of breath for two days with reported medication compliance, but possible nonadherence to fluid restriction. Patient's estimated dry weight is around 325-330lbs, discharge weight and current weight are 334lb. At home is is on Bidil 20-37.5mg  2 tabs TID,  spironolactone 25mg  daily, torsemide 60mg  AM and 40mg  PM, and coreg 25mg  bid. His last ECHO was in June 2016 showing EF 30-35%, last RHC/LHC were in 10/15 with no significant coronary disease. EKG unchanged, CXR shows borderline cardiomegaly (unchanged) with vascular congestion. Due to multiple recent hospitalizations for CHF exacerbations, it would be prudent to repeat ECHO to evaluate change in heart function which may be causing his exacerbations. --IV Lasix 120mg  BID, one time dose of metolazone 5mg  --Strict I&O's with daily weights --telemetry  --continue home medication of  Bidil 20-37.5mg  2 tabs TID, spironolactone 25mg  daily, and coreg 25mg  bid; holding lisinopril  Atrial Flutter: Patient in atrial flutter, on Eliquis --Continue Eliquis 5mg  bid, coreg 25mg  --Tele  Chronic Kidney Disease, stage III: Creatinine at 2.16 on admission (baseline ~1.8, Cr on recent discharge 1.97), GFR 38. Slight elevation in Cr likely due to fluid overload, but will monitor. --hold lisinopril --monitor  Gout: Patient with a history of gout on chronic allopurinol therapy. --Continue home allopurinol 100mg  daily  Dispo: Anticipated discharge in approximately 1-2 day(s).   Nyra Market, MD 03/06/2016, 9:51 AM Pager: [phone]

## 2016-03-07 ENCOUNTER — Inpatient Hospital Stay (HOSPITAL_COMMUNITY): Payer: Medicaid Other

## 2016-03-07 DIAGNOSIS — G4733 Obstructive sleep apnea (adult) (pediatric): Secondary | ICD-10-CM

## 2016-03-07 DIAGNOSIS — I509 Heart failure, unspecified: Secondary | ICD-10-CM

## 2016-03-07 DIAGNOSIS — I4892 Unspecified atrial flutter: Secondary | ICD-10-CM

## 2016-03-07 DIAGNOSIS — Z9989 Dependence on other enabling machines and devices: Secondary | ICD-10-CM

## 2016-03-07 DIAGNOSIS — R9431 Abnormal electrocardiogram [ECG] [EKG]: Secondary | ICD-10-CM

## 2016-03-07 LAB — BASIC METABOLIC PANEL
ANION GAP: 10 (ref 5–15)
ANION GAP: 9 (ref 5–15)
ANION GAP: 10 (ref 5–15)
BUN: 33 mg/dL — AB (ref 6–20)
BUN: 33 mg/dL — ABNORMAL HIGH (ref 6–20)
BUN: 35 mg/dL — ABNORMAL HIGH (ref 6–20)
CALCIUM: 9.3 mg/dL (ref 8.9–10.3)
CALCIUM: 9.2 mg/dL (ref 8.9–10.3)
CO2: 31 mmol/L (ref 22–32)
CO2: 32 mmol/L (ref 22–32)
CO2: 30 mmol/L (ref 22–32)
Calcium: 9 mg/dL (ref 8.9–10.3)
Chloride: 98 mmol/L — ABNORMAL LOW (ref 101–111)
Chloride: 99 mmol/L — ABNORMAL LOW (ref 101–111)
Chloride: 97 mmol/L — ABNORMAL LOW (ref 101–111)
Creatinine, Ser: 2.14 mg/dL — ABNORMAL HIGH (ref 0.61–1.24)
Creatinine, Ser: 2.05 mg/dL — ABNORMAL HIGH (ref 0.61–1.24)
Creatinine, Ser: 2.12 mg/dL — ABNORMAL HIGH (ref 0.61–1.24)
GFR calc Af Amer: 38 mL/min — ABNORMAL LOW (ref >60)
GFR, EST AFRICAN AMERICAN: 40 mL/min — AB (ref >60)
GFR, EST AFRICAN AMERICAN: 39 mL/min — AB (ref >60)
GFR, EST NON AFRICAN AMERICAN: 33 mL/min — AB (ref >60)
GFR, EST NON AFRICAN AMERICAN: 35 mL/min — AB (ref >60)
GFR, EST NON AFRICAN AMERICAN: 33 mL/min — AB (ref >60)
GLUCOSE: 108 mg/dL — AB (ref 65–99)
Glucose, Bld: 196 mg/dL — ABNORMAL HIGH (ref 65–99)
Glucose, Bld: 161 mg/dL — ABNORMAL HIGH (ref 65–99)
POTASSIUM: 3.7 mmol/L (ref 3.5–5.1)
Potassium: 4 mmol/L (ref 3.5–5.1)
Potassium: 3.9 mmol/L (ref 3.5–5.1)
SODIUM: 137 mmol/L (ref 135–145)
Sodium: 139 mmol/L (ref 135–145)
Sodium: 140 mmol/L (ref 135–145)

## 2016-03-07 LAB — CUP PACEART REMOTE DEVICE CHECK
HIGH POWER IMPEDANCE MEASURED VALUE: 79 Ohm
HighPow Impedance: 79 Ohm
Implantable Lead Implant Date: 20161028
Implantable Lead Location: 753860
Lead Channel Pacing Threshold Pulse Width: 0.5 ms
Lead Channel Setting Pacing Amplitude: 2.5 V
Lead Channel Setting Pacing Pulse Width: 0.5 ms
MDC IDC LEAD MODEL: 181
MDC IDC LEAD SERIAL: 332395
MDC IDC MSMT BATTERY REMAINING LONGEVITY: 94 mo
MDC IDC MSMT BATTERY REMAINING PERCENTAGE: 92 %
MDC IDC MSMT BATTERY VOLTAGE: 3.17 V
MDC IDC MSMT LEADCHNL RV IMPEDANCE VALUE: 440 Ohm
MDC IDC MSMT LEADCHNL RV PACING THRESHOLD AMPLITUDE: 0.75 V
MDC IDC MSMT LEADCHNL RV SENSING INTR AMPL: 12 mV
MDC IDC SESS DTM: 20170809033957
MDC IDC SET LEADCHNL RV SENSING SENSITIVITY: 0.5 mV
MDC IDC STAT BRADY RV PERCENT PACED: 1 %
Pulse Gen Serial Number: 7305918

## 2016-03-07 LAB — ECHOCARDIOGRAM COMPLETE
HEIGHTINCHES: 69.016 in
Weight: 5318.4 oz

## 2016-03-07 LAB — MAGNESIUM: Magnesium: 2.4 mg/dL (ref 1.7–2.4)

## 2016-03-07 MED ORDER — SODIUM CHLORIDE 0.9% FLUSH
3.0000 mL | Freq: Two times a day (BID) | INTRAVENOUS | Status: DC
  Administered 2016-03-07 – 2016-03-09 (×3): 3 mL via INTRAVENOUS

## 2016-03-07 MED ORDER — SODIUM CHLORIDE 0.9% FLUSH
3.0000 mL | Freq: Two times a day (BID) | INTRAVENOUS | Status: DC
  Administered 2016-03-09 – 2016-03-10 (×2): 3 mL via INTRAVENOUS

## 2016-03-07 MED ORDER — SODIUM CHLORIDE 0.9 % IV SOLN: 250.0000 mL | INTRAVENOUS | Status: DC

## 2016-03-07 MED ORDER — SODIUM CHLORIDE 0.9% FLUSH: 3.0000 mL | INTRAVENOUS | Status: DC | PRN

## 2016-03-07 MED ORDER — POTASSIUM CHLORIDE 10 MEQ/100ML IV SOLN
10.0000 meq | INTRAVENOUS | Status: AC
  Administered 2016-03-07: 10 meq via INTRAVENOUS

## 2016-03-07 MED ORDER — POTASSIUM CHLORIDE CRYS ER 20 MEQ PO TBCR
40.0000 meq | EXTENDED_RELEASE_TABLET | Freq: Two times a day (BID) | ORAL | Status: DC
  Administered 2016-03-07 – 2016-03-09 (×5): 40 meq via ORAL
  Filled 2016-03-07 (×6): qty 2

## 2016-03-07 MED ORDER — SODIUM CHLORIDE 0.9% FLUSH
3.0000 mL | INTRAVENOUS | Status: DC | PRN
Start: 2016-03-07 — End: 2016-03-10

## 2016-03-07 MED ORDER — POTASSIUM CHLORIDE 10 MEQ/100ML IV SOLN
10.0000 meq | INTRAVENOUS | Status: AC
  Administered 2016-03-07: 10 meq via INTRAVENOUS
  Filled 2016-03-07 (×2): qty 100

## 2016-03-07 NOTE — Progress Notes (Signed)
Patient complaining of cramps in right side of abdomen which is also causing nausea. Paged MD to make them aware. Awaiting orders. Will continue to monitor.

## 2016-03-07 NOTE — Progress Notes (Signed)
  Echocardiogram 2D Echocardiogram has been performed.  Eric Murillo 03/07/2016, 1:31 PM
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
## 2016-03-07 NOTE — Progress Notes (Signed)
Subjective: Patient denies shortness of breath, chest pain, or palpitations. He states that talking with the nutritionist yesterday helped him get more ideas on what kinds of changes to his diet he needs to make to limit his fluid and sodium intake.   Patient has a diagnosis of OSA with CPAP at home that he states he's not using regularly due to not being confident he is using it correctly.   Objective:  Vital signs in last 24 hours: Vitals:   03/06/16 2252 03/07/16 0025 03/07/16 0630 03/07/16 0745  BP: 131/66 117/62 (!) 111/55 116/68  Pulse: 67 75 77 82  Resp: 18 18 18 18   Temp: 98.7 F (37.1 C) 98.5 F (36.9 C) 98.5 F (36.9 C) 98.7 F (37.1 C)  TempSrc: Oral Oral Oral Oral  SpO2: 97% 97% 97% 97%  Weight:   (!) 150.8 kg (332 lb 6.4 oz)   Height:       Constitutional: sitting in chair, leaning over tray, appears comfortable CV: irregularly irregular rhythm, without murmurs, rubs or gallops Resp: mild L basilar crackles, no wheezing, no increased work of breathing Abd: Obese, soft, non tender Ext: Cool, 2+ pulses throughout, 1+ pitting edema  Assessment/Plan:  Principal Problem:   Acute on chronic systolic heart failure (HCC) Active Problems:   Hyperlipidemia   Morbid obesity with BMI of 45.0-49.9, adult (HCC)   CKD (chronic kidney disease) stage 3, GFR 30-59 ml/min   Cardiomyopathy, nonischemic (HCC)   OSA (obstructive sleep apnea)   Atrial flutter (HCC)   Anemia of chronic disease   Gout   AKI (acute kidney injury) (HCC)  Acute on Chronic CHF with reduced EF: Patient with history of CHF with non-ischemic cardiomyopathy, EF 30-35%, with ICD in placepresented with increasing shortness of breath for two days with reported medication compliance, but possible nonadherence to fluid restriction. Patient's estimated dry weight is around 325-330lbs, discharge weight and current weight are 334lb. At home is is on Bidil 20-37.5mg  2 tabs TID, spironolactone 25mg  daily, torsemide  60mg  AM and 40mg  PM, and coreg 25mg  bid. His last ECHO was in June 2016 showing EF 30-35%, last RHC/LHC were in 10/15 with no significant coronary disease. EKG unchanged, CXR shows borderline cardiomegaly (unchanged) with vascular congestion. Due to multiple recent hospitalizations for CHF exacerbations, it would be prudent to repeat ECHO to evaluate change in heart function which may be causing his exacerbations. His atrial flutter may also contribute to his exacerbations so will be evaluated for possible ablation or DCCV. Dropped 1kg in weight. --IV Lasix 120mg  BID --ECHO pending, ICD interrogation, TEE-guided DCCV if ablation (w/TEE) is not possible --Strict I&O's with daily weights --telemetry  --continue home medication of  Bidil 20-37.5mg  2 tabs TID, spironolactone 25mg  daily, and coreg 25mg  bid; holding lisinopril  Atrial Flutter: Patient in atrial flutter, on Eliquis --Continue Eliquis 5mg  bid, coreg 25mg  --Tele  Chronic Kidney Disease, stage III: Creatinine at 2.16 on admission (baseline ~1.8, Cr on recent discharge 1.97), GFR 38. Slight elevation in Cr likely due to fluid overload, but will monitor. Currently 2.05, stable. --hold lisinopril --monitor  Gout: Patient with a history of gout on chronic allopurinol therapy. --Continue home allopurinol 100mg  daily  OSA: Patient with history of OSA, supposed to be on CPAP at night. He has a CPAP machine but irregularly uses it. --Provide CPAP while inpatient with education of proper use. --Plan for outpatient titration of CPAP  Dispo: Anticipated discharge in approximately 1-2 day(s).   Nyra Market, MD 03/07/2016, 8:31  AM Pager: [phone]

## 2016-03-07 NOTE — Progress Notes (Signed)
Patient ID: Eric Murillo, male   DOB: Sep 07, 1960, 55 y.o.   MRN: [id number]    SUBJECTIVE: Diuresing well, weight down.  Remains in atypical flutter.   Scheduled Meds: . allopurinol  100 mg Oral BID  . apixaban  5 mg Oral BID  . carvedilol  25 mg Oral BID WC  . furosemide  120 mg Intravenous BID  . isosorbide-hydrALAZINE  2 tablet Oral TID  . potassium chloride  10 mEq Intravenous Q1 Hr x 2  . potassium chloride SA  40 mEq Oral BID  . sodium chloride flush  3 mL Intravenous Q12H  . spironolactone  25 mg Oral Daily   Continuous Infusions:  PRN Meds:.acetaminophen **OR** acetaminophen, oxyCODONE-acetaminophen **AND** oxyCODONE    Vitals:   03/06/16 2252 03/07/16 0025 03/07/16 0630 03/07/16 0745  BP: 131/66 117/62 (!) 111/55 116/68  Pulse: 67 75 77 82  Resp: 18 18 18 18   Temp: 98.7 F (37.1 C) 98.5 F (36.9 C) 98.5 F (36.9 C) 98.7 F (37.1 C)  TempSrc: Oral Oral Oral Oral  SpO2: 97% 97% 97% 97%  Weight:   (!) 332 lb 6.4 oz (150.8 kg)   Height:        Intake/Output Summary (Last 24 hours) at 03/07/16 0815 Last data filed at 03/07/16 0743  Gross per 24 hour  Intake              816 ml  Output             3300 ml  Net            -2484 ml    LABS: Basic Metabolic Panel:  Recent Labs  34/74/25 0950 03/06/16 1850 03/07/16 0403  NA  --  138 139  K  --  3.8 3.7  CL  --  101 98*  CO2  --  29 31  GLUCOSE  --  149* 108*  BUN  --  34* 33*  CREATININE  --  2.12* 2.14*  CALCIUM  --  9.0 9.0  MG 2.5*  --  2.4   Liver Function Tests: No results for input(s): AST, ALT, ALKPHOS, BILITOT, PROT, ALBUMIN in the last 72 hours. No results for input(s): LIPASE, AMYLASE in the last 72 hours. CBC:  Recent Labs  03/05/16 1845  WBC 13.2*  HGB 12.0*  HCT 38.8*  MCV 91.9  PLT 227   Cardiac Enzymes: No results for input(s): CKTOTAL, CKMB, CKMBINDEX, TROPONINI in the last 72 hours. BNP: Invalid input(s): POCBNP D-Dimer: No results for input(s): DDIMER in the last 72  hours. Hemoglobin A1C: No results for input(s): HGBA1C in the last 72 hours. Fasting Lipid Panel: No results for input(s): CHOL, HDL, LDLCALC, TRIG, CHOLHDL, LDLDIRECT in the last 72 hours. Thyroid Function Tests: No results for input(s): TSH, T4TOTAL, T3FREE, THYROIDAB in the last 72 hours.  Invalid input(s): FREET3 Anemia Panel: No results for input(s): VITAMINB12, FOLATE, FERRITIN, TIBC, IRON, RETICCTPCT in the last 72 hours.  RADIOLOGY: Dg Chest 2 View  Result Date: 03/05/2016 CLINICAL DATA:  Acute onset of generalized chest tightness, shortness of breath and dry cough. Initial encounter. EXAM: CHEST  2 VIEW COMPARISON:  Chest radiograph performed 02/28/2016 FINDINGS: Vascular congestion is noted. Bibasilar airspace opacities may reflect mild interstitial edema, though pneumonia might have a similar appearance. No pleural effusion or pneumothorax is seen. The heart is borderline enlarged. An AICD is noted at the left chest wall, with a single lead ending at the right ventricle. No acute osseous abnormalities  are identified. IMPRESSION: Vascular congestion and borderline cardiomegaly. Bibasilar airspace opacities may reflect mild interstitial edema, though pneumonia might have a similar appearance depending on the patient's symptoms. Electronically Signed   By: Roanna RaiderJeffery  Chang M.D.   On: 03/05/2016 19:15   Dg Chest 2 View  Result Date: 02/28/2016 CLINICAL DATA:  Shortness of breath for 5 days. EXAM: CHEST  2 VIEW COMPARISON:  07/24/2015 FINDINGS: Asymmetric elevation right hemidiaphragm is stable. The cardio pericardial silhouette is enlarged. There is pulmonary vascular congestion without overt pulmonary edema. Left pacer/AICD remains in place. The visualized bony structures of the thorax are intact. IMPRESSION: stable. Cardiomegaly with vascular congestion. No acute cardiopulmonary findings. Electronically Signed   By: Kennith CenterEric  Mansell M.D.   On: 02/28/2016 13:18    PHYSICAL EXAM General:  NAD Neck: JVP 10-12 cm, no thyromegaly or thyroid nodule.  Lungs: Clear to auscultation bilaterally with normal respiratory effort. CV: Nondisplaced PMI.  Heart irregular S1/S2, no S3/S4, no murmur.  1+ edema to knees bilaterally.   Abdomen: Soft, nontender, no hepatosplenomegaly, no distention.  Neurologic: Alert and oriented x 3.  Psych: Normal affect. Extremities: No clubbing or cyanosis.   TELEMETRY: Reviewed telemetry pt in atypical atrial flutter  ASSESSMENT AND PLAN:  Eric KelchJasper Murillo is a 55 y.o. male well known by the HF clinic who presents for the 2nd time in 2 weeks with A/C systolic HF.  Both times EKG have shown AFL. Cardiology not consulted first admission.   1. Acute on chronic systolic heart failure: Nonischemic cardiomyopathy, likely related to HTN. St Jude ICD.  Echo in 8/14 with EF 45% but EF down to 25-30% on TEE while in atrial flutter (03/2014). Echo (1/16) with EF ~25% and echo in 6/16 with EF 30-35%. No cardiac MRI done with elevated creatinine and size. There was concern for cardiac amyloidosis. However, negative SPEP and abdominal fat pad biopsy negative. Low voltage on ECG may be due to obesity and not amyloidosis.  Possible that CHF exacerbation triggered by atrial flutter (had been in NSR when last seen in my office), also suspect dietary indiscretion and inadequate diuresis last admission play a role.  He has diuresed well so far, weight down.  Still some volume overload on exam.  Creatinine stable.  - Continue IV lasix 120 mg BID for today.  - Continue Coreg 25 mg BID, spironolactone 25 mg daily and Bidil 2 tabs TID.  - Repeat Echo pending - Agree with fluid limit 1500 cc daily.  2. Recurrent A Flutter Atypical atrial flutter: last episode was in 2015, he was cardioverted.  Now with recurrent atypical flutter in setting of CHF exacerbation (was in flutter 1 week ago also at last admission). I think he needs to come out of atrial flutter.  Will have EP look at  his ECG to see if this is ablatable.  If not, would plan TEE-guided DCCV probably tomorrow after further diuresis.  Will need TEE because he has missed some doses of Eliquis over the last month.  3. Low voltage on ECG  SPEP and fat pad biopsy negative. See above. Consensus thus far is this is likely due to obesity rather than amyloidosis. 4. CKDIII  Creatinine stable at 2.1 today.  5. HTN Relatively stable on current regiment.   6. OSA Nightly CPAP.  7. Morbid Obesity 8. Gout:Stable.   Eric Murillo 03/07/2016 8:21 AM

## 2016-03-08 ENCOUNTER — Encounter: Payer: Medicaid Other | Admitting: Internal Medicine

## 2016-03-08 LAB — BASIC METABOLIC PANEL
ANION GAP: 10 (ref 5–15)
ANION GAP: 11 (ref 5–15)
BUN: 36 mg/dL — ABNORMAL HIGH (ref 6–20)
BUN: 37 mg/dL — ABNORMAL HIGH (ref 6–20)
CALCIUM: 9.5 mg/dL (ref 8.9–10.3)
CHLORIDE: 96 mmol/L — AB (ref 101–111)
CHLORIDE: 96 mmol/L — AB (ref 101–111)
CO2: 35 mmol/L — AB (ref 22–32)
CO2: 33 mmol/L — AB (ref 22–32)
CREATININE: 2.11 mg/dL — AB (ref 0.61–1.24)
Calcium: 9.6 mg/dL (ref 8.9–10.3)
Creatinine, Ser: 2.17 mg/dL — ABNORMAL HIGH (ref 0.61–1.24)
GFR calc Af Amer: 38 mL/min — ABNORMAL LOW (ref >60)
GFR calc non Af Amer: 34 mL/min — ABNORMAL LOW (ref >60)
GFR calc non Af Amer: 32 mL/min — ABNORMAL LOW (ref >60)
GFR, EST AFRICAN AMERICAN: 39 mL/min — AB (ref >60)
GLUCOSE: 135 mg/dL — AB (ref 65–99)
Glucose, Bld: 112 mg/dL — ABNORMAL HIGH (ref 65–99)
POTASSIUM: 3.8 mmol/L (ref 3.5–5.1)
Potassium: 3.8 mmol/L (ref 3.5–5.1)
SODIUM: 141 mmol/L (ref 135–145)
Sodium: 140 mmol/L (ref 135–145)

## 2016-03-08 LAB — CBC
HEMATOCRIT: 40.4 % (ref 39.0–52.0)
HEMOGLOBIN: 12.6 g/dL — AB (ref 13.0–17.0)
MCH: 28.6 pg (ref 26.0–34.0)
MCHC: 31.2 g/dL (ref 30.0–36.0)
MCV: 91.6 fL (ref 78.0–100.0)
Platelets: 260 10*3/uL (ref 150–400)
RBC: 4.41 MIL/uL (ref 4.22–5.81)
RDW: 13.9 % (ref 11.5–15.5)
WBC: 14 10*3/uL — AB (ref 4.0–10.5)

## 2016-03-08 MED ORDER — SODIUM CHLORIDE 0.9% FLUSH
3.0000 mL | INTRAVENOUS | Status: DC | PRN
Start: 2016-03-08 — End: 2016-03-10

## 2016-03-08 MED ORDER — SODIUM CHLORIDE 0.9% FLUSH
3.0000 mL | Freq: Two times a day (BID) | INTRAVENOUS | Status: DC
  Administered 2016-03-10: 3 mL via INTRAVENOUS

## 2016-03-08 MED ORDER — SODIUM CHLORIDE 0.9 % IV SOLN
INTRAVENOUS | Status: DC
  Administered 2016-03-09: 09:00:00 via INTRAVENOUS

## 2016-03-08 MED ORDER — AMIODARONE HCL 200 MG PO TABS
200.0000 mg | ORAL_TABLET | Freq: Two times a day (BID) | ORAL | Status: DC
  Administered 2016-03-08 – 2016-03-10 (×3): 200 mg via ORAL
  Filled 2016-03-08 (×5): qty 1

## 2016-03-08 MED ORDER — SODIUM CHLORIDE 0.9 % IV SOLN: 250.0000 mL | INTRAVENOUS | Status: DC

## 2016-03-08 MED ORDER — SODIUM CHLORIDE 0.9 % IV SOLN
INTRAVENOUS | Status: DC
  Administered 2016-03-08 – 2016-03-09 (×2): via INTRAVENOUS

## 2016-03-08 NOTE — Progress Notes (Signed)
Subjective: Patient states he has not had any shortness of breath or chest pain. He feels that he has gotten rid of a lot of fluid but still is overloaded a little bit. He was not successful with keeping his CPAP on for long last night, but will try again today. He is in better spirits today. Agrees to the plan of DCCV and is looking forward to resolution of his a flutter if possible.   Objective:  Vital signs in last 24 hours: Vitals:   03/07/16 1225 03/07/16 2035 03/08/16 0506 03/08/16 1243  BP: (!) 97/55 (!) 104/42 (!) 146/86 (!) 143/69  Pulse: 83 69 79 84  Resp: 18 20 18 20   Temp:  97.9 F (36.6 C) 99.1 F (37.3 C) 99 F (37.2 C)  TempSrc:  Oral Oral Oral  SpO2: 95% 97% 94% 93%  Weight:   (!) 148.9 kg (328 lb 3.2 oz)   Height:       Constitutional: sitting in chair, leaning over tray, appears comfortable CV: irregularly irregular rhythm, without murmurs, rubs or gallops Resp: mild L basilar crackles, no wheezing, no increased work of breathing Abd: Obese, soft, non tender Ext: Cool, weak pulses throughout, 1+ pitting edema  Assessment/Plan:  Principal Problem:   Acute on chronic systolic heart failure (HCC) Active Problems:   Hyperlipidemia   Morbid obesity with BMI of 45.0-49.9, adult (HCC)   CKD (chronic kidney disease) stage 3, GFR 30-59 ml/min   Cardiomyopathy, nonischemic (HCC)   OSA (obstructive sleep apnea)   Atrial flutter (HCC)   Anemia of chronic disease   Gout   AKI (acute kidney injury) (HCC)  Acute on Chronic CHF with reduced EF: Patient with history of CHF with non-ischemic cardiomyopathy, EF 30-35%, with ICD in placepresented with increasing shortness of breath for two days with reported medication compliance, but possible nonadherence to fluid restriction. Patient's estimated dry weight is around 325-330lbs, discharge weight and current weight are 334lb. At home is is on Bidil 20-37.5mg  2 tabs TID, spironolactone 25mg  daily, torsemide 60mg  AM and 40mg   PM, and coreg 25mg  bid. His last ECHO was in June 2016 showing EF 30-35%, last RHC/LHC were in 10/15 with no significant coronary disease. EKG unchanged, CXR shows borderline cardiomegaly (unchanged) with vascular congestion. Due to multiple recent hospitalizations for CHF exacerbations we will pursue further cardiac workup. His ECHO showed a decrease in EF to 20-25% (from 30-35% one year prior), and a significantly dilated L atrium. Thus ablation will not be pursued at this time, and a TEE-guided DCCV will be done tomorrow with addition of amiodarone. Dropped 2kg in weight since yesterday --IV Lasix 120mg  BID; will transition to PO torsemide 60mg  bid tomorrow --Strict I&O's with daily weights --telemetry  --continue home medication of  Bidil 20-37.5mg  2 tabs TID, spironolactone 25mg  daily, and coreg 25mg  bid; holding lisinopril  Atrial Flutter: Patient in atrial flutter, on Eliquis with some missed doses recently. Currently not a candidate for ablation due to extent of LA dilation.  --Continue Eliquis 5mg  bid, coreg 25mg  --Tele --TEE-guided DCCV and amiodarone 8/17  Chronic Kidney Disease, stage III: Creatinine at 2.16 on admission (baseline ~1.8, Cr on recent discharge 1.97), GFR 38. Slight elevation in Cr likely due to fluid overload, but will monitor. Currently 2.17, stable. --hold lisinopril --monitor  Gout: Patient with a history of gout on chronic allopurinol therapy. --Continue home allopurinol 100mg  daily  OSA: Patient with history of OSA, supposed to be on CPAP at night. He has a CPAP  machine but irregularly uses it. --Provide CPAP while inpatient with education of proper use. --Plan for outpatient titration of CPAP  Dispo: Anticipated discharge in approximately 1-2 day(s).   Nyra Market, MD 03/08/2016, 3:52 PM Pager: [phone]

## 2016-03-08 NOTE — Progress Notes (Signed)
Patient ID: Eric Murillo, male   DOB: October 17, 1960, 55 y.o.   MRN: [id number]    SUBJECTIVE:     Out 1.7 L and down 4 lbs. Creatinine relatively stable.   Scheduled Meds: . allopurinol  100 mg Oral BID  . apixaban  5 mg Oral BID  . carvedilol  25 mg Oral BID WC  . furosemide  120 mg Intravenous BID  . isosorbide-hydrALAZINE  2 tablet Oral TID  . potassium chloride SA  40 mEq Oral BID  . sodium chloride flush  3 mL Intravenous Q12H  . sodium chloride flush  3 mL Intravenous Q12H  . sodium chloride flush  3 mL Intravenous Q12H  . spironolactone  25 mg Oral Daily   Continuous Infusions: . sodium chloride    . sodium chloride     PRN Meds:.acetaminophen **OR** acetaminophen, oxyCODONE-acetaminophen **AND** oxyCODONE, sodium chloride flush, sodium chloride flush    Vitals:   03/07/16 0745 03/07/16 1225 03/07/16 2035 03/08/16 0506  BP: 116/68 (!) 97/55 (!) 104/42 (!) 146/86  Pulse: 82 83 69 79  Resp: 18 18 20 18   Temp: 98.7 F (37.1 C)  97.9 F (36.6 C) 99.1 F (37.3 C)  TempSrc: Oral  Oral Oral  SpO2: 97% 95% 97% 94%  Weight:    (!) 328 lb 3.2 oz (148.9 kg)  Height:        Intake/Output Summary (Last 24 hours) at 03/08/16 1140 Last data filed at 03/08/16 1109  Gross per 24 hour  Intake              720 ml  Output             3740 ml  Net            -3020 ml    LABS: Basic Metabolic Panel:  Recent Labs  21/30/86 0950  03/07/16 0403  03/08/16 0421 03/08/16 0812  NA  --   < > 139  < > 141 140  K  --   < > 3.7  < > 3.8 3.8  CL  --   < > 98*  < > 96* 96*  CO2  --   < > 31  < > 35* 33*  GLUCOSE  --   < > 108*  < > 112* 135*  BUN  --   < > 33*  < > 36* 37*  CREATININE  --   < > 2.14*  < > 2.11* 2.17*  CALCIUM  --   < > 9.0  < > 9.6 9.5  MG 2.5*  --  2.4  --   --   --   < > = values in this interval not displayed. Liver Function Tests: No results for input(s): AST, ALT, ALKPHOS, BILITOT, PROT, ALBUMIN in the last 72 hours. No results for input(s): LIPASE,  AMYLASE in the last 72 hours. CBC:  Recent Labs  03/05/16 1845 03/08/16 0421  WBC 13.2* 14.0*  HGB 12.0* 12.6*  HCT 38.8* 40.4  MCV 91.9 91.6  PLT 227 260   Cardiac Enzymes: No results for input(s): CKTOTAL, CKMB, CKMBINDEX, TROPONINI in the last 72 hours. BNP: Invalid input(s): POCBNP D-Dimer: No results for input(s): DDIMER in the last 72 hours. Hemoglobin A1C: No results for input(s): HGBA1C in the last 72 hours. Fasting Lipid Panel: No results for input(s): CHOL, HDL, LDLCALC, TRIG, CHOLHDL, LDLDIRECT in the last 72 hours. Thyroid Function Tests: No results for input(s): TSH, T4TOTAL, T3FREE, THYROIDAB in the last 72  hours.  Invalid input(s): FREET3 Anemia Panel: No results for input(s): VITAMINB12, FOLATE, FERRITIN, TIBC, IRON, RETICCTPCT in the last 72 hours.  RADIOLOGY: Dg Chest 2 View  Result Date: 03/05/2016 CLINICAL DATA:  Acute onset of generalized chest tightness, shortness of breath and dry cough. Initial encounter. EXAM: CHEST  2 VIEW COMPARISON:  Chest radiograph performed 02/28/2016 FINDINGS: Vascular congestion is noted. Bibasilar airspace opacities may reflect mild interstitial edema, though pneumonia might have a similar appearance. No pleural effusion or pneumothorax is seen. The heart is borderline enlarged. An AICD is noted at the left chest wall, with a single lead ending at the right ventricle. No acute osseous abnormalities are identified. IMPRESSION: Vascular congestion and borderline cardiomegaly. Bibasilar airspace opacities may reflect mild interstitial edema, though pneumonia might have a similar appearance depending on the patient's symptoms. Electronically Signed   By: Roanna RaiderJeffery  Chang M.D.   On: 03/05/2016 19:15   Dg Chest 2 View  Result Date: 02/28/2016 CLINICAL DATA:  Shortness of breath for 5 days. EXAM: CHEST  2 VIEW COMPARISON:  07/24/2015 FINDINGS: Asymmetric elevation right hemidiaphragm is stable. The cardio pericardial silhouette is  enlarged. There is pulmonary vascular congestion without overt pulmonary edema. Left pacer/AICD remains in place. The visualized bony structures of the thorax are intact. IMPRESSION: stable. Cardiomegaly with vascular congestion. No acute cardiopulmonary findings. Electronically Signed   By: Kennith CenterEric  Mansell M.D.   On: 02/28/2016 13:18    PHYSICAL EXAM General: NAD Neck: JVP 10-12 cm, no thyromegaly or thyroid nodule.  Lungs: CTAB, normal effort.  CV: Nondisplaced PMI.  Heart irregular S1/S2, no S3/S4, no murmur.  1+ edema to knees bilaterally.   Abdomen: Soft, nontender, no hepatosplenomegaly, no distention.  Neurologic: Alert and oriented x 3.  Psych: Normal affect. Extremities: No clubbing or cyanosis.   TELEMETRY: Reviewed personally, he remains in AFL/atypical.  ASSESSMENT AND PLAN:  Eric Murillo is a 55 y.o. male well known by the HF clinic who presents for the 2nd time in 2 weeks with A/C systolic HF.  Both times EKG have shown AFL. Cardiology not consulted first admission.   1. Acute on chronic systolic heart failure: Nonischemic cardiomyopathy, likely related to HTN. St Jude ICD.  Echo in 8/14 with EF 45% but EF down to 25-30% on TEE while in atrial flutter (03/2014). Echo (1/16) with EF ~25% and echo in 6/16 with EF 30-35%. No cardiac MRI done with elevated creatinine and size. There was concern for cardiac amyloidosis. However, negative SPEP and abdominal fat pad biopsy negative. Low voltage on ECG may be due to obesity and not amyloidosis.  Possible that CHF exacerbation triggered by atrial flutter (had been in NSR when last seen in my office), also suspect dietary indiscretion and inadequate diuresis last admission play a role.  Echo this admission with EF 20-25%, moderate RV dysfunction.  He has diuresed well so far, weight down.  Still some volume overload on exam.  Creatinine stable.  - Continue IV lasix 120 mg BID for today.  He may be ready for po tomorrow.  - Continue  Coreg 25 mg BID, spironolactone 25 mg daily and Bidil 2 tabs TID.  - Agree with fluid limit 1500 cc daily.  2. Recurrent A Flutter Atypical atrial flutter: last episode was in 2015, he was cardioverted.  Now with recurrent atypical flutter in setting of CHF exacerbation (was in flutter 1 week ago also at last admission). - Seen by EP 03/07/16. Plan for now is TEE/DCCV 03/09/16 with amio load.  If recurrent, he may benefit from ablation.  3. Low voltage on ECG  SPEP and fat pad biopsy negative. See above. Consensus thus far is this is likely due to obesity rather than amyloidosis. 4. CKDIII  Creatinine stable at 2.1 today.  5. HTN Relatively stable on current regiment.   6. OSA Nightly CPAP.  7. Morbid Obesity 8. Gout:Stable.   Graciella Freer, PA-C 03/08/2016 11:40 AM  Advanced Heart Failure Team Pager [phone] (M-F; 7a - 4p)  Please contact [facility] for night-coverage after hours (4p -7a ) and weekends on [email or web address]  Patient seen with PA, agree with the above note.  He has diuresed well but remains volume overloaded (though improved).  Creatinine stable.  Will aim for one more day of IV diuresis, then transition to po (torsemide 60 mg po bid, avoid dietary sodium).   Plan for TEE-guided DCCV tomorrow.  Add amiodarone to try to maintain NSR given OSA and large atria.    Needs to try to use CPAP every night.   Possible discharge home tomorrow after DCCV if volume status looks optimized (versus discharge Friday).   [name] 03/08/2016 1:39 PM

## 2016-03-09 ENCOUNTER — Encounter (HOSPITAL_COMMUNITY): Payer: Self-pay | Admitting: *Deleted

## 2016-03-09 ENCOUNTER — Inpatient Hospital Stay (HOSPITAL_COMMUNITY): Payer: Medicaid Other | Admitting: Anesthesiology

## 2016-03-09 ENCOUNTER — Encounter (HOSPITAL_COMMUNITY)
Admission: EM | Disposition: A | Discharge status: Home / Self Care | Payer: Self-pay | Source: Home / Self Care | Attending: Student in an Organized Health Care Education/Training Program

## 2016-03-09 ENCOUNTER — Inpatient Hospital Stay (HOSPITAL_COMMUNITY)
Admit: 2016-03-09 | Discharge: 2016-03-09 | Disposition: A | Discharge status: Home / Self Care | Payer: Medicaid Other | Attending: Adult Health | Admitting: Adult Health

## 2016-03-09 DIAGNOSIS — I4892 Unspecified atrial flutter: Secondary | ICD-10-CM

## 2016-03-09 HISTORY — PX: TEE WITHOUT CARDIOVERSION: SHX5443

## 2016-03-09 HISTORY — PX: CARDIOVERSION: SHX1299

## 2016-03-09 LAB — BASIC METABOLIC PANEL
Anion gap: 10 (ref 5–15)
Anion gap: 11 (ref 5–15)
BUN: 37 mg/dL — ABNORMAL HIGH (ref 6–20)
BUN: 40 mg/dL — AB (ref 6–20)
CALCIUM: 9.3 mg/dL (ref 8.9–10.3)
CHLORIDE: 96 mmol/L — AB (ref 101–111)
CO2: 33 mmol/L — ABNORMAL HIGH (ref 22–32)
CO2: 30 mmol/L (ref 22–32)
CREATININE: 2.11 mg/dL — AB (ref 0.61–1.24)
CREATININE: 2.29 mg/dL — AB (ref 0.61–1.24)
Calcium: 9.3 mg/dL (ref 8.9–10.3)
Chloride: 98 mmol/L — ABNORMAL LOW (ref 101–111)
GFR calc Af Amer: 35 mL/min — ABNORMAL LOW (ref >60)
GFR, EST AFRICAN AMERICAN: 39 mL/min — AB (ref >60)
GFR, EST NON AFRICAN AMERICAN: 34 mL/min — AB (ref >60)
GFR, EST NON AFRICAN AMERICAN: 30 mL/min — AB (ref >60)
GLUCOSE: 81 mg/dL (ref 65–99)
Glucose, Bld: 97 mg/dL (ref 65–99)
Potassium: 3.7 mmol/L (ref 3.5–5.1)
Potassium: 3.8 mmol/L (ref 3.5–5.1)
SODIUM: 139 mmol/L (ref 135–145)
SODIUM: 139 mmol/L (ref 135–145)

## 2016-03-09 LAB — CBC
HCT: 40.2 % (ref 39.0–52.0)
Hemoglobin: 12.4 g/dL — ABNORMAL LOW (ref 13.0–17.0)
MCH: 28.2 pg (ref 26.0–34.0)
MCHC: 30.8 g/dL (ref 30.0–36.0)
MCV: 91.6 fL (ref 78.0–100.0)
PLATELETS: 302 10*3/uL (ref 150–400)
RBC: 4.39 MIL/uL (ref 4.22–5.81)
RDW: 13.9 % (ref 11.5–15.5)
WBC: 13.8 10*3/uL — AB (ref 4.0–10.5)

## 2016-03-09 SURGERY — CARDIOVERSION
Anesthesia: Monitor Anesthesia Care

## 2016-03-09 MED ORDER — BUTAMBEN-TETRACAINE-BENZOCAINE 2-2-14 % EX AERO
INHALATION_SPRAY | CUTANEOUS | Status: DC | PRN
  Administered 2016-03-09: 2 via TOPICAL

## 2016-03-09 MED ORDER — PROPOFOL 500 MG/50ML IV EMUL
INTRAVENOUS | Status: DC | PRN
  Administered 2016-03-09: 100 ug/kg/min via INTRAVENOUS

## 2016-03-09 MED ORDER — PHENYLEPHRINE HCL 10 MG/ML IJ SOLN
INTRAMUSCULAR | Status: DC | PRN
Start: 2016-03-09 — End: 2016-03-09
  Administered 2016-03-09 (×2): 200 ug via INTRAVENOUS

## 2016-03-09 MED ORDER — PROPOFOL 10 MG/ML IV BOLUS
INTRAVENOUS | Status: DC | PRN
  Administered 2016-03-09: 20 mg via INTRAVENOUS

## 2016-03-09 NOTE — Anesthesia Postprocedure Evaluation (Signed)
Anesthesia Post Note  Patient: Eric Murillo  Procedure(s) Performed: Procedure(s) (LRB): CARDIOVERSION (N/A) TRANSESOPHAGEAL ECHOCARDIOGRAM (TEE) (N/A)  Patient location during evaluation: PACU Anesthesia Type: General Level of consciousness: awake, awake and alert and oriented Pain management: pain level controlled Vital Signs Assessment: post-procedure vital signs reviewed and stable Respiratory status: spontaneous breathing, nonlabored ventilation and respiratory function stable Cardiovascular status: blood pressure returned to baseline Postop Assessment: no headache Anesthetic complications: no    Last Vitals:  Vitals:   03/09/16 1153 03/09/16 1158  BP: 105/60   Pulse: 78   Resp: 18   Temp:  36.6 C    Last Pain:  Vitals:   03/09/16 1512  TempSrc:   PainSc: 0-No pain                 Sheleen Conchas COKER

## 2016-03-09 NOTE — Transfer of Care (Signed)
Immediate Anesthesia Transfer of Care Note  Patient: Eric Murillo  Procedure(s) Performed: Procedure(s): CARDIOVERSION (N/A) TRANSESOPHAGEAL ECHOCARDIOGRAM (TEE) (N/A)  Patient Location: Endoscopy Unit  Anesthesia Type:MAC  Level of Consciousness: awake, alert  and oriented  Airway & Oxygen Therapy: Patient Spontanous Breathing and Patient connected to nasal cannula oxygen  Post-op Assessment: Report given to RN, Post -op Vital signs reviewed and stable and Patient moving all extremities  Post vital signs: Reviewed and stable  Last Vitals:  Vitals:   03/09/16 0820 03/09/16 0940  BP: 106/76 136/65  Pulse: 66 89  Resp: (!) 21 20  Temp: 36.9 C     Last Pain:  Vitals:   03/09/16 0820  TempSrc: Oral  PainSc:          Complications: No apparent anesthesia complications

## 2016-03-09 NOTE — Anesthesia Preprocedure Evaluation (Addendum)
Anesthesia Evaluation  Patient identified by MRN, date of birth, ID band Patient awake    Reviewed: Allergy & Precautions, NPO status , Patient's Chart, lab work & pertinent test results  Airway Mallampati: II  TM Distance: >3 FB Neck ROM: Full    Dental  (+) Teeth Intact, Dental Advisory Given   Pulmonary     + decreased breath sounds      Cardiovascular hypertension,  Rhythm:Regular Rate:Normal     Neuro/Psych    GI/Hepatic   Endo/Other    Renal/GU      Musculoskeletal   Abdominal (+) + obese,   Peds  Hematology   Anesthesia Other Findings   Reproductive/Obstetrics                            Anesthesia Physical Anesthesia Plan  ASA: III  Anesthesia Plan: General and MAC   Post-op Pain Management:    Induction: Intravenous  Airway Management Planned: Mask  Additional Equipment:   Intra-op Plan:   Post-operative Plan:   Informed Consent: I have reviewed the patients History and Physical, chart, labs and discussed the procedure including the risks, benefits and alternatives for the proposed anesthesia with the patient or authorized representative who has indicated his/her understanding and acceptance.     Plan Discussed with: CRNA and Anesthesiologist  Anesthesia Plan Comments:         Anesthesia Quick Evaluation

## 2016-03-09 NOTE — Procedures (Signed)
Electrical Cardioversion Procedure Note Eric Murillo [id number] October 02, 1960  Procedure: Electrical Cardioversion Indications:  Atrial Flutter  Procedure Details Consent: Risks of procedure as well as the alternatives and risks of each were explained to the (patient/caregiver).  Consent for procedure obtained. Time Out: Verified patient identification, verified procedure, site/side was marked, verified correct patient position, special equipment/implants available, medications/allergies/relevent history reviewed, required imaging and test results available.  Performed  Patient placed on cardiac monitor, pulse oximetry, supplemental oxygen as necessary.  Sedation given: Propofol per anesthesiology Pacer pads placed anterior and posterior chest.  Cardioverted 1 time(s).  Cardioverted at 150J.  Evaluation Findings: Post procedure EKG shows: NSR with 1st degree AV block.  He appears to have some Wenckebach episodes.  Complications: None Patient did tolerate procedure well.  Needs ECG  Eric Murillo 03/09/2016, 9:32 AM

## 2016-03-09 NOTE — Anesthesia Procedure Notes (Signed)
Procedure Name: MAC Date/Time: 03/09/2016 9:11 AM Performed by: Orvilla Fus A Pre-anesthesia Checklist: Patient identified, Emergency Drugs available, Suction available, Patient being monitored and Timeout performed Oxygen Delivery Method: Nasal cannula Placement Confirmation: positive ETCO2

## 2016-03-09 NOTE — Progress Notes (Signed)
Patient ID: Eric Murillo, male   DOB: [birth year], 55 y.o.   MRN: [id number]    SUBJECTIVE:   I/O negative again, breathing better.  Still in atypical flutter.  Scheduled Meds: . [MAR Hold] allopurinol  100 mg Oral BID  . [MAR Hold] amiodarone  200 mg Oral BID  . [MAR Hold] apixaban  5 mg Oral BID  . [MAR Hold] carvedilol  25 mg Oral BID WC  . [MAR Hold] furosemide  120 mg Intravenous BID  . [MAR Hold] isosorbide-hydrALAZINE  2 tablet Oral TID  . [MAR Hold] potassium chloride SA  40 mEq Oral BID  . [MAR Hold] sodium chloride flush  3 mL Intravenous Q12H  . [MAR Hold] sodium chloride flush  3 mL Intravenous Q12H  . [MAR Hold] sodium chloride flush  3 mL Intravenous Q12H  . [MAR Hold] sodium chloride flush  3 mL Intravenous Q12H  . [MAR Hold] spironolactone  25 mg Oral Daily   Continuous Infusions: . sodium chloride 20 mL/hr at 03/09/16 0836  . sodium chloride    . sodium chloride 20 mL/hr at 03/08/16 2300  . sodium chloride    . sodium chloride     PRN Meds:.[MAR Hold] acetaminophen **OR** [MAR Hold] acetaminophen, [MAR Hold] oxyCODONE-acetaminophen **AND** [MAR Hold] oxyCODONE, [MAR Hold] sodium chloride flush, [MAR Hold] sodium chloride flush, [MAR Hold] sodium chloride flush    Vitals:   03/08/16 1243 03/08/16 1955 03/09/16 0528 03/09/16 0820  BP: (!) 143/69 121/74 122/80 106/76  Pulse: 84 78 76 66  Resp: 20 20 18  (!) 21  Temp: 99 F (37.2 C) 97.9 F (36.6 C) 98.2 F (36.8 C) 98.4 F (36.9 C)  TempSrc: Oral  Oral Oral  SpO2: 93% 93% 95% 99%  Weight:   (!) 324 lb 3.2 oz (147.1 kg) (!) 324 lb (147 kg)  Height:    5\' 9"  (1.753 m)    Intake/Output Summary (Last 24 hours) at 03/09/16 0911 Last data filed at 03/08/16 2316  Gross per 24 hour  Intake              840 ml  Output             2805 ml  Net            -1965 ml    LABS: Basic Metabolic Panel:  Recent Labs  40/98/1108/14/17 0950  03/07/16 0403  03/08/16 0812 03/09/16 0309  NA  --   < > 139  < > 140 139  K   --   < > 3.7  < > 3.8 3.7  CL  --   < > 98*  < > 96* 96*  CO2  --   < > 31  < > 33* 33*  GLUCOSE  --   < > 108*  < > 135* 97  BUN  --   < > 33*  < > 37* 37*  CREATININE  --   < > 2.14*  < > 2.17* 2.11*  CALCIUM  --   < > 9.0  < > 9.5 9.3  MG 2.5*  --  2.4  --   --   --   < > = values in this interval not displayed. Liver Function Tests: No results for input(s): AST, ALT, ALKPHOS, BILITOT, PROT, ALBUMIN in the last 72 hours. No results for input(s): LIPASE, AMYLASE in the last 72 hours. CBC:  Recent Labs  03/08/16 0421 03/09/16 0309  WBC 14.0* 13.8*  HGB 12.6* 12.4*  HCT 40.4 40.2  MCV 91.6 91.6  PLT 260 302   Cardiac Enzymes: No results for input(s): CKTOTAL, CKMB, CKMBINDEX, TROPONINI in the last 72 hours. BNP: Invalid input(s): POCBNP D-Dimer: No results for input(s): DDIMER in the last 72 hours. Hemoglobin A1C: No results for input(s): HGBA1C in the last 72 hours. Fasting Lipid Panel: No results for input(s): CHOL, HDL, LDLCALC, TRIG, CHOLHDL, LDLDIRECT in the last 72 hours. Thyroid Function Tests: No results for input(s): TSH, T4TOTAL, T3FREE, THYROIDAB in the last 72 hours.  Invalid input(s): FREET3 Anemia Panel: No results for input(s): VITAMINB12, FOLATE, FERRITIN, TIBC, IRON, RETICCTPCT in the last 72 hours.  RADIOLOGY: Dg Chest 2 View  Result Date: 03/05/2016 CLINICAL DATA:  Acute onset of generalized chest tightness, shortness of breath and dry cough. Initial encounter. EXAM: CHEST  2 VIEW COMPARISON:  Chest radiograph performed 02/28/2016 FINDINGS: Vascular congestion is noted. Bibasilar airspace opacities may reflect mild interstitial edema, though pneumonia might have a similar appearance. No pleural effusion or pneumothorax is seen. The heart is borderline enlarged. An AICD is noted at the left chest wall, with a single lead ending at the right ventricle. No acute osseous abnormalities are identified. IMPRESSION: Vascular congestion and borderline  cardiomegaly. Bibasilar airspace opacities may reflect mild interstitial edema, though pneumonia might have a similar appearance depending on the patient's symptoms. Electronically Signed   By: Roanna Raider M.D.   On: 03/05/2016 19:15   Dg Chest 2 View  Result Date: 02/28/2016 CLINICAL DATA:  Shortness of breath for 5 days. EXAM: CHEST  2 VIEW COMPARISON:  07/24/2015 FINDINGS: Asymmetric elevation right hemidiaphragm is stable. The cardio pericardial silhouette is enlarged. There is pulmonary vascular congestion without overt pulmonary edema. Left pacer/AICD remains in place. The visualized bony structures of the thorax are intact. IMPRESSION: stable. Cardiomegaly with vascular congestion. No acute cardiopulmonary findings. Electronically Signed   By: Kennith Center M.D.   On: 02/28/2016 13:18    PHYSICAL EXAM General: NAD Neck: JVP 10 cm, no thyromegaly or thyroid nodule.  Lungs: CTAB, normal effort.  CV: Nondisplaced PMI.  Heart irregular S1/S2, no S3/S4, no murmur.  1+ edema to knees bilaterally.   Abdomen: Soft, nontender, no hepatosplenomegaly, no distention.  Neurologic: Alert and oriented x 3.  Psych: Normal affect. Extremities: No clubbing or cyanosis.   TELEMETRY: Reviewed personally, he remains in AFL/atypical.  ASSESSMENT AND PLAN:  Eric Murillo is a 55 y.o. male well known by the HF clinic who presents for the 2nd time in 2 weeks with A/C systolic HF.  Both times EKG have shown AFL. Cardiology not consulted first admission.   1. Acute on chronic systolic heart failure: Nonischemic cardiomyopathy, likely related to HTN. St Jude ICD.  Echo in 8/14 with EF 45% but EF down to 25-30% on TEE while in atrial flutter (03/2014). Echo (1/16) with EF ~25% and echo in 6/16 with EF 30-35%. No cardiac MRI done with elevated creatinine and size. There was concern for cardiac amyloidosis. However, negative SPEP and abdominal fat pad biopsy negative. Low voltage on ECG may be due to obesity  and not amyloidosis.  Possible that CHF exacerbation triggered by atrial flutter (had been in NSR when last seen in my office), also suspect dietary indiscretion and inadequate diuresis last admission play a role.  Echo this admission with EF 20-25%, moderate RV dysfunction.  He has diuresed well so far, weight down.  Still some volume overload on exam.  Creatinine stable.  - Continue IV lasix 120  mg BID for today.  Probably to po tomorrow.  - Continue Coreg 25 mg BID, spironolactone 25 mg daily and Bidil 2 tabs TID.  - Agree with fluid limit 1500 cc daily.  2. Recurrent A Flutter Atypical atrial flutter: last episode was in 2015, he was cardioverted.  Now with recurrent atypical flutter in setting of CHF exacerbation (was in flutter 1 week ago also at last admission). - Seen by EP 03/07/16. Plan for now is TEE/DCCV today with amiodarone load. If recurrent, he may benefit from ablation. Procedure risks/benefits discussed with patient and family.  3. Low voltage on ECG  SPEP and fat pad biopsy negative. See above. Consensus thus far is this is likely due to obesity rather than amyloidosis. 4. CKDIII  Creatinine stable at 2.1 today.  5. HTN Relatively stable on current regiment.   6. OSA Nightly CPAP.  7. Morbid Obesity 8. Gout:Stable.   [name],  03/09/2016 9:11 AM  Advanced Heart Failure Team Pager [phone] (M-F; 7a - 4p)  Please contact [facility] for night-coverage after hours (4p -7a ) and weekends on [email or web address]

## 2016-03-09 NOTE — Progress Notes (Signed)
Patient stated he has been coughing a lot and does not think he can tolerated CPAP tonight. RT informed patient to call if he changes his mind.

## 2016-03-09 NOTE — Progress Notes (Signed)
Echocardiogram Echocardiogram Transesophageal has been performed.  Eric Murillo 03/09/2016, 9:54 AM

## 2016-03-09 NOTE — CV Procedure (Signed)
Procedure: TEE  Indication: Atrial flutter  Sedation: Per anesthesiology  Findings: Please see echo section for full report.  TEE was limited primarily to evaluation of LA appendage as airway was difficult to maintain and patient was hypoxemic.  Mildly dilated LV with diffuse hypokinesis, EF 25-30%.  Mildly dilated RV with mildly decreased RV systolic function.  No LA appendage thrombus noted.   Patient was awake/alert with no problems after DCCV.   Eric Murillo 03/09/2016 9:32 AM

## 2016-03-09 NOTE — Progress Notes (Signed)
Subjective: Patient has been seen after his cardioversion; he states he feels good, no chest pain, no shortness of breath, no palpitations. He states he is still making a lot of urine.   Objective:  Vital signs in last 24 hours: Vitals:   03/09/16 0940 03/09/16 0950 03/09/16 1153 03/09/16 1158  BP: 136/65 124/82 105/60   Pulse: 89 86 78   Resp: 20 14 18    Temp:    97.9 F (36.6 C)  TempSrc:    Oral  SpO2: 96% 95%  98%  Weight:      Height:       Constitutional: sitting in chair, appears comfortable CV: RRR without murmurs, rubs or gallops Resp: no crackles, no wheezing, no increased work of breathing Abd: Obese, soft, non tender Ext: Cool, weak pulses throughout, 1+ pitting edema  Assessment/Plan:  Principal Problem:   Acute on chronic systolic heart failure (HCC) Active Problems:   Hyperlipidemia   Morbid obesity with BMI of 45.0-49.9, adult (HCC)   CKD (chronic kidney disease) stage 3, GFR 30-59 ml/min   Cardiomyopathy, nonischemic (HCC)   OSA (obstructive sleep apnea)   Atrial flutter (HCC)   Anemia of chronic disease   Gout   AKI (acute kidney injury) (HCC)  Acute on Chronic CHF with reduced EF: Patient with history of CHF with non-ischemic cardiomyopathy, EF 30-35%, with ICD in placepresented with increasing shortness of breath for two days with reported medication compliance, but possible nonadherence to fluid restriction. Patient's estimated dry weight is around 325-330lbs, discharge weight and current weight are 334lb. At home is is on Bidil 20-37.5mg  2 tabs TID, spironolactone 25mg  daily, torsemide 60mg  AM and 40mg  PM, and coreg 25mg  bid. His last ECHO was in June 2016 showing EF 30-35%, last RHC/LHC were in 10/15 with no significant coronary disease. EKG unchanged, CXR shows borderline cardiomegaly (unchanged) with vascular congestion. Due to multiple recent hospitalizations for CHF exacerbations we will pursue further cardiac workup. His ECHO showed a decrease in  EF to 20-25% (from 30-35% one year prior), and a significantly dilated L atrium. TEE-guided DCCV with an amiodarone load was successful in converting pt to sinus rhythm. TEE showed EF 25-30%, with mildly dilated LV with diffuse hypokinesis, mildly dilated RV and mildly decreased RV systolic fxn; a LA appendage thrombus was not appreciated. Dropped 2kg in weight since yesterday; will continue IV lasix to optimize diuresis. --IV Lasix 120mg  BID; will transition to PO torsemide 60mg  bid tomorrow --Strict I&O's with daily weights --telemetry  --continue home medication of  Bidil 20-37.5mg  2 tabs TID, spironolactone 25mg  daily, and coreg 25mg  bid; holding lisinopril  Atrial Flutter: Patient in atrial flutter, on Eliquis with some missed doses recently. Currently not a candidate for ablation due to extent of LA dilation. TEE-guided DCCV with amiodarone load successful in converting to sinus rhythm so far. Currently, patient NSR with 1st degree block. No LA appendage thrombus was appreciated. --Continue Eliquis 5mg  bid, coreg 25mg  --Tele  Chronic Kidney Disease, stage III: Creatinine at 2.16 on admission (baseline ~1.8, Cr on recent discharge 1.97), GFR 38. Slight elevation in Cr likely due to fluid overload, but will monitor. Currently 2.11, stable. --hold lisinopril --monitor  Gout: Patient with a history of gout on chronic allopurinol therapy. --Continue home allopurinol 100mg  daily  OSA: Patient with history of OSA, supposed to be on CPAP at night. He has a CPAP machine but irregularly uses it. --Provide CPAP while inpatient with education of proper use. --Plan for outpatient titration of  CPAP  Dispo: Anticipated discharge in approximately 1 day.   Nyra MarketGorica Lucile Didonato, MD 03/09/2016, 1:24 PM Pager: [phone]

## 2016-03-10 ENCOUNTER — Encounter (HOSPITAL_COMMUNITY): Payer: Self-pay | Admitting: Cardiology

## 2016-03-10 ENCOUNTER — Other Ambulatory Visit: Payer: Self-pay

## 2016-03-10 LAB — CBC
HCT: 41.2 % (ref 39.0–52.0)
HEMOGLOBIN: 12.6 g/dL — AB (ref 13.0–17.0)
MCH: 28.3 pg (ref 26.0–34.0)
MCHC: 30.6 g/dL (ref 30.0–36.0)
MCV: 92.4 fL (ref 78.0–100.0)
Platelets: 300 10*3/uL (ref 150–400)
RBC: 4.46 MIL/uL (ref 4.22–5.81)
RDW: 13.8 % (ref 11.5–15.5)
WBC: 13.7 10*3/uL — ABNORMAL HIGH (ref 4.0–10.5)

## 2016-03-10 LAB — BASIC METABOLIC PANEL
ANION GAP: 11 (ref 5–15)
BUN: 40 mg/dL — ABNORMAL HIGH (ref 6–20)
CHLORIDE: 96 mmol/L — AB (ref 101–111)
CO2: 31 mmol/L (ref 22–32)
Calcium: 9.4 mg/dL (ref 8.9–10.3)
Creatinine, Ser: 2.24 mg/dL — ABNORMAL HIGH (ref 0.61–1.24)
GFR calc non Af Amer: 31 mL/min — ABNORMAL LOW (ref >60)
GFR, EST AFRICAN AMERICAN: 36 mL/min — AB (ref >60)
Glucose, Bld: 132 mg/dL — ABNORMAL HIGH (ref 65–99)
Potassium: 4.4 mmol/L (ref 3.5–5.1)
Sodium: 138 mmol/L (ref 135–145)

## 2016-03-10 MED ORDER — TORSEMIDE 20 MG PO TABS: 60.0000 mg | ORAL_TABLET | Freq: Two times a day (BID) | ORAL | 2 refills | Status: DC

## 2016-03-10 MED ORDER — TORSEMIDE 20 MG PO TABS: 60.0000 mg | ORAL_TABLET | Freq: Two times a day (BID) | ORAL | Status: DC

## 2016-03-10 MED ORDER — POTASSIUM CHLORIDE CRYS ER 20 MEQ PO TBCR
20.0000 meq | EXTENDED_RELEASE_TABLET | Freq: Two times a day (BID) | ORAL | Status: DC
  Administered 2016-03-10: 20 meq via ORAL
  Filled 2016-03-10: qty 1

## 2016-03-10 MED ORDER — AMIODARONE HCL 200 MG PO TABS: ORAL_TABLET | ORAL | 0 refills | Status: DC

## 2016-03-10 NOTE — Telephone Encounter (Signed)
Provided with DEA

## 2016-03-10 NOTE — Telephone Encounter (Signed)
[place] from [facility] requesting DEA number for Dr Samuella Cota.

## 2016-03-10 NOTE — Progress Notes (Signed)
Pt has orders for discharge. IV and telemetry removed; IV site WNL. Discharge instructions given, pt verbalized understanding. Medication regimen reviewed, pt verbalized understanding. No further questions or concerns at this time. Pt awaiting transportation. 

## 2016-03-10 NOTE — Progress Notes (Signed)
Patient ID: [name], male   DOB: 07/08/1961, 55 y.o.   MRN: [id number]    SUBJECTIVE:   I/O negative again, breathing better overall and walking in halls.  TEE-DCCV on 8/17, he is now in NSR with 1st degree AV block.  Scheduled Meds: . allopurinol  100 mg Oral BID  . amiodarone  200 mg Oral BID  . apixaban  5 mg Oral BID  . carvedilol  25 mg Oral BID WC  . isosorbide-hydrALAZINE  2 tablet Oral TID  . potassium chloride SA  20 mEq Oral BID  . sodium chloride flush  3 mL Intravenous Q12H  . sodium chloride flush  3 mL Intravenous Q12H  . sodium chloride flush  3 mL Intravenous Q12H  . sodium chloride flush  3 mL Intravenous Q12H  . spironolactone  25 mg Oral Daily  . torsemide  60 mg Oral BID   Continuous Infusions: . sodium chloride    . sodium chloride    . sodium chloride     PRN Meds:.acetaminophen **OR** acetaminophen, oxyCODONE-acetaminophen **AND** oxyCODONE, sodium chloride flush, sodium chloride flush, sodium chloride flush    Vitals:   03/09/16 1153 03/09/16 1158 03/09/16 2100 03/10/16 0525  BP: 105/60  115/75 126/69  Pulse: 78  93 81  Resp: 18  18 20   Temp:  97.9 F (36.6 C) 97.7 F (36.5 C) 98.5 F (36.9 C)  TempSrc:  Oral Oral Oral  SpO2:  98% 96% 95%  Weight:    (!) 324 lb 1.6 oz (147 kg)  Height:        Intake/Output Summary (Last 24 hours) at 03/10/16 0904 Last data filed at 03/10/16 0700  Gross per 24 hour  Intake             1340 ml  Output             3325 ml  Net            -1985 ml    LABS: Basic Metabolic Panel:  Recent Labs  56/21/3008/17/17 1512 03/10/16 0314  NA 139 138  K 3.8 4.4  CL 98* 96*  CO2 30 31  GLUCOSE 81 132*  BUN 40* 40*  CREATININE 2.29* 2.24*  CALCIUM 9.3 9.4   Liver Function Tests: No results for input(s): AST, ALT, ALKPHOS, BILITOT, PROT, ALBUMIN in the last 72 hours. No results for input(s): LIPASE, AMYLASE in the last 72 hours. CBC:  Recent Labs  03/09/16 0309 03/10/16 0314  WBC 13.8* 13.7*  HGB  12.4* 12.6*  HCT 40.2 41.2  MCV 91.6 92.4  PLT 302 300   Cardiac Enzymes: No results for input(s): CKTOTAL, CKMB, CKMBINDEX, TROPONINI in the last 72 hours. BNP: Invalid input(s): POCBNP D-Dimer: No results for input(s): DDIMER in the last 72 hours. Hemoglobin A1C: No results for input(s): HGBA1C in the last 72 hours. Fasting Lipid Panel: No results for input(s): CHOL, HDL, LDLCALC, TRIG, CHOLHDL, LDLDIRECT in the last 72 hours. Thyroid Function Tests: No results for input(s): TSH, T4TOTAL, T3FREE, THYROIDAB in the last 72 hours.  Invalid input(s): FREET3 Anemia Panel: No results for input(s): VITAMINB12, FOLATE, FERRITIN, TIBC, IRON, RETICCTPCT in the last 72 hours.  RADIOLOGY: Dg Chest 2 View  Result Date: 03/05/2016 CLINICAL DATA:  Acute onset of generalized chest tightness, shortness of breath and dry cough. Initial encounter. EXAM: CHEST  2 VIEW COMPARISON:  Chest radiograph performed 02/28/2016 FINDINGS: Vascular congestion is noted. Bibasilar airspace opacities may reflect mild interstitial edema, though pneumonia might  have a similar appearance. No pleural effusion or pneumothorax is seen. The heart is borderline enlarged. An AICD is noted at the left chest wall, with a single lead ending at the right ventricle. No acute osseous abnormalities are identified. IMPRESSION: Vascular congestion and borderline cardiomegaly. Bibasilar airspace opacities may reflect mild interstitial edema, though pneumonia might have a similar appearance depending on the patient's symptoms. Electronically Signed   By: Roanna Raider M.D.   On: 03/05/2016 19:15   Dg Chest 2 View  Result Date: 02/28/2016 CLINICAL DATA:  Shortness of breath for 5 days. EXAM: CHEST  2 VIEW COMPARISON:  07/24/2015 FINDINGS: Asymmetric elevation right hemidiaphragm is stable. The cardio pericardial silhouette is enlarged. There is pulmonary vascular congestion without overt pulmonary edema. Left pacer/AICD remains in place.  The visualized bony structures of the thorax are intact. IMPRESSION: stable. Cardiomegaly with vascular congestion. No acute cardiopulmonary findings. Electronically Signed   By: Kennith Center M.D.   On: 02/28/2016 13:18    PHYSICAL EXAM General: NAD Neck: JVP 8 cm, no thyromegaly or thyroid nodule.  Lungs: CTAB, normal effort.  CV: Nondisplaced PMI.  Heart regular S1/S2, no S3/S4, no murmur.  1+ edema at ankles.   Abdomen: Soft, nontender, no hepatosplenomegaly, no distention.  Neurologic: Alert and oriented x 3.  Psych: Normal affect. Extremities: No clubbing or cyanosis.   TELEMETRY: Reviewed personally,  NSR with 1st degree AVB  ASSESSMENT AND PLAN:  Eric Murillo is a 55 y.o. male well known by the HF clinic who presents for the 2nd time in 2 weeks with A/C systolic HF.  Both times EKG have shown AFL. Cardiology not consulted first admission.   1. Acute on chronic systolic heart failure: Nonischemic cardiomyopathy, likely related to HTN. St Jude ICD.  Echo in 8/14 with EF 45% but EF down to 25-30% on TEE while in atrial flutter (03/2014). Echo (1/16) with EF ~25% and echo in 6/16 with EF 30-35%. No cardiac MRI done with elevated creatinine and size. There was concern for cardiac amyloidosis. However, negative SPEP and abdominal fat pad biopsy negative. Low voltage on ECG may be due to obesity and not amyloidosis.  Possible that CHF exacerbation triggered by atrial flutter (had been in NSR when last seen in my office), also suspect dietary indiscretion and inadequate diuresis last admission play a role.  Echo this admission with EF 20-25%, moderate RV dysfunction.  He has diuresed well so far, weight down.  Volume looks better.  Creatinine elevated but stable.  - Stop IV Lasix, will start torsemide 60 mg bid.  - Continue Coreg 25 mg BID, spironolactone 25 mg daily and Bidil 2 tabs TID.  - Agree with fluid limit 1500 cc daily.  2. Recurrent A Flutter Atypical atrial flutter: last  episode was in 2015, he was cardioverted.  Now with recurrent atypical flutter in setting of CHF exacerbation (was in flutter 1 week ago also at last admission).  Seen by EP 03/07/16. He had TEE-guided DCCV on 8/17, now on NSR with 1st degree AV block.  He is on amiodarone to try to maintain NSR. If recurrent flutter, he may benefit from ablation.  3. Low voltage on ECG  SPEP and fat pad biopsy negative. See above. Consensus thus far is this is likely due to obesity rather than amyloidosis. 4. CKDIII  Creatinine stable at 2.2 today.  5. HTN Relatively stable on current regiment.   6. OSA Nightly CPAP.  7. Morbid Obesity 8. Gout:Stable.  9. Disposition: Has  brother's funeral tomorrow.  I think it would be ok if he goes home today.  He has an appointment with me in about 11 days.  He will need a BMET in a week or so.  Cardiac meds for home: torsemide 60 bid, KCl 20 bid, amiodarone 200 bid x 1 week then 200 daily, apixaban 5 bid, Coreg 25 bid, spironolactone 25 daily, Bidil 2 tabs bid.   [name],  03/10/2016 9:04 AM  Advanced Heart Failure Team Pager [phone] (M-F; 7a - 4p)  Please contact [facility] for night-coverage after hours (4p -7a ) and weekends on [email or web address]

## 2016-03-10 NOTE — Discharge Summary (Signed)
Name: Eric Murillo MRN: [id number] DOB: [birth year] 55 y.o. PCP: Darreld Mclean, MD  Date of Admission: 03/05/2016  7:30 PM Date of Discharge: 03/10/2016 Attending Physician: Earl Lagos, MD  Discharge Diagnosis: Acute on chronic systolic heart failure Principal Problem:   Acute on chronic systolic heart failure (HCC) Active Problems:   Hyperlipidemia   Morbid obesity with BMI of 45.0-49.9, adult (HCC)   CKD (chronic kidney disease) stage 3, GFR 30-59 ml/min   Cardiomyopathy, nonischemic (HCC)   OSA (obstructive sleep apnea)   Atrial flutter (HCC)   Anemia of chronic disease   Gout   AKI (acute kidney injury) (HCC)   Discharge Medications:   Medication List    STOP taking these medications   lisinopril 2.5 MG tablet Commonly known as:  PRINIVIL,ZESTRIL   metolazone 2.5 MG tablet Commonly known as:  ZAROXOLYN     TAKE these medications   allopurinol 100 MG tablet Commonly known as:  ZYLOPRIM TAKE 1 TABLET BY MOUTH TWO TIMES DAILY   amiodarone 200 MG tablet Commonly known as:  PACERONE Take one tablet (200mg ) two times a day for 7 days. After that, take just one tablet once a day.   BIDIL 20-37.5 MG tablet Generic drug:  isosorbide-hydrALAZINE TAKE 2 TABLETS BY MOUTH 3 TIMES A DAY   carvedilol 25 MG tablet Commonly known as:  COREG TAKE 1 TABLET BY MOUTH TWICE A DAY WITH A MEAL   colchicine 0.6 MG tablet Take 0.6 mg by mouth 2 (two) times daily as needed (gouty flare). Reported on 01/18/2016   ELIQUIS 5 MG Tabs tablet Generic drug:  apixaban TAKE 1 TABLET BY MOUTH TWICE A DAY   KLOR-CON M20 20 MEQ tablet Generic drug:  potassium chloride SA TAKE 1 TABLET BY MOUTH TWICE A DAY   oxyCODONE-acetaminophen 10-325 MG tablet Commonly known as:  PERCOCET Take 1 tablet by mouth every 6 (six) hours as needed for pain.   spironolactone 25 MG tablet Commonly known as:  ALDACTONE TAKE 1 TABLET (25 MG TOTAL) BY MOUTH DAILY.   torsemide 20 MG tablet Commonly  known as:  DEMADEX Take 3 tablets (60 mg total) by mouth 2 (two) times daily. What changed:  See the new instructions.       Disposition and follow-up:   [name] was discharged from [facility] in Good condition.  At the hospital follow up visit please address:  1.   -- please follow up on correct usage of meds -- f/u with patient about checking his weight every morning -- f/u with patient about adherence with fluid restriction (<1.5L daily) and sodium restriction (1500mg  daily) -- f/u on shortness of breath and chest pain -- f/u on change in diet for weight loss -- f/u on usage of CPAP at night, may need outpatient CPAP titration -- re-evaluate need for lisinopril   2.  Labs / imaging needed at time of follow-up: BMP  3.  Pending labs/ test needing follow-up: Full TEE reading 03/09/2016  Follow-up Appointments: Follow-up Information    [name], MD Follow up on 03/21/2016.   Specialty:  Cardiology Why:  at 1100 am for post hospital follow up. Please bring all of your medications to your visit. The code for parking is 0003. Contact information: [address] [place] [address] [phone]        [facility] Follow up on 03/17/2016.   Specialty:  Cardiology Why:  for labs. Come anytime  between 830 and 1230. Contact information: [address] [id number] [place] [id number] [phone]       Wilhelmina McardleLauren Kennedy, RN .   Specialty:  Student Why:  Follow up on 03/21/2016 at 10:00 am for an education session.  Check in at the [facility] and I will meet you there.  The code for parking is 0003.       [facility] Follow up on 03/22/2016.   Why:  Please go to your appointment for a hospital follow up on Aug 30th at 9:45 Contact information: [address] [place] [id number] [phone]            Hospital Course by problem list: Principal Problem:   Acute on chronic systolic heart failure (HCC) Active Problems:   Hyperlipidemia   Morbid obesity with BMI of 45.0-49.9, adult (HCC)   CKD (chronic kidney disease) stage 3, GFR 30-59 ml/min   Cardiomyopathy, nonischemic (HCC)   OSA (obstructive sleep apnea)   Atrial flutter (HCC)   Anemia of chronic disease   Gout   AKI (acute kidney injury) (HCC)   Acute on Chronic CHF with reduced EF: Patient with history of CHF with non-ischemic cardiomyopathy, EF 30-35% in June 2016, with ICD in place presented with increasing shortness of breath for two days with reported medication compliance, but possible nonadherence to fluid restriction. He had recently been discharge a week prior for an exacerbation treated with IV lasix. Patient's estimated dry weight is around 325-330lbs, last discharge weight and weight on admission are 334lb. At home he is on Bidil 20-37.5mg  2 tabs TID, spironolactone 25mg  daily, torsemide 60mg  AM and 40mg  PM, and coreg 25mg  bid. ECHO in June 2016 showing EF 30-35%, last RHC/LHC were in 10/15 with no significant coronary disease. On admission EKG unchanged, CXR shows borderline cardiomegaly (unchanged) with vascular congestion. Due to multiple recent hospitalizations for CHF exacerbations further cardiac workup was pursued. He has been in A flutter for the past two admission. His ECHO showed a decrease in EF to 20-25% (from 30-35% one year prior), and a significantly dilated L atrium. TEE-guided DCCV with an amiodarone load was successful in converting pt to sinus rhythm. TEE showed EF 25-30%, with mildly dilated LV with diffuse hypokinesis, mildly dilated RV and mildly decreased RV systolic fxn; a LA appendage thrombus was not appreciated. In addition to converting patient to sinus rhythm, he was treated with IV lasix. Pt lost 10.5lbs since admission; d/c weight 324lbs. His medications on discharge are: bidil 2 tabs tid,  spironolactone 25mg  daily, coreg 25mg  daily, amiodaron 200mg  BID x1wk, then 200mg  daily. On fluid restriction <1.5L a day, and sodium restriction 1500mg  a day.  Atrial Flutter: Patient in atrial flutter on admission, on Eliquis with some missed doses recently. Currently not a candidate for ablation due to extent of LA dilation. TEE-guided DCCV with amiodarone load successful in converting to sinus rhythm so far. On discharge, patient in NSR with 1st degree block. No LA appendage thrombus was appreciated. On discharge, he was continued on Eliquis 5mg  bid and coreg 25mg  bid; amiodarone was added as discussed above.  Chronic Kidney Disease, stage III: Creatinine at 2.16 on admission (baseline ~1.8, Cr on recent discharge 1.97), GFR 38. Slight elevation in Cr likely due to fluid overload, but will monitor. On discharge, Cr was stable at 2.24. His lisinopril has not been continued on discharge due to lack or normalization of his creatine so far and  high doses of lasix he was receiving.   Gout: Patient with a history of gout on chronic allopurinol therapy which was continued.  OSA: Patient with history of OSA, supposed to be on CPAP at night. He has a CPAP machine but irregularly uses it. He has not had much use of the CPAP while in the hospital due to discomfort. Possibility of attempting outpatient titration of CPAP.  Discharge Vitals:   BP 126/69 (BP Location: Left Arm)   Pulse 81   Temp 98.5 F (36.9 C) (Oral)   Resp 20   Ht 5\' 9"  (1.753 m)   Wt (!) 147 kg (324 lb 1.6 oz)   SpO2 95%   BMI 47.86 kg/m   Pertinent Labs, Studies, and Procedures:  BMP Latest Ref Rng & Units 03/10/2016 03/09/2016 03/09/2016  Glucose 65 - 99 mg/dL 707(A) 81 97  BUN 6 - 20 mg/dL 15(H) 83(U) 37(D)  Creatinine 0.61 - 1.24 mg/dL 5.78(X) 7.84(R) 8.41(Q)  BUN/Creat Ratio 9 - 20 - - -  Sodium 135 - 145 mmol/L 138 139 139  Potassium 3.5 - 5.1 mmol/L 4.4 3.8 3.7  Chloride 101 - 111 mmol/L 96(L) 98(L) 96(L)  CO2 22 - 32  mmol/L 31 30 33(H)  Calcium 8.9 - 10.3 mg/dL 9.4 9.3 9.3   CBC Latest Ref Rng & Units 03/10/2016 03/09/2016 03/08/2016  WBC 4.0 - 10.5 K/uL 13.7(H) 13.8(H) 14.0(H)  Hemoglobin 13.0 - 17.0 g/dL 12.6(L) 12.4(L) 12.6(L)  Hematocrit 39.0 - 52.0 % 41.2 40.2 40.4  Platelets 150 - 400 K/uL 300 302 260   CXR 03/05/2016: Vascular congestion and borderline cardiomegaly. Bibasilar airspace opacities may reflect mild interstitial edema, though pneumonia might have a similar appearance depending on the patient's symptoms.  ECHO 03/07/2016: - Left ventricle: The cavity size was mildly dilated. Systolic   function was severely reduced. The estimated ejection fraction   was in the range of 20% to 25%. Diffuse hypokinesis. - Mitral valve: Calcified annulus. There was mild regurgitation. - Left atrium: The atrium was moderately dilated. - Right ventricle: The cavity size was mildly dilated. Wall   thickness was normal. Pacer wire or catheter noted in right   ventricle. Systolic function was moderately reduced. - Right atrium: The atrium was mildly dilated. Pacer wire or   catheter noted in right atrium. - Pulmonary arteries: Systolic pressure was moderately increased.   PA peak pressure: 48 mm Hg (S).   Discharge Instructions: Discharge Instructions    (HEART FAILURE PATIENTS) Call MD:  Anytime you have any of the following symptoms: 1) 3 pound weight gain in 24 hours or 5 pounds in 1 week 2) shortness of breath, with or without a dry hacking cough 3) swelling in the hands, feet or stomach 4) if you have to sleep on extra pillows at night in order to breathe.    Complete by:  As directed   Diet - low sodium heart healthy    Complete by:  As directed   Discharge instructions    Complete by:  As directed   We have made some changes to your medications. These are the medications you will now be taking for your heart: --Eliquis 5mg  twice a day --Coreg 25mg  twice a day --Spironolactone 25mg  daily --Bidil 2  tabs three times a day --Torsemide 60mg  (3 tablets) twice a day** --Amiodarone: for first 7 days take 200mg  twice a day, after that take only 200mg  once a day** --Potassium chloride twice a day  Please check your weight  every morning right when you get up. Please keep your fluid intake less than 50 ounces a day. Please follow the instruction the dietician gave you about losing weight and decreasing sodium intake to less than 1500mg  daily.  If you have any increasing shortness of breath, chest pain or weight gain of 3 or more pounds in one day, please call the heart failure clinic.   Increase activity slowly    Complete by:  As directed      Signed: Nyra Market, MD 03/10/2016, 1:52 PM   Pager: [phone]

## 2016-03-10 NOTE — Progress Notes (Signed)
Subjective: Patient continues to diurese (close to 2L over last day) without change in weight. He denies shortness of breath, chest pain, and feels well overall.   Objective:  Vital signs in last 24 hours: Vitals:   03/09/16 1153 03/09/16 1158 03/09/16 2100 03/10/16 0525  BP: 105/60  115/75 126/69  Pulse: 78  93 81  Resp: 18  18 20   Temp:  97.9 F (36.6 C) 97.7 F (36.5 C) 98.5 F (36.9 C)  TempSrc:  Oral Oral Oral  SpO2:  98% 96% 95%  Weight:    (!) 147 kg (324 lb 1.6 oz)  Height:       Constitutional: sitting in chair, appears comfortable CV: RRR without murmurs, rubs or gallops Resp: no crackles, no wheezing, no increased work of breathing Abd: Obese, soft, non tender Ext: Cool, 1+ pulses throughout, 1+ pitting edema  Assessment/Plan:  Principal Problem:   Acute on chronic systolic heart failure (HCC) Active Problems:   Hyperlipidemia   Morbid obesity with BMI of 45.0-49.9, adult (HCC)   CKD (chronic kidney disease) stage 3, GFR 30-59 ml/min   Cardiomyopathy, nonischemic (HCC)   OSA (obstructive sleep apnea)   Atrial flutter (HCC)   Anemia of chronic disease   Gout   AKI (acute kidney injury) (HCC)  Acute on Chronic CHF with reduced EF: Patient with history of CHF with non-ischemic cardiomyopathy, EF 30-35%, with ICD in placepresented with increasing shortness of breath for two days with reported medication compliance, but possible nonadherence to fluid restriction. Patient's estimated dry weight is around 325-330lbs, discharge weight and weight on admission are 334lb. At home is is on Bidil 20-37.5mg  2 tabs TID, spironolactone 25mg  daily, torsemide 60mg  AM and 40mg  PM, and coreg 25mg  bid. ECHO was in June 2016 showing EF 30-35%, last RHC/LHC were in 10/15 with no significant coronary disease. EKG unchanged, CXR shows borderline cardiomegaly (unchanged) with vascular congestion. Due to multiple recent hospitalizations for CHF exacerbations further cardiac workup was  pursued. His ECHO showed a decrease in EF to 20-25% (from 30-35% one year prior), and a significantly dilated L atrium. TEE-guided DCCV with an amiodarone load was successful in converting pt to sinus rhythm. TEE showed EF 25-30%, with mildly dilated LV with diffuse hypokinesis, mildly dilated RV and mildly decreased RV systolic fxn; a LA appendage thrombus was not appreciated.  --will transition to PO torsemide 60mg  bid --continue home medication of  Bidil 20-37.5mg  2 tabs TID, spironolactone 25mg  daily, and coreg 25mg  bid; continue amiodarone 200mg  BID x 1 week, then 200mg  daily  Atrial Flutter: Patient in atrial flutter on admission, on Eliquis with some missed doses recently. Currently not a candidate for ablation due to extent of LA dilation. TEE-guided DCCV with amiodarone load successful in converting to sinus rhythm so far. Currently, patient NSR with 1st degree block. No LA appendage thrombus was appreciated. --Continue Eliquis 5mg  bid, coreg 25mg  bid, and amiodarone 200mg  bid x 1 week, then 200mg  daily --Tele  Chronic Kidney Disease, stage III: Creatinine at 2.16 on admission (baseline ~1.8, Cr on recent discharge 1.97), GFR 38. Slight elevation in Cr likely due to fluid overload, but will monitor. Currently 2.24, stable. --hold lisinopril --monitor  Gout: Patient with a history of gout on chronic allopurinol therapy. --Continue home allopurinol 100mg  daily  OSA: Patient with history of OSA, supposed to be on CPAP at night. He has a CPAP machine but irregularly uses it. He has not had much use of the CPAP while in the hospital  due to discomfort. --Provide CPAP while inpatient with education of proper use. --Plan for outpatient titration of CPAP  Dispo: Anticipated discharge in approximately today; f/u with Dr. Shirlee LatchMcLean 8/29; [facility] for labs on 8/25.   Nyra MarketGorica Mamye Bolds, MD 03/10/2016, 11:00 AM Pager: [phone]

## 2016-03-14 ENCOUNTER — Telehealth: Payer: Self-pay | Admitting: Pharmacist

## 2016-03-14 NOTE — Telephone Encounter (Signed)
Heather from [facility] called to notify clinic they will try to reach out to patient for home medication management assistance. Herbert Seta will keep in touch regarding any findings for further collaboration in patient's care.

## 2016-03-16 ENCOUNTER — Encounter: Payer: Self-pay | Admitting: Internal Medicine

## 2016-03-17 ENCOUNTER — Ambulatory Visit (HOSPITAL_COMMUNITY)
Admission: RE | Admit: 2016-03-17 | Discharge: 2016-03-17 | Disposition: A | Discharge status: Home / Self Care | Payer: Medicaid Other | Source: Ambulatory Visit | Attending: Cardiology | Admitting: Cardiology

## 2016-03-17 DIAGNOSIS — I5022 Chronic systolic (congestive) heart failure: Secondary | ICD-10-CM

## 2016-03-17 LAB — BASIC METABOLIC PANEL
ANION GAP: 11 (ref 5–15)
BUN: 33 mg/dL — ABNORMAL HIGH (ref 6–20)
CALCIUM: 9.6 mg/dL (ref 8.9–10.3)
CHLORIDE: 102 mmol/L (ref 101–111)
CO2: 28 mmol/L (ref 22–32)
Creatinine, Ser: 2.27 mg/dL — ABNORMAL HIGH (ref 0.61–1.24)
GFR calc Af Amer: 36 mL/min — ABNORMAL LOW (ref >60)
GFR calc non Af Amer: 31 mL/min — ABNORMAL LOW (ref >60)
GLUCOSE: 117 mg/dL — AB (ref 65–99)
Potassium: 3.9 mmol/L (ref 3.5–5.1)
Sodium: 141 mmol/L (ref 135–145)

## 2016-03-21 ENCOUNTER — Ambulatory Visit (HOSPITAL_COMMUNITY)
Admission: RE | Admit: 2016-03-21 | Discharge: 2016-03-21 | Disposition: A | Discharge status: Home / Self Care | Payer: Medicaid Other | Source: Ambulatory Visit | Attending: Cardiology | Admitting: Cardiology

## 2016-03-21 ENCOUNTER — Encounter (HOSPITAL_COMMUNITY): Payer: Self-pay

## 2016-03-21 VITALS — BP 120/80 | HR 83 | Wt 328.8 lb

## 2016-03-21 DIAGNOSIS — M109 Gout, unspecified: Secondary | ICD-10-CM | POA: Diagnosis not present

## 2016-03-21 DIAGNOSIS — Z79899 Other long term (current) drug therapy: Secondary | ICD-10-CM | POA: Insufficient documentation

## 2016-03-21 DIAGNOSIS — I4892 Unspecified atrial flutter: Secondary | ICD-10-CM | POA: Diagnosis not present

## 2016-03-21 DIAGNOSIS — N183 Chronic kidney disease, stage 3 unspecified: Secondary | ICD-10-CM

## 2016-03-21 DIAGNOSIS — Z6841 Body Mass Index (BMI) 40.0 and over, adult: Secondary | ICD-10-CM | POA: Diagnosis not present

## 2016-03-21 DIAGNOSIS — Z7901 Long term (current) use of anticoagulants: Secondary | ICD-10-CM | POA: Diagnosis not present

## 2016-03-21 DIAGNOSIS — Z8249 Family history of ischemic heart disease and other diseases of the circulatory system: Secondary | ICD-10-CM | POA: Insufficient documentation

## 2016-03-21 DIAGNOSIS — G4733 Obstructive sleep apnea (adult) (pediatric): Secondary | ICD-10-CM | POA: Insufficient documentation

## 2016-03-21 DIAGNOSIS — I509 Heart failure, unspecified: Secondary | ICD-10-CM | POA: Diagnosis present

## 2016-03-21 DIAGNOSIS — E785 Hyperlipidemia, unspecified: Secondary | ICD-10-CM | POA: Insufficient documentation

## 2016-03-21 DIAGNOSIS — I130 Hypertensive heart and chronic kidney disease with heart failure and stage 1 through stage 4 chronic kidney disease, or unspecified chronic kidney disease: Secondary | ICD-10-CM | POA: Insufficient documentation

## 2016-03-21 DIAGNOSIS — I5022 Chronic systolic (congestive) heart failure: Secondary | ICD-10-CM | POA: Diagnosis not present

## 2016-03-21 DIAGNOSIS — I484 Atypical atrial flutter: Secondary | ICD-10-CM | POA: Insufficient documentation

## 2016-03-21 DIAGNOSIS — I428 Other cardiomyopathies: Secondary | ICD-10-CM | POA: Insufficient documentation

## 2016-03-21 MED ORDER — CARVEDILOL 25 MG PO TABS: 25.0000 mg | ORAL_TABLET | Freq: Two times a day (BID) | ORAL | 1 refills | Status: DC

## 2016-03-21 MED ORDER — ISOSORB DINITRATE-HYDRALAZINE 20-37.5 MG PO TABS: 2.0000 | ORAL_TABLET | Freq: Three times a day (TID) | ORAL | 1 refills | Status: DC

## 2016-03-21 MED ORDER — POTASSIUM CHLORIDE CRYS ER 20 MEQ PO TBCR: 20.0000 meq | EXTENDED_RELEASE_TABLET | Freq: Two times a day (BID) | ORAL | 1 refills | Status: DC

## 2016-03-21 MED ORDER — AMIODARONE HCL 200 MG PO TABS: 200.0000 mg | ORAL_TABLET | Freq: Every day | ORAL | 1 refills | Status: DC

## 2016-03-21 MED ORDER — SPIRONOLACTONE 25 MG PO TABS: ORAL_TABLET | ORAL | 1 refills | Status: DC

## 2016-03-21 MED ORDER — APIXABAN 5 MG PO TABS: 5.0000 mg | ORAL_TABLET | Freq: Two times a day (BID) | ORAL | 1 refills | Status: DC

## 2016-03-21 MED ORDER — TORSEMIDE 20 MG PO TABS: ORAL_TABLET | ORAL | 1 refills | Status: DC

## 2016-03-21 NOTE — Patient Instructions (Signed)
Increase Torsemide to 80 mg (4 tabs) in AM and 60 mg (3 tabs) in PM  Labs in 10 days  Your physician recommends that you schedule a follow-up appointment in: 1 month

## 2016-03-22 ENCOUNTER — Encounter: Payer: Self-pay | Admitting: Internal Medicine

## 2016-03-22 ENCOUNTER — Ambulatory Visit (INDEPENDENT_AMBULATORY_CARE_PROVIDER_SITE_OTHER): Payer: Medicaid Other | Admitting: Internal Medicine

## 2016-03-22 ENCOUNTER — Telehealth: Payer: Self-pay | Admitting: Pharmacist

## 2016-03-22 VITALS — BP 126/79 | HR 91 | Temp 98.4°F | Ht 69.5 in | Wt 328.7 lb

## 2016-03-22 DIAGNOSIS — I5022 Chronic systolic (congestive) heart failure: Secondary | ICD-10-CM

## 2016-03-22 LAB — COMPREHENSIVE METABOLIC PANEL
ALBUMIN: 3.8 g/dL (ref 3.5–5.0)
ALK PHOS: 84 U/L (ref 38–126)
ALT: 37 U/L (ref 17–63)
AST: 35 U/L (ref 15–41)
Anion gap: 9 (ref 5–15)
BILIRUBIN TOTAL: 0.7 mg/dL (ref 0.3–1.2)
BUN: 26 mg/dL — AB (ref 6–20)
CALCIUM: 9.4 mg/dL (ref 8.9–10.3)
CO2: 30 mmol/L (ref 22–32)
CREATININE: 2.22 mg/dL — AB (ref 0.61–1.24)
Chloride: 103 mmol/L (ref 101–111)
GFR calc Af Amer: 37 mL/min — ABNORMAL LOW (ref >60)
GFR calc non Af Amer: 32 mL/min — ABNORMAL LOW (ref >60)
GLUCOSE: 138 mg/dL — AB (ref 65–99)
Potassium: 4 mmol/L (ref 3.5–5.1)
Sodium: 142 mmol/L (ref 135–145)
TOTAL PROTEIN: 7.4 g/dL (ref 6.5–8.1)

## 2016-03-22 LAB — TSH: TSH: 1.911 u[IU]/mL (ref 0.350–4.500)

## 2016-03-22 NOTE — Assessment & Plan Note (Addendum)
Patient is here for a hospital follow up for his exacerbation of chronic systolic heart failure. He was admitted for that reason in the setting of atypical atrial flutter, and had a TEE guided DCCV to NSR. Was not a candidate for ablation, and was started on amiodarone.  TEE showed EF of 25-30%. He was continued on the coreg, spironolactone, bidil, and torsemide. Lisinopril was held due to AKI. His discharge weight was 324 pounds. He was asked to restrict fluid and salt intake. He has followed up with cardiology yesterday, and his torsemide was increased to 80 AM and 60 PM, continued coreg 25 mg BID, and spironolactone 25 mg daily, and bidil 2 tablets TID. They continued to hold lisinopril.  Today, pt says he is doing well. Denies dyspnea at rest and at exertion. He is compliant with all of his meds, and watches his salt and fluid intake. On exam, lungs clear to auscultation, and trace pitting edema, but per pt it is better than before. Weigh was 328 lbs, and he seemed to be in NSR on auscultation.   Plan -continue current meds -Labs were ordered by cardiology yesterday, and were drawn today  -advised better cpap compliance, and weight loss  -referred to opthalmology for annual eye exam as he is on amiodarone  -Pt will follow up with them on Sept 11.

## 2016-03-22 NOTE — Patient Instructions (Signed)
Thank you for your visit today  Please continue your current medications.  Please continue to watch your fluid intake (less than 1.5 liters per day), and salt intake. Please think about wearing a CPAP   Please follow up with the heart doctor on September 11th, and you also have an appointment with your PCP in October

## 2016-03-22 NOTE — Progress Notes (Signed)
Heather from [facility] called to notify clinic she has tried to provide assistance in the home with medication management for [name] but he has refused services at this time. Herbert Seta also stated that she recommended patient transferring to a pharmacy with medication management support such as blister packaging, referral to dietician, and tele-monitoring. Patient has declined these services at this time.

## 2016-03-22 NOTE — Progress Notes (Signed)
Patient ID: Eric Murillo, male   DOB: [birth year], 55 y.o.   MRN: [id number]    Advanced Heart Failure Clinic Note  PCP: Dr. Glenard Haring Cardiology: Dr Shirlee Latch  55 yo with history of morbid obesity, hyperlipidemia, HTN, atrial flutter (s/p TEE DC-CV 04/03/14 and again in 8/17), NICM with chronic systolic HF and severe OSA but unable to tolerate CPAP.    He was admitted in 04/2014 with increased exertional dyspnea and chest tightness.  RHC/LHC showed elevated filling pressures, preserved cardiac output, and no significant coronary disease.  He was diuresed and discharged home. TEE (9/15) with EF 25-30% with global hypokinesis, mild to moderate MR, mildly decreased RV systolic function.   He underwent work-up for cardiac amyloid. SPEP negative. Fat pad biopsy 6/16 with benign adipose tissue. Unable to get cMRI due to size.  Echo in 8/17 showed EF 20-25%, diffuse hypokinesis, PASP 48 mmHg.  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    He was admitted in 8/17 with acute on chronic systolic CHF in setting of atypical atrial flutter.  He was diuresed and had TEE-guided DCCV to NSR.  Amiodarone was begun.   He returns for followup today. He remains in NSR.  No dyspnea walking around on flat ground.  No lightheadedness or chest pain.   No palpitations since discharge.  He sleeps kneeling and has done so chronically.  Weight is down 5 lbs compared to prior appointment.   Corevue: Stable thoracic impedance   Labs (5/14): K 3.7, creatinine 1.5 Labs (12/14): K 4.2, creatinine 1.27, LDL 84, HDL 31, TSH normal Labs (3/15): K 4.6, creatinine 1.29 Labs (10/07/13) K 3.9 Creatinine 1.44 Labs (11/14/13) K 4.2, creatinine 1.48 Labs (12/05/13) K 4.8, creatinine 2.01  Labs (12/2013): K 4.3, creatinine 1.85, BUN 36, pro-BNP 884 Labs (7/15): K 3.5, creatinine 1.53 Labs (9/15): K 4, creatinine 1.9.   Labs (10/15): K 4.3, creatinine 1.7 Labs (11/15): K 4.4, creatinine 2.19 Labs (06/29/2014) K 4.0 Creatinine 1.96  Labs (08/21/2014) K 3.9  Creatinine 1.78 Labs (08/04/2015) : K 4.2 Creatinine 1.66 BNP 173 Labs (6/17): K 4.3, creatinine 1.87 Labs (8/17): K 3.9, creatinine 2.27, HCT 41.2  ECG (2/17): NSR, 1st degree AV block, LAFB, poor RWP, low voltage.   Past Medical History: 1. Nonischemic cardiomyopathy: Echo (3/12) with EF 30-35% and mildly dilated LV, diffuse LV hypokinesis, moderate MR, PA systolic pressure 55 mmHg.  Left and right heart cath (3/12): No angiographic CAD; mean RA 20, PA 76/41, mean PCWP 38, CI 2.1.  ANA, SPEP, and HIV negative.  TSH normal.  Denies drug abuse, heavy ETOH intake.  No family history of cardiomyopathy.  Cardiomyopathy may be due to long-standing HTN.  Echo (6/13): EF 35-40%, moderate LV dilation, moderate to severe LAE, PA systolic pressure 52 mmHg.  Unable to fit in magnet for cardiac MRI.  Echo (8/14) with EF 45%, moderately dilated LV, mild LVH, normal RV, PA systolic pressure 49 mmHg. TEE (9/15) with EF 25-30% with global hypokinesis, mild to moderate MR, mildly decreased RV systolic function. LHC/RHC (10/15) with no significant coronary disease; mean RA 14, PA 65/25 mean 42, unable to obtain PCWP but LVEDP 24, CI 2.69.  Echo (6/16) with EF 30-35%, PA systolic pressure 62 mmHg, RV normal size and systolic function. St Jude ICD.  - Echo (8/17): EF 20-25%, mild LV dilation, diffuse hypokinesis, PASP 48 mmHg.  2. ERECTILE DYSFUNCTION 3. HYPERTENSION  4. CKD: Stage III 5. DYSLIPIDEMIA  6. Obesity 7. Hyperlipidemia 8. Gout 9. OSA: Severe  on 6/13 sleep study. Unable to use CPAP.  10. Sialolithiasis 11. Atypical Atrial Flutter: TEE-DC-CV (04/03/14) which was successful; TEE-DCCV in 8/17 was successful.  Now on amiodarone.    Family History: No history of cardiomyopathy No history of cancer among first degree relatives father - died of MI 55(70s)  mother - HTN  Social History: Lives girlfriend.  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 tobacco - none alcohol - none drugs - none  ROS:  All systems reviewed and negative except as per HPI.   Current Outpatient Prescriptions  Medication Sig Dispense Refill  . allopurinol (ZYLOPRIM) 100 MG tablet TAKE 1 TABLET BY MOUTH TWO TIMES DAILY 60 tablet 2  . amiodarone (PACERONE) 200 MG tablet Take 1 tablet (200 mg total) by mouth daily. 90 tablet 1  . apixaban (ELIQUIS) 5 MG TABS tablet Take 1 tablet (5 mg total) by mouth 2 (two) times daily. 180 tablet 1  . carvedilol (COREG) 25 MG tablet Take 1 tablet (25 mg total) by mouth 2 (two) times daily with a meal. 180 tablet 1  . isosorbide-hydrALAZINE (BIDIL) 20-37.5 MG tablet Take 2 tablets by mouth 3 (three) times daily. 540 tablet 1  . oxyCODONE-acetaminophen (PERCOCET) 10-325 MG tablet Take 1 tablet by mouth every 6 (six) hours as needed for pain. 90 tablet 0  . potassium chloride SA (KLOR-CON M20) 20 MEQ tablet Take 1 tablet (20 mEq total) by mouth 2 (two) times daily. 180 tablet 1  . spironolactone (ALDACTONE) 25 MG tablet TAKE 1 TABLET (25 MG TOTAL) BY MOUTH DAILY. 90 tablet 1  . torsemide (DEMADEX) 20 MG tablet Take 4 tabs in AM and 3 tabs in PM 630 tablet 1  . colchicine 0.6 MG tablet Take 0.6 mg by mouth 2 (two) times daily as needed (gouty flare). Reported on 01/18/2016     No current facility-administered medications for this encounter.     Vitals:   03/21/16 1100  BP: 120/80  Pulse: 83  SpO2: 98%  Weight: (!) 328 lb 12 oz (149.1 kg)   Wt Readings from Last 3 Encounters:  03/21/16 (!) 328 lb 12 oz (149.1 kg)  03/10/16 (!) 324 lb 1.6 oz (147 kg)  02/29/16 (!) 333 lb 9.6 oz (151.3 kg)    General: Well appearing, NAD.  Neck:  Thick, JVP 8-9 cm. No thyromegaly or thyroid nodule. Multiple skin tags present.  Lungs: CTAB, normal effort. CV: Nondisplaced PMI.  Heart regular S1/S2, no murmur.  No carotid bruit.   Abdomen: Obese, tight, NT, mildly distended, no HSM. No bruits or masses. +BS  Neurologic: Alert and oriented x 3.  Psych: Normal affect. Extremities: No clubbing  or cyanosis. 1+ edema 1/2 to knees bilaterally.   Assessment/Plan:  1. Chronic systolic heart failure: Nonischemic cardiomyopathy, likely related to HTN.  Echo in 8/14 with EF 45% but EF down to 25-30% on TEE while in atrial flutter (03/2014).  Echo (1/16) with EF ~25% and echo in 6/16 with EF 30-35%.  No cardiac MRI done with elevated creatinine and size.  There was concern for cardiac amyloidosis.  However, negative SPEP and abdominal fat pad biopsy negative. Low voltage on ECG may be due to obesity and not amyloidosis. Would not proceed with endomyocardial biopsy at this time.  Most recent echo in 8/17 with EF 20-25%.  Was in hospital earlier this month with CHF exacerbation in setting of atypical atrial flutter. He is mildly volume overloaded on exam today though Corevue looks fairly stable.   -  Increase torsemide to 80 qam/60 qpm.  BMET in 10 days.  - Continue Coreg 25 mg BID, spironolactone 25 mg daily and Bidil 2 tabs TID.   - Off lisinopril with elevated creatinine.  2. Low voltage on ECG: SPEP and fat pad biopsy negative. See above.  Consensus thus far is this is likely due to obesity rather than amyloidosis. 3. CKD III: BMET today  4. HTN: Stable on current regimen.  5. OSA: Encouraged to wear CPAP nightly. 6. Morbid Obesity: Having trouble losing weight.  - Has thought about bariatric surgery.  7. Atrial flutter:  Atypical atrial flutter in 9/15 and again in 8/17. Remains in NSR s/p DCCV. CHADSVASC = 2 (HTN, CHF).  - Continue eliquis 5 mg BID. Denies bleeding problems.  - Seen by EP, flutter not felt to be easily amenable to catheter ablation.  - Continue amiodarone to maintain NSR.  Check CMET/TSH, will need yearly eye exams.  8. Gout: Stable.    [name] 03/22/2016

## 2016-03-22 NOTE — Progress Notes (Signed)
   CC: HFrEF HPI: [name] is a 55 y.o. man with PMH noted below here for follow up regarding HFrEF  Please see Problem List/A&P for the status of the patient's chronic medical problems   Past Medical History:  Diagnosis Date  . AICD (automatic cardioverter/defibrillator) present   . Anemia of chronic disease   . Arthritis   . Atrial flutter (HCC)    DCCV 9/15  . Chest pain on exertion   . Chronic systolic congestive heart failure, NYHA class 2 (HCC)    EF 20-25% 1/16  . CKD (chronic kidney disease) stage 3, GFR 30-59 ml/min   . Dyslipidemia   . Gout    Of big toe  . Hyperglycemia   . Hyperlipidemia   . Hypertension   . Morbid obesity with BMI of 45.0-49.9, adult (HCC)   . Non-ischemic cardiomyopathy (HCC)   . Organic erectile dysfunction   . OSA (obstructive sleep apnea)   . Pilonidal cyst   . Submandibular sialolithiasis    Right    Review of Systems: Denies fevers, weight loss, fatigue Denies DOE, CP, SOB at rest,  Denies n/v/abd pain, diarrhea Has been watching fluid and salt intake  Physical Exam: Vitals:   03/22/16 0942  BP: 126/79  Pulse: 91  Temp: 98.4 F (36.9 C)  TempSrc: Oral  SpO2: 97%  Weight: (!) 328 lb 11.2 oz (149.1 kg)  Height: 5' 9.5" (1.765 m)    General: A&O, in NAD, obese CV: RRR, normal s1, s2, no m/r/g, Resp: equal and symmetric breath sounds, no wheezing or crackles  Abdomen: soft, nontender, nondistended, +BS Extremities: trace edema   Assessment & Plan:   See encounters tab for problem based medical decision making. Patient discussed with Dr. Rogelia Boga

## 2016-03-24 NOTE — Progress Notes (Signed)
Internal Medicine Clinic Attending  Case discussed with Dr. Saraiya at the time of the visit.  We reviewed the resident's history and exam and pertinent patient test results.  I agree with the assessment, diagnosis, and plan of care documented in the resident's note.  

## 2016-03-25 ENCOUNTER — Other Ambulatory Visit (HOSPITAL_COMMUNITY): Payer: Self-pay | Admitting: Cardiology

## 2016-03-30 ENCOUNTER — Telehealth: Payer: Self-pay | Admitting: Internal Medicine

## 2016-03-30 DIAGNOSIS — M19079 Primary osteoarthritis, unspecified ankle and foot: Secondary | ICD-10-CM

## 2016-03-30 NOTE — Telephone Encounter (Signed)
Refill for pain medication

## 2016-03-30 NOTE — Telephone Encounter (Signed)
Pt has question for [employer]

## 2016-03-30 NOTE — Telephone Encounter (Signed)
Last office visit: 03/22/16 Last UDS: 01/01/2015 Last Refill:  02/27/16 #90 Next appt: 04/26/16 w/pcp  Updated Medicaid Prior Approval Criteria for Opioid Analgesics Effective 03/19/2016, prior approval will be required for opioid analgesic (including tramadol) doses for [place] Medicaid which: 1. Exceeds 120mg  of morphine equivalents (MME) per day 2. Are greater that a 14-day supply of any opioid, or 3. Are non-preferred opioid products on the [place] Medicaid Preferred Drug List (PDL)   A form must not be completed for medicaid patients-it has been placed in your box-please see me if you have any questions.[name], Rhianna Raulerson Cassady9/7/20179:46 AM

## 2016-04-03 ENCOUNTER — Ambulatory Visit (HOSPITAL_COMMUNITY)
Admission: RE | Admit: 2016-04-03 | Discharge: 2016-04-03 | Disposition: A | Discharge status: Home / Self Care | Payer: Medicaid Other | Source: Ambulatory Visit | Attending: Cardiology | Admitting: Cardiology

## 2016-04-03 DIAGNOSIS — I5023 Acute on chronic systolic (congestive) heart failure: Secondary | ICD-10-CM | POA: Diagnosis not present

## 2016-04-03 LAB — BASIC METABOLIC PANEL
ANION GAP: 10 (ref 5–15)
BUN: 27 mg/dL — ABNORMAL HIGH (ref 6–20)
CALCIUM: 9.2 mg/dL (ref 8.9–10.3)
CO2: 31 mmol/L (ref 22–32)
CREATININE: 2.32 mg/dL — AB (ref 0.61–1.24)
Chloride: 103 mmol/L (ref 101–111)
GFR, EST AFRICAN AMERICAN: 35 mL/min — AB (ref >60)
GFR, EST NON AFRICAN AMERICAN: 30 mL/min — AB (ref >60)
Glucose, Bld: 125 mg/dL — ABNORMAL HIGH (ref 65–99)
Potassium: 4.7 mmol/L (ref 3.5–5.1)
SODIUM: 144 mmol/L (ref 135–145)

## 2016-04-03 MED ORDER — OXYCODONE-ACETAMINOPHEN 10-325 MG PO TABS: 1.0000 | ORAL_TABLET | Freq: Four times a day (QID) | ORAL | 0 refills | Status: DC | PRN

## 2016-04-03 NOTE — Telephone Encounter (Signed)
Rx ready for pick up ; pt called. 

## 2016-04-03 NOTE — Telephone Encounter (Signed)
Forms placed in the wrong box for MD completion.  Will place forms in the correct box.[name], Darlene Cassady9/11/201710:41 AM

## 2016-04-04 ENCOUNTER — Telehealth: Payer: Self-pay

## 2016-04-04 ENCOUNTER — Other Ambulatory Visit: Payer: Self-pay | Admitting: Internal Medicine

## 2016-04-04 ENCOUNTER — Ambulatory Visit (INDEPENDENT_AMBULATORY_CARE_PROVIDER_SITE_OTHER): Payer: Medicaid Other

## 2016-04-04 ENCOUNTER — Encounter: Payer: Self-pay | Admitting: Internal Medicine

## 2016-04-04 DIAGNOSIS — Z95810 Presence of automatic (implantable) cardiac defibrillator: Secondary | ICD-10-CM | POA: Diagnosis not present

## 2016-04-04 DIAGNOSIS — I5022 Chronic systolic (congestive) heart failure: Secondary | ICD-10-CM | POA: Diagnosis not present

## 2016-04-04 NOTE — Progress Notes (Signed)
EPIC Encounter for ICM Monitoring  Patient Name: Eric Murillo is a 55 y.o. male Date: 04/04/2016 Primary Care Physican: Darreld Mclean, MD Primary Cardiologist: Shirlee Latch Electrophysiologist: Graciela Husbands Dry Weight: unknown       Attempted ICM call and unable to reach.  Transmission reviewed.   Thoracic impedance normal   Follow-up plan: ICM clinic phone appointment on 05/30/2016.  HF clinic on 04/25/2016  Copy of ICM check sent to device physician.   ICM trend: 04/04/2016       Karie Soda, RN 04/04/2016 3:31 PM

## 2016-04-04 NOTE — Telephone Encounter (Signed)
Remote ICM transmission received.  Attempted patient call and person answering phone stated he was not home.    

## 2016-04-04 NOTE — Telephone Encounter (Signed)
Pt calling about refill

## 2016-04-04 NOTE — Telephone Encounter (Signed)
Talked to [facility] - doctor has to complete form then will let pt know as soon as possible; pt informed. Pt stated ok.

## 2016-04-07 ENCOUNTER — Telehealth: Payer: Self-pay

## 2016-04-07 NOTE — Telephone Encounter (Signed)
Left message with patient's mother for him to call [employer] regarding HF study.

## 2016-04-10 NOTE — Telephone Encounter (Addendum)
Request submitted online via [employer] [place] [employer]-request approved- pt notified.Eric Spittle Cassady9/18/20173:27 PM    [id number] [facility] [id number] T [name] 04/10/2016  APPROVED 04/10/2016 - 10/07/2016

## 2016-04-25 ENCOUNTER — Ambulatory Visit (HOSPITAL_COMMUNITY)
Admission: RE | Admit: 2016-04-25 | Discharge: 2016-04-25 | Disposition: A | Discharge status: Home / Self Care | Payer: Medicaid Other | Source: Ambulatory Visit | Attending: Cardiology | Admitting: Cardiology

## 2016-04-25 VITALS — BP 126/78 | HR 91 | Wt 335.8 lb

## 2016-04-25 DIAGNOSIS — I130 Hypertensive heart and chronic kidney disease with heart failure and stage 1 through stage 4 chronic kidney disease, or unspecified chronic kidney disease: Secondary | ICD-10-CM | POA: Diagnosis not present

## 2016-04-25 DIAGNOSIS — Z7901 Long term (current) use of anticoagulants: Secondary | ICD-10-CM | POA: Insufficient documentation

## 2016-04-25 DIAGNOSIS — G4733 Obstructive sleep apnea (adult) (pediatric): Secondary | ICD-10-CM | POA: Diagnosis not present

## 2016-04-25 DIAGNOSIS — M109 Gout, unspecified: Secondary | ICD-10-CM | POA: Diagnosis not present

## 2016-04-25 DIAGNOSIS — N183 Chronic kidney disease, stage 3 unspecified: Secondary | ICD-10-CM

## 2016-04-25 DIAGNOSIS — I428 Other cardiomyopathies: Secondary | ICD-10-CM

## 2016-04-25 DIAGNOSIS — I484 Atypical atrial flutter: Secondary | ICD-10-CM

## 2016-04-25 DIAGNOSIS — E785 Hyperlipidemia, unspecified: Secondary | ICD-10-CM | POA: Diagnosis not present

## 2016-04-25 DIAGNOSIS — Z79899 Other long term (current) drug therapy: Secondary | ICD-10-CM | POA: Diagnosis not present

## 2016-04-25 DIAGNOSIS — I5022 Chronic systolic (congestive) heart failure: Secondary | ICD-10-CM

## 2016-04-25 DIAGNOSIS — M1000 Idiopathic gout, unspecified site: Secondary | ICD-10-CM

## 2016-04-25 DIAGNOSIS — Z6841 Body Mass Index (BMI) 40.0 and over, adult: Secondary | ICD-10-CM

## 2016-04-25 LAB — BASIC METABOLIC PANEL
ANION GAP: 8 (ref 5–15)
BUN: 20 mg/dL (ref 6–20)
CHLORIDE: 105 mmol/L (ref 101–111)
CO2: 29 mmol/L (ref 22–32)
CREATININE: 1.92 mg/dL — AB (ref 0.61–1.24)
Calcium: 8.9 mg/dL (ref 8.9–10.3)
GFR calc non Af Amer: 38 mL/min — ABNORMAL LOW (ref >60)
GFR, EST AFRICAN AMERICAN: 44 mL/min — AB (ref >60)
Glucose, Bld: 129 mg/dL — ABNORMAL HIGH (ref 65–99)
POTASSIUM: 3.4 mmol/L — AB (ref 3.5–5.1)
SODIUM: 142 mmol/L (ref 135–145)

## 2016-04-25 LAB — BRAIN NATRIURETIC PEPTIDE: B NATRIURETIC PEPTIDE 5: 470.4 pg/mL — AB (ref 0.0–100.0)

## 2016-04-25 MED ORDER — METOLAZONE 2.5 MG PO TABS: 2.5000 mg | ORAL_TABLET | Freq: Every day | ORAL | 1 refills | Status: DC | PRN

## 2016-04-25 MED ORDER — TORSEMIDE 20 MG PO TABS: 80.0000 mg | ORAL_TABLET | Freq: Two times a day (BID) | ORAL | 3 refills | Status: DC

## 2016-04-25 NOTE — Patient Instructions (Signed)
INCREASE Torsemide to 80 mg (4 tabs) twice daily.  Take ONE metolazone 2.5 mg tablet ONCE TOMORROW.  Take 2 potassium tabs twice daily tomorrow ONLY, then reduce dose back down to 1 tab twice daily all other days.  Routine lab work today. Will notify you of abnormal results, otherwise no news is good news!  Return Monday morning 05/01/2016 for repeat labs.  Follow up 2 weeks with Otilio Saber, Certified Physician's Assistant.  Do the following things EVERYDAY: 1) Weigh yourself in the morning before breakfast. Write it down and keep it in a log. 2) Take your medicines as prescribed 3) Eat low salt foods-Limit salt (sodium) to 2000 mg per day.  4) Stay as active as you can everyday 5) Limit all fluids for the day to less than 2 liters

## 2016-04-25 NOTE — Progress Notes (Signed)
Patient ID: Eric Murillo, male   DOB: March 05, 1961, 55 y.o.   MRN: [id number]    Advanced Heart Failure Clinic Note  PCP: Dr. Glenard Haring Cardiology: Dr Shirlee Latch  55 yo with history of morbid obesity, hyperlipidemia, HTN, atrial flutter (s/p TEE DC-CV 04/03/14 and again in 8/17), NICM with chronic systolic HF and severe OSA but unable to tolerate CPAP.    He was admitted in 04/2014 with increased exertional dyspnea and chest tightness.  RHC/LHC showed elevated filling pressures, preserved cardiac output, and no significant coronary disease.  He was diuresed and discharged home. TEE (9/15) with EF 25-30% with global hypokinesis, mild to moderate MR, mildly decreased RV systolic function.   He underwent work-up for cardiac amyloid. SPEP negative. Fat pad biopsy 6/16 with benign adipose tissue. Unable to get cMRI due to size.  Echo in 8/17 showed EF 20-25%, diffuse hypokinesis, PASP 48 mmHg.  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    He was admitted in 8/17 with acute on chronic systolic CHF in setting of atypical atrial flutter.  He was diuresed and had TEE-guided DCCV to NSR.  Amiodarone was begun.   He returns today for regular follow up. At last visit torsemide increased. Up 7 lbs from last visit. At home has gained ~ 7 lbs as well, thinks over the past month. Mostly sedentary. On good days can make it around a grocery store, more limited by joint pain then SOB. Sleeps with knees on floor and chest and head on couch, has done so chronically. Chronic orthopnea. Occasionally gets lightheadedness with rapid standing, not marked or limiting. Worse when he takes medicine on an empty stomach. Drinking 1.5 -> 2 L a day. Had a hamburger last night from a restaurant, says it didn't taste salty.   Corevue: Thoracic impedence below threshold.  Has been volume overloaded   Labs (5/14): K 3.7, creatinine 1.5 Labs (12/14): K 4.2, creatinine 1.27, LDL 84, HDL 31, TSH normal Labs (3/15): K 4.6, creatinine 1.29 Labs (10/07/13) K  3.9 Creatinine 1.44 Labs (11/14/13) K 4.2, creatinine 1.48 Labs (12/05/13) K 4.8, creatinine 2.01  Labs (12/2013): K 4.3, creatinine 1.85, BUN 36, pro-BNP 884 Labs (7/15): K 3.5, creatinine 1.53 Labs (9/15): K 4, creatinine 1.9.   Labs (10/15): K 4.3, creatinine 1.7 Labs (11/15): K 4.4, creatinine 2.19 Labs (06/29/2014) K 4.0 Creatinine 1.96  Labs (08/21/2014) K 3.9 Creatinine 1.78 Labs (08/04/2015) : K 4.2 Creatinine 1.66 BNP 173 Labs (6/17): K 4.3, creatinine 1.87 Labs (8/17): K 3.9, creatinine 2.27, HCT 41.2  ECG (2/17): NSR, 1st degree AV block, LAFB, poor RWP, low voltage.   Past Medical History: 1. Nonischemic cardiomyopathy: Echo (3/12) with EF 30-35% and mildly dilated LV, diffuse LV hypokinesis, moderate MR, PA systolic pressure 55 mmHg.  Left and right heart cath (3/12): No angiographic CAD; mean RA 20, PA 76/41, mean PCWP 38, CI 2.1.  ANA, SPEP, and HIV negative.  TSH normal.  Denies drug abuse, heavy ETOH intake.  No family history of cardiomyopathy.  Cardiomyopathy may be due to long-standing HTN.  Echo (6/13): EF 35-40%, moderate LV dilation, moderate to severe LAE, PA systolic pressure 52 mmHg.  Unable to fit in magnet for cardiac MRI.  Echo (8/14) with EF 45%, moderately dilated LV, mild LVH, normal RV, PA systolic pressure 49 mmHg. TEE (9/15) with EF 25-30% with global hypokinesis, mild to moderate MR, mildly decreased RV systolic function. LHC/RHC (10/15) with no significant coronary disease; mean RA 14, PA 65/25 mean 42, unable  to obtain PCWP but LVEDP 24, CI 2.69.  Echo (6/16) with EF 30-35%, PA systolic pressure 62 mmHg, RV normal size and systolic function. [employer] Jude ICD.  - Echo (8/17): EF 20-25%, mild LV dilation, diffuse hypokinesis, PASP 48 mmHg.  2. ERECTILE DYSFUNCTION 3. HYPERTENSION  4. CKD: Stage III 5. DYSLIPIDEMIA  6. Obesity 7. Hyperlipidemia 8. Gout 9. OSA: Severe on 6/13 sleep study. Unable to use CPAP.  10. Sialolithiasis 11. Atypical Atrial Flutter: TEE-DC-CV  (04/03/14) which was successful; TEE-DCCV in 8/17 was successful.  Now on amiodarone.    Family History: No history of cardiomyopathy No history of cancer among first degree relatives father - died of MI 50(70s)  mother - HTN  Social History: Lives girlfriend.  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 tobacco - none alcohol - none drugs - none  ROS: All systems reviewed and negative except as per HPI.   Current Outpatient Prescriptions  Medication Sig Dispense Refill  . allopurinol (ZYLOPRIM) 100 MG tablet TAKE 1 TABLET BY MOUTH TWO TIMES DAILY 60 tablet 2  . amiodarone (PACERONE) 200 MG tablet Take 1 tablet (200 mg total) by mouth daily. 90 tablet 1  . apixaban (ELIQUIS) 5 MG TABS tablet Take 1 tablet (5 mg total) by mouth 2 (two) times daily. 180 tablet 1  . carvedilol (COREG) 25 MG tablet Take 1 tablet (25 mg total) by mouth 2 (two) times daily with a meal. 180 tablet 1  . colchicine 0.6 MG tablet Take 0.6 mg by mouth 2 (two) times daily as needed (gouty flare). Reported on 01/18/2016    . isosorbide-hydrALAZINE (BIDIL) 20-37.5 MG tablet Take 2 tablets by mouth 3 (three) times daily. 540 tablet 1  . oxyCODONE-acetaminophen (PERCOCET) 10-325 MG tablet Take 1 tablet by mouth every 6 (six) hours as needed for pain. 90 tablet 0  . potassium chloride SA (KLOR-CON M20) 20 MEQ tablet Take 1 tablet (20 mEq total) by mouth 2 (two) times daily. 180 tablet 1  . spironolactone (ALDACTONE) 25 MG tablet TAKE 1 TABLET (25 MG TOTAL) BY MOUTH DAILY. 90 tablet 1  . torsemide (DEMADEX) 20 MG tablet Take 4 tabs in AM and 3 tabs in PM 630 tablet 1   No current facility-administered medications for this encounter.     Vitals:   04/25/16 1032  BP: 126/78  Pulse: 91  SpO2: 95%  Weight: (!) 335 lb 12.8 oz (152.3 kg)   Wt Readings from Last 3 Encounters:  04/25/16 (!) 335 lb 12.8 oz (152.3 kg)  03/22/16 (!) 328 lb 11.2 oz (149.1 kg)  03/21/16 (!) 328 lb 12 oz (149.1 kg)    General: Well  appearing, NAD.  Neck:  Thick, JVP 11-12 cm. No thyromegaly or thyroid nodule. Multiple skin tags present.  Lungs: Mildly diminished basilar sounds. CV: Nondisplaced PMI.  Heart regular S1/S2, no murmur.  No carotid bruit.   Abdomen: Obese, tight, NT, mild/mod distended, no HSM. No bruits or masses. +BS  Neurologic: Alert and oriented x 3.  Psych: Normal affect. Extremities: No clubbing or cyanosis. 1-2+ edema 1/2 to knees bilaterally.   Assessment/Plan:  1. Chronic systolic heart failure: Nonischemic cardiomyopathy, likely related to HTN.  Echo in 8/14 with EF 45% but EF down to 25-30% on TEE while in atrial flutter (03/2014).  Echo (1/16) with EF ~25% and echo in 6/16 with EF 30-35%.  No cardiac MRI done with elevated creatinine and size.  There was concern for cardiac amyloidosis.  However, negative SPEP  and abdominal fat pad biopsy negative. Low voltage on ECG may be due to obesity and not amyloidosis. Would not proceed with endomyocardial biopsy at this time.  Most recent echo in 8/17 with EF 20-25%.  Was in hospital 02/2016 with CHF exacerbation in setting of atypical atrial flutter.  - He is volume overload by exam and optivol.  - Increase torsemide to 80 BID. BMET/BNP today and repeat BMET Monday.   - Take 2.5 mg metolazone tomorrow with extra 40 meq of potassium.  Repeat once weekly AS NEEDED. - Continue Coreg 25 mg BID, spironolactone 25 mg daily and Bidil 2 tabs TID.   - Off lisinopril with elevated creatinine.  2. Low voltage on ECG: SPEP and fat pad biopsy negative. See above.  Consensus thus far is this is likely due to obesity rather than amyloidosis. 3. CKD III: BMET today  4. HTN:  - Stable on current regimen.   5. OSA:  - Encouraged to wear CPAP nightly. 6. Morbid Obesity:  - Needs to lose weight but not very active. Limited by joint pain.  7. Atrial flutter:  Atypical atrial flutter in 9/15 and again in 8/17. s/p DCCV. CHADSVASC = 2 (HTN, CHF).  - Remains in NSR by EKG and  Corvue.  - Continue eliquis 5 mg BID. Denies bleeding problems.  - Seen by EP, flutter not felt to be easily amenable to catheter ablation.  - Continue amiodarone to maintain NSR.   - CMET/TSH stable 02/2016. Needs yearly eye exams.  8. Gout:  - Per PCP  Graciella Freer, PA-C 04/25/2016   Total time spent > 25 minutes. Over half that spent discussing the above.

## 2016-04-26 ENCOUNTER — Encounter: Payer: Self-pay | Admitting: Internal Medicine

## 2016-04-26 ENCOUNTER — Telehealth (HOSPITAL_COMMUNITY): Payer: Self-pay

## 2016-04-26 ENCOUNTER — Ambulatory Visit (INDEPENDENT_AMBULATORY_CARE_PROVIDER_SITE_OTHER): Payer: Medicaid Other | Admitting: Internal Medicine

## 2016-04-26 VITALS — BP 126/84 | HR 87 | Temp 98.4°F | Wt 337.4 lb

## 2016-04-26 DIAGNOSIS — Z7901 Long term (current) use of anticoagulants: Secondary | ICD-10-CM | POA: Diagnosis not present

## 2016-04-26 DIAGNOSIS — M1000 Idiopathic gout, unspecified site: Secondary | ICD-10-CM

## 2016-04-26 DIAGNOSIS — M1990 Unspecified osteoarthritis, unspecified site: Secondary | ICD-10-CM

## 2016-04-26 DIAGNOSIS — I5022 Chronic systolic (congestive) heart failure: Secondary | ICD-10-CM

## 2016-04-26 DIAGNOSIS — Z79899 Other long term (current) drug therapy: Secondary | ICD-10-CM

## 2016-04-26 DIAGNOSIS — Z23 Encounter for immunization: Secondary | ICD-10-CM

## 2016-04-26 DIAGNOSIS — M109 Gout, unspecified: Secondary | ICD-10-CM

## 2016-04-26 DIAGNOSIS — G4733 Obstructive sleep apnea (adult) (pediatric): Secondary | ICD-10-CM

## 2016-04-26 DIAGNOSIS — Z79891 Long term (current) use of opiate analgesic: Secondary | ICD-10-CM | POA: Diagnosis not present

## 2016-04-26 DIAGNOSIS — I4892 Unspecified atrial flutter: Secondary | ICD-10-CM

## 2016-04-26 MED ORDER — POTASSIUM CHLORIDE CRYS ER 20 MEQ PO TBCR: 40.0000 meq | EXTENDED_RELEASE_TABLET | Freq: Two times a day (BID) | ORAL | 1 refills | Status: DC

## 2016-04-26 NOTE — Assessment & Plan Note (Signed)
Flu shot given today

## 2016-04-26 NOTE — Assessment & Plan Note (Signed)
Patient with chronic osteoarthritic pain requiring management with Percocet 10-325 mg q6h as needed. He reports taking this 4 times a day only as needed. It does provide relief and allows him to complete his daily activities. He has not taken his pain medication this morning.  Did not check UDS today as patient states he has not taken pain meds today. -Continue current management -UDS on follow up

## 2016-04-26 NOTE — Progress Notes (Signed)
CC: CHF  HPI:  [name] is a 55 y.o. male with PMH as listed below who presents for follow up management of his NICM CHF, Atrial Flutter, HTN, HLD, OSA, Chronic Pain, and Gout.  CHF: Follows with Cardiology. Seen yesterday and determined to be volume overloaded. He was instructed to increase his Torsemide to 80 mg BID today and take Metolazone 2.5 mg once today. Patient states he has not taken these this morning as he knew he would have to urinate too frequently while in clinic. He states he will take these when he returns home and the second torsemide dose this evening. He will also increase his potassium to help prevent hypokalemia. He does feel like he has increased fluid on board with swelling in his legs and SOB when walking a few steps which is worse than his baseline.   Atrial Flutter: Admitted in August 2017 for CHF exacerbation and found to have returned to atrial flutter rhythm, previously sinus after DCCV. He did undergo a second DCCV in August with return to sinus rhythm. EKG yesterday with Cardiology does show that he is in sinus rhythm with 1st degree AV block and occasional PVCs. Patient denies any CP or palpitations.  OSA: Patient admits that he is not wearing his CPAP often. He sleeps with his knees on the floor and chest and head on the cough. He says he has the right CPAP equipment but does not feel he has a good grasp on how to properly use it. He will try to follow up with the sleep specialists for clarification. He denies excess daytime somnolence.  Gout: Patient takes Allopurinol 100 mg BID for prophylaxis and has Colchicine 0.6 mg BID prn for acute flares. He states he has not had a flare in quite some time. Last uric acid from 06/2014 was 10.9.  Chronic Pain: Patient with chronic osteoarthritic pain requiring management with Percocet 10-325 mg q6h as needed. He reports taking this 4 times a day only as needed. It does provide relief and allows him to complete  his daily activities. He has not taken his pain medication this morning.  Patient has agreed to have the flu shot today.  Past Medical History:  Diagnosis Date  . AICD (automatic cardioverter/defibrillator) present   . Anemia of chronic disease   . Arthritis   . Atrial flutter (HCC)    DCCV 9/15  . Chest pain on exertion   . Chronic systolic congestive heart failure, NYHA class 2 (HCC)    EF 20-25% 1/16  . CKD (chronic kidney disease) stage 3, GFR 30-59 ml/min   . Dyslipidemia   . Gout    Of big toe  . Hyperglycemia   . Hyperlipidemia   . Hypertension   . Morbid obesity with BMI of 45.0-49.9, adult (HCC)   . Non-ischemic cardiomyopathy (HCC)   . Organic erectile dysfunction   . OSA (obstructive sleep apnea)   . Pilonidal cyst   . Submandibular sialolithiasis    Right    Review of Systems:   Review of Systems  Constitutional: Negative for chills, diaphoresis and fever.  Respiratory: Positive for shortness of breath. Negative for cough, sputum production and wheezing.   Cardiovascular: Positive for orthopnea and leg swelling. Negative for chest pain and palpitations.  Gastrointestinal: Negative for abdominal pain.  Musculoskeletal: Negative for myalgias.     Physical Exam:  Vitals:   04/26/16 1320  BP: 126/84  Pulse: 87  Temp: 98.4 F (36.9 C)  TempSrc: Oral  SpO2: 97%  Weight: (!) 337 lb 6.4 oz (153 kg)   Physical Exam  Constitutional: He is oriented to person, place, and time. He appears well-developed and well-nourished. No distress.  HENT:  Head: Normocephalic and atraumatic.  Cardiovascular: Normal rate and regular rhythm.   Pulmonary/Chest: Effort normal. No respiratory distress. He has no wheezes. He has no rales.  Musculoskeletal: He exhibits edema. He exhibits no tenderness.  Neurological: He is alert and oriented to person, place, and time.    Assessment & Plan:   See Encounters Tab for problem based charting.  Patient discussed with Dr.  Josem KaufmannKlima

## 2016-04-26 NOTE — Assessment & Plan Note (Signed)
Admitted in August 2017 for CHF exacerbation and found to have returned to atrial flutter rhythm, previously sinus after DCCV. He did undergo a second DCCV in August with return to sinus rhythm. EKG yesterday with Cardiology does show that he is in sinus rhythm with 1st degree AV block and occasional PVCs. Patient denies any CP or palpitations.  Appears to be in sinus rhythm on exam today. -Continue Eliquis 5 mg BID

## 2016-04-26 NOTE — Assessment & Plan Note (Signed)
Patient takes Allopurinol 100 mg BID for prophylaxis and has Colchicine 0.6 mg BID prn for acute flares. He states he has not had a flare in quite some time. Last uric acid from 06/2014 was 10.9.  Gout is stable with no recent flares. -Continue current medications -Repeat Uric acid on follow up with goal <6.0

## 2016-04-26 NOTE — Assessment & Plan Note (Signed)
Patient admits that he is not wearing his CPAP often. He sleeps with his knees on the floor and chest and head on the cough. He says he has the right CPAP equipment but does not feel he has a good grasp on how to properly use it. He will try to follow up with the sleep specialists for clarification. He denies excess daytime somnolence. -Encouraged patient to try to use CPAP and to follow up with sleep specialists

## 2016-04-26 NOTE — Patient Instructions (Signed)
It was a pleasure to see you again Eric Murillo.  Please take your medications as instructed by cardiology to help get some of the fluid off. Take the potassium as well to help prevent this from getting too low.  Please try to get in touch with the sleep facility for help with your CPAP.  We are giving you the flu shot today. You may have some soreness for a couple days and may experience mild cold symptoms. The flu shot will help reduce the risk of you getting the flu this year.  Please follow up with [place] in 3 months or sooner if needed.

## 2016-04-26 NOTE — Assessment & Plan Note (Addendum)
Follows with Cardiology. Seen yesterday and determined to be volume overloaded. He was instructed to increase his Torsemide to 80 mg BID today and take Metolazone 2.5 mg once today. Patient states he has not taken these this morning as he knew he would have to urinate too frequently while in clinic. He states he will take these when he returns home and the second torsemide dose this evening. He will also increase his potassium to help prevent hypokalemia. He does feel like he has increased fluid on board with swelling in his legs and SOB when walking a few steps which is worse than his baseline.  Patient does appear clinically volume overloaded with pitting edema bilateral legs. Lungs are clear. He is up 1.5 lbs compared to yesterday. He is otherwise in no respiratory distress and able to communicate easily in full sentences. Expect improvement in volume status with escalation in diuretic therapy per cardiology.  -Patient to take Torsemide 80 mg BID starting today and Metolazone 2.5 mg once today -Continue Coreg, Amio, Spironolactone -Continue Potassium supplement -He will f/u with Cardiology next week for repeat lab work -Daily weights

## 2016-04-26 NOTE — Telephone Encounter (Signed)
Corliss Blacker Peptide  Order: [id number]  Status:  Final result Visible to patient:  No (Not Released) Dx:  Chronic systolic heart failure (HCC)  Notes Recorded by Chyrl Civatte, RN on 04/26/2016 at 9:39 AM EDT Patient aware. Rx updated to preferred pharmacy electronically. Patient has no needs/questions/concerns at this time.  ------  Notes Recorded by Theresia Bough, CMA on 04/25/2016 at 12:44 PM EDT Left message for patient to call back.  ------  Notes Recorded by Graciella Freer, PA-C on 04/25/2016 at 12:15 PM EDT Increase potassium to 40 meq BID. (Should still take extra 40 with metolazone)   Recheck next week as planned.   Casimiro Needle [id number] [place], PA-C 04/25/2016 12:15 PM

## 2016-04-27 ENCOUNTER — Other Ambulatory Visit: Payer: Self-pay | Admitting: Cardiology

## 2016-04-27 NOTE — Progress Notes (Signed)
Case discussed with Dr. Allena Katz at the time of the visit.  We reviewed the resident's history and exam and pertinent patient test results.  I agree with the assessment, diagnosis and plan of care documented in the resident's note.  If he is unable to get instructions on how to properly use his CPAP from his sleep specialist we will consider contacting the home care agency who is providing his CPAP machine who should be able to provide appropriate re-education on the use of the equipment.

## 2016-05-01 ENCOUNTER — Ambulatory Visit (HOSPITAL_COMMUNITY)
Admission: RE | Admit: 2016-05-01 | Discharge: 2016-05-01 | Disposition: A | Discharge status: Home / Self Care | Payer: Medicaid Other | Source: Ambulatory Visit | Attending: Internal Medicine | Admitting: Internal Medicine

## 2016-05-01 DIAGNOSIS — I5022 Chronic systolic (congestive) heart failure: Secondary | ICD-10-CM | POA: Insufficient documentation

## 2016-05-01 LAB — BASIC METABOLIC PANEL
ANION GAP: 10 (ref 5–15)
BUN: 31 mg/dL — AB (ref 6–20)
CALCIUM: 9.3 mg/dL (ref 8.9–10.3)
CO2: 32 mmol/L (ref 22–32)
CREATININE: 2.54 mg/dL — AB (ref 0.61–1.24)
Chloride: 100 mmol/L — ABNORMAL LOW (ref 101–111)
GFR calc Af Amer: 31 mL/min — ABNORMAL LOW (ref >60)
GFR, EST NON AFRICAN AMERICAN: 27 mL/min — AB (ref >60)
GLUCOSE: 118 mg/dL — AB (ref 65–99)
Potassium: 4 mmol/L (ref 3.5–5.1)
Sodium: 142 mmol/L (ref 135–145)

## 2016-05-05 ENCOUNTER — Encounter: Payer: Self-pay | Admitting: Internal Medicine

## 2016-05-09 ENCOUNTER — Ambulatory Visit (HOSPITAL_COMMUNITY)
Admission: RE | Admit: 2016-05-09 | Discharge: 2016-05-09 | Disposition: A | Discharge status: Home / Self Care | Payer: Medicaid Other | Source: Ambulatory Visit | Attending: Cardiology | Admitting: Cardiology

## 2016-05-09 VITALS — BP 128/90 | HR 85 | Wt 334.4 lb

## 2016-05-09 DIAGNOSIS — G4733 Obstructive sleep apnea (adult) (pediatric): Secondary | ICD-10-CM | POA: Insufficient documentation

## 2016-05-09 DIAGNOSIS — Z7901 Long term (current) use of anticoagulants: Secondary | ICD-10-CM | POA: Diagnosis not present

## 2016-05-09 DIAGNOSIS — E785 Hyperlipidemia, unspecified: Secondary | ICD-10-CM

## 2016-05-09 DIAGNOSIS — Z79899 Other long term (current) drug therapy: Secondary | ICD-10-CM | POA: Diagnosis not present

## 2016-05-09 DIAGNOSIS — Z6841 Body Mass Index (BMI) 40.0 and over, adult: Secondary | ICD-10-CM

## 2016-05-09 DIAGNOSIS — Z8249 Family history of ischemic heart disease and other diseases of the circulatory system: Secondary | ICD-10-CM | POA: Diagnosis not present

## 2016-05-09 DIAGNOSIS — I130 Hypertensive heart and chronic kidney disease with heart failure and stage 1 through stage 4 chronic kidney disease, or unspecified chronic kidney disease: Secondary | ICD-10-CM | POA: Insufficient documentation

## 2016-05-09 DIAGNOSIS — I5022 Chronic systolic (congestive) heart failure: Secondary | ICD-10-CM | POA: Diagnosis not present

## 2016-05-09 DIAGNOSIS — N183 Chronic kidney disease, stage 3 unspecified: Secondary | ICD-10-CM

## 2016-05-09 DIAGNOSIS — I484 Atypical atrial flutter: Secondary | ICD-10-CM

## 2016-05-09 DIAGNOSIS — M109 Gout, unspecified: Secondary | ICD-10-CM | POA: Insufficient documentation

## 2016-05-09 DIAGNOSIS — I428 Other cardiomyopathies: Secondary | ICD-10-CM | POA: Diagnosis not present

## 2016-05-09 LAB — BRAIN NATRIURETIC PEPTIDE: B NATRIURETIC PEPTIDE 5: 295.9 pg/mL — AB (ref 0.0–100.0)

## 2016-05-09 LAB — BASIC METABOLIC PANEL
Anion gap: 9 (ref 5–15)
BUN: 26 mg/dL — ABNORMAL HIGH (ref 6–20)
CALCIUM: 8.9 mg/dL (ref 8.9–10.3)
CO2: 29 mmol/L (ref 22–32)
CREATININE: 2.09 mg/dL — AB (ref 0.61–1.24)
Chloride: 103 mmol/L (ref 101–111)
GFR calc non Af Amer: 34 mL/min — ABNORMAL LOW (ref >60)
GFR, EST AFRICAN AMERICAN: 39 mL/min — AB (ref >60)
Glucose, Bld: 128 mg/dL — ABNORMAL HIGH (ref 65–99)
Potassium: 4.2 mmol/L (ref 3.5–5.1)
SODIUM: 141 mmol/L (ref 135–145)

## 2016-05-09 NOTE — Progress Notes (Signed)
Advanced Heart Failure Medication Review by a Pharmacist  Does the patient  feel that his/her medications are working for him/her?  yes  Has the patient been experiencing any side effects to the medications prescribed?  yes  Does the patient measure his/her own blood pressure or blood glucose at home?  no   Does the patient have any problems obtaining medications due to transportation or finances?   no  Understanding of regimen: good Understanding of indications: good Potential of compliance: good Patient understands to avoid NSAIDs. Patient understands to avoid decongestants.  Issues to address at subsequent visits: None   Pharmacist comments:  Eric Murillo is a pleasant 55 yo M presenting without a medication list but with good recall of his regimen. He reports good compliance with his regimen and did not have any specific medication-related questions or concerns for me at this time.   Eric Murillo. Bonnye Fava, PharmD, BCPS, CPP Clinical Pharmacist Pager: [phone] Phone: [phone] 05/09/2016 11:55 AM      Time with patient: 10 minutes Preparation and documentation time: 2 minutes Total time: 12 minutes

## 2016-05-09 NOTE — Progress Notes (Signed)
Patient ID: Eric KelchJasper Murillo, male   DOB: 04/16/1961, 55 y.o.   MRN: [id number]    Advanced Heart Failure Clinic Note  PCP: Dr. Glenard HaringModing Cardiology: Dr Shirlee LatchMcLean  55 yo with history of morbid obesity, hyperlipidemia, HTN, atrial flutter (s/p TEE DC-CV 04/03/14 and again in 8/17), NICM with chronic systolic HF and severe OSA but unable to tolerate CPAP.    He was admitted in 04/2014 with increased exertional dyspnea and chest tightness.  RHC/LHC showed elevated filling pressures, preserved cardiac output, and no significant coronary disease.  He was diuresed and discharged home. TEE (9/15) with EF 25-30% with global hypokinesis, mild to moderate MR, mildly decreased RV systolic function.   He underwent work-up for cardiac amyloid. SPEP negative. Fat pad biopsy 6/16 with benign adipose tissue. Unable to get cMRI due to size.  Echo in 8/17 showed EF 20-25%, diffuse hypokinesis, PASP 48 mmHg.  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    He was admitted in 8/17 with acute on chronic systolic CHF in setting of atypical atrial flutter.  He was diuresed and had TEE-guided DCCV to NSR.  Amiodarone was begun.   He returns today for regular follow up. At last visit torsemide increased, ans subsequently dropped back down with AKI. Weight down 1 lbs from last visit, but also dressed very warmly (three layers and sweat pants).  Weight at home 330-335. Sleeps kneeling over chronically. Remains mostly sedentary. Trying to watch salt and fluid.  Still drinking > 2 L daily.    Corevue: Thoracic impedence back above threshold.  No VT/VF.    Labs (5/14): K 3.7, creatinine 1.5 Labs (12/14): K 4.2, creatinine 1.27, LDL 84, HDL 31, TSH normal Labs (3/15): K 4.6, creatinine 1.29 Labs (10/07/13) K 3.9 Creatinine 1.44 Labs (11/14/13) K 4.2, creatinine 1.48 Labs (12/05/13) K 4.8, creatinine 2.01  Labs (12/2013): K 4.3, creatinine 1.85, BUN 36, pro-BNP 884 Labs (7/15): K 3.5, creatinine 1.53 Labs (9/15): K 4, creatinine 1.9.   Labs (10/15):  K 4.3, creatinine 1.7 Labs (11/15): K 4.4, creatinine 2.19 Labs (06/29/2014) K 4.0 Creatinine 1.96  Labs (08/21/2014) K 3.9 Creatinine 1.78 Labs (08/04/2015) : K 4.2 Creatinine 1.66 BNP 173 Labs (6/17): K 4.3, creatinine 1.87 Labs (8/17): K 3.9, creatinine 2.27, HCT 41.2  ECG (2/17): NSR, 1st degree AV block, LAFB, poor RWP, low voltage.   Past Medical History: 1. Nonischemic cardiomyopathy: Echo (3/12) with EF 30-35% and mildly dilated LV, diffuse LV hypokinesis, moderate MR, PA systolic pressure 55 mmHg.  Left and right heart cath (3/12): No angiographic CAD; mean RA 20, PA 76/41, mean PCWP 38, CI 2.1.  ANA, SPEP, and HIV negative.  TSH normal.  Denies drug abuse, heavy ETOH intake.  No family history of cardiomyopathy.  Cardiomyopathy may be due to long-standing HTN.  Echo (6/13): EF 35-40%, moderate LV dilation, moderate to severe LAE, PA systolic pressure 52 mmHg.  Unable to fit in magnet for cardiac MRI.  Echo (8/14) with EF 45%, moderately dilated LV, mild LVH, normal RV, PA systolic pressure 49 mmHg. TEE (9/15) with EF 25-30% with global hypokinesis, mild to moderate MR, mildly decreased RV systolic function. LHC/RHC (10/15) with no significant coronary disease; mean RA 14, PA 65/25 mean 42, unable to obtain PCWP but LVEDP 24, CI 2.69.  Echo (6/16) with EF 30-35%, PA systolic pressure 62 mmHg, RV normal size and systolic function. St Jude ICD.  - Echo (8/17): EF 20-25%, mild LV dilation, diffuse hypokinesis, PASP 48 mmHg.  2. ERECTILE  DYSFUNCTION 3. HYPERTENSION  4. CKD: Stage III 5. DYSLIPIDEMIA  6. Obesity 7. Hyperlipidemia 8. Gout 9. OSA: Severe on 6/13 sleep study. Unable to use CPAP.  10. Sialolithiasis 11. Atypical Atrial Flutter: TEE-DC-CV (04/03/14) which was successful; TEE-DCCV in 8/17 was successful.  Now on amiodarone.    Family History: No history of cardiomyopathy No history of cancer among first degree relatives father - died of MI (67s)  mother - HTN  Social  History: Lives girlfriend.  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 tobacco - none alcohol - none drugs - none  ROS: All systems reviewed and negative except as per HPI.   Current Outpatient Prescriptions  Medication Sig Dispense Refill  . allopurinol (ZYLOPRIM) 100 MG tablet TAKE 1 TABLET BY MOUTH TWO TIMES DAILY 60 tablet 2  . amiodarone (PACERONE) 200 MG tablet Take 1 tablet (200 mg total) by mouth daily. 90 tablet 1  . apixaban (ELIQUIS) 5 MG TABS tablet Take 1 tablet (5 mg total) by mouth 2 (two) times daily. 180 tablet 1  . carvedilol (COREG) 25 MG tablet Take 1 tablet (25 mg total) by mouth 2 (two) times daily with a meal. 180 tablet 1  . colchicine 0.6 MG tablet Take 0.6 mg by mouth 2 (two) times daily as needed (gouty flare). Reported on 01/18/2016    . isosorbide-hydrALAZINE (BIDIL) 20-37.5 MG tablet Take 2 tablets by mouth 3 (three) times daily. 540 tablet 1  . metolazone (ZAROXOLYN) 2.5 MG tablet Take 1 tablet (2.5 mg total) by mouth daily as needed. Weight gain 3 lbs overnight/5 lbs in one week. 5 tablet 1  . oxyCODONE-acetaminophen (PERCOCET) 10-325 MG tablet Take 1 tablet by mouth every 6 (six) hours as needed for pain. 90 tablet 0  . potassium chloride SA (KLOR-CON M20) 20 MEQ tablet Take 2 tablets (40 mEq total) by mouth 2 (two) times daily. Take EXTRA 40 meq (2 tabs) on days you take metolazone. 180 tablet 1  . spironolactone (ALDACTONE) 25 MG tablet TAKE 1 TABLET (25 MG TOTAL) BY MOUTH DAILY. 90 tablet 1  . torsemide (DEMADEX) 20 MG tablet Take 60 mg by mouth 2 (two) times daily.     No current facility-administered medications for this encounter.     Vitals:   05/09/16 1133  BP: 128/90  Pulse: 85  SpO2: 96%  Weight: (!) 334 lb 6.4 oz (151.7 kg)   Wt Readings from Last 3 Encounters:  05/09/16 (!) 334 lb 6.4 oz (151.7 kg)  04/26/16 (!) 337 lb 6.4 oz (153 kg)  04/25/16 (!) 335 lb 12.8 oz (152.3 kg)    General: Well appearing, NAD.  Neck:  Thick, JVP  difficult to assess due to body habitus.  No thyromegaly or thyroid nodule. Multiple skin tags present.  Lungs: Slightly decreased basilar sounds.  CV: Nondisplaced PMI.  Heart regular S1/S2, no murmur.  No carotid bruit.   Abdomen: Obese, tight, NT, ND, no HSM. No bruits or masses. +BS  Neurologic: Alert and oriented x 3.  Psych: Normal affect. Extremities: No clubbing or cyanosis. 1+ edema 1/2 to knee.   Assessment/Plan:  1. Chronic systolic heart failure: Nonischemic cardiomyopathy, likely related to HTN.  Echo in 8/14 with EF 45% but EF down to 25-30% on TEE while in atrial flutter (03/2014).  Echo (1/16) with EF ~25% and echo in 6/16 with EF 30-35%.  No cardiac MRI done with elevated creatinine and size.  There was concern for cardiac amyloidosis.  However, negative SPEP and  abdominal fat pad biopsy negative. Low voltage on ECG may be due to obesity and not amyloidosis. Would not proceed with endomyocardial biopsy at this time.  Most recent echo in 8/17 with EF 20-25%.  Was in hospital 02/2016 with CHF exacerbation in setting of atypical atrial flutter.  - Volume status relatively stable. Continues to drink > 2 L. Encouraged again to cut back and watch diet.  - Continue torsemide 60 mg BID. Repeat BMET/BNP today.  - Cant take 2.5 mg metolazone as needed with extra 40 meq of potassium for weight gain of 3 lbs overnight or 5 lbs within one week.  - Continue Coreg 25 mg BID, spironolactone 25 mg daily and Bidil 2 tabs TID.   - Off lisinopril with elevated creatinine.  2. Low voltage on ECG: SPEP and fat pad biopsy negative. See above.  Consensus thus far is this is likely due to obesity rather than amyloidosis. 3. CKD III:  - Repeat BMET today.  4. HTN:  - Stable on current regimen.   5. OSA:  - Encouraged to wear CPAP nightly. 6. Morbid Obesity:  - Needs to lose weight but not very active. Limited by joint pain.  7. Atrial flutter:  Atypical atrial flutter in 9/15 and again in 8/17. s/p  DCCV. CHADSVASC = 2 (HTN, CHF).  - Remains in NSR by corevue. No AF/AT alerts.  - Continue eliquis 5 mg BID. Denies bleeding problems.  - Seen by EP, flutter not felt to be easily amenable to catheter ablation.  - Continue amiodarone to maintain NSR.   - CMET/TSH stable 02/2016. Needs yearly eye exams.  8. Gout:  - Per PCP  Graciella Freer, PA-C 05/09/2016

## 2016-05-09 NOTE — Patient Instructions (Signed)
Routine lab work today. Will notify you of abnormal results, otherwise no news is good news!  No changes in medication today.  Follow up 4 weeks with Otilio Saber, PA-C.  Do the following things EVERYDAY: 1) Weigh yourself in the morning before breakfast. Write it down and keep it in a log. 2) Take your medicines as prescribed 3) Eat low salt foods-Limit salt (sodium) to 2000 mg per day.  4) Stay as active as you can everyday 5) Limit all fluids for the day to less than 2 liters

## 2016-05-16 ENCOUNTER — Telehealth: Payer: Self-pay

## 2016-05-16 NOTE — Telephone Encounter (Signed)
Returned call to patient as he left message that he was interested in [place] HF study. Left message and will attempt to call again later.

## 2016-05-23 ENCOUNTER — Telehealth: Payer: Self-pay

## 2016-05-24 ENCOUNTER — Telehealth: Payer: Self-pay

## 2016-05-24 NOTE — Telephone Encounter (Signed)
Micah Olivos is a 55 y.o. male who was contacted via telephone for monitoring of apixaban (Eliquis) therapy.    ASSESSMENT Indication(s): atrial fibrillation CHA2DS2VASC score 2 Duration: indefinite  Labs:    Component Value Date/Time   AST 35 03/22/2016 1025   ALT 37 03/22/2016 1025   NA 141 05/09/2016 1212   NA 146 (H) 01/12/2016 1035   K 4.2 05/09/2016 1212   CL 103 05/09/2016 1212   CO2 29 05/09/2016 1212   GLUCOSE 128 (H) 05/09/2016 1212   HGBA1C 5.4 07/10/2013 1611   BUN 26 (H) 05/09/2016 1212   BUN 22 01/12/2016 1035   CREATININE 2.09 (H) 05/09/2016 1212   CREATININE 1.44 (H) 05/14/2015 0959   CALCIUM 8.9 05/09/2016 1212   GFRNONAA 34 (L) 05/09/2016 1212   GFRNONAA 65 07/10/2013 1604   GFRAA 39 (L) 05/09/2016 1212   GFRAA 75 07/10/2013 1604   WBC 13.7 (H) 03/10/2016 0314   HGB 12.6 (L) 03/10/2016 0314   HCT 41.2 03/10/2016 0314   PLT 300 03/10/2016 0314    apixaban (Eliquis) Dose: 5 mg BID  Safety: Patient has not had recent bleeding/thromboembolic events. Patient reports no recent signs or symptoms of bleeding, no signs of symptoms of thromboembolism. Medication changes: no.  Renal/hepatic/drug interaction concerns: yes; patient's creatinine appears to fluctuate often and was 2.54mg /dL  on 16/1/09 and most recent value is 2.09 mg/dL recorded on 60/45/40. Recommended to closley monitor renal function.   Adherence: Patient does correctly recite the dose. Patient reports no known adherence challenges. Contacted pharmacy and records indicate refills are not consistent. Refill dates 03/24/16 (30 day supply), 02/22/16 (30 day supply), 12/23/15  (30 day supply).  Patient Instructions: Patient advised to contact clinic or seek medical attention if signs/symptoms of bleeding or thromboembolism occur. Patient verbalized understanding by repeating back information.  Follow-up Recommended labs to consider: CBP, CMP, LFTs . Next appointment 05/30/2016 Cardiology   Lorenso Courier PharmD Candidate  05/24/2016, 2:10 PM

## 2016-05-26 NOTE — Telephone Encounter (Signed)
Patient was contacted with Frank Tillman, PharmD candidate. I agree with the assessment and plan of care documented.  

## 2016-05-26 NOTE — Telephone Encounter (Signed)
Unable to reach.

## 2016-05-30 ENCOUNTER — Ambulatory Visit (INDEPENDENT_AMBULATORY_CARE_PROVIDER_SITE_OTHER): Payer: Medicaid Other

## 2016-05-30 DIAGNOSIS — I5022 Chronic systolic (congestive) heart failure: Secondary | ICD-10-CM

## 2016-05-30 DIAGNOSIS — Z95810 Presence of automatic (implantable) cardiac defibrillator: Secondary | ICD-10-CM

## 2016-05-31 ENCOUNTER — Telehealth: Payer: Self-pay

## 2016-05-31 NOTE — Progress Notes (Addendum)
EPIC Encounter for ICM Monitoring  Patient Name: Eric Murillo is a 55 y.o. male Date: 05/31/2016 Primary Care Physican: Zada Finders, MD Primary Cardiologist:McLean Electrophysiologist: Caryl Comes Dry Weight:    unknown      Attempted ICM call and unable to reach.   Transmission reviewed.   Thoracic impedance normal after 05/22/2016.  Impedance was abnormal suggesting fluid accumulation from 10/17 to 10/30.   Labs: 05/09/2016 Creatinine 2.09, BUN 26, Potassium 4.2, Sodium 141 EGFR 34-39 05/01/2016 Creatinine 2.24, BUN 31, Potassium 4.0, Sodium 142 EGFR 27-31  04/25/2016 Creatinine 1.92, BUN 20, Potassium 3.4, Sodium 142 EGFR 38-44 04/03/2016 Creatinine 2.32, BUN 27, Potassium 4.7, Sodium 144 EGFR 30-35 03/22/2016 Creatinine 2.22, BUN 26, Potassium 4.0, Sodium 142 EGFR 32-37 03/17/2016 Creatinine 2.27, BUN 33, Potassium 3.9, Sodium 141 EGFR 31-36 03/10/2016 Creatinine 2.24, BUN 40, Potassium 4.4, Sodium 138 EGFR 31-36 03/09/2016 Creatinine 2.29, BUN 40, Potassium 3.8, Sodium 139 EGFR 30-35 03/08/2016 Creatinine 2.11, BUN 36, Potassium 3.8, Sodium 141 EGFR 34-39 03/07/2016 Creatinine 2.05, BUN 33, Potassium 4.0, Sodium 140 EGFR 35-40 03/06/2016 Creatinine 2.12, BUN 34, Potassium 3.8, Sodium 138 EGFR 33-39 03/05/2016 Creatinine 2.19, BUN 31, Potassium 4.2, Sodium 140 EGFR 32-37 02/29/2016 Creatinine 1.97, BUN 37, Potassium 3.9, Sodium 140 EGFR 36-42       02/28/2016 Creatinine 2.25, BUN 43, Potassium 3.8, Sodium 138, BNP 327.5  01/18/2016 Creatinine 1.84, BUN 33, Potassium 4.2, Sodium 138 01/12/2016 Creatinine 1.87, BUN 22, Potassium 4.3, Sodium 146 12/29/2015 Creatinine 1.67, BUN 20, Potassium 4.5, Sodium 142 10/22/2015 Creatinine 1.84, BUN 16, Potassium 4.2, Sodium 141 08/04/2015 Creatinine 1.66, BUN 21, Potassium 4.2, Sodium 143  Follow-up plan: ICM clinic phone appointment on 07/03/2016.  Office appointment with HF clinic on 06/06/2016.  Copy of ICM check sent to device physician.     ICM trend: 05/30/2016       Rosalene Billings, RN 05/31/2016 1:27 PM

## 2016-05-31 NOTE — Telephone Encounter (Signed)
Remote ICM transmission received.  Attempted patient call and person answering phone stated he was not home.    

## 2016-06-02 NOTE — Progress Notes (Addendum)
Patient returned call.  He stated he is doing well at this time.  He did have fluid symptoms during time of decreased impedance 10/17 to 10/30.  He has been prescribed Metolazone 2.5 mg prn for weight increase.  Current weight 325 lbs.  No changes today.  Next remote transmission 07/03/2016.

## 2016-06-02 NOTE — Progress Notes (Addendum)
Returned patients call and he was not home

## 2016-06-06 ENCOUNTER — Encounter (HOSPITAL_COMMUNITY): Payer: Self-pay

## 2016-06-06 ENCOUNTER — Ambulatory Visit (HOSPITAL_COMMUNITY)
Admission: RE | Admit: 2016-06-06 | Discharge: 2016-06-06 | Disposition: A | Discharge status: Home / Self Care | Payer: Medicaid Other | Source: Ambulatory Visit | Attending: Cardiology | Admitting: Cardiology

## 2016-06-06 VITALS — BP 128/78 | HR 90 | Wt 336.5 lb

## 2016-06-06 DIAGNOSIS — I429 Cardiomyopathy, unspecified: Secondary | ICD-10-CM | POA: Insufficient documentation

## 2016-06-06 DIAGNOSIS — I5022 Chronic systolic (congestive) heart failure: Secondary | ICD-10-CM | POA: Diagnosis not present

## 2016-06-06 DIAGNOSIS — I484 Atypical atrial flutter: Secondary | ICD-10-CM

## 2016-06-06 DIAGNOSIS — G4733 Obstructive sleep apnea (adult) (pediatric): Secondary | ICD-10-CM

## 2016-06-06 DIAGNOSIS — N183 Chronic kidney disease, stage 3 unspecified: Secondary | ICD-10-CM

## 2016-06-06 DIAGNOSIS — I440 Atrioventricular block, first degree: Secondary | ICD-10-CM | POA: Insufficient documentation

## 2016-06-06 DIAGNOSIS — E785 Hyperlipidemia, unspecified: Secondary | ICD-10-CM

## 2016-06-06 DIAGNOSIS — I130 Hypertensive heart and chronic kidney disease with heart failure and stage 1 through stage 4 chronic kidney disease, or unspecified chronic kidney disease: Secondary | ICD-10-CM | POA: Insufficient documentation

## 2016-06-06 DIAGNOSIS — I444 Left anterior fascicular block: Secondary | ICD-10-CM | POA: Insufficient documentation

## 2016-06-06 DIAGNOSIS — N529 Male erectile dysfunction, unspecified: Secondary | ICD-10-CM | POA: Diagnosis not present

## 2016-06-06 DIAGNOSIS — Z6841 Body Mass Index (BMI) 40.0 and over, adult: Secondary | ICD-10-CM | POA: Diagnosis not present

## 2016-06-06 DIAGNOSIS — Z7901 Long term (current) use of anticoagulants: Secondary | ICD-10-CM

## 2016-06-06 DIAGNOSIS — M109 Gout, unspecified: Secondary | ICD-10-CM | POA: Diagnosis not present

## 2016-06-06 LAB — BASIC METABOLIC PANEL
Anion gap: 8 (ref 5–15)
BUN: 18 mg/dL (ref 6–20)
CALCIUM: 8.8 mg/dL — AB (ref 8.9–10.3)
CHLORIDE: 103 mmol/L (ref 101–111)
CO2: 31 mmol/L (ref 22–32)
CREATININE: 2.25 mg/dL — AB (ref 0.61–1.24)
GFR calc non Af Amer: 31 mL/min — ABNORMAL LOW (ref >60)
GFR, EST AFRICAN AMERICAN: 36 mL/min — AB (ref >60)
Glucose, Bld: 133 mg/dL — ABNORMAL HIGH (ref 65–99)
Potassium: 4 mmol/L (ref 3.5–5.1)
SODIUM: 142 mmol/L (ref 135–145)

## 2016-06-06 NOTE — Patient Instructions (Signed)
Routine lab work today. Will notify you of abnormal results  Please take Metolazone tomorrow Wednesday 06/07/2016.  Follow up with Dr.McLean in 2 months.

## 2016-06-06 NOTE — Progress Notes (Signed)
Advanced Heart Failure Medication Review by a Pharmacist  Does the patient  feel that his/her medications are working for him/her?  yes  Has the patient been experiencing any side effects to the medications prescribed?  no  Does the patient measure his/her own blood pressure or blood glucose at home?  no   Does the patient have any problems obtaining medications due to transportation or finances?   no  Understanding of regimen: good Understanding of indications: good Potential of compliance: good Patient understands to avoid NSAIDs. Patient understands to avoid decongestants.  Issues to address at subsequent visits: NOne   Pharmacist comments:  Eric Murillo is a pleasant 55 yo M presenting without a medication list but with good recall of his regimen including dosages. He reports good compliance with his regimen and did not have any specific medication-related questions or concerns for me at this time.   Tyler Deis. Bonnye Fava, PharmD, BCPS, CPP Clinical Pharmacist Pager: [phone] Phone: [phone] 06/06/2016 9:21 AM      Time with patient: 10 minutes Preparation and documentation time: 2 minutes Total time: 12 minutes

## 2016-06-06 NOTE — Progress Notes (Signed)
Patient ID: Eric Murillo, male   DOB: [birth year], 55 y.o.   MRN: [id number]    Advanced Heart Failure Clinic Note  PCP: Dr. Glenard Haring Cardiology: Dr Shirlee Latch  55 yo with history of morbid obesity, hyperlipidemia, HTN, atrial flutter (s/p TEE DC-CV 04/03/14 and again in 8/17), NICM with chronic systolic HF and severe OSA but unable to tolerate CPAP.    He was admitted in 04/2014 with increased exertional dyspnea and chest tightness.  RHC/LHC showed elevated filling pressures, preserved cardiac output, and no significant coronary disease.  He was diuresed and discharged home. TEE (9/15) with EF 25-30% with global hypokinesis, mild to moderate MR, mildly decreased RV systolic function.   He underwent work-up for cardiac amyloid. SPEP negative. Fat pad biopsy 6/16 with benign adipose tissue. Unable to get cMRI due to size.  Echo in 8/17 showed EF 20-25%, diffuse hypokinesis, PASP 48 mmHg.  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    He was admitted in 8/17 with acute on chronic systolic CHF in setting of atypical atrial flutter.  He was diuresed and had TEE-guided DCCV to NSR.  Amiodarone was begun.   He returns today for regular follow up. Taking torsemide 80 q am and 60 q pm. Peeing well.  Weight stable at home 330-335. Felt great last week, but now weight up a couple founds and feeling more tired. With bendopnea. Sleeps kneeling. Trying to get more active. No SOB with ADLs otherwise. Trying to watch fluid, Drinking 1.5 - 2 L. Avoids salt, but occasionally eats high sodium foods such as tortilla chips.   Corevue: Not functioning this am. (portable unit)  Labs (5/14): K 3.7, creatinine 1.5 Labs (12/14): K 4.2, creatinine 1.27, LDL 84, HDL 31, TSH normal Labs (3/15): K 4.6, creatinine 1.29 Labs (10/07/13) K 3.9 Creatinine 1.44 Labs (11/14/13) K 4.2, creatinine 1.48 Labs (12/05/13) K 4.8, creatinine 2.01  Labs (12/2013): K 4.3, creatinine 1.85, BUN 36, pro-BNP 884 Labs (7/15): K 3.5, creatinine 1.53 Labs (9/15): K 4,  creatinine 1.9.   Labs (10/15): K 4.3, creatinine 1.7 Labs (11/15): K 4.4, creatinine 2.19 Labs (06/29/2014) K 4.0 Creatinine 1.96  Labs (08/21/2014) K 3.9 Creatinine 1.78 Labs (08/04/2015) : K 4.2 Creatinine 1.66 BNP 173 Labs (6/17): K 4.3, creatinine 1.87 Labs (8/17): K 3.9, creatinine 2.27, HCT 41.2  ECG (2/17): NSR, 1st degree AV block, LAFB, poor RWP, low voltage.   Past Medical History: 1. Nonischemic cardiomyopathy: Echo (3/12) with EF 30-35% and mildly dilated LV, diffuse LV hypokinesis, moderate MR, PA systolic pressure 55 mmHg.  Left and right heart cath (3/12): No angiographic CAD; mean RA 20, PA 76/41, mean PCWP 38, CI 2.1.  ANA, SPEP, and HIV negative.  TSH normal.  Denies drug abuse, heavy ETOH intake.  No family history of cardiomyopathy.  Cardiomyopathy may be due to long-standing HTN.  Echo (6/13): EF 35-40%, moderate LV dilation, moderate to severe LAE, PA systolic pressure 52 mmHg.  Unable to fit in magnet for cardiac MRI.  Echo (8/14) with EF 45%, moderately dilated LV, mild LVH, normal RV, PA systolic pressure 49 mmHg. TEE (9/15) with EF 25-30% with global hypokinesis, mild to moderate MR, mildly decreased RV systolic function. LHC/RHC (10/15) with no significant coronary disease; mean RA 14, PA 65/25 mean 42, unable to obtain PCWP but LVEDP 24, CI 2.69.  Echo (6/16) with EF 30-35%, PA systolic pressure 62 mmHg, RV normal size and systolic function. [employer] ICD.  - Echo (8/17): EF 20-25%, mild LV dilation,  diffuse hypokinesis, PASP 48 mmHg.  2. ERECTILE DYSFUNCTION 3. HYPERTENSION  4. CKD: Stage III 5. DYSLIPIDEMIA  6. Obesity 7. Hyperlipidemia 8. Gout 9. OSA: Severe on 6/13 sleep study. Unable to use CPAP.  10. Sialolithiasis 11. Atypical Atrial Flutter: TEE-DC-CV (04/03/14) which was successful; TEE-DCCV in 8/17 was successful.  Now on amiodarone.    Family History: No history of cardiomyopathy No history of cancer among first degree relatives father - died of MI (91s)    mother - HTN  Social History: Lives girlfriend.  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 tobacco - none alcohol - none drugs - none  ROS: All systems reviewed and negative except as per HPI.   Current Outpatient Prescriptions  Medication Sig Dispense Refill  . allopurinol (ZYLOPRIM) 100 MG tablet TAKE 1 TABLET BY MOUTH TWO TIMES DAILY 60 tablet 2  . amiodarone (PACERONE) 200 MG tablet Take 1 tablet (200 mg total) by mouth daily. 90 tablet 1  . apixaban (ELIQUIS) 5 MG TABS tablet Take 1 tablet (5 mg total) by mouth 2 (two) times daily. 180 tablet 1  . carvedilol (COREG) 25 MG tablet Take 1 tablet (25 mg total) by mouth 2 (two) times daily with a meal. 180 tablet 1  . colchicine 0.6 MG tablet Take 0.6 mg by mouth 2 (two) times daily as needed (gouty flare). Reported on 01/18/2016    . isosorbide-hydrALAZINE (BIDIL) 20-37.5 MG tablet Take 2 tablets by mouth 3 (three) times daily. 540 tablet 1  . oxyCODONE-acetaminophen (PERCOCET) 10-325 MG tablet Take 1 tablet by mouth every 6 (six) hours as needed for pain. 90 tablet 0  . potassium chloride SA (KLOR-CON M20) 20 MEQ tablet Take 2 tablets (40 mEq total) by mouth 2 (two) times daily. Take EXTRA 40 meq (2 tabs) on days you take metolazone. 180 tablet 1  . spironolactone (ALDACTONE) 25 MG tablet TAKE 1 TABLET (25 MG TOTAL) BY MOUTH DAILY. 90 tablet 1  . torsemide (DEMADEX) 20 MG tablet Take 60 mg by mouth 2 (two) times daily.    . metolazone (ZAROXOLYN) 2.5 MG tablet Take 1 tablet (2.5 mg total) by mouth daily as needed. Weight gain 3 lbs overnight/5 lbs in one week. (Patient not taking: Reported on 06/06/2016) 5 tablet 1   No current facility-administered medications for this encounter.     Vitals:   06/06/16 0913  BP: 128/78  Pulse: 90  SpO2: 96%  Weight: (!) 336 lb 8 oz (152.6 kg)   Wt Readings from Last 3 Encounters:  06/06/16 (!) 336 lb 8 oz (152.6 kg)  05/09/16 (!) 334 lb 6.4 oz (151.7 kg)  04/26/16 (!) 337 lb 6.4 oz  (153 kg)    General: Well appearing, NAD.  Neck:  Thick, JVP difficult 2/2 body habitus.  Appears ~ 8-9 cm.  No thyromegaly or thyroid nodule. Multiple skin tags present.  Lungs: Slightly decreased basilar sounds.  CV: Nondisplaced PMI.  Heart regular S1/S2, no murmur.  No carotid bruit.   Abdomen: Obese, tight, NT, ND, no HSM. No bruits or masses. +BS  Neurologic: Alert and oriented x 3.  Psych: Normal affect. Extremities: No clubbing or cyanosis. 1-2+ edema 1/2 way to knee.    EKG: Sinus rhythm 79 bmp, 1st degree AV block with occasional PVCs  Assessment/Plan:  1. Chronic systolic heart failure: Nonischemic cardiomyopathy, likely related to HTN.  Echo in 8/14 with EF 45% but EF down to 25-30% on TEE while in atrial flutter (03/2014).  Echo (1/16)  with EF ~25% and echo in 6/16 with EF 30-35%.  No cardiac MRI done with elevated creatinine and size.  There was concern for cardiac amyloidosis.  However, negative SPEP and abdominal fat pad biopsy negative. Low voltage on ECG may be due to obesity and not amyloidosis. Would not proceed with endomyocardial biopsy at this time.  Most recent echo in 8/17 with EF 20-25%.  Was in hospital 02/2016 with CHF exacerbation in setting of atypical atrial flutter.  - Volume status mildly elevated. Trying to watch salt and fluid. NYHA III  - Continue torsemide 80 q am / 60 q pm. Repeat BMET/BNP today.  - Will have take 2.5 mg metolazone tomorrow.  - Can take 2.5 mg metolazone as needed with extra 40 meq of potassium for weight gain of 3 lbs overnight or 5 lbs within one week.  - Continue Coreg 25 mg BID, spironolactone 25 mg daily and Bidil 2 tabs TID.   - Off lisinopril with elevated creatinine.  2. Low voltage on ECG: SPEP and fat pad biopsy negative. See above.  Consensus thus far is this is likely due to obesity rather than amyloidosis. 3. CKD III:  - BMET today.  4. HTN:  - Stable on current regimen.   5. OSA:  - Encouraged to wear CPAP nightly. 6.  Morbid Obesity:  - Needs to lose weight but not very active. Limited by joint pain.  - Encouraged to increase activity as able.  7. Atrial flutter:  Atypical atrial flutter in 9/15 and again in 8/17. s/p DCCV. CHADSVASC = 2 (HTN, CHF).  - Remains in NSR by EKG.   - Continue eliquis 5 mg BID. No bleeding.   - Seen by EP, flutter not felt to be easily amenable to catheter ablation.  - Continue amiodarone to maintain NSR.   - CMET/TSH stable 02/2016. Needs yearly eye exams.  8. Gout:  - Per PCP  Mild overload. Take metolazone tomorrow and encouraged him to take 1 as needed.  He has been hesitant to use.  Follow up 2 months with MD. Pt knows to call sooner with symptoms.   Graciella FreerMichael Andrew Karnisha Lefebre, PA-C 06/06/2016   Total time spent > 25 minutes. Over half that spent discussing the above.

## 2016-07-03 ENCOUNTER — Encounter: Payer: Self-pay | Admitting: Internal Medicine

## 2016-07-03 ENCOUNTER — Ambulatory Visit (INDEPENDENT_AMBULATORY_CARE_PROVIDER_SITE_OTHER): Payer: Medicaid Other

## 2016-07-03 DIAGNOSIS — I5022 Chronic systolic (congestive) heart failure: Secondary | ICD-10-CM

## 2016-07-03 DIAGNOSIS — Z79891 Long term (current) use of opiate analgesic: Secondary | ICD-10-CM | POA: Insufficient documentation

## 2016-07-03 DIAGNOSIS — Z95810 Presence of automatic (implantable) cardiac defibrillator: Secondary | ICD-10-CM | POA: Diagnosis not present

## 2016-07-03 NOTE — Progress Notes (Signed)
EPIC Encounter for ICM Monitoring  Patient Name: Eric Murillo is a 55 y.o. male Date: 07/03/2016 Primary Care Physican: Zada Finders, MD Primary [place] Electrophysiologist: Faustino Congress Weight: 335 lbs        Heart Failure questions reviewed, pt symptomatic with weight gain of several pounds   Thoracic impedance abnormal suggesting fluid accumulation starting 06/27/2016 and almost back at baseline normal today after he took Metolazone on 07/02/2016.  Labs: 05/09/2016 Creatinine 2.09, BUN 26, Potassium 4.2, Sodium 141 EGFR 34-39 05/01/2016 Creatinine 2.24, BUN 31, Potassium 4.0, Sodium 142 EGFR 27-31  04/25/2016 Creatinine 1.92, BUN 20, Potassium 3.4, Sodium 142 EGFR 38-44 04/03/2016 Creatinine 2.32, BUN 27, Potassium 4.7, Sodium 144 EGFR 30-35 03/22/2016 Creatinine 2.22, BUN 26, Potassium 4.0, Sodium 142 EGFR 32-37 03/17/2016 Creatinine 2.27, BUN 33, Potassium 3.9, Sodium 141 EGFR 31-36 03/10/2016 Creatinine 2.24, BUN 40, Potassium 4.4, Sodium 138 EGFR 31-36 03/09/2016 Creatinine 2.29, BUN 40, Potassium 3.8, Sodium 139 EGFR 30-35 03/08/2016 Creatinine 2.11, BUN 36, Potassium 3.8, Sodium 141 EGFR 34-39 03/07/2016 Creatinine 2.05, BUN 33, Potassium 4.0, Sodium 140 EGFR 35-40 03/06/2016 Creatinine 2.12, BUN 34, Potassium 3.8, Sodium 138 EGFR 33-39 03/05/2016 Creatinine 2.19, BUN 31, Potassium 4.2, Sodium 140 EGFR 32-37 02/29/2016 Creatinine 1.97, BUN 37, Potassium 3.9, Sodium 140 EGFR 36-42 02/28/2016 Creatinine 2.25, BUN 43, Potassium 3.8, Sodium 138, BNP 327.5 01/18/2016 Creatinine 1.84, BUN 33, Potassium 4.2, Sodium 138 01/12/2016 Creatinine 1.87, BUN 22, Potassium 4.3, Sodium 146 12/29/2015 Creatinine 1.67, BUN 20, Potassium 4.5, Sodium 142 10/22/2015 Creatinine 1.84, BUN 16, Potassium 4.2, Sodium 141 08/04/2015 Creatinine 1.66, BUN 21, Potassium 4.2, Sodium 143  Recommendations:   Patient took Metolazone for symptoms and no further recommendations today.  Reinforced to limit low salt food choices to 2000 mg day and limiting fluid intake to < 2 liters per day. Encouraged to call for fluid symptoms.    Follow-up plan: ICM clinic phone appointment on 08/03/2016.  Copy of ICM check sent to device physician.   ICM trend: 07/03/2016       Rosalene Billings, RN 07/03/2016 4:02 PM

## 2016-07-26 ENCOUNTER — Other Ambulatory Visit: Payer: Self-pay

## 2016-07-26 DIAGNOSIS — M19079 Primary osteoarthritis, unspecified ankle and foot: Secondary | ICD-10-CM

## 2016-07-26 MED ORDER — OXYCODONE-ACETAMINOPHEN 10-325 MG PO TABS: 1.0000 | ORAL_TABLET | Freq: Four times a day (QID) | ORAL | 0 refills | Status: DC | PRN

## 2016-07-26 NOTE — Telephone Encounter (Signed)
Requesting pain med to be filled.  

## 2016-07-26 NOTE — Telephone Encounter (Signed)
Last visit:04/26/2016 Last UDS: No recent UDS found  Next appointment: 08/03/2016 Last refill: 06/29/2016

## 2016-07-26 NOTE — Telephone Encounter (Signed)
Rx ready - pt called and informed can pick up rx tomorrow.

## 2016-08-02 ENCOUNTER — Encounter: Payer: Self-pay | Admitting: Internal Medicine

## 2016-08-02 ENCOUNTER — Ambulatory Visit (INDEPENDENT_AMBULATORY_CARE_PROVIDER_SITE_OTHER): Payer: Medicaid Other | Admitting: Internal Medicine

## 2016-08-02 VITALS — BP 127/58 | HR 80 | Temp 98.0°F | Ht 69.5 in | Wt 345.1 lb

## 2016-08-02 DIAGNOSIS — M1A30X0 Chronic gout due to renal impairment, unspecified site, without tophus (tophi): Secondary | ICD-10-CM

## 2016-08-02 DIAGNOSIS — N189 Chronic kidney disease, unspecified: Secondary | ICD-10-CM

## 2016-08-02 DIAGNOSIS — M1990 Unspecified osteoarthritis, unspecified site: Secondary | ICD-10-CM | POA: Diagnosis not present

## 2016-08-02 DIAGNOSIS — I484 Atypical atrial flutter: Secondary | ICD-10-CM

## 2016-08-02 DIAGNOSIS — Z79891 Long term (current) use of opiate analgesic: Secondary | ICD-10-CM | POA: Diagnosis not present

## 2016-08-02 DIAGNOSIS — Z79899 Other long term (current) drug therapy: Secondary | ICD-10-CM

## 2016-08-02 DIAGNOSIS — M1000 Idiopathic gout, unspecified site: Secondary | ICD-10-CM

## 2016-08-02 DIAGNOSIS — I4892 Unspecified atrial flutter: Secondary | ICD-10-CM | POA: Diagnosis not present

## 2016-08-02 DIAGNOSIS — Z6841 Body Mass Index (BMI) 40.0 and over, adult: Secondary | ICD-10-CM

## 2016-08-02 DIAGNOSIS — I5022 Chronic systolic (congestive) heart failure: Secondary | ICD-10-CM

## 2016-08-02 DIAGNOSIS — G4733 Obstructive sleep apnea (adult) (pediatric): Secondary | ICD-10-CM | POA: Diagnosis not present

## 2016-08-02 DIAGNOSIS — F1129 Opioid dependence with unspecified opioid-induced disorder: Secondary | ICD-10-CM

## 2016-08-02 DIAGNOSIS — Z0000 Encounter for general adult medical examination without abnormal findings: Secondary | ICD-10-CM

## 2016-08-02 DIAGNOSIS — Z7901 Long term (current) use of anticoagulants: Secondary | ICD-10-CM | POA: Diagnosis not present

## 2016-08-02 NOTE — Progress Notes (Signed)
CC: CHF  HPI:  [name] is a 56 y.o. with PMH as listed below who presents for follow up management of his NICM CHF, Atrial Flutter, HTN, HLD, OSA, Chronic Pain, and Gout  CHF: Follows with Cardiology. Reports progressive shortness of breath over the last few weeks and feels he has increased lower extremity swelling. He has been taking Torsemide 80 mg am and 60 mg pm. He took his morning dose yesterday but only had one pill left and took 20 mg last night. He also has Metolozone 2.5 mg prn which he has used sparingly. He does not check his weights often at home. He does report dietary indiscretion over the holidays with increased fluid and salt intake. At his baseline he is unable to sleep flat on his back and usually sleeps with his knees on the floor and upper body on the cough. His weight this visit is 345 lbs up from 336 lbs two months ago.  Atrial Flutter: Admitted in August 2017 for CHF exacerbation and found to have returned to atrial flutter rhythm, previously sinus after DCCV. He did undergo a second DCCV in August with return to sinus rhythm. Patient denies any current CP or palpitations. He is on Eliquis 5 mg BID indefinitely.  OSA: Patient admits that he is not wearing his CPAP often. He sleeps with his knees on the floor and chest and head on the cough. He says he has the right CPAP equipment but does not feel he has a good grasp on how to properly use it. He has contacted his sleep specialists and is currently working on appropriate use of his CPAP.  Gout: Patient takes Allopurinol 100 mg BID for prophylaxis and has Colchicine 0.6 mg BID prn for acute flares. He states he has not had a flare in quite some time. Last uric acid from 06/2014 was 10.9. Repeat this visit is 7.4.  Chronic Pain/Opioid Dependence: Patient with chronic osteoarthritic pain requiring management with Percocet 10-325 mg q6h #90/month as needed. It does provide relief and allows him to complete his  daily activities. He is unable to tell me how often he is actually needing to use his Percocet and cannot even tell me the last time he used it. He does say that he has not taken his pain medication this morning. Last UDS in June 2016 was negative (patient out of Percocet for a week prior per result note by Dr. Glenard Haring).  Healthcare Maintenance: Patient agreeable for Hepatitis C screening.  Past Medical History:  Diagnosis Date  . AICD (automatic cardioverter/defibrillator) present   . Anemia of chronic disease   . Arthritis   . Atrial flutter (HCC)    DCCV 9/15  . Chest pain on exertion   . Chronic systolic congestive heart failure, NYHA class 2 (HCC)    EF 20-25% 1/16  . CKD (chronic kidney disease) stage 3, GFR 30-59 ml/min   . Dyslipidemia   . Gout    Of big toe  . Hyperglycemia   . Hyperlipidemia   . Hypertension   . Morbid obesity with BMI of 45.0-49.9, adult (HCC)   . Non-ischemic cardiomyopathy (HCC)   . Organic erectile dysfunction   . OSA (obstructive sleep apnea)   . Pilonidal cyst   . Submandibular sialolithiasis    Right    Review of Systems:   Review of Systems  HENT: Negative for nosebleeds.   Respiratory: Positive for shortness of breath. Negative for cough, hemoptysis and wheezing.   Cardiovascular:  Positive for orthopnea and leg swelling. Negative for chest pain and palpitations.  Gastrointestinal: Negative for abdominal pain, blood in stool and melena.  Genitourinary: Negative for hematuria.  Musculoskeletal: Positive for joint pain. Negative for falls.  Neurological: Negative for dizziness and loss of consciousness.     Physical Exam:  Vitals:   08/02/16 1327  BP: (!) 127/58  Pulse: 80  Temp: 98 F (36.7 C)  TempSrc: Oral  SpO2: 97%  Weight: (!) 345 lb 1.6 oz (156.5 kg)  Height: 5' 9.5" (1.765 m)   Physical Exam  Constitutional: He is oriented to person, place, and time. He appears well-developed and well-nourished. No distress.  Sitting  comfortably, speaking in complete sentences without respiratory distress. Large obese body habitus.  Cardiovascular: Normal rate and regular rhythm.   Pulmonary/Chest: Effort normal. No respiratory distress. He has no wheezes. He has no rales.  Abdominal: There is no guarding.  Obese abdomen, nontender to palpation  Musculoskeletal: He exhibits edema. He exhibits no tenderness.  +2-3 pitting edema bilateral lower extremities  Neurological: He is alert and oriented to person, place, and time.  Skin: He is not diaphoretic.    Assessment & Plan:   See Encounters Tab for problem based charting.  Patient discussed with Dr. Heide Spark

## 2016-08-02 NOTE — Patient Instructions (Addendum)
It was a pleasure to see you again Eric Murillo.  It does sound like you have extra fluid on your body. Your weight today is 345 lbs compared to 336 lbs in November.  Please increase your Torsemide to 80 mg in the morning and 80 mg in the evening. Take your Metolazone 2.5 mg once today and as needed as instructed by your heart doctors.  Please limit your salt intake to less than 2 grams per day and your fluid to less than 2 liters per day.  Please check your weights everyday.  We are checking blood work for your kidney numbers and for Hepatitis C.  Please follow up with [place] in 1 week for a recheck.

## 2016-08-03 ENCOUNTER — Telehealth: Payer: Self-pay

## 2016-08-03 ENCOUNTER — Ambulatory Visit (INDEPENDENT_AMBULATORY_CARE_PROVIDER_SITE_OTHER): Payer: Medicaid Other

## 2016-08-03 DIAGNOSIS — Z95810 Presence of automatic (implantable) cardiac defibrillator: Secondary | ICD-10-CM

## 2016-08-03 DIAGNOSIS — I5022 Chronic systolic (congestive) heart failure: Secondary | ICD-10-CM

## 2016-08-03 LAB — BMP8+ANION GAP
ANION GAP: 18 mmol/L (ref 10.0–18.0)
BUN/Creatinine Ratio: 14 (ref 9–20)
BUN: 34 mg/dL — ABNORMAL HIGH (ref 6–24)
CHLORIDE: 99 mmol/L (ref 96–106)
CO2: 26 mmol/L (ref 18–29)
Calcium: 9.1 mg/dL (ref 8.7–10.2)
Creatinine, Ser: 2.5 mg/dL — ABNORMAL HIGH (ref 0.76–1.27)
GFR calc Af Amer: 32 mL/min/{1.73_m2} — ABNORMAL LOW (ref >59)
GFR calc non Af Amer: 28 mL/min/{1.73_m2} — ABNORMAL LOW (ref >59)
GLUCOSE: 115 mg/dL — AB (ref 65–99)
POTASSIUM: 4.7 mmol/L (ref 3.5–5.2)
SODIUM: 143 mmol/L (ref 134–144)

## 2016-08-03 LAB — URIC ACID: Uric Acid: 7.4 mg/dL (ref 3.7–8.6)

## 2016-08-03 LAB — HEPATITIS C ANTIBODY

## 2016-08-03 NOTE — Assessment & Plan Note (Signed)
Patient admits that he is not wearing his CPAP often. He sleeps with his knees on the floor and chest and head on the cough. He says he has the right CPAP equipment but does not feel he has a good grasp on how to properly use it. He has contacted his sleep specialists and is currently working on appropriate use of his CPAP.  Patient to follow up with sleep/CPAP team.

## 2016-08-03 NOTE — Progress Notes (Signed)
EPIC Encounter for ICM Monitoring  Patient Name: Eric Murillo is a 56 y.o. male Date: 08/03/2016 Primary Care Physican: Zada Finders, MD Primary Cardiologist:[place] Electrophysiologist: Caryl Comes Dry Weight:   unknown       Attempted ICM call and unable to reach. Transmission reviewed.   Thoracic impedance just starting to trend below baseline suggesting fluid accumulation.  Labs: 05/09/2016 Creatinine 2.09, BUN 26, Potassium 4.2, Sodium 141 EGFR 34-39 05/01/2016 Creatinine 2.24, BUN 31, Potassium 4.0, Sodium 142 EGFR 27-31  04/25/2016 Creatinine 1.92, BUN 20, Potassium 3.4, Sodium 142 EGFR 38-44 04/03/2016 Creatinine 2.32, BUN 27, Potassium 4.7, Sodium 144 EGFR 30-35 03/22/2016 Creatinine 2.22, BUN 26, Potassium 4.0, Sodium 142 EGFR 32-37 03/17/2016 Creatinine 2.27, BUN 33, Potassium 3.9, Sodium 141 EGFR 31-36 03/10/2016 Creatinine 2.24, BUN 40, Potassium 4.4, Sodium 138 EGFR 31-36 03/09/2016 Creatinine 2.29, BUN 40, Potassium 3.8, Sodium 139 EGFR 30-35 03/08/2016 Creatinine 2.11, BUN 36, Potassium 3.8, Sodium 141 EGFR 34-39 03/07/2016 Creatinine 2.05, BUN 33, Potassium 4.0, Sodium 140 EGFR 35-40 03/06/2016 Creatinine 2.12, BUN 34, Potassium 3.8, Sodium 138 EGFR 33-39 03/05/2016 Creatinine 2.19, BUN 31, Potassium 4.2, Sodium 140 EGFR 32-37 02/29/2016 Creatinine 1.97, BUN 37, Potassium 3.9, Sodium 140 EGFR 36-42 02/28/2016 Creatinine 2.25, BUN 43, Potassium 3.8, Sodium 138, BNP 327.5 01/18/2016 Creatinine 1.84, BUN 33, Potassium 4.2, Sodium 138 01/12/2016 Creatinine 1.87, BUN 22, Potassium 4.3, Sodium 146 12/29/2015 Creatinine 1.67, BUN 20, Potassium 4.5, Sodium 142 10/22/2015 Creatinine 1.84, BUN 16, Potassium 4.2, Sodium 141 08/04/2015 Creatinine 1.66, BUN 21, Potassium 4.2, Sodium 143  Recommendations:  NONE - Unable to reach today    Follow-up plan: ICM clinic phone appointment on 08/11/2016 to recheck fluid levels.  Copy of ICM check sent to primary cardiologist and  device physician.   3 month ICM trend : 08/03/2016   1 Year ICM trend:      Rosalene Billings, RN 08/03/2016 8:10 AM

## 2016-08-03 NOTE — Assessment & Plan Note (Signed)
HCV antibody negative.

## 2016-08-03 NOTE — Assessment & Plan Note (Signed)
Admitted in August 2017 for CHF exacerbation and found to have returned to atrial flutter rhythm, previously sinus after DCCV. He did undergo a second DCCV in August with return to sinus rhythm. Patient denies any current CP or palpitations. He is on Eliquis 5 mg BID indefinitely.  Patient reports adherence to Eliquis and denies any obvious bleeding. Cardiac exam reveals a regular rate and rhythm. -Continue Eliquis 5 mg BID

## 2016-08-03 NOTE — Telephone Encounter (Signed)
Remote ICM transmission received.  Attempted patient call and no answer or answering machine.  

## 2016-08-03 NOTE — Assessment & Plan Note (Addendum)
Patient with chronic osteoarthritic pain requiring management with Percocet 10-325 mg q6h #90/month as needed. It does provide relief and allows him to complete his daily activities. He is unable to tell me how often he is actually needing to use his Percocet and cannot even tell me the last time he used it. He does say that he has not taken his pain medication this morning. Last UDS in June 2016 was negative (patient out of Percocet for a week prior per result note by Dr. Glenard Haring).  Based on patient report, it is apparent that he is not requiring his narcotic pain medication as often as prescribed. [place] Narcotics Database is reviewed and does appear consistent with monthly fills. I discussed this with the patient and we will plan on reducing his monthly amount prescribed on next visit. Will obtain [employer] UDS today. If negative, I will likely decrease his Percocet to #60/month on next refill. Will add Voltaren gel for pain and consider addition of Duloxetine on follow up.

## 2016-08-03 NOTE — Assessment & Plan Note (Addendum)
Follows with Cardiology. Reports progressive shortness of breath over the last few weeks and feels he has increased lower extremity swelling. He has been taking Torsemide 80 mg am and 60 mg pm. He took his morning dose yesterday but only had one pill left and took 20 mg last night. He also has Metolozone 2.5 mg prn which he has used sparingly. He does not check his weights often at home. He does report dietary indiscretion over the holidays with increased fluid and salt intake. At his baseline he is unable to sleep flat on his back and usually sleeps with his knees on the floor and upper body on the cough. His weight this visit is 345 lbs up from 336 lbs two months ago.  Patient does appear clinically volume up with increased dyspnea on exertion, lower extremity edema from baseline, and weight gain in the setting of dietary indiscretion and running out of his torsemide. He otherwise does appear stable for outpatient management of his CHF. Patient does say he has a refill of his Torsemide ready for pickup. He does have CKD and creatinine is slightly increased to 2.50 which may be up from volume but also will need to monitor with planned increase in Torsemide. -Increase Torsemide to 80 mg BID -Take home Metolazone 2.5 mg today and prn as prescribed -Patient advised to limit salt to less than 2 grams and fluids to <2L daily -Check daily weights -f/u early next week for recheck and repeat BMET -patient will follow up with Cardiology as well

## 2016-08-03 NOTE — Assessment & Plan Note (Signed)
Patient takes Allopurinol 100 mg BID for prophylaxis and has Colchicine 0.6 mg BID prn for acute flares. He states he has not had a flare in quite some time. Last uric acid from 06/2014 was 10.9.   Repeat uric acid this visit is 7.4. Generally goal would be <6.0, however as patient is currently well controlled and given his CKD, I will continue his current dosing of Allopurinol at 100 mg BID.

## 2016-08-04 NOTE — Progress Notes (Signed)
Internal Medicine Clinic Attending  Case discussed with Dr. Patel,Vishal at the time of the visit.  We reviewed the resident's history and exam and pertinent patient test results.  I agree with the assessment, diagnosis, and plan of care documented in the resident's note.  

## 2016-08-06 NOTE — Progress Notes (Signed)
Increase torsemide to 80 mg bid x 2 days then back to 60 mg bid.

## 2016-08-07 NOTE — Progress Notes (Signed)
Call to patient and advised to increase torsemide to 80 mg bid x 2 days then back to 60 mg bid per Dr Shirlee Latch.  He verbalized understanding.   Repeat transmission scheduled for 08/11/2016. Eric Murillo

## 2016-08-08 LAB — TOXASSURE SELECT,+ANTIDEPR,UR

## 2016-08-09 ENCOUNTER — Encounter: Payer: Medicaid Other | Admitting: Internal Medicine

## 2016-08-11 ENCOUNTER — Ambulatory Visit (INDEPENDENT_AMBULATORY_CARE_PROVIDER_SITE_OTHER): Payer: Medicaid Other

## 2016-08-11 DIAGNOSIS — I5022 Chronic systolic (congestive) heart failure: Secondary | ICD-10-CM

## 2016-08-11 DIAGNOSIS — Z95810 Presence of automatic (implantable) cardiac defibrillator: Secondary | ICD-10-CM

## 2016-08-11 NOTE — Progress Notes (Signed)
EPIC Encounter for ICM Monitoring  Patient Name: Eric Murillo is a 56 y.o. male Date: 08/11/2016 Primary Care Physican: Zada Finders, MD Primary Cardiologist:[place] Electrophysiologist: Faustino Congress Weight:unknown         Heart Failure questions reviewed, pt stated he feels a little bloated with fluid.   Thoracic impedance returned to normal after increase in Torsemide on 08/03/2016 but trending below baseline again as of 08/09/2016.  He stated he thinks it is abnormal because he has been drinking large amount of fluids.  Labs: 05/09/2016 Creatinine 2.09, BUN 26, Potassium 4.2, Sodium 141 EGFR 34-39 05/01/2016 Creatinine 2.24, BUN 31, Potassium 4.0, Sodium 142 EGFR 27-31  04/25/2016 Creatinine 1.92, BUN 20, Potassium 3.4, Sodium 142 EGFR 38-44 04/03/2016 Creatinine 2.32, BUN 27, Potassium 4.7, Sodium 144 EGFR 30-35 03/22/2016 Creatinine 2.22, BUN 26, Potassium 4.0, Sodium 142 EGFR 32-37 03/17/2016 Creatinine 2.27, BUN 33, Potassium 3.9, Sodium 141 EGFR 31-36 03/10/2016 Creatinine 2.24, BUN 40, Potassium 4.4, Sodium 138 EGFR 31-36 03/09/2016 Creatinine 2.29, BUN 40, Potassium 3.8, Sodium 139 EGFR 30-35 03/08/2016 Creatinine 2.11, BUN 36, Potassium 3.8, Sodium 141 EGFR 34-39 03/07/2016 Creatinine 2.05, BUN 33, Potassium 4.0, Sodium 140 EGFR 35-40 03/06/2016 Creatinine 2.12, BUN 34, Potassium 3.8, Sodium 138 EGFR 33-39 03/05/2016 Creatinine 2.19, BUN 31, Potassium 4.2, Sodium 140 EGFR 32-37 02/29/2016 Creatinine 1.97, BUN 37, Potassium 3.9, Sodium 140 EGFR 36-42 02/28/2016 Creatinine 2.25, BUN 43, Potassium 3.8, Sodium 138, BNP 327.5 01/18/2016 Creatinine 1.84, BUN 33, Potassium 4.2, Sodium 138 01/12/2016 Creatinine 1.87, BUN 22, Potassium 4.3, Sodium 146 12/29/2015 Creatinine 1.67, BUN 20, Potassium 4.5, Sodium 142 10/22/2015 Creatinine 1.84, BUN 16, Potassium 4.2, Sodium 141 08/04/2015 Creatinine 1.66, BUN 21, Potassium 4.2, Sodium 143  Recommendations:  Advised to follow the  prescribed instructions for Metolazone and Potassium if needed for fluid symptoms.   Advised to limit fluid intake to < 2 liters daily.  Follow-up plan: ICM clinic phone appointment on 08/23/2016.  Copy of ICM check sent to primary cardiologist and device physician.   3 month ICM trend: 08/11/2016   1 Year ICM trend:      Rosalene Billings, RN 08/11/2016 5:17 PM

## 2016-08-16 ENCOUNTER — Encounter: Payer: Self-pay | Admitting: Internal Medicine

## 2016-08-16 ENCOUNTER — Ambulatory Visit (INDEPENDENT_AMBULATORY_CARE_PROVIDER_SITE_OTHER): Payer: Medicaid Other | Admitting: Internal Medicine

## 2016-08-16 VITALS — BP 109/80 | HR 96 | Temp 97.5°F | Ht 69.5 in | Wt 337.7 lb

## 2016-08-16 DIAGNOSIS — Z79899 Other long term (current) drug therapy: Secondary | ICD-10-CM

## 2016-08-16 DIAGNOSIS — I5022 Chronic systolic (congestive) heart failure: Secondary | ICD-10-CM

## 2016-08-16 NOTE — Patient Instructions (Addendum)
It was a pleasure to see you again Eric Murillo.  I am glad you are feeling better.  Your weight today is 337 lbs compared to 345 lbs two weeks ago.  Please check your weight at home every morning.  If you notice your weight go up 3 lbs in one night or 5 lbs in a week, take your metolazone and give [place] a call.  Try to continue limiting your salt and fluids.  We will recheck your blood work today.  Please follow up with me in about 3 months or sooner if needed.

## 2016-08-16 NOTE — Progress Notes (Signed)
   CC: CHF  HPI:  [name] is a 56 y.o. male with PMH as listed below who presents for follow up management of his CHF. Please see problem based charting for status of patient's chronic medical conditions.  CHF: Patient returns for 2 week follow up of his CHF. On last visit, he presented with increased DOE, edema, and weight. He was advised to increase his Torsemide to 80 mg BID and take his Metolazone 2.5 mg once. He did find relief with improvement in his dyspnea. His weight has come down from 345 lbs to 337 lbs this visit. He does have continued pedal edema, although he feels this is improved from prior. He has decreased his Torsemide to 80 mg am and 60 mg pm (prescribed 60 mg BID) and has not taken his metolazone again. He intermittently checks his weight at home and sees his weight around 335 lbs. He is trying to limit salt in his diet and fluids to less than 1.5 L daily. BMET last visit showed a creatinine of 2.50.  Past Medical History:  Diagnosis Date  . AICD (automatic cardioverter/defibrillator) present   . Anemia of chronic disease   . Arthritis   . Atrial flutter (HCC)    DCCV 9/15  . Chest pain on exertion   . Chronic systolic congestive heart failure, NYHA class 2 (HCC)    EF 20-25% 1/16  . CKD (chronic kidney disease) stage 3, GFR 30-59 ml/min   . Dyslipidemia   . Gout    Of big toe  . Hyperglycemia   . Hyperlipidemia   . Hypertension   . Morbid obesity with BMI of 45.0-49.9, adult (HCC)   . Non-ischemic cardiomyopathy (HCC)   . Organic erectile dysfunction   . OSA (obstructive sleep apnea)   . Pilonidal cyst   . Submandibular sialolithiasis    Right    Review of Systems:   Review of Systems  Constitutional: Positive for weight loss. Negative for chills and fever.  Respiratory:       Slight shortness of breath, improved from prior.  Cardiovascular: Positive for leg swelling. Negative for chest pain and palpitations.  Genitourinary: Negative for  dysuria.     Physical Exam:  Vitals:   08/16/16 1551  BP: 109/80  Pulse: 96  Temp: 97.5 F (36.4 C)  TempSrc: Oral  SpO2: 100%  Weight: (!) 337 lb 11.2 oz (153.2 kg)  Height: 5' 9.5" (1.765 m)   Physical Exam  Constitutional: He is oriented to person, place, and time. He appears well-developed and well-nourished. No distress.  Obese man  Cardiovascular: Normal rate and regular rhythm.   No murmur heard. Pulmonary/Chest: Effort normal. No respiratory distress. He has no wheezes. He has no rales.  Musculoskeletal: He exhibits no tenderness.  +2 pitting edema b/l LEs.  Neurological: He is alert and oriented to person, place, and time.  Skin: He is not diaphoretic.    Assessment & Plan:   See Encounters Tab for problem based charting.  Patient discussed with Dr. Cyndie Chime

## 2016-08-17 LAB — BMP8+ANION GAP
Anion Gap: 20 mmol/L — ABNORMAL HIGH (ref 10.0–18.0)
BUN / CREAT RATIO: 14 (ref 9–20)
BUN: 40 mg/dL — ABNORMAL HIGH (ref 6–24)
CALCIUM: 9.4 mg/dL (ref 8.7–10.2)
CHLORIDE: 98 mmol/L (ref 96–106)
CO2: 29 mmol/L (ref 18–29)
Creatinine, Ser: 2.81 mg/dL — ABNORMAL HIGH (ref 0.76–1.27)
GFR, EST AFRICAN AMERICAN: 28 mL/min/{1.73_m2} — AB (ref >59)
GFR, EST NON AFRICAN AMERICAN: 24 mL/min/{1.73_m2} — AB (ref >59)
GLUCOSE: 100 mg/dL — AB (ref 65–99)
POTASSIUM: 3.9 mmol/L (ref 3.5–5.2)
Sodium: 147 mmol/L — ABNORMAL HIGH (ref 134–144)

## 2016-08-17 NOTE — Assessment & Plan Note (Signed)
Patient returns for 2 week follow up of his CHF. On last visit, he presented with increased DOE, edema, and weight. He was advised to increase his Torsemide to 80 mg BID and take his Metolazone 2.5 mg once. He did find relief with improvement in his dyspnea. His weight has come down from 345 lbs to 337 lbs this visit. He does have continued pedal edema, although he feels this is improved from prior. He has decreased his Torsemide to 80 mg am and 60 mg pm (prescribed 60 mg BID) and has not taken his metolazone again. He intermittently checks his weight at home and sees his weight around 335 lbs. He is trying to limit salt in his diet and fluids to less than 1.5 L daily. BMET last visit showed a creatinine of 2.50.  Patient feels much better this visit. Repeat BMET shows creatinine rising to 2.8. I have discussed the results with patient and advised him to decrease his Torsemide to 60 mg BID and use Metolazone 2.5 mg prn (weight gain of 3 lbs/day or 5 lbs/week). He is also advised to continue daily weights and limit salt to < 2 grams and fluids to < 2 L daily.

## 2016-08-17 NOTE — Progress Notes (Signed)
Medicine attending: Medical history, presenting problems, physical findings, and medications, reviewed with resident physician Dr Vishal Patel on the day of the patient visit and I concur with his evaluation and management plan. 

## 2016-08-23 ENCOUNTER — Ambulatory Visit (INDEPENDENT_AMBULATORY_CARE_PROVIDER_SITE_OTHER): Payer: Medicaid Other

## 2016-08-23 DIAGNOSIS — I5022 Chronic systolic (congestive) heart failure: Secondary | ICD-10-CM

## 2016-08-23 DIAGNOSIS — Z95810 Presence of automatic (implantable) cardiac defibrillator: Secondary | ICD-10-CM

## 2016-08-24 ENCOUNTER — Other Ambulatory Visit: Payer: Self-pay | Admitting: Internal Medicine

## 2016-08-24 NOTE — Progress Notes (Signed)
EPIC Encounter for ICM Monitoring  Patient Name: Eric Murillo is a 56 y.o. male Date: 08/24/2016 Primary Care Physican: Zada Finders, MD Primary Cardiologist:[place] Electrophysiologist: Faustino Congress Weight:unknown          Heart Failure questions reviewed, pt asymptomatic and stated he is feeling good.    Thoracic impedance normal   Labs: 05/09/2016 Creatinine 2.09, BUN 26, Potassium 4.2, Sodium 141 EGFR 34-39 05/01/2016 Creatinine 2.24, BUN 31, Potassium 4.0, Sodium 142 EGFR 27-31  04/25/2016 Creatinine 1.92, BUN 20, Potassium 3.4, Sodium 142 EGFR 38-44 04/03/2016 Creatinine 2.32, BUN 27, Potassium 4.7, Sodium 144 EGFR 30-35 03/22/2016 Creatinine 2.22, BUN 26, Potassium 4.0, Sodium 142 EGFR 32-37 03/17/2016 Creatinine 2.27, BUN 33, Potassium 3.9, Sodium 141 EGFR 31-36 03/10/2016 Creatinine 2.24, BUN 40, Potassium 4.4, Sodium 138 EGFR 31-36 03/09/2016 Creatinine 2.29, BUN 40, Potassium 3.8, Sodium 139 EGFR 30-35 03/08/2016 Creatinine 2.11, BUN 36, Potassium 3.8, Sodium 141 EGFR 34-39 03/07/2016 Creatinine 2.05, BUN 33, Potassium 4.0, Sodium 140 EGFR 35-40 03/06/2016 Creatinine 2.12, BUN 34, Potassium 3.8, Sodium 138 EGFR 33-39 03/05/2016 Creatinine 2.19, BUN 31, Potassium 4.2, Sodium 140 EGFR 32-37 02/29/2016 Creatinine 1.97, BUN 37, Potassium 3.9, Sodium 140 EGFR 36-42 02/28/2016 Creatinine 2.25, BUN 43, Potassium 3.8, Sodium 138, BNP 327.5 01/18/2016 Creatinine 1.84, BUN 33, Potassium 4.2, Sodium 138 01/12/2016 Creatinine 1.87, BUN 22, Potassium 4.3, Sodium 146 12/29/2015 Creatinine 1.67, BUN 20, Potassium 4.5, Sodium 142 10/22/2015 Creatinine 1.84, BUN 16, Potassium 4.2, Sodium 141 08/04/2015 Creatinine 1.66, BUN 21, Potassium 4.2, Sodium 143  Recommendations: No changes. Reminded to limit dietary salt intake to 2000 mg/day and fluid intake to < 2 liters/day. Encouraged to call for fluid symptoms.  Follow-up plan: ICM clinic phone appointment on 10/12/2016 and HF clinic  appointment on 09/11/2016.  Copy of ICM check sent to primary cardiologist and device physician.   3 month ICM trend: 08/23/2016   1 Year ICM trend:      Rosalene Billings, RN 08/24/2016 3:00 PM

## 2016-09-11 ENCOUNTER — Encounter (HOSPITAL_COMMUNITY): Payer: Self-pay

## 2016-09-11 ENCOUNTER — Ambulatory Visit (HOSPITAL_COMMUNITY)
Admission: RE | Admit: 2016-09-11 | Discharge: 2016-09-11 | Disposition: A | Discharge status: Home / Self Care | Payer: Medicaid Other | Source: Ambulatory Visit | Attending: Cardiology | Admitting: Cardiology

## 2016-09-11 VITALS — BP 140/90 | HR 89 | Wt 330.4 lb

## 2016-09-11 DIAGNOSIS — I130 Hypertensive heart and chronic kidney disease with heart failure and stage 1 through stage 4 chronic kidney disease, or unspecified chronic kidney disease: Secondary | ICD-10-CM | POA: Insufficient documentation

## 2016-09-11 DIAGNOSIS — M109 Gout, unspecified: Secondary | ICD-10-CM | POA: Diagnosis not present

## 2016-09-11 DIAGNOSIS — N183 Chronic kidney disease, stage 3 unspecified: Secondary | ICD-10-CM

## 2016-09-11 DIAGNOSIS — Z7901 Long term (current) use of anticoagulants: Secondary | ICD-10-CM | POA: Insufficient documentation

## 2016-09-11 DIAGNOSIS — G4733 Obstructive sleep apnea (adult) (pediatric): Secondary | ICD-10-CM | POA: Insufficient documentation

## 2016-09-11 DIAGNOSIS — I484 Atypical atrial flutter: Secondary | ICD-10-CM

## 2016-09-11 DIAGNOSIS — I429 Cardiomyopathy, unspecified: Secondary | ICD-10-CM | POA: Insufficient documentation

## 2016-09-11 DIAGNOSIS — E785 Hyperlipidemia, unspecified: Secondary | ICD-10-CM | POA: Insufficient documentation

## 2016-09-11 DIAGNOSIS — N529 Male erectile dysfunction, unspecified: Secondary | ICD-10-CM | POA: Insufficient documentation

## 2016-09-11 DIAGNOSIS — I5022 Chronic systolic (congestive) heart failure: Secondary | ICD-10-CM | POA: Diagnosis not present

## 2016-09-11 LAB — COMPREHENSIVE METABOLIC PANEL
ALT: 44 U/L (ref 17–63)
AST: 50 U/L — AB (ref 15–41)
Albumin: 4 g/dL (ref 3.5–5.0)
Alkaline Phosphatase: 136 U/L — ABNORMAL HIGH (ref 38–126)
Anion gap: 12 (ref 5–15)
BUN: 29 mg/dL — AB (ref 6–20)
CO2: 34 mmol/L — AB (ref 22–32)
CREATININE: 2.78 mg/dL — AB (ref 0.61–1.24)
Calcium: 9.5 mg/dL (ref 8.9–10.3)
Chloride: 97 mmol/L — ABNORMAL LOW (ref 101–111)
GFR calc non Af Amer: 24 mL/min — ABNORMAL LOW (ref >60)
GFR, EST AFRICAN AMERICAN: 28 mL/min — AB (ref >60)
Glucose, Bld: 108 mg/dL — ABNORMAL HIGH (ref 65–99)
POTASSIUM: 4.4 mmol/L (ref 3.5–5.1)
SODIUM: 143 mmol/L (ref 135–145)
Total Bilirubin: 1.6 mg/dL — ABNORMAL HIGH (ref 0.3–1.2)
Total Protein: 7.2 g/dL (ref 6.5–8.1)

## 2016-09-11 LAB — TSH: TSH: 1.419 u[IU]/mL (ref 0.350–4.500)

## 2016-09-11 LAB — CBC
HEMATOCRIT: 41 % (ref 39.0–52.0)
HEMOGLOBIN: 12.6 g/dL — AB (ref 13.0–17.0)
MCH: 28.4 pg (ref 26.0–34.0)
MCHC: 30.7 g/dL (ref 30.0–36.0)
MCV: 92.6 fL (ref 78.0–100.0)
Platelets: 177 10*3/uL (ref 150–400)
RBC: 4.43 MIL/uL (ref 4.22–5.81)
RDW: 14.6 % (ref 11.5–15.5)
WBC: 11.4 10*3/uL — ABNORMAL HIGH (ref 4.0–10.5)

## 2016-09-11 LAB — BRAIN NATRIURETIC PEPTIDE: B NATRIURETIC PEPTIDE 5: 343.2 pg/mL — AB (ref 0.0–100.0)

## 2016-09-11 MED ORDER — TORSEMIDE 20 MG PO TABS: ORAL_TABLET | ORAL | 3 refills | Status: DC
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
## 2016-09-11 NOTE — Patient Instructions (Signed)
Increase Torsemide to 80 mg (4 tabs) in AM and 60 mg (3 tabs) in PM  Labs today  Labs in 2 weeks  Your physician recommends that you schedule a follow-up appointment in: 3 weeks

## 2016-09-15 ENCOUNTER — Encounter: Payer: Self-pay | Admitting: Cardiology

## 2016-09-25 ENCOUNTER — Ambulatory Visit (HOSPITAL_COMMUNITY)
Admission: RE | Admit: 2016-09-25 | Discharge: 2016-09-25 | Disposition: A | Discharge status: Home / Self Care | Payer: Medicaid Other | Source: Ambulatory Visit | Attending: Cardiology | Admitting: Cardiology

## 2016-09-25 DIAGNOSIS — I5022 Chronic systolic (congestive) heart failure: Secondary | ICD-10-CM | POA: Insufficient documentation

## 2016-09-25 LAB — BASIC METABOLIC PANEL
Anion gap: 9 (ref 5–15)
BUN: 36 mg/dL — AB (ref 6–20)
CALCIUM: 9.4 mg/dL (ref 8.9–10.3)
CHLORIDE: 101 mmol/L (ref 101–111)
CO2: 31 mmol/L (ref 22–32)
CREATININE: 2.78 mg/dL — AB (ref 0.61–1.24)
GFR calc non Af Amer: 24 mL/min — ABNORMAL LOW (ref >60)
GFR, EST AFRICAN AMERICAN: 28 mL/min — AB (ref >60)
GLUCOSE: 120 mg/dL — AB (ref 65–99)
Potassium: 3.9 mmol/L (ref 3.5–5.1)
Sodium: 141 mmol/L (ref 135–145)

## 2016-09-27 ENCOUNTER — Encounter: Payer: Self-pay | Admitting: Internal Medicine

## 2016-09-28 ENCOUNTER — Encounter: Payer: Self-pay | Admitting: Physician Assistant

## 2016-09-29 NOTE — Progress Notes (Signed)
ICM remote transmission rescheduled from 10/12/2016 to 10/16/2016.  

## 2016-10-02 ENCOUNTER — Encounter (HOSPITAL_COMMUNITY): Payer: Medicaid Other
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
## 2016-10-05 ENCOUNTER — Ambulatory Visit (HOSPITAL_COMMUNITY)
Admission: RE | Admit: 2016-10-05 | Discharge: 2016-10-05 | Disposition: A | Discharge status: Home / Self Care | Payer: Medicaid Other | Source: Ambulatory Visit | Attending: Cardiology | Admitting: Cardiology

## 2016-10-05 ENCOUNTER — Encounter: Payer: Self-pay | Admitting: Physician Assistant

## 2016-10-05 ENCOUNTER — Ambulatory Visit (INDEPENDENT_AMBULATORY_CARE_PROVIDER_SITE_OTHER): Payer: Medicaid Other | Admitting: Physician Assistant

## 2016-10-05 VITALS — BP 142/88 | HR 96 | Wt 325.6 lb

## 2016-10-05 VITALS — BP 124/82 | HR 88 | Ht 69.5 in | Wt 328.0 lb

## 2016-10-05 DIAGNOSIS — I5022 Chronic systolic (congestive) heart failure: Secondary | ICD-10-CM | POA: Insufficient documentation

## 2016-10-05 DIAGNOSIS — I428 Other cardiomyopathies: Secondary | ICD-10-CM | POA: Diagnosis not present

## 2016-10-05 DIAGNOSIS — Z95810 Presence of automatic (implantable) cardiac defibrillator: Secondary | ICD-10-CM

## 2016-10-05 DIAGNOSIS — I429 Cardiomyopathy, unspecified: Secondary | ICD-10-CM | POA: Diagnosis not present

## 2016-10-05 DIAGNOSIS — N183 Chronic kidney disease, stage 3 unspecified: Secondary | ICD-10-CM

## 2016-10-05 DIAGNOSIS — I484 Atypical atrial flutter: Secondary | ICD-10-CM | POA: Diagnosis not present

## 2016-10-05 DIAGNOSIS — Z9889 Other specified postprocedural states: Secondary | ICD-10-CM | POA: Insufficient documentation

## 2016-10-05 DIAGNOSIS — N529 Male erectile dysfunction, unspecified: Secondary | ICD-10-CM | POA: Diagnosis not present

## 2016-10-05 DIAGNOSIS — E785 Hyperlipidemia, unspecified: Secondary | ICD-10-CM | POA: Insufficient documentation

## 2016-10-05 DIAGNOSIS — I130 Hypertensive heart and chronic kidney disease with heart failure and stage 1 through stage 4 chronic kidney disease, or unspecified chronic kidney disease: Secondary | ICD-10-CM | POA: Diagnosis present

## 2016-10-05 DIAGNOSIS — G4733 Obstructive sleep apnea (adult) (pediatric): Secondary | ICD-10-CM | POA: Diagnosis not present

## 2016-10-05 DIAGNOSIS — M109 Gout, unspecified: Secondary | ICD-10-CM | POA: Diagnosis not present

## 2016-10-05 DIAGNOSIS — Z7901 Long term (current) use of anticoagulants: Secondary | ICD-10-CM | POA: Diagnosis not present

## 2016-10-05 LAB — BASIC METABOLIC PANEL
Anion gap: 10 (ref 5–15)
BUN: 29 mg/dL — AB (ref 6–20)
CALCIUM: 9 mg/dL (ref 8.9–10.3)
CO2: 33 mmol/L — AB (ref 22–32)
Chloride: 98 mmol/L — ABNORMAL LOW (ref 101–111)
Creatinine, Ser: 2.42 mg/dL — ABNORMAL HIGH (ref 0.61–1.24)
GFR calc Af Amer: 33 mL/min — ABNORMAL LOW (ref >60)
GFR calc non Af Amer: 28 mL/min — ABNORMAL LOW (ref >60)
GLUCOSE: 94 mg/dL (ref 65–99)
Potassium: 3.8 mmol/L (ref 3.5–5.1)
Sodium: 141 mmol/L (ref 135–145)

## 2016-10-05 LAB — BRAIN NATRIURETIC PEPTIDE: B Natriuretic Peptide: 322.3 pg/mL — ABNORMAL HIGH (ref 0.0–100.0)

## 2016-10-05 NOTE — Patient Instructions (Addendum)
Medication Instructions:   Your physician recommends that you continue on your current medications as directed. Please refer to the Current Medication list given to you today.   If you need a refill on your cardiac medications before your next appointment, please call your pharmacy.  Labwork: NONE ORDERED  TODAY    Testing/Procedures: NONE ORDERED  TODAY    Follow-Up:  Your physician wants you to follow-up in: ONE YEAR WITH  Eric Murillo  You will receive a reminder letter in the mail two months in advance. If you don't receive a letter, please call our office to schedule the follow-up appointment.    Remote monitoring is used to monitor your Pacemaker of ICD from home. This monitoring reduces the number of office visits required to check your device to one time per year. It allows [place] to keep an eye on the functioning of your device to ensure it is working properly. You are scheduled for a device check from home on . 01-04-2017 .Marland KitchenYou may send your transmission at any time that day. If you have a wireless device, the transmission will be sent automatically. After your physician reviews your transmission, you will receive a postcard with your next transmission date.      Any Other Special Instructions Will Be Listed Below (If Applicable).

## 2016-10-05 NOTE — Progress Notes (Signed)
Advanced Heart Failure Medication Review by a Pharmacist  Does the patient  feel that his/her medications are working for him/her?  yes  Has the patient been experiencing any side effects to the medications prescribed?  no  Does the patient measure his/her own blood pressure or blood glucose at home?  yes   Does the patient have any problems obtaining medications due to transportation or finances?   no  Understanding of regimen: good Understanding of indications: good Potential of compliance: good Patient understands to avoid NSAIDs. Patient understands to avoid decongestants.  Issues to address at subsequent visits: None  Pharmacist comments: Eric Murillo is a pleasant 56 yo AAM presenting to the advanced HF clinic. He endorses good compliance with his medications, only missing occasionally when waiting for refills from pharmacy. He had no questions or concerns about his medications.   Allie Bossier, PharmD PGY1 Pharmacy Resident [phone] (Pager) 10/05/2016 2:57 PM  Time with patient: 10 min Preparation and documentation time: 2 min  Total time: 12 min

## 2016-10-05 NOTE — Patient Instructions (Signed)
Labs today We will only contact you if something comes back abnormal or we need to make some changes. Otherwise no news is good news!  You have been referred to [facility], they will contact you with a appointment     Be sure to take Metolazone 2.5 mg tomorrow  Your physician recommends that you schedule a follow-up appointment in: 3 weeks with Eric Clegg,NP  Do the following things EVERYDAY: 1) Weigh yourself in the morning before breakfast. Write it down and keep it in a log. 2) Take your medicines as prescribed 3) Eat low salt foods-Limit salt (sodium) to 2000 mg per day.  4) Stay as active as you can everyday 5) Limit all fluids for the day to less than 2 liters
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
## 2016-10-16 ENCOUNTER — Ambulatory Visit (INDEPENDENT_AMBULATORY_CARE_PROVIDER_SITE_OTHER): Payer: Medicaid Other

## 2016-10-16 DIAGNOSIS — I5022 Chronic systolic (congestive) heart failure: Secondary | ICD-10-CM

## 2016-10-16 DIAGNOSIS — Z95810 Presence of automatic (implantable) cardiac defibrillator: Secondary | ICD-10-CM

## 2016-10-16 NOTE — Progress Notes (Signed)
EPIC Encounter for ICM Monitoring  Patient Name: Eric Murillo is a 56 y.o. male Date: 10/16/2016 Primary Care Physican: Zada Finders, MD Primary Cardiologist:[place] Electrophysiologist: Faustino Congress Weight:unknown        Heart Failure questions reviewed, pt asymptomatic .   Thoracic impedance normal on 10/16/2016 but was abnormal suggesting fluid accumulation from 10/11/2016 to 10/15/2016.  Prescribed dosage: Torsemide 20 mg 4 tablets (80 mg total) in am and 3 tablets (60 mg total) in pm.  Metolazone 2.5 mg 1 tablet (2.5 mg total) by mouth daily as needed for  weight gain 3 lbs overnight/5 lbs in one week.   Potassium 20 mEq 2 tablets (40 mEq total) by mouth 2 (two) times daily. Take EXTRA 40 meq (2 tabs) on days you take metolazone  Labs: 10/05/2016 Creatinine 2.42, BUN 29, Potassium 3.8, Sodium 141, EGFR 28-33 09/25/2016 Creatinine 2.78, BUN 36, Potassium 3.9, Sodium 141, EGFR 24-28  09/11/2016 Creatinine 2.78, BUN 29, Potassium 4.4, Sodium 143, EGFR 24-28  08/16/2016 Creatinine 2.81, BUN 40, Potassium 3.9, Sodium 147, EGFR 24-28  08/02/2016 Creatinine 2.50, BUN 34, Potassium 4.7, Sodium 143, EGFR 28-32  06/06/2016 Creatinine 2.25, BUN 18, Potassium 4.0, Sodium 142, EGFR 31-36  05/09/2016 Creatinine 2.09, BUN 26, Potassium 4.2, Sodium 141 EGFR 34-39 05/01/2016 Creatinine 2.24, BUN 31, Potassium 4.0, Sodium 142 EGFR 27-31  04/25/2016 Creatinine 1.92, BUN 20, Potassium 3.4, Sodium 142 EGFR 38-44 04/03/2016 Creatinine 2.32, BUN 27, Potassium 4.7, Sodium 144 EGFR 30-35 03/22/2016 Creatinine 2.22, BUN 26, Potassium 4.0, Sodium 142 EGFR 32-37 03/17/2016 Creatinine 2.27, BUN 33, Potassium 3.9, Sodium 141 EGFR 31-36 03/10/2016 Creatinine 2.24, BUN 40, Potassium 4.4, Sodium 138 EGFR 31-36 03/09/2016 Creatinine 2.29, BUN 40, Potassium 3.8, Sodium 139 EGFR 30-35 03/08/2016 Creatinine 2.11, BUN 36, Potassium 3.8, Sodium 141 EGFR 34-39 03/07/2016 Creatinine 2.05, BUN 33, Potassium 4.0, Sodium 140  EGFR 35-40 03/06/2016 Creatinine 2.12, BUN 34, Potassium 3.8, Sodium 138 EGFR 33-39 03/05/2016 Creatinine 2.19, BUN 31, Potassium 4.2, Sodium 140 EGFR 32-37 02/29/2016 Creatinine 1.97, BUN 37, Potassium 3.9, Sodium 140 EGFR 36-42 02/28/2016 Creatinine 2.25, BUN 43, Potassium 3.8, Sodium 138, BNP 327.5 01/18/2016 Creatinine 1.84, BUN 33, Potassium 4.2, Sodium 138 01/12/2016 Creatinine 1.87, BUN 22, Potassium 4.3, Sodium 146 12/29/2015 Creatinine 1.67, BUN 20, Potassium 4.5, Sodium 142 10/22/2015 Creatinine 1.84, BUN 16, Potassium 4.2, Sodium 141 08/04/2015 Creatinine 1.66, BUN 21, Potassium 4.2, Sodium 143  Recommendations: No changes. Reminded to limit dietary salt intake to 2000 mg/day and fluid intake to < 2 liters/day. Encouraged to call for fluid symptoms.  Follow-up plan: ICM clinic phone appointment on 11/16/2016.  Copy of ICM check sent to device physician.   3 month ICM trend: 10/16/2016   1 Year ICM trend:      Rosalene Billings, RN 10/16/2016 10:31 AM

## 2016-10-19 ENCOUNTER — Other Ambulatory Visit: Payer: Self-pay

## 2016-10-19 DIAGNOSIS — M19079 Primary osteoarthritis, unspecified ankle and foot: Secondary | ICD-10-CM

## 2016-10-19 NOTE — Telephone Encounter (Signed)
I am going to hold on this oxycodone refill. High risk for diversion here. Per Allena Katz last note, patient told him he hardly uses the percocet, u tox in January negative for opioids, but database shows consistent refills every 30 days. Dr. Allena Katz can call the patient next week when he returns from vacation to better assess if he is taking this medicine and then follow up with his visit on 4/25. But by this description of use compared to dispensing, I think the patient likely has a substantial supply of unused pills at home.

## 2016-10-19 NOTE — Telephone Encounter (Signed)
Requesting pain med to be filled.  

## 2016-10-23 ENCOUNTER — Other Ambulatory Visit (HOSPITAL_COMMUNITY): Payer: Self-pay | Admitting: Cardiology

## 2016-10-25 MED ORDER — OXYCODONE-ACETAMINOPHEN 10-325 MG PO TABS: 1.0000 | ORAL_TABLET | Freq: Two times a day (BID) | ORAL | 0 refills | Status: DC | PRN

## 2016-10-25 NOTE — Telephone Encounter (Signed)
Pt calling about rx

## 2016-10-25 NOTE — Telephone Encounter (Signed)
Spoke to patient over phone regarding refill request of Oxycodone-APAP 10-325 mg. Patient has been receiving #90 per month. He tells me he generally takes the medication when his pain flares up, usually 1-2 times per day but does go days where he does not need to take it. Review of [facility] database shows consistent refills every 30 days. I did discuss with him reducing his monthly supply given his reported frequency of use. Will refill Oxycodone-APAP 10-325 mg at #60 to use q12h only as needed. He will follow up with me on 4/25 to revisit his pain management and assessment of control or diversion. Patient agreeable to reduced monthly amount.

## 2016-10-26 ENCOUNTER — Ambulatory Visit (HOSPITAL_COMMUNITY)
Admission: RE | Admit: 2016-10-26 | Discharge: 2016-10-26 | Disposition: A | Discharge status: Home / Self Care | Payer: Medicaid Other | Source: Ambulatory Visit | Attending: Internal Medicine | Admitting: Internal Medicine

## 2016-10-26 ENCOUNTER — Encounter (HOSPITAL_COMMUNITY): Payer: Self-pay

## 2016-10-26 VITALS — BP 132/86 | HR 89 | Wt 320.8 lb

## 2016-10-26 DIAGNOSIS — M1000 Idiopathic gout, unspecified site: Secondary | ICD-10-CM | POA: Diagnosis not present

## 2016-10-26 DIAGNOSIS — M109 Gout, unspecified: Secondary | ICD-10-CM | POA: Insufficient documentation

## 2016-10-26 DIAGNOSIS — I5022 Chronic systolic (congestive) heart failure: Secondary | ICD-10-CM

## 2016-10-26 DIAGNOSIS — Z6841 Body Mass Index (BMI) 40.0 and over, adult: Secondary | ICD-10-CM | POA: Diagnosis not present

## 2016-10-26 DIAGNOSIS — E785 Hyperlipidemia, unspecified: Secondary | ICD-10-CM | POA: Insufficient documentation

## 2016-10-26 DIAGNOSIS — I429 Cardiomyopathy, unspecified: Secondary | ICD-10-CM | POA: Diagnosis not present

## 2016-10-26 DIAGNOSIS — Z7901 Long term (current) use of anticoagulants: Secondary | ICD-10-CM | POA: Diagnosis not present

## 2016-10-26 DIAGNOSIS — N183 Chronic kidney disease, stage 3 unspecified: Secondary | ICD-10-CM

## 2016-10-26 DIAGNOSIS — I440 Atrioventricular block, first degree: Secondary | ICD-10-CM | POA: Diagnosis not present

## 2016-10-26 DIAGNOSIS — N529 Male erectile dysfunction, unspecified: Secondary | ICD-10-CM | POA: Diagnosis not present

## 2016-10-26 DIAGNOSIS — G4733 Obstructive sleep apnea (adult) (pediatric): Secondary | ICD-10-CM

## 2016-10-26 DIAGNOSIS — I484 Atypical atrial flutter: Secondary | ICD-10-CM | POA: Diagnosis not present

## 2016-10-26 DIAGNOSIS — I130 Hypertensive heart and chronic kidney disease with heart failure and stage 1 through stage 4 chronic kidney disease, or unspecified chronic kidney disease: Secondary | ICD-10-CM | POA: Insufficient documentation

## 2016-10-26 LAB — BASIC METABOLIC PANEL
Anion gap: 10 (ref 5–15)
BUN: 37 mg/dL — AB (ref 6–20)
CHLORIDE: 98 mmol/L — AB (ref 101–111)
CO2: 34 mmol/L — AB (ref 22–32)
Calcium: 9.7 mg/dL (ref 8.9–10.3)
Creatinine, Ser: 3.07 mg/dL — ABNORMAL HIGH (ref 0.61–1.24)
GFR calc Af Amer: 25 mL/min — ABNORMAL LOW (ref >60)
GFR calc non Af Amer: 21 mL/min — ABNORMAL LOW (ref >60)
GLUCOSE: 97 mg/dL (ref 65–99)
Potassium: 3.7 mmol/L (ref 3.5–5.1)
Sodium: 142 mmol/L (ref 135–145)

## 2016-10-26 LAB — MAGNESIUM: Magnesium: 2.6 mg/dL — ABNORMAL HIGH (ref 1.7–2.4)

## 2016-10-26 NOTE — Telephone Encounter (Signed)
Rx ready for pick up ; pt called / informed. 
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
## 2016-10-26 NOTE — Patient Instructions (Signed)
Routine lab work today. Will notify you of abnormal results, otherwise no news is good news!  No changes to medication at this time.  Follow up 2-3 months with echocardiogram and appointment with Dr. Shirlee Latch.  Do the following things EVERYDAY: 1) Weigh yourself in the morning before breakfast. Write it down and keep it in a log. 2) Take your medicines as prescribed 3) Eat low salt foods-Limit salt (sodium) to 2000 mg per day.  4) Stay as active as you can everyday 5) Limit all fluids for the day to less than 2 liters

## 2016-10-27 ENCOUNTER — Telehealth: Payer: Self-pay

## 2016-10-27 ENCOUNTER — Telehealth (HOSPITAL_COMMUNITY): Payer: Self-pay | Admitting: Cardiology

## 2016-10-27 DIAGNOSIS — I509 Heart failure, unspecified: Secondary | ICD-10-CM

## 2016-10-27 NOTE — Telephone Encounter (Signed)
Requesting PA on oxyCODONE-acetaminophen (PERCOCET) 10-325 MG tablet. 

## 2016-10-27 NOTE — Telephone Encounter (Signed)
Patient aware. WILL REPEAT LABS 4/18, SCHEDULED FOR FOLLOW UP WITH [facility] 4/25

## 2016-10-27 NOTE — Telephone Encounter (Signed)
-----   Message from Graciella Freer, PA-C sent at 10/26/2016  1:25 PM EDT ----- Near his baseline but mildly elevated.   Please recheck 2 weeks. (BMET)    Casimiro Needle "[employer], PA-C 10/26/2016 1:24 PM

## 2016-10-31 ENCOUNTER — Other Ambulatory Visit: Payer: Self-pay

## 2016-10-31 NOTE — Telephone Encounter (Signed)
Calling back to speak with a nurse about PA on   oxyCODONE-acetaminophen (PERCOCET) 10-325 MG tablet.

## 2016-11-03 ENCOUNTER — Telehealth: Payer: Self-pay | Admitting: *Deleted

## 2016-11-03 NOTE — Telephone Encounter (Signed)
Prior Authorization information was sent to [facility] Tr[facility] approved 11/03/2016 thru 03/03/2017.  Patient was informed of the approval and asked what Pharmacy he uses.[facility] [place] given approval information.  Angelina Ok, RN 11/03/2016 4:13 PM.

## 2016-11-06 NOTE — Telephone Encounter (Signed)
Call made to [facility] Tracks-states PA was done on oxycontin instead of oxycodone/acetaminophen (percocet) 10-325mg .  PA resubmitted on via [place] Tracks.  Will contact both pharmacy and patient once a decision has been made.[name], Shaan Rhoads Cass[place]4/16/201810:09 AM    [phone] Janet Berlin  [phone] T  Mansel Ortlieb 11/06/2016 SUSPENDED DMA Status: Pending Review

## 2016-11-06 NOTE — Telephone Encounter (Signed)
Call from patient stating he contacted the pharmacy and they are still showing that rx needs a PA.  Contacted pharmacy to confirm and rx still showing it needs a PA.  Will contact [place] Tracks for further info.

## 2016-11-06 NOTE — Telephone Encounter (Signed)
complete

## 2016-11-06 NOTE — Telephone Encounter (Signed)
[id number] Eric Murillo  PHARMACY [phone] T  Eric Murillo 11/06/2016  APPROVED 11/06/2016 - 05/05/2017  PA approved will notify both pharmacy and patient.[name], [name]4/16/201811:04 AM

## 2016-11-08 ENCOUNTER — Ambulatory Visit (HOSPITAL_COMMUNITY)
Admission: RE | Admit: 2016-11-08 | Discharge: 2016-11-08 | Disposition: A | Discharge status: Home / Self Care | Payer: Medicaid Other | Source: Ambulatory Visit | Attending: Cardiology | Admitting: Cardiology

## 2016-11-08 DIAGNOSIS — I509 Heart failure, unspecified: Secondary | ICD-10-CM | POA: Diagnosis not present

## 2016-11-08 LAB — BASIC METABOLIC PANEL
ANION GAP: 11 (ref 5–15)
BUN: 31 mg/dL — ABNORMAL HIGH (ref 6–20)
CALCIUM: 9.7 mg/dL (ref 8.9–10.3)
CHLORIDE: 100 mmol/L — AB (ref 101–111)
CO2: 31 mmol/L (ref 22–32)
Creatinine, Ser: 2.77 mg/dL — ABNORMAL HIGH (ref 0.61–1.24)
GFR calc non Af Amer: 24 mL/min — ABNORMAL LOW (ref >60)
GFR, EST AFRICAN AMERICAN: 28 mL/min — AB (ref >60)
Glucose, Bld: 130 mg/dL — ABNORMAL HIGH (ref 65–99)
POTASSIUM: 3.9 mmol/L (ref 3.5–5.1)
Sodium: 142 mmol/L (ref 135–145)

## 2016-11-09 ENCOUNTER — Other Ambulatory Visit: Payer: Self-pay

## 2016-11-09 ENCOUNTER — Other Ambulatory Visit: Payer: Self-pay | Admitting: Cardiology

## 2016-11-09 MED ORDER — APIXABAN 5 MG PO TABS: 5.0000 mg | ORAL_TABLET | Freq: Two times a day (BID) | ORAL | 1 refills | Status: DC

## 2016-11-09 NOTE — Telephone Encounter (Signed)
apixaban (ELIQUIS) 5 MG TABS tablet, refill [facility] [place].

## 2016-11-15 ENCOUNTER — Ambulatory Visit (INDEPENDENT_AMBULATORY_CARE_PROVIDER_SITE_OTHER): Payer: Medicaid Other | Admitting: Internal Medicine

## 2016-11-15 ENCOUNTER — Encounter: Payer: Self-pay | Admitting: Internal Medicine

## 2016-11-15 VITALS — BP 121/79 | HR 80 | Temp 98.5°F | Wt 330.4 lb

## 2016-11-15 DIAGNOSIS — I484 Atypical atrial flutter: Secondary | ICD-10-CM

## 2016-11-15 DIAGNOSIS — Z95810 Presence of automatic (implantable) cardiac defibrillator: Secondary | ICD-10-CM | POA: Diagnosis not present

## 2016-11-15 DIAGNOSIS — E669 Obesity, unspecified: Secondary | ICD-10-CM

## 2016-11-15 DIAGNOSIS — M1990 Unspecified osteoarthritis, unspecified site: Secondary | ICD-10-CM

## 2016-11-15 DIAGNOSIS — H029 Unspecified disorder of eyelid: Secondary | ICD-10-CM | POA: Insufficient documentation

## 2016-11-15 DIAGNOSIS — I4891 Unspecified atrial fibrillation: Secondary | ICD-10-CM

## 2016-11-15 DIAGNOSIS — Z79891 Long term (current) use of opiate analgesic: Secondary | ICD-10-CM | POA: Diagnosis not present

## 2016-11-15 DIAGNOSIS — N183 Chronic kidney disease, stage 3 unspecified: Secondary | ICD-10-CM

## 2016-11-15 DIAGNOSIS — Z7901 Long term (current) use of anticoagulants: Secondary | ICD-10-CM

## 2016-11-15 DIAGNOSIS — I5022 Chronic systolic (congestive) heart failure: Secondary | ICD-10-CM

## 2016-11-15 NOTE — Patient Instructions (Addendum)
It was a pleasure to see you again Eric Murillo.  For your fluid, take your Metolazone once tonight. Increase your Torsemide to 4 pills in the morning and 4 pills at night (80 mg twice a day) for the next 3 days and then back to your regular doses.  Please keep a diary of when you are taking your pain medications and how often and bring this the next time you come to clinic.  I will send a referral to an eye doctor for the bump on your eyelid.  Please follow up with [place] in 1-2 weeks.

## 2016-11-15 NOTE — Progress Notes (Signed)
CC: CHF  HPI:  [name] is a 56 y.o. male with PMH as listed below including HFrEF, NICM s/p ICD, Atrial Flutter on Eliquis, HTN, CKD 3, OSA, Obesity, and Osteoarthritis with Chronic Pain who presents for follow up management of his CHF and Chronic Pain. Please see problem based charting for status of patient's chronic medical issues.  HFrEF: Follows with Cardiology. Reports increased shortness of breath from his baseline over the last two weeks with ambulation and feels he has increased lower extremity swelling. He has been taking his usual Torsemide 80 mg am and 60 mg pm with last dose prior to this visit. He also has Metolozone 2.5 mg prn which he last took 2 weeks ago. He does not check his weights regularly at home. He says his weight was 327 lbs they day before this visit when seen by Nephrology. He tries to limit fluid and salt intake, however does admit that he thinks he is drinking more than 1.5 L per day of water. At his baseline he is unable to sleep flat on his back and usually sleeps with his knees on the floor and upper body on the cough. His weight this visit is 330 lbs up from 320  lbs twenty days ago.   Chronic Pain with Long-term use of Opioids: Patient with chronic osteoarthritic pain previously prescribed Percocet 10-325 mg q6h #90/month as needed. This was reduced to Percocet 10-325 mg q12h prn #60/month as he reported that he was not requiring his pain medication as often as prescribed. He filled this new prescription on 11/06/16. He says that he last used the Percocet 2 weeks prior to this visit. He says that he does think he requires #60/month as the medication provides relief and allows him to complete his daily activities. He says that no one has asked him for his pain medication or tried to take them from him.  Atrial Flutter: Admitted in August 2017 for CHF exacerbation and found to have returned to atrial flutter rhythm, previously sinus after DCCV. He did  undergo a second DCCV in August with return to sinus rhythm. Patient denies any current CP or palpitations. He is on Eliquis 5 mg BID indefinitely. He denies any obvious bleeding.  CKD 3: Patient with chronic kidney disease with progression of creatinine in the mid-high 2's over the last year. His last GFR was 28 on 11/08/16. He has been referred to Nephrology and had his first appointment this past week. He is still making plenty of urine with his current diuretic regimen.   Eyelid Abnormality: Patient has noticed increased size of a bump/cyst on his left lower eyelid without any pain, irritation, discharge, or change in vision. He is requesting a referral to any eye specialist for possible removal.   Past Medical History:  Diagnosis Date  . AICD (automatic cardioverter/defibrillator) present   . Anemia of chronic disease   . Arthritis   . Atrial flutter (HCC)    DCCV 9/15  . Chest pain on exertion   . Chronic systolic congestive heart failure, NYHA class 2 (HCC)    EF 20-25% 1/16  . CKD (chronic kidney disease) stage 3, GFR 30-59 ml/min   . Dyslipidemia   . Gout    Of big toe  . Hyperglycemia   . Hyperlipidemia   . Hypertension   . Morbid obesity with BMI of 45.0-49.9, adult (HCC)   . Non-ischemic cardiomyopathy (HCC)   . Organic erectile dysfunction   . OSA (obstructive sleep apnea)   .  Pilonidal cyst   . Submandibular sialolithiasis    Right    Review of Systems:   Review of Systems  Constitutional: Negative for chills, diaphoresis and fever.  Eyes:       Bump on left eyelid  Respiratory: Negative for cough, hemoptysis and wheezing.        Increased shortness of breath from baseline with ambulation  Cardiovascular: Positive for orthopnea and leg swelling. Negative for chest pain and palpitations.  Gastrointestinal: Negative for constipation.  Genitourinary: Positive for frequency. Negative for dysuria.  Musculoskeletal: Positive for joint pain. Negative for falls.    Neurological: Negative for dizziness.     Physical Exam:  Vitals:   11/15/16 1510  BP: 121/79  Pulse: 80  Temp: 98.5 F (36.9 C)  TempSrc: Oral  SpO2: (!) 80%  Weight: (!) 330 lb 6.4 oz (149.9 kg)   Physical Exam  Constitutional: He is oriented to person, place, and time. He appears well-developed and well-nourished. No distress.  Obese male  Eyes: Right eye exhibits no discharge. Left eye exhibits no discharge. No scleral icterus.  Cyst/Chalazion left lower eyelid  Cardiovascular: Normal rate and regular rhythm.   No murmur heard. Pulmonary/Chest: Effort normal. No respiratory distress. He has no wheezes. He has no rales.  Musculoskeletal: Normal range of motion.  +2-3 pitting edema bilateral lower extremities  Neurological: He is alert and oriented to person, place, and time.  Skin: He is not diaphoretic.    Assessment & Plan:   See Encounters Tab for problem based charting.  Patient discussed with Dr. Rogelia Boga  Chronic systolic heart failure [facility]) Patient increased dyspnea on exertion from baseline, weight gain, and peripheral edema. He is not having dyspnea at rest. He is not in respiratory distress and does not have rales on ausculation of his lungs. He likely does have increased volume on board. Will plan to increase his medications temporarily for further diuresis. - Take Metolazone once after clinic - Increase Torsemide to 80 mg am and 80 mg pm for 3 days then return to usual Torsemide 80 mg am and 60 mg pm - Continue with fluid/salt restriction - Monitor weight at home - f/u in 1 week for recheck and BMET  Long-term current use of opiate analgesic Patient reports he has not taken his pain medication in 2 weeks. It is apparent that he is not requiring narcotic medications often for his osteoarthritic pain and there is risk of diversion here.   [employer] screen not performed this visit as this would be negative. I did review the [place] Controlled Substance Database  which shows that patient did fill his last prescription on 11/06/16.   Even though he is using the Percocet infrequently, he says that he thinks #60/month is needed although this is clearly excessive. I have asked him to keep a diary of when he takes his Percocet so we can better assess his actual needs and to bring this on follow up. He is not due for a refill and no prescriptions are provided this visit. Will need to strongly consider alternate means of pain control going forward.  Atrial flutter [facility]) Patient will continue Eliquis 5 mg BID for anticoagulation. His most recent creatinine clearance was 63. Will need to continue to monitor renal function while on Eliquis if renal dosing is indicated.  CKD (chronic kidney disease) stage 3, GFR 30-59 ml/min Patient now following with Nephrology. Will continue to monitor renal function. He wants to avoid dialysis if possible.  Eyelid abnormality Patient  has a bump on his left lower eyelid which appears to be a cyst or chalazion. He feels that this is growing in size and although not particularly bothersome, wants to see an eye specialist for possible removal. Will refer to Ophthalmology.

## 2016-11-16 ENCOUNTER — Ambulatory Visit (INDEPENDENT_AMBULATORY_CARE_PROVIDER_SITE_OTHER): Payer: Medicaid Other

## 2016-11-16 ENCOUNTER — Telehealth: Payer: Self-pay

## 2016-11-16 DIAGNOSIS — I5022 Chronic systolic (congestive) heart failure: Secondary | ICD-10-CM | POA: Diagnosis not present

## 2016-11-16 DIAGNOSIS — Z95810 Presence of automatic (implantable) cardiac defibrillator: Secondary | ICD-10-CM

## 2016-11-16 NOTE — Telephone Encounter (Signed)
Remote ICM transmission received.  Attempted patient call and no answer 

## 2016-11-16 NOTE — Progress Notes (Signed)
EPIC Encounter for ICM Monitoring  Patient Name: Eric Murillo is a 56 y.o. male Date: 11/16/2016 Primary Care Physican: Zada Finders, MD Primary [place] Electrophysiologist: Faustino Congress Weight:unknown                Attempted call to patient and unable to reach.  Transmission reviewed.    Thoracic impedance close to baseline.   Prescribed dosage: Torsemide 20 mg 4 tablets (80 mg total) in am and 3 tablets (60 mg total) in pm.  Metolazone 2.5 mg 1 tablet (2.5 mg total) by mouth daily as needed for  weight gain 3 lbs overnight/5 lbs in one week.   Potassium 20 mEq 2 tablets (40 mEq total) by mouth 2 (two) times daily. Take EXTRA 40 meq (2 tabs) on days you take metolazone  Labs: 11/08/2016 Creatinine 2.77, BUN 31, Potassium 3.9, Sodium 142, EGFR 24-28 10/26/2016 Creatinine 3.07, BUN 37, Potassium 3.7, Sodium 142, EGFR 21-25 10/05/2016 Creatinine 2.42, BUN 29, Potassium 3.8, Sodium 141, EGFR 28-33 09/25/2016 Creatinine 2.78, BUN 36, Potassium 3.9, Sodium 141, EGFR 24-28  09/11/2016 Creatinine 2.78, BUN 29, Potassium 4.4, Sodium 143, EGFR 24-28  08/16/2016 Creatinine 2.81, BUN 40, Potassium 3.9, Sodium 147, EGFR 24-28  08/02/2016 Creatinine 2.50, BUN 34, Potassium 4.7, Sodium 143, EGFR 28-32  06/06/2016 Creatinine 2.25, BUN 18, Potassium 4.0, Sodium 142, EGFR 31-36  05/09/2016 Creatinine 2.09, BUN 26, Potassium 4.2, Sodium 141 EGFR 34-39 05/01/2016 Creatinine 2.24, BUN 31, Potassium 4.0, Sodium 142 EGFR 27-31  04/25/2016 Creatinine 1.92, BUN 20, Potassium 3.4, Sodium 142 EGFR 38-44 04/03/2016 Creatinine 2.32, BUN 27, Potassium 4.7, Sodium 144 EGFR 30-35 03/22/2016 Creatinine 2.22, BUN 26, Potassium 4.0, Sodium 142 EGFR 32-37 03/17/2016 Creatinine 2.27, BUN 33, Potassium 3.9, Sodium 141 EGFR 31-36 03/10/2016 Creatinine 2.24, BUN 40, Potassium 4.4, Sodium 138 EGFR 31-36 03/09/2016 Creatinine 2.29, BUN 40, Potassium 3.8, Sodium 139 EGFR 30-35 03/08/2016 Creatinine 2.11, BUN  36, Potassium 3.8, Sodium 141 EGFR 34-39 03/07/2016 Creatinine 2.05, BUN 33, Potassium 4.0, Sodium 140 EGFR 35-40 03/06/2016 Creatinine 2.12, BUN 34, Potassium 3.8, Sodium 138 EGFR 33-39 03/05/2016 Creatinine 2.19, BUN 31, Potassium 4.2, Sodium 140 EGFR 32-37 02/29/2016 Creatinine 1.97, BUN 37, Potassium 3.9, Sodium 140 EGFR 36-42 02/28/2016 Creatinine 2.25, BUN 43, Potassium 3.8, Sodium 138, BNP 327.5   Recommendations: NONE - Unable to reach patient   Follow-up plan: ICM clinic phone appointment on 12/19/2016.    Copy of ICM check sent to device physician.   3 month ICM trend: 11/16/2016   1 Year ICM trend:      Rosalene Billings, RN 11/16/2016 9:58 AM

## 2016-11-17 ENCOUNTER — Other Ambulatory Visit: Payer: Self-pay | Admitting: Nephrology

## 2016-11-17 DIAGNOSIS — N183 Chronic kidney disease, stage 3 unspecified: Secondary | ICD-10-CM

## 2016-11-18 NOTE — Assessment & Plan Note (Signed)
Patient increased dyspnea on exertion from baseline, weight gain, and peripheral edema. He is not having dyspnea at rest. He is not in respiratory distress and does not have rales on ausculation of his lungs. He likely does have increased volume on board. Will plan to increase his medications temporarily for further diuresis. - Take Metolazone once after clinic - Increase Torsemide to 80 mg am and 80 mg pm for 3 days then return to usual Torsemide 80 mg am and 60 mg pm - Continue with fluid/salt restriction - Monitor weight at home - f/u in 1 week for recheck and BMET

## 2016-11-18 NOTE — Assessment & Plan Note (Signed)
Patient will continue Eliquis 5 mg BID for anticoagulation. His most recent creatinine clearance was 63. Will need to continue to monitor renal function while on Eliquis if renal dosing is indicated.

## 2016-11-18 NOTE — Assessment & Plan Note (Signed)
Patient now following with Nephrology. Will continue to monitor renal function. He wants to avoid dialysis if possible.

## 2016-11-18 NOTE — Assessment & Plan Note (Signed)
Patient reports he has not taken his pain medication in 2 weeks. It is apparent that he is not requiring narcotic medications often for his osteoarthritic pain and there is risk of diversion here.   [employer] screen not performed this visit as this would be negative. I did review the [place] Controlled Substance Database which shows that patient did fill his last prescription on 11/06/16.   Even though he is using the Percocet infrequently, he says that he thinks #60/month is needed although this is clearly excessive. I have asked him to keep a diary of when he takes his Percocet so we can better assess his actual needs and to bring this on follow up. He is not due for a refill and no prescriptions are provided this visit. Will need to strongly consider alternate means of pain control going forward.

## 2016-11-18 NOTE — Assessment & Plan Note (Signed)
Patient has a bump on his left lower eyelid which appears to be a cyst or chalazion. He feels that this is growing in size and although not particularly bothersome, wants to see an eye specialist for possible removal. Will refer to Ophthalmology.

## 2016-11-20 NOTE — Progress Notes (Signed)
Internal Medicine Clinic Attending  Case discussed with Dr. Patel,Vishal at the time of the visit.  We reviewed the resident's history and exam and pertinent patient test results.  I agree with the assessment, diagnosis, and plan of care documented in the resident's note.  

## 2016-11-22 ENCOUNTER — Other Ambulatory Visit: Payer: Self-pay

## 2016-11-22 MED ORDER — CARVEDILOL 25 MG PO TABS: 25.0000 mg | ORAL_TABLET | Freq: Two times a day (BID) | ORAL | 1 refills | Status: DC

## 2016-11-22 NOTE — Telephone Encounter (Signed)
carvedilol (COREG) 25 MG tablet, REFILL REQUEST @ [facility] [place]

## 2016-12-04 ENCOUNTER — Other Ambulatory Visit: Payer: Medicaid Other

## 2016-12-04 ENCOUNTER — Other Ambulatory Visit: Payer: Self-pay | Admitting: Internal Medicine

## 2016-12-04 DIAGNOSIS — Z79891 Long term (current) use of opiate analgesic: Secondary | ICD-10-CM

## 2016-12-04 DIAGNOSIS — M19079 Primary osteoarthritis, unspecified ankle and foot: Secondary | ICD-10-CM

## 2016-12-04 MED ORDER — OXYCODONE-ACETAMINOPHEN 10-325 MG PO TABS: 1.0000 | ORAL_TABLET | Freq: Two times a day (BID) | ORAL | 0 refills | Status: DC | PRN

## 2016-12-04 NOTE — Telephone Encounter (Signed)
Refill Request ° ° °oxyCODONE-acetaminophen (PERCOCET) 10-325 MG tablet °

## 2016-12-04 NOTE — Telephone Encounter (Signed)
Pt instructed that rx was ready for pick up-states he last taken one week ago.  Per attending md, pt will need a UDS when he picks up rx.[name], Darlene Cassady5/14/201811:02 AM

## 2016-12-04 NOTE — Telephone Encounter (Signed)
Last office visit: 11/15/2016 Last UDS: 08/02/2016 Last Refill: 11/06/16  #60 (confirmed) Next appt: none scheduled at this time

## 2016-12-04 NOTE — Addendum Note (Signed)
Addended by: Maura Crandall on: 12/04/2016 11:04 AM   Modules accepted: Orders

## 2016-12-06 ENCOUNTER — Ambulatory Visit
Admission: RE | Admit: 2016-12-06 | Discharge: 2016-12-06 | Disposition: A | Discharge status: Home / Self Care | Payer: Medicaid Other | Source: Ambulatory Visit | Attending: Nephrology | Admitting: Nephrology

## 2016-12-06 DIAGNOSIS — N183 Chronic kidney disease, stage 3 unspecified: Secondary | ICD-10-CM

## 2016-12-08 LAB — TOXASSURE SELECT,+ANTIDEPR,UR

## 2016-12-18 ENCOUNTER — Other Ambulatory Visit (HOSPITAL_COMMUNITY): Payer: Self-pay | Admitting: Cardiology

## 2016-12-19 ENCOUNTER — Ambulatory Visit (INDEPENDENT_AMBULATORY_CARE_PROVIDER_SITE_OTHER): Payer: Medicaid Other

## 2016-12-19 ENCOUNTER — Other Ambulatory Visit: Payer: Self-pay | Admitting: Internal Medicine

## 2016-12-19 DIAGNOSIS — I5022 Chronic systolic (congestive) heart failure: Secondary | ICD-10-CM

## 2016-12-19 DIAGNOSIS — Z95810 Presence of automatic (implantable) cardiac defibrillator: Secondary | ICD-10-CM

## 2016-12-19 NOTE — Progress Notes (Signed)
Patient already confirmed at call today he will take Metolazone tomorrow and he has an HF clinic office appointment scheduled 12/29/2016 for follow up.

## 2016-12-19 NOTE — Telephone Encounter (Signed)
Refill Request   metolazone (ZAROXOLYN) 2.5 MG tablet

## 2016-12-19 NOTE — Progress Notes (Signed)
EPIC Encounter for ICM Monitoring  Patient Name: Eric Murillo is a 56 y.o. male Date: 12/19/2016 Primary Care Physican: Zada Finders, MD Primary Cardiologist:[place] Electrophysiologist: Faustino Congress Weight:unknown       Heart Failure questions reviewed, pt symptomatic with some leg swelling and plans to take Metolazone tomorrow along with extra potassium as perscribed.   Thoracic impedance abnormal suggesting fluid accumulation since 12/12/2016.  Prescribed dosage: Torsemide 20 mg 4 tablets (80 mg total) in am and 3 tablets (60 mg total) in pm. Can take 2.5 mg metolazone as needed with extra 40 meq of potassium for weight gain of 3 lbs overnight or 5 lbs within one week per HF note on 10/26/2016.Potassium 20 mEq 2 tablets (40 mEq total) by mouth 2 (two) times daily.  Spironolactone 25 mg 1 tablet daily.  Labs: 11/08/2016 Creatinine 2.77, BUN 31, Potassium 3.9, Sodium 142, EGFR 24-28 10/26/2016 Creatinine 3.07, BUN 37, Potassium 3.7, Sodium 142, EGFR 21-25 10/05/2016 Creatinine 2.42, BUN 29, Potassium 3.8, Sodium 141, EGFR 28-33 09/25/2016 Creatinine 2.78, BUN 36, Potassium 3.9, Sodium 141, EGFR 24-28  09/11/2016 Creatinine 2.78, BUN 29, Potassium 4.4, Sodium 143, EGFR 24-28  08/16/2016 Creatinine 2.81, BUN 40, Potassium 3.9, Sodium 147, EGFR 24-28  01/10/2018Creatinine 2.50, BUN 34, Potassium 4.7, Sodium 143, EGFR 28-32  06/06/2016 Creatinine 2.25, BUN 18, Potassium 4.0, Sodium 142, EGFR 31-36  05/09/2016 Creatinine 2.09, BUN 26, Potassium 4.2, Sodium 141 EGFR 34-39 05/01/2016 Creatinine 2.24, BUN 31, Potassium 4.0, Sodium 142 EGFR 27-31  04/25/2016 Creatinine 1.92, BUN 20, Potassium 3.4, Sodium 142 EGFR 38-44 04/03/2016 Creatinine 2.32, BUN 27, Potassium 4.7, Sodium 144 EGFR 30-35 03/22/2016 Creatinine 2.22, BUN 26, Potassium 4.0, Sodium 142 EGFR 32-37 03/17/2016 Creatinine 2.27, BUN 33, Potassium 3.9, Sodium 141 EGFR 31-36 03/10/2016 Creatinine 2.24, BUN 40, Potassium 4.4,  Sodium 138 EGFR 31-36 03/09/2016 Creatinine 2.29, BUN 40, Potassium 3.8, Sodium 139 EGFR 30-35 03/08/2016 Creatinine 2.11, BUN 36, Potassium 3.8, Sodium 141 EGFR 34-39 03/07/2016 Creatinine 2.05, BUN 33, Potassium 4.0, Sodium 140 EGFR 35-40 03/06/2016 Creatinine 2.12, BUN 34, Potassium 3.8, Sodium 138 EGFR 33-39 03/05/2016 Creatinine 2.19, BUN 31, Potassium 4.2, Sodium 140 EGFR 32-37 02/29/2016 Creatinine 1.97, BUN 37, Potassium 3.9, Sodium 140 EGFR 36-42 02/28/2016 Creatinine 2.25, BUN 43, Potassium 3.8, Sodium 138, BNP 327.5  Recommendations:   Patient reported he will take Metolazone tomorrow morning as prescribed.    Follow-up plan: ICM clinic phone appointment on 01/04/2017 for fluid recheck and he has also has an office appointment scheduled on 12/29/2016 with HF clinic.  Copy of ICM check sent to Dr Aundra Dubin and Dr Caryl Comes for review and if any recommendations will call back.     3 month ICM trend: 12/19/2016   1 Year ICM trend:      Rosalene Billings, RN 12/19/2016 8:45 AM

## 2016-12-19 NOTE — Telephone Encounter (Signed)
Call to patient-no answer, message left on recorder to contact his cardiologist or pharmacy for a refill.Eric Murillo, Darlene Cassady5/29/201811:02 AM

## 2016-12-19 NOTE — Progress Notes (Signed)
Take metolazone dose tomorrow, followup in office.

## 2016-12-29 ENCOUNTER — Ambulatory Visit (HOSPITAL_COMMUNITY)
Admission: RE | Admit: 2016-12-29 | Discharge: 2016-12-29 | Disposition: A | Discharge status: Home / Self Care | Payer: Medicaid Other | Source: Ambulatory Visit | Attending: Internal Medicine | Admitting: Internal Medicine

## 2016-12-29 ENCOUNTER — Ambulatory Visit (HOSPITAL_BASED_OUTPATIENT_CLINIC_OR_DEPARTMENT_OTHER)
Admission: RE | Admit: 2016-12-29 | Discharge: 2016-12-29 | Disposition: A | Discharge status: Home / Self Care | Payer: Medicaid Other | Source: Ambulatory Visit | Attending: Cardiology | Admitting: Cardiology

## 2016-12-29 ENCOUNTER — Encounter (HOSPITAL_COMMUNITY): Payer: Self-pay

## 2016-12-29 VITALS — BP 135/83 | HR 77 | Wt 327.5 lb

## 2016-12-29 DIAGNOSIS — Z7901 Long term (current) use of anticoagulants: Secondary | ICD-10-CM | POA: Diagnosis not present

## 2016-12-29 DIAGNOSIS — M109 Gout, unspecified: Secondary | ICD-10-CM | POA: Diagnosis not present

## 2016-12-29 DIAGNOSIS — I484 Atypical atrial flutter: Secondary | ICD-10-CM

## 2016-12-29 DIAGNOSIS — G4733 Obstructive sleep apnea (adult) (pediatric): Secondary | ICD-10-CM | POA: Insufficient documentation

## 2016-12-29 DIAGNOSIS — E785 Hyperlipidemia, unspecified: Secondary | ICD-10-CM | POA: Diagnosis not present

## 2016-12-29 DIAGNOSIS — I5022 Chronic systolic (congestive) heart failure: Secondary | ICD-10-CM | POA: Diagnosis present

## 2016-12-29 DIAGNOSIS — Z8249 Family history of ischemic heart disease and other diseases of the circulatory system: Secondary | ICD-10-CM | POA: Diagnosis not present

## 2016-12-29 DIAGNOSIS — Z79899 Other long term (current) drug therapy: Secondary | ICD-10-CM | POA: Insufficient documentation

## 2016-12-29 DIAGNOSIS — I428 Other cardiomyopathies: Secondary | ICD-10-CM | POA: Insufficient documentation

## 2016-12-29 DIAGNOSIS — N183 Chronic kidney disease, stage 3 (moderate): Secondary | ICD-10-CM | POA: Diagnosis not present

## 2016-12-29 DIAGNOSIS — Z833 Family history of diabetes mellitus: Secondary | ICD-10-CM | POA: Diagnosis not present

## 2016-12-29 DIAGNOSIS — Z6841 Body Mass Index (BMI) 40.0 and over, adult: Secondary | ICD-10-CM | POA: Diagnosis not present

## 2016-12-29 DIAGNOSIS — I130 Hypertensive heart and chronic kidney disease with heart failure and stage 1 through stage 4 chronic kidney disease, or unspecified chronic kidney disease: Secondary | ICD-10-CM | POA: Diagnosis not present

## 2016-12-29 LAB — ECHOCARDIOGRAM COMPLETE
AVLVOTPG: 4 mmHg
CHL CUP RV SYS PRESS: 42 mmHg
CHL CUP STROKE VOLUME: 52 mL
FS: 14 % — AB (ref 28–44)
IVS/LV PW RATIO, ED: 1.03
LA ID, A-P, ES: 54 mm
LA diam end sys: 54 mm
LA vol A4C: 106 ml
LADIAMINDEX: 2.1 cm/m2
LAVOL: 104 mL
LAVOLIN: 40.5 mL/m2
LV PW d: 10.2 mm — AB (ref 0.6–1.1)
LV TDI E'LATERAL: 10.3
LV TDI E'MEDIAL: 7.4
LV dias vol: 149 mL (ref 62–150)
LVDIAVOLIN: 58 mL/m2
LVELAT: 10.3 cm/s
LVOT VTI: 18.3 cm
LVOT area: 3.8 cm2
LVOT peak vel: 97.9 cm/s
LVOTD: 22 mm
LVOTSV: 70 mL
LVSYSVOL: 97 mL — AB (ref 21–61)
LVSYSVOLIN: 38 mL/m2
MRPISAEROA: 0.15 cm2
RV LATERAL S' VELOCITY: 10.9 cm/s
RV TAPSE: 16.9 mm
Reg peak vel: 285 cm/s
Simpson's disk: 35
TRMAXVEL: 285 cm/s
VTI: 136 cm

## 2016-12-29 LAB — TSH: TSH: 1.632 u[IU]/mL (ref 0.350–4.500)

## 2016-12-29 LAB — COMPREHENSIVE METABOLIC PANEL
ALBUMIN: 4 g/dL (ref 3.5–5.0)
ALK PHOS: 97 U/L (ref 38–126)
ALT: 30 U/L (ref 17–63)
ANION GAP: 11 (ref 5–15)
AST: 32 U/L (ref 15–41)
BILIRUBIN TOTAL: 1 mg/dL (ref 0.3–1.2)
BUN: 39 mg/dL — AB (ref 6–20)
CALCIUM: 9.4 mg/dL (ref 8.9–10.3)
CO2: 32 mmol/L (ref 22–32)
CREATININE: 2.84 mg/dL — AB (ref 0.61–1.24)
Chloride: 96 mmol/L — ABNORMAL LOW (ref 101–111)
GFR calc Af Amer: 27 mL/min — ABNORMAL LOW (ref >60)
GFR calc non Af Amer: 23 mL/min — ABNORMAL LOW (ref >60)
GLUCOSE: 128 mg/dL — AB (ref 65–99)
Potassium: 3.6 mmol/L (ref 3.5–5.1)
SODIUM: 139 mmol/L (ref 135–145)
TOTAL PROTEIN: 7.2 g/dL (ref 6.5–8.1)

## 2016-12-29 MED ORDER — METOLAZONE 2.5 MG PO TABS: ORAL_TABLET | ORAL | 1 refills | Status: DC
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
## 2016-12-29 NOTE — Patient Instructions (Signed)
Labs today (will call for abnormal results, otherwise no news is good news)  START taking Metolazone 2.5 mg (1 Tablet) Every Wednesday.  Labs in 2 weeks (BMET)  Follow up in 2 Months  Please Contact the Bariatric & Wellness Program at [facility].  Please see brochure.

## 2016-12-29 NOTE — Progress Notes (Signed)
  Echocardiogram 2D Echocardiogram has been performed.  Delcie Roch 12/29/2016, 10:55 AM

## 2017-01-01 ENCOUNTER — Other Ambulatory Visit: Payer: Self-pay | Admitting: Internal Medicine

## 2017-01-03 ENCOUNTER — Other Ambulatory Visit: Payer: Self-pay

## 2017-01-04 ENCOUNTER — Ambulatory Visit (INDEPENDENT_AMBULATORY_CARE_PROVIDER_SITE_OTHER): Payer: Medicaid Other | Admitting: *Deleted

## 2017-01-04 ENCOUNTER — Telehealth: Payer: Self-pay | Admitting: Internal Medicine

## 2017-01-04 DIAGNOSIS — I428 Other cardiomyopathies: Secondary | ICD-10-CM | POA: Diagnosis not present

## 2017-01-04 DIAGNOSIS — Z95810 Presence of automatic (implantable) cardiac defibrillator: Secondary | ICD-10-CM

## 2017-01-04 DIAGNOSIS — I5022 Chronic systolic (congestive) heart failure: Secondary | ICD-10-CM

## 2017-01-04 DIAGNOSIS — M19079 Primary osteoarthritis, unspecified ankle and foot: Secondary | ICD-10-CM

## 2017-01-04 LAB — CUP PACEART REMOTE DEVICE CHECK
Battery Remaining Longevity: 86 mo
Brady Statistic RV Percent Paced: 1 %
HIGH POWER IMPEDANCE MEASURED VALUE: 79 Ohm
HighPow Impedance: 79 Ohm
Implantable Lead Model: 181
Implantable Lead Serial Number: 332395
Implantable Pulse Generator Implant Date: 20161028
Lead Channel Pacing Threshold Amplitude: 0.75 V
Lead Channel Pacing Threshold Pulse Width: 0.5 ms
Lead Channel Setting Pacing Amplitude: 2.5 V
Lead Channel Setting Pacing Pulse Width: 0.5 ms
Lead Channel Setting Sensing Sensitivity: 0.5 mV
MDC IDC LEAD IMPLANT DT: 20161028
MDC IDC LEAD LOCATION: 753860
MDC IDC MSMT BATTERY REMAINING PERCENTAGE: 86 %
MDC IDC MSMT BATTERY VOLTAGE: 3.04 V
MDC IDC MSMT LEADCHNL RV IMPEDANCE VALUE: 390 Ohm
MDC IDC MSMT LEADCHNL RV SENSING INTR AMPL: 12 mV
MDC IDC PG SERIAL: 7305918
MDC IDC SESS DTM: 20180614080016

## 2017-01-04 NOTE — Progress Notes (Addendum)
EPIC Encounter for ICM Monitoring  Patient Name: Eric Murillo is a 56 y.o. male Date: 01/04/2017 Primary Care Physican: Zada Finders, MD Primary Cardiologist:[place] Electrophysiologist: Caryl Comes Dry Weight:320 lbs       Heart Failure questions reviewed, pt asymptomatic but does say his scales and physician scales shows about 5 lbs difference.   Thoracic impedance normal but was abnormal suggesting fluid accumulation from 5/22/201/ to 12/22/2016.  Prescribed dosage: Torsemide 20 mg 4 tablets (80 mg total) in AM and 3 tablets (60 mg total) in PM. Metolazone 2.5 mg tablet weekly, every Wednesday with 40 mEq of Potassium.  Potassium 20 mEq 2 tablets (40 mEq total) by mouth 2 (two) times daily.  Spironolactone 25 mg 1 tablet daily.  Labs: 12/29/2016 Creatinine 2.84, BUN 39, Potassium 3.6, Sodium 139, EGFR 23-27 11/08/2016 Creatinine 2.77, BUN 31, Potassium 3.9, Sodium 142, EGFR 24-28 10/26/2016 Creatinine 3.07, BUN 37, Potassium 3.7, Sodium 142, EGFR 21-25 10/05/2016 Creatinine 2.42, BUN 29, Potassium 3.8, Sodium 141, EGFR 28-33 09/25/2016 Creatinine 2.78, BUN 36, Potassium 3.9, Sodium 141, EGFR 24-28  09/11/2016 Creatinine 2.78, BUN 29, Potassium 4.4, Sodium 143, EGFR 24-28  08/16/2016 Creatinine 2.81, BUN 40, Potassium 3.9, Sodium 147, EGFR 24-28  01/10/2018Creatinine 2.50, BUN 34, Potassium 4.7, Sodium 143, EGFR 28-32  06/06/2016 Creatinine 2.25, BUN 18, Potassium 4.0, Sodium 142, EGFR 31-36  05/09/2016 Creatinine 2.09, BUN 26, Potassium 4.2, Sodium 141 EGFR 34-39  Recommendations: No changes.  Advised to limit salt intake to 2000 mg/day and fluid intake to < 2 liters/day.  Encouraged to call for fluid symptoms.  Follow-up plan: ICM clinic phone appointment on 01/22/2017.  Office appointment scheduled on 02/28/2017 with Dr. Aundra Dubin.  Copy of ICM check sent to primary cardiologist and device physician.   3 month ICM trend: 01/04/2017   1 Year ICM trend:      Rosalene Billings,  RN 01/04/2017 11:27 AM

## 2017-01-04 NOTE — Progress Notes (Signed)
Remote ICD transmission.   

## 2017-01-04 NOTE — Telephone Encounter (Signed)
Refill Request ° ° °oxyCODONE-acetaminophen (PERCOCET) 10-325 MG tablet °

## 2017-01-05 ENCOUNTER — Encounter: Payer: Self-pay | Admitting: Cardiology

## 2017-01-05 NOTE — Telephone Encounter (Signed)
Last visit: 11/15/2016 Last UDS: 12/04/2016 Next appointment: none Last refill: 12/04/2016

## 2017-01-10 NOTE — Telephone Encounter (Signed)
Pt was informed of dr vincent's response, he states he will talk to dr patel at that time

## 2017-01-10 NOTE — Telephone Encounter (Signed)
Not appropriate for refill at this time. UDS on 5/14 was negative for oxycodone, he had filled #60 on 4/16. Dr. Allena Katz last visit identifies risk for diversion. He has an appointment with Dr. Allena Katz on 7/18 which should be fine to discuss, or Dr. Allena Katz can call him to see how his chronic pain is going.

## 2017-01-10 NOTE — Telephone Encounter (Signed)
Needs to speak with a nurse about pain meds. Please call pt back.  

## 2017-01-12 ENCOUNTER — Ambulatory Visit (HOSPITAL_COMMUNITY)
Admission: RE | Admit: 2017-01-12 | Discharge: 2017-01-12 | Disposition: A | Discharge status: Home / Self Care | Payer: Medicaid Other | Source: Ambulatory Visit | Attending: Cardiology | Admitting: Cardiology

## 2017-01-12 DIAGNOSIS — I5022 Chronic systolic (congestive) heart failure: Secondary | ICD-10-CM | POA: Diagnosis not present

## 2017-01-12 LAB — BASIC METABOLIC PANEL
Anion gap: 11 (ref 5–15)
BUN: 43 mg/dL — AB (ref 6–20)
CHLORIDE: 98 mmol/L — AB (ref 101–111)
CO2: 31 mmol/L (ref 22–32)
CREATININE: 2.92 mg/dL — AB (ref 0.61–1.24)
Calcium: 8.9 mg/dL (ref 8.9–10.3)
GFR calc Af Amer: 26 mL/min — ABNORMAL LOW (ref >60)
GFR calc non Af Amer: 23 mL/min — ABNORMAL LOW (ref >60)
GLUCOSE: 124 mg/dL — AB (ref 65–99)
POTASSIUM: 3.5 mmol/L (ref 3.5–5.1)
Sodium: 140 mmol/L (ref 135–145)

## 2017-01-19 ENCOUNTER — Encounter: Payer: Self-pay | Admitting: Cardiology

## 2017-01-22 ENCOUNTER — Ambulatory Visit (INDEPENDENT_AMBULATORY_CARE_PROVIDER_SITE_OTHER): Payer: Medicaid Other

## 2017-01-22 DIAGNOSIS — I5022 Chronic systolic (congestive) heart failure: Secondary | ICD-10-CM

## 2017-01-22 DIAGNOSIS — Z95810 Presence of automatic (implantable) cardiac defibrillator: Secondary | ICD-10-CM | POA: Diagnosis not present

## 2017-01-22 NOTE — Progress Notes (Signed)
EPIC Encounter for ICM Monitoring  Patient Name: Seve Monette is a 56 y.o. male Date: 01/22/2017 Primary Care Physican: Zada Finders, MD Primary Cardiologist:[place] Electrophysiologist: Caryl Comes Dry Weight:320 lbs        Heart Failure questions reviewed, pt asymptomatic.   Thoracic impedance normal  Prescribed dosage: Torsemide 20 mg 4 tablets (80 mg total) in AM and 3 tablets (60 mg total) in PM. Metolazone 2.5 mg 1 tablet every Wednesday with 40 mEq of Potassium.  Potassium 20 mEq 2 tablets (40 mEq total) by mouth 2 (two) times daily. Spironolactone 25 mg 1 tablet daily.  Labs: 01/12/2017 Creatinine 2.92, BUN 43, Potassium 3.5, Sodium 140, EGFR 23-26 12/29/2016 Creatinine 2.84, BUN 39, Potassium 3.6, Sodium 139, EGFR 23-27 11/08/2016 Creatinine 2.77, BUN 31, Potassium 3.9, Sodium 142, EGFR 24-28 10/26/2016 Creatinine 3.07, BUN 37, Potassium 3.7, Sodium 142, EGFR 21-25 10/05/2016 Creatinine 2.42, BUN 29, Potassium 3.8, Sodium 141, EGFR 28-33 09/25/2016 Creatinine 2.78, BUN 36, Potassium 3.9, Sodium 141, EGFR 24-28  09/11/2016 Creatinine 2.78, BUN 29, Potassium 4.4, Sodium 143, EGFR 24-28  08/16/2016 Creatinine 2.81, BUN 40, Potassium 3.9, Sodium 147, EGFR 24-28  01/10/2018Creatinine 2.50, BUN 34, Potassium 4.7, Sodium 143, EGFR 28-32  06/06/2016 Creatinine 2.25, BUN 18, Potassium 4.0, Sodium 142, EGFR 31-36  05/09/2016 Creatinine 2.09, BUN 26, Potassium 4.2, Sodium 141, EGFR 34-39  Recommendations: No changes.  Advised to limit salt intake to 2000 mg/day and fluid intake to < 2 liters/day.  Encouraged to call for fluid symptoms.  Follow-up plan: ICM clinic phone appointment on 02/22/2017.  Office appointment scheduled 02/28/2017 with HF clinic.  Copy of ICM check sent to device physician.   3 month ICM trend: 01/22/2017   1 Year ICM trend:      Rosalene Billings, RN 01/22/2017 8:15 AM

## 2017-01-31 ENCOUNTER — Other Ambulatory Visit (HOSPITAL_COMMUNITY): Payer: Self-pay | Admitting: Cardiology

## 2017-02-07 ENCOUNTER — Encounter: Payer: Self-pay | Admitting: Internal Medicine

## 2017-02-07 ENCOUNTER — Ambulatory Visit (INDEPENDENT_AMBULATORY_CARE_PROVIDER_SITE_OTHER): Payer: Medicaid Other | Admitting: Internal Medicine

## 2017-02-07 VITALS — BP 127/78 | HR 92 | Temp 97.5°F | Ht 69.5 in | Wt 340.0 lb

## 2017-02-07 DIAGNOSIS — M19072 Primary osteoarthritis, left ankle and foot: Secondary | ICD-10-CM | POA: Diagnosis not present

## 2017-02-07 DIAGNOSIS — I130 Hypertensive heart and chronic kidney disease with heart failure and stage 1 through stage 4 chronic kidney disease, or unspecified chronic kidney disease: Secondary | ICD-10-CM

## 2017-02-07 DIAGNOSIS — Z79899 Other long term (current) drug therapy: Secondary | ICD-10-CM | POA: Diagnosis not present

## 2017-02-07 DIAGNOSIS — G4733 Obstructive sleep apnea (adult) (pediatric): Secondary | ICD-10-CM | POA: Diagnosis not present

## 2017-02-07 DIAGNOSIS — I5022 Chronic systolic (congestive) heart failure: Secondary | ICD-10-CM

## 2017-02-07 DIAGNOSIS — M19071 Primary osteoarthritis, right ankle and foot: Secondary | ICD-10-CM

## 2017-02-07 DIAGNOSIS — Z6841 Body Mass Index (BMI) 40.0 and over, adult: Secondary | ICD-10-CM | POA: Diagnosis not present

## 2017-02-07 DIAGNOSIS — M170 Bilateral primary osteoarthritis of knee: Secondary | ICD-10-CM

## 2017-02-07 DIAGNOSIS — Z7901 Long term (current) use of anticoagulants: Secondary | ICD-10-CM

## 2017-02-07 DIAGNOSIS — Z79891 Long term (current) use of opiate analgesic: Secondary | ICD-10-CM

## 2017-02-07 DIAGNOSIS — Z95810 Presence of automatic (implantable) cardiac defibrillator: Secondary | ICD-10-CM | POA: Diagnosis not present

## 2017-02-07 DIAGNOSIS — N184 Chronic kidney disease, stage 4 (severe): Secondary | ICD-10-CM

## 2017-02-07 DIAGNOSIS — I4892 Unspecified atrial flutter: Secondary | ICD-10-CM | POA: Diagnosis not present

## 2017-02-07 DIAGNOSIS — M19079 Primary osteoarthritis, unspecified ankle and foot: Secondary | ICD-10-CM

## 2017-02-07 DIAGNOSIS — I484 Atypical atrial flutter: Secondary | ICD-10-CM

## 2017-02-07 MED ORDER — DICLOFENAC SODIUM 1 % TD GEL: 4.0000 g | Freq: Four times a day (QID) | TRANSDERMAL | 2 refills | Status: AC

## 2017-02-07 MED ORDER — ACETAMINOPHEN 500 MG PO TABS: 1000.0000 mg | ORAL_TABLET | Freq: Three times a day (TID) | ORAL | 2 refills | Status: AC | PRN

## 2017-02-07 NOTE — Assessment & Plan Note (Addendum)
His atrial flutter is currently stable and appears to be in sinus rhythm on exam. He will continue anticoagulation with Eliquis 5 mg BID. If his Creatinine Clearance approaches 30 or lower, will need to consider reducing to 2.5 mg BID. He has not noticed any obvious bleeding.

## 2017-02-07 NOTE — Progress Notes (Signed)
Medicine attending: Medical history, presenting problems, physical findings, and medications, reviewed with resident physician Dr Vishal Patel on the day of the patient visit and I concur with his evaluation and management plan. 

## 2017-02-07 NOTE — Patient Instructions (Signed)
It was a pleasure to see you again [name].  Your weight today is up to 340 lbs from 327 lbs 6 weeks ago.  Please take your Metolazone today and increase your Torsemide to 80 mg twice a day for 3 days then resume your usual doses of 80 mg in the morning and 60 mg in the evening.  Please try Tylenol 1000 mg every 8 hours as needed for pain. I am also sending Voltaren gel for pain which you can use four times per day. I am referring you to physical therapy as well.  Please contact your Sleep Apnea equipment providers to ask about Nasal Pillows which may be more comfortable for you.  Please follow up in our clinic next week.   Arthritis Arthritis means joint pain. It can also mean joint disease. A joint is a place where bones come together. People who have arthritis may have:  Red joints.  Swollen joints.  Stiff joints.  Warm joints.  A fever.  A feeling of being sick.  Follow these instructions at home: Pay attention to any changes in your symptoms. Take these actions to help with your pain and swelling. Medicines  Take over-the-counter and prescription medicines only as told by your doctor.  Do not take aspirin for pain if your doctor says that you may have gout. Activity  Rest your joint if your doctor tells you to.  Avoid activities that make the pain worse.  Exercise your joint regularly as told by your doctor. Try doing exercises like: ? Swimming. ? Water aerobics. ? Biking. ? Walking. Joint Care   If your joint is swollen, keep it raised (elevated) if told by your doctor.  If your joint feels stiff in the morning, try taking a warm shower.  If you have diabetes, do not apply heat without asking your doctor.  If told, apply heat to the joint: ? Put a towel between the joint and the hot pack or heating pad. ? Leave the heat on the area for 20-30 minutes.  If told, apply ice to the joint: ? Put ice in a plastic bag. ? Place a towel between your skin  and the bag. ? Leave the ice on for 20 minutes, 2-3 times per day.  Keep all follow-up visits as told by your doctor. Contact a doctor if:  The pain gets worse.  You have a fever. Get help right away if:  You have very bad pain in your joint.  You have swelling in your joint.  Your joint is red.  Many joints become painful and swollen.  You have very bad back pain.  Your leg is very weak.  You cannot control your pee (urine) or poop (stool). This information is not intended to replace advice given to you by your health care provider. Make sure you discuss any questions you have with your health care provider. Document Released: 10/04/2009 Document Revised: 12/16/2015 Document Reviewed: 10/05/2014 Elsevier Interactive Patient Education  [employer].

## 2017-02-07 NOTE — Assessment & Plan Note (Signed)
Patient has not taken prescription opiates for about 1 month now. Last refill was dispensed on 12/04/16 confirmed on the [place] controlled substance database. His last two ToxAssures were negative for Percocet. Given his infrequent need for Percocet (and questionable diversion), I do not think prescription narcotics are further indicated for his chronic osteoarthritic pain. - d/c Percocet - Tylenol 1000 mg q8h prn - Voltaren gel - PT referral - no NSAIDS given CKD

## 2017-02-07 NOTE — Assessment & Plan Note (Signed)
Patient with severe OSA not currently using his CPAP due to discomfort. I think he may be able to use the Nasal Pillows for his sleep apnea as this will not cover his mouth during sleep. He is advised to contact his equipment providers to see if this is an option. We can also consider oral appliances as well. Weight loss is encouraged and he is working with the bariatric program now.

## 2017-02-07 NOTE — Progress Notes (Signed)
CC: CHF  HPI:  [name] is a 56 y.o. male with PMH as listed below including HFrEF, NICM s/p ICD, Atrial Flutter s/p DCCV x 2 on Eliquis, HTN, CKD 3, OSA, Obesity, Gout, and Osteoarthritis with Chronic Pain who presents for follow up management of his CHF and Chronic Pain. Please see problem based charting for status of patient's chronic medical issues.  HFrEF: Follows with Cardiology. He is taking Torsemide 80 mg am and 60 mg pm with last doses yesterday. He also takes Metolozone 2.5 mg once weekly now on Wednesdays (previously prn once weekly). He is also taking Coreg 25 mg BID, Spironolactone 25 mg daily, and Bidil 40-75 mg TID. His weight today is 340 lbs compared to 327 lbs 6 weeks ago. He tries to limit fluid and salt intake, however does admit that he thinks he is overdoing with respect to fluid intake. At his baseline he is unable to sleep flat on his back and usually sleeps with his knees on the floor and upper body on the couch. He reports some dyspnea when bending over and when walking about 50 feet. He has noticed increased swelling in his legs. He does not elevate his legs at home. He is not exercising but states he is now being evaluated by the bariatric program.  Osteoarthritis with Chronic Pain and Long-term use of Opioids: Patient with chronic osteoarthritic pain previously prescribed Percocet 10-325 mg q6h #90/month as needed. This was reduced to Percocet 10-325 mg q12h prn #60/month as he reported that he was not requiring his pain medication as often as prescribed. He last was prescribed and filled his Percocet on 12/04/16. His last two ToxAssure screens were negative for Percocet. He has not tried anything else for pain including Tylenol. He says he has received cortisone injections in his knees in the past.  Atrial Flutter: Admitted in August 2017 for CHF exacerbation and found to have returned to atrial flutter rhythm, previously sinus after DCCV. He did undergo a  second DCCV in August with return to sinus rhythm. Patient denies any current CP or palpitations. He is on Eliquis 5 mg BID indefinitely and Amiodarone 200 mg daily. He denies any obvious bleeding.  OSA: Patient with severe OSA (diagnosed 2013) non-adherent to CPAP due to claustrophobia with the masks. He is unable to sleep flat on his back and usually sleeps with his knees on the floor and upper body on the couch.  CKD 4: Patient with chronic kidney disease with progression of creatinine in the mid-high 2's over the last year. His last GFR was 26 on 01/12/17. He is following with Nephrology now, seen by Dr. Hyman Hopes on 11/14/16. He is still making plenty of urine with his current diuretic regimen.    Past Medical History:  Diagnosis Date  . AICD (automatic cardioverter/defibrillator) present   . Anemia of chronic disease   . Arthritis   . Atrial flutter (HCC)    DCCV 9/15  . Chest pain on exertion   . Chronic systolic congestive heart failure, NYHA class 2 (HCC)    EF 20-25% 1/16  . CKD (chronic kidney disease) stage 3, GFR 30-59 ml/min   . Dyslipidemia   . Gout    Of big toe  . Hyperglycemia   . Hyperlipidemia   . Hypertension   . Morbid obesity with BMI of 45.0-49.9, adult (HCC)   . Non-ischemic cardiomyopathy (HCC)   . Organic erectile dysfunction   . OSA (obstructive sleep apnea)   . Pilonidal  cyst   . Submandibular sialolithiasis    Right   Review of Systems:   Review of Systems  Constitutional: Negative for chills, fever and weight loss.  HENT: Negative for nosebleeds.   Respiratory: Negative for cough, hemoptysis, sputum production and shortness of breath.   Cardiovascular: Positive for orthopnea and leg swelling. Negative for chest pain and palpitations.  Gastrointestinal: Negative for blood in stool, constipation, diarrhea, melena, nausea and vomiting.  Musculoskeletal: Positive for joint pain. Negative for falls.  Neurological: Negative for dizziness and loss of  consciousness.     Physical Exam:  Vitals:   02/07/17 1412  BP: 127/78  Pulse: 92  Temp: (!) 97.5 F (36.4 C)  TempSrc: Oral  SpO2: 97%  Weight: (!) 340 lb (154.2 kg)  Height: 5' 9.5" (1.765 m)   Physical Exam  Constitutional: He is oriented to person, place, and time. No distress.  Morbidly obese  Cardiovascular: Normal rate and regular rhythm.   Pulmonary/Chest: Effort normal. No respiratory distress. He has no wheezes. He has no rales.  Abdominal: Soft. There is no tenderness.  Large abdomen  Musculoskeletal: He exhibits edema. He exhibits no tenderness.  +3 pitting edema up to knees  Neurological: He is alert and oriented to person, place, and time.  Skin: Skin is dry. He is not diaphoretic.    Assessment & Plan:   See Encounters Tab for problem based charting.  Patient discussed with Dr. Cyndie Chime  Chronic systolic heart failure [facility]) Patient with weight gain up to 340 lbs from 327 lbs over the last 6 weeks. He has dyspnea with bending over and walking about 50 feet. He reports increased fluid intake. He is volume overloaded on exam without any sign of respiratory distress and appears well-compensated. He has not taken any of his medications today. I have advised him to take his Metolazone today as scheduled and increase his Torsemide to 80 mg BID for 3 days then resume his usual dosing. He should continue to limit salt intake < 2 grams daily and fluid to < 2 L daily in addition to monitoring his weight at home. I have advised him to follow up with next week for a recheck. - Take Metolazone 2.5 mg today as scheduled - Increase Torsemide to 80 mg BID for 3 days then resume home dose (80 am and 60 pm) - Continue Coreg 25 mg BID, Spironolactone 25 mg daily, and Bidil 40-75 mg TID - Check BMET today - Fluid/salt restriction  Osteoarthritis Patient has chronic osteoarthritic type pain effecting his ankles and knees. He was previously prescribed Percocet 10-325 mg q6h  #90/month as needed which was reduced to Percocet 10-325 mg q12h prn #60/month as he was not requiring his pain medication as often as prescribed. His last prescription was filled on 12/04/16. He has had two consecutive negative ToxAssures (08/02/16 & 12/04/16). Due to decreased frequency of use and negative ToxAssure, I do not feel that opioid medications are currently indicated to treat his osteoarthritic pain. We will now attempt pain control with Tylenol, Voltaren gel, and Physical therapy. I encouraged weight loss and he is now in the bariatric program. He is also sleeping on his knees which is likely contributing to his knee pain and is advised to avoid this if possible (he says he will get knee pads). - d/c Percocet - Tylenol 1000 mg q8h prn - Voltaren gel - Refer to PT - No NSAIDs given CKD  Atrial flutter (HCC) His atrial flutter is currently stable and  appears to be in sinus rhythm on exam. He will continue anticoagulation with Eliquis 5 mg BID. If his Creatinine Clearance approaches 30 or lower, will need to consider reducing to 2.5 mg BID. He has not noticed any obvious bleeding.  OSA (obstructive sleep apnea) Patient with severe OSA not currently using his CPAP due to discomfort. I think he may be able to use the Nasal Pillows for his sleep apnea as this will not cover his mouth during sleep. He is advised to contact his equipment providers to see if this is an option. We can also consider oral appliances as well. Weight loss is encouraged and he is working with the bariatric program now.  CKD (chronic kidney disease) stage 4, GFR 15-29 ml/min (HCC) Patient's chronic kidney disease appears to have progressed to CKD stage 4. He is following with Nephrology, Dr. Hyman Hopes, and I advised him to follow up as scheduled. He reports continued good urine output. He wants to avoid dialysis if possible. - f/u with Nephrology  Long-term current use of opiate analgesic Patient has not taken prescription  opiates for about 1 month now. Last refill was dispensed on 12/04/16 confirmed on the [place] controlled substance database. His last two ToxAssures were negative for Percocet. Given his infrequent need for Percocet (and questionable diversion), I do not think prescription narcotics are further indicated for his chronic osteoarthritic pain. - d/c Percocet - Tylenol 1000 mg q8h prn - Voltaren gel - PT referral - no NSAIDS given CKD

## 2017-02-07 NOTE — Assessment & Plan Note (Signed)
Patient with weight gain up to 340 lbs from 327 lbs over the last 6 weeks. He has dyspnea with bending over and walking about 50 feet. He reports increased fluid intake. He is volume overloaded on exam without any sign of respiratory distress and appears well-compensated. He has not taken any of his medications today. I have advised him to take his Metolazone today as scheduled and increase his Torsemide to 80 mg BID for 3 days then resume his usual dosing. He should continue to limit salt intake < 2 grams daily and fluid to < 2 L daily in addition to monitoring his weight at home. I have advised him to follow up with next week for a recheck. - Take Metolazone 2.5 mg today as scheduled - Increase Torsemide to 80 mg BID for 3 days then resume home dose (80 am and 60 pm) - Continue Coreg 25 mg BID, Spironolactone 25 mg daily, and Bidil 40-75 mg TID - Check BMET today - Fluid/salt restriction

## 2017-02-07 NOTE — Assessment & Plan Note (Addendum)
Patient has chronic osteoarthritic type pain effecting his ankles and knees. He was previously prescribed Percocet 10-325 mg q6h #90/month as needed which was reduced to Percocet 10-325 mg q12h prn #60/month as he was not requiring his pain medication as often as prescribed. His last prescription was filled on 12/04/16. He has had two consecutive negative ToxAssures (08/02/16 & 12/04/16). Due to decreased frequency of use and negative ToxAssure, I do not feel that opioid medications are currently indicated to treat his osteoarthritic pain. We will now attempt pain control with Tylenol, Voltaren gel, and Physical therapy. I encouraged weight loss and he is now in the bariatric program. He is also sleeping on his knees which is likely contributing to his knee pain and is advised to avoid this if possible (he says he will get knee pads). - d/c Percocet - Tylenol 1000 mg q8h prn - Voltaren gel - Refer to PT - No NSAIDs given CKD

## 2017-02-07 NOTE — Assessment & Plan Note (Signed)
Patient's chronic kidney disease appears to have progressed to CKD stage 4. He is following with Nephrology, Dr. Hyman Hopes, and I advised him to follow up as scheduled. He reports continued good urine output. He wants to avoid dialysis if possible. - f/u with Nephrology

## 2017-02-08 LAB — BMP8+ANION GAP
Anion Gap: 18 mmol/L (ref 10.0–18.0)
BUN / CREAT RATIO: 12 (ref 9–20)
BUN: 33 mg/dL — ABNORMAL HIGH (ref 6–24)
CALCIUM: 9.4 mg/dL (ref 8.7–10.2)
CO2: 26 mmol/L (ref 20–29)
Chloride: 99 mmol/L (ref 96–106)
Creatinine, Ser: 2.8 mg/dL — ABNORMAL HIGH (ref 0.76–1.27)
GFR, EST AFRICAN AMERICAN: 28 mL/min/{1.73_m2} — AB (ref >59)
GFR, EST NON AFRICAN AMERICAN: 24 mL/min/{1.73_m2} — AB (ref >59)
Glucose: 99 mg/dL (ref 65–99)
POTASSIUM: 4 mmol/L (ref 3.5–5.2)
Sodium: 143 mmol/L (ref 134–144)

## 2017-02-16 ENCOUNTER — Ambulatory Visit (INDEPENDENT_AMBULATORY_CARE_PROVIDER_SITE_OTHER): Payer: Medicaid Other | Admitting: Internal Medicine

## 2017-02-16 DIAGNOSIS — Z8249 Family history of ischemic heart disease and other diseases of the circulatory system: Secondary | ICD-10-CM | POA: Diagnosis not present

## 2017-02-16 DIAGNOSIS — I5022 Chronic systolic (congestive) heart failure: Secondary | ICD-10-CM | POA: Diagnosis present

## 2017-02-16 DIAGNOSIS — Z79899 Other long term (current) drug therapy: Secondary | ICD-10-CM

## 2017-02-16 NOTE — Progress Notes (Signed)
   CC: Follow up of chronic systolic heart failure  HPI:  [name] is a 56 y.o. with history noted below that presents to the acute care clinic for follow-up of his chronic systolic heart failure.  Patient states that at his last visit with his PCP his torsemide was increased to  80 mg BID for 3 days and then he was to resume his home dose of 80 mg in the morning and 60 mg at night. Patient reports compliance with this regimen and is back to his current home dose. He also states that he continues to take Coreg 25 mg BID, Spironolactone 25 mg daily, and Bidil 40-75 mg TID.  He takes metolazone 2.5 mg on wednesdays.  He states that is symptom of shortness of breath has improved however states that he still has fluid because his legs are still swollen.    Past Medical History:  Diagnosis Date  . AICD (automatic cardioverter/defibrillator) present   . Anemia of chronic disease   . Arthritis   . Atrial flutter (HCC)    DCCV 9/15  . Chest pain on exertion   . Chronic systolic congestive heart failure, NYHA class 2 (HCC)    EF 20-25% 1/16  . CKD (chronic kidney disease) stage 3, GFR 30-59 ml/min   . Dyslipidemia   . Gout    Of big toe  . Hyperglycemia   . Hyperlipidemia   . Hypertension   . Morbid obesity with BMI of 45.0-49.9, adult (HCC)   . Non-ischemic cardiomyopathy (HCC)   . Organic erectile dysfunction   . OSA (obstructive sleep apnea)   . Pilonidal cyst   . Submandibular sialolithiasis    Right    Review of Systems:  Review of Systems  Respiratory: Negative for shortness of breath and wheezing.   Cardiovascular: Positive for leg swelling. Negative for chest pain.  Neurological: Negative for dizziness.     Physical Exam:  Vitals:   02/16/17 1028  BP: 113/69  Pulse: 81  Temp: 98.2 F (36.8 C)  TempSrc: Oral  SpO2: 97%  Weight: (!) 337 lb 3.2 oz (153 kg)   Physical Exam  Cardiovascular: Normal rate, regular rhythm and normal heart sounds.  Exam reveals no  gallop and no friction rub.   No murmur heard. Pulmonary/Chest: Effort normal and breath sounds normal. No respiratory distress. He has no wheezes. He has no rales.  Musculoskeletal:  2+ pitting edema in lower extremities bilaterally to the knee    Assessment & Plan:   See encounters tab for problem based medical decision making.   Patient discussed with Dr. Oswaldo Done

## 2017-02-16 NOTE — Progress Notes (Signed)
Internal Medicine Clinic Attending  Case discussed with Dr. Hoffman at the time of the visit.  We reviewed the resident's history and exam and pertinent patient test results.  I agree with the assessment, diagnosis, and plan of care documented in the resident's note.  

## 2017-02-16 NOTE — Assessment & Plan Note (Signed)
Assessment:  Chronic systolic heart failure Patient reports compliance with the increase in torsemide at this last PCP visit on 7/18. He took torsemide 80 mg twice a day for 3 days and returned to his home dose of 80 mg in the morning and 60 mg at night. Since last week patient has lost 3 pounds. No bibasilar crackles were noted on exam however he still has 2+ pitting edema in his legs up to his knees. Told patient to continue torsemide twice a day until he reached a goal weight of 330 pounds.  Told him to follow-up if his symptoms do not improve or worsen.  Plan -increase torsemide 80mg  BID until reaches goal weight of 330 pounds.  After weight goal is reached can return to home dose of 80mg  in the morning and 60mg  at night

## 2017-02-16 NOTE — Patient Instructions (Signed)
Eric Murillo,  It was a pleasure meeting you today. Please start taking torsemide 80 mg twice a day until you reach out weight goal of 330 pounds.

## 2017-02-22 ENCOUNTER — Ambulatory Visit (INDEPENDENT_AMBULATORY_CARE_PROVIDER_SITE_OTHER): Payer: Medicaid Other

## 2017-02-22 DIAGNOSIS — Z95810 Presence of automatic (implantable) cardiac defibrillator: Secondary | ICD-10-CM

## 2017-02-22 DIAGNOSIS — I5022 Chronic systolic (congestive) heart failure: Secondary | ICD-10-CM

## 2017-02-22 NOTE — Progress Notes (Signed)
EPIC Encounter for ICM Monitoring  Patient Name: Eric Murillo is a 56 y.o. male Date: 02/22/2017 Primary Care Physican: Darreld Mclean, MD Primary Cardiologist:[place] Electrophysiologist: Candie Echevaria Weight:335 lbs        Heart Failure questions reviewed, pt weight remains baseline of 330 lbs.  He reported leg swelling has improved.  PCP increased Furosemide to 80 mg bid x 3 day at last visit on 02/07/2017.   Patient had 2+ lower extremity edema at last HF visit 7/27 and was told to continue Furosemide 80 mg bid until he reaches goal weight of 330 lbs.    Thoracic impedance normal.  Prescribed dosage: Torsemide 20 mg 4 tablets (80 mg total) in am and 3 tablets (60 mg total) in pm. Metolazone 2.5 mg every Wednesday. Potassium 20 mEq 1 tablet (20 mEq total) by mouth 2 (two) times daily.  Spironolactone 25 mg 1 tablet daily.  Recommendations:  Patient is still taking Torsemide 20 mg 4 tablets (80 mg total) twice a day as directed at HF clinic on 02/16/2017 because weight is at 335 lbs.      Follow-up plan: ICM clinic phone appointment on 04/05/2017.  Office appointment scheduled 02/28/2017 with HF clinic.   Copy of ICM check sent to device physician.   3 month ICM trend: 02/22/2017   1 Year ICM trend:      Karie Soda, RN 02/22/2017 1:36 PM

## 2017-02-28 ENCOUNTER — Encounter (HOSPITAL_COMMUNITY): Payer: Medicaid Other

## 2017-03-01 ENCOUNTER — Ambulatory Visit: Payer: Medicaid Other | Attending: Internal Medicine

## 2017-03-12 ENCOUNTER — Emergency Department (HOSPITAL_COMMUNITY): Payer: Medicaid Other

## 2017-03-12 ENCOUNTER — Encounter (HOSPITAL_COMMUNITY): Payer: Self-pay | Admitting: Emergency Medicine

## 2017-03-12 DIAGNOSIS — I130 Hypertensive heart and chronic kidney disease with heart failure and stage 1 through stage 4 chronic kidney disease, or unspecified chronic kidney disease: Secondary | ICD-10-CM | POA: Diagnosis not present

## 2017-03-12 DIAGNOSIS — I5022 Chronic systolic (congestive) heart failure: Secondary | ICD-10-CM | POA: Diagnosis not present

## 2017-03-12 DIAGNOSIS — R509 Fever, unspecified: Secondary | ICD-10-CM | POA: Diagnosis present

## 2017-03-12 DIAGNOSIS — Z7901 Long term (current) use of anticoagulants: Secondary | ICD-10-CM | POA: Diagnosis not present

## 2017-03-12 DIAGNOSIS — J181 Lobar pneumonia, unspecified organism: Secondary | ICD-10-CM | POA: Diagnosis not present

## 2017-03-12 DIAGNOSIS — N183 Chronic kidney disease, stage 3 (moderate): Secondary | ICD-10-CM | POA: Insufficient documentation

## 2017-03-12 DIAGNOSIS — Z79899 Other long term (current) drug therapy: Secondary | ICD-10-CM | POA: Insufficient documentation

## 2017-03-12 LAB — URINALYSIS, ROUTINE W REFLEX MICROSCOPIC
BACTERIA UA: NONE SEEN
Bilirubin Urine: NEGATIVE
GLUCOSE, UA: NEGATIVE mg/dL
Hgb urine dipstick: NEGATIVE
KETONES UR: NEGATIVE mg/dL
Leukocytes, UA: NEGATIVE
NITRITE: NEGATIVE
PROTEIN: 30 mg/dL — AB
Specific Gravity, Urine: 1.013 (ref 1.005–1.030)
pH: 5 (ref 5.0–8.0)

## 2017-03-12 LAB — CBC WITH DIFFERENTIAL/PLATELET
BASOS PCT: 0 %
Basophils Absolute: 0 10*3/uL (ref 0.0–0.1)
EOS ABS: 0 10*3/uL (ref 0.0–0.7)
EOS PCT: 0 %
HCT: 41.1 % (ref 39.0–52.0)
Hemoglobin: 13.3 g/dL (ref 13.0–17.0)
LYMPHS ABS: 1.8 10*3/uL (ref 0.7–4.0)
Lymphocytes Relative: 19 %
MCH: 29.7 pg (ref 26.0–34.0)
MCHC: 32.4 g/dL (ref 30.0–36.0)
MCV: 91.7 fL (ref 78.0–100.0)
MONO ABS: 2.3 10*3/uL — AB (ref 0.1–1.0)
Monocytes Relative: 24 %
NEUTROS PCT: 57 %
Neutro Abs: 5.4 10*3/uL (ref 1.7–7.7)
PLATELETS: 174 10*3/uL (ref 150–400)
RBC: 4.48 MIL/uL (ref 4.22–5.81)
RDW: 14.8 % (ref 11.5–15.5)
WBC: 9.5 10*3/uL (ref 4.0–10.5)

## 2017-03-12 LAB — COMPREHENSIVE METABOLIC PANEL
ALBUMIN: 3.9 g/dL (ref 3.5–5.0)
ALK PHOS: 97 U/L (ref 38–126)
ALT: 24 U/L (ref 17–63)
ANION GAP: 12 (ref 5–15)
AST: 34 U/L (ref 15–41)
BILIRUBIN TOTAL: 1.3 mg/dL — AB (ref 0.3–1.2)
BUN: 43 mg/dL — ABNORMAL HIGH (ref 6–20)
CALCIUM: 9.3 mg/dL (ref 8.9–10.3)
CO2: 31 mmol/L (ref 22–32)
CREATININE: 3.66 mg/dL — AB (ref 0.61–1.24)
Chloride: 95 mmol/L — ABNORMAL LOW (ref 101–111)
GFR calc non Af Amer: 17 mL/min — ABNORMAL LOW (ref >60)
GFR, EST AFRICAN AMERICAN: 20 mL/min — AB (ref >60)
GLUCOSE: 108 mg/dL — AB (ref 65–99)
Potassium: 3.7 mmol/L (ref 3.5–5.1)
Sodium: 138 mmol/L (ref 135–145)
TOTAL PROTEIN: 7.7 g/dL (ref 6.5–8.1)

## 2017-03-12 LAB — I-STAT CG4 LACTIC ACID, ED: Lactic Acid, Venous: 1.16 mmol/L (ref 0.5–1.9)

## 2017-03-12 LAB — PROTIME-INR
INR: 1.54
PROTHROMBIN TIME: 18.7 s — AB (ref 11.4–15.2)

## 2017-03-12 MED ORDER — ACETAMINOPHEN 325 MG PO TABS
ORAL_TABLET | ORAL | Status: AC
  Filled 2017-03-12: qty 2

## 2017-03-12 MED ORDER — ACETAMINOPHEN 325 MG PO TABS
650.0000 mg | ORAL_TABLET | Freq: Once | ORAL | Status: AC
  Administered 2017-03-12: 650 mg via ORAL

## 2017-03-12 NOTE — ED Notes (Signed)
Pt reports HA, cough X2 days. Febrile in triage. Pt appears to be in NAD

## 2017-03-13 ENCOUNTER — Emergency Department (HOSPITAL_COMMUNITY)
Admission: EM | Admit: 2017-03-13 | Discharge: 2017-03-13 | Disposition: A | Discharge status: Home / Self Care | Payer: Medicaid Other | Attending: Emergency Medicine | Admitting: Emergency Medicine

## 2017-03-13 DIAGNOSIS — J189 Pneumonia, unspecified organism: Secondary | ICD-10-CM

## 2017-03-13 DIAGNOSIS — J181 Lobar pneumonia, unspecified organism: Secondary | ICD-10-CM

## 2017-03-13 MED ORDER — LEVOFLOXACIN 500 MG PO TABS
500.0000 mg | ORAL_TABLET | Freq: Once | ORAL | Status: AC
  Administered 2017-03-13: 500 mg via ORAL
  Filled 2017-03-13: qty 1

## 2017-03-13 MED ORDER — ACETAMINOPHEN 500 MG PO TABS
1000.0000 mg | ORAL_TABLET | Freq: Once | ORAL | Status: AC
  Administered 2017-03-13: 1000 mg via ORAL
  Filled 2017-03-13: qty 2

## 2017-03-13 MED ORDER — LEVOFLOXACIN 500 MG PO TABS: 500.0000 mg | ORAL_TABLET | Freq: Every day | ORAL | 0 refills | Status: DC

## 2017-03-13 NOTE — ED Provider Notes (Signed)
TIME SEEN: 4:11 AM  CHIEF COMPLAINT: Fever, cough  HPI: Patient is a 56 year old male with history of atrial flutter, hyperlipidemia, hypertension, chronic kidney disease, CHF with defibrillator who presents to the emergency department with complaints of fever, cough that started several days ago. Reports girlfriend with similar symptoms. He has no chest pain or shortness of breath. States he had a headache but has completely resolved as his fever has come down. No neck pain or neck stiffness. No tick bites. No rash. No international travel. No recent hospitalization. He is not on dialysis.  ROS: See HPI Constitutional:  fever  Eyes: no drainage  ENT: no runny nose   Cardiovascular:  no chest pain  Resp: no SOB  GI: no vomiting GU: no dysuria Integumentary: no rash  Allergy: no hives  Musculoskeletal: no leg swelling  Neurological: no slurred speech ROS otherwise negative  PAST MEDICAL HISTORY/PAST SURGICAL HISTORY:  Past Medical History:  Diagnosis Date  . AICD (automatic cardioverter/defibrillator) present   . Anemia of chronic disease   . Arthritis   . Atrial flutter (HCC)    DCCV 9/15  . Chest pain on exertion   . Chronic systolic congestive heart failure, NYHA class 2 (HCC)    EF 20-25% 1/16  . CKD (chronic kidney disease) stage 3, GFR 30-59 ml/min   . Dyslipidemia   . Gout    Of big toe  . Hyperglycemia   . Hyperlipidemia   . Hypertension   . Morbid obesity with BMI of 45.0-49.9, adult (HCC)   . Non-ischemic cardiomyopathy (HCC)   . Organic erectile dysfunction   . OSA (obstructive sleep apnea)   . Pilonidal cyst   . Submandibular sialolithiasis    Right    MEDICATIONS:  Prior to Admission medications   Medication Sig Start Date End Date Taking? Authorizing Provider  acetaminophen (TYLENOL) 500 MG tablet Take 2 tablets (1,000 mg total) by mouth every 8 (eight) hours as needed. 02/07/17 02/07/18  Darreld Mclean, MD  allopurinol (ZYLOPRIM) 100 MG tablet TAKE 1  TABLET BY MOUTH TWO TIMES DAILY 01/03/17   Darreld Mclean, MD  amiodarone (PACERONE) 200 MG tablet TAKE 1 TABLET (200 MG TOTAL) BY MOUTH DAILY. 11/10/16   Laurey Morale, MD  apixaban (ELIQUIS) 5 MG TABS tablet Take 1 tablet (5 mg total) by mouth 2 (two) times daily. 11/09/16   Darreld Mclean, MD  BIDIL 20-37.5 MG tablet TAKE 2 TABLETS BY MOUTH 3 TIMES A DAY 11/10/16   Laurey Morale, MD  carvedilol (COREG) 25 MG tablet Take 1 tablet (25 mg total) by mouth 2 (two) times daily with a meal. 11/22/16   Darreld Mclean, MD  colchicine 0.6 MG tablet Take 0.6 mg by mouth 2 (two) times daily as needed (gouty flare). Reported on 01/18/2016    [provider]  diclofenac sodium (VOLTAREN) 1 % GEL Apply 4 g topically 4 (four) times daily. 02/07/17   Darreld Mclean, MD  metolazone (ZAROXOLYN) 2.5 MG tablet Take 1 Tablet Every Wednesday. 12/29/16   Laurey Morale, MD  potassium chloride SA (K-DUR,KLOR-CON) 20 MEQ tablet Take 20 mEq by mouth 2 (two) times daily.    [provider]  spironolactone (ALDACTONE) 25 MG tablet TAKE 1 TABLET (25 MG TOTAL) BY MOUTH DAILY. 10/24/16   Bensimhon, Bevelyn Buckles, MD  torsemide (DEMADEX) 20 MG tablet TAKE 4 TABS IN AM AND 3 TABS IN PM 01/31/17   Laurey Morale, MD    ALLERGIES:  Allergies  Allergen Reactions  . Beta Adrenergic Blockers Other (See Comments)    REACTION: A white colored Beta Blocker made him feel funny.    SOCIAL HISTORY:  Social History  Substance Use Topics  . Smoking status: Never Smoker  . Smokeless tobacco: Never Used  . Alcohol use No    FAMILY HISTORY: Family History  Problem Relation Age of Onset  . Hypertension Mother   . Hypertension Father   . Cancer Father        brother died of brain cancer; sister died of bone cancer  . Diabetes Sister   . Diabetes Brother   . Colon cancer Maternal Aunt   . Cardiomyopathy Neg Hx     EXAM: BP 106/69 (BP Location: Left Arm)   Pulse 78   Temp 99.9 F (37.7 C) (Oral)   Resp 18   Ht 5'  9" (1.753 m)   Wt (!) 149.7 kg (330 lb)   SpO2 95%   BMI 48.73 kg/m  CONSTITUTIONAL: Alert and oriented and responds appropriately to questions. Well-appearing; well-nourished, Obese, nontoxic appearing, laughing HEAD: Normocephalic EYES: Conjunctivae clear, pupils appear equal, EOMI ENT: normal nose; moist mucous membranes NECK: Supple, no meningismus, no nuchal rigidity, no LAD  CARD: RRR; S1 and S2 appreciated; no murmurs, no clicks, no rubs, no gallops RESP: Normal chest excursion without splinting or tachypnea; breath sounds clear and equal bilaterally; no wheezes, no rhonchi, no rales, no hypoxia or respiratory distress, speaking full sentences ABD/GI: Normal bowel sounds; non-distended; soft, non-tender, no rebound, no guarding, no peritoneal signs, no hepatosplenomegaly BACK:  The back appears normal and is non-tender to palpation, there is no CVA tenderness EXT: Normal ROM in all joints; non-tender to palpation; no edema; normal capillary refill; no cyanosis, no calf tenderness or swelling    SKIN: Normal color for age and race; warm; no rash NEURO: Moves all extremities equally PSYCH: The patient's mood and manner are appropriate. Grooming and personal hygiene are appropriate.  MEDICAL DECISION MAKING: Patient here with mild right lower lobe pneumonia. Doubt meningitis. He has no meningismus on exam. No headache after his fever has come down. His labs show chronic kidney disease. His creatinine recently has been between 2.8 and 3.1. We have discussed that it is slightly elevated today at 3.66. He has an appointment for follow-up with his nephrologist Dr. Hyman Hopes soon. He is not taking any NSAIDs and we have recommended he continue to avoid these. I do not feel he needs IV hydration as he appears euvolemic at this time and has a history of an EF of 20%. He is not having chest pain or shortness of breath. Lactate is normal. No leukocytosis. Urine shows no sign of infection. I have offered  patient admission but he would prefer to be discharged home which I feel is reasonable as he does not appear septic. Blood cultures are pending. Discussed with pharmacist for renal dosing and pharmacist recommends Levaquin 500 mg once a day for 5 days. Patient has a primary care physician for follow-up and will see them this week. Recommended Tylenol at home for fever and pain. Discussed return precautions. Patient and family comfortable with this plan.  At this time, I do not feel there is any life-threatening condition present. I have reviewed and discussed all results (EKG, imaging, lab, urine as appropriate) and exam findings with patient/family. I have reviewed nursing notes and appropriate previous records.  I feel the patient is safe to be discharged home without further emergent workup and  can continue workup as an outpatient as needed. Discussed usual and customary return precautions. Patient/family verbalize understanding and are comfortable with this plan.  Outpatient follow-up has been provided if needed. All questions have been answered.      Ward, Layla Maw, DO 03/13/17 [id number]

## 2017-03-13 NOTE — Discharge Instructions (Signed)
You may take Tylenol 1000 mg every 6 hours as needed for pain and fever. Please avoid ibuprofen, aspirin and Aleve.   Please follow-up with your primary care physician in one week for recheck.

## 2017-03-17 LAB — CULTURE, BLOOD (ROUTINE X 2)
Culture: NO GROWTH
SPECIAL REQUESTS: ADEQUATE

## 2017-03-18 LAB — CULTURE, BLOOD (ROUTINE X 2)
Culture: NO GROWTH
Special Requests: ADEQUATE

## 2017-04-03 ENCOUNTER — Ambulatory Visit (HOSPITAL_COMMUNITY)
Admission: RE | Admit: 2017-04-03 | Discharge: 2017-04-03 | Disposition: A | Discharge status: Home / Self Care | Payer: Medicaid Other | Source: Ambulatory Visit | Attending: Cardiology | Admitting: Cardiology

## 2017-04-03 ENCOUNTER — Encounter (HOSPITAL_COMMUNITY): Payer: Self-pay

## 2017-04-03 VITALS — BP 146/98 | HR 97 | Wt 321.4 lb

## 2017-04-03 DIAGNOSIS — G4733 Obstructive sleep apnea (adult) (pediatric): Secondary | ICD-10-CM | POA: Insufficient documentation

## 2017-04-03 DIAGNOSIS — I4892 Unspecified atrial flutter: Secondary | ICD-10-CM | POA: Diagnosis not present

## 2017-04-03 DIAGNOSIS — Z9889 Other specified postprocedural states: Secondary | ICD-10-CM | POA: Insufficient documentation

## 2017-04-03 DIAGNOSIS — I5022 Chronic systolic (congestive) heart failure: Secondary | ICD-10-CM | POA: Diagnosis not present

## 2017-04-03 DIAGNOSIS — Z6841 Body Mass Index (BMI) 40.0 and over, adult: Secondary | ICD-10-CM | POA: Insufficient documentation

## 2017-04-03 DIAGNOSIS — E785 Hyperlipidemia, unspecified: Secondary | ICD-10-CM | POA: Diagnosis not present

## 2017-04-03 DIAGNOSIS — Z95810 Presence of automatic (implantable) cardiac defibrillator: Secondary | ICD-10-CM

## 2017-04-03 DIAGNOSIS — Z7901 Long term (current) use of anticoagulants: Secondary | ICD-10-CM | POA: Insufficient documentation

## 2017-04-03 DIAGNOSIS — I428 Other cardiomyopathies: Secondary | ICD-10-CM

## 2017-04-03 DIAGNOSIS — K115 Sialolithiasis: Secondary | ICD-10-CM | POA: Insufficient documentation

## 2017-04-03 DIAGNOSIS — M109 Gout, unspecified: Secondary | ICD-10-CM | POA: Diagnosis not present

## 2017-04-03 DIAGNOSIS — N183 Chronic kidney disease, stage 3 unspecified: Secondary | ICD-10-CM

## 2017-04-03 DIAGNOSIS — I130 Hypertensive heart and chronic kidney disease with heart failure and stage 1 through stage 4 chronic kidney disease, or unspecified chronic kidney disease: Secondary | ICD-10-CM | POA: Insufficient documentation

## 2017-04-03 DIAGNOSIS — I484 Atypical atrial flutter: Secondary | ICD-10-CM | POA: Diagnosis not present

## 2017-04-03 DIAGNOSIS — N529 Male erectile dysfunction, unspecified: Secondary | ICD-10-CM | POA: Insufficient documentation

## 2017-04-03 LAB — BASIC METABOLIC PANEL
Anion gap: 8 (ref 5–15)
BUN: 27 mg/dL — AB (ref 6–20)
CHLORIDE: 102 mmol/L (ref 101–111)
CO2: 32 mmol/L (ref 22–32)
CREATININE: 2.41 mg/dL — AB (ref 0.61–1.24)
Calcium: 9.5 mg/dL (ref 8.9–10.3)
GFR calc Af Amer: 33 mL/min — ABNORMAL LOW (ref >60)
GFR calc non Af Amer: 28 mL/min — ABNORMAL LOW (ref >60)
GLUCOSE: 99 mg/dL (ref 65–99)
Potassium: 3.6 mmol/L (ref 3.5–5.1)
SODIUM: 142 mmol/L (ref 135–145)
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
## 2017-04-03 NOTE — Patient Instructions (Signed)
Labs today We will only contact you if something comes back abnormal or we need to make some changes. Otherwise no news is good news!  Your physician recommends that you schedule a follow-up appointment in: 3 months  Do the following things EVERYDAY: 1) Weigh yourself in the morning before breakfast. Write it down and keep it in a log. 2) Take your medicines as prescribed 3) Eat low salt foods-Limit salt (sodium) to 2000 mg per day.  4) Stay as active as you can everyday 5) Limit all fluids for the day to less than 2 liters   

## 2017-04-05 ENCOUNTER — Ambulatory Visit (INDEPENDENT_AMBULATORY_CARE_PROVIDER_SITE_OTHER): Payer: Medicaid Other

## 2017-04-05 ENCOUNTER — Ambulatory Visit (INDEPENDENT_AMBULATORY_CARE_PROVIDER_SITE_OTHER): Payer: Medicaid Other | Admitting: *Deleted

## 2017-04-05 DIAGNOSIS — I5022 Chronic systolic (congestive) heart failure: Secondary | ICD-10-CM | POA: Diagnosis not present

## 2017-04-05 DIAGNOSIS — I428 Other cardiomyopathies: Secondary | ICD-10-CM

## 2017-04-05 DIAGNOSIS — Z95810 Presence of automatic (implantable) cardiac defibrillator: Secondary | ICD-10-CM

## 2017-04-05 NOTE — Progress Notes (Signed)
Remote ICD transmission.   

## 2017-04-06 ENCOUNTER — Encounter: Payer: Self-pay | Admitting: Cardiology

## 2017-04-06 NOTE — Progress Notes (Signed)
Attempted call back to patient and spoke with Angelique Blonder.  Requested patient call back and provided number.

## 2017-04-06 NOTE — Progress Notes (Signed)
EPIC Encounter for ICM Monitoring  Patient Name: Eric Murillo is a 56 y.o. male Date: 04/06/2017 Primary Care Physican: Zada Finders, MD Primary Cardiologist:[place] Electrophysiologist: Faustino Congress Weight:321 lbs       Heart Failure questions reviewed, pt symptomatic with shortness of breath and bilateral leg swelling.  Gained 10 pounds in last week.   Patient had pneumonia in the last 2 weeks but thinks it is resolved after taking antibiotics.  Advised patient to use ER if needed over the weekend.    Thoracic impedance abnormal suggesting fluid accumulation since 03/24/2017.  Patient reporting he is drinking too much fluid.   Prescribed dosage: Torsemide 20 mg 4 tablets (80 mg total) in AM and 3 tablets (60 mg total) in PM. Metolazone 2.5 mg every Wednesday (he reports taking 2 x week). Potassium 20 mEq 1 tablet (20 mEq total) by mouth 2 (two) times daily (takes extra with Metolazone). Spironolactone 25 mg 1 tablet daily.  Labs: 04/03/2017 Creatinine 2.41, BUN 27, Potassium 3.6, Sodium 142, EGFR 28-33 03/12/2017 Creatinine 3.66, BUN 43, Potassium 3.7, Sodium 138, EGFR 17-20 02/07/2017 Creatinine 2.80, BUN 33, Potassium 4.0, Sodium 143, EGFR 24-28 01/12/2017 Creatinine 2.92, BUN 43, Potassium 3.5, Sodium 140, EGFR 23-26 12/29/2016 Creatinine 2.84, BUN 39, Potassium 3.6, Sodium 139, EGFR 23-27 11/08/2016 Creatinine 2.77, BUN 31, Potassium 3.9, Sodium 142, EGFR 24-28 10/26/2016 Creatinine 3.07, BUN 37, Potassium 3.7, Sodium 142, EGFR 21-25 10/05/2016 Creatinine 2.42, BUN 29, Potassium 3.8, Sodium 141, EGFR 28-33 09/25/2016 Creatinine 2.78, BUN 36, Potassium 3.9, Sodium 141, EGFR 24-28  09/11/2016 Creatinine 2.78, BUN 29, Potassium 4.4, Sodium 143, EGFR 24-28  08/16/2016 Creatinine 2.81, BUN 40, Potassium 3.9, Sodium 147, EGFR 24-28  01/10/2018Creatinine 2.50, BUN 34, Potassium 4.7, Sodium 143, EGFR 28-32  06/06/2016 Creatinine 2.25, BUN 18, Potassium 4.0, Sodium 142, EGFR 31-36    05/09/2016 Creatinine 2.09, BUN 26, Potassium 4.2, Sodium 141, EGFR 34-39  Recommendations:  Patient has been advised by kidney physician to take Metolazone 2.5 mg twice a week and will take the 2nd weekly dose today.    Follow-up plan: ICM clinic phone appointment on 04/12/2017 to recheck fluid levels (manual send).    Copy of ICM check sent to Dr. Aundra Dubin and Dr. Caryl Comes for review and recommendations if needed.    Patient will be taking 2nd weekly Metolazone dosage today after this transmission was sent.    3 month ICM trend: 04/06/2017   1 Year ICM trend:      Rosalene Billings, RN 04/06/2017 8:52 AM

## 2017-04-06 NOTE — Progress Notes (Signed)
Call to patient.  Advised Dr Shirlee Latch recommended that he take 1 tablet of Metolazone 2.5 mg today and tomorrow as prescribed.  He agreed to [facility] on 04/10/2017.  Advised if he has an urgent needs over the weekend to use 911 or local ER if needed.

## 2017-04-06 NOTE — Progress Notes (Signed)
Take the metolazone 2 days in a row (today and tomorrow) then resume previous regimen.  Needs BMET Monday or Tuesday.

## 2017-04-10 ENCOUNTER — Ambulatory Visit (HOSPITAL_COMMUNITY)
Admission: RE | Admit: 2017-04-10 | Discharge: 2017-04-10 | Disposition: A | Discharge status: Home / Self Care | Payer: Medicaid Other | Source: Ambulatory Visit | Attending: Cardiology | Admitting: Cardiology

## 2017-04-10 ENCOUNTER — Other Ambulatory Visit (HOSPITAL_COMMUNITY): Payer: Self-pay | Admitting: Cardiology

## 2017-04-10 DIAGNOSIS — I5022 Chronic systolic (congestive) heart failure: Secondary | ICD-10-CM | POA: Insufficient documentation

## 2017-04-10 LAB — BASIC METABOLIC PANEL
Anion gap: 9 (ref 5–15)
BUN: 30 mg/dL — AB (ref 6–20)
CHLORIDE: 99 mmol/L — AB (ref 101–111)
CO2: 32 mmol/L (ref 22–32)
Calcium: 9 mg/dL (ref 8.9–10.3)
Creatinine, Ser: 2.71 mg/dL — ABNORMAL HIGH (ref 0.61–1.24)
GFR calc Af Amer: 29 mL/min — ABNORMAL LOW (ref >60)
GFR calc non Af Amer: 25 mL/min — ABNORMAL LOW (ref >60)
Glucose, Bld: 139 mg/dL — ABNORMAL HIGH (ref 65–99)
POTASSIUM: 3.5 mmol/L (ref 3.5–5.1)
Sodium: 140 mmol/L (ref 135–145)

## 2017-04-12 ENCOUNTER — Ambulatory Visit (INDEPENDENT_AMBULATORY_CARE_PROVIDER_SITE_OTHER): Payer: Self-pay

## 2017-04-12 DIAGNOSIS — I5022 Chronic systolic (congestive) heart failure: Secondary | ICD-10-CM

## 2017-04-12 DIAGNOSIS — Z95810 Presence of automatic (implantable) cardiac defibrillator: Secondary | ICD-10-CM

## 2017-04-12 NOTE — Progress Notes (Signed)
EPIC Encounter for ICM Monitoring  Patient Name: Eric Murillo is a 56 y.o. male Date: 04/12/2017 Primary Care Physican: Zada Finders, MD Primary Cardiologist:[place] Electrophysiologist: Faustino Congress Weight: 315 lbs      Heart Failure questions reviewed, pt had weight loss of 4-5 pounds   Thoracic impedance returned to normal after taking extra Metolazone as prescribed by Dr Aundra Dubin on 9/13.  Prescribed dosage: Torsemide 20 mg 4 tablets (80 mg total) in AM and 3 tablets (60 mg total) in PM. Metolazone 2.5 mg every Wednesday (he reports taking 2 x week). Potassium 20 mEq 1tablet (20 mEq total) by mouth 2 (two) times daily (takes extra with Metolazone). Spironolactone 25 mg 1 tablet daily.  Labs: 04/10/2017 Creatinine 2.71, BUN 30, Potassium 3.5, Sodium 140, EGFR 25-29 04/03/2017 Creatinine 2.41, BUN 27, Potassium 3.6, Sodium 142, EGFR 28-33 03/12/2017 Creatinine 3.66, BUN 43, Potassium 3.7, Sodium 138, EGFR 17-20 02/07/2017 Creatinine 2.80, BUN 33, Potassium 4.0, Sodium 143, EGFR 24-28 01/12/2017 Creatinine 2.92, BUN 43, Potassium 3.5, Sodium 140, EGFR 23-26 12/29/2016 Creatinine 2.84, BUN 39, Potassium 3.6, Sodium 139, EGFR 23-27 11/08/2016 Creatinine 2.77, BUN 31, Potassium 3.9, Sodium 142, EGFR 24-28 10/26/2016 Creatinine 3.07, BUN 37, Potassium 3.7, Sodium 142, EGFR 21-25 10/05/2016 Creatinine 2.42, BUN 29, Potassium 3.8, Sodium 141, EGFR 28-33 09/25/2016 Creatinine 2.78, BUN 36, Potassium 3.9, Sodium 141, EGFR 24-28  09/11/2016 Creatinine 2.78, BUN 29, Potassium 4.4, Sodium 143, EGFR 24-28  08/16/2016 Creatinine 2.81, BUN 40, Potassium 3.9, Sodium 147, EGFR 24-28  01/10/2018Creatinine 2.50, BUN 34, Potassium 4.7, Sodium 143, EGFR 28-32  06/06/2016 Creatinine 2.25, BUN 18, Potassium 4.0, Sodium 142, EGFR 31-36  05/09/2016 Creatinine 2.09, BUN 26, Potassium 4.2, Sodium 141,EGFR 34-39  Recommendations: No changes.   Encouraged to call for fluid symptoms.  Follow-up plan:  ICM clinic phone appointment on 05/14/2017.  Office appointment scheduled 07/03/2017 with Dr. Aundra Dubin.  Copy of ICM check sent to Dr. Caryl Comes.   3 month ICM trend: 04/12/2017   1 Year ICM trend:      Rosalene Billings, RN 04/12/2017 1:39 PM

## 2017-04-13 LAB — CUP PACEART REMOTE DEVICE CHECK
Battery Remaining Longevity: 85 mo
Battery Remaining Percentage: 84 %
HIGH POWER IMPEDANCE MEASURED VALUE: 75 Ohm
HighPow Impedance: 75 Ohm
Implantable Lead Model: 181
Implantable Lead Serial Number: 332395
Lead Channel Impedance Value: 400 Ohm
Lead Channel Sensing Intrinsic Amplitude: 12 mV
Lead Channel Setting Pacing Amplitude: 2.5 V
Lead Channel Setting Sensing Sensitivity: 0.5 mV
MDC IDC LEAD IMPLANT DT: 20161028
MDC IDC LEAD LOCATION: 753860
MDC IDC MSMT BATTERY VOLTAGE: 3.01 V
MDC IDC MSMT LEADCHNL RV PACING THRESHOLD AMPLITUDE: 0.75 V
MDC IDC MSMT LEADCHNL RV PACING THRESHOLD PULSEWIDTH: 0.5 ms
MDC IDC PG IMPLANT DT: 20161028
MDC IDC PG SERIAL: 7305918
MDC IDC SESS DTM: 20180920080023
MDC IDC SET LEADCHNL RV PACING PULSEWIDTH: 0.5 ms
MDC IDC STAT BRADY RV PERCENT PACED: 1 %

## 2017-05-14 ENCOUNTER — Ambulatory Visit (INDEPENDENT_AMBULATORY_CARE_PROVIDER_SITE_OTHER): Payer: Medicaid Other

## 2017-05-14 DIAGNOSIS — Z95810 Presence of automatic (implantable) cardiac defibrillator: Secondary | ICD-10-CM | POA: Diagnosis not present

## 2017-05-14 DIAGNOSIS — I5022 Chronic systolic (congestive) heart failure: Secondary | ICD-10-CM

## 2017-05-14 NOTE — Progress Notes (Signed)
EPIC Encounter for ICM Monitoring  Patient Name: Eric Murillo is a 56 y.o. male Date: 05/14/2017 Primary Care Physican: Patel, Vishal, MD Primary Cardiologist:[place] Electrophysiologist: Klein Dry Weight:  315lbs        Heart Failure questions reviewed, pt asymptomatic.   Thoracic impedance normal but was abnormal suggesting fluid accumulation from 04/24/2017 to 04/29/2017 and 05/05/2017 to 05/09/2017.  Prescribed dosage: Torsemide 20 mg 4 tablets (80 mg total) in AMand 3 tablets (60 mg total) in PM. Metolazone 2.5 mg every Wednesday (he reports taking 2 x week). Potassium 20 mEq 1tablet (20 mEq total) by mouth 2 (two) times daily (takes extra with Metolazone). Spironolactone 25 mg 1 tablet daily.  Labs: 04/10/2017 Creatinine 2.71, BUN 30, Potassium 3.5, Sodium 140, EGFR 25-29 04/03/2017 Creatinine 2.41, BUN 27, Potassium 3.6, Sodium 142, EGFR 28-33 03/12/2017 Creatinine 3.66, BUN 43, Potassium 3.7, Sodium 138, EGFR 17-20 02/07/2017 Creatinine 2.80, BUN 33, Potassium 4.0, Sodium 143, EGFR 24-28 01/12/2017 Creatinine 2.92, BUN 43, Potassium 3.5, Sodium 140, EGFR 23-26 12/29/2016 Creatinine 2.84, BUN 39, Potassium 3.6, Sodium 139, EGFR 23-27 11/08/2016 Creatinine 2.77, BUN 31, Potassium 3.9, Sodium 142, EGFR 24-28 10/26/2016 Creatinine 3.07, BUN 37, Potassium 3.7, Sodium 142, EGFR 21-25 10/05/2016 Creatinine 2.42, BUN 29, Potassium 3.8, Sodium 141, EGFR 28-33 09/25/2016 Creatinine 2.78, BUN 36, Potassium 3.9, Sodium 141, EGFR 24-28  09/11/2016 Creatinine 2.78, BUN 29, Potassium 4.4, Sodium 143, EGFR 24-28  08/16/2016 Creatinine 2.81, BUN 40, Potassium 3.9, Sodium 147, EGFR 24-28  01/10/2018Creatinine 2.50, BUN 34, Potassium 4.7, Sodium 143, EGFR 28-32  06/06/2016 Creatinine 2.25, BUN 18, Potassium 4.0, Sodium 142, EGFR 31-36  05/09/2016 Creatinine 2.09, BUN 26, Potassium 4.2, Sodium 141,EGFR 34-39  Recommendations: No changes.   Encouraged to call for fluid  symptoms.  Follow-up plan: ICM clinic phone appointment on 06/18/2017.  Office appointment scheduled 06/07/2017 with HF clinic.  Copy of ICM check sent to Dr. Klein.   3 month ICM trend: 05/14/2017   1 Year ICM trend:      Laurie S Short, RN 05/14/2017 9:17 AM    

## 2017-05-17 ENCOUNTER — Other Ambulatory Visit (HOSPITAL_COMMUNITY): Payer: Self-pay | Admitting: Cardiology

## 2017-05-18 ENCOUNTER — Other Ambulatory Visit (HOSPITAL_COMMUNITY): Payer: Self-pay | Admitting: *Deleted

## 2017-05-18 MED ORDER — TORSEMIDE 20 MG PO TABS: ORAL_TABLET | ORAL | 3 refills | Status: DC

## 2017-05-19 ENCOUNTER — Other Ambulatory Visit (HOSPITAL_COMMUNITY): Payer: Self-pay | Admitting: Cardiology

## 2017-05-19 ENCOUNTER — Other Ambulatory Visit: Payer: Self-pay | Admitting: Internal Medicine

## 2017-06-04 ENCOUNTER — Other Ambulatory Visit: Payer: Self-pay

## 2017-06-04 ENCOUNTER — Emergency Department (HOSPITAL_COMMUNITY): Payer: Medicaid Other

## 2017-06-04 ENCOUNTER — Encounter (HOSPITAL_COMMUNITY): Payer: Self-pay | Admitting: *Deleted

## 2017-06-04 DIAGNOSIS — I472 Ventricular tachycardia: Secondary | ICD-10-CM | POA: Diagnosis not present

## 2017-06-04 DIAGNOSIS — N184 Chronic kidney disease, stage 4 (severe): Secondary | ICD-10-CM | POA: Diagnosis present

## 2017-06-04 DIAGNOSIS — E876 Hypokalemia: Secondary | ICD-10-CM | POA: Diagnosis not present

## 2017-06-04 DIAGNOSIS — F40240 Claustrophobia: Secondary | ICD-10-CM | POA: Diagnosis present

## 2017-06-04 DIAGNOSIS — Z9119 Patient's noncompliance with other medical treatment and regimen: Secondary | ICD-10-CM

## 2017-06-04 DIAGNOSIS — Z886 Allergy status to analgesic agent status: Secondary | ICD-10-CM

## 2017-06-04 DIAGNOSIS — I441 Atrioventricular block, second degree: Secondary | ICD-10-CM | POA: Diagnosis not present

## 2017-06-04 DIAGNOSIS — Z7901 Long term (current) use of anticoagulants: Secondary | ICD-10-CM

## 2017-06-04 DIAGNOSIS — I130 Hypertensive heart and chronic kidney disease with heart failure and stage 1 through stage 4 chronic kidney disease, or unspecified chronic kidney disease: Principal | ICD-10-CM | POA: Diagnosis present

## 2017-06-04 DIAGNOSIS — I428 Other cardiomyopathies: Secondary | ICD-10-CM | POA: Diagnosis present

## 2017-06-04 DIAGNOSIS — I4892 Unspecified atrial flutter: Secondary | ICD-10-CM | POA: Diagnosis present

## 2017-06-04 DIAGNOSIS — Z6841 Body Mass Index (BMI) 40.0 and over, adult: Secondary | ICD-10-CM

## 2017-06-04 DIAGNOSIS — Z95810 Presence of automatic (implantable) cardiac defibrillator: Secondary | ICD-10-CM

## 2017-06-04 DIAGNOSIS — Z79899 Other long term (current) drug therapy: Secondary | ICD-10-CM

## 2017-06-04 DIAGNOSIS — Z8249 Family history of ischemic heart disease and other diseases of the circulatory system: Secondary | ICD-10-CM

## 2017-06-04 DIAGNOSIS — G4733 Obstructive sleep apnea (adult) (pediatric): Secondary | ICD-10-CM | POA: Diagnosis present

## 2017-06-04 DIAGNOSIS — Z888 Allergy status to other drugs, medicaments and biological substances status: Secondary | ICD-10-CM

## 2017-06-04 DIAGNOSIS — D638 Anemia in other chronic diseases classified elsewhere: Secondary | ICD-10-CM | POA: Diagnosis present

## 2017-06-04 DIAGNOSIS — I5043 Acute on chronic combined systolic (congestive) and diastolic (congestive) heart failure: Secondary | ICD-10-CM | POA: Diagnosis present

## 2017-06-04 DIAGNOSIS — N179 Acute kidney failure, unspecified: Secondary | ICD-10-CM | POA: Diagnosis present

## 2017-06-04 DIAGNOSIS — M1991 Primary osteoarthritis, unspecified site: Secondary | ICD-10-CM | POA: Diagnosis present

## 2017-06-04 LAB — BASIC METABOLIC PANEL
ANION GAP: 12 (ref 5–15)
BUN: 63 mg/dL — ABNORMAL HIGH (ref 6–20)
CHLORIDE: 99 mmol/L — AB (ref 101–111)
CO2: 29 mmol/L (ref 22–32)
CREATININE: 4.02 mg/dL — AB (ref 0.61–1.24)
Calcium: 8.8 mg/dL — ABNORMAL LOW (ref 8.9–10.3)
GFR calc non Af Amer: 15 mL/min — ABNORMAL LOW (ref >60)
GFR, EST AFRICAN AMERICAN: 18 mL/min — AB (ref >60)
Glucose, Bld: 114 mg/dL — ABNORMAL HIGH (ref 65–99)
POTASSIUM: 4.1 mmol/L (ref 3.5–5.1)
SODIUM: 140 mmol/L (ref 135–145)

## 2017-06-04 LAB — CBC
HCT: 40.2 % (ref 39.0–52.0)
HEMOGLOBIN: 12.2 g/dL — AB (ref 13.0–17.0)
MCH: 28.4 pg (ref 26.0–34.0)
MCHC: 30.3 g/dL (ref 30.0–36.0)
MCV: 93.5 fL (ref 78.0–100.0)
PLATELETS: 212 10*3/uL (ref 150–400)
RBC: 4.3 MIL/uL (ref 4.22–5.81)
RDW: 15.5 % (ref 11.5–15.5)
WBC: 12.4 10*3/uL — AB (ref 4.0–10.5)

## 2017-06-04 LAB — I-STAT TROPONIN, ED: Troponin i, poc: 0 ng/mL (ref 0.00–0.08)

## 2017-06-04 NOTE — ED Triage Notes (Signed)
Pt has been having CP for 2-3 days.  He states that is is a "funny feeling that comes over me and feels like when I had a stress test".  Pt states that it is "kind of a pressure, tightness, sob and dizziness which increases with any activity/exertion.  No pain at this moment at rest.

## 2017-06-05 ENCOUNTER — Inpatient Hospital Stay (HOSPITAL_COMMUNITY): Payer: Medicaid Other

## 2017-06-05 ENCOUNTER — Inpatient Hospital Stay (HOSPITAL_COMMUNITY)
Admission: EM | Admit: 2017-06-05 | Discharge: 2017-06-11 | DRG: 291 | Disposition: A | Discharge status: Home / Self Care | Payer: Medicaid Other | Attending: Internal Medicine | Admitting: Internal Medicine

## 2017-06-05 ENCOUNTER — Other Ambulatory Visit: Payer: Self-pay

## 2017-06-05 DIAGNOSIS — Z6841 Body Mass Index (BMI) 40.0 and over, adult: Secondary | ICD-10-CM

## 2017-06-05 DIAGNOSIS — I428 Other cardiomyopathies: Secondary | ICD-10-CM | POA: Diagnosis present

## 2017-06-05 DIAGNOSIS — M1991 Primary osteoarthritis, unspecified site: Secondary | ICD-10-CM | POA: Diagnosis present

## 2017-06-05 DIAGNOSIS — I340 Nonrheumatic mitral (valve) insufficiency: Secondary | ICD-10-CM | POA: Diagnosis not present

## 2017-06-05 DIAGNOSIS — G4733 Obstructive sleep apnea (adult) (pediatric): Secondary | ICD-10-CM | POA: Diagnosis present

## 2017-06-05 DIAGNOSIS — I130 Hypertensive heart and chronic kidney disease with heart failure and stage 1 through stage 4 chronic kidney disease, or unspecified chronic kidney disease: Principal | ICD-10-CM

## 2017-06-05 DIAGNOSIS — Z8249 Family history of ischemic heart disease and other diseases of the circulatory system: Secondary | ICD-10-CM

## 2017-06-05 DIAGNOSIS — Z95810 Presence of automatic (implantable) cardiac defibrillator: Secondary | ICD-10-CM | POA: Diagnosis not present

## 2017-06-05 DIAGNOSIS — I509 Heart failure, unspecified: Secondary | ICD-10-CM

## 2017-06-05 DIAGNOSIS — N184 Chronic kidney disease, stage 4 (severe): Secondary | ICD-10-CM | POA: Diagnosis present

## 2017-06-05 DIAGNOSIS — E876 Hypokalemia: Secondary | ICD-10-CM | POA: Diagnosis not present

## 2017-06-05 DIAGNOSIS — Z9119 Patient's noncompliance with other medical treatment and regimen: Secondary | ICD-10-CM

## 2017-06-05 DIAGNOSIS — Z8679 Personal history of other diseases of the circulatory system: Secondary | ICD-10-CM | POA: Diagnosis not present

## 2017-06-05 DIAGNOSIS — I5023 Acute on chronic systolic (congestive) heart failure: Secondary | ICD-10-CM | POA: Diagnosis not present

## 2017-06-05 DIAGNOSIS — I4892 Unspecified atrial flutter: Secondary | ICD-10-CM | POA: Diagnosis present

## 2017-06-05 DIAGNOSIS — I440 Atrioventricular block, first degree: Secondary | ICD-10-CM | POA: Diagnosis not present

## 2017-06-05 DIAGNOSIS — Z833 Family history of diabetes mellitus: Secondary | ICD-10-CM

## 2017-06-05 DIAGNOSIS — F40240 Claustrophobia: Secondary | ICD-10-CM | POA: Diagnosis present

## 2017-06-05 DIAGNOSIS — I5020 Unspecified systolic (congestive) heart failure: Secondary | ICD-10-CM | POA: Diagnosis not present

## 2017-06-05 DIAGNOSIS — D638 Anemia in other chronic diseases classified elsewhere: Secondary | ICD-10-CM | POA: Diagnosis present

## 2017-06-05 DIAGNOSIS — Z9114 Patient's other noncompliance with medication regimen: Secondary | ICD-10-CM | POA: Diagnosis not present

## 2017-06-05 DIAGNOSIS — Z886 Allergy status to analgesic agent status: Secondary | ICD-10-CM | POA: Diagnosis not present

## 2017-06-05 DIAGNOSIS — I5043 Acute on chronic combined systolic (congestive) and diastolic (congestive) heart failure: Secondary | ICD-10-CM | POA: Diagnosis present

## 2017-06-05 DIAGNOSIS — I484 Atypical atrial flutter: Secondary | ICD-10-CM

## 2017-06-05 DIAGNOSIS — Z888 Allergy status to other drugs, medicaments and biological substances status: Secondary | ICD-10-CM | POA: Diagnosis not present

## 2017-06-05 DIAGNOSIS — I472 Ventricular tachycardia: Secondary | ICD-10-CM | POA: Diagnosis not present

## 2017-06-05 DIAGNOSIS — Z7901 Long term (current) use of anticoagulants: Secondary | ICD-10-CM | POA: Diagnosis not present

## 2017-06-05 DIAGNOSIS — Z9889 Other specified postprocedural states: Secondary | ICD-10-CM | POA: Diagnosis not present

## 2017-06-05 DIAGNOSIS — N179 Acute kidney failure, unspecified: Secondary | ICD-10-CM

## 2017-06-05 DIAGNOSIS — E669 Obesity, unspecified: Secondary | ICD-10-CM | POA: Diagnosis not present

## 2017-06-05 DIAGNOSIS — E785 Hyperlipidemia, unspecified: Secondary | ICD-10-CM | POA: Diagnosis not present

## 2017-06-05 DIAGNOSIS — Z79899 Other long term (current) drug therapy: Secondary | ICD-10-CM | POA: Diagnosis not present

## 2017-06-05 DIAGNOSIS — I441 Atrioventricular block, second degree: Secondary | ICD-10-CM | POA: Diagnosis not present

## 2017-06-05 LAB — URINALYSIS, ROUTINE W REFLEX MICROSCOPIC
Bilirubin Urine: NEGATIVE
Glucose, UA: NEGATIVE mg/dL
HGB URINE DIPSTICK: NEGATIVE
KETONES UR: NEGATIVE mg/dL
LEUKOCYTES UA: NEGATIVE
NITRITE: NEGATIVE
Protein, ur: NEGATIVE mg/dL
Specific Gravity, Urine: 1.011 (ref 1.005–1.030)
pH: 5 (ref 5.0–8.0)

## 2017-06-05 LAB — BRAIN NATRIURETIC PEPTIDE: B Natriuretic Peptide: 672.6 pg/mL — ABNORMAL HIGH (ref 0.0–100.0)

## 2017-06-05 LAB — ECHOCARDIOGRAM COMPLETE: Weight: 5264 oz

## 2017-06-05 LAB — BASIC METABOLIC PANEL
ANION GAP: 11 (ref 5–15)
BUN: 62 mg/dL — ABNORMAL HIGH (ref 6–20)
CALCIUM: 9 mg/dL (ref 8.9–10.3)
CO2: 29 mmol/L (ref 22–32)
CREATININE: 3.57 mg/dL — AB (ref 0.61–1.24)
Chloride: 100 mmol/L — ABNORMAL LOW (ref 101–111)
GFR, EST AFRICAN AMERICAN: 20 mL/min — AB (ref >60)
GFR, EST NON AFRICAN AMERICAN: 18 mL/min — AB (ref >60)
Glucose, Bld: 133 mg/dL — ABNORMAL HIGH (ref 65–99)
Potassium: 3.7 mmol/L (ref 3.5–5.1)
SODIUM: 140 mmol/L (ref 135–145)

## 2017-06-05 LAB — I-STAT TROPONIN, ED: Troponin i, poc: 0.02 ng/mL (ref 0.00–0.08)

## 2017-06-05 LAB — HIV ANTIBODY (ROUTINE TESTING W REFLEX): HIV SCREEN 4TH GENERATION: NONREACTIVE

## 2017-06-05 MED ORDER — ACETAMINOPHEN 325 MG PO TABS: 650.0000 mg | ORAL_TABLET | Freq: Four times a day (QID) | ORAL | Status: DC | PRN

## 2017-06-05 MED ORDER — HEPARIN (PORCINE) IN NACL 100-0.45 UNIT/ML-% IJ SOLN
1600.0000 [IU]/h | INTRAMUSCULAR | Status: DC
  Filled 2017-06-05: qty 250

## 2017-06-05 MED ORDER — HEPARIN SODIUM (PORCINE) 5000 UNIT/ML IJ SOLN: 5000.0000 [IU] | Freq: Three times a day (TID) | INTRAMUSCULAR | Status: DC

## 2017-06-05 MED ORDER — CARVEDILOL 12.5 MG PO TABS
12.5000 mg | ORAL_TABLET | Freq: Two times a day (BID) | ORAL | Status: DC
  Filled 2017-06-05: qty 1

## 2017-06-05 MED ORDER — APIXABAN 5 MG PO TABS
5.0000 mg | ORAL_TABLET | Freq: Two times a day (BID) | ORAL | Status: DC
  Administered 2017-06-05 – 2017-06-11 (×13): 5 mg via ORAL
  Filled 2017-06-05 (×14): qty 1

## 2017-06-05 MED ORDER — APIXABAN 2.5 MG PO TABS: 2.5000 mg | ORAL_TABLET | Freq: Two times a day (BID) | ORAL | Status: DC

## 2017-06-05 MED ORDER — HEPARIN BOLUS VIA INFUSION
5300.0000 [IU] | Freq: Once | INTRAVENOUS | Status: DC
  Filled 2017-06-05: qty 5300

## 2017-06-05 MED ORDER — SODIUM CHLORIDE 0.9% FLUSH
3.0000 mL | Freq: Two times a day (BID) | INTRAVENOUS | Status: DC
  Administered 2017-06-05 – 2017-06-11 (×12): 3 mL via INTRAVENOUS

## 2017-06-05 MED ORDER — SODIUM CHLORIDE 0.9% FLUSH: 3.0000 mL | INTRAVENOUS | Status: DC | PRN

## 2017-06-05 MED ORDER — FUROSEMIDE 10 MG/ML IJ SOLN
120.0000 mg | Freq: Two times a day (BID) | INTRAVENOUS | Status: DC
  Filled 2017-06-05: qty 12

## 2017-06-05 MED ORDER — ACETAMINOPHEN 650 MG RE SUPP: 650.0000 mg | Freq: Four times a day (QID) | RECTAL | Status: DC | PRN

## 2017-06-05 MED ORDER — APIXABAN 5 MG PO TABS: 5.0000 mg | ORAL_TABLET | Freq: Two times a day (BID) | ORAL | Status: DC

## 2017-06-05 MED ORDER — AMIODARONE HCL 200 MG PO TABS
200.0000 mg | ORAL_TABLET | Freq: Every day | ORAL | Status: DC
  Administered 2017-06-05: 200 mg via ORAL
  Filled 2017-06-05: qty 1

## 2017-06-05 MED ORDER — SODIUM CHLORIDE 0.9 % IV SOLN
250.0000 mL | INTRAVENOUS | Status: DC
  Administered 2017-06-05: 250 mL via INTRAVENOUS
  Administered 2017-06-06: 12:00:00 via INTRAVENOUS

## 2017-06-05 MED ORDER — CARVEDILOL 25 MG PO TABS
25.0000 mg | ORAL_TABLET | Freq: Two times a day (BID) | ORAL | Status: DC
  Administered 2017-06-05: 25 mg via ORAL
  Filled 2017-06-05: qty 2

## 2017-06-05 MED ORDER — SENNOSIDES-DOCUSATE SODIUM 8.6-50 MG PO TABS
1.0000 | ORAL_TABLET | Freq: Every evening | ORAL | Status: DC | PRN
  Administered 2017-06-09: 1 via ORAL
  Filled 2017-06-05: qty 1

## 2017-06-05 MED ORDER — CARVEDILOL 6.25 MG PO TABS
6.2500 mg | ORAL_TABLET | Freq: Two times a day (BID) | ORAL | Status: DC
  Administered 2017-06-06: 6.25 mg via ORAL
  Filled 2017-06-05: qty 1

## 2017-06-05 MED ORDER — FUROSEMIDE 10 MG/ML IJ SOLN
120.0000 mg | Freq: Two times a day (BID) | INTRAVENOUS | Status: DC
  Administered 2017-06-05 – 2017-06-10 (×12): 120 mg via INTRAVENOUS
  Filled 2017-06-05: qty 12
  Filled 2017-06-05: qty 10
  Filled 2017-06-05 (×3): qty 2
  Filled 2017-06-05: qty 10
  Filled 2017-06-05 (×2): qty 12
  Filled 2017-06-05 (×3): qty 10
  Filled 2017-06-05 (×3): qty 12
  Filled 2017-06-05: qty 10

## 2017-06-05 MED ORDER — SPIRONOLACTONE 25 MG PO TABS
25.0000 mg | ORAL_TABLET | Freq: Every day | ORAL | Status: DC
  Filled 2017-06-05: qty 1

## 2017-06-05 MED ORDER — ALLOPURINOL 100 MG PO TABS
100.0000 mg | ORAL_TABLET | Freq: Two times a day (BID) | ORAL | Status: DC
  Administered 2017-06-05 – 2017-06-11 (×13): 100 mg via ORAL
  Filled 2017-06-05 (×14): qty 1

## 2017-06-05 NOTE — Progress Notes (Signed)
   Subjective: No acute events following admission. States he is starting to make urine after receiving IV diuretic. He has tried to watch his salt intake but has not been fluid restricting, and also missed last metolazone dose.  During evaluation, tele monitor with tachycardia to 120s-140s, difficult to interpret rhythm on monitor. Pt denies sx of palpitations. Repeat EKG requested   Objective:  Vital signs in last 24 hours: Vitals:   06/05/17 0800 06/05/17 0830 06/05/17 1031 06/05/17 1035  BP: 118/88 112/73 (!) 93/59   Pulse: 67 65 63   Resp: (!) 22 (!) 26 (!) 22   Temp:      TempSrc:      SpO2: 98% 92% 91% 92%  Weight:       General: Obese male, sitting in bed, no acute distress CV: AICD L chest wall, no murmur, tachycardic on monitor though sounds normal rate with auscultation  Resp: Faint basilar crackles, normal work of breathing, no distress  Abd: Soft, +BS, obese with edema  Extr: Pitting edema to bilateral LEs  Neuro: Alert and oriented x3 Skin: Warm, dry, acrochordons to neck       Assessment/Plan:  HFrEF exacerbation (EF 25-30%, s/p ICD) Patient presented with progressive chest tightness and dyspnea on exertion with increased swelling and edema on chest x-ray in the setting of nonadherence with fluid restriction missed metolazone dose.  Estimated dry weight per patient around 320 pounds, currently 329.  He was started on IV diuresis. Cardiology consulted, appreciate recommendations. --Cont IV Lasix 120 mg BID --Cont Carvedilol 25 mg BID --Strict I/Os, daily weights, fluid and salt restriction  Acute on Chronic Kidney Injury (CKD IV)  Patient has a history of chronic kidney disease stage IV with baseline creatinine around 2.7-2.9.  Creatinine on admission elevated to 4.0.  Renal ultrasound on admission without obstruction.  Likely due to volume overload and expect improvement with diuresis. --BMP, replete electrolytes prn --Hold home Eliquis   H/o Atrial Flutter s/p  cardioversion (2017) Patient has a history of atrial flutter and cardioversion performed in 2017.  He is currently on amiodarone and Eliquis at home.  During rounds, telemetry noted tachycardia to 120s-140s.  Patient denies symptoms of palpitations at the time.  If he is intermittently in arrhythmia, this may be contributing to his heart failure exacerbation. --Rpt EKG  --Cont home Amiodarone 200 mg daily  --Hold home Eliquis, Heparin gtt per pharm   H/o Hypertension Patient normotensive on admission and holding home BiDil.  Continuing home carvedilol and spironolactone.   Dispo: Anticipated discharge in approximately 1-2 day(s).   Ginger Carne, MD 06/05/2017, 11:01 AM Pager: [phone]

## 2017-06-05 NOTE — ED Provider Notes (Signed)
Patient seen/examined in the Emergency Department in conjunction with Midlevel Provider [facility] Patient reports dyspnea and chest pain.  H/o CHF Exam : awake/alert, no distress, obesity noted, decreased BS noted bilaterally Plan: will likely need admit for CHF exacerbation    Zadie Rhine, MD 06/05/17 [phone]

## 2017-06-05 NOTE — ED Notes (Signed)
Paged Admitting about pt's BP

## 2017-06-05 NOTE — ED Notes (Signed)
Transported to u/s

## 2017-06-05 NOTE — Evaluation (Signed)
Physical Therapy Evaluation Patient Details Name: Eric KelchJasper Murillo MRN: [id number] DOB: 07-10-1961 Today's Date: 06/05/2017   History of Present Illness  Pt is a 56 y/o male admitted secondary to chest pain and SOB. Per MD notes, suspect CHF exacerbation. PMH includes OSA, HTN, CHF, CKD, a flutter, gout, non ischemic cardiomyopathy, and AICD  Clinical Impression  Pt admitted secondary to problem above with deficits below. PTA, pt was independent with functional mobility. Upon eval, pt presenting with slightly decreased balance and chest tightness with mobility. Very limited distance within the room secondary to chest tightness, VSS. Notified RN about chest tightness. Required min guard to supervision for mobility. Anticipate pt will progress well once cardiopulmonary symptoms resolved. Will have assist from girlfriend and mother at d/c. Will continue to follow acutely to progress mobility and to ensure safety and independence with functional mobility.     Follow Up Recommendations No PT follow up;Supervision for mobility/OOB    Equipment Recommendations  None recommended by PT    Recommendations for Other Services       Precautions / Restrictions Precautions Precautions: None Restrictions Weight Bearing Restrictions: No      Mobility  Bed Mobility               General bed mobility comments: Sitting EOB upon entry   Transfers Overall transfer level: Needs assistance Equipment used: None Transfers: Sit to/from Stand Sit to Stand: Supervision         General transfer comment: Supervision for safety   Ambulation/Gait Ambulation/Gait assistance: Min guard Ambulation Distance (Feet): 5 Feet Assistive device: None Gait Pattern/deviations: Step-through pattern;Decreased stride length;Wide base of support Gait velocity: Decreased Gait velocity interpretation: Below normal speed for age/gender General Gait Details: Slow, cautious gait. Limited distance as pt began to feel  slight chest tightness. VSS. Slightly unsteady, however, no overt LOB noted.   Stairs            Wheelchair Mobility    Modified Rankin (Stroke Patients Only)       Balance Overall balance assessment: Needs assistance Sitting-balance support: No upper extremity supported;Feet supported Sitting balance-Leahy Scale: Good     Standing balance support: No upper extremity supported;During functional activity Standing balance-Leahy Scale: Fair               High level balance activites: Backward walking High Level Balance Comments: backward walking to bed. Slightly unsteady, however, no LOB noted.              Pertinent Vitals/Pain Pain Assessment: Faces Faces Pain Scale: Hurts a little bit Pain Location: chest  Pain Descriptors / Indicators: Tightness Pain Intervention(s): Limited activity within patient's tolerance;Monitored during session;Repositioned    Home Living Family/patient expects to be discharged to:: Private residence Living Arrangements: Parent;Spouse/significant other Available Help at Discharge: Family;Available 24 hours/day Type of Home: House Home Access: Level entry     Home Layout: One level Home Equipment: Bedside commode      Prior Function Level of Independence: Independent               Hand Dominance   Dominant Hand: Right    Extremity/Trunk Assessment   Upper Extremity Assessment Upper Extremity Assessment: Defer to OT evaluation    Lower Extremity Assessment Lower Extremity Assessment: Overall WFL for tasks assessed    Cervical / Trunk Assessment Cervical / Trunk Assessment: Normal  Communication   Communication: No difficulties  Cognition Arousal/Alertness: Awake/alert Behavior During Therapy: WFL for tasks assessed/performed Overall Cognitive Status: Within Functional  Limits for tasks assessed                                        General Comments General comments (skin integrity,  edema, etc.): Pt's girlfriend present during session     Exercises     Assessment/Plan    PT Assessment Patient needs continued PT services  PT Problem List Decreased balance;Decreased mobility;Decreased knowledge of precautions;Cardiopulmonary status limiting activity       PT Treatment Interventions Gait training;Functional mobility training;Therapeutic activities;Therapeutic exercise;Balance training;Neuromuscular re-education;Patient/family education    PT Goals (Current goals can be found in the Care Plan section)  Acute Rehab PT Goals Patient Stated Goal: to feel better  PT Goal Formulation: With patient Time For Goal Achievement: 06/12/17 Potential to Achieve Goals: Good    Frequency Min 3X/week   Barriers to discharge        Co-evaluation               AM-PAC PT "6 Clicks" Daily Activity  Outcome Measure Difficulty turning over in bed (including adjusting bedclothes, sheets and blankets)?: A Little Difficulty moving from lying on back to sitting on the side of the bed? : A Little Difficulty sitting down on and standing up from a chair with arms (e.g., wheelchair, bedside commode, etc,.)?: None Help needed moving to and from a bed to chair (including a wheelchair)?: A Little Help needed walking in hospital room?: A Little Help needed climbing 3-5 steps with a railing? : A Little 6 Click Score: 19    End of Session Equipment Utilized During Treatment: Gait belt Activity Tolerance: Treatment limited secondary to medical complications (Comment)(chest tightness, notified RN ) Patient left: in bed;with call bell/phone within reach(sitting EOB ) Nurse Communication: Mobility status;Other (comment)(increased chest tightness ) PT Visit Diagnosis: Other abnormalities of gait and mobility (R26.89);Unsteadiness on feet (R26.81)    Time: 5830-9407 PT Time Calculation (min) (ACUTE ONLY): 17 min   Charges:   PT Evaluation $PT Eval Moderate Complexity: 1 Mod      PT G Codes:        Gladys Damme, PT, DPT  Acute Rehabilitation Services  Pager: [phone]   Lehman Prom 06/05/2017, 12:18 PM

## 2017-06-05 NOTE — ED Notes (Signed)
Attempted Report x1.   

## 2017-06-05 NOTE — Consult Note (Signed)
Advanced Heart Failure Team Consult Note   Primary Physician:  Dr Allena Katz Primary Cardiologist:  Dr Shirlee Latch   Reason for Consult:  A/C Systolic Heart Failure    HPI:    The Heart Failure Team was asked to consult by Dr Earlie Raveling for heart failure.   Eric Murillo is a 56 year old with history of chronic systolic heart failure, NICM, PAF TEE DC-CV 2015 and 2017, OSA, hyperlipidemia, obesity, gout, and CKD Stage III admitted with a/c systolic heart failure. Of note last EKG in [facility] was 12/2016.    Followed closely in the HF clinic and was last see 04/03/2017. He was stable with no HF med changes.  Presented to [facility] ED with chest pain and shortness of breath. He developed increased dyspnea with exertion and dizziness over the last 3-4 days. Says he did not take his weekly dose of metolazone because he didn't think he needed it. Weight at home has been 325-329 pounds. He did miss pm dose of eliquis. In the ED he received 120 mg IV lasix earlier this morning.  CXR with vascular congestion. Pertinent  admission labs include; troponin 0.0>0.02, K 4.1, creatinine 4.02, BNP 672, sodium 140, wbc 12.4, and hgb 12.2.    Currently denies SOB at rest. Complaining of fatigue and edema. Denies palpitations, dizziness or presyncope.   EKG 12/2016 SR   Review of Systems: [y] = yes, [ ]  = no   General: Weight gain [ ] ; Weight loss [ ] ; Anorexia [ ] ; Fatigue [Y ]; Fever [ ] ; Chills [ ] ; Weakness [ Y]  Cardiac: Chest pain/pressure [ ] ; Resting SOB [Y ]; Exertional SOB [Y ]; Orthopnea [ ] ; Pedal Edema [Y ]; Palpitations [ ] ; Syncope [ ] ; Presyncope [ ] ; Paroxysmal nocturnal dyspnea[ ]   Pulmonary: Cough [ ] ; Wheezing[ ] ; Hemoptysis[ ] ; Sputum [ ] ; Snoring [ ]   GI: Vomiting[ ] ; Dysphagia[ ] ; Melena[ ] ; Hematochezia [ ] ; Heartburn[ ] ; Abdominal pain [ ] ; Constipation [ ] ; Diarrhea [ ] ; BRBPR [ ]   GU: Hematuria[ ] ; Dysuria [ ] ; Nocturia[ ]   Vascular: Pain in legs with walking [ ] ; Pain in feet with lying flat [ ] ;  Non-healing sores [ ] ; Stroke [ ] ; TIA [ ] ; Slurred speech [ ] ;  Neuro: Headaches[ ] ; Vertigo[ ] ; Seizures[ ] ; Paresthesias[ ] ;Blurred vision [ ] ; Diplopia [ ] ; Vision changes [ ]   Ortho/Skin: Arthritis [ y]; Joint pain [Y ]; Muscle pain [ ] ; Joint swelling [ ] ; Back Pain [ ] ; Rash [ ]   Psych: Depression[ ] ; Anxiety[ ]   Heme: Bleeding problems [ ] ; Clotting disorders [ ] ; Anemia [ ]   Endocrine: Diabetes [ ] ; Thyroid dysfunction[ ]    Home Medications Prior to Admission medications   Medication Sig Start Date End Date Taking? Authorizing Provider  allopurinol (ZYLOPRIM) 100 MG tablet TAKE 1 TABLET BY MOUTH TWO TIMES DAILY Patient taking differently: Take 100 mg by mouth two times a day 05/21/17  Yes Darreld Mclean, MD  amiodarone (PACERONE) 200 MG tablet TAKE 1 TABLET (200 MG TOTAL) BY MOUTH DAILY. 05/22/17  Yes Laurey Morale, MD  apixaban (ELIQUIS) 5 MG TABS tablet Take 1 tablet (5 mg total) by mouth 2 (two) times daily. 11/09/16  Yes Darreld Mclean, MD  BIDIL 20-37.5 MG tablet TAKE 2 TABLETS BY MOUTH 3 TIMES A DAY Patient taking differently: Take 2 tablets by mouth three times a day 05/22/17  Yes Laurey Morale, MD  carvedilol (COREG) 25 MG tablet Take 1 tablet (25 mg  total) by mouth 2 (two) times daily with a meal. 11/22/16  Yes Darreld McleanPatel, Vishal, MD  colchicine 0.6 MG tablet Take 0.6 mg 2 (two) times daily as needed by mouth (FOR GOUT FLARES). Reported on 01/18/2016   Yes [provider]  diclofenac sodium (VOLTAREN) 1 % GEL Apply 4 g topically 4 (four) times daily. Patient taking differently: Apply 4 g 4 (four) times daily as needed topically (AS DIRECTED FOR PAIN).  02/07/17  Yes Darreld McleanPatel, Vishal, MD  KLOR-CON M20 20 MEQ tablet TAKE 1 TABLET (20 MEQ TOTAL) BY MOUTH 2 (TWO) TIMES DAILY. 04/12/17  Yes Bensimhon, Bevelyn Bucklesaniel R, MD  metolazone (ZAROXOLYN) 2.5 MG tablet TAKE 1 TABLET (2.5 MG TOTAL) BY MOUTH ONCE DAILY Patient taking differently: Take 2.5 mg by mouth every Wednesday 05/22/17  Yes  Laurey MoraleMcLean, Dalton S, MD  spironolactone (ALDACTONE) 25 MG tablet TAKE 1 TABLET (25 MG TOTAL) BY MOUTH DAILY. Patient taking differently: Take 25 mg by mouth once a day 10/24/16  Yes Bensimhon, Bevelyn Bucklesaniel R, MD  torsemide (DEMADEX) 20 MG tablet TAKE 4 TABLETS BY MOUTH EVERY MORNING AND 3 TABLETS EVERY EVENING Patient taking differently: Take 4 tablets (80 mg) by mouth in the morning and 3 tablets (60 mg) in the evening 05/18/17  Yes Bensimhon, Bevelyn Bucklesaniel R, MD  acetaminophen (TYLENOL) 500 MG tablet Take 2 tablets (1,000 mg total) by mouth every 8 (eight) hours as needed. Patient taking differently: Take 1,000 mg every 8 (eight) hours as needed by mouth (for pain or headaches).  02/07/17 02/07/18  Darreld McleanPatel, Vishal, MD  levofloxacin (LEVAQUIN) 500 MG tablet Take 1 tablet (500 mg total) by mouth daily. Patient not taking: Reported on 06/04/2017 03/13/17   Ward, Layla MawKristen N, DO  metolazone (ZAROXOLYN) 2.5 MG tablet Take 1 Tablet Every Wednesday. Patient not taking: Reported on 06/04/2017 12/29/16   Laurey MoraleMcLean, Dalton S, MD  torsemide (DEMADEX) 20 MG tablet TAKE 4 TABS IN AM AND 3 TABS IN PM Patient not taking: Reported on 06/04/2017 05/18/17   Laurey MoraleMcLean, Dalton S, MD    Past Medical History: Past Medical History:  Diagnosis Date  . AICD (automatic cardioverter/defibrillator) present   . Anemia of chronic disease   . Arthritis   . Atrial flutter (HCC)    DCCV 9/15  . Chest pain on exertion   . Chronic systolic congestive heart failure, NYHA class 2 (HCC)    EF 20-25% 1/16  . CKD (chronic kidney disease) stage 3, GFR 30-59 ml/min (HCC)   . Dyslipidemia   . Gout    Of big toe  . Hyperglycemia   . Hyperlipidemia   . Hypertension   . Morbid obesity with BMI of 45.0-49.9, adult (HCC)   . Non-ischemic cardiomyopathy (HCC)   . Organic erectile dysfunction   . OSA (obstructive sleep apnea)   . Pilonidal cyst   . Submandibular sialolithiasis    Right    Past Surgical History: Past Surgical History:  Procedure  Laterality Date  . CARDIAC CATHETERIZATION  2012  . COLONOSCOPY    . SALIVARY GLAND SURGERY  09/12/2012    Family History:  Family History  Problem Relation Age of Onset  . Hypertension Mother   . Hypertension Father   . Cancer Father        brother died of brain cancer; sister died of bone cancer  . Diabetes Sister   . Diabetes Brother   . Colon cancer Maternal Aunt   . Cardiomyopathy Neg Hx     Social History: Social History  Socioeconomic History  . Marital status: Single    Spouse name: None  . Number of children: None  . Years of education: None  . Highest education level: None  Social Needs  . Financial resource strain: None  . Food insecurity - worry: None  . Food insecurity - inability: None  . Transportation needs - medical: None  . Transportation needs - non-medical: None  Occupational History  . None  Tobacco Use  . Smoking status: Never Smoker  . Smokeless tobacco: Never Used  Substance and Sexual Activity  . Alcohol use: No    Alcohol/week: 0.0 oz  . Drug use: No  . Sexual activity: None  Other Topics Concern  . None  Social History Narrative   Financial assistance approved for 100% discount at [facility] and has [facility] card   [employer]  March 16, 2010 9:42 AM      Lives with mother.  Has a girlfriend    Allergies:  Allergies  Allergen Reactions  . Nsaids Other (See Comments)    Cannot take to due kidney issues  . Beta Adrenergic Blockers Other (See Comments)    "An unnamed, white-colored Beta Blocker made" him "feel funny" = made him feel cagey    Objective:    Vital Signs:   Temp:  [98 F (36.7 C)] 98 F (36.7 C) (11/12 2132) Pulse Rate:  [33-76] 63 (11/13 1031) Resp:  [15-35] 22 (11/13 1031) BP: (93-134)/(59-90) 93/59 (11/13 1031) SpO2:  [91 %-100 %] 92 % (11/13 1035) FiO2 (%):  [0 %] 0 % (11/13 0456) Weight:  [325 lb (147.4 kg)-329 lb (149.2 kg)] 329 lb (149.2 kg) (11/13 0406)   Filed Weights   06/04/17 2132 06/05/17 0406    Weight: (!) 325 lb (147.4 kg) (!) 329 lb (149.2 kg)     Physical Exam     General:  Obese male. Sitting on the side of the bed. NAD HEENT: Normal Neck: Supple. JVP to jaw. Carotids 2+ bilat; no bruits. No lymphadenopathy or thyromegaly appreciated. Cor: PMI laterally ndisplaced. Irregular rate & rhythm. No rubs, gallops or murmurs. Lungs: decreased in the bases on room air.  Abdomen: obese, soft, nontender, nondistended. No hepatosplenomegaly. No bruits or masses. Good bowel sounds. Extremities: No cyanosis, clubbing, rash, R and LLE cool. R and LLE 1+ edema Neuro: Alert & oriented x 3, cranial nerves grossly intact. moves all 4 extremities w/o difficulty. Affect pleasant.   Telemetry   A fib/flutter 50s Personally reviewed   EKG   Atypical A flutter 58 bpm  Personally reviewed   Labs     Basic Metabolic Panel: Recent Labs  Lab 06/04/17 2132  NA 140  K 4.1  CL 99*  CO2 29  GLUCOSE 114*  BUN 63*  CREATININE 4.02*  CALCIUM 8.8*    Liver Function Tests: No results for input(s): AST, ALT, ALKPHOS, BILITOT, PROT, ALBUMIN in the last 168 hours. No results for input(s): LIPASE, AMYLASE in the last 168 hours. No results for input(s): AMMONIA in the last 168 hours.  CBC: Recent Labs  Lab 06/04/17 2132  WBC 12.4*  HGB 12.2*  HCT 40.2  MCV 93.5  PLT 212    Cardiac Enzymes: No results for input(s): CKTOTAL, CKMB, CKMBINDEX, TROPONINI in the last 168 hours.  BNP: BNP (last 3 results) Recent Labs    09/11/16 1221 10/05/16 1545 06/05/17 0301  BNP 343.2* 322.3* 672.6*    ProBNP (last 3 results) No results for input(s): PROBNP in the last 8760  hours.   CBG: No results for input(s): GLUCAP in the last 168 hours.  Coagulation Studies: No results for input(s): LABPROT, INR in the last 72 hours.  Imaging: Dg Chest 2 View  Result Date: 06/04/2017 CLINICAL DATA:  Acute onset of generalized chest discomfort, shortness of breath with exertion, cough and  congestion. EXAM: CHEST  2 VIEW COMPARISON:  Chest radiograph performed 03/12/2017 FINDINGS: The lungs are well-aerated. Vascular congestion is noted. Increased interstitial markings may reflect mild interstitial edema. There is no evidence of pleural effusion or pneumothorax. The heart is mildly enlarged. An AICD is noted at the left chest wall, with a single lead ending at the right ventricle. No acute osseous abnormalities are seen. IMPRESSION: Vascular congestion and mild cardiomegaly. Increased interstitial markings may reflect mild interstitial edema. Electronically Signed   By: Roanna Raider M.D.   On: 06/04/2017 21:47   US Renal  Result Date: 06/05/2017 CLINICAL DATA:  Acute onset of renal insufficiency. Initial encounter. EXAM: RENAL / URINARY TRACT ULTRASOUND COMPLETE COMPARISON:  Renal ultrasound performed 12/06/2016 FINDINGS: Right Kidney: Length: 14.1 cm. Echogenicity within normal limits. No mass or hydronephrosis visualized. Left Kidney: Length: 12.6 cm. Echogenicity within normal limits. No mass or hydronephrosis visualized. Bladder: Decompressed and not well assessed. IMPRESSION: Unremarkable renal ultrasound.  No evidence of hydronephrosis. Electronically Signed   By: Roanna Raider M.D.   On: 06/05/2017 06:26     Patient Profile  Eric Murillo is a 55 year old with history of chronic systolic heart failure, NICM, PAF, OSA, hyperlipidemia, obesity, gout, and CKD Stage III admitted with a/c systolic heart failure.   In A flutter on admit.    Assessment/Plan   1. A/C Systolic Heart Failure, NICM. [employer] ICD- Interrogation pending.    ECHO 12/2016 25-30% . Repeat ECHO  NYHA IV on admit. Volume overloaded likely worsened with A flutter. Follow I/Os  -CXR with vascular congestion.  -SBP soft and he is normally hypertensive. Concern for low output watch closely.  . Cut carvedilol from 25 mg twice a day -->6.25 twice a day -No spiro/arb/dig with CKD  -Hold bidil for now.  - Watch  closely.  2. PAF --on admit back in A flutter with controlled rate.  -S/P DC-CV 2015 and 2017  -Continue amio 200 mg daily - Stop heparin.  -Restart eliquis 5 mg twice a day. Do not need to reduce given his age and weight.  - Need to try and restore NSR.  - Set up TEE-DC-CV.  3. AKI on CKD Stage stage III Creatinine baseline  2.5-2.7  Creatinine on admit 4.02  Renal US - no evidence of hydronephrosis 4. Morbid Obesity. Body mass index is 48.58 kg/m. 5. OSA- intolerant CPAP    Amy Clegg, NP 06/05/2017, 11:02 AM  Advanced Heart Failure Team Pager [phone] (M-F; 7a - 4p)  Please contact [facility] for night-coverage after hours (4p -7a ) and weekends on [email or web address]  Patient seen and examined with Tonye Becket, NP. We discussed all aspects of the encounter. I agree with the assessment and plan as stated above.   55 y/o obese male with h/o systolic HF due to NICM EF 25%. Admitted with recurrent HF, volume overload and AKI in setting of PAFL. Rate not elevated. Does not appear low output but may be borderline. Hopefully renal function will improve with diuresis. Will continue amiodarone. TEE/DC-CV arranged for tomorrow. Patient says he has only missed one dose of Eliquis. If that is true likely can do DC-CV without  TEE but I will leave to discretion of Dr. Shirlee Latch.   Echo reviewed personally. EF still 25% Mild RV dysfunction. He has been unable to tolerate CPAP.   Arvilla Meres, MD  11:32 PM

## 2017-06-05 NOTE — ED Provider Notes (Signed)
[facility] EMERGENCY DEPARTMENT Provider Note   CSN: [id number] Arrival date & time: 06/04/17  2124     History   Chief Complaint Chief Complaint  Patient presents with  . Chest Pain    HPI Eric Murillo is a 56 y.o. male.  Patient with past medical history remarkable for CHF and CKD and a flutter presents to the emergency department with chief complaint of chest pain and shortness of breath.  He states that the symptoms have been gradually worsening over the past 3-4 days.  He reports associated abdominal distention.  He states that he has felt like this in the past, when he has had too much fluid on his system.  He reports that he has been taking Lasix as prescribed.  He denies any fevers, chills, or cough.  Denies any other associated symptoms.  He denies any modifying factors.   The history is provided by the patient. No language interpreter was used.    Past Medical History:  Diagnosis Date  . AICD (automatic cardioverter/defibrillator) present   . Anemia of chronic disease   . Arthritis   . Atrial flutter (HCC)    DCCV 9/15  . Chest pain on exertion   . Chronic systolic congestive heart failure, NYHA class 2 (HCC)    EF 20-25% 1/16  . CKD (chronic kidney disease) stage 3, GFR 30-59 ml/min (HCC)   . Dyslipidemia   . Gout    Of big toe  . Hyperglycemia   . Hyperlipidemia   . Hypertension   . Morbid obesity with BMI of 45.0-49.9, adult (HCC)   . Non-ischemic cardiomyopathy (HCC)   . Organic erectile dysfunction   . OSA (obstructive sleep apnea)   . Pilonidal cyst   . Submandibular sialolithiasis    Right    Patient Active Problem List   Diagnosis Date Noted  . Eyelid abnormality 11/15/2016  . Long-term current use of opiate analgesic 07/03/2016  . 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 CHF (congestive heart failure), NYHA class III (HCC) 05/21/2015  . Long term current use of anticoagulant therapy 04/07/2015  . Health care maintenance  03/10/2015  . Osteoarthritis 06/30/2014  . Pilonidal cyst 04/27/2014  . Anemia of chronic disease 04/21/2014  . Gout 04/21/2014  . Atrial flutter (HCC) 03/24/2014  . Hyperglycemia 02/06/2012  . OSA (obstructive sleep apnea) 12/11/2011  . Cardiomyopathy, nonischemic (HCC) 09/30/2010  . Chronic systolic heart failure (HCC) 09/15/2010  . Encounter for preventive health examination 09/15/2010  . ERECTILE DYSFUNCTION, ORGANIC 03/03/2010  . Hyperlipidemia 07/04/2006  . Morbid obesity with BMI of 45.0-49.9, adult (HCC) 07/04/2006  . Hypertensive cardiovascular disease 05/30/2006  . CKD (chronic kidney disease) stage 4, GFR 15-29 ml/min (HCC) 05/30/2006    Past Surgical History:  Procedure Laterality Date  . CARDIAC CATHETERIZATION  2012  . COLONOSCOPY    . SALIVARY GLAND SURGERY  09/12/2012       Home Medications    Prior to Admission medications   Medication Sig Start Date End Date Taking? Authorizing Provider  allopurinol (ZYLOPRIM) 100 MG tablet TAKE 1 TABLET BY MOUTH TWO TIMES DAILY Patient taking differently: Take 100 mg by mouth two times a day 05/21/17  Yes Darreld Mclean, MD  amiodarone (PACERONE) 200 MG tablet TAKE 1 TABLET (200 MG TOTAL) BY MOUTH DAILY. 05/22/17  Yes Laurey Morale, MD  apixaban (ELIQUIS) 5 MG TABS tablet Take 1 tablet (5 mg total) by mouth 2 (two) times daily. 11/09/16  Yes Darreld Mclean, MD  BIDIL 20-37.5 MG tablet TAKE 2 TABLETS BY MOUTH 3 TIMES A DAY Patient taking differently: Take 2 tablets by mouth three times a day 05/22/17  Yes Laurey Morale, MD  carvedilol (COREG) 25 MG tablet Take 1 tablet (25 mg total) by mouth 2 (two) times daily with a meal. 11/22/16  Yes Darreld Mclean, MD  colchicine 0.6 MG tablet Take 0.6 mg 2 (two) times daily as needed by mouth (FOR GOUT FLARES). Reported on 01/18/2016   Yes [provider]  diclofenac sodium (VOLTAREN) 1 % GEL Apply 4 g topically 4 (four) times daily. Patient taking differently: Apply 4 g 4  (four) times daily as needed topically (AS DIRECTED FOR PAIN).  02/07/17  Yes Darreld Mclean, MD  KLOR-CON M20 20 MEQ tablet TAKE 1 TABLET (20 MEQ TOTAL) BY MOUTH 2 (TWO) TIMES DAILY. 04/12/17  Yes Bensimhon, Bevelyn Buckles, MD  metolazone (ZAROXOLYN) 2.5 MG tablet TAKE 1 TABLET (2.5 MG TOTAL) BY MOUTH ONCE DAILY Patient taking differently: Take 2.5 mg by mouth every Wednesday 05/22/17  Yes Laurey Morale, MD  spironolactone (ALDACTONE) 25 MG tablet TAKE 1 TABLET (25 MG TOTAL) BY MOUTH DAILY. Patient taking differently: Take 25 mg by mouth once a day 10/24/16  Yes Bensimhon, Bevelyn Buckles, MD  torsemide (DEMADEX) 20 MG tablet TAKE 4 TABLETS BY MOUTH EVERY MORNING AND 3 TABLETS EVERY EVENING Patient taking differently: Take 4 tablets (80 mg) by mouth in the morning and 3 tablets (60 mg) in the evening 05/18/17  Yes Bensimhon, Bevelyn Buckles, MD  acetaminophen (TYLENOL) 500 MG tablet Take 2 tablets (1,000 mg total) by mouth every 8 (eight) hours as needed. Patient taking differently: Take 1,000 mg every 8 (eight) hours as needed by mouth (for pain or headaches).  02/07/17 02/07/18  Darreld Mclean, MD  levofloxacin (LEVAQUIN) 500 MG tablet Take 1 tablet (500 mg total) by mouth daily. Patient not taking: Reported on 06/04/2017 03/13/17   Ward, Layla Maw, DO  metolazone (ZAROXOLYN) 2.5 MG tablet Take 1 Tablet Every Wednesday. Patient not taking: Reported on 06/04/2017 12/29/16   Laurey Morale, MD  torsemide (DEMADEX) 20 MG tablet TAKE 4 TABS IN AM AND 3 TABS IN PM Patient not taking: Reported on 06/04/2017 05/18/17   Laurey Morale, MD    Family History Family History  Problem Relation Age of Onset  . Hypertension Mother   . Hypertension Father   . Cancer Father        brother died of brain cancer; sister died of bone cancer  . Diabetes Sister   . Diabetes Brother   . Colon cancer Maternal Aunt   . Cardiomyopathy Neg Hx     Social History Social History   Tobacco Use  . Smoking status: Never Smoker  .  Smokeless tobacco: Never Used  Substance Use Topics  . Alcohol use: No    Alcohol/week: 0.0 oz  . Drug use: No     Allergies   Nsaids and Beta adrenergic blockers   Review of Systems Review of Systems  All other systems reviewed and are negative.    Physical Exam Updated Vital Signs BP 120/90 (BP Location: Right Arm)   Pulse 76   Temp 98 F (36.7 C) (Oral)   Resp 20   Wt (!) 147.4 kg (325 lb)   SpO2 100%   BMI 47.99 kg/m   Physical Exam  Constitutional: He is oriented to person, place, and time. He appears well-developed and well-nourished.  HENT:  Head: Normocephalic  and atraumatic.  Eyes: Conjunctivae and EOM are normal. Pupils are equal, round, and reactive to light. Right eye exhibits no discharge. Left eye exhibits no discharge. No scleral icterus.  Neck: Normal range of motion. Neck supple. No JVD present.  Cardiovascular: Normal rate, regular rhythm and normal heart sounds. Exam reveals no gallop and no friction rub.  No murmur heard. Pulmonary/Chest: Effort normal and breath sounds normal. No respiratory distress. He has no wheezes. He has no rales. He exhibits no tenderness.  Abdominal: Soft. He exhibits no distension and no mass. There is no tenderness. There is no rebound and no guarding.  Musculoskeletal: Normal range of motion. He exhibits no edema or tenderness.  Neurological: He is alert and oriented to person, place, and time.  Skin: Skin is warm and dry.  Psychiatric: He has a normal mood and affect. His behavior is normal. Judgment and thought content normal.  Nursing note and vitals reviewed.    ED Treatments / Results  Labs (all labs ordered are listed, but only abnormal results are displayed) Labs Reviewed  BASIC METABOLIC PANEL - Abnormal; Notable for the following components:      Result Value   Chloride 99 (*)    Glucose, Bld 114 (*)    BUN 63 (*)    Creatinine, Ser 4.02 (*)    Calcium 8.8 (*)    GFR calc non Af Amer 15 (*)    GFR  calc Af Amer 18 (*)    All other components within normal limits  CBC - Abnormal; Notable for the following components:   WBC 12.4 (*)    Hemoglobin 12.2 (*)    All other components within normal limits  BRAIN NATRIURETIC PEPTIDE  I-STAT TROPONIN, ED  I-STAT TROPONIN, ED    EKG  EKG Interpretation  Date/Time:  Monday June 04 2017 21:31:36 EST Ventricular Rate:  68 PR Interval:    QRS Duration: 128 QT Interval:  468 QTC Calculation: 497 R Axis:   -103 Text Interpretation:  ** Suspect arm lead reversal, interpretation assumes no reversal Undetermined rhythm Non-specific intra-ventricular conduction block Possible Lateral infarct , age undetermined Abnormal ECG Confirmed by Zadie Rhine ([id number]) on 06/05/2017 2:09:39 AM       Radiology Dg Chest 2 View  Result Date: 06/04/2017 CLINICAL DATA:  Acute onset of generalized chest discomfort, shortness of breath with exertion, cough and congestion. EXAM: CHEST  2 VIEW COMPARISON:  Chest radiograph performed 03/12/2017 FINDINGS: The lungs are well-aerated. Vascular congestion is noted. Increased interstitial markings may reflect mild interstitial edema. There is no evidence of pleural effusion or pneumothorax. The heart is mildly enlarged. An AICD is noted at the left chest wall, with a single lead ending at the right ventricle. No acute osseous abnormalities are seen. IMPRESSION: Vascular congestion and mild cardiomegaly. Increased interstitial markings may reflect mild interstitial edema. Electronically Signed   By: Roanna Raider M.D.   On: 06/04/2017 21:47    Procedures Procedures (including critical care time)  Medications Ordered in ED Medications - No data to display   Initial Impression / Assessment and Plan / ED Course  I have reviewed the triage vital signs and the nursing notes.  Pertinent labs & imaging results that were available during my care of the patient were reviewed by me and considered in my medical  decision making (see chart for details).     Patient with chest pain or shortness of breath.  Chest x-ray shows vascular congestion and mild cardiomegaly.  Also  has some increased interstitial markings, which may reflect mild interstitial edema.  I suspect that the patient likely has CHF exacerbation.  Will check BNP.  There is also noted that the creatinine is elevated from the patient's baseline.  Currently 4.02, up from the mid 2s-low 3s.  BNP is 672.  Patient is normal weight is 315, today he weighs 329.  Patient will likely need careful diuresis in the hospital due to renal function.  Patient seen by and discussed with Dr. Bebe ShaggyWickline, who agrees with plan for admission.  Discussed the patient with internal medicine teaching service, who will admit the patient.  Final Clinical Impressions(s) / ED Diagnoses   Final diagnoses:  Acute on chronic congestive heart failure, unspecified heart failure type [facility](HCC)  AKI (acute kidney injury) [facility](HCC)    ED Discharge Orders    None       Roxy HorsemanBrowning, Phyillis Dascoli, PA-C 06/05/17 [id number]    Zadie RhineWickline, Donald, MD 06/05/17 [phone]

## 2017-06-05 NOTE — Progress Notes (Signed)
  Echocardiogram 2D Echocardiogram has been performed.  Ijanae Macapagal T Smera Guyette 06/05/2017, 1:00 PM

## 2017-06-05 NOTE — H&P (Signed)
Date: 06/05/2017               Patient Name:  Eric Murillo MRN: [id number]  DOB: 1961/05/02 Age / Sex: 57 y.o., male   PCP: Zada Finders, MD         Medical Service: Internal Medicine Teaching Service         Attending Physician: Dr. Aldine Contes, MD    First Contact: Dr. Tawny Asal Pager: [phone]  Second Contact: Dr. Burgess Estelle Pager: [phone]       After Hours (After 5p/  First Contact Pager: [phone]  weekends / holidays): Second Contact Pager: [phone]   Chief Complaint:  Chest pain, shortness of breath  History of Present Illness:  Eric Murillo is a 56 yo with a PMH of combined systolic and diastolic HF (LVEF 70-35%, G2DD echo 2018) s/p AICD placement, CKD stage 3/4, A-Flutter on anticoagulation, HTN, HLD, and obesity who is presenting with progressively worsening chest pain and shortness of breath for 3-4 days. Patient describes his chest pain as a "tightness" that occurs whenever he gets up and walks around. The tightness occurs in his entire chest. It does not radiate and is not associated with nausea, vomiting, or diaphoresis. It is, however, associated with progressive shortness of breath and dizziness. The patient states over the past few days he cannot even walk a few steps without feeling like he needs to catch his breath or steady himself. He has noticed increased bilateral leg swelling and abdominal girth, but does not know if he has gained any weight recently because he does not weigh himself daily. He knows that he is supposed to limit the amount of fluid he drinks every day to <2L but has not done this very well for the past week or so. The patient states he usually takes all medications every day as prescribed, with the exception of his weekly metolazone. He skipped this medicine last Wednesday because he did not feel that he needed to take it. Even though he has been taking his other diuretic as prescribed, the patient thinks he has been urinating less on  this medication.  Upon arrival to the ED the patient was afebrile and non tachycardic with oxygen saturations >92% on room air. The patient had troponin levels <0.03 x2, with no signs of ischemia on EKG. The patient's Cr was found to be elevated to 4.02, with previous baseline around 2.8. His BNP was elevated to 672.6. A chest X ray showed pulmonary vascular congestion with mild interstitial edema, consistent with volume overload. The internal medicine teaching service was called for admission.   Meds:  Current Meds  Medication Sig  . allopurinol (ZYLOPRIM) 100 MG tablet TAKE 1 TABLET BY MOUTH TWO TIMES DAILY (Patient taking differently: Take 100 mg by mouth two times a day)  . amiodarone (PACERONE) 200 MG tablet TAKE 1 TABLET (200 MG TOTAL) BY MOUTH DAILY.  Marland Kitchen apixaban (ELIQUIS) 5 MG TABS tablet Take 1 tablet (5 mg total) by mouth 2 (two) times daily.  Marland Kitchen BIDIL 20-37.5 MG tablet TAKE 2 TABLETS BY MOUTH 3 TIMES A DAY (Patient taking differently: Take 2 tablets by mouth three times a day)  . carvedilol (COREG) 25 MG tablet Take 1 tablet (25 mg total) by mouth 2 (two) times daily with a meal.  . colchicine 0.6 MG tablet Take 0.6 mg 2 (two) times daily as needed by mouth (FOR GOUT FLARES). Reported on 01/18/2016  . diclofenac sodium (VOLTAREN) 1 % GEL Apply  4 g topically 4 (four) times daily. (Patient taking differently: Apply 4 g 4 (four) times daily as needed topically (AS DIRECTED FOR PAIN). )  . KLOR-CON M20 20 MEQ tablet TAKE 1 TABLET (20 MEQ TOTAL) BY MOUTH 2 (TWO) TIMES DAILY.  . metolazone (ZAROXOLYN) 2.5 MG tablet TAKE 1 TABLET (2.5 MG TOTAL) BY MOUTH ONCE DAILY (Patient taking differently: Take 2.5 mg by mouth every Wednesday)  . spironolactone (ALDACTONE) 25 MG tablet TAKE 1 TABLET (25 MG TOTAL) BY MOUTH DAILY. (Patient taking differently: Take 25 mg by mouth once a day)  . torsemide (DEMADEX) 20 MG tablet TAKE 4 TABLETS BY MOUTH EVERY MORNING AND 3 TABLETS EVERY EVENING (Patient taking  differently: Take 4 tablets (80 mg) by mouth in the morning and 3 tablets (60 mg) in the evening)   Allergies: Allergies as of 06/04/2017 - Review Complete 06/04/2017  Allergen Reaction Noted  . Nsaids Other (See Comments) 06/04/2017  . Beta adrenergic blockers Other (See Comments) 10/19/2008   Past Medical History: Past Medical History:  Diagnosis Date  . AICD (automatic cardioverter/defibrillator) present   . Anemia of chronic disease   . Arthritis   . Atrial flutter ([place])    DCCV 9/15  . Chest pain on exertion   . Chronic systolic congestive heart failure, NYHA class 2 (HCC)    EF 20-25% 1/16  . CKD (chronic kidney disease) stage 3, GFR 30-59 ml/min (HCC)   . Dyslipidemia   . Gout    Of big toe  . Hyperglycemia   . Hyperlipidemia   . Hypertension   . Morbid obesity with BMI of 45.0-49.9, adult ([place])   . Non-ischemic cardiomyopathy ([place])   . Organic erectile dysfunction   . OSA (obstructive sleep apnea)   . Pilonidal cyst   . Submandibular sialolithiasis    Right   Past Surgical History: Past Surgical History:  Procedure Laterality Date  . CARDIAC CATHETERIZATION  2012  . COLONOSCOPY    . SALIVARY GLAND SURGERY  09/12/2012   Family History:  Family History  Problem Relation Age of Onset  . Hypertension Mother   . Hypertension Father   . Cancer Father        brother died of brain cancer; sister died of bone cancer  . Diabetes Sister   . Diabetes Brother   . Colon cancer Maternal Aunt   . Cardiomyopathy Neg Hx    Social History:  Patient lives with mother. Not currently working. Denies tobacco use, alcohol use, and illicit drug use.   Review of Systems: A complete ROS was negative except as per HPI.   Physical Exam: Blood pressure 125/78, pulse 65, temperature 98 F (36.7 C), temperature source Oral, resp. rate 20, weight (!) 329 lb (149.2 kg), SpO2 95 %.   Physical Exam  Constitutional:  Obese gentleman sitting comfortably on edge of bed in no acute  distress.  HENT:  Mouth/Throat: Oropharynx is clear and moist. No oropharyngeal exudate.  Eyes: EOM are normal. Pupils are equal, round, and reactive to light.  Cardiovascular: Normal rate, normal heart sounds and intact distal pulses. Exam reveals no friction rub.  No murmur heard. AICD in place in L upper chest with no signs of overlying erythema.  Respiratory: Effort normal.  No accessory muscle use or nasal flaring. Good air movement throughout without wheezing and crackles.   GI: Bowel sounds are normal. There is no tenderness. There is no rebound.  Obese abdomen which appears distended and taut on exam.  Musculoskeletal: He exhibits edema (2+ pitting edema to mid shins bilaterally).  Neurological:  Face strength and sensation intact bilaterally. Tongue midline. Gross motor and sensation to light touch of upper and lower extremities intact bilaterally.  Skin: Skin is warm and dry. No rash noted. No erythema.   EKG: personally reviewed my interpretation is normal sinus rhythm without signs of ST elevation or T wave inversion. Low voltage EKG, unchanged from prior EKG's.  CXR: personally reviewed my interpretation is possible cardiomegaly with AICD in place. Increased vascular congestion with interstitial edema (fluid in R minor fissure) suggesting pulmonary edema. No pleural effusions or focal areas of consolidation.   Assessment & Plan by Problem: Active Problems:   Acute on chronic combined systolic (congestive) and diastolic (congestive) heart failure (HCC)  Kinte Trim is a 56 yo with a PMH of combined systolic and diastolic HF (LVEF 64-40%, G2DD echo 2018) s/p AICD placement, CKD stage 3/4, A-Flutter on anticoagulation, HTN, HLD, and obesity who presented with signs and symptoms of volume overload, consistent with acute on chronic HF. The patient was admitted to the internal medicine teaching service for management. The specific problems addressed during admission are as  follows:  Acute on Chronic HF (LVEF 25-30%, G2DD in 2018) s/p ICD in 2016: Patient reports history of progressively worsening chest tightness and SOB with exertion, which is associated with abdominal and lower extremity swelling. Patient does not regularly weigh himself at home but was found to be 325 lbs at last cardiology visit. Patient thinks he was ~320 lbs after previous hospitalization for volume overload. He currently weighs 329 lbs. He has not limited fluid intake to <2L at home and has missed at least one dose of metolazone. Patient also thinks his usual dose of torsemide 80 mg qAM and 60 mg qPM has not been working as well leading up to admission, as he notes fluctuations in daily urine output with a recent decrease in urination. All of these factors are likely contributing to current presentation. May need to conisder adjusting home torsemide dose upon discharge. Will plan for IV diuresis and consider CHF consult since patient regularly follows with them as outpatient.  -IV Lasix 120 mg BID -Carvedilol 25 mg BID -Fluid restrict 1.5L  -Strict I&O, daily weights  Acute on chronic kidney injury: Cr elevated to 4.0 (eGFR 15) from baseline around 2.7-2.9 (eGFR 25). Patient follows with [facility], last seen in 11/29/2016. This acute change is likely from relative volume depeletion 2/2 CHF exacerbation but will obtain renal ultrasound to rule out post-renal causes of AKI and hold potentially nephrotoxic medications during hospitalization.  -Daily BMP while on lasix -Follow up renal ultrasound  A-Flutter s/p cardioversion 2017: Patient on amiodarone and Eliquis at home. Home dose of Eliquis (5 mg BID) will be decreased 2/2 AKI present on admission. -Eliquis 2.5 mg BID -Amiodarone 200 mg daily  HTN: Patient normotensive in ED. Will hold home Bidil for now and plan to add if patient becomes hypertensive. Will continue carvedilol and spironolactone while inpatient.  -Hold home BiDil  TID  Fluids/Electrolytes: None, replete K+ as needed Nutrition: Heart Healthy VTE prophylaxis: Lovenox Code Status: Full  Dispo: Admit patient to Observation with expected length of stay less than 2 midnights.  SignedThomasene Ripple, MD 06/05/2017, 5:32 AM  Pager: [phone]

## 2017-06-05 NOTE — Progress Notes (Signed)
ANTICOAGULATION CONSULT NOTE  Pharmacy Consult for heparin Indication: VTE prophylaxis  Assessment: 4 yom with history of afib on apixaban PTA - last dose 11/12 at 1300. Pharmacy consulted to dose heparin for VTE prophylaxis while apixaban on hold with AKI. Confirmed with MD - does not want treatment dosing at this time. Hg 12.2, plt wnl. No bleed documented.  Goal of Therapy:  VTE prevention Monitor platelets by anticoagulation protocol: Yes   Plan:  Heparin 5000 units SQ Q8h Monitor CBC, s/sx bleeding F/u resuming apixaban as appropriate  Babs Bertin, PharmD, BCPS Clinical Pharmacist 06/05/2017 7:28 AM

## 2017-06-05 NOTE — ED Notes (Signed)
Patient returned from Ultrasound. 

## 2017-06-06 ENCOUNTER — Encounter (HOSPITAL_COMMUNITY): Payer: Self-pay

## 2017-06-06 ENCOUNTER — Encounter (HOSPITAL_COMMUNITY)
Admission: EM | Disposition: A | Discharge status: Home / Self Care | Payer: Self-pay | Source: Home / Self Care | Attending: Internal Medicine

## 2017-06-06 ENCOUNTER — Inpatient Hospital Stay (HOSPITAL_COMMUNITY): Payer: Medicaid Other

## 2017-06-06 ENCOUNTER — Inpatient Hospital Stay (HOSPITAL_COMMUNITY): Payer: Medicaid Other | Admitting: Anesthesiology

## 2017-06-06 ENCOUNTER — Encounter: Payer: Self-pay | Admitting: Pharmacist

## 2017-06-06 DIAGNOSIS — I340 Nonrheumatic mitral (valve) insufficiency: Secondary | ICD-10-CM

## 2017-06-06 DIAGNOSIS — I4892 Unspecified atrial flutter: Secondary | ICD-10-CM

## 2017-06-06 DIAGNOSIS — N184 Chronic kidney disease, stage 4 (severe): Secondary | ICD-10-CM

## 2017-06-06 DIAGNOSIS — I441 Atrioventricular block, second degree: Secondary | ICD-10-CM

## 2017-06-06 DIAGNOSIS — E669 Obesity, unspecified: Secondary | ICD-10-CM

## 2017-06-06 DIAGNOSIS — Z7901 Long term (current) use of anticoagulants: Secondary | ICD-10-CM

## 2017-06-06 DIAGNOSIS — Z79899 Other long term (current) drug therapy: Secondary | ICD-10-CM

## 2017-06-06 DIAGNOSIS — Z9114 Patient's other noncompliance with medication regimen: Secondary | ICD-10-CM

## 2017-06-06 DIAGNOSIS — I5023 Acute on chronic systolic (congestive) heart failure: Secondary | ICD-10-CM

## 2017-06-06 HISTORY — PX: TEE WITHOUT CARDIOVERSION: SHX5443

## 2017-06-06 HISTORY — PX: CARDIOVERSION: SHX1299

## 2017-06-06 LAB — BASIC METABOLIC PANEL
Anion gap: 12 (ref 5–15)
BUN: 63 mg/dL — ABNORMAL HIGH (ref 6–20)
CALCIUM: 9.2 mg/dL (ref 8.9–10.3)
CO2: 30 mmol/L (ref 22–32)
CREATININE: 3.33 mg/dL — AB (ref 0.61–1.24)
Chloride: 100 mmol/L — ABNORMAL LOW (ref 101–111)
GFR calc non Af Amer: 19 mL/min — ABNORMAL LOW (ref >60)
GFR, EST AFRICAN AMERICAN: 22 mL/min — AB (ref >60)
Glucose, Bld: 104 mg/dL — ABNORMAL HIGH (ref 65–99)
Potassium: 4 mmol/L (ref 3.5–5.1)
SODIUM: 142 mmol/L (ref 135–145)

## 2017-06-06 SURGERY — ECHOCARDIOGRAM, TRANSESOPHAGEAL
Anesthesia: General

## 2017-06-06 MED ORDER — ONDANSETRON HCL 4 MG/2ML IJ SOLN: 4.0000 mg | Freq: Once | INTRAMUSCULAR | Status: DC | PRN

## 2017-06-06 MED ORDER — AMIODARONE HCL 200 MG PO TABS
200.0000 mg | ORAL_TABLET | Freq: Two times a day (BID) | ORAL | Status: DC
  Administered 2017-06-06 – 2017-06-11 (×11): 200 mg via ORAL
  Filled 2017-06-06 (×11): qty 1

## 2017-06-06 MED ORDER — DEXTROSE 5 % IV SOLN
INTRAVENOUS | Status: DC | PRN
  Administered 2017-06-06: 40 ug/min via INTRAVENOUS

## 2017-06-06 MED ORDER — ISOSORB DINITRATE-HYDRALAZINE 20-37.5 MG PO TABS
0.5000 | ORAL_TABLET | Freq: Three times a day (TID) | ORAL | Status: DC
  Administered 2017-06-06 (×2): 0.5 via ORAL
  Filled 2017-06-06 (×2): qty 1

## 2017-06-06 MED ORDER — PROPOFOL 500 MG/50ML IV EMUL
INTRAVENOUS | Status: DC | PRN
  Administered 2017-06-06: 80 ug/kg/min via INTRAVENOUS

## 2017-06-06 MED ORDER — PROPOFOL 10 MG/ML IV BOLUS
INTRAVENOUS | Status: DC | PRN
  Administered 2017-06-06: 100 mg via INTRAVENOUS

## 2017-06-06 MED ORDER — BUTAMBEN-TETRACAINE-BENZOCAINE 2-2-14 % EX AERO
INHALATION_SPRAY | CUTANEOUS | Status: DC | PRN
  Administered 2017-06-06: 2 via TOPICAL

## 2017-06-06 MED ORDER — SODIUM CHLORIDE 0.9 % IV SOLN: INTRAVENOUS | Status: DC

## 2017-06-06 MED ORDER — METOLAZONE 2.5 MG PO TABS
2.5000 mg | ORAL_TABLET | Freq: Once | ORAL | Status: AC
  Administered 2017-06-06: 2.5 mg via ORAL
  Filled 2017-06-06: qty 1

## 2017-06-06 NOTE — Progress Notes (Signed)
Patient ID: Hani Bladow, male   DOB: [birth year], 56 y.o.   MRN: [id number]     Advanced Heart Failure Rounding Note   Primary Cardiologist: Shirlee Latch  Subjective:    Patient remains in atypical atrial flutter, rate in 50s.  He did not diurese much yesterday, weight down 1 lb.  Creatinine down from 4 => 3.33.  Baseline 2.7.  Still dyspnea moving around room and orthopnea.   Echo: EF 20-25%, diffuse hypokinesis, moderately dilated LV, mildly decreased RV systolic function, PASP 66 mmHg.   Objective:   Weight Range: (!) 326 lb 8 oz (148.1 kg) Body mass index is 47.52 kg/m.   Vital Signs:   Temp:  [97.7 F (36.5 C)-98.6 F (37 C)] 98.6 F (37 C) (11/14 0429) Pulse Rate:  [52-85] 57 (11/14 0429) Resp:  [15-26] 18 (11/14 0429) BP: (93-130)/(54-87) 130/87 (11/14 0429) SpO2:  [89 %-100 %] 97 % (11/14 0429) Weight:  [326 lb 8 oz (148.1 kg)-327 lb 12.8 oz (148.7 kg)] 326 lb 8 oz (148.1 kg) (11/14 0429) Last BM Date: 06/04/17  Weight change: Filed Weights   06/05/17 0406 06/05/17 2032 06/06/17 0429  Weight: (!) 329 lb (149.2 kg) (!) 327 lb 12.8 oz (148.7 kg) (!) 326 lb 8 oz (148.1 kg)    Intake/Output:   Intake/Output Summary (Last 24 hours) at 06/06/2017 0814 Last data filed at 06/06/2017 0700 Gross per 24 hour  Intake 600 ml  Output 975 ml  Net -375 ml      Physical Exam    General:  Well appearing. No resp difficulty HEENT: Normal Neck: Supple. JVP 14-16 cm. Carotids 2+ bilat; no bruits. No lymphadenopathy or thyromegaly appreciated. Cor: PMI nonpalpable. Regular rate & rhythm. No rubs, gallops or murmurs. Lungs: Clear Abdomen: Soft, nontender, nondistended. No hepatosplenomegaly. No bruits or masses. Good bowel sounds. Extremities: No cyanosis, clubbing, rash.  2+ edema to knees bilaterally.  Neuro: Alert & orientedx3, cranial nerves grossly intact. moves all 4 extremities w/o difficulty. Affect pleasant   Telemetry   Atypical atrial flutter rate 50s-60s  (personally reviewed)   Labs    CBC Recent Labs    06/04/17 2132  WBC 12.4*  HGB 12.2*  HCT 40.2  MCV 93.5  PLT 212   Basic Metabolic Panel Recent Labs    03/07/47 1137 06/06/17 0527  NA 140 142  K 3.7 4.0  CL 100* 100*  CO2 29 30  GLUCOSE 133* 104*  BUN 62* 63*  CREATININE 3.57* 3.33*  CALCIUM 9.0 9.2   Liver Function Tests No results for input(s): AST, ALT, ALKPHOS, BILITOT, PROT, ALBUMIN in the last 72 hours. No results for input(s): LIPASE, AMYLASE in the last 72 hours. Cardiac Enzymes No results for input(s): CKTOTAL, CKMB, CKMBINDEX, TROPONINI in the last 72 hours.  BNP: BNP (last 3 results) Recent Labs    09/11/16 1221 10/05/16 1545 06/05/17 0301  BNP 343.2* 322.3* 672.6*    ProBNP (last 3 results) No results for input(s): PROBNP in the last 8760 hours.   D-Dimer No results for input(s): DDIMER in the last 72 hours. Hemoglobin A1C No results for input(s): HGBA1C in the last 72 hours. Fasting Lipid Panel No results for input(s): CHOL, HDL, LDLCALC, TRIG, CHOLHDL, LDLDIRECT in the last 72 hours. Thyroid Function Tests No results for input(s): TSH, T4TOTAL, T3FREE, THYROIDAB in the last 72 hours.  Invalid input(s): FREET3  Other results:   Imaging     No results found.   Medications:     Scheduled Medications: .  allopurinol  100 mg Oral BID  . amiodarone  200 mg Oral BID  . apixaban  5 mg Oral BID  . carvedilol  6.25 mg Oral BID WC  . metolazone  2.5 mg Oral Once  . sodium chloride flush  3 mL Intravenous Q12H     Infusions: . sodium chloride 250 mL (06/05/17 1510)  . furosemide Stopped (06/05/17 2016)     PRN Medications:  acetaminophen **OR** acetaminophen, senna-docusate, sodium chloride flush    Patient Profile   56 yo with history of nonischemic cardiomyopathy/chronic systolic CHF, CKD stage 3 baseline creatinine 2.7, and paroxymal atrial flutter presents with acute on chronic systolic CHF in setting of  recurrent atrial flutter.   Assessment/Plan   1. Acute on chronic systolic CHF: Nonischemic cardiomyopathy.  Echo this admission with stable EF 20-25%.  Associated AKI on CKD.  It is possible that CHF exacerbation was triggered by atrial flutter as it has been in the past.  He remains volume overloaded and did not diurese particularly well yesterday though creatinine is down some.  - Continue Lasix 120 mg IV bid.  - Add dose of metolazone 2.5 mg x 1.  - Coreg decreased to 6.25 mg bid.  - As long as BP remains stable, may start back on Bidil at lower dose (1/2 tab tid).  - Need to restore NSR.  2. AKI on CKD stage 3: Creatinine up to 4 at admission, now down to 3.33.  He is volume overloaded.  Hopefully as he diureses and renal venous pressure decreases, creatinine will come down towards his baseline (2.7).  Avoid hypotension.  3. Atrial flutter: Atypical, recurrent.  Occurred again despite amiodarone use.  He seems to tolerate atrial flutter poorly, has had CHF flares when he has had flutter in the past.  He has missed some recent Eliquis doses. Need to restore NSR given CHF.  - Continue Eliquis.  - Will arrange for TEE-guided DCCV today.   - Increase amiodarone to 200 mg bid for now.  - Will ask EP to see for ?flutter ablation for more permanent treatment.  He was seen by EP in the past, atypical flutter was not thought to be a straightforward ablation target.  Length of Stay: 1  Dalton McLean, MD  06/06/2017, 8:14 AM  Advanced Heart Failure Team Pager [phone] (M-F; 7a - 4p)  Please contact [facility] for night-coverage after hours (4p -7a ) and weekends on [email or web address]  

## 2017-06-06 NOTE — Transfer of Care (Signed)
Immediate Anesthesia Transfer of Care Note  Patient: Eric Murillo  Procedure(s) Performed: TRANSESOPHAGEAL ECHOCARDIOGRAM (TEE) (N/A ) CARDIOVERSION (N/A )  Patient Location: PACU  Anesthesia Type:MAC  Level of Consciousness: awake, alert  and drowsy  Airway & Oxygen Therapy: Patient Spontanous Breathing and Patient connected to nasal cannula oxygen  Post-op Assessment: Report given to RN and Post -op Vital signs reviewed and stable  Post vital signs: Reviewed and stable  Last Vitals:  Vitals:   06/06/17 1212 06/06/17 1220  BP: 131/75 125/75  Pulse: 71 72  Resp:    Temp: 36.8 C   SpO2: 96% 93%    Last Pain:  Vitals:   06/06/17 1212  TempSrc: Oral  PainSc:          Complications: No apparent anesthesia complications

## 2017-06-06 NOTE — CV Procedure (Signed)
Procedure: TEE  Indication: Atrial flutter  Sedation: Propofol per anesthesiology.   Findings: Please see echo section for full report.  Mildly dilated LV with EF 25-30%, diffuse hypokinesis.  Mildly dilated RV with moderately decreased systolic function.  There was an ICD in the RV.  Mild TR.  Mild to moderate central mitral regurgitation.  Trileaflet aortic valve.  Trivial AI with no significant stenosis.  The left and right atria were moderately dilated.  No LA appendage thrombus.  There was no PFO or ASD by color doppler.  Normal caliber aorta with minimal plaque.   May proceed to DCCV.   [name] 06/06/2017 11:55 AM

## 2017-06-06 NOTE — Interval H&P Note (Signed)
History and Physical Interval Note:  06/06/2017 11:23 AM  Blanchard Kelch  has presented today for surgery, with the diagnosis of a fib  The various methods of treatment have been discussed with the patient and family. After consideration of risks, benefits and other options for treatment, the patient has consented to  Procedure(s): TRANSESOPHAGEAL ECHOCARDIOGRAM (TEE) (N/A) CARDIOVERSION (N/A) as a surgical intervention .  The patient's history has been reviewed, patient examined, no change in status, stable for surgery.  I have reviewed the patient's chart and labs.  Questions were answered to the patient's satisfaction.     Calder Oblinger [employer]

## 2017-06-06 NOTE — Progress Notes (Signed)
   Subjective: No acute events overnight. He reports mild improvement in his LE edema, breathing has improved. Difficulty sleeping overnight due to urination and being in hospital. Net negative 1.4 L since admission as of morning rounds.   Objective:  Vital signs in last 24 hours: Vitals:   06/05/17 1815 06/05/17 2032 06/06/17 0043 06/06/17 0429  BP: 102/75 122/75 (!) 98/54 130/87  Pulse: 62 60 (!) 58 (!) 57  Resp: 18 20 18 18   Temp:  97.7 F (36.5 C) 98.5 F (36.9 C) 98.6 F (37 C)  TempSrc:  Oral Oral Oral  SpO2: 91% 97% 98% 97%  Weight:  (!) 327 lb 12.8 oz (148.7 kg)  (!) 326 lb 8 oz (148.1 kg)  Height:  5' 9.5" (1.765 m)     General: Obese male, sitting in chair, no acute distress CV: AICD L chest wall, no murmur, normal rate  Resp: Clear breath sounds, normal work of breathing, no distress  Abd: Soft, +BS, obese with edema  Extr: Interval improvement to pitting edema to bilateral LEs  Neuro: Alert and oriented x3 Skin: Warm, dry, acrochordons to neck      Assessment/Plan:  HFrEF exacerbation (EF 25-30%, s/p ICD) Patient presented with progressive chest tightness and dyspnea on exertion with increased swelling and edema on chest x-ray in the setting of nonadherence with fluid restriction missed metolazone dose.  Estimated dry weight per patient around 320 pounds, currently 329.  He was started on IV diuresis. Cardiology consulted, appreciate recommendations. He is now s/p cardioversion as below which should improve his HF and volume status.  --Cont IV Lasix 120 mg BID with metolazone 2.5 mg x1 --Strict I/Os, daily weights, fluid and salt restriction --Bidil 0.5 tablet TID (half home dose)   H/o Atrial Flutter s/p cardioversion (2017) Patient has a history of atrial flutter and cardioversion performed in 2017 and was found to be in atrial flutter on admission. It is unclear if his a-flutter caused his HF exacerbation or if it is a result, though conversion will enable better  tx of his heart failure. Cards performed TEE with cardioversion today, noted to be in Type 1 2nd degree AV block following procedure.  --Tele --Increase Amiodarone to 200 mg BID  --Cont Eliquis  --Hold coreg    Acute on Chronic Kidney Injury (CKD IV)  Patient has a history of chronic kidney disease stage IV with baseline creatinine around 2.7-2.9.  Creatinine on admission elevated to 4.0.  Renal ultrasound on admission without obstruction.  Likely due to volume overload and has improved with diuresis, currently 3.3.  --BMP, replete electrolytes prn  Dispo: Anticipated discharge in approximately 1-2 day(s).   Ginger Carne, MD 06/06/2017, 6:57 AM Pager: [phone]

## 2017-06-06 NOTE — Procedures (Signed)
Electrical Cardioversion Procedure Note Eric Murillo [id number] 12-08-1960  Procedure: Electrical Cardioversion Indications:  Atrial Flutter  Procedure Details Consent: Risks of procedure as well as the alternatives and risks of each were explained to the (patient/caregiver).  Consent for procedure obtained. Time Out: Verified patient identification, verified procedure, site/side was marked, verified correct patient position, special equipment/implants available, medications/allergies/relevent history reviewed, required imaging and test results available.  Performed  Patient placed on cardiac monitor, pulse oximetry, supplemental oxygen as necessary.  Sedation given: Propofol. Pacer pads placed anterior and posterior chest.  Cardioverted 1 time(s).  Cardioverted at 200J.  Evaluation Findings: Post procedure EKG shows: NSR Complications: None Patient did tolerate procedure well.   [name] 06/06/2017, 12:02 PM

## 2017-06-06 NOTE — Anesthesia Preprocedure Evaluation (Addendum)
Anesthesia Evaluation  Patient identified by MRN, date of birth, ID band Patient awake    Reviewed: Allergy & Precautions, NPO status , Patient's Chart, lab work & pertinent test results, reviewed documented beta blocker date and time   Airway Mallampati: I  TM Distance: >3 FB Neck ROM: Full    Dental no notable dental hx. (+) Teeth Intact   Pulmonary sleep apnea and Continuous Positive Airway Pressure Ventilation ,    Pulmonary exam normal breath sounds clear to auscultation       Cardiovascular hypertension, Pt. on medications and Pt. on home beta blockers +CHF  + Cardiac Defibrillator  Rhythm:Irregular Rate:Normal  Non Ischemic CM Echo 08/21/2014-  Left ventricle: The cavity size was severely dilated. Wall thickness was normal. Systolic function was severely reduced. Theestimated ejection fraction was in the range of 25% to 30%. - Mitral valve: There was mild to moderate regurgitation. - Left atrium: The atrium was severely dilated. - Pulmonary arteries: PA peak pressure: 53 mm Hg (S).   Neuro/Psych  Neuromuscular disease negative psych ROS   GI/Hepatic negative GI ROS, Neg liver ROS,   Endo/Other  Morbid obesityHyperlipidemia  Renal/GU Renal InsufficiencyRenal disease  negative genitourinary   Musculoskeletal  (+) Arthritis , Osteoarthritis,    Abdominal (+) + obese,   Peds  Hematology  (+) anemia , Eliquis last dose this am   Anesthesia Other Findings   Reproductive/Obstetrics                            Anesthesia Physical Anesthesia Plan  ASA: III  Anesthesia Plan: MAC and General   Post-op Pain Management:    Induction: Intravenous  PONV Risk Score and Plan: Treatment may vary due to age or medical condition, Ondansetron, Propofol infusion and Promethazine  Airway Management Planned: Natural Airway, Mask and Nasal Cannula  Additional Equipment:   Intra-op Plan:    Post-operative Plan:   Informed Consent: I have reviewed the patients History and Physical, chart, labs and discussed the procedure including the risks, benefits and alternatives for the proposed anesthesia with the patient or authorized representative who has indicated his/her understanding and acceptance.   Dental advisory given  Plan Discussed with: Anesthesiologist, CRNA and Surgeon  Anesthesia Plan Comments:         Anesthesia Quick Evaluation

## 2017-06-06 NOTE — Progress Notes (Signed)
Contacted [facility] pharmacist who states patient has been filling carvedilol, spironolactone, and torsemide consistently and picked these up on 05/15/17. However, amiodarone, BIDIL, and metolazone are on hold and have not been filled in over 1 month.

## 2017-06-06 NOTE — Progress Notes (Signed)
Physical Therapy Treatment Patient Details Name: Eric KelchJasper Murillo MRN: [id number] DOB: 11/24/1960 Today's Date: 06/06/2017    History of Present Illness Pt is a 56 y/o male admitted secondary to chest pain and SOB. Per MD notes, suspect CHF exacerbation. PMH includes OSA, HTN, CHF, CKD, a flutter, gout, non ischemic cardiomyopathy, and AICD    PT Comments    Pt sitting in chair with family upon arrival. Declined ambulation at first as had just eaten. Agreed to ambulate when asked again. Able to get up with mod I but was wobbly when first began walking. Pt ambulated with increased gait speed. Pt was ready to be finished walking and increased pace to get back to room. HR increased to 152 bpm. Cued pt to stop and take deep breaths. Pt aggravated by stop. Able to decrease HR back to 108-117 bpm after deep breathing. Discussed pacing with pt; will need to be reinforced. He declined doing exercises.  Follow Up Recommendations  No PT follow up;Supervision for mobility/OOB     Equipment Recommendations  None recommended by PT    Recommendations for Other Services       Precautions / Restrictions Precautions Precautions: None Restrictions Weight Bearing Restrictions: No    Mobility  Bed Mobility Overal bed mobility: Needs Assistance             General bed mobility comments: OOB in chair on my arrival.   Transfers Overall transfer level: Modified independent Equipment used: None Transfers: Sit to/from Stand Sit to Stand: Supervision         General transfer comment: Pt required increased time.  Ambulation/Gait Ambulation/Gait assistance: Min guard Ambulation Distance (Feet): 240 Feet Assistive device: None Gait Pattern/deviations: Step-through pattern;Wide base of support     General Gait Details: Pt's gait was fast. No complaints of chest tightness or pain. Pt increased speed towards end of ambulation and HR increased to 152 bpm. Pt cued to stop and take deep breathes;  decreased to 108-117 bpm. Discussed importance of pacing.   Stairs            Wheelchair Mobility    Modified Rankin (Stroke Patients Only)       Balance Overall balance assessment: Needs assistance Sitting-balance support: No upper extremity supported;Feet supported Sitting balance-Leahy Scale: Good     Standing balance support: No upper extremity supported;During functional activity Standing balance-Leahy Scale: Fair Standing balance comment: Upon standing pt was wobbly; able to self correct with no LOB.                            Cognition Arousal/Alertness: Awake/alert Behavior During Therapy: WFL for tasks assessed/performed Overall Cognitive Status: Within Functional Limits for tasks assessed                                        Exercises      General Comments        Pertinent Vitals/Pain Pain Assessment: No/denies pain    Home Living                      Prior Function            PT Goals (current goals can now be found in the care plan section) Acute Rehab PT Goals Patient Stated Goal: not discussed PT Goal Formulation: With patient Time For Goal Achievement: 06/12/17  Potential to Achieve Goals: Good Progress towards PT goals: Progressing toward goals    Frequency    Min 3X/week      PT Plan Current plan remains appropriate    Co-evaluation              AM-PAC PT "6 Clicks" Daily Activity  Outcome Measure  Difficulty turning over in bed (including adjusting bedclothes, sheets and blankets)?: A Little Difficulty moving from lying on back to sitting on the side of the bed? : A Little Difficulty sitting down on and standing up from a chair with arms (e.g., wheelchair, bedside commode, etc,.)?: None Help needed moving to and from a bed to chair (including a wheelchair)?: A Little Help needed walking in hospital room?: A Little Help needed climbing 3-5 steps with a railing? : A Little 6  Click Score: 19    End of Session Equipment Utilized During Treatment: Gait belt Activity Tolerance: Patient tolerated treatment well Patient left: in chair;with call bell/phone within reach Nurse Communication: Mobility status PT Visit Diagnosis: Other abnormalities of gait and mobility (R26.89);Unsteadiness on feet (R26.81)     Time: 4098-1191 PT Time Calculation (min) (ACUTE ONLY): 12 min  Charges:  $Gait Training: 8-22 mins                    G Codes:       Sharlene Motts, SPTA   Sharlene Motts 06/06/2017, 2:40 PM

## 2017-06-06 NOTE — Addendum Note (Signed)
Addendum  created 06/06/17 1838 by Coralee Rud, CRNA   Charge Capture section accepted, Intraprocedure Meds edited

## 2017-06-06 NOTE — Consult Note (Signed)
Cardiology Consultation:   Patient ID: Eric Murillo; [id number]; Aug 13, 1960   Admit date: 06/05/2017 Date of Consult: 06/06/2017  Primary Care Provider: Darreld Mclean, MD Primary Cardiologist: Dr. Shirlee Latch Electrophysiologist: Dr. Graciela Husbands   Patient Profile:   Eric Murillo is a 56 y.o. male with a hx of chronic CHF (systolic), NICM, w/ICD, he underwent work-up for cardiac amyloid. SPEP negative. Fat pad biopsy 6/16 with benign adipose tissue, last cath Oct 2015 with no obstructive CAD. Unable to get cMRI due to size, HLD, OSA, obesity, PAFib, CRI (III) who is being seen today for the evaluation of AFlutter at the request of Dr. Shirlee Latch.  History of Present Illness:   Eric Murillo was admitted to [facility] yesterday with CHF exacerbation, noted in rate controlled atypical AFlutter, rhythm is thought to play a role in his HF, planned for TEE/DCCV today given missed Eliquis dose at home  August 2017 was evaluated by EP service with recurrent atypical AFlutter, at that time not on AAD, and felt rhythm management likely to be difficult given LA size 55mm, untreated sleep apnea and morbid obesity, ablation not felt to be ideal for this patient though if failed AAD therapy could be revisited, he was started on amiodarone during this stay and cardioverted.  The patient had about 3 days of increasing DOE, feeling mor then usual orthostatic dizziness as well.  No CP, had no palpitations or sensation of being out of rhythm, though this afternoon s/p DCCV, he feels like he does feel better in general.  LABS: K+ 4.0 BUN/Creat 63/3.33 WBC 12.4 H/H 12/40 plts 212  12/29/16 TSH 1.632  Device information: SJM, single chamber ICD, implanted 05/21/15, Dr. Graciela Husbands  AFlutter Hx Atypical, not felt amenable to ablation DCCV 04/03/14, August 2017 AAD: amiodarone started August 2017 a/c: Eliquis    Past Medical History:  Diagnosis Date  . AICD (automatic cardioverter/defibrillator) present   . Anemia of  chronic disease   . Arthritis   . Atrial flutter (HCC)    DCCV 9/15  . Chest pain on exertion   . Chronic systolic congestive heart failure, NYHA class 2 (HCC)    EF 20-25% 1/16  . CKD (chronic kidney disease) stage 3, GFR 30-59 ml/min (HCC)   . Dyslipidemia   . Gout    Of big toe  . Hyperglycemia   . Hyperlipidemia   . Hypertension   . Morbid obesity with BMI of 45.0-49.9, adult (HCC)   . Non-ischemic cardiomyopathy (HCC)   . Organic erectile dysfunction   . OSA (obstructive sleep apnea)   . Pilonidal cyst   . Submandibular sialolithiasis    Right    Past Surgical History:  Procedure Laterality Date  . CARDIAC CATHETERIZATION  2012  . COLONOSCOPY    . SALIVARY GLAND SURGERY  09/12/2012       Inpatient Medications: Scheduled Meds: . [MAR Hold] allopurinol  100 mg Oral BID  . [MAR Hold] amiodarone  200 mg Oral BID  . [MAR Hold] apixaban  5 mg Oral BID  . [MAR Hold] carvedilol  6.25 mg Oral BID WC  . [MAR Hold] isosorbide-hydrALAZINE  0.5 tablet Oral TID  . [MAR Hold] sodium chloride flush  3 mL Intravenous Q12H   Continuous Infusions: . sodium chloride 250 mL (06/05/17 1510)  . [MAR Hold] furosemide Stopped (06/06/17 1009)   PRN Meds: [MAR Hold] acetaminophen **OR** [MAR Hold] acetaminophen, butamben-tetracaine-benzocaine, [MAR Hold] senna-docusate, [MAR Hold] sodium chloride flush  Allergies:    Allergies  Allergen Reactions  . Nsaids  Other (See Comments)    Cannot take to due kidney issues  . Beta Adrenergic Blockers Other (See Comments)    "An unnamed, white-colored Beta Blocker made" him "feel funny" = made him feel cagey    Social History:   Social History   Socioeconomic History  . Marital status: Single    Spouse name: Not on file  . Number of children: Not on file  . Years of education: Not on file  . Highest education level: Not on file  Social Needs  . Financial resource strain: Not on file  . Food insecurity - worry: Not on file  . Food  insecurity - inability: Not on file  . Transportation needs - medical: Not on file  . Transportation needs - non-medical: Not on file  Occupational History  . Not on file  Tobacco Use  . Smoking status: Never Smoker  . Smokeless tobacco: Never Used  Substance and Sexual Activity  . Alcohol use: No    Alcohol/week: 0.0 oz  . Drug use: No  . Sexual activity: Not on file  Other Topics Concern  . Not on file  Social History Narrative   Financial assistance approved for 100% discount at [facility] and [facility]   [employer]orah Hill  March 16, 2010 9:42 AM      Lives with mother.  Has a girlfriend    Family History:    Family History  Problem Relation Age of Onset  . Hypertension Mother   . Hypertension Father   . Cancer Father        brother died of brain cancer; sister died of bone cancer  . Diabetes Sister   . Diabetes Brother   . Colon cancer Maternal Aunt   . Cardiomyopathy Neg Hx      ROS:  Please see the history of present illness.  Review of Systems  Endocrine: Positive for polyuria.    All other ROS reviewed and negative.     Physical Exam/Data:   Vitals:   06/05/17 2032 06/06/17 0043 06/06/17 0429 06/06/17 1106  BP: 122/75 (!) 98/54 130/87 124/68  Pulse: 60 (!) 58 (!) 57 63  Resp: 20 18 18 19   Temp: 97.7 F (36.5 C) 98.5 F (36.9 C) 98.6 F (37 C) 98.5 F (36.9 C)  TempSrc: Oral Oral Oral Oral  SpO2: 97% 98% 97% 100%  Weight: (!) 327 lb 12.8 oz (148.7 kg)  (!) 326 lb 8 oz (148.1 kg) (!) 326 lb (147.9 kg)  Height: 5' 9.5" (1.765 m)   5' 9.5" (1.765 m)    Intake/Output Summary (Last 24 hours) at 06/06/2017 1150 Last data filed at 06/06/2017 1049 Gross per 24 hour  Intake 600 ml  Output 1500 ml  Net -900 ml   Filed Weights   06/05/17 2032 06/06/17 0429 06/06/17 1106  Weight: (!) 327 lb 12.8 oz (148.7 kg) (!) 326 lb 8 oz (148.1 kg) (!) 326 lb (147.9 kg)   Body mass index is 47.45 kg/m.  General:  Well nourished,  Morbidly obese, well developed, in no  acute distress HEENT: normal Lymph: no adenopathy Neck: unable to assess JVD Vascular: No carotid bruits Cardiac:  RRR; sfot SM, no gallops or rubs Lungs:  Diminished at the bases, no wheezing, rhonchi or rales  Abd: soft, nontender, obese  Ext:  2+ edema Musculoskeletal:  No deformities Skin: warm and dry  Neuro:  No gross focal abnormalities noted Psych:  Normal affect    EKG:  The EKG  was personally reviewed and demonstrates:   #1 is AFlutter/ATach, IVCD,  68bpm, A rate 160bpm #2 AFlutter/ATach IVCD, 57bpm #3 AFlutter, 58bpm, A rate towards 250-300 #4 (today immediately post DDCV) looks like CHB, V rate 77, IVCD, QRS #5 SR, 72bpm, PR , QRS , QTc 575m Telemetry:  Telemetry was personally reviewed and demonstrates:   Rate controlled AFlutter >> SR 80's  Relevant CV Studies:  06/05/17: TTE Study Conclusions - Left ventricle: The cavity size was moderately dilated. Systolic   function was severely reduced. The estimated ejection fraction   was in the range of 20% to 25%. Diffuse hypokinesis. - Aortic valve: Transvalvular velocity was within the normal range.   There was no stenosis. There was no regurgitation. - Mitral valve: Calcified annulus. Transvalvular velocity was   within the normal range. There was no evidence for stenosis.   There was moderate regurgitation. - Right ventricle: The cavity size was mildly dilated. Wall   thickness was normal. Systolic function was mildly reduced. - Right atrium: The atrium was severely dilated. - Atrial septum: A patent foramen ovale cannot be excluded. - Tricuspid valve: There was no regurgitation. - Pulmonary arteries: Systolic pressure was severely increased. PA   peak pressure: 66 mm Hg (S).   08/31/14: lexiscan stress test Impression Exercise Capacity: Lexiscan with no exercise. BP Response: Normal blood pressure response. Clinical Symptoms: Chest pain ECG Impression: No significant ST segment change  suggestive of ischemia. Comparison with Prior Nuclear Study: No previous nuclear study performed Overall Impression: The study is not gated. Therefore I cannot assess the ejection fraction. With no ejection fraction data, I cannot give a risk calculation. The ventricle is dilated. There is no obvious large area of scar. There may be slight scar near the apex. There may be slight peri-infarct ischemia.    Laboratory Data:  Chemistry Recent Labs  Lab 06/04/17 2132 06/05/17 1137 06/06/17 0527  NA 140 140 142  K 4.1 3.7 4.0  CL 99* 100* 100*  CO2 29 29 30   GLUCOSE 114* 133* 104*  BUN 63* 62* 63*  CREATININE 4.02* 3.57* 3.33*  CALCIUM 8.8* 9.0 9.2  GFRNONAA 15* 18* 19*  GFRAA 18* 20* 22*  ANIONGAP 12 11 12     No results for input(s): PROT, ALBUMIN, AST, ALT, ALKPHOS, BILITOT in the last 168 hours. Hematology Recent Labs  Lab 06/04/17 2132  WBC 12.4*  RBC 4.30  HGB 12.2*  HCT 40.2  MCV 93.5  MCH 28.4  MCHC 30.3  RDW 15.5  PLT 212   Cardiac EnzymesNo results for input(s): TROPONINI in the last 168 hours.  Recent Labs  Lab 06/04/17 2223 06/05/17 0305  TROPIPOC 0.00 0.02    BNP Recent Labs  Lab 06/05/17 0301  BNP 672.6*    DDimer No results for input(s): DDIMER in the last 168 hours.  Radiology/Studies:   Dg Chest 2 View Result Date: 06/04/2017 CLINICAL DATA:  Acute onset of generalized chest discomfort, shortness of breath with exertion, cough and congestion. EXAM: CHEST  2 VIEW COMPARISON:  Chest radiograph performed 03/12/2017 FINDINGS: The lungs are well-aerated. Vascular congestion is noted. Increased interstitial markings may reflect mild interstitial edema. There is no evidence of pleural effusion or pneumothorax. The heart is mildly enlarged. An AICD is noted at the left chest wall, with a single lead ending at the right ventricle. No acute osseous abnormalities are seen. IMPRESSION: Vascular congestion and mild cardiomegaly. Increased interstitial markings  may reflect mild interstitial edema. Electronically Signed  By: Roanna Raider M.D.   On: 06/04/2017 21:47   US Renal Result Date: 06/05/2017 CLINICAL DATA:  Acute onset of renal insufficiency. Initial encounter. EXAM: RENAL / URINARY TRACT ULTRASOUND COMPLETE COMPARISON:  Renal ultrasound performed 12/06/2016 FINDINGS: Right Kidney: Length: 14.1 cm. Echogenicity within normal limits. No mass or hydronephrosis visualized. Left Kidney: Length: 12.6 cm. Echogenicity within normal limits. No mass or hydronephrosis visualized. Bladder: Decompressed and not well assessed. IMPRESSION: Unremarkable renal ultrasound.  No evidence of hydronephrosis. Electronically Signed   By: Roanna Raider M.D.   On: 06/05/2017 06:26    Assessment and Plan:   1. Acute/chronic CHF     NICM     Cumulatively fluid neg -     Patient feeling some improvement this afternoon  2. Atypical AFlutter, Atach, appears to have 2 different arrhythmias     LA size this echo is 23mm     Untreated OSA (described as severe, and intolerant of CPAP),      morbid obesity,  sleeps in a kneeling position     CHA2DS2Vasc is at least 2 on Eliquis  On amiodarone Eric Murillo need to review with Dr. Graciela Husbands, Eric Murillo se him this afternoon.   3. HTN 4. CKD 5. Morbid obesity 6. Untreated OSA    For questions or updates, please contact [facility] Please consult [email or web address] for contact info under Cardiology/STEMI.   Signed, Eric Pigeon, PA-C  06/06/2017 11:50 AM;  I have seen and examined this patient with Eric Murillo.  Agree with above, note added to reflect my findings.  On exam, RRR, no murmurs, lungs clear.  Patient presented to the hospital initially in heart failure and was found to be in atrial flutter.  Review of multiple EKG showed likely multiple atrial flutter circuits.  He had a cardioversion and is in sinus rhythm currently.  He is -4.2 L since admission.  It is possible that his atrial arrhythmia put him into heart  failure.  In reviewing his chart, he is not tachycardic when he is in atrial flutter.  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  He is currently on 200 mg twice a day of amiodarone.  I discussed with him the possibility of ablation for his atrial arrhythmias.  Unfortunately, his right atrium is severely dilated, and it is likely that his left atrium is moderate to severely dilated as well.  He is morbidly obese and has likely severe untreated sleep apnea.  All of these reduce his chances of a successful procedure.  I discussed with him effectively treating his sleep apnea as well as weight loss.  He Eric Murillo try both of these and we Eric Murillo discuss further in clinic.  Eric Murillo M. Jodeci Roarty MD 06/07/2017 1:47 PM

## 2017-06-06 NOTE — Anesthesia Postprocedure Evaluation (Signed)
Anesthesia Post Note  Patient: Eric Murillo  Procedure(s) Performed: TRANSESOPHAGEAL ECHOCARDIOGRAM (TEE) (N/A ) CARDIOVERSION (N/A )     Patient location during evaluation: Endoscopy Anesthesia Type: General Level of consciousness: awake and alert and oriented Pain management: pain level controlled Vital Signs Assessment: post-procedure vital signs reviewed and stable Respiratory status: spontaneous breathing, nonlabored ventilation, respiratory function stable and patient connected to nasal cannula oxygen Cardiovascular status: blood pressure returned to baseline and stable Postop Assessment: no apparent nausea or vomiting Anesthetic complications: no    Last Vitals:  Vitals:   06/06/17 1230 06/06/17 1301  BP: (!) 146/90 115/77  Pulse: 68 96  Resp: 20 18  Temp:  (!) 36.2 C  SpO2: 100% 94%    Last Pain:  Vitals:   06/06/17 1301  TempSrc: Oral  PainSc:                  Yeilyn Gent A.

## 2017-06-06 NOTE — Progress Notes (Signed)
Patient arrived to 3E13 from [facility]. Alert and oriented x 4. No complaints of pain. Afib/Aflutter on telemetry. VSS. Low fall risk. Skin dry and intact. Patient oriented to room and call system.

## 2017-06-06 NOTE — Evaluation (Signed)
Occupational Therapy Evaluation Patient Details Name: Eric KelchJasper Parilla MRN: [id number] DOB: [birth year] Today's Date: 06/06/2017    History of Present Illness Pt is a 56 y/o male admitted secondary to chest pain and SOB. Per MD notes, suspect CHF exacerbation. PMH includes OSA, HTN, CHF, CKD, a flutter, gout, non ischemic cardiomyopathy, and AICD   Clinical Impression   Patient evaluated by Occupational Therapy with no further acute OT needs identified. Pt able to complete all ADL at modified independent level requiring increased time due to chest tightness. He was able to complete functional mobility both in room and hallway during simulated ADL and IADL tasks without assistance. Did note slight instability on feet with fatigue and educated pt concerning energy conservation strategies and he demonstrates understanding. All education has been completed and the patient has no further questions. See below for any follow-up Occupational Therapy or equipment needs. OT to sign off. Thank you for referral.      Follow Up Recommendations  No OT follow up;Supervision - Intermittent    Equipment Recommendations  None recommended by OT    Recommendations for Other Services       Precautions / Restrictions Precautions Precautions: None Restrictions Weight Bearing Restrictions: No      Mobility Bed Mobility               General bed mobility comments: OOB in chair on my arrival.   Transfers Overall transfer level: Modified independent Equipment used: None             General transfer comment: Increased time    Balance Overall balance assessment: Needs assistance Sitting-balance support: No upper extremity supported;Feet supported Sitting balance-Leahy Scale: Good     Standing balance support: No upper extremity supported;During functional activity Standing balance-Leahy Scale: Fair                             ADL either performed or assessed with clinical  judgement   ADL Overall ADL's : Modified independent                                       General ADL Comments: Increased time due to chest tightness.      Vision Patient Visual Report: No change from baseline Vision Assessment?: No apparent visual deficits     Perception     Praxis      Pertinent Vitals/Pain Pain Assessment: Faces Faces Pain Scale: Hurts a little bit Pain Location: chest  Pain Descriptors / Indicators: Tightness Pain Intervention(s): Limited activity within patient's tolerance;Monitored during session;Repositioned     Hand Dominance Right   Extremity/Trunk Assessment Upper Extremity Assessment Upper Extremity Assessment: Overall WFL for tasks assessed   Lower Extremity Assessment Lower Extremity Assessment: Overall WFL for tasks assessed   Cervical / Trunk Assessment Cervical / Trunk Assessment: Normal   Communication Communication Communication: No difficulties   Cognition Arousal/Alertness: Awake/alert Behavior During Therapy: WFL for tasks assessed/performed Overall Cognitive Status: Within Functional Limits for tasks assessed                                     General Comments  No family present this session.     Exercises     Shoulder Instructions      Home Living Family/patient expects to  be discharged to:: Private residence Living Arrangements: Spouse/significant other;Parent Available Help at Discharge: Family;Available 24 hours/day Type of Home: House Home Access: Level entry     Home Layout: One level     Bathroom Shower/Tub: Chief Strategy Officer: Standard     Home Equipment: Bedside commode          Prior Functioning/Environment Level of Independence: Independent                 OT Problem List: Decreased activity tolerance;Impaired balance (sitting and/or standing)      OT Treatment/Interventions:      OT Goals(Current goals can be found in the care plan  section) Acute Rehab OT Goals Patient Stated Goal: to feel better  OT Goal Formulation: With patient Time For Goal Achievement: 06/13/17 Potential to Achieve Goals: Good  OT Frequency:     Barriers to D/C:            Co-evaluation              AM-PAC PT "6 Clicks" Daily Activity     Outcome Measure Help from another person eating meals?: None Help from another person taking care of personal grooming?: None Help from another person toileting, which includes using toliet, bedpan, or urinal?: None Help from another person bathing (including washing, rinsing, drying)?: None Help from another person to put on and taking off regular upper body clothing?: None Help from another person to put on and taking off regular lower body clothing?: None 6 Click Score: 24   End of Session    Activity Tolerance: Patient tolerated treatment well Patient left: in chair;with call bell/phone within reach  OT Visit Diagnosis: Other abnormalities of gait and mobility (R26.89)                Time: 1000-1013 OT Time Calculation (min): 13 min Charges:  OT General Charges $OT Visit: 1 Visit OT Evaluation $OT Eval Low Complexity: 1 Low G-Codes:     Doristine Section, MS OTR/L  Pager: [phone]   Reford Olliff A Delynda Sepulveda 06/06/2017, 10:16 AM

## 2017-06-06 NOTE — Care Management Note (Signed)
Case Management Note  Patient Details  Name: Eric Murillo MRN: [id number] Date of Birth: [birth year]  Subjective/Objective:   CHF                Action/Plan: PCP Darreld Mclean, MD; Medicaid with prescription; CM following for DCP  Expected Discharge Date:    possibly 06/08/2017              Expected Discharge Plan:  Home/Self Care  Discharge planning Services  CM Consult  Status of Service:  In process, will continue to follow  Cherrie Distance RN, MHA,BSN [phone] 06/06/2017, 1:06 PM

## 2017-06-06 NOTE — Progress Notes (Addendum)
  Patient noted to be in Mobitz type 1 2nd degree AV block post-procedure.  Will monitor, may have to cut back on beta blockade if this does not correct.  Will hold Coreg for now, continue amiodarone. If this resolves, go back onto Coreg.    [name] 06/06/2017  12:21 PM

## 2017-06-06 NOTE — H&P (View-Only) (Signed)
Patient ID: Hani Bladow, male   DOB: [birth year], 56 y.o.   MRN: [id number]     Advanced Heart Failure Rounding Note   Primary Cardiologist: Shirlee Latch  Subjective:    Patient remains in atypical atrial flutter, rate in 50s.  He did not diurese much yesterday, weight down 1 lb.  Creatinine down from 4 => 3.33.  Baseline 2.7.  Still dyspnea moving around room and orthopnea.   Echo: EF 20-25%, diffuse hypokinesis, moderately dilated LV, mildly decreased RV systolic function, PASP 66 mmHg.   Objective:   Weight Range: (!) 326 lb 8 oz (148.1 kg) Body mass index is 47.52 kg/m.   Vital Signs:   Temp:  [97.7 F (36.5 C)-98.6 F (37 C)] 98.6 F (37 C) (11/14 0429) Pulse Rate:  [52-85] 57 (11/14 0429) Resp:  [15-26] 18 (11/14 0429) BP: (93-130)/(54-87) 130/87 (11/14 0429) SpO2:  [89 %-100 %] 97 % (11/14 0429) Weight:  [326 lb 8 oz (148.1 kg)-327 lb 12.8 oz (148.7 kg)] 326 lb 8 oz (148.1 kg) (11/14 0429) Last BM Date: 06/04/17  Weight change: Filed Weights   06/05/17 0406 06/05/17 2032 06/06/17 0429  Weight: (!) 329 lb (149.2 kg) (!) 327 lb 12.8 oz (148.7 kg) (!) 326 lb 8 oz (148.1 kg)    Intake/Output:   Intake/Output Summary (Last 24 hours) at 06/06/2017 0814 Last data filed at 06/06/2017 0700 Gross per 24 hour  Intake 600 ml  Output 975 ml  Net -375 ml      Physical Exam    General:  Well appearing. No resp difficulty HEENT: Normal Neck: Supple. JVP 14-16 cm. Carotids 2+ bilat; no bruits. No lymphadenopathy or thyromegaly appreciated. Cor: PMI nonpalpable. Regular rate & rhythm. No rubs, gallops or murmurs. Lungs: Clear Abdomen: Soft, nontender, nondistended. No hepatosplenomegaly. No bruits or masses. Good bowel sounds. Extremities: No cyanosis, clubbing, rash.  2+ edema to knees bilaterally.  Neuro: Alert & orientedx3, cranial nerves grossly intact. moves all 4 extremities w/o difficulty. Affect pleasant   Telemetry   Atypical atrial flutter rate 50s-60s  (personally reviewed)   Labs    CBC Recent Labs    06/04/17 2132  WBC 12.4*  HGB 12.2*  HCT 40.2  MCV 93.5  PLT 212   Basic Metabolic Panel Recent Labs    03/07/47 1137 06/06/17 0527  NA 140 142  K 3.7 4.0  CL 100* 100*  CO2 29 30  GLUCOSE 133* 104*  BUN 62* 63*  CREATININE 3.57* 3.33*  CALCIUM 9.0 9.2   Liver Function Tests No results for input(s): AST, ALT, ALKPHOS, BILITOT, PROT, ALBUMIN in the last 72 hours. No results for input(s): LIPASE, AMYLASE in the last 72 hours. Cardiac Enzymes No results for input(s): CKTOTAL, CKMB, CKMBINDEX, TROPONINI in the last 72 hours.  BNP: BNP (last 3 results) Recent Labs    09/11/16 1221 10/05/16 1545 06/05/17 0301  BNP 343.2* 322.3* 672.6*    ProBNP (last 3 results) No results for input(s): PROBNP in the last 8760 hours.   D-Dimer No results for input(s): DDIMER in the last 72 hours. Hemoglobin A1C No results for input(s): HGBA1C in the last 72 hours. Fasting Lipid Panel No results for input(s): CHOL, HDL, LDLCALC, TRIG, CHOLHDL, LDLDIRECT in the last 72 hours. Thyroid Function Tests No results for input(s): TSH, T4TOTAL, T3FREE, THYROIDAB in the last 72 hours.  Invalid input(s): FREET3  Other results:   Imaging     No results found.   Medications:     Scheduled Medications: .  allopurinol  100 mg Oral BID  . amiodarone  200 mg Oral BID  . apixaban  5 mg Oral BID  . carvedilol  6.25 mg Oral BID WC  . metolazone  2.5 mg Oral Once  . sodium chloride flush  3 mL Intravenous Q12H     Infusions: . sodium chloride 250 mL (06/05/17 1510)  . furosemide Stopped (06/05/17 2016)     PRN Medications:  acetaminophen **OR** acetaminophen, senna-docusate, sodium chloride flush    Patient Profile   56 yo with history of nonischemic cardiomyopathy/chronic systolic CHF, CKD stage 3 baseline creatinine 2.7, and paroxymal atrial flutter presents with acute on chronic systolic CHF in setting of  recurrent atrial flutter.   Assessment/Plan   1. Acute on chronic systolic CHF: Nonischemic cardiomyopathy.  Echo this admission with stable EF 20-25%.  Associated AKI on CKD.  It is possible that CHF exacerbation was triggered by atrial flutter as it has been in the past.  He remains volume overloaded and did not diurese particularly well yesterday though creatinine is down some.  - Continue Lasix 120 mg IV bid.  - Add dose of metolazone 2.5 mg x 1.  - Coreg decreased to 6.25 mg bid.  - As long as BP remains stable, may start back on Bidil at lower dose (1/2 tab tid).  - Need to restore NSR.  2. AKI on CKD stage 3: Creatinine up to 4 at admission, now down to 3.33.  He is volume overloaded.  Hopefully as he diureses and renal venous pressure decreases, creatinine will come down towards his baseline (2.7).  Avoid hypotension.  3. Atrial flutter: Atypical, recurrent.  Occurred again despite amiodarone use.  He seems to tolerate atrial flutter poorly, has had CHF flares when he has had flutter in the past.  He has missed some recent Eliquis doses. Need to restore NSR given CHF.  - Continue Eliquis.  - Will arrange for TEE-guided DCCV today.   - Increase amiodarone to 200 mg bid for now.  - Will ask EP to see for ?flutter ablation for more permanent treatment.  He was seen by EP in the past, atypical flutter was not thought to be a straightforward ablation target.  Length of Stay: 1  Marca Anconaalton Jaiden Dinkins, MD  06/06/2017, 8:14 AM  Advanced Heart Failure Team Pager [phone] (M-F; 7a - 4p)  Please contact [facility] for night-coverage after hours (4p -7a ) and weekends on [email or web address]

## 2017-06-06 NOTE — Progress Notes (Signed)
  Echocardiogram Echocardiogram Transesophageal has been performed.  Eric Murillo 06/06/2017, 11:59 AM

## 2017-06-07 ENCOUNTER — Encounter (HOSPITAL_COMMUNITY): Payer: Self-pay | Admitting: Cardiology

## 2017-06-07 DIAGNOSIS — Z9889 Other specified postprocedural states: Secondary | ICD-10-CM

## 2017-06-07 LAB — BASIC METABOLIC PANEL
ANION GAP: 12 (ref 5–15)
BUN: 64 mg/dL — ABNORMAL HIGH (ref 6–20)
CHLORIDE: 98 mmol/L — AB (ref 101–111)
CO2: 33 mmol/L — ABNORMAL HIGH (ref 22–32)
Calcium: 9 mg/dL (ref 8.9–10.3)
Creatinine, Ser: 3.06 mg/dL — ABNORMAL HIGH (ref 0.61–1.24)
GFR calc Af Amer: 25 mL/min — ABNORMAL LOW (ref >60)
GFR, EST NON AFRICAN AMERICAN: 21 mL/min — AB (ref >60)
GLUCOSE: 91 mg/dL (ref 65–99)
POTASSIUM: 3.6 mmol/L (ref 3.5–5.1)
Sodium: 143 mmol/L (ref 135–145)

## 2017-06-07 LAB — CBC WITH DIFFERENTIAL/PLATELET
BASOS ABS: 0 10*3/uL (ref 0.0–0.1)
Basophils Relative: 0 %
EOS PCT: 2 %
Eosinophils Absolute: 0.2 10*3/uL (ref 0.0–0.7)
HEMATOCRIT: 38.8 % — AB (ref 39.0–52.0)
HEMOGLOBIN: 11.6 g/dL — AB (ref 13.0–17.0)
LYMPHS ABS: 1.9 10*3/uL (ref 0.7–4.0)
LYMPHS PCT: 16 %
MCH: 28 pg (ref 26.0–34.0)
MCHC: 29.9 g/dL — ABNORMAL LOW (ref 30.0–36.0)
MCV: 93.5 fL (ref 78.0–100.0)
MONOS PCT: 13 %
Monocytes Absolute: 1.6 10*3/uL — ABNORMAL HIGH (ref 0.1–1.0)
NEUTROS ABS: 8.4 10*3/uL — AB (ref 1.7–7.7)
Neutrophils Relative %: 69 %
Platelets: 223 10*3/uL (ref 150–400)
RBC: 4.15 MIL/uL — AB (ref 4.22–5.81)
RDW: 15.4 % (ref 11.5–15.5)
WBC: 12.1 10*3/uL — ABNORMAL HIGH (ref 4.0–10.5)

## 2017-06-07 LAB — MAGNESIUM: Magnesium: 2.7 mg/dL — ABNORMAL HIGH (ref 1.7–2.4)

## 2017-06-07 MED ORDER — POTASSIUM CHLORIDE CRYS ER 20 MEQ PO TBCR
40.0000 meq | EXTENDED_RELEASE_TABLET | Freq: Once | ORAL | Status: AC
  Administered 2017-06-07: 40 meq via ORAL
  Filled 2017-06-07: qty 2

## 2017-06-07 MED ORDER — CARVEDILOL 3.125 MG PO TABS
3.1250 mg | ORAL_TABLET | Freq: Two times a day (BID) | ORAL | Status: DC
  Administered 2017-06-07 – 2017-06-08 (×2): 3.125 mg via ORAL
  Filled 2017-06-07 (×2): qty 1

## 2017-06-07 MED ORDER — ISOSORB DINITRATE-HYDRALAZINE 20-37.5 MG PO TABS
1.0000 | ORAL_TABLET | Freq: Three times a day (TID) | ORAL | Status: DC
  Administered 2017-06-07 – 2017-06-11 (×13): 1 via ORAL
  Filled 2017-06-07 (×13): qty 1

## 2017-06-07 MED ORDER — METOLAZONE 2.5 MG PO TABS
2.5000 mg | ORAL_TABLET | Freq: Once | ORAL | Status: AC
  Administered 2017-06-07: 2.5 mg via ORAL
  Filled 2017-06-07: qty 1

## 2017-06-07 NOTE — Progress Notes (Signed)
Physical Therapy Treatment Patient Details Name: Eric Murillo MRN: [id number] DOB: 03/25/1961 Today's Date: 06/07/2017    History of Present Illness Pt is a 56 y/o male admitted secondary to chest pain and SOB. Per MD notes, suspect CHF exacerbation. PMH includes OSA, HTN, CHF, CKD, a flutter, gout, non ischemic cardiomyopathy, and AICD     PT Comments    Patient making good gains with mobility and gait.  Able to maintain balance during high level balance activities. No DOE noted today.  Encouraged patient to ambulate in hallway with supervision several times during day.   Follow Up Recommendations  No PT follow up;Supervision for mobility/OOB     Equipment Recommendations  None recommended by PT    Recommendations for Other Services       Precautions / Restrictions Precautions Precautions: None Restrictions Weight Bearing Restrictions: No    Mobility  Bed Mobility               General bed mobility comments: Patient in chair as PT entered room  Transfers Overall transfer level: Modified independent Equipment used: None                Ambulation/Gait Ambulation/Gait assistance: Supervision Ambulation Distance (Feet): 120 Feet Assistive device: None Gait Pattern/deviations: Step-through pattern;Wide base of support Gait velocity: Decreased Gait velocity interpretation: Below normal speed for age/gender General Gait Details: Patient with good balance during gait.  Performed balance testing.     Stairs            Wheelchair Mobility    Modified Rankin (Stroke Patients Only)       Balance           Standing balance support: No upper extremity supported;During functional activity Standing balance-Leahy Scale: Good   Single Leg Stance - Right Leg: 6 Single Leg Stance - Left Leg: 8 Tandem Stance - Right Leg: 23 Tandem Stance - Left Leg: 26 Rhomberg - Eyes Opened: 30 Rhomberg - Eyes Closed: 30(minimal sway) High level balance  activites: Backward walking;Direction changes;Turns;Sudden stops;Head turns High Level Balance Comments: No loss of balance during high level balance activities.            Cognition Arousal/Alertness: Awake/alert Behavior During Therapy: WFL for tasks assessed/performed Overall Cognitive Status: Within Functional Limits for tasks assessed                                        Exercises      General Comments        Pertinent Vitals/Pain Pain Assessment: No/denies pain    Home Living                      Prior Function            PT Goals (current goals can now be found in the care plan section) Progress towards PT goals: Progressing toward goals    Frequency    Min 3X/week      PT Plan Current plan remains appropriate    Co-evaluation              AM-PAC PT "6 Clicks" Daily Activity  Outcome Measure  Difficulty turning over in bed (including adjusting bedclothes, sheets and blankets)?: A Little Difficulty moving from lying on back to sitting on the side of the bed? : A Little Difficulty sitting down on and standing up from a chair  with arms (e.g., wheelchair, bedside commode, etc,.)?: None Help needed moving to and from a bed to chair (including a wheelchair)?: None Help needed walking in hospital room?: None Help needed climbing 3-5 steps with a railing? : A Little 6 Click Score: 21    End of Session Equipment Utilized During Treatment: Gait belt Activity Tolerance: Patient tolerated treatment well Patient left: in chair;with call bell/phone within reach;with nursing/sitter in room Nurse Communication: Mobility status PT Visit Diagnosis: Other abnormalities of gait and mobility (R26.89);Unsteadiness on feet (R26.81)     Time: [id number] PT Time Calculation (min) (ACUTE ONLY): 10 min  Charges:  $Gait Training: 8-22 mins                    G Codes:       Durenda HurtSusan H. Renaldo Fiddleravis, PT, [facility] Acute Rehab Services Pager  [phone]    Vena AustriaSusan H Clatie Kessen 06/07/2017, 12:09 PM

## 2017-06-07 NOTE — Progress Notes (Signed)
Pt with 15 beats of V tach this AM. Pt asymptomatic. EKG shows 1st degree heart block. VS stable. MD paged. Magnesium level added to AM labs. Will continue to monitor.

## 2017-06-07 NOTE — Progress Notes (Addendum)
Patient ID: Eric Murillo, male   DOB: 12/04/1960, 56 y.o.   MRN: [id number]     Advanced Heart Failure Rounding Note   Primary Cardiologist: Shirlee Latch  Subjective:    Patient was admitted with atypical flutter and underwent TEE-guided DCCV on 11/14.  He is in NSR with long 1st degree AV block 308 msec this morning by ECG.  Of note, he had type 1 2nd degree AV block immediately after DCCV.  He also appears to have 2 P wave morphologies when not in atrial flutter, likely has an ectopic atrial focus as well. We held his Coreg with the type 1 2nd degree AV block.   He diuresed yesterday though not markedly.  Feeling less short of breath walking down the hall.   Echo: EF 20-25%, diffuse hypokinesis, moderately dilated LV, mildly decreased RV systolic function, PASP 66 mmHg.   TEE: EF 25-30% with mildly dilated LV and diffuse hypokinesis, moderately decreased RV systolic function, mild-moderate TR.   Objective:   Weight Range: (!) 326 lb (147.9 kg) Body mass index is 47.45 kg/m.   Vital Signs:   Temp:  [97.2 F (36.2 C)-98.5 F (36.9 C)] 98.2 F (36.8 C) (11/15 0456) Pulse Rate:  [63-96] 70 (11/15 0456) Resp:  [18-20] 18 (11/15 0456) BP: (109-146)/(68-90) 126/78 (11/15 0456) SpO2:  [93 %-100 %] 98 % (11/15 0456) Weight:  [326 lb (147.9 kg)] 326 lb (147.9 kg) (11/14 1106) Last BM Date: 06/06/17  Weight change: Filed Weights   06/05/17 2032 06/06/17 0429 06/06/17 1106  Weight: (!) 327 lb 12.8 oz (148.7 kg) (!) 326 lb 8 oz (148.1 kg) (!) 326 lb (147.9 kg)    Intake/Output:   Intake/Output Summary (Last 24 hours) at 06/07/2017 0739 Last data filed at 06/07/2017 0457 Gross per 24 hour  Intake 990 ml  Output 2730 ml  Net -1740 ml      Physical Exam    General: NAD, obese Neck: JVP 14 cm, no thyromegaly or thyroid nodule.  Lungs: Clear to auscultation bilaterally with normal respiratory effort. CV: nonpalpable PMI.  Heart regular S1/S2, no S3/S4, no murmur.  1+ edema to  knees.   Abdomen: Soft, nontender, no hepatosplenomegaly, no distention.  Skin: Intact without lesions or rashes.  Neurologic: Alert and oriented x 3.  Psych: Normal affect. Extremities: No clubbing or cyanosis.  HEENT: Normal.    Telemetry   NSR with long 1st degree AV block   Labs    CBC Recent Labs    06/04/17 2132 06/07/17 0613  WBC 12.4* 12.1*  NEUTROABS  --  PENDING  HGB 12.2* 11.6*  HCT 40.2 38.8*  MCV 93.5 93.5  PLT 212 223   Basic Metabolic Panel Recent Labs    05/25/10 1137 06/06/17 0527  NA 140 142  K 3.7 4.0  CL 100* 100*  CO2 29 30  GLUCOSE 133* 104*  BUN 62* 63*  CREATININE 3.57* 3.33*  CALCIUM 9.0 9.2   Liver Function Tests No results for input(s): AST, ALT, ALKPHOS, BILITOT, PROT, ALBUMIN in the last 72 hours. No results for input(s): LIPASE, AMYLASE in the last 72 hours. Cardiac Enzymes No results for input(s): CKTOTAL, CKMB, CKMBINDEX, TROPONINI in the last 72 hours.  BNP: BNP (last 3 results) Recent Labs    09/11/16 1221 10/05/16 1545 06/05/17 0301  BNP 343.2* 322.3* 672.6*    ProBNP (last 3 results) No results for input(s): PROBNP in the last 8760 hours.   D-Dimer No results for input(s): DDIMER in the last  72 hours. Hemoglobin A1C No results for input(s): HGBA1C in the last 72 hours. Fasting Lipid Panel No results for input(s): CHOL, HDL, LDLCALC, TRIG, CHOLHDL, LDLDIRECT in the last 72 hours. Thyroid Function Tests No results for input(s): TSH, T4TOTAL, T3FREE, THYROIDAB in the last 72 hours.  Invalid input(s): FREET3  Other results:   Imaging    No results found.   Medications:     Scheduled Medications: . allopurinol  100 mg Oral BID  . amiodarone  200 mg Oral BID  . apixaban  5 mg Oral BID  . isosorbide-hydrALAZINE  0.5 tablet Oral TID  . metolazone  2.5 mg Oral Once  . sodium chloride flush  3 mL Intravenous Q12H    Infusions: . sodium chloride Stopped (06/06/17 1257)  . sodium chloride 250 mL  (06/05/17 1510)  . furosemide Stopped (06/06/17 2125)    PRN Medications: acetaminophen **OR** acetaminophen, ondansetron (ZOFRAN) IV, senna-docusate, sodium chloride flush    Patient Profile   56 yo with history of nonischemic cardiomyopathy/chronic systolic CHF, CKD stage 3 baseline creatinine 2.7, and paroxymal atrial flutter presents with acute on chronic systolic CHF in setting of recurrent atrial flutter.   Assessment/Plan   1. Acute on chronic systolic CHF: Nonischemic cardiomyopathy.  Echo this admission with stable EF 20-25%.  Associated AKI on CKD.  It is possible that CHF exacerbation was triggered by atrial flutter as it has been in the past.  He is now out of atrial fibrillation.  He had some diuresis yesterday but still volume overloaded. - Continue Lasix 120 mg IV bid.  - Add dose of metolazone 2.5 mg x 1 again as long as creatinine/K are stable when BMET returns.  - Coreg held with type 1 2nd degree AVB and long 1st degree AV block.  Will eventually try to start back on low dose Coreg 3.125 mg bid.  - Can increase Bidil to 1 tab tid with stable BP.  2. AKI on CKD stage 3: Creatinine up to 4 at admission, 3.33 yesterday.  He is volume overloaded.  Hopefully as he diureses and renal venous pressure decreases, creatinine will come down towards his baseline (2.7).  Avoid hypotension.  3. Atrial flutter: Atypical, recurrent.  Occurred again despite amiodarone use.  He seems to tolerate atrial flutter poorly, has had CHF flares when he has had flutter in the past.  TEE-DCCV on 11/14, in NSR this morning.  Interestingly, when he is out of flutter he appears to have 2 different P waves morphologies so appears to have an ectopic atrial focus as well.  He has a long 1st degree AVB and had type 1 2nd degree AVB after DCCV yesterday.  - Continue Eliquis.  - Continue amiodarone 200 mg bid for a few days then back to 200 mg daily.  - Coreg currently held with heart block (type 1 2nd degree  and long 1st degree).  Would like to eventually restart at least a low dose.  - EP has seen for ?flutter ablation for more permanent treatment.  He was seen by EP in the past, atypical flutter was not thought to be a straightforward ablation target. Awaiting opinion.  4. OSA: Severe. Has not tolerated CPAP.  Potentiates atrial arrhythmias.   Length of Stay: 2  Marca Anconaalton Traci Plemons, MD  06/07/2017, 7:39 AM  Advanced Heart Failure Team Pager [phone] (M-F; 7a - 4p)  Please contact [facility] for night-coverage after hours (4p -7a ) and weekends on [email or web address]

## 2017-06-07 NOTE — Progress Notes (Signed)
   Subjective: No acute events overnight. The pt tolerated TEE and cardioversion yesterday without complication. He was noted to be in 2nd degree AV block (type 1) following procedure.  He states his breathing has continued to improve, making good urine (-1.9 L last 24 hours), with mild improvement in his edema.  He walked in the hallway last night with improved exercise tolerance compared to presentation.  Objective:  Vital signs in last 24 hours: Vitals:   06/07/17 0100 06/07/17 0456 06/07/17 0800 06/07/17 1016  BP: 109/87 126/78 (!) 122/91   Pulse: 74 70 70   Resp:  18    Temp: 97.8 F (36.6 C) 98.2 F (36.8 C) 97.6 F (36.4 C)   TempSrc: Oral Oral Oral   SpO2: 96% 98%    Weight:    (!) 321 lb 11.2 oz (145.9 kg)  Height:       General: Obese male, sitting in chair comfortably, no acute distress CV: AICD L chest wall, no murmur, normal rate  Resp: Clear breath sounds, normal work of breathing, no distress  Abd: Soft, +BS, obese with edema/tightness  Extr: Interval improvement to pitting edema to bilateral LEs  Neuro: Alert and oriented x3 Skin: Warm, dry, acrochordons to neck      Assessment/Plan:  HFrEF exacerbation (EF 25-30%, s/p ICD) Patient presented with progressive chest tightness and dyspnea on exertion with increased swelling and edema on chest x-ray in the setting of nonadherence with fluid restriction missed metolazone dose.  Estimated dry weight per patient around 320 pounds, currently 329.  He was started on IV diuresis. Cardiology consulted, appreciate recommendations. He is now s/p cardioversion as below which should improve his HF and volume status.  --Cont IV Lasix 120 mg BID with metolazone 2.5 mg today --Strict I/Os, daily weights, fluid and salt restriction --Cont home Bidil TID   H/o Atrial Flutter s/p cardioversion (2017) Patient has a history of atrial flutter and cardioversion performed in 2017 and was found to be in atrial flutter on admission, likely  contributing to his HF exacerbation. Cardiology performed TEE with successful cardioversion, noted to be in Type 1 2nd degree AV block following and coreg was held. This has resolved, though he has a 1st degree AV block this morning. Appreciate cardiology/EP assistance in management.    --Tele --Cont Amiodarone to 200 mg BID  --Cont Eliquis  --Hold coreg    Acute on Chronic Kidney Injury (CKD IV)  Patient has a history of chronic kidney disease stage IV with baseline creatinine around 2.7-2.9.  Creatinine on admission elevated to 4.0.  Renal ultrasound on admission without obstruction.  Likely due to volume overload and has improved with diuresis, currently 3.06.   --BMP, replete electrolytes prn --Cont diuresis as above  Dispo: Anticipated discharge in approximately 1-2 day(s).   Ginger Carne, MD 06/07/2017, 11:03 AM Pager: [phone]

## 2017-06-08 DIAGNOSIS — I440 Atrioventricular block, first degree: Secondary | ICD-10-CM

## 2017-06-08 LAB — BASIC METABOLIC PANEL
Anion gap: 11 (ref 5–15)
BUN: 56 mg/dL — AB (ref 6–20)
CALCIUM: 8.8 mg/dL — AB (ref 8.9–10.3)
CO2: 35 mmol/L — AB (ref 22–32)
CREATININE: 2.96 mg/dL — AB (ref 0.61–1.24)
Chloride: 96 mmol/L — ABNORMAL LOW (ref 101–111)
GFR calc non Af Amer: 22 mL/min — ABNORMAL LOW (ref >60)
GFR, EST AFRICAN AMERICAN: 26 mL/min — AB (ref >60)
GLUCOSE: 102 mg/dL — AB (ref 65–99)
Potassium: 3.1 mmol/L — ABNORMAL LOW (ref 3.5–5.1)
Sodium: 142 mmol/L (ref 135–145)

## 2017-06-08 MED ORDER — CARVEDILOL 6.25 MG PO TABS
6.2500 mg | ORAL_TABLET | Freq: Two times a day (BID) | ORAL | Status: DC
  Administered 2017-06-08 – 2017-06-11 (×6): 6.25 mg via ORAL
  Filled 2017-06-08 (×6): qty 1

## 2017-06-08 MED ORDER — CARVEDILOL 3.125 MG PO TABS
3.1250 mg | ORAL_TABLET | Freq: Two times a day (BID) | ORAL | Status: DC
Start: 2017-06-08 — End: 2017-06-08

## 2017-06-08 MED ORDER — CARVEDILOL 6.25 MG PO TABS: 6.2500 mg | ORAL_TABLET | Freq: Two times a day (BID) | ORAL | Status: DC

## 2017-06-08 MED ORDER — CARVEDILOL 3.125 MG PO TABS
3.1250 mg | ORAL_TABLET | Freq: Once | ORAL | Status: AC
  Administered 2017-06-08: 3.125 mg via ORAL
  Filled 2017-06-08: qty 1

## 2017-06-08 MED ORDER — POTASSIUM CHLORIDE CRYS ER 20 MEQ PO TBCR
40.0000 meq | EXTENDED_RELEASE_TABLET | Freq: Two times a day (BID) | ORAL | Status: AC
  Administered 2017-06-08 – 2017-06-09 (×3): 40 meq via ORAL
  Filled 2017-06-08 (×3): qty 2

## 2017-06-08 MED ORDER — METOLAZONE 2.5 MG PO TABS
2.5000 mg | ORAL_TABLET | Freq: Once | ORAL | Status: AC
  Administered 2017-06-08: 2.5 mg via ORAL
  Filled 2017-06-08: qty 1

## 2017-06-08 NOTE — Progress Notes (Signed)
Ortho Tech in and applied [employer] to bilateral lower extremities.  Tech stated the foam dressing is to be left on (lower left leg), with Unna boots over dressing.  Patient updated on plan of care.

## 2017-06-08 NOTE — Progress Notes (Signed)
   Subjective: No acute events overnight. Pt has continued to have good urine output with 2 L over last 24 hours, net negative 6.2 L since admission and weight is down to 316 lbs.    Objective:  Vital signs in last 24 hours: Vitals:   06/07/17 1507 06/07/17 2013 06/08/17 0525 06/08/17 0948  BP: 115/78 106/72 127/76 (!) 104/56  Pulse: 73 73 70 73  Resp: 18 18    Temp: 98.4 F (36.9 C)  99.2 F (37.3 C)   TempSrc: Oral  Oral   SpO2: 96% 95% 97%   Weight:  (!) 318 lb 12.8 oz (144.6 kg) (!) 316 lb 12.8 oz (143.7 kg)   Height:       General: Obese male, sitting in chair comfortably, no acute distress CV: AICD L chest wall, no murmur, normal rate  Resp: Clear breath sounds, normal work of breathing, no distress  Abd: Soft, +BS, obese and soft after improvement in edema  Extr: Continued interval improvement to pitting edema to bilateral LEs  Neuro: Alert and oriented x3 Skin: Warm, dry, acrochordons to neck      Assessment/Plan:  HFrEF exacerbation (EF 25-30%, s/p ICD) Patient presented with progressive chest tightness and dyspnea on exertion with increased swelling and edema on chest x-ray in the setting of nonadherence with fluid restriction, missed metolazone dose.  Estimated dry weight per patient around 320 pounds, weight has improved.  He has received IV diuresis wotj good response. Cardiology consulted, appreciate recommendations. He is now s/p cardioversion as below which should improve his HF and volume status.  --Cont IV Lasix 120 mg BID with metolazone 2.5 mg today, plan to transition to oral regimen tomorrow  --Strict I/Os, daily weights, fluid and salt restriction --Cont home Bidil TID --Unna boots to LE    H/o Atrial Flutter s/p cardioversion (2017) Patient has a history of atrial flutter and cardioversion performed in 2017 and was found to be in atrial flutter on admission, likely contributing to his HF exacerbation. Cardiology performed TEE with successful  cardioversion, noted to be in Type 1 2nd degree AV block following and coreg was held. This has resolved, though he has persistent 1st degree AV block. Appreciate cardiology/EP assistance in management. He is on amiodarone at home, will continue BID dosing for seven days and resume daily dosing. EP evaluated, the pt needs improved OSA tx and weight loss prior to consideration for ablation therapies.    --Tele --Cont Amiodarone to 200 mg BID  --Cont Eliquis  --Coreg 6.25 mg BID   Acute on Chronic Kidney Injury (CKD IV)  Patient has a history of chronic kidney disease stage IV with baseline creatinine around 2.7-2.9.  Creatinine on admission elevated to 4.0.  Renal ultrasound on admission without obstruction.  Likely due to volume overload and has improved with diuresis, currently 2.96, c/w his baseline.  --BMP, replete electrolytes prn --Cont diuresis as above  OSA Pt has a history of OSA that is not currently treated as he has not tolerated CPAP mask in the past. At home he reports sleeping in a kneeling position. His uncontrolled OSA is likely contributing to his comorbidities and may preclude ablation as described above.  --CPAP per RT   Dispo: Anticipated discharge in approximately 1-2 day(s).   Ginger Carne, MD 06/08/2017, 10:09 AM Pager: [phone]

## 2017-06-08 NOTE — Progress Notes (Signed)
Orthopedic Tech Progress Note Patient Details:  Eric Murillo [birth year] [id number]  Ortho Devices Type of Ortho Device: Roland Rack boot Ortho Device/Splint Location: bilateral Ortho Device/Splint Interventions: Application   Eric Murillo 06/08/2017, 10:16 AM

## 2017-06-08 NOTE — Progress Notes (Addendum)
Patient ID: Eric Murillo, male   DOB: 1961/05/04, 56 y.o.   MRN: [id number]     Advanced Heart Failure Rounding Note   Primary Cardiologist: Shirlee LatchMcLean  Subjective:    Patient was admitted with atypical flutter and underwent TEE-guided DCCV on 11/14.  He remains in NSR today with long 1st degree AVB.  Of note, he had type 1 2nd degree AV block immediately after DCCV.  He also appears to have 2 P wave morphologies when not in atrial flutter, likely has an ectopic atrial focus as well. We initially held his Coreg with the type 1 2nd degree AV block.   Run of NSVT noted 11/15.   EP consulted and recommend OSA treatment and weight loss.  Has CPAP at home.   Yesterday diuresed with IV lasix + metolazone. Brisk diuresis noted. Weight down another 2 pounds.   Mild dyspnea with exertion. Denies CP.   Echo: EF 20-25%, diffuse hypokinesis, moderately dilated LV, mildly decreased RV systolic function, PASP 66 mmHg.   TEE: EF 25-30% with mildly dilated LV and diffuse hypokinesis, moderately decreased RV systolic function, mild-moderate TR.   Objective:   Weight Range: (!) 316 lb 12.8 oz (143.7 kg) Body mass index is 46.11 kg/m.   Vital Signs:   Temp:  [98.4 F (36.9 C)-99.2 F (37.3 C)] 99.2 F (37.3 C) (11/16 0525) Pulse Rate:  [70-73] 70 (11/16 0525) Resp:  [18] 18 (11/15 2013) BP: (106-127)/(72-78) 127/76 (11/16 0525) SpO2:  [95 %-97 %] 97 % (11/16 0525) Weight:  [316 lb 12.8 oz (143.7 kg)-321 lb 11.2 oz (145.9 kg)] 316 lb 12.8 oz (143.7 kg) (11/16 0525) Last BM Date: 06/06/17  Weight change: Filed Weights   06/07/17 1016 06/07/17 2013 06/08/17 0525  Weight: (!) 321 lb 11.2 oz (145.9 kg) (!) 318 lb 12.8 oz (144.6 kg) (!) 316 lb 12.8 oz (143.7 kg)    Intake/Output:   Intake/Output Summary (Last 24 hours) at 06/08/2017 0806 Last data filed at 06/08/2017 0525 Gross per 24 hour  Intake 1552 ml  Output 4550 ml  Net -2998 ml      Physical Exam    General:  Appears  chronically ill.  No resp difficulty HEENT: normal Neck: supple. JVP 10-12. Carotids 2+ bilat; no bruits. No lymphadenopathy or thryomegaly appreciated. Cor: PMI nondisplaced. Regular rate & rhythm. No rubs, gallops or murmurs. Lungs: Clear . On room air.  Abdomen: soft, nontender, distended. No hepatosplenomegaly. No bruits or masses. Good bowel sounds. Extremities: no cyanosis, clubbing, rash, R and LLE 1-2+ edema Neuro: alert & orientedx3, cranial nerves grossly intact. moves all 4 extremities w/o difficulty. Affect pleasant   Telemetry   NSR 70s, 1st degree AVB.     Labs    CBC Recent Labs    06/07/17 0613  WBC 12.1*  NEUTROABS 8.4*  HGB 11.6*  HCT 38.8*  MCV 93.5  PLT 223   Basic Metabolic Panel Recent Labs    65/78/4611/15/18 0613 06/08/17 0413  NA 143 142  K 3.6 3.1*  CL 98* 96*  CO2 33* 35*  GLUCOSE 91 102*  BUN 64* 56*  CREATININE 3.06* 2.96*  CALCIUM 9.0 8.8*  MG 2.7*  --    Liver Function Tests No results for input(s): AST, ALT, ALKPHOS, BILITOT, PROT, ALBUMIN in the last 72 hours. No results for input(s): LIPASE, AMYLASE in the last 72 hours. Cardiac Enzymes No results for input(s): CKTOTAL, CKMB, CKMBINDEX, TROPONINI in the last 72 hours.  BNP: BNP (last 3 results) Recent  Labs    09/11/16 1221 10/05/16 1545 06/05/17 0301  BNP 343.2* 322.3* 672.6*    ProBNP (last 3 results) No results for input(s): PROBNP in the last 8760 hours.   D-Dimer No results for input(s): DDIMER in the last 72 hours. Hemoglobin A1C No results for input(s): HGBA1C in the last 72 hours. Fasting Lipid Panel No results for input(s): CHOL, HDL, LDLCALC, TRIG, CHOLHDL, LDLDIRECT in the last 72 hours. Thyroid Function Tests No results for input(s): TSH, T4TOTAL, T3FREE, THYROIDAB in the last 72 hours.  Invalid input(s): FREET3  Other results:   Imaging    No results found.   Medications:     Scheduled Medications: . allopurinol  100 mg Oral BID  . amiodarone   200 mg Oral BID  . apixaban  5 mg Oral BID  . carvedilol  3.125 mg Oral BID WC  . isosorbide-hydrALAZINE  1 tablet Oral TID  . potassium chloride  40 mEq Oral BID  . sodium chloride flush  3 mL Intravenous Q12H    Infusions: . sodium chloride Stopped (06/06/17 1257)  . sodium chloride 250 mL (06/05/17 1510)  . furosemide Stopped (06/07/17 1821)    PRN Medications: acetaminophen **OR** acetaminophen, ondansetron (ZOFRAN) IV, senna-docusate, sodium chloride flush    Patient Profile   56 yo with history of nonischemic cardiomyopathy/chronic systolic CHF, CKD stage 3 baseline creatinine 2.7, and paroxymal atrial flutter presents with acute on chronic systolic CHF in setting of recurrent atrial flutter.   Assessment/Plan   1. Acute on chronic systolic CHF: Nonischemic cardiomyopathy.  Echo this admission with stable EF 20-25%.  Associated AKI on CKD.  It is possible that CHF exacerbation was triggered by atrial flutter as it has been in the past, now in NSR after DCCV. BP stable.  - Volume status slowly improving. Continue IV lasix 120 mg bid today and give another dose 2.5 mg metolazone.  - Coreg held with type 1 2nd degree AVB and long 1st degree AV block.   - He was restarted on Coreg 3.125 mg bid yesterday, increase to 6.25 mg bid today.  - Continue Bidil 1 tab tid  2. AKI on CKD stage 3: Creatinine up to 4 at admission, 2.96 today (coming down towards baseline).   3. Atrial flutter: Atypical, recurrent.  Occurred again despite amiodarone use.  He seems to tolerate atrial flutter poorly, has had CHF flares when he has had flutter in the past.  TEE-DCCV on 11/14, in NSR this morning.  Interestingly, when he is out of flutter he appears to have 2 different P waves morphologies so appears to have an ectopic atrial focus as well.  He has a long 1st degree AVB and had type 1 2nd degree AVB after DCCV 11/14.  Maintaining NSR.  - Continue Eliquis.  - Continue amiodarone 200 mg bid for a few  days then back to 200 mg daily.  - Coreg restarted, no further type 1 2nd degree AVB.  Will increase to 6.25 mg bid.   - EP has seen for ?flutter ablation for more permanent treatment => think he would not be very likely to hold long-term, recommend CPAP and weight loss to start with, will followup with him in EP clinic.    4. OSA: Severe.  Potentiates atrial arrhythmias.  - Retry CPAP.   Length of Stay: 3  Amy Clegg, NP  06/08/2017, 8:06 AM  Advanced Heart Failure Team Pager [phone] (M-F; 7a - 4p)  Please contact [facility] for night-coverage  after hours (4p -7a ) and weekends on [email or web address]  Patient seen with NP, agree with the above note.  Still with some volume overload, continue IV Lasix + metolazone dose today.  Possibly back to po tomorrow (torsemide 80 mg po bid + metolazone once a week).    He is maintaining NSR with 1st degree AVB.  Continue amiodarone bid x 7 days then back to daily.  Followup with EP as outpatient.   He will need to retry CPAP, will order tonight in hospital.   Can increase Coreg back to 6.25 mg bid.   Creatinine trending down towards baseline.   [name] 06/08/2017 8:37 AM

## 2017-06-08 NOTE — Plan of Care (Signed)
Reviewed [facility] With Heart Failure Booklet

## 2017-06-08 NOTE — Progress Notes (Signed)
Patient has diuresed well - had Zaroxylyn po dose today and continues on Lasix 120mg  IVPB bid.  Zone tool for HF reviewed with patient and he stated understanding.

## 2017-06-08 NOTE — Discharge Summary (Signed)
Name: Eric Murillo MRN: [id number] DOB: 11/19/1960 56 y.o. PCP: Darreld Mclean, MD  Date of Admission: 06/05/2017  2:05 AM Date of Discharge: 06/11/2017 Attending Physician: Dr. Earl Lagos   Discharge Diagnosis: Principal Problem:   Acute on chronic combined systolic (congestive) and diastolic (congestive) heart failure (HCC) Active Problems:   Morbid obesity with BMI of 45.0-49.9, adult (HCC)   Cardiomyopathy, nonischemic (HCC)   OSA (obstructive sleep apnea)   Atrial flutter (HCC)   AKI (acute kidney injury) (HCC)   Discharge Medications: Allergies as of 06/11/2017      Reactions   Nsaids Other (See Comments)   Cannot take to due kidney issues   Beta Adrenergic Blockers Other (See Comments)   "An unnamed, white-colored Beta Blocker made" him "feel funny" = made him feel cagey      Medication List    STOP taking these medications   levofloxacin 500 MG tablet Commonly known as:  LEVAQUIN   spironolactone 25 MG tablet Commonly known as:  ALDACTONE     TAKE these medications   acetaminophen 500 MG tablet Commonly known as:  TYLENOL Take 2 tablets (1,000 mg total) by mouth every 8 (eight) hours as needed. What changed:  reasons to take this   allopurinol 100 MG tablet Commonly known as:  ZYLOPRIM TAKE 1 TABLET BY MOUTH TWO TIMES DAILY What changed:    how much to take  how to take this  when to take this   amiodarone 200 MG tablet Commonly known as:  PACERONE Take 1 tablet (200 mg total) daily by mouth. TAKE 2 TABLETS DAILY UNTIL 06/14/2017, and then resume 1 daily What changed:  additional instructions   apixaban 5 MG Tabs tablet Commonly known as:  ELIQUIS Take 1 tablet (5 mg total) by mouth 2 (two) times daily.   carvedilol 6.25 MG tablet Commonly known as:  COREG Take 1 tablet (6.25 mg total) 2 (two) times daily with a meal by mouth. What changed:    medication strength  how much to take   colchicine 0.6 MG tablet Take 0.6 mg 2  (two) times daily as needed by mouth (FOR GOUT FLARES). Reported on 01/18/2016   diclofenac sodium 1 % Gel Commonly known as:  VOLTAREN Apply 4 g topically 4 (four) times daily. What changed:    when to take this  reasons to take this   isosorbide-hydrALAZINE 20-37.5 MG tablet Commonly known as:  BIDIL Take 1 tablet 3 (three) times daily by mouth. What changed:  how much to take   KLOR-CON M20 20 MEQ tablet Generic drug:  potassium chloride SA TAKE 1 TABLET (20 MEQ TOTAL) BY MOUTH 2 (TWO) TIMES DAILY.   metolazone 2.5 MG tablet Commonly known as:  ZAROXOLYN Take 1 Tablet Every Wednesday. What changed:  Another medication with the same name was removed. Continue taking this medication, and follow the directions you see here.   torsemide 20 MG tablet Commonly known as:  DEMADEX Take 4 tablets (80 mg total) 2 (two) times daily by mouth. What changed:    See the new instructions.  Another medication with the same name was removed. Continue taking this medication, and follow the directions you see here.       Disposition and follow-up:   Eric Murillo was discharged from [facility] in Stable condition.  At the hospital follow up visit please address:  1.  -Encourage adherence to HF therapy including: diuretic regimen (torsemide 80 mg BID + metolazone weekly) BB (  Coreg 6.25 mg BID), Bidil (1 tab TID), fluid and salt restriction -Assess volume status  -Assess heart rate and rhythm, adherence to amiodarone (BID dosing until 11/22, return to daily doses) -Encourage CPAP use and weight loss   2.  Labs / imaging needed at time of follow-up: None  3.  Pending labs/ test needing follow-up: None   Follow-up Appointments: Follow-up Information    Regan Lemmingamnitz, Will Martin, MD Follow up on 07/31/2017.   Specialty:  Cardiology Why:  9:15AM Contact information: [address] [place] [address] [phone]        [facility]. Go on 06/18/2017.   Specialty:  Cardiology Why:  8:30 AM, Advanced Heart Failure Clinic, parking code [id number] Contact information: [address] [id number] [place] [id number] [phone]          Hospital Course by problem list:   HFrEF Exacerbation (EF 20-25%, s/p ICD) Patient presented with progressive chest tightness and dyspnea on exertion with increased lower extremity and abdominal swelling, edema on chest x-ray occurring in the setting of nonadherence with fluid restriction and missed metolazone doses.  Repeat echo showed a decrease in estimated ejection fraction to 20-25% (previously 25-30).  He was started on IV diuresis with metolazone which led to good urine output and weight reduction below his previous reported dry weight of 320 pounds.  Discharge weight was 309 lbs after being net negative 14.3 L over the course of admission.  He was transitioned to oral diuretics with a regimen of torsemide 80 mg twice daily and metolazone once a week. He was discharged with [facility] and with the HF clinic.   Atrial Flutter s/p cardioversion On presentation, the patient was found to be in recurrent atypical atrial flutter which was cardioverted in the past (2017) which was likely contributing to his heart failure exacerbation in symptoms.  He is on a home regimen of amiodarone and Eliquis which were continued.  Cardiology performed a TEE cardioversion which was successful.  He had a period of type I second-degree AV block following the procedure which resolved, though the patient remained in first-degree AV block.  His carvedilol was initially held while in second-degree AV block and restarted, discharged on 6.25 twice daily.  His home amiodarone was also increased to 200 mg twice daily which will be continued until 11/22 (7 days total) and can be reduced to once daily dosing thereafter.  During the admission, EP evaluated  the patient and felt that his sleep apnea and weight needed to be better controlled prior to any potential ablation treatment.  Acute on Chronic Kidney Injury (CKD IV) Patient presented with elevated creatinine to 4.0 compared to a baseline of 2.7-2.9.  Renal ultrasound ruled out obstruction.  This is felt to be due to volume overload and improved with diuresis and was back to his baseline at discharge. He did have a slight Cr bump (remained within baseline) on last day of IV diuresis indicating he had reached a dry weight his kidneys could tolerate.   H/o Hypertension Patient has a history of hypertension but was normotensive or had soft blood pressure during the admission.  His home BiDil was initially held and then titrated up to 1 tab three times daily. His home spironolactone was held during the admission and on discharge due to normotensive pressures.   Obstructive Sleep Apnea Patient has history of obstructive sleep apnea diagnosed in 2013, however  patient does not use CPAP at home due to inability to tolerate mask.  Given evaluation by EP as described above, the patient was encouraged to attempt CPAP which will allow better control of his arrhythmia and other comorbidities.  Obesity, BMI 45-49.9 Patient is currently morbidly obese and weight loss would benefit several of his chronic illnesses and was recommended by EP prior to pursuing definitive a flutter treatment.  Discharge Vitals:   BP (!) 99/58 (BP Location: Right Arm)   Pulse 86   Temp 98.6 F (37 C) (Oral)   Resp 18   Ht 5' 9.5" (1.765 m)   Wt (!) 309 lb 3.2 oz (140.3 kg) Comment: scale c  SpO2 96%   BMI 45.01 kg/m   Pertinent Labs, Studies, and Procedures:  TEE Cardioversion: Successful  TTE: LV EF 20-25%, R atrial dilation, pulmonary hypertension (PA pressure 66),    Discharge Instructions: Discharge Instructions    Diet - low sodium heart healthy   Complete by:  As directed    Discharge instructions   Complete  by:  As directed    It was a pleasure to take care of you Eric Murillo.  You have an appointment in the [facility] on 11/27 at 10:15 am and you will also follow up with the Heart Failure doctors.  -For your medicines: Take 1 tablet (6.25 mg) of Coreg two times a day.  Take the Amiodarone twice a day until 11/22 and then take it once a day after that Take 80 mg of Torsemide (4 tablets) twice a day. So 4 tablets in the morning and 4 tablets at night Take the Metolazone once a week on Wednesdays  Take 1 tablet of your Bidil daily      Signed: Ginger Carne, MD 06/11/2017, 4:41 PM   Pager: [phone]

## 2017-06-09 DIAGNOSIS — I5020 Unspecified systolic (congestive) heart failure: Secondary | ICD-10-CM

## 2017-06-09 LAB — BASIC METABOLIC PANEL
Anion gap: 12 (ref 5–15)
BUN: 50 mg/dL — AB (ref 6–20)
CALCIUM: 9 mg/dL (ref 8.9–10.3)
CO2: 33 mmol/L — AB (ref 22–32)
CREATININE: 2.88 mg/dL — AB (ref 0.61–1.24)
Chloride: 96 mmol/L — ABNORMAL LOW (ref 101–111)
GFR calc non Af Amer: 23 mL/min — ABNORMAL LOW (ref >60)
GFR, EST AFRICAN AMERICAN: 27 mL/min — AB (ref >60)
GLUCOSE: 124 mg/dL — AB (ref 65–99)
Potassium: 3.2 mmol/L — ABNORMAL LOW (ref 3.5–5.1)
Sodium: 141 mmol/L (ref 135–145)

## 2017-06-09 MED ORDER — POLYETHYLENE GLYCOL 3350 17 G PO PACK
17.0000 g | PACK | Freq: Once | ORAL | Status: AC
  Administered 2017-06-09: 17 g via ORAL
  Filled 2017-06-09: qty 1

## 2017-06-09 MED ORDER — POTASSIUM CHLORIDE CRYS ER 20 MEQ PO TBCR
40.0000 meq | EXTENDED_RELEASE_TABLET | Freq: Two times a day (BID) | ORAL | Status: AC
  Administered 2017-06-09: 40 meq via ORAL
  Filled 2017-06-09: qty 2

## 2017-06-09 MED ORDER — POTASSIUM CHLORIDE CRYS ER 20 MEQ PO TBCR
40.0000 meq | EXTENDED_RELEASE_TABLET | Freq: Once | ORAL | Status: AC
  Administered 2017-06-09: 40 meq via ORAL
  Filled 2017-06-09: qty 2

## 2017-06-09 MED ORDER — METOLAZONE 5 MG PO TABS
5.0000 mg | ORAL_TABLET | Freq: Once | ORAL | Status: AC
  Administered 2017-06-09: 5 mg via ORAL
  Filled 2017-06-09: qty 1

## 2017-06-09 NOTE — Plan of Care (Signed)
Patient continues to diurese, no complaints of chest pain.  Shortness of breath has improved per patient.

## 2017-06-09 NOTE — Progress Notes (Signed)
Patient declined CPAP at this time. No distress noted.

## 2017-06-09 NOTE — Progress Notes (Signed)
Patient ID: Blanchard KelchJasper Dase, male   DOB: Aug 06, 1960, 56 y.o.   MRN: [id number]     Advanced Heart Failure Rounding Note   Primary Cardiologist: Shirlee LatchMcLean  Subjective:    Patient was admitted with atypical flutter and underwent TEE-guided DCCV on 11/14.  He remains in NSR today with long 1st degree AVB.  Of note, he had type 1 2nd degree AV block immediately after DCCV.  He also appears to have 2 P wave morphologies when not in atrial flutter, likely has an ectopic atrial focus as well. We initially held his Coreg with the type 1 2nd degree AV block.   Run of NSVT noted 11/15.   EP consulted and recommend OSA treatment and weight loss.  Has CPAP at home.   Continues to diurese with IV lasix + metolazone.  Weight down another 2 pounds. Says he thinks he is still overloaded. C/o cramps. Renal function improving     Echo: EF 20-25%, diffuse hypokinesis, moderately dilated LV, mildly decreased RV systolic function, PASP 66 mmHg.   TEE: EF 25-30% with mildly dilated LV and diffuse hypokinesis, moderately decreased RV systolic function, mild-moderate TR.   Objective:   Weight Range: (!) 142.5 kg (314 lb 3.2 oz) Body mass index is 45.73 kg/m.   Vital Signs:   Temp:  [97.6 F (36.4 C)-98.4 F (36.9 C)] 97.6 F (36.4 C) (11/17 0511) Pulse Rate:  [68-72] 70 (11/17 1001) Resp:  [18] 18 (11/17 0511) BP: (107-136)/(58-118) 107/65 (11/17 1001) SpO2:  [94 %-97 %] 96 % (11/17 1001) Weight:  [142.5 kg (314 lb 3.2 oz)] 142.5 kg (314 lb 3.2 oz) (11/17 0511) Last BM Date: 06/07/17  Weight change: Filed Weights   06/07/17 2013 06/08/17 0525 06/09/17 0511  Weight: (!) 144.6 kg (318 lb 12.8 oz) (!) 143.7 kg (316 lb 12.8 oz) (!) 142.5 kg (314 lb 3.2 oz)    Intake/Output:   Intake/Output Summary (Last 24 hours) at 06/09/2017 1146 Last data filed at 06/09/2017 1125 Gross per 24 hour  Intake 1142 ml  Output 4000 ml  Net -2858 ml      Physical Exam    General:  Sitting in chair  No resp  difficulty HEENT: normal Neck: supple. JVP hard to see looks about 8  Carotids 2+ bilat; no bruits. No lymphadenopathy or thryomegaly appreciated. Cor: PMI nonpalpable . Regular rate & rhythm. 2/6 TR Lungs: clear Abdomen: obese soft, nontender, nondistended. No hepatosplenomegaly. No bruits or masses. Good bowel sounds. Extremities: no cyanosis, clubbing, rash,  UNNA boots probable trace - 1+ edema underneath Neuro: alert & orientedx3, cranial nerves grossly intact. moves all 4 extremities w/o difficulty. Affect pleasant   Telemetry   NSR 70-80, 1st degree AVB.  Personally reviewed    Labs    CBC Recent Labs    06/07/17 0613  WBC 12.1*  NEUTROABS 8.4*  HGB 11.6*  HCT 38.8*  MCV 93.5  PLT 223   Basic Metabolic Panel Recent Labs    40/98/1111/15/18 0613 06/08/17 0413 06/09/17 0613  NA 143 142 141  K 3.6 3.1* 3.2*  CL 98* 96* 96*  CO2 33* 35* 33*  GLUCOSE 91 102* 124*  BUN 64* 56* 50*  CREATININE 3.06* 2.96* 2.88*  CALCIUM 9.0 8.8* 9.0  MG 2.7*  --   --    Liver Function Tests No results for input(s): AST, ALT, ALKPHOS, BILITOT, PROT, ALBUMIN in the last 72 hours. No results for input(s): LIPASE, AMYLASE in the last 72 hours. Cardiac Enzymes No  results for input(s): CKTOTAL, CKMB, CKMBINDEX, TROPONINI in the last 72 hours.  BNP: BNP (last 3 results) Recent Labs    09/11/16 1221 10/05/16 1545 06/05/17 0301  BNP 343.2* 322.3* 672.6*    ProBNP (last 3 results) No results for input(s): PROBNP in the last 8760 hours.   D-Dimer No results for input(s): DDIMER in the last 72 hours. Hemoglobin A1C No results for input(s): HGBA1C in the last 72 hours. Fasting Lipid Panel No results for input(s): CHOL, HDL, LDLCALC, TRIG, CHOLHDL, LDLDIRECT in the last 72 hours. Thyroid Function Tests No results for input(s): TSH, T4TOTAL, T3FREE, THYROIDAB in the last 72 hours.  Invalid input(s): FREET3  Other results:   Imaging    No results found.   Medications:      Scheduled Medications: . allopurinol  100 mg Oral BID  . amiodarone  200 mg Oral BID  . apixaban  5 mg Oral BID  . carvedilol  6.25 mg Oral BID WC  . isosorbide-hydrALAZINE  1 tablet Oral TID  . sodium chloride flush  3 mL Intravenous Q12H    Infusions: . sodium chloride Stopped (06/06/17 1257)  . sodium chloride 250 mL (06/05/17 1510)  . furosemide Stopped (06/09/17 0930)    PRN Medications: acetaminophen **OR** acetaminophen, ondansetron (ZOFRAN) IV, senna-docusate, sodium chloride flush    Patient Profile   56 yo with history of nonischemic cardiomyopathy/chronic systolic CHF, CKD stage 3 baseline creatinine 2.7, and paroxymal atrial flutter presents with acute on chronic systolic CHF in setting of recurrent atrial flutter.   Assessment/Plan   1. Acute on chronic systolic CHF: Nonischemic cardiomyopathy.  Echo this admission with stable EF 20-25%.  Associated AKI on CKD.  It is possible that CHF exacerbation was triggered by atrial flutter as it has been in the past, now in NSR after DCCV. BP stable.  - Volume status continues to improve. Will continue IV lasix 120 mg bid today and give another dose 2.5 mg metolazone today. Hopefully can get to po tomorrow .  - Coreg held with type 1 2nd degree AVB and long 1st degree AV block.   - Tolerating carvedilol 6.25 mg bid today.  - Continue Bidil 1 tab tid  - No ACE/ARB/aRNI with AKI/CKD 2. AKI on CKD stage 3:  - Creatinine up to 4 at admission, 2.88 today (baseline 2.8-2.9).   3. Atrial flutter: Atypical, recurrent.  Occurred again despite amiodarone use.  He seems to tolerate atrial flutter poorly, has had CHF flares when he has had flutter in the past.  TEE-DCCV on 11/14, in NSR this morning.  Interestingly, when he is out of flutter he appears to have 2 different P waves morphologies so appears to have an ectopic atrial focus as well.  He has a long 1st degree AVB and had type 1 2nd degree AVB after DCCV 11/14.  Maintaining  NSR.  - Continue Eliquis.  - Continue amiodarone 200 mg bid for a few days then back to 200 mg daily.  - Coreg restarted, no further type 1 2nd degree AVB.  Continue 6.25 mg bid.   - EP has seen for ?flutter ablation for more permanent treatment => think he would not be very likely to hold long-term, recommend CPAP and weight loss to start with, will followup with him in EP clinic.    4. OSA: Severe.  Potentiates atrial arrhythmias.  - CPAP re-initiated  5. Hypokalemia - will supp   Length of Stay: 4  Arvilla Meres, MD  06/09/2017,  11:46 AM  Advanced Heart Failure Team Pager [phone] (M-F; 7a - 4p)  Please contact [facility] for night-coverage after hours (4p -7a ) and weekends on [email or web address]

## 2017-06-09 NOTE — Progress Notes (Signed)
Subjective:   No acute events overnight. Pt has continued to have good urine output with 3.7 L over last 24 hours, net negative 8.8 L since admission and weight is down to 314 lbs  Plan was  To transition him to oral diuretics today, but upon discussion with heart failure team, they would like to make the decision after rounding- appreciate and await their recommendations   Objective:  Vital signs in last 24 hours: Vitals:   06/08/17 1938 06/08/17 2215 06/09/17 0511 06/09/17 1001  BP: (!) 112/58 109/67 130/78 107/65  Pulse: 71  72 70  Resp: 18  18   Temp: 98.4 F (36.9 C)  97.6 F (36.4 C)   TempSrc: Oral  Oral   SpO2: 94%  95% 96%  Weight:   (!) 314 lb 3.2 oz (142.5 kg)   Height:       General: Obese male, sitting in chair comfortably, no acute distress CV: AICD L chest wall, no murmur, normal rate  Resp: Clear breath sounds, normal work of breathing, no distress  Abd: Soft, +BS, obese and soft after improvement in edema  Extr: Continued interval improvement to pitting edema to bilateral LEs  Neuro: Alert and oriented x3 Skin: Warm, dry, acrochordons to neck      Assessment/Plan:  HFrEF exacerbation (EF 25-30%, s/p ICD)  Patient presented with progressive chest tightness and dyspnea on exertion with increased swelling and edema on chest x-ray in the setting of nonadherence with fluid restriction, missed metolazone dose.  Estimated dry weight per patient around 320 pounds, weight has improved.  He has received IV diuresis with good response. Cardiology consulted, appreciate recommendations. He is now s/p cardioversion as below which should improve his HF and volume status.  He is net negative 8.8 L since admission  --Cont IV Lasix 120 mg BID with metolazone 2.5 mg today, will await heart failure recs about transitioning to oral regimen --Strict I/Os, daily weights, fluid and salt restriction --Cont home Bidil TID --Unna boots to LE    Hypokalemia -repleted  H/o Atrial  Flutter s/p cardioversion (2017) Patient has a history of atrial flutter and cardioversion performed in 2017 and was found to be in atrial flutter on admission, likely contributing to his HF exacerbation. Cardiology performed TEE with successful cardioversion, noted to be in Type 1 2nd degree AV block following and coreg was held. This has resolved, though he has persistent 1st degree AV block. Appreciate cardiology/EP assistance in management. He is on amiodarone at home, will continue BID dosing for seven days and resume daily dosing. EP evaluated, the pt needs improved OSA tx and weight loss prior to consideration for ablation therapies.     --Tele --Cont Amiodarone to 200 mg BID  --Cont Eliquis  --Coreg 6.25 mg BID   Acute on Chronic Kidney Injury (CKD IV)  Patient has a history of chronic kidney disease stage IV with baseline creatinine around 2.7-2.9.  Creatinine on admission elevated to 4.0.  Renal ultrasound on admission without obstruction.  Likely due to volume overload and has improved with diuresis, currently 2.96, c/w his baseline.  --BMP, replete electrolytes prn --Cont diuresis as above  OSA Pt has a history of OSA that is not currently treated as he has not tolerated CPAP mask in the past. At home he reports sleeping in a kneeling position. His uncontrolled OSA is likely contributing to his comorbidities and may preclude ablation as described above.  --CPAP per RT   Dispo: Anticipated discharge in approximately  1-2 day(s).   Deneise Lever, MD 06/09/2017, 11:36 AM

## 2017-06-09 NOTE — Plan of Care (Signed)
  Progressing Health Behavior/Discharge Planning: Ability to manage health-related needs will improve 06/09/2017 0356 - Progressing by Myna Bright, RN Clinical Measurements: Ability to maintain clinical measurements within normal limits will improve 06/09/2017 0356 - Progressing by Myna Bright, RN Diagnostic test results will improve 06/09/2017 0356 - Progressing by Myna Bright, RN Respiratory complications will improve 06/09/2017 0356 - Progressing by Myna Bright, RN Cardiovascular complication will be avoided 06/09/2017 0356 - Progressing by Myna Bright, RN

## 2017-06-09 NOTE — Discharge Instructions (Signed)

## 2017-06-10 DIAGNOSIS — G4733 Obstructive sleep apnea (adult) (pediatric): Secondary | ICD-10-CM

## 2017-06-10 LAB — BASIC METABOLIC PANEL
ANION GAP: 10 (ref 5–15)
BUN: 46 mg/dL — AB (ref 6–20)
CALCIUM: 9 mg/dL (ref 8.9–10.3)
CHLORIDE: 96 mmol/L — AB (ref 101–111)
CO2: 34 mmol/L — ABNORMAL HIGH (ref 22–32)
Creatinine, Ser: 2.75 mg/dL — ABNORMAL HIGH (ref 0.61–1.24)
GFR calc Af Amer: 28 mL/min — ABNORMAL LOW (ref >60)
GFR, EST NON AFRICAN AMERICAN: 24 mL/min — AB (ref >60)
GLUCOSE: 101 mg/dL — AB (ref 65–99)
POTASSIUM: 3.6 mmol/L (ref 3.5–5.1)
SODIUM: 140 mmol/L (ref 135–145)

## 2017-06-10 LAB — MAGNESIUM: MAGNESIUM: 2.7 mg/dL — AB (ref 1.7–2.4)

## 2017-06-10 MED ORDER — POTASSIUM CHLORIDE CRYS ER 20 MEQ PO TBCR
20.0000 meq | EXTENDED_RELEASE_TABLET | Freq: Two times a day (BID) | ORAL | Status: DC
  Administered 2017-06-10 – 2017-06-11 (×2): 20 meq via ORAL
  Filled 2017-06-10 (×2): qty 1

## 2017-06-10 MED ORDER — METOLAZONE 5 MG PO TABS
5.0000 mg | ORAL_TABLET | Freq: Two times a day (BID) | ORAL | Status: DC
  Administered 2017-06-10 – 2017-06-11 (×2): 5 mg via ORAL
  Filled 2017-06-10 (×2): qty 1

## 2017-06-10 MED ORDER — POTASSIUM CHLORIDE ER 10 MEQ PO TBCR: 20.0000 meq | EXTENDED_RELEASE_TABLET | Freq: Every day | ORAL | Status: DC

## 2017-06-10 MED ORDER — POTASSIUM CHLORIDE ER 10 MEQ PO TBCR
40.0000 meq | EXTENDED_RELEASE_TABLET | Freq: Once | ORAL | Status: AC
  Administered 2017-06-10: 40 meq via ORAL
  Filled 2017-06-10 (×2): qty 4

## 2017-06-10 NOTE — Plan of Care (Signed)
  Progressing Health Behavior/Discharge Planning: Ability to manage health-related needs will improve 06/10/2017 0426 - Progressing by Myna Bright, RN Clinical Measurements: Ability to maintain clinical measurements within normal limits will improve 06/10/2017 0426 - Progressing by Myna Bright, RN Diagnostic test results will improve 06/10/2017 0426 - Progressing by Myna Bright, RN Respiratory complications will improve 06/10/2017 0426 - Progressing by Myna Bright, RN Cardiovascular complication will be avoided 06/10/2017 0426 - Progressing by Myna Bright, RN

## 2017-06-10 NOTE — Progress Notes (Signed)
Patient ID: Eric Murillo, male   DOB: 1961/04/10, 56 y.o.   MRN: [id number]     Advanced Heart Failure Rounding Note   Primary Cardiologist: Shirlee Latch  Subjective:    Patient was admitted with atypical flutter and underwent TEE-guided DCCV on 11/14.  He remains in NSR today with long 1st degree AVB.  Of note, he had type 1 2nd degree AV block immediately after DCCV.  He also appears to have 2 P wave morphologies when not in atrial flutter, likely has an ectopic atrial focus as well. We initially held his Coreg with the type 1 2nd degree AV block.   Run of NSVT noted 11/15.   EP consulted and recommend OSA treatment and weight loss.  Has CPAP at home.   Continues to diurese with IV lasix + metolazone but weight up a pound. Urine still very clear. Breathing better. Creatinine improving.   Echo: EF 20-25%, diffuse hypokinesis, moderately dilated LV, mildly decreased RV systolic function, PASP 66 mmHg.   TEE: EF 25-30% with mildly dilated LV and diffuse hypokinesis, moderately decreased RV systolic function, mild-moderate TR.   Objective:   Weight Range: (!) 315 lb (142.9 kg) Body mass index is 45.85 kg/m.   Vital Signs:   Temp:  [98.2 F (36.8 C)-98.5 F (36.9 C)] 98.5 F (36.9 C) (11/18 1207) Pulse Rate:  [69-106] 71 (11/18 1207) Resp:  [18] 18 (11/18 1207) BP: (91-122)/(39-85) 91/39 (11/18 1207) SpO2:  [95 %-99 %] 96 % (11/18 1207) Weight:  [315 lb (142.9 kg)] 315 lb (142.9 kg) (11/18 0637) Last BM Date: 06/09/17  Weight change: Filed Weights   06/08/17 0525 06/09/17 0511 06/10/17 0637  Weight: (!) 316 lb 12.8 oz (143.7 kg) (!) 314 lb 3.2 oz (142.5 kg) (!) 315 lb (142.9 kg)    Intake/Output:   Intake/Output Summary (Last 24 hours) at 06/10/2017 1259 Last data filed at 06/10/2017 1241 Gross per 24 hour  Intake 1080 ml  Output 3800 ml  Net -2720 ml      Physical Exam    General: Sitting in chair  Well appearing. No resp difficulty HEENT: normal Neck: supple. JVP  hard to see but looks 8-9 Carotids 2+ bilat; no bruits. No lymphadenopathy or thryomegaly appreciated. Cor: PMI nonpalpable  Regular rate & rhythm. No rubs, gallops or murmurs. Lungs: clear Abdomen: morbidly obese soft, nontender, nondistended. No obvious hepatosplenomegaly. No bruits or masses. Good bowel sounds. Extremities: no cyanosis, clubbing, rash, 2+ edema under UNNA boots  Neuro: alert & orientedx3, cranial nerves grossly intact. moves all 4 extremities w/o difficulty. Affect pleasant   Telemetry   NSR 70s, 1st degree AVB. Personally reviewed     Labs    CBC No results for input(s): WBC, NEUTROABS, HGB, HCT, MCV, PLT in the last 72 hours. Basic Metabolic Panel Recent Labs    66/59/93 0613 06/10/17 0507  NA 141 140  K 3.2* 3.6  CL 96* 96*  CO2 33* 34*  GLUCOSE 124* 101*  BUN 50* 46*  CREATININE 2.88* 2.75*  CALCIUM 9.0 9.0  MG  --  2.7*   Liver Function Tests No results for input(s): AST, ALT, ALKPHOS, BILITOT, PROT, ALBUMIN in the last 72 hours. No results for input(s): LIPASE, AMYLASE in the last 72 hours. Cardiac Enzymes No results for input(s): CKTOTAL, CKMB, CKMBINDEX, TROPONINI in the last 72 hours.  BNP: BNP (last 3 results) Recent Labs    09/11/16 1221 10/05/16 1545 06/05/17 0301  BNP 343.2* 322.3* 672.6*    ProBNP (  last 3 results) No results for input(s): PROBNP in the last 8760 hours.   D-Dimer No results for input(s): DDIMER in the last 72 hours. Hemoglobin A1C No results for input(s): HGBA1C in the last 72 hours. Fasting Lipid Panel No results for input(s): CHOL, HDL, LDLCALC, TRIG, CHOLHDL, LDLDIRECT in the last 72 hours. Thyroid Function Tests No results for input(s): TSH, T4TOTAL, T3FREE, THYROIDAB in the last 72 hours.  Invalid input(s): FREET3  Other results:   Imaging    No results found.   Medications:     Scheduled Medications: . allopurinol  100 mg Oral BID  . amiodarone  200 mg Oral BID  . apixaban  5 mg  Oral BID  . carvedilol  6.25 mg Oral BID WC  . isosorbide-hydrALAZINE  1 tablet Oral TID  . sodium chloride flush  3 mL Intravenous Q12H    Infusions: . sodium chloride Stopped (06/06/17 1257)  . sodium chloride 250 mL (06/05/17 1510)  . furosemide Stopped (06/10/17 0910)    PRN Medications: acetaminophen **OR** acetaminophen, ondansetron (ZOFRAN) IV, senna-docusate, sodium chloride flush    Patient Profile   56 yo with history of nonischemic cardiomyopathy/chronic systolic CHF, CKD stage 3 baseline creatinine 2.7, and paroxymal atrial flutter presents with acute on chronic systolic CHF in setting of recurrent atrial flutter.   Assessment/Plan   1. Acute on chronic systolic CHF: Nonischemic cardiomyopathy.  Echo this admission with stable EF 20-25%.  Associated AKI on CKD.  It is possible that CHF exacerbation was triggered by atrial flutter as it has been in the past, now in NSR after DCCV. BP stable.  - Volume status continues to improve but still edematous.  Will continue IV lasix 120 mg bid today and increase metolazone to 5 bid.  Hopefully can get to po tomorrow .  - Coreg held with type 1 2nd degree AVB and long 1st degree AV block.   - Tolerating carvedilol 6.25 mg bid today.  - Continue Bidil 1 tab tid  - No ACE/ARB/aRNI with AKI/CKD 2. AKI on CKD stage 3:  - Creatinine up to 4 at admission, 2.75 today (baseline 2.8-2.9).   3. Atrial flutter: Atypical, recurrent.  Occurred again despite amiodarone use.  He seems to tolerate atrial flutter poorly, has had CHF flares when he has had flutter in the past.  TEE-DCCV on 11/14, in NSR this morning.  Interestingly, when he is out of flutter he appears to have 2 different P waves morphologies so appears to have an ectopic atrial focus as well.  He has a long 1st degree AVB and had type 1 2nd degree AVB after DCCV 11/14.  Maintaining NSR.  - Continue Eliquis.  - Continue amiodarone 200 mg bid for a few days then back to 200 mg daily.    - Coreg restarted, no further type 1 2nd degree AVB.  Continue 6.25 mg bid.   - EP has seen for ?flutter ablation for more permanent treatment => think he would not be very likely to hold long-term, recommend CPAP and weight loss to start with, will followup with him in EP clinic.    4. OSA: Severe.  Potentiates atrial arrhythmias.  - CPAP re-initiated struggles with claustrophobia.  5. Hypokalemia - K 3.6  will supp   Length of Stay: 5  Arvilla Meresaniel Bensimhon, MD  06/10/2017, 12:59 PM  Advanced Heart Failure Team Pager [phone] (M-F; 7a - 4p)  Please contact [facility] for night-coverage after hours (4p -7a ) and weekends on  [email or web address]
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
## 2017-06-10 NOTE — Plan of Care (Signed)
Patient continues to diurese.  Patient began removing his own unna boots, nurse cut them the rest of the way off.  Legs no longer weeping, 1+ edema, small area to left lower leg open foam applied.

## 2017-06-11 ENCOUNTER — Telehealth: Payer: Self-pay

## 2017-06-11 DIAGNOSIS — Z8679 Personal history of other diseases of the circulatory system: Secondary | ICD-10-CM

## 2017-06-11 DIAGNOSIS — Z6841 Body Mass Index (BMI) 40.0 and over, adult: Secondary | ICD-10-CM

## 2017-06-11 LAB — BASIC METABOLIC PANEL
ANION GAP: 13 (ref 5–15)
BUN: 50 mg/dL — ABNORMAL HIGH (ref 6–20)
CALCIUM: 9.3 mg/dL (ref 8.9–10.3)
CO2: 32 mmol/L (ref 22–32)
Chloride: 95 mmol/L — ABNORMAL LOW (ref 101–111)
Creatinine, Ser: 2.92 mg/dL — ABNORMAL HIGH (ref 0.61–1.24)
GFR, EST AFRICAN AMERICAN: 26 mL/min — AB (ref >60)
GFR, EST NON AFRICAN AMERICAN: 23 mL/min — AB (ref >60)
GLUCOSE: 96 mg/dL (ref 65–99)
Potassium: 3.5 mmol/L (ref 3.5–5.1)
Sodium: 140 mmol/L (ref 135–145)

## 2017-06-11 MED ORDER — ISOSORB DINITRATE-HYDRALAZINE 20-37.5 MG PO TABS: 1.0000 | ORAL_TABLET | Freq: Three times a day (TID) | ORAL | 3 refills | Status: DC

## 2017-06-11 MED ORDER — CARVEDILOL 6.25 MG PO TABS: 6.2500 mg | ORAL_TABLET | Freq: Two times a day (BID) | ORAL | 1 refills | Status: DC

## 2017-06-11 MED ORDER — TORSEMIDE 20 MG PO TABS: 80.0000 mg | ORAL_TABLET | Freq: Two times a day (BID) | ORAL | 1 refills | Status: DC

## 2017-06-11 MED ORDER — AMIODARONE HCL 200 MG PO TABS: 200.0000 mg | ORAL_TABLET | Freq: Every day | ORAL | 1 refills | Status: DC

## 2017-06-11 NOTE — Telephone Encounter (Signed)
Hospital TOC per Dr Anthonette Legato, appt 06/19/2017 @ 10:15, discharge 06/11/2017.

## 2017-06-11 NOTE — Progress Notes (Signed)
Reviewed discharge instructions/medications with patient and family member.  Answered their questions. Patient is stable and ready for discharge.

## 2017-06-11 NOTE — Progress Notes (Signed)
Physical Therapy Treatment Patient Details Name: Eric Murillo MRN: [id number] DOB: 1961-05-20 Today's Date: 06/11/2017    History of Present Illness Pt is a 56 y/o male admitted secondary to chest pain and SOB. Per MD notes, suspect CHF exacerbation. PMH includes OSA, HTN, CHF, CKD, a flutter, gout, non ischemic cardiomyopathy, and AICD     PT Comments    Pt was assisted to walk with supervised gait on the hallway, then reviewed ROM to work on LE stiffness and edema.  Pt is motivated to lose weight and talked with him about cardiac rehab to think about help with reconditioning.  Will follow acutely as long as pt here, and will transition to cardiac rehab if possible and pt willing (may decide not to go).     Follow Up Recommendations  Outpatient PT(recommend cardiac rehab as pt is wants to recondition )     Equipment Recommendations  None recommended by PT    Recommendations for Other Services       Precautions / Restrictions Precautions Precautions: None Restrictions Weight Bearing Restrictions: No    Mobility  Bed Mobility Overal bed mobility: Modified Independent             General bed mobility comments: pt in chair when PT arrived  Transfers Overall transfer level: Modified independent Equipment used: None             General transfer comment: using a chair with no arms today  Ambulation/Gait Ambulation/Gait assistance: Supervision Ambulation Distance (Feet): 300 Feet Assistive device: None Gait Pattern/deviations: Step-through pattern;Wide base of support;Trunk flexed;Decreased stride length Gait velocity: Decreased Gait velocity interpretation: Below normal speed for age/gender General Gait Details: No LOB during gait despite being jostled in the hall   Stairs            Wheelchair Mobility    Modified Rankin (Stroke Patients Only)       Balance     Sitting balance-Leahy Scale: Good     Standing balance support: No upper  extremity supported;During functional activity Standing balance-Leahy Scale: Good Standing balance comment: no initial unsteadiness but takes a moment to control balance initially                            Cognition Arousal/Alertness: Awake/alert Behavior During Therapy: WFL for tasks assessed/performed Overall Cognitive Status: Within Functional Limits for tasks assessed                                        Exercises General Exercises - Lower Extremity Ankle Circles/Pumps: AROM;Both;5 reps Long Arc Quad: AROM;Both;10 reps Heel Slides: AROM;Both;10 reps Hip ABduction/ADduction: AROM;Both;10 reps Hip Flexion/Marching: AROM;Both;10 reps    General Comments General comments (skin integrity, edema, etc.): pt has bandages on lower legs but cannot see what they cover      Pertinent Vitals/Pain Pain Assessment: No/denies pain    Home Living                      Prior Function            PT Goals (current goals can now be found in the care plan section) Acute Rehab PT Goals Patient Stated Goal: to walk and move more, manage weight Progress towards PT goals: Progressing toward goals    Frequency    Min 3X/week  PT Plan Discharge plan needs to be updated    Co-evaluation              AM-PAC PT "6 Clicks" Daily Activity  Outcome Measure  Difficulty turning over in bed (including adjusting bedclothes, sheets and blankets)?: None Difficulty moving from lying on back to sitting on the side of the bed? : A Little Difficulty sitting down on and standing up from a chair with arms (e.g., wheelchair, bedside commode, etc,.)?: A Little Help needed moving to and from a bed to chair (including a wheelchair)?: None Help needed walking in hospital room?: None Help needed climbing 3-5 steps with a railing? : A Little 6 Click Score: 21    End of Session   Activity Tolerance: Patient tolerated treatment well Patient left: in  chair;with call bell/phone within reach Nurse Communication: Mobility status PT Visit Diagnosis: Other abnormalities of gait and mobility (R26.89);Unsteadiness on feet (R26.81)     Time: 9323-5573 PT Time Calculation (min) (ACUTE ONLY): 26 min  Charges:  $Gait Training: 8-22 mins $Therapeutic Exercise: 8-22 mins                    G Codes:  Functional Assessment Tool Used: AM-PAC 6 Clicks Basic Mobility     Ivar Drape 06/11/2017, 11:36 AM   Samul Dada, PT MS Acute Rehab Dept. Number: [facility] [id number] and [facility] [phone]

## 2017-06-11 NOTE — Progress Notes (Addendum)
Patient ID: Eric Murillo, male   DOB: 1960/09/16, 56 y.o.   MRN: [id number]     Advanced Heart Failure Rounding Note   Primary Cardiologist: Shirlee Latch  Subjective:    Patient was admitted with atypical flutter and underwent TEE-guided DCCV on 11/14.  He remains in NSR today with long 1st degree AVB.  Of note, he had type 1 2nd degree AV block immediately after DCCV.  He also appears to have 2 P wave morphologies when not in atrial flutter, likely has an ectopic atrial focus as well. We initially held his Coreg with the type 1 2nd degree AV block.   Run of NSVT noted 11/15.   EP consulted and recommend OSA treatment and weight loss.  Has CPAP at home.   He diuresed again with IV Lasix and metolazone.  Weight down again.   Creatinine back up to 2.9.   No dyspnea walking in hall.   Echo: EF 20-25%, diffuse hypokinesis, moderately dilated LV, mildly decreased RV systolic function, PASP 66 mmHg.   TEE: EF 25-30% with mildly dilated LV and diffuse hypokinesis, moderately decreased RV systolic function, mild-moderate TR.   Objective:   Weight Range: (!) 309 lb 3.2 oz (140.3 kg) Body mass index is 45.01 kg/m.   Vital Signs:   Temp:  [98 F (36.7 C)-98.5 F (36.9 C)] 98 F (36.7 C) (11/19 0458) Pulse Rate:  [68-78] 78 (11/19 0458) Resp:  [18] 18 (11/19 0458) BP: (91-141)/(39-91) 141/87 (11/19 0458) SpO2:  [96 %-99 %] 98 % (11/19 0458) Weight:  [309 lb 3.2 oz (140.3 kg)] 309 lb 3.2 oz (140.3 kg) (11/19 0458) Last BM Date: 06/10/17  Weight change: Filed Weights   06/09/17 0511 06/10/17 0637 06/11/17 0458  Weight: (!) 314 lb 3.2 oz (142.5 kg) (!) 315 lb (142.9 kg) (!) 309 lb 3.2 oz (140.3 kg)    Intake/Output:   Intake/Output Summary (Last 24 hours) at 06/11/2017 0754 Last data filed at 06/11/2017 0600 Gross per 24 hour  Intake 1080 ml  Output 3800 ml  Net -2720 ml      Physical Exam    General: NAD, obese Neck: Thick, JVP 8 cm, no thyromegaly or thyroid nodule.    Lungs: Clear to auscultation bilaterally with normal respiratory effort. CV: Nonpalpable PMI.  Heart regular S1/S2, no S3/S4, no murmur.  1+ edema at ankles.   Abdomen: Soft, nontender, no hepatosplenomegaly, no distention.  Skin: Intact without lesions or rashes.  Neurologic: Alert and oriented x 3.  Psych: Normal affect. Extremities: No clubbing or cyanosis.  HEENT: Normal.     Telemetry   NSR 70s, 1st degree AVB.  Personally reviewed.     Labs    CBC No results for input(s): WBC, NEUTROABS, HGB, HCT, MCV, PLT in the last 72 hours. Basic Metabolic Panel Recent Labs    36/64/40 0507 06/11/17 0552  NA 140 140  K 3.6 3.5  CL 96* 95*  CO2 34* 32  GLUCOSE 101* 96  BUN 46* 50*  CREATININE 2.75* 2.92*  CALCIUM 9.0 9.3  MG 2.7*  --    Liver Function Tests No results for input(s): AST, ALT, ALKPHOS, BILITOT, PROT, ALBUMIN in the last 72 hours. No results for input(s): LIPASE, AMYLASE in the last 72 hours. Cardiac Enzymes No results for input(s): CKTOTAL, CKMB, CKMBINDEX, TROPONINI in the last 72 hours.  BNP: BNP (last 3 results) Recent Labs    09/11/16 1221 10/05/16 1545 06/05/17 0301  BNP 343.2* 322.3* 672.6*    ProBNP (  last 3 results) No results for input(s): PROBNP in the last 8760 hours.   D-Dimer No results for input(s): DDIMER in the last 72 hours. Hemoglobin A1C No results for input(s): HGBA1C in the last 72 hours. Fasting Lipid Panel No results for input(s): CHOL, HDL, LDLCALC, TRIG, CHOLHDL, LDLDIRECT in the last 72 hours. Thyroid Function Tests No results for input(s): TSH, T4TOTAL, T3FREE, THYROIDAB in the last 72 hours.  Invalid input(s): FREET3  Other results:   Imaging    No results found.   Medications:     Scheduled Medications: . allopurinol  100 mg Oral BID  . amiodarone  200 mg Oral BID  . apixaban  5 mg Oral BID  . carvedilol  6.25 mg Oral BID WC  . isosorbide-hydrALAZINE  1 tablet Oral TID  . potassium chloride  20  mEq Oral BID  . sodium chloride flush  3 mL Intravenous Q12H    Infusions: . sodium chloride Stopped (06/06/17 1257)  . sodium chloride 250 mL (06/05/17 1510)  . furosemide Stopped (06/10/17 1839)    PRN Medications: acetaminophen **OR** acetaminophen, ondansetron (ZOFRAN) IV, senna-docusate, sodium chloride flush    Patient Profile   56 yo with history of nonischemic cardiomyopathy/chronic systolic CHF, CKD stage 3 baseline creatinine 2.7, and paroxymal atrial flutter presents with acute on chronic systolic CHF in setting of recurrent atrial flutter.   Assessment/Plan   1. Acute on chronic systolic CHF: Nonischemic cardiomyopathy.  Echo this admission with stable EF 20-25%.  Associated AKI on CKD.  It is possible that CHF exacerbation was triggered by atrial flutter as it has been in the past, now in NSR after DCCV. BP stable. He has diuresed and volume status is considerably improved, weight down about 20 lbs.  Creatinine with slight trend backup. - Stop metolazone/IV Lasix today.  Will start him on torsemide 80 mg bid + metolazone once a week for home.  Would not give torsemide dose until this evening.  - Continue Coreg 6.25 mg bid and Bidil 1 tab tid.   - Cardiomems may be helpful, we will work on getting him this.  2. AKI on CKD stage 3: Creatinine up to 4 at admission, 2.92 today (was as low as 2.75, now trending back up).   3. Atrial flutter: Atypical, recurrent.  Occurred again despite amiodarone use.  He seems to tolerate atrial flutter poorly, has had CHF flares when he has had flutter in the past.  TEE-DCCV on 11/14, in NSR this morning.  Interestingly, when he is out of flutter he appears to have 2 different P waves morphologies so appears to have an ectopic atrial focus as well.  He has a long 1st degree AVB and had type 1 2nd degree AVB after DCCV 11/14.  Maintaining NSR.  - Continue Eliquis.  - Continue amiodarone 200 mg bid for 3 more days then back to 200 mg daily.  -  Coreg restarted, no further type 1 2nd degree AVB.   - EP has seen for ?flutter ablation for more permanent treatment => think he would not be very likely to hold long-term, recommend CPAP and weight loss to start with, will followup with him in EP clinic.    4. OSA: Severe.  Potentiates atrial arrhythmias.  - Retry CPAP.  5. Disposition: Stable for home from my standpoint.  We will make followup appointment in CHF clinic.  Cardiac meds for home: amiodarone 200 mg bid x 3 days then 200 mg daily after that, apixaban 5  mg bid, Coreg 6.25 mg bid, torsemide 80 mg bid, metolazone 2.5 mg once weekly, KCl 20 bid.   Length of Stay: 6  Marca Anconaalton McLean, MD  06/11/2017, 7:54 AM  Advanced Heart Failure Team Pager [phone] (M-F; 7a - 4p)  Please contact [facility] for night-coverage after hours (4p -7a ) and weekends on [email or web address]

## 2017-06-11 NOTE — Progress Notes (Signed)
Dr. Shirlee Latch in this am and explained to patient that he is discontinuing his diuretics due to kidney function results today.  Patient updated on plan of care.

## 2017-06-11 NOTE — Progress Notes (Signed)
   Subjective:   No acute events overnight.  He put additional 3.8 L last 24 hours and is net -14.3 L over the course of the admission.  Slight bump in creatinine this morning.  Denies any complaints and was encouraged to avoid salty foods and follow fluid restriction upon discharge.   Objective:  Vital signs in last 24 hours: Vitals:   06/10/17 1207 06/10/17 1646 06/10/17 1931 06/11/17 0458  BP: (!) 91/39 (!) 108/91 126/73 (!) 141/87  Pulse: 71  68 78  Resp: 18  18 18   Temp: 98.5 F (36.9 C)  98.5 F (36.9 C) 98 F (36.7 C)  TempSrc: Oral  Oral Oral  SpO2: 96%  97% 98%  Weight:    (!) 309 lb 3.2 oz (140.3 kg)  Height:       General: Obese male, sitting in chair comfortably, no acute distress CV: AICD L chest wall, no murmur, normal rate  Resp: Clear breath sounds, normal work of breathing, no distress  Abd: Soft, +BS, obese, non-tender  Extr: Improved LE edema  Neuro: Alert and oriented x3 Skin: Warm, dry, acrochordons to neck      Assessment/Plan:  HFrEF exacerbation (EF 25-30%, s/p ICD)  Patient presented with progressive chest tightness and dyspnea on exertion with increased swelling and edema on chest x-ray in the setting of nonadherence with fluid restriction, missed metolazone dose.  Estimated dry weight per patient around 320 pounds, weight has improved.  He has received IV diuresis with good response. Cardiology consulted, appreciate recommendations. He is now s/p cardioversion as below which should improve his HF and volume status.  He is net negative 14.3 L since admission and is stable for discharge.  --Transition to PO regimen starting this pm --Torsemide 80 mg BID with 2.5 mg metolazone once weekly --Strict I/Os, daily weights, fluid and salt restriction --Cont home Bidil TID  H/o Atrial Flutter s/p cardioversion (2017) Patient has a history of atrial flutter and cardioversion performed in 2017 and was found to be in atrial flutter on admission, likely  contributing to his HF exacerbation. Cardiology performed TEE with successful cardioversion, noted to be in Type 1 2nd degree AV block following and coreg was held. This has resolved, though he has persistent 1st degree AV block. Appreciate cardiology/EP assistance in management. He is on amiodarone at home, will continue BID dosing for seven days and resume daily dosing. EP evaluated, the pt needs improved OSA tx and weight loss prior to consideration for ablation therapies.    --Tele --Cont Amiodarone 200 mg BID  --Cont Eliquis  --Cont Coreg 6.25 mg BID   Acute on Chronic Kidney Injury (CKD IV)  Patient has a history of chronic kidney disease stage IV with baseline creatinine around 2.7-2.9.  Creatinine on admission elevated to 4.0.  Renal ultrasound on admission without obstruction.  Likely due to volume overload and has improved with diuresis before experiencing a slight bump (still within baseline) indicating he is likely at dry weight.  --BMP, replete electrolytes prn  Obstructive Sleep Apnea  Pt has a history of OSA that is not currently treated as he has not tolerated CPAP mask in the past. At home he reports sleeping in a kneeling position. His uncontrolled OSA is likely contributing to his comorbidities and may preclude ablation as described above.  --CPAP per RT   Dispo: Anticipated discharge in approximately 0-1 day(s).   Eric Carne, MD 06/11/2017, 10:32 AM

## 2017-06-18 ENCOUNTER — Ambulatory Visit (HOSPITAL_COMMUNITY)
Admission: RE | Admit: 2017-06-18 | Discharge: 2017-06-18 | Disposition: A | Discharge status: Home / Self Care | Payer: Medicaid Other | Source: Ambulatory Visit | Attending: Cardiology | Admitting: Cardiology

## 2017-06-18 ENCOUNTER — Ambulatory Visit (INDEPENDENT_AMBULATORY_CARE_PROVIDER_SITE_OTHER): Payer: Self-pay

## 2017-06-18 VITALS — BP 108/64 | HR 79 | Wt 310.0 lb

## 2017-06-18 DIAGNOSIS — N183 Chronic kidney disease, stage 3 unspecified: Secondary | ICD-10-CM

## 2017-06-18 DIAGNOSIS — E785 Hyperlipidemia, unspecified: Secondary | ICD-10-CM | POA: Insufficient documentation

## 2017-06-18 DIAGNOSIS — I484 Atypical atrial flutter: Secondary | ICD-10-CM | POA: Insufficient documentation

## 2017-06-18 DIAGNOSIS — I428 Other cardiomyopathies: Secondary | ICD-10-CM | POA: Diagnosis not present

## 2017-06-18 DIAGNOSIS — Z6841 Body Mass Index (BMI) 40.0 and over, adult: Secondary | ICD-10-CM

## 2017-06-18 DIAGNOSIS — G4733 Obstructive sleep apnea (adult) (pediatric): Secondary | ICD-10-CM | POA: Diagnosis not present

## 2017-06-18 DIAGNOSIS — I480 Paroxysmal atrial fibrillation: Secondary | ICD-10-CM | POA: Insufficient documentation

## 2017-06-18 DIAGNOSIS — I4892 Unspecified atrial flutter: Secondary | ICD-10-CM

## 2017-06-18 DIAGNOSIS — I5022 Chronic systolic (congestive) heart failure: Secondary | ICD-10-CM | POA: Diagnosis not present

## 2017-06-18 DIAGNOSIS — N529 Male erectile dysfunction, unspecified: Secondary | ICD-10-CM | POA: Diagnosis not present

## 2017-06-18 DIAGNOSIS — I130 Hypertensive heart and chronic kidney disease with heart failure and stage 1 through stage 4 chronic kidney disease, or unspecified chronic kidney disease: Secondary | ICD-10-CM | POA: Diagnosis present

## 2017-06-18 DIAGNOSIS — Z95810 Presence of automatic (implantable) cardiac defibrillator: Secondary | ICD-10-CM

## 2017-06-18 LAB — CBC
HEMATOCRIT: 40.5 % (ref 39.0–52.0)
HEMOGLOBIN: 12.6 g/dL — AB (ref 13.0–17.0)
MCH: 28.1 pg (ref 26.0–34.0)
MCHC: 31.1 g/dL (ref 30.0–36.0)
MCV: 90.4 fL (ref 78.0–100.0)
Platelets: 194 10*3/uL (ref 150–400)
RBC: 4.48 MIL/uL (ref 4.22–5.81)
RDW: 14.7 % (ref 11.5–15.5)
WBC: 10 10*3/uL (ref 4.0–10.5)

## 2017-06-18 LAB — BASIC METABOLIC PANEL
ANION GAP: 13 (ref 5–15)
BUN: 64 mg/dL — ABNORMAL HIGH (ref 6–20)
CALCIUM: 9.1 mg/dL (ref 8.9–10.3)
CHLORIDE: 96 mmol/L — AB (ref 101–111)
CO2: 32 mmol/L (ref 22–32)
Creatinine, Ser: 3.28 mg/dL — ABNORMAL HIGH (ref 0.61–1.24)
GFR calc non Af Amer: 20 mL/min — ABNORMAL LOW (ref >60)
GFR, EST AFRICAN AMERICAN: 23 mL/min — AB (ref >60)
Glucose, Bld: 111 mg/dL — ABNORMAL HIGH (ref 65–99)
POTASSIUM: 3.2 mmol/L — AB (ref 3.5–5.1)
Sodium: 141 mmol/L (ref 135–145)

## 2017-06-18 NOTE — Progress Notes (Signed)
EPIC Encounter for ICM Monitoring  Patient Name: Eric Murillo is a 56 y.o. male Date: 06/18/2017 Primary Care Physican: Zada Finders, MD Primary Cardiologist:McLean Electrophysiologist: Faustino Congress Weight:  309lbshospital discharge weight        No call to patient due to scheduled HF clinic appointment today for hospital follow up.  Hospitalized 06/05/2017 to 06/11/2017 for CHF exacerbation.   Thoracic impedance normal after hospital diuresis.  Prescribed dosage: Torsemide 20 mg 2 tablets (40 mg total) twice a day. Metolazone 2.5 mg every Wednesday. Potassium 20 mEq 2tablets (40 mEq total) and 1 tablet (20 mEq total) every PM (Potassium changed per lab note 06/18/2017).   Labs: 06/18/2017 Creatinine 3.28, BUN 64, Potassium 3.2, Sodium 141, EGFR 20-23 06/11/2017 Creatinine 2.92, BUN 50, Potassium 3.5, Sodium 140, EGFR 23-26  06/10/2017 Creatinine 2.75, BUN 46, Potassium 3.6, Sodium 140, EGFR 24-28  06/09/2017 Creatinine 2.88, BUN 50, Potassium 3.2, Sodium 141, EGFR 23-27  06/08/2017 Creatinine 2.96, BUN 56, Potassium 3.1, Sodium 142, EGFR 22-26  06/07/2017 Creatinine 3.06, BUN 64, Potassium 3.6, Sodium 143, EGFR 21-25  06/06/2017 Creatinine 3.33, BUN 63, Potassium 4.0, Sodium 142, EGFR 19-22  06/05/2017 Creatinine 3.57, BUN 62, Potassium 3.7, Sodium 140, EGFR 18-20  06/04/2017 Creatinine 4.02, BUN 63, Potassium 4.1, Sodium 140, EGFR 15-18  04/10/2017 Creatinine 2.71, BUN 30, Potassium 3.5, Sodium 140, EGFR 25-29 04/03/2017 Creatinine 2.41, BUN 27, Potassium 3.6, Sodium 142, EGFR 28-33 03/12/2017 Creatinine 3.66, BUN 43, Potassium 3.7, Sodium 138, EGFR 17-20 02/07/2017 Creatinine 2.80, BUN 33, Potassium 4.0, Sodium 143, EGFR 24-28 01/12/2017 Creatinine 2.92, BUN 43, Potassium 3.5, Sodium 140, EGFR 23-26       Due to a large number of results some have not been displayed. A complete set of results can be found in Results Review   Recommendations: No changes.    Follow-up  plan: ICM clinic phone appointment on 07/05/2017.    Copy of ICM check sent to Dr. Caryl Comes.   3 month ICM trend: 06/18/2017    1 Year ICM trend:       Rosalene Billings, RN 06/18/2017 11:12 AM
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
## 2017-06-18 NOTE — Patient Instructions (Signed)
Routine lab work today. Will notify you of abnormal results, otherwise no news is good news!  EKG today.  Follow up 4 weeks with Otilio Saber PA-C.  Take all medication as prescribed the day of your appointment. Bring all medications with you to your appointment.  Do the following things EVERYDAY: 1) Weigh yourself in the morning before breakfast. Write it down and keep it in a log. 2) Take your medicines as prescribed 3) Eat low salt foods-Limit salt (sodium) to 2000 mg per day.  4) Stay as active as you can everyday 5) Limit all fluids for the day to less than 2 liters

## 2017-06-19 ENCOUNTER — Other Ambulatory Visit: Payer: Self-pay

## 2017-06-19 ENCOUNTER — Encounter: Payer: Self-pay | Admitting: Internal Medicine

## 2017-06-19 ENCOUNTER — Telehealth (HOSPITAL_COMMUNITY): Payer: Self-pay

## 2017-06-19 ENCOUNTER — Ambulatory Visit (INDEPENDENT_AMBULATORY_CARE_PROVIDER_SITE_OTHER): Payer: Medicaid Other | Admitting: Internal Medicine

## 2017-06-19 VITALS — BP 113/76 | HR 76 | Temp 98.2°F | Wt 315.4 lb

## 2017-06-19 DIAGNOSIS — I5022 Chronic systolic (congestive) heart failure: Secondary | ICD-10-CM

## 2017-06-19 DIAGNOSIS — Z23 Encounter for immunization: Secondary | ICD-10-CM | POA: Diagnosis not present

## 2017-06-19 DIAGNOSIS — I484 Atypical atrial flutter: Secondary | ICD-10-CM

## 2017-06-19 MED ORDER — POTASSIUM CHLORIDE CRYS ER 20 MEQ PO TBCR: EXTENDED_RELEASE_TABLET | ORAL | 6 refills | Status: DC

## 2017-06-19 NOTE — Telephone Encounter (Signed)
Result Notes for Basic metabolic panel   Notes recorded by Chyrl Civatte, RN on 06/19/2017 at 11:54 AM EST Patient did not answer, no VM available ------  Notes recorded by Theresia Bough, CMA on 06/18/2017 at 12:18 PM EST Unable to reach patient. No answer unable to leave message [phone]  ------  Notes recorded by Graciella Freer, PA-C on 06/18/2017 at 11:16 AM EST Have take K 40 meq BID today,   And then change chronic dose to K to 40 meq q am and 20 meq q pm and recheck BMET next week.    Casimiro Needle [address] [place], PA-C 06/18/2017 11:15 AM

## 2017-06-19 NOTE — Progress Notes (Signed)
   CC: Follow up after hospitalization for heart failure exacerbation  HPI:  [name] is a 56 y.o. male with PMHx detailed below presenting for follow up after recent hospitalization for heart failure exacerbation.  See problem based assessment and plan below for additional details.  Chronic systolic heart failure (HCC) He was discharged with a weight of approximately 309 lbs. This has been stable at home and at cardiology clinic follow up yesterday. Today he is 315 on scale at [facility] but has not noticed any significant change in swelling in his legs. He is taking the torsemide on a sliding scale as instructed at cardiology, doing 80mg  BID (20mg  extra at PM dose) when weight is up. He continues to take metolazone on Wednesdays and notices a good urine output. Labs were checked yesterday, I do not see any need for a change in treatment or repeat testing today. He had mild hypokalemia at 3.2 which is probably due to increased home potassium wasting diuretics. Plan Continue current medications- torsemide, metolazone, spironolactone, bidil Continue limiting salt intake RTC in December for BMP, may need potassium supplementation if he remains low or is worse  Atrial flutter (HCC) He has not felt any palpitations. His heart rate is regular on examination today. He has no bleeding or easy bruising on Eliquis.  Health care maintenance Flu vaccine provided today    Past Medical History:  Diagnosis Date  . AICD (automatic cardioverter/defibrillator) present   . Anemia of chronic disease   . Arthritis   . Atrial flutter (HCC)    DCCV 9/15  . Chest pain on exertion   . Chronic systolic congestive heart failure, NYHA class 2 (HCC)    EF 20-25% 1/16  . CKD (chronic kidney disease) stage 3, GFR 30-59 ml/min (HCC)   . Dyslipidemia   . Gout    Of big toe  . Hyperglycemia   . Hyperlipidemia   . Hypertension   . Morbid obesity with BMI of 45.0-49.9, adult (HCC)   . Non-ischemic  cardiomyopathy (HCC)   . Organic erectile dysfunction   . OSA (obstructive sleep apnea)   . Pilonidal cyst   . Submandibular sialolithiasis    Right    Review of Systems: Review of Systems  Constitutional: Negative for malaise/fatigue.  Respiratory: Positive for shortness of breath.   Cardiovascular: Positive for leg swelling. Negative for chest pain.  Genitourinary: Positive for frequency.  Musculoskeletal: Negative for falls.  Skin: Negative for rash.  Neurological: Negative for sensory change.     Physical Exam: Vitals:   06/19/17 1021  BP: 113/76  Pulse: 76  Temp: 98.2 F (36.8 C)  TempSrc: Oral  SpO2: 98%  Weight: (!) 315 lb 6.4 oz (143.1 kg)   GENERAL- alert, co-operative, NAD HEENT- Oral mucosa appears moist, CARDIAC- RRR, no murmurs, rubs or gallops. RESP- CTAB, no wheezes or crackles. EXTREMITIES- 1+ pedal edema bilaterally SKIN- Warm, dry, No rash or lesion. PSYCH- Normal mood and affect, appropriate thought content and speech.   Assessment & Plan:   See encounters tab for problem based medical decision making.   Patient discussed with Dr. Criselda Peaches

## 2017-06-19 NOTE — Patient Instructions (Addendum)
FOLLOW-UP INSTRUCTIONS When: December 5 with Dr. Allena Katz For: kidney function, potassium What to bring:   It was a pleasure to meet you today Eric Murillo. You just had tests checked yesterday and we do not need to repeat these. Your potassium is slightly low which may be from the fluid medicines. We should check this again in December as you might need to start a replacement pill.

## 2017-06-19 NOTE — Telephone Encounter (Signed)
Result Notes for Basic metabolic panel   Notes recorded by Chyrl Civatte, RN on 06/19/2017 at 12:17 PM EST Patient aware and agreeable, lab order sent to Dr. Eliane Decree office to recheck BMET during his apt next week (12/5) ------  Notes recorded by Chyrl Civatte, RN on 06/19/2017 at 11:54 AM EST Patient did not answer, no VM available ------  Notes recorded by Theresia Bough, CMA on 06/18/2017 at 12:18 PM EST Unable to reach patient. No answer unable to leave message [phone]  ------  Notes recorded by Graciella Freer, PA-C on 06/18/2017 at 11:16 AM EST Have take K 40 meq BID today,   And then change chronic dose to K to 40 meq q am and 20 meq q pm and recheck BMET next week.    Eric Murillo [address]" [place], PA-C 06/18/2017 11:15 AM

## 2017-06-20 NOTE — Telephone Encounter (Signed)
In office 11/27, will continue to f/u

## 2017-06-21 NOTE — Assessment & Plan Note (Signed)
Flu vaccine provided today.  

## 2017-06-21 NOTE — Assessment & Plan Note (Signed)
He has not felt any palpitations. His heart rate is regular on examination today. He has no bleeding or easy bruising on Eliquis.

## 2017-06-21 NOTE — Progress Notes (Signed)
Internal Medicine Clinic Attending  Case discussed with Dr. Rice at the time of the visit.  We reviewed the resident's history and exam and pertinent patient test results.  I agree with the assessment, diagnosis, and plan of care documented in the resident's note.  

## 2017-06-21 NOTE — Assessment & Plan Note (Addendum)
He was discharged with a weight of approximately 309 lbs. This has been stable at home and at cardiology clinic follow up yesterday. Today he is 315 on scale at [facility] but has not noticed any significant change in swelling in his legs. He is taking the torsemide on a sliding scale as instructed at cardiology, doing 80mg  BID (20mg  extra at PM dose) when weight is up. He continues to take metolazone on Wednesdays and notices a good urine output. Labs were checked yesterday, I do not see any need for a change in treatment or repeat testing today. He had mild hypokalemia at 3.2 which is probably due to increased home potassium wasting diuretics. Plan Continue current medications- torsemide, metolazone, spironolactone, bidil Continue limiting salt intake RTC in December for BMP, may need potassium supplementation if he remains low or is worse

## 2017-06-27 ENCOUNTER — Encounter: Payer: Self-pay | Admitting: Internal Medicine

## 2017-06-27 ENCOUNTER — Other Ambulatory Visit: Payer: Self-pay

## 2017-06-27 ENCOUNTER — Ambulatory Visit (INDEPENDENT_AMBULATORY_CARE_PROVIDER_SITE_OTHER): Payer: Medicaid Other | Admitting: Internal Medicine

## 2017-06-27 VITALS — BP 118/78 | HR 90 | Temp 98.0°F | Wt 317.3 lb

## 2017-06-27 DIAGNOSIS — I130 Hypertensive heart and chronic kidney disease with heart failure and stage 1 through stage 4 chronic kidney disease, or unspecified chronic kidney disease: Secondary | ICD-10-CM

## 2017-06-27 DIAGNOSIS — M109 Gout, unspecified: Secondary | ICD-10-CM

## 2017-06-27 DIAGNOSIS — I4892 Unspecified atrial flutter: Secondary | ICD-10-CM | POA: Diagnosis not present

## 2017-06-27 DIAGNOSIS — Z7901 Long term (current) use of anticoagulants: Secondary | ICD-10-CM

## 2017-06-27 DIAGNOSIS — I484 Atypical atrial flutter: Secondary | ICD-10-CM

## 2017-06-27 DIAGNOSIS — E669 Obesity, unspecified: Secondary | ICD-10-CM

## 2017-06-27 DIAGNOSIS — G4733 Obstructive sleep apnea (adult) (pediatric): Secondary | ICD-10-CM

## 2017-06-27 DIAGNOSIS — M1990 Unspecified osteoarthritis, unspecified site: Secondary | ICD-10-CM

## 2017-06-27 DIAGNOSIS — E1122 Type 2 diabetes mellitus with diabetic chronic kidney disease: Secondary | ICD-10-CM

## 2017-06-27 DIAGNOSIS — Z79899 Other long term (current) drug therapy: Secondary | ICD-10-CM

## 2017-06-27 DIAGNOSIS — Z6841 Body Mass Index (BMI) 40.0 and over, adult: Secondary | ICD-10-CM | POA: Diagnosis not present

## 2017-06-27 DIAGNOSIS — Z87828 Personal history of other (healed) physical injury and trauma: Secondary | ICD-10-CM | POA: Diagnosis not present

## 2017-06-27 DIAGNOSIS — N184 Chronic kidney disease, stage 4 (severe): Secondary | ICD-10-CM

## 2017-06-27 DIAGNOSIS — I5022 Chronic systolic (congestive) heart failure: Secondary | ICD-10-CM | POA: Diagnosis not present

## 2017-06-27 DIAGNOSIS — Z95810 Presence of automatic (implantable) cardiac defibrillator: Secondary | ICD-10-CM

## 2017-06-27 DIAGNOSIS — I428 Other cardiomyopathies: Secondary | ICD-10-CM | POA: Diagnosis not present

## 2017-06-27 LAB — BASIC METABOLIC PANEL
Anion gap: 11 (ref 5–15)
BUN: 50 mg/dL — AB (ref 6–20)
CHLORIDE: 97 mmol/L — AB (ref 101–111)
CO2: 34 mmol/L — ABNORMAL HIGH (ref 22–32)
CREATININE: 2.97 mg/dL — AB (ref 0.61–1.24)
Calcium: 9.4 mg/dL (ref 8.9–10.3)
GFR, EST AFRICAN AMERICAN: 26 mL/min — AB (ref >60)
GFR, EST NON AFRICAN AMERICAN: 22 mL/min — AB (ref >60)
Glucose, Bld: 108 mg/dL — ABNORMAL HIGH (ref 65–99)
POTASSIUM: 3.3 mmol/L — AB (ref 3.5–5.1)
SODIUM: 142 mmol/L (ref 135–145)

## 2017-06-27 LAB — MAGNESIUM: MAGNESIUM: 2.7 mg/dL — AB (ref 1.7–2.4)

## 2017-06-27 MED ORDER — TORSEMIDE 20 MG PO TABS: 80.0000 mg | ORAL_TABLET | Freq: Two times a day (BID) | ORAL | Status: DC

## 2017-06-27 MED ORDER — AMIODARONE HCL 200 MG PO TABS: 200.0000 mg | ORAL_TABLET | Freq: Every day | ORAL | 1 refills | Status: DC

## 2017-06-27 MED ORDER — POTASSIUM CHLORIDE CRYS ER 20 MEQ PO TBCR: 40.0000 meq | EXTENDED_RELEASE_TABLET | Freq: Two times a day (BID) | ORAL | 6 refills | Status: DC
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
## 2017-06-27 NOTE — Patient Instructions (Signed)
It was a pleasure to see you Eric Murillo.  Please take Torsemide 80 mg twice a day. (4 tablets twice a day)  Please increase your potassium to 40 mEq twice a day (2 pills twice a day)  Please take Amiodarone 200 mg once daily.  Please continue your other medications as prescribed.  Consider the nasal pillows equipment for your sleep apnea.  Please follow up with [place] in about 2 weeks for a recheck.

## 2017-07-02 NOTE — Assessment & Plan Note (Signed)
Patient was admitted last month for his heart failure and atrial flutter. His discharge weight was 309 lbs. He was instructed to increase his Torsemide to 80 mg BID however has only been taking 40 mg BID. He has been taking Metolazone 2.5 mg every Wednesday. He reports good urine output. He reports adherence to his Coreg 6.25 mg BID, Bidil 20-37.5 mg TID, and has been taking Potassium 20 mEq bid. His weight this visit is 317 lbs. He has noticed increased swelling in his legs without significant change in breathing. He does appear volume overloaded on exam without respiratory distress. Potassium is slightly low at 3.3 this visit. Plan: - Increase Torsemide to 80 mg BID - Increase Potassium to 40 mEq BID - Continue Metolazone 2.5 mg weekly - Continue Coreg 6.25 mg BID - Continue Bidil 20-37.5 mg TID - BMET this visit shows renal function at his baseline - f/u within 2 weeks for reassessment

## 2017-07-02 NOTE — Assessment & Plan Note (Signed)
He has CKD 4 which is at his baseline this visit. He reports good urine output. Will need close monitoring of renal function while on Torsemide. Plan: F/u with Nephrology, Dr. Hyman Hopes.

## 2017-07-02 NOTE — Assessment & Plan Note (Signed)
He underwent repeat DCCV last month with return to sinus rhythm. He has not felt any palpitations and continues Eliquis twice daily. He denies any obvious bleeding. He has a regular rhythm and rate on exam this visit. He has been off of Amiodarone which was started his last admission. Plan: - Continue Eliquis 5 mg BID - Amiodarone 200 mg daily - Continue Coreg 6.25 mg BID - f/u with cardiology
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
## 2017-07-03 ENCOUNTER — Encounter (HOSPITAL_COMMUNITY): Payer: Medicaid Other

## 2017-07-05 ENCOUNTER — Ambulatory Visit (INDEPENDENT_AMBULATORY_CARE_PROVIDER_SITE_OTHER): Payer: Medicaid Other

## 2017-07-05 ENCOUNTER — Ambulatory Visit (INDEPENDENT_AMBULATORY_CARE_PROVIDER_SITE_OTHER): Payer: Medicaid Other | Admitting: *Deleted

## 2017-07-05 DIAGNOSIS — I5022 Chronic systolic (congestive) heart failure: Secondary | ICD-10-CM

## 2017-07-05 DIAGNOSIS — I428 Other cardiomyopathies: Secondary | ICD-10-CM

## 2017-07-05 DIAGNOSIS — Z95810 Presence of automatic (implantable) cardiac defibrillator: Secondary | ICD-10-CM

## 2017-07-05 NOTE — Progress Notes (Signed)
Remote ICD transmission.   

## 2017-07-05 NOTE — Progress Notes (Signed)
EPIC Encounter for ICM Monitoring  Patient Name: Eric Murillo is a 56 y.o. male Date: 07/05/2017 Primary Care Physican: Zada Finders, MD Primary Cardiologist:[place] Electrophysiologist: Faustino Congress Weight:315 lbs (2 days ago)       Heart Failure questions reviewed, pt has bilateral ankle swelling.  PCP note shows increased Torsemide to 80 mg twice a day and Potassium 40 mEq twice day at 12/5 OV because he was fluid overloaded by exam.    Hospitalized 06/05/2017 to 06/11/2017 for CHF exacerbation..    Thoracic impedance abnormal suggesting fluid accumulation since 06/20/2017.  Prescribed dosage: Torsemide 20 mg take 4 tablets (80 mg total) twice a day (increased on 12/5).  Not taking as PCP prescribed on 12/5. He is taking Torsemide 80 mg AM and 60 mg PM.  Potassium 20 mEq take 2 tablets (40 mEq total) twice a day and he is taking as prescribed    Metolazone 2.5 mg take 1 tablet every Wednesday and is taking as prescribed  Labs: 06/27/2017 Creatinine 2.97, BUN 50, Potassium 3.3, Sodium 142, EGFR 22-26 06/18/2017 Creatinine 3.28, BUN 64, Potassium 3.2, Sodium 141, EGFR 20-23 06/11/2017 Creatinine 2.92, BUN 50, Potassium 3.5, Sodium 140, EGFR 23-26  06/10/2017 Creatinine 2.75, BUN 46, Potassium 3.6, Sodium 140, EGFR 24-28  06/09/2017 Creatinine 2.88, BUN 50, Potassium 3.2, Sodium 141, EGFR 23-27  06/08/2017 Creatinine 2.96, BUN 56, Potassium 3.1, Sodium 142, EGFR 22-26  06/07/2017 Creatinine 3.06, BUN 64, Potassium 3.6, Sodium 143, EGFR 21-25  06/06/2017 Creatinine 3.33, BUN 63, Potassium 4.0, Sodium 142, EGFR 19-22  06/05/2017 Creatinine 3.57, BUN 62, Potassium 3.7, Sodium 140, EGFR 18-20  06/04/2017 Creatinine 4.02, BUN 63, Potassium 4.1, Sodium 140, EGFR 15-18  04/10/2017 Creatinine 2.71, BUN 30, Potassium 3.5, Sodium 140, EGFR 25-29 04/03/2017 Creatinine 2.41, BUN 27, Potassium 3.6, Sodium 142, EGFR 28-33 03/12/2017 Creatinine 3.66, BUN 43, Potassium 3.7, Sodium 138, EGFR  17-20 02/07/2017 Creatinine 2.80, BUN 33, Potassium 4.0, Sodium 143, EGFR 24-28 01/12/2017 Creatinine 2.92, BUN 43, Potassium 3.5, Sodium 140, EGFR 23-26       Due to a large number of results some have not been displayed. A complete set of results can be found in Results Review   Recommendations:  Copy sent to Dr Aundra Dubin and Dr Caryl Comes for review and recommendations if needed.     Advised to use ER for any urgent symptoms.  Follow-up plan: ICM clinic phone appointment on 07/12/2017 (manual send).  Office appointment scheduled 07/26/2017 with HF NP/PA and 07/31/2017 with Dr Curt Bears.  Kidney appointment 07/28/2017  3 month ICM trend: 07/05/2017    1 Year ICM trend:       Rosalene Billings, RN 07/05/2017 9:35 AM

## 2017-07-08 NOTE — Progress Notes (Signed)
Increase torsemide to 80 mg bid and take 2 doses of metolazone this week instead of 1 (separate by 2 days).

## 2017-07-09 MED ORDER — TORSEMIDE 20 MG PO TABS: ORAL_TABLET | ORAL | 3 refills | Status: DC

## 2017-07-09 NOTE — Progress Notes (Signed)
Call to patient.  Advised Dr Shirlee Latch ordered torsemide 20 mg take 4 tablets (80 mg total) twice a day.  Take Metolazone 2.5 mg 1 tablet twice this week instead of once this week only (separate by 2 days).  Advised to take 1 tablet Tuesday morning and 1 tablet Friday morning.  Next week he should return to taking Metolazone 2.5 tablet every Wednesday as prescribed.   He verbalized understanding.  He will send a remote transmission on 12/21 afternoon after he takes Metolazone.  Has has enough Torsemide on hand and does not need it filled today.   He reported ankles are still swollen today.

## 2017-07-10 ENCOUNTER — Encounter: Payer: Self-pay | Admitting: Cardiology

## 2017-07-13 ENCOUNTER — Telehealth: Payer: Self-pay | Admitting: Cardiology

## 2017-07-13 ENCOUNTER — Ambulatory Visit (INDEPENDENT_AMBULATORY_CARE_PROVIDER_SITE_OTHER): Payer: Self-pay

## 2017-07-13 DIAGNOSIS — Z95810 Presence of automatic (implantable) cardiac defibrillator: Secondary | ICD-10-CM

## 2017-07-13 DIAGNOSIS — I5022 Chronic systolic (congestive) heart failure: Secondary | ICD-10-CM

## 2017-07-13 NOTE — Telephone Encounter (Signed)
Spoke with pt and reminded pt of remote transmission that is due today. Pt verbalized understanding.   

## 2017-07-13 NOTE — Progress Notes (Signed)
EPIC Encounter for ICM Monitoring  Patient Name: Eric Murillo is a 56 y.o. male Date: 07/13/2017 Primary Care Physican: Zada Finders, MD Primary Cardiologist:[place] Electrophysiologist: Faustino Congress Weight:313 lbs            Heart Failure questions reviewed, pt stated he is feeling fine. He lost a few pounds after taking extra medication.   Thoracic impedance returned to normal after increase in Torsemide and taking extra Metolazone as ordered on 07/05/2017.   Prescribed dosage: Torsemide 20 mg take 4 tablets (80 mg total) twice a day.  Take Metolazone 2.5 mg 1 tablet every Wednesday as prescribed.    Potassium 20 mEq take 2 tablets (40 mEq total) twice a day and he is taking as prescribed     Labs: 06/27/2017 Creatinine 2.97, BUN 50, Potassium 3.3, Sodium 142, EGFR 22-26 06/18/2017 Creatinine3.28, BUN64, Potassium3.2, Sodium141, [id number] 06/11/2017 Creatinine2.92, BUN50, Potassium3.5, Sodium140, [id number]  06/10/2017 Creatinine2.75, BUN46, Potassium3.6, Sodium140, [id number]  06/09/2017 Creatinine2.88, BUN50, Potassium3.2, Sodium141, [id number]  06/08/2017 Creatinine2.96, BUN56, Potassium3.1, Sodium142, [id number]  06/07/2017 Creatinine3.06, BUN64, Potassium3.6, [id number], [id number]  06/06/2017 Creatinine3.33, BUN63, Potassium4.0, [id number], [id number]  06/05/2017 Creatinine3.57, BUN62, Potassium3.7, Sodium140, [id number]  06/04/2017 Creatinine4.02, BUN63, Potassium4.1, Sodium140, [id number] 04/10/2017 Creatinine 2.71, BUN 30, Potassium 3.5, Sodium 140, EGFR 25-29 04/03/2017 Creatinine 2.41, BUN 27, Potassium 3.6, Sodium 142, EGFR 28-33 03/12/2017 Creatinine 3.66, BUN 43, Potassium 3.7, Sodium 138, EGFR 17-20 02/07/2017 Creatinine 2.80, BUN 33, Potassium 4.0, Sodium 143, EGFR 24-28 01/12/2017 Creatinine 2.92, BUN 43, Potassium 3.5, Sodium 140, EGFR 23-26  Recommendations: No changes.  Encouraged to call for fluid symptoms.  Follow-up  plan: ICM clinic phone appointment on 09/03/2017.  Office appointment scheduled 07/26/2017 with HF NP/PA and 07/31/2017 with Dr Curt Bears.  Kidney appointment 07/28/2017  Copy of ICM check sent to Dr. Caryl Comes.   3 month ICM trend: 07/13/2017    1 Year ICM trend:       Rosalene Billings, RN 07/13/2017 3:22 PM
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
## 2017-07-25 NOTE — Progress Notes (Signed)
Internal Medicine Clinic Attending  Case discussed with Dr. Patel at the time of the visit.  We reviewed the resident's history and exam and pertinent patient test results.  I agree with the assessment, diagnosis, and plan of care documented in the resident's note.  

## 2017-07-26 ENCOUNTER — Encounter (HOSPITAL_COMMUNITY): Payer: Self-pay

## 2017-07-26 ENCOUNTER — Ambulatory Visit (HOSPITAL_COMMUNITY)
Admission: RE | Admit: 2017-07-26 | Discharge: 2017-07-26 | Disposition: A | Discharge status: Home / Self Care | Payer: Medicaid Other | Source: Ambulatory Visit | Attending: Cardiology | Admitting: Cardiology

## 2017-07-26 ENCOUNTER — Telehealth (HOSPITAL_COMMUNITY): Payer: Self-pay | Admitting: *Deleted

## 2017-07-26 ENCOUNTER — Telehealth (HOSPITAL_COMMUNITY): Payer: Self-pay

## 2017-07-26 VITALS — BP 118/86 | HR 94 | Wt 321.0 lb

## 2017-07-26 DIAGNOSIS — E785 Hyperlipidemia, unspecified: Secondary | ICD-10-CM | POA: Diagnosis not present

## 2017-07-26 DIAGNOSIS — M109 Gout, unspecified: Secondary | ICD-10-CM | POA: Insufficient documentation

## 2017-07-26 DIAGNOSIS — G4733 Obstructive sleep apnea (adult) (pediatric): Secondary | ICD-10-CM | POA: Insufficient documentation

## 2017-07-26 DIAGNOSIS — I5022 Chronic systolic (congestive) heart failure: Secondary | ICD-10-CM | POA: Insufficient documentation

## 2017-07-26 DIAGNOSIS — I480 Paroxysmal atrial fibrillation: Secondary | ICD-10-CM | POA: Diagnosis not present

## 2017-07-26 DIAGNOSIS — N183 Chronic kidney disease, stage 3 unspecified: Secondary | ICD-10-CM

## 2017-07-26 DIAGNOSIS — I429 Cardiomyopathy, unspecified: Secondary | ICD-10-CM | POA: Diagnosis not present

## 2017-07-26 DIAGNOSIS — Z79899 Other long term (current) drug therapy: Secondary | ICD-10-CM | POA: Diagnosis not present

## 2017-07-26 DIAGNOSIS — I130 Hypertensive heart and chronic kidney disease with heart failure and stage 1 through stage 4 chronic kidney disease, or unspecified chronic kidney disease: Secondary | ICD-10-CM | POA: Diagnosis not present

## 2017-07-26 DIAGNOSIS — Z6841 Body Mass Index (BMI) 40.0 and over, adult: Secondary | ICD-10-CM | POA: Diagnosis not present

## 2017-07-26 DIAGNOSIS — Z7901 Long term (current) use of anticoagulants: Secondary | ICD-10-CM | POA: Diagnosis not present

## 2017-07-26 LAB — BASIC METABOLIC PANEL
Anion gap: 12 (ref 5–15)
BUN: 43 mg/dL — AB (ref 6–20)
CO2: 31 mmol/L (ref 22–32)
CREATININE: 2.91 mg/dL — AB (ref 0.61–1.24)
Calcium: 9.2 mg/dL (ref 8.9–10.3)
Chloride: 97 mmol/L — ABNORMAL LOW (ref 101–111)
GFR, EST AFRICAN AMERICAN: 26 mL/min — AB (ref >60)
GFR, EST NON AFRICAN AMERICAN: 23 mL/min — AB (ref >60)
Glucose, Bld: 106 mg/dL — ABNORMAL HIGH (ref 65–99)
Potassium: 3.5 mmol/L (ref 3.5–5.1)
SODIUM: 140 mmol/L (ref 135–145)

## 2017-07-26 LAB — BRAIN NATRIURETIC PEPTIDE: B NATRIURETIC PEPTIDE 5: 399.7 pg/mL — AB (ref 0.0–100.0)

## 2017-07-26 MED ORDER — FUROSEMIDE 10 MG/ML IJ SOLN
80.0000 mg | Freq: Once | INTRAMUSCULAR | Status: AC
  Administered 2017-07-26: 80 mg via INTRAVENOUS
  Filled 2017-07-26: qty 8

## 2017-07-26 MED ORDER — POTASSIUM CHLORIDE CRYS ER 20 MEQ PO TBCR: 40.0000 meq | EXTENDED_RELEASE_TABLET | Freq: Three times a day (TID) | ORAL | 6 refills | Status: DC

## 2017-07-26 MED ORDER — POTASSIUM CHLORIDE CRYS ER 20 MEQ PO TBCR
20.0000 meq | EXTENDED_RELEASE_TABLET | Freq: Once | ORAL | Status: AC
  Administered 2017-07-26: 20 meq via ORAL
  Filled 2017-07-26: qty 1

## 2017-07-26 MED ORDER — TORSEMIDE 20 MG PO TABS
100.0000 mg | ORAL_TABLET | Freq: Two times a day (BID) | ORAL | 6 refills | Status: DC
Start: 2017-07-26 — End: 2017-11-22

## 2017-07-26 NOTE — Progress Notes (Addendum)
22g PIV inserted to RAC per Amy Clegg NP-C VO for IV lasix administration. 80 mg lasix pushed per protocol, saline locked. Patient given 20 meq PO potassium to take as well. Call bell and urinal in reach, will continue to monitor closely. Patient tolerated well.  TOTAL UOP: 500 CLEAR YELLOW URINE  PIV removed, clean dry gauze bandage applied, patient discharged from clinic appt.  Ave Filter, RN

## 2017-07-26 NOTE — Telephone Encounter (Signed)
CHF Clinic appointment reminder call placed to patient for upcoming appointment. No answer, no VM available.  Elige Radon, Bettina Gavia

## 2017-07-26 NOTE — Telephone Encounter (Signed)
Result Notes for Basic metabolic panel   Notes recorded by Georgina Peer, RN on 07/26/2017 at 4:26 PM EST Patient called back and he is agreeable with plan. Medication list updated. ------  Notes recorded by Theresia Bough, CMA on 07/26/2017 at 2:56 PM EST Left message for patient to call back.  ------  Notes recorded by Tonye Becket D, NP on 07/26/2017 at 1:47 PM EST Potassium low increase K to 40 meq three times a day.

## 2017-07-26 NOTE — Patient Instructions (Signed)
INCREASE Torsemide to 100 mg (5 tabs) twice daily.  Follow up 1 week with Otilio Saber PA-C.  Take all medication as prescribed the day of your appointment. Bring all medications with you to your appointment.  Do the following things EVERYDAY: 1) Weigh yourself in the morning before breakfast. Write it down and keep it in a log. 2) Take your medicines as prescribed 3) Eat low salt foods-Limit salt (sodium) to 2000 mg per day.  4) Stay as active as you can everyday 5) Limit all fluids for the day to less than 2 liters

## 2017-07-27 LAB — CUP PACEART REMOTE DEVICE CHECK
HIGH POWER IMPEDANCE MEASURED VALUE: 74 Ohm
HighPow Impedance: 74 Ohm
Implantable Lead Location: 753860
Implantable Pulse Generator Implant Date: 20161028
Lead Channel Impedance Value: 390 Ohm
Lead Channel Pacing Threshold Pulse Width: 0.5 ms
Lead Channel Setting Pacing Pulse Width: 0.5 ms
MDC IDC LEAD IMPLANT DT: 20161028
MDC IDC LEAD SERIAL: 332395
MDC IDC MSMT BATTERY REMAINING LONGEVITY: 83 mo
MDC IDC MSMT BATTERY REMAINING PERCENTAGE: 82 %
MDC IDC MSMT BATTERY VOLTAGE: 2.99 V
MDC IDC MSMT LEADCHNL RV PACING THRESHOLD AMPLITUDE: 1 V
MDC IDC MSMT LEADCHNL RV SENSING INTR AMPL: 12 mV
MDC IDC SESS DTM: 20181213090017
MDC IDC SET LEADCHNL RV PACING AMPLITUDE: 2.5 V
MDC IDC SET LEADCHNL RV SENSING SENSITIVITY: 0.5 mV
MDC IDC STAT BRADY RV PERCENT PACED: 1 %
Pulse Gen Serial Number: 7305918

## 2017-07-31 ENCOUNTER — Ambulatory Visit (INDEPENDENT_AMBULATORY_CARE_PROVIDER_SITE_OTHER): Payer: Medicaid Other | Admitting: Cardiology

## 2017-07-31 ENCOUNTER — Encounter: Payer: Self-pay | Admitting: Cardiology

## 2017-07-31 VITALS — BP 130/80 | HR 92 | Ht 69.5 in | Wt 320.0 lb

## 2017-07-31 DIAGNOSIS — G4733 Obstructive sleep apnea (adult) (pediatric): Secondary | ICD-10-CM | POA: Diagnosis not present

## 2017-07-31 DIAGNOSIS — I428 Other cardiomyopathies: Secondary | ICD-10-CM | POA: Diagnosis not present

## 2017-07-31 DIAGNOSIS — I484 Atypical atrial flutter: Secondary | ICD-10-CM

## 2017-07-31 LAB — CUP PACEART INCLINIC DEVICE CHECK
Date Time Interrogation Session: 20190108101328
HighPow Impedance: 70.875
Implantable Lead Implant Date: 20161028
Implantable Lead Location: 753860
Implantable Lead Serial Number: 332395
Implantable Pulse Generator Implant Date: 20161028
Lead Channel Impedance Value: 387.5 Ohm
Lead Channel Pacing Threshold Amplitude: 1 V
Lead Channel Pacing Threshold Pulse Width: 0.5 ms
Lead Channel Setting Pacing Amplitude: 2.5 V
MDC IDC MSMT BATTERY REMAINING LONGEVITY: 85 mo
MDC IDC MSMT LEADCHNL RV PACING THRESHOLD AMPLITUDE: 1 V
MDC IDC MSMT LEADCHNL RV PACING THRESHOLD PULSEWIDTH: 0.5 ms
MDC IDC MSMT LEADCHNL RV SENSING INTR AMPL: 12 mV
MDC IDC SET LEADCHNL RV PACING PULSEWIDTH: 0.5 ms
MDC IDC SET LEADCHNL RV SENSING SENSITIVITY: 0.5 mV
MDC IDC STAT BRADY RV PERCENT PACED: 0.03 %
Pulse Gen Serial Number: 7305918

## 2017-07-31 NOTE — Patient Instructions (Signed)
Medication Instructions:  Your physician recommends that you continue on your current medications as directed. Please refer to the Current Medication list given to you today.  * If you need a refill on your cardiac medications before your next appointment, please call your pharmacy. *  Labwork: None ordered  Testing/Procedures: None ordered  Follow-Up: No follow up is needed at this time with Dr. Elberta Fortis.  He will see you on an as needed basis.  Make a follow up appointment in April with Dr. Graciela Husbands.   Thank you for choosing [facility]!!   Dory Horn, RN [phone]  Any Other Special Instructions Will Be Listed Below (If Applicable).

## 2017-07-31 NOTE — Progress Notes (Signed)
Electrophysiology Office Note   Date:  07/31/2017   ID:  Eric Murillo, DOB 1961-07-12, MRN [id number]  PCP:  Darreld Mclean, MD  Cardiologist:  Chipper Oman Electrophysiologist:  Graciela Husbands    Chief Complaint  Patient presents with  . Defib Check    ATypical AFlutter/     History of Present Illness: Eric Murillo is a 57 y.o. male who is being seen today for the evaluation of atrial flutter at the request of Shirlee Latch. Presenting today for electrophysiology evaluation.  He has a history of chronic CHF due to nonischemic cardiomyopathy with an ICD in place, untreated sleep apnea, morbid obesity.Marland Kitchen  He was seen in the hospital with atrial flutter on 06/06/17.  He had rate controlled flutter which was thought to play a part in his heart failure exacerbation.  He had a TEE and cardioversion on 06/06/17.  He was started on amiodarone at that time.  Review of EKGs at the time showed multiple atrial flutter circuits.    Today, he denies symptoms of palpitations, chest pain, shortness of breath, orthopnea, PND, lower extremity edema, claudication, dizziness, presyncope, syncope, bleeding, or neurologic sequela. The patient is tolerating medications without difficulties.    Past Medical History:  Diagnosis Date  . AICD (automatic cardioverter/defibrillator) present   . Anemia of chronic disease   . Arthritis   . Atrial flutter (HCC)    DCCV 9/15  . Chest pain on exertion   . Chronic systolic congestive heart failure, NYHA class 2 (HCC)    EF 20-25% 1/16  . CKD (chronic kidney disease) stage 3, GFR 30-59 ml/min (HCC)   . Dyslipidemia   . Gout    Of big toe  . Hyperglycemia   . Hyperlipidemia   . Hypertension   . Morbid obesity with BMI of 45.0-49.9, adult (HCC)   . Non-ischemic cardiomyopathy (HCC)   . Organic erectile dysfunction   . OSA (obstructive sleep apnea)   . Pilonidal cyst   . Submandibular sialolithiasis    Right   Past Surgical History:  Procedure Laterality Date  .  CARDIAC CATHETERIZATION  2012  . CARDIOVERSION N/A 04/03/2014   Procedure: CARDIOVERSION;  Surgeon: Laurey Morale, MD;  Location: [facility] ENDOSCOPY;  Service: Cardiovascular;  Laterality: N/A;  . CARDIOVERSION N/A 03/09/2016   Procedure: CARDIOVERSION;  Surgeon: Laurey Morale, MD;  Location: [facility] ENDOSCOPY;  Service: Cardiovascular;  Laterality: N/A;  . CARDIOVERSION N/A 06/06/2017   Procedure: CARDIOVERSION;  Surgeon: Laurey Morale, MD;  Location: [facility] ENDOSCOPY;  Service: Cardiovascular;  Laterality: N/A;  . COLONOSCOPY    . EP IMPLANTABLE DEVICE N/A 05/21/2015   Procedure: ICD Implant;  Surgeon: Duke Salvia, MD;  Location: [facility] INVASIVE CV LAB;  Service: Cardiovascular;  Laterality: N/A;  . LEFT AND RIGHT HEART CATHETERIZATION WITH CORONARY ANGIOGRAM N/A 04/24/2014   Procedure: LEFT AND RIGHT HEART CATHETERIZATION WITH CORONARY ANGIOGRAM;  Surgeon: Laurey Morale, MD;  Location: [facility] CATH LAB;  Service: Cardiovascular;  Laterality: N/A;  . SALIVARY GLAND SURGERY  09/12/2012  . SUBMANDIBULAR GLAND EXCISION Right 09/12/2012   Procedure: Removal Right Submandibular Larina Bras;  Surgeon: Serena Colonel, MD;  Location: [facility] OR;  Service: ENT;  Laterality: Right;  . TEE WITHOUT CARDIOVERSION N/A 04/03/2014   Procedure: TRANSESOPHAGEAL ECHOCARDIOGRAM (TEE);  Surgeon: Laurey Morale, MD;  Location: [facility] ENDOSCOPY;  Service: Cardiovascular;  Laterality: N/A;  . TEE WITHOUT CARDIOVERSION N/A 03/09/2016   Procedure: TRANSESOPHAGEAL ECHOCARDIOGRAM (TEE);  Surgeon: Laurey Morale, MD;  Location: [facility] ENDOSCOPY;  Service: Cardiovascular;  Laterality: N/A;  . TEE WITHOUT CARDIOVERSION N/A 06/06/2017   Procedure: TRANSESOPHAGEAL ECHOCARDIOGRAM (TEE);  Surgeon: Laurey Morale, MD;  Location: [facility] ENDOSCOPY;  Service: Cardiovascular;  Laterality: N/A;     Current Outpatient Medications  Medication Sig Dispense Refill  . acetaminophen (TYLENOL) 500 MG tablet Take 2 tablets (1,000 mg total) by mouth every 8 (eight) hours as  needed. (Patient taking differently: Take 1,000 mg every 8 (eight) hours as needed by mouth (for pain or headaches). ) 90 tablet 2  . allopurinol (ZYLOPRIM) 100 MG tablet TAKE 1 TABLET BY MOUTH TWO TIMES DAILY (Patient taking differently: Take 100 mg by mouth two times a day) 60 tablet 2  . amiodarone (PACERONE) 200 MG tablet Take 1 tablet (200 mg total) by mouth daily. TAKE 2 TABLETS DAILY UNTIL 06/14/2017, and then resume 1 daily (Patient taking differently: Take 200 mg by mouth daily. ) 90 tablet 1  . apixaban (ELIQUIS) 5 MG TABS tablet Take 1 tablet (5 mg total) by mouth 2 (two) times daily. 180 tablet 1  . carvedilol (COREG) 6.25 MG tablet Take 1 tablet (6.25 mg total) 2 (two) times daily with a meal by mouth. 30 tablet 1  . colchicine 0.6 MG tablet Take 0.6 mg 2 (two) times daily as needed by mouth (FOR GOUT FLARES). Reported on 01/18/2016    . diclofenac sodium (VOLTAREN) 1 % GEL Apply 4 g topically 4 (four) times daily. (Patient taking differently: Apply 4 g 4 (four) times daily as needed topically (AS DIRECTED FOR PAIN). ) 1 Tube 2  . isosorbide-hydrALAZINE (BIDIL) 20-37.5 MG tablet Take 1 tablet 3 (three) times daily by mouth. 180 tablet 3  . metolazone (ZAROXOLYN) 2.5 MG tablet Take 1 Tablet Every Wednesday. 5 tablet 1  . potassium chloride SA (KLOR-CON M20) 20 MEQ tablet Take 2 tablets (40 mEq total) by mouth 3 (three) times daily. 180 tablet 6  . torsemide (DEMADEX) 20 MG tablet Take 5 tablets (100 mg total) by mouth 2 (two) times daily. 300 tablet 6   No current facility-administered medications for this visit.     Allergies:   Nsaids and Beta adrenergic blockers   Social History:  The patient  reports that  has never smoked. he has never used smokeless tobacco. He reports that he does not drink alcohol or use drugs.   Family History:  The patient's family history includes Cancer in his father; Colon cancer in his maternal aunt; Diabetes in his brother and sister; Hypertension in his  father and mother.    ROS:  Please see the history of present illness.   Otherwise, review of systems is positive for none.   All other systems are reviewed and negative.    PHYSICAL EXAM: VS:  BP 130/80   Pulse 92   Ht 5' 9.5" (1.765 m)   Wt (!) 320 lb (145.2 kg)   SpO2 96%   BMI 46.58 kg/m  , BMI Body mass index is 46.58 kg/m. GEN: Well nourished, well developed, in no acute distress  HEENT: normal  Neck: no JVD, carotid bruits, or masses Cardiac: RRR; no murmurs, rubs, or gallops,no edema  Respiratory:  clear to auscultation bilaterally, normal work of breathing GI: soft, nontender, nondistended, + BS MS: no deformity or atrophy  Skin: warm and dry Neuro:  Strength and sensation are intact Psych: euthymic mood, full affect  EKG:  EKG is not ordered today. Personal review of the ekg ordered 1/3/19shows sinus rhythm, IVCD, rate 93,  PVCs  Recent Labs: 12/29/2016: TSH 1.632 03/12/2017: ALT 24 06/18/2017: Hemoglobin 12.6; Platelets 194 06/27/2017: Magnesium 2.7 07/26/2017: B Natriuretic Peptide 399.7; BUN 43; Creatinine, Ser 2.91; Potassium 3.5; Sodium 140    Lipid Panel     Component Value Date/Time   CHOL 145 07/10/2013 1604   TRIG 150 (H) 07/10/2013 1604   HDL 31 (L) 07/10/2013 1604   CHOLHDL 4.7 07/10/2013 1604   VLDL 30 07/10/2013 1604   LDLCALC 84 07/10/2013 1604     Wt Readings from Last 3 Encounters:  07/31/17 (!) 320 lb (145.2 kg)  07/26/17 (!) 321 lb (145.6 kg)  06/27/17 (!) 317 lb 4.8 oz (143.9 kg)      Other studies Reviewed: Additional studies/ records that were reviewed today include: TTE 06/28/17  Review of the above records today demonstrates:  - Left ventricle: The cavity size was mildly dilated. Wall   thickness was normal. Systolic function was severely reduced. The   estimated ejection fraction was in the range of 25% to 30%.   Diffuse hypokinesis. No evidence of thrombus. - Aortic valve: There was no stenosis. There was trivial    regurgitation. - Aorta: Normal caliber aorta with minimal plaque. - Mitral valve: Mild to moderate central mitral regurgitation. - Left atrium: The atrium was moderately dilated. No evidence of   thrombus in the atrial cavity or appendage. No evidence of   thrombus in the atrial cavity or appendage. - Right ventricle: The cavity size was mildly dilated. Pacer wire   or catheter noted in right ventricle. Systolic function was   moderately reduced. - Right atrium: The atrium was moderately dilated. - Atrial septum: No defect or patent foramen ovale was identified.   ASSESSMENT AND PLAN:  1.  Atrial flutter/atrial tachycardia: It appears that he has had 2 different arrhythmias.  Unfortunately, he does have severely dilated left atrium and moderately dilated right atrium.  He also has sleep apnea that has been untreated.  Would plan to continue his amiodarone at this time.  He does need sleep apnea therapy and this may help to improve his atrial arrhythmia burden.  Due to his multiple arrhythmias, would not plan for ablation at this time.  2.  Chronic systolic heart failure due to nonischemic cardiomyopathy: Has an ICD in place that is functioning appropriately.  Currently on optimal medical therapy without signs of volume overload.  This patients CHA2DS2-VASc Score and unadjusted Ischemic Stroke Rate (% per year) is equal to 2.2 % stroke rate/year from a score of 2  Above score calculated as 1 point each if present [CHF, HTN, DM, Vascular=MI/PAD/Aortic Plaque, Age if 65-74, or Male] Above score calculated as 2 points each if present [Age > 75, or Stroke/TIA/TE]  3.  Hypertension: Currently well controlled  4.  Obstructive sleep apnea: Does not tolerate CPAP.  This makes treating atrial arrhythmias more difficult.  Encouraged compliance  5.  Morbid obesity: Encouraged weight loss      Current medicines are reviewed at length with the patient today.   The patient does not have  concerns regarding his medicines.  The following changes were made today:  none  Labs/ tests ordered today include:  No orders of the defined types were placed in this encounter.    Disposition:   FU with Graciela Husbands 3 months  Signed, Anju Sereno Jorja Loa, MD  07/31/2017 9:32 AM     [facility] [address] [place] [address] [phone] (office) [phone] (fax)

## 2017-08-01 ENCOUNTER — Ambulatory Visit (INDEPENDENT_AMBULATORY_CARE_PROVIDER_SITE_OTHER): Payer: Medicaid Other | Admitting: Internal Medicine

## 2017-08-01 ENCOUNTER — Other Ambulatory Visit: Payer: Self-pay

## 2017-08-01 ENCOUNTER — Encounter: Payer: Self-pay | Admitting: Internal Medicine

## 2017-08-01 VITALS — BP 119/81 | HR 85 | Temp 98.6°F | Ht 69.5 in | Wt 327.2 lb

## 2017-08-01 DIAGNOSIS — I5022 Chronic systolic (congestive) heart failure: Secondary | ICD-10-CM | POA: Diagnosis not present

## 2017-08-01 DIAGNOSIS — Z79899 Other long term (current) drug therapy: Secondary | ICD-10-CM

## 2017-08-01 DIAGNOSIS — F4312 Post-traumatic stress disorder, chronic: Secondary | ICD-10-CM | POA: Diagnosis not present

## 2017-08-01 DIAGNOSIS — Z95810 Presence of automatic (implantable) cardiac defibrillator: Secondary | ICD-10-CM

## 2017-08-01 DIAGNOSIS — I130 Hypertensive heart and chronic kidney disease with heart failure and stage 1 through stage 4 chronic kidney disease, or unspecified chronic kidney disease: Secondary | ICD-10-CM | POA: Diagnosis present

## 2017-08-01 DIAGNOSIS — I252 Old myocardial infarction: Secondary | ICD-10-CM

## 2017-08-01 DIAGNOSIS — Z7901 Long term (current) use of anticoagulants: Secondary | ICD-10-CM

## 2017-08-01 DIAGNOSIS — G4733 Obstructive sleep apnea (adult) (pediatric): Secondary | ICD-10-CM | POA: Diagnosis not present

## 2017-08-01 DIAGNOSIS — E669 Obesity, unspecified: Secondary | ICD-10-CM | POA: Diagnosis not present

## 2017-08-01 DIAGNOSIS — M109 Gout, unspecified: Secondary | ICD-10-CM

## 2017-08-01 DIAGNOSIS — N184 Chronic kidney disease, stage 4 (severe): Secondary | ICD-10-CM | POA: Diagnosis not present

## 2017-08-01 DIAGNOSIS — I4892 Unspecified atrial flutter: Secondary | ICD-10-CM | POA: Diagnosis not present

## 2017-08-01 DIAGNOSIS — M1990 Unspecified osteoarthritis, unspecified site: Secondary | ICD-10-CM

## 2017-08-01 DIAGNOSIS — L918 Other hypertrophic disorders of the skin: Secondary | ICD-10-CM | POA: Diagnosis not present

## 2017-08-01 DIAGNOSIS — I484 Atypical atrial flutter: Secondary | ICD-10-CM

## 2017-08-01 DIAGNOSIS — Z6841 Body Mass Index (BMI) 40.0 and over, adult: Secondary | ICD-10-CM | POA: Diagnosis not present

## 2017-08-01 NOTE — Patient Instructions (Signed)
It was a pleasure to see you again Eric Murillo.  Please continue your medications as prescribed.  Try to check your weights daily at home to keep better track of your day to day weights as our scales may be different than others.  Please follow up with [place] again in about 3 months or sooner if needed.
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
## 2017-08-02 NOTE — Assessment & Plan Note (Signed)
He has chronic HFpEF with Atrial Flutter/Atrial tachycardia and OSA. He is following with heart failure and had his Torsemide increased to 100 mg BID to which he reports adherence. He was to increase his K supplement to 40 TID but has only been taking BID. He reports adherence to his Coreg, Bidil, and Metolazone. He denies any new dyspnea or increased LE edema. He has chronic unchanged orthopnea. His weight this visit is 327 lbs compared to 320 lbs the day prior. He had prior SPEP and fat pad biopsy which were negative for amyloidosis. He has not had a cardiac MRI (cannot fit in machine) or PET scan. A/P: His weight difference is likely due to different scales as he has not experienced change in symptoms. His HFpEF is currently stable. I advised him to check his weights daily at home for more consistent measurements and increase his potassium to 40 mEq TID. He will continue his other medications as prescribed. - Continue Torsemide 100 mg BID - Potassium 40 mEq TID - Metolazone 2.5 mg every Wednesday - Coreg 6.25 mg BID - Bidil 20-37.5 mg TID - Daily weights at home

## 2017-08-02 NOTE — Assessment & Plan Note (Signed)
He is currently taking Amiodarone 200 mg daily, Coreg 6.25 mg BID, and Eliquis 5 mg BID. He denies any palpitations or obvious bleeding. He is not considered a good candidate for ablation by EP. A/P: In sinus rhythm this visit. - Continue Eliquis, Amiodarone, Coreg - f/u with Cardiology as scheduled
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
## 2017-08-03 ENCOUNTER — Encounter (HOSPITAL_COMMUNITY): Payer: Self-pay

## 2017-08-03 ENCOUNTER — Ambulatory Visit (HOSPITAL_COMMUNITY)
Admission: RE | Admit: 2017-08-03 | Discharge: 2017-08-03 | Disposition: A | Discharge status: Home / Self Care | Payer: Medicaid Other | Source: Ambulatory Visit | Attending: Cardiology | Admitting: Cardiology

## 2017-08-03 VITALS — BP 142/98 | HR 84 | Wt 327.4 lb

## 2017-08-03 DIAGNOSIS — I429 Cardiomyopathy, unspecified: Secondary | ICD-10-CM | POA: Insufficient documentation

## 2017-08-03 DIAGNOSIS — Z9111 Patient's noncompliance with dietary regimen: Secondary | ICD-10-CM | POA: Insufficient documentation

## 2017-08-03 DIAGNOSIS — I130 Hypertensive heart and chronic kidney disease with heart failure and stage 1 through stage 4 chronic kidney disease, or unspecified chronic kidney disease: Secondary | ICD-10-CM | POA: Diagnosis present

## 2017-08-03 DIAGNOSIS — Z79899 Other long term (current) drug therapy: Secondary | ICD-10-CM | POA: Diagnosis not present

## 2017-08-03 DIAGNOSIS — Z9889 Other specified postprocedural states: Secondary | ICD-10-CM | POA: Insufficient documentation

## 2017-08-03 DIAGNOSIS — I480 Paroxysmal atrial fibrillation: Secondary | ICD-10-CM | POA: Diagnosis not present

## 2017-08-03 DIAGNOSIS — M109 Gout, unspecified: Secondary | ICD-10-CM | POA: Diagnosis not present

## 2017-08-03 DIAGNOSIS — Z6841 Body Mass Index (BMI) 40.0 and over, adult: Secondary | ICD-10-CM | POA: Insufficient documentation

## 2017-08-03 DIAGNOSIS — N529 Male erectile dysfunction, unspecified: Secondary | ICD-10-CM | POA: Insufficient documentation

## 2017-08-03 DIAGNOSIS — I5022 Chronic systolic (congestive) heart failure: Secondary | ICD-10-CM | POA: Diagnosis present

## 2017-08-03 DIAGNOSIS — I491 Atrial premature depolarization: Secondary | ICD-10-CM | POA: Insufficient documentation

## 2017-08-03 DIAGNOSIS — Z8249 Family history of ischemic heart disease and other diseases of the circulatory system: Secondary | ICD-10-CM | POA: Diagnosis not present

## 2017-08-03 DIAGNOSIS — Z7901 Long term (current) use of anticoagulants: Secondary | ICD-10-CM | POA: Diagnosis not present

## 2017-08-03 DIAGNOSIS — G4733 Obstructive sleep apnea (adult) (pediatric): Secondary | ICD-10-CM

## 2017-08-03 DIAGNOSIS — E785 Hyperlipidemia, unspecified: Secondary | ICD-10-CM | POA: Insufficient documentation

## 2017-08-03 DIAGNOSIS — N184 Chronic kidney disease, stage 4 (severe): Secondary | ICD-10-CM | POA: Diagnosis not present

## 2017-08-03 DIAGNOSIS — I484 Atypical atrial flutter: Secondary | ICD-10-CM | POA: Diagnosis not present

## 2017-08-03 DIAGNOSIS — I454 Nonspecific intraventricular block: Secondary | ICD-10-CM | POA: Diagnosis not present

## 2017-08-03 DIAGNOSIS — I440 Atrioventricular block, first degree: Secondary | ICD-10-CM | POA: Diagnosis not present

## 2017-08-03 LAB — BASIC METABOLIC PANEL
Anion gap: 8 (ref 5–15)
BUN: 47 mg/dL — ABNORMAL HIGH (ref 6–20)
CALCIUM: 9.1 mg/dL (ref 8.9–10.3)
CO2: 29 mmol/L (ref 22–32)
Chloride: 106 mmol/L (ref 101–111)
Creatinine, Ser: 3.35 mg/dL — ABNORMAL HIGH (ref 0.61–1.24)
GFR calc Af Amer: 22 mL/min — ABNORMAL LOW (ref >60)
GFR, EST NON AFRICAN AMERICAN: 19 mL/min — AB (ref >60)
GLUCOSE: 120 mg/dL — AB (ref 65–99)
POTASSIUM: 4 mmol/L (ref 3.5–5.1)
SODIUM: 143 mmol/L (ref 135–145)

## 2017-08-03 MED ORDER — METOLAZONE 2.5 MG PO TABS: 2.5000 mg | ORAL_TABLET | ORAL | 3 refills | Status: DC

## 2017-08-03 NOTE — Patient Instructions (Signed)
Take Metolazone Today, Tomorrow and Sunday, then resume taking it every Wednesday  Labs today  Labs again on Monday  Your physician recommends that you schedule a follow-up appointment in: 2 weeks

## 2017-08-03 NOTE — Addendum Note (Signed)
Addended by: Earl Lagos on: 08/03/2017 12:01 PM   Modules accepted: Level of Service

## 2017-08-03 NOTE — Progress Notes (Signed)
Internal Medicine Clinic Attending  Case discussed with Dr. Patel at the time of the visit.  We reviewed the resident's history and exam and pertinent patient test results.  I agree with the assessment, diagnosis, and plan of care documented in the resident's note.  
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
## 2017-08-07 ENCOUNTER — Ambulatory Visit (HOSPITAL_COMMUNITY)
Admission: RE | Admit: 2017-08-07 | Discharge: 2017-08-07 | Disposition: A | Discharge status: Home / Self Care | Payer: Medicaid Other | Source: Ambulatory Visit | Attending: Cardiology | Admitting: Cardiology

## 2017-08-07 DIAGNOSIS — I5022 Chronic systolic (congestive) heart failure: Secondary | ICD-10-CM | POA: Diagnosis not present

## 2017-08-07 LAB — BASIC METABOLIC PANEL
Anion gap: 13 (ref 5–15)
BUN: 44 mg/dL — AB (ref 6–20)
CALCIUM: 9 mg/dL (ref 8.9–10.3)
CO2: 32 mmol/L (ref 22–32)
CREATININE: 3.37 mg/dL — AB (ref 0.61–1.24)
Chloride: 95 mmol/L — ABNORMAL LOW (ref 101–111)
GFR, EST AFRICAN AMERICAN: 22 mL/min — AB (ref >60)
GFR, EST NON AFRICAN AMERICAN: 19 mL/min — AB (ref >60)
Glucose, Bld: 111 mg/dL — ABNORMAL HIGH (ref 65–99)
Potassium: 3.2 mmol/L — ABNORMAL LOW (ref 3.5–5.1)
Sodium: 140 mmol/L (ref 135–145)

## 2017-08-17 ENCOUNTER — Ambulatory Visit (HOSPITAL_COMMUNITY)
Admission: RE | Admit: 2017-08-17 | Discharge: 2017-08-17 | Disposition: A | Discharge status: Home / Self Care | Payer: Medicaid Other | Source: Ambulatory Visit | Attending: Cardiology | Admitting: Cardiology

## 2017-08-17 ENCOUNTER — Encounter (HOSPITAL_COMMUNITY): Payer: Self-pay

## 2017-08-17 VITALS — BP 120/72 | HR 81 | Wt 317.0 lb

## 2017-08-17 DIAGNOSIS — E785 Hyperlipidemia, unspecified: Secondary | ICD-10-CM | POA: Insufficient documentation

## 2017-08-17 DIAGNOSIS — N184 Chronic kidney disease, stage 4 (severe): Secondary | ICD-10-CM | POA: Insufficient documentation

## 2017-08-17 DIAGNOSIS — M109 Gout, unspecified: Secondary | ICD-10-CM | POA: Insufficient documentation

## 2017-08-17 DIAGNOSIS — I428 Other cardiomyopathies: Secondary | ICD-10-CM | POA: Diagnosis not present

## 2017-08-17 DIAGNOSIS — Z7901 Long term (current) use of anticoagulants: Secondary | ICD-10-CM | POA: Diagnosis not present

## 2017-08-17 DIAGNOSIS — Z79899 Other long term (current) drug therapy: Secondary | ICD-10-CM | POA: Diagnosis not present

## 2017-08-17 DIAGNOSIS — G4733 Obstructive sleep apnea (adult) (pediatric): Secondary | ICD-10-CM | POA: Diagnosis not present

## 2017-08-17 DIAGNOSIS — Z6841 Body Mass Index (BMI) 40.0 and over, adult: Secondary | ICD-10-CM | POA: Diagnosis not present

## 2017-08-17 DIAGNOSIS — Z0000 Encounter for general adult medical examination without abnormal findings: Secondary | ICD-10-CM | POA: Diagnosis not present

## 2017-08-17 DIAGNOSIS — I480 Paroxysmal atrial fibrillation: Secondary | ICD-10-CM | POA: Insufficient documentation

## 2017-08-17 DIAGNOSIS — I5022 Chronic systolic (congestive) heart failure: Secondary | ICD-10-CM | POA: Diagnosis not present

## 2017-08-17 DIAGNOSIS — I484 Atypical atrial flutter: Secondary | ICD-10-CM | POA: Insufficient documentation

## 2017-08-17 DIAGNOSIS — I130 Hypertensive heart and chronic kidney disease with heart failure and stage 1 through stage 4 chronic kidney disease, or unspecified chronic kidney disease: Secondary | ICD-10-CM | POA: Insufficient documentation

## 2017-08-17 DIAGNOSIS — Z8249 Family history of ischemic heart disease and other diseases of the circulatory system: Secondary | ICD-10-CM | POA: Diagnosis not present

## 2017-08-17 LAB — BASIC METABOLIC PANEL
Anion gap: 15 (ref 5–15)
BUN: 71 mg/dL — ABNORMAL HIGH (ref 6–20)
CHLORIDE: 97 mmol/L — AB (ref 101–111)
CO2: 29 mmol/L (ref 22–32)
Calcium: 9.2 mg/dL (ref 8.9–10.3)
Creatinine, Ser: 3.76 mg/dL — ABNORMAL HIGH (ref 0.61–1.24)
GFR calc Af Amer: 19 mL/min — ABNORMAL LOW (ref >60)
GFR calc non Af Amer: 17 mL/min — ABNORMAL LOW (ref >60)
Glucose, Bld: 173 mg/dL — ABNORMAL HIGH (ref 65–99)
POTASSIUM: 3.3 mmol/L — AB (ref 3.5–5.1)
SODIUM: 141 mmol/L (ref 135–145)

## 2017-08-17 NOTE — Patient Instructions (Signed)
Routine lab work today. Will notify you of abnormal results, otherwise no news is good news!  No changes to medication at this time.  Follow up 8 weeks with Otilio Saber PA-C.  _________________________________________________________________ [place] Code: 9002  Take all medication as prescribed the day of your appointment. Bring all medications with you to your appointment.  Do the following things EVERYDAY: 1) Weigh yourself in the morning before breakfast. Write it down and keep it in a log. 2) Take your medicines as prescribed 3) Eat low salt foods-Limit salt (sodium) to 2000 mg per day.  4) Stay as active as you can everyday 5) Limit all fluids for the day to less than 2 liters

## 2017-08-17 NOTE — Progress Notes (Signed)
Patient ID: Eric Murillo, male   DOB: [birth year], 57 y.o.   MRN: [id number]    Advanced Heart Failure Clinic Note  PCP: Dr. Allena Katz Cardiology: Dr Donia Ast Veasey is a 57 y.o. male with history of morbid obesity, hyperlipidemia, HTN, atrial flutter (s/p TEE DC-CV 04/03/14 and again in 8/17), NICM with chronic systolic HF and severe OSA but unable to tolerate CPAP.    He was admitted in 04/2014 with increased exertional dyspnea and chest tightness.  RHC/LHC showed elevated filling pressures, preserved cardiac output, and no significant coronary disease.  He was diuresed and discharged home. TEE (9/15) with EF 25-30% with global hypokinesis, mild to moderate MR, mildly decreased RV systolic function.   He underwent work-up for cardiac amyloid. SPEP negative. Fat pad biopsy 6/16 with benign adipose tissue. Unable to get cMRI due to size.  Echo in 8/17 showed EF 20-25%, diffuse hypokinesis, PASP 48 mmHg.  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    He was admitted in 8/17 with acute on chronic systolic CHF in setting of atypical atrial flutter.  He was diuresed and had TEE-guided DCCV to NSR.  Amiodarone was begun.   Admitted 11/13 -> 11/19 with atypical Aflutter and underwent TEE/DCCV on 06/06/17. Remains in NSR up to day of discharge. Discharge weight 309 lbs.  He presents today for regular follow up. Given 3 doses of metolazone at last visit. Weight down 10 lbs and maintaining.  His functional status has improved.  He gets around the house and is no longer SOB with ADLs.  He sees Dr. Hyman Hopes who wants to continue watchful waiting with his kidney function. No plans for dialysis. He is taking all of his medications as tolerated.  Taking metolazone every Wednesday.   Corvue: Thoracic impedence above threshold since extra metolazone. No VT/VF  Echo 06/05/2017: EF 20-25%, diffuse hypokinesis, moderately dilated LV, mildly decreased RV systolic function, PASP 66 mmHg.   Labs (08/21/2014) K 3.9 Creatinine  1.78 Labs (08/04/2015) : K 4.2 Creatinine 1.66 BNP 173 Labs (6/17): K 4.3, creatinine 1.87 Labs (8/17): K 3.9, creatinine 2.27, HCT 41.2 Labs (1/18): K 3.9, creatinine 2.81 Labs (06/27/2017): K 3.3 Creatinine 2.97   ECG (2/17): NSR, 1st degree AV block, LAFB, poor RWP, low voltage.   Past Medical History: 1. Nonischemic cardiomyopathy: Echo (3/12) with EF 30-35% and mildly dilated LV, diffuse LV hypokinesis, moderate MR, PA systolic pressure 55 mmHg.  Left and right heart cath (3/12): No angiographic CAD; mean RA 20, PA 76/41, mean PCWP 38, CI 2.1.  ANA, SPEP, and HIV negative.  TSH normal.  Denies drug abuse, heavy ETOH intake.  No family history of cardiomyopathy.  Cardiomyopathy may be due to long-standing HTN.  Echo (6/13): EF 35-40%, moderate LV dilation, moderate to severe LAE, PA systolic pressure 52 mmHg.  Unable to fit in magnet for cardiac MRI.  Echo (8/14) with EF 45%, moderately dilated LV, mild LVH, normal RV, PA systolic pressure 49 mmHg. TEE (9/15) with EF 25-30% with global hypokinesis, mild to moderate MR, mildly decreased RV systolic function. LHC/RHC (10/15) with no significant coronary disease; mean RA 14, PA 65/25 mean 42, unable to obtain PCWP but LVEDP 24, CI 2.69.  Echo (6/16) with EF 30-35%, PA systolic pressure 62 mmHg, RV normal size and systolic function. St Jude ICD.  - Echo (8/17): EF 20-25%, mild LV dilation, diffuse hypokinesis, PASP 48 mmHg.  2. ERECTILE DYSFUNCTION 3. HYPERTENSION  4. CKD: Stage III 5. DYSLIPIDEMIA  6. Obesity 7.  Hyperlipidemia 8. Gout 9. OSA: Severe on 6/13 sleep study. Unable to use CPAP.  10. Sialolithiasis 11. Atypical Atrial Flutter: TEE-DC-CV (04/03/14) which was successful; TEE-DCCV in 8/17 was successful.  Now on amiodarone.    Family History: No history of cardiomyopathy No history of cancer among first degree relatives father - died of MI (5s)  mother - HTN  Social History: Lives girlfriend.  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 tobacco - none alcohol - none drugs - none  Review of systems complete and found to be negative unless listed in HPI.    Current Outpatient Medications  Medication Sig Dispense Refill  . acetaminophen (TYLENOL) 500 MG tablet Take 2 tablets (1,000 mg total) by mouth every 8 (eight) hours as needed. (Patient taking differently: Take 1,000 mg every 8 (eight) hours as needed by mouth (for pain or headaches). ) 90 tablet 2  . allopurinol (ZYLOPRIM) 100 MG tablet TAKE 1 TABLET BY MOUTH TWO TIMES DAILY (Patient taking differently: Take 100 mg by mouth two times a day) 60 tablet 2  . amiodarone (PACERONE) 200 MG tablet Take 1 tablet (200 mg total) by mouth daily. TAKE 2 TABLETS DAILY UNTIL 06/14/2017, and then resume 1 daily (Patient taking differently: Take 200 mg by mouth daily. ) 90 tablet 1  . apixaban (ELIQUIS) 5 MG TABS tablet Take 1 tablet (5 mg total) by mouth 2 (two) times daily. 180 tablet 1  . carvedilol (COREG) 6.25 MG tablet Take 1 tablet (6.25 mg total) 2 (two) times daily with a meal by mouth. 30 tablet 1  . isosorbide-hydrALAZINE (BIDIL) 20-37.5 MG tablet Take 1 tablet 3 (three) times daily by mouth. 180 tablet 3  . metolazone (ZAROXOLYN) 2.5 MG tablet Take 1 tablet (2.5 mg total) by mouth once a week. Every Wednesday, take extra as directed 10 tablet 3  . potassium chloride SA (KLOR-CON M20) 20 MEQ tablet Take 2 tablets (40 mEq total) by mouth 3 (three) times daily. 180 tablet 6  . torsemide (DEMADEX) 20 MG tablet Take 5 tablets (100 mg total) by mouth 2 (two) times daily. 300 tablet 6  . colchicine 0.6 MG tablet Take 0.6 mg 2 (two) times daily as needed by mouth (FOR GOUT FLARES). Reported on 01/18/2016    . diclofenac sodium (VOLTAREN) 1 % GEL Apply 4 g topically 4 (four) times daily. (Patient not taking: Reported on 08/17/2017) 1 Tube 2   No current facility-administered medications for this encounter.    Vitals:   08/17/17 1011  BP: 120/72  Pulse: 81  SpO2: 96%    Weight: (!) 317 lb (143.8 kg)   Wt Readings from Last 3 Encounters:  08/17/17 (!) 317 lb (143.8 kg)  08/03/17 (!) 327 lb 6.4 oz (148.5 kg)  08/01/17 (!) 327 lb 3.2 oz (148.4 kg)   Physical Exam General: Obese, NAD.  HEENT: Normal Neck: Supple. JVP ~8 cm Carotids 2+ bilat; no bruits. No thyromegaly or nodule noted. Cor: PMI nondisplaced. RRR, No M/G/R noted Lungs: CTAB, normal effort. Abdomen: Obese, soft, non-tender, non-distended, no HSM. No bruits or masses. +BS  Extremities: No cyanosis, clubbing, or rash. Chronic venous stasis. 1+ chronic edema.  Neuro: Alert & orientedx3, cranial nerves grossly intact. moves all 4 extremities w/o difficulty. Affect pleasant   Assessment/Plan:  1. Chronic systolic heart failure: Nonischemic cardiomyopathy, likely related to HTN.  Echo in 8/14 with EF 45% but EF down to 25-30% on TEE while in atrial flutter (03/2014).  Echo (1/16) with EF ~  25% and echo in 6/16 with EF 30-35%.  No cardiac MRI done with elevated creatinine and size.  There was concern for cardiac amyloidosis.  However, negative SPEP and abdominal fat pad biopsy negative. Low voltage on ECG may be due to obesity and not amyloidosis. Most recent Echo in 12/2016 with EF 25-30%.  - NYHA II-III symptoms now with volume off - Volume status much improved.  - Continue torsemide 100 mg BID.  - Continue metolazone 2.5 mg q Wednesday. Can take extra only as needed.  - Continue carvedilol 6.25 mg BID with with 1st degree block on EKG - No spiro/dig/arb with CKD.  - Continue Bidil 1 tabs TID.  - Reinforced fluid restriction to < 2 L daily, sodium restriction to less than 2000 mg daily, and the importance of daily weights.  2. PAF / AFL - s/p DCCV/TEE 06/06/2017. - Regular on exam. - Continue Eliquis 5 mg BID. No bleeding.  - Has been seen by EP. Not felt to be amenable to ablation.   3. Low voltage on ECG:  - SPEP and fat pad biopsy negative. No change.   4. CKD IV - BMET today.  - Sees  Dr. Hyman Hopes.   5. HTN:   - Stable on medications today.   6. OSA: - Working on getting nasal mask for CPAP.  - Working with Pulmonary to get a note that works with his profound orthopnea.   7. Morbid Obesity:  - Body mass index is 46.14 kg/m. - Needs to continue to try and lose weight.   Doing well after several doses of metolazone. Stable. Labs today. RTC 6-8 weeks. Sooner with symptoms.   Graciella Freer, PA-C  08/17/2017

## 2017-08-20 ENCOUNTER — Telehealth (HOSPITAL_COMMUNITY): Payer: Self-pay

## 2017-08-20 DIAGNOSIS — I5022 Chronic systolic (congestive) heart failure: Secondary | ICD-10-CM

## 2017-08-20 NOTE — Telephone Encounter (Signed)
Notes recorded by Teresa Coombs, RN on 08/20/2017 at 10:19 AM EST Pt aware of results, agreeable to med changes, and Lab appointment made (orders placed)   ------  Notes recorded by Theresia Bough, CMA on 08/17/2017 at 4:18 PM EST Left message for patient to call back.  [phone] (H) ------  Notes recorded by Theresia Bough, CMA on 08/17/2017 at 4:13 PM EST Unable to reach patient. No answer, unable to leave message  [phone] (M) ------  Notes recorded by Graciella Freer, PA-C on 08/17/2017 at 11:32 AM EST Creatinine remains elevated, but renal following.  Have him hold torsemide x ONE dose. Needs recheck 7-10 days.    Have him take extra 40 meq K x 1  Baldwin Crown" [place], [place] 08/17/2017 11:32 AM

## 2017-08-23 ENCOUNTER — Other Ambulatory Visit: Payer: Self-pay | Admitting: Internal Medicine

## 2017-08-23 NOTE — Telephone Encounter (Signed)
Next appt scheduled  10/24/17 with PCP.

## 2017-08-27 ENCOUNTER — Other Ambulatory Visit: Payer: Self-pay

## 2017-08-30 ENCOUNTER — Other Ambulatory Visit: Payer: Self-pay | Admitting: Internal Medicine

## 2017-08-30 ENCOUNTER — Ambulatory Visit (HOSPITAL_COMMUNITY)
Admission: RE | Admit: 2017-08-30 | Discharge: 2017-08-30 | Disposition: A | Discharge status: Home / Self Care | Payer: Medicaid Other | Source: Ambulatory Visit | Attending: Internal Medicine | Admitting: Internal Medicine

## 2017-08-30 VITALS — Wt 326.4 lb

## 2017-08-30 DIAGNOSIS — I5022 Chronic systolic (congestive) heart failure: Secondary | ICD-10-CM

## 2017-08-30 LAB — BASIC METABOLIC PANEL
Anion gap: 14 (ref 5–15)
BUN: 62 mg/dL — ABNORMAL HIGH (ref 6–20)
CALCIUM: 8.9 mg/dL (ref 8.9–10.3)
CO2: 30 mmol/L (ref 22–32)
CREATININE: 3.48 mg/dL — AB (ref 0.61–1.24)
Chloride: 99 mmol/L — ABNORMAL LOW (ref 101–111)
GFR calc non Af Amer: 18 mL/min — ABNORMAL LOW (ref >60)
GFR, EST AFRICAN AMERICAN: 21 mL/min — AB (ref >60)
GLUCOSE: 104 mg/dL — AB (ref 65–99)
Potassium: 3.6 mmol/L (ref 3.5–5.1)
Sodium: 143 mmol/L (ref 135–145)

## 2017-08-30 NOTE — Progress Notes (Signed)
At request of the patient b/p check was done. Patient reports he has been having episodes of mild dizziness at times.  Denied-c/p edema or increased SOB  Above reported to Joanell Rising Patient should continue current medications as ordered. Advised to be sure to take medications with food. Keep follow up as scheduled   Patient aware and voiced understanding. Follow up 10/11/17 @ 3.

## 2017-08-31 ENCOUNTER — Other Ambulatory Visit: Payer: Self-pay

## 2017-09-03 ENCOUNTER — Ambulatory Visit (INDEPENDENT_AMBULATORY_CARE_PROVIDER_SITE_OTHER): Payer: Medicaid Other

## 2017-09-03 DIAGNOSIS — Z95810 Presence of automatic (implantable) cardiac defibrillator: Secondary | ICD-10-CM

## 2017-09-03 DIAGNOSIS — I5022 Chronic systolic (congestive) heart failure: Secondary | ICD-10-CM | POA: Diagnosis not present

## 2017-09-04 NOTE — Progress Notes (Signed)
EPIC Encounter for ICM Monitoring  Patient Name: Islam Eichinger is a 57 y.o. male Date: 09/04/2017 Primary Care Physican: Zada Finders, MD Primary Cardiologist:[place] Electrophysiologist: Faustino Congress Weight:325 lbs       Heart Failure questions reviewed, pt has slight swelling in legs.   Thoracic impedance normal but was abnormal suggesting fluid accumulation from 08/23/2017 to 09/01/2017.  Prescribed dosage: Torsemide20 mg take 5 tablets (100 mgtotal) twice a day. Take Metolazone 2.5 mg 1 tablet every Wednesday as prescribed.    Potassium20 mEq take 2 tablets (40 mEqtotal) three times a day.  Labs: 06/27/2017 Creatinine 2.97, BUN 50, Potassium 3.3, Sodium 142, EGFR 22-26 06/18/2017 Creatinine3.28, BUN64, Potassium3.2, Sodium141, [id number] 06/11/2017 Creatinine2.92, BUN50, Potassium3.5, Sodium140, [id number]  06/10/2017 Creatinine2.75, BUN46, Potassium3.6, Sodium140, [id number]  06/09/2017 Creatinine2.88, BUN50, Potassium3.2, Sodium141, [id number]  06/08/2017 Creatinine2.96, BUN56, Potassium3.1, Sodium142, [id number]  06/07/2017 Creatinine3.06, BUN64, Potassium3.6, [id number], [id number]  06/06/2017 Creatinine3.33, BUN63, Potassium4.0, [id number], [id number]  06/05/2017 Creatinine3.57, BUN62, Potassium3.7, Sodium140, [id number]  06/04/2017 Creatinine4.02, BUN63, Potassium4.1, Sodium140, [id number] 04/10/2017 Creatinine 2.71, BUN 30, Potassium 3.5, Sodium 140, EGFR 25-29 04/03/2017 Creatinine 2.41, BUN 27, Potassium 3.6, Sodium 142, EGFR 28-33 03/12/2017 Creatinine 3.66, BUN 43, Potassium 3.7, Sodium 138, EGFR 17-20 02/07/2017 Creatinine 2.80, BUN 33, Potassium 4.0, Sodium 143, EGFR 24-28 01/12/2017 Creatinine 2.92, BUN 43, Potassium 3.5, Sodium 140, EGFR 23-26  Recommendations: No changes.   Encouraged to call for fluid symptoms.  Follow-up plan: ICM clinic phone appointment on 10/04/2017.  Office appointment scheduled 10/11/2017 with HF  Clinic NP/PA and 10/28/2017 with Dr Caryl Comes.  Copy of ICM check sent to Dr. Caryl Comes.   3 month ICM trend: 09/03/2017    1 Year ICM trend:       Rosalene Billings, RN 09/04/2017 11:25 AM

## 2017-09-18 ENCOUNTER — Other Ambulatory Visit: Payer: Self-pay | Admitting: Internal Medicine

## 2017-10-04 ENCOUNTER — Ambulatory Visit (INDEPENDENT_AMBULATORY_CARE_PROVIDER_SITE_OTHER): Payer: Medicaid Other | Admitting: *Deleted

## 2017-10-04 ENCOUNTER — Telehealth: Payer: Self-pay | Admitting: Cardiology

## 2017-10-04 DIAGNOSIS — I428 Other cardiomyopathies: Secondary | ICD-10-CM | POA: Diagnosis not present

## 2017-10-04 DIAGNOSIS — I5022 Chronic systolic (congestive) heart failure: Secondary | ICD-10-CM | POA: Diagnosis not present

## 2017-10-04 DIAGNOSIS — Z95810 Presence of automatic (implantable) cardiac defibrillator: Secondary | ICD-10-CM | POA: Diagnosis not present

## 2017-10-04 NOTE — Telephone Encounter (Signed)
Spoke with pt and reminded pt of remote transmission that is due today. Pt verbalized understanding.   

## 2017-10-05 ENCOUNTER — Encounter: Payer: Self-pay | Admitting: Cardiology

## 2017-10-05 NOTE — Progress Notes (Signed)
EPIC Encounter for ICM Monitoring  Patient Name: Eric Murillo is a 57 y.o. male Date: 10/05/2017 Primary Care Physican: Zada Finders, MD Primary Cardiologist:[place] Electrophysiologist: Caryl Comes Dry Weight:325lbs       Heart Failure questions reviewed, pt asymptomatic.  He thinks his fluid intake is high and reminded him to limit all fluids to 64 oz daily   Thoracic impedance close to normal.  Prescribed dosage: Torsemide20 mg take 5 tablets (100 mgtotal) twice a day. Take Metolazone 2.5 mg 1 tablet every Wednesday as prescribed.Potassium20 mEq take 2 tablets (40 mEqtotal) three times a day.  Labs: 06/27/2017 Creatinine 2.97, BUN 50, Potassium 3.3, Sodium 142, EGFR 22-26 06/18/2017 Creatinine3.28, BUN64, Potassium3.2, Sodium141, EGFR20-23 06/11/2017 Creatinine2.92, BUN50, Potassium3.5, Sodium140, [id number]  06/10/2017 Creatinine2.75, BUN46, Potassium3.6, Sodium140, [id number]  06/09/2017 Creatinine2.88, BUN50, Potassium3.2, Sodium141, [id number]  06/08/2017 Creatinine2.96, BUN56, Potassium3.1, Sodium142, [id number]  06/07/2017 Creatinine3.06, BUN64, Potassium3.6, [id number], [id number]  06/06/2017 Creatinine3.33, BUN63, Potassium4.0, SVEXOG002, [id number]  06/05/2017 Creatinine3.57, BUN62, Potassium3.7, Sodium140, [id number]  06/04/2017 Creatinine4.02, BUN63, Potassium4.1, Sodium140, [id number] 04/10/2017 Creatinine 2.71, BUN 30, Potassium 3.5, Sodium 140, EGFR 25-29 04/03/2017 Creatinine 2.41, BUN 27, Potassium 3.6, Sodium 142, EGFR 28-33 03/12/2017 Creatinine 3.66, BUN 43, Potassium 3.7, Sodium 138, EGFR 17-20 02/07/2017 Creatinine 2.80, BUN 33, Potassium 4.0, Sodium 143, EGFR 24-28 01/12/2017 Creatinine 2.92, BUN 43, Potassium 3.5, Sodium 140, EGFR 23-26  Recommendations:  Advised to limit salt intake to 2000 mg/day and fluid intake to < 2 liters/day.  Encouraged to call for fluid symptoms.  Follow-up plan: ICM clinic phone  appointment on 12/06/2017.  Office appointment scheduled 11/05/2017 with Dr. Caryl Comes.  HF Clinic appointment 10/11/2017  Copy of ICM check sent to Dr. Caryl Comes.   3 month ICM trend: 10/04/2017    1 Year ICM trend:       Rosalene Billings, RN 10/05/2017 12:31 PM

## 2017-10-05 NOTE — Progress Notes (Signed)
Remote ICD transmission.   

## 2017-10-11 ENCOUNTER — Ambulatory Visit (HOSPITAL_COMMUNITY)
Admission: RE | Admit: 2017-10-11 | Discharge: 2017-10-11 | Disposition: A | Discharge status: Home / Self Care | Payer: Medicaid Other | Source: Ambulatory Visit | Attending: Cardiology | Admitting: Cardiology

## 2017-10-11 ENCOUNTER — Encounter (HOSPITAL_COMMUNITY): Payer: Self-pay

## 2017-10-11 VITALS — BP 124/82 | HR 94 | Wt 319.0 lb

## 2017-10-11 DIAGNOSIS — I428 Other cardiomyopathies: Secondary | ICD-10-CM | POA: Diagnosis not present

## 2017-10-11 DIAGNOSIS — G4733 Obstructive sleep apnea (adult) (pediatric): Secondary | ICD-10-CM | POA: Diagnosis not present

## 2017-10-11 DIAGNOSIS — M19079 Primary osteoarthritis, unspecified ankle and foot: Secondary | ICD-10-CM

## 2017-10-11 DIAGNOSIS — I5022 Chronic systolic (congestive) heart failure: Secondary | ICD-10-CM | POA: Diagnosis present

## 2017-10-11 DIAGNOSIS — Z6841 Body Mass Index (BMI) 40.0 and over, adult: Secondary | ICD-10-CM | POA: Diagnosis not present

## 2017-10-11 DIAGNOSIS — Z7902 Long term (current) use of antithrombotics/antiplatelets: Secondary | ICD-10-CM | POA: Diagnosis not present

## 2017-10-11 DIAGNOSIS — I484 Atypical atrial flutter: Secondary | ICD-10-CM

## 2017-10-11 DIAGNOSIS — Z8249 Family history of ischemic heart disease and other diseases of the circulatory system: Secondary | ICD-10-CM | POA: Insufficient documentation

## 2017-10-11 DIAGNOSIS — E785 Hyperlipidemia, unspecified: Secondary | ICD-10-CM | POA: Diagnosis not present

## 2017-10-11 DIAGNOSIS — Z9889 Other specified postprocedural states: Secondary | ICD-10-CM | POA: Diagnosis not present

## 2017-10-11 DIAGNOSIS — I429 Cardiomyopathy, unspecified: Secondary | ICD-10-CM | POA: Diagnosis not present

## 2017-10-11 DIAGNOSIS — Z79899 Other long term (current) drug therapy: Secondary | ICD-10-CM | POA: Diagnosis not present

## 2017-10-11 DIAGNOSIS — I480 Paroxysmal atrial fibrillation: Secondary | ICD-10-CM | POA: Insufficient documentation

## 2017-10-11 DIAGNOSIS — M109 Gout, unspecified: Secondary | ICD-10-CM | POA: Diagnosis not present

## 2017-10-11 DIAGNOSIS — N184 Chronic kidney disease, stage 4 (severe): Secondary | ICD-10-CM | POA: Diagnosis not present

## 2017-10-11 DIAGNOSIS — I130 Hypertensive heart and chronic kidney disease with heart failure and stage 1 through stage 4 chronic kidney disease, or unspecified chronic kidney disease: Secondary | ICD-10-CM | POA: Insufficient documentation

## 2017-10-11 DIAGNOSIS — N529 Male erectile dysfunction, unspecified: Secondary | ICD-10-CM | POA: Diagnosis not present

## 2017-10-11 LAB — BASIC METABOLIC PANEL
Anion gap: 13 (ref 5–15)
BUN: 55 mg/dL — AB (ref 6–20)
CALCIUM: 8.9 mg/dL (ref 8.9–10.3)
CHLORIDE: 101 mmol/L (ref 101–111)
CO2: 29 mmol/L (ref 22–32)
CREATININE: 4.26 mg/dL — AB (ref 0.61–1.24)
GFR calc non Af Amer: 14 mL/min — ABNORMAL LOW (ref >60)
GFR, EST AFRICAN AMERICAN: 16 mL/min — AB (ref >60)
Glucose, Bld: 119 mg/dL — ABNORMAL HIGH (ref 65–99)
Potassium: 3.5 mmol/L (ref 3.5–5.1)
SODIUM: 143 mmol/L (ref 135–145)

## 2017-10-11 NOTE — Progress Notes (Signed)
Patient ID: Eric Murillo, male   DOB: June 29, 1961, 57 y.o.   MRN: [id number]    Advanced Heart Failure Clinic Note  PCP: Dr. Allena Katz Cardiology: Dr Donia Ast Zylstra is a 57 y.o. male with history of morbid obesity, hyperlipidemia, HTN, atrial flutter (s/p TEE DC-CV 04/03/14 and again in 8/17), NICM with chronic systolic HF and severe OSA but unable to tolerate CPAP.    He was admitted in 04/2014 with increased exertional dyspnea and chest tightness.  RHC/LHC showed elevated filling pressures, preserved cardiac output, and no significant coronary disease.  He was diuresed and discharged home. TEE (9/15) with EF 25-30% with global hypokinesis, mild to moderate MR, mildly decreased RV systolic function.   He underwent work-up for cardiac amyloid. SPEP negative. Fat pad biopsy 6/16 with benign adipose tissue. Unable to get cMRI due to size.  Echo in 8/17 showed EF 20-25%, diffuse hypokinesis, PASP 48 mmHg.  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    He was admitted in 8/17 with acute on chronic systolic CHF in setting of atypical atrial flutter.  He was diuresed and had TEE-guided DCCV to NSR.  Amiodarone was begun.   Admitted 11/13 -> 11/19 with atypical Aflutter and underwent TEE/DCCV on 06/06/17. Remains in NSR up to day of discharge. Discharge weight 309 lbs.  He presents today for regular follow up. Down 7 lbs from last visit with extra metolazone.  He has not needed any further prn metolazone since then.  He is not SOB with ADLs.  Dr. Hyman Hopes following for CKD IV-V. No plans for dialysis at this time. He gets occasional lightheadedness with rapid standing, but not marked or limiting. Denies today. Denies CP or palpitations.   Corvue: Personally interrogated in clinic No VT/VF. Thoracic impedence up and down. Currently below threshold.   Echo 06/05/2017: EF 20-25%, diffuse hypokinesis, moderately dilated LV, mildly decreased RV systolic function, PASP 66 mmHg.   Labs (08/21/2014) K 3.9 Creatinine  1.78 Labs (08/04/2015) : K 4.2 Creatinine 1.66 BNP 173 Labs (6/17): K 4.3, creatinine 1.87 Labs (8/17): K 3.9, creatinine 2.27, HCT 41.2 Labs (1/18): K 3.9, creatinine 2.81 Labs (06/27/2017): K 3.3 Creatinine 2.97   ECG (2/17): NSR, 1st degree AV block, LAFB, poor RWP, low voltage.   Past Medical History: 1. Nonischemic cardiomyopathy: Echo (3/12) with EF 30-35% and mildly dilated LV, diffuse LV hypokinesis, moderate MR, PA systolic pressure 55 mmHg.  Left and right heart cath (3/12): No angiographic CAD; mean RA 20, PA 76/41, mean PCWP 38, CI 2.1.  ANA, SPEP, and HIV negative.  TSH normal.  Denies drug abuse, heavy ETOH intake.  No family history of cardiomyopathy.  Cardiomyopathy may be due to long-standing HTN.  Echo (6/13): EF 35-40%, moderate LV dilation, moderate to severe LAE, PA systolic pressure 52 mmHg.  Unable to fit in magnet for cardiac MRI.  Echo (8/14) with EF 45%, moderately dilated LV, mild LVH, normal RV, PA systolic pressure 49 mmHg. TEE (9/15) with EF 25-30% with global hypokinesis, mild to moderate MR, mildly decreased RV systolic function. LHC/RHC (10/15) with no significant coronary disease; mean RA 14, PA 65/25 mean 42, unable to obtain PCWP but LVEDP 24, CI 2.69.  Echo (6/16) with EF 30-35%, PA systolic pressure 62 mmHg, RV normal size and systolic function. [employer] ICD.  - Echo (8/17): EF 20-25%, mild LV dilation, diffuse hypokinesis, PASP 48 mmHg.  2. ERECTILE DYSFUNCTION 3. HYPERTENSION  4. CKD: Stage III 5. DYSLIPIDEMIA  6. Obesity 7. Hyperlipidemia  8. Gout 9. OSA: Severe on 6/13 sleep study. Unable to use CPAP.  10. Sialolithiasis 11. Atypical Atrial Flutter: TEE-DC-CV (04/03/14) which was successful; TEE-DCCV in 8/17 was successful.  Now on amiodarone.    Family History: No history of cardiomyopathy No history of cancer among first degree relatives father - died of MI (53s)  mother - HTN  Social History: Lives girlfriend.  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 tobacco - none alcohol - none drugs - none  Review of systems complete and found to be negative unless listed in HPI.    Current Outpatient Medications  Medication Sig Dispense Refill  . acetaminophen (TYLENOL) 500 MG tablet Take 2 tablets (1,000 mg total) by mouth every 8 (eight) hours as needed. (Patient taking differently: Take 1,000 mg every 8 (eight) hours as needed by mouth (for pain or headaches). ) 90 tablet 2  . amiodarone (PACERONE) 200 MG tablet Take 1 tablet (200 mg total) by mouth daily. TAKE 2 TABLETS DAILY UNTIL 06/14/2017, and then resume 1 daily (Patient taking differently: Take 200 mg by mouth daily. ) 90 tablet 1  . carvedilol (COREG) 6.25 MG tablet Take 1 tablet (6.25 mg total) 2 (two) times daily with a meal by mouth. 30 tablet 1  . colchicine 0.6 MG tablet Take 0.6 mg 2 (two) times daily as needed by mouth (FOR GOUT FLARES). Reported on 01/18/2016    . diclofenac sodium (VOLTAREN) 1 % GEL Apply 4 g topically 4 (four) times daily. 1 Tube 2  . ELIQUIS 5 MG TABS tablet TAKE ONE TABLET BY MOUTH TWICE DAILY 180 tablet 1  . isosorbide-hydrALAZINE (BIDIL) 20-37.5 MG tablet Take 1 tablet 3 (three) times daily by mouth. 180 tablet 3  . metolazone (ZAROXOLYN) 2.5 MG tablet Take 1 tablet (2.5 mg total) by mouth once a week. Every Wednesday, take extra as directed 10 tablet 3  . potassium chloride SA (KLOR-CON M20) 20 MEQ tablet Take 2 tablets (40 mEq total) by mouth 3 (three) times daily. 180 tablet 6  . torsemide (DEMADEX) 20 MG tablet Take 5 tablets (100 mg total) by mouth 2 (two) times daily. 300 tablet 6  . allopurinol (ZYLOPRIM) 100 MG tablet TAKE 1 TABLET BY MOUTH TWO TIMES DAILY (Patient not taking: Reported on 10/11/2017) 60 tablet 2   No current facility-administered medications for this encounter.    Vitals:   10/11/17 1450  BP: 124/82  Pulse: 94  SpO2: 96%  Weight: (!) 319 lb (144.7 kg)   Wt Readings from Last 3 Encounters:  10/11/17 (!) 319 lb (144.7  kg)  08/30/17 (!) 326 lb 6.4 oz (148.1 kg)  08/17/17 (!) 317 lb (143.8 kg)   Physical Exam General: Well appearing. No resp difficulty. HEENT: Normal Neck: Supple. JVP 6-7 cm. Carotids 2+ bilat; no bruits. No thyromegaly or nodule noted. Cor: PMI nondisplaced. RRR, No M/G/R noted Lungs: CTAB, normal effort. Abdomen: Obese, soft, non-tender, non-distended, no HSM. No bruits or masses. +BS  Extremities: No cyanosis, clubbing, or rash. Chronic venous stasis. Trace ankle edema.  Neuro: Alert & orientedx3, cranial nerves grossly intact. moves all 4 extremities w/o difficulty. Affect pleasant   Assessment/Plan:  1. Chronic systolic heart failure: Nonischemic cardiomyopathy, likely related to HTN.  Echo in 8/14 with EF 45% but EF down to 25-30% on TEE while in atrial flutter (03/2014).  Echo (1/16) with EF ~25% and echo in 6/16 with EF 30-35%.  No cardiac MRI done with elevated creatinine and size.  There  was concern for cardiac amyloidosis.  However, negative SPEP and abdominal fat pad biopsy negative. Low voltage on ECG may be due to obesity and not amyloidosis. Most recent Echo in 12/2016 with EF 25-30%.  - NYHA II-III symptoms chronically.  - Volume status stable on torsemide 100 mg BID.  - Continue metolazone 2.5 mg q Wednesday. Can take extra only as needed.  - Continue carvedilol 6.25 mg BID with with 1st degree block on EKG - No spiro/dig/arb with CKD.  - Continue Bidil 1 tabs TID.  - Reinforced fluid restriction to < 2 L daily, sodium restriction to less than 2000 mg daily, and the importance of daily weights.    2. PAF / AFL - s/p DCCV/TEE 06/06/2017. - Regular on exam.  - Continue Eliquis 5 mg BID. Denies bleeding.  - Continue amiodarone 100 mg daily. Will need yearly eye exams. Check LFTs and TFTs next visit.  - Has been seen by EP. Not felt to be amenable to ablation.   3. Low voltage on ECG:  - SPEP and fat pad biopsy negative. No change.   4. CKD IV - BMET today.  - Sees  Dr. Hyman Hopes.   5. HTN:   - Stable on medications today.   6. OSA: - Working on getting nasal mask for CPAP.  No change.  - Working with Pulmonary to get a note that works with his profound orthopnea.   7. Morbid Obesity:  - Body mass index is 46.43 kg/m. - Needs to continue to try and lose weight.  Labs today. Stable on current meds. RTC 2 months.  Graciella Freer, PA-C  10/11/2017   Greater than 50% of the 25 minute visit was spent in counseling/coordination of care regarding disease state education, salt/fluid restriction, sliding scale diuretics, and medication compliance.

## 2017-10-11 NOTE — Patient Instructions (Signed)
No changes to medication at this time.  Routine lab work today. Will notify you of abnormal results, otherwise no news is good news!  Follow up 2 months with Andy Tillery PA-C. Take all medication as prescribed the day of your appointment. Bring all medications with you to your appointment.  Do the following things EVERYDAY: 1) Weigh yourself in the morning before breakfast. Write it down and keep it in a log. 2) Take your medicines as prescribed 3) Eat low salt foods-Limit salt (sodium) to 2000 mg per day.  4) Stay as active as you can everyday 5) Limit all fluids for the day to less than 2 liters  

## 2017-10-21 LAB — CUP PACEART REMOTE DEVICE CHECK
Battery Remaining Longevity: 83 mo
Battery Remaining Percentage: 80 %
Battery Voltage: 2.99 V
Brady Statistic RV Percent Paced: 1 %
Date Time Interrogation Session: 20190314161801
HighPow Impedance: 74 Ohm
HighPow Impedance: 74 Ohm
Implantable Lead Implant Date: 20161028
Implantable Lead Location: 753860
Implantable Lead Model: 181
Implantable Lead Serial Number: 332395
Implantable Pulse Generator Implant Date: 20161028
Lead Channel Impedance Value: 380 Ohm
Lead Channel Pacing Threshold Amplitude: 1 V
Lead Channel Pacing Threshold Pulse Width: 0.5 ms
Lead Channel Sensing Intrinsic Amplitude: 12 mV
Lead Channel Setting Pacing Amplitude: 2.5 V
Lead Channel Setting Pacing Pulse Width: 0.5 ms
Lead Channel Setting Sensing Sensitivity: 0.5 mV
Pulse Gen Serial Number: 7305918

## 2017-10-24 ENCOUNTER — Encounter: Payer: Medicaid Other | Admitting: Internal Medicine

## 2017-10-30 ENCOUNTER — Encounter: Payer: Self-pay | Admitting: Internal Medicine

## 2017-11-03 ENCOUNTER — Emergency Department (HOSPITAL_COMMUNITY): Payer: Medicaid Other

## 2017-11-03 ENCOUNTER — Inpatient Hospital Stay (HOSPITAL_COMMUNITY)
Admission: EM | Admit: 2017-11-03 | Discharge: 2017-11-22 | DRG: 252 | Disposition: A | Discharge status: Home / Self Care | Payer: Medicaid Other | Attending: Cardiology | Admitting: Cardiology

## 2017-11-03 ENCOUNTER — Encounter (HOSPITAL_COMMUNITY): Payer: Self-pay

## 2017-11-03 DIAGNOSIS — Z6841 Body Mass Index (BMI) 40.0 and over, adult: Secondary | ICD-10-CM

## 2017-11-03 DIAGNOSIS — Z8674 Personal history of sudden cardiac arrest: Secondary | ICD-10-CM

## 2017-11-03 DIAGNOSIS — G4733 Obstructive sleep apnea (adult) (pediatric): Secondary | ICD-10-CM

## 2017-11-03 DIAGNOSIS — E872 Acidosis: Secondary | ICD-10-CM | POA: Diagnosis not present

## 2017-11-03 DIAGNOSIS — I5043 Acute on chronic combined systolic (congestive) and diastolic (congestive) heart failure: Secondary | ICD-10-CM

## 2017-11-03 DIAGNOSIS — I4901 Ventricular fibrillation: Secondary | ICD-10-CM | POA: Diagnosis not present

## 2017-11-03 DIAGNOSIS — R0602 Shortness of breath: Secondary | ICD-10-CM

## 2017-11-03 DIAGNOSIS — E1122 Type 2 diabetes mellitus with diabetic chronic kidney disease: Secondary | ICD-10-CM | POA: Diagnosis present

## 2017-11-03 DIAGNOSIS — I462 Cardiac arrest due to underlying cardiac condition: Secondary | ICD-10-CM | POA: Diagnosis not present

## 2017-11-03 DIAGNOSIS — Z9289 Personal history of other medical treatment: Secondary | ICD-10-CM

## 2017-11-03 DIAGNOSIS — I4581 Long QT syndrome: Secondary | ICD-10-CM | POA: Diagnosis present

## 2017-11-03 DIAGNOSIS — Z95810 Presence of automatic (implantable) cardiac defibrillator: Secondary | ICD-10-CM

## 2017-11-03 DIAGNOSIS — I480 Paroxysmal atrial fibrillation: Secondary | ICD-10-CM | POA: Diagnosis present

## 2017-11-03 DIAGNOSIS — E1165 Type 2 diabetes mellitus with hyperglycemia: Secondary | ICD-10-CM | POA: Diagnosis not present

## 2017-11-03 DIAGNOSIS — Z8249 Family history of ischemic heart disease and other diseases of the circulatory system: Secondary | ICD-10-CM

## 2017-11-03 DIAGNOSIS — I472 Ventricular tachycardia: Secondary | ICD-10-CM

## 2017-11-03 DIAGNOSIS — Z95828 Presence of other vascular implants and grafts: Secondary | ICD-10-CM

## 2017-11-03 DIAGNOSIS — Z419 Encounter for procedure for purposes other than remedying health state, unspecified: Secondary | ICD-10-CM

## 2017-11-03 DIAGNOSIS — Z800 Family history of malignant neoplasm of digestive organs: Secondary | ICD-10-CM

## 2017-11-03 DIAGNOSIS — Z7901 Long term (current) use of anticoagulants: Secondary | ICD-10-CM

## 2017-11-03 DIAGNOSIS — J9601 Acute respiratory failure with hypoxia: Secondary | ICD-10-CM | POA: Diagnosis present

## 2017-11-03 DIAGNOSIS — R000 Tachycardia, unspecified: Secondary | ICD-10-CM

## 2017-11-03 DIAGNOSIS — Z9989 Dependence on other enabling machines and devices: Secondary | ICD-10-CM

## 2017-11-03 DIAGNOSIS — R451 Restlessness and agitation: Secondary | ICD-10-CM | POA: Diagnosis not present

## 2017-11-03 DIAGNOSIS — I4891 Unspecified atrial fibrillation: Secondary | ICD-10-CM

## 2017-11-03 DIAGNOSIS — I429 Cardiomyopathy, unspecified: Secondary | ICD-10-CM | POA: Diagnosis present

## 2017-11-03 DIAGNOSIS — Z79899 Other long term (current) drug therapy: Secondary | ICD-10-CM

## 2017-11-03 DIAGNOSIS — M1990 Unspecified osteoarthritis, unspecified site: Secondary | ICD-10-CM | POA: Diagnosis present

## 2017-11-03 DIAGNOSIS — E785 Hyperlipidemia, unspecified: Secondary | ICD-10-CM | POA: Diagnosis present

## 2017-11-03 DIAGNOSIS — D631 Anemia in chronic kidney disease: Secondary | ICD-10-CM | POA: Diagnosis present

## 2017-11-03 DIAGNOSIS — I5023 Acute on chronic systolic (congestive) heart failure: Secondary | ICD-10-CM

## 2017-11-03 DIAGNOSIS — Z978 Presence of other specified devices: Secondary | ICD-10-CM

## 2017-11-03 DIAGNOSIS — Z9119 Patient's noncompliance with other medical treatment and regimen: Secondary | ICD-10-CM

## 2017-11-03 DIAGNOSIS — N179 Acute kidney failure, unspecified: Secondary | ICD-10-CM

## 2017-11-03 DIAGNOSIS — I484 Atypical atrial flutter: Secondary | ICD-10-CM | POA: Diagnosis present

## 2017-11-03 DIAGNOSIS — N186 End stage renal disease: Secondary | ICD-10-CM

## 2017-11-03 DIAGNOSIS — M109 Gout, unspecified: Secondary | ICD-10-CM | POA: Diagnosis present

## 2017-11-03 DIAGNOSIS — Z4659 Encounter for fitting and adjustment of other gastrointestinal appliance and device: Secondary | ICD-10-CM

## 2017-11-03 DIAGNOSIS — Z9111 Patient's noncompliance with dietary regimen: Secondary | ICD-10-CM

## 2017-11-03 DIAGNOSIS — Z808 Family history of malignant neoplasm of other organs or systems: Secondary | ICD-10-CM

## 2017-11-03 DIAGNOSIS — E875 Hyperkalemia: Secondary | ICD-10-CM | POA: Diagnosis present

## 2017-11-03 DIAGNOSIS — I509 Heart failure, unspecified: Secondary | ICD-10-CM

## 2017-11-03 DIAGNOSIS — I959 Hypotension, unspecified: Secondary | ICD-10-CM | POA: Diagnosis not present

## 2017-11-03 DIAGNOSIS — Z992 Dependence on renal dialysis: Secondary | ICD-10-CM

## 2017-11-03 DIAGNOSIS — I132 Hypertensive heart and chronic kidney disease with heart failure and with stage 5 chronic kidney disease, or end stage renal disease: Principal | ICD-10-CM | POA: Diagnosis present

## 2017-11-03 DIAGNOSIS — N2581 Secondary hyperparathyroidism of renal origin: Secondary | ICD-10-CM | POA: Diagnosis not present

## 2017-11-03 DIAGNOSIS — N184 Chronic kidney disease, stage 4 (severe): Secondary | ICD-10-CM | POA: Diagnosis not present

## 2017-11-03 DIAGNOSIS — J9602 Acute respiratory failure with hypercapnia: Secondary | ICD-10-CM | POA: Diagnosis not present

## 2017-11-03 DIAGNOSIS — Z888 Allergy status to other drugs, medicaments and biological substances status: Secondary | ICD-10-CM

## 2017-11-03 DIAGNOSIS — G9341 Metabolic encephalopathy: Secondary | ICD-10-CM | POA: Diagnosis not present

## 2017-11-03 DIAGNOSIS — D696 Thrombocytopenia, unspecified: Secondary | ICD-10-CM | POA: Diagnosis not present

## 2017-11-03 LAB — CBC WITH DIFFERENTIAL/PLATELET
BASOS ABS: 0 10*3/uL (ref 0.0–0.1)
Basophils Relative: 0 %
EOS ABS: 0.1 10*3/uL (ref 0.0–0.7)
EOS PCT: 0 %
HCT: 35.3 % — ABNORMAL LOW (ref 39.0–52.0)
Hemoglobin: 10.6 g/dL — ABNORMAL LOW (ref 13.0–17.0)
Lymphocytes Relative: 7 %
Lymphs Abs: 1.4 10*3/uL (ref 0.7–4.0)
MCH: 26.5 pg (ref 26.0–34.0)
MCHC: 30 g/dL (ref 30.0–36.0)
MCV: 88.3 fL (ref 78.0–100.0)
MONO ABS: 2.7 10*3/uL — AB (ref 0.1–1.0)
Monocytes Relative: 14 %
Neutro Abs: 15 10*3/uL — ABNORMAL HIGH (ref 1.7–7.7)
Neutrophils Relative %: 79 %
PLATELETS: 161 10*3/uL (ref 150–400)
RBC: 4 MIL/uL — ABNORMAL LOW (ref 4.22–5.81)
RDW: 16.2 % — AB (ref 11.5–15.5)
WBC: 19.1 10*3/uL — ABNORMAL HIGH (ref 4.0–10.5)

## 2017-11-03 LAB — COMPREHENSIVE METABOLIC PANEL
ALBUMIN: 3.7 g/dL (ref 3.5–5.0)
ALT: 22 U/L (ref 17–63)
AST: 36 U/L (ref 15–41)
Alkaline Phosphatase: 155 U/L — ABNORMAL HIGH (ref 38–126)
Anion gap: 14 (ref 5–15)
BUN: 63 mg/dL — AB (ref 6–20)
CHLORIDE: 96 mmol/L — AB (ref 101–111)
CO2: 29 mmol/L (ref 22–32)
Calcium: 8.9 mg/dL (ref 8.9–10.3)
Creatinine, Ser: 3.77 mg/dL — ABNORMAL HIGH (ref 0.61–1.24)
GFR calc Af Amer: 19 mL/min — ABNORMAL LOW (ref >60)
GFR, EST NON AFRICAN AMERICAN: 16 mL/min — AB (ref >60)
GLUCOSE: 106 mg/dL — AB (ref 65–99)
POTASSIUM: 3.7 mmol/L (ref 3.5–5.1)
Sodium: 139 mmol/L (ref 135–145)
Total Bilirubin: 1.7 mg/dL — ABNORMAL HIGH (ref 0.3–1.2)
Total Protein: 7.1 g/dL (ref 6.5–8.1)

## 2017-11-03 LAB — I-STAT TROPONIN, ED: Troponin i, poc: 0.09 ng/mL (ref 0.00–0.08)

## 2017-11-03 LAB — BRAIN NATRIURETIC PEPTIDE: B NATRIURETIC PEPTIDE 5: 1160.1 pg/mL — AB (ref 0.0–100.0)

## 2017-11-03 LAB — TROPONIN I: TROPONIN I: 0.08 ng/mL — AB (ref <0.03)

## 2017-11-03 MED ORDER — FUROSEMIDE 10 MG/ML IJ SOLN: 40.0000 mg | Freq: Two times a day (BID) | INTRAMUSCULAR | Status: DC

## 2017-11-03 MED ORDER — ONDANSETRON HCL 4 MG/2ML IJ SOLN: 4.0000 mg | Freq: Four times a day (QID) | INTRAMUSCULAR | Status: DC | PRN

## 2017-11-03 MED ORDER — ACETAMINOPHEN 325 MG PO TABS
650.0000 mg | ORAL_TABLET | ORAL | Status: DC | PRN
  Administered 2017-11-03 – 2017-11-08 (×4): 650 mg via ORAL
  Filled 2017-11-03 (×4): qty 2

## 2017-11-03 MED ORDER — AMIODARONE HCL 200 MG PO TABS
200.0000 mg | ORAL_TABLET | Freq: Every day | ORAL | Status: DC
  Administered 2017-11-03 – 2017-11-11 (×8): 200 mg via ORAL
  Filled 2017-11-03 (×8): qty 1

## 2017-11-03 MED ORDER — POTASSIUM CHLORIDE CRYS ER 20 MEQ PO TBCR
40.0000 meq | EXTENDED_RELEASE_TABLET | Freq: Three times a day (TID) | ORAL | Status: DC
  Administered 2017-11-03 – 2017-11-08 (×16): 40 meq via ORAL
  Filled 2017-11-03 (×16): qty 2

## 2017-11-03 MED ORDER — ALLOPURINOL 100 MG PO TABS
100.0000 mg | ORAL_TABLET | Freq: Two times a day (BID) | ORAL | Status: DC
  Administered 2017-11-03 – 2017-11-11 (×17): 100 mg via ORAL
  Filled 2017-11-03 (×19): qty 1

## 2017-11-03 MED ORDER — SODIUM CHLORIDE 0.9% FLUSH
3.0000 mL | Freq: Two times a day (BID) | INTRAVENOUS | Status: DC
  Administered 2017-11-03 – 2017-11-11 (×9): 3 mL via INTRAVENOUS
  Administered 2017-11-12: 10 mL via INTRAVENOUS
  Administered 2017-11-14: 3 mL via INTRAVENOUS
  Administered 2017-11-14: 10 mL via INTRAVENOUS
  Administered 2017-11-15 – 2017-11-19 (×9): 3 mL via INTRAVENOUS

## 2017-11-03 MED ORDER — ACETAMINOPHEN 500 MG PO TABS
1000.0000 mg | ORAL_TABLET | Freq: Three times a day (TID) | ORAL | Status: DC | PRN
  Administered 2017-11-07: 1000 mg via ORAL
  Filled 2017-11-03: qty 2

## 2017-11-03 MED ORDER — HYDROCODONE-HOMATROPINE 5-1.5 MG/5ML PO SYRP
5.0000 mL | ORAL_SOLUTION | Freq: Four times a day (QID) | ORAL | Status: DC | PRN
  Administered 2017-11-03 – 2017-11-08 (×3): 5 mL via ORAL
  Filled 2017-11-03 (×3): qty 5

## 2017-11-03 MED ORDER — SODIUM CHLORIDE 0.9 % IV SOLN: 250.0000 mL | INTRAVENOUS | Status: DC | PRN

## 2017-11-03 MED ORDER — ISOSORB DINITRATE-HYDRALAZINE 20-37.5 MG PO TABS
1.0000 | ORAL_TABLET | Freq: Three times a day (TID) | ORAL | Status: DC
  Administered 2017-11-03 – 2017-11-08 (×15): 1 via ORAL
  Filled 2017-11-03 (×18): qty 1

## 2017-11-03 MED ORDER — FUROSEMIDE 10 MG/ML IJ SOLN
80.0000 mg | Freq: Three times a day (TID) | INTRAMUSCULAR | Status: DC
  Administered 2017-11-03 – 2017-11-04 (×3): 80 mg via INTRAVENOUS
  Filled 2017-11-03 (×4): qty 8

## 2017-11-03 MED ORDER — CARVEDILOL 6.25 MG PO TABS
6.2500 mg | ORAL_TABLET | Freq: Two times a day (BID) | ORAL | Status: DC
  Administered 2017-11-03 – 2017-11-11 (×16): 6.25 mg via ORAL
  Filled 2017-11-03 (×17): qty 1

## 2017-11-03 MED ORDER — SODIUM CHLORIDE 0.9% FLUSH
3.0000 mL | INTRAVENOUS | Status: DC | PRN
  Administered 2017-11-03: 3 mL via INTRAVENOUS
  Filled 2017-11-03: qty 3

## 2017-11-03 MED ORDER — APIXABAN 5 MG PO TABS
5.0000 mg | ORAL_TABLET | Freq: Two times a day (BID) | ORAL | Status: AC
  Administered 2017-11-03 – 2017-11-09 (×13): 5 mg via ORAL
  Filled 2017-11-03 (×15): qty 1

## 2017-11-03 NOTE — Progress Notes (Signed)
HF booklet provided and guided the patient through it  [place], [place]

## 2017-11-03 NOTE — Progress Notes (Signed)
New pt admission from ED. Pt brought to the floor in stable condition. Vitals taken. Initial Assessment done. All immediate pertinent needs to patient addressed. Patient Guide given to patient. Important safety instructions relating to hospitalization reviewed with patient. Patient verbalized understanding. Will continue to monitor pt.  Worley Radermacher, RN 

## 2017-11-03 NOTE — H&P (Signed)
Cardiology Admission History and Physical:   Patient ID: Eric Murillo; MRN: [id number]; DOB: 04/12/1961   Admission date: 11/03/2017  Primary Care Provider: No primary care provider on file. Primary Cardiologist: [name], MD  Primary Electrophysiologist:  Dr. Graciela Husbands  Chief Complaint:  Cough, shortness of breath  Patient Profile:   Eric Murillo is a 57 y.o. male with a history of atrial flutter, chronic systolic and diastolic heart failure, nonischemic cardiomyopathy, status post ICD, hypertension, untreated sleep apnea, CKD IV, and morbid obesity admitted with acute on chronic systolic and diastolic heart failure.  History of Present Illness:   Eric Murillo presented to the ED with cough and shortness of breath.  He notes increasing shortness of breath for the last two weeks.  He notes increased fluid intake recently. He last weighed himself one month ago and was 319.  He was happy with that weight and didn't want to check it anymore.  He reports orthopnea and PND.  He is unsure if his edema is changed from baseline.  He was concerned that he may have the flu but denies fever (one day of chills last week), myalgias and his cough is non-productive. He hasn't noted any palpitations but has been lightheaded.  Denies syncope.   In the ED he was found to be volume overloaded.  Troponin was mildly elevated to 0.09.  EKG showed atrial flutter with controlled ventricular rates.  BNP was elevated to 1160.  Cardiology was consulted for admission.   Eric Murillo underwent TEE/DC cardioversion on 9/15, 8/17, and 11/18.  He was started on amiodarone at that time.  He saw Dr. Elberta Fortis on 07/31/17 and was noted to have both atrial tachycardia and atrial flutter.  However he was felt to be a poor candidate for ablation given his untreated sleep apnea, multiple arrhythmias, and morbid obesity as well as his severely dilated left atrium.  He previously underwent left heart catheterization 04/2014 where he was  found to have elevated filling pressures and no significant coronary artery disease.  TEE at that time revealed LVEF 25-30% with global hypokinesis and mild to moderate mitral regurgitation and mildly decreased right ventricular systolic function.  He underwent a workup for cardiac amyloidosis and SPEP was negative.  He had a fat biopsy that was also unremarkable.  He never underwent cardiac MRI due to his size.Pressure transfusions and trending and the main thing for him to he was last seen in heart failure clinic 07/2017.  At that time his impedance levels were starting to rise.  He received IV Lasix in the clinic.  Plans were made for him to follow-up in  1 week but this did not occur.    Past Medical History:  Diagnosis Date  . AICD (automatic cardioverter/defibrillator) present   . Anemia of chronic disease   . Arthritis   . Atrial flutter (HCC)    DCCV 9/15  . Chest pain on exertion   . Chronic systolic congestive heart failure, NYHA class 2 (HCC)    EF 20-25% 1/16  . CKD (chronic kidney disease) stage 3, GFR 30-59 ml/min (HCC)   . Dyslipidemia   . Gout    Of big toe  . Hyperglycemia   . Hyperlipidemia   . Hypertension   . Morbid obesity with BMI of 45.0-49.9, adult (HCC)   . Non-ischemic cardiomyopathy (HCC)   . Organic erectile dysfunction   . OSA (obstructive sleep apnea)   . Pilonidal cyst   . Submandibular sialolithiasis    Right  Past Surgical History:  Procedure Laterality Date  . CARDIAC CATHETERIZATION  2012  . CARDIOVERSION N/A 04/03/2014   Procedure: CARDIOVERSION;  Surgeon: Laurey Morale, MD;  Location: [facility] ENDOSCOPY;  Service: Cardiovascular;  Laterality: N/A;  . CARDIOVERSION N/A 03/09/2016   Procedure: CARDIOVERSION;  Surgeon: Laurey Morale, MD;  Location: [facility] ENDOSCOPY;  Service: Cardiovascular;  Laterality: N/A;  . CARDIOVERSION N/A 06/06/2017   Procedure: CARDIOVERSION;  Surgeon: Laurey Morale, MD;  Location: [facility] ENDOSCOPY;  Service: Cardiovascular;   Laterality: N/A;  . COLONOSCOPY    . EP IMPLANTABLE DEVICE N/A 05/21/2015   Procedure: ICD Implant;  Surgeon: Duke Salvia, MD;  Location: [facility] INVASIVE CV LAB;  Service: Cardiovascular;  Laterality: N/A;  . LEFT AND RIGHT HEART CATHETERIZATION WITH CORONARY ANGIOGRAM N/A 04/24/2014   Procedure: LEFT AND RIGHT HEART CATHETERIZATION WITH CORONARY ANGIOGRAM;  Surgeon: Laurey Morale, MD;  Location: [facility] CATH LAB;  Service: Cardiovascular;  Laterality: N/A;  . SALIVARY GLAND SURGERY  09/12/2012  . SUBMANDIBULAR GLAND EXCISION Right 09/12/2012   Procedure: Removal Right Submandibular Larina Bras;  Surgeon: Serena Colonel, MD;  Location: [facility] OR;  Service: ENT;  Laterality: Right;  . TEE WITHOUT CARDIOVERSION N/A 04/03/2014   Procedure: TRANSESOPHAGEAL ECHOCARDIOGRAM (TEE);  Surgeon: Laurey Morale, MD;  Location: [facility] ENDOSCOPY;  Service: Cardiovascular;  Laterality: N/A;  . TEE WITHOUT CARDIOVERSION N/A 03/09/2016   Procedure: TRANSESOPHAGEAL ECHOCARDIOGRAM (TEE);  Surgeon: Laurey Morale, MD;  Location: [facility] ENDOSCOPY;  Service: Cardiovascular;  Laterality: N/A;  . TEE WITHOUT CARDIOVERSION N/A 06/06/2017   Procedure: TRANSESOPHAGEAL ECHOCARDIOGRAM (TEE);  Surgeon: Laurey Morale, MD;  Location: [facility] ENDOSCOPY;  Service: Cardiovascular;  Laterality: N/A;     Medications Prior to Admission: Prior to Admission medications   Medication Sig Start Date End Date Taking? Authorizing Provider  acetaminophen (TYLENOL) 500 MG tablet Take 2 tablets (1,000 mg total) by mouth every 8 (eight) hours as needed. Patient taking differently: Take 1,000 mg every 8 (eight) hours as needed by mouth (for pain or headaches).  02/07/17 02/07/18 Yes Darreld Mclean, MD  allopurinol (ZYLOPRIM) 100 MG tablet TAKE 1 TABLET BY MOUTH TWO TIMES DAILY 09/19/17  Yes Darreld Mclean, MD  amiodarone (PACERONE) 200 MG tablet Take 1 tablet (200 mg total) by mouth daily. TAKE 2 TABLETS DAILY UNTIL 06/14/2017, and then resume 1 daily Patient taking  differently: Take 200 mg by mouth daily.  06/27/17  Yes Darreld Mclean, MD  carvedilol (COREG) 6.25 MG tablet Take 1 tablet (6.25 mg total) 2 (two) times daily with a meal by mouth. 06/11/17  Yes Ginger Carne, MD  colchicine 0.6 MG tablet Take 0.6 mg 2 (two) times daily as needed by mouth (FOR GOUT FLARES). Reported on 01/18/2016   Yes [provider]  diclofenac sodium (VOLTAREN) 1 % GEL Apply 4 g topically 4 (four) times daily. 02/07/17  Yes Darreld Mclean, MD  ELIQUIS 5 MG TABS tablet TAKE ONE TABLET BY MOUTH TWICE DAILY 08/23/17  Yes Darreld Mclean, MD  isosorbide-hydrALAZINE (BIDIL) 20-37.5 MG tablet Take 1 tablet 3 (three) times daily by mouth. 06/11/17  Yes Ginger Carne, MD  metolazone (ZAROXOLYN) 2.5 MG tablet Take 1 tablet (2.5 mg total) by mouth once a week. Every Wednesday, take extra as directed 08/03/17  Yes Tillery, Mariam Dollar, PA-C  potassium chloride SA (KLOR-CON M20) 20 MEQ tablet Take 2 tablets (40 mEq total) by mouth 3 (three) times daily. 07/26/17  Yes Clegg, Amy D, NP  torsemide (DEMADEX) 20 MG tablet Take 5  tablets (100 mg total) by mouth 2 (two) times daily. 07/26/17  Yes Clegg, Amy D, NP     Allergies:    Allergies  Allergen Reactions  . Nsaids Other (See Comments)    Cannot take to due kidney issues  . Beta Adrenergic Blockers Other (See Comments)    "An unnamed, white-colored Beta Blocker made" him "feel funny" = made him feel cagey    Social History:   Social History   Socioeconomic History  . Marital status: Single    Spouse name: Not on file  . Number of children: Not on file  . Years of education: Not on file  . Highest education level: Not on file  Occupational History  . Not on file  Social Needs  . Financial resource strain: Not on file  . Food insecurity:    Worry: Not on file    Inability: Not on file  . Transportation needs:    Medical: Not on file    Non-medical: Not on file  Tobacco Use  . Smoking status: Never Smoker  . Smokeless  tobacco: Never Used  Substance and Sexual Activity  . Alcohol use: No    Alcohol/week: 0.0 oz  . Drug use: No  . Sexual activity: Not on file  Lifestyle  . Physical activity:    Days per week: Not on file    Minutes per session: Not on file  . Stress: Not on file  Relationships  . Social connections:    Talks on phone: Not on file    Gets together: Not on file    Attends religious service: Not on file    Active member of club or organization: Not on file    Attends meetings of clubs or organizations: Not on file    Relationship status: Not on file  . Intimate partner violence:    Fear of current or ex partner: Not on file    Emotionally abused: Not on file    Physically abused: Not on file    Forced sexual activity: Not on file  Other Topics Concern  . Not on file  Social History Narrative   Financial assistance approved for 100% discount at [employer] and has [facility] card   [employer]  March 16, 2010 9:42 AM      Lives with mother.  Has a girlfriend    Family History:   The patient's family history includes Cancer in his father; Colon cancer in his maternal aunt; Diabetes in his brother and sister; Hypertension in his father and mother. There is no history of Cardiomyopathy.    ROS:  Please see the history of present illness.  All other ROS reviewed and negative.     Physical Exam/Data:   Vitals:   11/03/17 1445 11/03/17 1500 11/03/17 1530 11/03/17 1545  BP: 100/87 106/75 107/90 124/90  Pulse: 71 64 64 68  Resp: 20 (!) 27 15 (!) 22  Temp:      TempSrc:      SpO2: 96% 94% 94% 94%  Weight:      Height:       No intake or output data in the 24 hours ending 11/03/17 1603 Filed Weights   11/03/17 1235 11/03/17 1406  Weight: (!) 319 lb (144.7 kg) (!) 332 lb 7.3 oz (150.8 kg)   VS:  BP 125/69   Pulse 66   Temp 99 F (37.2 C) (Oral)   Resp (!) 28   Ht 5' 9.5" (1.765 m)   [employer] Marland Kitchen)  332 lb 7.3 oz (150.8 kg)   SpO2 95%   BMI 48.39 kg/m  , BMI Body mass index is 48.39  kg/m. GENERAL:  Well appearing.  No acute distress. HEENT: Pupils equal round and reactive, fundi not visualized, oral mucosa unremarkable NECK:  + jugular venous distention to mandible sitting upright, waveform within normal limits, carotid upstroke brisk and symmetric, no bruits, no thyromegaly LYMPHATICS:  No cervical adenopathy LUNGS:  Clear to auscultation bilaterally HEART:  RRR.  PMI not displaced or sustained,S1 and S2 within normal limits, no S3, no S4, no clicks, no rubs, no murmurs ABD:  Flat, positive bowel sounds normal in frequency in pitch, no bruits, no rebound, no guarding, no midline pulsatile mass, no hepatomegaly, no splenomegaly EXT:  2 plus pulses throughout, no edema, no cyanosis no clubbing SKIN:  No rashes no nodules NEURO:  Cranial nerves II through XII grossly intact, motor grossly intact throughout PSYCH:  Cognitively intact, oriented to person place and time   EKG:  The ECG that was done 11/03/17 was personally reviewed and demonstrates atrial flutter at 73 bpm.  Low voltage. Absent R wave progression.   Relevant CV Studies:  Echo 06/05/17: Study Conclusions  - Left ventricle: The cavity size was moderately dilated. Systolic   function was severely reduced. The estimated ejection fraction   was in the range of 20% to 25%. Diffuse hypokinesis. - Aortic valve: Transvalvular velocity was within the normal range.   There was no stenosis. There was no regurgitation. - Mitral valve: Calcified annulus. Transvalvular velocity was   within the normal range. There was no evidence for stenosis.   There was moderate regurgitation. - Right ventricle: The cavity size was mildly dilated. Wall   thickness was normal. Systolic function was mildly reduced. - Right atrium: The atrium was severely dilated. - Atrial septum: A patent foramen ovale cannot be excluded. - Tricuspid valve: There was no regurgitation. - Pulmonary arteries: Systolic pressure was severely  increased. PA   peak pressure: 66 mm Hg (S).  Laboratory Data:  Chemistry Recent Labs  Lab 11/03/17 1237  NA 139  K 3.7  CL 96*  CO2 29  GLUCOSE 106*  BUN 63*  CREATININE 3.77*  CALCIUM 8.9  GFRNONAA 16*  GFRAA 19*  ANIONGAP 14    Recent Labs  Lab 11/03/17 1237  PROT 7.1  ALBUMIN 3.7  AST 36  ALT 22  ALKPHOS 155*  BILITOT 1.7*   Hematology Recent Labs  Lab 11/03/17 1237  WBC 19.1*  RBC 4.00*  HGB 10.6*  HCT 35.3*  MCV 88.3  MCH 26.5  MCHC 30.0  RDW 16.2*  PLT 161   Cardiac Enzymes Recent Labs  Lab 11/03/17 1237  TROPONINI 0.08*    Recent Labs  Lab 11/03/17 1301  TROPIPOC 0.09*    BNP Recent Labs  Lab 11/03/17 1238  BNP 1,160.1*    DDimer No results for input(s): DDIMER in the last 168 hours.  Radiology/Studies:  Dg Chest 2 View  Result Date: 11/03/2017 CLINICAL DATA:  Shortness of breath and cough for couple weeks EXAM: CHEST - 2 VIEW COMPARISON:  06/04/2017 FINDINGS: LEFT subclavian transvenous AICD lead with tip projecting at RIGHT ventricle. Enlargement of cardiac silhouette with pulmonary vascular congestion. Minimal interstitial infiltrates likely mild pulmonary edema. Mild RIGHT basilar atelectasis. No segmental consolidation, pleural effusion or pneumothorax. Bones unremarkable. IMPRESSION: Enlargement of cardiac silhouette with pulmonary vascular congestion and probable mild pulmonary edema, little changed. Electronically Signed   By: Loraine Leriche  Tyron Russell M.D.   On: 11/03/2017 13:00    Assessment and Plan:   # Acute on chronic systolic and diastolic heart failure: Eric Murillo is here with acute on chronic heart failure in the setting of dietary indiscretion and not weighing himself.  We will hold his home torsemide and start lasix 80mg  q8h.  1.5L fluid and 1500 mg sodium restrictions.  Continue carvedilol, and Bidil.  He is not on Entresto or spironolactone 2/2 CKD IV.  # longstanding persistent atrial flutter: Rates controlled on carvedilol.   He isn't a candidate for ablation. Continue Eliquis.        For questions or updates, please contact [facility] Please consult [email or web address] for contact info under Cardiology/STEMI.    Signed, Chilton Si, MD  11/03/2017 4:03 PM

## 2017-11-03 NOTE — ED Provider Notes (Signed)
[facility] EMERGENCY DEPARTMENT Provider Note   CSN: [id number] Arrival date & time: 11/03/17  1217     History   Chief Complaint Chief Complaint  Patient presents with  . Cough    HPI Eric Murillo is a 57 y.o. male presenting for evaluation of cough, shortness of breath, leg swelling, and dizziness.  Patient states that 2 days ago, he started to develop symptoms.  They began all at once.  He reports a nonproductive cough.  His shortness of breath, worse with exertion.  He has bilateral leg swelling, believes he is gained about 10-15 pounds over the past several days.  He reports dizziness with standing and exertion.  He denies chest pain, fevers, nausea, vomiting, abdominal pain, or abnormal bowel movements.  He states he is not urinating frequently, despite taking torsemide. He has not felt his AICD go off. He reports multiple sick contacts. States he is drinking a lot of fluids. His torsemide dose was increased a few weeks ago, no other change in medication, is till taking eliquis as prescribed.   HPI  Past Medical History:  Diagnosis Date  . AICD (automatic cardioverter/defibrillator) present   . Anemia of chronic disease   . Arthritis   . Atrial flutter (HCC)    DCCV 9/15  . Chest pain on exertion   . Chronic systolic congestive heart failure, NYHA class 2 (HCC)    EF 20-25% 1/16  . CKD (chronic kidney disease) stage 3, GFR 30-59 ml/min (HCC)   . Dyslipidemia   . Gout    Of big toe  . Hyperglycemia   . Hyperlipidemia   . Hypertension   . Morbid obesity with BMI of 45.0-49.9, adult (HCC)   . Non-ischemic cardiomyopathy (HCC)   . Organic erectile dysfunction   . OSA (obstructive sleep apnea)   . Pilonidal cyst   . Submandibular sialolithiasis    Right    Patient Active Problem List   Diagnosis Date Noted  . Acute on chronic diastolic heart failure (HCC) 11/03/2017  . 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 Long term current use of  anticoagulant therapy 04/07/2015  . Health care maintenance 03/10/2015  . Osteoarthritis 06/30/2014  . Anemia of chronic disease 04/21/2014  . Gout 04/21/2014  . Atrial flutter (HCC) 03/24/2014  . OSA (obstructive sleep apnea) 12/11/2011  . Cardiomyopathy, nonischemic (HCC) 09/30/2010  . Chronic systolic heart failure (HCC) 09/15/2010  . Hyperlipidemia 07/04/2006  . Morbid obesity with BMI of 45.0-49.9, adult (HCC) 07/04/2006  . Hypertensive cardiovascular disease 05/30/2006  . CKD (chronic kidney disease) stage 4, GFR 15-29 ml/min (HCC) 05/30/2006    Past Surgical History:  Procedure Laterality Date  . CARDIAC CATHETERIZATION  2012  . CARDIOVERSION N/A 04/03/2014   Procedure: CARDIOVERSION;  Surgeon: Laurey Morale, MD;  Location: [facility] ENDOSCOPY;  Service: Cardiovascular;  Laterality: N/A;  . CARDIOVERSION N/A 03/09/2016   Procedure: CARDIOVERSION;  Surgeon: Laurey Morale, MD;  Location: [facility] ENDOSCOPY;  Service: Cardiovascular;  Laterality: N/A;  . CARDIOVERSION N/A 06/06/2017   Procedure: CARDIOVERSION;  Surgeon: Laurey Morale, MD;  Location: [facility] ENDOSCOPY;  Service: Cardiovascular;  Laterality: N/A;  . COLONOSCOPY    . EP IMPLANTABLE DEVICE N/A 05/21/2015   Procedure: ICD Implant;  Surgeon: Duke Salvia, MD;  Location: [facility] INVASIVE CV LAB;  Service: Cardiovascular;  Laterality: N/A;  . LEFT AND RIGHT HEART CATHETERIZATION WITH CORONARY ANGIOGRAM N/A 04/24/2014   Procedure: LEFT AND RIGHT HEART CATHETERIZATION WITH CORONARY ANGIOGRAM;  Surgeon: Eliot Ford  Shirlee Latch, MD;  Location: [facility] CATH LAB;  Service: Cardiovascular;  Laterality: N/A;  . SALIVARY GLAND SURGERY  09/12/2012  . SUBMANDIBULAR GLAND EXCISION Right 09/12/2012   Procedure: Removal Right Submandibular Larina Bras;  Surgeon: Serena Colonel, MD;  Location: [facility] OR;  Service: ENT;  Laterality: Right;  . TEE WITHOUT CARDIOVERSION N/A 04/03/2014   Procedure: TRANSESOPHAGEAL ECHOCARDIOGRAM (TEE);  Surgeon: Laurey Morale, MD;  Location: [facility]  ENDOSCOPY;  Service: Cardiovascular;  Laterality: N/A;  . TEE WITHOUT CARDIOVERSION N/A 03/09/2016   Procedure: TRANSESOPHAGEAL ECHOCARDIOGRAM (TEE);  Surgeon: Laurey Morale, MD;  Location: [facility] ENDOSCOPY;  Service: Cardiovascular;  Laterality: N/A;  . TEE WITHOUT CARDIOVERSION N/A 06/06/2017   Procedure: TRANSESOPHAGEAL ECHOCARDIOGRAM (TEE);  Surgeon: Laurey Morale, MD;  Location: [facility] ENDOSCOPY;  Service: Cardiovascular;  Laterality: N/A;        Home Medications    Prior to Admission medications   Medication Sig Start Date End Date Taking? Authorizing Provider  acetaminophen (TYLENOL) 500 MG tablet Take 2 tablets (1,000 mg total) by mouth every 8 (eight) hours as needed. Patient taking differently: Take 1,000 mg every 8 (eight) hours as needed by mouth (for pain or headaches).  02/07/17 02/07/18 Yes Darreld Mclean, MD  allopurinol (ZYLOPRIM) 100 MG tablet TAKE 1 TABLET BY MOUTH TWO TIMES DAILY 09/19/17  Yes Darreld Mclean, MD  amiodarone (PACERONE) 200 MG tablet Take 1 tablet (200 mg total) by mouth daily. TAKE 2 TABLETS DAILY UNTIL 06/14/2017, and then resume 1 daily Patient taking differently: Take 200 mg by mouth daily.  06/27/17  Yes Darreld Mclean, MD  carvedilol (COREG) 6.25 MG tablet Take 1 tablet (6.25 mg total) 2 (two) times daily with a meal by mouth. 06/11/17  Yes Ginger Carne, MD  colchicine 0.6 MG tablet Take 0.6 mg 2 (two) times daily as needed by mouth (FOR GOUT FLARES). Reported on 01/18/2016   Yes [provider]  diclofenac sodium (VOLTAREN) 1 % GEL Apply 4 g topically 4 (four) times daily. 02/07/17  Yes Darreld Mclean, MD  ELIQUIS 5 MG TABS tablet TAKE ONE TABLET BY MOUTH TWICE DAILY 08/23/17  Yes Darreld Mclean, MD  isosorbide-hydrALAZINE (BIDIL) 20-37.5 MG tablet Take 1 tablet 3 (three) times daily by mouth. 06/11/17  Yes Ginger Carne, MD  metolazone (ZAROXOLYN) 2.5 MG tablet Take 1 tablet (2.5 mg total) by mouth once a week. Every Wednesday, take extra as directed  08/03/17  Yes Tillery, Mariam Dollar, PA-C  potassium chloride SA (KLOR-CON M20) 20 MEQ tablet Take 2 tablets (40 mEq total) by mouth 3 (three) times daily. 07/26/17  Yes Clegg, Amy D, NP  torsemide (DEMADEX) 20 MG tablet Take 5 tablets (100 mg total) by mouth 2 (two) times daily. 07/26/17  Yes Clegg, Amy D, NP    Family History Family History  Problem Relation Age of Onset  . Hypertension Mother   . Hypertension Father   . Cancer Father        brother died of brain cancer; sister died of bone cancer  . Diabetes Sister   . Diabetes Brother   . Colon cancer Maternal Aunt   . Cardiomyopathy Neg Hx     Social History Social History   Tobacco Use  . Smoking status: Never Smoker  . Smokeless tobacco: Never Used  Substance Use Topics  . Alcohol use: No    Alcohol/week: 0.0 oz  . Drug use: No     Allergies   Nsaids and Beta adrenergic blockers   Review of Systems Review of  Systems  Respiratory: Positive for cough and shortness of breath.   Cardiovascular: Positive for leg swelling.  Neurological: Positive for dizziness.  Hematological: Bruises/bleeds easily.  All other systems reviewed and are negative.    Physical Exam Updated Vital Signs BP 124/90   Pulse 68   Temp 99 F (37.2 C) (Oral)   Resp (!) 22   Ht 5' 9.5" (1.765 m)   Wt (!) 150.8 kg (332 lb 7.3 oz)   SpO2 94%   BMI 48.39 kg/m   Physical Exam  Constitutional: He is oriented to person, place, and time. He appears well-developed and well-nourished.  In bed in NAD  HENT:  Head: Normocephalic and atraumatic.  Mouth/Throat: Uvula is midline, oropharynx is clear and moist and mucous membranes are normal.  Eyes: Pupils are equal, round, and reactive to light. Conjunctivae and EOM are normal.  Neck: Normal range of motion. Neck supple.  Cardiovascular: Normal rate, regular rhythm and intact distal pulses.  Pulmonary/Chest: Effort normal. No respiratory distress. He has decreased breath sounds. He has no  wheezes. He has no rhonchi. He has no rales.  Decreased lung sounds in all fields, likely due to body habitus  Abdominal: Soft. He exhibits no distension and no mass. There is no tenderness. There is no guarding.  Musculoskeletal: Normal range of motion. He exhibits edema.  BLE 2+pitting edema. Pedal pulses intact.   Neurological: He is alert and oriented to person, place, and time.  Skin: Skin is warm and dry.  Psychiatric: He has a normal mood and affect.  Nursing note and vitals reviewed.    ED Treatments / Results  Labs (all labs ordered are listed, but only abnormal results are displayed) Labs Reviewed  CBC WITH DIFFERENTIAL/PLATELET - Abnormal; Notable for the following components:      Result Value   WBC 19.1 (*)    RBC 4.00 (*)    Hemoglobin 10.6 (*)    HCT 35.3 (*)    RDW 16.2 (*)    Neutro Abs 15.0 (*)    Monocytes Absolute 2.7 (*)    All other components within normal limits  COMPREHENSIVE METABOLIC PANEL - Abnormal; Notable for the following components:   Chloride 96 (*)    Glucose, Bld 106 (*)    BUN 63 (*)    Creatinine, Ser 3.77 (*)    Alkaline Phosphatase 155 (*)    Total Bilirubin 1.7 (*)    GFR calc non Af Amer 16 (*)    GFR calc Af Amer 19 (*)    All other components within normal limits  BRAIN NATRIURETIC PEPTIDE - Abnormal; Notable for the following components:   B Natriuretic Peptide 1,160.1 (*)    All other components within normal limits  TROPONIN I - Abnormal; Notable for the following components:   Troponin I 0.08 (*)    All other components within normal limits  I-STAT TROPONIN, ED - Abnormal; Notable for the following components:   Troponin i, poc 0.09 (*)    All other components within normal limits    EKG EKG Interpretation  Date/Time:  Saturday November 03 2017 13:52:23 EDT Ventricular Rate:  76 PR Interval:    QRS Duration: 107 QT Interval:  603 QTC Calculation: 679 R Axis:   -149 Text Interpretation:  Atrial flutter Probable  lateral infarct, age indeterminate Since last tracing of earlier today now artifact has resolved and QT prolonged, but low voltage prevents accurate assessment of QT interval Confirmed by Mancel Bale [phone]) on 11/03/2017  1:59:16 PM   Radiology Dg Chest 2 View  Result Date: 11/03/2017 CLINICAL DATA:  Shortness of breath and cough for couple weeks EXAM: CHEST - 2 VIEW COMPARISON:  06/04/2017 FINDINGS: LEFT subclavian transvenous AICD lead with tip projecting at RIGHT ventricle. Enlargement of cardiac silhouette with pulmonary vascular congestion. Minimal interstitial infiltrates likely mild pulmonary edema. Mild RIGHT basilar atelectasis. No segmental consolidation, pleural effusion or pneumothorax. Bones unremarkable. IMPRESSION: Enlargement of cardiac silhouette with pulmonary vascular congestion and probable mild pulmonary edema, little changed. Electronically Signed   By: Ulyses Southward M.D.   On: 11/03/2017 13:00    Procedures Procedures (including critical care time)  Medications Ordered in ED Medications  sodium chloride flush (NS) 0.9 % injection 3 mL (has no administration in time range)  sodium chloride flush (NS) 0.9 % injection 3 mL (has no administration in time range)  0.9 %  sodium chloride infusion (has no administration in time range)  acetaminophen (TYLENOL) tablet 650 mg (has no administration in time range)  ondansetron (ZOFRAN) injection 4 mg (has no administration in time range)  acetaminophen (TYLENOL) tablet 1,000 mg (has no administration in time range)  allopurinol (ZYLOPRIM) tablet 100 mg (has no administration in time range)  amiodarone (PACERONE) tablet 200 mg (has no administration in time range)  carvedilol (COREG) tablet 6.25 mg (has no administration in time range)  apixaban (ELIQUIS) tablet 5 mg (has no administration in time range)  isosorbide-hydrALAZINE (BIDIL) 20-37.5 MG per tablet 1 tablet (has no administration in time range)  potassium chloride SA  (K-DUR,KLOR-CON) CR tablet 40 mEq (has no administration in time range)  furosemide (LASIX) injection 80 mg (has no administration in time range)     Initial Impression / Assessment and Plan / ED Course  I have reviewed the triage vital signs and the nursing notes.  Pertinent labs & imaging results that were available during my care of the patient were reviewed by me and considered in my medical decision making (see chart for details).     Patient presenting for evaluation of cough, shortness of breath, and leg swelling.  Physical exam reassuring, patient is afebrile not tachycardic.  He appears nontoxic.  2+ pitting edema bilaterally.  Patient report and chart review, weight has increased in the past several weeks.  Chest x-ray shows congestion without obvious infection.  EKG shows a flutter.  Leukocytosis of 19.1, doubt infection, especially considering patient is afebrile and without an obvious source.  Creatinine stable at 3.7.  Troponin elevated at 0.09, I believe this is likely due to CHF, doubt ACS at this time.  Case discussed with attending, Dr. Effie Shy evaluated patient.  Will consult with cardiology for further management.  Discussed with cardiology, pt to be admitted under their service. Pt understands and agrees to plan.    Final Clinical Impressions(s) / ED Diagnoses   Final diagnoses:  Acute on chronic congestive heart failure, unspecified heart failure type [facility])    ED Discharge Orders    None       Alveria Apley, PA-C 11/03/17 1654    Mancel Bale, MD 11/03/17 2250

## 2017-11-03 NOTE — Progress Notes (Signed)
Paged Cardiology per headache 8/10 despite haven given Tylenol 1 hour ago.

## 2017-11-03 NOTE — ED Triage Notes (Signed)
Pt presents with 2 day h/o productive cough; dizziness and generalized edema.  Pt reports MD adjusted torosimide recently; reports having shortness of breath especially with exertion.  Reports having palpitations.

## 2017-11-03 NOTE — ED Provider Notes (Signed)
  Face-to-face evaluation   History: He presents for evaluation of cough, dizziness and swelling.  He reports cough occasionally productive of sputum for several days and having some nosebleeding.  He denies fever or chills.  He has not been monitoring his weight at home, and states his slight weight is 319 pounds.  He reports he is taking his usual medications.  Physical exam: Morbidly obese, alert and cooperative.  Lungs clear.  No respiratory distress.   Medical screening examination/treatment/procedure(s) were conducted as a shared visit with non-physician practitioner(s) and myself.  I personally evaluated the patient during the encounter    Mancel Bale, MD 11/03/17 2250

## 2017-11-04 ENCOUNTER — Other Ambulatory Visit: Payer: Self-pay

## 2017-11-04 DIAGNOSIS — Z978 Presence of other specified devices: Secondary | ICD-10-CM | POA: Diagnosis not present

## 2017-11-04 DIAGNOSIS — D696 Thrombocytopenia, unspecified: Secondary | ICD-10-CM | POA: Diagnosis not present

## 2017-11-04 DIAGNOSIS — N179 Acute kidney failure, unspecified: Secondary | ICD-10-CM | POA: Diagnosis present

## 2017-11-04 DIAGNOSIS — G4733 Obstructive sleep apnea (adult) (pediatric): Secondary | ICD-10-CM | POA: Diagnosis not present

## 2017-11-04 DIAGNOSIS — I429 Cardiomyopathy, unspecified: Secondary | ICD-10-CM | POA: Diagnosis present

## 2017-11-04 DIAGNOSIS — Z01810 Encounter for preprocedural cardiovascular examination: Secondary | ICD-10-CM | POA: Diagnosis not present

## 2017-11-04 DIAGNOSIS — E1122 Type 2 diabetes mellitus with diabetic chronic kidney disease: Secondary | ICD-10-CM | POA: Diagnosis present

## 2017-11-04 DIAGNOSIS — J9602 Acute respiratory failure with hypercapnia: Secondary | ICD-10-CM | POA: Diagnosis not present

## 2017-11-04 DIAGNOSIS — N2581 Secondary hyperparathyroidism of renal origin: Secondary | ICD-10-CM | POA: Diagnosis not present

## 2017-11-04 DIAGNOSIS — I484 Atypical atrial flutter: Secondary | ICD-10-CM | POA: Diagnosis present

## 2017-11-04 DIAGNOSIS — D631 Anemia in chronic kidney disease: Secondary | ICD-10-CM | POA: Diagnosis present

## 2017-11-04 DIAGNOSIS — I5033 Acute on chronic diastolic (congestive) heart failure: Secondary | ICD-10-CM

## 2017-11-04 DIAGNOSIS — N185 Chronic kidney disease, stage 5: Secondary | ICD-10-CM | POA: Diagnosis not present

## 2017-11-04 DIAGNOSIS — E1165 Type 2 diabetes mellitus with hyperglycemia: Secondary | ICD-10-CM | POA: Diagnosis not present

## 2017-11-04 DIAGNOSIS — I469 Cardiac arrest, cause unspecified: Secondary | ICD-10-CM | POA: Diagnosis not present

## 2017-11-04 DIAGNOSIS — Z6841 Body Mass Index (BMI) 40.0 and over, adult: Secondary | ICD-10-CM | POA: Diagnosis not present

## 2017-11-04 DIAGNOSIS — I462 Cardiac arrest due to underlying cardiac condition: Secondary | ICD-10-CM | POA: Diagnosis not present

## 2017-11-04 DIAGNOSIS — I4892 Unspecified atrial flutter: Secondary | ICD-10-CM | POA: Diagnosis not present

## 2017-11-04 DIAGNOSIS — I509 Heart failure, unspecified: Secondary | ICD-10-CM | POA: Diagnosis present

## 2017-11-04 DIAGNOSIS — G9341 Metabolic encephalopathy: Secondary | ICD-10-CM | POA: Diagnosis not present

## 2017-11-04 DIAGNOSIS — I480 Paroxysmal atrial fibrillation: Secondary | ICD-10-CM | POA: Diagnosis not present

## 2017-11-04 DIAGNOSIS — I5023 Acute on chronic systolic (congestive) heart failure: Secondary | ICD-10-CM | POA: Diagnosis not present

## 2017-11-04 DIAGNOSIS — E785 Hyperlipidemia, unspecified: Secondary | ICD-10-CM | POA: Diagnosis present

## 2017-11-04 DIAGNOSIS — N186 End stage renal disease: Secondary | ICD-10-CM | POA: Diagnosis present

## 2017-11-04 DIAGNOSIS — E872 Acidosis: Secondary | ICD-10-CM | POA: Diagnosis not present

## 2017-11-04 DIAGNOSIS — J9601 Acute respiratory failure with hypoxia: Secondary | ICD-10-CM | POA: Diagnosis not present

## 2017-11-04 DIAGNOSIS — N184 Chronic kidney disease, stage 4 (severe): Secondary | ICD-10-CM | POA: Diagnosis not present

## 2017-11-04 DIAGNOSIS — E875 Hyperkalemia: Secondary | ICD-10-CM | POA: Diagnosis not present

## 2017-11-04 DIAGNOSIS — I483 Typical atrial flutter: Secondary | ICD-10-CM

## 2017-11-04 DIAGNOSIS — I5043 Acute on chronic combined systolic (congestive) and diastolic (congestive) heart failure: Secondary | ICD-10-CM | POA: Diagnosis present

## 2017-11-04 DIAGNOSIS — I340 Nonrheumatic mitral (valve) insufficiency: Secondary | ICD-10-CM | POA: Diagnosis not present

## 2017-11-04 DIAGNOSIS — I959 Hypotension, unspecified: Secondary | ICD-10-CM | POA: Diagnosis not present

## 2017-11-04 DIAGNOSIS — I4901 Ventricular fibrillation: Secondary | ICD-10-CM | POA: Diagnosis not present

## 2017-11-04 DIAGNOSIS — I132 Hypertensive heart and chronic kidney disease with heart failure and with stage 5 chronic kidney disease, or end stage renal disease: Secondary | ICD-10-CM | POA: Diagnosis present

## 2017-11-04 LAB — BASIC METABOLIC PANEL
Anion gap: 12 (ref 5–15)
BUN: 67 mg/dL — ABNORMAL HIGH (ref 6–20)
CALCIUM: 9 mg/dL (ref 8.9–10.3)
CO2: 30 mmol/L (ref 22–32)
CREATININE: 4 mg/dL — AB (ref 0.61–1.24)
Chloride: 97 mmol/L — ABNORMAL LOW (ref 101–111)
GFR calc Af Amer: 18 mL/min — ABNORMAL LOW (ref >60)
GFR calc non Af Amer: 15 mL/min — ABNORMAL LOW (ref >60)
Glucose, Bld: 114 mg/dL — ABNORMAL HIGH (ref 65–99)
Potassium: 4.1 mmol/L (ref 3.5–5.1)
SODIUM: 139 mmol/L (ref 135–145)

## 2017-11-04 MED ORDER — METOLAZONE 5 MG PO TABS
5.0000 mg | ORAL_TABLET | Freq: Once | ORAL | Status: AC
  Administered 2017-11-04: 5 mg via ORAL
  Filled 2017-11-04: qty 1

## 2017-11-04 MED ORDER — METOLAZONE 5 MG PO TABS
5.0000 mg | ORAL_TABLET | Freq: Every day | ORAL | Status: DC
  Administered 2017-11-04 – 2017-11-06 (×3): 5 mg via ORAL
  Filled 2017-11-04 (×3): qty 1

## 2017-11-04 MED ORDER — DEXTROSE 5 % IV SOLN
20.0000 mg/h | INTRAVENOUS | Status: DC
  Administered 2017-11-04: 10 mg/h via INTRAVENOUS
  Administered 2017-11-05 – 2017-11-09 (×6): 20 mg/h via INTRAVENOUS
  Filled 2017-11-04 (×2): qty 20
  Filled 2017-11-04: qty 25
  Filled 2017-11-04: qty 20
  Filled 2017-11-04: qty 21
  Filled 2017-11-04 (×2): qty 25
  Filled 2017-11-04: qty 20
  Filled 2017-11-04: qty 25
  Filled 2017-11-04: qty 23
  Filled 2017-11-04: qty 25

## 2017-11-04 NOTE — Progress Notes (Signed)
Patient wanting to sleep upright in chair in tripod position over bedside table for comfort.

## 2017-11-04 NOTE — Progress Notes (Addendum)
Weight gain of 0.816 kg despite Q8 hours 80mg  IV Lasix. Patient complaining he hasn't urinated much over night.  Intake Recommended and encouraged pt several times to reposition self, sit on bed or in recliner with legs elevated, pt refused.  Pt states it is difficult for him to sit in any position in which his (obese) abdomen is being pushed upward (ie legs elevated in bed or in recliner), impinging on his breathing and prefers to remain sitting in chair.    Gave pt a few options of positioning in bed and recliner and refused.  Results for MADDOX, MARCHESANO (MRN [id number]) as of 11/04/2017 05:53  Ref. Range 11/04/2017 03:29  BUN Latest Ref Range: 6 - 20 mg/dL 67 (H)  Creatinine Latest Ref Range: 0.61 - 1.24 mg/dL 6.23 (H)

## 2017-11-04 NOTE — Consult Note (Addendum)
Renal Service Consult Note [facility]  Jebidiah Baggerly 11/04/2017 Maree Krabbe Requesting Physician:  Dr Duke Salvia  Reason for Consult:  CKD pt with vol overload HPI: The patient is a 57 y.o. year-old with hx of s/d CHF, CKD IV, HTN, NICM, afib/ flutter. Patient was admitted 4/13 yesterday with worsening SOB/ leg edema and admitted for diuresis.  Has had multiple admits in the past 7 yrs for decomp CHF.  He didn't respond well to IV lasix 40 or 80mg  dose, and today was switched to IV lasix drip at 10 mg/hr.  Asked to see for CKD w vol overload.   Pt having some DOE, no SOB at rest.  Has chronic LE edema he says is not much different than usual.  R handed, sees Dr Hyman Hopes at [facility] regularly , they have talked a little of dialysis but not in detail.  Single, lives w/ his mother in [place].  Had uncle and aunt on dialysis.   Prior hosp admissions: 3/12 - a/c sCHF, R/L cath normal cor's, EF 30%, ^^ filling pressures, also CKD3/ HTN 10/15 - a/c sdCHF exacerbation, CKD 3, , HTN, NICM, recent DCCV for afib on eliquis 10/16 - elective ICD implant 8/17 - decomp CHF, diuresed 8/18 - decomp CHF, diuresed, another DCCV for afib/flutter, CKD IV creat 4 > 2.8    ROS  denies CP  no joint pain   no HA  no blurry vision  no rash  no diarrhea  no nausea/ vomiting  no dysuria  no difficulty voiding  no change in urine color    Past Medical History  Past Medical History:  Diagnosis Date  . AICD (automatic cardioverter/defibrillator) present   . Anemia of chronic disease   . Arthritis   . Atrial flutter (HCC)    DCCV 9/15  . Chest pain on exertion   . Chronic systolic congestive heart failure, NYHA class 2 (HCC)    EF 20-25% 1/16  . CKD (chronic kidney disease) stage 3, GFR 30-59 ml/min (HCC)   . Dyslipidemia   . Gout    Of big toe  . Hyperglycemia   . Hyperlipidemia   . Hypertension   . Morbid obesity with BMI of 45.0-49.9, adult (HCC)   . Non-ischemic cardiomyopathy (HCC)    . Organic erectile dysfunction   . OSA (obstructive sleep apnea)   . Pilonidal cyst   . Submandibular sialolithiasis    Right   Past Surgical History  Past Surgical History:  Procedure Laterality Date  . CARDIAC CATHETERIZATION  2012  . CARDIOVERSION N/A 04/03/2014   Procedure: CARDIOVERSION;  Surgeon: Laurey Morale, MD;  Location: [facility] ENDOSCOPY;  Service: Cardiovascular;  Laterality: N/A;  . CARDIOVERSION N/A 03/09/2016   Procedure: CARDIOVERSION;  Surgeon: Laurey Morale, MD;  Location: [facility] ENDOSCOPY;  Service: Cardiovascular;  Laterality: N/A;  . CARDIOVERSION N/A 06/06/2017   Procedure: CARDIOVERSION;  Surgeon: Laurey Morale, MD;  Location: [facility] ENDOSCOPY;  Service: Cardiovascular;  Laterality: N/A;  . COLONOSCOPY    . EP IMPLANTABLE DEVICE N/A 05/21/2015   Procedure: ICD Implant;  Surgeon: Duke Salvia, MD;  Location: [facility] INVASIVE CV LAB;  Service: Cardiovascular;  Laterality: N/A;  . LEFT AND RIGHT HEART CATHETERIZATION WITH CORONARY ANGIOGRAM N/A 04/24/2014   Procedure: LEFT AND RIGHT HEART CATHETERIZATION WITH CORONARY ANGIOGRAM;  Surgeon: Laurey Morale, MD;  Location: [facility] CATH LAB;  Service: Cardiovascular;  Laterality: N/A;  . SALIVARY GLAND SURGERY  09/12/2012  . SUBMANDIBULAR GLAND EXCISION Right 09/12/2012  Procedure: Removal Right Submandibular Larina Bras;  Surgeon: Serena Colonel, MD;  Location: [facility] OR;  Service: ENT;  Laterality: Right;  . TEE WITHOUT CARDIOVERSION N/A 04/03/2014   Procedure: TRANSESOPHAGEAL ECHOCARDIOGRAM (TEE);  Surgeon: Laurey Morale, MD;  Location: [facility] ENDOSCOPY;  Service: Cardiovascular;  Laterality: N/A;  . TEE WITHOUT CARDIOVERSION N/A 03/09/2016   Procedure: TRANSESOPHAGEAL ECHOCARDIOGRAM (TEE);  Surgeon: Laurey Morale, MD;  Location: [facility] ENDOSCOPY;  Service: Cardiovascular;  Laterality: N/A;  . TEE WITHOUT CARDIOVERSION N/A 06/06/2017   Procedure: TRANSESOPHAGEAL ECHOCARDIOGRAM (TEE);  Surgeon: Laurey Morale, MD;  Location: [facility] ENDOSCOPY;  Service:  Cardiovascular;  Laterality: N/A;   Family History  Family History  Problem Relation Age of Onset  . Hypertension Mother   . Hypertension Father   . Cancer Father        brother died of brain cancer; sister died of bone cancer  . Diabetes Sister   . Diabetes Brother   . Colon cancer Maternal Aunt   . Cardiomyopathy Neg Hx    Social History  reports that he has never smoked. He has never used smokeless tobacco. He reports that he does not drink alcohol or use drugs. Allergies  Allergies  Allergen Reactions  . Nsaids Other (See Comments)    Cannot take to due kidney issues  . Beta Adrenergic Blockers Other (See Comments)    "An unnamed, white-colored Beta Blocker made" him "feel funny" = made him feel cagey   Home medications Prior to Admission medications   Medication Sig Start Date End Date Taking? Authorizing Provider  acetaminophen (TYLENOL) 500 MG tablet Take 2 tablets (1,000 mg total) by mouth every 8 (eight) hours as needed. Patient taking differently: Take 1,000 mg every 8 (eight) hours as needed by mouth (for pain or headaches).  02/07/17 02/07/18 Yes Darreld Mclean, MD  allopurinol (ZYLOPRIM) 100 MG tablet TAKE 1 TABLET BY MOUTH TWO TIMES DAILY 09/19/17  Yes Darreld Mclean, MD  amiodarone (PACERONE) 200 MG tablet Take 1 tablet (200 mg total) by mouth daily. TAKE 2 TABLETS DAILY UNTIL 06/14/2017, and then resume 1 daily Patient taking differently: Take 200 mg by mouth daily.  06/27/17  Yes Darreld Mclean, MD  carvedilol (COREG) 6.25 MG tablet Take 1 tablet (6.25 mg total) 2 (two) times daily with a meal by mouth. 06/11/17  Yes Ginger Carne, MD  colchicine 0.6 MG tablet Take 0.6 mg 2 (two) times daily as needed by mouth (FOR GOUT FLARES). Reported on 01/18/2016   Yes [provider]  diclofenac sodium (VOLTAREN) 1 % GEL Apply 4 g topically 4 (four) times daily. 02/07/17  Yes Darreld Mclean, MD  ELIQUIS 5 MG TABS tablet TAKE ONE TABLET BY MOUTH TWICE DAILY 08/23/17  Yes Darreld Mclean, MD  isosorbide-hydrALAZINE (BIDIL) 20-37.5 MG tablet Take 1 tablet 3 (three) times daily by mouth. 06/11/17  Yes Ginger Carne, MD  metolazone (ZAROXOLYN) 2.5 MG tablet Take 1 tablet (2.5 mg total) by mouth once a week. Every Wednesday, take extra as directed 08/03/17  Yes Tillery, Mariam Dollar, PA-C  potassium chloride SA (KLOR-CON M20) 20 MEQ tablet Take 2 tablets (40 mEq total) by mouth 3 (three) times daily. 07/26/17  Yes Clegg, Amy D, NP  torsemide (DEMADEX) 20 MG tablet Take 5 tablets (100 mg total) by mouth 2 (two) times daily. 07/26/17  Yes Clegg, Amy D, NP   Liver Function Tests Recent Labs  Lab 11/03/17 1237  AST 36  ALT 22  ALKPHOS 155*  BILITOT 1.7*  PROT  7.1  ALBUMIN 3.7   No results for input(s): LIPASE, AMYLASE in the last 168 hours. CBC Recent Labs  Lab 11/03/17 1237  WBC 19.1*  NEUTROABS 15.0*  HGB 10.6*  HCT 35.3*  MCV 88.3  PLT 161   Basic Metabolic Panel Recent Labs  Lab 11/03/17 1237 11/04/17 0329  NA 139 139  K 3.7 4.1  CL 96* 97*  CO2 29 30  GLUCOSE 106* 114*  BUN 63* 67*  CREATININE 3.77* 4.00*  CALCIUM 8.9 9.0   Iron/TIBC/Ferritin/ %Sat    Component Value Date/Time   IRON 17 (L) 04/21/2014 0303   TIBC 274 04/21/2014 0303   FERRITIN 540 (H) 04/21/2014 0303   IRONPCTSAT 6 (L) 04/21/2014 0303    Vitals:   11/04/17 0736 11/04/17 1154 11/04/17 1300 11/04/17 1613  BP: 105/72 (!) 135/117 130/90 115/74  Pulse: 75 (!) 47  87  Resp: 16   (!) 22  Temp: 98.4 F (36.9 C) 98.3 F (36.8 C)    TempSrc: Oral Oral    SpO2: 99% (!) 89% 92% 96%  Weight:      Height:       Exam Gen alert, obese adult male in no distress, up in chair No rash, cyanosis or gangrene Sclera anicteric, throat clear  No jvd or bruits Chest mostly clear, occ crackles at bases RRR no MRG Abd soft ntnd no mass or ascites +bs obese GU normal male MS no joint effusions or deformity Ext firm 2-3+ bilat LE edema below the knees mostly, no wounds or ulcers Neuro  is alert, Ox 3 , nf  Home meds: - amio/ coreg 6.25 bid/ bidil tid - torsemide 100 bid/ zaroxolyn 2.5 weekly - eliquis 5 bid - colchicine/ voltaren gel/ allopurinol      Impression: 1  CKD stage IV - baseline creat 3.5- 4.2 as recently as Feb- Mar this year.  Here w/ vol overload, mild / mod pulm edema, marked LE edema, creat at baseline.  Didn't respond to IV lasix 40 or 80mg , switched to IV lasix gtt today at 10mg  /min.   2  Vol overload - acute on chronic, some of leg edema is prob d/t venous insufficiency.  +CHF on xray 3  HTN - on bidil, coreg. BP's ok.  4  Obesity 5  NICM/ sp ICD - EF 30-35% 6  AFib/ flutter - on amio/ BB/ eliquis   Rec - Will ^zaroxolyn to 5 mg daily.  If not responding to IV lasix gtt, would recommend next to try high dose IV lasix, 160 mg bolus every 6-8 hrs.  If not responding to this regimen , besides inotropes, the only other option would be dialysis. No indication for dialysis at this time.  Will follow.  Will have IV moved to R arm and save L arm.    Vinson Moselle MD [employer] pager [phone]   11/04/2017, 4:51 PM

## 2017-11-04 NOTE — Progress Notes (Signed)
Pt spent pretty much time sitting in a chair, IV lasix continue @10cc /hr, vitals stable, tylenol once provided for headache, diuresing not so well, will continue to monitor the patient  Lonia Farber, RN

## 2017-11-04 NOTE — Progress Notes (Signed)
Progress Note  Patient Name: Eric Murillo Date of Encounter: 11/04/2017  Primary Cardiologist: [name], MD   Subjective   He has not had any improvement in symptoms.  Very meager diuresis, still orthopneic.  Inpatient Medications    Scheduled Meds: . allopurinol  100 mg Oral BID  . amiodarone  200 mg Oral Daily  . apixaban  5 mg Oral BID  . carvedilol  6.25 mg Oral BID WC  . isosorbide-hydrALAZINE  1 tablet Oral TID  . metolazone  5 mg Oral Once  . potassium chloride SA  40 mEq Oral TID  . sodium chloride flush  3 mL Intravenous Q12H   Continuous Infusions: . sodium chloride    . furosemide (LASIX) infusion     PRN Meds: sodium chloride, acetaminophen, acetaminophen, HYDROcodone-homatropine, ondansetron (ZOFRAN) IV, sodium chloride flush   Vital Signs    Vitals:   11/03/17 2157 11/04/17 0428 11/04/17 0435 11/04/17 0736  BP: 92/61  119/83 105/72  Pulse:   66 75  Resp:   18 16  Temp:   97.6 F (36.4 C) 98.4 F (36.9 C)  TempSrc:   Oral Oral  SpO2:   94% 99%  Weight:  (!) 330 lb 9.6 oz (150 kg)    Height:        Intake/Output Summary (Last 24 hours) at 11/04/2017 1054 Last data filed at 11/04/2017 0825 Gross per 24 hour  Intake 896 ml  Output 1050 ml  Net -154 ml   Filed Weights   11/03/17 1406 11/03/17 1750 11/04/17 0428  Weight: (!) 332 lb 7.3 oz (150.8 kg) (!) 328 lb 12.8 oz (149.1 kg) (!) 330 lb 9.6 oz (150 kg)    Telemetry    Atrial flutter, rate controlled - Personally Reviewed  ECG    No new tracing - Personally Reviewed  Physical Exam  Morbidly obese GEN: No acute distress.   Neck:  JVD to jaw Cardiac: RRR, no murmurs, rubs, or gallops.  Respiratory: Clear to auscultation bilaterally. GI: Soft, nontender, non-distended  MS: symmetrical 3+ pitting edema to knees; No deformity. Neuro:  Nonfocal  Psych: Normal affect   Labs    Chemistry Recent Labs  Lab 11/03/17 1237 11/04/17 0329  NA 139 139  K 3.7 4.1  CL 96* 97*  CO2  29 30  GLUCOSE 106* 114*  BUN 63* 67*  CREATININE 3.77* 4.00*  CALCIUM 8.9 9.0  PROT 7.1  --   ALBUMIN 3.7  --   AST 36  --   ALT 22  --   ALKPHOS 155*  --   BILITOT 1.7*  --   GFRNONAA 16* 15*  GFRAA 19* 18*  ANIONGAP 14 12     Hematology Recent Labs  Lab 11/03/17 1237  WBC 19.1*  RBC 4.00*  HGB 10.6*  HCT 35.3*  MCV 88.3  MCH 26.5  MCHC 30.0  RDW 16.2*  PLT 161    Cardiac Enzymes Recent Labs  Lab 11/03/17 1237  TROPONINI 0.08*    Recent Labs  Lab 11/03/17 1301  TROPIPOC 0.09*     BNP Recent Labs  Lab 11/03/17 1238  BNP 1,160.1*     DDimer No results for input(s): DDIMER in the last 168 hours.   Radiology    Dg Chest 2 View  Result Date: 11/03/2017 CLINICAL DATA:  Shortness of breath and cough for couple weeks EXAM: CHEST - 2 VIEW COMPARISON:  06/04/2017 FINDINGS: LEFT subclavian transvenous AICD lead with tip projecting at RIGHT ventricle.  Enlargement of cardiac silhouette with pulmonary vascular congestion. Minimal interstitial infiltrates likely mild pulmonary edema. Mild RIGHT basilar atelectasis. No segmental consolidation, pleural effusion or pneumothorax. Bones unremarkable. IMPRESSION: Enlargement of cardiac silhouette with pulmonary vascular congestion and probable mild pulmonary edema, little changed. Electronically Signed   By: Ulyses Southward M.D.   On: 11/03/2017 13:00    Cardiac Studies   Echo 06/05/17: Study Conclusions  - Left ventricle: The cavity size was moderately dilated. Systolic function was severely reduced. The estimated ejection fraction was in the range of 20% to 25%. Diffuse hypokinesis. - Aortic valve: Transvalvular velocity was within the normal range. There was no stenosis. There was no regurgitation. - Mitral valve: Calcified annulus. Transvalvular velocity was within the normal range. There was no evidence for stenosis. There was moderate regurgitation. - Right ventricle: The cavity size was mildly  dilated. Wall thickness was normal. Systolic function was mildly reduced. - Right atrium: The atrium was severely dilated. - Atrial septum: A patent foramen ovale cannot be excluded. - Tricuspid valve: There was no regurgitation. - Pulmonary arteries: Systolic pressure was severely increased. PA peak pressure: 66 mm Hg (S).   Patient Profile     57 y.o. male with chronic atrial flutter, nonischemic cardiomyopathy, presents with acute on chronic combined systolic and diastolic heart failure and volume overload.  Exacerbation is at least partly related to dietary indiscretion.  Complicated by advanced stage IV chronic kidney disease, untreated OSA and morbid obesity.  Assessment & Plan    1. CHF: poor response to high dose IV bolus furosemide. Will start a continuous infusion and give a dose of metolazone. Reinforce importance of Na restriction and daily weight monitoring. 2. Ac on CKD4:  Hope to see an improvement with diuresis, but he is getting closer and closer to an indication for ESRD and renal replacement therapy. Will ask Nephrology to get involved. 3. Chronic atrial flutter: rate controlled, felt to be a poor candidate for RF ablation.  For questions or updates, please contact [facility] Please consult [email or web address] for contact info under Cardiology/STEMI.      Signed, Thurmon Fair, MD  11/04/2017, 10:54 AM

## 2017-11-04 NOTE — Progress Notes (Signed)
Placed IV team order for second IV to the right just to save the left arm if in case he needs to go to the process of HD, as per Nephrologist. But patient is refusing to switch IV this time, he does not want to do anything until he goes to his own kidney doctor per patient, RN is educating patient and helped him to solved his queries regarding his illness.  Lonia Farber, RN

## 2017-11-05 ENCOUNTER — Encounter: Payer: Medicaid Other | Admitting: Internal Medicine

## 2017-11-05 DIAGNOSIS — N179 Acute kidney failure, unspecified: Secondary | ICD-10-CM

## 2017-11-05 LAB — BASIC METABOLIC PANEL
Anion gap: 13 (ref 5–15)
BUN: 74 mg/dL — ABNORMAL HIGH (ref 6–20)
CALCIUM: 8.9 mg/dL (ref 8.9–10.3)
CHLORIDE: 95 mmol/L — AB (ref 101–111)
CO2: 29 mmol/L (ref 22–32)
CREATININE: 4.42 mg/dL — AB (ref 0.61–1.24)
GFR calc non Af Amer: 14 mL/min — ABNORMAL LOW (ref >60)
GFR, EST AFRICAN AMERICAN: 16 mL/min — AB (ref >60)
Glucose, Bld: 176 mg/dL — ABNORMAL HIGH (ref 65–99)
Potassium: 4.5 mmol/L (ref 3.5–5.1)
Sodium: 137 mmol/L (ref 135–145)

## 2017-11-05 NOTE — Progress Notes (Signed)
This Rn went in patients room for rounding. Patient was sitting in the recliner.I noticed that Lasix drip rate was changed to 12ml/hr from what was ordered.I just changed the rate form 25ml/hr to 58ml/hr,per order. Patient said it was Dr. Shirlee Latch who changed the rate, I told the patient" doctors don't do that, they placed orders in and the nurses implements the order". Clarified/verified orders in [facility], there is no changed in the existing order . I asked  Again " Did you noticed anybody who changed it"? Patient said  "No".  Patient is alert and oriented.  Rate changed back to 3ml/hr, IV pump locked by RN.

## 2017-11-05 NOTE — Discharge Instructions (Addendum)
Information on my medicine - ELIQUIS (apixaban)  Why was Eliquis prescribed for you? Eliquis was prescribed for you to reduce the risk of a blood clot forming that can cause a stroke if you have a medical condition called atrial fibrillation (a type of irregular heartbeat).  What do You need to know about Eliquis ? Take your Eliquis TWICE DAILY - one tablet in the morning and one tablet in the evening with or without food. If you have difficulty swallowing the tablet whole please discuss with your pharmacist how to take the medication safely.  Take Eliquis exactly as prescribed by your doctor and DO NOT stop taking Eliquis without talking to the doctor who prescribed the medication.  Stopping may increase your risk of developing a stroke.  Refill your prescription before you run out.  After discharge, you should have regular check-up appointments with your healthcare provider that is prescribing your Eliquis.  In the future your dose may need to be changed if your kidney function or weight changes by a significant amount or as you get older.  What do you do if you miss a dose? If you miss a dose, take it as soon as you remember on the same day and resume taking twice daily.  Do not take more than one dose of ELIQUIS at the same time to make up a missed dose.  Important Safety Information A possible side effect of Eliquis is bleeding. You should call your healthcare provider right away if you experience any of the following: ? Bleeding from an injury or your nose that does not stop. ? Unusual colored urine (red or dark brown) or unusual colored stools (red or black). ? Unusual bruising for unknown reasons. ? A serious fall or if you hit your head (even if there is no bleeding).  Some medicines may interact with Eliquis and might increase your risk of bleeding or clotting while on Eliquis. To help avoid this, consult your healthcare provider or pharmacist prior to using any new  prescription or non-prescription medications, including herbals, vitamins, non-steroidal anti-inflammatory drugs (NSAIDs) and supplements.  This website has more information on Eliquis (apixaban): http://www.eliquis.com/eliquis/home    [facility]  Discharge Instructions  AV Fistula or Graft Surgery for Dialysis Access  Please refer to the following instructions for your post-procedure care. Your surgeon or physician assistant will discuss any changes with you.  Activity  You may drive the day following your surgery, if you are comfortable and no longer taking prescription pain medication. Resume full activity as the soreness in your incision resolves.  Bathing/Showering  You may shower after you go home. Keep your incision dry for 48 hours. Do not soak in a bathtub, hot tub, or swim until the incision heals completely. You may not shower if you have a hemodialysis catheter.  Incision Care  Clean your incision with mild soap and water after 48 hours. Pat the area dry with a clean towel. You do not need a bandage unless otherwise instructed. Do not apply any ointments or creams to your incision. You may have skin glue on your incision. Do not peel it off. It will come off on its own in about one week. Your arm may swell a bit after surgery. To reduce swelling use pillows to elevate your arm so it is above your heart. Your doctor will tell you if you need to lightly wrap your arm with an ACE bandage.  Diet  Resume your normal diet. There  are not special food restrictions following this procedure. In order to heal from your surgery, it is CRITICAL to get adequate nutrition. Your body requires vitamins, minerals, and protein. Vegetables are the best source of vitamins and minerals. Vegetables also provide the perfect balance of protein. Processed food has little nutritional value, so try to avoid this.  Medications  Resume taking all of your medications.  If your incision is causing pain, you may take over-the counter pain relievers such as acetaminophen (Tylenol). If you were prescribed a stronger pain medication, please be aware these medications can cause nausea and constipation. Prevent nausea by taking the medication with a snack or meal. Avoid constipation by drinking plenty of fluids and eating foods with high amount of fiber, such as fruits, vegetables, and grains.  Do not take Tylenol if you are taking prescription pain medications.  Follow up Your surgeon may want to see you in the office following your access surgery. If so, this will be arranged at the time of your surgery.  Please call us immediately for any of the following conditions:  Increased pain, redness, drainage (pus) from your incision site Fever of 101 degrees or higher Severe or worsening pain at your incision site Hand pain or numbness.  Reduce your risk of vascular disease:  Stop smoking. If you would like help, call [facility] at 1-800-QUIT-NOW ((727)122-8841) or [place] at (910)186-6641  Manage your cholesterol Maintain a desired weight Control your diabetes Keep your blood pressure down  Dialysis  It will take several weeks to several months for your new dialysis access to be ready for use. Your surgeon will determine when it is okay to use it. Your nephrologist will continue to direct your dialysis. You can continue to use your Permcath until your new access is ready for use.   11/12/2017 Eric Murillo [id number] 10/01/1960  Surgeon(s): Fields, Janetta Hora, MD  Procedure(s): PLACEMENT of right internal jugualr tunneled DIALYSIS CATHETER with removal or temporary dialysis catheter CREATION of RIGHT BRACHIAL CEPHALIC ARTERIOVENOUS (AV) FISTULA  x Do not stick fistula for 12 weeks    If you have any questions, please call the office at [phone].

## 2017-11-05 NOTE — Progress Notes (Signed)
Orthopedic Tech Progress Note Patient Details:  Eric Murillo [birth year] [id number]  Ortho Devices Type of Ortho Device: Roland Rack boot Ortho Device/Splint Location: Bilateral unna boots Ortho Device/Splint Interventions: Application   Post Interventions Patient Tolerated: Well Instructions Provided: Care of device   Saul Fordyce 11/05/2017, 3:59 PM

## 2017-11-05 NOTE — Consult Note (Signed)
Advanced Heart Failure Team Consult Note   Primary Physician: No primary care provider on file. PCP-Cardiologist:  [name], MD  Reason for Consultation: Decompensated CHF  HPI:    Eric Murillo is seen today for evaluation of CHF at the request of Dr. Royann Shivers.   Mickael Mcnutt is a 57 y.o. male with history of morbid obesity, hyperlipidemia, HTN, atrial flutter (s/p TEE DC-CV 04/03/14 and again in 8/17 and 11/18), NICM with chronic systolic HF and severe OSA but unable to tolerate CPAP.    He was admitted in 04/2014 with increased exertional dyspnea and chest tightness.  RHC/LHC showed elevated filling pressures, preserved cardiac output, and no significant coronary disease.  He was diuresed and discharged home. TEE (9/15) with EF 25-30% with global hypokinesis, mild to moderate MR, mildly decreased RV systolic function.   He underwent work-up for cardiac amyloid. SPEP negative. Fat pad biopsy 6/16 with benign adipose tissue. Unable to get cMRI due to size.  Echo in 8/17 showed EF 20-25%, diffuse hypokinesis, PASP 48 mmHg.  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    He was admitted in 8/17 with acute on chronic systolic CHF in setting of atypical atrial flutter.  He was diuresed and had TEE-guided DCCV to NSR.  Amiodarone was begun.   Admitted 11/13 -> 06/11/17 with atypical Aflutter and underwent TEE/DCCV on 06/06/17.  Echo in 11/18 with EF 20-25%.    He was admitted again on 4/13 with increased dyspnea and weight gain. He was noted to be back in atrial flutter with rate control.  Creatinine has been rising with diuresis, up to 4.42.  He has not diuresed well this admission.  He is orthopneic and short of breath with exertion.  Nephrology saw yesterday.   CXR: Mild pulmonary edema.   Review of Systems: All systems reviewed and negative except as per HPI.   Home Medications Prior to Admission medications   Medication Sig Start Date End Date Taking? Authorizing Provider    acetaminophen (TYLENOL) 500 MG tablet Take 2 tablets (1,000 mg total) by mouth every 8 (eight) hours as needed. Patient taking differently: Take 1,000 mg every 8 (eight) hours as needed by mouth (for pain or headaches).  02/07/17 02/07/18 Yes Darreld Kirklin Mcduffee, MD  allopurinol (ZYLOPRIM) 100 MG tablet TAKE 1 TABLET BY MOUTH TWO TIMES DAILY 09/19/17  Yes Darreld Michell Kader, MD  amiodarone (PACERONE) 200 MG tablet Take 1 tablet (200 mg total) by mouth daily. TAKE 2 TABLETS DAILY UNTIL 06/14/2017, and then resume 1 daily Patient taking differently: Take 200 mg by mouth daily.  06/27/17  Yes Darreld Maciah Schweigert, MD  carvedilol (COREG) 6.25 MG tablet Take 1 tablet (6.25 mg total) 2 (two) times daily with a meal by mouth. 06/11/17  Yes Ginger Carne, MD  colchicine 0.6 MG tablet Take 0.6 mg 2 (two) times daily as needed by mouth (FOR GOUT FLARES). Reported on 01/18/2016   Yes [provider]  diclofenac sodium (VOLTAREN) 1 % GEL Apply 4 g topically 4 (four) times daily. 02/07/17  Yes Darreld Arshawn Valdez, MD  ELIQUIS 5 MG TABS tablet TAKE ONE TABLET BY MOUTH TWICE DAILY 08/23/17  Yes Darreld Maryalice Pasley, MD  isosorbide-hydrALAZINE (BIDIL) 20-37.5 MG tablet Take 1 tablet 3 (three) times daily by mouth. 06/11/17  Yes Ginger Carne, MD  metolazone (ZAROXOLYN) 2.5 MG tablet Take 1 tablet (2.5 mg total) by mouth once a week. Every Wednesday, take extra as directed 08/03/17  Yes Tillery, Mariam Dollar, PA-C  potassium chloride SA (  KLOR-CON M20) 20 MEQ tablet Take 2 tablets (40 mEq total) by mouth 3 (three) times daily. 07/26/17  Yes Clegg, Amy D, NP  torsemide (DEMADEX) 20 MG tablet Take 5 tablets (100 mg total) by mouth 2 (two) times daily. 07/26/17  Yes Clegg, Amy D, NP    Past Medical History: 1. Nonischemic cardiomyopathy: Echo (3/12) with EF 30-35% and mildly dilated LV, diffuse LV hypokinesis, moderate MR, PA systolic pressure 55 mmHg.  Left and right heart cath (3/12): No angiographic CAD; mean RA 20, PA 76/41, mean PCWP 38, CI 2.1.   ANA, SPEP, and HIV negative.  TSH normal.  Denies drug abuse, heavy ETOH intake.  No family history of cardiomyopathy.  Cardiomyopathy may be due to long-standing HTN.  Echo (6/13): EF 35-40%, moderate LV dilation, moderate to severe LAE, PA systolic pressure 52 mmHg.  Unable to fit in magnet for cardiac MRI.  Echo (8/14) with EF 45%, moderately dilated LV, mild LVH, normal RV, PA systolic pressure 49 mmHg. TEE (9/15) with EF 25-30% with global hypokinesis, mild to moderate MR, mildly decreased RV systolic function. LHC/RHC (10/15) with no significant coronary disease; mean RA 14, PA 65/25 mean 42, unable to obtain PCWP but LVEDP 24, CI 2.69.  Echo (6/16) with EF 30-35%, PA systolic pressure 62 mmHg, RV normal size and systolic function. [employer] ICD.  - Echo (8/17): EF 20-25%, mild LV dilation, diffuse hypokinesis, PASP 48 mmHg.  - Echo (11/18): EF 20-25%, moderate LV dilation, moderate MR, mildly decreased RV systolic function.  2. ERECTILE DYSFUNCTION 3. HYPERTENSION  4. CKD: Stage IV 5. DYSLIPIDEMIA  6. Obesity 7. Hyperlipidemia 8. Gout 9. OSA: Severe on 6/13 sleep study. Unable to use CPAP.  10. Sialolithiasis 11. Atypical Atrial Flutter: TEE-DC-CV (04/03/14) which was successful; TEE-DCCV in 8/17 was successful.  Successful DCCV again in 11/18.  Per Dr Elberta Fortis, not good candidate for ablation.  Now on amiodarone.   Past Surgical History: Past Surgical History:  Procedure Laterality Date  . CARDIAC CATHETERIZATION  2012  . CARDIOVERSION N/A 04/03/2014   Procedure: CARDIOVERSION;  Surgeon: Laurey Morale, MD;  Location: [facility] ENDOSCOPY;  Service: Cardiovascular;  Laterality: N/A;  . CARDIOVERSION N/A 03/09/2016   Procedure: CARDIOVERSION;  Surgeon: Laurey Morale, MD;  Location: [facility] ENDOSCOPY;  Service: Cardiovascular;  Laterality: N/A;  . CARDIOVERSION N/A 06/06/2017   Procedure: CARDIOVERSION;  Surgeon: Laurey Morale, MD;  Location: [facility] ENDOSCOPY;  Service: Cardiovascular;  Laterality: N/A;   . COLONOSCOPY    . EP IMPLANTABLE DEVICE N/A 05/21/2015   Procedure: ICD Implant;  Surgeon: Duke Salvia, MD;  Location: [facility] INVASIVE CV LAB;  Service: Cardiovascular;  Laterality: N/A;  . LEFT AND RIGHT HEART CATHETERIZATION WITH CORONARY ANGIOGRAM N/A 04/24/2014   Procedure: LEFT AND RIGHT HEART CATHETERIZATION WITH CORONARY ANGIOGRAM;  Surgeon: Laurey Morale, MD;  Location: [facility] CATH LAB;  Service: Cardiovascular;  Laterality: N/A;  . SALIVARY GLAND SURGERY  09/12/2012  . SUBMANDIBULAR GLAND EXCISION Right 09/12/2012   Procedure: Removal Right Submandibular Larina Bras;  Surgeon: Serena Colonel, MD;  Location: [facility] OR;  Service: ENT;  Laterality: Right;  . TEE WITHOUT CARDIOVERSION N/A 04/03/2014   Procedure: TRANSESOPHAGEAL ECHOCARDIOGRAM (TEE);  Surgeon: Laurey Morale, MD;  Location: [facility] ENDOSCOPY;  Service: Cardiovascular;  Laterality: N/A;  . TEE WITHOUT CARDIOVERSION N/A 03/09/2016   Procedure: TRANSESOPHAGEAL ECHOCARDIOGRAM (TEE);  Surgeon: Laurey Morale, MD;  Location: [facility] ENDOSCOPY;  Service: Cardiovascular;  Laterality: N/A;  . TEE WITHOUT CARDIOVERSION N/A 06/06/2017  Procedure: TRANSESOPHAGEAL ECHOCARDIOGRAM (TEE);  Surgeon: Laurey Morale, MD;  Location: [facility] ENDOSCOPY;  Service: Cardiovascular;  Laterality: N/A;    Family History: Family History  Problem Relation Age of Onset  . Hypertension Mother   . Hypertension Father   . Cancer Father        brother died of brain cancer; sister died of bone cancer  . Diabetes Sister   . Diabetes Brother   . Colon cancer Maternal Aunt   . Cardiomyopathy Neg Hx     Social History: Social History   Socioeconomic History  . Marital status: Single    Spouse name: Not on file  . Number of children: Not on file  . Years of education: Not on file  . Highest education level: Not on file  Occupational History  . Not on file  Social Needs  . Financial resource strain: Not on file  . Food insecurity:    Worry: Not on file    Inability: Not  on file  . Transportation needs:    Medical: Not on file    Non-medical: Not on file  Tobacco Use  . Smoking status: Never Smoker  . Smokeless tobacco: Never Used  Substance and Sexual Activity  . Alcohol use: No    Alcohol/week: 0.0 oz  . Drug use: No  . Sexual activity: Not on file  Lifestyle  . Physical activity:    Days per week: Not on file    Minutes per session: Not on file  . Stress: Not on file  Relationships  . Social connections:    Talks on phone: Not on file    Gets together: Not on file    Attends religious service: Not on file    Active member of club or organization: Not on file    Attends meetings of clubs or organizations: Not on file    Relationship status: Not on file  Other Topics Concern  . Not on file  Social History Narrative   Financial assistance approved for 100% discount at [facility] and has [facility] card   [employer]  March 16, 2010 9:42 AM      Lives with mother.  Has a girlfriend    Allergies:  Allergies  Allergen Reactions  . Nsaids Other (See Comments)    Cannot take to due kidney issues  . Beta Adrenergic Blockers Other (See Comments)    "An unnamed, white-colored Beta Blocker made" him "feel funny" = made him feel cagey    Objective:    Vital Signs:   Temp:  [98.2 F (36.8 C)-98.5 F (36.9 C)] 98.5 F (36.9 C) (04/15 0454) Pulse Rate:  [47-87] 77 (04/15 0955) Resp:  [16-22] 16 (04/15 0955) BP: (113-135)/(62-117) 113/96 (04/15 0955) SpO2:  [89 %-98 %] 97 % (04/15 0955) Weight:  [322 lb 12.8 oz (146.4 kg)] 322 lb 12.8 oz (146.4 kg) (04/15 0454) Last BM Date: 11/02/17  Weight change: Filed Weights   11/03/17 1750 11/04/17 0428 11/05/17 0454  Weight: (!) 328 lb 12.8 oz (149.1 kg) (!) 330 lb 9.6 oz (150 kg) (!) 322 lb 12.8 oz (146.4 kg)    Intake/Output:   Intake/Output Summary (Last 24 hours) at 11/05/2017 1032 Last data filed at 11/05/2017 0900 Gross per 24 hour  Intake 961.17 ml  Output 1470 ml  Net -508.83 ml       Physical Exam    General:  Obese, NAD HEENT: normal Neck: Thick. JVP 16 cm. Carotids 2+ bilat; no bruits. No lymphadenopathy  or thyromegaly appreciated. Cor: PMI nonpalpable. Irregular rate & rhythm. No rubs, gallops or murmurs. Lungs: Crackles at bases.  Abdomen: soft, nontender, nondistended. No hepatosplenomegaly. No bruits or masses. Good bowel sounds. Extremities: no cyanosis, clubbing, rash.  1+ edema to thighs.  Neuro: alert & orientedx3, cranial nerves grossly intact. moves all 4 extremities w/o difficulty. Affect pleasant   Telemetry   Difficult to tell if NSR or atrial flutter with low voltage.   EKG    Atypical atrial flutter, low voltage (personally reviewed)  Labs   Basic Metabolic Panel: Recent Labs  Lab 11/03/17 1237 11/04/17 0329 11/05/17 0530  NA 139 139 137  K 3.7 4.1 4.5  CL 96* 97* 95*  CO2 29 30 29   GLUCOSE 106* 114* 176*  BUN 63* 67* 74*  CREATININE 3.77* 4.00* 4.42*  CALCIUM 8.9 9.0 8.9    Liver Function Tests: Recent Labs  Lab 11/03/17 1237  AST 36  ALT 22  ALKPHOS 155*  BILITOT 1.7*  PROT 7.1  ALBUMIN 3.7   No results for input(s): LIPASE, AMYLASE in the last 168 hours. No results for input(s): AMMONIA in the last 168 hours.  CBC: Recent Labs  Lab 11/03/17 1237  WBC 19.1*  NEUTROABS 15.0*  HGB 10.6*  HCT 35.3*  MCV 88.3  PLT 161    Cardiac Enzymes: Recent Labs  Lab 11/03/17 1237  TROPONINI 0.08*    BNP: BNP (last 3 results) Recent Labs    06/05/17 0301 07/26/17 0916 11/03/17 1238  BNP 672.6* 399.7* 1,160.1*    ProBNP (last 3 results) No results for input(s): PROBNP in the last 8760 hours.   CBG: No results for input(s): GLUCAP in the last 168 hours.  Coagulation Studies: No results for input(s): LABPROT, INR in the last 72 hours.   Imaging    No results found.   Medications:     Current Medications: . allopurinol  100 mg Oral BID  . amiodarone  200 mg Oral Daily  . apixaban  5 mg  Oral BID  . carvedilol  6.25 mg Oral BID WC  . isosorbide-hydrALAZINE  1 tablet Oral TID  . metolazone  5 mg Oral Daily  . potassium chloride SA  40 mEq Oral TID  . sodium chloride flush  3 mL Intravenous Q12H     Infusions: . sodium chloride    . furosemide (LASIX) infusion 10 mg/hr (11/04/17 1153)       Assessment/Plan   1. Acute on chronic systolic CHF: Nonischemic cardiomyopathy x years. Last echo in 11/18 with moderate LV dilation, EF 20-25%, mildly decreased RV systolic function.  He had a workup for cardiac amyloidosis in the past.  However, negative SPEP and abdominal fat pad biopsy negative. Low voltage on ECG may be due to obesity and not amyloidosis. CKD stage IV makes management of volume difficult. He remains volume overloaded on exam, sluggish diuresis so far with rise in creatinine.  - Increase Lasix gtt to 20 mg/hr and continue metolazone 5 mg daily.  - Continue current Coreg and Bidil doses.  - No ACEI/ARB/ARNI/spironolactone with CKD IV.  - It is possible that he will end up needing HD for volume management.  He has not had extensive discussions about this.  2. Atypical atrial flutter: Recurrent. Most recent DCCV in 11/18.  Saw Dr Elberta Fortis, not thought to be a good ablation candidate.  Has been on amiodarone 200 daily.  - Continue amiodarone and apixaban.  He has not missed apixaban doses.  -  Once we have diuresed him some, will attempt DCCV again.  3. AKI on CKD stage IV: Creatinine rising this admission.  I am concerned that he is going to end up needing HD for volume management.  Renal is following.  It is possible that lowering of renal venous pressure with diuresis may help if we can get him to make more urine.  4. OSA: Has been unable to tolerate CPAP.  5. Morbid obesity Length of Stay: 1  [name], MD  11/05/2017, 10:32 AM  Advanced Heart Failure Team Pager [phone] (M-F; 7a - 4p)  Please contact [facility] for night-coverage after hours (4p -7a )  and weekends on [email or web address]

## 2017-11-05 NOTE — Progress Notes (Signed)
  [facility] Progress Note    Assessment/ Plan:   1  CKD stage IV - baseline creat 3.5- 4.2 as recently as Feb- Mar this year.  He is having some response to Lasix gtt but not as good as we would like.  Lasix gtt increased to 20 mg/ hr with metolazone.  Hopefully he won't need HD during this admission but it remains to be seen.  Discussed HD with pt today- he would do it "if push came to shove." 2  Vol overload - acute on chronic, some of leg edema is prob d/t venous insufficiency.  +CHF on xray 3  HTN - on bidil, coreg. BP's ok.  4  Obesity 5  NICM/ sp ICD - EF 30-35% 6  AFib/ flutter - on amio/ BB/ eliquis  Subjective:    Lasix gtt increased to 20 mg/ hr today with metolazone.  Some diuresis, but still a long way to go.  Pt reports he feels a little better than yesterday.   Objective:   BP (!) 113/96 (BP Location: Left Arm)   Pulse 77   Temp 98.5 F (36.9 C) (Oral)   Resp 16   Ht 5' 9.5" (1.765 m)   Wt (!) 146.4 kg (322 lb 12.8 oz)   SpO2 97%   BMI 46.99 kg/m   Intake/Output Summary (Last 24 hours) at 11/05/2017 1130 Last data filed at 11/05/2017 0900 Gross per 24 hour  Intake 961.17 ml  Output 1470 ml  Net -508.83 ml   Weight change: 1.724 kg (3 lb 12.8 oz)  Physical Exam: Gen: obese, sitting in chair HEENT: + JVD CVS: RRR soft systolic murmur Resp: diminished bilateral bases Abd: obese Ext: 3+ LE edema  Imaging: Dg Chest 2 View  Result Date: 11/03/2017 CLINICAL DATA:  Shortness of breath and cough for couple weeks EXAM: CHEST - 2 VIEW COMPARISON:  06/04/2017 FINDINGS: LEFT subclavian transvenous AICD lead with tip projecting at RIGHT ventricle. Enlargement of cardiac silhouette with pulmonary vascular congestion. Minimal interstitial infiltrates likely mild pulmonary edema. Mild RIGHT basilar atelectasis. No segmental consolidation, pleural effusion or pneumothorax. Bones unremarkable. IMPRESSION: Enlargement of cardiac silhouette with pulmonary  vascular congestion and probable mild pulmonary edema, little changed. Electronically Signed   By: Ulyses Southward M.D.   On: 11/03/2017 13:00    Labs: BMET Recent Labs  Lab 11/03/17 1237 11/04/17 0329 11/05/17 0530  NA 139 139 137  K 3.7 4.1 4.5  CL 96* 97* 95*  CO2 29 30 29   GLUCOSE 106* 114* 176*  BUN 63* 67* 74*  CREATININE 3.77* 4.00* 4.42*  CALCIUM 8.9 9.0 8.9   CBC Recent Labs  Lab 11/03/17 1237  WBC 19.1*  NEUTROABS 15.0*  HGB 10.6*  HCT 35.3*  MCV 88.3  PLT 161    Medications:    . allopurinol  100 mg Oral BID  . amiodarone  200 mg Oral Daily  . apixaban  5 mg Oral BID  . carvedilol  6.25 mg Oral BID WC  . isosorbide-hydrALAZINE  1 tablet Oral TID  . metolazone  5 mg Oral Daily  . potassium chloride SA  40 mEq Oral TID  . sodium chloride flush  3 mL Intravenous Q12H      Bufford Buttner, MD [facility] pgr [phone] 11/05/2017, 11:30 AM

## 2017-11-06 ENCOUNTER — Other Ambulatory Visit: Payer: Self-pay | Admitting: Internal Medicine

## 2017-11-06 ENCOUNTER — Encounter: Payer: Self-pay | Admitting: Internal Medicine

## 2017-11-06 LAB — CBC WITH DIFFERENTIAL/PLATELET
BASOS ABS: 0 10*3/uL (ref 0.0–0.1)
Basophils Relative: 0 %
Eosinophils Absolute: 0.1 10*3/uL (ref 0.0–0.7)
Eosinophils Relative: 1 %
HEMATOCRIT: 35.4 % — AB (ref 39.0–52.0)
Hemoglobin: 10.7 g/dL — ABNORMAL LOW (ref 13.0–17.0)
LYMPHS PCT: 16 %
Lymphs Abs: 2.1 10*3/uL (ref 0.7–4.0)
MCH: 26.8 pg (ref 26.0–34.0)
MCHC: 30.2 g/dL (ref 30.0–36.0)
MCV: 88.7 fL (ref 78.0–100.0)
MONOS PCT: 5 %
Monocytes Absolute: 0.7 10*3/uL (ref 0.1–1.0)
NEUTROS ABS: 10.1 10*3/uL — AB (ref 1.7–7.7)
Neutrophils Relative %: 78 %
Platelets: 216 10*3/uL (ref 150–400)
RBC: 3.99 MIL/uL — ABNORMAL LOW (ref 4.22–5.81)
RDW: 16.3 % — AB (ref 11.5–15.5)
WBC: 13 10*3/uL — ABNORMAL HIGH (ref 4.0–10.5)

## 2017-11-06 LAB — BASIC METABOLIC PANEL
ANION GAP: 13 (ref 5–15)
BUN: 87 mg/dL — ABNORMAL HIGH (ref 6–20)
CHLORIDE: 98 mmol/L — AB (ref 101–111)
CO2: 27 mmol/L (ref 22–32)
Calcium: 9.1 mg/dL (ref 8.9–10.3)
Creatinine, Ser: 5.13 mg/dL — ABNORMAL HIGH (ref 0.61–1.24)
GFR calc Af Amer: 13 mL/min — ABNORMAL LOW (ref >60)
GFR, EST NON AFRICAN AMERICAN: 11 mL/min — AB (ref >60)
Glucose, Bld: 124 mg/dL — ABNORMAL HIGH (ref 65–99)
POTASSIUM: 4.9 mmol/L (ref 3.5–5.1)
Sodium: 138 mmol/L (ref 135–145)

## 2017-11-06 MED ORDER — METOLAZONE 5 MG PO TABS
5.0000 mg | ORAL_TABLET | Freq: Two times a day (BID) | ORAL | Status: DC
  Administered 2017-11-06 – 2017-11-08 (×5): 5 mg via ORAL
  Filled 2017-11-06 (×5): qty 1

## 2017-11-06 NOTE — Plan of Care (Signed)
  Problem: Activity: Goal: Risk for activity intolerance will decrease Outcome: Progressing   Problem: Coping: Goal: Level of anxiety will decrease Outcome: Progressing   Problem: Pain Managment: Goal: General experience of comfort will improve Outcome: Progressing   Problem: Education: Goal: Ability to demonstrate management of disease process will improve Outcome: Progressing

## 2017-11-06 NOTE — Progress Notes (Signed)
Patient ID: Eric Murillo, male   DOB: 10-02-1960, 57 y.o.   MRN: [id number]     Advanced Heart Failure Rounding Note  PCP-Cardiologist: [name], MD   Subjective:    Still with dyspnea.  Not much UOP recorded though patient says that he thinks he urinated a lot.  Weights do not appear accurate.    Objective:   Weight Range: (!) 333 lb 3.2 oz (151.1 kg) Body mass index is 48.5 kg/m.   Vital Signs:   Temp:  [97.7 F (36.5 C)] 97.7 F (36.5 C) (04/16 0455) Pulse Rate:  [71-100] 100 (04/16 0825) Resp:  [16-18] 16 (04/16 0825) BP: (110-120)/(82-96) 110/85 (04/16 0825) SpO2:  [94 %-98 %] 98 % (04/16 0825) Weight:  [333 lb 3.2 oz (151.1 kg)] 333 lb 3.2 oz (151.1 kg) (04/16 0455) Last BM Date: 11/02/17  Weight change: Filed Weights   11/04/17 0428 11/05/17 0454 11/06/17 0455  Weight: (!) 330 lb 9.6 oz (150 kg) (!) 322 lb 12.8 oz (146.4 kg) (!) 333 lb 3.2 oz (151.1 kg)    Intake/Output:   Intake/Output Summary (Last 24 hours) at 11/06/2017 0847 Last data filed at 11/06/2017 0800 Gross per 24 hour  Intake 1133.83 ml  Output 1650 ml  Net -516.17 ml      Physical Exam    General:  Well appearing. No resp difficulty HEENT: Normal Neck: Supple. JVP 14-16 cm. Carotids 2+ bilat; no bruits. No lymphadenopathy or thyromegaly appreciated. Cor: PMI nonpalpable. Regular rate & rhythm. No rubs, gallops or murmurs. Lungs: Decreased breath sounds at bases Abdomen: Soft, nontender, nondistended. No hepatosplenomegaly. No bruits or masses. Good bowel sounds. Extremities: No cyanosis, clubbing, rash.  Legs wrapped, at least 1+ edema into thighs.  Neuro: Alert & orientedx3, cranial nerves grossly intact. moves all 4 extremities w/o difficulty. Affect pleasant   Telemetry   NSR in 50s-60s  Labs    CBC Recent Labs    11/03/17 1237 11/06/17 0713  WBC 19.1* 13.0*  NEUTROABS 15.0* 10.1*  HGB 10.6* 10.7*  HCT 35.3* 35.4*  MCV 88.3 88.7  PLT 161 216   Basic Metabolic  Panel Recent Labs    11/05/17 0530 11/06/17 0713  NA 137 138  K 4.5 4.9  CL 95* 98*  CO2 29 27  GLUCOSE 176* 124*  BUN 74* 87*  CREATININE 4.42* 5.13*  CALCIUM 8.9 9.1   Liver Function Tests Recent Labs    11/03/17 1237  AST 36  ALT 22  ALKPHOS 155*  BILITOT 1.7*  PROT 7.1  ALBUMIN 3.7   No results for input(s): LIPASE, AMYLASE in the last 72 hours. Cardiac Enzymes Recent Labs    11/03/17 1237  TROPONINI 0.08*    BNP: BNP (last 3 results) Recent Labs    06/05/17 0301 07/26/17 0916 11/03/17 1238  BNP 672.6* 399.7* 1,160.1*    ProBNP (last 3 results) No results for input(s): PROBNP in the last 8760 hours.   D-Dimer No results for input(s): DDIMER in the last 72 hours. Hemoglobin A1C No results for input(s): HGBA1C in the last 72 hours. Fasting Lipid Panel No results for input(s): CHOL, HDL, LDLCALC, TRIG, CHOLHDL, LDLDIRECT in the last 72 hours. Thyroid Function Tests No results for input(s): TSH, T4TOTAL, T3FREE, THYROIDAB in the last 72 hours.  Invalid input(s): FREET3  Other results:   Imaging     No results found.   Medications:     Scheduled Medications: . allopurinol  100 mg Oral BID  . amiodarone  200  mg Oral Daily  . apixaban  5 mg Oral BID  . carvedilol  6.25 mg Oral BID WC  . isosorbide-hydrALAZINE  1 tablet Oral TID  . metolazone  5 mg Oral BID  . potassium chloride SA  40 mEq Oral TID  . sodium chloride flush  3 mL Intravenous Q12H     Infusions: . sodium chloride    . furosemide (LASIX) infusion 20 mg/hr (11/06/17 0241)     PRN Medications:  sodium chloride, acetaminophen, acetaminophen, HYDROcodone-homatropine, ondansetron (ZOFRAN) IV, sodium chloride flush  Assessment/Plan   1. Acute on chronic systolic CHF: Nonischemic cardiomyopathy x years. Last echo in 11/18 with moderate LV dilation, EF 20-25%, mildly decreased RV systolic function.  He had a workup for cardiac amyloidosis in the past.  However, negative  SPEP and abdominal fat pad biopsy negative. Low voltage on ECG may be due to obesity and not amyloidosis. CKD stage IV makes management of volume difficult. He remains quite volume overloaded on exam, sluggish diuresis so far with rising creatinine. He says he urinated a lot yesterday but not reflective in I/Os.  - Continue Lasix gtt 20 mg/hr and increase metolazone to 5 mg bid.  - Continue current Coreg and Bidil doses.  BP is stable.  - No ACEI/ARB/ARNI/spironolactone with CKD IV.  - It is looking like he will end up needing HD for volume management.   2. Atypical atrial flutter: Recurrent. Most recent DCCV in 11/18.  Saw Dr Elberta Fortis, not thought to be a good ablation candidate.  Has been on amiodarone 200 daily.  He is back in NSR today, will not need cardioversion.   - Continue amiodarone and apixaban.  He has not missed apixaban doses.  3. AKI on CKD stage IV: Creatinine rising this admission.  I suspect that he is going to end up needing HD for volume management.  Renal is following.   4. OSA: Has been unable to tolerate CPAP.  5. Morbid obesity  Length of Stay: 2  [name], MD  11/06/2017, 8:47 AM  Advanced Heart Failure Team Pager [phone] (M-F; 7a - 4p)  Please contact [facility] for night-coverage after hours (4p -7a ) and weekends on [email or web address]

## 2017-11-06 NOTE — Progress Notes (Signed)
  [facility] Progress Note    Assessment/ Plan:   1  CKD stage IV - baseline creat 3.5- 4.2 as recently as Feb- Mar this year.  Not as big a response to lasix as hoped.  Will likely need dialysis for vol removal- discussed that this is likely the sentinel event that will push him over to ESRD.  Vein mapping ordered and will c/s VVS when complete., 2  Vol overload - acute on chronic, some of leg edema is prob d/t venous insufficiency.  +CHF on xray 3  HTN - on bidil, coreg. BP's ok.  4  Obesity 5  NICM/ sp ICD - EF 30-35% 6  AFib/ flutter - on amio/ BB/ eliquis  Subjective:    Cr continues to climb.  Not as robust a diuresis as expected.  I had a long talk with the pt today- I think he will likely require dialysis.  He is understandably shocked but appears willing to proceed.   Objective:   BP 110/69 (BP Location: Left Arm)   Pulse 65   Temp 97.9 F (36.6 C) (Oral)   Resp 18   Ht 5' 9.5" (1.765 m)   Wt (!) 151.1 kg (333 lb 3.2 oz) Comment: scale c  SpO2 99%   BMI 48.50 kg/m   Intake/Output Summary (Last 24 hours) at 11/06/2017 1315 Last data filed at 11/06/2017 1108 Gross per 24 hour  Intake 1113.83 ml  Output 1775 ml  Net -661.17 ml   Weight change: 4.717 kg (10 lb 6.4 oz)  Physical Exam: Gen: obese, leaning over the sink HEENT:+ JVD CVS: RRR soft systolic murmur Resp: diminished bilateral bases Abd: obese Ext: 3+ LE edema, largely unchanged  Imaging: No results found.  Labs: BMET Recent Labs  Lab 11/03/17 1237 11/04/17 0329 11/05/17 0530 11/06/17 0713  NA 139 139 137 138  K 3.7 4.1 4.5 4.9  CL 96* 97* 95* 98*  CO2 29 30 29 27   GLUCOSE 106* 114* 176* 124*  BUN 63* 67* 74* 87*  CREATININE 3.77* 4.00* 4.42* 5.13*  CALCIUM 8.9 9.0 8.9 9.1   CBC Recent Labs  Lab 11/03/17 1237 11/06/17 0713  WBC 19.1* 13.0*  NEUTROABS 15.0* 10.1*  HGB 10.6* 10.7*  HCT 35.3* 35.4*  MCV 88.3 88.7  PLT 161 216    Medications:    . allopurinol  100  mg Oral BID  . amiodarone  200 mg Oral Daily  . apixaban  5 mg Oral BID  . carvedilol  6.25 mg Oral BID WC  . isosorbide-hydrALAZINE  1 tablet Oral TID  . metolazone  5 mg Oral BID  . potassium chloride SA  40 mEq Oral TID  . sodium chloride flush  3 mL Intravenous Q12H      Bufford Buttner, MD [facility] pgr [phone] 11/06/2017, 1:15 PM

## 2017-11-07 ENCOUNTER — Inpatient Hospital Stay (HOSPITAL_COMMUNITY): Payer: Medicaid Other

## 2017-11-07 DIAGNOSIS — Z01810 Encounter for preprocedural cardiovascular examination: Secondary | ICD-10-CM

## 2017-11-07 LAB — CBC WITH DIFFERENTIAL/PLATELET
BASOS ABS: 0 10*3/uL (ref 0.0–0.1)
Basophils Relative: 0 %
EOS PCT: 1 %
Eosinophils Absolute: 0.1 10*3/uL (ref 0.0–0.7)
HCT: 35.7 % — ABNORMAL LOW (ref 39.0–52.0)
Hemoglobin: 10.8 g/dL — ABNORMAL LOW (ref 13.0–17.0)
LYMPHS PCT: 12 %
Lymphs Abs: 1.4 10*3/uL (ref 0.7–4.0)
MCH: 26.5 pg (ref 26.0–34.0)
MCHC: 30.3 g/dL (ref 30.0–36.0)
MCV: 87.5 fL (ref 78.0–100.0)
MONOS PCT: 16 %
Monocytes Absolute: 1.8 10*3/uL — ABNORMAL HIGH (ref 0.1–1.0)
Neutro Abs: 8.4 10*3/uL — ABNORMAL HIGH (ref 1.7–7.7)
Neutrophils Relative %: 71 %
PLATELETS: 224 10*3/uL (ref 150–400)
RBC: 4.08 MIL/uL — ABNORMAL LOW (ref 4.22–5.81)
RDW: 16.3 % — AB (ref 11.5–15.5)
WBC: 11.7 10*3/uL — ABNORMAL HIGH (ref 4.0–10.5)

## 2017-11-07 LAB — BASIC METABOLIC PANEL
Anion gap: 12 (ref 5–15)
BUN: 98 mg/dL — ABNORMAL HIGH (ref 6–20)
CALCIUM: 9 mg/dL (ref 8.9–10.3)
CO2: 26 mmol/L (ref 22–32)
Chloride: 98 mmol/L — ABNORMAL LOW (ref 101–111)
Creatinine, Ser: 5.31 mg/dL — ABNORMAL HIGH (ref 0.61–1.24)
GFR calc Af Amer: 13 mL/min — ABNORMAL LOW (ref >60)
GFR, EST NON AFRICAN AMERICAN: 11 mL/min — AB (ref >60)
GLUCOSE: 117 mg/dL — AB (ref 65–99)
POTASSIUM: 4.5 mmol/L (ref 3.5–5.1)
Sodium: 136 mmol/L (ref 135–145)

## 2017-11-07 NOTE — Progress Notes (Signed)
Upper Extremity Vein Map   Right Cephalic  Segment Diameter Depth Comment  1. Axilla 3.28mm 8.64mm   2. Mid upper arm 4.80mm 5.15mm   3. Above [facility] 6.27mm 2.81mm   4. In AC mm mm   5. Below AC 4.80mm 2.6mm   6. Mid forearm 4.66mm 2.103mm   7. Distal 3.47mm 3.62mm    mm mm    mm mm    mm mm    Right Basilic  --Not visualized.    Left Cephalic  Segment Diameter Depth Comment  1. Axilla 2.54mm 8.57   2. Mid upper arm 2.26mm 4.46mm   3. Above [facility] 1.65mm 4.22mm branching  4. In [facility] 4.63mm 2.51mm thrombosed  5. Below AC 4.52mm 2.43mm   6. Mid forearm 2.60mm 3.7mm   7. Distal 3.38mm 3.79mm    mm mm    mm mm    mm mm    Left Basilic  Segment Diameter Depth Comment  1. Axilla mm mm Not visualized  2. Mid upper arm 5.73mm 20.25mm   3. Above [facility] 3.64mm 9.46mm Branching  4. In [facility] 3.62mm 6.1mm   5. Below AC 3.17mm 1.28mm   6. Mid forearm 2.65mm 3.67mm   7. Distal 1.2mm 3.47mm    mm mm    mm mm    mm mm    [place] Brentley Horrell (RDMS RVT) 11/07/17 4:10 PM

## 2017-11-07 NOTE — Progress Notes (Signed)
  [facility] Progress Note    Assessment/ Plan:    1  AKI on CKD stage IV--> ESRD - baseline creat 3.5- 4.2 as recently as Feb- Mar this year.  Not as big a response to lasix as hoped.  Preparing for dialysis.  Had several long talks with pt.  Willing to proceed.  Vein mapping today, will c/s VVS when complete.  2  Vol overload - acute on chronic, some of leg edema is prob d/t venous insufficiency.  +CHF on xray  3  HTN - on bidil, coreg. BP's ok.   4  Obesity  5  NICM/ sp ICD - EF 30-35%  6  AFib/ flutter - on amio/ BB/ eliquis  Subjective:    For vein mapping today.  Tells me he fell asleep on the edge of the bed and fell off??     Objective:   BP (!) 144/60 (BP Location: Right Arm)   Pulse 67   Temp (!) 97.5 F (36.4 C) (Oral)   Resp 18   Ht 5' 9.5" (1.765 m)   Wt (!) 150.4 kg (331 lb 9.6 oz) Comment: scale c  SpO2 100%   BMI 48.27 kg/m   Intake/Output Summary (Last 24 hours) at 11/07/2017 1515 Last data filed at 11/07/2017 1246 Gross per 24 hour  Intake 1640 ml  Output 2510 ml  Net -870 ml   Weight change: -0.726 kg (-1 lb 9.6 oz)  Physical Exam: Gen: obese, sitting on chair, appears uncomfortable HEENT:+ JVD to angle of mandible upright- not improved CVS: RRR soft systolic murmur Resp: diminished bilateral bases Abd: obese Ext: 3+ LE edema, largely unchanged, unna boots  Imaging: No results found.  Labs: BMET Recent Labs  Lab 11/03/17 1237 11/04/17 0329 11/05/17 0530 11/06/17 0713 11/07/17 0502  NA 139 139 137 138 136  K 3.7 4.1 4.5 4.9 4.5  CL 96* 97* 95* 98* 98*  CO2 29 30 29 27 26   GLUCOSE 106* 114* 176* 124* 117*  BUN 63* 67* 74* 87* 98*  CREATININE 3.77* 4.00* 4.42* 5.13* 5.31*  CALCIUM 8.9 9.0 8.9 9.1 9.0   CBC Recent Labs  Lab 11/03/17 1237 11/06/17 0713 11/07/17 0502  WBC 19.1* 13.0* 11.7*  NEUTROABS 15.0* 10.1* 8.4*  HGB 10.6* 10.7* 10.8*  HCT 35.3* 35.4* 35.7*  MCV 88.3 88.7 87.5  PLT 161 216 224     Medications:    . allopurinol  100 mg Oral BID  . amiodarone  200 mg Oral Daily  . apixaban  5 mg Oral BID  . carvedilol  6.25 mg Oral BID WC  . isosorbide-hydrALAZINE  1 tablet Oral TID  . metolazone  5 mg Oral BID  . potassium chloride SA  40 mEq Oral TID  . sodium chloride flush  3 mL Intravenous Q12H      Bufford Buttner, MD [facility] pgr [phone] 11/07/2017, 3:15 PM

## 2017-11-07 NOTE — Evaluation (Signed)
Physical Therapy Evaluation Patient Details Name: Eric Murillo MRN: [id number] DOB: 09/25/1960 Today's Date: 11/07/2017   History of Present Illness  57yo male who presented to the ED with cough and increased SOB, found to be volume overloaded and in A-flutter with controlled ventricular rate in the ED. Note hx of TEE/cardioversion in 2015, 2017, and 2018. Diagnosed with acute on chronic heart failure, A-flutter. PMH AICD placement, OA, A-flutter, CHF, CKD, gout, HTN, morbid obesity, hx cardiac cath and cardioversion, hx TEE   Clinical Impression   Patient received up in chair, pleasant and willing to participate with skilled PT services today. He is able to perform functional transfers and gait in the room with Min guard and no device, mildly unsteady and with wide BOS but able to maintain balance without additional assistance from PT today. He declines going out of room into hallway due to malaise this afternoon. He was left sitting up in the chair with all needs met, and will continue to benefit from skilled PT services in the acute setting as well as skilled OP PT services moving forward.     Follow Up Recommendations Outpatient PT    Equipment Recommendations  None recommended by PT    Recommendations for Other Services       Precautions / Restrictions Precautions Precautions: Fall Restrictions Weight Bearing Restrictions: No      Mobility  Bed Mobility               General bed mobility comments: DNT, received sitting up in chair   Transfers Overall transfer level: Needs assistance Equipment used: None Transfers: Sit to/from Stand Sit to Stand: Min guard         General transfer comment: Min guard for safety, extended time and needed to stay in static standing for 1-2 minutes due to dizziness   Ambulation/Gait Ambulation/Gait assistance: Min guard Ambulation Distance (Feet): 20 Feet Assistive device: None Gait Pattern/deviations: Step-through  pattern;Trendelenburg;Drifts right/left;Wide base of support     General Gait Details: patient requesting to stay in the room today, able to ambulate with slow gait pattern in room with Min guard for safety and only mild balance deficit   Stairs            Wheelchair Mobility    Modified Rankin (Stroke Patients Only)       Balance Overall balance assessment: Needs assistance Sitting-balance support: Feet supported Sitting balance-Leahy Scale: Good     Standing balance support: No upper extremity supported;During functional activity Standing balance-Leahy Scale: Fair Standing balance comment: mild gross unsteadiness with mobility, able to self correct without increased assist from PT                              Pertinent Vitals/Pain Pain Assessment: No/denies pain    Home Living Family/patient expects to be discharged to:: Private residence Living Arrangements: Parent Available Help at Discharge: Family;Available 24 hours/day Type of Home: House Home Access: Level entry     Home Layout: One level Home Equipment: Bedside commode      Prior Function Level of Independence: Independent               Hand Dominance   Dominant Hand: Right    Extremity/Trunk Assessment        Lower Extremity Assessment Lower Extremity Assessment: Generalized weakness    Cervical / Trunk Assessment Cervical / Trunk Assessment: Normal  Communication   Communication: No difficulties  Cognition  Arousal/Alertness: Awake/alert Behavior During Therapy: WFL for tasks assessed/performed Overall Cognitive Status: Within Functional Limits for tasks assessed                                        General Comments      Exercises     Assessment/Plan    PT Assessment Patient needs continued PT services  PT Problem List Decreased strength;Decreased mobility;Decreased coordination;Obesity;Decreased activity tolerance;Decreased balance        PT Treatment Interventions DME instruction;Therapeutic activities;Gait training;Therapeutic exercise;Patient/family education;Stair training;Balance training;Functional mobility training;Neuromuscular re-education    PT Goals (Current goals can be found in the Care Plan section)  Acute Rehab PT Goals Patient Stated Goal: to feel better  PT Goal Formulation: With patient Time For Goal Achievement: 11/21/17 Potential to Achieve Goals: Good    Frequency Min 3X/week   Barriers to discharge        Co-evaluation               AM-PAC PT "6 Clicks" Daily Activity  Outcome Measure Difficulty turning over in bed (including adjusting bedclothes, sheets and blankets)?: None Difficulty moving from lying on back to sitting on the side of the bed? : None Difficulty sitting down on and standing up from a chair with arms (e.g., wheelchair, bedside commode, etc,.)?: None Help needed moving to and from a bed to chair (including a wheelchair)?: A Little Help needed walking in hospital room?: A Little Help needed climbing 3-5 steps with a railing? : A Lot 6 Click Score: 20    End of Session   Activity Tolerance: Patient tolerated treatment well Patient left: in chair;with call bell/phone within reach   PT Visit Diagnosis: Unsteadiness on feet (R26.81);Muscle weakness (generalized) (M62.81);Difficulty in walking, not elsewhere classified (R26.2)    Time: 8875-7972 PT Time Calculation (min) (ACUTE ONLY): 10 min   Charges:   PT Evaluation $PT Eval Low Complexity: 1 Low     PT G Codes:        Deniece Ree PT, DPT, CBIS  Supplemental Physical Therapist [place]   Pager [phone]

## 2017-11-07 NOTE — Progress Notes (Addendum)
Patient ID: Eric Murillo, male   DOB: [birth year], 57 y.o.   MRN: [id number]     Advanced Heart Failure Rounding Note  PCP-Cardiologist: [name], MD   Subjective:    Sluggish urine output. Continues to diurese with IV lasix + metolazone. Says he fell out of the bed last night. He did not report the incidence. Says he was not injured.   Complaining of fatigue. Denies SOB.    Objective:   Weight Range: (!) 331 lb 9.6 oz (150.4 kg) Body mass index is 48.27 kg/m.   Vital Signs:   Temp:  [97.8 F (36.6 C)-98 F (36.7 C)] 98 F (36.7 C) (04/17 0415) Pulse Rate:  [65-75] 75 (04/17 0932) Resp:  [18] 18 (04/17 0415) BP: (100-119)/(58-81) 119/69 (04/17 0932) SpO2:  [94 %-99 %] 94 % (04/17 0415) Weight:  [331 lb 9.6 oz (150.4 kg)] 331 lb 9.6 oz (150.4 kg) (04/17 0415) Last BM Date: 11/02/17  Weight change: Filed Weights   11/05/17 0454 11/06/17 0455 11/07/17 0415  Weight: (!) 322 lb 12.8 oz (146.4 kg) (!) 333 lb 3.2 oz (151.1 kg) (!) 331 lb 9.6 oz (150.4 kg)    Intake/Output:   Intake/Output Summary (Last 24 hours) at 11/07/2017 1008 Last data filed at 11/07/2017 0949 Gross per 24 hour  Intake 1520 ml  Output 2945 ml  Net -1425 ml      Physical Exam    General:  No resp difficulty. Sitting on the side of the bed.  HEENT: normal Neck: supple. JVP to jaw .  Carotids 2+ bilat; no bruits. No lymphadenopathy or thryomegaly appreciated. Cor: PMI nondisplaced. Regular rate & rhythm. No rubs, gallops or murmurs. Lungs: clear Abdomen: obese, soft, nontender, nondistended. No hepatosplenomegaly. No bruits or masses. Good bowel sounds. Extremities: no cyanosis, clubbing, rash, R and LLE compression wraps.  Neuro: alert & orientedx3, cranial nerves grossly intact. moves all 4 extremities w/o difficulty. Affect pleasant   Telemetry   NSR 70s personally reviewed.   Labs    CBC Recent Labs    11/06/17 0713 11/07/17 0502  WBC 13.0* 11.7*  NEUTROABS 10.1* 8.4*  HGB  10.7* 10.8*  HCT 35.4* 35.7*  MCV 88.7 87.5  PLT 216 224   Basic Metabolic Panel Recent Labs    40/98/11 0713 11/07/17 0502  NA 138 136  K 4.9 4.5  CL 98* 98*  CO2 27 26  GLUCOSE 124* 117*  BUN 87* 98*  CREATININE 5.13* 5.31*  CALCIUM 9.1 9.0   Liver Function Tests No results for input(s): AST, ALT, ALKPHOS, BILITOT, PROT, ALBUMIN in the last 72 hours. No results for input(s): LIPASE, AMYLASE in the last 72 hours. Cardiac Enzymes No results for input(s): CKTOTAL, CKMB, CKMBINDEX, TROPONINI in the last 72 hours.  BNP: BNP (last 3 results) Recent Labs    06/05/17 0301 07/26/17 0916 11/03/17 1238  BNP 672.6* 399.7* 1,160.1*    ProBNP (last 3 results) No results for input(s): PROBNP in the last 8760 hours.   D-Dimer No results for input(s): DDIMER in the last 72 hours. Hemoglobin A1C No results for input(s): HGBA1C in the last 72 hours. Fasting Lipid Panel No results for input(s): CHOL, HDL, LDLCALC, TRIG, CHOLHDL, LDLDIRECT in the last 72 hours. Thyroid Function Tests No results for input(s): TSH, T4TOTAL, T3FREE, THYROIDAB in the last 72 hours.  Invalid input(s): FREET3  Other results:   Imaging    No results found.   Medications:     Scheduled Medications: . allopurinol  100  mg Oral BID  . amiodarone  200 mg Oral Daily  . apixaban  5 mg Oral BID  . carvedilol  6.25 mg Oral BID WC  . isosorbide-hydrALAZINE  1 tablet Oral TID  . metolazone  5 mg Oral BID  . potassium chloride SA  40 mEq Oral TID  . sodium chloride flush  3 mL Intravenous Q12H    Infusions: . sodium chloride    . furosemide (LASIX) infusion 20 mg/hr (11/07/17 0859)    PRN Medications: sodium chloride, acetaminophen, acetaminophen, HYDROcodone-homatropine, ondansetron (ZOFRAN) IV, sodium chloride flush  Assessment/Plan   1. Acute on chronic systolic CHF: Nonischemic cardiomyopathy x years. Last echo in 11/18 with moderate LV dilation, EF 20-25%, mildly decreased RV  systolic function.  He had a workup for cardiac amyloidosis in the past.  However, negative SPEP and abdominal fat pad biopsy negative. Low voltage on ECG may be due to obesity and not amyloidosis. CKD stage IV makes management of volume difficult. Remains volume overloaded. Sluggish urine output.  - Continue current regimen.  - Continue Lasix gtt 20 mg/hr and increase metolazone to 5 mg bid.  - Continue current Coreg and Bidil doses.    - No ACEI/ARB/ARNI/spironolactone with CKD IV.  - It is looking like he will end up needing HD for volume management.   2. Atypical atrial flutter: Recurrent. Most recent DCCV in 11/18.  Saw Dr Elberta Fortis, not thought to be a good ablation candidate.  Has been on amiodarone 200 daily.   -Maintaining NSR.    - Continue amiodarone and apixaban.  He has not missed apixaban doses.  3. AKI on CKD stage IV: Creatinine rising this admission.  Suspect that he is going to end up needing HD for volume management.  Renal is following.  Vein mapping ordered. VVS will be consulted after vein mapping. Creatinine/BUN continues to rise.  4. OSA: Has been unable to tolerate CPAP.  5. Morbid obesity-Body mass index is 48.27 kg/m.   Length of Stay: 3  Amy Clegg, NP  11/07/2017, 10:08 AM  Advanced Heart Failure Team Pager [phone] (M-F; 7a - 4p)  Please contact [facility] for night-coverage after hours (4p -7a ) and weekends on [email or web address]  Patient seen with NP, agree with the above note.  BUN/creatinine continuing to trend up.  Diuresis remains sluggish on high dose diuretics.  BP stable.  He is still very volume overloaded on exam.   I think that he is probably going to end up needing HD for volume removal.  We discussed this today.  He is reluctant but agreeable if there is no other option.  Nephrology following.   [name] 11/07/2017 10:50 AM

## 2017-11-07 NOTE — Care Management Note (Signed)
Case Management Note  Patient Details  Name: Ismaila Pelster MRN: [id number] Date of Birth: Aug 05, 1960  Subjective/Objective:                 Patient from home, lives w mom. States he weighs himself at home every few days. Patient states he does not have DME at home. B legs wrapped, states he has had gout "real bad." Will order PT consult as I anticipate he is not very mobile and may benefit from home PT (although it would be limited) and an evaluation for DME needs.  Cardiologist: Marisa Hua PCP Terrilee Croak, [facility]   Action/Plan:   Expected Discharge Date:                  Expected Discharge Plan:  Home/Self Care  In-House Referral:     Discharge planning Services  CM Consult  Post Acute Care Choice:    Choice offered to:     DME Arranged:    DME Agency:     HH Arranged:    HH Agency:     Status of Service:  In process, will continue to follow  If discussed at Long Length of Stay Meetings, dates discussed:    Additional Comments:  Lawerance Sabal, RN 11/07/2017, 8:57 AM

## 2017-11-07 NOTE — Plan of Care (Signed)
  Problem: Education: Goal: Knowledge of General Education information will improve Outcome: Progressing   Problem: Activity: Goal: Risk for activity intolerance will decrease Outcome: Progressing   Problem: Nutrition: Goal: Adequate nutrition will be maintained Outcome: Progressing

## 2017-11-08 ENCOUNTER — Inpatient Hospital Stay (HOSPITAL_COMMUNITY): Payer: Medicaid Other

## 2017-11-08 ENCOUNTER — Encounter (HOSPITAL_COMMUNITY): Payer: Self-pay | Admitting: Interventional Radiology

## 2017-11-08 DIAGNOSIS — N185 Chronic kidney disease, stage 5: Secondary | ICD-10-CM

## 2017-11-08 HISTORY — PX: IR US GUIDE VASC ACCESS RIGHT: IMG2390

## 2017-11-08 HISTORY — PX: IR FLUORO GUIDE CV LINE RIGHT: IMG2283

## 2017-11-08 LAB — BASIC METABOLIC PANEL
Anion gap: 14 (ref 5–15)
BUN: 106 mg/dL — ABNORMAL HIGH (ref 6–20)
CALCIUM: 8.9 mg/dL (ref 8.9–10.3)
CO2: 29 mmol/L (ref 22–32)
CREATININE: 5.14 mg/dL — AB (ref 0.61–1.24)
Chloride: 97 mmol/L — ABNORMAL LOW (ref 101–111)
GFR calc Af Amer: 13 mL/min — ABNORMAL LOW (ref >60)
GFR calc non Af Amer: 11 mL/min — ABNORMAL LOW (ref >60)
GLUCOSE: 142 mg/dL — AB (ref 65–99)
Potassium: 3.7 mmol/L (ref 3.5–5.1)
Sodium: 140 mmol/L (ref 135–145)

## 2017-11-08 LAB — RENAL FUNCTION PANEL
ALBUMIN: 3.6 g/dL (ref 3.5–5.0)
Anion gap: 16 — ABNORMAL HIGH (ref 5–15)
BUN: 105 mg/dL — AB (ref 6–20)
CO2: 30 mmol/L (ref 22–32)
Calcium: 8.9 mg/dL (ref 8.9–10.3)
Chloride: 93 mmol/L — ABNORMAL LOW (ref 101–111)
Creatinine, Ser: 4.87 mg/dL — ABNORMAL HIGH (ref 0.61–1.24)
GFR calc Af Amer: 14 mL/min — ABNORMAL LOW (ref >60)
GFR calc non Af Amer: 12 mL/min — ABNORMAL LOW (ref >60)
GLUCOSE: 128 mg/dL — AB (ref 65–99)
PHOSPHORUS: 6.2 mg/dL — AB (ref 2.5–4.6)
POTASSIUM: 3.4 mmol/L — AB (ref 3.5–5.1)
Sodium: 139 mmol/L (ref 135–145)

## 2017-11-08 LAB — CBC WITH DIFFERENTIAL/PLATELET
BASOS PCT: 0 %
Basophils Absolute: 0 10*3/uL (ref 0.0–0.1)
EOS ABS: 0.1 10*3/uL (ref 0.0–0.7)
Eosinophils Relative: 1 %
HEMATOCRIT: 35.3 % — AB (ref 39.0–52.0)
Hemoglobin: 10.5 g/dL — ABNORMAL LOW (ref 13.0–17.0)
Lymphocytes Relative: 11 %
Lymphs Abs: 1.2 10*3/uL (ref 0.7–4.0)
MCH: 26.4 pg (ref 26.0–34.0)
MCHC: 29.7 g/dL — AB (ref 30.0–36.0)
MCV: 88.9 fL (ref 78.0–100.0)
MONO ABS: 1.5 10*3/uL — AB (ref 0.1–1.0)
MONOS PCT: 14 %
NEUTROS ABS: 7.9 10*3/uL — AB (ref 1.7–7.7)
Neutrophils Relative %: 74 %
Platelets: 247 10*3/uL (ref 150–400)
RBC: 3.97 MIL/uL — ABNORMAL LOW (ref 4.22–5.81)
RDW: 16.2 % — AB (ref 11.5–15.5)
WBC: 10.7 10*3/uL — ABNORMAL HIGH (ref 4.0–10.5)

## 2017-11-08 LAB — CBC
HEMATOCRIT: 34.9 % — AB (ref 39.0–52.0)
Hemoglobin: 10.5 g/dL — ABNORMAL LOW (ref 13.0–17.0)
MCH: 26.6 pg (ref 26.0–34.0)
MCHC: 30.1 g/dL (ref 30.0–36.0)
MCV: 88.4 fL (ref 78.0–100.0)
Platelets: 244 10*3/uL (ref 150–400)
RBC: 3.95 MIL/uL — ABNORMAL LOW (ref 4.22–5.81)
RDW: 16.1 % — AB (ref 11.5–15.5)
WBC: 11 10*3/uL — ABNORMAL HIGH (ref 4.0–10.5)

## 2017-11-08 MED ORDER — LIDOCAINE HCL (PF) 1 % IJ SOLN
INTRAMUSCULAR | Status: DC | PRN
  Administered 2017-11-08: 10 mL

## 2017-11-08 MED ORDER — ALTEPLASE 2 MG IJ SOLR: 2.0000 mg | Freq: Once | INTRAMUSCULAR | Status: DC | PRN

## 2017-11-08 MED ORDER — LIDOCAINE-PRILOCAINE 2.5-2.5 % EX CREA
1.0000 "application " | TOPICAL_CREAM | CUTANEOUS | Status: DC | PRN
  Filled 2017-11-08: qty 5

## 2017-11-08 MED ORDER — PENTAFLUOROPROP-TETRAFLUOROETH EX AERO: 1.0000 "application " | INHALATION_SPRAY | CUTANEOUS | Status: DC | PRN

## 2017-11-08 MED ORDER — SODIUM CHLORIDE 0.9 % IV SOLN: 100.0000 mL | INTRAVENOUS | Status: DC | PRN

## 2017-11-08 MED ORDER — LIDOCAINE HCL (PF) 1 % IJ SOLN: 5.0000 mL | INTRAMUSCULAR | Status: DC | PRN

## 2017-11-08 MED ORDER — HEPARIN SODIUM (PORCINE) 1000 UNIT/ML IJ SOLN
INTRAMUSCULAR | Status: AC
  Administered 2017-11-08: 2.8 mL
  Filled 2017-11-08: qty 1

## 2017-11-08 MED ORDER — LIDOCAINE HCL 1 % IJ SOLN
INTRAMUSCULAR | Status: AC
  Filled 2017-11-08: qty 20

## 2017-11-08 MED ORDER — HEPARIN SODIUM (PORCINE) 5000 UNIT/ML IJ SOLN: 5000.0000 [IU] | Freq: Three times a day (TID) | INTRAMUSCULAR | Status: DC

## 2017-11-08 MED ORDER — ISOSORB DINITRATE-HYDRALAZINE 20-37.5 MG PO TABS
1.0000 | ORAL_TABLET | Freq: Three times a day (TID) | ORAL | Status: DC
  Administered 2017-11-09 – 2017-11-11 (×7): 1 via ORAL
  Filled 2017-11-08 (×8): qty 1

## 2017-11-08 MED ORDER — HEPARIN SODIUM (PORCINE) 1000 UNIT/ML DIALYSIS
1000.0000 [IU] | INTRAMUSCULAR | Status: DC | PRN
  Administered 2017-11-16 (×2): 1000 [IU] via INTRAVENOUS_CENTRAL
  Filled 2017-11-08 (×5): qty 1

## 2017-11-08 MED ORDER — POTASSIUM CHLORIDE CRYS ER 20 MEQ PO TBCR
40.0000 meq | EXTENDED_RELEASE_TABLET | Freq: Three times a day (TID) | ORAL | Status: DC
  Administered 2017-11-09 – 2017-11-11 (×9): 40 meq via ORAL
  Filled 2017-11-08 (×9): qty 2

## 2017-11-08 NOTE — Progress Notes (Signed)
Low bed was place in room as part of fall bundle.  Patient has refused to lay in bed due to fluid in stomach making difficult to breath.   Also encouraged patient to sit back in chair- but said the fluid makes it difficult to breath leaning back.  Placed pillow behind to try to help with comfort- but patient continues to sit on edge of chair.    Continued to encourage patient to call before he gets up

## 2017-11-08 NOTE — Procedures (Signed)
Interventional Radiology Procedure Note  Procedure: Right 20 cm Mahurkur Trialysis catheter.  Tip in the upper RA and ready for use.   Complications: NOne  Estimated Blood Loss: None  Recommendations: - Line ready for use   Signed,  Sterling Big, MD

## 2017-11-08 NOTE — Progress Notes (Signed)
   Patient Status: [facility] - In-pt  Assessment and Plan: Patient in need of venous access to initiate inpatient dialysis.  Patient in need of dialysis.   Request for temporary catheter placement given that he is on Eliquis.   Consent obtained by patient.   ______________________________________________________________________   History of Present Illness: Eric Murillo is a 57 y.o. male with past medical history of a flutter, heart failure, HTN, CKD Stage 4 was admitted to [facility] with shortness of breath.  Patient found to be uremic and with fluid overload. His renal function has not improved and now has plans to initiate dialysis.  IR consulted for temp cath placement.  Patient also seen by VVS for vein mapping.   Allergies and medications reviewed.   Review of Systems: A 12 point ROS discussed and pertinent positives are indicated in the HPI above.  All other systems are negative.  Review of Systems  Constitutional: Positive for fatigue. Negative for fever.  Respiratory: Positive for cough and shortness of breath.   Cardiovascular: Negative for chest pain.  Gastrointestinal: Positive for abdominal distention. Negative for abdominal pain.  Musculoskeletal: Positive for back pain.  Psychiatric/Behavioral: Negative for behavioral problems and confusion.    Vital Signs: BP (!) 131/52 (BP Location: Right Arm)   Pulse 68   Temp (!) 97.4 F (36.3 C) (Oral)   Resp 20   Ht 5' 9.5" (1.765 m)   Wt (!) 330 lb 11.2 oz (150 kg)   SpO2 91%   BMI 48.14 kg/m   Physical Exam  Constitutional: He is oriented to person, place, and time. He appears well-developed.  Neck: Normal range of motion. Neck supple.  Cardiovascular: Normal rate, regular rhythm and normal heart sounds.  Pulmonary/Chest: Effort normal. No respiratory distress.  Neurological: He is alert and oriented to person, place, and time.  Skin: Skin is warm and dry.  Psychiatric: He has a normal mood and affect. His behavior is  normal. Judgment and thought content normal.  Nursing note and vitals reviewed.  Imaging reviewed.   Labs:  COAGS: Recent Labs    03/12/17 2151  INR 1.54    BMP: Recent Labs    11/05/17 0530 11/06/17 0713 11/07/17 0502 11/08/17 0407  NA 137 138 136 140  K 4.5 4.9 4.5 3.7  CL 95* 98* 98* 97*  CO2 29 27 26 29   GLUCOSE 176* 124* 117* 142*  BUN 74* 87* 98* 106*  CALCIUM 8.9 9.1 9.0 8.9  CREATININE 4.42* 5.13* 5.31* 5.14*  GFRNONAA 14* 11* 11* 11*  GFRAA 16* 13* 13* 13*     Electronically Signed: Hoyt Koch, PA 11/08/2017, 2:36 PM   I spent a total of 15 minutes in face to face in clinical consultation, greater than 50% of which was counseling/coordinating care for venous access.

## 2017-11-08 NOTE — Progress Notes (Signed)
Tried to encourage patient to get in bed for safety.   One other nurse was with me trying to help convince him to get in bed.  Patient adamantly refused to get in bed.   Still continued to sit on edge of chair.   Encourage patient to sit back if not going to sit in bed.  Resituated pillows to help get comfortable.  Patient would not lean back.

## 2017-11-08 NOTE — Progress Notes (Signed)
  [facility] Progress Note    Assessment/ Plan:    1  AKI on CKD stage IV--> ESRD - baseline creat 3.5- 4.2 as recently as Feb- Mar this year.  Not as big a response to lasix as hoped.  Preparing for dialysis.  Had several long talks with Eric Murillo.  Willing to proceed.  S/p vein mapping, c/s VVS.  Needs to be off Eliquis for surgery and so will likely need nontunneled HD cath to start HD.  2  Vol overload - acute on chronic, some of leg edema is prob d/t venous insufficiency.  +CHF on xray  3  HTN - on bidil, coreg. BP's ok.   4  Obesity  5  NICM/ sp ICD - EF 30-35%  6  AFib/ flutter - on amio/ BB/ eliquis  Subjective:    Fall last night.  Needs to start dialysis- he's uremic.  Reluctant   Objective:   BP (!) 131/52 (BP Location: Right Arm)   Pulse 68   Temp (!) 97.4 F (36.3 C) (Oral)   Resp 20   Ht 5' 9.5" (1.765 m)   Wt (!) 150 kg (330 lb 11.2 oz)   SpO2 91%   BMI 48.14 kg/m   Intake/Output Summary (Last 24 hours) at 11/08/2017 1233 Last data filed at 11/08/2017 1024 Gross per 24 hour  Intake 1260 ml  Output 2995 ml  Net -1735 ml   Weight change: -0.408 kg (-14.4 oz)  Physical Exam: Gen: obese, sitting on chair, appears uncomfortable, intermittently falling asleep HEENT:+ JVD to angle of mandible upright- not improved CVS: RRR soft systolic murmur Resp: diminished bilateral bases Abd: obese Ext: 3+ LE edema, largely unchanged, unna boots  Imaging: No results found.  Labs: BMET Recent Labs  Lab 11/03/17 1237 11/04/17 0329 11/05/17 0530 11/06/17 0713 11/07/17 0502 11/08/17 0407  NA 139 139 137 138 136 140  K 3.7 4.1 4.5 4.9 4.5 3.7  CL 96* 97* 95* 98* 98* 97*  CO2 29 30 29 27 26 29   GLUCOSE 106* 114* 176* 124* 117* 142*  BUN 63* 67* 74* 87* 98* 106*  CREATININE 3.77* 4.00* 4.42* 5.13* 5.31* 5.14*  CALCIUM 8.9 9.0 8.9 9.1 9.0 8.9   CBC Recent Labs  Lab 11/03/17 1237 11/06/17 0713 11/07/17 0502 11/08/17 0407  WBC 19.1* 13.0* 11.7*  10.7*  NEUTROABS 15.0* 10.1* 8.4* 7.9*  HGB 10.6* 10.7* 10.8* 10.5*  HCT 35.3* 35.4* 35.7* 35.3*  MCV 88.3 88.7 87.5 88.9  PLT 161 216 224 247    Medications:    . allopurinol  100 mg Oral BID  . amiodarone  200 mg Oral Daily  . apixaban  5 mg Oral BID  . carvedilol  6.25 mg Oral BID WC  . isosorbide-hydrALAZINE  1 tablet Oral TID  . metolazone  5 mg Oral BID  . potassium chloride SA  40 mEq Oral TID  . sodium chloride flush  3 mL Intravenous Q12H      Bufford Buttner, MD [facility] pgr [phone] 11/08/2017, 12:33 PM

## 2017-11-08 NOTE — Progress Notes (Signed)
PA- Lisabeth Devoid came by to see and talk to patient.   He said he is ok with patient sleeping sitting on side of bed with 2 safety matt in front of him.  He reported the patient sleep like that at home.

## 2017-11-08 NOTE — Progress Notes (Addendum)
My unit manager and I went in to patient's.  She managed to get patient in bed, but no sooner did we get him in bed, he started trying to get out.   Patient getting very agitated.   Paged Doctor

## 2017-11-08 NOTE — Progress Notes (Signed)
Patient ID: Eric Murillo, male   DOB: 08-24-1960, 57 y.o.   MRN: [id number]     Advanced Heart Failure Rounding Note  PCP-Cardiologist: [name], MD   Subjective:    Better UOP yesterday, I/O net negative 1.5 L but only down 1 lb.  BUN higher, creatinine down a bit.  Still short of breath and orthopneic.     Objective:   Weight Range: (!) 330 lb 11.2 oz (150 kg) Body mass index is 48.14 kg/m.   Vital Signs:   Temp:  [97.5 F (36.4 C)-98 F (36.7 C)] 98 F (36.7 C) (04/18 0413) Pulse Rate:  [59-75] 59 (04/18 0413) Resp:  [18] 18 (04/18 0413) BP: (106-144)/(60-80) 125/62 (04/18 0413) SpO2:  [94 %-100 %] 97 % (04/18 0413) Weight:  [330 lb 11.2 oz (150 kg)] 330 lb 11.2 oz (150 kg) (04/18 0413) Last BM Date: 11/07/17  Weight change: Filed Weights   11/06/17 0455 11/07/17 0415 11/08/17 0413  Weight: (!) 333 lb 3.2 oz (151.1 kg) (!) 331 lb 9.6 oz (150.4 kg) (!) 330 lb 11.2 oz (150 kg)    Intake/Output:   Intake/Output Summary (Last 24 hours) at 11/08/2017 0909 Last data filed at 11/08/2017 0600 Gross per 24 hour  Intake 1500 ml  Output 2995 ml  Net -1495 ml      Physical Exam    General: NAD Neck: JVP 16 cm, no thyromegaly or thyroid nodule.  Lungs: Decreased breath sounds at bases bilaterally. CV: Nondisplaced PMI.  Heart regular S1/S2, no S3/S4, no murmur. 2+ edema to thighs.   Abdomen: Soft, nontender, no hepatosplenomegaly, mild distention.  Skin: Intact without lesions or rashes.  Neurologic: Alert and oriented x 3.  Psych: Normal affect. Extremities: No clubbing or cyanosis.  HEENT: Normal.    Telemetry   NSR 70s personally reviewed.   Labs    CBC Recent Labs    11/07/17 0502 11/08/17 0407  WBC 11.7* 10.7*  NEUTROABS 8.4* 7.9*  HGB 10.8* 10.5*  HCT 35.7* 35.3*  MCV 87.5 88.9  PLT 224 247   Basic Metabolic Panel Recent Labs    25/05/39 0502 11/08/17 0407  NA 136 140  K 4.5 3.7  CL 98* 97*  CO2 26 29  GLUCOSE 117* 142*  BUN 98*  106*  CREATININE 5.31* 5.14*  CALCIUM 9.0 8.9   Liver Function Tests No results for input(s): AST, ALT, ALKPHOS, BILITOT, PROT, ALBUMIN in the last 72 hours. No results for input(s): LIPASE, AMYLASE in the last 72 hours. Cardiac Enzymes No results for input(s): CKTOTAL, CKMB, CKMBINDEX, TROPONINI in the last 72 hours.  BNP: BNP (last 3 results) Recent Labs    06/05/17 0301 07/26/17 0916 11/03/17 1238  BNP 672.6* 399.7* 1,160.1*    ProBNP (last 3 results) No results for input(s): PROBNP in the last 8760 hours.   D-Dimer No results for input(s): DDIMER in the last 72 hours. Hemoglobin A1C No results for input(s): HGBA1C in the last 72 hours. Fasting Lipid Panel No results for input(s): CHOL, HDL, LDLCALC, TRIG, CHOLHDL, LDLDIRECT in the last 72 hours. Thyroid Function Tests No results for input(s): TSH, T4TOTAL, T3FREE, THYROIDAB in the last 72 hours.  Invalid input(s): FREET3  Other results:   Imaging    No results found.   Medications:     Scheduled Medications: . allopurinol  100 mg Oral BID  . amiodarone  200 mg Oral Daily  . apixaban  5 mg Oral BID  . carvedilol  6.25 mg Oral BID  WC  . heparin  5,000 Units Subcutaneous Q8H  . isosorbide-hydrALAZINE  1 tablet Oral TID  . metolazone  5 mg Oral BID  . potassium chloride SA  40 mEq Oral TID  . sodium chloride flush  3 mL Intravenous Q12H    Infusions: . sodium chloride    . furosemide (LASIX) infusion 20 mg/hr (11/08/17 0025)    PRN Medications: sodium chloride, acetaminophen, acetaminophen, HYDROcodone-homatropine, ondansetron (ZOFRAN) IV, sodium chloride flush  Assessment/Plan   1. Acute on chronic systolic CHF: Nonischemic cardiomyopathy x years. Last echo in 11/18 with moderate LV dilation, EF 20-25%, mildly decreased RV systolic function.  He had a workup for cardiac amyloidosis in the past.  However, negative SPEP and abdominal fat pad biopsy negative. Low voltage on ECG may be due to obesity  and not amyloidosis. CKD stage IV makes management of volume difficult. Remains very volume overloaded. Better UOP yesterday, net negative 1.5 L.  - Continue current regimen with Lasix gtt 20 mg/hr and metolazone to 5 mg bid.  - Continue current Coreg and Bidil doses, BP stable.    - No ACEI/ARB/ARNI/spironolactone with CKD IV.  - Despite better UOP yesterday, I still think that HD is going to be needed for long-term volume management.    2. Atypical atrial flutter: Recurrent. Most recent DCCV in 11/18.  Saw Dr Elberta Fortis, not thought to be a good ablation candidate.  Maintaining NSR currently.    - Continue amiodarone and apixaban.  He has not missed apixaban doses.  3. AKI on CKD stage IV: BUN/creatinine have been rising with sluggish diuresis.  Suspect that he is going to end up needing HD for volume management, as above.  Renal is following.  Vein mapping done and VVS consulted.  4. OSA: Has been unable to tolerate CPAP.  5. Morbid obesity-Body mass index is 48.14 kg/m.  Length of Stay: 4  [name], MD  11/08/2017, 9:09 AM  Advanced Heart Failure Team Pager [phone] (M-F; 7a - 4p)  Please contact [facility] for night-coverage after hours (4p -7a ) and weekends on [email or web address]

## 2017-11-08 NOTE — Progress Notes (Signed)
Patient was found on the floor by the student's  preceptor.  Preceptor reports they were in room next door, heard a "thud sound".  When he went to check patient was near sink with leg under sink.  Patient reported to preceptor he did not hit his head.   Nurse tech was called in- nurse was notified.     Vital signs were stable, no visible injuries to rear area or head.  Patient stated he fell asleep while he was standing.   Has not been able to sleep while here- very groggy on assessment.   Paged MD- awaiting on orders.    During full nursing assessment patient change his mind said he may have hit his head on wall.  But did not fall back onto floor.   Fall bundle initiated.  Chair alarm on, yellow socks placed, arm band on.   Patient has never fallen before.  Has ambulated in room with out issue prior to this incident.    Patient very reluctant to wait for Nurse or tech to be in room when he needs to get up.  Did not want assistance when getting up.  Did not want to use gait belt.

## 2017-11-08 NOTE — Progress Notes (Addendum)
MD paged about patient fall this morning 3 times.    MD returned call just now.  Gave up date regarding patient fall-  V.S WNL; no injury noted on rear/back / head;  Implemented fall bundle- chair alarm, bractlet, yellow socks, low bed, mats on floor.  Will continue to monitor

## 2017-11-08 NOTE — Progress Notes (Addendum)
Patient fell asleep in chair and was falling forward.  Tried to get patient to get in bed.  Patient is refusing.  Saying he did not fall asleep

## 2017-11-08 NOTE — Consult Note (Addendum)
Hospital Consult    Reason for Consult:  In need of [facility] and permanent acess Requesting Physician:  Signe Colt MRN #:  [id number]  History of Present Illness: This is a 57 y.o. male who was admitted on 11/03/17 with increasing shortness of breath and flu symptoms.  When in the ER, he was found to be volume overloaded as well as atrial flutter with controlled rate.  His BNP was 1160.   He does have wraps on both legs.  He denies ulcers and says "it is to control the fluid in my legs".  He has hx of CKD 4 & now ESRD and in need of TDC and permanent dialysis access.  He is right hand dominant.    He is on Eliquis for Afib/flutter.   He has an EF of 30-35%.    He is sleepy during my time with him and he seemed to doze off a couple of times.  He is able to answer all my questions.  He states he has not slept since he has been here.  He did have a fall this am and it is unclear if he hit his head on the wall or not.    He is on beta blocker for afib.   Past Medical History:  Diagnosis Date  . AICD (automatic cardioverter/defibrillator) present   . Anemia of chronic disease   . Arthritis   . Atrial flutter (HCC)    DCCV 9/15  . Chest pain on exertion   . Chronic systolic congestive heart failure, NYHA class 2 (HCC)    EF 20-25% 1/16  . CKD (chronic kidney disease) stage 3, GFR 30-59 ml/min (HCC)   . Dyslipidemia   . Gout    Of big toe  . Hyperglycemia   . Hyperlipidemia   . Hypertension   . Morbid obesity with BMI of 45.0-49.9, adult (HCC)   . Non-ischemic cardiomyopathy (HCC)   . Organic erectile dysfunction   . OSA (obstructive sleep apnea)   . Pilonidal cyst   . Submandibular sialolithiasis    Right    Past Surgical History:  Procedure Laterality Date  . CARDIAC CATHETERIZATION  2012  . CARDIOVERSION N/A 04/03/2014   Procedure: CARDIOVERSION;  Surgeon: Laurey Morale, MD;  Location: [facility] ENDOSCOPY;  Service: Cardiovascular;  Laterality: N/A;  . CARDIOVERSION N/A 03/09/2016   Procedure: CARDIOVERSION;  Surgeon: Laurey Morale, MD;  Location: [facility] ENDOSCOPY;  Service: Cardiovascular;  Laterality: N/A;  . CARDIOVERSION N/A 06/06/2017   Procedure: CARDIOVERSION;  Surgeon: Laurey Morale, MD;  Location: [facility] ENDOSCOPY;  Service: Cardiovascular;  Laterality: N/A;  . COLONOSCOPY    . EP IMPLANTABLE DEVICE N/A 05/21/2015   Procedure: ICD Implant;  Surgeon: Duke Salvia, MD;  Location: [facility] INVASIVE CV LAB;  Service: Cardiovascular;  Laterality: N/A;  . LEFT AND RIGHT HEART CATHETERIZATION WITH CORONARY ANGIOGRAM N/A 04/24/2014   Procedure: LEFT AND RIGHT HEART CATHETERIZATION WITH CORONARY ANGIOGRAM;  Surgeon: Laurey Morale, MD;  Location: [facility] CATH LAB;  Service: Cardiovascular;  Laterality: N/A;  . SALIVARY GLAND SURGERY  09/12/2012  . SUBMANDIBULAR GLAND EXCISION Right 09/12/2012   Procedure: Removal Right Submandibular Larina Bras;  Surgeon: Serena Colonel, MD;  Location: [facility] OR;  Service: ENT;  Laterality: Right;  . TEE WITHOUT CARDIOVERSION N/A 04/03/2014   Procedure: TRANSESOPHAGEAL ECHOCARDIOGRAM (TEE);  Surgeon: Laurey Morale, MD;  Location: [facility] ENDOSCOPY;  Service: Cardiovascular;  Laterality: N/A;  . TEE WITHOUT CARDIOVERSION N/A 03/09/2016   Procedure: TRANSESOPHAGEAL ECHOCARDIOGRAM (TEE);  Surgeon:  Laurey Morale, MD;  Location: [facility] ENDOSCOPY;  Service: Cardiovascular;  Laterality: N/A;  . TEE WITHOUT CARDIOVERSION N/A 06/06/2017   Procedure: TRANSESOPHAGEAL ECHOCARDIOGRAM (TEE);  Surgeon: Laurey Morale, MD;  Location: [facility] ENDOSCOPY;  Service: Cardiovascular;  Laterality: N/A;    Allergies  Allergen Reactions  . Nsaids Other (See Comments)    Cannot take to due kidney issues  . Beta Adrenergic Blockers Other (See Comments)    "An unnamed, white-colored Beta Blocker made" him "feel funny" = made him feel cagey    Prior to Admission medications   Medication Sig Start Date End Date Taking? Authorizing Provider  acetaminophen (TYLENOL) 500 MG tablet Take 2 tablets  (1,000 mg total) by mouth every 8 (eight) hours as needed. Patient taking differently: Take 1,000 mg every 8 (eight) hours as needed by mouth (for pain or headaches).  02/07/17 02/07/18 Yes Darreld Mclean, MD  allopurinol (ZYLOPRIM) 100 MG tablet TAKE 1 TABLET BY MOUTH TWO TIMES DAILY 09/19/17  Yes Darreld Mclean, MD  amiodarone (PACERONE) 200 MG tablet Take 1 tablet (200 mg total) by mouth daily. TAKE 2 TABLETS DAILY UNTIL 06/14/2017, and then resume 1 daily Patient taking differently: Take 200 mg by mouth daily.  06/27/17  Yes Darreld Mclean, MD  carvedilol (COREG) 6.25 MG tablet Take 1 tablet (6.25 mg total) 2 (two) times daily with a meal by mouth. 06/11/17  Yes Ginger Carne, MD  colchicine 0.6 MG tablet Take 0.6 mg 2 (two) times daily as needed by mouth (FOR GOUT FLARES). Reported on 01/18/2016   Yes [provider]  diclofenac sodium (VOLTAREN) 1 % GEL Apply 4 g topically 4 (four) times daily. 02/07/17  Yes Darreld Mclean, MD  ELIQUIS 5 MG TABS tablet TAKE ONE TABLET BY MOUTH TWICE DAILY 08/23/17  Yes Darreld Mclean, MD  isosorbide-hydrALAZINE (BIDIL) 20-37.5 MG tablet Take 1 tablet 3 (three) times daily by mouth. 06/11/17  Yes Ginger Carne, MD  metolazone (ZAROXOLYN) 2.5 MG tablet Take 1 tablet (2.5 mg total) by mouth once a week. Every Wednesday, take extra as directed 08/03/17  Yes Tillery, Mariam Dollar, PA-C  potassium chloride SA (KLOR-CON M20) 20 MEQ tablet Take 2 tablets (40 mEq total) by mouth 3 (three) times daily. 07/26/17  Yes Clegg, Amy D, NP  torsemide (DEMADEX) 20 MG tablet Take 5 tablets (100 mg total) by mouth 2 (two) times daily. 07/26/17  Yes Clegg, Amy D, NP    Social History   Socioeconomic History  . Marital status: Single    Spouse name: Not on file  . Number of children: Not on file  . Years of education: Not on file  . Highest education level: Not on file  Occupational History  . Not on file  Social Needs  . Financial resource strain: Not on file  . Food  insecurity:    Worry: Not on file    Inability: Not on file  . Transportation needs:    Medical: Not on file    Non-medical: Not on file  Tobacco Use  . Smoking status: Never Smoker  . Smokeless tobacco: Never Used  Substance and Sexual Activity  . Alcohol use: No    Alcohol/week: 0.0 oz  . Drug use: No  . Sexual activity: Not on file  Lifestyle  . Physical activity:    Days per week: Not on file    Minutes per session: Not on file  . Stress: Not on file  Relationships  . Social connections:    Talks  on phone: Not on file    Gets together: Not on file    Attends religious service: Not on file    Active member of club or organization: Not on file    Attends meetings of clubs or organizations: Not on file    Relationship status: Not on file  . Intimate partner violence:    Fear of current or ex partner: Not on file    Emotionally abused: Not on file    Physically abused: Not on file    Forced sexual activity: Not on file  Other Topics Concern  . Not on file  Social History Narrative   Financial assistance approved for 100% discount at [facility] and has [facility] card   [employer]  March 16, 2010 9:42 AM      Lives with mother.  Has a girlfriend     Family History  Problem Relation Age of Onset  . Hypertension Mother   . Hypertension Father   . Cancer Father        brother died of brain cancer; sister died of bone cancer  . Diabetes Sister   . Diabetes Brother   . Colon cancer Maternal Aunt   . Cardiomyopathy Neg Hx     ROS: [x]  Positive   [ ]  Negative   [ ]  All sytems reviewed and are negative  Cardiac: [x]  orthopnea [x]  DOE [x]  AICD [x]  afib/flutter  Vascular: [x]  swelling in legs  Pulmonary: [x]  OSA   Neurologic: []  denies hx of stroke  Hematologic: []  hx of cancer []  bleeding problems []  problems with blood clotting easily  Endocrine:   []  diabetes []  thyroid disease  GI [x]  hernia  GU: [x]  CKD/renal failure []  HD--[]  M/W/F or []  T/T/S []   burning with urination []  blood in urine  Psychiatric: []  anxiety []  depression  Musculoskeletal: []  arthritis []  joint pain [x]  gout  Integumentary: []  rashes []  ulcers  Constitutional: []  fever [x]  chills   Physical Examination  Vitals:   11/08/17 0413 11/08/17 0942  BP: 125/62 130/78  Pulse: (!) 59 75  Resp: 18   Temp: 98 F (36.7 C) 98 F (36.7 C)  SpO2: 97%    Body mass index is 48.14 kg/m.  General:  WDWN in NAD but sleepy Gait: Not observed HENT: WNL, normocephalic Pulmonary: normal non-labored breathing, without Rales, rhonchi,  wheezing Cardiac: regular, without  Murmurs, rubs or gallops; without carotid bruits Abdomen: obese Skin: without rashes Vascular Exam/Pulses:  Right Left  Radial 2+ (normal) 2+ (normal)  Ulnar 1+ (weak) Unable to palpate  Brachial 2+ (normal) 2+ (normal)  DP Una boot Una boot  PT [employer] boot   Extremities: IV right forearm in place; BLE with swelling and una boot in place Musculoskeletal: no muscle wasting or atrophy  Neurologic: A&O X 3;  No focal weakness or paresthesias are detected; speech is fluent/normal Psychiatric:  The pt has flat affect. Lymph : No Cervical, Axillary, or Inguinal lymphadenopathy     CBC    Component Value Date/Time   WBC 10.7 (H) 11/08/2017 0407   RBC 3.97 (L) 11/08/2017 0407   HGB 10.5 (L) 11/08/2017 0407   HCT 35.3 (L) 11/08/2017 0407   PLT 247 11/08/2017 0407   MCV 88.9 11/08/2017 0407   MCH 26.4 11/08/2017 0407   MCHC 29.7 (L) 11/08/2017 0407   RDW 16.2 (H) 11/08/2017 0407   LYMPHSABS 1.2 11/08/2017 0407   MONOABS 1.5 (H) 11/08/2017 0407   EOSABS 0.1 11/08/2017 0407  BASOSABS 0.0 11/08/2017 0407    BMET    Component Value Date/Time   NA 140 11/08/2017 0407   NA 143 02/07/2017 1502   K 3.7 11/08/2017 0407   CL 97 (L) 11/08/2017 0407   CO2 29 11/08/2017 0407   GLUCOSE 142 (H) 11/08/2017 0407   BUN 106 (H) 11/08/2017 0407   BUN 33 (H) 02/07/2017 1502   CREATININE  5.14 (H) 11/08/2017 0407   CREATININE 1.44 (H) 05/14/2015 0959   CALCIUM 8.9 11/08/2017 0407   GFRNONAA 11 (L) 11/08/2017 0407   GFRNONAA 65 07/10/2013 1604   GFRAA 13 (L) 11/08/2017 0407   GFRAA 75 07/10/2013 1604    COAGS: Lab Results  Component Value Date   INR 1.54 03/12/2017   INR 1.26 01/15/2015   INR 1.33 04/23/2014     Non-Invasive Vascular Imaging:   BUE Vein mapping 11/07/17: Right Cephalic  Diameter (mm)Depth (mm)Findings +-----------------+-------------+----------+--------+ Shoulder       3.83     8.39       +-----------------+-------------+----------+--------+ Mid upper arm    4.26     5.70       +-----------------+-------------+----------+--------+ Antecubital fossa  6.69     2.93       +-----------------+-------------+----------+--------+ Prox forearm     4.31     2.66       +-----------------+-------------+----------+--------+ Mid forearm     4.42     2.77       +-----------------+-------------+----------+--------+ Dist forearm     3.20     3.04   Right basilic not visualized  Left Cephalic  Diameter (mm)Depth (mm)  Findings   +-----------------+-------------+----------+--------------+ Shoulder                 not visualized +-----------------+-------------+----------+--------------+ Prox upper arm    2.43     8.57          +-----------------+-------------+----------+--------------+ Mid upper arm    2.28     4.88          +-----------------+-------------+----------+--------------+ Dist upper arm    1.91     4.61   branching   +-----------------+-------------+----------+--------------+ Antecubital fossa  4.70     2.90   thrombosed  +-----------------+-------------+----------+--------------+ Prox forearm     4.24     2.98           +-----------------+-------------+----------+--------------+ Mid forearm     2.77     3.29          +-----------------+-------------+----------+--------------+ Dist forearm     3.34     3.47            Statin:  No. Beta Blocker:  Yes.   Aspirin:  No. ACEI:  No. ARB:  No. CCB use:  No Other antiplatelets/anticoagulants:  Yes.   Eliquis   ASSESSMENT/PLAN: This is a 57 y.o. male with CKD 4 now ESRD in need of TDC and permanent dialysis access   -discussed options of access.   - he will need to be off Eliquis for a couple of days prior to access   Doreatha Massed, PA-C [facility] [phone]  Addendum  I have independently interviewed and examined the patient, and I agree with the physician assistant's findings.  Pt just had temporary dialysis catheter placed in RIJV.  Best vein is the cephalic vein in the patient's R arm, so will schedule for R RC vs BC AVF and convert RIJV temporary dialysis to tunneled dialysis catheter at same time on Monday or Tuesday while Eliquis is reversing.  Risk, benefits, and alternatives to access surgery were discussed.    The patient is aware the risks include but are not limited to: bleeding, infection, steal syndrome, nerve damage, ischemic monomelic neuropathy, thrombosis, failure to mature, complications related to venous hypertension, need for additional procedures, death and stroke.    The patient agrees to proceed forward with the procedure.   Leonides Sake, MD, FACS [facility] [place] Office: [phone] Pager: [phone]  11/08/2017, 8:07 PM

## 2017-11-08 NOTE — H&P (View-Only) (Signed)
Hospital Consult    Reason for Consult:  In need of [facility] and permanent acess Requesting Physician:  Signe Colt MRN #:  [id number]  History of Present Illness: This is a 57 y.o. male who was admitted on 11/03/17 with increasing shortness of breath and flu symptoms.  When in the ER, he was found to be volume overloaded as well as atrial flutter with controlled rate.  His BNP was 1160.   He does have wraps on both legs.  He denies ulcers and says "it is to control the fluid in my legs".  He has hx of CKD 4 & now ESRD and in need of TDC and permanent dialysis access.  He is right hand dominant.    He is on Eliquis for Afib/flutter.   He has an EF of 30-35%.    He is sleepy during my time with him and he seemed to doze off a couple of times.  He is able to answer all my questions.  He states he has not slept since he has been here.  He did have a fall this am and it is unclear if he hit his head on the wall or not.    He is on beta blocker for afib.   Past Medical History:  Diagnosis Date  . AICD (automatic cardioverter/defibrillator) present   . Anemia of chronic disease   . Arthritis   . Atrial flutter (HCC)    DCCV 9/15  . Chest pain on exertion   . Chronic systolic congestive heart failure, NYHA class 2 (HCC)    EF 20-25% 1/16  . CKD (chronic kidney disease) stage 3, GFR 30-59 ml/min (HCC)   . Dyslipidemia   . Gout    Of big toe  . Hyperglycemia   . Hyperlipidemia   . Hypertension   . Morbid obesity with BMI of 45.0-49.9, adult (HCC)   . Non-ischemic cardiomyopathy (HCC)   . Organic erectile dysfunction   . OSA (obstructive sleep apnea)   . Pilonidal cyst   . Submandibular sialolithiasis    Right    Past Surgical History:  Procedure Laterality Date  . CARDIAC CATHETERIZATION  2012  . CARDIOVERSION N/A 04/03/2014   Procedure: CARDIOVERSION;  Surgeon: Laurey Morale, MD;  Location: [facility] ENDOSCOPY;  Service: Cardiovascular;  Laterality: N/A;  . CARDIOVERSION N/A 03/09/2016   Procedure: CARDIOVERSION;  Surgeon: Laurey Morale, MD;  Location: [facility] ENDOSCOPY;  Service: Cardiovascular;  Laterality: N/A;  . CARDIOVERSION N/A 06/06/2017   Procedure: CARDIOVERSION;  Surgeon: Laurey Morale, MD;  Location: [facility] ENDOSCOPY;  Service: Cardiovascular;  Laterality: N/A;  . COLONOSCOPY    . EP IMPLANTABLE DEVICE N/A 05/21/2015   Procedure: ICD Implant;  Surgeon: Duke Salvia, MD;  Location: [facility] INVASIVE CV LAB;  Service: Cardiovascular;  Laterality: N/A;  . LEFT AND RIGHT HEART CATHETERIZATION WITH CORONARY ANGIOGRAM N/A 04/24/2014   Procedure: LEFT AND RIGHT HEART CATHETERIZATION WITH CORONARY ANGIOGRAM;  Surgeon: Laurey Morale, MD;  Location: [facility] CATH LAB;  Service: Cardiovascular;  Laterality: N/A;  . SALIVARY GLAND SURGERY  09/12/2012  . SUBMANDIBULAR GLAND EXCISION Right 09/12/2012   Procedure: Removal Right Submandibular Larina Bras;  Surgeon: Serena Colonel, MD;  Location: [facility] OR;  Service: ENT;  Laterality: Right;  . TEE WITHOUT CARDIOVERSION N/A 04/03/2014   Procedure: TRANSESOPHAGEAL ECHOCARDIOGRAM (TEE);  Surgeon: Laurey Morale, MD;  Location: [facility] ENDOSCOPY;  Service: Cardiovascular;  Laterality: N/A;  . TEE WITHOUT CARDIOVERSION N/A 03/09/2016   Procedure: TRANSESOPHAGEAL ECHOCARDIOGRAM (TEE);  Surgeon:  Laurey Morale, MD;  Location: [facility] ENDOSCOPY;  Service: Cardiovascular;  Laterality: N/A;  . TEE WITHOUT CARDIOVERSION N/A 06/06/2017   Procedure: TRANSESOPHAGEAL ECHOCARDIOGRAM (TEE);  Surgeon: Laurey Morale, MD;  Location: [facility] ENDOSCOPY;  Service: Cardiovascular;  Laterality: N/A;    Allergies  Allergen Reactions  . Nsaids Other (See Comments)    Cannot take to due kidney issues  . Beta Adrenergic Blockers Other (See Comments)    "An unnamed, white-colored Beta Blocker made" him "feel funny" = made him feel cagey    Prior to Admission medications   Medication Sig Start Date End Date Taking? Authorizing Provider  acetaminophen (TYLENOL) 500 MG tablet Take 2 tablets  (1,000 mg total) by mouth every 8 (eight) hours as needed. Patient taking differently: Take 1,000 mg every 8 (eight) hours as needed by mouth (for pain or headaches).  02/07/17 02/07/18 Yes Darreld Mclean, MD  allopurinol (ZYLOPRIM) 100 MG tablet TAKE 1 TABLET BY MOUTH TWO TIMES DAILY 09/19/17  Yes Darreld Mclean, MD  amiodarone (PACERONE) 200 MG tablet Take 1 tablet (200 mg total) by mouth daily. TAKE 2 TABLETS DAILY UNTIL 06/14/2017, and then resume 1 daily Patient taking differently: Take 200 mg by mouth daily.  06/27/17  Yes Darreld Mclean, MD  carvedilol (COREG) 6.25 MG tablet Take 1 tablet (6.25 mg total) 2 (two) times daily with a meal by mouth. 06/11/17  Yes Ginger Carne, MD  colchicine 0.6 MG tablet Take 0.6 mg 2 (two) times daily as needed by mouth (FOR GOUT FLARES). Reported on 01/18/2016   Yes [provider]  diclofenac sodium (VOLTAREN) 1 % GEL Apply 4 g topically 4 (four) times daily. 02/07/17  Yes Darreld Mclean, MD  ELIQUIS 5 MG TABS tablet TAKE ONE TABLET BY MOUTH TWICE DAILY 08/23/17  Yes Darreld Mclean, MD  isosorbide-hydrALAZINE (BIDIL) 20-37.5 MG tablet Take 1 tablet 3 (three) times daily by mouth. 06/11/17  Yes Ginger Carne, MD  metolazone (ZAROXOLYN) 2.5 MG tablet Take 1 tablet (2.5 mg total) by mouth once a week. Every Wednesday, take extra as directed 08/03/17  Yes Tillery, Mariam Dollar, PA-C  potassium chloride SA (KLOR-CON M20) 20 MEQ tablet Take 2 tablets (40 mEq total) by mouth 3 (three) times daily. 07/26/17  Yes Clegg, Amy D, NP  torsemide (DEMADEX) 20 MG tablet Take 5 tablets (100 mg total) by mouth 2 (two) times daily. 07/26/17  Yes Clegg, Amy D, NP    Social History   Socioeconomic History  . Marital status: Single    Spouse name: Not on file  . Number of children: Not on file  . Years of education: Not on file  . Highest education level: Not on file  Occupational History  . Not on file  Social Needs  . Financial resource strain: Not on file  . Food  insecurity:    Worry: Not on file    Inability: Not on file  . Transportation needs:    Medical: Not on file    Non-medical: Not on file  Tobacco Use  . Smoking status: Never Smoker  . Smokeless tobacco: Never Used  Substance and Sexual Activity  . Alcohol use: No    Alcohol/week: 0.0 oz  . Drug use: No  . Sexual activity: Not on file  Lifestyle  . Physical activity:    Days per week: Not on file    Minutes per session: Not on file  . Stress: Not on file  Relationships  . Social connections:    Talks  on phone: Not on file    Gets together: Not on file    Attends religious service: Not on file    Active member of club or organization: Not on file    Attends meetings of clubs or organizations: Not on file    Relationship status: Not on file  . Intimate partner violence:    Fear of current or ex partner: Not on file    Emotionally abused: Not on file    Physically abused: Not on file    Forced sexual activity: Not on file  Other Topics Concern  . Not on file  Social History Narrative   Financial assistance approved for 100% discount at [facility] and has [facility] card   [employer]  March 16, 2010 9:42 AM      Lives with mother.  Has a girlfriend     Family History  Problem Relation Age of Onset  . Hypertension Mother   . Hypertension Father   . Cancer Father        brother died of brain cancer; sister died of bone cancer  . Diabetes Sister   . Diabetes Brother   . Colon cancer Maternal Aunt   . Cardiomyopathy Neg Hx     ROS: [x]  Positive   [ ]  Negative   [ ]  All sytems reviewed and are negative  Cardiac: [x]  orthopnea [x]  DOE [x]  AICD [x]  afib/flutter  Vascular: [x]  swelling in legs  Pulmonary: [x]  OSA   Neurologic: []  denies hx of stroke  Hematologic: []  hx of cancer []  bleeding problems []  problems with blood clotting easily  Endocrine:   []  diabetes []  thyroid disease  GI [x]  hernia  GU: [x]  CKD/renal failure []  HD--[]  M/W/F or []  T/T/S []   burning with urination []  blood in urine  Psychiatric: []  anxiety []  depression  Musculoskeletal: []  arthritis []  joint pain [x]  gout  Integumentary: []  rashes []  ulcers  Constitutional: []  fever [x]  chills   Physical Examination  Vitals:   11/08/17 0413 11/08/17 0942  BP: 125/62 130/78  Pulse: (!) 59 75  Resp: 18   Temp: 98 F (36.7 C) 98 F (36.7 C)  SpO2: 97%    Body mass index is 48.14 kg/m.  General:  WDWN in NAD but sleepy Gait: Not observed HENT: WNL, normocephalic Pulmonary: normal non-labored breathing, without Rales, rhonchi,  wheezing Cardiac: regular, without  Murmurs, rubs or gallops; without carotid bruits Abdomen: obese Skin: without rashes Vascular Exam/Pulses:  Right Left  Radial 2+ (normal) 2+ (normal)  Ulnar 1+ (weak) Unable to palpate  Brachial 2+ (normal) 2+ (normal)  DP Una boot Una boot  PT [employer] boot   Extremities: IV right forearm in place; BLE with swelling and una boot in place Musculoskeletal: no muscle wasting or atrophy  Neurologic: A&O X 3;  No focal weakness or paresthesias are detected; speech is fluent/normal Psychiatric:  The pt has flat affect. Lymph : No Cervical, Axillary, or Inguinal lymphadenopathy     CBC    Component Value Date/Time   WBC 10.7 (H) 11/08/2017 0407   RBC 3.97 (L) 11/08/2017 0407   HGB 10.5 (L) 11/08/2017 0407   HCT 35.3 (L) 11/08/2017 0407   PLT 247 11/08/2017 0407   MCV 88.9 11/08/2017 0407   MCH 26.4 11/08/2017 0407   MCHC 29.7 (L) 11/08/2017 0407   RDW 16.2 (H) 11/08/2017 0407   LYMPHSABS 1.2 11/08/2017 0407   MONOABS 1.5 (H) 11/08/2017 0407   EOSABS 0.1 11/08/2017 0407  BASOSABS 0.0 11/08/2017 0407    BMET    Component Value Date/Time   NA 140 11/08/2017 0407   NA 143 02/07/2017 1502   K 3.7 11/08/2017 0407   CL 97 (L) 11/08/2017 0407   CO2 29 11/08/2017 0407   GLUCOSE 142 (H) 11/08/2017 0407   BUN 106 (H) 11/08/2017 0407   BUN 33 (H) 02/07/2017 1502   CREATININE  5.14 (H) 11/08/2017 0407   CREATININE 1.44 (H) 05/14/2015 0959   CALCIUM 8.9 11/08/2017 0407   GFRNONAA 11 (L) 11/08/2017 0407   GFRNONAA 65 07/10/2013 1604   GFRAA 13 (L) 11/08/2017 0407   GFRAA 75 07/10/2013 1604    COAGS: Lab Results  Component Value Date   INR 1.54 03/12/2017   INR 1.26 01/15/2015   INR 1.33 04/23/2014     Non-Invasive Vascular Imaging:   BUE Vein mapping 11/07/17: Right Cephalic  Diameter (mm)Depth (mm)Findings +-----------------+-------------+----------+--------+ Shoulder       3.83     8.39       +-----------------+-------------+----------+--------+ Mid upper arm    4.26     5.70       +-----------------+-------------+----------+--------+ Antecubital fossa  6.69     2.93       +-----------------+-------------+----------+--------+ Prox forearm     4.31     2.66       +-----------------+-------------+----------+--------+ Mid forearm     4.42     2.77       +-----------------+-------------+----------+--------+ Dist forearm     3.20     3.04   Right basilic not visualized  Left Cephalic  Diameter (mm)Depth (mm)  Findings   +-----------------+-------------+----------+--------------+ Shoulder                 not visualized +-----------------+-------------+----------+--------------+ Prox upper arm    2.43     8.57          +-----------------+-------------+----------+--------------+ Mid upper arm    2.28     4.88          +-----------------+-------------+----------+--------------+ Dist upper arm    1.91     4.61   branching   +-----------------+-------------+----------+--------------+ Antecubital fossa  4.70     2.90   thrombosed  +-----------------+-------------+----------+--------------+ Prox forearm     4.24     2.98           +-----------------+-------------+----------+--------------+ Mid forearm     2.77     3.29          +-----------------+-------------+----------+--------------+ Dist forearm     3.34     3.47            Statin:  No. Beta Blocker:  Yes.   Aspirin:  No. ACEI:  No. ARB:  No. CCB use:  No Other antiplatelets/anticoagulants:  Yes.   Eliquis   ASSESSMENT/PLAN: This is a 57 y.o. male with CKD 4 now ESRD in need of TDC and permanent dialysis access   -discussed options of access.   - he will need to be off Eliquis for a couple of days prior to access   Doreatha Massed, PA-C [facility] [phone]  Addendum  I have independently interviewed and examined the patient, and I agree with the physician assistant's findings.  Pt just had temporary dialysis catheter placed in RIJV.  Best vein is the cephalic vein in the patient's R arm, so will schedule for R RC vs BC AVF and convert RIJV temporary dialysis to tunneled dialysis catheter at same time on Monday or Tuesday while Eliquis is reversing.  Risk, benefits, and alternatives to access surgery were discussed.    The patient is aware the risks include but are not limited to: bleeding, infection, steal syndrome, nerve damage, ischemic monomelic neuropathy, thrombosis, failure to mature, complications related to venous hypertension, need for additional procedures, death and stroke.    The patient agrees to proceed forward with the procedure.   Leonides Sake, MD, FACS [facility] [place] Office: [phone] Pager: [phone]  11/08/2017, 8:07 PM

## 2017-11-09 LAB — BASIC METABOLIC PANEL
Anion gap: 19 — ABNORMAL HIGH (ref 5–15)
BUN: 111 mg/dL — ABNORMAL HIGH (ref 6–20)
CHLORIDE: 92 mmol/L — AB (ref 101–111)
CO2: 28 mmol/L (ref 22–32)
CREATININE: 4.76 mg/dL — AB (ref 0.61–1.24)
Calcium: 9 mg/dL (ref 8.9–10.3)
GFR calc non Af Amer: 12 mL/min — ABNORMAL LOW (ref >60)
GFR, EST AFRICAN AMERICAN: 14 mL/min — AB (ref >60)
Glucose, Bld: 161 mg/dL — ABNORMAL HIGH (ref 65–99)
Potassium: 3.5 mmol/L (ref 3.5–5.1)
Sodium: 139 mmol/L (ref 135–145)

## 2017-11-09 LAB — PROTIME-INR
INR: 1.85
Prothrombin Time: 21.2 seconds — ABNORMAL HIGH (ref 11.4–15.2)

## 2017-11-09 LAB — CBC WITH DIFFERENTIAL/PLATELET
Basophils Absolute: 0 10*3/uL (ref 0.0–0.1)
Basophils Relative: 0 %
Eosinophils Absolute: 0.1 10*3/uL (ref 0.0–0.7)
Eosinophils Relative: 1 %
HEMATOCRIT: 36.6 % — AB (ref 39.0–52.0)
HEMOGLOBIN: 10.7 g/dL — AB (ref 13.0–17.0)
LYMPHS ABS: 1.1 10*3/uL (ref 0.7–4.0)
LYMPHS PCT: 10 %
MCH: 25.9 pg — ABNORMAL LOW (ref 26.0–34.0)
MCHC: 29.2 g/dL — AB (ref 30.0–36.0)
MCV: 88.6 fL (ref 78.0–100.0)
MONOS PCT: 13 %
Monocytes Absolute: 1.4 10*3/uL — ABNORMAL HIGH (ref 0.1–1.0)
NEUTROS PCT: 76 %
Neutro Abs: 8.5 10*3/uL — ABNORMAL HIGH (ref 1.7–7.7)
Platelets: 253 10*3/uL (ref 150–400)
RBC: 4.13 MIL/uL — ABNORMAL LOW (ref 4.22–5.81)
RDW: 16.1 % — ABNORMAL HIGH (ref 11.5–15.5)
WBC: 11.1 10*3/uL — ABNORMAL HIGH (ref 4.0–10.5)

## 2017-11-09 LAB — SURGICAL PCR SCREEN
MRSA, PCR: NEGATIVE
Staphylococcus aureus: NEGATIVE

## 2017-11-09 LAB — HEPATITIS B SURFACE ANTIGEN: Hepatitis B Surface Ag: NEGATIVE

## 2017-11-09 NOTE — Progress Notes (Signed)
Nursing leadership rounded on patient this morning to follow up on some safety concerns from staff. Patient was found sitting on the side of the bed asleep. He is currently on a low bed in the lowest position with mats on the floor. I shared safety concerns with the patient and offered to assist him to the chair where it would be safer if he were to fall asleep and he declined. Patient has on non-skid socks and his room door is open so he can be seen in passing. Bed alarm on. Nursing staff will continue to round and encourage the patient to participate in his safety plan.

## 2017-11-09 NOTE — Progress Notes (Signed)
[facility] Progress Note    Assessment/ Plan:    1  AKI on CKD stage IV--> ESRD - baseline creat 3.5- 4.2 as recently as Feb- Mar this year.  Not as big a response to lasix as hoped.  Preparing for dialysis, I think that the volume overload may be the event that tips him over the edge to ESRD.  Had several long talks with pt and he has been quite reluctant but ultimately is proceeding. S/p vein mapping and VVS c/s.  Scheduled for perm access after has been off Eliquis for a couple of days, planning for 4/22.  Appreciate IR with nontunneled HD cath 4/19 out of which he's dialyzing now.    2  Vol overload - acute on chronic, some of leg edema is prob d/t venous insufficiency.  +CHF on xray.  I've stopped the Lasix gtt and metolazone now that started dialysis.  3  HTN - on bidil, coreg.    4  Obesity- He would probably benefit from BiPaP/ CPAP at night- he reports he's intolerant but ? If some of his daytime sleepiness isn't just uremia but also hypercarbia  5  NICM/ sp ICD - EF 30-35%  6  AFib/ flutter - on amio/ BB/ eliquis on hold for access  7.  Dispo: CLIP in process  Subjective:    HD #1 today.  Tolerating well.  Sleeping on the machine.   Objective:   BP 118/67   Pulse 70   Temp 98.1 F (36.7 C) (Oral)   Resp (!) 30   Ht 5' 9.5" (1.765 m)   Wt (!) 145.2 kg (320 lb)   SpO2 93%   BMI 46.58 kg/m   Intake/Output Summary (Last 24 hours) at 11/09/2017 1518 Last data filed at 11/09/2017 1300 Gross per 24 hour  Intake 820 ml  Output 4400 ml  Net -3580 ml   Weight change: -4.853 kg (-10 lb 11.2 oz)  Physical Exam: Gen: sleeping on the machine HEENT:+ JVD to angle of mandible CVS: RRR soft systolic murmur Resp: diminished bilateral bases Abd: obese Ext: 3+ LE edema, largely unchanged  Imaging: Ir Fluoro Guide Cv Line Right  Result Date: 11/08/2017 INDICATION: 57 year old male in need of hemodialysis and venous access. He presents for placement of a  temporary hemodialysis catheter. EXAM: NONTUNNELED CENTRAL VENOUS HEMODIALYSIS CATHETER PLACEMENT WITH ULTRASOUND AND FLUOROSCOPIC GUIDANCE MEDICATIONS: None ANESTHESIA/SEDATION: None FLUOROSCOPY TIME:  Fluoroscopy Time: 0 minutes 18 seconds (6 mGy). COMPLICATIONS: None immediate. PROCEDURE: Informed written consent was obtained from the patient after a discussion of the risks, benefits, and alternatives to treatment. Questions regarding the procedure were encouraged and answered. The right neck and chest were prepped with chlorhexidine in a sterile fashion, and a sterile drape was applied covering the operative field. Maximum barrier sterile technique with sterile gowns and gloves were used for the procedure. A timeout was performed prior to the initiation of the procedure. After creating a small venotomy incision, a micropuncture kit was utilized to access the right internal jugular vein under direct, real-time ultrasound guidance after the overlying soft tissues were anesthetized with 1% lidocaine with epinephrine. Ultrasound image documentation was performed. The microwire was kinked to measure appropriate catheter length. A stiff Glidewire was advanced to the level of the IVC and the micropuncture sheath was exchanged for dilator. The skin entry site was dilated. A 20 cm Mahurkar Trialysis catheter was then advanced over the wire. The catheter was then positioned within the superior aspect of the right  atrium. Final catheter positioning was confirmed and documented with a spot radiographic image. The catheter aspirates and flushes normally. The catheter was flushed with appropriate volume heparin dwells. The catheter was secured with 0 Prolene suture. A sterile bandage was applied. The patient tolerated the procedure well without immediate post procedural complication. IMPRESSION: Successful placement of 20 cm Mahurkar non tunneled hemodialysis catheter via the right internal jugular vein with tips terminating  within the superior aspect of the right atrium. The catheter is ready for immediate use. Electronically Signed   By: Jacqulynn Cadet M.D.   On: 11/08/2017 18:26   Ir US Guide Vasc Access Right  Result Date: 11/08/2017 INDICATION: 57 year old male in need of hemodialysis and venous access. He presents for placement of a temporary hemodialysis catheter. EXAM: NONTUNNELED CENTRAL VENOUS HEMODIALYSIS CATHETER PLACEMENT WITH ULTRASOUND AND FLUOROSCOPIC GUIDANCE MEDICATIONS: None ANESTHESIA/SEDATION: None FLUOROSCOPY TIME:  Fluoroscopy Time: 0 minutes 18 seconds (6 mGy). COMPLICATIONS: None immediate. PROCEDURE: Informed written consent was obtained from the patient after a discussion of the risks, benefits, and alternatives to treatment. Questions regarding the procedure were encouraged and answered. The right neck and chest were prepped with chlorhexidine in a sterile fashion, and a sterile drape was applied covering the operative field. Maximum barrier sterile technique with sterile gowns and gloves were used for the procedure. A timeout was performed prior to the initiation of the procedure. After creating a small venotomy incision, a micropuncture kit was utilized to access the right internal jugular vein under direct, real-time ultrasound guidance after the overlying soft tissues were anesthetized with 1% lidocaine with epinephrine. Ultrasound image documentation was performed. The microwire was kinked to measure appropriate catheter length. A stiff Glidewire was advanced to the level of the IVC and the micropuncture sheath was exchanged for dilator. The skin entry site was dilated. A 20 cm Mahurkar Trialysis catheter was then advanced over the wire. The catheter was then positioned within the superior aspect of the right atrium. Final catheter positioning was confirmed and documented with a spot radiographic image. The catheter aspirates and flushes normally. The catheter was flushed with appropriate volume  heparin dwells. The catheter was secured with 0 Prolene suture. A sterile bandage was applied. The patient tolerated the procedure well without immediate post procedural complication. IMPRESSION: Successful placement of 20 cm Mahurkar non tunneled hemodialysis catheter via the right internal jugular vein with tips terminating within the superior aspect of the right atrium. The catheter is ready for immediate use. Electronically Signed   By: Jacqulynn Cadet M.D.   On: 11/08/2017 18:26    Labs: BMET Recent Labs  Lab 11/04/17 0329 11/05/17 0530 11/06/17 0713 11/07/17 0502 11/08/17 0407 11/08/17 1402 11/09/17 0034  NA 139 137 138 136 140 139 139  K 4.1 4.5 4.9 4.5 3.7 3.4* 3.5  CL 97* 95* 98* 98* 97* 93* 92*  CO2 '30 29 27 26 29 30 28  '$ GLUCOSE 114* 176* 124* 117* 142* 128* 161*  BUN 67* 74* 87* 98* 106* 105* 111*  CREATININE 4.00* 4.42* 5.13* 5.31* 5.14* 4.87* 4.76*  CALCIUM 9.0 8.9 9.1 9.0 8.9 8.9 9.0  PHOS  --   --   --   --   --  6.2*  --    CBC Recent Labs  Lab 11/06/17 0713 11/07/17 0502 11/08/17 0407 11/08/17 1402 11/09/17 0034  WBC 13.0* 11.7* 10.7* 11.0* 11.1*  NEUTROABS 10.1* 8.4* 7.9*  --  8.5*  HGB 10.7* 10.8* 10.5* 10.5* 10.7*  HCT 35.4* 35.7* 35.3*  34.9* 36.6*  MCV 88.7 87.5 88.9 88.4 88.6  PLT 216 224 247 244 253    Medications:    . allopurinol  100 mg Oral BID  . amiodarone  200 mg Oral Daily  . apixaban  5 mg Oral BID  . carvedilol  6.25 mg Oral BID WC  . isosorbide-hydrALAZINE  1 tablet Oral TID  . potassium chloride SA  40 mEq Oral TID  . sodium chloride flush  3 mL Intravenous Q12H      Madelon Lips, MD [facility] pgr [phone] 11/09/2017, 3:18 PM

## 2017-11-09 NOTE — Progress Notes (Addendum)
Patient ID: Eric Murillo, male   DOB: Dec 11, 1960, 57 y.o.   MRN: [id number]     Advanced Heart Failure Rounding Note  PCP-Cardiologist: Loralie Champagne, MD   Subjective:    Remains on lasix drip and metolazone. Brisk diuresis noted.   Plan for HD today.   Complaining of fatigue. Denies SOB. Having a hard time sleeping.     Objective:   Weight Range: (!) 320 lb (145.2 kg) Body mass index is 46.58 kg/m.   Vital Signs:   Temp:  [97.4 F (36.3 C)-99 F (37.2 C)] 98.8 F (37.1 C) (04/19 0412) Pulse Rate:  [68-81] 81 (04/19 0412) Resp:  [18-20] 18 (04/19 0412) BP: (107-145)/(52-85) 145/85 (04/19 0412) SpO2:  [91 %-96 %] 95 % (04/19 0412) Weight:  [320 lb (145.2 kg)] 320 lb (145.2 kg) (04/19 0412) Last BM Date: 11/08/17  Weight change: Filed Weights   11/07/17 0415 11/08/17 0413 11/09/17 0412  Weight: (!) 331 lb 9.6 oz (150.4 kg) (!) 330 lb 11.2 oz (150 kg) (!) 320 lb (145.2 kg)    Intake/Output:   Intake/Output Summary (Last 24 hours) at 11/09/2017 1109 Last data filed at 11/09/2017 1059 Gross per 24 hour  Intake 820 ml  Output 4975 ml  Net -4155 ml      Physical Exam    General:  Sitting on the side of the bed.  No resp difficulty HEENT: normal Neck: supple. JVP to ear Carotids 2+ bilat; no bruits. No lymphadenopathy or thryomegaly appreciated. RIJ HD catheter.  Cor: PMI nondisplaced. Regular rate & rhythm. No rubs, gallops or murmurs. Lungs: clear Abdomen:obese, soft, nontender, nondistended. No hepatosplenomegaly. No bruits or masses. Good bowel sounds. Extremities: no cyanosis, clubbing, rash, R and LLE 2+ edema Neuro: alert & orientedx3, cranial nerves grossly intact. moves all 4 extremities w/o difficulty. Affect pleasant   Telemetry   NSR 70s personally reviewed.   Labs    CBC Recent Labs    11/08/17 0407 11/08/17 1402 11/09/17 0034  WBC 10.7* 11.0* 11.1*  NEUTROABS 7.9*  --  8.5*  HGB 10.5* 10.5* 10.7*  HCT 35.3* 34.9* 36.6*  MCV 88.9 88.4  88.6  PLT 247 244 409   Basic Metabolic Panel Recent Labs    11/08/17 1402 11/09/17 0034  NA 139 139  K 3.4* 3.5  CL 93* 92*  CO2 30 28  GLUCOSE 128* 161*  BUN 105* 111*  CREATININE 4.87* 4.76*  CALCIUM 8.9 9.0  PHOS 6.2*  --    Liver Function Tests Recent Labs    11/08/17 1402  ALBUMIN 3.6   No results for input(s): LIPASE, AMYLASE in the last 72 hours. Cardiac Enzymes No results for input(s): CKTOTAL, CKMB, CKMBINDEX, TROPONINI in the last 72 hours.  BNP: BNP (last 3 results) Recent Labs    06/05/17 0301 07/26/17 0916 11/03/17 1238  BNP 672.6* 399.7* 1,160.1*    ProBNP (last 3 results) No results for input(s): PROBNP in the last 8760 hours.   D-Dimer No results for input(s): DDIMER in the last 72 hours. Hemoglobin A1C No results for input(s): HGBA1C in the last 72 hours. Fasting Lipid Panel No results for input(s): CHOL, HDL, LDLCALC, TRIG, CHOLHDL, LDLDIRECT in the last 72 hours. Thyroid Function Tests No results for input(s): TSH, T4TOTAL, T3FREE, THYROIDAB in the last 72 hours.  Invalid input(s): FREET3  Other results:   Imaging    Ir Fluoro Guide Cv Line Right  Result Date: 11/08/2017 INDICATION: 57 year old male in need of hemodialysis and venous access. He  presents for placement of a temporary hemodialysis catheter. EXAM: NONTUNNELED CENTRAL VENOUS HEMODIALYSIS CATHETER PLACEMENT WITH ULTRASOUND AND FLUOROSCOPIC GUIDANCE MEDICATIONS: None ANESTHESIA/SEDATION: None FLUOROSCOPY TIME:  Fluoroscopy Time: 0 minutes 18 seconds (6 mGy). COMPLICATIONS: None immediate. PROCEDURE: Informed written consent was obtained from the patient after a discussion of the risks, benefits, and alternatives to treatment. Questions regarding the procedure were encouraged and answered. The right neck and chest were prepped with chlorhexidine in a sterile fashion, and a sterile drape was applied covering the operative field. Maximum barrier sterile technique with sterile  gowns and gloves were used for the procedure. A timeout was performed prior to the initiation of the procedure. After creating a small venotomy incision, a micropuncture kit was utilized to access the right internal jugular vein under direct, real-time ultrasound guidance after the overlying soft tissues were anesthetized with 1% lidocaine with epinephrine. Ultrasound image documentation was performed. The microwire was kinked to measure appropriate catheter length. A stiff Glidewire was advanced to the level of the IVC and the micropuncture sheath was exchanged for dilator. The skin entry site was dilated. A 20 cm Mahurkar Trialysis catheter was then advanced over the wire. The catheter was then positioned within the superior aspect of the right atrium. Final catheter positioning was confirmed and documented with a spot radiographic image. The catheter aspirates and flushes normally. The catheter was flushed with appropriate volume heparin dwells. The catheter was secured with 0 Prolene suture. A sterile bandage was applied. The patient tolerated the procedure well without immediate post procedural complication. IMPRESSION: Successful placement of 20 cm Mahurkar non tunneled hemodialysis catheter via the right internal jugular vein with tips terminating within the superior aspect of the right atrium. The catheter is ready for immediate use. Electronically Signed   By: Jacqulynn Cadet M.D.   On: 11/08/2017 18:26   Ir US Guide Vasc Access Right  Result Date: 11/08/2017 INDICATION: 57 year old male in need of hemodialysis and venous access. He presents for placement of a temporary hemodialysis catheter. EXAM: NONTUNNELED CENTRAL VENOUS HEMODIALYSIS CATHETER PLACEMENT WITH ULTRASOUND AND FLUOROSCOPIC GUIDANCE MEDICATIONS: None ANESTHESIA/SEDATION: None FLUOROSCOPY TIME:  Fluoroscopy Time: 0 minutes 18 seconds (6 mGy). COMPLICATIONS: None immediate. PROCEDURE: Informed written consent was obtained from the patient  after a discussion of the risks, benefits, and alternatives to treatment. Questions regarding the procedure were encouraged and answered. The right neck and chest were prepped with chlorhexidine in a sterile fashion, and a sterile drape was applied covering the operative field. Maximum barrier sterile technique with sterile gowns and gloves were used for the procedure. A timeout was performed prior to the initiation of the procedure. After creating a small venotomy incision, a micropuncture kit was utilized to access the right internal jugular vein under direct, real-time ultrasound guidance after the overlying soft tissues were anesthetized with 1% lidocaine with epinephrine. Ultrasound image documentation was performed. The microwire was kinked to measure appropriate catheter length. A stiff Glidewire was advanced to the level of the IVC and the micropuncture sheath was exchanged for dilator. The skin entry site was dilated. A 20 cm Mahurkar Trialysis catheter was then advanced over the wire. The catheter was then positioned within the superior aspect of the right atrium. Final catheter positioning was confirmed and documented with a spot radiographic image. The catheter aspirates and flushes normally. The catheter was flushed with appropriate volume heparin dwells. The catheter was secured with 0 Prolene suture. A sterile bandage was applied. The patient tolerated the procedure well without immediate post  procedural complication. IMPRESSION: Successful placement of 20 cm Mahurkar non tunneled hemodialysis catheter via the right internal jugular vein with tips terminating within the superior aspect of the right atrium. The catheter is ready for immediate use. Electronically Signed   By: Jacqulynn Cadet M.D.   On: 11/08/2017 18:26     Medications:     Scheduled Medications: . allopurinol  100 mg Oral BID  . amiodarone  200 mg Oral Daily  . apixaban  5 mg Oral BID  . carvedilol  6.25 mg Oral BID WC  .  isosorbide-hydrALAZINE  1 tablet Oral TID  . metolazone  5 mg Oral BID  . potassium chloride SA  40 mEq Oral TID  . sodium chloride flush  3 mL Intravenous Q12H    Infusions: . sodium chloride    . sodium chloride    . sodium chloride    . furosemide (LASIX) infusion 20 mg/hr (11/09/17 0259)    PRN Medications: sodium chloride, sodium chloride, sodium chloride, acetaminophen, acetaminophen, alteplase, heparin, HYDROcodone-homatropine, lidocaine (PF), lidocaine (PF), lidocaine-prilocaine, ondansetron (ZOFRAN) IV, pentafluoroprop-tetrafluoroeth, sodium chloride flush  Assessment/Plan   1. Acute on chronic systolic CHF: Nonischemic cardiomyopathy x years. Last echo in 11/18 with moderate LV dilation, EF 20-25%, mildly decreased RV systolic function.  He had a workup for cardiac amyloidosis in the past.  However, negative SPEP and abdominal fat pad biopsy negative. Low voltage on ECG may be due to obesity and not amyloidosis. CKD stage IV makes management of volume difficult.  Brisk diuresis noted.  - - Continue current regimen with Lasix gtt 20 mg/hr and metolazone to 5 mg bid.  - Continue current Coreg and Bidil doses, BP stable.    - No ACEI/ARB/ARNI/spironolactone with CKD IV.  - Planning HD today. 2. Atypical atrial flutter: Recurrent. Most recent DCCV in 11/18.  Saw Dr Curt Bears, not thought to be a good ablation candidate.  -Maintaining NSR    - Continue amiodarone and apixaban.  He has not missed apixaban doses.  3. AKI on CKD stage IV: BUN/creatinine have been rising. Vein mapping done and VVS consulted.  Plan for iHD today.  Nephrology appreciated.  4. OSA: Has been unable to tolerate CPAP.  5. Morbid obesity-Body mass index is 46.58 kg/m.  Length of Stay: 5  Amy Clegg, NP  11/09/2017, 11:09 AM  Advanced Heart Failure Team Pager [phone] (M-F; 7a - 4p)  Please contact [facility] for night-coverage after hours (4p -7a ) and weekends on [email or web address]  Patient seen with  NP, agree with the above note.  UOP picked up with diuretics yesterday but BUN still very high.  He started HD today.  He is still very volume overloaded and needs a lot more fluid off.  Remains to be seen whether HD will be permanent, but I am concerned that it will be.  He remains in NSR.   Loralie Champagne 11/09/2017 1:52 PM

## 2017-11-09 NOTE — Progress Notes (Signed)
Consent obtained and signed per order. Patient educated on plan for today. Verbalized understanding. Still continues to be non compliant with safety measure. Refuses to sit in chair or lay in bed. RN has been in patient's room continuously throughput shift.

## 2017-11-09 NOTE — Procedures (Signed)
Patient seen and examined on Hemodialysis. QB 250 mL/ min, UF goal 2L  HD #1.  Tolerating rx well.  Treatment adjusted as needed.  Bufford Buttner MD [facility] pgr [phone] 3:28 PM

## 2017-11-09 NOTE — Progress Notes (Signed)
Physical Therapy Treatment Patient Details Name: Eric Murillo MRN: [id number] DOB: 07/30/1960 Today's Date: 11/09/2017    History of Present Illness Pt is a 57 y/o male who presented to the ED with cough and increased SOB, found to be volume overloaded and in A-flutter with controlled ventricular rate in the ED. Note hx of TEE/cardioversion in 2015, 2017, and 2018. Diagnosed with acute on chronic heart failure, A-flutter. PMH AICD placement, OA, A-flutter, CHF, CKD, gout, HTN, morbid obesity, hx cardiac cath and cardioversion, hx TEE     PT Comments    Pt limited this session secondary to nausea. Pt stated that he is supposed to have a procedure done today and has not been able to eat anything. Pt declining ambulation this session but tolerated sit<>stand from EOB with supervision for safety without use of an AD. PT will continue to follow acutely to progress mobility as tolerated.     Follow Up Recommendations  Outpatient PT     Equipment Recommendations  None recommended by PT    Recommendations for Other Services       Precautions / Restrictions Precautions Precautions: Fall Restrictions Weight Bearing Restrictions: No    Mobility  Bed Mobility               General bed mobility comments: pt sitting EOB upon arrival   Transfers Overall transfer level: Needs assistance Equipment used: None Transfers: Sit to/from Stand Sit to Stand: Supervision         General transfer comment: supervision for safety, no use of an AD  Ambulation/Gait             General Gait Details: pt declining further mobility secondary to nausea   Stairs             Wheelchair Mobility    Modified Rankin (Stroke Patients Only)       Balance Overall balance assessment: Needs assistance Sitting-balance support: Feet supported Sitting balance-Leahy Scale: Good     Standing balance support: No upper extremity supported;During functional activity Standing  balance-Leahy Scale: Fair                              Cognition Arousal/Alertness: Lethargic Behavior During Therapy: Flat affect Overall Cognitive Status: Within Functional Limits for tasks assessed                                        Exercises Other Exercises Other Exercises: PT demonstrated and instructed pt in bilateral LE exercises to assist with decreasing edema (including ankle pumps and LAQs) - pt not performing secondary to nausea    General Comments        Pertinent Vitals/Pain Pain Assessment: No/denies pain    Home Living                      Prior Function            PT Goals (current goals can now be found in the care plan section) Acute Rehab PT Goals PT Goal Formulation: With patient Time For Goal Achievement: 11/21/17 Potential to Achieve Goals: Good Progress towards PT goals: Progressing toward goals    Frequency    Min 3X/week      PT Plan Current plan remains appropriate    Co-evaluation  AM-PAC PT "6 Clicks" Daily Activity  Outcome Measure  Difficulty turning over in bed (including adjusting bedclothes, sheets and blankets)?: None Difficulty moving from lying on back to sitting on the side of the bed? : None Difficulty sitting down on and standing up from a chair with arms (e.g., wheelchair, bedside commode, etc,.)?: None Help needed moving to and from a bed to chair (including a wheelchair)?: A Little Help needed walking in hospital room?: A Little Help needed climbing 3-5 steps with a railing? : A Lot 6 Click Score: 20    End of Session   Activity Tolerance: Patient limited by lethargy;Other (comment)(pt limited secondary to nausea) Patient left: in bed;with call bell/phone within reach;Other (comment)(sitting EOB) Nurse Communication: Mobility status PT Visit Diagnosis: Unsteadiness on feet (R26.81);Muscle weakness (generalized) (M62.81);Difficulty in walking, not elsewhere  classified (R26.2)     Time: 1610-9604 PT Time Calculation (min) (ACUTE ONLY): 15 min  Charges:  $Therapeutic Activity: 8-22 mins                    G Codes:       [place], [place], [place] [phone]    Alessandra Bevels Eboney Claybrook 11/09/2017, 10:41 AM

## 2017-11-09 NOTE — Progress Notes (Signed)
Patient refusing to get off the floor in the praying position. States this is how he sleeps at home. Patient states MD told him today he could sleep like this if he had to.  Patient got on his knees himself. Refusing to get up. Patient is alert and oriented. Currently sleeping in the praying position. Charge RN and primary RN at bedside to encourage patient to get in bed. Patient refusing. Extra floor mats placed for safety, Patients door left open in order for staff to check on patient frequently. Will continue to round on patient during the night. No other complaints at this time.

## 2017-11-10 LAB — CBC WITH DIFFERENTIAL/PLATELET
Basophils Absolute: 0 10*3/uL (ref 0.0–0.1)
Basophils Relative: 0 %
Eosinophils Absolute: 0.1 10*3/uL (ref 0.0–0.7)
Eosinophils Relative: 1 %
HCT: 35.5 % — ABNORMAL LOW (ref 39.0–52.0)
Hemoglobin: 10.4 g/dL — ABNORMAL LOW (ref 13.0–17.0)
Lymphocytes Relative: 18 %
Lymphs Abs: 2.4 10*3/uL (ref 0.7–4.0)
MCH: 26.1 pg (ref 26.0–34.0)
MCHC: 29.3 g/dL — ABNORMAL LOW (ref 30.0–36.0)
MCV: 89 fL (ref 78.0–100.0)
Monocytes Absolute: 1 10*3/uL (ref 0.1–1.0)
Monocytes Relative: 7 %
Neutro Abs: 10.1 10*3/uL — ABNORMAL HIGH (ref 1.7–7.7)
Neutrophils Relative %: 74 %
Platelets: 241 10*3/uL (ref 150–400)
RBC: 3.99 MIL/uL — ABNORMAL LOW (ref 4.22–5.81)
RDW: 16.1 % — ABNORMAL HIGH (ref 11.5–15.5)
WBC: 13.6 10*3/uL — ABNORMAL HIGH (ref 4.0–10.5)

## 2017-11-10 LAB — RENAL FUNCTION PANEL
ALBUMIN: 3.4 g/dL — AB (ref 3.5–5.0)
Anion gap: 14 (ref 5–15)
BUN: 69 mg/dL — AB (ref 6–20)
CALCIUM: 8.5 mg/dL — AB (ref 8.9–10.3)
CO2: 28 mmol/L (ref 22–32)
Chloride: 95 mmol/L — ABNORMAL LOW (ref 101–111)
Creatinine, Ser: 3.5 mg/dL — ABNORMAL HIGH (ref 0.61–1.24)
GFR calc Af Amer: 21 mL/min — ABNORMAL LOW (ref >60)
GFR calc non Af Amer: 18 mL/min — ABNORMAL LOW (ref >60)
Glucose, Bld: 126 mg/dL — ABNORMAL HIGH (ref 65–99)
PHOSPHORUS: 3.3 mg/dL (ref 2.5–4.6)
Potassium: 3.7 mmol/L (ref 3.5–5.1)
SODIUM: 137 mmol/L (ref 135–145)

## 2017-11-10 LAB — HEPATITIS B SURFACE ANTIBODY,QUALITATIVE: HEP B S AB: NONREACTIVE

## 2017-11-10 LAB — HEPATITIS B CORE ANTIBODY, TOTAL: HEP B C TOTAL AB: NEGATIVE

## 2017-11-10 MED ORDER — CEFAZOLIN SODIUM-DEXTROSE 1-4 GM/50ML-% IV SOLN
1.0000 g | INTRAVENOUS | Status: AC
  Administered 2017-11-12: 1 g via INTRAVENOUS
  Filled 2017-11-10: qty 50

## 2017-11-10 NOTE — Progress Notes (Signed)
Pt is sitting up in a chair pretty much whole day , vitals stable, waiting for HD this evening, will continue to monitor the patient  Lonia Farber, RN

## 2017-11-10 NOTE — Progress Notes (Signed)
Progress Note  Patient Name: Eric Murillo Date of Encounter: 11/10/2017  Primary Cardiologist: Loralie Champagne, MD   Subjective   SOB improving.   Inpatient Medications    Scheduled Meds: . allopurinol  100 mg Oral BID  . amiodarone  200 mg Oral Daily  . carvedilol  6.25 mg Oral BID WC  . isosorbide-hydrALAZINE  1 tablet Oral TID  . potassium chloride SA  40 mEq Oral TID  . sodium chloride flush  3 mL Intravenous Q12H   Continuous Infusions: . sodium chloride    . sodium chloride    . sodium chloride    . [START ON 11/12/2017]  ceFAZolin (ANCEF) IV     PRN Meds: sodium chloride, sodium chloride, sodium chloride, acetaminophen, acetaminophen, alteplase, heparin, HYDROcodone-homatropine, lidocaine (PF), lidocaine (PF), lidocaine-prilocaine, ondansetron (ZOFRAN) IV, pentafluoroprop-tetrafluoroeth, sodium chloride flush   Vital Signs    Vitals:   11/09/17 2021 11/10/17 0501 11/10/17 0503 11/10/17 1125  BP: 136/84 122/66 122/66 105/65  Pulse: 66 79 63 62  Resp: _0 Temp: 98.4 F (36.9 C) 98.5 F (36.9 C) 98.5 F (36.9 C) 98.4 F (36.9 C)  TempSrc: Oral Oral Oral Oral  SpO2: 92%  (!) 88% 93%  Weight:  (!) 309 lb 3.2 oz (140.3 kg)    Height:        Intake/Output Summary (Last 24 hours) at 11/10/2017 1206 Last data filed at 11/10/2017 0900 Gross per 24 hour  Intake 240 ml  Output 2375 ml  Net -2135 ml   Filed Weights   11/08/17 0413 11/09/17 0412 11/10/17 0501  Weight: (!) 330 lb 11.2 oz (150 kg) (!) 320 lb (145.2 kg) (!) 309 lb 3.2 oz (140.3 kg)    Telemetry    SR - Personally Reviewed  ECG    na  Physical Exam   GEN: No acute distress.   Neck: elevated JVD Cardiac: RRR, no murmurs, rubs, or gallops.  Respiratory: Clear to auscultation bilaterally. GI: Soft, nontender, non-distended  MS: 2+ bilateral edema; No deformity. Neuro:  Nonfocal  Psych: Normal affect   Labs    Chemistry Recent Labs  Lab 11/03/17 1237  11/08/17 0407  11/08/17 1402 11/09/17 0034  NA 139   < > 140 139 139  K 3.7   < > 3.7 3.4* 3.5  CL 96*   < > 97* 93* 92*  CO2 29   < > _1 GLUCOSE 106*   < > 142* 128* 161*  BUN 63*   < > 106* 105* 111*  CREATININE 3.77*   < > 5.14* 4.87* 4.76*  CALCIUM 8.9   < > 8.9 8.9 9.0  PROT 7.1  --   --   --   --   ALBUMIN 3.7  --   --  3.6  --   AST 36  --   --   --   --   ALT 22  --   --   --   --   ALKPHOS 155*  --   --   --   --   BILITOT 1.7*  --   --   --   --   GFRNONAA 16*   < > 11* 12* 12*  GFRAA 19*   < > 13* 14* 14*  ANIONGAP 14   < > 14 16* 19*   < > = values in this interval not displayed.     Hematology Recent Labs  Lab 11/08/17 1402 11/09/17  0034 11/10/17 0558  WBC 11.0* 11.1* 13.6*  RBC 3.95* 4.13* 3.99*  HGB 10.5* 10.7* 10.4*  HCT 34.9* 36.6* 35.5*  MCV 88.4 88.6 89.0  MCH 26.6 25.9* 26.1  MCHC 30.1 29.2* 29.3*  RDW 16.1* 16.1* 16.1*  PLT 244 253 241    Cardiac Enzymes Recent Labs  Lab 11/03/17 1237  TROPONINI 0.08*    Recent Labs  Lab 11/03/17 1301  TROPIPOC 0.09*     BNP Recent Labs  Lab 11/03/17 1238  BNP 1,160.1*     DDimer No results for input(s): DDIMER in the last 168 hours.   Radiology    Ir Cyndy Freeze Guide Cv Line Right  Result Date: 11/08/2017 INDICATION: 57 year old male in need of hemodialysis and venous access. He presents for placement of a temporary hemodialysis catheter. EXAM: NONTUNNELED CENTRAL VENOUS HEMODIALYSIS CATHETER PLACEMENT WITH ULTRASOUND AND FLUOROSCOPIC GUIDANCE MEDICATIONS: None ANESTHESIA/SEDATION: None FLUOROSCOPY TIME:  Fluoroscopy Time: 0 minutes 18 seconds (6 mGy). COMPLICATIONS: None immediate. PROCEDURE: Informed written consent was obtained from the patient after a discussion of the risks, benefits, and alternatives to treatment. Questions regarding the procedure were encouraged and answered. The right neck and chest were prepped with chlorhexidine in a sterile fashion, and a sterile drape was applied covering the  operative field. Maximum barrier sterile technique with sterile gowns and gloves were used for the procedure. A timeout was performed prior to the initiation of the procedure. After creating a small venotomy incision, a micropuncture kit was utilized to access the right internal jugular vein under direct, real-time ultrasound guidance after the overlying soft tissues were anesthetized with 1% lidocaine with epinephrine. Ultrasound image documentation was performed. The microwire was kinked to measure appropriate catheter length. A stiff Glidewire was advanced to the level of the IVC and the micropuncture sheath was exchanged for dilator. The skin entry site was dilated. A 20 cm Mahurkar Trialysis catheter was then advanced over the wire. The catheter was then positioned within the superior aspect of the right atrium. Final catheter positioning was confirmed and documented with a spot radiographic image. The catheter aspirates and flushes normally. The catheter was flushed with appropriate volume heparin dwells. The catheter was secured with 0 Prolene suture. A sterile bandage was applied. The patient tolerated the procedure well without immediate post procedural complication. IMPRESSION: Successful placement of 20 cm Mahurkar non tunneled hemodialysis catheter via the right internal jugular vein with tips terminating within the superior aspect of the right atrium. The catheter is ready for immediate use. Electronically Signed   By: Jacqulynn Cadet M.D.   On: 11/08/2017 18:26   Ir US Guide Vasc Access Right  Result Date: 11/08/2017 INDICATION: 57 year old male in need of hemodialysis and venous access. He presents for placement of a temporary hemodialysis catheter. EXAM: NONTUNNELED CENTRAL VENOUS HEMODIALYSIS CATHETER PLACEMENT WITH ULTRASOUND AND FLUOROSCOPIC GUIDANCE MEDICATIONS: None ANESTHESIA/SEDATION: None FLUOROSCOPY TIME:  Fluoroscopy Time: 0 minutes 18 seconds (6 mGy). COMPLICATIONS: None immediate.  PROCEDURE: Informed written consent was obtained from the patient after a discussion of the risks, benefits, and alternatives to treatment. Questions regarding the procedure were encouraged and answered. The right neck and chest were prepped with chlorhexidine in a sterile fashion, and a sterile drape was applied covering the operative field. Maximum barrier sterile technique with sterile gowns and gloves were used for the procedure. A timeout was performed prior to the initiation of the procedure. After creating a small venotomy incision, a micropuncture kit was utilized to access the right internal jugular vein  under direct, real-time ultrasound guidance after the overlying soft tissues were anesthetized with 1% lidocaine with epinephrine. Ultrasound image documentation was performed. The microwire was kinked to measure appropriate catheter length. A stiff Glidewire was advanced to the level of the IVC and the micropuncture sheath was exchanged for dilator. The skin entry site was dilated. A 20 cm Mahurkar Trialysis catheter was then advanced over the wire. The catheter was then positioned within the superior aspect of the right atrium. Final catheter positioning was confirmed and documented with a spot radiographic image. The catheter aspirates and flushes normally. The catheter was flushed with appropriate volume heparin dwells. The catheter was secured with 0 Prolene suture. A sterile bandage was applied. The patient tolerated the procedure well without immediate post procedural complication. IMPRESSION: Successful placement of 20 cm Mahurkar non tunneled hemodialysis catheter via the right internal jugular vein with tips terminating within the superior aspect of the right atrium. The catheter is ready for immediate use. Electronically Signed   By: Jacqulynn Cadet M.D.   On: 11/08/2017 18:26    Cardiac Studies    Patient Profile      Assessment & Plan    1. Acute on chronic systolic HF -  Nonischemic cardiomyopathy x years. Last echo in 11/18 with moderate LV dilation, EF 20-25%, mildly decreased RV systolic function. He had a workup for cardiac amyloidosis in the past. However, negative SPEP and abdominal fat pad biopsy negative. 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 Temp HD access placed with plans for permanent access once off eliquis longer, started on HD. Diuretics stopped  - once committed and stable on long term HD can consider ACE/ARB/ARNI/aldactone  2. Aflutter - Recurrent. Most recent DCCV in 11/18. Saw Dr Curt Bears, not thought to be a good ablation candidate.  -Maintaining NSR    - Continue amiodarone and apixaban. He has not missed apixaban doses.     3. AKI on CKD IV - renal following,  - Temp HD access placed with plans for permanent access once off eliquis longer, started on HD.   For questions or updates, please contact [place] Please consult [email or web address] for contact info under Cardiology/STEMI.      Merrily Pew, MD  11/10/2017, 12:06 PM

## 2017-11-10 NOTE — Progress Notes (Signed)
Patient staying in bed @ times with numerous prompts. Most times refusing to get in bed or recliner while sleeping @ edge of bed.Will continue to monitor patient closely.

## 2017-11-10 NOTE — Progress Notes (Signed)
Patient arrived to unit by bed.  Reviewed treatment plan and this RN agrees with plan.  Report received from bedside RN, Lonia Farber.  Consent verbalized understanding.  Patient A & O X 4.   Lung sounds diminished and coarse to ausculation in all fields. BLE 3+ pitting edema. Cardiac:  NSR, BBB.  Removed caps and cleansed RIJ catheter with chlorhedxidine.  Aspirated ports of heparin and flushed them with saline per protocol.  Connected and secured lines, initiated treatment at 1815.  UF Goal of 2500 mL and net fluid removal 2 L.  Will continue to monitor.

## 2017-11-10 NOTE — Progress Notes (Signed)
Dialysis treatment completed.  2500 mL ultrafiltrated.  2000 mL net fluid removal.  Patient status unchanged. Lung sounds diminished to ausculation in all fields. BLE 3+ pitting edema. Cardiac: SB.  Cleansed RIJ catheter with chlorhexidine.  Disconnected lines and flushed ports with saline per protocol.  Ports locked with heparin and capped per protocol.    Report given to bedside, RN Francisca.

## 2017-11-10 NOTE — Progress Notes (Signed)
While rounding on pt, pt found that sleeping on the side of the bed on his knees on the mat. Pt's girlfriend is in bed side and she said that this is his normal position since 3 years and MD saw him on this position in rounding too and completely fine with this, made charge nurse aware too. Same time pt has a bed alarm on, yellow socks on, pt looks very comfortable on this position, RN made patient and family member aware about fall and safety. Pt door is open  And continue to monitor and round on patient.  Lonia Farber, RN

## 2017-11-10 NOTE — Progress Notes (Signed)
Pt left for HD, report provided to the HD nurse  Lonia Farber

## 2017-11-10 NOTE — Progress Notes (Signed)
[facility] Progress Note    Assessment/ Plan:    1  AKI on CKD stage IV--> ESRD - baseline creat 3.5- 4.2 as recently as Feb- Mar this year.  Not as big a response to lasix as hoped.  I think that the volume overload may be the event that tips him over the edge to ESRD.  Had several long talks with pt and he has been quite reluctant but ultimately is proceeding. S/p vein mapping and VVS c/s.  Scheduled for perm access after has been off Eliquis for a couple of days, planning for 4/22.  Appreciate IR with nontunneled HD cath 4/19 out of which he's dialyzing now.  HD #2 today 4/20.    2  Vol overload - acute on chronic, some of leg edema is prob d/t venous insufficiency.  +CHF on xray.  I've stopped the Lasix gtt and metolazone now that started dialysis.  3  HTN - on bidil, coreg.    4  Obesity- He would probably benefit from BiPaP/ CPAP at night- he reports he's intolerant but ? If some of his daytime sleepiness isn't just uremia but also hypercarbia  5  NICM/ sp ICD - EF 30-35%  6  AFib/ flutter - on amio/ BB/ eliquis on hold for access  7.  Dispo: CLIP in process  Subjective:    HD #2 today.  Looks better already- less sleepy   Objective:   BP 105/65 (BP Location: Right Arm)   Pulse 62   Temp 98.4 F (36.9 C) (Oral)   Resp 18   Ht 5' 9.5" (1.765 m)   Wt (!) 140.3 kg (309 lb 3.2 oz)   SpO2 93%   BMI 45.01 kg/m   Intake/Output Summary (Last 24 hours) at 11/10/2017 1158 Last data filed at 11/10/2017 0900 Gross per 24 hour  Intake 240 ml  Output 2375 ml  Net -2135 ml   Weight change: -4.899 kg (-10 lb 12.8 oz)  Physical Exam: Gen: sitting in chair, SO at bedside HEENT:+ JVD to angle of mandible CVS: RRR soft systolic murmur Resp: diminished bilateral bases, slightly improved Abd: obese Ext: 3+ LE edema, largely unchanged, Unna boots  Imaging: Ir Fluoro Guide Cv Line Right  Result Date: 11/08/2017 INDICATION: 57 year old male in need of hemodialysis  and venous access. He presents for placement of a temporary hemodialysis catheter. EXAM: NONTUNNELED CENTRAL VENOUS HEMODIALYSIS CATHETER PLACEMENT WITH ULTRASOUND AND FLUOROSCOPIC GUIDANCE MEDICATIONS: None ANESTHESIA/SEDATION: None FLUOROSCOPY TIME:  Fluoroscopy Time: 0 minutes 18 seconds (6 mGy). COMPLICATIONS: None immediate. PROCEDURE: Informed written consent was obtained from the patient after a discussion of the risks, benefits, and alternatives to treatment. Questions regarding the procedure were encouraged and answered. The right neck and chest were prepped with chlorhexidine in a sterile fashion, and a sterile drape was applied covering the operative field. Maximum barrier sterile technique with sterile gowns and gloves were used for the procedure. A timeout was performed prior to the initiation of the procedure. After creating a small venotomy incision, a micropuncture kit was utilized to access the right internal jugular vein under direct, real-time ultrasound guidance after the overlying soft tissues were anesthetized with 1% lidocaine with epinephrine. Ultrasound image documentation was performed. The microwire was kinked to measure appropriate catheter length. A stiff Glidewire was advanced to the level of the IVC and the micropuncture sheath was exchanged for dilator. The skin entry site was dilated. A 20 cm Mahurkar Trialysis catheter was then advanced over the wire. The  catheter was then positioned within the superior aspect of the right atrium. Final catheter positioning was confirmed and documented with a spot radiographic image. The catheter aspirates and flushes normally. The catheter was flushed with appropriate volume heparin dwells. The catheter was secured with 0 Prolene suture. A sterile bandage was applied. The patient tolerated the procedure well without immediate post procedural complication. IMPRESSION: Successful placement of 20 cm Mahurkar non tunneled hemodialysis catheter via the  right internal jugular vein with tips terminating within the superior aspect of the right atrium. The catheter is ready for immediate use. Electronically Signed   By: Jacqulynn Cadet M.D.   On: 11/08/2017 18:26   Ir US Guide Vasc Access Right  Result Date: 11/08/2017 INDICATION: 57 year old male in need of hemodialysis and venous access. He presents for placement of a temporary hemodialysis catheter. EXAM: NONTUNNELED CENTRAL VENOUS HEMODIALYSIS CATHETER PLACEMENT WITH ULTRASOUND AND FLUOROSCOPIC GUIDANCE MEDICATIONS: None ANESTHESIA/SEDATION: None FLUOROSCOPY TIME:  Fluoroscopy Time: 0 minutes 18 seconds (6 mGy). COMPLICATIONS: None immediate. PROCEDURE: Informed written consent was obtained from the patient after a discussion of the risks, benefits, and alternatives to treatment. Questions regarding the procedure were encouraged and answered. The right neck and chest were prepped with chlorhexidine in a sterile fashion, and a sterile drape was applied covering the operative field. Maximum barrier sterile technique with sterile gowns and gloves were used for the procedure. A timeout was performed prior to the initiation of the procedure. After creating a small venotomy incision, a micropuncture kit was utilized to access the right internal jugular vein under direct, real-time ultrasound guidance after the overlying soft tissues were anesthetized with 1% lidocaine with epinephrine. Ultrasound image documentation was performed. The microwire was kinked to measure appropriate catheter length. A stiff Glidewire was advanced to the level of the IVC and the micropuncture sheath was exchanged for dilator. The skin entry site was dilated. A 20 cm Mahurkar Trialysis catheter was then advanced over the wire. The catheter was then positioned within the superior aspect of the right atrium. Final catheter positioning was confirmed and documented with a spot radiographic image. The catheter aspirates and flushes normally.  The catheter was flushed with appropriate volume heparin dwells. The catheter was secured with 0 Prolene suture. A sterile bandage was applied. The patient tolerated the procedure well without immediate post procedural complication. IMPRESSION: Successful placement of 20 cm Mahurkar non tunneled hemodialysis catheter via the right internal jugular vein with tips terminating within the superior aspect of the right atrium. The catheter is ready for immediate use. Electronically Signed   By: Jacqulynn Cadet M.D.   On: 11/08/2017 18:26    Labs: BMET Recent Labs  Lab 11/04/17 0329 11/05/17 0530 11/06/17 0713 11/07/17 0502 11/08/17 0407 11/08/17 1402 11/09/17 0034  NA 139 137 138 136 140 139 139  K 4.1 4.5 4.9 4.5 3.7 3.4* 3.5  CL 97* 95* 98* 98* 97* 93* 92*  CO2 _0 GLUCOSE 114* 176* 124* 117* 142* 128* 161*  BUN 67* 74* 87* 98* 106* 105* 111*  CREATININE 4.00* 4.42* 5.13* 5.31* 5.14* 4.87* 4.76*  CALCIUM 9.0 8.9 9.1 9.0 8.9 8.9 9.0  PHOS  --   --   --   --   --  6.2*  --    CBC Recent Labs  Lab 11/07/17 0502 11/08/17 0407 11/08/17 1402 11/09/17 0034 11/10/17 0558  WBC 11.7* 10.7* 11.0* 11.1* 13.6*  NEUTROABS 8.4* 7.9*  --  8.5* 10.1*  HGB 10.8* 10.5* 10.5* 10.7* 10.4*  HCT 35.7* 35.3* 34.9* 36.6* 35.5*  MCV 87.5 88.9 88.4 88.6 89.0  PLT 224 247 244 253 241    Medications:    . allopurinol  100 mg Oral BID  . amiodarone  200 mg Oral Daily  . carvedilol  6.25 mg Oral BID WC  . isosorbide-hydrALAZINE  1 tablet Oral TID  . potassium chloride SA  40 mEq Oral TID  . sodium chloride flush  3 mL Intravenous Q12H      Madelon Lips, MD [facility] pgr [phone] 11/10/2017, 11:58 AM

## 2017-11-10 NOTE — Plan of Care (Signed)
  Problem: Nutrition: Goal: Adequate nutrition will be maintained Outcome: Completed/Met   Problem: Coping: Goal: Level of anxiety will decrease Outcome: Completed/Met   Problem: Elimination: Goal: Will not experience complications related to bowel motility Outcome: Completed/Met   Problem: Pain Managment: Goal: General experience of comfort will improve Outcome: Completed/Met   

## 2017-11-11 LAB — RENAL FUNCTION PANEL
Albumin: 3.7 g/dL (ref 3.5–5.0)
Anion gap: 12 (ref 5–15)
BUN: 60 mg/dL — AB (ref 6–20)
CALCIUM: 8.8 mg/dL — AB (ref 8.9–10.3)
CHLORIDE: 100 mmol/L — AB (ref 101–111)
CO2: 28 mmol/L (ref 22–32)
CREATININE: 3.81 mg/dL — AB (ref 0.61–1.24)
GFR calc non Af Amer: 16 mL/min — ABNORMAL LOW (ref >60)
GFR, EST AFRICAN AMERICAN: 19 mL/min — AB (ref >60)
GLUCOSE: 100 mg/dL — AB (ref 65–99)
Phosphorus: 3.3 mg/dL (ref 2.5–4.6)
Potassium: 4.9 mmol/L (ref 3.5–5.1)
SODIUM: 140 mmol/L (ref 135–145)

## 2017-11-11 LAB — CBC
HCT: 36.7 % — ABNORMAL LOW (ref 39.0–52.0)
Hemoglobin: 10.6 g/dL — ABNORMAL LOW (ref 13.0–17.0)
MCH: 25.7 pg — AB (ref 26.0–34.0)
MCHC: 28.9 g/dL — ABNORMAL LOW (ref 30.0–36.0)
MCV: 88.9 fL (ref 78.0–100.0)
PLATELETS: 213 10*3/uL (ref 150–400)
RBC: 4.13 MIL/uL — AB (ref 4.22–5.81)
RDW: 16 % — AB (ref 11.5–15.5)
WBC: 12.9 10*3/uL — ABNORMAL HIGH (ref 4.0–10.5)

## 2017-11-11 NOTE — Procedures (Signed)
Patient seen and examined on Hemodialysis. QB 400 mL/ min, UF goal 2L  HD #3.  Tolerating rx well.  Lots of volume to get off.  Treatment adjusted as needed.  Bufford Buttner MD [facility] pgr [phone] 11:45 AM

## 2017-11-11 NOTE — Anesthesia Preprocedure Evaluation (Addendum)
Anesthesia Evaluation  Patient identified by MRN, date of birth, ID band Patient awake    Reviewed: Allergy & Precautions, NPO status , Patient's Chart, lab work & pertinent test results, reviewed documented beta blocker date and time   History of Anesthesia Complications Negative for: history of anesthetic complications  Airway Mallampati: II  TM Distance: >3 FB Neck ROM: Full    Dental  (+) Dental Advisory Given   Pulmonary sleep apnea (does not use CPAP) , Recent URI , Residual Cough,    breath sounds clear to auscultation       Cardiovascular hypertension, Pt. on medications and Pt. on home beta blockers (-) angina+ dysrhythmias Atrial Fibrillation + Cardiac Defibrillator (has never shocked patient)  Rhythm:Regular Rate:Normal  '18 ECHO: EF 25-30% mod MR   Neuro/Psych    GI/Hepatic negative GI ROS, Neg liver ROS,   Endo/Other  Morbid obesity  Renal/GU Dialysis and ESRFRenal disease (creat 3.81)     Musculoskeletal  (+) Arthritis , Osteoarthritis,    Abdominal (+) + obese,   Peds  Hematology eliquis   Anesthesia Other Findings   Reproductive/Obstetrics                            Anesthesia Physical Anesthesia Plan  ASA: III  Anesthesia Plan: General   Post-op Pain Management:    Induction: Intravenous  PONV Risk Score and Plan: 2 and Ondansetron and Dexamethasone  Airway Management Planned: Oral ETT  Additional Equipment:   Intra-op Plan:   Post-operative Plan: Extubation in OR  Informed Consent: I have reviewed the patients History and Physical, chart, labs and discussed the procedure including the risks, benefits and alternatives for the proposed anesthesia with the patient or authorized representative who has indicated his/her understanding and acceptance.   Dental advisory given  Plan Discussed with: CRNA and Surgeon  Anesthesia Plan Comments: (Plan routine  monitors, GETA)        Anesthesia Quick Evaluation

## 2017-11-11 NOTE — Progress Notes (Signed)
Pt's BP medicines resumes after HD which was held earlier before HD Pt stable after HD, resting comfortably in chair  [place], RN

## 2017-11-11 NOTE — Progress Notes (Signed)
Progress Note  Patient Name: Eric Murillo Date of Encounter: 11/11/2017  Primary Cardiologist: [name], MD   Subjective   SOB improving  Inpatient Medications    Scheduled Meds: . allopurinol  100 mg Oral BID  . amiodarone  200 mg Oral Daily  . carvedilol  6.25 mg Oral BID WC  . isosorbide-hydrALAZINE  1 tablet Oral TID  . potassium chloride SA  40 mEq Oral TID  . sodium chloride flush  3 mL Intravenous Q12H   Continuous Infusions: . sodium chloride    . sodium chloride    . sodium chloride    . [START ON 11/12/2017]  ceFAZolin (ANCEF) IV     PRN Meds: sodium chloride, sodium chloride, sodium chloride, acetaminophen, acetaminophen, alteplase, heparin, HYDROcodone-homatropine, lidocaine (PF), lidocaine (PF), lidocaine-prilocaine, ondansetron (ZOFRAN) IV, pentafluoroprop-tetrafluoroeth, sodium chloride flush   Vital Signs    Vitals:   11/10/17 2150 11/11/17 0129 11/11/17 0646 11/11/17 0800  BP: 100/75  123/60 130/70  Pulse: 78  67   Resp: 18     Temp: 98.1 F (36.7 C)  98.5 F (36.9 C)   TempSrc: Oral  Oral   SpO2: 100%  98%   Weight:  (!) 309 lb 1.6 oz (140.2 kg)    Height:        Intake/Output Summary (Last 24 hours) at 11/11/2017 0927 Last data filed at 11/11/2017 0827 Gross per 24 hour  Intake 360 ml  Output 2400 ml  Net -2040 ml   Filed Weights   11/10/17 1812 11/10/17 2115 11/11/17 0129  Weight: (!) 326 lb 4.5 oz (148 kg) (!) 321 lb 14 oz (146 kg) (!) 309 lb 1.6 oz (140.2 kg)    Telemetry  SR  ECG    Physical Exam   GEN: No acute distress.   Neck: elevated JVD Cardiac: RRR, no murmurs, rubs, or gallops.  Respiratory: Clear to auscultation bilaterally. GI: Soft, nontender, non-distended  MS: 1-2+ bilateral LE edema; No deformity. Neuro:  Nonfocal  Psych: Normal affect   Labs    Chemistry Recent Labs  Lab 11/08/17 1402 11/09/17 0034 11/10/17 1930 11/11/17 0753  NA 139 139 137 140  K 3.4* 3.5 3.7 4.9  CL 93* 92* 95* 100*    CO2 30 28 28 28   GLUCOSE 128* 161* 126* 100*  BUN 105* 111* 69* 60*  CREATININE 4.87* 4.76* 3.50* 3.81*  CALCIUM 8.9 9.0 8.5* 8.8*  ALBUMIN 3.6  --  3.4* 3.7  GFRNONAA 12* 12* 18* 16*  GFRAA 14* 14* 21* 19*  ANIONGAP 16* 19* 14 12     Hematology Recent Labs  Lab 11/09/17 0034 11/10/17 0558 11/11/17 0753  WBC 11.1* 13.6* 12.9*  RBC 4.13* 3.99* 4.13*  HGB 10.7* 10.4* 10.6*  HCT 36.6* 35.5* 36.7*  MCV 88.6 89.0 88.9  MCH 25.9* 26.1 25.7*  MCHC 29.2* 29.3* 28.9*  RDW 16.1* 16.1* 16.0*  PLT 253 241 213    Cardiac EnzymesNo results for input(s): TROPONINI in the last 168 hours. No results for input(s): TROPIPOC in the last 168 hours.   BNPNo results for input(s): BNP, PROBNP in the last 168 hours.   DDimer No results for input(s): DDIMER in the last 168 hours.   Radiology    No results found.  Cardiac Studies     Patient Profile    Assessment & Plan    1. Acute on chronic systolic HF - Nonischemic cardiomyopathy x years. Last echo in 11/18 with moderate LV dilation, EF 20-25%, mildly  decreased RV systolic function. He had a workup for cardiac amyloidosis in the past. However, negative SPEP and abdominal fat pad biopsy negative. 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 Temp HD access placed with plans for permanent access once off eliquis longer, started on HD. Diuretics stopped  - once committed and stable on long term HD can consider ACE/ARB/ARNI/aldactone. Continue coreg and bidil - continued fluid removal with HD per nephrology today - continue current meds  2. Aflutter - Recurrent. Most recent DCCV in 11/18. Saw Dr Elberta Fortis, not thought to be a good ablation candidate. -Maintaining NSR - Continue amiodarone and apixaban.  - no changes today    3. AKI on CKD IV - renal following,  - Temp HD access placed with plans for  permanent access once off eliquis longer, started on HD.     For questions or updates, please contact [facility] Please consult [email or web address] for contact info under Cardiology/STEMI.      Joanie Coddington, MD  11/11/2017, 9:27 AM

## 2017-11-11 NOTE — Progress Notes (Signed)
[facility] Progress Note    Assessment/ Plan:    1  AKI on CKD stage IV--> ESRD - baseline creat 3.5- 4.2 as recently as Feb- Mar this year.  Not as big a response to lasix as hoped.  I think that the volume overload may be the event that tips him over the edge to ESRD.  Had several long talks with pt and he has been quite reluctant but ultimately is proceeding. S/p vein mapping and VVS c/s.  Scheduled for perm access after has been off Eliquis for a couple of days, planning for 4/22.  Appreciate IR with nontunneled HD cath 4/19 out of which he's dialyzing now.  HD #3 today 4/21 and then will plan for next HD 4/23.  2  Vol overload - acute on chronic, some of leg edema is prob d/t venous insufficiency.  +CHF on xray.  I've stopped the Lasix gtt and metolazone now that started dialysis.  3  HTN - on bidil, coreg.    4  Obesity- He would probably benefit from BiPaP/ CPAP at night- he reports he's intolerant but ? If some of his daytime sleepiness isn't just uremia but also hypercarbia  5  NICM/ sp ICD - EF 30-35%  6  AFib/ flutter - on amio/ BB/ eliquis on hold for access  7.  Anemia: hgb 10.5, haven't started ESA, will do iron panel  8.  Bone/ mineral: checking PTH  9.  Dispo: CLIP in process  Subjective:    HD #3 today.  Continues to improve.     Objective:   BP 103/90   Pulse 68   Temp 98.3 F (36.8 C) (Oral)   Resp (!) 26   Ht 5' 9.5" (1.765 m)   Wt (!) 142 kg (313 lb 0.9 oz)   SpO2 100%   BMI 45.57 kg/m   Intake/Output Summary (Last 24 hours) at 11/11/2017 1141 Last data filed at 11/11/2017 0827 Gross per 24 hour  Intake 360 ml  Output 2400 ml  Net -2040 ml   Weight change: 7.748 kg (17 lb 1.3 oz)  Physical Exam: Gen: sitting on edge of the bed, on phone HEENT:+ JVD to angle of mandible, unchanged CVS: RRR soft systolic murmur Resp: diminished bilateral bases, slightly improved Abd: obese Ext: 3+ LE edema, improving, Unna boots  Imaging: No  results found.  Labs: BMET Recent Labs  Lab 11/06/17 0713 11/07/17 0502 11/08/17 0407 11/08/17 1402 11/09/17 0034 11/10/17 1930 11/11/17 0753  NA 138 136 140 139 139 137 140  K 4.9 4.5 3.7 3.4* 3.5 3.7 4.9  CL 98* 98* 97* 93* 92* 95* 100*  CO2 27 26 29 30 28 28 28   GLUCOSE 124* 117* 142* 128* 161* 126* 100*  BUN 87* 98* 106* 105* 111* 69* 60*  CREATININE 5.13* 5.31* 5.14* 4.87* 4.76* 3.50* 3.81*  CALCIUM 9.1 9.0 8.9 8.9 9.0 8.5* 8.8*  PHOS  --   --   --  6.2*  --  3.3 3.3   CBC Recent Labs  Lab 11/07/17 0502 11/08/17 0407 11/08/17 1402 11/09/17 0034 11/10/17 0558 11/11/17 0753  WBC 11.7* 10.7* 11.0* 11.1* 13.6* 12.9*  NEUTROABS 8.4* 7.9*  --  8.5* 10.1*  --   HGB 10.8* 10.5* 10.5* 10.7* 10.4* 10.6*  HCT 35.7* 35.3* 34.9* 36.6* 35.5* 36.7*  MCV 87.5 88.9 88.4 88.6 89.0 88.9  PLT 224 247 244 253 241 213    Medications:    . allopurinol  100 mg Oral  BID  . amiodarone  200 mg Oral Daily  . carvedilol  6.25 mg Oral BID WC  . isosorbide-hydrALAZINE  1 tablet Oral TID  . potassium chloride SA  40 mEq Oral TID  . sodium chloride flush  3 mL Intravenous Q12H      Bufford Buttner, MD [facility] pgr [phone] 11/11/2017, 11:41 AM

## 2017-11-11 NOTE — Progress Notes (Signed)
Pt's MRSA by PCR is negative by the date of 11/08/2017  Lonia Farber, RN

## 2017-11-11 NOTE — Progress Notes (Signed)
Pt is aware of fistula placement scheduled for Monday, aware of to be in NPO from midnight, report provided to the second RN at 11pm for continuation of care  [place], [place]

## 2017-11-12 ENCOUNTER — Encounter (HOSPITAL_COMMUNITY)
Admission: EM | Disposition: A | Discharge status: Home / Self Care | Payer: Self-pay | Source: Home / Self Care | Attending: Cardiology

## 2017-11-12 ENCOUNTER — Inpatient Hospital Stay (HOSPITAL_COMMUNITY): Payer: Medicaid Other

## 2017-11-12 ENCOUNTER — Inpatient Hospital Stay (HOSPITAL_COMMUNITY): Payer: Medicaid Other | Admitting: Anesthesiology

## 2017-11-12 ENCOUNTER — Encounter (HOSPITAL_COMMUNITY): Payer: Self-pay | Admitting: Certified Registered"

## 2017-11-12 ENCOUNTER — Inpatient Hospital Stay (HOSPITAL_COMMUNITY): Payer: Medicaid Other | Admitting: Certified Registered"

## 2017-11-12 HISTORY — PX: AV FISTULA PLACEMENT: SHX1204

## 2017-11-12 HISTORY — PX: INSERTION OF DIALYSIS CATHETER: SHX1324

## 2017-11-12 LAB — COMPREHENSIVE METABOLIC PANEL
ALBUMIN: 3.3 g/dL — AB (ref 3.5–5.0)
ALT: 50 U/L (ref 17–63)
AST: 77 U/L — AB (ref 15–41)
Alkaline Phosphatase: 182 U/L — ABNORMAL HIGH (ref 38–126)
Anion gap: 16 — ABNORMAL HIGH (ref 5–15)
BILIRUBIN TOTAL: 2.1 mg/dL — AB (ref 0.3–1.2)
BUN: 57 mg/dL — AB (ref 6–20)
CHLORIDE: 98 mmol/L — AB (ref 101–111)
CO2: 22 mmol/L (ref 22–32)
Calcium: 9.3 mg/dL (ref 8.9–10.3)
Creatinine, Ser: 5.66 mg/dL — ABNORMAL HIGH (ref 0.61–1.24)
GFR calc Af Amer: 12 mL/min — ABNORMAL LOW (ref >60)
GFR calc non Af Amer: 10 mL/min — ABNORMAL LOW (ref >60)
GLUCOSE: 213 mg/dL — AB (ref 65–99)
POTASSIUM: 6.1 mmol/L — AB (ref 3.5–5.1)
Sodium: 136 mmol/L (ref 135–145)
Total Protein: 7 g/dL (ref 6.5–8.1)

## 2017-11-12 LAB — PROTIME-INR
INR: 1.53
INR: 1.7
PROTHROMBIN TIME: 18.2 s — AB (ref 11.4–15.2)
Prothrombin Time: 19.9 seconds — ABNORMAL HIGH (ref 11.4–15.2)

## 2017-11-12 LAB — POCT I-STAT 3, ART BLOOD GAS (G3+)
Acid-base deficit: 6 mmol/L — ABNORMAL HIGH (ref 0.0–2.0)
Acid-base deficit: 2 mmol/L (ref 0.0–2.0)
BICARBONATE: 24.3 mmol/L (ref 20.0–28.0)
Bicarbonate: 24.9 mmol/L (ref 20.0–28.0)
O2 SAT: 99 %
O2 Saturation: 95 %
PCO2 ART: 50.6 mmHg — AB (ref 32.0–48.0)
PO2 ART: 100 mmHg (ref 83.0–108.0)
PO2 ART: 153 mmHg — AB (ref 83.0–108.0)
Patient temperature: 98
Patient temperature: 98.5
TCO2: 26 mmol/L (ref 22–32)
TCO2: 26 mmol/L (ref 22–32)
pCO2 arterial: 71.3 mmHg (ref 32.0–48.0)
pH, Arterial: 7.138 — CL (ref 7.350–7.450)
pH, Arterial: 7.3 — ABNORMAL LOW (ref 7.350–7.450)

## 2017-11-12 LAB — BASIC METABOLIC PANEL
Anion gap: 11 (ref 5–15)
BUN: 48 mg/dL — AB (ref 6–20)
CHLORIDE: 101 mmol/L (ref 101–111)
CO2: 25 mmol/L (ref 22–32)
Calcium: 9 mg/dL (ref 8.9–10.3)
Creatinine, Ser: 4.31 mg/dL — ABNORMAL HIGH (ref 0.61–1.24)
GFR calc Af Amer: 16 mL/min — ABNORMAL LOW (ref >60)
GFR, EST NON AFRICAN AMERICAN: 14 mL/min — AB (ref >60)
GLUCOSE: 105 mg/dL — AB (ref 65–99)
Potassium: 6.2 mmol/L — ABNORMAL HIGH (ref 3.5–5.1)
SODIUM: 137 mmol/L (ref 135–145)

## 2017-11-12 LAB — CBC
HCT: 39.7 % (ref 39.0–52.0)
HCT: 40.2 % (ref 39.0–52.0)
HEMOGLOBIN: 11.7 g/dL — AB (ref 13.0–17.0)
Hemoglobin: 11.6 g/dL — ABNORMAL LOW (ref 13.0–17.0)
MCH: 26.3 pg (ref 26.0–34.0)
MCH: 26.9 pg (ref 26.0–34.0)
MCHC: 29.5 g/dL — AB (ref 30.0–36.0)
MCHC: 28.9 g/dL — ABNORMAL LOW (ref 30.0–36.0)
MCV: 89.2 fL (ref 78.0–100.0)
MCV: 93.3 fL (ref 78.0–100.0)
Platelets: 176 10*3/uL (ref 150–400)
Platelets: 156 10*3/uL (ref 150–400)
RBC: 4.45 MIL/uL (ref 4.22–5.81)
RBC: 4.31 MIL/uL (ref 4.22–5.81)
RDW: 16.4 % — ABNORMAL HIGH (ref 11.5–15.5)
RDW: 16.3 % — ABNORMAL HIGH (ref 11.5–15.5)
WBC: 11.5 10*3/uL — ABNORMAL HIGH (ref 4.0–10.5)
WBC: 11.9 10*3/uL — AB (ref 4.0–10.5)

## 2017-11-12 LAB — MAGNESIUM: Magnesium: 2.6 mg/dL — ABNORMAL HIGH (ref 1.7–2.4)

## 2017-11-12 LAB — IRON AND TIBC
IRON: 78 ug/dL (ref 45–182)
Saturation Ratios: 17 % — ABNORMAL LOW (ref 17.9–39.5)
TIBC: 468 ug/dL — ABNORMAL HIGH (ref 250–450)
UIBC: 390 ug/dL

## 2017-11-12 LAB — GLUCOSE, CAPILLARY: Glucose-Capillary: 174 mg/dL — ABNORMAL HIGH (ref 65–99)

## 2017-11-12 LAB — LACTIC ACID, PLASMA: Lactic Acid, Venous: 5.7 mmol/L (ref 0.5–1.9)

## 2017-11-12 LAB — PROCALCITONIN: PROCALCITONIN: 1.3 ng/mL

## 2017-11-12 LAB — FERRITIN: Ferritin: 36 ng/mL (ref 24–336)

## 2017-11-12 SURGERY — INSERTION OF DIALYSIS CATHETER
Anesthesia: General | Site: Neck | Laterality: Right

## 2017-11-12 MED ORDER — ONDANSETRON HCL 4 MG/2ML IJ SOLN
INTRAMUSCULAR | Status: AC
  Filled 2017-11-12: qty 2

## 2017-11-12 MED ORDER — MIDAZOLAM HCL 2 MG/2ML IJ SOLN: 2.0000 mg | INTRAMUSCULAR | Status: DC | PRN

## 2017-11-12 MED ORDER — HEPARIN SODIUM (PORCINE) 1000 UNIT/ML DIALYSIS
1000.0000 [IU] | INTRAMUSCULAR | Status: DC | PRN
Start: 2017-11-12 — End: 2017-11-13
  Filled 2017-11-12: qty 6

## 2017-11-12 MED ORDER — HEPARIN SODIUM (PORCINE) 1000 UNIT/ML IJ SOLN
INTRAMUSCULAR | Status: AC
  Filled 2017-11-12: qty 1

## 2017-11-12 MED ORDER — PROPOFOL 10 MG/ML IV BOLUS
INTRAVENOUS | Status: AC
  Filled 2017-11-12: qty 40

## 2017-11-12 MED ORDER — LIDOCAINE 2% (20 MG/ML) 5 ML SYRINGE
INTRAMUSCULAR | Status: DC | PRN
  Administered 2017-11-12: 40 mg via INTRAVENOUS

## 2017-11-12 MED ORDER — OXYMETAZOLINE HCL 0.05 % NA SOLN
NASAL | Status: DC | PRN
  Administered 2017-11-12: 1

## 2017-11-12 MED ORDER — PRISMASOL BGK 0/2.5 32-2.5 MEQ/L IV SOLN
INTRAVENOUS | Status: DC
  Administered 2017-11-12 – 2017-11-13 (×4): via INTRAVENOUS_CENTRAL
  Filled 2017-11-12 (×5): qty 5000

## 2017-11-12 MED ORDER — SODIUM CHLORIDE 0.9 % IV SOLN
1.0000 g | Freq: Once | INTRAVENOUS | Status: DC
  Filled 2017-11-12: qty 10

## 2017-11-12 MED ORDER — SODIUM CHLORIDE 0.9 % FOR CRRT
INTRAVENOUS_CENTRAL | Status: DC | PRN
  Filled 2017-11-12: qty 1000

## 2017-11-12 MED ORDER — MEPERIDINE HCL 50 MG/ML IJ SOLN: 6.2500 mg | INTRAMUSCULAR | Status: DC | PRN

## 2017-11-12 MED ORDER — MIDAZOLAM HCL 2 MG/2ML IJ SOLN
2.0000 mg | Freq: Once | INTRAMUSCULAR | Status: AC
  Administered 2017-11-12: 2 mg via INTRAVENOUS

## 2017-11-12 MED ORDER — ETOMIDATE 2 MG/ML IV SOLN
INTRAVENOUS | Status: DC | PRN
  Administered 2017-11-12: 20 mg via INTRAVENOUS

## 2017-11-12 MED ORDER — HEPARIN SODIUM (PORCINE) 1000 UNIT/ML IJ SOLN
INTRAMUSCULAR | Status: DC | PRN
  Administered 2017-11-12: 5000 [IU] via INTRAVENOUS

## 2017-11-12 MED ORDER — OXYMETAZOLINE HCL 0.05 % NA SOLN
NASAL | Status: DC | PRN
  Administered 2017-11-12: 2 via NASAL

## 2017-11-12 MED ORDER — SODIUM POLYSTYRENE SULFONATE 15 GM/60ML PO SUSP
30.0000 g | Freq: Once | ORAL | Status: DC
  Filled 2017-11-12: qty 120

## 2017-11-12 MED ORDER — LIDOCAINE 2% (20 MG/ML) 5 ML SYRINGE
INTRAMUSCULAR | Status: AC
  Filled 2017-11-12: qty 5

## 2017-11-12 MED ORDER — CISATRACURIUM BESYLATE 20 MG/10ML IV SOLN
INTRAVENOUS | Status: AC
  Filled 2017-11-12: qty 10

## 2017-11-12 MED ORDER — FENTANYL CITRATE (PF) 250 MCG/5ML IJ SOLN
INTRAMUSCULAR | Status: AC
  Filled 2017-11-12: qty 5

## 2017-11-12 MED ORDER — FENTANYL BOLUS VIA INFUSION
25.0000 ug | INTRAVENOUS | Status: DC | PRN
  Filled 2017-11-12: qty 25

## 2017-11-12 MED ORDER — EPHEDRINE SULFATE-NACL 50-0.9 MG/10ML-% IV SOSY
PREFILLED_SYRINGE | INTRAVENOUS | Status: DC | PRN
  Administered 2017-11-12: 10 mg via INTRAVENOUS

## 2017-11-12 MED ORDER — BISACODYL 10 MG RE SUPP: 10.0000 mg | Freq: Every day | RECTAL | Status: DC | PRN

## 2017-11-12 MED ORDER — PHENYLEPHRINE HCL 10 MG/ML IJ SOLN
INTRAVENOUS | Status: DC | PRN
  Administered 2017-11-12: 80 ug/min via INTRAVENOUS

## 2017-11-12 MED ORDER — APIXABAN 5 MG PO TABS: 5.0000 mg | ORAL_TABLET | Freq: Two times a day (BID) | ORAL | Status: DC

## 2017-11-12 MED ORDER — EPHEDRINE 5 MG/ML INJ
INTRAVENOUS | Status: AC
  Filled 2017-11-12: qty 10

## 2017-11-12 MED ORDER — MIDAZOLAM HCL 5 MG/5ML IJ SOLN
INTRAMUSCULAR | Status: DC | PRN
  Administered 2017-11-12: 1 mg via INTRAVENOUS

## 2017-11-12 MED ORDER — SODIUM CHLORIDE 0.9 % IV SOLN
INTRAVENOUS | Status: AC
  Filled 2017-11-12: qty 1.2

## 2017-11-12 MED ORDER — DOCUSATE SODIUM 50 MG/5ML PO LIQD
100.0000 mg | Freq: Two times a day (BID) | ORAL | Status: DC | PRN
  Filled 2017-11-12: qty 10

## 2017-11-12 MED ORDER — ACETAMINOPHEN 160 MG/5ML PO SOLN
650.0000 mg | ORAL | Status: DC | PRN
  Filled 2017-11-12: qty 20.3

## 2017-11-12 MED ORDER — FENTANYL CITRATE (PF) 100 MCG/2ML IJ SOLN
INTRAMUSCULAR | Status: AC
  Filled 2017-11-12: qty 2

## 2017-11-12 MED ORDER — SODIUM CHLORIDE 0.9 % IJ SOLN
250.0000 [IU]/h | INTRAMUSCULAR | Status: DC
  Administered 2017-11-12: 250 [IU]/h via INTRAVENOUS_CENTRAL
  Administered 2017-11-13: 1150 [IU]/h via INTRAVENOUS_CENTRAL
  Administered 2017-11-13: 1550 [IU]/h via INTRAVENOUS_CENTRAL
  Filled 2017-11-12 (×2): qty 2

## 2017-11-12 MED ORDER — NALOXONE HCL 0.4 MG/ML IJ SOLN
INTRAMUSCULAR | Status: AC
  Administered 2017-11-12: 0.4 mg
  Filled 2017-11-12: qty 1

## 2017-11-12 MED ORDER — GLYCOPYRROLATE 0.2 MG/ML IJ SOLN
INTRAMUSCULAR | Status: DC | PRN
  Administered 2017-11-12: 0.4 mg via INTRAVENOUS

## 2017-11-12 MED ORDER — ALBUTEROL SULFATE (2.5 MG/3ML) 0.083% IN NEBU
2.5000 mg | INHALATION_SOLUTION | Freq: Once | RESPIRATORY_TRACT | Status: AC
  Administered 2017-11-12: 2.5 mg via RESPIRATORY_TRACT

## 2017-11-12 MED ORDER — SODIUM CHLORIDE 0.9 % IV SOLN
INTRAVENOUS | Status: DC | PRN
  Administered 2017-11-12: 08:00:00 via INTRAVENOUS

## 2017-11-12 MED ORDER — DEXAMETHASONE SODIUM PHOSPHATE 10 MG/ML IJ SOLN
INTRAMUSCULAR | Status: AC
  Filled 2017-11-12: qty 1

## 2017-11-12 MED ORDER — PRISMASOL BGK 0/2.5 32-2.5 MEQ/L IV SOLN
INTRAVENOUS | Status: DC
  Administered 2017-11-12: 22:00:00 via INTRAVENOUS_CENTRAL
  Filled 2017-11-12 (×2): qty 5000

## 2017-11-12 MED ORDER — LIDOCAINE HCL (PF) 1 % IJ SOLN
INTRAMUSCULAR | Status: AC
  Filled 2017-11-12: qty 30

## 2017-11-12 MED ORDER — CEFAZOLIN SODIUM 1 G IJ SOLR
INTRAMUSCULAR | Status: AC
  Filled 2017-11-12: qty 10

## 2017-11-12 MED ORDER — MIDAZOLAM HCL 2 MG/2ML IJ SOLN
INTRAMUSCULAR | Status: AC
  Filled 2017-11-12: qty 2

## 2017-11-12 MED ORDER — PHENYLEPHRINE 40 MCG/ML (10ML) SYRINGE FOR IV PUSH (FOR BLOOD PRESSURE SUPPORT)
PREFILLED_SYRINGE | INTRAVENOUS | Status: AC
  Filled 2017-11-12: qty 10

## 2017-11-12 MED ORDER — PROPOFOL 1000 MG/100ML IV EMUL
5.0000 ug/kg/min | INTRAVENOUS | Status: DC
  Administered 2017-11-12: 5 ug/kg/min via INTRAVENOUS
  Filled 2017-11-12 (×2): qty 100

## 2017-11-12 MED ORDER — HEPARIN SODIUM (PORCINE) 1000 UNIT/ML IJ SOLN
INTRAMUSCULAR | Status: DC | PRN
  Administered 2017-11-12: 3.4 [IU] via INTRAVENOUS

## 2017-11-12 MED ORDER — ONDANSETRON HCL 4 MG/2ML IJ SOLN
INTRAMUSCULAR | Status: DC | PRN
  Administered 2017-11-12: 4 mg via INTRAVENOUS

## 2017-11-12 MED ORDER — FENTANYL CITRATE (PF) 100 MCG/2ML IJ SOLN
INTRAMUSCULAR | Status: AC
  Administered 2017-11-12: 100 ug
  Filled 2017-11-12: qty 2

## 2017-11-12 MED ORDER — OXYMETAZOLINE HCL 0.05 % NA SOLN
NASAL | Status: AC
  Filled 2017-11-12: qty 15

## 2017-11-12 MED ORDER — MIDAZOLAM HCL 2 MG/2ML IJ SOLN: 0.5000 mg | Freq: Once | INTRAMUSCULAR | Status: DC | PRN

## 2017-11-12 MED ORDER — PROMETHAZINE HCL 25 MG/ML IJ SOLN: 6.2500 mg | INTRAMUSCULAR | Status: DC | PRN

## 2017-11-12 MED ORDER — CISATRACURIUM BESYLATE (PF) 10 MG/5ML IV SOLN
INTRAVENOUS | Status: DC | PRN
  Administered 2017-11-12: 2 mg via INTRAVENOUS
  Administered 2017-11-12: 16 mg via INTRAVENOUS
  Administered 2017-11-12: 2 mg via INTRAVENOUS

## 2017-11-12 MED ORDER — ORAL CARE MOUTH RINSE
15.0000 mL | Freq: Four times a day (QID) | OROMUCOSAL | Status: DC
  Administered 2017-11-13 (×2): 15 mL via OROMUCOSAL

## 2017-11-12 MED ORDER — PROTAMINE SULFATE 10 MG/ML IV SOLN
INTRAVENOUS | Status: AC
  Filled 2017-11-12: qty 5

## 2017-11-12 MED ORDER — 0.9 % SODIUM CHLORIDE (POUR BTL) OPTIME
TOPICAL | Status: DC | PRN
  Administered 2017-11-12: 1000 mL

## 2017-11-12 MED ORDER — HEPARIN BOLUS VIA INFUSION (CRRT)
1000.0000 [IU] | INTRAVENOUS | Status: DC | PRN
  Administered 2017-11-12 – 2017-11-13 (×3): 1000 [IU] via INTRAVENOUS_CENTRAL
  Filled 2017-11-12 (×2): qty 1000

## 2017-11-12 MED ORDER — NEOSTIGMINE METHYLSULFATE 5 MG/5ML IV SOSY
PREFILLED_SYRINGE | INTRAVENOUS | Status: AC
  Filled 2017-11-12: qty 5

## 2017-11-12 MED ORDER — SODIUM CHLORIDE 0.9 % IV SOLN
INTRAVENOUS | Status: DC | PRN
  Administered 2017-11-12: 500 mL

## 2017-11-12 MED ORDER — INSULIN ASPART 100 UNIT/ML ~~LOC~~ SOLN
1.0000 [IU] | SUBCUTANEOUS | Status: DC
  Administered 2017-11-12: 2 [IU] via SUBCUTANEOUS
  Administered 2017-11-13 – 2017-11-20 (×2): 1 [IU] via SUBCUTANEOUS

## 2017-11-12 MED ORDER — FENTANYL CITRATE (PF) 100 MCG/2ML IJ SOLN
50.0000 ug | Freq: Once | INTRAMUSCULAR | Status: AC
  Administered 2017-11-12: 50 ug via INTRAVENOUS

## 2017-11-12 MED ORDER — DEXTROSE 5 % IV SOLN
0.0000 ug/min | INTRAVENOUS | Status: DC
  Administered 2017-11-12: 15 ug/min via INTRAVENOUS
  Administered 2017-11-14: 10 ug/min via INTRAVENOUS
  Filled 2017-11-12 (×2): qty 16

## 2017-11-12 MED ORDER — FENTANYL CITRATE (PF) 100 MCG/2ML IJ SOLN
INTRAMUSCULAR | Status: DC | PRN
  Administered 2017-11-12: 50 ug via INTRAVENOUS

## 2017-11-12 MED ORDER — FENTANYL 2500MCG IN NS 250ML (10MCG/ML) PREMIX INFUSION
25.0000 ug/h | INTRAVENOUS | Status: DC
  Administered 2017-11-12: 50 ug/h via INTRAVENOUS
  Administered 2017-11-13: 100 ug/h via INTRAVENOUS
  Filled 2017-11-12 (×2): qty 250

## 2017-11-12 MED ORDER — DEXAMETHASONE SODIUM PHOSPHATE 10 MG/ML IJ SOLN
INTRAMUSCULAR | Status: DC | PRN
  Administered 2017-11-12: 10 mg via INTRAVENOUS

## 2017-11-12 MED ORDER — ISOSORBIDE MONONITRATE ER 30 MG PO TB24: 30.0000 mg | ORAL_TABLET | Freq: Every day | ORAL | Status: DC

## 2017-11-12 MED ORDER — FENTANYL CITRATE (PF) 100 MCG/2ML IJ SOLN: 25.0000 ug | INTRAMUSCULAR | Status: DC | PRN

## 2017-11-12 MED ORDER — CHLORHEXIDINE GLUCONATE 0.12% ORAL RINSE (MEDLINE KIT)
15.0000 mL | Freq: Two times a day (BID) | OROMUCOSAL | Status: DC
  Administered 2017-11-12 – 2017-11-14 (×5): 15 mL via OROMUCOSAL

## 2017-11-12 MED ORDER — ALBUTEROL SULFATE (2.5 MG/3ML) 0.083% IN NEBU
INHALATION_SOLUTION | RESPIRATORY_TRACT | Status: AC
  Filled 2017-11-12: qty 3

## 2017-11-12 MED ORDER — ALLOPURINOL 100 MG PO TABS
100.0000 mg | ORAL_TABLET | Freq: Two times a day (BID) | ORAL | Status: DC
  Administered 2017-11-12 – 2017-11-16 (×8): 100 mg
  Filled 2017-11-12 (×8): qty 1

## 2017-11-12 MED ORDER — SODIUM CHLORIDE 0.9 % IV SOLN
510.0000 mg | Freq: Once | INTRAVENOUS | Status: DC
  Filled 2017-11-12: qty 17

## 2017-11-12 MED ORDER — MIDAZOLAM HCL 2 MG/2ML IJ SOLN
INTRAMUSCULAR | Status: AC
  Administered 2017-11-12: 2 mg via INTRAVENOUS
  Filled 2017-11-12: qty 2

## 2017-11-12 MED ORDER — NEOSTIGMINE METHYLSULFATE 10 MG/10ML IV SOLN
INTRAVENOUS | Status: DC | PRN
  Administered 2017-11-12: 3 mg via INTRAVENOUS

## 2017-11-12 MED ORDER — AMIODARONE HCL 200 MG PO TABS
200.0000 mg | ORAL_TABLET | Freq: Every day | ORAL | Status: DC
  Administered 2017-11-14 – 2017-11-16 (×3): 200 mg
  Filled 2017-11-12 (×4): qty 1

## 2017-11-12 MED ORDER — PHENYLEPHRINE 40 MCG/ML (10ML) SYRINGE FOR IV PUSH (FOR BLOOD PRESSURE SUPPORT)
PREFILLED_SYRINGE | INTRAVENOUS | Status: DC | PRN
  Administered 2017-11-12: 80 ug via INTRAVENOUS
  Administered 2017-11-12: 120 ug via INTRAVENOUS

## 2017-11-12 MED ORDER — OXYCODONE-ACETAMINOPHEN 5-325 MG PO TABS: 1.0000 | ORAL_TABLET | ORAL | Status: DC | PRN

## 2017-11-12 SURGICAL SUPPLY — 67 items
ADH SKN CLS LQ APL DERMABOND (GAUZE/BANDAGES/DRESSINGS) ×4
BAG DECANTER FOR FLEXI CONT (MISCELLANEOUS) ×3 IMPLANT
BIOPATCH RED 1 DISK 7.0 (GAUZE/BANDAGES/DRESSINGS) ×3 IMPLANT
BLADE SURG 11 STRL SS (BLADE) ×1 IMPLANT
BOOT SUTURE AID YELLOW STND (SUTURE) ×1 IMPLANT
CANNULA VESSEL 3MM 2 BLNT TIP (CANNULA) ×1 IMPLANT
CATH PALINDROME RT-P 15FX19CM (CATHETERS) IMPLANT
CATH PALINDROME RT-P 15FX23CM (CATHETERS) ×1 IMPLANT
CATH PALINDROME RT-P 15FX28CM (CATHETERS) IMPLANT
CATH PALINDROME RT-P 15FX55CM (CATHETERS) IMPLANT
CATH STRAIGHT 5FR 65CM (CATHETERS) IMPLANT
CHLORAPREP W/TINT 26ML (MISCELLANEOUS) ×3 IMPLANT
CLIP TI WIDE RED SMALL 6 (CLIP) ×1 IMPLANT
COVER PROBE W GEL 5X96 (DRAPES) ×1 IMPLANT
COVER SURGICAL LIGHT HANDLE (MISCELLANEOUS) ×4 IMPLANT
DECANTER SPIKE VIAL GLASS SM (MISCELLANEOUS) IMPLANT
DERMABOND ADHESIVE PROPEN (GAUZE/BANDAGES/DRESSINGS) ×2
DERMABOND ADVANCED .7 DNX6 (GAUZE/BANDAGES/DRESSINGS) IMPLANT
DRAIN PENROSE 1/2X12 LTX STRL (WOUND CARE) ×1 IMPLANT
DRAPE C-ARM 42X72 X-RAY (DRAPES) ×3 IMPLANT
DRAPE CHEST BREAST 15X10 FENES (DRAPES) ×3 IMPLANT
DRAPE HALF SHEET 40X57 (DRAPES) ×1 IMPLANT
DRAPE IMP U-DRAPE 54X76 (DRAPES) ×1 IMPLANT
DRAPE ORTHO SPLIT 77X108 STRL (DRAPES) ×3
DRAPE SURG ORHT 6 SPLT 77X108 (DRAPES) IMPLANT
GAUZE SPONGE 4X4 16PLY XRAY LF (GAUZE/BANDAGES/DRESSINGS) ×3 IMPLANT
GLOVE BIO SURGEON STRL SZ7.5 (GLOVE) ×3 IMPLANT
GLOVE BIOGEL PI IND STRL 7.5 (GLOVE) IMPLANT
GLOVE BIOGEL PI INDICATOR 7.5 (GLOVE) ×1
GOWN STRL REUS W/ TWL LRG LVL3 (GOWN DISPOSABLE) ×4 IMPLANT
GOWN STRL REUS W/TWL LRG LVL3 (GOWN DISPOSABLE) ×6
KIT BASIN OR (CUSTOM PROCEDURE TRAY) ×3 IMPLANT
KIT TURNOVER KIT B (KITS) ×3 IMPLANT
LOOP VESSEL MAXI BLUE (MISCELLANEOUS) ×1 IMPLANT
LOOP VESSEL MINI RED (MISCELLANEOUS) ×1 IMPLANT
NDL 18GX1X1/2 (RX/OR ONLY) (NEEDLE) ×2 IMPLANT
NDL HYPO 25GX1X1/2 BEV (NEEDLE) ×2 IMPLANT
NEEDLE 18GX1X1/2 (RX/OR ONLY) (NEEDLE) ×3 IMPLANT
NEEDLE HYPO 25GX1X1/2 BEV (NEEDLE) ×3 IMPLANT
NS IRRIG 1000ML POUR BTL (IV SOLUTION) ×3 IMPLANT
PACK GENERAL/GYN (CUSTOM PROCEDURE TRAY) ×1 IMPLANT
PACK SURGICAL SETUP 50X90 (CUSTOM PROCEDURE TRAY) ×2 IMPLANT
PAD ARMBOARD 7.5X6 YLW CONV (MISCELLANEOUS) ×6 IMPLANT
SET MICROPUNCTURE 5F STIFF (MISCELLANEOUS) IMPLANT
STOCKINETTE 6  STRL (DRAPES) ×1
STOCKINETTE 6 STRL (DRAPES) IMPLANT
SUT ETHILON 3 0 PS 1 (SUTURE) ×3 IMPLANT
SUT PROLENE 6 0 CC (SUTURE) ×1 IMPLANT
SUT PROLENE 7 0 BV 1 (SUTURE) ×1 IMPLANT
SUT SILK 2 0 (SUTURE) ×3
SUT SILK 2-0 18XBRD TIE 12 (SUTURE) IMPLANT
SUT SILK 3 0 (SUTURE) ×3
SUT SILK 3-0 18XBRD TIE 12 (SUTURE) IMPLANT
SUT SILK 4 0 (SUTURE) ×3
SUT SILK 4-0 18XBRD TIE 12 (SUTURE) IMPLANT
SUT VIC AB 3-0 SH 27 (SUTURE) ×3
SUT VIC AB 3-0 SH 27X BRD (SUTURE) IMPLANT
SUT VIC AB 4-0 PS2 27 (SUTURE) ×1 IMPLANT
SUT VICRYL 4-0 PS2 18IN ABS (SUTURE) ×3 IMPLANT
SYR 10ML LL (SYRINGE) ×3 IMPLANT
SYR 20CC LL (SYRINGE) ×6 IMPLANT
SYR 5ML LL (SYRINGE) ×3 IMPLANT
SYR CONTROL 10ML LL (SYRINGE) ×3 IMPLANT
TOWEL GREEN STERILE (TOWEL DISPOSABLE) ×6 IMPLANT
TOWEL GREEN STERILE FF (TOWEL DISPOSABLE) ×3 IMPLANT
WATER STERILE IRR 1000ML POUR (IV SOLUTION) ×3 IMPLANT
WIRE AMPLATZ SS-J .035X180CM (WIRE) IMPLANT

## 2017-11-12 NOTE — Anesthesia Postprocedure Evaluation (Signed)
Anesthesia Post Note  Patient: Moss Seurer  Procedure(s) Performed: PLACEMENT of right internal jugualr tunneled DIALYSIS CATHETER with removal or temporary dialysis catheter (Right Neck) CREATION of RIGHT BRACHIAL CEPHALIC ARTERIOVENOUS (AV) FISTULA (Right Arm Upper)     Patient location during evaluation: PACU Anesthesia Type: General Level of consciousness: oriented, patient cooperative and sedated Pain management: pain level controlled Vital Signs Assessment: post-procedure vital signs reviewed and stable Respiratory status: spontaneous breathing, nonlabored ventilation, respiratory function stable and patient connected to nasal cannula oxygen Cardiovascular status: blood pressure returned to baseline and stable Postop Assessment: no apparent nausea or vomiting Anesthetic complications: no    Last Vitals:  Vitals:   11/12/17 1355 11/12/17 1410  BP: 114/86 (!) 97/58  Pulse: 78 80  Resp: (!) 24 (!) 30  Temp: (!) 36.3 C   SpO2: 90% 93%    Last Pain:  Vitals:   11/12/17 1325  TempSrc:   PainSc: Asleep                 Darryl Willner,E. Tkai Serfass

## 2017-11-12 NOTE — Progress Notes (Addendum)
[facility] NEPHROLOGY PROGRESS NOTE  Assessment/ Plan: Pt is a 57 y.o. yo male with chronic kidney disease stage IV, admitted with  congestive heart failure exacerbation and worsening renal failure.  Placement of right IJ tunnel catheter and AV fistula on 11/12/17 by VVS.  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  Placement of right upper extremity AV fistula and right IJ tunneled catheter today.  Patient is having shortness of breath and worsening hypoxia.  Also has mild hyperkalemia.  Planning to do 3 hours of intermittent hemodialysis.  Patient agreed with the plan.  He will need fluid off.  Needs to arrange for outpatient hemodialysis.  #Hyperkalemia: Received a dose of Kayexalate.  Plan for dialysis today.  #Acute respiratory failure with hypoxia: HD today with ultrafiltration.  Recommended to use oxygen via nasal cannula.  #CHF exacerbation with volume overload: He has leg edema.  Off diuretics.  #Hypertension: On BiDil and Coreg.  Monitor blood pressure.  Blood pressure acceptable.  #Anemia of CKD: Hemoglobin 11.7.  Iron saturation 17% with a serum iron of 78.  Ordered a dose of Feraheme 510 mg IV.  #Secondary hyperparathyroidism: Follow-up PTH level.  Phosphorus 3.3.  Off binders  #Nonischemic cardiomyopathy status post ICD: EF of 25-30%.  #A. fib/flutter: On amiodarone, beta-blocker and Eliquis  Subjective: Seen and examined at bedside.  Came after the vascular procedure.  Some somnolent.  Denies chest pain.  Having shortness of breath. Objective Vital signs in last 24 hours: Vitals:   11/12/17 1355 11/12/17 1410 11/12/17 1425 11/12/17 1500  BP: 114/86 (!) 97/58 119/90 123/77  Pulse: 78 80 75 79  Resp: (!) 24 (!) 30 (!) 28 (!) 25  Temp: (!) 97.3 F (36.3 C)   97.7 F (36.5 C)  TempSrc:    Oral  SpO2: 90% 93% 94% 98%  Weight:      Height:       Weight change: -6 kg (-13 lb 3.6 oz)  Intake/Output  Summary (Last 24 hours) at 11/12/2017 1725 Last data filed at 11/12/2017 1056 Gross per 24 hour  Intake 720 ml  Output 10 ml  Net 710 ml       Labs: Basic Metabolic Panel: Recent Labs  Lab 11/08/17 1402  11/10/17 1930 11/11/17 0753 11/12/17 1104  NA 139   < > 137 140 137  K 3.4*   < > 3.7 4.9 6.2*  CL 93*   < > 95* 100* 101  CO2 30   < > 28 28 25   GLUCOSE 128*   < > 126* 100* 105*  BUN 105*   < > 69* 60* 48*  CREATININE 4.87*   < > 3.50* 3.81* 4.31*  CALCIUM 8.9   < > 8.5* 8.8* 9.0  PHOS 6.2*  --  3.3 3.3  --    < > = values in this interval not displayed.   Liver Function Tests: Recent Labs  Lab 11/08/17 1402 11/10/17 1930 11/11/17 0753  ALBUMIN 3.6 3.4* 3.7   No results for input(s): LIPASE, AMYLASE in the last 168 hours. No results for input(s): AMMONIA in the last 168 hours. CBC: Recent Labs  Lab 11/08/17 0407 11/08/17 1402 11/09/17 0034 11/10/17 0558 11/11/17 0753 11/12/17 1104  WBC 10.7* 11.0* 11.1* 13.6* 12.9* 11.5*  NEUTROABS 7.9*  --  8.5* 10.1*  --   --   HGB 10.5* 10.5* 10.7* 10.4* 10.6* 11.7*  HCT 35.3* 34.9* 36.6* 35.5* 36.7* 39.7  MCV 88.9 88.4 88.6 89.0 88.9 89.2  PLT 247 244 253 241 213 176   Cardiac Enzymes: No results for input(s): CKTOTAL, CKMB, CKMBINDEX, TROPONINI in the last 168 hours. CBG: No results for input(s): GLUCAP in the last 168 hours.  Iron Studies:  Recent Labs    11/12/17 1104  IRON 78  TIBC 468*  FERRITIN 36   Studies/Results: Dg Chest Port 1 View  Result Date: 11/12/2017 CLINICAL DATA:  Status post dialysis catheter insertion. EXAM: PORTABLE CHEST 1 VIEW COMPARISON:  Chest x-ray of November 03, 2017 FINDINGS: The dual-lumen dialysis catheter tip projects over the distal third of the SVC. There is no postprocedure pneumothorax or hemothorax. Both lungs are mildly hypoinflated. The cardiac silhouette is enlarged and the central pulmonary vascularity is engorged. The interstitial edema has improved. The ICD is in  stable position. There is calcification in the wall of the aortic arch. IMPRESSION: No postprocedure complication following dialysis catheter placement. Improved pulmonary edema. Thoracic aortic atherosclerosis. Electronically Signed   By: David  [place] M.D.   On: 11/12/2017 12:51   Dg Fluoro Guide Cv Line-no Report  Result Date: 11/12/2017 Fluoroscopy was utilized by the requesting physician.  No radiographic interpretation.    Medications: Infusions: . sodium chloride    . sodium chloride    . sodium chloride      Scheduled Medications: . albuterol      . allopurinol  100 mg Oral BID  . amiodarone  200 mg Oral Daily  . [START ON 11/13/2017] apixaban  5 mg Oral BID  . carvedilol  6.25 mg Oral BID WC  . isosorbide-hydrALAZINE  1 tablet Oral TID  . sodium chloride flush  3 mL Intravenous Q12H  . sodium polystyrene  30 g Oral Once    have reviewed scheduled and prn medications.  Physical Exam: General: Mild somnolent however able to answer question. Heart: Regular rate rhythm S1-S2 normal Lungs: Distant breath sound with no wheezing Abdomen: Soft, mild distention Extremities: Bilateral lower extremities pitting and chronic edema Dialysis Access: Right IJ tunnel catheter site looks clean.  Right AVF site has good thrill  Ahmaud Duthie Jaynie Collins 11/12/2017,5:25 PM  LOS: 8 days

## 2017-11-12 NOTE — Procedures (Signed)
Central Venous Catheter Insertion Procedure Note Eric Murillo [id number] [birth year]  Procedure: Insertion of Central Venous Catheter Indications: Drug and/or fluid administration  Procedure Details Consent: Unable to obtain consent because of emergent medical necessity. Time Out: Verified patient identification, verified procedure, site/side was marked, verified correct patient position, special equipment/implants available, medications/allergies/relevent history reviewed, required imaging and test results available.  Performed  Maximum sterile technique was used including antiseptics, cap, gloves, gown, hand hygiene, mask and sheet. Skin prep: Chlorhexidine; local anesthetic administered A antimicrobial bonded/coated triple lumen catheter was placed in the left femoral vein due to emergent situation using the Seldinger technique.  Evaluation Blood flow good Complications: No apparent complications Patient did tolerate procedure well.  Arvilla Meres MD 11/12/2017, 9:31 PM

## 2017-11-12 NOTE — Interval H&P Note (Signed)
History and Physical Interval Note:  11/12/2017 7:34 AM  Eric Murillo  has presented today for surgery, with the diagnosis of ESRD  The various methods of treatment have been discussed with the patient and family. After consideration of risks, benefits and other options for treatment, the patient has consented to  Procedure(s): RIGHT RADIAL CEPHALIC VS BRACHIAL CEPHALIC DIALYSIS CATHETER PLACEMENT (Right) as a surgical intervention .  The patient's history has been reviewed, patient examined, no change in status, stable for surgery.  I have reviewed the patient's chart and labs.  Questions were answered to the patient's satisfaction.     Fabienne Bruns

## 2017-11-12 NOTE — Consult Note (Signed)
PULMONARY / CRITICAL CARE MEDICINE   Name: Eric Murillo MRN: [id number] DOB: 1960-08-26    ADMISSION DATE:  11/03/2017 CONSULTATION DATE:  4/22  REFERRING MD:  Dr. Jearld Pies  CHIEF COMPLAINT:  Cardiac arrest  HISTORY OF PRESENT ILLNESS:   57 year old male with PMH as below, which is significant for atrial fibrillation(recurrent despite amiodarone and multiple DCCV. Anticoagulated with eliquis), Systolic CHF secondary to NICM (LVEF 20-25%, cardiac amyloid workup negative),  CKD IV, OSA (untreated due to severe claustrophobia), and morbid obesity. He presented with flu like symptoms on 4/13, and was found to be in atrial fibrillation with profound volume overload. Renal failure had worsened from baseline. He was admitted and unfortunately failed attempts at diuresis by 4/16. He had dialysis catheter placed 4/18 and was started on HD 4/19. He had been tolerating HD well and was taken for RUE fistula placement 4/22. Later that evening he developed worsening agitation and hypoxia. He eventually entered a wide complex tachycardia prompting approximately 10 mins ACLS including several defibrillations. This was felt to have taken place due to electrolyte abnormalities. He was intubated and transferred to ICU. PCCM asked to see for vent/medical management.   PAST MEDICAL HISTORY :  He  has a past medical history of AICD (automatic cardioverter/defibrillator) present, Anemia of chronic disease, Arthritis, Atrial flutter (HCC), Chest pain on exertion, Chronic systolic congestive heart failure, NYHA class 2 (HCC), CKD (chronic kidney disease) stage 3, GFR 30-59 ml/min (HCC), Dyslipidemia, Gout, Hyperglycemia, Hyperlipidemia, Hypertension, Morbid obesity with BMI of 45.0-49.9, adult (HCC), Non-ischemic cardiomyopathy (HCC), Organic erectile dysfunction, OSA (obstructive sleep apnea), Pilonidal cyst, and Submandibular sialolithiasis.  PAST SURGICAL HISTORY: He  has a past surgical history that includes  Colonoscopy; Salivary gland surgery (09/12/2012); Cardiac catheterization (2012); Submandibular gland excision (Right, 09/12/2012); TEE without cardioversion (N/A, 04/03/2014); Cardioversion (N/A, 04/03/2014); left and right heart catheterization with coronary angiogram (N/A, 04/24/2014); Cardiac catheterization (N/A, 05/21/2015); Cardioversion (N/A, 03/09/2016); TEE without cardioversion (N/A, 03/09/2016); TEE without cardioversion (N/A, 06/06/2017); Cardioversion (N/A, 06/06/2017); IR Fluoro Guide CV Line Right (11/08/2017); and IR US Guide Vasc Access Right (11/08/2017).  Allergies  Allergen Reactions  . Nsaids Other (See Comments)    Cannot take to due kidney issues  . Beta Adrenergic Blockers Other (See Comments)    "An unnamed, white-colored Beta Blocker made" him "feel funny" = made him feel cagey    No current facility-administered medications on file prior to encounter.    Current Outpatient Medications on File Prior to Encounter  Medication Sig  . acetaminophen (TYLENOL) 500 MG tablet Take 2 tablets (1,000 mg total) by mouth every 8 (eight) hours as needed. (Patient taking differently: Take 1,000 mg every 8 (eight) hours as needed by mouth (for pain or headaches). )  . allopurinol (ZYLOPRIM) 100 MG tablet TAKE 1 TABLET BY MOUTH TWO TIMES DAILY  . amiodarone (PACERONE) 200 MG tablet Take 1 tablet (200 mg total) by mouth daily. TAKE 2 TABLETS DAILY UNTIL 06/14/2017, and then resume 1 daily (Patient taking differently: Take 200 mg by mouth daily. )  . carvedilol (COREG) 6.25 MG tablet Take 1 tablet (6.25 mg total) 2 (two) times daily with a meal by mouth.  . colchicine 0.6 MG tablet Take 0.6 mg 2 (two) times daily as needed by mouth (FOR GOUT FLARES). Reported on 01/18/2016  . diclofenac sodium (VOLTAREN) 1 % GEL Apply 4 g topically 4 (four) times daily.  Marland Kitchen ELIQUIS 5 MG TABS tablet TAKE ONE TABLET BY MOUTH TWICE DAILY  . isosorbide-hydrALAZINE (  BIDIL) 20-37.5 MG tablet Take 1 tablet 3 (three) times  daily by mouth.  . metolazone (ZAROXOLYN) 2.5 MG tablet Take 1 tablet (2.5 mg total) by mouth once a week. Every Wednesday, take extra as directed  . potassium chloride SA (KLOR-CON M20) 20 MEQ tablet Take 2 tablets (40 mEq total) by mouth 3 (three) times daily.  Marland Kitchen torsemide (DEMADEX) 20 MG tablet Take 5 tablets (100 mg total) by mouth 2 (two) times daily.    FAMILY HISTORY:  His indicated that his mother is alive. He indicated that his father is deceased. He indicated that his sister is alive. He indicated that his brother is alive. He indicated that his maternal grandmother is deceased. He indicated that his maternal grandfather is deceased. He indicated that his paternal grandmother is deceased. He indicated that his paternal grandfather is deceased. He indicated that the status of his maternal aunt is unknown. He indicated that the status of his neg hx is unknown.   SOCIAL HISTORY: He  reports that he has never smoked. He has never used smokeless tobacco. He reports that he does not drink alcohol or use drugs.  REVIEW OF SYSTEMS:   Unable as patient is intubated  SUBJECTIVE:    VITAL SIGNS: BP 123/77 (BP Location: Left Arm)   Pulse 79   Temp 98 F (36.7 C) (Axillary)   Resp 18   Ht 5' 9.5" (1.765 m)   Wt (!) 138.9 kg (306 lb 4.8 oz)   SpO2 100%   BMI 44.58 kg/m   HEMODYNAMICS:    VENTILATOR SETTINGS: Vent Mode: PRVC FiO2 (%):  [100 %] 100 % Set Rate:  [22 bmp] 22 bmp Vt Set:  [570 mL] 570 mL PEEP:  [5 cmH20] 5 cmH20 Plateau Pressure:  [21 cmH20] 21 cmH20  INTAKE / OUTPUT: I/O last 3 completed shifts: In: 960 [P.O.:460; I.V.:500] Out: 2210 [Urine:200; Other:2000; Blood:10]  PHYSICAL EXAMINATION: General:  Morbidly obese male in NAD Neuro:  Awake on vent. RASS 0 HEENT:  [place]/AT, PERRL, no appreciable JVD Cardiovascular:  IRIR, no MRG Lungs:  Clear Abdomen:  Soft, non-tender, non-distended Musculoskeletal:  No acute deformity Skin:  Grossly intact.    LABS:  BMET Recent Labs  Lab 11/11/17 0753 11/12/17 1104 11/12/17 1929  NA 140 137 136  K 4.9 6.2* 6.1*  CL 100* 101 98*  CO2 28 25 22   BUN 60* 48* 57*  CREATININE 3.81* 4.31* 5.66*  GLUCOSE 100* 105* 213*    Electrolytes Recent Labs  Lab 11/08/17 1402  11/10/17 1930 11/11/17 0753 11/12/17 1104 11/12/17 1929  CALCIUM 8.9   < > 8.5* 8.8* 9.0 9.3  MG  --   --   --   --   --  2.6*  PHOS 6.2*  --  3.3 3.3  --   --    < > = values in this interval not displayed.    CBC Recent Labs  Lab 11/11/17 0753 11/12/17 1104 11/12/17 1929  WBC 12.9* 11.5* 11.9*  HGB 10.6* 11.7* 11.6*  HCT 36.7* 39.7 40.2  PLT 213 176 156    Coag's Recent Labs  Lab 11/09/17 0034 11/12/17 1104 11/12/17 1929  INR 1.85 1.53 1.70    Sepsis Markers Recent Labs  Lab 11/12/17 1936  LATICACIDVEN 5.7*    ABG No results for input(s): PHART, PCO2ART, PO2ART in the last 168 hours.  Liver Enzymes Recent Labs  Lab 11/10/17 1930 11/11/17 0753 11/12/17 1929  AST  --   --  77*  ALT  --   --  50  ALKPHOS  --   --  182*  BILITOT  --   --  2.1*  ALBUMIN 3.4* 3.7 3.3*    Cardiac Enzymes No results for input(s): TROPONINI, PROBNP in the last 168 hours.  Glucose No results for input(s): GLUCAP in the last 168 hours.  Imaging Dg Chest Port 1 View  Result Date: 11/12/2017 CLINICAL DATA:  Status post dialysis catheter insertion. EXAM: PORTABLE CHEST 1 VIEW COMPARISON:  Chest x-ray of November 03, 2017 FINDINGS: The dual-lumen dialysis catheter tip projects over the distal third of the SVC. There is no postprocedure pneumothorax or hemothorax. Both lungs are mildly hypoinflated. The cardiac silhouette is enlarged and the central pulmonary vascularity is engorged. The interstitial edema has improved. The ICD is in stable position. There is calcification in the wall of the aortic arch. IMPRESSION: No postprocedure complication following dialysis catheter placement. Improved pulmonary edema.  Thoracic aortic atherosclerosis. Electronically Signed   By: David  [place] M.D.   On: 11/12/2017 12:51   Dg Fluoro Guide Cv Line-no Report  Result Date: 11/12/2017 Fluoroscopy was utilized by the requesting physician.  No radiographic interpretation.     STUDIES:    CULTURES:   ANTIBIOTICS:   SIGNIFICANT EVENTS: Cefazolin 4/22 periop  LINES/TUBES: L fem CVL 4/22 > Perm cath RIJ 4/22 > Radial art 4/22 > ETT 4/22 >  DISCUSSION: 57 year old male with AF, CKD, and Acute on chronic CHF. Admitted with volume overload and was has evolved into now ESRD. Diuresis ineffective. Started HD. Had per access placed 4/22. Later that evening he had wide complex tachycardia felt to be secondary to electrolyte abnormalities (k 6.1). ACLS ~ 10 mins. Intubated and transferred to the ICU for CRRT.   ASSESSMENT / PLAN:  PULMONARY A: Acute hypoxemic respiratory failure OSA (untreated)  P:   Continue vent support SBT in AM VAP bundle Follow ABG, CXR  CARDIOVASCULAR A:  Acute on chronic systolic CHF Atrial fibrillation, rate controlled Cardiac arrhythmia felt to be WCT that degenerated into asystole. Then VF prompting defibrillation.   P:  Management per advanced heart failure service On norepi, weaning dose.   RENAL A:   ESRD Hyperkalemia Metabolic acidosis  P:   Nephrology following On CRRT Follow renal function panel.   GASTROINTESTINAL A:   Nutrition  P:   NPO for now  HEMATOLOGIC A:   Anemia: mild, Hgb at baseline  P:  Follow CBC Eliquis held for surgery, restart per primary team.   INFECTIOUS A:   No acute issues  P:   Periop antibiotics  ENDOCRINE A:   Hyperglycemia with no history of DM    P:   CBG monitoring, SSI  NEUROLOGIC A:   Acute metabolic encephalopathy has been awake and following commands post arrest.  P:   RASS goal: -1 to -2 Propofol infusion for RASS goal   FAMILY  - Updates: family updated  - Inter-disciplinary family  meet or Palliative Care meeting due by:  4/28    Joneen Roach, AGACNP-BC [facility] Pager [phone] or [phone]  11/12/2017 9:43 PM

## 2017-11-12 NOTE — Progress Notes (Signed)
Pt is in procedure for HD catheter insertion and will ck later as time and pt allow.     11/12/17 1100  PT Visit Information  Last PT Received On 11/12/17  Reason Eval/Treat Not Completed Patient at procedure or test/unavailable    Samul Dada, PT MS Acute Rehab Dept. Number: [facility] [id number] and [facility] [phone]

## 2017-11-12 NOTE — Progress Notes (Signed)
Patient refused bed alarm.   Elsie Lincoln, RN

## 2017-11-12 NOTE — Progress Notes (Addendum)
Patient ID: Eric Murillo, male   DOB: 17-Dec-1960, 57 y.o.   MRN: [id number]     Advanced Heart Failure Rounding Note  PCP-Cardiologist: [name], MD   Subjective:    HD yesterday with 2.2 L off. Down 2 lbs. K 6.2 this am.  Micah Flesher for tunneled HD cath and AV fistula today.     Drowsy b ut agitated at times. Denies CP or SOB. Wants to to sit up   Objective:   Weight Range: (!) 306 lb 4.8 oz (138.9 kg) Body mass index is 44.58 kg/m.   Vital Signs:   Temp:  [97 F (36.1 C)-99.2 F (37.3 C)] 97.3 F (36.3 C) (04/22 1355) Pulse Rate:  [63-95] 75 (04/22 1425) Resp:  [10-30] 28 (04/22 1425) BP: (97-130)/(58-90) 119/90 (04/22 1425) SpO2:  [89 %-99 %] 94 % (04/22 1425) Weight:  [306 lb 4.8 oz (138.9 kg)] 306 lb 4.8 oz (138.9 kg) (04/22 0556) Last BM Date: 11/09/17  Weight change: Filed Weights   11/11/17 0915 11/11/17 1254 11/12/17 0556  Weight: (!) 313 lb 0.9 oz (142 kg) (!) 308 lb 10.3 oz (140 kg) (!) 306 lb 4.8 oz (138.9 kg)    Intake/Output:   Intake/Output Summary (Last 24 hours) at 11/12/2017 1459 Last data filed at 11/12/2017 1056 Gross per 24 hour  Intake 720 ml  Output 10 ml  Net 710 ml      Physical Exam   General: Drowsy. Agitated at times wants to sit up No resp difficulty. HEENT: Normal anicteris  Neck: Supple. JVP elevated.. Carotids 2+ bilat; no  bruits. No thyromegaly or nodule noted. Right IJ HD catheter Cor: PMI nondisplaced. RRR, No M/G/R noted R chest tunneled cath site ok  Lungs: CTAB, normal effort. Abdomen: obese  Soft, non-tender, ++distended, no HSM. No bruits or masses. +BS  Extremities: No cyanosis, clubbing, or rash. R and LLE 2-3+ edema, AV fistula right AC Neuro: Alert & orientedx3, cranial nerves grossly intact. moves all 4 extremities w/o difficulty. Affect pleasant    Telemetry   NSR with IVCD 70s. Personally reviewed.   Labs    CBC Recent Labs    11/10/17 0558 11/11/17 0753 11/12/17 1104  WBC 13.6* 12.9* 11.5*    NEUTROABS 10.1*  --   --   HGB 10.4* 10.6* 11.7*  HCT 35.5* 36.7* 39.7  MCV 89.0 88.9 89.2  PLT 241 213 176   Basic Metabolic Panel Recent Labs    64/15/83 1930 11/11/17 0753 11/12/17 1104  NA 137 140 137  K 3.7 4.9 6.2*  CL 95* 100* 101  CO2 28 28 25   GLUCOSE 126* 100* 105*  BUN 69* 60* 48*  CREATININE 3.50* 3.81* 4.31*  CALCIUM 8.5* 8.8* 9.0  PHOS 3.3 3.3  --    Liver Function Tests Recent Labs    11/10/17 1930 11/11/17 0753  ALBUMIN 3.4* 3.7   No results for input(s): LIPASE, AMYLASE in the last 72 hours. Cardiac Enzymes No results for input(s): CKTOTAL, CKMB, CKMBINDEX, TROPONINI in the last 72 hours.  BNP: BNP (last 3 results) Recent Labs    06/05/17 0301 07/26/17 0916 11/03/17 1238  BNP 672.6* 399.7* 1,160.1*    ProBNP (last 3 results) No results for input(s): PROBNP in the last 8760 hours.   D-Dimer No results for input(s): DDIMER in the last 72 hours. Hemoglobin A1C No results for input(s): HGBA1C in the last 72 hours. Fasting Lipid Panel No results for input(s): CHOL, HDL, LDLCALC, TRIG, CHOLHDL, LDLDIRECT in the last 72  hours. Thyroid Function Tests No results for input(s): TSH, T4TOTAL, T3FREE, THYROIDAB in the last 72 hours.  Invalid input(s): FREET3  Other results:   Imaging    Dg Chest Port 1 View  Result Date: 11/12/2017 CLINICAL DATA:  Status post dialysis catheter insertion. EXAM: PORTABLE CHEST 1 VIEW COMPARISON:  Chest x-ray of November 03, 2017 FINDINGS: The dual-lumen dialysis catheter tip projects over the distal third of the SVC. There is no postprocedure pneumothorax or hemothorax. Both lungs are mildly hypoinflated. The cardiac silhouette is enlarged and the central pulmonary vascularity is engorged. The interstitial edema has improved. The ICD is in stable position. There is calcification in the wall of the aortic arch. IMPRESSION: No postprocedure complication following dialysis catheter placement. Improved pulmonary edema.  Thoracic aortic atherosclerosis. Electronically Signed   By: David  [place] M.D.   On: 11/12/2017 12:51   Dg Fluoro Guide Cv Line-no Report  Result Date: 11/12/2017 Fluoroscopy was utilized by the requesting physician.  No radiographic interpretation.     Medications:     Scheduled Medications: . albuterol      . allopurinol  100 mg Oral BID  . amiodarone  200 mg Oral Daily  . carvedilol  6.25 mg Oral BID WC  . isosorbide-hydrALAZINE  1 tablet Oral TID  . sodium chloride flush  3 mL Intravenous Q12H    Infusions: . sodium chloride    . sodium chloride    . sodium chloride      PRN Medications: sodium chloride, sodium chloride, sodium chloride, acetaminophen, acetaminophen, alteplase, heparin, HYDROcodone-homatropine, lidocaine (PF), lidocaine (PF), lidocaine-prilocaine, ondansetron (ZOFRAN) IV, oxyCODONE-acetaminophen, pentafluoroprop-tetrafluoroeth, sodium chloride flush  Assessment/Plan   1. Acute on chronic systolic CHF: Nonischemic cardiomyopathy x years. Last echo in 11/18 with moderate LV dilation, EF 20-25%, mildly decreased RV systolic function.  He had a workup for cardiac amyloidosis in the past.  However, negative SPEP and abdominal fat pad biopsy negative. Low voltage on ECG may be due to obesity and not amyloidosis. CKD stage IV makes management of volume difficult.  Volume status overloaded.  - Lasix drip and metolazone DCd 4/19 once HD started per nephrology. - Continue current Coreg and Bidil doses, BP stable.    - No ACEI/ARB/ARNI/spironolactone with CKD IV.  - Has received HD last 4 days.  2. Atypical atrial flutter: Recurrent. Most recent DCCV in 11/18.  Saw Dr Elberta Fortis, not thought to be a good ablation candidate.  - Maintaining NSR    - Continue amiodarone 200 mg daily - Apixiban has been on hold for HD cath. Will restart tomorrow per vascular.  3. AKI on CKD stage IV: BUN/creatinine have been rising. Vein mapping done and VVS consulted.  Nephrology  appreciated.  - Tunneled HD cath and AVF placed today.  - Has received HD x 4 days, none today.  4. OSA: Has been unable to tolerate CPAP. No change.  5. Morbid obesity-Body mass index is 44.58 kg/m. 6. Hyperkalemia - K 6.2. Supplement DC'd. Will give dose of kayexalate. HD tomorrow.   Length of Stay: 8  Alford Highland, NP  11/12/2017, 2:59 PM  Advanced Heart Failure Team Pager [phone] (M-F; 7a - 4p)  Please contact [facility] for night-coverage after hours (4p -7a ) and weekends on [email or web address]  Patient seen and examined with the above-signed Advanced Practice Provider and/or Housestaff. I personally reviewed laboratory data, imaging studies and relevant notes. I independently examined the patient and formulated the important aspects of the plan. I have edited the  note to reflect any of my changes or salient points. I have personally discussed the plan with the patient and/or family.  He is just back from AV fistula and tunneled cath placement. He is somewhat agitated. Also volume overloaded. Will give kayexalate now. Will likely need HD soon. Remaining in NSR. Overall quite tenuous. D/w wife at bedside.   Arvilla Meres, MD  9:25 PM

## 2017-11-12 NOTE — Anesthesia Procedure Notes (Signed)
Procedure Name: Intubation Date/Time: 11/12/2017 8:17 AM Performed by: Marny Lowenstein, CRNA Pre-anesthesia Checklist: Patient identified, Emergency Drugs available, Suction available, Patient being monitored and Timeout performed Patient Re-evaluated:Patient Re-evaluated prior to induction Oxygen Delivery Method: Circle system utilized Preoxygenation: Pre-oxygenation with 100% oxygen Induction Type: IV induction Ventilation: Mask ventilation without difficulty Laryngoscope Size: Miller and 2 Grade View: Grade I Tube type: Oral Tube size: 8.0 mm Number of attempts: 1 Airway Equipment and Method: Patient positioned with wedge pillow Placement Confirmation: ETT inserted through vocal cords under direct vision,  positive ETCO2,  CO2 detector and breath sounds checked- equal and bilateral Secured at: 23 cm Tube secured with: Tape Dental Injury: Teeth and Oropharynx as per pre-operative assessment

## 2017-11-12 NOTE — Progress Notes (Signed)
I was called to inform that pt had wide complex tachycardia on 3E felt to be due to hyperkalemia- he is now in 2H- s/p code, intubated and on levophed, will need CRRT- nursing informed and orders put in.  Will start with all zero K dialysate and no volume removal - will use heparin  Labs ordered for 1 AM with sliding scale on what to do depending on potassium   Sho Salguero A

## 2017-11-12 NOTE — Progress Notes (Addendum)
Paged to see patient with hypoxia.   Run of Wide complex tachycardia noted on tele. Symptomology difficult to determine with confounding hypoxia. Pt has electrolyte abnormalities that are currently being managed. Will follow.   On my arrival O2 sat 79% on RA. Discussed with patient need for O2, and if not [place] would need BiPAP.  Pt tolerated [place] and Pulse Ox improved to > 95%. Remained SOB.     Remains markedly volume overloaded on exam. Now s/p HD cath placement.  Spoke personally with Dr. Ronalee Belts who is going to see the patient and possibly dialyze this evening.   Casimiro Needle [address]" [place], [place] 11/12/2017 5:42 PM

## 2017-11-12 NOTE — Transfer of Care (Signed)
Immediate Anesthesia Transfer of Care Note  Patient: Eric Murillo  Procedure(s) Performed: PLACEMENT of right internal jugualr tunneled DIALYSIS CATHETER with removal or temporary dialysis catheter (Right Neck) CREATION of RIGHT BRACHIAL CEPHALIC ARTERIOVENOUS (AV) FISTULA (Right Arm Upper)  Patient Location: PACU  Anesthesia Type:General  Level of Consciousness: drowsy  Airway & Oxygen Therapy: Patient Spontanous Breathing and Patient connected to face mask oxygen  Post-op Assessment: Report given to RN and Post -op Vital signs reviewed and stable  Post vital signs: Reviewed and stable  Last Vitals:  Vitals Value Taken Time  BP 107/76 11/12/2017 10:54 AM  Temp 36.1 C 11/12/2017 10:54 AM  Pulse 86 11/12/2017 10:56 AM  Resp 10 11/12/2017 10:56 AM  SpO2 94 % 11/12/2017 10:56 AM  Vitals shown include unvalidated device data.  Last Pain:  Vitals:   11/12/17 0559  TempSrc: Oral  PainSc:       Patients Stated Pain Goal: 2 (11/07/17 1611)  Complications: No apparent anesthesia complications

## 2017-11-12 NOTE — Progress Notes (Signed)
Patient has been drowsy with mild agitation since 1500 when he got back from PACU.  Patient refusing to keep nasal canula on.  CCMD reported abnormal telemetry.  Nurse paged CHF PA to report abnormal rhythm.  Waiting for call back. Elnita Maxwell, RN

## 2017-11-12 NOTE — Progress Notes (Signed)
    Received a page regarding pt worsening condition at approximately 645pm from Rapid Response RN stating that the patient was more agitated with AMS. Pt had an ultrasound-guided insertion of right IJ catheter, removal temporary dialysis catheter and placement of right brachial cephalic AVF today, 11/12/17 per Dr. Darrick Penna. Pt returned to the nursing floor at about 3pm. From that time, the pt was reported as more lethargic and agitated. CHF PA called to the bedside at approximately 1740pm given wide complex tachycardia on telemetry and reports of hypoxemia. Pt was noted to have significant electrolyte abnormalities that were being managed per nephrology. PA spoke with nephrology to notify that there may be a need to dialyze him tonight given fluid volume overload and hyperkalemia of 6.2.  Given the above information during the call, reported to bedside to find that a Code Blue had been called. Upon arrival, ACLS measures were being taken per code team. Notified on-call Cardiology MD who reported to bedside. Pt noted to have wide complex tachycardia once again per [place] telemetry. He was shocked several times. Pt was intubated per anesthesia. Orders placed to transfer to ICU. Called and spoke with Dr. Kathrene Bongo who was informed of the patients status and possible need for emergent HD at bedside. She has agreed to go see him for further assessment. CHF team notified as well. He will need PCCM consult for vent management.    Georgie Chard NP-C [facility] Pager: [phone]

## 2017-11-12 NOTE — Significant Event (Signed)
Rapid Response Event Note RN called regarding change in mental status  Overview: Time Called: 1826 Arrival Time: 1830 Event Type: Neurologic  Initial Focused Assessment: On arrival pt lying diagonal in bed, using abd muscles to breathe, taking off his [place], sats 71-76% on RA. Pt pulled himself up in the bed, I went to get a NRB, returned to room and noted pt was guppy breathing, unable to palpate a pulse, code blue was called, CPR initiated. Please see code blue sheet Transported to 2H10 Dr. Teressa Lower assisted with transport     Event Summary: Name of Physician Notified: Noreene Larsson PA  at 1835    at    Outcome: Transferred (Comment)  Event End Time: 2005  Gerarda Fraction

## 2017-11-12 NOTE — Op Note (Signed)
Procedure: Ultrasound-guided insertion of Palidrome catheter right internal jugular vein, removal temporary dialysis catheter, place right brachial cephalic AVF  Preoperative diagnosis: End-stage renal disease  Postoperative diagnosis: Same  Anesthesia: General  Operative findings: 23 cm Palindrome catheter right internal jugular vein  Asst: Samantha Rhyne PA-c  Operative details: After obtaining informed consent, the patient was taken to the operating room. The patient was placed in supine position on the operating room table. After adequate sedation the patient's entire neck and chest were prepped and draped in usual sterile fashion. The patient was placed in Trendelenburg position. Ultrasound was used to identify the patient's right internal jugular vein. This had normal compressibility and respiratory variation. Local anesthesia was infiltrated over the right jugular vein.  Using ultrasound guidance, the right internal jugular vein was successfully cannulated.  A 0.035 J-tipped guidewire was threaded into the right internal jugular vein and into the superior vena cava followed by the inferior vena cava under fluoroscopic guidance.   Next sequential 12 and 14 dilators were placed over the guidewire into the right atrium.  A 16 French dilator with a peel-away sheath was then placed over the guidewire into the right atrium.   The guidewire and dilator were removed. A 23 cm Palindrome catheter was then placed through the peel away sheath into the right atrium.  The catheter was then tunneled subcutaneously, cut to length, and the hub attached. The catheter was noted to flush and draw easily. The catheter was inspected under fluoroscopy and found with its tip to be in the right atrium without any kinks throughout its course. The catheter was sutured to the skin with nylon sutures. The neck insertion site was closed with Vicryl stitch. The catheter was then loaded with concentrated Heparin solution. A dry  sterile dressing was applied.  The temporary catheter on the right side was then removed and the insertion site closed with a nylon stitch.  Pressure was held 5 minutes to obtain hemostasis.  Next, the right upper extremity was prepped and draped in usual sterile fashion.  A transverse incision was then made near the antecubital crease the right arm. The incision was carried into the subcutaneous tissues down to level of the cephalic vein. The cephalic vein was approximately 3.5 mm in diameter. It was of good quality. This was dissected free circumferentially and small side branches ligated and divided between silk ties or clips. Next the brachial artery was dissected free in the medial portion of the incision. The artery was  2.5 mm in diameter. The vessel loops were placed proximal and distal to the planned site of arteriotomy. The patient was given 5000 units of intravenous heparin. After appropriate circulation time, the vessel loops were used to control the artery. A longitudinal opening was made in the brachial artery.  The vein was ligated distally with a 2-0 silk tie. The vein was controlled proximally with a fine bulldog clamp. The vein was then swung over to the artery and sewn end of vein to side of artery using a running 7-0 Prolene suture. Just prior to completion of the anastomosis, everything was fore bled back bled and thoroughly flushed. The anastomosis was secured, vessel loops released, and there was a palpable thrill in the fistula immediately. After hemostasis was obtained, the subcutaneous tissues were reapproximated using a running 3-0 Vicryl suture. The skin was then closed with a 4 0 Vicryl subcuticular stitch. Dermabond was applied to the skin incision.  The patient had audible radial doppler at the end  of the case.  The patient tolerated procedure well and there were no complications. Instrument sponge and needle counts correct in the case. The patient was taken to the recovery room in  stable condition. Chest x-ray will be obtained in the recovery room.  Fabienne Bruns, MD [facility] Office: [phone] Pager: [phone]

## 2017-11-12 NOTE — Procedures (Signed)
Radial Artery Catheter Insertion Procedure Note Eric Murillo [id number] 1961-07-11  Procedure: Insertion of Radial Artery Catheter Indications: Frequent blood sampling  Procedure Details Consent: Unable to obtain consent because of emergent medical necessity. Time Out: Verified patient identification, verified procedure, site/side was marked, verified correct patient position, special equipment/implants available, medications/allergies/relevent history reviewed, required imaging and test results available.  Performed  Maximum sterile technique was used including antiseptics, cap, gloves, gown, hand hygiene, mask and sheet. Skin prep: Chlorhexidine; local anesthetic administered A antimicrobial bonded/coated single lumen catheter was placed in the left radial artery using the Seldinger technique.  Evaluation Blood flow good Complications: No apparent complications Patient did tolerate procedure well.  Arvilla Meres MD 11/12/2017, 9:31 PM

## 2017-11-12 NOTE — Progress Notes (Signed)
   ADVANCED HF TEAM CODE NOTE   Called to patient's bedside for Code Blue. On my arrival patient intubated and receiving CPR. Turner at bedside and has reviewed tele.  Appears to have wide complex rhythm that degenerated into asystole.   Multiple rounds of epi and bicarb given. I gave one amp calcium. Patient then had ROSC for several minutes and then rhythm degenerated into VF. Shocked x 1.   Emergent femoral line placed at bedside. Norepi drip started. Additional calcium and d50/insulin given for presumed hyperkalemic triggered arrest.   Patient stabilized in room with help of RRT. I personally assisted in transferring to 2h10.   Renal notified for emergent CVVHD. CCM notified for help with vent management.    I placed left radial arterial line.   Patient started on CVVHD and was noted to move all extremitities and began to follow commands.    Family updated.   CRITICAL CARE Performed by: Arvilla Meres  Total critical care time: 70 minutes  Critical care time was exclusive of separately billable procedures and treating other patients.  Critical care was necessary to treat or prevent imminent or life-threatening deterioration.  Critical care was time spent personally by me (independent of midlevel providers or residents) on the following activities: development of treatment plan with patient and/or surrogate as well as nursing, discussions with consultants, evaluation of patient's response to treatment, examination of patient, obtaining history from patient or surrogate, ordering and performing treatments and interventions, ordering and review of laboratory studies, ordering and review of radiographic studies, pulse oximetry and re-evaluation of patient's condition.  Arvilla Meres, MD  9:30 PM

## 2017-11-12 NOTE — Progress Notes (Signed)
Responded to code blue pager.  Upon arrival on floor, saw male in hallway very visibly shaken by events of code blue.  Chaplain offered prayer, spiritual care and support to Jolivue, the patient's girlfriend as she called family.  Got comfort items through awsome support of staff.  Provided tissues, drink, blanket and other needed items to support her as she awaited news of patient progress.  Helped guide Angelique Blonder and other family members to [facility] waiting area when patient was moved, and informed staff of their location for updates.   family expressed their thanks for chaplains support.  Please page if further support can be provided.

## 2017-11-12 NOTE — Anesthesia Procedure Notes (Signed)
Procedure Name: Intubation Date/Time: 11/12/2017 6:50 PM Performed by: Lucinda Dell, CRNA Pre-anesthesia Checklist: Patient identified, Emergency Drugs available, Suction available and Patient being monitored Patient Re-evaluated:Patient Re-evaluated prior to induction Oxygen Delivery Method: Circle system utilized Preoxygenation: Pre-oxygenation with 100% oxygen Induction Type: IV induction Ventilation: Mask ventilation without difficulty Laryngoscope Size: Glidescope and 4 Tube type: Subglottic suction tube Tube size: 7.5 mm Number of attempts: 1 Airway Equipment and Method: Stylet and Video-laryngoscopy Placement Confirmation: ETT inserted through vocal cords under direct vision,  positive ETCO2 and breath sounds checked- equal and bilateral Secured at: 23 cm Tube secured with: Tape Dental Injury: Teeth and Oropharynx as per pre-operative assessment  Comments: Responded to code blue page. Patient had ROSC, no CPR in progress currently but ACLS protocol being carried out. Not breathing adequate tidal volumes and unresponsive. DL x 1 with Glidescope. +EtCO2, +BBS. Tube secured by RT and ventilator settings per RT. RN calling for sedation orders.

## 2017-11-12 NOTE — Progress Notes (Addendum)
Patient O2 SAT 79 room air.  Patient refuses oxygen. Nurse educated patient regarding low oxygen level.  Patient still refuses to have oxygen.  Girlfriend at bedside. Mardelle Matte PA called this nurse back, will be in to see patient. Elnita Maxwell, RN   [phone]: Nurse notified by patient's girlfriend that patient was agitated and trying to get out of bed.  Nurse found patient sitting at the edge of bed, almost sliding out of bed.  Nurse called for help to move patient.  Patient having difficulty breathing, but alert.  Patient refused to wear oxygen.  Patient scooted himself to the middle of the bed.  Charge nurse in room.   Patient having difficulty breathing.  Rapid response RN called. On call NP called and notified about  patient's condition.   Patient given one dose of narcan due to being drowsy from earlier sedation while having HD fistula placement.  Rapid response RN in room, noticed patient becoming unresponsive.   Patient has no palpable pulse, code blue called at 1830 and CPR initiated. Elnita Maxwell, RN

## 2017-11-12 NOTE — Progress Notes (Signed)
  Patient Name: Saxon Borth   MRN: [id number]   Date of Birth/ Sex: 07-14-1961 , male      Admission Date: 11/03/2017  Attending Provider: Laurey Morale, MD  Primary Diagnosis: <principal problem not specified>   Indication: Pt was in his usual state of health until this PM, when he was noted to be Unresponsive. Code blue was subsequently called. At the time of arrival on scene, ACLS protocol was underway.   Technical Description:  - CPR performance duration:  10+ Minutes  - Was defibrillation or cardioversion used? Yes   - Was external pacer placed? Yes  - Was patient intubated pre/post CPR? Yes   Medications Administered: Y = Yes; Blank = No Amiodarone    Atropine    Calcium  Y  Epinephrine  Y  Lidocaine    Magnesium    Norepinephrine    Phenylephrine    Sodium bicarbonate  Y  Vasopressin     Post CPR evaluation:  - Final Status - Was patient successfully resuscitated ? Yes - What is current rhythm? Wide Complex Tachycardia - What is current hemodynamic status? Tenuious  Miscellaneous Information:  - Labs sent, including: Renal function panel   - Primary team notified?  Yes  - Family Notified? Yes  - Additional notes/ transfer status: Heart failure attending arrived and assumed lead of the code     Beola Cord, MD  11/12/2017, 7:10 PM

## 2017-11-12 NOTE — Progress Notes (Signed)
Dr. Teressa Lower aware of ABG results.  1 amp of bicarb and rate on vent changed to 24 per [place].

## 2017-11-13 ENCOUNTER — Inpatient Hospital Stay (HOSPITAL_COMMUNITY): Payer: Medicaid Other

## 2017-11-13 ENCOUNTER — Encounter (HOSPITAL_COMMUNITY): Payer: Self-pay | Admitting: Vascular Surgery

## 2017-11-13 ENCOUNTER — Telehealth: Payer: Self-pay | Admitting: Vascular Surgery

## 2017-11-13 DIAGNOSIS — I472 Ventricular tachycardia: Secondary | ICD-10-CM

## 2017-11-13 DIAGNOSIS — N186 End stage renal disease: Secondary | ICD-10-CM

## 2017-11-13 DIAGNOSIS — E875 Hyperkalemia: Secondary | ICD-10-CM

## 2017-11-13 DIAGNOSIS — I340 Nonrheumatic mitral (valve) insufficiency: Secondary | ICD-10-CM

## 2017-11-13 DIAGNOSIS — R000 Tachycardia, unspecified: Secondary | ICD-10-CM

## 2017-11-13 DIAGNOSIS — I469 Cardiac arrest, cause unspecified: Secondary | ICD-10-CM

## 2017-11-13 DIAGNOSIS — J9601 Acute respiratory failure with hypoxia: Secondary | ICD-10-CM | POA: Diagnosis present

## 2017-11-13 DIAGNOSIS — I4891 Unspecified atrial fibrillation: Secondary | ICD-10-CM

## 2017-11-13 LAB — BASIC METABOLIC PANEL
ANION GAP: 14 (ref 5–15)
BUN: 58 mg/dL — AB (ref 6–20)
CO2: 22 mmol/L (ref 22–32)
Calcium: 9.1 mg/dL (ref 8.9–10.3)
Chloride: 100 mmol/L — ABNORMAL LOW (ref 101–111)
Creatinine, Ser: 4.75 mg/dL — ABNORMAL HIGH (ref 0.61–1.24)
GFR, EST AFRICAN AMERICAN: 14 mL/min — AB (ref >60)
GFR, EST NON AFRICAN AMERICAN: 12 mL/min — AB (ref >60)
Glucose, Bld: 158 mg/dL — ABNORMAL HIGH (ref 65–99)
Potassium: 5.2 mmol/L — ABNORMAL HIGH (ref 3.5–5.1)
SODIUM: 136 mmol/L (ref 135–145)

## 2017-11-13 LAB — GLUCOSE, CAPILLARY
GLUCOSE-CAPILLARY: 101 mg/dL — AB (ref 65–99)
GLUCOSE-CAPILLARY: 104 mg/dL — AB (ref 65–99)
GLUCOSE-CAPILLARY: 104 mg/dL — AB (ref 65–99)
Glucose-Capillary: 144 mg/dL — ABNORMAL HIGH (ref 65–99)
Glucose-Capillary: 108 mg/dL — ABNORMAL HIGH (ref 65–99)

## 2017-11-13 LAB — RENAL FUNCTION PANEL
ALBUMIN: 3.2 g/dL — AB (ref 3.5–5.0)
ANION GAP: 11 (ref 5–15)
ANION GAP: 13 (ref 5–15)
Albumin: 3.3 g/dL — ABNORMAL LOW (ref 3.5–5.0)
BUN: 55 mg/dL — ABNORMAL HIGH (ref 6–20)
BUN: 47 mg/dL — ABNORMAL HIGH (ref 6–20)
CALCIUM: 8.9 mg/dL (ref 8.9–10.3)
CO2: 24 mmol/L (ref 22–32)
CO2: 23 mmol/L (ref 22–32)
CREATININE: 4.16 mg/dL — AB (ref 0.61–1.24)
Calcium: 8.7 mg/dL — ABNORMAL LOW (ref 8.9–10.3)
Chloride: 101 mmol/L (ref 101–111)
Chloride: 100 mmol/L — ABNORMAL LOW (ref 101–111)
Creatinine, Ser: 3.53 mg/dL — ABNORMAL HIGH (ref 0.61–1.24)
GFR calc Af Amer: 21 mL/min — ABNORMAL LOW (ref >60)
GFR calc non Af Amer: 15 mL/min — ABNORMAL LOW (ref >60)
GFR calc non Af Amer: 18 mL/min — ABNORMAL LOW (ref >60)
GFR, EST AFRICAN AMERICAN: 17 mL/min — AB (ref >60)
GLUCOSE: 132 mg/dL — AB (ref 65–99)
GLUCOSE: 103 mg/dL — AB (ref 65–99)
PHOSPHORUS: 3.5 mg/dL (ref 2.5–4.6)
POTASSIUM: 5.3 mmol/L — AB (ref 3.5–5.1)
Phosphorus: 3 mg/dL (ref 2.5–4.6)
Potassium: 4.6 mmol/L (ref 3.5–5.1)
SODIUM: 136 mmol/L (ref 135–145)
SODIUM: 136 mmol/L (ref 135–145)

## 2017-11-13 LAB — POCT ACTIVATED CLOTTING TIME
ACTIVATED CLOTTING TIME: 131 s
ACTIVATED CLOTTING TIME: 142 s
ACTIVATED CLOTTING TIME: 153 s
ACTIVATED CLOTTING TIME: 158 s
ACTIVATED CLOTTING TIME: 158 s
ACTIVATED CLOTTING TIME: 158 s
Activated Clotting Time: 164 seconds
Activated Clotting Time: 169 seconds
Activated Clotting Time: 147 seconds

## 2017-11-13 LAB — MAGNESIUM: Magnesium: 2.4 mg/dL (ref 1.7–2.4)

## 2017-11-13 LAB — ECHOCARDIOGRAM COMPLETE
Height: 69.5 in
Weight: 4938.3 oz

## 2017-11-13 LAB — HEPARIN LEVEL (UNFRACTIONATED): HEPARIN UNFRACTIONATED: 0.76 [IU]/mL — AB (ref 0.30–0.70)

## 2017-11-13 LAB — TRIGLYCERIDES: Triglycerides: 86 mg/dL (ref <150)

## 2017-11-13 LAB — PHOSPHORUS: Phosphorus: 3.4 mg/dL (ref 2.5–4.6)

## 2017-11-13 LAB — LACTIC ACID, PLASMA
LACTIC ACID, VENOUS: 1.9 mmol/L (ref 0.5–1.9)
Lactic Acid, Venous: 1.8 mmol/L (ref 0.5–1.9)

## 2017-11-13 LAB — APTT: APTT: 62 s — AB (ref 24–36)

## 2017-11-13 LAB — BRAIN NATRIURETIC PEPTIDE: B Natriuretic Peptide: 1085.3 pg/mL — ABNORMAL HIGH (ref 0.0–100.0)

## 2017-11-13 LAB — PARATHYROID HORMONE, INTACT (NO CA): PTH: 278 pg/mL — AB (ref 15–65)

## 2017-11-13 MED ORDER — HEPARIN (PORCINE) IN NACL 100-0.45 UNIT/ML-% IJ SOLN
1200.0000 [IU]/h | INTRAMUSCULAR | Status: AC
  Administered 2017-11-13: 1450 [IU]/h via INTRAVENOUS
  Administered 2017-11-14 – 2017-11-15 (×2): 1100 [IU]/h via INTRAVENOUS
  Administered 2017-11-16: 1200 [IU]/h via INTRAVENOUS
  Filled 2017-11-13 (×4): qty 250

## 2017-11-13 MED ORDER — PERFLUTREN LIPID MICROSPHERE
1.0000 mL | INTRAVENOUS | Status: AC | PRN
Start: 2017-11-13 — End: 2017-11-13
  Administered 2017-11-13: 2 mL via INTRAVENOUS
  Filled 2017-11-13: qty 10

## 2017-11-13 MED ORDER — ORAL CARE MOUTH RINSE
15.0000 mL | OROMUCOSAL | Status: DC
  Administered 2017-11-13 – 2017-11-15 (×20): 15 mL via OROMUCOSAL

## 2017-11-13 MED ORDER — PRISMASOL BGK 4/2.5 32-4-2.5 MEQ/L IV SOLN
INTRAVENOUS | Status: DC
  Administered 2017-11-13 (×4): via INTRAVENOUS_CENTRAL
  Filled 2017-11-13 (×9): qty 5000

## 2017-11-13 MED ORDER — PRISMASOL BGK 4/2.5 32-4-2.5 MEQ/L IV SOLN
INTRAVENOUS | Status: DC
  Administered 2017-11-13: 07:00:00 via INTRAVENOUS_CENTRAL
  Filled 2017-11-13 (×2): qty 5000

## 2017-11-13 MED ORDER — PRISMASOL BGK 0/2.5 32-2.5 MEQ/L IV SOLN
INTRAVENOUS | Status: DC
  Administered 2017-11-13 – 2017-11-15 (×3): via INTRAVENOUS_CENTRAL
  Filled 2017-11-13 (×6): qty 5000

## 2017-11-13 MED ORDER — PRISMASOL BGK 0/2.5 32-2.5 MEQ/L IV SOLN
INTRAVENOUS | Status: DC
  Administered 2017-11-13 – 2017-11-16 (×18): via INTRAVENOUS_CENTRAL
  Filled 2017-11-13 (×22): qty 5000

## 2017-11-13 MED ORDER — FAMOTIDINE IN NACL 20-0.9 MG/50ML-% IV SOLN
20.0000 mg | INTRAVENOUS | Status: DC
  Administered 2017-11-13 – 2017-11-15 (×3): 20 mg via INTRAVENOUS
  Filled 2017-11-13 (×3): qty 50

## 2017-11-13 MED ORDER — PRISMASOL BGK 0/2.5 32-2.5 MEQ/L IV SOLN
INTRAVENOUS | Status: DC
  Administered 2017-11-13 – 2017-11-15 (×4): via INTRAVENOUS_CENTRAL
  Filled 2017-11-13 (×6): qty 5000

## 2017-11-13 MED FILL — Medication: Qty: 1 | Status: AC

## 2017-11-13 NOTE — Progress Notes (Signed)
   Daily Progress Note   Assessment/Planning:   POD #1 s/p R BC AVF, RIJV TDC   Significant medical hurdles ahead  Nothing more to add to care from a Vascular view-point  Follow up in the office in 6 weeks to check on maturation of fistula    Subjective  - 1 Day Post-Op   Events overnight noted, pt intubated   Objective   Vitals:   11/13/17 0400 11/13/17 0500 11/13/17 0600 11/13/17 0700  BP: (!) 148/79 137/81 118/67 110/78  Pulse:      Resp: (!) 24 (!) 24 (!) 24 (!) 21  Temp: (!) 95.9 F (35.5 C)     TempSrc: Axillary     SpO2: 100% 100% 100% 100%  Weight:   (!) 308 lb 10.3 oz (140 kg)   Height:         Intake/Output Summary (Last 24 hours) at 11/13/2017 0729 Last data filed at 11/13/2017 0700 Gross per 24 hour  Intake 959.36 ml  Output 402 ml  Net 557.36 ml    VASC RIJV TDC in place, no PTX on CXR, tip in SVC/RA, R arm fistula with faint thrill and bruit  NEURO Somewhat confused, moving R arm spontaneously    Laboratory   CBC CBC Latest Ref Rng & Units 11/12/2017 11/12/2017 11/11/2017  WBC 4.0 - 10.5 K/uL 11.9(H) 11.5(H) 12.9(H)  Hemoglobin 13.0 - 17.0 g/dL 11.6(L) 11.7(L) 10.6(L)  Hematocrit 39.0 - 52.0 % 40.2 39.7 36.7(L)  Platelets 150 - 400 K/uL 156 176 213    BMET    Component Value Date/Time   NA 136 11/13/2017 0400   NA 143 02/07/2017 1502   K 4.6 11/13/2017 0400   CL 101 11/13/2017 0400   CO2 24 11/13/2017 0400   GLUCOSE 132 (H) 11/13/2017 0400   BUN 55 (H) 11/13/2017 0400   BUN 33 (H) 02/07/2017 1502   CREATININE 4.16 (H) 11/13/2017 0400   CREATININE 1.44 (H) 05/14/2015 0959   CALCIUM 8.9 11/13/2017 0400   GFRNONAA 15 (L) 11/13/2017 0400   GFRNONAA 65 07/10/2013 1604   GFRAA 17 (L) 11/13/2017 0400   GFRAA 75 07/10/2013 1604     Leonides Sake, MD, FACS [facility] [place] Office: [phone] Pager: [phone]  11/13/2017, 7:29 AM

## 2017-11-13 NOTE — Progress Notes (Signed)
ANTICOAGULATION CONSULT NOTE - Follow Up Consult  Pharmacy Consult for heparin Indication: atrial fibrillation  Allergies  Allergen Reactions  . Nsaids Other (See Comments)    Cannot take to due kidney issues  . Beta Adrenergic Blockers Other (See Comments)    "An unnamed, white-colored Beta Blocker made" him "feel funny" = made him feel cagey    Patient Measurements: Height: 5' 9.5" (176.5 cm) Weight: (!) 308 lb 10.3 oz (140 kg) IBW/kg (Calculated) : 71.85 Heparin Dosing Weight: 104.9 kg  Vital Signs: Temp: 98.4 F (36.9 C) (04/23 1130) Temp Source: Oral (04/23 1130) BP: 136/88 (04/23 1900) Pulse Rate: 72 (04/23 1542)  Labs: Recent Labs    11/11/17 0753 11/12/17 1104 11/12/17 1929 11/13/17 0055 11/13/17 0400 11/13/17 1640 11/13/17 1830  HGB 10.6* 11.7* 11.6*  --   --   --   --   HCT 36.7* 39.7 40.2  --   --   --   --   PLT 213 176 156  --   --   --   --   APTT  --   --   --   --  62*  --   --   LABPROT  --  18.2* 19.9*  --   --   --   --   INR  --  1.53 1.70  --   --   --   --   HEPARINUNFRC  --   --   --   --   --   --  0.76*  CREATININE 3.81* 4.31* 5.66* 4.75* 4.16* 3.53*  --     Estimated Creatinine Clearance: 32.4 mL/min (A) (by C-G formula based on SCr of 3.53 mg/dL (H)).   Medical History: Past Medical History:  Diagnosis Date  . AICD (automatic cardioverter/defibrillator) present   . Anemia of chronic disease   . Arthritis   . Atrial flutter ([place])    DCCV 9/15  . Chest pain on exertion   . Chronic systolic congestive heart failure, NYHA class 2 (HCC)    EF 20-25% 1/16  . CKD (chronic kidney disease) stage 3, GFR 30-59 ml/min (HCC)   . Dyslipidemia   . Gout    Of big toe  . Hyperglycemia   . Hyperlipidemia   . Hypertension   . Morbid obesity with BMI of 45.0-49.9, adult ([place])   . Non-ischemic cardiomyopathy ([place])   . Organic erectile dysfunction   . OSA (obstructive sleep apnea)   . Pilonidal cyst   . Submandibular sialolithiasis    Right    Medications:  Scheduled:  . allopurinol  100 mg Per Tube BID  . amiodarone  200 mg Per Tube Daily  . chlorhexidine gluconate (MEDLINE KIT)  15 mL Mouth Rinse BID  . insulin aspart  1-3 Units Subcutaneous Q4H  . mouth rinse  15 mL Mouth Rinse Q2H  . sodium chloride flush  3 mL Intravenous Q12H  . sodium polystyrene  30 g Oral Once    Assessment: 48 yom who underwent cardiac arrest last night - now intubated and started on CVVHD. Was on apixaban 5 mg twice daily for atrial fibrillation PTA - was on hold yesterday for tunneled catheter placement.   Pharmacy consulted to dose heparin for atrial fibrillation - remains in atrial fibrillation today. Was receiving heparin for CRRT -  changed to systemic heparin. Hgb 11.6, plts 156 on last check. No signs/symptoms of bleeding.   Heparin drip 1450 uts/hr HL 0.76 - slightly above goal -  drawn correctly from art line  Goal of Therapy:  Heparin level 0.3-0.7 units/ml Monitor platelets by anticoagulation protocol: Yes   Plan:  Decrease heparin drip 1300 uts/hr  Daily HL, CBC  Bonnita Nasuti Pharm.D. CPP, BCPS Clinical Pharmacist [phone] 11/13/2017 8:15 PM

## 2017-11-13 NOTE — Progress Notes (Signed)
Dr. Juel Burrow called back and will write new Dialysis orders within 20 min of this time.  New labs to be drawn 4-6 hours post new Dialysis bag to be hung.  Oncoming RN made aware of new changes to be made with Dialysis bags.

## 2017-11-13 NOTE — Progress Notes (Signed)
Patient ID: Eric Murillo, male   DOB: [birth year], 57 y.o.   MRN: [id number]     Advanced Heart Failure Rounding Note  PCP-Cardiologist: Loralie Champagne, MD   Subjective:    Had cardiac arrest last night in setting of hyperK. Now intubated on FiO2 50%. Following commands on CVVHD. On NE 8.   Remains in AF. HR in 50s.    Objective:   Weight Range: (!) 140 kg (308 lb 10.3 oz) Body mass index is 44.93 kg/m.   Vital Signs:   Temp:  [95.9 F (35.5 C)-98 F (36.7 C)] 97.4 F (36.3 C) (04/23 0800) Pulse Rate:  [61-95] 61 (04/23 0315) Resp:  [10-30] 24 (04/23 0800) BP: (97-151)/(58-95) 114/71 (04/23 0800) SpO2:  [89 %-100 %] 100 % (04/23 0800) FiO2 (%):  [50 %-100 %] 50 % (04/23 0803) Weight:  [140 kg (308 lb 10.3 oz)] 140 kg (308 lb 10.3 oz) (04/23 0600) Last BM Date: 11/03/17  Weight change: Filed Weights   11/11/17 1254 11/12/17 0556 11/13/17 0600  Weight: (!) 140 kg (308 lb 10.3 oz) (!) 138.9 kg (306 lb 4.8 oz) (!) 140 kg (308 lb 10.3 oz)    Intake/Output:   Intake/Output Summary (Last 24 hours) at 11/13/2017 0827 Last data filed at 11/13/2017 0800 Gross per 24 hour  Intake 959.36 ml  Output 420 ml  Net 539.36 ml      Physical Exam   CVP 20 General:  Intubated. Sedated. HEENT: normal +ETT Neck: supple. JVP hard to see. Carotids 2+ bilat; no bruits. No lymphadenopathy or thryomegaly appreciated. Cor: PMI laterally displaced. IRR  Tunneled cath  Lungs: clear decreased at bases Abdomen: obese soft, nontender, nondistended. No hepatosplenomegaly. No bruits or masses. Good bowel sounds. Extremities: no cyanosis, clubbing, rash, 2+ edema LFV central line  RAC AVF Neuro: intubated sedated    Telemetry   AF 50-60s  Personally reviewed   Labs    CBC Recent Labs    11/12/17 1104 11/12/17 1929  WBC 11.5* 11.9*  HGB 11.7* 11.6*  HCT 39.7 40.2  MCV 89.2 93.3  PLT 176 409   Basic Metabolic Panel Recent Labs    11/11/17 0753  11/12/17 1929 11/13/17 0055  11/13/17 0400  NA 140   < > 136 136 136  K 4.9   < > 6.1* 5.2* 4.6  CL 100*   < > 98* 100* 101  CO2 28   < > _0 GLUCOSE 100*   < > 213* 158* 132*  BUN 60*   < > 57* 58* 55*  CREATININE 3.81*   < > 5.66* 4.75* 4.16*  CALCIUM 8.8*   < > 9.3 9.1 8.9  MG  --   --  2.6*  --  2.4  PHOS 3.3  --   --   --  3.4  3.5   < > = values in this interval not displayed.   Liver Function Tests Recent Labs    11/12/17 1929 11/13/17 0400  AST 77*  --   ALT 50  --   ALKPHOS 182*  --   BILITOT 2.1*  --   PROT 7.0  --   ALBUMIN 3.3* 3.2*   No results for input(s): LIPASE, AMYLASE in the last 72 hours. Cardiac Enzymes No results for input(s): CKTOTAL, CKMB, CKMBINDEX, TROPONINI in the last 72 hours.  BNP: BNP (last 3 results) Recent Labs    07/26/17 0916 11/03/17 1238 11/13/17 0400  BNP 399.7* 1,160.1* 1,085.3*  ProBNP (last 3 results) No results for input(s): PROBNP in the last 8760 hours.   D-Dimer No results for input(s): DDIMER in the last 72 hours. Hemoglobin A1C No results for input(s): HGBA1C in the last 72 hours. Fasting Lipid Panel Recent Labs    11/13/17 0055  TRIG 86   Thyroid Function Tests No results for input(s): TSH, T4TOTAL, T3FREE, THYROIDAB in the last 72 hours.  Invalid input(s): FREET3  Other results:   Imaging    Portable Chest Xray  Result Date: 11/13/2017 CLINICAL DATA:  Hypoxia EXAM: PORTABLE CHEST 1 VIEW COMPARISON:  November 12, 2017 FINDINGS: Endotracheal tube tip is 4.4 cm above the carina. Central catheter tip is in the right atrium just beyond the cavoatrial junction, stable. Nasogastric tube tip and side port are below the diaphragm. Pacemaker lead attached to right ventricle. No pneumothorax. There is a small right pleural effusion. There is bibasilar atelectasis. There is mild cardiomegaly with pulmonary vascularity within normal limits. No adenopathy. No bone lesions. IMPRESSION: And catheter positions as described without  pneumothorax. Pacemaker lead attached to right ventricle. Right pleural effusion with bibasilar atelectasis. Stable cardiac prominence. Electronically Signed   By: Lowella Grip III M.D.   On: 11/13/2017 08:14   Dg Chest Port 1 View  Result Date: 11/12/2017 CLINICAL DATA:  OG and ETT placement EXAM: PORTABLE CHEST 1 VIEW COMPARISON:  11/12/2017, 11/03/2017 FINDINGS: Endotracheal tube tip is about 2.9 cm superior to the carina. Esophageal tube tip is in the left upper quadrant. Right-sided central venous catheter tip overlies the right atrium. Low lung volumes. Cardiomegaly with vascular congestion. Foci of airspace disease in the bilateral lung bases and left upper lobe. No pneumothorax. Left-sided pacing device similar in position. IMPRESSION: 1. Endotracheal tube tip about 2.9 cm superior to carina. Esophageal tube tip is in the left upper quadrant of the abdomen 2. Cardiomegaly with vascular congestion. 3. Hypoventilatory changes. Developing foci of airspace disease in the bilateral lung bases and left upper lobe, atelectasis versus pneumonia. Electronically Signed   By: Donavan Foil M.D.   On: 11/12/2017 20:46   Dg Chest Port 1 View  Result Date: 11/12/2017 CLINICAL DATA:  Status post dialysis catheter insertion. EXAM: PORTABLE CHEST 1 VIEW COMPARISON:  Chest x-ray of November 03, 2017 FINDINGS: The dual-lumen dialysis catheter tip projects over the distal third of the SVC. There is no postprocedure pneumothorax or hemothorax. Both lungs are mildly hypoinflated. The cardiac silhouette is enlarged and the central pulmonary vascularity is engorged. The interstitial edema has improved. The ICD is in stable position. There is calcification in the wall of the aortic arch. IMPRESSION: No postprocedure complication following dialysis catheter placement. Improved pulmonary edema. Thoracic aortic atherosclerosis. Electronically Signed   By: David  Martinique M.D.   On: 11/12/2017 12:51   Dg Fluoro Guide Cv  Line-no Report  Result Date: 11/12/2017 Fluoroscopy was utilized by the requesting physician.  No radiographic interpretation.     Medications:     Scheduled Medications: . allopurinol  100 mg Per Tube BID  . amiodarone  200 mg Per Tube Daily  . chlorhexidine gluconate (MEDLINE KIT)  15 mL Mouth Rinse BID  . insulin aspart  1-3 Units Subcutaneous Q4H  . mouth rinse  15 mL Mouth Rinse QID  . sodium chloride flush  3 mL Intravenous Q12H  . sodium polystyrene  30 g Oral Once    Infusions: . sodium chloride 10 mL/hr at 11/12/17 2200  . sodium chloride    . sodium  chloride    . calcium gluconate    . fentaNYL infusion INTRAVENOUS 100 mcg/hr (11/12/17 2134)  . ferumoxytol    . heparin 10,000 units/ 20 mL infusion syringe 1,350 Units/hr (11/13/17 0737)  . norepinephrine (LEVOPHED) Adult infusion 8 mcg/min (11/13/17 0805)  . dialysis replacement fluid (prismasate) 300 mL/hr at 11/13/17 2952  . dialysis replacement fluid (prismasate) 300 mL/hr at 11/13/17 8413  . dialysate (PRISMASATE) 1,500 mL/hr at 11/13/17 2440  . propofol (DIPRIVAN) infusion Stopped (11/13/17 0557)  . sodium chloride      PRN Medications: sodium chloride, sodium chloride, sodium chloride, acetaminophen, alteplase, bisacodyl, docusate, fentaNYL, heparin, heparin, heparin, lidocaine (PF), lidocaine (PF), lidocaine-prilocaine, pentafluoroprop-tetrafluoroeth, sodium chloride, sodium chloride flush  Assessment/Plan   1. Cardiac arrest - Had arrest on 4/22 due to hyperkalemia with subsequent VF - Improved with CVVHD 2. Acute on chronic systolic CHF: Nonischemic cardiomyopathy x years. Last echo in 11/18 with moderate LV dilation, EF 20-25%, mildly decreased RV systolic function.  He had a workup for cardiac amyloidosis in the past.  However, negative SPEP and abdominal fat pad biopsy negative. Low voltage on ECG may be due to obesity and not amyloidosis. CKD stage IV makes management of volume difficult.  - Markedly  volume overloaded with CVP 20+ - Will continue CVVHD try to get up to pulling 200/hr - Use NE for support - Off b-blocker ith arrest - No ACEI/ARB/ARNI/spironolactone with ESRD 3. Atypical atrial flutter: Recurrent. Most recent DCCV in 11/18.  Saw Dr Curt Bears, not thought to be a good ablation candidate.  - Back in AF post code - Will hold amio for now with bradycardia - Apixiban has been on hold for HD cath. Will continue heparin for now. With ESRD will discuss with PharmD re switching apixaban to warfarin. I actually favor apixaban  4. AKI on CKD stage IV: BUN/creatinine have been rising. Vein mapping done and VVS consulted.  Nephrology appreciated.  - Tunneled HD cath and AVF placed 4/22. Now on CVVHD after arrest.  - Has received HD x 4 days, none today.  4. OSA: Has been unable to tolerate CPAP. No change.  5. Morbid obesity-Body mass index is 44.93 kg/m. 6. Hyperkalemia - K 6.2. Improved with CVVHD 7. Acute hypoxemic respiratory failure - Remains intubated. Hopefully can extubate soon. Appreciate CCM followup - Continue volume removal to facilitate extubation   CRITICAL CARE Performed by: Glori Bickers  Total critical care time: 35 minutes  Critical care time was exclusive of separately billable procedures and treating other patients.  Critical care was necessary to treat or prevent imminent or life-threatening deterioration.  Critical care was time spent personally by me (independent of midlevel providers or residents) on the following activities: development of treatment plan with patient and/or surrogate as well as nursing, discussions with consultants, evaluation of patient's response to treatment, examination of patient, obtaining history from patient or surrogate, ordering and performing treatments and interventions, ordering and review of laboratory studies, ordering and review of radiographic studies, pulse oximetry and re-evaluation of patient's  condition.    Length of Stay: 9  Glori Bickers, MD  11/13/2017, 8:27 AM  Advanced Heart Failure Team Pager [phone] (M-F; 7a - 4p)  Please contact [facility] for night-coverage after hours (4p -7a ) and weekends on [email or web address]

## 2017-11-13 NOTE — Progress Notes (Signed)
Nephro paged again regarding orders for new CRRT bags.  Orders placed and pharmacy notified.

## 2017-11-13 NOTE — Progress Notes (Signed)
  Echocardiogram 2D Echocardiogram with definity has been performed.  Eric Murillo 11/13/2017, 12:39 PM

## 2017-11-13 NOTE — Progress Notes (Signed)
ANTICOAGULATION CONSULT NOTE - Initial Consult  Pharmacy Consult for heparin Indication: atrial fibrillation  Allergies  Allergen Reactions  . Nsaids Other (See Comments)    Cannot take to due kidney issues  . Beta Adrenergic Blockers Other (See Comments)    "An unnamed, white-colored Beta Blocker made" him "feel funny" = made him feel cagey    Patient Measurements: Height: 5' 9.5" (176.5 cm) Weight: (!) 308 lb 10.3 oz (140 kg) IBW/kg (Calculated) : 71.85 Heparin Dosing Weight: 104.9 kg  Vital Signs: Temp: 97.4 F (36.3 C) (04/23 0800) Temp Source: Oral (04/23 0800) BP: 126/79 (04/23 0900) Pulse Rate: 58 (04/23 0803)  Labs: Recent Labs    11/11/17 0753 11/12/17 1104 11/12/17 1929 11/13/17 0055 11/13/17 0400  HGB 10.6* 11.7* 11.6*  --   --   HCT 36.7* 39.7 40.2  --   --   PLT 213 176 156  --   --   APTT  --   --   --   --  62*  LABPROT  --  18.2* 19.9*  --   --   INR  --  1.53 1.70  --   --   CREATININE 3.81* 4.31* 5.66* 4.75* 4.16*    Estimated Creatinine Clearance: 27.5 mL/min (A) (by C-G formula based on SCr of 4.16 mg/dL (H)).   Medical History: Past Medical History:  Diagnosis Date  . AICD (automatic cardioverter/defibrillator) present   . Anemia of chronic disease   . Arthritis   . Atrial flutter ([place])    DCCV 9/15  . Chest pain on exertion   . Chronic systolic congestive heart failure, NYHA class 2 (HCC)    EF 20-25% 1/16  . CKD (chronic kidney disease) stage 3, GFR 30-59 ml/min (HCC)   . Dyslipidemia   . Gout    Of big toe  . Hyperglycemia   . Hyperlipidemia   . Hypertension   . Morbid obesity with BMI of 45.0-49.9, adult ([place])   . Non-ischemic cardiomyopathy ([place])   . Organic erectile dysfunction   . OSA (obstructive sleep apnea)   . Pilonidal cyst   . Submandibular sialolithiasis    Right    Medications:  Scheduled:  . allopurinol  100 mg Per Tube BID  . amiodarone  200 mg Per Tube Daily  . chlorhexidine gluconate (MEDLINE KIT)  15  mL Mouth Rinse BID  . insulin aspart  1-3 Units Subcutaneous Q4H  . mouth rinse  15 mL Mouth Rinse Q2H  . sodium chloride flush  3 mL Intravenous Q12H  . sodium polystyrene  30 g Oral Once    Assessment: 87 yom who underwent cardiac arrest last night - now intubated and started on CVVHD. Was on apixaban 5 mg twice daily for atrial fibrillation PTA - was on hold yesterday for tunneled catheter placement.   Pharmacy consulted to dose heparin for atrial fibrillation - remains in atrial fibrillation today. Was receiving heparin for CRRT - will change to systemic heparin. Hgb 11.6, plts 156 on last check. No signs/symptoms of bleeding.   Goal of Therapy:  Heparin level 0.3-0.7 units/ml Monitor platelets by anticoagulation protocol: Yes   Plan:  Start heparin infusion at 1450 units/hr Check anti-Xa level in 8 hours and daily while on heparin Continue to monitor H&H and platelets  Doylene Canard, PharmD Clinical Pharmacist  Pager: [phone] Clinical Phone for 11/13/2017 until 3:30pm: [phone] If after 3:30pm, please call main pharmacy at [phone] 11/13/2017,9:48 AM

## 2017-11-13 NOTE — Telephone Encounter (Signed)
-----   Message from Sharee Pimple, RN sent at 11/12/2017  3:50 PM EDT ----- Regarding: 4-6 weeks    ----- Message ----- From: Dara Lords, PA-C Sent: 11/12/2017  10:13 AM To: Vvs Charge Pool  S/p right BC AVF/tdc placement 11/12/17.  F/u with Dr. Darrick Penna in 4-6 weeks with duplex.  Thanks [employer]

## 2017-11-13 NOTE — Telephone Encounter (Signed)
Unable to speak to pt, mobile just rings and home # vm is full  Mld lttr for [place] and OV on 6/6

## 2017-11-13 NOTE — Care Management Note (Signed)
Case Management Note Previous CM note completed by Lawerance Sabal, RN 11/07/2017, 8:57 AM  Patient Details  Name: Tallis Kemna MRN: [id number] Date of Birth: 1961-07-08  Subjective/Objective:                 Patient from home, lives w mom. States he weighs himself at home every few days. Patient states he does not have DME at home. B legs wrapped, states he has had gout "real bad." Will order PT consult as I anticipate he is not very mobile and may benefit from home PT (although it would be limited) and an evaluation for DME needs.  Cardiologist: Marisa Hua PCP Terrilee Croak, [facility]   Action/Plan:   Additional CM follow up notes:  11/13/17- 1200- Ruhani Umland RN, CM- pt tx to [facility] from 3E s/p cardiac arrest on 4/22 due to hyperkalemia with subsequent VF - Improved with CVVHD- pt remains on vent and CVVHD today- CM to follow for transition of care needs- noted pt had AV fistula and tunneled cath on 4/22- with plan to clip for outpt HD   Expected Discharge Date:                  Expected Discharge Plan:  Home/Self Care  In-House Referral:     Discharge planning Services  CM Consult, Other - See comment(PCP provider. out patient PT)  Post Acute Care Choice:    Choice offered to:     DME Arranged:    DME Agency:     HH Arranged:    HH Agency:     Status of Service:  In process, will continue to follow  If discussed at Long Length of Stay Meetings, dates discussed:    Discharge Disposition:   Additional Comments:  Darrold Span, RN 11/13/2017, 12:11 PM [phone] 4E Transition Care Coordinator/ 2H coverage

## 2017-11-13 NOTE — Progress Notes (Signed)
Initial Nutrition Assessment  DOCUMENTATION CODES:   Morbid obesity  INTERVENTION:  If unable to extubate within 24-48 hours, recommend Vital HP @ goal rate of 65 ml/hr (1560 ml/24hrs) + pro-stat TID providing 1860 kcal, 181.5 grams protein, and 1310 ml H2O meeting 100% of estimated needs.   NUTRITION DIAGNOSIS:   Inadequate oral intake related to inability to eat(NPO status) as evidenced by NPO status.  GOAL:   Provide needs based on ASPEN/SCCM guidelines  MONITOR:   Vent status, Weight trends, Skin, I & O's  REASON FOR ASSESSMENT:   Ventilator    ASSESSMENT:   57 y.o. M with newly diagnosed ESRD admitted 11/03/17 for SOB. Pt had surgery to place dialysis catheter and fistula 11/12/17. Pt then went into cardiac arrest 11/12/17. Pt intubated with sedation and went on CRRT after intubation 11/12/17. PMH of HTN, HLD, CHF, morbid obesity, gout, CKD stg 3 - now ESRD, OSA, submandibular sialolithiasis, anemia of chronic disease, hyperglycemia, arthritis, and AICD.   Per MD note pt reports his weight was 319 lbs 1 month ago and he was happy with that, pt has not weighed himself since. Chart shows prior to intubation pt was eating 100% of his meals. No family at bedside.   MAP: 73  MVe: 13.4  Temp (24hrs), Avg:97.5 F (36.4 C), Min:95.9 F (35.5 C), Max:98.4 F (36.9 C)  Medications reviewed: novolog 1-3 units, fentanyl, 0.9% sodium chloride infusion, heparin, norepinephrine, CRRT.   Labs reviewed: K+ 4.6 (WNL), BG 132 (H), BUN 55 (H), creatinine 4.16 (H), GFR 15 (L), phosphorus 3.4 (WNL, magnesium 2.4 (WNL). 4/22: PTH 278 (H).    Intake/Output Summary (Last 24 hours) at 11/13/2017 1442 Last data filed at 11/13/2017 1400 Gross per 24 hour  Intake 714.85 ml  Output 1205 ml  Net -490.15 ml   Lab Results  Component Value Date   HGBA1C 5.4 07/10/2013   NUTRITION - FOCUSED PHYSICAL EXAM:    Most Recent Value  Orbital Region  No depletion  Upper Arm Region  No depletion   Thoracic and Lumbar Region  No depletion  Buccal Region  No depletion  Temple Region  No depletion  Clavicle Bone Region  No depletion  Clavicle and Acromion Bone Region  No depletion  Scapular Bone Region  No depletion  Dorsal Hand  No depletion  Patellar Region  No depletion  Anterior Thigh Region  No depletion  Posterior Calf Region  No depletion  Edema (RD Assessment)  Moderate  Hair  Reviewed  Eyes  Reviewed  Skin  Reviewed  Nails  Reviewed       Diet Order:  Diet NPO time specified  EDUCATION NEEDS:   Not appropriate for education at this time  Skin:  Skin Assessment: Skin Integrity Issues: Skin Integrity Issues:: Incisions Incisions: R chest, R arm.   Last BM:  11/03/17  Height:   Ht Readings from Last 1 Encounters:  11/13/17 5' 9.5" (1.765 m)    Weight:   Wt Readings from Last 1 Encounters:  11/13/17 (!) 308 lb 10.3 oz (140 kg)   UBW: 320 lbs %UBW: 96%  Pt net I/O - 14 L since admission. Weight upon admission 150.8 kg, now 140 kg.  Ideal Body Weight:  74.09 kg  BMI:  Body mass index is 44.93 kg/m.  Estimated Nutritional Needs:   Kcal:  1650-1850 kcal  Protein:  175-185 grams  Fluid:  >/= 1.6 L or per MD   Sherrine Maples, Dietetic Intern

## 2017-11-13 NOTE — Progress Notes (Signed)
PT Cancellation Note  Patient Details Name: Eric Murillo MRN: [id number] DOB: 1961-04-03   Cancelled Treatment:    Reason Eval/Treat Not Completed: Patient not medically ready. Pt requiring CPR and intubated overnight, now on CRRT. Will follow-up for PT treatment this afternoon or tomorrow when medically appropriate, as schedule permits.  Ina Homes, PT, DPT Acute Rehab Services  Pager: [phone]  Malachy Chamber 11/13/2017, 8:26 AM

## 2017-11-13 NOTE — Progress Notes (Signed)
Bladder scan performed on patient and noted to have 675cc/urine.  I&O Cath performed by Devota Pace and Edilia Bo following peri care.  700cc cloudy amber urine received with strong odor noted.

## 2017-11-13 NOTE — Progress Notes (Signed)
Renal doctor on call paged concerning Potassium level of 5.3.

## 2017-11-13 NOTE — Progress Notes (Signed)
Pulmonary/Critical Care Progress Note  Patient Name: Eric Murillo MRN: [id number] DOB: [birth year]    ADMISSION DATE:  11/03/2017 CONSULTATION DATE:  11/12/2017  REFERRING MD:  Dr. Marigene Ehlers  REASON FOR CONSULTATION: Cardiac arrest   ASSESSMENT/PLAN:  ASSESSMENT (included in the Hospital Problem List)   PULMONARY  Acute hypoxemic respiratory failure  Obstructive sleep apnea, untreated Continue mechanical ventilatory support.   Spontaneous breathing trial in a.m.  VAP bundle Chest x-ray, ABG in a.m.  CARDIOVASCULAR  Acute on chronic systolic heart failure  Atrial fibrillation, rate controlled  Wide-complex tachyarrhythmia/cardiac arrest/asystole/ventricular fibrillation Management per advanced heart failure service. Norepinephrine to be titrated per cardiology.  RENAL  End-stage renal disease  Hyperkalemia  Metabolic acidosis CRRT per Nephrology  GASTROINTESTINAL  NUTRITION: N.p.o. for now in anticipation of liberation from mechanical ventilatory support in a.m.  HEMATOLOGIC  Anemia Eliquis to be restarted at the discretion of the primary team  ENDOCRINE  Hyperglycemia Monitor fingerstick glucose. Sliding scale insulin.  NEUROLOGIC  Acute metabolic encephalopathy, improved Titrate sedation (propofol infusion) for RASS -1     SUBJECTIVE:   -Interim events: Remains intubated.  On fentanyl infusion for sedation.  Easily arousable.  RASS -1.  On norepinephrine for blood pressure support.  Has consistently tolerated -125 mL/h while on CRRT.  HISTORY OF PRESENT ILLNESS This 57 year old African-American male presented with flulike symptoms 4/13 and was found to be in atrial fibrillation with profound volume overload.  He has CKD stage IV at baseline but presented with acute worsening.  Attempts at diuretic therapy failed.  HD catheter was placed for initiation of HD on 419.  He underwent right upper extremity fistula placement on 4/22.   Postoperatively, he developed worsening agitation and hypoxia.  He subsequently developed a wide-complex tachycardia, prompting approximately 37mnute defibrillation.  He was found to be hyperkalemic.  He was intubated and [facility] to [facility].   PAST MEDICAL/SURGICAL HISTORIES  Past Medical History:  Diagnosis Date  . AICD (automatic cardioverter/defibrillator) present   . Anemia of chronic disease   . Arthritis   . Atrial flutter ([facility]    DCCV 9/15  . Chest pain on exertion   . Chronic systolic congestive heart failure, NYHA class 2 (HCC)    EF 20-25% 1/16  . CKD (chronic kidney disease) stage 3, GFR 30-59 ml/min (HCC)   . Dyslipidemia   . Gout    Of big toe  . Hyperglycemia   . Hyperlipidemia   . Hypertension   . Morbid obesity with BMI of 45.0-49.9, adult ([facility]   . Non-ischemic cardiomyopathy ([facility]   . Organic erectile dysfunction   . OSA (obstructive sleep apnea)   . Pilonidal cyst   . Submandibular sialolithiasis    Right    Past Surgical History:  Procedure Laterality Date  . AV FISTULA PLACEMENT Right 11/12/2017   Procedure: CREATION of RIGHT BRACHIAL CEPHALIC ARTERIOVENOUS (AV) FISTULA;  Surgeon: FElam Dutch MD;  [place]  Service: Vascular;  Laterality: Right;  . CARDIAC CATHETERIZATION  2012  . CARDIOVERSION N/A 04/03/2014   Procedure: CARDIOVERSION;  Surgeon: DLarey Dresser MD;  [facility]  Service: Cardiovascular;  Laterality: N/A;  . CARDIOVERSION N/A 03/09/2016   Procedure: CARDIOVERSION;  Surgeon: DLarey Dresser MD;  [facility];  Service: Cardiovascular;  Laterality: N/A;  . CARDIOVERSION N/A 06/06/2017   Procedure: CARDIOVERSION;  Surgeon: MLarey Dresser MD;  [place] [facility] [place]  Service: Cardiovascular;  Laterality: N/A;  .  COLONOSCOPY    . EP IMPLANTABLE DEVICE N/A 05/21/2015   Procedure: ICD Implant;  Surgeon: Deboraha Sprang, MD;  Location: [place] CV LAB;   Service: Cardiovascular;  Laterality: N/A;  . INSERTION OF DIALYSIS CATHETER Right 11/12/2017   Procedure: PLACEMENT of right internal jugualr tunneled DIALYSIS CATHETER with removal or temporary dialysis catheter;  Surgeon: Elam Dutch, MD;  Location: [place];  Service: Vascular;  Laterality: Right;  . IR FLUORO GUIDE CV LINE RIGHT  11/08/2017  . IR US GUIDE VASC ACCESS RIGHT  11/08/2017  . LEFT AND RIGHT HEART CATHETERIZATION WITH CORONARY ANGIOGRAM N/A 04/24/2014   Procedure: LEFT AND RIGHT HEART CATHETERIZATION WITH CORONARY ANGIOGRAM;  Surgeon: Larey Dresser, MD;  Location: [facility] CATH LAB;  Service: Cardiovascular;  Laterality: N/A;  . SALIVARY GLAND SURGERY  09/12/2012  . SUBMANDIBULAR GLAND EXCISION Right 09/12/2012   Procedure: Removal Right Submandibular Joaquim Lai;  Surgeon: Izora Gala, MD;  Location: [place];  Service: ENT;  Laterality: Right;  . TEE WITHOUT CARDIOVERSION N/A 04/03/2014   Procedure: TRANSESOPHAGEAL ECHOCARDIOGRAM (TEE);  Surgeon: Larey Dresser, MD;  Location: [place];  Service: Cardiovascular;  Laterality: N/A;  . TEE WITHOUT CARDIOVERSION N/A 03/09/2016   Procedure: TRANSESOPHAGEAL ECHOCARDIOGRAM (TEE);  Surgeon: Larey Dresser, MD;  Location[facility] ENDOSCOPY;  Service: Cardiovascular;  Laterality: N/A;  . TEE WITHOUT CARDIOVERSION N/A 06/06/2017   Procedure: TRANSESOPHAGEAL ECHOCARDIOGRAM (TEE);  Surgeon: Larey Dresser, MD;  Location: [facility] ENDOSCOPY;  Service: Cardiovascular;  Laterality: N/A;     Prior to Admission medications   Medication Sig Start Date End Date Taking? Authorizing Provider  acetaminophen (TYLENOL) 500 MG tablet Take 2 tablets (1,000 mg total) by mouth every 8 (eight) hours as needed. Patient taking differently: Take 1,000 mg every 8 (eight) hours as needed by mouth (for pain or headaches).  02/07/17 02/07/18 Yes Zada Finders, MD  allopurinol (ZYLOPRIM) 100 MG tablet TAKE 1 TABLET BY MOUTH TWO TIMES DAILY 09/19/17  Yes Zada Finders, MD  amiodarone  (PACERONE) 200 MG tablet Take 1 tablet (200 mg total) by mouth daily. TAKE 2 TABLETS DAILY UNTIL 06/14/2017, and then resume 1 daily Patient taking differently: Take 200 mg by mouth daily.  06/27/17  Yes Zada Finders, MD  carvedilol (COREG) 6.25 MG tablet Take 1 tablet (6.25 mg total) 2 (two) times daily with a meal by mouth. 06/11/17  Yes Tawny Asal, MD  colchicine 0.6 MG tablet Take 0.6 mg 2 (two) times daily as needed by mouth (FOR GOUT FLARES). Reported on 01/18/2016   Yes [provider]  diclofenac sodium (VOLTAREN) 1 % GEL Apply 4 g topically 4 (four) times daily. 02/07/17  Yes Zada Finders, MD  ELIQUIS 5 MG TABS tablet TAKE ONE TABLET BY MOUTH TWICE DAILY 08/23/17  Yes Zada Finders, MD  isosorbide-hydrALAZINE (BIDIL) 20-37.5 MG tablet Take 1 tablet 3 (three) times daily by mouth. 06/11/17  Yes Tawny Asal, MD  metolazone (ZAROXOLYN) 2.5 MG tablet Take 1 tablet (2.5 mg total) by mouth once a week. Every Wednesday, take extra as directed 08/03/17  Yes Tillery, Satira Mccallum, PA-C  potassium chloride SA (KLOR-CON M20) 20 MEQ tablet Take 2 tablets (40 mEq total) by mouth 3 (three) times daily. 07/26/17  Yes Clegg, Amy D, NP  torsemide (DEMADEX) 20 MG tablet Take 5 tablets (100 mg total) by mouth 2 (two) times daily. 07/26/17  Yes Clegg, Amy D, NP    Allergies  Allergen Reactions  . Nsaids Other (See Comments)  Cannot take to due kidney issues  . Beta Adrenergic Blockers Other (See Comments)    "An unnamed, white-colored Beta Blocker made" him "feel funny" = made him feel cagey    Current Facility-Administered Medications:  .  0.9 %  sodium chloride infusion, 250 mL, Intravenous, PRN, Rhyne, Samantha J, PA-C, Last Rate: 10 mL/hr at 11/13/17 1700 .  0.9 %  sodium chloride infusion, 100 mL, Intravenous, PRN, Rhyne, Samantha J, PA-C .  0.9 %  sodium chloride infusion, 100 mL, Intravenous, PRN, Rhyne, Samantha J, PA-C .  acetaminophen (TYLENOL) solution 650 mg, 650 mg, Per Tube, Q4H  PRN, Larey Dresser, MD .  allopurinol (ZYLOPRIM) tablet 100 mg, 100 mg, Per Tube, BID, Larey Dresser, MD, 100 mg at 11/13/17 1057 .  alteplase (CATHFLO ACTIVASE) injection 2 mg, 2 mg, Intracatheter, Once PRN, Rhyne, Samantha J, PA-C .  amiodarone (PACERONE) tablet 200 mg, 200 mg, Per Tube, Daily, Larey Dresser, MD .  bisacodyl (DULCOLAX) suppository 10 mg, 10 mg, Rectal, Daily PRN, Larey Dresser, MD .  calcium gluconate 1 g in sodium chloride 0.9 % 100 mL IVPB, 1 g, Intravenous, Once, Larey Dresser, MD .  chlorhexidine gluconate (MEDLINE KIT) (PERIDEX) 0.12 % solution 15 mL, 15 mL, Mouth Rinse, BID, Corey Harold, NP, 15 mL at 11/13/17 0803 .  docusate (COLACE) 50 MG/5ML liquid 100 mg, 100 mg, Per Tube, BID PRN, Larey Dresser, MD .  famotidine (PEPCID) IVPB 20 mg premix, 20 mg, Intravenous, Q24H, Shirley Friar, PA-C, Stopped at 11/13/17 1440 .  fentaNYL (SUBLIMAZE) bolus via infusion 25 mcg, 25 mcg, Intravenous, Q1H PRN, Larey Dresser, MD .  fentaNYL 25105mg in NS 2575m(1033mml) infusion-PREMIX, 25-400 mcg/hr, Intravenous, Continuous, McLLarey DresserD, Last Rate: 10 mL/hr at 11/13/17 1700, 100 mcg/hr at 11/13/17 1700 .  ferumoxytol (FERAHEME) 510 mg in sodium chloride 0.9 % 100 mL IVPB, 510 mg, Intravenous, Once, BhaRosita FireD .  heparin ADULT infusion 100 units/mL (25000 units/250m38mdium chloride 0.45%), 1,450 Units/hr, Intravenous, Continuous, Bensimhon, DaniShaune Pascal, Last Rate: 14.5 mL/hr at 11/13/17 1700, 1,450 Units/hr at 11/13/17 1700 .  heparin injection 1,000 Units, 1,000 Units, Dialysis, PRN, Rhyne, Samantha J, PA-C .  insulin aspart (novoLOG) injection 1-3 Units, 1-3 Units, Subcutaneous, Q4H, HoffCorey Harold, 1 Units at 11/13/17 0450 .  lidocaine (PF) (XYLOCAINE) 1 % injection 5 mL, 5 mL, Intradermal, PRN, Rhyne, Samantha J, PA-C .  lidocaine (PF) (XYLOCAINE) 1 % injection, , , PRN, McCuJacqulynn Cadet, 10 mL at 11/08/17 1813 .   lidocaine-prilocaine (EMLA) cream 1 application, 1 application, Topical, PRN, Rhyne, Samantha J, PA-C .  MEDLINE mouth rinse, 15 mL, Mouth Rinse, Q2H, McLeLarey Dresser, 15 mL at 11/13/17 1600 .  norepinephrine (LEVOPHED) 16 mg in dextrose 5 % 250 mL (0.064 mg/mL) infusion, 0-40 mcg/min, Intravenous, Titrated, Bensimhon, DaniShaune Pascal, Last Rate: 14.1 mL/hr at 11/13/17 1700, 15.04 mcg/min at 11/13/17 1700 .  pentafluoroprop-tetrafluoroeth (GEBAUERS) aerosol 1 application, 1 application, Topical, PRN, Rhyne, Samantha J, PA-C .  prismasol BGK 4/2.5 5,000 mL dialysis replacement fluid, , CRRT, Continuous, GoldCorliss Parish, Last Rate: 300 mL/hr at 11/13/17 [id number] BGK 4/2.5 5,000 mL dialysis replacement fluid, , CRRT, Continuous, GoldCorliss Parish, Last Rate: 300 mL/hr at 11/13/17 [id number] BGK 4/2.5 5,000 mL dialysis solution, , CRRT, Continuous, GoldCorliss Parish, Last Rate: 1,500 mL/hr at 11/13/17 1702 .  propofol (DIPRIVAN) 1000 MG/100ML  infusion, 5-80 mcg/kg/min, Intravenous, Titrated, Thomas, Tijo, DO, Stopped at 11/13/17 0557 .  sodium chloride 0.9 % primer fluid for CRRT, , CRRT, PRN, Corliss Parish, MD .  sodium chloride flush (NS) 0.9 % injection 3 mL, 3 mL, Intravenous, Q12H, Rhyne, Samantha J, PA-C, 10 mL at 11/12/17 2100 .  sodium chloride flush (NS) 0.9 % injection 3 mL, 3 mL, Intravenous, PRN, Rhyne, Samantha J, PA-C, 3 mL at 11/03/17 2146 .  sodium polystyrene (KAYEXALATE) 15 GM/60ML suspension 30 g, 30 g, Oral, Once, Bensimhon, Shaune Pascal, MD   OBJECTIVE:   VITAL SIGNS: BP 121/90   Pulse 72   Temp 98.4 F (36.9 C) (Oral)   Resp (!) 9   Ht 5' 9.5" (1.765 m)   Wt (!) 140 kg (308 lb 10.3 oz)   SpO2 100%   BMI 44.93 kg/m  Vitals:   11/13/17 1630 11/13/17 1645 11/13/17 1700 11/13/17 1715  BP:   121/90   Pulse:      Resp: (!) 0 11 20 (!) 9  Temp:      TempSrc:      SpO2: 99% 99% 99% 100%  Weight:      Height:         HEMODYNAMICS: CVP:  [16 mmHg-25 mmHg] 18 mmHg  VENTILATOR SETTINGS: Vent Mode: PRVC FiO2 (%):  [50 %-100 %] 50 % Set Rate:  [22 bmp-24 bmp] 24 bmp Vt Set:  [570 mL] 570 mL PEEP:  [5 cmH20-8 cmH20] 8 cmH20 Plateau Pressure:  [19 cmH20-23 cmH20] 19 cmH20  INTAKE / OUTPUT: I/O last 3 completed shifts: In: 1179.4 [P.O.:220; I.V.:839.4; NG/GT:120] Out: 402 [Other:392; Blood:10]  PHYSICAL EXAMINATION: GENERAL: Intubated and sedated.  Easily arousable.  Interactive and follows commands. HEAD: normocephalic, atraumatic EYE: PERRLA, EOM intact, no scleral icterus, no pallor. THROAT/ORAL CAVITY: ETT in situ. Mallampati classification cannot be determined at this time . NECK: supple, no thyromegaly, no JVD, no lymphadenopathy. Trachea midline. CHEST/LUNG: symmetric in development and expansion. Good air entry. no crackles. No wheezes. HEART: Irregularly irregular rate and rhythm without murmur, rub or gallop. ABDOMEN: soft, nontender, nondistended. Normoactive bowel sounds. No rebound. No guarding. No hepatosplenomegaly. EXTREMITIES: Edema: 2+ and improving. No cyanosis. No clubbing. 2+ DP pulses LYMPHATIC: no cervical/axiallary/inguinal lymph nodes appreciated MUSCULOSKELETAL: no joint tenderness, deformity or swelling. SKIN:  No rash or lesion. NEUROLOGIC: Doll's eyes intact. Corneal reflex intact. Spontaneous respirations intact. Cranial nerves II-XII are grossly symmetric and physiologic. Babinski absent. No sensory deficit. Motor: 5/5 @ RUE, 5/5 @ LUE, 5/5 @ RLL,  5/5 @ LLL.  DTR: 2+ @ R biceps, 2+ @ L biceps, 2+ @ R patellar,  2+ @ L patellar. No cerebellar signs. Gait was not assessed.   LABS:  SERUM CHEMISTRY Recent Labs  Lab 11/11/17 0753  11/12/17 1929 11/13/17 0055 11/13/17 0400 11/13/17 1640  NA 140   < > 136 136 136 136  K 4.9   < > 6.1* 5.2* 4.6 5.3*  CL 100*   < > 98* 100* 101 100*  CO2 28   < > '22 22 24 23  '$ BUN 60*   < > 57* 58* 55* 47*  CREATININE 3.81*   <  > 5.66* 4.75* 4.16* 3.53*  GLUCOSE 100*   < > 213* 158* 132* 103*  CALCIUM 8.8*   < > 9.3 9.1 8.9 8.7*  MG  --   --  2.6*  --  2.4  --   PHOS 3.3  --   --   --  3.4  3.5 3.0   < > = values in this interval not displayed.    Glucose Recent Labs  Lab 11/12/17 2301 11/13/17 0431 11/13/17 0802 11/13/17 1240  GLUCAP 174* 144* 108* 101*    Liver Enzymes Recent Labs  Lab 11/12/17 1929 11/13/17 0400 11/13/17 1640  AST 77*  --   --   ALT 50  --   --   ALKPHOS 182*  --   --   BILITOT 2.1*  --   --   ALBUMIN 3.3* 3.2* 3.3*    CBC Recent Labs  Lab 11/11/17 0753 11/12/17 1104 11/12/17 1929  WBC 12.9* 11.5* 11.9*  HGB 10.6* 11.7* 11.6*  HCT 36.7* 39.7 40.2  PLT 213 176 156    COAGULATION STUDIES Recent Labs  Lab 11/09/17 0034 11/12/17 1104 11/12/17 1929 11/13/17 0400  APTT  --   --   --  62*  INR 1.85 1.53 1.70  --     Sepsis Markers Recent Labs  Lab 11/12/17 1929 11/12/17 1936 11/13/17 0055 11/13/17 0400  LATICACIDVEN  --  5.7* 1.9 1.8  PROCALCITON 1.30  --   --   --     ABG Recent Labs  Lab 11/12/17 1957 11/12/17 2104  PHART 7.138* 7.300*  PCO2ART 71.3* 50.6*  PO2ART 100.0 153.0*    Cardiac Enzymes No results for input(s): TROPONINI, PROBNP in the last 168 hours.  CULTURES: Results for orders placed or performed during the hospital encounter of 11/03/17  Surgical pcr screen     Status: None   Collection Time: 11/08/17  9:58 PM  Result Value Ref Range Status   MRSA, PCR NEGATIVE NEGATIVE Final   Staphylococcus aureus NEGATIVE NEGATIVE Final    Comment: (NOTE) The Xpert SA Assay (FDA approved for NASAL specimens in patients 72 years of age and older), is one component of a comprehensive surveillance program. It is not intended to diagnose infection nor to guide or monitor treatment. Performed at [facility], [place] [address] [place], [place] [address]     IMAGING: Portable Chest Xray  Result Date: 11/13/2017 CLINICAL DATA:   Hypoxia EXAM: PORTABLE CHEST 1 VIEW COMPARISON:  November 12, 2017 FINDINGS: Endotracheal tube tip is 4.4 cm above the carina. Central catheter tip is in the right atrium just beyond the cavoatrial junction, stable. Nasogastric tube tip and side port are below the diaphragm. Pacemaker lead attached to right ventricle. No pneumothorax. There is a small right pleural effusion. There is bibasilar atelectasis. There is mild cardiomegaly with pulmonary vascularity within normal limits. No adenopathy. No bone lesions. IMPRESSION: And catheter positions as described without pneumothorax. Pacemaker lead attached to right ventricle. Right pleural effusion with bibasilar atelectasis. Stable cardiac prominence. Electronically Signed   By: Lowella Grip III M.D.   On: 11/13/2017 08:14   Dg Chest Port 1 View  Result Date: 11/12/2017 CLINICAL DATA:  OG and ETT placement EXAM: PORTABLE CHEST 1 VIEW COMPARISON:  11/12/2017, 11/03/2017 FINDINGS: Endotracheal tube tip is about 2.9 cm superior to the carina. Esophageal tube tip is in the left upper quadrant. Right-sided central venous catheter tip overlies the right atrium. Low lung volumes. Cardiomegaly with vascular congestion. Foci of airspace disease in the bilateral lung bases and left upper lobe. No pneumothorax. Left-sided pacing device similar in position. IMPRESSION: 1. Endotracheal tube tip about 2.9 cm superior to carina. Esophageal tube tip is in the left upper quadrant of the abdomen 2. Cardiomegaly with vascular congestion. 3. Hypoventilatory changes. Developing foci  of airspace disease in the bilateral lung bases and left upper lobe, atelectasis versus pneumonia. Electronically Signed   By: Donavan Foil M.D.   On: 11/12/2017 20:46    OTHER STUDIES:  -  ANTIBIOTICS: Cefazolin (4/22, perioperatively)  SIGNIFICANT EVENTS: 4/22: Wide-complex tachyarrhythmia, prompting ACLS, intubation and transfer to ICU  LINES/TUBES: L fem CVL 4/22 > Perm cath RIJ 4/22  > Radial art 4/22 > ETT 4/22 >   My assessment, plan of care, findings, medications, side effects, etc. were discussed with:  Nurse  Family (questions answered) updated   Renee Pain, MD Board Certified by the ABIM, [place] Pager: [phone]  11/13/2017, 5:24 PM

## 2017-11-13 NOTE — Progress Notes (Signed)
[facility] NEPHROLOGY PROGRESS NOTE  Assessment/ Plan: Pt is a 57 y.o. yo male with chronic kidney disease stage IV, admitted with  congestive heart failure exacerbation and worsening renal failure.  Placement of right IJ tunnel catheter and AV fistula on 11/12/17 by VVS.  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  Placement of right upper extremity AV fistula and right IJ tunneled catheterby VVS on 4/22.  The plan was to this yesterday after placement of catheter however letter patient had cardiac arrest requiring to move to ICU for CRRT.  Currently blood pressure is 90s on Levophed low-dose.  He has elevated CVP.  Discussed with the nursing staff to increase the pressors so that we can do some ultrafiltration.  -4K, IV heparin, UF goal 50-100 if tolerated by BP, access RIJ  #Hyperkalemia: improved. Monitor BMP.  #Acute respiratory failure with hypoxia: Currently intubated.  #CHF exacerbation with volume overload: He has leg edema.  Try to ultrafiltrate with dialysis.  #Hypotension on Levophed.  Titrate as needed.  Monitor blood pressure.  #Anemia of CKD: Hemoglobin 11.7.  Iron saturation 17% with a serum iron of 78.  Ordered a dose of Feraheme 510 mg IV on 4/22.  #Secondary hyperparathyroidism: Follow-up PTH level.  Phosphorus 3.3.  Off binders  #Nonischemic cardiomyopathy status post ICD: EF of 25-30%.  #A. fib/flutter: On amiodarone  Subjective: Seen and examined in ICU.  Overnight event noted.  Currently intubated and sedated.  Family including his sister and nephew at bedside. Objective Vital signs in last 24 hours: Vitals:   11/13/17 0500 11/13/17 0600 11/13/17 0700 11/13/17 0800  BP: 137/81 118/67 110/78 114/71  Pulse:      Resp: (!) 24 (!) 24 (!) 21 (!) 24  Temp:    (!) 97.4 F (36.3 C)  TempSrc:    Oral  SpO2: 100% 100% 100% 100%  Weight:  (!) 140 kg (308 lb 10.3 oz)    Height:       Weight change:  -2 kg (-4 lb 6.6 oz)  Intake/Output Summary (Last 24 hours) at 11/13/2017 0820 Last data filed at 11/13/2017 0700 Gross per 24 hour  Intake 959.36 ml  Output 402 ml  Net 557.36 ml       Labs: Basic Metabolic Panel: Recent Labs  Lab 11/10/17 1930 11/11/17 0753  11/12/17 1929 11/13/17 0055 11/13/17 0400  NA 137 140   < > 136 136 136  K 3.7 4.9   < > 6.1* 5.2* 4.6  CL 95* 100*   < > 98* 100* 101  CO2 28 28   < > '22 22 24  '$ GLUCOSE 126* 100*   < > 213* 158* 132*  BUN 69* 60*   < > 57* 58* 55*  CREATININE 3.50* 3.81*   < > 5.66* 4.75* 4.16*  CALCIUM 8.5* 8.8*   < > 9.3 9.1 8.9  PHOS 3.3 3.3  --   --   --  3.4  3.5   < > = values in this interval not displayed.   Liver Function Tests: Recent Labs  Lab 11/11/17 0753 11/12/17 1929 11/13/17 0400  AST  --  77*  --   ALT  --  50  --   ALKPHOS  --  182*  --   BILITOT  --  2.1*  --   PROT  --  7.0  --   ALBUMIN 3.7 3.3* 3.2*   No results for input(s): LIPASE,  AMYLASE in the last 168 hours. No results for input(s): AMMONIA in the last 168 hours. CBC: Recent Labs  Lab 11/08/17 0407  11/09/17 0034 11/10/17 0558 11/11/17 0753 11/12/17 1104 11/12/17 1929  WBC 10.7*   < > 11.1* 13.6* 12.9* 11.5* 11.9*  NEUTROABS 7.9*  --  8.5* 10.1*  --   --   --   HGB 10.5*   < > 10.7* 10.4* 10.6* 11.7* 11.6*  HCT 35.3*   < > 36.6* 35.5* 36.7* 39.7 40.2  MCV 88.9   < > 88.6 89.0 88.9 89.2 93.3  PLT 247   < > 253 241 213 176 156   < > = values in this interval not displayed.   Cardiac Enzymes: No results for input(s): CKTOTAL, CKMB, CKMBINDEX, TROPONINI in the last 168 hours. CBG: Recent Labs  Lab 11/12/17 2301 11/13/17 0431 11/13/17 0802  GLUCAP 174* 144* 108*    Iron Studies:  Recent Labs    11/12/17 1104  IRON 78  TIBC 468*  FERRITIN 36   Studies/Results: Portable Chest Xray  Result Date: 11/13/2017 CLINICAL DATA:  Hypoxia EXAM: PORTABLE CHEST 1 VIEW COMPARISON:  November 12, 2017 FINDINGS: Endotracheal tube tip is  4.4 cm above the carina. Central catheter tip is in the right atrium just beyond the cavoatrial junction, stable. Nasogastric tube tip and side port are below the diaphragm. Pacemaker lead attached to right ventricle. No pneumothorax. There is a small right pleural effusion. There is bibasilar atelectasis. There is mild cardiomegaly with pulmonary vascularity within normal limits. No adenopathy. No bone lesions. IMPRESSION: And catheter positions as described without pneumothorax. Pacemaker lead attached to right ventricle. Right pleural effusion with bibasilar atelectasis. Stable cardiac prominence. Electronically Signed   By: Lowella Grip III M.D.   On: 11/13/2017 08:14   Dg Chest Port 1 View  Result Date: 11/12/2017 CLINICAL DATA:  OG and ETT placement EXAM: PORTABLE CHEST 1 VIEW COMPARISON:  11/12/2017, 11/03/2017 FINDINGS: Endotracheal tube tip is about 2.9 cm superior to the carina. Esophageal tube tip is in the left upper quadrant. Right-sided central venous catheter tip overlies the right atrium. Low lung volumes. Cardiomegaly with vascular congestion. Foci of airspace disease in the bilateral lung bases and left upper lobe. No pneumothorax. Left-sided pacing device similar in position. IMPRESSION: 1. Endotracheal tube tip about 2.9 cm superior to carina. Esophageal tube tip is in the left upper quadrant of the abdomen 2. Cardiomegaly with vascular congestion. 3. Hypoventilatory changes. Developing foci of airspace disease in the bilateral lung bases and left upper lobe, atelectasis versus pneumonia. Electronically Signed   By: Donavan Foil M.D.   On: 11/12/2017 20:46   Dg Chest Port 1 View  Result Date: 11/12/2017 CLINICAL DATA:  Status post dialysis catheter insertion. EXAM: PORTABLE CHEST 1 VIEW COMPARISON:  Chest x-ray of November 03, 2017 FINDINGS: The dual-lumen dialysis catheter tip projects over the distal third of the SVC. There is no postprocedure pneumothorax or hemothorax. Both lungs  are mildly hypoinflated. The cardiac silhouette is enlarged and the central pulmonary vascularity is engorged. The interstitial edema has improved. The ICD is in stable position. There is calcification in the wall of the aortic arch. IMPRESSION: No postprocedure complication following dialysis catheter placement. Improved pulmonary edema. Thoracic aortic atherosclerosis. Electronically Signed   By: David  Martinique M.D.   On: 11/12/2017 12:51   Dg Fluoro Guide Cv Line-no Report  Result Date: 11/12/2017 Fluoroscopy was utilized by the requesting physician.  No radiographic interpretation.  Medications: Infusions: . sodium chloride 10 mL/hr at 11/12/17 2200  . sodium chloride    . sodium chloride    . calcium gluconate    . fentaNYL infusion INTRAVENOUS 100 mcg/hr (11/12/17 2134)  . ferumoxytol    . heparin 10,000 units/ 20 mL infusion syringe 1,350 Units/hr (11/13/17 0737)  . norepinephrine (LEVOPHED) Adult infusion 8 mcg/min (11/13/17 0805)  . dialysis replacement fluid (prismasate) 300 mL/hr at 11/13/17 5366  . dialysis replacement fluid (prismasate) 300 mL/hr at 11/13/17 4403  . dialysate (PRISMASATE) 1,500 mL/hr at 11/13/17 4742  . propofol (DIPRIVAN) infusion Stopped (11/13/17 0557)  . sodium chloride      Scheduled Medications: . allopurinol  100 mg Per Tube BID  . amiodarone  200 mg Per Tube Daily  . chlorhexidine gluconate (MEDLINE KIT)  15 mL Mouth Rinse BID  . insulin aspart  1-3 Units Subcutaneous Q4H  . mouth rinse  15 mL Mouth Rinse QID  . sodium chloride flush  3 mL Intravenous Q12H  . sodium polystyrene  30 g Oral Once    have reviewed scheduled and prn medications.  Physical Exam: General: Intubated and sedated Heart: Regular rate rhythm S1-S2 normal Lungs: Distant breath sound Abdomen: Mild distention  extremities: Bilateral lower extremities pitting and chronic edema Dialysis Access: Right IJ tunnel catheter site looks clean.  Right AVF site has good  thrill  [employer] 11/13/2017,8:20 AM  LOS: 9 days

## 2017-11-14 ENCOUNTER — Inpatient Hospital Stay (HOSPITAL_COMMUNITY): Payer: Medicaid Other

## 2017-11-14 DIAGNOSIS — Z978 Presence of other specified devices: Secondary | ICD-10-CM

## 2017-11-14 DIAGNOSIS — I5023 Acute on chronic systolic (congestive) heart failure: Secondary | ICD-10-CM

## 2017-11-14 LAB — POCT I-STAT 3, ART BLOOD GAS (G3+)
ACID-BASE DEFICIT: 1 mmol/L (ref 0.0–2.0)
ACID-BASE DEFICIT: 1 mmol/L (ref 0.0–2.0)
Acid-base deficit: 1 mmol/L (ref 0.0–2.0)
BICARBONATE: 28.1 mmol/L — AB (ref 20.0–28.0)
BICARBONATE: 28.1 mmol/L — AB (ref 20.0–28.0)
BICARBONATE: 27.5 mmol/L (ref 20.0–28.0)
BICARBONATE: 28.4 mmol/L — AB (ref 20.0–28.0)
Bicarbonate: 28.1 mmol/L — ABNORMAL HIGH (ref 20.0–28.0)
Bicarbonate: 28.4 mmol/L — ABNORMAL HIGH (ref 20.0–28.0)
O2 SAT: 92 %
O2 SAT: 93 %
O2 SAT: 97 %
O2 SAT: 98 %
O2 SAT: 98 %
O2 Saturation: 97 %
PCO2 ART: 59.5 mmHg — AB (ref 32.0–48.0)
PCO2 ART: 60.6 mmHg — AB (ref 32.0–48.0)
PCO2 ART: 65.5 mmHg — AB (ref 32.0–48.0)
PH ART: 7.279 — AB (ref 7.350–7.450)
PH ART: 7.238 — AB (ref 7.350–7.450)
PO2 ART: 115 mmHg — AB (ref 83.0–108.0)
PO2 ART: 74 mmHg — AB (ref 83.0–108.0)
PO2 ART: 76 mmHg — AB (ref 83.0–108.0)
PO2 ART: 103 mmHg (ref 83.0–108.0)
PO2 ART: 100 mmHg (ref 83.0–108.0)
Patient temperature: 97.5
Patient temperature: 97.6
TCO2: 29 mmol/L (ref 22–32)
TCO2: 30 mmol/L (ref 22–32)
TCO2: 30 mmol/L (ref 22–32)
TCO2: 30 mmol/L (ref 22–32)
TCO2: 30 mmol/L (ref 22–32)
TCO2: 30 mmol/L (ref 22–32)
pCO2 arterial: 57.9 mmHg — ABNORMAL HIGH (ref 32.0–48.0)
pCO2 arterial: 60.8 mmHg — ABNORMAL HIGH (ref 32.0–48.0)
pCO2 arterial: 62 mmHg — ABNORMAL HIGH (ref 32.0–48.0)
pH, Arterial: 7.26 — ABNORMAL LOW (ref 7.350–7.450)
pH, Arterial: 7.264 — ABNORMAL LOW (ref 7.350–7.450)
pH, Arterial: 7.273 — ABNORMAL LOW (ref 7.350–7.450)
pH, Arterial: 7.291 — ABNORMAL LOW (ref 7.350–7.450)
pO2, Arterial: 119 mmHg — ABNORMAL HIGH (ref 83.0–108.0)

## 2017-11-14 LAB — RENAL FUNCTION PANEL
Albumin: 3.2 g/dL — ABNORMAL LOW (ref 3.5–5.0)
Albumin: 3.5 g/dL (ref 3.5–5.0)
Anion gap: 11 (ref 5–15)
Anion gap: 10 (ref 5–15)
BUN: 41 mg/dL — AB (ref 6–20)
BUN: 35 mg/dL — AB (ref 6–20)
CALCIUM: 8.7 mg/dL — AB (ref 8.9–10.3)
CHLORIDE: 101 mmol/L (ref 101–111)
CO2: 23 mmol/L (ref 22–32)
CO2: 25 mmol/L (ref 22–32)
CREATININE: 3.03 mg/dL — AB (ref 0.61–1.24)
Calcium: 8.7 mg/dL — ABNORMAL LOW (ref 8.9–10.3)
Chloride: 101 mmol/L (ref 101–111)
Creatinine, Ser: 2.94 mg/dL — ABNORMAL HIGH (ref 0.61–1.24)
GFR calc Af Amer: 26 mL/min — ABNORMAL LOW (ref >60)
GFR calc non Af Amer: 21 mL/min — ABNORMAL LOW (ref >60)
GFR calc non Af Amer: 22 mL/min — ABNORMAL LOW (ref >60)
GFR, EST AFRICAN AMERICAN: 25 mL/min — AB (ref >60)
Glucose, Bld: 110 mg/dL — ABNORMAL HIGH (ref 65–99)
Glucose, Bld: 97 mg/dL (ref 65–99)
POTASSIUM: 4.6 mmol/L (ref 3.5–5.1)
Phosphorus: 3.4 mg/dL (ref 2.5–4.6)
Phosphorus: 5.1 mg/dL — ABNORMAL HIGH (ref 2.5–4.6)
Potassium: 4.3 mmol/L (ref 3.5–5.1)
SODIUM: 135 mmol/L (ref 135–145)
Sodium: 136 mmol/L (ref 135–145)

## 2017-11-14 LAB — CBC
HCT: 38.2 % — ABNORMAL LOW (ref 39.0–52.0)
Hemoglobin: 11.7 g/dL — ABNORMAL LOW (ref 13.0–17.0)
MCH: 26.3 pg (ref 26.0–34.0)
MCHC: 30.6 g/dL (ref 30.0–36.0)
MCV: 85.8 fL (ref 78.0–100.0)
PLATELETS: 125 10*3/uL — AB (ref 150–400)
RBC: 4.45 MIL/uL (ref 4.22–5.81)
RDW: 16.1 % — AB (ref 11.5–15.5)
WBC: 24.9 10*3/uL — AB (ref 4.0–10.5)

## 2017-11-14 LAB — BLOOD GAS, ARTERIAL
Acid-Base Excess: 1.8 mmol/L (ref 0.0–2.0)
Bicarbonate: 25.7 mmol/L (ref 20.0–28.0)
FIO2: 40
MECHVT: 570 mL
O2 Saturation: 98.3 %
PATIENT TEMPERATURE: 98.6
PEEP: 8 cmH2O
RATE: 12 resp/min
pCO2 arterial: 39.9 mmHg (ref 32.0–48.0)
pH, Arterial: 7.425 (ref 7.350–7.450)
pO2, Arterial: 118 mmHg — ABNORMAL HIGH (ref 83.0–108.0)

## 2017-11-14 LAB — POCT I-STAT, CHEM 8
BUN: 40 mg/dL — AB (ref 6–20)
CHLORIDE: 103 mmol/L (ref 101–111)
Calcium, Ion: 1.11 mmol/L — ABNORMAL LOW (ref 1.15–1.40)
Creatinine, Ser: 3 mg/dL — ABNORMAL HIGH (ref 0.61–1.24)
Glucose, Bld: 112 mg/dL — ABNORMAL HIGH (ref 65–99)
HEMATOCRIT: 41 % (ref 39.0–52.0)
Hemoglobin: 13.9 g/dL (ref 13.0–17.0)
Potassium: 4.6 mmol/L (ref 3.5–5.1)
SODIUM: 138 mmol/L (ref 135–145)
TCO2: 25 mmol/L (ref 22–32)

## 2017-11-14 LAB — HEPARIN LEVEL (UNFRACTIONATED)
Heparin Unfractionated: 0.73 IU/mL — ABNORMAL HIGH (ref 0.30–0.70)
Heparin Unfractionated: 0.48 IU/mL (ref 0.30–0.70)

## 2017-11-14 LAB — GLUCOSE, CAPILLARY
GLUCOSE-CAPILLARY: 101 mg/dL — AB (ref 65–99)
GLUCOSE-CAPILLARY: 97 mg/dL (ref 65–99)
Glucose-Capillary: 105 mg/dL — ABNORMAL HIGH (ref 65–99)
Glucose-Capillary: 105 mg/dL — ABNORMAL HIGH (ref 65–99)
Glucose-Capillary: 80 mg/dL (ref 65–99)
Glucose-Capillary: 89 mg/dL (ref 65–99)
Glucose-Capillary: 95 mg/dL (ref 65–99)

## 2017-11-14 LAB — POCT I-STAT EG7
ACID-BASE EXCESS: 3 mmol/L — AB (ref 0.0–2.0)
Bicarbonate: 30.4 mmol/L — ABNORMAL HIGH (ref 20.0–28.0)
Calcium, Ion: 1.19 mmol/L (ref 1.15–1.40)
HEMATOCRIT: 37 % — AB (ref 39.0–52.0)
HEMOGLOBIN: 12.6 g/dL — AB (ref 13.0–17.0)
O2 SAT: 87 %
PH VEN: 7.311 (ref 7.250–7.430)
PO2 VEN: 59 mmHg — AB (ref 32.0–45.0)
Potassium: 5.6 mmol/L — ABNORMAL HIGH (ref 3.5–5.1)
Sodium: 138 mmol/L (ref 135–145)
TCO2: 32 mmol/L (ref 22–32)
pCO2, Ven: 60.3 mmHg — ABNORMAL HIGH (ref 44.0–60.0)

## 2017-11-14 LAB — MAGNESIUM: MAGNESIUM: 2.7 mg/dL — AB (ref 1.7–2.4)

## 2017-11-14 LAB — APTT: aPTT: 33 seconds (ref 24–36)

## 2017-11-14 LAB — POCT ACTIVATED CLOTTING TIME: Activated Clotting Time: 158 seconds

## 2017-11-14 MED ORDER — IPRATROPIUM-ALBUTEROL 0.5-2.5 (3) MG/3ML IN SOLN
3.0000 mL | Freq: Four times a day (QID) | RESPIRATORY_TRACT | Status: DC
  Administered 2017-11-14 – 2017-11-16 (×7): 3 mL via RESPIRATORY_TRACT
  Filled 2017-11-14 (×7): qty 3

## 2017-11-14 MED ORDER — PIPERACILLIN-TAZOBACTAM 3.375 G IVPB 30 MIN
3.3750 g | Freq: Four times a day (QID) | INTRAVENOUS | Status: DC
  Administered 2017-11-14 – 2017-11-16 (×8): 3.375 g via INTRAVENOUS
  Filled 2017-11-14 (×9): qty 50

## 2017-11-14 MED ORDER — ALBUTEROL SULFATE (2.5 MG/3ML) 0.083% IN NEBU: 2.5000 mg | INHALATION_SOLUTION | RESPIRATORY_TRACT | Status: DC | PRN

## 2017-11-14 MED ORDER — VANCOMYCIN HCL 10 G IV SOLR
2000.0000 mg | Freq: Once | INTRAVENOUS | Status: AC
  Administered 2017-11-14: 2000 mg via INTRAVENOUS
  Filled 2017-11-14: qty 2000

## 2017-11-14 MED ORDER — CHLORHEXIDINE GLUCONATE CLOTH 2 % EX PADS
6.0000 | MEDICATED_PAD | Freq: Every day | CUTANEOUS | Status: DC
  Administered 2017-11-15 – 2017-11-20 (×5): 6 via TOPICAL

## 2017-11-14 NOTE — Progress Notes (Signed)
PT Cancellation Note  Patient Details Name: Eric Murillo MRN: [id number] DOB: Jul 29, 1960   Cancelled Treatment:    Reason Eval/Treat Not Completed: Patient not medically ready. Currently on CRRT. Per RN, plan for extubation today. Will follow-up for PT treatment tomorrow when medically appropriate.  Ina Homes, PT, DPT Acute Rehab Services  Pager: [phone]  Malachy Chamber 11/14/2017, 10:02 AM

## 2017-11-14 NOTE — Progress Notes (Signed)
Patient was extubated in am and doing well ABG acceptable AAO and able to answer question Will continue to monitor, Use BIPAP at night and prn  Results for Eric Murillo, Eric Murillo (MRN [id number]) as of 11/14/2017 14:22  Ref. Range 11/14/2017 05:00 11/14/2017 10:58 11/14/2017 12:29  pH, Arterial Latest Ref Range: 7.350 - 7.450  7.425 7.291 (L) 7.279 (L)  pCO2 arterial Latest Ref Range: 32.0 - 48.0 mmHg 39.9 57.9 (H) 59.5 (H)  pO2, Arterial Latest Ref Range: 83.0 - 108.0 mmHg 118 (H) 115.0 (H) 74.0 (L)  TCO2 Latest Ref Range: 22 - 32 mmol/L  30 30  Acid-Base Excess Latest Ref Range: 0.0 - 2.0 mmol/L 1.8    Bicarbonate Latest Ref Range: 20.0 - 28.0 mmol/L 25.7 28.1 (H) 28.1 (H)  O2 Saturation Latest Units: % 98.3 98.0 93.0  Patient temperature Unknown 98.6 97.4 F 97.5 F  Allens test (pass/fail) Latest Ref Range: PASS  PASS

## 2017-11-14 NOTE — Progress Notes (Signed)
ANTICOAGULATION CONSULT NOTE - Follow Up Consult  Pharmacy Consult for heparin Indication: atrial fibrillation  Allergies  Allergen Reactions  . Nsaids Other (See Comments)    Cannot take to due kidney issues  . Beta Adrenergic Blockers Other (See Comments)    "An unnamed, white-colored Beta Blocker made" him "feel funny" = made him feel cagey    Patient Measurements: Height: 5' 9.5" (176.5 cm) Weight: 298 lb 11.6 oz (135.5 kg) IBW/kg (Calculated) : 71.85 Heparin Dosing Weight: 104.9 kg  Vital Signs: Temp: 97.4 F (36.3 C) (04/24 1200) Temp Source: Oral (04/24 1200) BP: 140/63 (04/24 1200) Pulse Rate: 85 (04/24 0835)  Labs: Recent Labs    11/12/17 1104 11/12/17 1929  11/13/17 0400 11/13/17 1640 11/13/17 1830 11/14/17 0018 11/14/17 0252 11/14/17 1300  HGB 11.7* 11.6*  --   --   --   --  13.9 11.7*  --   HCT 39.7 40.2  --   --   --   --  41.0 38.2*  --   PLT 176 156  --   --   --   --   --  125*  --   APTT  --   --   --  62*  --   --   --  33  --   LABPROT 18.2* 19.9*  --   --   --   --   --   --   --   INR 1.53 1.70  --   --   --   --   --   --   --   HEPARINUNFRC  --   --   --   --   --  0.76*  --  0.73* 0.48  CREATININE 4.31* 5.66*   < > 4.16* 3.53*  --  3.00* 3.03*  --    < > = values in this interval not displayed.    Estimated Creatinine Clearance: 37 mL/min (A) (by C-G formula based on SCr of 3.03 mg/dL (H)).    Assessment: 57 YO male admitted w/a flutter and s/p cardiac arrest on 4/22 due to hyperkalemia.  Was on apixaban 5mg  BID for Afib PTA but switched to heparin d/t tunneled cath placement for CVVHD. Will restart apixaban at some point.   Pharmacy consulted for heparin dosing for Afib- remains in Afib today. Heparin level 0.48, Hgb 11.7, Plt 125, no reported bleeding.   Goal of Therapy:  Heparin level 0.3-0.7 units/ml Monitor platelets by anticoagulation protocol: Yes   Plan:  Continue heparin 1100 units/hour Continue to monitor H&H and  platelets  Daily heparin level Monitor s/sx of bleeding.   Emeline General, PharmD Candidate 11/14/2017,1:43 PM

## 2017-11-14 NOTE — Progress Notes (Signed)
[facility] NEPHROLOGY PROGRESS NOTE  Assessment/ Plan: Pt is a 57 y.o. yo male with chronic kidney disease stage IV, admitted with  congestive heart failure exacerbation and worsening renal failure.  Placement of right IJ tunnel catheter and AV fistula on 11/12/17 by VVS.  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  Placement of right upper extremity AV fistula and right IJ tunneled catheterby VVS on 4/22.  Patient had a cardiac arrest on 4/22 and transferred to ICU.  Started on CRRT.  Tolerating well with UF of 3700 cc.  He still requiring pressors.  I am planning to continue CRRT today and possibly switching to intermittent hemodialysis tomorrow. The dialysis bath was changed to 0 okay last night, follow-up BMP from today afternoon.  #Hyperkalemia: improved. Monitor BMP.  #Acute respiratory failure with hypoxia: Currently intubated.  #CHF exacerbation with volume overload: He has leg edema.  Try to ultrafiltrate with dialysis.  #Hypotension on Levophed.  Titrate as needed.  Monitor blood pressure.  Currently on Levophed.  #Anemia of CKD: Hemoglobin 11.7.  Iron saturation 17% with a serum iron of 78.  Ordered a dose of Feraheme 510 mg IV on 4/22.  #Secondary hyperparathyroidism:  Phosphorus 3.3.  Off binders  #Nonischemic cardiomyopathy status post ICD: EF of 25-30%.  #A. fib/flutter: On amiodarone  Subjective: Seen and examined in ICU.  Looks more alert and following simple commands.  He still intubated.  Tolerating CRRT well.  Objective Vital signs in last 24 hours: Vitals:   11/14/17 0700 11/14/17 0729 11/14/17 0800 11/14/17 0835  BP:   140/79 140/79  Pulse:    85  Resp: (!) 24 (!) _0 Temp:      TempSrc:      SpO2: 100% 100% 99% 99%  Weight:      Height:       Weight change: -4.5 kg (-9 lb 14.7 oz)  Intake/Output Summary (Last 24 hours) at 11/14/2017 0841 Last data filed at 11/14/2017 0800 Gross  per 24 hour  Intake 1124.96 ml  Output 5338 ml  Net -4213.04 ml       Labs: Basic Metabolic Panel: Recent Labs  Lab 11/13/17 0400 11/13/17 1640 11/14/17 0018 11/14/17 0252  NA 136 136 138 135  K 4.6 5.3* 4.6 4.3  CL 101 100* 103 101  CO2 24 23  --  23  GLUCOSE 132* 103* 112* 110*  BUN 55* 47* 40* 41*  CREATININE 4.16* 3.53* 3.00* 3.03*  CALCIUM 8.9 8.7*  --  8.7*  PHOS 3.4  3.5 3.0  --  3.4   Liver Function Tests: Recent Labs  Lab 11/12/17 1929 11/13/17 0400 11/13/17 1640 11/14/17 0252  AST 77*  --   --   --   ALT 50  --   --   --   ALKPHOS 182*  --   --   --   BILITOT 2.1*  --   --   --   PROT 7.0  --   --   --   ALBUMIN 3.3* 3.2* 3.3* 3.2*   No results for input(s): LIPASE, AMYLASE in the last 168 hours. No results for input(s): AMMONIA in the last 168 hours. CBC: Recent Labs  Lab 11/08/17 0407  11/09/17 0034 11/10/17 0558 11/11/17 0753  11/12/17 1104 11/12/17 1929 11/14/17 0018 11/14/17 0252  WBC 10.7*   < > 11.1* 13.6* 12.9*  --  11.5* 11.9*  --  24.9*  NEUTROABS 7.9*  --  8.5* 10.1*  --   --   --   --   --   --   HGB 10.5*   < > 10.7* 10.4* 10.6*   < > 11.7* 11.6* 13.9 11.7*  HCT 35.3*   < > 36.6* 35.5* 36.7*   < > 39.7 40.2 41.0 38.2*  MCV 88.9   < > 88.6 89.0 88.9  --  89.2 93.3  --  85.8  PLT 247   < > 253 241 213  --  176 156  --  125*   < > = values in this interval not displayed.   Cardiac Enzymes: No results for input(s): CKTOTAL, CKMB, CKMBINDEX, TROPONINI in the last 168 hours. CBG: Recent Labs  Lab 11/13/17 1640 11/13/17 2013 11/14/17 0013 11/14/17 0342 11/14/17 0727  GLUCAP 104* 104* 105* 105* 97    Iron Studies:  Recent Labs    11/12/17 1104  IRON 78  TIBC 468*  FERRITIN 36   Studies/Results: Portable Chest Xray  Result Date: 11/13/2017 CLINICAL DATA:  Hypoxia EXAM: PORTABLE CHEST 1 VIEW COMPARISON:  November 12, 2017 FINDINGS: Endotracheal tube tip is 4.4 cm above the carina. Central catheter tip is in the right  atrium just beyond the cavoatrial junction, stable. Nasogastric tube tip and side port are below the diaphragm. Pacemaker lead attached to right ventricle. No pneumothorax. There is a small right pleural effusion. There is bibasilar atelectasis. There is mild cardiomegaly with pulmonary vascularity within normal limits. No adenopathy. No bone lesions. IMPRESSION: And catheter positions as described without pneumothorax. Pacemaker lead attached to right ventricle. Right pleural effusion with bibasilar atelectasis. Stable cardiac prominence. Electronically Signed   By: Lowella Grip III M.D.   On: 11/13/2017 08:14   Dg Chest Port 1 View  Result Date: 11/12/2017 CLINICAL DATA:  OG and ETT placement EXAM: PORTABLE CHEST 1 VIEW COMPARISON:  11/12/2017, 11/03/2017 FINDINGS: Endotracheal tube tip is about 2.9 cm superior to the carina. Esophageal tube tip is in the left upper quadrant. Right-sided central venous catheter tip overlies the right atrium. Low lung volumes. Cardiomegaly with vascular congestion. Foci of airspace disease in the bilateral lung bases and left upper lobe. No pneumothorax. Left-sided pacing device similar in position. IMPRESSION: 1. Endotracheal tube tip about 2.9 cm superior to carina. Esophageal tube tip is in the left upper quadrant of the abdomen 2. Cardiomegaly with vascular congestion. 3. Hypoventilatory changes. Developing foci of airspace disease in the bilateral lung bases and left upper lobe, atelectasis versus pneumonia. Electronically Signed   By: Donavan Foil M.D.   On: 11/12/2017 20:46   Dg Chest Port 1 View  Result Date: 11/12/2017 CLINICAL DATA:  Status post dialysis catheter insertion. EXAM: PORTABLE CHEST 1 VIEW COMPARISON:  Chest x-ray of November 03, 2017 FINDINGS: The dual-lumen dialysis catheter tip projects over the distal third of the SVC. There is no postprocedure pneumothorax or hemothorax. Both lungs are mildly hypoinflated. The cardiac silhouette is enlarged and  the central pulmonary vascularity is engorged. The interstitial edema has improved. The ICD is in stable position. There is calcification in the wall of the aortic arch. IMPRESSION: No postprocedure complication following dialysis catheter placement. Improved pulmonary edema. Thoracic aortic atherosclerosis. Electronically Signed   By: David  Martinique M.D.   On: 11/12/2017 12:51   Dg Fluoro Guide Cv Line-no Report  Result Date: 11/12/2017 Fluoroscopy was utilized by the requesting physician.  No radiographic interpretation.    Medications: Infusions: . sodium chloride  Stopped (11/14/17 0800)  . sodium chloride    . sodium chloride    . calcium gluconate    . famotidine (PEPCID) IV Stopped (11/13/17 1440)  . fentaNYL infusion INTRAVENOUS 50 mcg/hr (11/14/17 0729)  . ferumoxytol    . heparin 1,100 Units/hr (11/14/17 0800)  . norepinephrine (LEVOPHED) Adult infusion 4 mcg/min (11/14/17 0800)  . dialysis replacement fluid (prismasate) 300 mL/hr at 11/13/17 2033  . dialysis replacement fluid (prismasate) 300 mL/hr at 11/13/17 2033  . dialysate (PRISMASATE) 1,500 mL/hr at 11/14/17 4536  . propofol (DIPRIVAN) infusion Stopped (11/13/17 0557)  . sodium chloride      Scheduled Medications: . allopurinol  100 mg Per Tube BID  . amiodarone  200 mg Per Tube Daily  . chlorhexidine gluconate (MEDLINE KIT)  15 mL Mouth Rinse BID  . insulin aspart  1-3 Units Subcutaneous Q4H  . mouth rinse  15 mL Mouth Rinse Q2H  . sodium chloride flush  3 mL Intravenous Q12H  . sodium polystyrene  30 g Oral Once    have reviewed scheduled and prn medications.  Physical Exam: General: Intubated, alert awake Heart: Regular rate rhythm S1-S2 normal, no rubs  Lungs: Distance breath sound Abdomen: Mild distention, soft. extremities: Bilateral lower extremities pitting and chronic edema Dialysis Access: Right IJ tunnel catheter site looks clean.  Right AVF site has good thrill  Ruthel Martine Prasad  Ashutosh Dieguez 11/14/2017,8:41 AM  LOS: 10 days

## 2017-11-14 NOTE — Progress Notes (Signed)
+   thrill in fistula Right arm incision clean healing Using right neck catheter without difficulty No numbness or tingling in hand Will sign off.  Please call with questions.  I will have our office schedule follow up in 4-6 weeks  Fabienne Bruns, MD [facility] Office: [phone] Pager: [phone]

## 2017-11-14 NOTE — Progress Notes (Signed)
Patient post extubation showing sign of little CO2 retention and dropping PH will place on BIPAp and rpt abg in about 1 and half hour and if continue to show detorioaration will consider re intubation  Odetta Forness [facility] Pulmonary Critical Care & Sleep Medicine

## 2017-11-14 NOTE — Progress Notes (Signed)
Pulmonary/Critical Care Progress Note  Patient Name: Eric Murillo MRN: [id number] DOB: March 06, 1961    ADMISSION DATE:  11/03/2017 CONSULTATION DATE:  11/12/2017  REFERRING MD:  Dr. Marigene Ehlers  REASON FOR CONSULTATION: Cardiac arrest   ASSESSMENT/PLAN:  ASSESSMENT (included in the Hospital Problem List)   PULMONARY  Acute hypoxemic respiratory failure  Obstructive sleep apnea, untreated Continue mechanical ventilatory support.   Spontaneous breathing trial in a.m.  VAP bundle IF ABG acceptable consider extubation Use CPAP at night for OSA if patient able to tolerate it  CARDIOVASCULAR  Acute on chronic systolic heart failure  Atrial fibrillation, rate controlled  Wide-complex tachyarrhythmia/cardiac arrest/asystole/ventricular fibrillation Management per advanced heart failure service. Off levophed and doing well  ID: ? Aspiration on left WBC is 24 today Will get blood culture and sputum culture Will add vanco and zosy started on 4/24  Adjust with culture Has multiple lines and catheters. DC a Line  DC femoral if able to get peripheral lines   RENAL  End-stage renal disease  Hyperkalemia  Metabolic acidosis CRRT per Nephrology  GASTROINTESTINAL  NUTRITION: N.p.o. for now in anticipation of liberation from mechanical ventilatory support in a.m.  HEMATOLOGIC  Anemia Eliquis to be restarted at the discretion of the primary team  ENDOCRINE  Hyperglycemia Monitor fingerstick glucose. Sliding scale insulin.  NEUROLOGIC  Acute metabolic encephalopathy, improved Titrate sedation (propofol infusion) for RASS -1   Skin/Wound: chronic changes   Electrolytes: Replace electrolytes per ICU electrolyte replacement protocol.   IVF: none  Nutrition: NPO for extubation  Prophylaxis: DVT Prophylaxis with heparin,. GI Prophylaxis.   Restraints: None  PT/OT eval and treat. OOB when appropriate.   Lines/Tubes:  No  foley  Left femoral 4/22 central  line.  ADVANCE DIRECTIVE:Full code  FAMILY DISCUSSION:Spoke with family at bedside  Quality Care: PPI, DVT prophylaxis, HOB elevated, Infection control all reviewed and addressed.  Events and notes from last 24 hours reviewed. Care plan discussed on multidisciplinary rounds  CC TIME:45 min   Old records reviewed discussed results and management plan with patient  Images personally reviewed and results and labs reviewed and discussed with patient.  All medication reviewed and adjusted  Further management depending on test results and work up as outlined above.    Addendum: ABG on SBT looks like PH of 7.29 with PCO2 of 55 but patient is very awake and responding appropriately will extubate with BIPAP and RPT abg in 1 hr      SUBJECTIVE:   -Interim events: Continue to do well, Awake and following command on vent, ON PS and doing well, HR is controlled and K is 4 WBC went up to 24 No fever noted     HISTORY OF PRESENT ILLNESS This 57 year old African-American male presented with flulike symptoms 4/13 and was found to be in atrial fibrillation with profound volume overload.  He has CKD stage IV at baseline but presented with acute worsening.  Attempts at diuretic therapy failed.  HD catheter was placed for initiation of HD on 419.  He underwent right upper extremity fistula placement on 4/22.  Postoperatively, he developed worsening agitation and hypoxia.  He subsequently developed a wide-complex tachycardia, prompting approximately 55mnute defibrillation.  He was found to be hyperkalemic.  [place]   Current Facility-Administered Medications:  .  0.9 %  sodium chloride infusion, 250 mL, Intravenous, PRN, Rhyne, SHulen Shouts PA-C, Stopped at 11/14/17 0800 .  0.9 %  sodium chloride infusion, 100 mL, Intravenous, PRN, Rhyne, Samantha J, PA-C .  0.9 %  sodium chloride infusion, 100 mL, Intravenous, PRN,  Rhyne, Samantha J, PA-C .  acetaminophen (TYLENOL) solution 650 mg, 650 mg, Per Tube, Q4H PRN, Larey Dresser, MD .  allopurinol (ZYLOPRIM) tablet 100 mg, 100 mg, Per Tube, BID, Larey Dresser, MD, 100 mg at 11/14/17 0900 .  alteplase (CATHFLO ACTIVASE) injection 2 mg, 2 mg, Intracatheter, Once PRN, Rhyne, Samantha J, PA-C .  amiodarone (PACERONE) tablet 200 mg, 200 mg, Per Tube, Daily, Larey Dresser, MD, 200 mg at 11/14/17 0900 .  bisacodyl (DULCOLAX) suppository 10 mg, 10 mg, Rectal, Daily PRN, Larey Dresser, MD .  calcium gluconate 1 g in sodium chloride 0.9 % 100 mL IVPB, 1 g, Intravenous, Once, Larey Dresser, MD .  chlorhexidine gluconate (MEDLINE KIT) (PERIDEX) 0.12 % solution 15 mL, 15 mL, Mouth Rinse, BID, Corey Harold, NP, 15 mL at 11/14/17 0800 .  docusate (COLACE) 50 MG/5ML liquid 100 mg, 100 mg, Per Tube, BID PRN, Larey Dresser, MD .  famotidine (PEPCID) IVPB 20 mg premix, 20 mg, Intravenous, Q24H, Shirley Friar, PA-C, Stopped at 11/13/17 1440 .  fentaNYL (SUBLIMAZE) bolus via infusion 25 mcg, 25 mcg, Intravenous, Q1H PRN, Larey Dresser, MD .  fentaNYL 255mg in NS 2542m(108mml) infusion-PREMIX, 25-400 mcg/hr, Intravenous, Continuous, McLLarey DresserD, Stopped at 11/14/17 0830 .  ferumoxytol (FERAHEME) 510 mg in sodium chloride 0.9 % 100 mL IVPB, 510 mg, Intravenous, Once, BhaRosita FireD .  heparin ADULT infusion 100 units/mL (25000 units/250m72mdium chloride 0.45%), 1,100 Units/hr, Intravenous, Continuous, BrykLaren EvertsH, Last Rate: 11 mL/hr at 11/14/17 0800, 1,100 Units/hr at 11/14/17 0800 .  heparin injection 1,000 Units, 1,000 Units, Dialysis, PRN, Rhyne, Samantha J, PA-C .  insulin aspart (novoLOG) injection 1-3 Units, 1-3 Units, Subcutaneous, Q4H, HoffCorey Harold, 1 Units at 11/13/17 0450 .  lidocaine (PF) (XYLOCAINE) 1 % injection 5 mL, 5 mL, Intradermal, PRN, Rhyne, Samantha J, PA-C .  lidocaine (PF) (XYLOCAINE) 1 %  injection, , , PRN, McCuJacqulynn Cadet, 10 mL at 11/08/17 1813 .  lidocaine-prilocaine (EMLA) cream 1 application, 1 application, Topical, PRN, Rhyne, Samantha J, PA-C .  MEDLINE mouth rinse, 15 mL, Mouth Rinse, Q2H, McLeLarey Dresser, 15 mL at 11/14/17 1000 .  norepinephrine (LEVOPHED) 16 mg in dextrose 5 % 250 mL (0.064 mg/mL) infusion, 0-40 mcg/min, Intravenous, Titrated, Bensimhon, DaniShaune Pascal, Stopped at 11/14/17 0900 .  pentafluoroprop-tetrafluoroeth (GEBAUERS) aerosol 1 application, 1 application, Topical, PRN, Rhyne, Samantha J, PA-C .  piperacillin-tazobactam (ZOSYN) IVPB 3.375 g, 3.375 g, Intravenous, Q6H, Kyros Salzwedel, MD .  prismasol BGK 0/2.5 5,000 mL with potassium chloride 10 mEq dialysis replacement fluid, , CRRT, Continuous, Lin,Dwana Melena, Last Rate: 300 mL/hr at 11/13/17 2033 .  prismasol BGK 0/2.5 5,000 mL with potassium chloride 10 mEq dialysis replacement fluid, , CRRT, Continuous, Lin,Dwana Melena, Last Rate: 300 mL/hr at 11/13/17 2033 .  prismasol BGK 0/2.5 5,000 mL with potassium chloride 10 mEq dialysis solution, , CRRT, Continuous, Lin,Dwana Melena, Last Rate: 1,500 mL/hr at 11/14/17 0958 .  propofol (DIPRIVAN) 1000 MG/100ML infusion, 5-80 mcg/kg/min, Intravenous, Titrated, Thomas, Tijo, DO, Stopped at 11/13/17 0557 .  sodium chloride 0.9 % primer fluid for CRRT, , CRRT, PRN, GoldCorliss Parish .  sodium chloride flush (NS) 0.9 % injection 3 mL, 3 mL, Intravenous, Q12H, Rhyne, SamaAldona Bar  J, PA-C, 3 mL at 11/14/17 0907 .  sodium chloride flush (NS) 0.9 % injection 3 mL, 3 mL, Intravenous, PRN, Rhyne, Samantha J, PA-C, 3 mL at 11/03/17 2146 .  sodium polystyrene (KAYEXALATE) 15 GM/60ML suspension 30 g, 30 g, Oral, Once, Bensimhon, Shaune Pascal, MD   OBJECTIVE:   VITAL SIGNS: BP (!) 162/103 (BP Location: Right Leg)   Pulse 85   Temp (!) 97.4 F (36.3 C) (Oral)   Resp 12   Ht 5' 9.5" (1.765 m)   Wt 135.5 kg (298 lb 11.6 oz)   SpO2 95%   BMI 43.48 kg/m    Vitals:   11/14/17 0835 11/14/17 0846 11/14/17 0900 11/14/17 1000  BP: 140/79   (!) 162/103  Pulse: 85     Resp: 15 (!) _0 Temp:      TempSrc:      SpO2: 99% 99% 94% 95%  Weight:      Height:        HEMODYNAMICS: CVP:  [15 mmHg-21 mmHg] 18 mmHg  VENTILATOR SETTINGS: Vent Mode: PSV;CPAP FiO2 (%):  [40 %-50 %] 40 % Set Rate:  [24 bmp] 24 bmp Vt Set:  [570 mL] 570 mL PEEP:  [5 cmH20-8 cmH20] 5 cmH20 Pressure Support:  [5 cmH20] 5 cmH20 Plateau Pressure:  [19 cmH20-21 cmH20] 20 cmH20  INTAKE / OUTPUT: I/O last 3 completed shifts: In: 1575 [I.V.:1345; NG/GT:180; IV Piggyback:50] Out: 2878 [Urine:1400; Other:4176]  PHYSICAL EXAMINATION: GENERAL: Intubated  Easily arousable.  Interactive and follows commands. HEAD: normocephalic, atraumatic EYE: PERRLA, EOM intact, no scleral icterus, no pallor. THROAT/ORAL CAVITY: ETT in situ. Mallampati classification cannot be determined at this time . NECK: supple, no thyromegaly, no JVD, no lymphadenopathy. Trachea midline. CHEST/LUNG: symmetric in development and expansion. Good air entry. no crackles. No wheezes. HEART: Irregularly irregular rate and rhythm without murmur, rub or gallop. ABDOMEN: soft, nontender, nondistended. Normoactive bowel sounds. No rebound. No guarding. No hepatosplenomegaly. EXTREMITIES: Edema: 2+ and improving. No cyanosis. No clubbing. 2+ DP pulses LYMPHATIC: no cervical/axiallary/inguinal lymph nodes appreciated MUSCULOSKELETAL: no joint tenderness, deformity or swelling. SKIN:  No rash or lesion. NEUROLOGIC: Doll's eyes intact. Corneal reflex intact. Spontaneous respirations intact. Cranial nerves II-XII are grossly symmetric and physiologic. Babinski absent. No sensory deficit. Motor: 5/5 @ RUE, 5/5 @ LUE, 5/5 @ RLL,  5/5 @ LLL.  DTR: 2+ @ R biceps, 2+ @ L biceps, 2+ @ R patellar,  2+ @ L patellar. No cerebellar signs. Gait was not assessed.   LABS:  SERUM CHEMISTRY Recent Labs  Lab  11/12/17 1929  11/13/17 0400 11/13/17 1640 11/14/17 0018 11/14/17 0252  NA 136   < > 136 136 138 135  K 6.1*   < > 4.6 5.3* 4.6 4.3  CL 98*   < > 101 100* 103 101  CO2 22   < > 24 23  --  23  BUN 57*   < > 55* 47* 40* 41*  CREATININE 5.66*   < > 4.16* 3.53* 3.00* 3.03*  GLUCOSE 213*   < > 132* 103* 112* 110*  CALCIUM 9.3   < > 8.9 8.7*  --  8.7*  MG 2.6*  --  2.4  --   --  2.7*  PHOS  --   --  3.4  3.5 3.0  --  3.4   < > = values in this interval not displayed.    Glucose Recent Labs  Lab 11/13/17 1240 11/13/17 1640 11/13/17 2013 11/14/17 0013  11/14/17 0342 11/14/17 0727  GLUCAP 101* 104* 104* 105* 105* 97    Liver Enzymes Recent Labs  Lab 11/12/17 1929 11/13/17 0400 11/13/17 1640 11/14/17 0252  AST 77*  --   --   --   ALT 50  --   --   --   ALKPHOS 182*  --   --   --   BILITOT 2.1*  --   --   --   ALBUMIN 3.3* 3.2* 3.3* 3.2*    CBC Recent Labs  Lab 11/12/17 1104 11/12/17 1929 11/14/17 0018 11/14/17 0252  WBC 11.5* 11.9*  --  24.9*  HGB 11.7* 11.6* 13.9 11.7*  HCT 39.7 40.2 41.0 38.2*  PLT 176 156  --  125*    COAGULATION STUDIES Recent Labs  Lab 11/09/17 0034 11/12/17 1104 11/12/17 1929 11/13/17 0400 11/14/17 0252  APTT  --   --   --  62* 33  INR 1.85 1.53 1.70  --   --     Sepsis Markers Recent Labs  Lab 11/12/17 1929 11/12/17 1936 11/13/17 0055 11/13/17 0400  LATICACIDVEN  --  5.7* 1.9 1.8  PROCALCITON 1.30  --   --   --     ABG Recent Labs  Lab 11/12/17 1957 11/12/17 2104 11/14/17 0500  PHART 7.138* 7.300* 7.425  PCO2ART 71.3* 50.6* 39.9  PO2ART 100.0 153.0* 118*    Cardiac Enzymes No results for input(s): TROPONINI, PROBNP in the last 168 hours.  CULTURES: Results for orders placed or performed during the hospital encounter of 11/03/17  Surgical pcr screen     Status: None   Collection Time: 11/08/17  9:58 PM  Result Value Ref Range Status   MRSA, PCR NEGATIVE NEGATIVE Final   Staphylococcus aureus NEGATIVE  NEGATIVE Final    Comment: (NOTE) The Xpert SA Assay (FDA approved for NASAL specimens in patients 86 years of age and older), is one component of a comprehensive surveillance program. It is not intended to diagnose infection nor to guide or monitor treatment. Performed at [facility], [place] [address] [place], [place] [address]     IMAGING: No results found.  OTHER STUDIES:  -  ANTIBIOTICS: Cefazolin (4/22, perioperatively)  SIGNIFICANT EVENTS: 4/22: Wide-complex tachyarrhythmia, prompting ACLS, intubation and transfer to ICU  LINES/TUBES: L fem CVL 4/22 > Perm cath RIJ 4/22 > Radial art 4/22 > ETT 4/22 >   Jaylin Roundy [facility]

## 2017-11-14 NOTE — Progress Notes (Signed)
MD notified of ABG's ph trending down, and co2 trending up.  Orders received for bipap and then repeat ABG in 90 min.  Pt A&O at baseline.  Slightly drowsy as he has been all day.  Will continue to monitor closely and update as needed.

## 2017-11-14 NOTE — Procedures (Signed)
Extubation Procedure Note  Patient Details:   Name: Eric Murillo DOB: March 28, 1961 MRN: [id number]   Airway Documentation:    Vent end date: 11/14/17 Vent end time: 1110   Evaluation  O2 sats: stable throughout Complications: No apparent complications Patient did tolerate procedure well. Bilateral Breath Sounds: Clear, Diminished   Yes   Patient extubated to 4L [place]. Patient tolerating well at this time. RT will continue to monitor.   Ave Filter 11/14/2017, 11:19 AM

## 2017-11-14 NOTE — Progress Notes (Signed)
ABG on BIPAP showing improvement  Will continue bipap Adjust to 10/8 and back up rate to 12 and RPT abg around 9 pm  Monitor closely on bipap  [name] [facility] Critical Care & Sleep Medicine

## 2017-11-14 NOTE — Progress Notes (Signed)
   witnessed 150c of fentanyl wasted in sink by Kerin Perna RN.

## 2017-11-14 NOTE — Progress Notes (Signed)
Respiratory and CCM aware of blood gas.  Rate increased on Bipap and nebs ordered.  Repeat gas to be completed.    RN will continue to monitor patient.

## 2017-11-14 NOTE — Progress Notes (Addendum)
Pharmacy Antibiotic Note  Eric Murillo is a 57 y.o. male admitted on 11/03/2017 with sepsis.  Pharmacy has been consulted for vancomycin and zosyn dosing. WBC elevated to 24.9, Scr 3.03, afebrile. Of note, patient is on CRRT with plan to potentially switch to [facility] tomorrow.  Plan: Vancomycin 2g IV X1 as load. Follow HD schedule to determine maintenance doses  Zosyn 3.375g IV every 6 hours Monitor for Scr,  clinical resolution. F/u de-escalation plan/LOT, vancomycin trough as indicated.   Height: 5' 9.5" (176.5 cm) Weight: 298 lb 11.6 oz (135.5 kg) IBW/kg (Calculated) : 71.85  Temp (24hrs), Avg:98.5 F (36.9 C), Min:97.4 F (36.3 C), Max:100 F (37.8 C)  Recent Labs  Lab 11/10/17 0558  11/11/17 0753 11/12/17 1104 11/12/17 1929 11/12/17 1936 11/13/17 0055 11/13/17 0400 11/13/17 1640 11/14/17 0018 11/14/17 0252  WBC 13.6*  --  12.9* 11.5* 11.9*  --   --   --   --   --  24.9*  CREATININE  --    < > 3.81* 4.31* 5.66*  --  4.75* 4.16* 3.53* 3.00* 3.03*  LATICACIDVEN  --   --   --   --   --  5.7* 1.9 1.8  --   --   --    < > = values in this interval not displayed.    Estimated Creatinine Clearance: 37 mL/min (A) (by C-G formula based on SCr of 3.03 mg/dL (H)).    Allergies  Allergen Reactions  . Nsaids Other (See Comments)    Cannot take to due kidney issues  . Beta Adrenergic Blockers Other (See Comments)    "An unnamed, white-colored Beta Blocker made" him "feel funny" = made him feel cagey    Antimicrobials this admission: vancomycin 4/24 >>  zosyn 4/24 >>   Microbiology BCx 4/24>> MRSA PCR 4/18>> negative MRSA PCR 4/20>>   Thank you for allowing pharmacy to be a part of this patient's care.  Emeline General, PharmD Candidate 11/14/2017 11:12 AM

## 2017-11-14 NOTE — Progress Notes (Signed)
Patient placed on bipap per MD order due to ABG results. Patient tolerating well at this time. RN aware. RT will continue to monitor.

## 2017-11-14 NOTE — Progress Notes (Addendum)
Patient ID: Eric Murillo, male   DOB: 1961-05-14, 57 y.o.   MRN: [id number]     Advanced Heart Failure Rounding Note  PCP-Cardiologist: Loralie Champagne, MD   Subjective:    Had cardiac arrest 11/12/17 in setting of hyperK. Intubated and emergently started on CVVHD.   Awake on vent this am. Following commands. O2 40%, PEEP 8 (turned down to 5)  Remains in Afib, 70-80s.  Tolerating CRRT now pulling 150 cc an hour. Pressures in 130-140s on 4 of NE. Weight down 10 pounds.   Objective:   Weight Range: 298 lb 11.6 oz (135.5 kg) Body mass index is 43.48 kg/m.   Vital Signs:   Temp:  [98 F (36.7 C)-100 F (37.8 C)] 98 F (36.7 C) (04/24 0345) Pulse Rate:  [61-85] 85 (04/24 0835) Resp:  [0-30] 23 (04/24 0846) BP: (110-152)/(56-109) 140/79 (04/24 0835) SpO2:  [92 %-100 %] 99 % (04/24 0846) Arterial Line BP: (96-148)/(48-80) 148/80 (04/24 0846) FiO2 (%):  [40 %-50 %] 40 % (04/24 0835) Weight:  [298 lb 11.6 oz (135.5 kg)] 298 lb 11.6 oz (135.5 kg) (04/24 0605) Last BM Date: 11/03/17  Weight change: Filed Weights   11/12/17 0556 11/13/17 0600 11/14/17 0605  Weight: (!) 306 lb 4.8 oz (138.9 kg) (!) 308 lb 10.3 oz (140 kg) 298 lb 11.6 oz (135.5 kg)    Intake/Output:   Intake/Output Summary (Last 24 hours) at 11/14/2017 0902 Last data filed at 11/14/2017 0800 Gross per 24 hour  Intake 1097.69 ml  Output 5275 ml  Net -4177.31 ml      Physical Exam   CVP 17-18 General: Intubated following cpommands HEENT: + ETT anicteric Neck: Supple. JVP difficult to see. Carotids 2+ bilat; no bruits. No thyromegaly or nodule noted. Cor: PMI lateral. Irregularly irregular. Tunneled cath.  Lungs: Diminished basilar sounds Abdomen: Soft, non-tender, non-distended, no HSM. No bruits or masses. +BS  Extremities: No cyanosis, clubbing, or rash. 2-3+ edema. LFV central line. RAC AVF Neuro: Intubated. Awake on vent following commands   Telemetry   AFIB 60-70s, personally reviewed.    EKG     No new tracings.   Labs    CBC Recent Labs    11/12/17 1929 11/14/17 0018 11/14/17 0252  WBC 11.9*  --  24.9*  HGB 11.6* 13.9 11.7*  HCT 40.2 41.0 38.2*  MCV 93.3  --  85.8  PLT 156  --  716*   Basic Metabolic Panel Recent Labs    11/13/17 0400 11/13/17 1640 11/14/17 0018 11/14/17 0252  NA 136 136 138 135  K 4.6 5.3* 4.6 4.3  CL 101 100* 103 101  CO2 24 23  --  23  GLUCOSE 132* 103* 112* 110*  BUN 55* 47* 40* 41*  CREATININE 4.16* 3.53* 3.00* 3.03*  CALCIUM 8.9 8.7*  --  8.7*  MG 2.4  --   --  2.7*  PHOS 3.4  3.5 3.0  --  3.4   Liver Function Tests Recent Labs    11/12/17 1929  11/13/17 1640 11/14/17 0252  AST 77*  --   --   --   ALT 50  --   --   --   ALKPHOS 182*  --   --   --   BILITOT 2.1*  --   --   --   PROT 7.0  --   --   --   ALBUMIN 3.3*   < > 3.3* 3.2*   < > = values in this interval not  displayed.   No results for input(s): LIPASE, AMYLASE in the last 72 hours. Cardiac Enzymes No results for input(s): CKTOTAL, CKMB, CKMBINDEX, TROPONINI in the last 72 hours.  BNP: BNP (last 3 results) Recent Labs    07/26/17 0916 11/03/17 1238 11/13/17 0400  BNP 399.7* 1,160.1* 1,085.3*    ProBNP (last 3 results) No results for input(s): PROBNP in the last 8760 hours.   D-Dimer No results for input(s): DDIMER in the last 72 hours. Hemoglobin A1C No results for input(s): HGBA1C in the last 72 hours. Fasting Lipid Panel Recent Labs    11/13/17 0055  TRIG 86   Thyroid Function Tests No results for input(s): TSH, T4TOTAL, T3FREE, THYROIDAB in the last 72 hours.  Invalid input(s): FREET3  Other results:   Imaging    No results found.   Medications:     Scheduled Medications: . allopurinol  100 mg Per Tube BID  . amiodarone  200 mg Per Tube Daily  . chlorhexidine gluconate (MEDLINE KIT)  15 mL Mouth Rinse BID  . insulin aspart  1-3 Units Subcutaneous Q4H  . mouth rinse  15 mL Mouth Rinse Q2H  . sodium chloride flush  3 mL  Intravenous Q12H  . sodium polystyrene  30 g Oral Once    Infusions: . sodium chloride Stopped (11/14/17 0800)  . sodium chloride    . sodium chloride    . calcium gluconate    . famotidine (PEPCID) IV Stopped (11/13/17 1440)  . fentaNYL infusion INTRAVENOUS 50 mcg/hr (11/14/17 0830)  . ferumoxytol    . heparin 1,100 Units/hr (11/14/17 0800)  . norepinephrine (LEVOPHED) Adult infusion 2 mcg/min (11/14/17 0846)  . dialysis replacement fluid (prismasate) 300 mL/hr at 11/13/17 2033  . dialysis replacement fluid (prismasate) 300 mL/hr at 11/13/17 2033  . dialysate (PRISMASATE) 1,500 mL/hr at 11/14/17 6283  . propofol (DIPRIVAN) infusion Stopped (11/13/17 0557)  . sodium chloride      PRN Medications: sodium chloride, sodium chloride, sodium chloride, acetaminophen, alteplase, bisacodyl, docusate, fentaNYL, heparin, lidocaine (PF), lidocaine (PF), lidocaine-prilocaine, pentafluoroprop-tetrafluoroeth, sodium chloride, sodium chloride flush  Assessment/Plan   1. Cardiac arrest - Had arrest on 4/22 due to hyperkalemia with subsequent VF, - Much improved with CVVHD.  2. Acute on chronic systolic CHF: Nonischemic cardiomyopathy x years. Last echo in 11/18 with moderate LV dilation, EF 20-25%, mildly decreased RV systolic function.  He had a workup for cardiac amyloidosis in the past.  However, negative SPEP and abdominal fat pad biopsy negative. Low voltage on ECG may be due to obesity and not amyloidosis. CKD stage IV makes management of volume difficult.  - Remains markedly volume overloaded.  - Will try to take 200-250 cc/hr off with CVVHD. Tolerating very well - Continue NE for support. Titrate as needed.  - Off b-blocker with arrest - No ACEI/ARB/ARNI/spironolactone with ESRD 3. Atypical atrial flutter: Recurrent. Most recent DCCV in 11/18.  Saw Dr Curt Bears, not thought to be a good ablation candidate.  - Back in AF post code - Will hold amio for now with bradycardia - Apixiban has  been on hold for HD cath. Will continue systemic heparin.  - With ESRD will discuss with PharmD re switching apixaban to warfarin. I actually favor apixaban  4. AKI on CKD stage IV: BUN/creatinine have been rising. Vein mapping done and VVS consulted.  Nephrology appreciated.  - Tunneled HD cath and AVF placed 4/22.  - Remains on CVVHD s/p arrest.  5. OSA:  - Has been unable to tolerate  CPAP. No change.  6. Morbid obesity -Body mass index is 43.48 kg/m. 7. Hyperkalemia - Much improved with CVVHD.  8. Acute hypoxemic respiratory failure - Remains intubated.  - Volume status remains elevated, but improving. May be able to extubate today.   Length of Stay: [address]  Annamaria Helling  11/14/2017, 9:02 AM  Advanced Heart Failure Team Pager [phone] (M-F; 7a - 4p)  Please contact [facility] for night-coverage after hours (4p -7a ) and weekends on [email or web address]  Agree.   Remains intubated but awake on vent and neuro intact after arrest. On CVVHD. Weight down 10 pounds. Able to wean norepi from 8 to 4. Now pulling 150/hr. Still quite volume overloaded. Hyperkalemia resolved. Remains in AF  On exam Awake on vent + ETT JVP up to ear Cor IRR Lungs clear Ab obese NT Ext 1-2+ edema   Improving on vent and CVVHD. Still markedly volume overloaded. Will try to increase pull rate to 200-250 to facilitate extubation today. Will need to get femoral central line out soon. Back in AF. Continue heparin. Consider repeat DC-CV prior to d/c.   CRITICAL CARE Performed by: Glori Bickers  Total critical care time: 35 minutes  Critical care time was exclusive of separately billable procedures and treating other patients.  Critical care was necessary to treat or prevent imminent or life-threatening deterioration.  Critical care was time spent personally by me (independent of midlevel providers or residents) on the following activities: development of treatment plan with patient and/or  surrogate as well as nursing, discussions with consultants, evaluation of patient's response to treatment, examination of patient, obtaining history from patient or surrogate, ordering and performing treatments and interventions, ordering and review of laboratory studies, ordering and review of radiographic studies, pulse oximetry and re-evaluation of patient's condition.    Glori Bickers, MD  10:43 PM

## 2017-11-14 NOTE — Progress Notes (Signed)
150cc of fentanyl wasted in sink witnessed by Nestor Lewandowsky.

## 2017-11-14 NOTE — Progress Notes (Signed)
Pt had no urine output today. Bladder scan showed >523ml in bladder.  In/out cath performed using sterile technique per protocol.  of amber malodorus obtained.

## 2017-11-14 NOTE — Progress Notes (Signed)
ANTICOAGULATION CONSULT NOTE - Follow Up Consult  Pharmacy Consult for heparin Indication: atrial fibrillation  Labs: Recent Labs    11/11/17 0753 11/12/17 1104 11/12/17 1929  11/13/17 0400 11/13/17 1640 11/13/17 1830 11/14/17 0018 11/14/17 0252  HGB 10.6* 11.7* 11.6*  --   --   --   --  13.9  --   HCT 36.7* 39.7 40.2  --   --   --   --  41.0  --   PLT 213 176 156  --   --   --   --   --   --   APTT  --   --   --   --  62*  --   --   --   --   LABPROT  --  18.2* 19.9*  --   --   --   --   --   --   INR  --  1.53 1.70  --   --   --   --   --   --   HEPARINUNFRC  --   --   --   --   --   --  0.76*  --  0.73*  CREATININE 3.81* 4.31* 5.66*   < > 4.16* 3.53*  --  3.00* 3.03*   < > = values in this interval not displayed.    Assessment: 57yo male remains supratherapeutic on heparin with small change in heparin level after rate change.  Goal of Therapy:  Heparin level 0.3-0.7 units/ml   Plan:  Will decrease heparin gtt by 1-2 units/kg/hr to 1100 units/hr and check level in 8 hours.    Vernard Gambles, PharmD, BCPS  11/14/2017,4:53 AM

## 2017-11-15 ENCOUNTER — Inpatient Hospital Stay (HOSPITAL_COMMUNITY): Payer: Medicaid Other

## 2017-11-15 DIAGNOSIS — N186 End stage renal disease: Secondary | ICD-10-CM

## 2017-11-15 DIAGNOSIS — I5023 Acute on chronic systolic (congestive) heart failure: Secondary | ICD-10-CM

## 2017-11-15 DIAGNOSIS — Z978 Presence of other specified devices: Secondary | ICD-10-CM

## 2017-11-15 LAB — BLOOD GAS, ARTERIAL
Acid-Base Excess: 0.6 mmol/L (ref 0.0–2.0)
Acid-base deficit: 0 mmol/L (ref 0.0–2.0)
BICARBONATE: 26.3 mmol/L (ref 20.0–28.0)
Bicarbonate: 26.5 mmol/L (ref 20.0–28.0)
DELIVERY SYSTEMS: POSITIVE
DRAWN BY: 364961
Expiratory PAP: 4
FIO2: 40
Inspiratory PAP: 22
Mode: POSITIVE
O2 Content: 3 L/min
O2 Saturation: 94.4 %
O2 Saturation: 97.3 %
PATIENT TEMPERATURE: 98.6
PCO2 ART: 53.3 mmHg — AB (ref 32.0–48.0)
PCO2 ART: 64 mmHg — AB (ref 32.0–48.0)
PEEP: 4 cmH2O
PH ART: 7.309 — AB (ref 7.350–7.450)
PH ART: 7.241 — AB (ref 7.350–7.450)
PO2 ART: 108 mmHg (ref 83.0–108.0)
Patient temperature: 97.2
Pressure control: 18 cmH2O
RATE: 22 resp/min
pO2, Arterial: 76.5 mmHg — ABNORMAL LOW (ref 83.0–108.0)

## 2017-11-15 LAB — RENAL FUNCTION PANEL
Albumin: 3.7 g/dL (ref 3.5–5.0)
Anion gap: 12 (ref 5–15)
BUN: 32 mg/dL — AB (ref 6–20)
CHLORIDE: 98 mmol/L — AB (ref 101–111)
CO2: 24 mmol/L (ref 22–32)
CREATININE: 3.29 mg/dL — AB (ref 0.61–1.24)
Calcium: 8.7 mg/dL — ABNORMAL LOW (ref 8.9–10.3)
GFR calc Af Amer: 22 mL/min — ABNORMAL LOW (ref >60)
GFR calc non Af Amer: 19 mL/min — ABNORMAL LOW (ref >60)
GLUCOSE: 106 mg/dL — AB (ref 65–99)
Phosphorus: 4.7 mg/dL — ABNORMAL HIGH (ref 2.5–4.6)
Potassium: 4.9 mmol/L (ref 3.5–5.1)
Sodium: 134 mmol/L — ABNORMAL LOW (ref 135–145)

## 2017-11-15 LAB — CBC WITH DIFFERENTIAL/PLATELET
Basophils Absolute: 0 10*3/uL (ref 0.0–0.1)
Basophils Relative: 0 %
EOS ABS: 0.1 10*3/uL (ref 0.0–0.7)
Eosinophils Relative: 0 %
HEMATOCRIT: 40.3 % (ref 39.0–52.0)
HEMOGLOBIN: 11.8 g/dL — AB (ref 13.0–17.0)
LYMPHS ABS: 1 10*3/uL (ref 0.7–4.0)
Lymphocytes Relative: 5 %
MCH: 26 pg (ref 26.0–34.0)
MCHC: 29.3 g/dL — AB (ref 30.0–36.0)
MCV: 89 fL (ref 78.0–100.0)
MONO ABS: 2.4 10*3/uL — AB (ref 0.1–1.0)
MONOS PCT: 11 %
NEUTROS PCT: 84 %
Neutro Abs: 18.1 10*3/uL — ABNORMAL HIGH (ref 1.7–7.7)
Platelets: 90 10*3/uL — ABNORMAL LOW (ref 150–400)
RBC: 4.53 MIL/uL (ref 4.22–5.81)
RDW: 16.3 % — AB (ref 11.5–15.5)
WBC: 21.6 10*3/uL — ABNORMAL HIGH (ref 4.0–10.5)

## 2017-11-15 LAB — POCT I-STAT 3, ART BLOOD GAS (G3+)
ACID-BASE DEFICIT: 1 mmol/L (ref 0.0–2.0)
Bicarbonate: 26.3 mmol/L (ref 20.0–28.0)
O2 Saturation: 96 %
PH ART: 7.308 — AB (ref 7.350–7.450)
TCO2: 28 mmol/L (ref 22–32)
pCO2 arterial: 52.2 mmHg — ABNORMAL HIGH (ref 32.0–48.0)
pO2, Arterial: 87 mmHg (ref 83.0–108.0)

## 2017-11-15 LAB — COMPREHENSIVE METABOLIC PANEL
ALK PHOS: 171 U/L — AB (ref 38–126)
ALT: 148 U/L — ABNORMAL HIGH (ref 17–63)
ANION GAP: 10 (ref 5–15)
AST: 125 U/L — ABNORMAL HIGH (ref 15–41)
Albumin: 3.6 g/dL (ref 3.5–5.0)
BILIRUBIN TOTAL: 2 mg/dL — AB (ref 0.3–1.2)
BUN: 33 mg/dL — ABNORMAL HIGH (ref 6–20)
CALCIUM: 8.8 mg/dL — AB (ref 8.9–10.3)
CO2: 25 mmol/L (ref 22–32)
Chloride: 101 mmol/L (ref 101–111)
Creatinine, Ser: 3.1 mg/dL — ABNORMAL HIGH (ref 0.61–1.24)
GFR calc non Af Amer: 21 mL/min — ABNORMAL LOW (ref >60)
GFR, EST AFRICAN AMERICAN: 24 mL/min — AB (ref >60)
Glucose, Bld: 93 mg/dL (ref 65–99)
POTASSIUM: 4.5 mmol/L (ref 3.5–5.1)
SODIUM: 136 mmol/L (ref 135–145)
TOTAL PROTEIN: 8.4 g/dL — AB (ref 6.5–8.1)

## 2017-11-15 LAB — GLUCOSE, CAPILLARY
GLUCOSE-CAPILLARY: 103 mg/dL — AB (ref 65–99)
GLUCOSE-CAPILLARY: 103 mg/dL — AB (ref 65–99)
GLUCOSE-CAPILLARY: 80 mg/dL (ref 65–99)
Glucose-Capillary: 82 mg/dL (ref 65–99)
Glucose-Capillary: 118 mg/dL — ABNORMAL HIGH (ref 65–99)
Glucose-Capillary: 113 mg/dL — ABNORMAL HIGH (ref 65–99)

## 2017-11-15 LAB — HEPARIN LEVEL (UNFRACTIONATED): Heparin Unfractionated: 0.36 IU/mL (ref 0.30–0.70)

## 2017-11-15 LAB — MAGNESIUM: MAGNESIUM: 2.7 mg/dL — AB (ref 1.7–2.4)

## 2017-11-15 LAB — PHOSPHORUS: PHOSPHORUS: 5.1 mg/dL — AB (ref 2.5–4.6)

## 2017-11-15 LAB — PROTIME-INR
INR: 1.35
Prothrombin Time: 16.6 seconds — ABNORMAL HIGH (ref 11.4–15.2)

## 2017-11-15 LAB — APTT: APTT: 58 s — AB (ref 24–36)

## 2017-11-15 MED ORDER — CHLORHEXIDINE GLUCONATE 0.12 % MT SOLN
15.0000 mL | Freq: Two times a day (BID) | OROMUCOSAL | Status: DC
  Administered 2017-11-15 – 2017-11-18 (×5): 15 mL via OROMUCOSAL
  Filled 2017-11-15: qty 15

## 2017-11-15 MED ORDER — VANCOMYCIN HCL 10 G IV SOLR
1250.0000 mg | INTRAVENOUS | Status: DC
  Filled 2017-11-15: qty 1250

## 2017-11-15 MED ORDER — VANCOMYCIN HCL 10 G IV SOLR
1250.0000 mg | Freq: Once | INTRAVENOUS | Status: AC
  Administered 2017-11-15: 1250 mg via INTRAVENOUS
  Filled 2017-11-15: qty 1250

## 2017-11-15 MED ORDER — ORAL CARE MOUTH RINSE
15.0000 mL | Freq: Two times a day (BID) | OROMUCOSAL | Status: DC
  Administered 2017-11-15 – 2017-11-21 (×10): 15 mL via OROMUCOSAL

## 2017-11-15 NOTE — Progress Notes (Addendum)
Pharmacy Antibiotic Note  Eric Murillo is a 57 y.o. male admitted on 11/03/2017 with sepsis.  Pharmacy has been consulted for vancomycin and zosyn dosing. Patient is on CVVHD and will likely switch to [facility] tomorrow. CVVHD rate varying from 200-350. Will give vancomycin maintenance dose today, and adjust tomorrow if patient transitions to iHD.  Plan: Vancomycin 1250mg  IV x1- will follow up HD schedule for further maintenance doses. Continue zosyn 3.375mg  every 6 hours Monitor for Scr, c/s, clinical resolution. F/u de-escalation plan/LOT, Will get vancomycin trough tomorrow given various CVVHD rates   Height: 5' 9.5" (176.5 cm) Weight: 284 lb 13.4 oz (129.2 kg) IBW/kg (Calculated) : 71.85  Temp (24hrs), Avg:96.8 F (36 C), Min:96.1 F (35.6 C), Max:97.4 F (36.3 C)  Recent Labs  Lab 11/11/17 0753 11/12/17 1104 11/12/17 1929 11/12/17 1936 11/13/17 0055 11/13/17 0400 11/13/17 1640 11/14/17 0018 11/14/17 0252 11/14/17 1557 11/15/17 0341  WBC 12.9* 11.5* 11.9*  --   --   --   --   --  24.9*  --  21.6*  CREATININE 3.81* 4.31* 5.66*  --  4.75* 4.16* 3.53* 3.00* 3.03* 2.94* 3.10*  LATICACIDVEN  --   --   --  5.7* 1.9 1.8  --   --   --   --   --     Estimated Creatinine Clearance: 35.3 mL/min (A) (by C-G formula based on SCr of 3.1 mg/dL (H)).    Allergies  Allergen Reactions  . Nsaids Other (See Comments)    Cannot take to due kidney issues  . Beta Adrenergic Blockers Other (See Comments)    "An unnamed, white-colored Beta Blocker made" him "feel funny" = made him feel cagey    Antimicrobials this admission: vanco 4/24 >>  zosyn 4/24 >>   Microbiology results: 4/24 BCx: NGTD 4/18 MRSA PCR: NEGATIVE  Thank you for allowing pharmacy to be a part of this patient's care.  Emeline General, PharmD Candidate 11/15/2017 8:14 AM

## 2017-11-15 NOTE — Progress Notes (Signed)
Patient refusing BiPap at night. This RN removed BiPap and placed patient on 3L [place].RT and ELink, RN [employer]) informed. Patient oriented and oxygen saturation is 98% at this time. Will continue to monitor patient.

## 2017-11-15 NOTE — Evaluation (Signed)
Physical Therapy Re-Evaluation Patient Details Name: Eric Murillo MRN: [id number] DOB: 19-Sep-1960 Today's Date: 11/15/2017   History of Present Illness  Pt is a 57 y.o. male admitted 11/03/17 with cough and SOB, found to be volume overloaded and in A-flutter with controlled ventricular rate; worked up for HF and a-flutter. On 4/22, had wide-complex tacharrhythmia prompting ACLS, intubation and transfer to ICU; emergently started on CVVHD. Permanent RIJ HD cath and AVG placed 4/22. Extubated 4/24.  Remains on CRRT as of 4/25. PMH includes AICD placement, OA, CHF, CKD, gout, HTN, morbid obesity, h/o cardiac cath, cardioversion, multiple TEE.     Clinical Impression  Pt seen for re-evaluation post-change in medical status requiring intubation. Pt now extubated, remains on CRRT. Able to stand and take steps to recliner with min guard, requiring HHA to maintain balance. No c/o pain or lightheadedness; VSS. Overall, pt moving well. Will plan to progress ambulation once off CRRT. Will follow acutely.    Follow Up Recommendations Home health PT;Supervision - Intermittent    Equipment Recommendations  (TBD)    Recommendations for Other Services       Precautions / Restrictions Precautions Precautions: Fall Precaution Comments: CRRT, art line Restrictions Weight Bearing Restrictions: No      Mobility  Bed Mobility Overal bed mobility: Needs Assistance Bed Mobility: Supine to Sit     Supine to sit: Min guard;HOB elevated     General bed mobility comments: Min guard for safety with lines/CRRT, but no physical assist required  Transfers Overall transfer level: Needs assistance Equipment used: None Transfers: Sit to/from Stand Sit to Stand: Min guard         General transfer comment: Min guard for balance  Ambulation/Gait Ambulation/Gait assistance: Min guard Ambulation Distance (Feet): 2 Feet Assistive device: None;1 person hand held assist Gait Pattern/deviations: Step-to  pattern Gait velocity: Decreased   General Gait Details: Mobility limited secondary to CRRT. Pt able to take steps from bed to recliner with min guard, requiring HHA to prevent 1x LOB. Cues for safety as pt attempting to walk away from CRRT despite education  Stairs            Wheelchair Mobility    Modified Rankin (Stroke Patients Only)       Balance Overall balance assessment: Needs assistance Sitting-balance support: Feet supported Sitting balance-Leahy Scale: Fair     Standing balance support: No upper extremity supported;During functional activity Standing balance-Leahy Scale: Fair Standing balance comment: Can static stand with no UE support; required HHA to maintain dynamic balance                             Pertinent Vitals/Pain Pain Assessment: No/denies pain    Home Living Family/patient expects to be discharged to:: Private residence Living Arrangements: Parent Available Help at Discharge: Family;Available 24 hours/day Type of Home: House Home Access: Level entry     Home Layout: One level Home Equipment: Bedside commode Additional Comments: Lives with parent. Other family available to provide PRN assist if needed    Prior Function Level of Independence: Independent               Hand Dominance   Dominant Hand: Right    Extremity/Trunk Assessment   Upper Extremity Assessment Upper Extremity Assessment: Overall WFL for tasks assessed    Lower Extremity Assessment Lower Extremity Assessment: Overall WFL for tasks assessed    Cervical / Trunk Assessment Cervical / Trunk Assessment: Normal  Communication   Communication: No difficulties  Cognition Arousal/Alertness: Awake/alert Behavior During Therapy: Flat affect Overall Cognitive Status: No family/caregiver present to determine baseline cognitive functioning Area of Impairment: Safety/judgement                         Safety/Judgement: Decreased awareness of  safety;Decreased awareness of deficits            General Comments General comments (skin integrity, edema, etc.): VSS on O2 [place]. RN present throughout treatment    Exercises     Assessment/Plan    PT Assessment Patient needs continued PT services  PT Problem List Decreased mobility;Obesity;Decreased activity tolerance;Decreased balance;Decreased safety awareness       PT Treatment Interventions DME instruction;Therapeutic activities;Gait training;Therapeutic exercise;Patient/family education;Stair training;Balance training;Functional mobility training    PT Goals (Current goals can be found in the Care Plan section)  Acute Rehab PT Goals Patient Stated Goal: Return home and lose weight PT Goal Formulation: With patient Time For Goal Achievement: 11/29/17 Potential to Achieve Goals: Good    Frequency Min 3X/week   Barriers to discharge Decreased caregiver support      Co-evaluation               AM-PAC PT "6 Clicks" Daily Activity  Outcome Measure Difficulty turning over in bed (including adjusting bedclothes, sheets and blankets)?: A Little Difficulty moving from lying on back to sitting on the side of the bed? : A Little Difficulty sitting down on and standing up from a chair with arms (e.g., wheelchair, bedside commode, etc,.)?: A Little Help needed moving to and from a bed to chair (including a wheelchair)?: A Little Help needed walking in hospital room?: A Little Help needed climbing 3-5 steps with a railing? : A Lot 6 Click Score: 17    End of Session Equipment Utilized During Treatment: Gait belt Activity Tolerance: Patient tolerated treatment well Patient left: in chair;with call bell/phone within reach;with family/visitor present Nurse Communication: Mobility status PT Visit Diagnosis: Unsteadiness on feet (R26.81);Muscle weakness (generalized) (M62.81);Difficulty in walking, not elsewhere classified (R26.2)    Time: 4656-8127 PT Time Calculation  (min) (ACUTE ONLY): 28 min   Charges:   PT Evaluation $PT Re-evaluation: 1 Re-eval PT Treatments $Therapeutic Activity: 8-22 mins   PT G Codes:       Ina Homes, PT, DPT Acute Rehab Services  Pager: [phone]  Malachy Chamber 11/15/2017, 3:12 PM

## 2017-11-15 NOTE — Progress Notes (Signed)
ANTICOAGULATION CONSULT NOTE - Follow Up Consult  Pharmacy Consult for heparin Indication: atrial fibrillation  Allergies  Allergen Reactions  . Nsaids Other (See Comments)    Cannot take to due kidney issues  . Beta Adrenergic Blockers Other (See Comments)    "An unnamed, white-colored Beta Blocker made" him "feel funny" = made him feel cagey    Patient Measurements: Height: 5' 9.5" (176.5 cm) Weight: 284 lb 13.4 oz (129.2 kg) IBW/kg (Calculated) : 71.85 Heparin Dosing Weight: 104.9 kg  Vital Signs: Temp: 96.4 F (35.8 C) (04/25 0400) Temp Source: Axillary (04/25 0400) BP: 124/63 (04/25 0700) Pulse Rate: 69 (04/25 0345)  Labs: Recent Labs    11/12/17 1104 11/12/17 1929  11/13/17 0400  11/14/17 0018 11/14/17 0252 11/14/17 1300 11/14/17 1557 11/15/17 0341  HGB 11.7* 11.6*  --   --   --  13.9 11.7*  --   --  11.8*  HCT 39.7 40.2  --   --   --  41.0 38.2*  --   --  40.3  PLT 176 156  --   --   --   --  125*  --   --  90*  APTT  --   --   --  62*  --   --  33  --   --  58*  LABPROT 18.2* 19.9*  --   --   --   --   --   --   --  16.6*  INR 1.53 1.70  --   --   --   --   --   --   --  1.35  HEPARINUNFRC  --   --   --   --    < >  --  0.73* 0.48  --  0.36  CREATININE 4.31* 5.66*   < > 4.16*   < > 3.00* 3.03*  --  2.94* 3.10*   < > = values in this interval not displayed.    Estimated Creatinine Clearance: 35.3 mL/min (A) (by C-G formula based on SCr of 3.1 mg/dL (H)).  Assessment: 57 YO male admitted with a flutter and s/p cardiac arrest on 4/22 due to hyperkalemia. Was on apixaban 5mg  BID PTA for Afib, but switched to heparin for cath placement for CVVHD. Plan to restart apixaban at some point.   Heparin therapeutic at 0.36. Hgb 11.8, Plt 90. No bleeding reported.   Goal of Therapy:  Heparin level 0.3-0.7 units/ml Monitor platelets by anticoagulation protocol: Yes   Plan:  Continue heparin at 1100 units/hr Continue to monitor H&H and platelets  Daily heparin  level Monitor s/sx of bleeding  Emeline General, PharmD Candidate 11/15/2017,7:25 AM

## 2017-11-15 NOTE — Progress Notes (Signed)
Kathrine Cords, MD paged regarding patient complaining of hand cramps. Verbal order to check K+ was given.

## 2017-11-15 NOTE — Progress Notes (Signed)
ABG reported to Deterding, MD. No new orders given at this time. Patient updated and agreeable to wear BiPap at night.

## 2017-11-15 NOTE — Evaluation (Signed)
Clinical/Bedside Swallow Evaluation Patient Details  Name: Eric Murillo MRN: [id number] Date of Birth: 02-07-1961  Today's Date: 11/15/2017 Time: SLP Start Time (ACUTE ONLY): 1051 SLP Stop Time (ACUTE ONLY): 1105 SLP Time Calculation (min) (ACUTE ONLY): 14 min  Past Medical History:  Past Medical History:  Diagnosis Date  . AICD (automatic cardioverter/defibrillator) present   . Anemia of chronic disease   . Arthritis   . Atrial flutter (HCC)    DCCV 9/15  . Chest pain on exertion   . Chronic systolic congestive heart failure, NYHA class 2 (HCC)    EF 20-25% 1/16  . CKD (chronic kidney disease) stage 3, GFR 30-59 ml/min (HCC)   . Dyslipidemia   . Gout    Of big toe  . Hyperglycemia   . Hyperlipidemia   . Hypertension   . Morbid obesity with BMI of 45.0-49.9, adult (HCC)   . Non-ischemic cardiomyopathy (HCC)   . Organic erectile dysfunction   . OSA (obstructive sleep apnea)   . Pilonidal cyst   . Submandibular sialolithiasis    Right   Past Surgical History:  Past Surgical History:  Procedure Laterality Date  . AV FISTULA PLACEMENT Right 11/12/2017   Procedure: CREATION of RIGHT BRACHIAL CEPHALIC ARTERIOVENOUS (AV) FISTULA;  Surgeon: Sherren Kerns, MD;  [facility] OR;  Service: Vascular;  Laterality: Right;  . CARDIAC CATHETERIZATION  2012  . CARDIOVERSION N/A 04/03/2014   Procedure: CARDIOVERSION;  Surgeon: Laurey Morale, MD;  Location: [facility] ENDOSCOPY;  Service: Cardiovascular;  Laterality: N/A;  . CARDIOVERSION N/A 03/09/2016   Procedure: CARDIOVERSION;  Surgeon: Laurey Morale, MD;  Location: [facility] ENDOSCOPY;  Service: Cardiovascular;  Laterality: N/A;  . CARDIOVERSION N/A 06/06/2017   Procedure: CARDIOVERSION;  Surgeon: Laurey Morale, MD;  Location: [facility] ENDOSCOPY;  Service: Cardiovascular;  Laterality: N/A;  . COLONOSCOPY    . EP IMPLANTABLE DEVICE N/A 05/21/2015   Procedure: ICD Implant;  Surgeon: Duke Salvia, MD;  Location: [facility] INVASIVE CV LAB;  Service:  Cardiovascular;  Laterality: N/A;  . INSERTION OF DIALYSIS CATHETER Right 11/12/2017   Procedure: PLACEMENT of right internal jugualr tunneled DIALYSIS CATHETER with removal or temporary dialysis catheter;  Surgeon: Sherren Kerns, MD;  Location: [facility] OR;  Service: Vascular;  Laterality: Right;  . IR FLUORO GUIDE CV LINE RIGHT  11/08/2017  . IR US GUIDE VASC ACCESS RIGHT  11/08/2017  . LEFT AND RIGHT HEART CATHETERIZATION WITH CORONARY ANGIOGRAM N/A 04/24/2014   Procedure: LEFT AND RIGHT HEART CATHETERIZATION WITH CORONARY ANGIOGRAM;  Surgeon: Laurey Morale, MD;  Location: [facility] CATH LAB;  Service: Cardiovascular;  Laterality: N/A;  . SALIVARY GLAND SURGERY  09/12/2012  . SUBMANDIBULAR GLAND EXCISION Right 09/12/2012   Procedure: Removal Right Submandibular Larina Bras;  Surgeon: Serena Colonel, MD;  Location: [facility] OR;  Service: ENT;  Laterality: Right;  . TEE WITHOUT CARDIOVERSION N/A 04/03/2014   Procedure: TRANSESOPHAGEAL ECHOCARDIOGRAM (TEE);  Surgeon: Laurey Morale, MD;  Location: [facility] ENDOSCOPY;  Service: Cardiovascular;  Laterality: N/A;  . TEE WITHOUT CARDIOVERSION N/A 03/09/2016   Procedure: TRANSESOPHAGEAL ECHOCARDIOGRAM (TEE);  Surgeon: Laurey Morale, MD;  Location: [facility] ENDOSCOPY;  Service: Cardiovascular;  Laterality: N/A;  . TEE WITHOUT CARDIOVERSION N/A 06/06/2017   Procedure: TRANSESOPHAGEAL ECHOCARDIOGRAM (TEE);  Surgeon: Laurey Morale, MD;  Location: [facility] ENDOSCOPY;  Service: Cardiovascular;  Laterality: N/A;   HPI:  Pt is a57 y.o.yo malewith chronic kidney disease stage IV, admitted with congestive heart failure exacerbation and worsening renal failure. Intubated 4/22-4/24. PMHx includes  HTN, obstructive sleep apnea, CHF, and CKD. Not before seen by SLP. CXR (4/24) revealed mild bibasilar atelectasis/infiltrates, no interim change, and no definite effusions.   Assessment / Plan / Recommendation Clinical Impression   Pt presents with mildly hoarse vocal quality, congested cough although  seemingly functional, and functional swallow ability, as indicated by no overt signs/symptoms concerning for aspiration and adequate oral manipulation/clearance for all PO trials despite challenging. Given observed swallowing ability recommend pt to continue with regular solids and thin liquid diet with aspiration precautions (small bites, slow rate of intake, and sitting upright) with intermittent supervision and pills administered whole via thin liquid. Given mildly reduced voice and cough quality, SLP will follow briefly to ensure safety and appropriateness of recommended diet.   SLP Visit Diagnosis: Dysphagia, unspecified (R13.10)    Aspiration Risk  Mild aspiration risk    Diet Recommendation Regular;Thin liquid   Liquid Administration via: Cup;Straw Medication Administration: Whole meds with liquid Supervision: Patient able to self feed;Intermittent supervision to cue for compensatory strategies Compensations: Slow rate;Small sips/bites Postural Changes: Seated upright at 90 degrees    Other  Recommendations Oral Care Recommendations: Oral care BID   Follow up Recommendations None      Frequency and Duration min 1 x/week  1 week       Prognosis Prognosis for Safe Diet Advancement: Good      Swallow Study   General HPI: Pt is a57 y.o.yo malewith chronic kidney disease stage IV, admitted with congestive heart failure exacerbation and worsening renal failure. Intubated 4/22-4/24. PMHx includes HTN, obstructive sleep apnea, CHF, and CKD. Not before seen by SLP. CXR (4/24) revealed mild bibasilar atelectasis/infiltrates, no interim change, and no definite effusions. Type of Study: Bedside Swallow Evaluation Previous Swallow Assessment: none in chart Diet Prior to this Study: Regular;Thin liquids Temperature Spikes Noted: No Respiratory Status: Nasal cannula History of Recent Intubation: Yes Length of Intubations (days): 3 days Date extubated: 11/14/17 Behavior/Cognition:  Alert;Cooperative;Pleasant mood Oral Cavity Assessment: Within Functional Limits Oral Care Completed by SLP: Yes Oral Cavity - Dentition: Adequate natural dentition Vision: Functional for self-feeding Self-Feeding Abilities: Able to feed self Patient Positioning: Upright in bed Baseline Vocal Quality: Hoarse(mild hoarse) Volitional Cough: Congested Volitional Swallow: Able to elicit    Oral/Motor/Sensory Function Overall Oral Motor/Sensory Function: Within functional limits   Ice Chips Ice chips: Within functional limits Presentation: Spoon   Thin Liquid Thin Liquid: Within functional limits Presentation: Cup;Straw    Nectar Thick Nectar Thick Liquid: Not tested   Honey Thick Honey Thick Liquid: Not tested   Puree Puree: Within functional limits Presentation: Spoon;Self Fed   Solid   GO   Solid: Within functional limits Presentation: Self Fed        [place] Rashidah [place] SLP Student Clinician  [place] Kou Gucciardo 11/15/2017,12:09 PM

## 2017-11-15 NOTE — Progress Notes (Signed)
eLink Physician-Brief Progress Note Patient Name: Eric Murillo DOB: Apr 14, 1961 MRN: [id number]   Date of Service  11/15/2017  HPI/Events of Note  Repeat abg continues to show mild hypercapnic resp acidosis   eICU Interventions  reviewed, patient awake and talking comfortably with his wife on bipap, continue bipap. He is undergoing dialysis.     Intervention Category Major Interventions: Acid-Base disturbance - evaluation and management  [name] 11/15/2017, 12:50 AM

## 2017-11-15 NOTE — Progress Notes (Addendum)
Patient ID: Eric Murillo, male   DOB: Jan 25, 1961, 57 y.o.   MRN: [id number]     Advanced Heart Failure Rounding Note  PCP-Cardiologist: [name], MD   Subjective:    Had cardiac arrest 11/12/17 in setting of hyperK. Intubated and emergently started on CVVHD.   Extubated 11/14/17.  Running on CVVHD > 300 cc/hr overnight. Weight down 14 lbs.   Feeling much better this am. Awake, off vent. Didn't remember much of what happened, but family filled him in. No orthopnea or PND. Still in AF.   Objective:   Weight Range: 284 lb 13.4 oz (129.2 kg) Body mass index is 41.46 kg/m.   Vital Signs:   Temp:  [96.1 F (35.6 C)-97.8 F (36.6 C)] 97.8 F (36.6 C) (04/25 0800) Pulse Rate:  [69-86] 69 (04/25 0345) Resp:  [10-27] 15 (04/25 0800) BP: (88-162)/(45-103) 135/70 (04/25 0800) SpO2:  [86 %-100 %] 100 % (04/25 0800) Arterial Line BP: (89-148)/(46-80) 116/62 (04/25 0800) FiO2 (%):  [40 %] 40 % (04/25 0400) Weight:  [284 lb 13.4 oz (129.2 kg)] 284 lb 13.4 oz (129.2 kg) (04/25 0630) Last BM Date: 11/03/17  Weight change: Filed Weights   11/13/17 0600 11/14/17 0605 11/15/17 0630  Weight: (!) 308 lb 10.3 oz (140 kg) 298 lb 11.6 oz (135.5 kg) 284 lb 13.4 oz (129.2 kg)    Intake/Output:   Intake/Output Summary (Last 24 hours) at 11/15/2017 0834 Last data filed at 11/15/2017 0800 Gross per 24 hour  Intake 1224.86 ml  Output 8583 ml  Net -7358.14 ml      Physical Exam   General: Sitting up in bed. On CVVHD. NAD HEENT: Normal Neck: Supple. JVP difficult to see. Carotids 2+ bilat; no bruits. No thyromegaly or nodule noted. Cor: PMI lateral. Irregular irregular. Tunneled cath.  Lungs: CTAB, normal effort. No wheeze Abdomen: obese Soft, non-tender, non-distended, no HSM. No bruits or masses. +BS  Extremities: No cyanosis, clubbing, or rash. 2+ edema. LFV central line. RAC AVF.  Neuro: alert & oriented x 3, cranial nerves grossly intact. moves all 4 extremities w/o difficulty.  Affect pleasant  Telemetry   Afib 60-70s, personally reviewed.   EKG   No new tracings.   Labs    CBC Recent Labs    11/14/17 0252 11/15/17 0341  WBC 24.9* 21.6*  NEUTROABS  --  18.1*  HGB 11.7* 11.8*  HCT 38.2* 40.3  MCV 85.8 89.0  PLT 125* 90*   Basic Metabolic Panel Recent Labs    82/95/62 0252 11/14/17 1557 11/15/17 0341  NA 135 136 136  K 4.3 4.6 4.5  CL 101 101 101  CO2 23 25 25   GLUCOSE 110* 97 93  BUN 41* 35* 33*  CREATININE 3.03* 2.94* 3.10*  CALCIUM 8.7* 8.7* 8.8*  MG 2.7*  --  2.7*  PHOS 3.4 5.1* 5.1*   Liver Function Tests Recent Labs    11/12/17 1929  11/14/17 1557 11/15/17 0341  AST 77*  --   --  125*  ALT 50  --   --  148*  ALKPHOS 182*  --   --  171*  BILITOT 2.1*  --   --  2.0*  PROT 7.0  --   --  8.4*  ALBUMIN 3.3*   < > 3.5 3.6   < > = values in this interval not displayed.   No results for input(s): LIPASE, AMYLASE in the last 72 hours. Cardiac Enzymes No results for input(s): CKTOTAL, CKMB, CKMBINDEX, TROPONINI in the last  72 hours.  BNP: BNP (last 3 results) Recent Labs    07/26/17 0916 11/03/17 1238 11/13/17 0400  BNP 399.7* 1,160.1* 1,085.3*    ProBNP (last 3 results) No results for input(s): PROBNP in the last 8760 hours.   D-Dimer No results for input(s): DDIMER in the last 72 hours. Hemoglobin A1C No results for input(s): HGBA1C in the last 72 hours. Fasting Lipid Panel Recent Labs    11/13/17 0055  TRIG 86   Thyroid Function Tests No results for input(s): TSH, T4TOTAL, T3FREE, THYROIDAB in the last 72 hours.  Invalid input(s): FREET3  Other results:   Imaging    No results found.   Medications:     Scheduled Medications: . allopurinol  100 mg Per Tube BID  . amiodarone  200 mg Per Tube Daily  . chlorhexidine  15 mL Mouth Rinse BID  . Chlorhexidine Gluconate Cloth  6 each Topical Daily  . insulin aspart  1-3 Units Subcutaneous Q4H  . ipratropium-albuterol  3 mL Nebulization Q6H  .  mouth rinse  15 mL Mouth Rinse q12n4p  . sodium chloride flush  3 mL Intravenous Q12H  . sodium polystyrene  30 g Oral Once    Infusions: . sodium chloride Stopped (11/14/17 0800)  . sodium chloride    . sodium chloride    . calcium gluconate    . famotidine (PEPCID) IV Stopped (11/14/17 1330)  . fentaNYL infusion INTRAVENOUS Stopped (11/14/17 0830)  . ferumoxytol    . heparin 1,100 Units/hr (11/15/17 0505)  . norepinephrine (LEVOPHED) Adult infusion Stopped (11/14/17 0900)  . piperacillin-tazobactam Stopped (11/15/17 0535)  . dialysis replacement fluid (prismasate) 300 mL/hr at 11/15/17 0559  . dialysis replacement fluid (prismasate) 300 mL/hr at 11/15/17 0559  . dialysate (PRISMASATE) 1,500 mL/hr at 11/15/17 0832  . propofol (DIPRIVAN) infusion Stopped (11/13/17 0557)  . sodium chloride      PRN Medications: sodium chloride, sodium chloride, sodium chloride, acetaminophen, albuterol, alteplase, bisacodyl, docusate, fentaNYL, heparin, lidocaine (PF), lidocaine (PF), lidocaine-prilocaine, pentafluoroprop-tetrafluoroeth, sodium chloride, sodium chloride flush  Assessment/Plan   1. Cardiac arrest - Had arrest on 4/22 due to hyperkalemia with subsequent VF, - Much improved with CVVHD.  2. Acute on chronic systolic CHF: Nonischemic cardiomyopathy x years. Last echo in 11/18 with moderate LV dilation, EF 20-25%, mildly decreased RV systolic function.  He had a workup for cardiac amyloidosis in the past.  However, negative SPEP and abdominal fat pad biopsy negative. Low voltage on ECG may be due to obesity and not amyloidosis. CKD stage IV makes management of volume difficult.  - Volume overload improved, but remains up.  - Continue CVVHD for at least today. Dr. Gala Romney discussed with nephrology personally.  - Continue NE for support. Titrate as needed.  - Off b-blocker with arrest - No ACEI/ARB/ARNI/spironolactone with ESRD 3. Atypical atrial flutter: Recurrent. Most recent DCCV in  11/18.  Saw Dr Elberta Fortis, not thought to be a good ablation candidate.  - Back in AF post code - Back on po amiodarone.  - Apixiban has been on hold for HD cath. Will continue systemic heparin.  - With ESRD will discuss with PharmD re switching apixaban to warfarin. Favor apixaban  4. AKI on CKD stage IV: Vein mapping done and VVS consulted.  Nephrology appreciated.  - Tunneled HD cath and AVF placed 4/22.  - Remains on CVVHD s/p arrest. Would continue for today.  5. OSA:  - Has been unable to tolerate CPAP. No change.   6. Morbid obesity -  Body mass index is 41.46 kg/m. 7. Hyperkalemia - Managed with CVVHD.  8. Acute hypoxemic respiratory failure - Extubated 11/14/17   Length of Stay: [address]  Graciella Freer, [place]  11/15/2017, 8:34 AM  Advanced Heart Failure Team Pager [phone] (M-F; 7a - 4p)  Please contact [facility] for night-coverage after hours (4p -7a ) and weekends on [email or web address]  Remains on CVVHD. Volume status improving rapidly but still overloaded. Now off norepi while awake. (BP falls when asleep). Discussed with Nephrology, will continue to pull for 24 more hours. Remains in AF. Will continue po amio. Continue heparin. Can switch to apixaban soon. Can pull central line. Leave arterial line one more day.   Arvilla Meres, MD  9:42 PM

## 2017-11-15 NOTE — Progress Notes (Signed)
Pulmonary/Critical Care Progress Note  Patient Name: Eric Murillo MRN: [id number] DOB: April 02, 1961    ADMISSION DATE:  11/03/2017 CONSULTATION DATE:  11/12/2017  REFERRING MD:  Dr. Jearld Pies  REASON FOR CONSULTATION: Cardiac arrest   ASSESSMENT/PLAN:  ASSESSMENT (included in the Hospital Problem List)   PULMONARY  Acute hypoxemic respiratory failure  Obstructive sleep apnea, untreated - Doing well - Aspiration precaution - BD - BIPAP at night and PRN - RPT abg on [place]  - Overall doing well   CARDIOVASCULAR  Acute on chronic systolic heart failure  Atrial fibrillation, rate controlled  Wide-complex tachyarrhythmia/cardiac arrest/asystole/ventricular fibrillation Management per advanced heart failure service. Off levophed and doing well Ok to start betablocker from respiratory point of view  ID: ? Aspiration on left WBC is 22 today Blood culture negative so far Will add vanco and zosy started on 4/24  Adjust with culture Has multiple lines and catheters. DC a Line as soon as possible once ok by cardiology DC femoral if able to get peripheral lines   RENAL  End-stage renal disease  Hyperkalemia  Metabolic acidosis CRRT per Nephrology  GASTROINTESTINAL NUTRITION: Started on diet doing well  HEMATOLOGIC  Anemia Eliquis to be restarted at the discretion of the primary team  ENDOCRINE  Hyperglycemia Monitor fingerstick glucose. Sliding scale insulin.  NEUROLOGIC  Acute metabolic encephalopathy, improved   Skin/Wound: chronic changes   Electrolytes: Replace electrolytes per ICU electrolyte replacement protocol.   IVF: none  Nutrition: Cardiac diet  Prophylaxis: DVT Prophylaxis with heparin,. GI Prophylaxis.   Restraints: None  PT/OT eval and treat. OOB when appropriate.   Lines/Tubes:  No  foley  Left femoral 4/22 central line.  ADVANCE DIRECTIVE:Full code  FAMILY DISCUSSION:Spoke with family at bedside  Quality Care: PPI, DVT  prophylaxis, HOB elevated, Infection control all reviewed and addressed.  Events and notes from last 24 hours reviewed. Care plan discussed on multidisciplinary rounds  CC TIME:42 min  Overall doing well post extubation but as still in ICU and has issues with hypercarbia and infection   Old records reviewed discussed results and management plan with patient  Images personally reviewed and results and labs reviewed and discussed with patient.  All medication reviewed and adjusted  Further management depending on test results and work up as outlined above.      SUBJECTIVE:   -Interim events: Patient was extubated on 4/24 subsequent to that some hyper carbia and managed on BIPA doing well on oxygen [place] and awake and very coherant without any issues     HISTORY OF PRESENT ILLNESS This 57 year old African-American male presented with flulike symptoms 4/13 and was found to be in atrial fibrillation with profound volume overload.  He has CKD stage IV at baseline but presented with acute worsening.  Attempts at diuretic therapy failed.  HD catheter was placed for initiation of HD on 419.  He underwent right upper extremity fistula placement on 4/22.  Postoperatively, he developed worsening agitation and hypoxia.  He subsequently developed a wide-complex tachycardia, prompting approximately defibrillation.  He was found to be hyperkalemic.  He was intubated and transferred to [facility].  CCM was consulted for ventilator management.   Current Facility-Administered Medications:  .  0.9 %  sodium chloride infusion, 250 mL, Intravenous, PRN, Rhyne, [place] Coupe, PA-C, Stopped at 11/14/17 0800 .  0.9 %  sodium chloride infusion, 100 mL, Intravenous, PRN, Rhyne, Samantha J, PA-C .  0.9 %  sodium chloride infusion, 100 mL, Intravenous, PRN, Rhyne, Samantha J, PA-C .  acetaminophen (TYLENOL) solution 650 mg, 650 mg, Per Tube, Q4H PRN, Laurey Morale, MD .  albuterol (PROVENTIL) (2.5 MG/3ML) 0.083%  nebulizer solution 2.5 mg, 2.5 mg, Nebulization, Q3H PRN, Duayne Cal, NP .  allopurinol (ZYLOPRIM) tablet 100 mg, 100 mg, Per Tube, BID, Laurey Morale, MD, 100 mg at 11/15/17 1057 .  alteplase (CATHFLO ACTIVASE) injection 2 mg, 2 mg, Intracatheter, Once PRN, Rhyne, Samantha J, PA-C .  amiodarone (PACERONE) tablet 200 mg, 200 mg, Per Tube, Daily, Laurey Morale, MD, 200 mg at 11/15/17 1058 .  bisacodyl (DULCOLAX) suppository 10 mg, 10 mg, Rectal, Daily PRN, Laurey Morale, MD .  calcium gluconate 1 g in sodium chloride 0.9 % 100 mL IVPB, 1 g, Intravenous, Once, Laurey Morale, MD .  chlorhexidine (PERIDEX) 0.12 % solution 15 mL, 15 mL, Mouth Rinse, BID, Laurey Morale, MD, 15 mL at 11/15/17 1059 .  Chlorhexidine Gluconate Cloth 2 % PADS 6 each, 6 each, Topical, Daily, Laurey Morale, MD, 6 each at 11/15/17 1059 .  docusate (COLACE) 50 MG/5ML liquid 100 mg, 100 mg, Per Tube, BID PRN, Laurey Morale, MD .  famotidine (PEPCID) IVPB 20 mg premix, 20 mg, Intravenous, Q24H, Graciella Freer, PA-C, Stopped at 11/14/17 1330 .  fentaNYL (SUBLIMAZE) bolus via infusion 25 mcg, 25 mcg, Intravenous, Q1H PRN, Laurey Morale, MD .  ferumoxytol [facility]) 510 mg in sodium chloride 0.9 % 100 mL IVPB, 510 mg, Intravenous, Once, Maxie Barb, MD .  heparin ADULT infusion 100 units/mL (25000 units/281mL sodium chloride 0.45%), 1,100 Units/hr, Intravenous, Continuous, Juliette Mangle, RPH, Last Rate: 11 mL/hr at 11/15/17 0505, 1,100 Units/hr at 11/15/17 0505 .  heparin injection 1,000 Units, 1,000 Units, Dialysis, PRN, Rhyne, Samantha J, PA-C .  insulin aspart (novoLOG) injection 1-3 Units, 1-3 Units, Subcutaneous, Q4H, Duayne Cal, NP, 1 Units at 11/13/17 0450 .  ipratropium-albuterol (DUONEB) 0.5-2.5 (3) MG/3ML nebulizer solution 3 mL, 3 mL, Nebulization, Q6H, Duayne Cal, NP, 3 mL at 11/15/17 0859 .  lidocaine (PF) (XYLOCAINE) 1 % injection 5 mL, 5 mL, Intradermal, PRN,  Rhyne, Samantha J, PA-C .  lidocaine (PF) (XYLOCAINE) 1 % injection, , , PRN, Malachy Moan, MD, 10 mL at 11/08/17 1813 .  lidocaine-prilocaine (EMLA) cream 1 application, 1 application, Topical, PRN, Rhyne, Samantha J, PA-C .  MEDLINE mouth rinse, 15 mL, Mouth Rinse, q12n4p, McLean, Eliot Ford, MD .  pentafluoroprop-tetrafluoroeth (GEBAUERS) aerosol 1 application, 1 application, Topical, PRN, Rhyne, Samantha J, PA-C .  piperacillin-tazobactam (ZOSYN) IVPB 3.375 g, 3.375 g, Intravenous, Q6H, Roseanne Reno, MD, Stopped at 11/15/17 0535 .  prismasol BGK 2/2.5 5,000 mL dialysis replacement fluid, , CRRT, Continuous, Ethelene Hal, MD, Last Rate: 300 mL/hr at 11/15/17 0559 .  prismasol BGK 2/2.5 5,000 mL dialysis replacement fluid, , CRRT, Continuous, Ethelene Hal, MD, Last Rate: 300 mL/hr at 11/15/17 0559 .  prismasol BGK 2/2.5 5,000 mL dialysis solution, , CRRT, Continuous, Ethelene Hal, MD, Last Rate: 1,500 mL/hr at 11/15/17 [phone] .  sodium chloride 0.9 % primer fluid for CRRT, , CRRT, PRN, Annie Sable, MD .  sodium chloride flush (NS) 0.9 % injection 3 mL, 3 mL, Intravenous, Q12H, Rhyne, Samantha J, PA-C, 10 mL at 11/14/17 2000 .  sodium chloride flush (NS) 0.9 % injection 3 mL, 3 mL, Intravenous, PRN, Rhyne, Samantha J, PA-C, 3 mL at 11/03/17 2146   OBJECTIVE:   VITAL SIGNS: BP (!) 146/68   Pulse 96  Temp 97.8 F (36.6 C) (Oral)   Resp 20   Ht 5' 9.5" (1.765 m)   Wt 129.2 kg (284 lb 13.4 oz)   SpO2 99%   BMI 41.46 kg/m  Vitals:   11/15/17 0800 11/15/17 0858 11/15/17 0900 11/15/17 1000  BP: 135/70 135/70 (!) 151/92 (!) 146/68  Pulse:  96    Resp: 15 19 14 20   Temp: 97.8 F (36.6 C)     TempSrc: Oral     SpO2: 100% 100% 100% 99%  Weight:      Height:        HEMODYNAMICS: CVP:  [0 mmHg-19 mmHg] 0 mmHg  VENTILATOR SETTINGS: Vent Mode: BIPAP;PCV FiO2 (%):  [32 %-40 %] 32 % Set Rate:  [8 bmp-22 bmp] 22 bmp PEEP:  [4 cmH20-5 cmH20] 4 cmH20 Plateau Pressure:  [18  cmH20-22 cmH20] 22 cmH20  INTAKE / OUTPUT: I/O last 3 completed shifts: In: 1815.1 [P.O.:100; I.V.:805.1; NG/GT:160; IV Piggyback:750] Out: [address] [Urine:500; Other:9983]  PHYSICAL EXAMINATION: GENERAL: Extubated comfortable  HEAD: normocephalic, atraumatic EYE: PERRLA, EOM intact, no scleral icterus, no pallor. THROAT/ORAL CAVITY: ETT in situ. Mallampati classification cannot be determined at this time . NECK: supple, no thyromegaly, no JVD, no lymphadenopathy. Trachea midline. CHEST/LUNG: symmetric in development and expansion. Good air entry. no crackles. No wheezes. HEART: Irregularly irregular rate and rhythm without murmur, rub or gallop. ABDOMEN: soft, nontender, nondistended. Normoactive bowel sounds. No rebound. No guarding. No hepatosplenomegaly. EXTREMITIES: Edema: 2+ and improving. No cyanosis. No clubbing. 2+ DP pulses LYMPHATIC: no cervical/axiallary/inguinal lymph nodes appreciated MUSCULOSKELETAL: no joint tenderness, deformity or swelling. SKIN:  No rash or lesion. NEUROLOGIC: Doll's eyes intact. Corneal reflex intact. Spontaneous respirations intact. Cranial nerves II-XII are grossly symmetric and physiologic. Babinski absent. No sensory deficit. Motor: 5/5 @ RUE, 5/5 @ LUE, 5/5 @ RLL,  5/5 @ LLL.  DTR: 2+ @ R biceps, 2+ @ L biceps, 2+ @ R patellar,  2+ @ L patellar. No cerebellar signs. Gait was not assessed.   LABS:  SERUM CHEMISTRY Recent Labs  Lab 11/13/17 0400  11/14/17 0252 11/14/17 1557 11/15/17 0341  NA 136   < > 135 136 136  K 4.6   < > 4.3 4.6 4.5  CL 101   < > 101 101 101  CO2 24   < > 23 25 25   BUN 55*   < > 41* 35* 33*  CREATININE 4.16*   < > 3.03* 2.94* 3.10*  GLUCOSE 132*   < > 110* 97 93  CALCIUM 8.9   < > 8.7* 8.7* 8.8*  MG 2.4  --  2.7*  --  2.7*  PHOS 3.4  3.5   < > 3.4 5.1* 5.1*   < > = values in this interval not displayed.    Glucose Recent Labs  Lab 11/14/17 1226 11/14/17 1556 11/14/17 1952 11/14/17 2344 11/15/17 0340  11/15/17 0805  GLUCAP 95 89 80 101* 82 80    Liver Enzymes Recent Labs  Lab 11/12/17 1929  11/14/17 0252 11/14/17 1557 11/15/17 0341  AST 77*  --   --   --  125*  ALT 50  --   --   --  148*  ALKPHOS 182*  --   --   --  171*  BILITOT 2.1*  --   --   --  2.0*  ALBUMIN 3.3*   < > 3.2* 3.5 3.6   < > = values in this interval not displayed.    CBC  Recent Labs  Lab 11/12/17 1929 11/14/17 0018 11/14/17 0252 11/15/17 0341  WBC 11.9*  --  24.9* 21.6*  HGB 11.6* 13.9 11.7* 11.8*  HCT 40.2 41.0 38.2* 40.3  PLT 156  --  125* 90*    COAGULATION STUDIES Recent Labs  Lab 11/12/17 1104 11/12/17 1929 11/13/17 0400 11/14/17 0252 11/15/17 0341  APTT  --   --  62* 33 58*  INR 1.53 1.70  --   --  1.35    Sepsis Markers Recent Labs  Lab 11/12/17 1929 11/12/17 1936 11/13/17 0055 11/13/17 0400  LATICACIDVEN  --  5.7* 1.9 1.8  PROCALCITON 1.30  --   --   --     ABG Recent Labs  Lab 11/14/17 2123 11/14/17 2346 11/15/17 0520  PHART 7.264* 7.273* 7.309*  PCO2ART 62.0* 60.6* 53.3*  PO2ART 100.0 119.0* 76.5*    Cardiac Enzymes No results for input(s): TROPONINI, PROBNP in the last 168 hours.  CULTURES: Results for orders placed or performed during the hospital encounter of 11/03/17  Surgical pcr screen     Status: None   Collection Time: 11/08/17  9:58 PM  Result Value Ref Range Status   MRSA, PCR NEGATIVE NEGATIVE Final   Staphylococcus aureus NEGATIVE NEGATIVE Final    Comment: (NOTE) The Xpert SA Assay (FDA approved for NASAL specimens in patients 79 years of age and older), is one component of a comprehensive surveillance program. It is not intended to diagnose infection nor to guide or monitor treatment. Performed at [facility], [address] [place], [place] [address]     IMAGING: Dg Chest 1 View  Result Date: 11/15/2017 CLINICAL DATA:  Shortness of breath. EXAM: CHEST  1 VIEW COMPARISON:  11/14/2017. FINDINGS: Interim extubation removal of  NG tube. Right stable cardiomegaly. Low lung volumes with mild bibasilar atelectasis/infiltrates. No pleural effusion or pneumothorax. No acute bony abnormality. IMPRESSION: 1. Interim extubation removal of NG tube. Right IJ line in stable position. 2.  Cardiac pacer stable position.  Stable cardiomegaly. 3. Mild bibasilar atelectasis/infiltrates. No interim change. No definite effusions noted on today's exam. Electronically Signed   By: Maisie Fus  Register   On: 11/15/2017 09:57   ANTIBIOTICS: Cefazolin (4/22, perioperatively) Vanco and Zosyn 4/24>>>  SIGNIFICANT EVENTS: 4/22: Wide-complex tachyarrhythmia, prompting ACLS, intubation and transfer to ICU 4/24 extubated   LINES/TUBES: L fem CVL 4/22 > plan to remove on 4/25 [facility] cath RIJ 4/22 > Radial art 4/22 > ETT 4/22 > extubated 4/24   Eliyah Bazzi Pulmonary Critical Care & Sleep Medicine

## 2017-11-15 NOTE — Progress Notes (Signed)
[facility] NEPHROLOGY PROGRESS NOTE  Assessment/ Plan: Pt is a 57 y.o. yo male with chronic kidney disease stage IV, admitted with  congestive heart failure exacerbation and worsening renal failure.  Placement of right IJ tunnel catheter and AV fistula on 11/12/17 by VVS.  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  Placement of right upper extremity AV fistula and right IJ tunneled catheterby VVS on 4/22.  Patient had a cardiac arrest on 4/22 and transferred to ICU.  Started on CRRT.  Having extra UF with CRRT and tolerating well.  He is off pressor but is still grossly fluid overload and soft blood pressure.  Discussed with the cardiologist plan to continue CRRT for 1 more day.  He is extubated.   He is on 0K bath with acceptable serum potassium level.  Monitor BMP.  #Hyperkalemia: improved. Monitor BMP.  #Acute respiratory failure with hypoxia: Currently extubated.  Requiring intermittent BiPAP.  #CHF exacerbation with volume overload: He has leg edema.  Try to ultrafiltrate with dialysis.  #Hypotension improved.  Off pressor now.  #Anemia of CKD: Hemoglobin 11.7.  Iron saturation 17% with a serum iron of 78.  Ordered a dose of Feraheme 510 mg IV on 4/22.  #Secondary hyperparathyroidism:  Phosphorus 3.3.  Off binders  #Nonischemic cardiomyopathy status post ICD: EF of 25-30%.  #A. fib/flutter: On amiodarone  Subjective: Seen and examined in ICU.  Tolerating CRRT with extra ultrafiltration.  He is extubated and able to communicate.  Denies chest pain, shortness of breath.  His sister at bedside.  Objective Vital signs in last 24 hours: Vitals:   11/15/17 0603 11/15/17 0630 11/15/17 0700 11/15/17 0800  BP:   124/63 135/70  Pulse:      Resp: (!) 21 18 19 15   Temp:    97.8 F (36.6 C)  TempSrc:    Oral  SpO2: 100% (!) 86% 100% 100%  Weight:  129.2 kg (284 lb 13.4 oz)    Height:       Weight change: -6.3 kg  (-13 lb 14.2 oz)  Intake/Output Summary (Last 24 hours) at 11/15/2017 0843 Last data filed at 11/15/2017 0800 Gross per 24 hour  Intake 1224.86 ml  Output 8583 ml  Net -7358.14 ml       Labs: Basic Metabolic Panel: Recent Labs  Lab 11/14/17 0252 11/14/17 1557 11/15/17 0341  NA 135 136 136  K 4.3 4.6 4.5  CL 101 101 101  CO2 23 25 25   GLUCOSE 110* 97 93  BUN 41* 35* 33*  CREATININE 3.03* 2.94* 3.10*  CALCIUM 8.7* 8.7* 8.8*  PHOS 3.4 5.1* 5.1*   Liver Function Tests: Recent Labs  Lab 11/12/17 1929  11/14/17 0252 11/14/17 1557 11/15/17 0341  AST 77*  --   --   --  125*  ALT 50  --   --   --  148*  ALKPHOS 182*  --   --   --  171*  BILITOT 2.1*  --   --   --  2.0*  PROT 7.0  --   --   --  8.4*  ALBUMIN 3.3*   < > 3.2* 3.5 3.6   < > = values in this interval not displayed.   No results for input(s): LIPASE, AMYLASE in the last 168 hours. No results for input(s): AMMONIA in the last 168 hours. CBC: Recent Labs  Lab 11/09/17 0034 11/10/17 0558 11/11/17 0753  11/12/17 1104 11/12/17  1929 11/14/17 0018 11/14/17 0252 11/15/17 0341  WBC 11.1* 13.6* 12.9*  --  11.5* 11.9*  --  24.9* 21.6*  NEUTROABS 8.5* 10.1*  --   --   --   --   --   --  18.1*  HGB 10.7* 10.4* 10.6*   < > 11.7* 11.6* 13.9 11.7* 11.8*  HCT 36.6* 35.5* 36.7*   < > 39.7 40.2 41.0 38.2* 40.3  MCV 88.6 89.0 88.9  --  89.2 93.3  --  85.8 89.0  PLT 253 241 213  --  176 156  --  125* 90*   < > = values in this interval not displayed.   Cardiac Enzymes: No results for input(s): CKTOTAL, CKMB, CKMBINDEX, TROPONINI in the last 168 hours. CBG: Recent Labs  Lab 11/14/17 1556 11/14/17 1952 11/14/17 2344 11/15/17 0340 11/15/17 0805  GLUCAP 89 80 101* 82 80    Iron Studies:  Recent Labs    11/12/17 1104  IRON 78  TIBC 468*  FERRITIN 36   Studies/Results: Dg Chest Port 1 View  Result Date: 11/14/2017 CLINICAL DATA:  ET tube present.  Respiratory failure. EXAM: PORTABLE CHEST 1 VIEW  COMPARISON:  11/13/2017. FINDINGS: Stable cardiomegaly. Support tubes and lines are stable. BILATERAL pulmonary opacities and effusions, not significantly improved. No pneumothorax. IMPRESSION: Stable chest.  No significant worsening or improvement. Electronically Signed   By: Elsie Stain M.D.   On: 11/14/2017 11:03    Medications: Infusions: . sodium chloride Stopped (11/14/17 0800)  . sodium chloride    . sodium chloride    . calcium gluconate    . famotidine (PEPCID) IV Stopped (11/14/17 1330)  . fentaNYL infusion INTRAVENOUS Stopped (11/14/17 0830)  . ferumoxytol    . heparin 1,100 Units/hr (11/15/17 0505)  . norepinephrine (LEVOPHED) Adult infusion Stopped (11/14/17 0900)  . piperacillin-tazobactam Stopped (11/15/17 0535)  . dialysis replacement fluid (prismasate) 300 mL/hr at 11/15/17 0559  . dialysis replacement fluid (prismasate) 300 mL/hr at 11/15/17 0559  . dialysate (PRISMASATE) 1,500 mL/hr at 11/15/17 0832  . propofol (DIPRIVAN) infusion Stopped (11/13/17 0557)  . sodium chloride      Scheduled Medications: . allopurinol  100 mg Per Tube BID  . amiodarone  200 mg Per Tube Daily  . chlorhexidine  15 mL Mouth Rinse BID  . Chlorhexidine Gluconate Cloth  6 each Topical Daily  . insulin aspart  1-3 Units Subcutaneous Q4H  . ipratropium-albuterol  3 mL Nebulization Q6H  . mouth rinse  15 mL Mouth Rinse q12n4p  . sodium chloride flush  3 mL Intravenous Q12H  . sodium polystyrene  30 g Oral Once    have reviewed scheduled and prn medications.  Physical Exam: General: Extubated, lying, not in distress Heart: Regular rate rhythm S1-S2 normal.  No rubs Lungs: Distant breath sound. Abdomen: Mild distention, soft. extremities: Bilateral lower extremities pitting and chronic edema Dialysis Access: Right IJ tunnel catheter site looks clean.  Right AVF site has good thrill  Jessica Seidman Prasad Latoya Maulding 11/15/2017,8:43 AM  LOS: 11 days

## 2017-11-15 NOTE — Progress Notes (Signed)
Patient refusing BiPap overnight after lots of education from family (sister at bedside), this RN, and RT. MD made aware. Will continue to monitor patient.

## 2017-11-15 NOTE — Plan of Care (Signed)
  Problem: Skin Integrity: Goal: Risk for impaired skin integrity will decrease Outcome: Progressing   Problem: Activity: Goal: Capacity to carry out activities will improve 11/15/2017 2025 by Adelene Idler, RN Outcome: Progressing 11/15/2017 2024 by Adelene Idler, RN Outcome: Progressing   Problem: Activity: Goal: Ability to tolerate increased activity will improve 11/15/2017 2025 by Adelene Idler, RN Outcome: Progressing 11/15/2017 2024 by Adelene Idler, RN Outcome: Progressing   Problem: Education: Goal: Knowledge of disease and its progression will improve Outcome: Progressing

## 2017-11-15 NOTE — Progress Notes (Signed)
RT attempted to reapply the BiPAP mask and adjusted the settings for patient comfort.  The patient refuses to keep the BiPAP on.  Patient removed the mask;RN and family present. RT explained and educated the patient about the risk of not wearing the BiPAP as the CCM  Physicians ordered. RT applied [place]

## 2017-11-15 NOTE — Progress Notes (Signed)
ABG on [place] acceptable  Decrease FIO2 Plan for RA and BIPAP at night and prn  Michel Eskelson [facility] Pulmonary Critical Care & Sleep Medicine

## 2017-11-16 ENCOUNTER — Other Ambulatory Visit: Payer: Self-pay | Admitting: Internal Medicine

## 2017-11-16 DIAGNOSIS — G4733 Obstructive sleep apnea (adult) (pediatric): Secondary | ICD-10-CM

## 2017-11-16 LAB — CBC WITH DIFFERENTIAL/PLATELET
BASOS ABS: 0 10*3/uL (ref 0.0–0.1)
BASOS PCT: 0 %
EOS ABS: 0.1 10*3/uL (ref 0.0–0.7)
Eosinophils Relative: 1 %
HEMATOCRIT: 41.8 % (ref 39.0–52.0)
HEMOGLOBIN: 12.1 g/dL — AB (ref 13.0–17.0)
Lymphocytes Relative: 5 %
Lymphs Abs: 1.2 10*3/uL (ref 0.7–4.0)
MCH: 26 pg (ref 26.0–34.0)
MCHC: 28.9 g/dL — ABNORMAL LOW (ref 30.0–36.0)
MCV: 89.7 fL (ref 78.0–100.0)
Monocytes Absolute: 3 10*3/uL — ABNORMAL HIGH (ref 0.1–1.0)
Monocytes Relative: 12 %
NEUTROS ABS: 20.4 10*3/uL — AB (ref 1.7–7.7)
NEUTROS PCT: 82 %
Platelets: 85 10*3/uL — ABNORMAL LOW (ref 150–400)
RBC: 4.66 MIL/uL (ref 4.22–5.81)
RDW: 16.2 % — AB (ref 11.5–15.5)
WBC: 24.7 10*3/uL — ABNORMAL HIGH (ref 4.0–10.5)

## 2017-11-16 LAB — POCT I-STAT 3, ART BLOOD GAS (G3+)
Acid-base deficit: 1 mmol/L (ref 0.0–2.0)
Bicarbonate: 27.2 mmol/L (ref 20.0–28.0)
O2 SAT: 94 %
PCO2 ART: 58.2 mmHg — AB (ref 32.0–48.0)
TCO2: 29 mmol/L (ref 22–32)
pH, Arterial: 7.275 — ABNORMAL LOW (ref 7.350–7.450)
pO2, Arterial: 82 mmHg — ABNORMAL LOW (ref 83.0–108.0)

## 2017-11-16 LAB — GLUCOSE, CAPILLARY
GLUCOSE-CAPILLARY: 99 mg/dL (ref 65–99)
GLUCOSE-CAPILLARY: 92 mg/dL (ref 65–99)
GLUCOSE-CAPILLARY: 106 mg/dL — AB (ref 65–99)
Glucose-Capillary: 108 mg/dL — ABNORMAL HIGH (ref 65–99)
Glucose-Capillary: 97 mg/dL (ref 65–99)
Glucose-Capillary: 104 mg/dL — ABNORMAL HIGH (ref 65–99)

## 2017-11-16 LAB — BASIC METABOLIC PANEL
ANION GAP: 15 (ref 5–15)
BUN: 27 mg/dL — ABNORMAL HIGH (ref 6–20)
CALCIUM: 8.9 mg/dL (ref 8.9–10.3)
CO2: 24 mmol/L (ref 22–32)
CREATININE: 3.75 mg/dL — AB (ref 0.61–1.24)
Chloride: 94 mmol/L — ABNORMAL LOW (ref 101–111)
GFR, EST AFRICAN AMERICAN: 19 mL/min — AB (ref >60)
GFR, EST NON AFRICAN AMERICAN: 17 mL/min — AB (ref >60)
Glucose, Bld: 106 mg/dL — ABNORMAL HIGH (ref 65–99)
Potassium: 4.3 mmol/L (ref 3.5–5.1)
SODIUM: 133 mmol/L — AB (ref 135–145)

## 2017-11-16 LAB — BLOOD GAS, ARTERIAL
ACID-BASE EXCESS: 0.1 mmol/L (ref 0.0–2.0)
Bicarbonate: 26.4 mmol/L (ref 20.0–28.0)
DRAWN BY: 404151
O2 CONTENT: 3 L/min
O2 SAT: 99.2 %
PATIENT TEMPERATURE: 98.6
pCO2 arterial: 61.4 mmHg — ABNORMAL HIGH (ref 32.0–48.0)
pH, Arterial: 7.256 — ABNORMAL LOW (ref 7.350–7.450)
pO2, Arterial: 187 mmHg — ABNORMAL HIGH (ref 83.0–108.0)

## 2017-11-16 LAB — MAGNESIUM: Magnesium: 3 mg/dL — ABNORMAL HIGH (ref 1.7–2.4)

## 2017-11-16 LAB — HEPATIC FUNCTION PANEL
ALBUMIN: 3.7 g/dL (ref 3.5–5.0)
ALK PHOS: 189 U/L — AB (ref 38–126)
ALT: 136 U/L — ABNORMAL HIGH (ref 17–63)
AST: 90 U/L — AB (ref 15–41)
Bilirubin, Direct: 0.9 mg/dL — ABNORMAL HIGH (ref 0.1–0.5)
Indirect Bilirubin: 1.2 mg/dL — ABNORMAL HIGH (ref 0.3–0.9)
TOTAL PROTEIN: 9.2 g/dL — AB (ref 6.5–8.1)
Total Bilirubin: 2.1 mg/dL — ABNORMAL HIGH (ref 0.3–1.2)

## 2017-11-16 LAB — POTASSIUM: POTASSIUM: 4.3 mmol/L (ref 3.5–5.1)

## 2017-11-16 LAB — PHOSPHORUS: PHOSPHORUS: 5.3 mg/dL — AB (ref 2.5–4.6)

## 2017-11-16 LAB — VANCOMYCIN, RANDOM: VANCOMYCIN RM: 23

## 2017-11-16 LAB — APTT: APTT: 58 s — AB (ref 24–36)

## 2017-11-16 LAB — HEPARIN LEVEL (UNFRACTIONATED): Heparin Unfractionated: 0.3 IU/mL (ref 0.30–0.70)

## 2017-11-16 MED ORDER — APIXABAN 5 MG PO TABS
5.0000 mg | ORAL_TABLET | Freq: Two times a day (BID) | ORAL | Status: DC
  Administered 2017-11-16 – 2017-11-22 (×11): 5 mg via ORAL
  Filled 2017-11-16 (×11): qty 1

## 2017-11-16 MED ORDER — CALCIUM ACETATE (PHOS BINDER) 667 MG PO CAPS
667.0000 mg | ORAL_CAPSULE | Freq: Three times a day (TID) | ORAL | Status: DC
  Administered 2017-11-16 – 2017-11-22 (×14): 667 mg via ORAL
  Filled 2017-11-16 (×14): qty 1

## 2017-11-16 MED ORDER — NEPRO/CARBSTEADY PO LIQD
237.0000 mL | Freq: Two times a day (BID) | ORAL | Status: DC
  Administered 2017-11-16: 237 mL via ORAL
  Filled 2017-11-16 (×11): qty 237

## 2017-11-16 MED ORDER — IPRATROPIUM-ALBUTEROL 0.5-2.5 (3) MG/3ML IN SOLN: 3.0000 mL | RESPIRATORY_TRACT | Status: DC | PRN

## 2017-11-16 MED ORDER — PIPERACILLIN-TAZOBACTAM 3.375 G IVPB
3.3750 g | Freq: Two times a day (BID) | INTRAVENOUS | Status: DC
  Administered 2017-11-16 – 2017-11-18 (×4): 3.375 g via INTRAVENOUS
  Filled 2017-11-16 (×4): qty 50

## 2017-11-16 NOTE — Evaluation (Signed)
Occupational Therapy Evaluation Patient Details Name: Eric Murillo MRN: [id number] DOB: 17-Apr-1961 Today's Date: 11/16/2017    History of Present Illness Pt is a 57 y.o. male admitted 11/03/17 with cough and SOB, found to be volume overloaded and in A-flutter with controlled ventricular rate; worked up for HF and a-flutter. On 4/22, had wide-complex tacharrhythmia prompting ACLS, intubation and transfer to ICU; emergently started on CVVHD. Permanent RIJ HD cath and AVG placed 4/22. Extubated 4/24.  Remains on CRRT as of 4/25. PMH includes AICD placement, OA, CHF, CKD, gout, HTN, morbid obesity, h/o cardiac cath, cardioversion, multiple TEE.    Clinical Impression   PTA Pt independent in ADL and transfers. Pt is currently set up in sitting for UB ADL (grooming/eating) and min guard for LB ADL (able to don/doff socks min guard EOB). Pt will benefit from skilled OT in the acute setting prior to dc to maximize safety and independence in ADL and to address generalized deconditioning and continue energy conservation education. Next session to focus on sink level grooming, and OOB transfer.     Follow Up Recommendations  No OT follow up;Supervision/Assistance - 24 hour(initially)    Equipment Recommendations  Tub/shower seat    Recommendations for Other Services       Precautions / Restrictions Precautions Precautions: Fall Precaution Comments: ART line Restrictions Weight Bearing Restrictions: No      Mobility Bed Mobility Overal bed mobility: Needs Assistance Bed Mobility: Supine to Sit;Sit to Supine     Supine to sit: Supervision Sit to supine: Supervision   General bed mobility comments: supervision for safety and line management  Transfers Overall transfer level: Needs assistance Equipment used: None Transfers: Sit to/from Stand Sit to Stand: Min guard         General transfer comment: Min guard for balance    Balance Overall balance assessment: Needs  assistance Sitting-balance support: Feet supported Sitting balance-Leahy Scale: Good Sitting balance - Comments: able to don/doff socks sitting EOB   Standing balance support: No upper extremity supported;During functional activity Standing balance-Leahy Scale: Fair Standing balance comment: Can static stand with no UE support; required HHA to maintain dynamic balance                           ADL either performed or assessed with clinical judgement   ADL Overall ADL's : Needs assistance/impaired Eating/Feeding: Set up;Sitting Eating/Feeding Details (indicate cue type and reason): chair position in bed at EOS, able to manage utensils and self feed Grooming: Wash/dry face;Set up   Upper Body Bathing: Moderate assistance   Lower Body Bathing: Min guard   Upper Body Dressing : Minimal assistance   Lower Body Dressing: Minimal assistance;Sitting/lateral leans Lower Body Dressing Details (indicate cue type and reason): able to cross feet to knees for socks, min A for sit to stand Toilet Transfer: Minimal assistance   Toileting- Clothing Manipulation and Hygiene: Moderate assistance       Functional mobility during ADLs: Minimal assistance       Vision Baseline Vision/History: No visual deficits Patient Visual Report: No change from baseline       Perception     Praxis      Pertinent Vitals/Pain Pain Assessment: Faces Faces Pain Scale: No hurt Pain Intervention(s): Monitored during session;Repositioned     Hand Dominance Right   Extremity/Trunk Assessment Upper Extremity Assessment Upper Extremity Assessment: Overall WFL for tasks assessed   Lower Extremity Assessment Lower Extremity Assessment: Overall WFL for  tasks assessed   Cervical / Trunk Assessment Cervical / Trunk Assessment: Normal   Communication Communication Communication: No difficulties   Cognition Arousal/Alertness: Awake/alert Behavior During Therapy: Flat affect Overall  Cognitive Status: No family/caregiver present to determine baseline cognitive functioning                                 General Comments: Pt is able to follow all commands appropriately, flat and sleepy throughout session   General Comments  VSS throughout session. O2 >94% throughout session on 2L    Exercises     Shoulder Instructions      Home Living Family/patient expects to be discharged to:: Private residence Living Arrangements: Parent Available Help at Discharge: Family;Available 24 hours/day Type of Home: House Home Access: Level entry     Home Layout: One level     Bathroom Shower/Tub: Chief Strategy Officer: Standard     Home Equipment: Bedside commode   Additional Comments: Lives with parent. Other family available to provide PRN assist if needed      Prior Functioning/Environment Level of Independence: Independent                 OT Problem List: Decreased activity tolerance;Impaired balance (sitting and/or standing);Decreased safety awareness;Decreased knowledge of use of DME or AE;Obesity      OT Treatment/Interventions: Self-care/ADL training;Therapeutic exercise;Energy conservation;DME and/or AE instruction;Therapeutic activities;Balance training;Patient/family education    OT Goals(Current goals can be found in the care plan section) Acute Rehab OT Goals Patient Stated Goal: Return home and lose weight OT Goal Formulation: With patient Time For Goal Achievement: 11/30/17 Potential to Achieve Goals: Good ADL Goals Pt Will Perform Grooming: with modified independence;standing Pt Will Transfer to Toilet: with modified independence;ambulating;regular height toilet Pt Will Perform Toileting - Clothing Manipulation and hygiene: with modified independence;sit to/from stand Additional ADL Goal #1: Pt will recall 3 ways of conserving energy during ADL routine at independent level  OT Frequency: Min 2X/week   Barriers to  D/C:            Co-evaluation              AM-PAC PT "6 Clicks" Daily Activity     Outcome Measure Help from another person eating meals?: A Little Help from another person taking care of personal grooming?: A Little Help from another person toileting, which includes using toliet, bedpan, or urinal?: A Little Help from another person bathing (including washing, rinsing, drying)?: A Little Help from another person to put on and taking off regular upper body clothing?: A Little Help from another person to put on and taking off regular lower body clothing?: A Little 6 Click Score: 18   End of Session Equipment Utilized During Treatment: Oxygen(2L) Nurse Communication: Mobility status  Activity Tolerance: Patient tolerated treatment well Patient left: in bed;with call bell/phone within reach  OT Visit Diagnosis: Unsteadiness on feet (R26.81);Muscle weakness (generalized) (M62.81)                Time: 6962-9528 OT Time Calculation (min): 19 min Charges:  OT General Charges $OT Visit: 1 Visit OT Evaluation $OT Eval Moderate Complexity: 1 Mod G-Codes:     Sherryl Manges OTR/L [phone]  Evern Bio Debborah Alonge 11/16/2017, 3:00 PM

## 2017-11-16 NOTE — Progress Notes (Signed)
Nutrition Follow-up  DOCUMENTATION CODES:   Morbid obesity  INTERVENTION:   - Nepro Shake po BID (Butter Pecan flavor), each supplement provides 425 kcal and 19 grams protein  - Encourage PO intake  NUTRITION DIAGNOSIS:   Inadequate oral intake related to inability to eat(NPO status) as evidenced by NPO status.  Progressing as pt now on Heart Healthy diet  GOAL:   Patient will meet greater than or equal to 90% of their needs  Improving  MONITOR:   PO intake, Supplement acceptance, Labs, I & O's, Weight trends  REASON FOR ASSESSMENT:   Ventilator    ASSESSMENT:   57 y.o. M with newly diagnosed ESRD admitted 11/03/17 for SOB. Pt had surgery to place dialysis catheter and fistula 11/12/17. Pt then went into cardiac arrest 11/12/17. Pt intubated with sedation and went on CRRT after intubation 11/12/17. PMH of HTN, HLD, CHF, morbid obesity, gout, CKD stg 3 - now ESRD, OSA, submandibular sialolithiasis, anemia of chronic disease, hyperglycemia, arthritis, and AICD.   11/14/17 - extubated 11/15/17 - SLP recommended regular diet with thin liquids 11/16/17 - CRRT will be discontinued, plan for iHD tomorrow  Spoke with pt and family members at bedside. Pt states that he is eating well and that his appetite is "almost back to normal." Pt's family members disagreed stating that "he is not eating much." RN confirmed pt received a meal tray with [place] toast and eggs this morning and ate ~50%.  Pt agreeable to trying Nepro oral nutrition supplement. RD brought one to pt to try. Pt stated "it's not bad" and would like to receive the butter pecan flavor.  Meal Completion: 100% - unsure of accuracy  I/O's: -30.9 L since admission Pt's weight is down 56 lbs since admission.  Medications reviewed and include: Phoslo TID, sliding scale Novolog, IV Pepcid, heparin  Labs reviewed: sodium 133 (L), chloride 94 (L), BUN 27 (H), creatinine 3.75 (H), phosphorus 5.3 (H), magnesium 3.0 (H), elevated  AST and ALT CBG's: 92, 99, 103, 113, 103 , 118 x 24 hours  Diet Order:  Diet renal/carb modified with fluid restriction Fluid restriction: 1200 mL Fluid; Room service appropriate? Yes; Fluid consistency: Thin  EDUCATION NEEDS:   No education needs have been identified at this time  Skin:  Skin Assessment: Skin Integrity Issues: Skin Integrity Issues:: Incisions Incisions: R chest, R arm.   Last BM:  11/08/17  Height:   Ht Readings from Last 1 Encounters:  11/13/17 5' 9.5" (1.765 m)    Weight:   Wt Readings from Last 1 Encounters:  11/16/17 276 lb 7.3 oz (125.4 kg)    Ideal Body Weight:  74.09 kg  BMI:  Body mass index is 40.24 kg/m.  Estimated Nutritional Needs:   Kcal:  2100-2300 kcal/day  Protein:  120-135 kcal/day  Fluid:  per MD goals    Earma Reading, MS, RD, LDN Pager: [phone] Weekend/After Hours: [phone]

## 2017-11-16 NOTE — Progress Notes (Signed)
  Speech Language Pathology Treatment: Dysphagia  Patient Details Name: Eric Murillo MRN: [id number] DOB: 06-03-1961 Today's Date: 11/16/2017 Time: 3570-1779 SLP Time Calculation (min) (ACUTE ONLY): 10 min  Assessment / Plan / Recommendation Clinical Impression  SLP provided skilled observation as pt consumed a snack of regular textures and thin liquids via straw. No overt s/s of aspiration were observed and he used general aspiration precautions with Mod I. He denies any subjective complaints with meals. Recommend to continue with current diet and precautions. Given sister's report that his voice remains mildly hoarse, will provide additional, brief f/u to assess for tolerance.    HPI HPI: Pt is a57 y.o.yo malewith chronic kidney disease stage IV, admitted with congestive heart failure exacerbation and worsening renal failure. Intubated 4/22-4/24. PMHx includes HTN, obstructive sleep apnea, CHF, and CKD. Not before seen by SLP. CXR (4/24) revealed mild bibasilar atelectasis/infiltrates, no interim change, and no definite effusions.      SLP Plan  Continue with current plan of care       Recommendations  Diet recommendations: Regular;Thin liquid Liquids provided via: Cup;Straw Medication Administration: Whole meds with liquid Supervision: Patient able to self feed;Intermittent supervision to cue for compensatory strategies Compensations: Slow rate;Small sips/bites Postural Changes and/or Swallow Maneuvers: Seated upright 90 degrees                Oral Care Recommendations: Oral care BID Follow up Recommendations: None SLP Visit Diagnosis: Dysphagia, unspecified (R13.10) Plan: Continue with current plan of care       GO                Maxcine Ham 11/16/2017, 12:26 PM  Maxcine Ham, M.A. CCC-SLP [phone]

## 2017-11-16 NOTE — Progress Notes (Signed)
PULMONARY / CRITICAL CARE MEDICINE   Name: Eric Murillo MRN: [id number] DOB: 1960/09/30    ADMISSION DATE:  11/03/2017 CONSULTATION DATE:  11/12/2017  REFERRING MD:  Jearld Pies  CHIEF COMPLAINT:  S/P cardiac arrect  HISTORY OF PRESENT ILLNESS:   This is a 57 year old male with a past medical history of A. fib on Eliquis, systolic heart failure with EF of 20-25%, CKD stage IV, obstructive sleep apnea currently not being treated, morbid obesity presented on November 03, 2017 with flulike symptoms.  He was diagnosed with acute on chronic heart failure and worsening AK I.  Diuresis did not resolve his symptoms and he had a dialysis catheter placed on April 18 for hemodialysis beginning April 19.  He is status post right upper extremity fistula placement on 11/12/2017.  However, post-procedure he had worsening mental status, hypoxia and a CP arrest with 10 minutes of ACLS with multiple defibrillations due to wide-complex tachycardia.  Patient was intubated and transferred to the ICU. He has since been extubated on 4/24 but because of hypercapnea and untreated OSA, has been on BiPAP hs ands prn.  PAST MEDICAL HISTORY :  He  has a past medical history of AICD (automatic cardioverter/defibrillator) present, Anemia of chronic disease, Arthritis, Atrial flutter (HCC), Chest pain on exertion, Chronic systolic congestive heart failure, NYHA class 2 (HCC), CKD (chronic kidney disease) stage 3, GFR 30-59 ml/min (HCC), Dyslipidemia, Gout, Hyperglycemia, Hyperlipidemia, Hypertension, Morbid obesity with BMI of 45.0-49.9, adult (HCC), Non-ischemic cardiomyopathy (HCC), Organic erectile dysfunction, OSA (obstructive sleep apnea), Pilonidal cyst, and Submandibular sialolithiasis.  PAST SURGICAL HISTORY: He  has a past surgical history that includes Colonoscopy; Salivary gland surgery (09/12/2012); Cardiac catheterization (2012); Submandibular gland excision (Right, 09/12/2012); TEE without cardioversion (N/A, 04/03/2014);  Cardioversion (N/A, 04/03/2014); left and right heart catheterization with coronary angiogram (N/A, 04/24/2014); Cardiac catheterization (N/A, 05/21/2015); Cardioversion (N/A, 03/09/2016); TEE without cardioversion (N/A, 03/09/2016); TEE without cardioversion (N/A, 06/06/2017); Cardioversion (N/A, 06/06/2017); IR Fluoro Guide CV Line Right (11/08/2017); IR US Guide Vasc Access Right (11/08/2017); Insertion of dialysis catheter (Right, 11/12/2017); and AV fistula placement (Right, 11/12/2017).  Allergies  Allergen Reactions  . Nsaids Other (See Comments)    Cannot take to due kidney issues  . Beta Adrenergic Blockers Other (See Comments)    "An unnamed, white-colored Beta Blocker made" him "feel funny" = made him feel cagey    No current facility-administered medications on file prior to encounter.    Current Outpatient Medications on File Prior to Encounter  Medication Sig  . acetaminophen (TYLENOL) 500 MG tablet Take 2 tablets (1,000 mg total) by mouth every 8 (eight) hours as needed. (Patient taking differently: Take 1,000 mg every 8 (eight) hours as needed by mouth (for pain or headaches). )  . allopurinol (ZYLOPRIM) 100 MG tablet TAKE 1 TABLET BY MOUTH TWO TIMES DAILY  . amiodarone (PACERONE) 200 MG tablet Take 1 tablet (200 mg total) by mouth daily. TAKE 2 TABLETS DAILY UNTIL 06/14/2017, and then resume 1 daily (Patient taking differently: Take 200 mg by mouth daily. )  . carvedilol (COREG) 6.25 MG tablet Take 1 tablet (6.25 mg total) 2 (two) times daily with a meal by mouth.  . colchicine 0.6 MG tablet Take 0.6 mg 2 (two) times daily as needed by mouth (FOR GOUT FLARES). Reported on 01/18/2016  . diclofenac sodium (VOLTAREN) 1 % GEL Apply 4 g topically 4 (four) times daily.  Marland Kitchen ELIQUIS 5 MG TABS tablet TAKE ONE TABLET BY MOUTH TWICE DAILY  . isosorbide-hydrALAZINE (BIDIL) 20-37.5  MG tablet Take 1 tablet 3 (three) times daily by mouth.  . metolazone (ZAROXOLYN) 2.5 MG tablet Take 1 tablet (2.5 mg  total) by mouth once a week. Every Wednesday, take extra as directed  . potassium chloride SA (KLOR-CON M20) 20 MEQ tablet Take 2 tablets (40 mEq total) by mouth 3 (three) times daily.  Marland Kitchen torsemide (DEMADEX) 20 MG tablet Take 5 tablets (100 mg total) by mouth 2 (two) times daily.    FAMILY HISTORY:  His indicated that his mother is alive. He indicated that his father is deceased. He indicated that his sister is alive. He indicated that his brother is alive. He indicated that his maternal grandmother is deceased. He indicated that his maternal grandfather is deceased. He indicated that his paternal grandmother is deceased. He indicated that his paternal grandfather is deceased. He indicated that the status of his maternal aunt is unknown. He indicated that the status of his neg hx is unknown.   SOCIAL HISTORY: He  reports that he has never smoked. He has never used smokeless tobacco. He reports that he does not drink alcohol or use drugs.  REVIEW OF SYSTEMS:   Per HPI  SUBJECTIVE:  Voicing no c/o  VITAL SIGNS: BP (!) 143/69   Pulse 86   Temp 97.6 F (36.4 C) (Oral)   Resp (!) 21   Ht 5' 9.5" (1.765 m)   Wt 125.4 kg (276 lb 7.3 oz)   SpO2 98%   BMI 40.24 kg/m   INTAKE / OUTPUT: I/O last 3 completed shifts: In: 2035 [P.O.:1045; I.V.:390; IV Piggyback:600] Out: 16109 [Other:11253]  PHYSICAL EXAMINATION: General:  Obese B M in NARD Neuro:  A&O x3. No focal deficits HEENT:  [place]/AT; PRRL; EOMI; O-P Neg Cardiovascular:  irreg rhythm. No murmurs Lungs:  Clear bilaterally Abdomen:  Protuberant, NT. No demonstrable H-S-megaly Musculoskeletal:  No active joints Skin:  Tr edema  LABS:  BMET Recent Labs  Lab 11/15/17 0341 11/15/17 1616 11/15/17 2340 11/16/17 0359  NA 136 134*  --  133*  K 4.5 4.9 4.3 4.3  CL 101 98*  --  94*  CO2 25 24  --  24  BUN 33* 32*  --  27*  CREATININE 3.10* 3.29*  --  3.75*  GLUCOSE 93 106*  --  106*    Electrolytes Recent Labs  Lab  11/14/17 0252  11/15/17 0341 11/15/17 1616 11/16/17 0359  CALCIUM 8.7*   < > 8.8* 8.7* 8.9  MG 2.7*  --  2.7*  --  3.0*  PHOS 3.4   < > 5.1* 4.7* 5.3*   < > = values in this interval not displayed.    CBC Recent Labs  Lab 11/14/17 0252 11/15/17 0341 11/16/17 0359  WBC 24.9* 21.6* 24.7*  HGB 11.7* 11.8* 12.1*  HCT 38.2* 40.3 41.8  PLT 125* 90* 85*    Coag's Recent Labs  Lab 11/12/17 1104 11/12/17 1929  11/14/17 0252 11/15/17 0341 11/16/17 0359  APTT  --   --    < > 33 58* 58*  INR 1.53 1.70  --   --  1.35  --    < > = values in this interval not displayed.    Sepsis Markers Recent Labs  Lab 11/12/17 1929 11/12/17 1936 11/13/17 0055 11/13/17 0400  LATICACIDVEN  --  5.7* 1.9 1.8  PROCALCITON 1.30  --   --   --     ABG Recent Labs  Lab 11/15/17 1150 11/15/17 2140 11/16/17 0405  PHART 7.308* 7.241* 7.256*  PCO2ART 52.2* 64.0* 61.4*  PO2ART 87.0 108 187*    Liver Enzymes Recent Labs  Lab 11/12/17 1929  11/15/17 0341 11/15/17 1616 11/16/17 0359  AST 77*  --  125*  --  90*  ALT 50  --  148*  --  136*  ALKPHOS 182*  --  171*  --  189*  BILITOT 2.1*  --  2.0*  --  2.1*  ALBUMIN 3.3*   < > 3.6 3.7 3.7   < > = values in this interval not displayed.    Cardiac Enzymes No results for input(s): TROPONINI, PROBNP in the last 168 hours.  Glucose Recent Labs  Lab 11/15/17 1621 11/15/17 1922 11/15/17 2338 11/16/17 0402 11/16/17 0809 11/16/17 1155  GLUCAP 103* 113* 103* 99 92 106*    Imaging No results found.   STUDIES:  PCXR 4/25 NAD  CULTURES: BC neg at 48 hr  ANTIBIOTICS: Zosyn + Vanc  SIGNIFICANT EVENTS: 4/22: Wide-complex tachyarrhythmia, prompting ACLS, intubation and transfer to ICU 4/24 extubated   LINES/TUBES: Perm cath RIJ 4/22 > Radial art 4/22 >  DISCUSSION: 57 y.o. M S/P wide-complex tachyacardia with CP-arrest. Denies prior tobacco hx or occupational dust/chemical exposure but has OSA with  hypercapnea.  ASSESSMENT / PLAN:  PULMONARY A: ABGs this AM on 3LNC suggest that the patient is being over-oxygenated (though I doubt the PaO2 value) P:   Decrease to 1L nasal O2 and re-check ABGs. BiPAP for significant hypercapnea and hs for OSA.  CARDIOVASCULAR A:  Acute on chronic systolic HF and atypical S-flutter P:  On amiodarone per Cardiol. 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 On diet P:   D/c Pepcid  HEMATOLOGIC A:   TCP with adequate H&H P:  Follow trend  INFECTIOUS A:   Persistently elevated WBC but remains afebrile. No obvious source of infection P:   If cultures and clinical course remain stable/improved, will d/c empiric ABxs  ENDOCRINE A:   Adequate glu   P:   Cont SSI  NEUROLOGIC A:   Improved metabolic encephalopathy P:   RASS goal: N/A post-extubation Min-no sedation   Critical Care time: 40 min   Pulmonary and Critical Care Medicine [facility] Pager: [phone]  11/16/2017, 1:38 PM

## 2017-11-16 NOTE — Progress Notes (Addendum)
Patient ID: Eric Murillo, male   DOB: 11/26/1960, 57 y.o.   MRN: [id number]     Advanced Heart Failure Rounding Note  PCP-Cardiologist: [name], MD   Subjective:    Had cardiac arrest 11/12/17 in setting of hyperK. Intubated and emergently started on CVVHD.   Extubated 11/14/17.  Now off CVVHD. Weight stable at 272. (down 41 pounds since arrest)  Denies CP or SOB.  SBP 110-120 range. Remains in AF 70-80s.   Objective:   Weight Range: 276 lb 7.3 oz (125.4 kg) Body mass index is 40.24 kg/m.   Vital Signs:   Temp:  [97.3 F (36.3 C)-98 F (36.7 C)] 97.5 F (36.4 C) (04/26 0810) Pulse Rate:  [84-96] 86 (04/26 0400) Resp:  [12-23] 22 (04/26 0700) BP: (113-154)/(56-92) 118/75 (04/26 0700) SpO2:  [91 %-100 %] 100 % (04/26 0806) Arterial Line BP: (85-128)/(46-71) 108/60 (04/26 0700) FiO2 (%):  [32 %] 32 % (04/25 0858) Weight:  [276 lb 7.3 oz (125.4 kg)] 276 lb 7.3 oz (125.4 kg) (04/26 0600) Last BM Date: 11/08/17(per chart )  Weight change: Filed Weights   11/14/17 0605 11/15/17 0630 11/16/17 0600  Weight: 298 lb 11.6 oz (135.5 kg) 284 lb 13.4 oz (129.2 kg) 276 lb 7.3 oz (125.4 kg)    Intake/Output:   Intake/Output Summary (Last 24 hours) at 11/16/2017 0832 Last data filed at 11/16/2017 0700 Gross per 24 hour  Intake 1687 ml  Output 7096 ml  Net -5409 ml      Physical Exam   General:  Sitting up in bed HEENT: normal anicteric Neck: supple. JVP 7-8 Carotids 2+ bilat; no bruits. No lymphadenopathy or thryomegaly appreciated. Cor: PMI laterally displaced. Irregular  Lungs: clear Abdomen: obese soft, nontender, nondistended. No hepatosplenomegaly. No bruits or masses. Good bowel sounds. Extremities: no cyanosis, clubbing, rash, edema RUE AVF with thrill  L radial arterial line  Neuro: alert & orientedx3, cranial nerves grossly intact. moves all 4 extremities w/o difficulty. Affect pleasant  Telemetry   Aflutter 80-90s, personally reviewed.   EKG   No new  tracings.  Labs    CBC Recent Labs    11/15/17 0341 11/16/17 0359  WBC 21.6* 24.7*  NEUTROABS 18.1* 20.4*  HGB 11.8* 12.1*  HCT 40.3 41.8  MCV 89.0 89.7  PLT 90* 85*   Basic Metabolic Panel Recent Labs    40/98/11 0341 11/15/17 1616 11/15/17 2340 11/16/17 0359  NA 136 134*  --  133*  K 4.5 4.9 4.3 4.3  CL 101 98*  --  94*  CO2 25 24  --  24  GLUCOSE 93 106*  --  106*  BUN 33* 32*  --  27*  CREATININE 3.10* 3.29*  --  3.75*  CALCIUM 8.8* 8.7*  --  8.9  MG 2.7*  --   --  3.0*  PHOS 5.1* 4.7*  --  5.3*   Liver Function Tests Recent Labs    11/15/17 0341 11/15/17 1616 11/16/17 0359  AST 125*  --  90*  ALT 148*  --  136*  ALKPHOS 171*  --  189*  BILITOT 2.0*  --  2.1*  PROT 8.4*  --  9.2*  ALBUMIN 3.6 3.7 3.7   No results for input(s): LIPASE, AMYLASE in the last 72 hours. Cardiac Enzymes No results for input(s): CKTOTAL, CKMB, CKMBINDEX, TROPONINI in the last 72 hours.  BNP: BNP (last 3 results) Recent Labs    07/26/17 0916 11/03/17 1238 11/13/17 0400  BNP 399.7* 1,160.1* 1,085.3*  ProBNP (last 3 results) No results for input(s): PROBNP in the last 8760 hours.   D-Dimer No results for input(s): DDIMER in the last 72 hours. Hemoglobin A1C No results for input(s): HGBA1C in the last 72 hours. Fasting Lipid Panel No results for input(s): CHOL, HDL, LDLCALC, TRIG, CHOLHDL, LDLDIRECT in the last 72 hours. Thyroid Function Tests No results for input(s): TSH, T4TOTAL, T3FREE, THYROIDAB in the last 72 hours.  Invalid input(s): FREET3  Other results:   Imaging    No results found.   Medications:     Scheduled Medications: . allopurinol  100 mg Oral BID  . amiodarone  200 mg Oral Daily  . apixaban  5 mg Oral BID  . calcium acetate  667 mg Oral TID WC  . chlorhexidine  15 mL Mouth Rinse BID  . Chlorhexidine Gluconate Cloth  6 each Topical Daily  . feeding supplement (NEPRO CARB STEADY)  237 mL Oral BID BM  . insulin aspart  1-3 Units  Subcutaneous Q4H  . mouth rinse  15 mL Mouth Rinse q12n4p  . sodium chloride flush  3 mL Intravenous Q12H    Infusions: . sodium chloride Stopped (11/14/17 0800)  . sodium chloride    . sodium chloride    . sodium chloride    . sodium chloride    . albumin human    . calcium gluconate    . ferumoxytol    . piperacillin-tazobactam (ZOSYN)  IV Stopped (11/16/17 2141)  . sodium chloride    . vancomycin 1 g (11/17/17 1228)    PRN Medications: sodium chloride, sodium chloride, sodium chloride, acetaminophen, albuterol, alteplase, bisacodyl, docusate, fentaNYL, heparin, lidocaine (PF), lidocaine (PF), lidocaine-prilocaine, pentafluoroprop-tetrafluoroeth, sodium chloride, sodium chloride flush  Assessment/Plan   1. Cardiac arrest - Had arrest on 4/22 due to hyperkalemia with subsequent VF, - Much improved with CVVHD.  2. Acute on chronic systolic CHF: Nonischemic cardiomyopathy x years. Last echo in 11/18 with moderate LV dilation, EF 20-25%, mildly decreased RV systolic function.  He had a workup for cardiac amyloidosis in the past.  However, negative SPEP and abdominal fat pad biopsy negative. Low voltage on ECG may be due to obesity and not amyloidosis. CKD stage IV makes management of volume difficult.  - Volume status improved with HD. Now down 41 pounds. Close to euvolemia - Tolerating iHD well - Off b-blocker with arrest. Can restart low-dose carvedilol as tolerated  - No ACEI/ARB/ARNI/spironolactone with ESRD 3. Atypical atrial flutter: Recurrent. Most recent DCCV in 11/18.  Saw Dr Elberta Fortis, not thought to be a good ablation candidate.  - Remains in AFL. VRates 70-90bpm - Now back on po amiodarone.  - Platelets dropped with heparin. Switched to apixaban. HIT panel sent. PLTs 73k. No bleeding. Discussed dosing with PharmD personally. 4. AKI on CKD stage IV -> now ESRD: - Nephrology following  - Tunneled HD cath and AVF placed 4/22.  - Tolerating iHD well  5. OSA:  - Has been  unable to tolerate CPAP.  - No change to current plan.   6. Morbid obesity -Body mass index is 40.24 kg/m.  - No change to current plan.   7. Hyperkalemia - Much improved with CVVHD.  8. Acute hypoxemic respiratory failure - Extubated 11/14/17. Stable.  9. Thrombocytopenia - Platelets dropped with heparin. Switched to apixaban. HIT panel sent. PLTs 73k. No bleeding. Discussed dosing with 10. ID - On zosyn for possible aspiration post-code. Remains AF. WBC decreasing. Cx negative. Will stop abx today.  Can go to tele  Length of Stay: 12  Arvilla Meres, MD  12:43 PM   Advanced Heart Failure Team Pager [phone] (M-F; 7a - 4p)  Please contact [facility] for night-coverage after hours (4p -7a ) and weekends on [email or web address]

## 2017-11-16 NOTE — Progress Notes (Signed)
[facility] NEPHROLOGY PROGRESS NOTE  Assessment/ Plan: Pt is a 57 y.o. yo male with chronic kidney disease stage IV, admitted with  congestive heart failure exacerbation and worsening renal failure.  Placement of right IJ tunnel catheter and AV fistula on 11/12/17 by VVS.  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  Placement of right upper extremity AV fistula and right IJ tunneled catheterby VVS on 4/22.  Patient had a cardiac arrest on 4/22 and transferred to ICU.  Started on CRRT.  Discontinue CRRT and plan for intermittent hemodialysis tomorrow.  No shortness of breath or chest pain today.  Potassium level acceptable.  Able to do increased ultrafiltration during CRRT.  #Hyperkalemia: improved. Monitor BMP.  #Acute respiratory failure with hypoxia: extubated.  Currently on 2-3 L of oxygen.  #CHF exacerbation with volume overload: Improving with dialysis.  #Hypotension improved.  Off pressor now.  #Anemia of CKD: Hemoglobin 12.1.  Iron saturation 17% with a serum iron of 78.  Ordered a dose of Feraheme 510 mg IV on 4/22.  #Secondary hyperparathyroidism:  Phosphorus 5.3.  I will start PhosLo.  #Nonischemic cardiomyopathy status post ICD: EF of 25-30%.  #A. fib/flutter: On amiodarone  Subjective: Seen and examined in ICU.  Tolerated CRRT well.  No chest pain, shortness of breath.  Family at bedside.  Objective Vital signs in last 24 hours: Vitals:   11/16/17 0600 11/16/17 0700 11/16/17 0806 11/16/17 0810  BP:  118/75    Pulse:      Resp:  (!) 22    Temp:    (!) 97.5 F (36.4 C)  TempSrc:    Oral  SpO2:  100% 100%   Weight: 125.4 kg (276 lb 7.3 oz)     Height:       Weight change: -3.8 kg (-8 lb 6 oz)  Intake/Output Summary (Last 24 hours) at 11/16/2017 0854 Last data filed at 11/16/2017 0700 Gross per 24 hour  Intake 1687 ml  Output 7096 ml  Net -5409 ml       Labs: Basic Metabolic Panel: Recent  Labs  Lab 11/15/17 0341 11/15/17 1616 11/15/17 2340 11/16/17 0359  NA 136 134*  --  133*  K 4.5 4.9 4.3 4.3  CL 101 98*  --  94*  CO2 25 24  --  24  GLUCOSE 93 106*  --  106*  BUN 33* 32*  --  27*  CREATININE 3.10* 3.29*  --  3.75*  CALCIUM 8.8* 8.7*  --  8.9  PHOS 5.1* 4.7*  --  5.3*   Liver Function Tests: Recent Labs  Lab 11/12/17 1929  11/15/17 0341 11/15/17 1616 11/16/17 0359  AST 77*  --  125*  --  90*  ALT 50  --  148*  --  136*  ALKPHOS 182*  --  171*  --  189*  BILITOT 2.1*  --  2.0*  --  2.1*  PROT 7.0  --  8.4*  --  9.2*  ALBUMIN 3.3*   < > 3.6 3.7 3.7   < > = values in this interval not displayed.   No results for input(s): LIPASE, AMYLASE in the last 168 hours. No results for input(s): AMMONIA in the last 168 hours. CBC: Recent Labs  Lab 11/10/17 0558  11/12/17 1104 11/12/17 1929  11/14/17 0252 11/15/17 0341 11/16/17 0359  WBC 13.6*   < > 11.5* 11.9*  --  24.9* 21.6* 24.7*  NEUTROABS 10.1*  --   --   --   --   --  18.1* 20.4*  HGB 10.4*   < > 11.7* 11.6*   < > 11.7* 11.8* 12.1*  HCT 35.5*   < > 39.7 40.2   < > 38.2* 40.3 41.8  MCV 89.0   < > 89.2 93.3  --  85.8 89.0 89.7  PLT 241   < > 176 156  --  125* 90* 85*   < > = values in this interval not displayed.   Cardiac Enzymes: No results for input(s): CKTOTAL, CKMB, CKMBINDEX, TROPONINI in the last 168 hours. CBG: Recent Labs  Lab 11/15/17 1621 11/15/17 1922 11/15/17 2338 11/16/17 0402 11/16/17 0809  GLUCAP 103* 113* 103* 99 92    Iron Studies:  No results for input(s): IRON, TIBC, TRANSFERRIN, FERRITIN in the last 72 hours. Studies/Results: Dg Chest 1 View  Result Date: 11/15/2017 CLINICAL DATA:  Shortness of breath. EXAM: CHEST  1 VIEW COMPARISON:  11/14/2017. FINDINGS: Interim extubation removal of NG tube. Right stable cardiomegaly. Low lung volumes with mild bibasilar atelectasis/infiltrates. No pleural effusion or pneumothorax. No acute bony abnormality. IMPRESSION: 1. Interim  extubation removal of NG tube. Right IJ line in stable position. 2.  Cardiac pacer stable position.  Stable cardiomegaly. 3. Mild bibasilar atelectasis/infiltrates. No interim change. No definite effusions noted on today's exam. Electronically Signed   By: Maisie Fus  Register   On: 11/15/2017 09:57    Medications: Infusions: . sodium chloride Stopped (11/14/17 0800)  . sodium chloride    . sodium chloride    . calcium gluconate    . famotidine (PEPCID) IV Stopped (11/15/17 1434)  . ferumoxytol    . heparin 1,200 Units/hr (11/16/17 0800)  . piperacillin-tazobactam Stopped (11/16/17 0549)  . sodium chloride      Scheduled Medications: . allopurinol  100 mg Per Tube BID  . amiodarone  200 mg Per Tube Daily  . chlorhexidine  15 mL Mouth Rinse BID  . Chlorhexidine Gluconate Cloth  6 each Topical Daily  . insulin aspart  1-3 Units Subcutaneous Q4H  . ipratropium-albuterol  3 mL Nebulization Q6H  . mouth rinse  15 mL Mouth Rinse q12n4p  . sodium chloride flush  3 mL Intravenous Q12H    have reviewed scheduled and prn medications.  Physical Exam: General: Lying in bed comfortable, not in distress Heart: Regular rate rhythm S1-S2 normal.  No rubs Lungs: Distant breath sound Abdomen: Mild distention, soft. extremities: Bilateral lower extremities pitting and chronic edema Dialysis Access: Right IJ tunnel catheter site looks clean.  Right AVF site has good thrill  Dron Prasad Bhandari 11/16/2017,8:54 AM  LOS: 12 days

## 2017-11-16 NOTE — Progress Notes (Signed)
ANTICOAGULATION CONSULT NOTE - Follow Up Consult  Pharmacy Consult for heparin Indication: atrial fibrillation  Allergies  Allergen Reactions  . Nsaids Other (See Comments)    Cannot take to due kidney issues  . Beta Adrenergic Blockers Other (See Comments)    "An unnamed, white-colored Beta Blocker made" him "feel funny" = made him feel cagey    Patient Measurements: Height: 5' 9.5" (176.5 cm) Weight: 276 lb 7.3 oz (125.4 kg) IBW/kg (Calculated) : 71.85 Heparin Dosing Weight: 104.9 kg  Vital Signs: Temp: 98 F (36.7 C) (04/26 0400) Temp Source: Oral (04/26 0400) BP: 118/75 (04/26 0700) Pulse Rate: 86 (04/26 0400)  Labs: Recent Labs    11/14/17 0252 11/14/17 1300  11/15/17 0341 11/15/17 1616 11/16/17 0359  HGB 11.7*  --   --  11.8*  --  12.1*  HCT 38.2*  --   --  40.3  --  41.8  PLT 125*  --   --  90*  --  85*  APTT 33  --   --  58*  --  58*  LABPROT  --   --   --  16.6*  --   --   INR  --   --   --  1.35  --   --   HEPARINUNFRC 0.73* 0.48  --  0.36  --  0.30  CREATININE 3.03*  --    < > 3.10* 3.29* 3.75*   < > = values in this interval not displayed.    Estimated Creatinine Clearance: 28.7 mL/min (A) (by C-G formula based on SCr of 3.75 mg/dL (H)).  Assessment: 57 YO male admitted with a flutter and s/p cardiac arrest on 4/22 due to hyperkalemia. Was on apixaban 5mg  BID PTA for Afib but switched to heparin due to CVVHD. Plan to switch to iHD today. Will restart apixaban at some point.  Heparin just therapeutic at 0.30. Hgb 12.1, Plt 85. No bleeding reported.   Goal of Therapy:  Heparin level 0.3-0.7 units/ml Monitor platelets by anticoagulation protocol: Yes   Plan:  Increase heparin to 1200 units/hr Continue to monitor H&H and platelets  Daily heparin level.  Monitor s/sx of bleeding.   Emeline General, PharmD Candidate  11/16/2017,7:31 AM

## 2017-11-16 NOTE — Progress Notes (Signed)
Pharmacy Antibiotic Note  Eric Murillo is a 57 y.o. male admitted on 11/03/2017 with sepsis.  Pharmacy has been consulted for vancomycin/Zosyn dosing. Patient has been on CRRT, with plan to get first iHD tomorrow. WBC 24.7 (up from 21.6), Scr 3.75, Afebrile. Random vanc trough today 23-checked because of variable CRRT rates.  Plan: -Will hold vancomycin today given vanc trough of 23.  -Vanc dose once patient starts iHD will be 1000mg . Will order tomorrow.  -Adjust Zosyn to 3.375g IV q12h (Previously Q8h) Monitor for Scr, c/s, clinical resolution. F/u de-escalation plan/LOT, vancomycin trough as indicated F/u HD schedule.    Height: 5' 9.5" (176.5 cm) Weight: 276 lb 7.3 oz (125.4 kg) IBW/kg (Calculated) : 71.85  Temp (24hrs), Avg:97.6 F (36.4 C), Min:97.3 F (36.3 C), Max:98 F (36.7 C)  Recent Labs  Lab 11/12/17 1104 11/12/17 1929 11/12/17 1936 11/13/17 0055 11/13/17 0400  11/14/17 0252 11/14/17 1557 11/15/17 0341 11/15/17 1616 11/16/17 0359 11/16/17 0415  WBC 11.5* 11.9*  --   --   --   --  24.9*  --  21.6*  --  24.7*  --   CREATININE 4.31* 5.66*  --  4.75* 4.16*   < > 3.03* 2.94* 3.10* 3.29* 3.75*  --   LATICACIDVEN  --   --  5.7* 1.9 1.8  --   --   --   --   --   --   --   VANCORANDOM  --   --   --   --   --   --   --   --   --   --   --  23   < > = values in this interval not displayed.    Estimated Creatinine Clearance: 28.7 mL/min (A) (by C-G formula based on SCr of 3.75 mg/dL (H)).    Allergies  Allergen Reactions  . Nsaids Other (See Comments)    Cannot take to due kidney issues  . Beta Adrenergic Blockers Other (See Comments)    "An unnamed, white-colored Beta Blocker made" him "feel funny" = made him feel cagey    Antimicrobials this admission: Vanc 4/24 >>  Zosyn 4/24 >>    Microbiology results: 4/24 BCx: NGTD   4/18 MRSA PCR: Negative  Thank you for allowing pharmacy to be a part of this patient's care.  Emeline General, PharmD  Candidate 11/16/2017 9:04 AM

## 2017-11-16 NOTE — Progress Notes (Signed)
OT Cancellation Note  Patient Details Name: Eric Murillo MRN: [id number] DOB: 04-04-1961   Cancelled Treatment:    Reason Eval/Treat Not Completed: Patient at procedure or test/ unavailable(at [facility]). OT will continue to follow for eval  Evern Bio [facility] 11/16/2017, 8:03 AM

## 2017-11-16 NOTE — Progress Notes (Signed)
Patient refused BiPAP tonight despite education. Will continue to monitor patient.

## 2017-11-16 NOTE — Plan of Care (Signed)
  Problem: Activity: Goal: Capacity to carry out activities will improve Outcome: Not Progressing (patient intermittently confused and trying to get out of bed)- able to be redirected

## 2017-11-17 LAB — RENAL FUNCTION PANEL
ANION GAP: 13 (ref 5–15)
Albumin: 3.4 g/dL — ABNORMAL LOW (ref 3.5–5.0)
BUN: 52 mg/dL — ABNORMAL HIGH (ref 6–20)
CO2: 24 mmol/L (ref 22–32)
CREATININE: 6.67 mg/dL — AB (ref 0.61–1.24)
Calcium: 8.9 mg/dL (ref 8.9–10.3)
Chloride: 96 mmol/L — ABNORMAL LOW (ref 101–111)
GFR, EST AFRICAN AMERICAN: 10 mL/min — AB (ref >60)
GFR, EST NON AFRICAN AMERICAN: 8 mL/min — AB (ref >60)
Glucose, Bld: 101 mg/dL — ABNORMAL HIGH (ref 65–99)
POTASSIUM: 4.7 mmol/L (ref 3.5–5.1)
Phosphorus: 6.7 mg/dL — ABNORMAL HIGH (ref 2.5–4.6)
Sodium: 133 mmol/L — ABNORMAL LOW (ref 135–145)

## 2017-11-17 LAB — BLOOD GAS, ARTERIAL
Acid-base deficit: 1 mmol/L (ref 0.0–2.0)
Bicarbonate: 25.3 mmol/L (ref 20.0–28.0)
O2 Content: 2 L/min
O2 Saturation: 98 %
PATIENT TEMPERATURE: 98.6
PH ART: 7.25 — AB (ref 7.350–7.450)
pCO2 arterial: 59.8 mmHg — ABNORMAL HIGH (ref 32.0–48.0)
pO2, Arterial: 114 mmHg — ABNORMAL HIGH (ref 83.0–108.0)

## 2017-11-17 LAB — GLUCOSE, CAPILLARY
GLUCOSE-CAPILLARY: 100 mg/dL — AB (ref 65–99)
GLUCOSE-CAPILLARY: 101 mg/dL — AB (ref 65–99)
GLUCOSE-CAPILLARY: 99 mg/dL (ref 65–99)
Glucose-Capillary: 94 mg/dL (ref 65–99)
Glucose-Capillary: 115 mg/dL — ABNORMAL HIGH (ref 65–99)

## 2017-11-17 LAB — CBC
HCT: 40.9 % (ref 39.0–52.0)
HEMATOCRIT: 39.2 % (ref 39.0–52.0)
HEMOGLOBIN: 11.6 g/dL — AB (ref 13.0–17.0)
HEMOGLOBIN: 12.2 g/dL — AB (ref 13.0–17.0)
MCH: 26.1 pg (ref 26.0–34.0)
MCH: 26.3 pg (ref 26.0–34.0)
MCHC: 29.6 g/dL — ABNORMAL LOW (ref 30.0–36.0)
MCHC: 29.8 g/dL — ABNORMAL LOW (ref 30.0–36.0)
MCV: 88.3 fL (ref 78.0–100.0)
MCV: 88.1 fL (ref 78.0–100.0)
Platelets: 73 10*3/uL — ABNORMAL LOW (ref 150–400)
Platelets: 67 10*3/uL — ABNORMAL LOW (ref 150–400)
RBC: 4.44 MIL/uL (ref 4.22–5.81)
RBC: 4.64 MIL/uL (ref 4.22–5.81)
RDW: 16 % — ABNORMAL HIGH (ref 11.5–15.5)
RDW: 16.2 % — ABNORMAL HIGH (ref 11.5–15.5)
WBC: 23.1 10*3/uL — ABNORMAL HIGH (ref 4.0–10.5)
WBC: 20.3 10*3/uL — ABNORMAL HIGH (ref 4.0–10.5)

## 2017-11-17 LAB — MAGNESIUM: MAGNESIUM: 3.2 mg/dL — AB (ref 1.7–2.4)

## 2017-11-17 MED ORDER — VANCOMYCIN HCL IN DEXTROSE 1-5 GM/200ML-% IV SOLN
1000.0000 mg | Freq: Once | INTRAVENOUS | Status: AC
  Administered 2017-11-17: 1 g via INTRAVENOUS
  Filled 2017-11-17: qty 200

## 2017-11-17 MED ORDER — ALTEPLASE 2 MG IJ SOLR: 2.0000 mg | Freq: Once | INTRAMUSCULAR | Status: DC | PRN

## 2017-11-17 MED ORDER — AMIODARONE HCL 200 MG PO TABS
200.0000 mg | ORAL_TABLET | Freq: Every day | ORAL | Status: DC
  Administered 2017-11-17 – 2017-11-22 (×6): 200 mg via ORAL
  Filled 2017-11-17 (×5): qty 1

## 2017-11-17 MED ORDER — ALBUMIN HUMAN 25 % IV SOLN
25.0000 g | Freq: Once | INTRAVENOUS | Status: DC | PRN
  Filled 2017-11-17: qty 100

## 2017-11-17 MED ORDER — VANCOMYCIN HCL IN DEXTROSE 1-5 GM/200ML-% IV SOLN
INTRAVENOUS | Status: AC
  Administered 2017-11-17: 1 g via INTRAVENOUS
  Filled 2017-11-17: qty 200

## 2017-11-17 MED ORDER — SODIUM CHLORIDE 0.9 % IV SOLN: 100.0000 mL | INTRAVENOUS | Status: DC | PRN

## 2017-11-17 MED ORDER — PENTAFLUOROPROP-TETRAFLUOROETH EX AERO: 1.0000 "application " | INHALATION_SPRAY | CUTANEOUS | Status: DC | PRN

## 2017-11-17 MED ORDER — BISACODYL 5 MG PO TBEC: 5.0000 mg | DELAYED_RELEASE_TABLET | Freq: Every day | ORAL | Status: DC | PRN

## 2017-11-17 MED ORDER — ALLOPURINOL 100 MG PO TABS
100.0000 mg | ORAL_TABLET | Freq: Two times a day (BID) | ORAL | Status: DC
  Administered 2017-11-17 – 2017-11-22 (×10): 100 mg via ORAL
  Filled 2017-11-17 (×10): qty 1

## 2017-11-17 MED ORDER — LIDOCAINE-PRILOCAINE 2.5-2.5 % EX CREA: 1.0000 "application " | TOPICAL_CREAM | CUTANEOUS | Status: DC | PRN

## 2017-11-17 MED ORDER — HEPARIN SODIUM (PORCINE) 1000 UNIT/ML DIALYSIS
1000.0000 [IU] | INTRAMUSCULAR | Status: DC | PRN
  Filled 2017-11-17: qty 1

## 2017-11-17 MED ORDER — ALBUMIN HUMAN 25 % IV SOLN
INTRAVENOUS | Status: AC
  Filled 2017-11-17: qty 100

## 2017-11-17 MED ORDER — LIDOCAINE HCL (PF) 1 % IJ SOLN: 5.0000 mL | INTRAMUSCULAR | Status: DC | PRN

## 2017-11-17 NOTE — Progress Notes (Signed)
Arrived to patient room 2H-10 at 0920.  Reviewed treatment plan and this RN agrees with plan.  Report received from bedside RN, Kenney Houseman.  Consent obtained.  Patient A & O X 4.   Lung sounds diminished and clear to ausculation in all fields. Generalized non pitting edema. Cardiac:  NSR.  Removed caps and cleansed RIJ catheter with chlorhedxidine.  Aspirated ports of heparin and flushed them with saline per protocol.  Connected and secured lines, initiated treatment at 0940.  UF Goal of 3500 mL and net fluid removal 3 L.  Will continue to monitor.

## 2017-11-17 NOTE — Progress Notes (Signed)
PULMONARY / CRITICAL CARE MEDICINE   Name: Eric Murillo MRN: [id number] DOB: 06/18/1961    ADMISSION DATE:  11/03/2017 CONSULTATION DATE: 11/12/2017  REFERRING MD:  Jearld Pies  CHIEF COMPLAINT:  S/p Cardiac arrest  HISTORY OF PRESENT ILLNESS:   This is a 57 year old male with a past medical history of A. fib on Eliquis, systolic heart failure with EF of 20-25%, CKD stage IV, obstructive sleep apnea currently not being treated, morbid obesity presented on November 03, 2017 with flulike symptoms. He was diagnosed with acute on chronic heart failure and worsening AK I. Diuresis did not resolve his symptoms and he had a dialysis catheter placed on April 18 for hemodialysis beginning April 19. He is status post right upper extremity fistula placement on 11/12/2017. However, post-procedure he had worsening mental status, hypoxia and a CP arrest with 10 minutes of ACLS with multiple defibrillations due to wide-complex tachycardia. Patient was intubated and transferred to the ICU. He has since been extubated on 4/24 but because of hypercapnea and untreated OSA, has been on BiPAP hs ands prn.  PAST MEDICAL HISTORY :  He  has a past medical history of AICD (automatic cardioverter/defibrillator) present, Anemia of chronic disease, Arthritis, Atrial flutter (HCC), Chest pain on exertion, Chronic systolic congestive heart failure, NYHA class 2 (HCC), CKD (chronic kidney disease) stage 3, GFR 30-59 ml/min (HCC), Dyslipidemia, Gout, Hyperglycemia, Hyperlipidemia, Hypertension, Morbid obesity with BMI of 45.0-49.9, adult (HCC), Non-ischemic cardiomyopathy (HCC), Organic erectile dysfunction, OSA (obstructive sleep apnea), Pilonidal cyst, and Submandibular sialolithiasis.  PAST SURGICAL HISTORY: He  has a past surgical history that includes Colonoscopy; Salivary gland surgery (09/12/2012); Cardiac catheterization (2012); Submandibular gland excision (Right, 09/12/2012); TEE without cardioversion (N/A, 04/03/2014);  Cardioversion (N/A, 04/03/2014); left and right heart catheterization with coronary angiogram (N/A, 04/24/2014); Cardiac catheterization (N/A, 05/21/2015); Cardioversion (N/A, 03/09/2016); TEE without cardioversion (N/A, 03/09/2016); TEE without cardioversion (N/A, 06/06/2017); Cardioversion (N/A, 06/06/2017); IR Fluoro Guide CV Line Right (11/08/2017); IR US Guide Vasc Access Right (11/08/2017); Insertion of dialysis catheter (Right, 11/12/2017); and AV fistula placement (Right, 11/12/2017).  Allergies  Allergen Reactions  . Nsaids Other (See Comments)    Cannot take to due kidney issues  . Beta Adrenergic Blockers Other (See Comments)    "An unnamed, white-colored Beta Blocker made" him "feel funny" = made him feel cagey    No current facility-administered medications on file prior to encounter.    Current Outpatient Medications on File Prior to Encounter  Medication Sig  . acetaminophen (TYLENOL) 500 MG tablet Take 2 tablets (1,000 mg total) by mouth every 8 (eight) hours as needed. (Patient taking differently: Take 1,000 mg every 8 (eight) hours as needed by mouth (for pain or headaches). )  . allopurinol (ZYLOPRIM) 100 MG tablet TAKE 1 TABLET BY MOUTH TWO TIMES DAILY  . amiodarone (PACERONE) 200 MG tablet Take 1 tablet (200 mg total) by mouth daily. TAKE 2 TABLETS DAILY UNTIL 06/14/2017, and then resume 1 daily (Patient taking differently: Take 200 mg by mouth daily. )  . carvedilol (COREG) 6.25 MG tablet Take 1 tablet (6.25 mg total) 2 (two) times daily with a meal by mouth.  . colchicine 0.6 MG tablet Take 0.6 mg 2 (two) times daily as needed by mouth (FOR GOUT FLARES). Reported on 01/18/2016  . diclofenac sodium (VOLTAREN) 1 % GEL Apply 4 g topically 4 (four) times daily.  Marland Kitchen ELIQUIS 5 MG TABS tablet TAKE ONE TABLET BY MOUTH TWICE DAILY  . isosorbide-hydrALAZINE (BIDIL) 20-37.5 MG tablet Take 1 tablet 3 (  three) times daily by mouth.  . metolazone (ZAROXOLYN) 2.5 MG tablet Take 1 tablet (2.5 mg  total) by mouth once a week. Every Wednesday, take extra as directed  . potassium chloride SA (KLOR-CON M20) 20 MEQ tablet Take 2 tablets (40 mEq total) by mouth 3 (three) times daily.  Marland Kitchen torsemide (DEMADEX) 20 MG tablet Take 5 tablets (100 mg total) by mouth 2 (two) times daily.    FAMILY HISTORY:  His indicated that his mother is alive. He indicated that his father is deceased. He indicated that his sister is alive. He indicated that his brother is alive. He indicated that his maternal grandmother is deceased. He indicated that his maternal grandfather is deceased. He indicated that his paternal grandmother is deceased. He indicated that his paternal grandfather is deceased. He indicated that the status of his maternal aunt is unknown. He indicated that the status of his neg hx is unknown.   SOCIAL HISTORY: He  reports that he has never smoked. He has never used smokeless tobacco. He reports that he does not drink alcohol or use drugs.  SUBJECTIVE:  Refuses to wear BiPAP at night. Otherwise he voices no c/o.  VITAL SIGNS: BP (!) 110/56   Pulse 81   Temp 98 F (36.7 C)   Resp (!) 22   Ht 5' 9.5" (1.765 m)   Wt 121 kg (266 lb 12.1 oz)   SpO2 97%   BMI 38.83 kg/m   INTAKE / OUTPUT: I/O last 3 completed shifts: In: 834.4 [P.O.:355; I.V.:329.4; IV Piggyback:150] Out: 3317 [Other:3317]  PHYSICAL EXAMINATION: General:  WD/WN NAD Neuro:  A&O. Moves all 4s HEENT:  [place]/AT, PRRL EOMI, OP neg Cardiovascular:  Irreg, No m/r/g Lungs:  Clear ant and laterally Abdomen:  Supple, NT Musculoskeletal:  Tender with pressure over shins Skin:  Tr edema  LABS:  BMET Recent Labs  Lab 11/15/17 1616 11/15/17 2340 11/16/17 0359 11/17/17 0335  NA 134*  --  133* 133*  K 4.9 4.3 4.3 4.7  CL 98*  --  94* 96*  CO2 24  --  24 24  BUN 32*  --  27* 52*  CREATININE 3.29*  --  3.75* 6.67*  GLUCOSE 106*  --  106* 101*    Electrolytes Recent Labs  Lab 11/15/17 0341 11/15/17 1616  11/16/17 0359 11/17/17 0335  CALCIUM 8.8* 8.7* 8.9 8.9  MG 2.7*  --  3.0* 3.2*  PHOS 5.1* 4.7* 5.3* 6.7*    CBC Recent Labs  Lab 11/16/17 0359 11/17/17 0335 11/17/17 1050  WBC 24.7* 23.1* 20.3*  HGB 12.1* 11.6* 12.2*  HCT 41.8 39.2 40.9  PLT 85* 73* 67*    Coag's Recent Labs  Lab 11/12/17 1104 11/12/17 1929  11/14/17 0252 11/15/17 0341 11/16/17 0359  APTT  --   --    < > 33 58* 58*  INR 1.53 1.70  --   --  1.35  --    < > = values in this interval not displayed.    Sepsis Markers Recent Labs  Lab 11/12/17 1929 11/12/17 1936 11/13/17 0055 11/13/17 0400  LATICACIDVEN  --  5.7* 1.9 1.8  PROCALCITON 1.30  --   --   --     ABG Recent Labs  Lab 11/16/17 0405 11/16/17 1411 11/17/17 0340  PHART 7.256* 7.275* 7.250*  PCO2ART 61.4* 58.2* 59.8*  PO2ART 187* 82.0* 114*    Liver Enzymes Recent Labs  Lab 11/12/17 1929  11/15/17 0341 11/15/17 1616 11/16/17 0359 11/17/17 0335  AST 77*  --  125*  --  90*  --   ALT 50  --  148*  --  136*  --   ALKPHOS 182*  --  171*  --  189*  --   BILITOT 2.1*  --  2.0*  --  2.1*  --   ALBUMIN 3.3*   < > 3.6 3.7 3.7 3.4*   < > = values in this interval not displayed.    Cardiac Enzymes No results for input(s): TROPONINI, PROBNP in the last 168 hours.  Glucose Recent Labs  Lab 11/16/17 1950 11/16/17 2325 11/17/17 0338 11/17/17 0736 11/17/17 1152 11/17/17 1551  GLUCAP 97 104* 100* 101* 94 99    Imaging No results found.   STUDIES:  No recent  CULTURES: Neg  ANTIBIOTICS: Zosyn to be d/c'd today  LINES/TUBES: L art line - to be d/c'd today  DISCUSSION: 57 y.o. M S/P wide-complex tachyacardia with CP-arrest. Denies prior tobacco hx or occupational dust/chemical exposure but has OSA with hypercapnea. Patient refuses BiPAP. Patient to be tx from ICU to telemetry. Will therefore sign off.  ASSESSMENT / PLAN:  PULMONARY A: Hypercapneic respiratory failure P:   Continue to encourage BiPAP use. 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

## 2017-11-17 NOTE — Progress Notes (Signed)
ANTICOAGULATION CONSULT NOTE - Follow Up Consult  Pharmacy Consult for heparin > apixaban Indication: atrial fibrillation  Allergies  Allergen Reactions  . Nsaids Other (See Comments)    Cannot take to due kidney issues  . Beta Adrenergic Blockers Other (See Comments)    "An unnamed, white-colored Beta Blocker made" him "feel funny" = made him feel cagey    Patient Measurements: Height: 5' 9.5" (176.5 cm) Weight: 272 lb 4.3 oz (123.5 kg) IBW/kg (Calculated) : 71.85 Heparin Dosing Weight: 104.9 kg  Vital Signs: Temp: 98 F (36.7 C) (04/27 1152) Temp Source: Oral (04/27 1152) BP: 103/51 (04/27 1200) Pulse Rate: 86 (04/27 1200)  Labs: Recent Labs    11/14/17 1300  11/15/17 0341 11/15/17 1616 11/16/17 0359 11/17/17 0335 11/17/17 1050  HGB  --    < > 11.8*  --  12.1* 11.6* 12.2*  HCT  --    < > 40.3  --  41.8 39.2 40.9  PLT  --    < > 90*  --  85* 73* 67*  APTT  --   --  58*  --  58*  --   --   LABPROT  --   --  16.6*  --   --   --   --   INR  --   --  1.35  --   --   --   --   HEPARINUNFRC 0.48  --  0.36  --  0.30  --   --   CREATININE  --    < > 3.10* 3.29* 3.75* 6.67*  --    < > = values in this interval not displayed.    Estimated Creatinine Clearance: 16 mL/min (A) (by C-G formula based on SCr of 6.67 mg/dL (H)).  Assessment: 57 YO male admitted with A- flutter and s/p cardiac arrest on 4/22 due to hyperkalemia. Was on apixaban 5mg  BID PTA for Afib but switched to heparin due to CVVHD > iHD 4/27 stop heparin and change to apixaban.  Wt > 60kg, Age < 80 H/h stable  Low pltc watch  Check HIT to be sure    Goal of Therapy:  Heparin level 0.3-0.7 units/ml Monitor platelets by anticoagulation protocol: Yes   Plan:  Stop heparin Apixaban 5mg  BID Watch s/s bleeding F/u pltc and HIT labs   [employer] Pharm.D. CPP, BCPS Clinical Pharmacist [phone] 11/17/2017 12:12 PM

## 2017-11-17 NOTE — Progress Notes (Signed)
[facility] NEPHROLOGY PROGRESS NOTE  Assessment/ Plan: Pt is a 57 y.o. yo male with chronic kidney disease stage IV, admitted with  congestive heart failure exacerbation and worsening renal failure.  Placement of right IJ tunnel catheter and AV fistula on 11/12/17 by VVS.  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  Placement of right upper extremity AV fistula and right IJ tunneled catheterby VVS on 4/22.  Patient had a cardiac arrest on 4/22 and transferred to ICU.  Started on CRRT from 4/22-4/26.  Plan for dialysis today.  2K bath with about 3 L ultrafiltration as tolerated during dialysis.  Discussed with the patient and family at bedside.    #Hyperkalemia: improved. Monitor BMP.  #Acute respiratory failure with hypoxia: extubated.  Currently on 2-3 L of oxygen.  #CHF exacerbation with volume overload: Improving with dialysis.  #Hypotension improved.  Off pressor now.  #Anemia of CKD: Hemoglobin acceptable.  Iron saturation 17% with a serum iron of 78.  Ordered a dose of Feraheme 510 mg IV on 4/22.  #Secondary hyperparathyroidism:  Phosphorus elevated.  Started PhosLo with food.Marland Kitchen  #Nonischemic cardiomyopathy status post ICD: EF of 25-30%.  #A. fib/flutter: On amiodarone  Subjective: Seen and examined in ICU.  No chest pain, shortness of breath, nausea vomiting.  Requiring oxygen.  Family at bedside Objective Vital signs in last 24 hours: Vitals:   11/17/17 0600 11/17/17 0700 11/17/17 0737 11/17/17 0800  BP:      Pulse:      Resp: (!) 22 (!) 22  13  Temp:   97.7 F (36.5 C)   TempSrc:   Oral   SpO2: 95% 96%  96%  Weight: 123.5 kg (272 lb 4.3 oz)     Height:       Weight change: -1.9 kg (-4 lb 3 oz)  Intake/Output Summary (Last 24 hours) at 11/17/2017 0836 Last data filed at 11/17/2017 0800 Gross per 24 hour  Intake 356.4 ml  Output -  Net 356.4 ml       Labs: Basic Metabolic Panel: Recent Labs   Lab 11/15/17 1616 11/15/17 2340 11/16/17 0359 11/17/17 0335  NA 134*  --  133* 133*  K 4.9 4.3 4.3 4.7  CL 98*  --  94* 96*  CO2 24  --  24 24  GLUCOSE 106*  --  106* 101*  BUN 32*  --  27* 52*  CREATININE 3.29*  --  3.75* 6.67*  CALCIUM 8.7*  --  8.9 8.9  PHOS 4.7*  --  5.3* 6.7*   Liver Function Tests: Recent Labs  Lab 11/12/17 1929  11/15/17 0341 11/15/17 1616 11/16/17 0359 11/17/17 0335  AST 77*  --  125*  --  90*  --   ALT 50  --  148*  --  136*  --   ALKPHOS 182*  --  171*  --  189*  --   BILITOT 2.1*  --  2.0*  --  2.1*  --   PROT 7.0  --  8.4*  --  9.2*  --   ALBUMIN 3.3*   < > 3.6 3.7 3.7 3.4*   < > = values in this interval not displayed.   No results for input(s): LIPASE, AMYLASE in the last 168 hours. No results for input(s): AMMONIA in the last 168 hours. CBC: Recent Labs  Lab 11/12/17 1929  11/14/17 0252 11/15/17 0341 11/16/17 0359 11/17/17 0335  WBC 11.9*  --  24.9* 21.6* 24.7* 23.1*  NEUTROABS  --   --   --  18.1* 20.4*  --   HGB 11.6*   < > 11.7* 11.8* 12.1* 11.6*  HCT 40.2   < > 38.2* 40.3 41.8 39.2  MCV 93.3  --  85.8 89.0 89.7 88.3  PLT 156  --  125* 90* 85* 73*   < > = values in this interval not displayed.   Cardiac Enzymes: No results for input(s): CKTOTAL, CKMB, CKMBINDEX, TROPONINI in the last 168 hours. CBG: Recent Labs  Lab 11/16/17 1646 11/16/17 1950 11/16/17 2325 11/17/17 0338 11/17/17 0736  GLUCAP 108* 97 104* 100* 101*    Iron Studies:  No results for input(s): IRON, TIBC, TRANSFERRIN, FERRITIN in the last 72 hours. Studies/Results: No results found.  Medications: Infusions: . sodium chloride Stopped (11/14/17 0800)  . sodium chloride    . sodium chloride    . calcium gluconate    . ferumoxytol    . piperacillin-tazobactam (ZOSYN)  IV Stopped (11/16/17 2141)  . sodium chloride      Scheduled Medications: . allopurinol  100 mg Per Tube BID  . amiodarone  200 mg Per Tube Daily  . apixaban  5 mg Oral BID   . calcium acetate  667 mg Oral TID WC  . chlorhexidine  15 mL Mouth Rinse BID  . Chlorhexidine Gluconate Cloth  6 each Topical Daily  . feeding supplement (NEPRO CARB STEADY)  237 mL Oral BID BM  . insulin aspart  1-3 Units Subcutaneous Q4H  . mouth rinse  15 mL Mouth Rinse q12n4p  . sodium chloride flush  3 mL Intravenous Q12H    have reviewed scheduled and prn medications.  Physical Exam: General: Not in distress Heart: Regular rate rhythm S1-S2 normal.  No rubs Lungs: Distant breath sound Abdomen: Mild distention, soft. extremities: Bilateral lower extremities pitting and chronic edema Dialysis Access: Right IJ tunnel catheter site looks clean.  Right AVF site has good thrill  Eaton Folmar Prasad Ferdinand Revoir 11/17/2017,8:36 AM  LOS: 13 days

## 2017-11-17 NOTE — Progress Notes (Signed)
Pharmacy Antibiotic Note  Eric Murillo is a 57 y.o. male admitted on 11/03/2017 with sepsis.  Pharmacy has been consulted for vancomycin and zosyn dosing. Patient was on CVVHD rate varying from 200-350.  CVVHD > iHD will adjust antibiotics   Plan: Vancomycin 1gm x1 today with HD Zosyn 3.375gm q12h   Height: 5' 9.5" (176.5 cm) Weight: 272 lb 4.3 oz (123.5 kg) IBW/kg (Calculated) : 71.85  Temp (24hrs), Avg:98 F (36.7 C), Min:97.7 F (36.5 C), Max:98.3 F (36.8 C)  Recent Labs  Lab 11/12/17 1936 11/13/17 0055 11/13/17 0400  11/14/17 0252 11/14/17 1557 11/15/17 0341 11/15/17 1616 11/16/17 0359 11/16/17 0415 11/17/17 0335 11/17/17 1050  WBC  --   --   --   --  24.9*  --  21.6*  --  24.7*  --  23.1* 20.3*  CREATININE  --  4.75* 4.16*   < > 3.03* 2.94* 3.10* 3.29* 3.75*  --  6.67*  --   LATICACIDVEN 5.7* 1.9 1.8  --   --   --   --   --   --   --   --   --   VANCORANDOM  --   --   --   --   --   --   --   --   --  23  --   --    < > = values in this interval not displayed.    Estimated Creatinine Clearance: 16 mL/min (A) (by C-G formula based on SCr of 6.67 mg/dL (H)).    Allergies  Allergen Reactions  . Nsaids Other (See Comments)    Cannot take to due kidney issues  . Beta Adrenergic Blockers Other (See Comments)    "An unnamed, white-colored Beta Blocker made" him "feel funny" = made him feel cagey    Antimicrobials this admission: vanco 4/24 >>  zosyn 4/24 >>   Microbiology results: 4/24 BCx: NGTD 4/18 MRSA PCR: NEGATIVE  Leota Sauers Pharm.D. CPP, BCPS Clinical Pharmacist [phone] 11/17/2017 12:30 PM

## 2017-11-17 NOTE — Progress Notes (Signed)
Patient ID: Eric Murillo, male   DOB: 23-Jul-1961, 57 y.o.   MRN: [id number]     Advanced Heart Failure Rounding Note  PCP-Cardiologist: [name], MD   Subjective:    Had cardiac arrest 11/12/17 in setting of hyperK. Intubated and emergently started on CVVHD.   Extubated 11/14/17.  CVVHD ran overnight. Pulling ~ 250 cc/hr. Weight down another 8 lbs - down 32 pounds in 3 days since cardiac arrest.   Feeling better this am. Denies SOB or CP. Per Renal, stopping CRRT and planning iHD tomorrow.     Objective:   Weight Range: 123.5 kg (272 lb 4.3 oz) Body mass index is 39.63 kg/m.   Vital Signs:   Temp:  [97.7 F (36.5 C)-98.3 F (36.8 C)] 98 F (36.7 C) (04/27 1152) Pulse Rate:  [80-110] 97 (04/27 1230) Resp:  [13-24] 15 (04/27 1215) BP: (90-114)/(45-61) 102/60 (04/27 1230) SpO2:  [86 %-100 %] 99 % (04/27 1215) Arterial Line BP: (86-116)/(44-61) 102/51 (04/27 1100) Weight:  [123.5 kg (272 lb 4.3 oz)] 123.5 kg (272 lb 4.3 oz) (04/27 0920) Last BM Date: 11/09/17  Weight change: Filed Weights   11/16/17 0600 11/17/17 0600 11/17/17 0920  Weight: 125.4 kg (276 lb 7.3 oz) 123.5 kg (272 lb 4.3 oz) 123.5 kg (272 lb 4.3 oz)    Intake/Output:   Intake/Output Summary (Last 24 hours) at 11/17/2017 1232 Last data filed at 11/17/2017 0800 Gross per 24 hour  Intake 308.4 ml  Output -  Net 308.4 ml      Physical Exam    General: Sitting up in bed. On CVVHD. NAD.  HEENT: Normal anicteric  Neck: Supple. JVP difficult to see. Carotids 2+ bilat; no bruits. No thyromegaly or nodule noted. Cor: PMI lateral. Irregular irregular. Tunneled cath.  Lungs: CTAB, normal effort. No wheeze.  Abdomen: Obese Soft, non-tender, non-distended, no HSM. No bruits or masses. +BS  Extremities: No cyanosis, clubbing, or rash. Trace-1+  edema. RAC AVF.  Neuro: Alert & orientedx3, cranial nerves grossly intact. moves all 4 extremities w/o difficulty. Affect pleasant     Telemetry   Afib  60-70s, personally reviewed.   EKG   No new tracings.  Labs    CBC Recent Labs    11/15/17 0341 11/16/17 0359 11/17/17 0335 11/17/17 1050  WBC 21.6* 24.7* 23.1* 20.3*  NEUTROABS 18.1* 20.4*  --   --   HGB 11.8* 12.1* 11.6* 12.2*  HCT 40.3 41.8 39.2 40.9  MCV 89.0 89.7 88.3 88.1  PLT 90* 85* 73* 67*   Basic Metabolic Panel Recent Labs    40/98/11 0359 11/17/17 0335  NA 133* 133*  K 4.3 4.7  CL 94* 96*  CO2 24 24  GLUCOSE 106* 101*  BUN 27* 52*  CREATININE 3.75* 6.67*  CALCIUM 8.9 8.9  MG 3.0* 3.2*  PHOS 5.3* 6.7*   Liver Function Tests Recent Labs    11/15/17 0341  11/16/17 0359 11/17/17 0335  AST 125*  --  90*  --   ALT 148*  --  136*  --   ALKPHOS 171*  --  189*  --   BILITOT 2.0*  --  2.1*  --   PROT 8.4*  --  9.2*  --   ALBUMIN 3.6   < > 3.7 3.4*   < > = values in this interval not displayed.   No results for input(s): LIPASE, AMYLASE in the last 72 hours. Cardiac Enzymes No results for input(s): CKTOTAL, CKMB, CKMBINDEX, TROPONINI in the last 72  hours.  BNP: BNP (last 3 results) Recent Labs    07/26/17 0916 11/03/17 1238 11/13/17 0400  BNP 399.7* 1,160.1* 1,085.3*    ProBNP (last 3 results) No results for input(s): PROBNP in the last 8760 hours.   D-Dimer No results for input(s): DDIMER in the last 72 hours. Hemoglobin A1C No results for input(s): HGBA1C in the last 72 hours. Fasting Lipid Panel No results for input(s): CHOL, HDL, LDLCALC, TRIG, CHOLHDL, LDLDIRECT in the last 72 hours. Thyroid Function Tests No results for input(s): TSH, T4TOTAL, T3FREE, THYROIDAB in the last 72 hours.  Invalid input(s): FREET3  Other results:   Imaging    No results found.   Medications:     Scheduled Medications: . allopurinol  100 mg Oral BID  . amiodarone  200 mg Oral Daily  . apixaban  5 mg Oral BID  . calcium acetate  667 mg Oral TID WC  . chlorhexidine  15 mL Mouth Rinse BID  . Chlorhexidine Gluconate Cloth  6 each Topical  Daily  . feeding supplement (NEPRO CARB STEADY)  237 mL Oral BID BM  . insulin aspart  1-3 Units Subcutaneous Q4H  . mouth rinse  15 mL Mouth Rinse q12n4p  . sodium chloride flush  3 mL Intravenous Q12H    Infusions: . sodium chloride Stopped (11/14/17 0800)  . sodium chloride    . sodium chloride    . sodium chloride    . sodium chloride    . albumin human    . calcium gluconate    . ferumoxytol    . piperacillin-tazobactam (ZOSYN)  IV Stopped (11/16/17 2141)  . sodium chloride    . vancomycin 1 g (11/17/17 1228)    PRN Medications: sodium chloride, sodium chloride, sodium chloride, sodium chloride, sodium chloride, acetaminophen, albumin human, alteplase, alteplase, bisacodyl, docusate, fentaNYL, heparin, heparin, ipratropium-albuterol, lidocaine (PF), lidocaine (PF), lidocaine (PF), lidocaine-prilocaine, lidocaine-prilocaine, pentafluoroprop-tetrafluoroeth, pentafluoroprop-tetrafluoroeth, sodium chloride, sodium chloride flush  Assessment/Plan    1. Cardiac arrest - Had arrest on 4/22 due to hyperkalemia with subsequent VF, - Much improved with CVVHD.  2. Acute on chronic systolic CHF: Nonischemic cardiomyopathy x years. Last echo in 11/18 with moderate LV dilation, EF 20-25%, mildly decreased RV systolic function. He had a workup for cardiac amyloidosis in the past. However, negative SPEP and abdominal fat pad biopsy negative. Low voltage on ECG may be due to obesity and not amyloidosis.CKD stage IV makes management of volume difficult.  - Volume status improved, but remains somewhat elevated  - Remains on CVVHD. Stopping today and planning for iHD tomorrow.  - Off b-blocker with arrest - No ACEI/ARB/ARNI/spironolactone with ESRD 3. Atypical atrial flutter: Recurrent. Most recent DCCV in 11/18. Saw Dr Elberta Fortis, not thought to be a good ablation candidate.  - Remains in AF post code.  - Now back on po amiodarone.  - Apixiban has been on hold for HD cath. Will continue  systemic heparin.  - With ESRD will discuss with PharmD re switching apixaban to warfarin. Favor apixaban  4. AKI on CKD stage IV: Vein mapping done and VVS consulted.  Nephrology appreciated.  - Tunneled HD cath and AVF placed 4/22.  - Remains on CVVHD s/p arrest. Planning iHD tomorrow as above. 5. OSA:  - Has been unable to tolerate CPAP.  - No change to current plan.   6. Morbid obesity -Body mass index is 40.24 kg/m.  - No change to current plan.   7. Hyperkalemia - Much improved with CVVHD.  8. Acute hypoxemic respiratory failure - Extubated 11/14/17. Stable.   Length of Stay: [address]  [employer]ane [employer]  11/16/2017, 8:32 AM  Advanced Heart Failure Team Pager [phone] (M-F; 7a - 4p)  Please contact [facility] for night-coverage after hours (4p -7a ) and weekends on [email or web address]  Patient seen and examined with the above-signed Advanced Practice Provider and/or Housestaff. I personally reviewed laboratory data, imaging studies and relevant notes. I independently examined the patient and formulated the important aspects of the plan. I have edited the note to reflect any of my changes or salient points. I have personally discussed the plan with the patient and/or family.  CVVHD stopped this am. Now down 32 pounds. Off inotropes. Breathing better. BP stable. Remains in AF. Rate controlled on po amio. On heparin gtt. Platelets have dropped. Will stop heparin. Resume apixaban. Send HIT panel. Can consider repeat DC-CV next week or as outpatient if needed. Once stable on HD will try to add Bidil.   Can go to SDU.  Arvilla Meres, MD  4:59 PM

## 2017-11-17 NOTE — Progress Notes (Signed)
Dialysis treatment completed.  3000 mL ultrafiltrated.  2500 mL net fluid removal.  Patient status unchanged. Lung sounds diminished and clear to ausculation in all fields. Generalized edema. Cardiac: AFIB to Aflutter.  Cleansed RIJ catheter with chlorhexidine.  Disconnected lines and flushed ports with saline per protocol.  Ports locked with heparin and capped per protocol.    Report given to bedside, RN Tanya.

## 2017-11-18 DIAGNOSIS — N186 End stage renal disease: Secondary | ICD-10-CM

## 2017-11-18 LAB — RENAL FUNCTION PANEL
ANION GAP: 14 (ref 5–15)
Albumin: 3.4 g/dL — ABNORMAL LOW (ref 3.5–5.0)
Albumin: 3.3 g/dL — ABNORMAL LOW (ref 3.5–5.0)
Anion gap: 19 — ABNORMAL HIGH (ref 5–15)
BUN: 43 mg/dL — ABNORMAL HIGH (ref 6–20)
BUN: 51 mg/dL — ABNORMAL HIGH (ref 6–20)
CALCIUM: 8.6 mg/dL — AB (ref 8.9–10.3)
CHLORIDE: 93 mmol/L — AB (ref 101–111)
CO2: 25 mmol/L (ref 22–32)
CO2: 24 mmol/L (ref 22–32)
CREATININE: 7.42 mg/dL — AB (ref 0.61–1.24)
Calcium: 8.8 mg/dL — ABNORMAL LOW (ref 8.9–10.3)
Chloride: 88 mmol/L — ABNORMAL LOW (ref 101–111)
Creatinine, Ser: 6.52 mg/dL — ABNORMAL HIGH (ref 0.61–1.24)
GFR calc non Af Amer: 8 mL/min — ABNORMAL LOW (ref >60)
GFR, EST AFRICAN AMERICAN: 10 mL/min — AB (ref >60)
GFR, EST AFRICAN AMERICAN: 8 mL/min — AB (ref >60)
GFR, EST NON AFRICAN AMERICAN: 7 mL/min — AB (ref >60)
Glucose, Bld: 92 mg/dL (ref 65–99)
Glucose, Bld: 97 mg/dL (ref 65–99)
Phosphorus: 6 mg/dL — ABNORMAL HIGH (ref 2.5–4.6)
Phosphorus: 5.8 mg/dL — ABNORMAL HIGH (ref 2.5–4.6)
Potassium: 4.7 mmol/L (ref 3.5–5.1)
Potassium: 4.3 mmol/L (ref 3.5–5.1)
SODIUM: 131 mmol/L — AB (ref 135–145)
Sodium: 132 mmol/L — ABNORMAL LOW (ref 135–145)

## 2017-11-18 LAB — CBC
HCT: 38.2 % — ABNORMAL LOW (ref 39.0–52.0)
HEMATOCRIT: 40.9 % (ref 39.0–52.0)
Hemoglobin: 12.2 g/dL — ABNORMAL LOW (ref 13.0–17.0)
Hemoglobin: 11.5 g/dL — ABNORMAL LOW (ref 13.0–17.0)
MCH: 25.8 pg — ABNORMAL LOW (ref 26.0–34.0)
MCH: 25.8 pg — ABNORMAL LOW (ref 26.0–34.0)
MCHC: 29.8 g/dL — ABNORMAL LOW (ref 30.0–36.0)
MCHC: 30.1 g/dL (ref 30.0–36.0)
MCV: 86.5 fL (ref 78.0–100.0)
MCV: 85.7 fL (ref 78.0–100.0)
PLATELETS: 49 10*3/uL — AB (ref 150–400)
PLATELETS: 57 10*3/uL — AB (ref 150–400)
RBC: 4.73 MIL/uL (ref 4.22–5.81)
RBC: 4.46 MIL/uL (ref 4.22–5.81)
RDW: 15.9 % — ABNORMAL HIGH (ref 11.5–15.5)
RDW: 15.7 % — ABNORMAL HIGH (ref 11.5–15.5)
WBC: 19.2 10*3/uL — AB (ref 4.0–10.5)
WBC: 18.1 10*3/uL — AB (ref 4.0–10.5)

## 2017-11-18 LAB — MAGNESIUM: MAGNESIUM: 2.8 mg/dL — AB (ref 1.7–2.4)

## 2017-11-18 LAB — GLUCOSE, CAPILLARY
GLUCOSE-CAPILLARY: 85 mg/dL (ref 65–99)
GLUCOSE-CAPILLARY: 98 mg/dL (ref 65–99)
GLUCOSE-CAPILLARY: 95 mg/dL (ref 65–99)
GLUCOSE-CAPILLARY: 106 mg/dL — AB (ref 65–99)
Glucose-Capillary: 104 mg/dL — ABNORMAL HIGH (ref 65–99)
Glucose-Capillary: 92 mg/dL (ref 65–99)

## 2017-11-18 MED ORDER — LIDOCAINE HCL (PF) 1 % IJ SOLN: 5.0000 mL | INTRAMUSCULAR | Status: DC | PRN

## 2017-11-18 MED ORDER — ALTEPLASE 2 MG IJ SOLR: 2.0000 mg | Freq: Once | INTRAMUSCULAR | Status: DC | PRN

## 2017-11-18 MED ORDER — SODIUM CHLORIDE 0.9 % IV SOLN: 100.0000 mL | INTRAVENOUS | Status: DC | PRN

## 2017-11-18 MED ORDER — LIDOCAINE-PRILOCAINE 2.5-2.5 % EX CREA: 1.0000 "application " | TOPICAL_CREAM | CUTANEOUS | Status: DC | PRN

## 2017-11-18 MED ORDER — HEPARIN SODIUM (PORCINE) 1000 UNIT/ML DIALYSIS
1000.0000 [IU] | INTRAMUSCULAR | Status: DC | PRN
  Filled 2017-11-18: qty 1

## 2017-11-18 MED ORDER — DOCUSATE SODIUM 100 MG PO CAPS
100.0000 mg | ORAL_CAPSULE | Freq: Two times a day (BID) | ORAL | Status: DC | PRN
  Administered 2017-11-18: 100 mg via ORAL

## 2017-11-18 MED ORDER — PENTAFLUOROPROP-TETRAFLUOROETH EX AERO: 1.0000 "application " | INHALATION_SPRAY | CUTANEOUS | Status: DC | PRN

## 2017-11-18 NOTE — Progress Notes (Signed)
Patient ID: Eric Murillo, male   DOB: 01-16-1961, 57 y.o.   MRN: [id number]     Advanced Heart Failure Rounding Note  PCP-Cardiologist: [name], MD   Subjective:    Had cardiac arrest 11/12/17 in setting of hyperK. Intubated and emergently started on CVVHD.   Extubated 11/14/17.  Now off CVVHD. Tolerated iHD yesterday, Weight up 17 pounds overnight (doubt accurate). Overall down about 40 pounds with HD.  Denies CP, SOB, orthopnea or PND. SBP 90-110 range. Remains in AFL. 80-90 range   Objective:   Weight Range: 126.9 kg (279 lb 12.2 oz) Body mass index is 40.72 kg/m.   Vital Signs:   Temp:  [97.5 F (36.4 C)-98.7 F (37.1 C)] 98.1 F (36.7 C) (04/28 1200) Pulse Rate:  [81-85] 81 (04/27 1342) Resp:  [17-26] 23 (04/28 1200) BP: (93-111)/(47-71) 93/67 (04/28 1200) SpO2:  [81 %-100 %] 97 % (04/28 1200) Arterial Line BP: (88-120)/(48-61) 104/58 (04/27 1800) Weight:  [121 kg (266 lb 12.1 oz)-126.9 kg (279 lb 12.2 oz)] 126.9 kg (279 lb 12.2 oz) (04/28 0200) Last BM Date: 11/09/17  Weight change: Filed Weights   11/17/17 0920 11/17/17 1340 11/18/17 0200  Weight: 123.5 kg (272 lb 4.3 oz) 121 kg (266 lb 12.1 oz) 126.9 kg (279 lb 12.2 oz)    Intake/Output:   Intake/Output Summary (Last 24 hours) at 11/18/2017 1303 Last data filed at 11/18/2017 1000 Gross per 24 hour  Intake 400 ml  Output 2500 ml  Net -2100 ml      Physical Exam   General:  Sitting up in bed. Well appearing. No resp difficulty HEENT: normal Neck: supple. JVP flat Carotids 2+ bilat; no bruits. No lymphadenopathy or thryomegaly appreciated. Cor: PMI nondisplaced. Iregular rate & rhythm. No rubs, gallops or murmurs. Lungs: clear Abdomen: obese soft, nontender, nondistended. No hepatosplenomegaly. No bruits or masses. Good bowel sounds. Extremities: no cyanosis, clubbing, rash, edema  RAC AVF Neuro: alert & orientedx3, cranial nerves grossly intact. moves all 4 extremities w/o difficulty. Affect  pleasant   Telemetry   Aflutter 80-90s, personally reviewed.   EKG   No new tracings.  Labs    CBC Recent Labs    11/16/17 0359  11/18/17 0424 11/18/17 1110  WBC 24.7*   < > 19.2* 18.1*  NEUTROABS 20.4*  --   --   --   HGB 12.1*   < > 12.2* 11.5*  HCT 41.8   < > 40.9 38.2*  MCV 89.7   < > 86.5 85.7  PLT 85*   < > 49* 57*   < > = values in this interval not displayed.   Basic Metabolic Panel Recent Labs    62/13/08 0335 11/18/17 0424 11/18/17 1110  NA 133* 132* 131*  K 4.7 4.7 4.3  CL 96* 93* 88*  CO2 24 25 24   GLUCOSE 101* 92 97  BUN 52* 43* 51*  CREATININE 6.67* 6.52* 7.42*  CALCIUM 8.9 8.8* 8.6*  MG 3.2* 2.8*  --   PHOS 6.7* 6.0* 5.8*   Liver Function Tests Recent Labs    11/16/17 0359  11/18/17 0424 11/18/17 1110  AST 90*  --   --   --   ALT 136*  --   --   --   ALKPHOS 189*  --   --   --   BILITOT 2.1*  --   --   --   PROT 9.2*  --   --   --   ALBUMIN 3.7   < >  3.4* 3.3*   < > = values in this interval not displayed.   No results for input(s): LIPASE, AMYLASE in the last 72 hours. Cardiac Enzymes No results for input(s): CKTOTAL, CKMB, CKMBINDEX, TROPONINI in the last 72 hours.  BNP: BNP (last 3 results) Recent Labs    07/26/17 0916 11/03/17 1238 11/13/17 0400  BNP 399.7* 1,160.1* 1,085.3*    ProBNP (last 3 results) No results for input(s): PROBNP in the last 8760 hours.   D-Dimer No results for input(s): DDIMER in the last 72 hours. Hemoglobin A1C No results for input(s): HGBA1C in the last 72 hours. Fasting Lipid Panel No results for input(s): CHOL, HDL, LDLCALC, TRIG, CHOLHDL, LDLDIRECT in the last 72 hours. Thyroid Function Tests No results for input(s): TSH, T4TOTAL, T3FREE, THYROIDAB in the last 72 hours.  Invalid input(s): FREET3  Other results:   Imaging    No results found.   Medications:     Scheduled Medications: . allopurinol  100 mg Oral BID  . amiodarone  200 mg Oral Daily  . apixaban  5 mg Oral  BID  . calcium acetate  667 mg Oral TID WC  . Chlorhexidine Gluconate Cloth  6 each Topical Daily  . feeding supplement (NEPRO CARB STEADY)  237 mL Oral BID BM  . insulin aspart  1-3 Units Subcutaneous Q4H  . mouth rinse  15 mL Mouth Rinse q12n4p  . sodium chloride flush  3 mL Intravenous Q12H    Infusions: . sodium chloride Stopped (11/14/17 0800)  . sodium chloride    . sodium chloride    . sodium chloride    . sodium chloride    . sodium chloride    . sodium chloride    . albumin human      PRN Medications: sodium chloride, sodium chloride, sodium chloride, sodium chloride, sodium chloride, sodium chloride, sodium chloride, acetaminophen, albumin human, alteplase, alteplase, alteplase, bisacodyl, docusate sodium, fentaNYL, heparin, heparin, heparin, ipratropium-albuterol, lidocaine (PF), lidocaine (PF), lidocaine (PF), lidocaine (PF), lidocaine-prilocaine, lidocaine-prilocaine, lidocaine-prilocaine, pentafluoroprop-tetrafluoroeth, pentafluoroprop-tetrafluoroeth, pentafluoroprop-tetrafluoroeth, sodium chloride flush  Assessment/Plan   1. Cardiac arrest - Had arrest on 4/22 due to hyperkalemia with subsequent VF, - Much improved with CVVHD.  2. Acute on chronic systolic CHF: Nonischemic cardiomyopathy x years. Last echo in 11/18 with moderate LV dilation, EF 20-25%, mildly decreased RV systolic function.  He had a workup for cardiac amyloidosis in the past.  However, negative SPEP and abdominal fat pad biopsy negative. Low voltage on ECG may be due to obesity and not amyloidosis. CKD stage IV makes management of volume difficult.  - Volume status improved with HD. No down 40 pounds. Euvolemic - Tolerating iHD well - Off b-blocker with arrest. Can restart low-dose carvedilol as tolerated BP currently too soft. May need midodrine for support  - No ACEI/ARB/ARNI/spironolactone with ESRD 3. Atypical atrial flutter: Recurrent. Most recent DCCV in 11/18.  Saw Dr Elberta Fortis, not thought to be  a good ablation candidate.  - Remains in AFL. VRates 80-90bpm. Tolerating well. Can consider repeat DC-CV prior to d/c.  - Now back on po amiodarone.  - Platelets dropped with heparin. Switched to apixaban. HIT panel sent. PLTs 57k today. No bleeding. Discussed dosing with PharmD personally.HIT panel results still pending.  4. AKI on CKD stage IV -> now ESRD: - Nephrology following  - Tunneled HD cath and AVF placed 4/22.  - Tolerating iHD well. Can use midodrine for BP support as needed - Due for HD tomorrow 5. OSA:  -  Has been unable to tolerate CPAP.  - No change to current plan.   6. Morbid obesity -Body mass index is 40.72 kg/m.  - No change to current plan.   7. Hyperkalemia - Much improved with CVVHD.  8. Acute hypoxemic respiratory failure - Extubated 11/14/17. Stable.  9. Thrombocytopenia - Platelets dropped with heparin. Switched to apixaban. HIT panel sent. PLTs 73k. No bleeding. Discussed dosing with 10. ID - Completed course of zosyn 4/27  Can go to tele. Will need to arrange outpatient HD prior to d/c.    Length of Stay: 14  Arvilla Meres, MD  1:03 PM   Advanced Heart Failure Team Pager [phone] (M-F; 7a - 4p)  Please contact [facility] for night-coverage after hours (4p -7a ) and weekends on [email or web address]

## 2017-11-18 NOTE — Progress Notes (Signed)
[facility] NEPHROLOGY PROGRESS NOTE  Assessment/ Plan: Pt is a 57 y.o. yo male with chronic kidney disease stage IV, admitted with  congestive heart failure exacerbation and worsening renal failure.  Placement of right IJ tunnel catheter and AV fistula on 11/12/17 by VVS.  Assessment/Plan:  #Progressive CKD stage IV to ESRD: due to CHF.  Placement of right upper extremity AV fistula and right IJ tunneled catheterby VVS on 4/22.  Patient had a cardiac arrest on 4/22 and transferred to ICU.  Started on CRRT from 4/22-4/26.  Tolerated intermittent dialysis yesterday.  Volume status improving.  Plan for another hemodialysis tomorrow. CLIP in process.  #Hyperkalemia: improved. Monitor BMP.  #Acute respiratory failure with hypoxia: extubated.  Currently on 2 L of oxygen.  #CHF exacerbation with volume overload: Improving with dialysis.  #Hypotension improved.  Off pressor now.  #Anemia of CKD: Hemoglobin acceptable.  Iron saturation 17% with a serum iron of 78.  Ordered a dose of Feraheme 510 mg IV on 4/22.  #Secondary hyperparathyroidism:  Phosphorus elevated.  Started PhosLo with food.Marland Kitchen  #Nonischemic cardiomyopathy status post ICD: EF of 25-30%.  #A. fib/flutter: On amiodarone  Subjective: Seen and examined in ICU.  Denies chest pain, shortness of breath, nausea or vomiting. Objective Vital signs in last 24 hours: Vitals:   11/18/17 0600 11/18/17 0610 11/18/17 0800 11/18/17 0806  BP:   100/71   Pulse:      Resp: (!) 26 20 (!) 24   Temp:    (!) 97.5 F (36.4 C)  TempSrc:    Axillary  SpO2: 95% 97% 96%   Weight:      Height:       Weight change: 0 kg (0 lb)  Intake/Output Summary (Last 24 hours) at 11/18/2017 0919 Last data filed at 11/18/2017 0800 Gross per 24 hour  Intake 340 ml  Output 2500 ml  Net -2160 ml       Labs: Basic Metabolic Panel: Recent Labs  Lab 11/16/17 0359 11/17/17 0335 11/18/17 0424  NA 133* 133* 132*  K 4.3 4.7 4.7  CL 94* 96*  93*  CO2 24 24 25   GLUCOSE 106* 101* 92  BUN 27* 52* 43*  CREATININE 3.75* 6.67* 6.52*  CALCIUM 8.9 8.9 8.8*  PHOS 5.3* 6.7* 6.0*   Liver Function Tests: Recent Labs  Lab 11/12/17 1929  11/15/17 0341  11/16/17 0359 11/17/17 0335 11/18/17 0424  AST 77*  --  125*  --  90*  --   --   ALT 50  --  148*  --  136*  --   --   ALKPHOS 182*  --  171*  --  189*  --   --   BILITOT 2.1*  --  2.0*  --  2.1*  --   --   PROT 7.0  --  8.4*  --  9.2*  --   --   ALBUMIN 3.3*   < > 3.6   < > 3.7 3.4* 3.4*   < > = values in this interval not displayed.   No results for input(s): LIPASE, AMYLASE in the last 168 hours. No results for input(s): AMMONIA in the last 168 hours. CBC: Recent Labs  Lab 11/15/17 0341 11/16/17 0359 11/17/17 0335 11/17/17 1050 11/18/17 0424  WBC 21.6* 24.7* 23.1* 20.3* 19.2*  NEUTROABS 18.1* 20.4*  --   --   --   HGB 11.8* 12.1* 11.6* 12.2* 12.2*  HCT 40.3 41.8 39.2 40.9 40.9  MCV 89.0 89.7  88.3 88.1 86.5  PLT 90* 85* 73* 67* 49*   Cardiac Enzymes: No results for input(s): CKTOTAL, CKMB, CKMBINDEX, TROPONINI in the last 168 hours. CBG: Recent Labs  Lab 11/17/17 1551 11/17/17 2138 11/17/17 2339 11/18/17 0347 11/18/17 0754  GLUCAP 99 115* 104* 92 85    Iron Studies:  No results for input(s): IRON, TIBC, TRANSFERRIN, FERRITIN in the last 72 hours. Studies/Results: No results found.  Medications: Infusions: . sodium chloride Stopped (11/14/17 0800)  . sodium chloride    . sodium chloride    . sodium chloride    . sodium chloride    . albumin human    . calcium gluconate    . ferumoxytol    . piperacillin-tazobactam (ZOSYN)  IV Stopped (11/18/17 0551)  . sodium chloride      Scheduled Medications: . allopurinol  100 mg Oral BID  . amiodarone  200 mg Oral Daily  . apixaban  5 mg Oral BID  . calcium acetate  667 mg Oral TID WC  . chlorhexidine  15 mL Mouth Rinse BID  . Chlorhexidine Gluconate Cloth  6 each Topical Daily  . feeding supplement  (NEPRO CARB STEADY)  237 mL Oral BID BM  . insulin aspart  1-3 Units Subcutaneous Q4H  . mouth rinse  15 mL Mouth Rinse q12n4p  . sodium chloride flush  3 mL Intravenous Q12H    have reviewed scheduled and prn medications.  Physical Exam: General: Not in distress Heart: Regular rate rhythm S1-S2 normal.  No rubs Lungs: Distant breath sound, no crackles Abdomen: Mild distention, soft. extremities: Bilateral lower extremities pitting and chronic edema, improving Dialysis Access: Right IJ tunnel catheter site looks clean.  Right AVF site has good thrill  Audi Conover Prasad Jemia Fata 11/18/2017,9:19 AM  LOS: 14 days

## 2017-11-18 NOTE — Progress Notes (Signed)
Patient refuses to wear BiPAP.

## 2017-11-19 LAB — RENAL FUNCTION PANEL
ANION GAP: 16 — AB (ref 5–15)
Albumin: 3.3 g/dL — ABNORMAL LOW (ref 3.5–5.0)
BUN: 70 mg/dL — ABNORMAL HIGH (ref 6–20)
CHLORIDE: 93 mmol/L — AB (ref 101–111)
CO2: 22 mmol/L (ref 22–32)
Calcium: 9.2 mg/dL (ref 8.9–10.3)
Creatinine, Ser: 9.9 mg/dL — ABNORMAL HIGH (ref 0.61–1.24)
GFR calc non Af Amer: 5 mL/min — ABNORMAL LOW (ref >60)
GFR, EST AFRICAN AMERICAN: 6 mL/min — AB (ref >60)
GLUCOSE: 90 mg/dL (ref 65–99)
POTASSIUM: 4.8 mmol/L (ref 3.5–5.1)
Phosphorus: 7.5 mg/dL — ABNORMAL HIGH (ref 2.5–4.6)
SODIUM: 131 mmol/L — AB (ref 135–145)

## 2017-11-19 LAB — GLUCOSE, CAPILLARY
GLUCOSE-CAPILLARY: 96 mg/dL (ref 65–99)
GLUCOSE-CAPILLARY: 105 mg/dL — AB (ref 65–99)
Glucose-Capillary: 105 mg/dL — ABNORMAL HIGH (ref 65–99)
Glucose-Capillary: 97 mg/dL (ref 65–99)

## 2017-11-19 LAB — HEPATIC FUNCTION PANEL
ALBUMIN: 3.3 g/dL — AB (ref 3.5–5.0)
ALK PHOS: 170 U/L — AB (ref 38–126)
ALT: 64 U/L — ABNORMAL HIGH (ref 17–63)
AST: 43 U/L — AB (ref 15–41)
BILIRUBIN TOTAL: 1.4 mg/dL — AB (ref 0.3–1.2)
Bilirubin, Direct: 0.6 mg/dL — ABNORMAL HIGH (ref 0.1–0.5)
Indirect Bilirubin: 0.8 mg/dL (ref 0.3–0.9)
TOTAL PROTEIN: 8 g/dL (ref 6.5–8.1)

## 2017-11-19 LAB — CBC
HEMATOCRIT: 39.2 % (ref 39.0–52.0)
HEMOGLOBIN: 12.1 g/dL — AB (ref 13.0–17.0)
MCH: 26.2 pg (ref 26.0–34.0)
MCHC: 30.9 g/dL (ref 30.0–36.0)
MCV: 84.8 fL (ref 78.0–100.0)
Platelets: 58 10*3/uL — ABNORMAL LOW (ref 150–400)
RBC: 4.62 MIL/uL (ref 4.22–5.81)
RDW: 15.8 % — ABNORMAL HIGH (ref 11.5–15.5)
WBC: 17.9 10*3/uL — ABNORMAL HIGH (ref 4.0–10.5)

## 2017-11-19 LAB — MAGNESIUM: Magnesium: 3.1 mg/dL — ABNORMAL HIGH (ref 1.7–2.4)

## 2017-11-19 LAB — CULTURE, BLOOD (ROUTINE X 2)
Culture: NO GROWTH
Culture: NO GROWTH
Special Requests: ADEQUATE

## 2017-11-19 LAB — HEPARIN INDUCED PLATELET AB (HIT ANTIBODY): HEPARIN INDUCED PLT AB: 0.263 {OD_unit} (ref 0.000–0.400)

## 2017-11-19 MED ORDER — SODIUM CHLORIDE 0.9 % IV SOLN
INTRAVENOUS | Status: DC
  Administered 2017-11-21 (×2): via INTRAVENOUS

## 2017-11-19 MED ORDER — SODIUM CHLORIDE 0.9% FLUSH: 3.0000 mL | INTRAVENOUS | Status: DC | PRN

## 2017-11-19 MED ORDER — RENA-VITE PO TABS
1.0000 | ORAL_TABLET | Freq: Every day | ORAL | Status: DC
  Administered 2017-11-20 – 2017-11-21 (×3): 1 via ORAL
  Filled 2017-11-19 (×3): qty 1

## 2017-11-19 MED ORDER — SODIUM CHLORIDE 0.9 % IV SOLN: 250.0000 mL | INTRAVENOUS | Status: DC

## 2017-11-19 MED ORDER — SODIUM CHLORIDE 0.9% FLUSH
3.0000 mL | Freq: Two times a day (BID) | INTRAVENOUS | Status: DC
  Administered 2017-11-19 – 2017-11-22 (×6): 3 mL via INTRAVENOUS

## 2017-11-19 MED ORDER — SODIUM CHLORIDE 0.9 % IV SOLN
INTRAVENOUS | Status: DC
  Administered 2017-11-20: 05:00:00 via INTRAVENOUS

## 2017-11-19 NOTE — Progress Notes (Signed)
Patient ID: Eric Murillo, male   DOB: 28-Dec-1960, 57 y.o.   MRN: [id number]     Advanced Heart Failure Rounding Note  PCP-Cardiologist: [name], MD   Subjective:    Admitted with acute on chronic systolic CHF and AKI.   Had cardiac arrest 11/12/17 in setting of hyperkalemia. Intubated and emergently started on CVVHD.   Extubated 11/14/17.  Now off CVVHD and getting iHD.  Weight is coming down.   Denies dyspnea, some walking yesterday.  Appears to be in atypical flutter still.    Objective:   Weight Range: 272 lb 6.4 oz (123.6 kg) Body mass index is 39.65 kg/m.   Vital Signs:   Temp:  [97.9 F (36.6 C)-99.4 F (37.4 C)] 97.9 F (36.6 C) (04/29 0504) Pulse Rate:  [88-90] 90 (04/29 0504) Resp:  [17-27] 18 (04/29 0504) BP: (93-110)/(65-96) 96/71 (04/29 0504) SpO2:  [94 %-99 %] 97 % (04/29 0504) Weight:  [272 lb 6.4 oz (123.6 kg)] 272 lb 6.4 oz (123.6 kg) (04/28 2331) Last BM Date: 11/09/17  Weight change: Filed Weights   11/17/17 1340 11/18/17 0200 11/18/17 2331  Weight: 266 lb 12.1 oz (121 kg) 279 lb 12.2 oz (126.9 kg) 272 lb 6.4 oz (123.6 kg)    Intake/Output:   Intake/Output Summary (Last 24 hours) at 11/19/2017 0828 Last data filed at 11/19/2017 0600 Gross per 24 hour  Intake 536 ml  Output 0 ml  Net 536 ml      Physical Exam   General: NAD Neck: JVP 9-10 cm, no thyromegaly or thyroid nodule.  Lungs: Clear to auscultation bilaterally with normal respiratory effort. CV: nonpalpable PMI.  Heart irregular S1/S2, no S3/S4, no murmur.  Trace ankle edema.   Abdomen: Soft, nontender, no hepatosplenomegaly, no distention.  Skin: Intact without lesions or rashes.  Neurologic: Alert and oriented x 3.  Psych: Normal affect. Extremities: No clubbing or cyanosis.  HEENT: Normal.    Telemetry   Aflutter 80s, personally reviewed.   EKG   No new tracings.  Labs    CBC Recent Labs    11/18/17 0424 11/18/17 1110  WBC 19.2* 18.1*  HGB 12.2* 11.5*    HCT 40.9 38.2*  MCV 86.5 85.7  PLT 49* 57*   Basic Metabolic Panel Recent Labs    40/98/11 0424 11/18/17 1110 11/19/17 0621  NA 132* 131* 131*  K 4.7 4.3 4.8  CL 93* 88* 93*  CO2 25 24 22   GLUCOSE 92 97 90  BUN 43* 51* 70*  CREATININE 6.52* 7.42* 9.90*  CALCIUM 8.8* 8.6* 9.2  MG 2.8*  --  3.1*  PHOS 6.0* 5.8* 7.5*   Liver Function Tests Recent Labs    11/18/17 1110 11/19/17 0621  AST  --  43*  ALT  --  64*  ALKPHOS  --  170*  BILITOT  --  1.4*  PROT  --  8.0  ALBUMIN 3.3* 3.3*  3.3*   No results for input(s): LIPASE, AMYLASE in the last 72 hours. Cardiac Enzymes No results for input(s): CKTOTAL, CKMB, CKMBINDEX, TROPONINI in the last 72 hours.  BNP: BNP (last 3 results) Recent Labs    07/26/17 0916 11/03/17 1238 11/13/17 0400  BNP 399.7* 1,160.1* 1,085.3*    ProBNP (last 3 results) No results for input(s): PROBNP in the last 8760 hours.   D-Dimer No results for input(s): DDIMER in the last 72 hours. Hemoglobin A1C No results for input(s): HGBA1C in the last 72 hours. Fasting Lipid Panel No results for input(s):  CHOL, HDL, LDLCALC, TRIG, CHOLHDL, LDLDIRECT in the last 72 hours. Thyroid Function Tests No results for input(s): TSH, T4TOTAL, T3FREE, THYROIDAB in the last 72 hours.  Invalid input(s): FREET3  Other results:   Imaging    No results found.   Medications:     Scheduled Medications: . allopurinol  100 mg Oral BID  . amiodarone  200 mg Oral Daily  . apixaban  5 mg Oral BID  . calcium acetate  667 mg Oral TID WC  . Chlorhexidine Gluconate Cloth  6 each Topical Daily  . feeding supplement (NEPRO CARB STEADY)  237 mL Oral BID BM  . insulin aspart  1-3 Units Subcutaneous Q4H  . mouth rinse  15 mL Mouth Rinse q12n4p  . sodium chloride flush  3 mL Intravenous Q12H    Infusions: . sodium chloride Stopped (11/14/17 0800)  . sodium chloride    . sodium chloride    . sodium chloride    . sodium chloride    . sodium chloride     . sodium chloride    . albumin human      PRN Medications: sodium chloride, sodium chloride, sodium chloride, sodium chloride, sodium chloride, sodium chloride, sodium chloride, acetaminophen, albumin human, alteplase, alteplase, alteplase, bisacodyl, docusate sodium, fentaNYL, heparin, heparin, heparin, ipratropium-albuterol, lidocaine (PF), lidocaine (PF), lidocaine (PF), lidocaine (PF), lidocaine-prilocaine, lidocaine-prilocaine, lidocaine-prilocaine, pentafluoroprop-tetrafluoroeth, pentafluoroprop-tetrafluoroeth, pentafluoroprop-tetrafluoroeth, sodium chloride flush  Assessment/Plan   1. Cardiac arrest: Had arrest on 4/22 due to hyperkalemia with subsequent VF.  - Much improved with CVVHD.  2. Acute on chronic systolic CHF: Nonischemic cardiomyopathy x years. 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  Last echo in 11/18 with moderate LV dilation, EF 20-25%, mildly decreased RV systolic function.  He had a workup for cardiac amyloidosis in the past.  However, negative SPEP and abdominal fat pad biopsy negative. Low voltage on ECG may be due to obesity and not amyloidosis. This admission, he has progressed to ESRD, now volume managed by HD.  Weight down considerably.  - Continue HD for volume removal.  - Tolerating iHD well - Restart low dose Coreg 3.125 mg bid. 3. Atypical atrial flutter: Recurrent. Most recent DCCV in 11/18.  Saw Dr Elberta Fortis, not thought to be a good ablation candidate. Remains in AFL. VRates 80s bpm.  - Now back on po amiodarone.  - Platelets dropped with heparin. Switched to apixaban. HIT panel sent, still pending. No CBC yet today => send.  - Now that volume status more controlled, will plan TEE-guided DCCV tomorrow.  Discussed risks/benefits with patient, he agrees.  4. 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  Tunneled HD cath and AVF placed 4/22. Tolerating iHD well. Can use midodrine for BP support as needed.  - Due for HD today.  5. OSA: Has been unable to tolerate CPAP.    6. Morbid obesity -Body mass index is 39.65 kg/m.  7. Hyperkalemia: Resolved with RRT.  8. Acute hypoxemic respiratory failure: Extubated 11/14/17. Stable.  9. Thrombocytopenia: Platelets dropped with heparin. Switched to apixaban. HIT panel sent, still pending. No bleeding.  - Need CBC today.  10. ID: Completed course of zosyn 4/27  Mobilize with cardiac rehab.   Length of Stay: 15  [name], MD  8:28 AM   Advanced Heart Failure Team Pager [phone] (M-F; 7a - 4p)  Please contact [facility] for night-coverage after hours (4p -7a ) and weekends on [email or web address]

## 2017-11-19 NOTE — Progress Notes (Signed)
Physical Therapy Treatment Patient Details Name: Eric Murillo MRN: [id number] DOB: 1960/11/22 Today's Date: 11/19/2017    History of Present Illness Pt is a 57 y.o. male admitted 11/03/17 with cough and SOB, found to be volume overloaded and in A-flutter with controlled ventricular rate; worked up for HF and a-flutter. On 4/22, had wide-complex tacharrhythmia prompting ACLS, intubation and transfer to ICU; emergently started on CVVHD. Permanent RIJ HD cath and AVG placed 4/22. Extubated 4/24.  Remains on CRRT as of 4/25. PMH includes AICD placement, OA, CHF, CKD, gout, HTN, morbid obesity, h/o cardiac cath, cardioversion, multiple TEE.     PT Comments    Pt on 1 LO2 via [place]. Pt ambulated to the bathroom and then transportation came to take patient to dialysis. Pt with improved mobility however remains to have flat affect and delayed processing. Acute PT to con't to follow.   Follow Up Recommendations  Home health PT;Supervision - Intermittent     Equipment Recommendations  None recommended by PT    Recommendations for Other Services       Precautions / Restrictions Precautions Precautions: Fall Restrictions Weight Bearing Restrictions: No    Mobility  Bed Mobility Overal bed mobility: Needs Assistance Bed Mobility: Supine to Sit     Supine to sit: Supervision     General bed mobility comments: HOB elevated, used bed rail  Transfers Overall transfer level: Needs assistance Equipment used: None Transfers: Sit to/from Stand Sit to Stand: Min guard         General transfer comment: Min guard for balance  Ambulation/Gait Ambulation/Gait assistance: Min guard Ambulation Distance (Feet): 15 Feet(limited to bathroom) Assistive device: None Gait Pattern/deviations: Step-through pattern;Wide base of support Gait velocity: dec   General Gait Details: limited step height, mild instability, wide base of support due to body habitus   Stairs             Wheelchair  Mobility    Modified Rankin (Stroke Patients Only)       Balance Overall balance assessment: Needs assistance Sitting-balance support: Feet supported Sitting balance-Leahy Scale: Good     Standing balance support: No upper extremity supported;During functional activity Standing balance-Leahy Scale: Fair Standing balance comment: Can static stand with no UE support; required HHA to maintain dynamic balance                            Cognition Arousal/Alertness: Awake/alert Behavior During Therapy: Flat affect Overall Cognitive Status: Impaired/Different from baseline Area of Impairment: Problem solving                             Problem Solving: Slow processing        Exercises      General Comments General comments (skin integrity, edema, etc.): VSS throughout      Pertinent Vitals/Pain Pain Assessment: No/denies pain    Home Living                      Prior Function            PT Goals (current goals can now be found in the care plan section) Progress towards PT goals: Progressing toward goals    Frequency    Min 3X/week      PT Plan Current plan remains appropriate    Co-evaluation              AM-PAC  PT "6 Clicks" Daily Activity  Outcome Measure  Difficulty turning over in bed (including adjusting bedclothes, sheets and blankets)?: A Little Difficulty moving from lying on back to sitting on the side of the bed? : A Little Difficulty sitting down on and standing up from a chair with arms (e.g., wheelchair, bedside commode, etc,.)?: A Little Help needed moving to and from a bed to chair (including a wheelchair)?: A Little Help needed walking in hospital room?: A Little Help needed climbing 3-5 steps with a railing? : A Lot 6 Click Score: 17    End of Session   Activity Tolerance: Patient tolerated treatment well Patient left: (on commode with RN) Nurse Communication: Mobility status PT Visit  Diagnosis: Unsteadiness on feet (R26.81);Muscle weakness (generalized) (M62.81);Difficulty in walking, not elsewhere classified (R26.2)     Time: 1117-3567 PT Time Calculation (min) (ACUTE ONLY): 14 min  Charges:  $Gait Training: 8-22 mins                    G Codes:       Eric Murillo, PT, DPT Pager #: [phone] Office #: [phone]    Eric Murillo M Eric Murillo 11/19/2017, 2:27 PM

## 2017-11-19 NOTE — Progress Notes (Signed)
MD, could u change Novolg coverage to ACHS instead of q4 hrs, pt's CBG's have been normal, thanks, Glenna Fellows.

## 2017-11-19 NOTE — Progress Notes (Signed)
Subjective: Interval History: has no complaint .  Objective: Vital signs in last 24 hours: Temp:  [97.9 F (36.6 C)-99.4 F (37.4 C)] 97.9 F (36.6 C) (04/29 0504) Pulse Rate:  [73-90] 73 (04/29 0855) Resp:  [17-27] 18 (04/29 0504) BP: (93-110)/(65-96) 96/72 (04/29 0855) SpO2:  [94 %-99 %] 99 % (04/29 0855) Weight:  [123.6 kg (272 lb 6.4 oz)] 123.6 kg (272 lb 6.4 oz) (04/28 2331) Weight change: 0.06 kg (2.1 oz)  Intake/Output from previous day: 04/28 0701 - 04/29 0700 In: 656 [P.O.:656] Out: 0  Intake/Output this shift: Total I/O In: 240 [P.O.:240] Out: -   General appearance: alert, cooperative and morbidly obese Resp: diminished breath sounds bilaterally Chest wall: RIJ PC Cardio: S1, S2 normal and systolic murmur: holosystolic 2/6, blowing at apex GI: obese, pos bs, soft,  liver down 6 cm Extremities: edema 3+ and AVF RUA  Lab Results: Recent Labs    11/18/17 0424 11/18/17 1110  WBC 19.2* 18.1*  HGB 12.2* 11.5*  HCT 40.9 38.2*  PLT 49* 57*   BMET:  Recent Labs    11/18/17 1110 11/19/17 0621  NA 131* 131*  K 4.3 4.8  CL 88* 93*  CO2 24 22  GLUCOSE 97 90  BUN 51* 70*  CREATININE 7.42* 9.90*  CALCIUM 8.6* 9.2   No results for input(s): PTH in the last 72 hours. Iron Studies: No results for input(s): IRON, TIBC, TRANSFERRIN, FERRITIN in the last 72 hours.  Studies/Results: No results found.  I have reviewed the patient's current medications.  Assessment/Plan: 1 ESRD for HD. 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

## 2017-11-19 NOTE — Progress Notes (Signed)
Occupational Therapy Treatment Patient Details Name: Eric Murillo MRN: [id number] DOB: 12/11/1960 Today's Date: 11/19/2017    History of present illness Pt is a 57 y.o. male admitted 11/03/17 with cough and SOB, found to be volume overloaded and in A-flutter with controlled ventricular rate; worked up for HF and a-flutter. On 4/22, had wide-complex tacharrhythmia prompting ACLS, intubation and transfer to ICU; emergently started on CVVHD. Permanent RIJ HD cath and AVG placed 4/22. Extubated 4/24.  Remains on CRRT as of 4/25. PMH includes AICD placement, OA, CHF, CKD, gout, HTN, morbid obesity, h/o cardiac cath, cardioversion, multiple TEE.    OT comments  Pt demonstrating improvement toward OT goals this session. He was able to demonstrate increased activity tolerance for ADL participation by completing longer duration of standing and ambulating tasks. He continues to demonstrate a flat affect and require increased time for processing but is improving in his functional capabilities well. Pt would benefit from continued OT services while admitted to improve independence and safety with ADL and functional mobility. D/C recommendation remains appropriate. Pt reporting frustration during session that transport arrived but then did not take him to HD. OT notified RN who is looking into this as she was not aware he had not transported to HD.    Follow Up Recommendations  No OT follow up;Supervision/Assistance - 24 hour(initially)    Equipment Recommendations  Tub/shower seat;3 in 1 bedside commode    Recommendations for Other Services      Precautions / Restrictions Precautions Precautions: Fall Restrictions Weight Bearing Restrictions: No       Mobility Bed Mobility Overal bed mobility: Needs Assistance Bed Mobility: Supine to Sit     Supine to sit: Supervision     General bed mobility comments: Seated at EOB  Transfers Overall transfer level: Needs assistance Equipment used:  None Transfers: Sit to/from Stand Sit to Stand: Min guard         General transfer comment: Min guard for balance    Balance Overall balance assessment: Needs assistance Sitting-balance support: Feet supported Sitting balance-Leahy Scale: Good Sitting balance - Comments: able to don/doff socks sitting EOB   Standing balance support: No upper extremity supported;During functional activity Standing balance-Leahy Scale: Fair Standing balance comment: Min guard assist for dynamic tasks.                            ADL either performed or assessed with clinical judgement   ADL Overall ADL's : Needs assistance/impaired                         Toilet Transfer: Min guard;Ambulation           Functional mobility during ADLs: Min guard General ADL Comments: Session focused on increased activity tolerance related to ADL and IADL participation. Initiated education concerning energy conservation strategies.      Vision       Perception     Praxis      Cognition Arousal/Alertness: Awake/alert Behavior During Therapy: Flat affect Overall Cognitive Status: Impaired/Different from baseline Area of Impairment: Problem solving                             Problem Solving: Slow processing General Comments: Pt with flat affect and requiring increased time for processing.         Exercises Exercises: Other exercises   Shoulder Instructions  General Comments On 1 L O2 throughout session. Pt expressing frustration that he was supposed to go to HD but was not transported. Notified RN.     Pertinent Vitals/ Pain       Pain Assessment: No/denies pain  Home Living                                          Prior Functioning/Environment              Frequency  Min 2X/week        Progress Toward Goals  OT Goals(current goals can now be found in the care plan section)  Progress towards OT goals: Progressing  toward goals  Acute Rehab OT Goals Patient Stated Goal: Return home and lose weight OT Goal Formulation: With patient Time For Goal Achievement: 11/30/17 Potential to Achieve Goals: Good  Plan Discharge plan remains appropriate    Co-evaluation                 AM-PAC PT "6 Clicks" Daily Activity     Outcome Measure   Help from another person eating meals?: A Little Help from another person taking care of personal grooming?: A Little Help from another person toileting, which includes using toliet, bedpan, or urinal?: A Little Help from another person bathing (including washing, rinsing, drying)?: A Little Help from another person to put on and taking off regular upper body clothing?: A Little Help from another person to put on and taking off regular lower body clothing?: A Little 6 Click Score: 18    End of Session Equipment Utilized During Treatment: Oxygen(1L/min)  OT Visit Diagnosis: Unsteadiness on feet (R26.81);Muscle weakness (generalized) (M62.81)   Activity Tolerance Patient tolerated treatment well   Patient Left in bed;with call bell/phone within reach   Nurse Communication Mobility status        Time: 1550-1610 OT Time Calculation (min): 20 min  Charges: OT General Charges $OT Visit: 1 Visit OT Treatments $Self Care/Home Management : 8-22 mins  Doristine Section, MS OTR/L  Pager: [phone]    Rondia Higginbotham A Banyan Goodchild 11/19/2017, 5:01 PM

## 2017-11-20 ENCOUNTER — Other Ambulatory Visit: Payer: Self-pay

## 2017-11-20 DIAGNOSIS — I480 Paroxysmal atrial fibrillation: Secondary | ICD-10-CM

## 2017-11-20 DIAGNOSIS — N186 End stage renal disease: Secondary | ICD-10-CM

## 2017-11-20 LAB — CBC
HCT: 32.9 % — ABNORMAL LOW (ref 39.0–52.0)
Hemoglobin: 10.3 g/dL — ABNORMAL LOW (ref 13.0–17.0)
MCH: 26.5 pg (ref 26.0–34.0)
MCHC: 31.3 g/dL (ref 30.0–36.0)
MCV: 84.6 fL (ref 78.0–100.0)
PLATELETS: 51 10*3/uL — AB (ref 150–400)
RBC: 3.89 MIL/uL — ABNORMAL LOW (ref 4.22–5.81)
RDW: 15.8 % — AB (ref 11.5–15.5)
WBC: 13.8 10*3/uL — AB (ref 4.0–10.5)

## 2017-11-20 LAB — RENAL FUNCTION PANEL
Albumin: 3.2 g/dL — ABNORMAL LOW (ref 3.5–5.0)
Anion gap: 18 — ABNORMAL HIGH (ref 5–15)
BUN: 43 mg/dL — AB (ref 6–20)
CHLORIDE: 91 mmol/L — AB (ref 101–111)
CO2: 23 mmol/L (ref 22–32)
CREATININE: 8.09 mg/dL — AB (ref 0.61–1.24)
Calcium: 8.7 mg/dL — ABNORMAL LOW (ref 8.9–10.3)
GFR calc Af Amer: 8 mL/min — ABNORMAL LOW (ref >60)
GFR, EST NON AFRICAN AMERICAN: 7 mL/min — AB (ref >60)
Glucose, Bld: 83 mg/dL (ref 65–99)
Phosphorus: 5 mg/dL — ABNORMAL HIGH (ref 2.5–4.6)
Potassium: 4 mmol/L (ref 3.5–5.1)
SODIUM: 132 mmol/L — AB (ref 135–145)

## 2017-11-20 LAB — GLUCOSE, CAPILLARY
GLUCOSE-CAPILLARY: 107 mg/dL — AB (ref 65–99)
Glucose-Capillary: 136 mg/dL — ABNORMAL HIGH (ref 65–99)
Glucose-Capillary: 95 mg/dL (ref 65–99)
Glucose-Capillary: 125 mg/dL — ABNORMAL HIGH (ref 65–99)

## 2017-11-20 LAB — MAGNESIUM: Magnesium: 2.6 mg/dL — ABNORMAL HIGH (ref 1.7–2.4)

## 2017-11-20 MED ORDER — PRO-STAT SUGAR FREE PO LIQD
30.0000 mL | Freq: Two times a day (BID) | ORAL | Status: DC
  Administered 2017-11-20: 30 mL via ORAL
  Filled 2017-11-20: qty 30

## 2017-11-20 MED ORDER — CARVEDILOL 3.125 MG PO TABS: 3.1250 mg | ORAL_TABLET | Freq: Two times a day (BID) | ORAL | Status: DC

## 2017-11-20 MED ORDER — CARVEDILOL 3.125 MG PO TABS
3.1250 mg | ORAL_TABLET | ORAL | Status: DC
  Administered 2017-11-20 – 2017-11-22 (×2): 3.125 mg via ORAL
  Filled 2017-11-20 (×2): qty 1

## 2017-11-20 MED ORDER — CALCITRIOL 0.5 MCG PO CAPS
0.5000 ug | ORAL_CAPSULE | ORAL | Status: DC
  Administered 2017-11-20 – 2017-11-22 (×2): 0.5 ug via ORAL
  Filled 2017-11-20 (×2): qty 1

## 2017-11-20 NOTE — Progress Notes (Signed)
Subjective: Interval History: has no complaint   Objective: Vital signs in last 24 hours: Temp:  [97.9 F (36.6 C)-98.8 F (37.1 C)] 98.8 F (37.1 C) (04/30 0434) Pulse Rate:  [67-81] 72 (04/30 0434) Resp:  [17-18] 18 (04/30 0434) BP: (80-112)/(43-70) 88/65 (04/30 0434) SpO2:  [95 %-100 %] 98 % (04/30 0434) Weight:  [121.5 kg (267 lb 12.8 oz)-123.8 kg (272 lb 14.9 oz)] 121.5 kg (267 lb 12.8 oz) (04/30 0434) Weight change: 0.24 kg (8.5 oz)  Intake/Output from previous day: 04/29 0701 - 04/30 0700 In: 720 [P.O.:720] Out: 2038  Intake/Output this shift: No intake/output data recorded.  General appearance: alert, cooperative, no distress and morbidly obese Resp: diminished breath sounds bilaterally Chest wall: RIJ cath Cardio: irregularly irregular rhythm, S1, S2 normal and systolic murmur: holosystolic 2/6, blowing at apex GI: obese, pos bs, soft,, liver down 6 cm Extremities: edema 2+ and AVF RUA  Lab Results: Recent Labs    11/19/17 0610 11/20/17 0730  WBC 17.9* 13.8*  HGB 12.1* 10.3*  HCT 39.2 32.9*  PLT 58* 51*   BMET:  Recent Labs    11/19/17 0621 11/20/17 0730  NA 131* 132*  K 4.8 4.0  CL 93* 91*  CO2 22 23  GLUCOSE 90 83  BUN 70* 43*  CREATININE 9.90* 8.09*  CALCIUM 9.2 8.7*   No results for input(s): PTH in the last 72 hours. Iron Studies: No results for input(s): IRON, TIBC, TRANSFERRIN, FERRITIN in the last 72 hours.  Studies/Results: No results found.  I have reviewed the patient's current medications.  Assessment/Plan: 1 ESRD Hd yest, improved solute/vol. Will do tiw 2 Anemia esa 3 HPTH vit D  4 DM controlled 5 Obesity 6 CM per Cards for CV 7 Afib 8 Gout P HD WED, CV, esa, vit D   LOS: 16 days   Hosey Burmester 11/20/2017,9:30 AM

## 2017-11-20 NOTE — Progress Notes (Signed)
RN receive verbal order from diabetes coordinator to d/c CBG protocol because pt is not diabetic.

## 2017-11-20 NOTE — Progress Notes (Signed)
Pt educated about safety and importance of bed alarm during the night however pt refuses to be on bed alarm.He is sitting on the chair at the sink with family members, refusing chair alarm as well "Stating I am ok".   Will continue to round on patient.   Joas Motton, RN

## 2017-11-20 NOTE — Progress Notes (Addendum)
Patient educated about importance of low bed in place due to previous fall this admission, however patient is refusing. Family member present at bedside. Patient agrees to be on bed alarm for now,bed is in the lowest position,he has non -skid yellow socks and yellow braclet on and room door open so he can been easily seen.   Will continue to monitor.  Sahasra Belue, RN

## 2017-11-20 NOTE — Progress Notes (Addendum)
ANTICOAGULATION CONSULT NOTE - Follow Up Consult  Pharmacy Consult for Apixaban Indication: atrial fibrillation  Allergies  Allergen Reactions  . Nsaids Other (See Comments)    Cannot take to due kidney issues  . Beta Adrenergic Blockers Other (See Comments)    "An unnamed, white-colored Beta Blocker made" him "feel funny" = made him feel cagey    Patient Measurements: Height: 5' 9.5" (176.5 cm) Weight: 267 lb 12.8 oz (121.5 kg) IBW/kg (Calculated) : 71.85 Heparin Dosing Weight: 104.9 kg  Vital Signs: Temp: 98.8 F (37.1 C) (04/30 0434) Temp Source: Oral (04/30 0434) BP: 88/65 (04/30 0434) Pulse Rate: 72 (04/30 0434)  Labs: Recent Labs    11/18/17 0424 11/18/17 1110 11/19/17 0610 11/19/17 0621  HGB 12.2* 11.5* 12.1*  --   HCT 40.9 38.2* 39.2  --   PLT 49* 57* 58*  --   CREATININE 6.52* 7.42*  --  9.90*    Estimated Creatinine Clearance: 10.7 mL/min (A) (by C-G formula based on SCr of 9.9 mg/dL (H)).  Assessment: 33 yoM admitted with A-flutter and s/p cardiac arrest on 4/22 due to hyperkalemia. Was on apixaban 5mg  BID PTA for afib but switched to heparin due to CVVHD, now on iHD. Pharmacy now consulted to transition back to apixaban.   Goal of Therapy:  Monitor platelets by anticoagulation protocol: Yes   Plan:  Continue apixaban 5mg  PO BID Monitor s/sx of bleeding Pharmacy will sign off as dose adjustments are not anticipated  Erin N. Zigmund Daniel, PharmD PGY1 Pharmacy Resident Pager: [phone] 11/20/2017 7:38 AM

## 2017-11-20 NOTE — Progress Notes (Signed)
Blood pressure "soft"  88/65 this morning. Patient asymptomatic, no complains or concerns.   Will continue to monitor.  Abhiraj Dozal, RN

## 2017-11-20 NOTE — Progress Notes (Signed)
Nutrition Follow-up  DOCUMENTATION CODES:   Morbid obesity  INTERVENTION:   -D/c Nepro -30 ml Prostat BID, each supplement provides 100 kcals and 15 grams protein  NUTRITION DIAGNOSIS:   Increased nutrient needs related to chronic illness as evidenced by estimated needs.  Ongoing  GOAL:   Patient will meet greater than or equal to 90% of their needs  Progressing  MONITOR:   PO intake, Supplement acceptance, Labs, Weight trends, Skin, I & O's  REASON FOR ASSESSMENT:   Ventilator    ASSESSMENT:   57 y.o. M with newly diagnosed ESRD admitted 11/03/17 for SOB. Pt had surgery to place dialysis catheter and fistula 11/12/17. Pt then went into cardiac arrest 11/12/17. Pt intubated with sedation and went on CRRT after intubation 11/12/17. PMH of HTN, HLD, CHF, morbid obesity, gout, CKD stg 3 - now ESRD, OSA, submandibular sialolithiasis, anemia of chronic disease, hyperglycemia, arthritis, and AICD.   11/14/17 - extubated 11/15/17 - SLP recommended regular diet with thin liquids 11/16/17 - CRRT will be discontinued, plan for iHD tomorrow  Reviewed I/O's: -1.3 L x 24 hours and -32 L since 11/06/17.   Attempted to speak with pt multiple times, however, unavailable at times of visit (at last visit, nurse tech was helping pt transfer to bathroom).   Intake has improved since prior visit. Meal completion 75-100%. Pt is refusing Nepro supplements. RD will d/c due to improved intake; will try Prostat for increased protein needs.  Labs reviewed: Na: 132, K WDL, Phos: 5, Mg: 2.6 (inpatient orders for glycemic control are 1-3 units insulin aspart every 4 hours).  Diet Order:   Diet Order           Diet NPO time specified  Diet effective midnight        Diet renal with fluid restriction Fluid restriction: 2000 mL Fluid; Room service appropriate? Yes; Fluid consistency: Thin  Diet effective now          EDUCATION NEEDS:   No education needs have been identified at this time  Skin:   Skin Assessment: Skin Integrity Issues: Skin Integrity Issues:: Incisions Incisions: R chest, R arm.   Last BM:  11/19/17  Height:   Ht Readings from Last 1 Encounters:  11/13/17 5' 9.5" (1.765 m)    Weight:   Wt Readings from Last 1 Encounters:  11/20/17 267 lb 12.8 oz (121.5 kg)    Ideal Body Weight:  74.09 kg  BMI:  Body mass index is 38.98 kg/m.  Estimated Nutritional Needs:   Kcal:  2000-2200  Protein:  105-120 grams  Fluid:  per MD    Nijah Orlich A. Mayford Knife, RD, LDN, CDE Pager: [phone] After hours Pager: [phone]

## 2017-11-20 NOTE — Progress Notes (Signed)
Oxygen remains stable on RA=95% Refusing CPAP.  Will continue to monitor.  Lahela Woodin, RN

## 2017-11-20 NOTE — Progress Notes (Addendum)
Patient ID: Eric Murillo, male   DOB: 1961/04/01, 57 y.o.   MRN: [id number]     Advanced Heart Failure Rounding Note  PCP-Cardiologist: [name], MD   Subjective:    Admitted with acute on chronic systolic CHF and AKI.   Had cardiac arrest 11/12/17 in setting of hyperkalemia. Intubated and emergently started on CVVHD.   Extubated 11/14/17.  Now off CVVHD and getting iHD.    HD yesterday with 2 L off. Weight is down 5 lbs.   Worked with PT and OT yesterday. No CP, SOB, dizziness. No blood in stool or urine. Oliguric.    Objective:   Weight Range: 267 lb 12.8 oz (121.5 kg) Body mass index is 38.98 kg/m.   Vital Signs:   Temp:  [97.9 F (36.6 C)-98.8 F (37.1 C)] 98.8 F (37.1 C) (04/30 0434) Pulse Rate:  [67-81] 72 (04/30 0434) Resp:  [17-18] 18 (04/30 0434) BP: (80-112)/(43-72) 88/65 (04/30 0434) SpO2:  [95 %-100 %] 98 % (04/30 0434) Weight:  [267 lb 12.8 oz (121.5 kg)-272 lb 14.9 oz (123.8 kg)] 267 lb 12.8 oz (121.5 kg) (04/30 0434) Last BM Date: 11/19/17  Weight change: Filed Weights   11/19/17 1910 11/19/17 2346 11/20/17 0434  Weight: 272 lb 14.9 oz (123.8 kg) 268 lb 8.3 oz (121.8 kg) 267 lb 12.8 oz (121.5 kg)    Intake/Output:   Intake/Output Summary (Last 24 hours) at 11/20/2017 0724 Last data filed at 11/20/2017 0541 Gross per 24 hour  Intake 720 ml  Output 2038 ml  Net -1318 ml      Physical Exam   General: No resp difficulty. HEENT: Normal Neck: Supple. JVP 8-10. Carotids 2+ bilat; no bruits. No thyromegaly or nodule noted. Cor: PMI nondisplaced. IRR, No M/G/R noted Lungs: CTAB, normal effort. Abdomen: Soft, non-tender, non-distended, no HSM. No bruits or masses. +BS  Extremities: No cyanosis, clubbing, or rash. R and LLE trace ankle edema.  Neuro: Alert & orientedx3, cranial nerves grossly intact. moves all 4 extremities w/o difficulty. Affect pleasant   Telemetry   A flutter 70s. Mostly 3:1 pattern. Personally reviewed.   EKG   No  new tracings  Labs    CBC Recent Labs    11/18/17 1110 11/19/17 0610  WBC 18.1* 17.9*  HGB 11.5* 12.1*  HCT 38.2* 39.2  MCV 85.7 84.8  PLT 57* 58*   Basic Metabolic Panel Recent Labs    78/29/56 0424 11/18/17 1110 11/19/17 0621  NA 132* 131* 131*  K 4.7 4.3 4.8  CL 93* 88* 93*  CO2 25 24 22   GLUCOSE 92 97 90  BUN 43* 51* 70*  CREATININE 6.52* 7.42* 9.90*  CALCIUM 8.8* 8.6* 9.2  MG 2.8*  --  3.1*  PHOS 6.0* 5.8* 7.5*   Liver Function Tests Recent Labs    11/18/17 1110 11/19/17 0621  AST  --  43*  ALT  --  64*  ALKPHOS  --  170*  BILITOT  --  1.4*  PROT  --  8.0  ALBUMIN 3.3* 3.3*  3.3*   No results for input(s): LIPASE, AMYLASE in the last 72 hours. Cardiac Enzymes No results for input(s): CKTOTAL, CKMB, CKMBINDEX, TROPONINI in the last 72 hours.  BNP: BNP (last 3 results) Recent Labs    07/26/17 0916 11/03/17 1238 11/13/17 0400  BNP 399.7* 1,160.1* 1,085.3*    ProBNP (last 3 results) No results for input(s): PROBNP in the last 8760 hours.   D-Dimer No results for input(s): DDIMER in the last  72 hours. Hemoglobin A1C No results for input(s): HGBA1C in the last 72 hours. Fasting Lipid Panel No results for input(s): CHOL, HDL, LDLCALC, TRIG, CHOLHDL, LDLDIRECT in the last 72 hours. Thyroid Function Tests No results for input(s): TSH, T4TOTAL, T3FREE, THYROIDAB in the last 72 hours.  Invalid input(s): FREET3  Other results:   Imaging    No results found.   Medications:     Scheduled Medications: . allopurinol  100 mg Oral BID  . amiodarone  200 mg Oral Daily  . apixaban  5 mg Oral BID  . calcium acetate  667 mg Oral TID WC  . Chlorhexidine Gluconate Cloth  6 each Topical Daily  . feeding supplement (NEPRO CARB STEADY)  237 mL Oral BID BM  . insulin aspart  1-3 Units Subcutaneous Q4H  . mouth rinse  15 mL Mouth Rinse q12n4p  . multivitamin  1 tablet Oral QHS  . sodium chloride flush  3 mL Intravenous Q12H    Infusions: .  sodium chloride    . sodium chloride    . sodium chloride 20 mL/hr at 11/20/17 0524    PRN Medications: acetaminophen, bisacodyl, docusate sodium, fentaNYL, ipratropium-albuterol, lidocaine (PF), sodium chloride flush  Assessment/Plan   1. Cardiac arrest: Had arrest on 4/22 due to hyperkalemia with subsequent VF.  - Much improved with CVVHD. Labs pending this am.  2. Acute on chronic systolic CHF: Nonischemic cardiomyopathy x years. 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  Last echo in 11/18 with moderate LV dilation, EF 20-25%, mildly decreased RV systolic function.  He had a workup for cardiac amyloidosis in the past.  However, negative SPEP and abdominal fat pad biopsy negative. Low voltage on ECG may be due to obesity and not amyloidosis. This admission, he has progressed to ESRD, now volume managed by HD.  Weight down considerably.  - Continue HD for volume removal.  - Tolerating iHD well - Continue low dose Coreg 3.125 mg bid on non-HD days.  3. Atypical atrial flutter: Recurrent. Most recent DCCV in 11/18.  Saw Dr Elberta Fortis, not thought to be a good ablation candidate. Remains in AFL. VRates 60-70s.  - Continue amiodarone 200 mg daily - Platelets dropped with heparin. Switched to apixaban. HIT panel sent => came back negative today. No CBC yet today => send.  - Now that volume status more controlled, will plan TEE-guided DCCV.  On schedule for 5/1 at 14:00. Discussed risks/benefits with patient, he agrees.  4. 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  Tunneled HD cath and AVF placed 4/22. Tolerating iHD well. Can use midodrine for BP support as needed.  - HD yesterday with 2 L off 5. OSA: Has been unable to tolerate CPAP.  No change. 6. Morbid obesity -Body mass index is 38.98 kg/m.  7. Hyperkalemia: Resolved with RRT. BMET pending this am.  8. Acute hypoxemic respiratory failure: Extubated 11/14/17. Stable on RA 9. Thrombocytopenia: Platelets dropped with heparin. Switched to apixaban.  HIT panel sent, still pending. No bleeding.  - Hemoglobin stable 12.1 yesterday. CBC pending this am.  10. ID: Completed course of zosyn 4/27. Afebrile. WBC elevated, but trending down. CBC pending this am.  11. Deconditioning - PT recommending HHPT. OT recommending 24 hour supervision + shower seat + 3 in 1 commode.   Mobilize with cardiac rehab.   Length of Stay: 76  Alford Highland, NP  7:24 AM   Advanced Heart Failure Team Pager [phone] (M-F; 7a - 4p)  Please  contact [facility] for night-coverage after hours (4p -7a ) and weekends on [email or web address]  Patient seen with NP, agree with the above note.  He is tolerating HD so far, volume coming off.  BP soft, he is only on Coreg on non-HD days.  He is on amiodarone and apixaban, plan for TEE-guided DCCV tomorrow at 2 pm, will need to dialyze either before or after.  Platelets have been low, HIT negative, not sure etiology.  Needs CBC today.   [name] 11/20/2017 8:18 AM

## 2017-11-20 NOTE — Progress Notes (Signed)
  Speech Language Pathology Treatment: Dysphagia  Patient Details Name: Eric Murillo MRN: [id number] DOB: Oct 31, 1960 Today's Date: 11/20/2017 Time: 1638-4536 SLP Time Calculation (min) (ACUTE ONLY): 11 min  Assessment / Plan / Recommendation Clinical Impression  Pt consumed regular textures and thin liquids independently without signs of dysphagia or aspiration. No signs concerning for aspiration upon review of chart and pt denies any subjective complaints. SLP reiterated education about general aspiration precautions. No further SLP f/u indicated - will sign off.   HPI HPI: Pt is a46 y.o.yo malewith chronic kidney disease stage IV, admitted with congestive heart failure exacerbation and worsening renal failure. Intubated 4/22-4/24. PMHx includes HTN, obstructive sleep apnea, CHF, and CKD. Not before seen by SLP. CXR (4/24) revealed mild bibasilar atelectasis/infiltrates, no interim change, and no definite effusions.      SLP Plan  All goals met       Recommendations  Diet recommendations: Regular;Thin liquid Liquids provided via: Cup;Straw Medication Administration: Whole meds with liquid Supervision: Patient able to self feed;Intermittent supervision to cue for compensatory strategies Compensations: Slow rate;Small sips/bites Postural Changes and/or Swallow Maneuvers: Seated upright 90 degrees                Oral Care Recommendations: Oral care BID Follow up Recommendations: None SLP Visit Diagnosis: Dysphagia, unspecified (R13.10) Plan: All goals met       GO                Germain Osgood 11/20/2017, 4:50 PM  Germain Osgood, M.A. CCC-SLP [phone]

## 2017-11-21 ENCOUNTER — Inpatient Hospital Stay (HOSPITAL_COMMUNITY): Payer: Medicaid Other | Admitting: Anesthesiology

## 2017-11-21 ENCOUNTER — Inpatient Hospital Stay (HOSPITAL_COMMUNITY): Payer: Medicaid Other

## 2017-11-21 ENCOUNTER — Encounter (HOSPITAL_COMMUNITY): Payer: Self-pay | Admitting: Anesthesiology

## 2017-11-21 ENCOUNTER — Encounter (HOSPITAL_COMMUNITY)
Admission: EM | Disposition: A | Discharge status: Home / Self Care | Payer: Self-pay | Source: Home / Self Care | Attending: Cardiology

## 2017-11-21 DIAGNOSIS — I340 Nonrheumatic mitral (valve) insufficiency: Secondary | ICD-10-CM

## 2017-11-21 DIAGNOSIS — I4892 Unspecified atrial flutter: Secondary | ICD-10-CM

## 2017-11-21 HISTORY — PX: CARDIOVERSION: SHX1299

## 2017-11-21 HISTORY — PX: TEE WITHOUT CARDIOVERSION: SHX5443

## 2017-11-21 LAB — CBC
HCT: 37.9 % — ABNORMAL LOW (ref 39.0–52.0)
HEMOGLOBIN: 11.8 g/dL — AB (ref 13.0–17.0)
MCH: 25.9 pg — ABNORMAL LOW (ref 26.0–34.0)
MCHC: 31.1 g/dL (ref 30.0–36.0)
MCV: 83.1 fL (ref 78.0–100.0)
PLATELETS: 79 10*3/uL — AB (ref 150–400)
RBC: 4.56 MIL/uL (ref 4.22–5.81)
RDW: 15.7 % — AB (ref 11.5–15.5)
WBC: 14.3 10*3/uL — ABNORMAL HIGH (ref 4.0–10.5)

## 2017-11-21 LAB — RENAL FUNCTION PANEL
ALBUMIN: 3.1 g/dL — AB (ref 3.5–5.0)
Anion gap: 17 — ABNORMAL HIGH (ref 5–15)
BUN: 63 mg/dL — AB (ref 6–20)
CALCIUM: 9 mg/dL (ref 8.9–10.3)
CO2: 24 mmol/L (ref 22–32)
Chloride: 90 mmol/L — ABNORMAL LOW (ref 101–111)
Creatinine, Ser: 9.89 mg/dL — ABNORMAL HIGH (ref 0.61–1.24)
GFR calc Af Amer: 6 mL/min — ABNORMAL LOW (ref >60)
GFR, EST NON AFRICAN AMERICAN: 5 mL/min — AB (ref >60)
Glucose, Bld: 92 mg/dL (ref 65–99)
PHOSPHORUS: 6.5 mg/dL — AB (ref 2.5–4.6)
POTASSIUM: 3.7 mmol/L (ref 3.5–5.1)
SODIUM: 131 mmol/L — AB (ref 135–145)

## 2017-11-21 LAB — SEROTONIN RELEASE ASSAY (SRA): SRA .2 IU/mL UFH Ser-aCnc: <1 % (ref 0–20)

## 2017-11-21 LAB — MAGNESIUM: MAGNESIUM: 2.7 mg/dL — AB (ref 1.7–2.4)

## 2017-11-21 SURGERY — ECHOCARDIOGRAM, TRANSESOPHAGEAL
Anesthesia: General

## 2017-11-21 MED ORDER — MEPERIDINE HCL 25 MG/ML IJ SOLN: 6.2500 mg | INTRAMUSCULAR | Status: DC | PRN

## 2017-11-21 MED ORDER — LIDOCAINE HCL (CARDIAC) PF 100 MG/5ML IV SOSY
PREFILLED_SYRINGE | INTRAVENOUS | Status: DC | PRN
  Administered 2017-11-21: 50 mg via INTRATRACHEAL

## 2017-11-21 MED ORDER — PROPOFOL 10 MG/ML IV BOLUS
INTRAVENOUS | Status: DC | PRN
Start: 2017-11-21 — End: 2017-11-21
  Administered 2017-11-21: 150 mg via INTRAVENOUS
  Administered 2017-11-21: 50 mg via INTRAVENOUS

## 2017-11-21 MED ORDER — PHENYLEPHRINE HCL 10 MG/ML IJ SOLN
INTRAVENOUS | Status: DC | PRN
  Administered 2017-11-21: 50 ug/min via INTRAVENOUS

## 2017-11-21 MED ORDER — ONDANSETRON HCL 4 MG/2ML IJ SOLN: 4.0000 mg | Freq: Once | INTRAMUSCULAR | Status: DC | PRN

## 2017-11-21 MED ORDER — ONDANSETRON HCL 4 MG/2ML IJ SOLN
INTRAMUSCULAR | Status: DC | PRN
  Administered 2017-11-21: 4 mg via INTRAVENOUS

## 2017-11-21 MED ORDER — SUCCINYLCHOLINE CHLORIDE 20 MG/ML IJ SOLN
INTRAMUSCULAR | Status: DC | PRN
  Administered 2017-11-21: 100 mg via INTRAVENOUS

## 2017-11-21 NOTE — Progress Notes (Signed)
PT Cancellation Note  Patient Details Name: Eric Murillo MRN: [id number] DOB: Nov 30, 1960   Cancelled Treatment:    Reason Eval/Treat Not Completed: Patient at procedure or test/unavailable (HD). Will follow-up for PT treatment as schedule permits.  Eric Murillo, PT, DPT Acute Rehab Services  Pager: [phone]  Malachy Chamber 11/21/2017, 7:29 AM

## 2017-11-21 NOTE — Anesthesia Postprocedure Evaluation (Signed)
Anesthesia Post Note  Patient: Eric Murillo  Procedure(s) Performed: TRANSESOPHAGEAL ECHOCARDIOGRAM (TEE) (N/A ) CARDIOVERSION (N/A )     Patient location during evaluation: PACU Anesthesia Type: General Level of consciousness: awake and alert and oriented Pain management: pain level controlled Vital Signs Assessment: post-procedure vital signs reviewed and stable Respiratory status: spontaneous breathing, nonlabored ventilation, respiratory function stable and patient connected to nasal cannula oxygen Cardiovascular status: blood pressure returned to baseline and stable Postop Assessment: no apparent nausea or vomiting Anesthetic complications: no    Last Vitals:  Vitals:   11/21/17 1339 11/21/17 1347  BP: 108/64 (!) 95/52  Pulse: 95 96  Resp: (!) 8 17  Temp: (!) 36.3 C   SpO2: 99% 97%    Last Pain:  Vitals:   11/21/17 1339  TempSrc: Oral  PainSc: 0-No pain                 Ngan Qualls A.

## 2017-11-21 NOTE — Anesthesia Preprocedure Evaluation (Addendum)
Anesthesia Evaluation  Patient identified by MRN, date of birth, ID band Patient awake    Reviewed: Allergy & Precautions, NPO status , Patient's Chart, lab work & pertinent test results, reviewed documented beta blocker date and time   Airway Mallampati: I  TM Distance: >3 FB Neck ROM: Full    Dental  (+) Poor Dentition   Pulmonary sleep apnea and Continuous Positive Airway Pressure Ventilation ,    Pulmonary exam normal breath sounds clear to auscultation       Cardiovascular hypertension, Pt. on medications and Pt. on home beta blockers +CHF  + Cardiac Defibrillator  Rhythm:Irregular Rate:Normal  Non Ischemic CM Echo 08/21/2014-  Left ventricle: The cavity size was severely dilated. Wall thickness was normal. Systolic function was severely reduced. Theestimated ejection fraction was in the range of 25% to 30%. - Mitral valve: There was mild to moderate regurgitation. - Left atrium: The atrium was severely dilated. - Pulmonary arteries: PA peak pressure: 53 mm Hg (S).   Neuro/Psych  Neuromuscular disease negative psych ROS   GI/Hepatic negative GI ROS, Neg liver ROS,   Endo/Other  Morbid obesityHyperlipidemia  Renal/GU Renal InsufficiencyRenal disease  negative genitourinary   Musculoskeletal  (+) Arthritis , Osteoarthritis,    Abdominal (+) + obese,   Peds  Hematology  (+) anemia , Eliquis- last dose yesterday pm   Anesthesia Other Findings   Reproductive/Obstetrics                          Anesthesia Physical  Anesthesia Plan  ASA: III  Anesthesia Plan: General   Post-op Pain Management:    Induction: Intravenous  PONV Risk Score and Plan: Treatment may vary due to age or medical condition, Ondansetron, Propofol infusion and Promethazine  Airway Management Planned: Oral ETT  Additional Equipment:   Intra-op Plan:   Post-operative Plan: Extubation in OR  Informed  Consent: I have reviewed the patients History and Physical, chart, labs and discussed the procedure including the risks, benefits and alternatives for the proposed anesthesia with the patient or authorized representative who has indicated his/her understanding and acceptance.   Dental advisory given  Plan Discussed with: Anesthesiologist, CRNA and Surgeon  Anesthesia Plan Comments:        Anesthesia Quick Evaluation

## 2017-11-21 NOTE — Progress Notes (Signed)
Physical Therapy Treatment Patient Details Name: Eric Murillo MRN: [id number] DOB: Sep 30, 1960 Today's Date: 11/21/2017    History of Present Illness Pt is a 57 y.o. male admitted 11/03/17 with cough and SOB, found to be volume overloaded and in A-flutter with controlled ventricular rate; worked up for HF and a-flutter. On 4/22, had wide-complex tacharrhythmia prompting ACLS, intubation and transfer to ICU; emergently started on CVVHD. Permanent RIJ HD cath and AVG placed 4/22. Extubated 4/24.  Remains on CRRT as of 4/25. S/p TEE and cardioversion 5/1. PMH includes AICD placement, OA, CHF, CKD, gout, HTN, morbid obesity, h/o cardiac cath, cardioversion, multiple TEE.    PT Comments    Pt progressing well with mobility. Amb 400' with no DME, only requiring supervision for balance. Educ on fall risk reduction, energy conservation, and importance of continued mobility. Encouraged to ambulate with supervision from RN/NT staff and/or family. Feel pt would benefit from outpatient cardiac rehab if he qualifies.   Follow Up Recommendations  Home health PT;Supervision - Intermittent(progress to outpatient Cardiopulmonary Rehab?)     Equipment Recommendations  None recommended by PT    Recommendations for Other Services       Precautions / Restrictions Precautions Precautions: Fall Restrictions Weight Bearing Restrictions: No    Mobility  Bed Mobility               General bed mobility comments: Seated at EOB  Transfers Overall transfer level: Needs assistance Equipment used: None Transfers: Sit to/from Stand Sit to Stand: Supervision            Ambulation/Gait Ambulation/Gait assistance: Supervision Ambulation Distance (Feet): 400 Feet Assistive device: None Gait Pattern/deviations: Step-through pattern;Wide base of support Gait velocity: Decreased Gait velocity interpretation: <1.8 ft/sec, indicate of risk for recurrent falls General Gait Details: Slow, controlled amb  with wide BOS; increased BLE swelling likely contributing to waddling-type gait   Stairs Stairs: Yes Stairs assistance: Supervision Stair Management: One rail Right;Forwards Number of Stairs: 2 General stair comments: Simulated ascending 2 steps by high marching with RUE support on rail and supervision for safety   Wheelchair Mobility    Modified Rankin (Stroke Patients Only)       Balance Overall balance assessment: Needs assistance Sitting-balance support: Feet supported Sitting balance-Leahy Scale: Good     Standing balance support: No upper extremity supported;During functional activity Standing balance-Leahy Scale: Good               High level balance activites: Backward walking;Side stepping;Direction changes;Turns High Level Balance Comments: No overt instability; supervision for safety            Cognition Arousal/Alertness: Awake/alert Behavior During Therapy: WFL for tasks assessed/performed Overall Cognitive Status: Within Functional Limits for tasks assessed                                        Exercises      General Comments General comments (skin integrity, edema, etc.): Sister present throughout session. HR 93-107      Pertinent Vitals/Pain Pain Assessment: No/denies pain    Home Living                      Prior Function            PT Goals (current goals can now be found in the care plan section) Acute Rehab PT Goals Patient Stated Goal: Return home and  lose weight PT Goal Formulation: With patient Time For Goal Achievement: 11/29/17 Potential to Achieve Goals: Good Progress towards PT goals: Progressing toward goals    Frequency    Min 3X/week      PT Plan Current plan remains appropriate    Co-evaluation              AM-PAC PT "6 Clicks" Daily Activity  Outcome Measure  Difficulty turning over in bed (including adjusting bedclothes, sheets and blankets)?: None Difficulty moving  from lying on back to sitting on the side of the bed? : None Difficulty sitting down on and standing up from a chair with arms (e.g., wheelchair, bedside commode, etc,.)?: None Help needed moving to and from a bed to chair (including a wheelchair)?: A Little Help needed walking in hospital room?: A Little Help needed climbing 3-5 steps with a railing? : A Little 6 Click Score: 21    End of Session Equipment Utilized During Treatment: Gait belt Activity Tolerance: Patient tolerated treatment well Patient left: in bed;with family/visitor present;with call bell/phone within reach(sitting EOB) Nurse Communication: Mobility status PT Visit Diagnosis: Unsteadiness on feet (R26.81);Muscle weakness (generalized) (M62.81);Difficulty in walking, not elsewhere classified (R26.2)     Time: 0349-1791 PT Time Calculation (min) (ACUTE ONLY): 22 min  Charges:  $Gait Training: 8-22 mins                    G Codes:      Ina Homes, PT, DPT Acute Rehab Services  Pager: [phone]  Malachy Chamber 11/21/2017, 5:42 PM

## 2017-11-21 NOTE — Procedures (Signed)
Electrical Cardioversion Procedure Note Carolos Harland [id number] 09-25-1960  Procedure: Electrical Cardioversion Indications:  Atrial Flutter  Procedure Details Consent: Risks of procedure as well as the alternatives and risks of each were explained to the (patient/caregiver).  Consent for procedure obtained. Time Out: Verified patient identification, verified procedure, site/side was marked, verified correct patient position, special equipment/implants available, medications/allergies/relevent history reviewed, required imaging and test results available.  Performed  Patient placed on cardiac monitor, pulse oximetry, supplemental oxygen as necessary.  Sedation given: General anesthesia Pacer pads placed anterior and posterior chest.  Cardioverted 1 time(s).  Cardioverted at 150J.  Evaluation Findings: Post procedure EKG shows: Type I 2nd degree AV block.  Complications: None Patient did tolerate procedure well.   [name] 11/21/2017, 1:28 PM

## 2017-11-21 NOTE — Progress Notes (Signed)
Per CCMD patient converted back to Atrial flutter. VS stable, rate controled 85 bpm.  EKG completed, in Epic, shows NSR with 1st degree AV block. Paged MD cardiology Vedre and let him know to review an EKG. Per MD EKG shows NSR. No new orders received at this time. Will continue to monitor.  Shevette Bess, RN

## 2017-11-21 NOTE — CV Procedure (Signed)
Procedure: TEE  Indication: Atrial flutter  Sedation: General anesthesia  Findings: Please see echo section for full report.  Mildly dilated LV with normal wall thickness.  EF 25-30%, diffuse hypokinesis.  Normal RV size with mildly decreased systolic function.  ICD in RV.  Moderate left atrial enlargement, no LA appendage thrombus.  Mild right atrial enlargement.  Mild TR.  Mild to moderate MR.  Trileaflet aortic valve with no regurgitation or stenosis.  No ASD or PFO by color doppler.  HD catheter noted in SVC.  The ascending aorta was normal in caliber.  No LA appendage thrombus, may proceed to DCCV.   Eric Murillo 11/21/2017 1:28 PM

## 2017-11-21 NOTE — Transfer of Care (Signed)
Immediate Anesthesia Transfer of Care Note  Patient: Eric Murillo  Procedure(s) Performed: TRANSESOPHAGEAL ECHOCARDIOGRAM (TEE) (N/A ) CARDIOVERSION (N/A )  Patient Location: Endoscopy Unit  Anesthesia Type:General  Level of Consciousness: awake, alert  and oriented  Airway & Oxygen Therapy: Patient Spontanous Breathing and Patient connected to face mask  Post-op Assessment: Report given to RN, Post -op Vital signs reviewed and stable and Patient moving all extremities X 4  Post vital signs: Reviewed and stable  Last Vitals:  Vitals Value Taken Time  BP 108/64 11/21/2017  1:39 PM  Temp 36.3 C 11/21/2017  1:39 PM  Pulse 96 11/21/2017  1:43 PM  Resp 12 11/21/2017  1:44 PM  SpO2 96 % 11/21/2017  1:43 PM  Vitals shown include unvalidated device data.  Last Pain:  Vitals:   11/21/17 1339  TempSrc: Oral  PainSc: 0-No pain      Patients Stated Pain Goal: 2 (11/17/17 1940)  Complications: No apparent anesthesia complications

## 2017-11-21 NOTE — Anesthesia Procedure Notes (Signed)
Procedure Name: Intubation Date/Time: 11/21/2017 1:05 PM Performed by: Marena Chancy, CRNA Pre-anesthesia Checklist: Patient identified, Emergency Drugs available, Suction available and Patient being monitored Patient Re-evaluated:Patient Re-evaluated prior to induction Oxygen Delivery Method: Circle System Utilized Preoxygenation: Pre-oxygenation with 100% oxygen Induction Type: IV induction Ventilation: Mask ventilation without difficulty Laryngoscope Size: Glidescope and 4 Grade View: Grade I Tube type: Oral Tube size: 7.5 mm Number of attempts: 1 Airway Equipment and Method: Stylet and Oral airway Placement Confirmation: ETT inserted through vocal cords under direct vision,  positive ETCO2 and breath sounds checked- equal and bilateral Tube secured with: Tape Dental Injury: Teeth and Oropharynx as per pre-operative assessment

## 2017-11-21 NOTE — Procedures (Signed)
I was present at this session.  I have reviewed the session itself and made appropriate changes.  HD via RIJ PC. bp in 120s, tol well.  Eric Murillo 5/1/20198:19 AM

## 2017-11-21 NOTE — Progress Notes (Signed)
Patient still refusing bed alarm.  Will continue to monitor.  Hattie Pine, RN

## 2017-11-21 NOTE — Progress Notes (Signed)
Patient ID: Eric Murillo, male   DOB: 1960/08/24, 57 y.o.   MRN: [id number]     Advanced Heart Failure Rounding Note  PCP-Cardiologist: [name], MD   Subjective:    Admitted with acute on chronic systolic CHF and AKI.   Had cardiac arrest 11/12/17 in setting of hyperkalemia. Intubated and emergently started on CVVHD.   Extubated 11/14/17.  Now off CVVHD and getting iHD.    He is in HD this morning.  Weight down around 60 lbs from peak.  He is tolerating HD well.    Objective:   Weight Range: 272 lb 4.3 oz (123.5 kg) Body mass index is 39.63 kg/m.   Vital Signs:   Temp:  [97.7 F (36.5 C)-98.4 F (36.9 C)] 97.7 F (36.5 C) (05/01 0704) Pulse Rate:  [69-100] 71 (05/01 0800) Resp:  [11-26] 12 (05/01 0800) BP: (93-125)/(59-96) 120/96 (05/01 0800) SpO2:  [96 %-99 %] 98 % (05/01 0704) Weight:  [271 lb 12.8 oz (123.3 kg)-272 lb 4.3 oz (123.5 kg)] 272 lb 4.3 oz (123.5 kg) (05/01 0704) Last BM Date: 11/20/17  Weight change: Filed Weights   11/20/17 0434 11/21/17 0502 11/21/17 0704  Weight: 267 lb 12.8 oz (121.5 kg) 271 lb 12.8 oz (123.3 kg) 272 lb 4.3 oz (123.5 kg)    Intake/Output:   Intake/Output Summary (Last 24 hours) at 11/21/2017 0840 Last data filed at 11/21/2017 0558 Gross per 24 hour  Intake 615 ml  Output 0 ml  Net 615 ml      Physical Exam   General: NAD Neck: No JVD, no thyromegaly or thyroid nodule.  Lungs: Clear to auscultation bilaterally with normal respiratory effort. CV: Nondisplaced PMI.  Heart irregular S1/S2, no S3/S4, no murmur.  No peripheral edema.   Abdomen: Soft, nontender, no hepatosplenomegaly, no distention.  Skin: Intact without lesions or rashes.  Neurologic: Alert and oriented x 3.  Psych: Normal affect. Extremities: No clubbing or cyanosis.  HEENT: Normal.   Telemetry   Atypical atrial flutter in 70s, personally reviewed.   EKG   No new tracings  Labs    CBC Recent Labs    11/20/17 0730 11/21/17 0410  WBC 13.8*  14.3*  HGB 10.3* 11.8*  HCT 32.9* 37.9*  MCV 84.6 83.1  PLT 51* 79*   Basic Metabolic Panel Recent Labs    78/29/56 0730 11/21/17 0410  NA 132* 131*  K 4.0 3.7  CL 91* 90*  CO2 23 24  GLUCOSE 83 92  BUN 43* 63*  CREATININE 8.09* 9.89*  CALCIUM 8.7* 9.0  MG 2.6* 2.7*  PHOS 5.0* 6.5*   Liver Function Tests Recent Labs    11/19/17 0621 11/20/17 0730 11/21/17 0410  AST 43*  --   --   ALT 64*  --   --   ALKPHOS 170*  --   --   BILITOT 1.4*  --   --   PROT 8.0  --   --   ALBUMIN 3.3*  3.3* 3.2* 3.1*   No results for input(s): LIPASE, AMYLASE in the last 72 hours. Cardiac Enzymes No results for input(s): CKTOTAL, CKMB, CKMBINDEX, TROPONINI in the last 72 hours.  BNP: BNP (last 3 results) Recent Labs    07/26/17 0916 11/03/17 1238 11/13/17 0400  BNP 399.7* 1,160.1* 1,085.3*    ProBNP (last 3 results) No results for input(s): PROBNP in the last 8760 hours.   D-Dimer No results for input(s): DDIMER in the last 72 hours. Hemoglobin A1C No results for input(s): HGBA1C  in the last 72 hours. Fasting Lipid Panel No results for input(s): CHOL, HDL, LDLCALC, TRIG, CHOLHDL, LDLDIRECT in the last 72 hours. Thyroid Function Tests No results for input(s): TSH, T4TOTAL, T3FREE, THYROIDAB in the last 72 hours.  Invalid input(s): FREET3  Other results:   Imaging    No results found.   Medications:     Scheduled Medications: . allopurinol  100 mg Oral BID  . amiodarone  200 mg Oral Daily  . apixaban  5 mg Oral BID  . calcitRIOL  0.5 mcg Oral QODAY  . calcium acetate  667 mg Oral TID WC  . carvedilol  3.125 mg Oral 2 times per day on Sun Tue Thu Sat  . Chlorhexidine Gluconate Cloth  6 each Topical Daily  . feeding supplement (PRO-STAT SUGAR FREE 64)  30 mL Oral BID  . mouth rinse  15 mL Mouth Rinse q12n4p  . multivitamin  1 tablet Oral QHS  . sodium chloride flush  3 mL Intravenous Q12H    Infusions: . sodium chloride    . sodium chloride    .  sodium chloride 20 mL/hr at 11/20/17 0524    PRN Medications: acetaminophen, bisacodyl, docusate sodium, fentaNYL, ipratropium-albuterol, sodium chloride flush  Assessment/Plan   1. Cardiac arrest: Had arrest on 4/22 due to hyperkalemia with subsequent VF.  2. Acute on chronic systolic CHF: Nonischemic cardiomyopathy x years. 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  Last echo in 11/18 with moderate LV dilation, EF 20-25%, mildly decreased RV systolic function.  He had a workup for cardiac amyloidosis in the past.  However, negative SPEP and abdominal fat pad biopsy negative. Low voltage on ECG may be due to obesity and not amyloidosis. This admission, he has progressed to ESRD, now volume managed by HD.  Weight down about 60 lbs from peak.  - Continue HD for volume removal.  - Continue low dose Coreg 3.125 mg bid on non-HD days.  3. Atypical atrial flutter: Recurrent. Most recent DCCV in 11/18.  Saw Dr Elberta Fortis, not thought to be a good ablation candidate. Remains in AFL. VRates 60-70s.  - Continue amiodarone 200 mg daily - Platelets dropped with heparin. Switched to apixaban. HIT panel sent => came back negative. Platelets now rising.  - Now that volume status more controlled, will plan TEE-guided DCCV.  On schedule for today at 14:00. Discussed risks/benefits with patient, he agrees.  4. 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  Tunneled HD cath and AVF placed 4/22. Tolerating iHD well. Can use midodrine for BP support as needed.  - HD today.  5. OSA: Has been unable to tolerate CPAP.  No change. 6. Morbid obesity -Body mass index is 39.63 kg/m.  7. Hyperkalemia: Resolved with RRT. BMET pending this am.  8. Acute hypoxemic respiratory failure: Extubated 11/14/17. Stable on RA 9. Thrombocytopenia: Platelets dropped with heparin. Switched to apixaban. HIT panel sent, negative. No bleeding. Platelets now trending up again.   10. ID: Completed course of zosyn 4/27. Afebrile.  11. Deconditioning -  PT recommending HHPT. OT recommending 24 hour supervision + shower seat + 3 in 1 commode.   Mobilize with cardiac rehab.   Length of Stay: 17  [name], MD  8:40 AM   Advanced Heart Failure Team Pager [phone] (M-F; 7a - 4p)  Please contact [facility] for night-coverage after hours (4p -7a ) and weekends on [email or web address]

## 2017-11-21 NOTE — Progress Notes (Signed)
Subjective: Interval History: has no complaint .  Anxious for home arrangements.  Objective: Vital signs in last 24 hours: Temp:  [97.7 F (36.5 C)-98.4 F (36.9 C)] 97.7 F (36.5 C) (05/01 0704) Pulse Rate:  [69-100] 71 (05/01 0800) Resp:  [11-26] 12 (05/01 0800) BP: (93-125)/(59-96) 120/96 (05/01 0800) SpO2:  [96 %-99 %] 98 % (05/01 0704) Weight:  [123.3 kg (271 lb 12.8 oz)-123.5 kg (272 lb 4.3 oz)] 123.5 kg (272 lb 4.3 oz) (05/01 0704) Weight change: -0.512 kg (-1 lb 2.1 oz)  Intake/Output from previous day: 04/30 0701 - 05/01 0700 In: 615 [P.O.:360; I.V.:255] Out: 0  Intake/Output this shift: No intake/output data recorded.  General appearance: alert, cooperative, no distress and moderately obese Resp: diminished breath sounds bilaterally Chest wall: RIJ cath Cardio: irregularly irregular rhythm, S1, S2 normal and systolic murmur: holosystolic 2/6, blowing at apex GI: obese, pos bs, liver down 5 cm Extremities: aVF RUA  Lab Results: Recent Labs    11/20/17 0730 11/21/17 0410  WBC 13.8* 14.3*  HGB 10.3* 11.8*  HCT 32.9* 37.9*  PLT 51* 79*   BMET:  Recent Labs    11/20/17 0730 11/21/17 0410  NA 132* 131*  K 4.0 3.7  CL 91* 90*  CO2 23 24  GLUCOSE 83 92  BUN 43* 63*  CREATININE 8.09* 9.89*  CALCIUM 8.7* 9.0   No results for input(s): PTH in the last 72 hours. Iron Studies: No results for input(s): IRON, TIBC, TRANSFERRIN, FERRITIN in the last 72 hours.  Studies/Results: No results found.  I have reviewed the patient's current medications.  Assessment/Plan: 1 ESRD for HD.  tol well. Has been CLIPPED to [place] 2 Obesity 3 Anemia esa 4 Afib 5 CHF controlled 6 HPTH vit D P HD, esa, vit D    LOS: 17 days   Fayrene Fearing Elianah Karis 11/21/2017,8:20 AM

## 2017-11-22 LAB — RENAL FUNCTION PANEL
Albumin: 3.5 g/dL (ref 3.5–5.0)
Anion gap: 16 — ABNORMAL HIGH (ref 5–15)
BUN: 53 mg/dL — ABNORMAL HIGH (ref 6–20)
CALCIUM: 9.5 mg/dL (ref 8.9–10.3)
CO2: 22 mmol/L (ref 22–32)
CREATININE: 9.13 mg/dL — AB (ref 0.61–1.24)
Chloride: 97 mmol/L — ABNORMAL LOW (ref 101–111)
GFR calc Af Amer: 7 mL/min — ABNORMAL LOW (ref >60)
GFR calc non Af Amer: 6 mL/min — ABNORMAL LOW (ref >60)
GLUCOSE: 100 mg/dL — AB (ref 65–99)
Phosphorus: 5.8 mg/dL — ABNORMAL HIGH (ref 2.5–4.6)
Potassium: 4.8 mmol/L (ref 3.5–5.1)
SODIUM: 135 mmol/L (ref 135–145)

## 2017-11-22 LAB — CBC
HCT: 40.1 % (ref 39.0–52.0)
Hemoglobin: 12.7 g/dL — ABNORMAL LOW (ref 13.0–17.0)
MCH: 26.6 pg (ref 26.0–34.0)
MCHC: 31.7 g/dL (ref 30.0–36.0)
MCV: 83.9 fL (ref 78.0–100.0)
PLATELETS: 90 10*3/uL — AB (ref 150–400)
RBC: 4.78 MIL/uL (ref 4.22–5.81)
RDW: 16.3 % — ABNORMAL HIGH (ref 11.5–15.5)
WBC: 13.3 10*3/uL — ABNORMAL HIGH (ref 4.0–10.5)

## 2017-11-22 LAB — MAGNESIUM: Magnesium: 2.7 mg/dL — ABNORMAL HIGH (ref 1.7–2.4)

## 2017-11-22 MED ORDER — CALCIUM ACETATE (PHOS BINDER) 667 MG PO CAPS
1334.0000 mg | ORAL_CAPSULE | Freq: Three times a day (TID) | ORAL | Status: DC
  Administered 2017-11-22: 1334 mg via ORAL
  Filled 2017-11-22: qty 2

## 2017-11-22 MED ORDER — CALCITRIOL 0.5 MCG PO CAPS: 0.5000 ug | ORAL_CAPSULE | ORAL | 0 refills | Status: DC

## 2017-11-22 MED ORDER — AMIODARONE HCL 200 MG PO TABS: 200.0000 mg | ORAL_TABLET | Freq: Every day | ORAL | 6 refills | Status: DC

## 2017-11-22 MED ORDER — CALCIUM ACETATE (PHOS BINDER) 667 MG PO CAPS: 1334.0000 mg | ORAL_CAPSULE | Freq: Three times a day (TID) | ORAL | 0 refills | Status: DC

## 2017-11-22 MED ORDER — PRO-STAT SUGAR FREE PO LIQD: 30.0000 mL | Freq: Two times a day (BID) | ORAL | 0 refills | Status: DC

## 2017-11-22 MED ORDER — CARVEDILOL 3.125 MG PO TABS: 3.1250 mg | ORAL_TABLET | Freq: Two times a day (BID) | ORAL | 6 refills | Status: AC

## 2017-11-22 MED ORDER — RENA-VITE PO TABS: 1.0000 | ORAL_TABLET | Freq: Every day | ORAL | 6 refills | Status: AC

## 2017-11-22 NOTE — Progress Notes (Addendum)
Patient ID: Eric Murillo, male   DOB: 11-Aug-1960, 57 y.o.   MRN: [id number]     Advanced Heart Failure Rounding Note  PCP-Cardiologist: [name], MD   Subjective:    Admitted with acute on chronic systolic CHF and AKI.   Had cardiac arrest 11/12/17 in setting of hyperkalemia. Intubated and emergently started on CVVHD.   Extubated 11/14/17.  Now off CVVHD and getting iHD.    HD yesterday with 2.6 L off.  TEE/DCCV 11/21/17. Remains in NSR.  Feels good this morning. More energy now that he's in NSR. Up and walking around the room. No CP/SOB. Asking when he can go home.    Objective:   Weight Range: 267 lb 2 oz (121.2 kg) Body mass index is 38.88 kg/m.   Vital Signs:   Temp:  [97.4 F (36.3 C)-99.1 F (37.3 C)] 98.3 F (36.8 C) (05/02 0643) Pulse Rate:  [69-100] 93 (05/02 0643) Resp:  [8-22] 18 (05/02 0646) BP: (76-143)/(52-96) 98/69 (05/02 0643) SpO2:  [91 %-100 %] 94 % (05/02 0643) Weight:  [264 lb 8.8 oz (120 kg)-267 lb 2 oz (121.2 kg)] 267 lb 2 oz (121.2 kg) (05/02 0426) Last BM Date: 11/21/17  Weight change: Filed Weights   11/21/17 0704 11/21/17 1113 11/22/17 0426  Weight: 272 lb 4.3 oz (123.5 kg) 264 lb 8.8 oz (120 kg) 267 lb 2 oz (121.2 kg)    Intake/Output:   Intake/Output Summary (Last 24 hours) at 11/22/2017 0728 Last data filed at 11/22/2017 0600 Gross per 24 hour  Intake 1150 ml  Output 2666 ml  Net -1516 ml      Physical Exam   General: WNo resp difficulty. HEENT: Normal Neck: Supple. JVP ~10. Carotids 2+ bilat; no bruits. No thyromegaly or nodule noted. Cor: PMI nondisplaced. RRR, No M/G/R noted Lungs: CTAB, normal effort. Abdomen: Soft, non-tender, non-distended, no HSM. No bruits or masses. +BS  Extremities: No cyanosis, clubbing, or rash. R and LLE no edema.  Neuro: Alert & orientedx3, cranial nerves grossly intact. moves all 4 extremities w/o difficulty. Affect pleasant   Telemetry   SR 80-90s with 1st degree AV block  EKG    No new tracings  Labs    CBC Recent Labs    11/20/17 0730 11/21/17 0410  WBC 13.8* 14.3*  HGB 10.3* 11.8*  HCT 32.9* 37.9*  MCV 84.6 83.1  PLT 51* 79*   Basic Metabolic Panel Recent Labs    21/11/55 0730 11/21/17 0410  NA 132* 131*  K 4.0 3.7  CL 91* 90*  CO2 23 24  GLUCOSE 83 92  BUN 43* 63*  CREATININE 8.09* 9.89*  CALCIUM 8.7* 9.0  MG 2.6* 2.7*  PHOS 5.0* 6.5*   Liver Function Tests Recent Labs    11/20/17 0730 11/21/17 0410  ALBUMIN 3.2* 3.1*   No results for input(s): LIPASE, AMYLASE in the last 72 hours. Cardiac Enzymes No results for input(s): CKTOTAL, CKMB, CKMBINDEX, TROPONINI in the last 72 hours.  BNP: BNP (last 3 results) Recent Labs    07/26/17 0916 11/03/17 1238 11/13/17 0400  BNP 399.7* 1,160.1* 1,085.3*    ProBNP (last 3 results) No results for input(s): PROBNP in the last 8760 hours.   D-Dimer No results for input(s): DDIMER in the last 72 hours. Hemoglobin A1C No results for input(s): HGBA1C in the last 72 hours. Fasting Lipid Panel No results for input(s): CHOL, HDL, LDLCALC, TRIG, CHOLHDL, LDLDIRECT in the last 72 hours. Thyroid Function Tests No results for input(s): TSH, T4TOTAL,  T3FREE, THYROIDAB in the last 72 hours.  Invalid input(s): FREET3  Other results:   Imaging    No results found.   Medications:     Scheduled Medications: . allopurinol  100 mg Oral BID  . amiodarone  200 mg Oral Daily  . apixaban  5 mg Oral BID  . calcitRIOL  0.5 mcg Oral QODAY  . calcium acetate  667 mg Oral TID WC  . carvedilol  3.125 mg Oral 2 times per day on Sun Tue Thu Sat  . Chlorhexidine Gluconate Cloth  6 each Topical Daily  . feeding supplement (PRO-STAT SUGAR FREE 64)  30 mL Oral BID  . mouth rinse  15 mL Mouth Rinse q12n4p  . multivitamin  1 tablet Oral QHS  . sodium chloride flush  3 mL Intravenous Q12H    Infusions: . sodium chloride      PRN Medications: acetaminophen, bisacodyl, docusate sodium,  fentaNYL, ipratropium-albuterol, ondansetron (ZOFRAN) IV, sodium chloride flush  Assessment/Plan   1. Cardiac arrest: Had arrest on 4/22 due to hyperkalemia with subsequent VF.  2. Acute on chronic systolic CHF: Nonischemic cardiomyopathy x years. 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  Last echo in 11/18 with moderate LV dilation, EF 20-25%, mildly decreased RV systolic function.  He had a workup for cardiac amyloidosis in the past.  However, negative SPEP and abdominal fat pad biopsy negative. Low voltage on ECG may be due to obesity and not amyloidosis. This admission, he has progressed to ESRD, now volume managed by HD.  Weight down about 60 lbs from peak. EF 25-30% on TEE 5/1 - Continue HD for volume removal.  - Continue low dose Coreg 3.125 mg bid on non-HD days.  3. Atypical atrial flutter: Recurrent. Most recent DCCV in 11/18.  Saw Dr Elberta Fortis, not thought to be a good ablation candidate.  - Continue amiodarone 200 mg daily - Platelets dropped with heparin. Switched to apixaban. HIT panel sent => came back negative. Platelets now rising.  - Underwent successful TEE/DCCV 11/21/17, in NSR today.  4. 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  Tunneled HD cath and AVF placed 4/22. Tolerating iHD well. Can use midodrine for BP support as needed.  - HD per renal 5. OSA: Has been unable to tolerate CPAP.  No change.  6. Morbid obesity -Body mass index is 38.88 kg/m.  7. Hyperkalemia: Resolved with RRT. BMET pending.  8. Acute hypoxemic respiratory failure: Extubated 11/14/17. Stable on RA 9. Thrombocytopenia: Platelets dropped with heparin. Switched to apixaban. HIT panel sent, negative. No bleeding. Platelets now trending up again.   CBC pending this am.  10. ID: Completed course of zosyn 4/27. Afebrile. No change.  11. Deconditioning - PT recommending HHPT. OT recommending 24 hour supervision + shower seat + 3 in 1 commode. No change.   Mobilize with cardiac rehab.   Length of Stay:  32  Alford Highland, NP  7:28 AM   Advanced Heart Failure Team Pager [phone] (M-F; 7a - 4p)  Please contact [facility] for night-coverage after hours (4p -7a ) and weekends on [email or web address]  Patient seen with NP, agree with the above note.  His weight is down considerably, now walking in halls without significant dyspnea.  He is not volume overloaded on exam.    He was cardioverted yesterday, remains in NSR today.   I think that he can go home today.  Will need to start outpatient HD tomorrow.  Meds for discharge: amiodarone 200  daily, apixaban 5 bid, Coreg 3.125 mg bid on non-HD days.  He will need followup in CHF clinic in 2 wks or so.   [name] 11/22/2017 10:42 AM

## 2017-11-22 NOTE — Progress Notes (Signed)
Subjective: Interval History: has no complaint, just not sure ready to go.  Objective: Vital signs in last 24 hours: Temp:  [97.4 F (36.3 C)-99.1 F (37.3 C)] 98.3 F (36.8 C) (05/02 0643) Pulse Rate:  [69-96] 93 (05/02 0643) Resp:  [8-22] 18 (05/02 0646) BP: (76-143)/(52-94) 98/69 (05/02 0643) SpO2:  [91 %-100 %] 94 % (05/02 0643) Weight:  [120 kg (264 lb 8.8 oz)-121.2 kg (267 lb 2 oz)] 121 kg (266 lb 11.2 oz) (05/02 0750) Weight change: 0.212 kg (7.5 oz)  Intake/Output from previous day: 05/01 0701 - 05/02 0700 In: 1150 [P.O.:600; I.V.:550] Out: 2666  Intake/Output this shift: No intake/output data recorded.  General appearance: alert, cooperative, no distress and morbidly obese Resp: diminished breath sounds bilaterally Chest wall: IJ Cath Cardio: S1, S2 normal and systolic murmur: holosystolic 2/6, blowing at apex GI: obese, pos bs, liver down 6 cm Extremities: edema Tr  and AVF RuA  Lab Results: Recent Labs    11/20/17 0730 11/21/17 0410  WBC 13.8* 14.3*  HGB 10.3* 11.8*  HCT 32.9* 37.9*  PLT 51* 79*   BMET:  Recent Labs    11/20/17 0730 11/21/17 0410  NA 132* 131*  K 4.0 3.7  CL 91* 90*  CO2 23 24  GLUCOSE 83 92  BUN 43* 63*  CREATININE 8.09* 9.89*  CALCIUM 8.7* 9.0   No results for input(s): PTH in the last 72 hours. Iron Studies: No results for input(s): IRON, TIBC, TRANSFERRIN, FERRITIN in the last 72 hours.  Studies/Results: No results found.  I have reviewed the patient's current medications.  Assessment/Plan: 1 ESRD HD tomorrow.  Can do outpatient. Vol improved 2 Anemia esa as needed 3 HPTH vit D , ^ binders 4 Obesity 5 CM stable 6 Afib now NSR P Hd, ^ binder , S/c per primary   LOS: 18 days   Eric Murillo 11/22/2017,8:33 AM

## 2017-11-22 NOTE — Progress Notes (Signed)
CARDIAC REHAB PHASE I   PRE:  Rate/Rhythm: Sinus rhythm 90  BP:    Sitting: 110/84     SaO2: 97% Room Air  MODE:  Ambulation: 470 ft   POST:  Rate/Rhythm: 98  BP:    Sitting: 110/84     SaO2: 97% Room air  1350-1345Patient ambulated independently in the hallway without complaints or shortness of breath. Reviewed, heart failure booklet, exercise guidelines and gave low sodium handout to patient to review as he has just met with the dietitian. Eric Murillo is interested in participating in phase 2 cardiac rehab in [place] but may consider participating in phase 2 cardiac rehab as his girlfriend lives in [place].  Harrell Gave RN BSN

## 2017-11-22 NOTE — Plan of Care (Signed)
Nutrition Education Note  RD consulted for Renal Education.   Spoke with pt, who reports good appetite. He consumed about 75% of lunch tray. He has been monitoring meal trays as examples of renal diet. He reports following a low sodium diet at home and interested in "knowing how to eat right". He was very engaged during visit and asked good questions.   Provided "Food Pyramid of Healthy Eating with Kidney Disease" to patient/family. Reviewed food groups and provided written recommended serving sizes specifically determined for patient's current nutritional status.   Explained why diet restrictions are needed and provided lists of foods to limit/avoid that are high potassium, sodium, and phosphorus. Provided specific recommendations on safer alternatives of these foods. Strongly encouraged compliance of this diet.   Discussed importance of protein intake at each meal and snack. Provided examples of how to maximize protein intake throughout the day. Discussed need for fluid restriction with dialysis, importance of minimizing weight gain between HD treatments, and renal-friendly beverage options.  Encouraged pt to discuss specific diet questions/concerns with RD at HD outpatient facility. Teach back method used.  Expect fair to good compliance.  Body mass index is 38.82 kg/m. Pt meets criteria for obesity, class II based on current BMI.  Current diet order is renal with 1200 ml fluid restriction, patient is consuming approximately 75-100% of meals at this time. Labs and medications reviewed. Pt to discharge home today.  Addalee Kavanagh A. Mayford Knife, RD, LDN, CDE Pager: [phone] After hours Pager: [phone]

## 2017-11-22 NOTE — Progress Notes (Signed)
Noted consult for DBIV. Pt has DL HDC in place with no other central line access. Spoke to primary nurse regarding consult. Instructed that [facility] must have a 3rd purple lumen for lab draws and order to draw labs from renal physician must be present. Nurse verbalized understanding and stated she would call phlebotomy for lab draw.

## 2017-11-22 NOTE — Care Management Note (Signed)
Case Management Note  Patient Details  Name: Eric Murillo MRN: [id number] Date of Birth: [birth year]  Subjective/Objective: Pt presented for Acute on Chronic CHF-New HD set up @ [facility] [address] TTS schedule. First treatment will be Saturday. Patient is aware. Pt states he lives in [place] with girlfriend and will have transportation to HD.                   Action/Plan: Pt was previously on Eliquis-30 day free card provided (pt may have used one previously). He has Medicaid and cost is no more than $3.00. Previous CM had set up for Outpatient Therapy. No further needs from CM at this time.   Expected Discharge Date:  11/22/17               Expected Discharge Plan:  Home/Self Care  In-House Referral:  NA  Discharge planning Services  CM Consult, Other - See comment(PCP provider. out patient PT)  Post Acute Care Choice:  NA Choice offered to:  NA  DME Arranged:  N/A DME Agency:  NA  HH Arranged:  NA HH Agency:  NA  Status of Service:  Completed, signed off  If discussed at Long Length of Stay Meetings, dates discussed:    Additional Comments:  Gala Lewandowsky, RN 11/22/2017, 12:48 PM

## 2017-11-22 NOTE — Discharge Summary (Addendum)
Advanced Heart Failure Discharge Note  Discharge Summary   Patient ID: Eric Murillo MRN: [id number], DOB/AGE: 04-04-1961 57 y.o. Admit date: 11/03/2017 D/C date:     11/22/2017   Primary Discharge Diagnoses:  1. Cardiac arrest - VF arrest in setting of hyperkalemia 2. A/C systolic HF 3. Atypical atrial flutter - S/P DCCV 04/03/14, 02/2016, 05/2017, and 11/21/17 - On apixiban, amiodarone 200 mg daily 4. 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 AVF placed 11/12/17 5. OSA 6. Morbid Obesity 7. Hyperkalemia 8. Acute hypoxic respiratory failure 9. Thrombocytopenia 10. ID 11. Deconditioning - Discharged with HHPT, 3 in 1 commode, and shower seat  Hospital Course: [name] is a 57 y.o. male with a history of morbid obesity, hyperlipidemia, HTN, atrial flutter (s/p TEE DC-CV 04/03/14, 8/17, and 11/18), NICM with chronic systolic HF, and severe OSAbut unable to tolerate CPAP. He was admitted 11/03/17 with increased dyspnea, weight gain, and AKI with creatinine 4.4. He was initially started on a lasix drip with little response and ultimately required initiation of HD on 4/19. Course was complicated by VF arrest in the setting of hyperkalemia. Problem-based review of admission below.     1. Cardiac arrest: VF arrest on 4/22 in the setting of hyperkalemia. He required several shocks, intubation, and emergent CRRT. He was extubated on 4/24 and transitioned back to iHD on 4/27. 2. Acute on chronic systolic CHF: Nonischemic cardiomyopathy x years. 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  Last echo in 11/18 with moderate LV dilation, EF 20-25%, mildly decreased RV systolic function. He had a workup for cardiac amyloidosis in the past. However, negative SPEP and abdominal fat pad biopsy negative. Low voltage on ECG may be due to obesity and not amyloidosis.This admission, he has progressed to ESRD, now volume managed by HD. Echo 11/13/17: EF 20-25%, mild to mod MR. Diuresed 60 lbs. DC weight 266 lbs. -  Initially started on lasix drip with metolazone with sluggish UOP. Started on HD on 4/19.  - Continue HD for volume removal.  - Continue low dose Coreg 3.125 mg bid on non-HD days.  - Bidil was stopped with soft blood pressures. HF meds limited with ESRD.  3. Atypical atrial flutter: Recurrent. S/p several DCCV, most recently 05/2017. Saw Dr Elberta Fortis, not thought to be a good ablation candidate.  - Continue amiodarone 200 mg daily - Platelets dropped as low as 49 with heparin, so he was switched to apixaban. HIT panel sent => negative. - Platelets improved to 90 on a day of discharge. No s/s bleeding. - Underwent successful TEE/DCCV 11/21/17, maintained NSR afterward 4. 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 Tunneled HD cath and AVF placed 4/22. Tolerating iHD well. Blood pressured 90-100s. He has not required midodrine. - HD per renal. He is scheduled for first treatment outpatient on Saturday.  5. OSA: Has been unable to tolerate CPAP.  6. Morbid obesity -Body mass index is 38.88 kg/m.  7. Hyperkalemia: Resolved with CRRT.  8. Acute hypoxemic respiratory failure: Intubated during VF arrest on 11/12/17 and extubated 11/14/17 with no complications. Remained on RA. 9. Thrombocytopenia: Platelets dropped as low as 49 with heparin drip, so he was switched to apixaban. HIT panel sent => came back negative. Platelets improved to 90 on a day of discharge. No s/s bleeding. 10. ID: Completed course of zosyn for possible aspiration during cardiac arrest. 11. Deconditioning Improved by day of discharge. Lives with his girlfriend. Order placed for [facility] PT and RN +  shower seat + 3 in 1 commode.  He will follow up closely in HF clinic as below. Outpatient HD has been arranged. HH PT, RN, shower seat, and 3 in 1 commode orders have been placed. He will follow up with cardiac rehab.   Discharge Weight Range: 266 lbs Discharge Vitals: Blood pressure 98/69, pulse 93, temperature 98.3 F (36.8 C),  temperature source Oral, resp. rate 18, height 5' 9.5" (1.765 m), weight 266 lb 11.2 oz (121 kg), SpO2 94 %.  Labs: Lab Results  Component Value Date   WBC 13.3 (H) 11/22/2017   HGB 12.7 (L) 11/22/2017   HCT 40.1 11/22/2017   MCV 83.9 11/22/2017   PLT 90 (L) 11/22/2017    Recent Labs  Lab 11/19/17 0621  11/22/17 1222  NA 131*   < > 135  K 4.8   < > 4.8  CL 93*   < > 97*  CO2 22   < > 22  BUN 70*   < > 53*  CREATININE 9.90*   < > 9.13*  CALCIUM 9.2   < > 9.5  PROT 8.0  --   --   BILITOT 1.4*  --   --   ALKPHOS 170*  --   --   ALT 64*  --   --   AST 43*  --   --   GLUCOSE 90   < > 100*   < > = values in this interval not displayed.   Lab Results  Component Value Date   CHOL 145 07/10/2013   HDL 31 (L) 07/10/2013   LDLCALC 84 07/10/2013   TRIG 86 11/13/2017   BNP (last 3 results) Recent Labs    07/26/17 0916 11/03/17 1238 11/13/17 0400  BNP 399.7* 1,160.1* 1,085.3*    ProBNP (last 3 results) No results for input(s): PROBNP in the last 8760 hours.   Diagnostic Studies/Procedures   Echo 11/13/17 - Left ventricle: The cavity size was mildly dilated. Wall   thickness was normal. Systolic function was severely reduced. The   estimated ejection fraction was in the range of 20% to 25%.   Diffuse hypokinesis. Doppler parameters are consistent with   restrictive physiology, indicative of decreased left ventricular   diastolic compliance and/or increased left atrial pressure. - Mitral valve: Calcified annulus. Mildly thickened leaflets .   There was mild to moderate regurgitation. - Left atrium: The atrium was mildly dilated. - Right atrium: The atrium was mildly dilated. - Pulmonary arteries: Systolic pressure was moderately increased.  Discharge Medications   Allergies as of 11/22/2017      Reactions   Nsaids Other (See Comments)   Cannot take to due kidney issues   Beta Adrenergic Blockers Other (See Comments)   "An unnamed, white-colored Beta Blocker made"  him "feel funny" = made him feel cagey      Medication List    STOP taking these medications   isosorbide-hydrALAZINE 20-37.5 MG tablet Commonly known as:  BIDIL   metolazone 2.5 MG tablet Commonly known as:  ZAROXOLYN   potassium chloride SA 20 MEQ tablet Commonly known as:  KLOR-CON M20   torsemide 20 MG tablet Commonly known as:  DEMADEX     TAKE these medications   acetaminophen 500 MG tablet Commonly known as:  TYLENOL Take 2 tablets (1,000 mg total) by mouth every 8 (eight) hours as needed. What changed:  reasons to take this   allopurinol 100 MG tablet Commonly known as:  ZYLOPRIM TAKE 1 TABLET BY  MOUTH TWO TIMES DAILY   amiodarone 200 MG tablet Commonly known as:  PACERONE Take 1 tablet (200 mg total) by mouth daily. Start taking on:  11/23/2017   calcitRIOL 0.5 MCG capsule Commonly known as:  ROCALTROL Take 1 capsule (0.5 mcg total) by mouth every other day. Start taking on:  11/24/2017   calcium acetate 667 MG capsule Commonly known as:  PHOSLO Take 2 capsules (1,334 mg total) by mouth 3 (three) times daily with meals.   carvedilol 3.125 MG tablet Commonly known as:  COREG Take 1 tablet (3.125 mg total) by mouth 2 (two) times daily with a meal. Give ONLY on non-HD days. Hold on dialysis days. What changed:    medication strength  how much to take  additional instructions   colchicine 0.6 MG tablet Take 0.6 mg 2 (two) times daily as needed by mouth (FOR GOUT FLARES). Reported on 01/18/2016   diclofenac sodium 1 % Gel Commonly known as:  VOLTAREN Apply 4 g topically 4 (four) times daily.   ELIQUIS 5 MG Tabs tablet Generic drug:  apixaban TAKE ONE TABLET BY MOUTH TWICE DAILY   multivitamin Tabs tablet Take 1 tablet by mouth at bedtime.            Durable Medical Equipment  (From admission, onward)        Start     Ordered   11/22/17 0000  Heart failure home health orders  (Heart failure home health orders / Face to face)    Comments:   Heart Failure Follow-up Care:  Verify follow-up appointments per Patient Discharge Instructions. Confirm transportation arranged. Reconcile home medications with discharge medication list. Remove discontinued medications from use. Assist patient/caregiver to manage medications using pill box. Reinforce low sodium food selection Assessments: Vital signs and oxygen saturation at each visit. Assess home environment for safety concerns, caregiver support and availability of low-sodium foods. Consult Child psychotherapist, PT/OT, Dietitian, and CNA based on assessments. Perform comprehensive cardiopulmonary assessment. Notify MD for any change in condition or weight gain of 3 pounds in one day or 5 pounds in one week with symptoms. Daily Weights and Symptom Monitoring: Ensure patient has access to scales. Teach patient/caregiver to weigh daily before breakfast and after voiding using same scale and record.    Teach patient/caregiver to track weight and symptoms and when to notify Provider. Activity: Develop individualized activity plan with patient/caregiver.   Question Answer Comment  Heart Failure Follow-up Care [facility] at [phone]   Obtain the following labs Other see comments   Lab frequency Other see comments   Fax lab results to [facility] at [phone]   Diet Low Sodium Heart Healthy   Fluid restrictions: 2000 mL Fluid   Skilled Nurse to notify MD of weight trends weekly for first 2 weeks. May fax or call: [facility] at [phone] (fax) or [phone]   Initiate Heart Failure Clinic Diuretic Protocol to be used by [facility] only ( to be ordered by Heart Failure Team Providers Only) No      11/22/17 1151   11/22/17 0000  For home use only DME 3 n 1     11/22/17 1151   11/22/17 0000  For home use only DME Shower stool     11/22/17 1151      Disposition   The patient will be discharged in stable condition to home. Discharge Instructions     (HEART FAILURE PATIENTS) Call MD:  Anytime you have any of  the following symptoms: 1) 3 pound weight gain in 24 hours or 5 pounds in 1 week 2) shortness of breath, with or without a dry hacking cough 3) swelling in the hands, feet or stomach 4) if you have to sleep on extra pillows at night in order to breathe.   Complete by:  As directed    Diet - low sodium heart healthy   Complete by:  As directed    Face-to-face encounter (required for Medicare/Medicaid patients)   Complete by:  As directed    I Alford Highland certify that this patient is under my care and that I, or a nurse practitioner or physician's assistant working with me, had a face-to-face encounter that meets the physician face-to-face encounter requirements with this patient on 11/22/2017. The encounter with the patient was in whole, or in part for the following medical condition(s) which is the primary reason for home health care (List medical condition): CHF   The encounter with the patient was in whole, or in part, for the following medical condition, which is the primary reason for home health care:  CHF   I certify that, based on my findings, the following services are medically necessary home health services:   Nursing Physical therapy     Reason for Medically Necessary Home Health Services:  Skilled Nursing- Teaching of Disease Process/Symptom Management   My clinical findings support the need for the above services:  Shortness of breath with activity   Further, I certify that my clinical findings support that this patient is homebound due to:  Shortness of Breath with activity   For home use only DME 3 n 1   Complete by:  As directed    For home use only DME Shower stool   Complete by:  As directed    Heart failure home health orders   Complete by:  As directed    Heart Failure Follow-up Care:  Verify follow-up appointments per Patient Discharge Instructions. Confirm transportation arranged. Reconcile home medications with  discharge medication list. Remove discontinued medications from use. Assist patient/caregiver to manage medications using pill box. Reinforce low sodium food selection Assessments: Vital signs and oxygen saturation at each visit. Assess home environment for safety concerns, caregiver support and availability of low-sodium foods. Consult Child psychotherapist, PT/OT, Dietitian, and CNA based on assessments. Perform comprehensive cardiopulmonary assessment. Notify MD for any change in condition or weight gain of 3 pounds in one day or 5 pounds in one week with symptoms. Daily Weights and Symptom Monitoring: Ensure patient has access to scales. Teach patient/caregiver to weigh daily before breakfast and after voiding using same scale and record.    Teach patient/caregiver to track weight and symptoms and when to notify Provider. Activity: Develop individualized activity plan with patient/caregiver.    Heart Failure Follow-up Care:  [facility] at [phone]   Obtain the following labs:  Other see comments   Lab frequency:  Other see comments   Fax lab results to:  [facility] at [phone]   Diet:  Low Sodium Heart Healthy   Fluid restrictions:  2000 mL Fluid   Skilled Nurse to notify MD of weight trends weekly for first 2 weeks. May fax or call:  [facility] at [phone] (fax) or [phone]   Initiate Heart Failure Clinic Diuretic Protocol to be used by [facility] only ( to be ordered by Heart Failure Team Providers Only):  No   Increase activity slowly   Complete  by:  As directed      Follow-up Information    [facility] Follow up.   Specialty:  Rehabilitation Why:  They will call you to set up physical therapy appointment Contact information: [address] [id number] mc [place] [address] [phone]       [facility] Follow up.   Why:  You can call on  Monday to make appointment to establish primary care provider Contact information: [address] [place] [address] [phone]       [facility] Follow up.   Why:  You can call on Monday to make appointment to establish primary care provider Contact information: [address] [place] [address] [phone]        Sherren Kerns, MD In 6 weeks.   Specialties:  Vascular Surgery, Cardiology Why:  Office will call you to arrange your appt (sent) Contact information: [address] [place] [address] [phone]        [facility]. Go on 12/03/2017.   Specialty:  Cardiology Why:  9:00 AM, [facility], parking code 1200 Contact information: [address] [id number] mc [place] [address] [phone]            Duration of Discharge Encounter: Greater than 35 minutes   Signed, Alford Highland, NP 11/22/2017, 1:57 PM

## 2017-11-23 ENCOUNTER — Telehealth (HOSPITAL_COMMUNITY): Payer: Self-pay | Admitting: *Deleted

## 2017-11-23 MED ORDER — CALCITRIOL 0.5 MCG PO CAPS: 0.5000 ug | ORAL_CAPSULE | ORAL | 0 refills | Status: AC

## 2017-11-23 NOTE — Telephone Encounter (Signed)
Pt called to verify his medications as there were many changes in the hospital.  Spent 20 min on the phone w/pt going over his medications, he does not have to Calcitrol so a new prescription was sent in for it, pt will p/u later.

## 2017-11-26 ENCOUNTER — Telehealth (HOSPITAL_COMMUNITY): Payer: Self-pay

## 2017-11-26 ENCOUNTER — Ambulatory Visit: Payer: Medicaid Other

## 2017-11-26 NOTE — Telephone Encounter (Signed)
Attempted to call patient to see if he is interested in the Cardiac Rehab Program - SO of patient answered and stated he is at work and will call back later. Will follow up with patient after follow up appt has been completed if no phone call from patient.

## 2017-11-26 NOTE — Telephone Encounter (Signed)
Patients insurance is active and benefits verified through [facility] - Passport/reference [id number]  Will fax over [facility] Reimbursement Form for Dr.McLean to complete.  Will contact patient to see if he is interested in the Cardiac Rehab Program. If interested, patient will need to complete follow up appt. Once completed, patient will be contacted for scheduling upon review by the RN Navigator.

## 2017-11-27 ENCOUNTER — Encounter (HOSPITAL_COMMUNITY): Payer: Medicaid Other

## 2017-11-28 ENCOUNTER — Other Ambulatory Visit: Payer: Self-pay

## 2017-11-28 ENCOUNTER — Ambulatory Visit (INDEPENDENT_AMBULATORY_CARE_PROVIDER_SITE_OTHER): Payer: Medicaid Other | Admitting: Internal Medicine

## 2017-11-28 VITALS — BP 101/64 | HR 99 | Temp 98.3°F | Wt 270.6 lb

## 2017-11-28 DIAGNOSIS — I132 Hypertensive heart and chronic kidney disease with heart failure and with stage 5 chronic kidney disease, or end stage renal disease: Secondary | ICD-10-CM | POA: Diagnosis not present

## 2017-11-28 DIAGNOSIS — N186 End stage renal disease: Secondary | ICD-10-CM

## 2017-11-28 DIAGNOSIS — I5023 Acute on chronic systolic (congestive) heart failure: Secondary | ICD-10-CM

## 2017-11-28 DIAGNOSIS — Z992 Dependence on renal dialysis: Secondary | ICD-10-CM

## 2017-11-28 DIAGNOSIS — Z7901 Long term (current) use of anticoagulants: Secondary | ICD-10-CM | POA: Diagnosis not present

## 2017-11-28 DIAGNOSIS — Z79899 Other long term (current) drug therapy: Secondary | ICD-10-CM

## 2017-11-28 DIAGNOSIS — R011 Cardiac murmur, unspecified: Secondary | ICD-10-CM | POA: Diagnosis not present

## 2017-11-28 NOTE — Patient Instructions (Addendum)
FOLLOW-UP INSTRUCTIONS When: 12/12/2017 with Dr. Allena Katz For: Routine visit What to bring: All of your medications   I have ordered a lab for today and will call you with any concerning abnormalities.  Eliquis; take one tablet in the morning and one tablet in the evening.   Allopurinol: take one tablet in the morning and one tablet in the evening.   Amiodarone: take one tablet in the morning.  Carvedilol: take one tablet on Monday, Wednesday, Friday and Sunday  Calcitriol: take one tablet every other day, may take   Calcium acetate: take two tablets three times per day.   Thank you for your visit to the [facility] today.

## 2017-11-28 NOTE — Progress Notes (Signed)
   CC: Recent hospital admission and discharge  HPI:[name] is a 57 y.o. male who was recently admitted for increased dyspnea, weight gain and an AKI undergoing HD. He went into VF arrest in the setting of hyperkalemia and was coded multiple times. He is now on HD for renal failure.  ESRD: He denies increased shortness of breath, dyspnea, worsening of his lower extremity edema, cough, continues to endorse easy fatigability.  He presents today for questions regarding his medications and medication regimen.  We spent some time explaining the recommended medication regimen provided with a pillbox to further system with the findings of medications that appropriately to ease the strain of the complicated regimen. The regimen as described as as follows Eliquis; take one tablet in the morning and one tablet in the evening.  Allopurinol: take one tablet in the morning and one tablet in the evening.  Amiodarone: take one tablet in the morning. Carvedilol: take one tablet on Monday, Wednesday, Friday and Sunday Calcitriol: take one tablet every other day, may take  Calcium acetate: take two tablets three times per day.  No change in therapy was made during this visit. A renal function panel was obtained as per patient's request deemed appropriate which revealed a stable creatinine of 9.3 done expected in ESRD.  BUN of 47 also expected, with a phosphorus of 4.2, albumin 4.3, potassium 4.5, and sodium of 138.  No further adjustments are indicated at this time.  Acute on chronic HFrEF: Patient LVEF 20 to 25% echocardiogram 11/13/2017 with mild to moderate MR.  He diuresed approximately 60 pounds during admission. Patient weight approximately 4 pounds from discharge.  He continues to follow-up for dialysis needs but is no longer on diuretic given status renal function.    Spoke with patient and explained his lab results via phone on 11/29/2017 at 8:41.  Past Medical History:  Diagnosis Date  .  AICD (automatic cardioverter/defibrillator) present   . Anemia of chronic disease   . Arthritis   . Atrial flutter (HCC)    DCCV 9/15  . Chest pain on exertion   . Chronic systolic congestive heart failure, NYHA class 2 (HCC)    EF 20-25% 1/16  . CKD (chronic kidney disease) stage 3, GFR 30-59 ml/min (HCC)   . Dyslipidemia   . Gout    Of big toe  . Hyperglycemia   . Hyperlipidemia   . Hypertension   . Morbid obesity with BMI of 45.0-49.9, adult (HCC)   . Non-ischemic cardiomyopathy (HCC)   . Organic erectile dysfunction   . OSA (obstructive sleep apnea)   . Pilonidal cyst   . Submandibular sialolithiasis    Right   Review of Systems: ROS was negative except as per HPI and individual problem based assessment and plans.  Physical Exam:  Vitals:   11/28/17 1049  BP: 101/64  Pulse: 99  Temp: 98.3 F (36.8 C)  TempSrc: Oral  SpO2: 98%  Weight: 270 lb 9.6 oz (122.7 kg)   Physical Exam  Constitutional: He appears well-developed and well-nourished. No distress.  Cardiovascular: Normal rate and regular rhythm.  Murmur heard. Pulmonary/Chest: Effort normal and breath sounds normal. No stridor. No respiratory distress.  Abdominal: Soft. Bowel sounds are normal. He exhibits no distension.  Musculoskeletal: He exhibits edema. He exhibits no tenderness.   Assessment & Plan:   See Encounters Tab for problem based charting.  Patient discussed with Dr. Cleda Daub

## 2017-11-29 LAB — RENAL FUNCTION PANEL
Albumin: 4.3 g/dL (ref 3.5–5.5)
BUN/Creatinine Ratio: 5 — ABNORMAL LOW (ref 9–20)
BUN: 47 mg/dL — AB (ref 6–24)
CALCIUM: 9.2 mg/dL (ref 8.7–10.2)
CHLORIDE: 94 mmol/L — AB (ref 96–106)
CO2: 22 mmol/L (ref 20–29)
Creatinine, Ser: 9.39 mg/dL — ABNORMAL HIGH (ref 0.76–1.27)
GFR calc Af Amer: 6 mL/min/{1.73_m2} — ABNORMAL LOW (ref >59)
GFR calc non Af Amer: 6 mL/min/{1.73_m2} — ABNORMAL LOW (ref >59)
Glucose: 101 mg/dL — ABNORMAL HIGH (ref 65–99)
PHOSPHORUS: 4.2 mg/dL (ref 2.5–4.5)
Potassium: 4.5 mmol/L (ref 3.5–5.2)
SODIUM: 138 mmol/L (ref 134–144)

## 2017-11-30 NOTE — Assessment & Plan Note (Signed)
Acute on chronic HFrEF: Patient LVEF 20 to 25% echocardiogram 11/13/2017 with mild to moderate MR.  He diuresed approximately 60 pounds during admission. Patient weight approximately 4 pounds from discharge.  He continues to follow-up for dialysis needs but is no longer on diuretic given status renal function.

## 2017-11-30 NOTE — Assessment & Plan Note (Signed)
ESRD: He denies increased shortness of breath, dyspnea, worsening of his lower extremity edema, cough, continues to endorse easy fatigability.  He presents today for questions regarding his medications and medication regimen.  We spent some time explaining the recommended medication regimen provided with a pillbox to further system with the findings of medications that appropriately to ease the strain of the complicated regimen. The regimen as described as as follows Eliquis; take one tablet in the morning and one tablet in the evening.  Allopurinol: take one tablet in the morning and one tablet in the evening.  Amiodarone: take one tablet in the morning. Carvedilol: take one tablet on Monday, Wednesday, Friday and Sunday Calcitriol: take one tablet every other day, may take  Calcium acetate: take two tablets three times per day.  No change in therapy was made during this visit. A renal function panel was obtained as per patient's request deemed appropriate which revealed a stable creatinine of 9.3 done expected in ESRD.  BUN of 47 also expected, with a phosphorus of 4.2, albumin 4.3, potassium 4.5, and sodium of 138.  No further adjustments are indicated at this time.

## 2017-12-03 ENCOUNTER — Encounter (HOSPITAL_COMMUNITY): Payer: Self-pay

## 2017-12-03 ENCOUNTER — Encounter: Payer: Self-pay | Admitting: Cardiology

## 2017-12-03 ENCOUNTER — Other Ambulatory Visit: Payer: Self-pay

## 2017-12-03 ENCOUNTER — Ambulatory Visit (HOSPITAL_COMMUNITY)
Admit: 2017-12-03 | Discharge: 2017-12-03 | Disposition: A | Discharge status: Home / Self Care | Payer: Medicaid Other | Source: Ambulatory Visit | Attending: Cardiology | Admitting: Cardiology

## 2017-12-03 VITALS — BP 112/76 | HR 87 | Wt 270.8 lb

## 2017-12-03 DIAGNOSIS — Z6841 Body Mass Index (BMI) 40.0 and over, adult: Secondary | ICD-10-CM

## 2017-12-03 DIAGNOSIS — G4733 Obstructive sleep apnea (adult) (pediatric): Secondary | ICD-10-CM | POA: Diagnosis not present

## 2017-12-03 DIAGNOSIS — Z7901 Long term (current) use of anticoagulants: Secondary | ICD-10-CM | POA: Diagnosis not present

## 2017-12-03 DIAGNOSIS — D696 Thrombocytopenia, unspecified: Secondary | ICD-10-CM | POA: Insufficient documentation

## 2017-12-03 DIAGNOSIS — E785 Hyperlipidemia, unspecified: Secondary | ICD-10-CM | POA: Diagnosis not present

## 2017-12-03 DIAGNOSIS — Z79899 Other long term (current) drug therapy: Secondary | ICD-10-CM | POA: Diagnosis not present

## 2017-12-03 DIAGNOSIS — M109 Gout, unspecified: Secondary | ICD-10-CM | POA: Diagnosis not present

## 2017-12-03 DIAGNOSIS — Z8249 Family history of ischemic heart disease and other diseases of the circulatory system: Secondary | ICD-10-CM | POA: Insufficient documentation

## 2017-12-03 DIAGNOSIS — Z6839 Body mass index (BMI) 39.0-39.9, adult: Secondary | ICD-10-CM | POA: Diagnosis not present

## 2017-12-03 DIAGNOSIS — I5022 Chronic systolic (congestive) heart failure: Secondary | ICD-10-CM | POA: Diagnosis not present

## 2017-12-03 DIAGNOSIS — N186 End stage renal disease: Secondary | ICD-10-CM | POA: Diagnosis not present

## 2017-12-03 DIAGNOSIS — I132 Hypertensive heart and chronic kidney disease with heart failure and with stage 5 chronic kidney disease, or end stage renal disease: Secondary | ICD-10-CM | POA: Insufficient documentation

## 2017-12-03 DIAGNOSIS — Z992 Dependence on renal dialysis: Secondary | ICD-10-CM | POA: Diagnosis not present

## 2017-12-03 DIAGNOSIS — D691 Qualitative platelet defects: Secondary | ICD-10-CM

## 2017-12-03 DIAGNOSIS — I428 Other cardiomyopathies: Secondary | ICD-10-CM | POA: Insufficient documentation

## 2017-12-03 DIAGNOSIS — Z8674 Personal history of sudden cardiac arrest: Secondary | ICD-10-CM | POA: Insufficient documentation

## 2017-12-03 DIAGNOSIS — I484 Atypical atrial flutter: Secondary | ICD-10-CM | POA: Diagnosis not present

## 2017-12-03 LAB — COMPREHENSIVE METABOLIC PANEL
ALBUMIN: 3.6 g/dL (ref 3.5–5.0)
ALT: 49 U/L (ref 17–63)
ANION GAP: 15 (ref 5–15)
AST: 47 U/L — ABNORMAL HIGH (ref 15–41)
Alkaline Phosphatase: 165 U/L — ABNORMAL HIGH (ref 38–126)
BUN: 48 mg/dL — ABNORMAL HIGH (ref 6–20)
CO2: 27 mmol/L (ref 22–32)
Calcium: 9.7 mg/dL (ref 8.9–10.3)
Chloride: 93 mmol/L — ABNORMAL LOW (ref 101–111)
Creatinine, Ser: 8.06 mg/dL — ABNORMAL HIGH (ref 0.61–1.24)
GFR calc non Af Amer: 7 mL/min — ABNORMAL LOW (ref >60)
GFR, EST AFRICAN AMERICAN: 8 mL/min — AB (ref >60)
GLUCOSE: 98 mg/dL (ref 65–99)
POTASSIUM: 3.9 mmol/L (ref 3.5–5.1)
SODIUM: 135 mmol/L (ref 135–145)
TOTAL PROTEIN: 7.6 g/dL (ref 6.5–8.1)
Total Bilirubin: 1 mg/dL (ref 0.3–1.2)

## 2017-12-03 LAB — CBC
HCT: 40.1 % (ref 39.0–52.0)
Hemoglobin: 12.5 g/dL — ABNORMAL LOW (ref 13.0–17.0)
MCH: 27.2 pg (ref 26.0–34.0)
MCHC: 31.2 g/dL (ref 30.0–36.0)
MCV: 87.2 fL (ref 78.0–100.0)
Platelets: 131 10*3/uL — ABNORMAL LOW (ref 150–400)
RBC: 4.6 MIL/uL (ref 4.22–5.81)
RDW: 18.2 % — AB (ref 11.5–15.5)
WBC: 11.5 10*3/uL — ABNORMAL HIGH (ref 4.0–10.5)

## 2017-12-03 LAB — T4, FREE: Free T4: 1.45 ng/dL (ref 0.82–1.77)

## 2017-12-03 LAB — TSH: TSH: 0.875 u[IU]/mL (ref 0.350–4.500)

## 2017-12-03 NOTE — Progress Notes (Signed)
Patient ID: Eric Murillo, male   DOB: [birth year], 57 y.o.   MRN: [id number]    Advanced Heart Failure Clinic Note  PCP: Dr. Allena Katz Cardiology: Dr Shirlee Latch Nephrology: Dr Darrick Penna  Eric Murillo is a 57 y.o. male with history of morbid obesity, hyperlipidemia, HTN, atrial flutter (s/p TEE DC-CV 04/03/14 and again in 8/17), NICM with chronic systolic HF and severe OSA but unable to tolerate CPAP.    He was admitted in 04/2014 with increased exertional dyspnea and chest tightness.  RHC/LHC showed elevated filling pressures, preserved cardiac output, and no significant coronary disease.  He was diuresed and discharged home. TEE (9/15) with EF 25-30% with global hypokinesis, mild to moderate MR, mildly decreased RV systolic function.   He underwent work-up for cardiac amyloid. SPEP negative. Fat pad biopsy 6/16 with benign adipose tissue. Unable to get cMRI due to size.  Echo in 8/17 showed EF 20-25%, diffuse hypokinesis, PASP 48 mmHg.  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    He was admitted in 8/17 with acute on chronic systolic CHF in setting of atypical atrial flutter.  He was diuresed and had TEE-guided DCCV to NSR.  Amiodarone was begun.   Admitted 11/13 -> 11/19 with atypical Aflutter and underwent TEE/DCCV on 06/06/17. Remains in NSR up to day of discharge. Discharge weight 309 lbs.  Admitted 4/13-11/22/17 with A/C systolic HF. He had poor response to IV diuresis with worsening creatinine. Nephrology was consulted and he was started on HD. He had a VF arrest on 4/22 in the setting of hyperkalemia. Required DC-CV on 11/21/17 for atrial flutter. He also had thrombocytopenia with no source of bleeding and negative HIT panel. DC weight 366 lbs.    He presents today for hospital follow up. Overall doing well. He is going to HD on Tuesday, Thursday, and Saturday. He declined home health and has not yet started cardiac rehab. He is able to walk 1/2 mile on flat ground with no symptoms. Activity is limited by knee  pain. Denies SOB, orthopnea, PND, or edema. No CP or palpitations. Mild dizziness with quick movements. He has cramping during dialysis. Good appetite. No fever or chills. Weights ~265 lbs at home. Compliant with meds. Limiting salt and fluid intake.   Corvue: Baseline impedence much higher since starting dialysis. Thoracic impedence just below threshold. No VF/VT.   Echo 06/05/2017: EF 20-25%, diffuse hypokinesis, moderately dilated LV, mildly decreased RV systolic function, PASP 66 mmHg.  Echo 11/13/17: EF 20-25%, mild to mod MR  Labs (08/21/2014) K 3.9 Creatinine 1.78 Labs (08/04/2015) : K 4.2 Creatinine 1.66 BNP 173 Labs (6/17): K 4.3, creatinine 1.87 Labs (8/17): K 3.9, creatinine 2.27, HCT 41.2 Labs (1/18): K 3.9, creatinine 2.81 Labs (06/27/2017): K 3.3 Creatinine 2.97  Labs (11/28/2017): K 4.5, Creatinine 9.39  Past Medical History: 1. Nonischemic cardiomyopathy: Echo (3/12) with EF 30-35% and mildly dilated LV, diffuse LV hypokinesis, moderate MR, PA systolic pressure 55 mmHg.  Left and right heart cath (3/12): No angiographic CAD; mean RA 20, PA 76/41, mean PCWP 38, CI 2.1.  ANA, SPEP, and HIV negative.  TSH normal.  Denies drug abuse, heavy ETOH intake.  No family history of cardiomyopathy.  Cardiomyopathy may be due to long-standing HTN.  Echo (6/13): EF 35-40%, moderate LV dilation, moderate to severe LAE, PA systolic pressure 52 mmHg.  Unable to fit in magnet for cardiac MRI.  Echo (8/14) with EF 45%, moderately dilated LV, mild LVH, normal RV, PA systolic pressure 49 mmHg. TEE (9/15)  with EF 25-30% with global hypokinesis, mild to moderate MR, mildly decreased RV systolic function. LHC/RHC (10/15) with no significant coronary disease; mean RA 14, PA 65/25 mean 42, unable to obtain PCWP but LVEDP 24, CI 2.69.  Echo (6/16) with EF 30-35%, PA systolic pressure 62 mmHg, RV normal size and systolic function. St Jude ICD.  - Echo (8/17): EF 20-25%, mild LV dilation, diffuse hypokinesis, PASP 48  mmHg.  2. ERECTILE DYSFUNCTION 3. HYPERTENSION  4. CKD: Stage III 5. DYSLIPIDEMIA  6. Obesity 7. Hyperlipidemia 8. Gout 9. OSA: Severe on 6/13 sleep study. Unable to use CPAP.  10. Sialolithiasis 11. Atypical Atrial Flutter: TEE-DC-CV (04/03/14) which was successful; TEE-DCCV in 8/17 was successful.  Now on amiodarone.    Family History: No history of cardiomyopathy No history of cancer among first degree relatives father - died of MI (45s)  mother - HTN  Social History: Lives girlfriend.  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 tobacco - none alcohol - none drugs - none  Review of systems complete and found to be negative unless listed in HPI.    Current Outpatient Medications  Medication Sig Dispense Refill  . acetaminophen (TYLENOL) 500 MG tablet Take 2 tablets (1,000 mg total) by mouth every 8 (eight) hours as needed. (Patient taking differently: Take 1,000 mg every 8 (eight) hours as needed by mouth (for pain or headaches). ) 90 tablet 2  . allopurinol (ZYLOPRIM) 100 MG tablet TAKE 1 TABLET BY MOUTH TWO TIMES DAILY 60 tablet 2  . amiodarone (PACERONE) 200 MG tablet Take 1 tablet (200 mg total) by mouth daily. 30 tablet 6  . calcitRIOL (ROCALTROL) 0.5 MCG capsule Take 1 capsule (0.5 mcg total) by mouth every other day. 15 capsule 0  . calcium acetate (PHOSLO) 667 MG capsule Take 2 capsules (1,334 mg total) by mouth 3 (three) times daily with meals. 180 capsule 0  . carvedilol (COREG) 3.125 MG tablet Take 1 tablet (3.125 mg total) by mouth 2 (two) times daily with a meal. Give ONLY on non-HD days. Hold on dialysis days. 40 tablet 6  . colchicine 0.6 MG tablet Take 0.6 mg 2 (two) times daily as needed by mouth (FOR GOUT FLARES). Reported on 01/18/2016    . diclofenac sodium (VOLTAREN) 1 % GEL Apply 4 g topically 4 (four) times daily. 1 Tube 2  . ELIQUIS 5 MG TABS tablet TAKE ONE TABLET BY MOUTH TWICE DAILY 180 tablet 1  . multivitamin (RENA-VIT) TABS tablet Take 1  tablet by mouth at bedtime. 30 tablet 6   No current facility-administered medications for this encounter.    Vitals:   12/03/17 0919  BP: 112/76  Pulse: 87  SpO2: 99%  Weight: 270 lb 12.8 oz (122.8 kg)   Wt Readings from Last 3 Encounters:  12/03/17 270 lb 12.8 oz (122.8 kg)  11/28/17 270 lb 9.6 oz (122.7 kg)  11/22/17 266 lb 11.2 oz (121 kg)   Physical Exam General: Well appearing. No resp difficulty. HEENT: Normal Neck: Supple. JVP flat. Carotids 2+ bilat; no bruits. No thyromegaly or nodule noted. Cor: PMI nondisplaced. RRR, No M/G/R noted. Right chest HD cath, CDI Lungs: CTAB, normal effort. Abdomen: Soft, non-tender, non-distended, no HSM. No bruits or masses. +BS  Extremities: No cyanosis, clubbing, or rash. R and LLE chronic venous stasis. no edema. RUE AVF.  Neuro: Alert & orientedx3, cranial nerves grossly intact. moves all 4 extremities w/o difficulty. Affect pleasant  EKG: SR with 1st degree AV block with  IVCD, 88 bpm. Personally reviewed.   Assessment/Plan:  1. Chronic systolic heart failure: Nonischemic cardiomyopathy, likely related to HTN.  Echo in 8/14 with EF 45% but EF down to 25-30% on TEE while in atrial flutter (03/2014).  Echo (1/16) with EF ~25% and echo in 6/16 with EF 30-35%.  No cardiac MRI done with elevated creatinine and size. There was concern for cardiac amyloidosis.  However, negative SPEP and abdominal fat pad biopsy negative. Low voltage on ECG may be due to obesity and not amyloidosis. Echo 10/2017: EF 20-25%, mild to mod MR. S/P St Jude ICD - Improved NYHA II symptoms  - Volume now controlled with HD. Stable on exam and Corvue. - Continue carvedilol 3.125 mg BID on non-HD days.  - No spiro/dig/arb with CKD.  - No bidil with soft BPs - Reinforced fluid restriction to < 2 L daily, sodium restriction to less than 2000 mg daily, and the importance of daily weights.  - Encouraged him to attend cardiac rehab.   2. Atypical Atrial Flutter:  Recurrent - s/p several DCCVs, most recently 11/21/17. Has been seen by EP. Not felt to be amenable to ablation.  - EKG today shows NSR - Continue Eliquis 5 mg BID.  - Continue amiodarone 200 mg daily. Will need yearly eye exams. Check LFTs and TFT today  3. Hx of VF arrest - VF arrest on 4/22 in setting of hyperkalemia - Continue amiodarone 200 mg daily - He has been told that he should not drive for 6 months (until 05/14/18)  4. ESRD - Now on HD T/R/Sat via  right chest HD cath - RUE fistula not yet matured - Following with Dr Deterding  5. HTN:   - Soft BPs now that he is on HD  6. OSA: - Has been unable to tolerate CPAP  7. Morbid Obesity:  - Body mass index is 39.42 kg/m. - Needs to continue to try and lose weight.  8. Thrombocytopenia - HIT panel negative while inpatient - Check CBC today.    Labs today Follow up with Dr Shirlee Latch in 2-3 months   Alford Highland, NP  12/03/2017   Greater than 50% of the 25 minute visit was spent in counseling/coordination of care regarding disease state education, salt/fluid restriction, sliding scale diuretics, and medication compliance.

## 2017-12-03 NOTE — Patient Instructions (Signed)
Routine lab work today. Will notify you of abnormal results, otherwise no news is good news!  No changes to medication at this time.  Follow up with Dr. Shirlee Latch in 2-3 months.  Take all medication as prescribed the day of your appointment. Bring all medications with you to your appointment.  Do the following things EVERYDAY: 1) Weigh yourself in the morning before breakfast. Write it down and keep it in a log. 2) Take your medicines as prescribed 3) Eat low salt foods-Limit salt (sodium) to 2000 mg per day.  4) Stay as active as you can everyday 5) Limit all fluids for the day to less than 2 liters

## 2017-12-04 LAB — T3, FREE: T3 FREE: 1.9 pg/mL — AB (ref 2.0–4.4)

## 2017-12-04 NOTE — Progress Notes (Signed)
Internal Medicine Clinic Attending  Case discussed with Dr. Harbrecht at the time of the visit.  We reviewed the resident's history and exam and pertinent patient test results.  I agree with the assessment, diagnosis, and plan of care documented in the resident's note.   

## 2017-12-06 ENCOUNTER — Ambulatory Visit (INDEPENDENT_AMBULATORY_CARE_PROVIDER_SITE_OTHER): Payer: Medicaid Other

## 2017-12-06 ENCOUNTER — Telehealth: Payer: Self-pay

## 2017-12-06 DIAGNOSIS — Z95810 Presence of automatic (implantable) cardiac defibrillator: Secondary | ICD-10-CM | POA: Diagnosis not present

## 2017-12-06 DIAGNOSIS — I5022 Chronic systolic (congestive) heart failure: Secondary | ICD-10-CM

## 2017-12-06 NOTE — Progress Notes (Signed)
EPIC Encounter for ICM Monitoring  Patient Name: Eric Murillo is a 57 y.o. male Date: 12/06/2017 Primary Care Physican: Darreld Mclean, MD Primary Cardiologist:[place] Electrophysiologist: Candie Echevaria Weight:Previous weight 325lbs            Attempted call to patient and unable to reach.   Transmission reviewed.    Thoracic impedance slightly abnormal.  Patient hospitalized in April for CHF exacerbation and has transitioned to hemodialysis.    Recommendations: None.  Patient will be followed for device check every 3 months but not in ICM clinic due to receiving dialysis.   Follow-up plan: No further ICM follow up.   Copy of ICM check sent to Dr. Graciela Husbands.   3 month ICM trend: 12/06/2017    1 Year ICM trend:       Karie Soda, RN 12/06/2017 12:43 PM

## 2017-12-06 NOTE — Telephone Encounter (Signed)
Remote ICM transmission received.  Attempted call to patient and no answer or answering machine.  

## 2017-12-12 ENCOUNTER — Encounter: Payer: Medicaid Other | Admitting: Internal Medicine

## 2017-12-13 ENCOUNTER — Encounter (HOSPITAL_COMMUNITY): Payer: Self-pay | Admitting: Emergency Medicine

## 2017-12-13 ENCOUNTER — Other Ambulatory Visit: Payer: Self-pay

## 2017-12-13 ENCOUNTER — Emergency Department (HOSPITAL_COMMUNITY): Payer: Medicaid Other

## 2017-12-13 ENCOUNTER — Emergency Department (HOSPITAL_COMMUNITY)
Admission: EM | Admit: 2017-12-13 | Discharge: 2017-12-13 | Disposition: A | Discharge status: Home / Self Care | Payer: Medicaid Other | Attending: Emergency Medicine | Admitting: Emergency Medicine

## 2017-12-13 DIAGNOSIS — I5022 Chronic systolic (congestive) heart failure: Secondary | ICD-10-CM | POA: Diagnosis not present

## 2017-12-13 DIAGNOSIS — R1013 Epigastric pain: Secondary | ICD-10-CM | POA: Insufficient documentation

## 2017-12-13 DIAGNOSIS — Z7901 Long term (current) use of anticoagulants: Secondary | ICD-10-CM | POA: Diagnosis not present

## 2017-12-13 DIAGNOSIS — Z992 Dependence on renal dialysis: Secondary | ICD-10-CM | POA: Diagnosis not present

## 2017-12-13 DIAGNOSIS — R0789 Other chest pain: Secondary | ICD-10-CM | POA: Diagnosis present

## 2017-12-13 DIAGNOSIS — Z79899 Other long term (current) drug therapy: Secondary | ICD-10-CM | POA: Diagnosis not present

## 2017-12-13 DIAGNOSIS — I132 Hypertensive heart and chronic kidney disease with heart failure and with stage 5 chronic kidney disease, or end stage renal disease: Secondary | ICD-10-CM | POA: Insufficient documentation

## 2017-12-13 DIAGNOSIS — N186 End stage renal disease: Secondary | ICD-10-CM | POA: Insufficient documentation

## 2017-12-13 LAB — CBC WITH DIFFERENTIAL/PLATELET
Abs Immature Granulocytes: 0 10*3/uL (ref 0.0–0.1)
BASOS ABS: 0.1 10*3/uL (ref 0.0–0.1)
Basophils Relative: 1 %
EOS ABS: 0.3 10*3/uL (ref 0.0–0.7)
EOS PCT: 3 %
HCT: 41.4 % (ref 39.0–52.0)
Hemoglobin: 12.7 g/dL — ABNORMAL LOW (ref 13.0–17.0)
IMMATURE GRANULOCYTES: 0 %
Lymphocytes Relative: 24 %
Lymphs Abs: 2.3 10*3/uL (ref 0.7–4.0)
MCH: 26.5 pg (ref 26.0–34.0)
MCHC: 30.7 g/dL (ref 30.0–36.0)
MCV: 86.4 fL (ref 78.0–100.0)
Monocytes Absolute: 1.4 10*3/uL — ABNORMAL HIGH (ref 0.1–1.0)
Monocytes Relative: 15 %
NEUTROS PCT: 57 %
Neutro Abs: 5.4 10*3/uL (ref 1.7–7.7)
PLATELETS: 185 10*3/uL (ref 150–400)
RBC: 4.79 MIL/uL (ref 4.22–5.81)
RDW: 17.5 % — AB (ref 11.5–15.5)
WBC: 9.4 10*3/uL (ref 4.0–10.5)

## 2017-12-13 LAB — COMPREHENSIVE METABOLIC PANEL
ALT: 41 U/L (ref 17–63)
AST: 46 U/L — AB (ref 15–41)
Albumin: 3.9 g/dL (ref 3.5–5.0)
Alkaline Phosphatase: 122 U/L (ref 38–126)
Anion gap: 15 (ref 5–15)
BUN: 46 mg/dL — AB (ref 6–20)
CALCIUM: 9.1 mg/dL (ref 8.9–10.3)
CHLORIDE: 96 mmol/L — AB (ref 101–111)
CO2: 26 mmol/L (ref 22–32)
CREATININE: 6.86 mg/dL — AB (ref 0.61–1.24)
GFR calc Af Amer: 9 mL/min — ABNORMAL LOW (ref >60)
GFR calc non Af Amer: 8 mL/min — ABNORMAL LOW (ref >60)
Glucose, Bld: 97 mg/dL (ref 65–99)
Potassium: 4 mmol/L (ref 3.5–5.1)
SODIUM: 137 mmol/L (ref 135–145)
TOTAL PROTEIN: 7.7 g/dL (ref 6.5–8.1)
Total Bilirubin: 1.1 mg/dL (ref 0.3–1.2)

## 2017-12-13 LAB — TROPONIN I
TROPONIN I: 0.05 ng/mL — AB (ref <0.03)
TROPONIN I: 0.05 ng/mL — AB (ref <0.03)

## 2017-12-13 NOTE — Discharge Instructions (Addendum)
As discussed, your evaluation today has been largely reassuring.  But, it is important that you monitor your condition carefully, and do not hesitate to return to the ED if you develop new, or concerning changes in your condition.  Otherwise, please follow-up with your physician for appropriate ongoing care.  Please be sure to discuss your congestive heart failure history, with your dialysis team, to ensure appropriate ongoing management for your situation.

## 2017-12-13 NOTE — ED Provider Notes (Signed)
Patient placed in Quick Look pathway, seen and evaluated   Chief Complaint: chest pain  HPI:   Pt reports he hs been having chest pain for several days.  Pt was having dialysis when he began having pain.   ROS: Short of breath  Physical Exam:   Gen: No distress  Neuro: Awake and Alert  Skin: Warm    Focused Exam: Lungs clear, Heart RRR   Initiation of care has begun. The patient has been counseled on the process, plan, and necessity for staying for the completion/evaluation, and the remainder of the medical screening examination An After Visit Summary was printed and given to the patient.    Elson Areas, [place] 12/13/17 1627    Loren Racer, MD 12/14/17 [phone]

## 2017-12-13 NOTE — ED Triage Notes (Signed)
Pt report central CP starting 2 days ago during dialysis. Pt reports he notices it more when he takes a deep breath and bends over, 7/10. Pt denies SHOB, nausea or vomiting. Pt reports some dizziness and pain in his L shoulder.

## 2017-12-13 NOTE — ED Notes (Signed)
Patient verbalizes understanding of discharge instructions. Opportunity for questioning and answers were provided. Armband removed by staff, pt discharged from ED ambualtory.  

## 2017-12-13 NOTE — ED Provider Notes (Signed)
[facility] EMERGENCY DEPARTMENT Provider Note   CSN: [id number] Arrival date & time: 12/13/17  1500     History   Chief Complaint Chief Complaint  Patient presents with  . Chest Pain    HPI Eric Murillo is a 57 y.o. male.  HPI Patient presents with concern of chest pain. Patient points towards his epigastrium, states that it hurt earlier, while he was at dialysis. He is a recent initiate to dialysis, having been performing this for the past month only. He notes that over the past few days in particular he has had intermittent episodes of cramps, sometimes limiting his ability to tolerate a full dialysis session. Today was on those days, in between his diffuse cramps, and his epigastric discomfort, he stopped dialysis after 1 hour. Currently he has no symptoms including no dyspnea, no pain, no lightheadedness, no fever, no cough. Patient started dialysis about 1 month ago after an episode of cardiac arrest, reportedly due to hyperkalemia.  Past Medical History:  Diagnosis Date  . AICD (automatic cardioverter/defibrillator) present   . Anemia of chronic disease   . Arthritis   . Atrial flutter (HCC)    DCCV 9/15  . Chest pain on exertion   . Chronic systolic congestive heart failure, NYHA class 2 (HCC)    EF 20-25% 1/16  . CKD (chronic kidney disease) stage 3, GFR 30-59 ml/min (HCC)   . Dyslipidemia   . Gout    Of big toe  . Hyperglycemia   . Hyperlipidemia   . Hypertension   . Morbid obesity with BMI of 45.0-49.9, adult (HCC)   . Non-ischemic cardiomyopathy (HCC)   . Organic erectile dysfunction   . OSA (obstructive sleep apnea)   . Pilonidal cyst   . Submandibular sialolithiasis    Right    Patient Active Problem List   Diagnosis Date Noted  . Atrial fibrillation (HCC) 11/13/2017  . ESRD (end stage renal disease) on dialysis (HCC) 11/13/2017  . Wide-complex tachycardia (HCC) 11/13/2017  . Acute on chronic combined systolic and diastolic  CHF (congestive heart failure) (HCC) 11/03/2017  . Acute on chronic systolic heart failure (HCC) 03/05/2016  . 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 Long term current use of anticoagulant therapy 04/07/2015  . Health care maintenance 03/10/2015  . Osteoarthritis 06/30/2014  . Anemia of chronic disease 04/21/2014  . Gout 04/21/2014  . Atrial flutter (HCC) 03/24/2014  . Obstructive sleep apnea 12/11/2011  . Cardiomyopathy, nonischemic (HCC) 09/30/2010  . Chronic systolic heart failure (HCC) 09/15/2010  . Cardiac arrest (HCC) 04/19/2009  . Hyperlipidemia 07/04/2006  . Morbid obesity with BMI of 45.0-49.9, adult (HCC) 07/04/2006  . Hypertensive cardiovascular disease 05/30/2006    Past Surgical History:  Procedure Laterality Date  . AV FISTULA PLACEMENT Right 11/12/2017   Procedure: CREATION of RIGHT BRACHIAL CEPHALIC ARTERIOVENOUS (AV) FISTULA;  Surgeon: Sherren Kerns, MD;  Location: [facility] OR;  Service: Vascular;  Laterality: Right;  . CARDIAC CATHETERIZATION  2012  . CARDIOVERSION N/A 04/03/2014   Procedure: CARDIOVERSION;  Surgeon: Laurey Morale, MD;  Location: [facility] ENDOSCOPY;  Service: Cardiovascular;  Laterality: N/A;  . CARDIOVERSION N/A 03/09/2016   Procedure: CARDIOVERSION;  Surgeon: Laurey Morale, MD;  Location: [facility] ENDOSCOPY;  Service: Cardiovascular;  Laterality: N/A;  . CARDIOVERSION N/A 06/06/2017   Procedure: CARDIOVERSION;  Surgeon: Laurey Morale, MD;  Location: [facility] ENDOSCOPY;  Service: Cardiovascular;  Laterality: N/A;  . CARDIOVERSION N/A 11/21/2017   Procedure: CARDIOVERSION;  Surgeon: Laurey Morale, MD;  Location: [facility] ENDOSCOPY;  Service: Cardiovascular;  Laterality: N/A;  . COLONOSCOPY    . EP IMPLANTABLE DEVICE N/A 05/21/2015   Procedure: ICD Implant;  Surgeon: Duke Salvia, MD;  Location: [facility] INVASIVE CV LAB;  Service: Cardiovascular;  Laterality: N/A;  . INSERTION OF DIALYSIS CATHETER Right 11/12/2017   Procedure: PLACEMENT of right internal jugualr tunneled  DIALYSIS CATHETER with removal or temporary dialysis catheter;  Surgeon: Sherren Kerns, MD;  Location: [facility] OR;  Service: Vascular;  Laterality: Right;  . IR FLUORO GUIDE CV LINE RIGHT  11/08/2017  . IR US GUIDE VASC ACCESS RIGHT  11/08/2017  . LEFT AND RIGHT HEART CATHETERIZATION WITH CORONARY ANGIOGRAM N/A 04/24/2014   Procedure: LEFT AND RIGHT HEART CATHETERIZATION WITH CORONARY ANGIOGRAM;  Surgeon: Laurey Morale, MD;  Location: [facility] CATH LAB;  Service: Cardiovascular;  Laterality: N/A;  . SALIVARY GLAND SURGERY  09/12/2012  . SUBMANDIBULAR GLAND EXCISION Right 09/12/2012   Procedure: Removal Right Submandibular Larina Bras;  Surgeon: Serena Colonel, MD;  Location: [facility] OR;  Service: ENT;  Laterality: Right;  . TEE WITHOUT CARDIOVERSION N/A 04/03/2014   Procedure: TRANSESOPHAGEAL ECHOCARDIOGRAM (TEE);  Surgeon: Laurey Morale, MD;  Location: [facility] ENDOSCOPY;  Service: Cardiovascular;  Laterality: N/A;  . TEE WITHOUT CARDIOVERSION N/A 03/09/2016   Procedure: TRANSESOPHAGEAL ECHOCARDIOGRAM (TEE);  Surgeon: Laurey Morale, MD;  Location: [facility] ENDOSCOPY;  Service: Cardiovascular;  Laterality: N/A;  . TEE WITHOUT CARDIOVERSION N/A 06/06/2017   Procedure: TRANSESOPHAGEAL ECHOCARDIOGRAM (TEE);  Surgeon: Laurey Morale, MD;  Location: [facility] ENDOSCOPY;  Service: Cardiovascular;  Laterality: N/A;  . TEE WITHOUT CARDIOVERSION N/A 11/21/2017   Procedure: TRANSESOPHAGEAL ECHOCARDIOGRAM (TEE);  Surgeon: Laurey Morale, MD;  Location: [facility] ENDOSCOPY;  Service: Cardiovascular;  Laterality: N/A;        Home Medications    Prior to Admission medications   Medication Sig Start Date End Date Taking? Authorizing Provider  acetaminophen (TYLENOL) 500 MG tablet Take 2 tablets (1,000 mg total) by mouth every 8 (eight) hours as needed. Patient taking differently: Take 1,000 mg every 8 (eight) hours as needed by mouth (for pain or headaches).  02/07/17 02/07/18 Yes Darreld Mclean, MD  allopurinol (ZYLOPRIM) 100 MG tablet TAKE 1 TABLET  BY MOUTH TWO TIMES DAILY 09/19/17  Yes Darreld Mclean, MD  amiodarone (PACERONE) 200 MG tablet Take 1 tablet (200 mg total) by mouth daily. 11/23/17  Yes Alford Highland, NP  calcitRIOL (ROCALTROL) 0.5 MCG capsule Take 1 capsule (0.5 mcg total) by mouth every other day. Patient taking differently: Take 0.5 mcg by mouth Every Tuesday,Thursday,and Saturday with dialysis.  11/24/17  Yes Alford Highland, NP  calcium acetate (PHOSLO) 667 MG capsule Take 2 capsules (1,334 mg total) by mouth 3 (three) times daily with meals. 11/22/17  Yes Alford Highland, NP  carvedilol (COREG) 3.125 MG tablet Take 1 tablet (3.125 mg total) by mouth 2 (two) times daily with a meal. Give ONLY on non-HD days. Hold on dialysis days. 11/22/17  Yes Alford Highland, NP  colchicine 0.6 MG tablet Take 0.6 mg 2 (two) times daily as needed by mouth (FOR GOUT FLARES). Reported on 01/18/2016   Yes [provider]  diclofenac sodium (VOLTAREN) 1 % GEL Apply 4 g topically 4 (four) times daily. Patient taking differently: Apply 4 g topically 4 (four) times daily as needed (for pain at affected sites).  02/07/17  Yes Darreld Mclean, MD  ELIQUIS 5 MG TABS tablet TAKE ONE TABLET BY MOUTH TWICE DAILY 08/23/17  Yes  Darreld Mclean, MD  multivitamin (RENA-VIT) TABS tablet Take 1 tablet by mouth at bedtime. 11/22/17   Alford Highland, NP    Family History Family History  Problem Relation Age of Onset  . Hypertension Mother   . Hypertension Father   . Cancer Father        brother died of brain cancer; sister died of bone cancer  . Diabetes Sister   . Diabetes Brother   . Colon cancer Maternal Aunt   . Cardiomyopathy Neg Hx     Social History Social History   Tobacco Use  . Smoking status: Never Smoker  . Smokeless tobacco: Never Used  Substance Use Topics  . Alcohol use: No    Alcohol/week: 0.0 oz  . Drug use: No     Allergies   Nsaids and Beta adrenergic blockers   Review of Systems Review of Systems  Constitutional:        Per HPI, otherwise negative  HENT:       Per HPI, otherwise negative  Respiratory:       Per HPI, otherwise negative  Cardiovascular:       Per HPI, otherwise negative  Gastrointestinal: Negative for vomiting.  Endocrine:       Negative aside from HPI  Genitourinary:       Neg aside from HPI   Musculoskeletal:       Per HPI, otherwise negative  Skin: Negative.   Allergic/Immunologic: Positive for immunocompromised state.  Neurological: Negative for syncope.     Physical Exam Updated Vital Signs BP 103/68   Resp 17   Ht 5\' 9"  (1.753 m)   Wt 122 kg (269 lb)   BMI 39.72 kg/m   Physical Exam  Constitutional: He is oriented to person, place, and time. He appears well-developed. No distress.  HENT:  Head: Normocephalic and atraumatic.  Eyes: Conjunctivae and EOM are normal.  Cardiovascular: Normal rate and regular rhythm.  Pulmonary/Chest: Effort normal. No stridor. No respiratory distress.  Abdominal: He exhibits no distension. There is no tenderness.  Musculoskeletal: He exhibits no edema.  Right AC fistula with palpable thrill, superficial surgical scar unremarkable  Neurological: He is alert and oriented to person, place, and time.  Skin: Skin is warm and dry.  Psychiatric: He has a normal mood and affect.  Nursing note and vitals reviewed.    ED Treatments / Results  Labs (all labs ordered are listed, but only abnormal results are displayed) Labs Reviewed  CBC WITH DIFFERENTIAL/PLATELET - Abnormal; Notable for the following components:      Result Value   Hemoglobin 12.7 (*)    RDW 17.5 (*)    Monocytes Absolute 1.4 (*)    All other components within normal limits  COMPREHENSIVE METABOLIC PANEL - Abnormal; Notable for the following components:   Chloride 96 (*)    BUN 46 (*)    Creatinine, Ser 6.86 (*)    AST 46 (*)    GFR calc non Af Amer 8 (*)    GFR calc Af Amer 9 (*)    All other components within normal limits  TROPONIN I - Abnormal; Notable for the  following components:   Troponin I 0.05 (*)    All other components within normal limits  TROPONIN I - Abnormal; Notable for the following components:   Troponin I 0.05 (*)    All other components within normal limits    EKG EKG Interpretation  Date/Time:  Thursday Dec 13 2017 15:08:50 EDT Ventricular Rate:  107 PR Interval:    QRS Duration: 146 QT Interval:  406 QTC Calculation: 542 R Axis:   -93 Text Interpretation:  Wide QRS rhythm Non-specific intra-ventricular conduction block Possible Lateral infarct , age undetermined No significant change since last tracing Abnormal ekg Confirmed by Gerhard Munch [phone]) on 12/13/2017 3:44:33 PM   Radiology Dg Chest 2 View  Result Date: 12/13/2017 CLINICAL DATA:  Chest pain EXAM: CHEST - 2 VIEW COMPARISON:  November 15, 2017 FINDINGS: Central catheter tip is in the superior vena cava near the cavoatrial junction. Pacemaker lead is attached to right ventricle. No pneumothorax. There is no edema or consolidation. Heart is upper normal in size with pulmonary vascularity within normal limits. No adenopathy. No bone lesions. IMPRESSION: No edema or consolidation. Stable cardiac silhouette. No pneumothorax. Electronically Signed   By: Bretta Bang III M.D.   On: 12/13/2017 15:51    Procedures Procedures (including critical care time)  Medications Ordered in ED Medications - No data to display   Initial Impression / Assessment and Plan / ED Course  I have reviewed the triage vital signs and the nursing notes.  Pertinent labs & imaging results that were available during my care of the patient were reviewed by me and considered in my medical decision making (see chart for details).     Since chart review notable for episode of V. fib arrest 1 month ago, just prior to initiation of dialysis.  On prior to that the patient had a long history of congestive heart failure, with no recent catheterization, but with documentation of EF 20/25% with  the fibrillator pacemaker in place.  6:05 PM On repeat exam the patient is awake and alert, in no distress, hemodynamically unremarkable. He has had no recurrence of substantial pain. He has had some discomfort when laughing, substantiating low suspicion for ongoing coronary ischemia. Patient second troponin is positive, though the patient has noted congestive heart failure with ejection fraction 20/25%, and given his status on dialysis, absence of ischemic changes on EKG, absence of concerning story, there is low suspicion for new ischemic lesions.  Patient is also on Eliquis. Given the patient's absence of distress, reassuring vitals, patient will follow up with his cardiologist and primary care team.  I discussed the importance of discussing fluid balancing with his nephrology team, and his next dialysis session in 2 days, as well as return precautions.  Final Clinical Impressions(s) / ED Diagnoses  Atypical chest pain   Gerhard Munch, MD 12/13/17 1807

## 2017-12-19 ENCOUNTER — Encounter (HOSPITAL_COMMUNITY): Payer: Medicaid Other

## 2017-12-20 MED FILL — Medication: Qty: 1 | Status: AC

## 2017-12-25 ENCOUNTER — Other Ambulatory Visit: Payer: Self-pay

## 2017-12-25 ENCOUNTER — Emergency Department (HOSPITAL_COMMUNITY)
Admission: EM | Admit: 2017-12-25 | Discharge: 2017-12-25 | Disposition: A | Discharge status: Home / Self Care | Payer: Medicaid Other | Attending: Emergency Medicine | Admitting: Emergency Medicine

## 2017-12-25 ENCOUNTER — Encounter (HOSPITAL_COMMUNITY): Payer: Self-pay | Admitting: Emergency Medicine

## 2017-12-25 DIAGNOSIS — M10022 Idiopathic gout, left elbow: Secondary | ICD-10-CM | POA: Insufficient documentation

## 2017-12-25 DIAGNOSIS — M109 Gout, unspecified: Secondary | ICD-10-CM

## 2017-12-25 DIAGNOSIS — Z79899 Other long term (current) drug therapy: Secondary | ICD-10-CM | POA: Insufficient documentation

## 2017-12-25 DIAGNOSIS — I130 Hypertensive heart and chronic kidney disease with heart failure and stage 1 through stage 4 chronic kidney disease, or unspecified chronic kidney disease: Secondary | ICD-10-CM | POA: Diagnosis not present

## 2017-12-25 DIAGNOSIS — N183 Chronic kidney disease, stage 3 (moderate): Secondary | ICD-10-CM | POA: Diagnosis not present

## 2017-12-25 DIAGNOSIS — I5022 Chronic systolic (congestive) heart failure: Secondary | ICD-10-CM | POA: Insufficient documentation

## 2017-12-25 DIAGNOSIS — M25522 Pain in left elbow: Secondary | ICD-10-CM | POA: Diagnosis present

## 2017-12-25 LAB — CBC WITH DIFFERENTIAL/PLATELET
Abs Immature Granulocytes: 0.1 10*3/uL (ref 0.0–0.1)
Basophils Absolute: 0.1 10*3/uL (ref 0.0–0.1)
Basophils Relative: 1 %
Eosinophils Absolute: 0.1 10*3/uL (ref 0.0–0.7)
Eosinophils Relative: 1 %
HCT: 39.9 % (ref 39.0–52.0)
Hemoglobin: 12.2 g/dL — ABNORMAL LOW (ref 13.0–17.0)
Immature Granulocytes: 0 %
Lymphocytes Relative: 24 %
Lymphs Abs: 3.7 10*3/uL (ref 0.7–4.0)
MCH: 26.9 pg (ref 26.0–34.0)
MCHC: 30.6 g/dL (ref 30.0–36.0)
MCV: 87.9 fL (ref 78.0–100.0)
Monocytes Absolute: 1.9 10*3/uL — ABNORMAL HIGH (ref 0.1–1.0)
Monocytes Relative: 12 %
Neutro Abs: 9.3 10*3/uL — ABNORMAL HIGH (ref 1.7–7.7)
Neutrophils Relative %: 62 %
Platelets: 278 10*3/uL (ref 150–400)
RBC: 4.54 MIL/uL (ref 4.22–5.81)
RDW: 16.7 % — ABNORMAL HIGH (ref 11.5–15.5)
WBC: 15.1 10*3/uL — ABNORMAL HIGH (ref 4.0–10.5)

## 2017-12-25 LAB — BASIC METABOLIC PANEL
Anion gap: 11 (ref 5–15)
BUN: 50 mg/dL — ABNORMAL HIGH (ref 6–20)
CO2: 27 mmol/L (ref 22–32)
Calcium: 9.2 mg/dL (ref 8.9–10.3)
Chloride: 99 mmol/L — ABNORMAL LOW (ref 101–111)
Creatinine, Ser: 5.42 mg/dL — ABNORMAL HIGH (ref 0.61–1.24)
GFR calc Af Amer: 12 mL/min — ABNORMAL LOW (ref >60)
GFR calc non Af Amer: 11 mL/min — ABNORMAL LOW (ref >60)
Glucose, Bld: 126 mg/dL — ABNORMAL HIGH (ref 65–99)
Potassium: 3.8 mmol/L (ref 3.5–5.1)
Sodium: 137 mmol/L (ref 135–145)

## 2017-12-25 MED ORDER — AMIODARONE HCL 200 MG PO TABS: 200.0000 mg | ORAL_TABLET | Freq: Two times a day (BID) | ORAL | 0 refills | Status: DC

## 2017-12-25 MED ORDER — HYDROCODONE-ACETAMINOPHEN 5-325 MG PO TABS: 1.0000 | ORAL_TABLET | Freq: Four times a day (QID) | ORAL | 0 refills | Status: DC | PRN

## 2017-12-25 MED ORDER — COLCHICINE 0.6 MG PO TABS
0.6000 mg | ORAL_TABLET | Freq: Once | ORAL | Status: AC
  Administered 2017-12-25: 0.6 mg via ORAL
  Filled 2017-12-25: qty 1

## 2017-12-25 NOTE — ED Triage Notes (Signed)
Patient presents to the ED with complaints of left arm pain. Patient denies any trauma reports has history of gout and has had a flare up. Patient reports was on medication before but has ran out of medications. Patient has full ROM in bilateral extremities.

## 2017-12-25 NOTE — ED Notes (Addendum)
While explaining discharge instructions to the patient, this RN asked the patient if he had a ride home. The patient verbalized understanding that he was not allowed to drive for six months and he stated that he had a ride home. The patient was discharged and walked out of the trauma room.

## 2017-12-25 NOTE — ED Notes (Signed)
Pt had episode of shaking prior to this RN giving his medication. Episode lasted 15 seconds, not postictal but pt does not remember event. Notified ED.

## 2017-12-25 NOTE — Discharge Instructions (Addendum)
You were given a single dose of colchicine in the ED which will be removed with your next dialysis session.  Medications: Norco  Treatment: Take 1-2 Norco every 6 hours as needed for severe pain.  Follow-up: Please follow-up with your doctor for further management of your gout.  Please return to emergency department if you develop any new or worsening symptoms.  DO NOT DRIVE FOR 6 MONTHS. Please go to your cardiology appointment as scheduled on 01/01/2018 at 2:30pm.

## 2017-12-25 NOTE — ED Provider Notes (Addendum)
[facility] EMERGENCY DEPARTMENT Provider Note   CSN: [id number] Arrival date & time: 12/25/17  0730     History   Chief Complaint Chief Complaint  Patient presents with  . Arm Pain    HPI Eric Murillo is a 57 y.o. 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  Patient reports he has had gout in his elbow before and it feels very similar.  He reports he was told he should eat more protein after dialysis began and he reports he may have eaten a little too much red meat.  He denies any numbness or tingling.  He is not taking any medications at home for his symptoms.  He denies any fevers.  He reports he used to take colchicine for his gout flares.  HPI  Past Medical History:  Diagnosis Date  . AICD (automatic cardioverter/defibrillator) present   . Anemia of chronic disease   . Arthritis   . Atrial flutter (HCC)    DCCV 9/15  . Chest pain on exertion   . Chronic systolic congestive heart failure, NYHA class 2 (HCC)    EF 20-25% 1/16  . CKD (chronic kidney disease) stage 3, GFR 30-59 ml/min (HCC)   . Dyslipidemia   . Gout    Of big toe  . Hyperglycemia   . Hyperlipidemia   . Hypertension   . Morbid obesity with BMI of 45.0-49.9, adult (HCC)   . Non-ischemic cardiomyopathy (HCC)   . Organic erectile dysfunction   . OSA (obstructive sleep apnea)   . Pilonidal cyst   . Submandibular sialolithiasis    Right    Patient Active Problem List   Diagnosis Date Noted  . Atrial fibrillation (HCC) 11/13/2017  . ESRD (end stage renal disease) on dialysis (HCC) 11/13/2017  . Wide-complex tachycardia (HCC) 11/13/2017  . Acute on chronic combined systolic and diastolic CHF (congestive heart failure) (HCC) 11/03/2017  . Acute on chronic systolic heart failure (HCC) 03/05/2016  . 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 Long term current use of anticoagulant therapy 04/07/2015    . Health care maintenance 03/10/2015  . Osteoarthritis 06/30/2014  . Anemia of chronic disease 04/21/2014  . Gout 04/21/2014  . Atrial flutter (HCC) 03/24/2014  . Obstructive sleep apnea 12/11/2011  . Cardiomyopathy, nonischemic (HCC) 09/30/2010  . Chronic systolic heart failure (HCC) 09/15/2010  . Cardiac arrest (HCC) 04/19/2009  . Hyperlipidemia 07/04/2006  . Morbid obesity with BMI of 45.0-49.9, adult (HCC) 07/04/2006  . Hypertensive cardiovascular disease 05/30/2006    Past Surgical History:  Procedure Laterality Date  . AV FISTULA PLACEMENT Right 11/12/2017   Procedure: CREATION of RIGHT BRACHIAL CEPHALIC ARTERIOVENOUS (AV) FISTULA;  Surgeon: Sherren Kerns, MD;  Location: [facility] OR;  Service: Vascular;  Laterality: Right;  . CARDIAC CATHETERIZATION  2012  . CARDIOVERSION N/A 04/03/2014   Procedure: CARDIOVERSION;  Surgeon: Laurey Morale, MD;  Location: [facility] ENDOSCOPY;  Service: Cardiovascular;  Laterality: N/A;  . CARDIOVERSION N/A 03/09/2016   Procedure: CARDIOVERSION;  Surgeon: Laurey Morale, MD;  Location: [facility] ENDOSCOPY;  Service: Cardiovascular;  Laterality: N/A;  . CARDIOVERSION N/A 06/06/2017   Procedure: CARDIOVERSION;  Surgeon: Laurey Morale, MD;  Location: [facility] ENDOSCOPY;  Service: Cardiovascular;  Laterality: N/A;  . CARDIOVERSION N/A 11/21/2017   Procedure: CARDIOVERSION;  Surgeon: Laurey Morale, MD;  Location: [facility] ENDOSCOPY;  Service: Cardiovascular;  Laterality: N/A;  . COLONOSCOPY    .  EP IMPLANTABLE DEVICE N/A 05/21/2015   Procedure: ICD Implant;  Surgeon: Duke Salvia, MD;  Location: [facility] INVASIVE CV LAB;  Service: Cardiovascular;  Laterality: N/A;  . INSERTION OF DIALYSIS CATHETER Right 11/12/2017   Procedure: PLACEMENT of right internal jugualr tunneled DIALYSIS CATHETER with removal or temporary dialysis catheter;  Surgeon: Sherren Kerns, MD;  Location: [facility] OR;  Service: Vascular;  Laterality: Right;  . IR FLUORO GUIDE CV LINE RIGHT  11/08/2017  . IR US  GUIDE VASC ACCESS RIGHT  11/08/2017  . LEFT AND RIGHT HEART CATHETERIZATION WITH CORONARY ANGIOGRAM N/A 04/24/2014   Procedure: LEFT AND RIGHT HEART CATHETERIZATION WITH CORONARY ANGIOGRAM;  Surgeon: Laurey Morale, MD;  Location: [facility] CATH LAB;  Service: Cardiovascular;  Laterality: N/A;  . SALIVARY GLAND SURGERY  09/12/2012  . SUBMANDIBULAR GLAND EXCISION Right 09/12/2012   Procedure: Removal Right Submandibular Larina Bras;  Surgeon: Serena Colonel, MD;  Location: [facility] OR;  Service: ENT;  Laterality: Right;  . TEE WITHOUT CARDIOVERSION N/A 04/03/2014   Procedure: TRANSESOPHAGEAL ECHOCARDIOGRAM (TEE);  Surgeon: Laurey Morale, MD;  Location: [facility] ENDOSCOPY;  Service: Cardiovascular;  Laterality: N/A;  . TEE WITHOUT CARDIOVERSION N/A 03/09/2016   Procedure: TRANSESOPHAGEAL ECHOCARDIOGRAM (TEE);  Surgeon: Laurey Morale, MD;  Location: [facility] ENDOSCOPY;  Service: Cardiovascular;  Laterality: N/A;  . TEE WITHOUT CARDIOVERSION N/A 06/06/2017   Procedure: TRANSESOPHAGEAL ECHOCARDIOGRAM (TEE);  Surgeon: Laurey Morale, MD;  Location: [facility] ENDOSCOPY;  Service: Cardiovascular;  Laterality: N/A;  . TEE WITHOUT CARDIOVERSION N/A 11/21/2017   Procedure: TRANSESOPHAGEAL ECHOCARDIOGRAM (TEE);  Surgeon: Laurey Morale, MD;  Location: [facility] ENDOSCOPY;  Service: Cardiovascular;  Laterality: N/A;        Home Medications    Prior to Admission medications   Medication Sig Start Date End Date Taking? Authorizing Provider  acetaminophen (TYLENOL) 500 MG tablet Take 2 tablets (1,000 mg total) by mouth every 8 (eight) hours as needed. Patient taking differently: Take 1,000 mg every 8 (eight) hours as needed by mouth (for pain or headaches).  02/07/17 02/07/18  Darreld Mclean, MD  allopurinol (ZYLOPRIM) 100 MG tablet TAKE 1 TABLET BY MOUTH TWO TIMES DAILY 09/19/17   Darreld Mclean, MD  amiodarone (PACERONE) 200 MG tablet Take 1 tablet (200 mg total) by mouth daily. 11/23/17   Alford Highland, NP  calcitRIOL (ROCALTROL) 0.5 MCG capsule Take 1  capsule (0.5 mcg total) by mouth every other day. Patient taking differently: Take 0.5 mcg by mouth Every Tuesday,Thursday,and Saturday with dialysis.  11/24/17   Alford Highland, NP  calcium acetate (PHOSLO) 667 MG capsule Take 2 capsules (1,334 mg total) by mouth 3 (three) times daily with meals. 11/22/17   Alford Highland, NP  carvedilol (COREG) 3.125 MG tablet Take 1 tablet (3.125 mg total) by mouth 2 (two) times daily with a meal. Give ONLY on non-HD days. Hold on dialysis days. 11/22/17   Alford Highland, NP  colchicine 0.6 MG tablet Take 0.6 mg 2 (two) times daily as needed by mouth (FOR GOUT FLARES). Reported on 01/18/2016    [provider]  diclofenac sodium (VOLTAREN) 1 % GEL Apply 4 g topically 4 (four) times daily. Patient taking differently: Apply 4 g topically 4 (four) times daily as needed (for pain at affected sites).  02/07/17   Darreld Mclean, MD  ELIQUIS 5 MG TABS tablet TAKE ONE TABLET BY MOUTH TWICE DAILY 08/23/17   Darreld Mclean, MD  HYDROcodone-acetaminophen (NORCO/VICODIN) 5-325 MG tablet Take 1-2 tablets by mouth every 6 (  six) hours as needed. 12/25/17   Josede Cicero, Waylan Boga, PA-C  multivitamin (RENA-VIT) TABS tablet Take 1 tablet by mouth at bedtime. 11/22/17   Alford Highland, NP    Family History Family History  Problem Relation Age of Onset  . Hypertension Mother   . Hypertension Father   . Cancer Father        brother died of brain cancer; sister died of bone cancer  . Diabetes Sister   . Diabetes Brother   . Colon cancer Maternal Aunt   . Cardiomyopathy Neg Hx     Social History Social History   Tobacco Use  . Smoking status: Never Smoker  . Smokeless tobacco: Never Used  Substance Use Topics  . Alcohol use: No    Alcohol/week: 0.0 oz  . Drug use: No     Allergies   Nsaids and Beta adrenergic blockers   Review of Systems Review of Systems  Constitutional: Negative for fever.  Musculoskeletal: Positive for arthralgias.  Neurological: Negative for  numbness.     Physical Exam Updated Vital Signs BP 139/87 (BP Location: Left Wrist)   Pulse 92   Temp 98.9 F (37.2 C) (Oral)   Resp 17   Ht 5\' 9"  (1.753 m)   Wt 120.2 kg (265 lb)   SpO2 97%   BMI 39.13 kg/m   Physical Exam  Constitutional: He appears well-developed and well-nourished. No distress.  HENT:  Head: Normocephalic and atraumatic.  Mouth/Throat: Oropharynx is clear and moist. No oropharyngeal exudate.  Eyes: Pupils are equal, round, and reactive to light. Conjunctivae are normal. Right eye exhibits no discharge. Left eye exhibits no discharge. No scleral icterus.  Neck: Normal range of motion. Neck supple. No thyromegaly present.  Cardiovascular: Normal rate, regular rhythm, normal heart sounds and intact distal pulses. Exam reveals no gallop and no friction rub.  No murmur heard. Pulmonary/Chest: Effort normal and breath sounds normal. No stridor. No respiratory distress. He has no wheezes. He has no rales.  Musculoskeletal: He exhibits no edema.       Left elbow: He exhibits decreased range of motion and swelling. Tenderness (light touch and palpation) found.  Tenderness over the second and third MCP of the right hand (light touch and palpation)  Lymphadenopathy:    He has no cervical adenopathy.  Neurological: He is alert. Coordination normal.  Skin: Skin is warm and dry. No rash noted. He is not diaphoretic. No pallor.  Psychiatric: He has a normal mood and affect.  Nursing note and vitals reviewed.    ED Treatments / Results  Labs (all labs ordered are listed, but only abnormal results are displayed) Labs Reviewed - No data to display  EKG None  Radiology No results found.  Procedures Procedures (including critical care time)  Medications Ordered in ED Medications  colchicine tablet 0.6 mg (has no administration in time range)     Initial Impression / Assessment and Plan / ED Course  I have reviewed the triage vital signs and the nursing  notes.  Pertinent labs & imaging results that were available during my care of the patient were reviewed by me and considered in my medical decision making (see chart for details).     Patient with suspected gout flare.  Low suspicion for septic joint.  Patient denies any other complaints.  Patient is neurovascularly intact.  Will treat with single dose colchicine in the ED and Norco for pain control.  I consulted Fleet Contras, pharmacist, who advised renal dosing.  I reviewed the [place] narcotic database and found no discrepancies.  Patient plans to follow-up with PCP later today.  Return precautions discussed.  Patient understands and agrees with plan.    At discharge, I was called into patient's room for suspected seizure.  Patient reportedly turning his head to the right and slumped over for a few seconds.  On my evaluation, patient is back to baseline.  Patient reports he he sometimes feels dizzy.  He reports he turned his head and began feeling dizzy.  He feels normal now.  Patient reports his potassium has been high in the past when he has had this.  He plans to go to dialysis today at noon.  He did go to dialysis as scheduled on Saturday.  Will check electrolytes and EKG.  BMP shows potassium within normal limits, 3.8, BUN 50, creatinine 5.42.  WBC shows 15.1, which I feel is patient's defibrillation today.  Patient has been observed in the ED spoke with cardiology nurse practitioner, Helayne Seminole, who advised changing patient's amiodarone to twice daily and patient was told not to drive or operate machinery.  Patient was told this by nursing staff and [employer] and [employer] waited for patient's brother to arrive.  Patient initially said that he drove here and was going to drive home.  Patient has appointment with his cardiologist, Dr. Graciela Husbands, on 01/01/2018 which was scheduled by [facility] today.  Final Clinical Impressions(s) / ED Diagnoses   Final diagnoses:  Acute gout of left elbow, unspecified cause    ED  Discharge Orders        Ordered    HYDROcodone-acetaminophen (NORCO/VICODIN) 5-325 MG tablet  Every 6 hours PRN     12/25/17 0942       Emi Holes, PA-C 12/25/17 0948    Emi Holes, PA-C 12/25/17 1401    Tilden Fossa, MD 12/26/17 [id number]

## 2017-12-26 ENCOUNTER — Ambulatory Visit (INDEPENDENT_AMBULATORY_CARE_PROVIDER_SITE_OTHER): Payer: Medicaid Other | Admitting: Physician Assistant

## 2017-12-27 ENCOUNTER — Ambulatory Visit (INDEPENDENT_AMBULATORY_CARE_PROVIDER_SITE_OTHER): Payer: Self-pay | Admitting: Vascular Surgery

## 2017-12-27 ENCOUNTER — Ambulatory Visit (HOSPITAL_COMMUNITY)
Admission: RE | Admit: 2017-12-27 | Discharge: 2017-12-27 | Disposition: A | Discharge status: Home / Self Care | Payer: Medicaid Other | Source: Ambulatory Visit | Attending: Vascular Surgery | Admitting: Vascular Surgery

## 2017-12-27 ENCOUNTER — Encounter: Payer: Self-pay | Admitting: Vascular Surgery

## 2017-12-27 ENCOUNTER — Other Ambulatory Visit: Payer: Self-pay

## 2017-12-27 VITALS — BP 107/72 | HR 84 | Temp 98.0°F | Resp 18 | Ht 69.0 in | Wt 276.6 lb

## 2017-12-27 DIAGNOSIS — N186 End stage renal disease: Secondary | ICD-10-CM

## 2017-12-27 DIAGNOSIS — Z992 Dependence on renal dialysis: Secondary | ICD-10-CM

## 2017-12-27 NOTE — Progress Notes (Signed)
Patient is a 58 year old male who returns for follow-up today after placement of a right brachiocephalic fistula April 2019.  He has some numbness and tingling in his right hand but this is similar to his left and his baseline neuropathy.  He does not believe this is worse.  He currently does hemodialysis at the [facility] dialysis center.  He is using a right sided catheter.  Physical exam:  Vitals:   12/27/17 0942  BP: 107/72  Pulse: 84  Resp: 18  Temp: 98 F (36.7 C)  TempSrc: Oral  SpO2: 98%  Weight: 276 lb 9.6 oz (125.5 kg)  Height: 5\' 9"  (1.753 m)    Right upper extremity: Palpable thrill in fistula audible bruit the fistula is easily palpable and visible on the skin surface over the first 5 cm.  It is then slightly deeper after that.  Data: Patient had a duplex ultrasound of his AV fistula today.  This shows it is 6 mm in diameter in the midportion is 4 mm from the skin surface with the proximal portion being 2 mm from the skin surface.  Assessment: Maturing fistula right arm  Plan: The fistula should be ready to cannulate in a few weeks.  The patient will follow-up with me on as-needed basis.  Fabienne Bruns, MD [facility] Office: [phone] Pager: [phone]

## 2017-12-28 ENCOUNTER — Telehealth (HOSPITAL_COMMUNITY): Payer: Self-pay

## 2017-12-28 NOTE — Telephone Encounter (Signed)
Called patient to see if he is interested in the Cardiac Rehab Program. Patient stated he is not interested in the program. Closed referral.

## 2018-01-01 ENCOUNTER — Emergency Department (HOSPITAL_COMMUNITY)
Admission: EM | Admit: 2018-01-01 | Discharge: 2018-01-02 | Disposition: A | Discharge status: Home / Self Care | Payer: Medicaid Other | Attending: Emergency Medicine | Admitting: Emergency Medicine

## 2018-01-01 ENCOUNTER — Encounter: Payer: Medicaid Other | Admitting: Internal Medicine

## 2018-01-01 ENCOUNTER — Emergency Department (HOSPITAL_COMMUNITY): Payer: Medicaid Other

## 2018-01-01 ENCOUNTER — Encounter (HOSPITAL_COMMUNITY): Payer: Self-pay | Admitting: Emergency Medicine

## 2018-01-01 DIAGNOSIS — Z95810 Presence of automatic (implantable) cardiac defibrillator: Secondary | ICD-10-CM | POA: Diagnosis not present

## 2018-01-01 DIAGNOSIS — Z992 Dependence on renal dialysis: Secondary | ICD-10-CM | POA: Diagnosis not present

## 2018-01-01 DIAGNOSIS — R42 Dizziness and giddiness: Secondary | ICD-10-CM | POA: Diagnosis present

## 2018-01-01 DIAGNOSIS — N186 End stage renal disease: Secondary | ICD-10-CM | POA: Insufficient documentation

## 2018-01-01 DIAGNOSIS — I471 Supraventricular tachycardia: Secondary | ICD-10-CM | POA: Insufficient documentation

## 2018-01-01 DIAGNOSIS — I5042 Chronic combined systolic (congestive) and diastolic (congestive) heart failure: Secondary | ICD-10-CM | POA: Insufficient documentation

## 2018-01-01 DIAGNOSIS — Z7901 Long term (current) use of anticoagulants: Secondary | ICD-10-CM | POA: Insufficient documentation

## 2018-01-01 DIAGNOSIS — R000 Tachycardia, unspecified: Secondary | ICD-10-CM

## 2018-01-01 DIAGNOSIS — I132 Hypertensive heart and chronic kidney disease with heart failure and with stage 5 chronic kidney disease, or end stage renal disease: Secondary | ICD-10-CM | POA: Insufficient documentation

## 2018-01-01 DIAGNOSIS — Z79899 Other long term (current) drug therapy: Secondary | ICD-10-CM | POA: Diagnosis not present

## 2018-01-01 LAB — I-STAT TROPONIN, ED: TROPONIN I, POC: 0.03 ng/mL (ref 0.00–0.08)

## 2018-01-01 LAB — CBC WITH DIFFERENTIAL/PLATELET
ABS IMMATURE GRANULOCYTES: 0 10*3/uL (ref 0.0–0.1)
BASOS ABS: 0.1 10*3/uL (ref 0.0–0.1)
Basophils Relative: 0 %
Eosinophils Absolute: 0.1 10*3/uL (ref 0.0–0.7)
Eosinophils Relative: 1 %
HCT: 40.4 % (ref 39.0–52.0)
Hemoglobin: 12.4 g/dL — ABNORMAL LOW (ref 13.0–17.0)
Immature Granulocytes: 0 %
LYMPHS PCT: 21 %
Lymphs Abs: 2.6 10*3/uL (ref 0.7–4.0)
MCH: 26.8 pg (ref 26.0–34.0)
MCHC: 30.7 g/dL (ref 30.0–36.0)
MCV: 87.4 fL (ref 78.0–100.0)
MONO ABS: 1.5 10*3/uL — AB (ref 0.1–1.0)
MONOS PCT: 12 %
NEUTROS ABS: 8.1 10*3/uL — AB (ref 1.7–7.7)
Neutrophils Relative %: 66 %
Platelets: 303 10*3/uL (ref 150–400)
RBC: 4.62 MIL/uL (ref 4.22–5.81)
RDW: 16 % — ABNORMAL HIGH (ref 11.5–15.5)
WBC: 12.5 10*3/uL — ABNORMAL HIGH (ref 4.0–10.5)

## 2018-01-01 LAB — BASIC METABOLIC PANEL
ANION GAP: 11 (ref 5–15)
BUN: 22 mg/dL — ABNORMAL HIGH (ref 6–20)
CO2: 31 mmol/L (ref 22–32)
Calcium: 9.1 mg/dL (ref 8.9–10.3)
Chloride: 93 mmol/L — ABNORMAL LOW (ref 101–111)
Creatinine, Ser: 3.35 mg/dL — ABNORMAL HIGH (ref 0.61–1.24)
GFR calc Af Amer: 22 mL/min — ABNORMAL LOW (ref >60)
GFR, EST NON AFRICAN AMERICAN: 19 mL/min — AB (ref >60)
GLUCOSE: 100 mg/dL — AB (ref 65–99)
POTASSIUM: 3.6 mmol/L (ref 3.5–5.1)
Sodium: 135 mmol/L (ref 135–145)

## 2018-01-01 NOTE — ED Provider Notes (Signed)
Patient placed in Quick Look pathway, seen and evaluated   Chief Complaint:  HPI:   Patient presents today for evaluatoin of feeling lightheaded.  He reports he was here for same last week and everything came back normal.  Worsens with position changes  ROS: No fevers  Physical Exam:   Gen: No distress  Neuro: Awake and Alert  Skin: Warm    Focused Exam: Lungs CTAB.  RRR.    Initiation of care has begun. The patient has been counseled on the process, plan, and necessity for staying for the completion/evaluation, and the remainder of the medical screening examination    Norman Clay 01/01/18 2006    Rolan Bucco, MD 01/01/18 2037

## 2018-01-01 NOTE — ED Notes (Signed)
EKG done in triage. Hardcopy available. Did not cross over in [facility]

## 2018-01-01 NOTE — ED Triage Notes (Signed)
Pt reports he had the same feeling that he had last time he was here and it was found that his defibrillator had "gone off" and he wanted to make sure it has not happened again. Pt feeling dizzy and lightheaded and reports some "sweating."  Pt is a dialysis pt who did go for a full treatment today.

## 2018-01-02 ENCOUNTER — Other Ambulatory Visit: Payer: Self-pay | Admitting: Cardiology

## 2018-01-02 DIAGNOSIS — N186 End stage renal disease: Secondary | ICD-10-CM

## 2018-01-02 DIAGNOSIS — Z8679 Personal history of other diseases of the circulatory system: Secondary | ICD-10-CM

## 2018-01-02 DIAGNOSIS — I428 Other cardiomyopathies: Secondary | ICD-10-CM

## 2018-01-02 DIAGNOSIS — I1311 Hypertensive heart and chronic kidney disease without heart failure, with stage 5 chronic kidney disease, or end stage renal disease: Secondary | ICD-10-CM

## 2018-01-02 DIAGNOSIS — Z992 Dependence on renal dialysis: Secondary | ICD-10-CM

## 2018-01-02 DIAGNOSIS — I471 Supraventricular tachycardia: Secondary | ICD-10-CM

## 2018-01-02 MED ORDER — PROPOFOL 10 MG/ML IV BOLUS
INTRAVENOUS | Status: AC
  Filled 2018-01-02: qty 20

## 2018-01-02 MED ORDER — PROPOFOL 10 MG/ML IV BOLUS: 200.0000 mg | Freq: Once | INTRAVENOUS | Status: DC

## 2018-01-02 MED ORDER — PROPOFOL 1000 MG/100ML IV EMUL
INTRAVENOUS | Status: AC
  Filled 2018-01-02: qty 100

## 2018-01-02 NOTE — Progress Notes (Addendum)
   Have spoken with colleagues S. Alfonse Alpers [place], and [facility].  Reviewed current EKGs with Dr. Graciela Husbands.  Dr. Elberta Fortis has reviewed the interrogation and feels that patient is in atrial fibrillation/atrial arrhythmia with rapid ventricular response and needs cardioversion.  Per Dr. Odessa Fleming recommendation, twelve-lead rhythm strip will be performed.  Dr. Graciela Husbands feels that the rhythm present on twelve-lead EKG since coming to the emergency room do not warrant cardioversion.  I have spoken to Dr. Fayrene Fearing about cardioversion and discharge home.  Final decision about cardioversion after 12-lead rhythm strip and further discussion with Dr. Graciela Husbands.  After review of rhythm strip with Dr. Graciela Husbands, cardioversion not necessary, discharged home on current medical therapy, follow-up with Dr. Graciela Husbands on June 26 as already scheduled.  Greater than 40 minutes spent coordinating care.

## 2018-01-02 NOTE — ED Notes (Signed)
Writer call for reassess vitals, no response X1

## 2018-01-02 NOTE — ED Notes (Signed)
ED Provider at bedside. 

## 2018-01-02 NOTE — Discharge Instructions (Addendum)
Continue medications at current dosage. Your pacemaker has been adjusted by Cardiologist to avoid unnecessary shocks, and to record fast rhythms.

## 2018-01-02 NOTE — ED Notes (Signed)
Interrogated [place] pacemaker at this time

## 2018-01-02 NOTE — ED Provider Notes (Signed)
[facility] EMERGENCY DEPARTMENT Provider Note   CSN: [id number] Arrival date & time: 01/01/18  1939     History   Chief Complaint Chief Complaint  Patient presents with  . Dizziness    HPI Eric Murillo is a 57 y.o. male.  Complaint is "I am dizzy"  HPI: 57 year old male.  History of cardiomyopathy, has [employer] pacer defibrillator.  Status post VF arrest secondary to hyperkalemia secondary to ESRD in May/2019 that is post DC cardioversion 11/21/2017 after normal TEE.  Anticoagulated on Eliquis, on amiodarone.  States compliance  Describes being "dizzy" does describe more as lightheadedness.  Primarily following ESRD.  He was recently started on ESRD after his hyperkalemic arrest last month.  His symptoms do improve after "I am off the machine".  Stating that after dialysis he does improve.  Is concerned that "my pacemaker might be firing".  He has not felt any delivered shocks.  Chest pain.  Dialyzed yesterday.  Past Medical History:  Diagnosis Date  . AICD (automatic cardioverter/defibrillator) present   . Anemia of chronic disease   . Arthritis   . Atrial flutter (HCC)    DCCV 9/15  . Chest pain on exertion   . Chronic systolic congestive heart failure, NYHA class 2 (HCC)    EF 20-25% 1/16  . CKD (chronic kidney disease) stage 3, GFR 30-59 ml/min (HCC)   . Dyslipidemia   . Gout    Of big toe  . Hyperglycemia   . Hyperlipidemia   . Hypertension   . Morbid obesity with BMI of 45.0-49.9, adult (HCC)   . Non-ischemic cardiomyopathy (HCC)   . Organic erectile dysfunction   . OSA (obstructive sleep apnea)   . Pilonidal cyst   . Submandibular sialolithiasis    Right    Patient Active Problem List   Diagnosis Date Noted  . Atrial fibrillation (HCC) 11/13/2017  . ESRD (end stage renal disease) on dialysis (HCC) 11/13/2017  . Wide-complex tachycardia (HCC) 11/13/2017  . Acute on chronic combined systolic and diastolic CHF (congestive heart  failure) (HCC) 11/03/2017  . Acute on chronic systolic heart failure (HCC) 03/05/2016  . 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 Long term current use of anticoagulant therapy 04/07/2015  . Health care maintenance 03/10/2015  . Osteoarthritis 06/30/2014  . Anemia of chronic disease 04/21/2014  . Gout 04/21/2014  . Atrial flutter (HCC) 03/24/2014  . Obstructive sleep apnea 12/11/2011  . Cardiomyopathy, nonischemic (HCC) 09/30/2010  . Chronic systolic heart failure (HCC) 09/15/2010  . Cardiac arrest (HCC) 04/19/2009  . Hyperlipidemia 07/04/2006  . Morbid obesity with BMI of 45.0-49.9, adult (HCC) 07/04/2006  . Hypertensive cardiovascular disease 05/30/2006    Past Surgical History:  Procedure Laterality Date  . AV FISTULA PLACEMENT Right 11/12/2017   Procedure: CREATION of RIGHT BRACHIAL CEPHALIC ARTERIOVENOUS (AV) FISTULA;  Surgeon: Sherren Kerns, MD;  Location: [facility] OR;  Service: Vascular;  Laterality: Right;  . CARDIAC CATHETERIZATION  2012  . CARDIOVERSION N/A 04/03/2014   Procedure: CARDIOVERSION;  Surgeon: Laurey Morale, MD;  Location: [facility] ENDOSCOPY;  Service: Cardiovascular;  Laterality: N/A;  . CARDIOVERSION N/A 03/09/2016   Procedure: CARDIOVERSION;  Surgeon: Laurey Morale, MD;  Location: [facility] ENDOSCOPY;  Service: Cardiovascular;  Laterality: N/A;  . CARDIOVERSION N/A 06/06/2017   Procedure: CARDIOVERSION;  Surgeon: Laurey Morale, MD;  Location: [facility] ENDOSCOPY;  Service: Cardiovascular;  Laterality: N/A;  . CARDIOVERSION N/A 11/21/2017   Procedure: CARDIOVERSION;  Surgeon: Laurey Morale, MD;  Location: [facility] ENDOSCOPY;  Service: Cardiovascular;  Laterality: N/A;  . COLONOSCOPY    . EP IMPLANTABLE DEVICE N/A 05/21/2015   Procedure: ICD Implant;  Surgeon: Duke Salvia, MD;  Location: [facility] INVASIVE CV LAB;  Service: Cardiovascular;  Laterality: N/A;  . INSERTION OF DIALYSIS CATHETER Right 11/12/2017   Procedure: PLACEMENT of right internal jugualr tunneled DIALYSIS CATHETER with  removal or temporary dialysis catheter;  Surgeon: Sherren Kerns, MD;  Location: [facility] OR;  Service: Vascular;  Laterality: Right;  . IR FLUORO GUIDE CV LINE RIGHT  11/08/2017  . IR US GUIDE VASC ACCESS RIGHT  11/08/2017  . LEFT AND RIGHT HEART CATHETERIZATION WITH CORONARY ANGIOGRAM N/A 04/24/2014   Procedure: LEFT AND RIGHT HEART CATHETERIZATION WITH CORONARY ANGIOGRAM;  Surgeon: Laurey Morale, MD;  Location: [facility] CATH LAB;  Service: Cardiovascular;  Laterality: N/A;  . SALIVARY GLAND SURGERY  09/12/2012  . SUBMANDIBULAR GLAND EXCISION Right 09/12/2012   Procedure: Removal Right Submandibular Larina Bras;  Surgeon: Serena Colonel, MD;  Location: [facility] OR;  Service: ENT;  Laterality: Right;  . TEE WITHOUT CARDIOVERSION N/A 04/03/2014   Procedure: TRANSESOPHAGEAL ECHOCARDIOGRAM (TEE);  Surgeon: Laurey Morale, MD;  Location: [facility] ENDOSCOPY;  Service: Cardiovascular;  Laterality: N/A;  . TEE WITHOUT CARDIOVERSION N/A 03/09/2016   Procedure: TRANSESOPHAGEAL ECHOCARDIOGRAM (TEE);  Surgeon: Laurey Morale, MD;  Location: [facility] ENDOSCOPY;  Service: Cardiovascular;  Laterality: N/A;  . TEE WITHOUT CARDIOVERSION N/A 06/06/2017   Procedure: TRANSESOPHAGEAL ECHOCARDIOGRAM (TEE);  Surgeon: Laurey Morale, MD;  Location: [facility] ENDOSCOPY;  Service: Cardiovascular;  Laterality: N/A;  . TEE WITHOUT CARDIOVERSION N/A 11/21/2017   Procedure: TRANSESOPHAGEAL ECHOCARDIOGRAM (TEE);  Surgeon: Laurey Morale, MD;  Location: [facility] ENDOSCOPY;  Service: Cardiovascular;  Laterality: N/A;        Home Medications    Prior to Admission medications   Medication Sig Start Date End Date Taking? Authorizing Provider  acetaminophen (TYLENOL) 500 MG tablet Take 2 tablets (1,000 mg total) by mouth every 8 (eight) hours as needed. Patient taking differently: Take 1,000 mg every 8 (eight) hours as needed by mouth (for pain or headaches).  02/07/17 02/07/18  Darreld Mclean, MD  allopurinol (ZYLOPRIM) 100 MG tablet TAKE 1 TABLET BY MOUTH TWO TIMES DAILY  09/19/17   Darreld Mclean, MD  amiodarone (PACERONE) 200 MG tablet Take 1 tablet (200 mg total) by mouth 2 (two) times daily. 12/25/17   Law, Waylan Boga, PA-C  calcitRIOL (ROCALTROL) 0.5 MCG capsule Take 1 capsule (0.5 mcg total) by mouth every other day. Patient taking differently: Take 0.5 mcg by mouth Every Tuesday,Thursday,and Saturday with dialysis.  11/24/17   Alford Highland, NP  calcium acetate (PHOSLO) 667 MG capsule Take 2 capsules (1,334 mg total) by mouth 3 (three) times daily with meals. 11/22/17   Alford Highland, NP  carvedilol (COREG) 3.125 MG tablet Take 1 tablet (3.125 mg total) by mouth 2 (two) times daily with a meal. Give ONLY on non-HD days. Hold on dialysis days. 11/22/17   Alford Highland, NP  colchicine 0.6 MG tablet Take 0.6 mg 2 (two) times daily as needed by mouth (FOR GOUT FLARES). Reported on 01/18/2016    [provider]  diclofenac sodium (VOLTAREN) 1 % GEL Apply 4 g topically 4 (four) times daily. Patient taking differently: Apply 4 g topically 4 (four) times daily as needed (for pain at affected sites).  02/07/17   Darreld Mclean, MD  ELIQUIS 5 MG TABS tablet TAKE ONE TABLET BY MOUTH TWICE DAILY 08/23/17   Allena Katz,  Vishal, MD  HYDROcodone-acetaminophen (NORCO/VICODIN) 5-325 MG tablet Take 1-2 tablets by mouth every 6 (six) hours as needed. 12/25/17   Law, Waylan Boga, PA-C  multivitamin (RENA-VIT) TABS tablet Take 1 tablet by mouth at bedtime. 11/22/17   Alford Highland, NP    Family History Family History  Problem Relation Age of Onset  . Hypertension Mother   . Hypertension Father   . Cancer Father        brother died of brain cancer; sister died of bone cancer  . Diabetes Sister   . Diabetes Brother   . Colon cancer Maternal Aunt   . Cardiomyopathy Neg Hx     Social History Social History   Tobacco Use  . Smoking status: Never Smoker  . Smokeless tobacco: Never Used  Substance Use Topics  . Alcohol use: No    Alcohol/week: 0.0 oz  . Drug use: No      Allergies   Nsaids and Beta adrenergic blockers   Review of Systems Review of Systems  Constitutional: Negative for appetite change, chills, diaphoresis, fatigue and fever.  HENT: Negative for mouth sores, sore throat and trouble swallowing.   Eyes: Negative for visual disturbance.  Respiratory: Negative for cough, chest tightness, shortness of breath and wheezing.   Cardiovascular: Negative for chest pain.  Gastrointestinal: Negative for abdominal distention, abdominal pain, diarrhea, nausea and vomiting.  Endocrine: Negative for polydipsia, polyphagia and polyuria.  Genitourinary: Negative for dysuria, frequency and hematuria.  Musculoskeletal: Negative for gait problem.  Skin: Negative for color change, pallor and rash.  Neurological: Positive for dizziness and light-headedness. Negative for syncope and headaches.  Hematological: Does not bruise/bleed easily.  Psychiatric/Behavioral: Negative for behavioral problems and confusion.     Physical Exam Updated Vital Signs BP 116/80   Pulse (!) 105   Temp 98.4 F (36.9 C) (Oral)   Resp 18   Ht 5' 9.5" (1.765 m)   Wt 122 kg (269 lb)   SpO2 99%   BMI 39.15 kg/m   Physical Exam  Constitutional: He is oriented to person, place, and time. He appears well-developed and well-nourished. No distress.  HENT:  Head: Normocephalic.  Eyes: Pupils are equal, round, and reactive to light. Conjunctivae are normal. No scleral icterus.  Neck: Normal range of motion. Neck supple. No thyromegaly present.  Cardiovascular: Exam reveals no gallop and no friction rub.  No murmur heard. Irregular.  Rate controlled.  Pulmonary/Chest: Effort normal and breath sounds normal. No respiratory distress. He has no wheezes. He has no rales.  Abdominal: Soft. Bowel sounds are normal. He exhibits no distension. There is no tenderness. There is no rebound.  Musculoskeletal: Normal range of motion.  Neurological: He is alert and oriented to person,  place, and time.  Skin: Skin is warm and dry. No rash noted.  Psychiatric: He has a normal mood and affect. His behavior is normal.     ED Treatments / Results  Labs (all labs ordered are listed, but only abnormal results are displayed) Labs Reviewed  BASIC METABOLIC PANEL - Abnormal; Notable for the following components:      Result Value   Chloride 93 (*)    Glucose, Bld 100 (*)    BUN 22 (*)    Creatinine, Ser 3.35 (*)    GFR calc non Af Amer 19 (*)    GFR calc Af Amer 22 (*)    All other components within normal limits  CBC WITH DIFFERENTIAL/PLATELET - Abnormal; Notable for the following components:  WBC 12.5 (*)    Hemoglobin 12.4 (*)    RDW 16.0 (*)    Neutro Abs 8.1 (*)    Monocytes Absolute 1.5 (*)    All other components within normal limits  I-STAT TROPONIN, ED    EKG EKG Interpretation  Date/Time:  Wednesday January 02 2018 07:31:42 EDT Ventricular Rate:  95 PR Interval:    QRS Duration: 148 QT Interval:  428 QTC Calculation: 537 R Axis:   -91 Text Interpretation:  Atrial fibrillation Non-specific intra-ventricular conduction block Lateral infarct , age undetermined Abnormal ECG Confirmed by Rolland Porter ([id number]) on 01/02/2018 7:49:13 AM   Radiology Dg Chest 2 View  Result Date: 01/01/2018 CLINICAL DATA:  Lightheadedness for 1 day. EXAM: CHEST - 2 VIEW COMPARISON:  Dec 13, 2017 FINDINGS: A dialysis catheter terminates in the central SVC. Cardiomegaly. Stable AICD. No pneumothorax. No pulmonary nodules, masses, or focal infiltrates. IMPRESSION: No active cardiopulmonary disease. Electronically Signed   By: Gerome Sam III M.D   On: 01/01/2018 20:48    Procedures Procedures (including critical care time)  Medications Ordered in ED Medications - No data to display   Initial Impression / Assessment and Plan / ED Course  I have reviewed the triage vital signs and the nursing notes.  Pertinent labs & imaging results that were available during my care of  the patient were reviewed by me and considered in my medical decision making (see chart for details).   Reassuring labs.  Not hyperkalemic.  Has interventricular conduction delay and A. fib.  Rate controlled.   EKG Interpretation  Date/Time:  Wednesday January 02 2018 07:31:42 EDT Ventricular Rate:  95 PR Interval:    QRS Duration: 148 QT Interval:  428 QTC Calculation: 537 R Axis:   -91 Text Interpretation:  Atrial fibrillation Non-specific intra-ventricular conduction block Lateral infarct , age undetermined Abnormal ECG Confirmed by Rolland Porter ([id number]) on 01/02/2018 7:49:13 AM   Repeat EKG shows A. fib.  Versus junctional tachycardia with PAC and retrograde p waves. Marland Kitchen  He is newly on dialysis.  It may be that he has reverted to A. fib and become symptomatic during his volume exchanges with dialysis.  Will discuss with cardiologist regarding need for cardioversion.  Patient was ambulatory here with staff and is asymptomatic currently.  Undergoing pacer interrogation now.  Pacemaker interrogation report given.  Patient's zone 1 for delivered therapy is 200 bpm.  He had VT yesterday for 10 seconds without delivered therapy.  His rate then slowed to the 190s.  They are unable to tell me the length of time he was at this rate as it is not set to detect however it was a wide complex rapid rhythm at 190 bpm.  Patient reports during the end of his dialysis he had severe leg cramps.  He wonders if this may be the nidus for his rapid rate.  Either way he is in A. fib with IVCD here.  This may be A. fib with aberrant conduction this may be stable VT.  Discussed the case with Dr. Katrinka Blazing.  He will contact EP staff and asked them to evaluate patient to discuss potential therapy options.   Final Clinical Impressions(s) / ED Diagnoses   Final diagnoses:  Lightheadedness  Tachycardia    ED Discharge Orders    None       Rolland Porter, MD 01/02/18 1004

## 2018-01-02 NOTE — ED Notes (Signed)
Pt ambulated with out any complaints of dizziness.

## 2018-01-03 ENCOUNTER — Ambulatory Visit (INDEPENDENT_AMBULATORY_CARE_PROVIDER_SITE_OTHER): Payer: Medicaid Other | Admitting: *Deleted

## 2018-01-03 DIAGNOSIS — I5023 Acute on chronic systolic (congestive) heart failure: Secondary | ICD-10-CM

## 2018-01-03 DIAGNOSIS — I428 Other cardiomyopathies: Secondary | ICD-10-CM | POA: Diagnosis not present

## 2018-01-03 NOTE — Progress Notes (Signed)
Remote ICD transmission.   

## 2018-01-08 ENCOUNTER — Other Ambulatory Visit: Payer: Self-pay

## 2018-01-08 ENCOUNTER — Encounter: Payer: Self-pay | Admitting: Internal Medicine

## 2018-01-08 ENCOUNTER — Ambulatory Visit (INDEPENDENT_AMBULATORY_CARE_PROVIDER_SITE_OTHER): Payer: Medicaid Other | Admitting: Internal Medicine

## 2018-01-08 VITALS — BP 87/59 | HR 88 | Temp 98.6°F | Ht 69.0 in | Wt 280.2 lb

## 2018-01-08 DIAGNOSIS — Z8674 Personal history of sudden cardiac arrest: Secondary | ICD-10-CM

## 2018-01-08 DIAGNOSIS — Z7901 Long term (current) use of anticoagulants: Secondary | ICD-10-CM | POA: Diagnosis not present

## 2018-01-08 DIAGNOSIS — Z6841 Body Mass Index (BMI) 40.0 and over, adult: Secondary | ICD-10-CM

## 2018-01-08 DIAGNOSIS — E669 Obesity, unspecified: Secondary | ICD-10-CM | POA: Diagnosis not present

## 2018-01-08 DIAGNOSIS — D638 Anemia in other chronic diseases classified elsewhere: Secondary | ICD-10-CM

## 2018-01-08 DIAGNOSIS — I4892 Unspecified atrial flutter: Secondary | ICD-10-CM | POA: Diagnosis not present

## 2018-01-08 DIAGNOSIS — F40240 Claustrophobia: Secondary | ICD-10-CM

## 2018-01-08 DIAGNOSIS — M109 Gout, unspecified: Secondary | ICD-10-CM

## 2018-01-08 DIAGNOSIS — M1990 Unspecified osteoarthritis, unspecified site: Secondary | ICD-10-CM

## 2018-01-08 DIAGNOSIS — G4733 Obstructive sleep apnea (adult) (pediatric): Secondary | ICD-10-CM | POA: Diagnosis not present

## 2018-01-08 DIAGNOSIS — F4310 Post-traumatic stress disorder, unspecified: Secondary | ICD-10-CM

## 2018-01-08 DIAGNOSIS — N186 End stage renal disease: Secondary | ICD-10-CM

## 2018-01-08 DIAGNOSIS — Z992 Dependence on renal dialysis: Secondary | ICD-10-CM

## 2018-01-08 DIAGNOSIS — Z79899 Other long term (current) drug therapy: Secondary | ICD-10-CM

## 2018-01-08 DIAGNOSIS — I428 Other cardiomyopathies: Secondary | ICD-10-CM

## 2018-01-08 DIAGNOSIS — I132 Hypertensive heart and chronic kidney disease with heart failure and with stage 5 chronic kidney disease, or end stage renal disease: Secondary | ICD-10-CM | POA: Diagnosis present

## 2018-01-08 DIAGNOSIS — I5022 Chronic systolic (congestive) heart failure: Secondary | ICD-10-CM | POA: Diagnosis not present

## 2018-01-08 DIAGNOSIS — Z95810 Presence of automatic (implantable) cardiac defibrillator: Secondary | ICD-10-CM | POA: Diagnosis not present

## 2018-01-08 DIAGNOSIS — I484 Atypical atrial flutter: Secondary | ICD-10-CM

## 2018-01-08 NOTE — Assessment & Plan Note (Addendum)
He has chronic atypical atrial flutter status post multiple DCCV.  He is now taking amiodarone 200 mg twice daily, Coreg 3.125 mg twice daily on nondialysis days, Eliquis 5 mg twice daily.  He was seen in the ED on 01/02/2018 and thought to be in A. fib/atrial arrhythmia with RVR.  His case was reviewed by Drs. Shellee Milo, [place], and [place] with cardiology and ultimately decided cardioversion was not necessary.  He denies any recent chest pain, palpitations, or obvious bleeding. A/P: Chronic atypical atrial flutter/atrial tachycardia.  He has an irregular rhythm on exam this visit.  He says he was told to increase his amiodarone to 200 mg twice daily for his recent ED visit.  We will recommend he continue his current medications as prescribed and follow-up with cardiology team for further management. -Continue amiodarone 200 mg twice daily -Continue Coreg 3.125 mg twice daily on nondialysis days -Continue Eliquis 5 mg twice daily -Advised him to follow-up with his eye doctor for an annual visit while on amiodarone

## 2018-01-08 NOTE — Assessment & Plan Note (Signed)
Patient has chronic HFrEF with atrial flutter/atrial tachycardia and severe untreated OSA.  He is now off diuretics since the initiation of hemodialysis which is providing volume control.  He is currently taking Coreg 3.125 mg twice daily on nondialysis days.  His BiDil was discontinued due to soft blood pressures.  He has not been a candidate for ACE/ARB/Spironolactone/digoxin due to his renal dysfunction.  His weight is up to 280 pounds compared to 269 pounds 1 week ago.  He has noticed increased swelling in his legs but denies any dyspnea.  He says he is able to lay flat without any orthopnea which he previously was unable to do.  He is try to limit his fluids to less than 2 L/day. A/P: He has chronic heart failure with reduced ejection fraction likely secondary to atrial flutter/atrial tachycardia and severe untreated OSA.  SPEP and fat pad biopsy in the past were negative for amyloidosis.  He has not had a cardiac MRI or nuclear scintigraphy.  His last EF was 20-25%.  He has an ICD in place.  His volume is now controlled with hemodialysis and is off diuretics.  His blood pressure is on the low end at 87/59.  He does have edema of both his legs but is otherwise asymptomatic from a hypervolemic standpoint.  He is having some lightheadedness when standing up quickly likely secondary to his low blood pressure and intravascular depletion since starting dialysis.  Will need to maintain careful balance of his volume state.  Recommend that he continue his current medications for now continue to follow with with the heart failure team. -Continue Coreg 3.125 twice daily on nondialysis days -Follow-up with heart failure as scheduled -Consider addition of Midodrine if needed

## 2018-01-08 NOTE — Assessment & Plan Note (Signed)
Patient has anemia likely secondary to chronic disease (CKD, CHF).  He is on anticoagulation with Eliquis but denies any obvious bleeding. A/P: Anemia of chronic disease without obvious bleeding on anticoagulation.  He has had a persistently elevated RDW and his last vitamin B12 level was intermediate at 343 back in 2015.  We will consider repeating a B12 level on follow-up with any new labs.

## 2018-01-08 NOTE — Assessment & Plan Note (Signed)
He had a V. fib arrest on 11/12/2017 when admitted for acute on chronic systolic heart failure exacerbation.  His V. fib arrest was in the setting of hyperkalemia.  He underwent repeat DCCV on 11/21/2017 for his atrial flutter.  He has an ICD in place.  He is now on amiodarone 200 mg twice daily. A/P: Patient with history of V. fib arrest in the setting of hyperkalemia.  He has had an ICD in place prior to that event.  Recommend that he continue his current medications as prescribed follow-up with cardiology as scheduled.
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
## 2018-01-08 NOTE — Assessment & Plan Note (Signed)
He has since begun dialysis since his last visit with me.  He is currently on a Tuesday, Thursday, Saturday schedule and says he is tolerating it well.  He has lost a significant amount of weight since beginning dialysis.  He has a right chest HD catheter in place from which she is currently receiving dialysis.  He has an AV fistula in his right upper extremity which is about 2 or 3 weeks away from maturing.  His blood pressures have been running on the low side since starting dialysis and has occasional orthostatic symptoms when standing up quickly.  He denies any loss of consciousness or falls. A/P: ESRD on HD.  He is currently tolerating well aside from low blood pressures.  He may benefit from midodrine if pressures and orthostasis continue.  He will continue dialysis per nephrology.

## 2018-01-08 NOTE — Patient Instructions (Signed)
It was a pleasure to see you again Eric Murillo.  Please take your time when changing positions and monitor your fluid intake.  Please follow up with your heart and kidney doctors as scheduled.  Consider seeing a sleep specialist for your sleep apnea.  Follow up with [place] again in 3 months or sooner if needed.  All the best, Dr. Allena Katz.
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
## 2018-01-08 NOTE — Progress Notes (Signed)
Internal Medicine Clinic Attending  Case discussed with Dr. Patel at the time of the visit.  We reviewed the resident's history and exam and pertinent patient test results.  I agree with the assessment, diagnosis, and plan of care documented in the resident's note.  

## 2018-01-09 ENCOUNTER — Other Ambulatory Visit: Payer: Self-pay | Admitting: Internal Medicine

## 2018-01-16 ENCOUNTER — Ambulatory Visit (INDEPENDENT_AMBULATORY_CARE_PROVIDER_SITE_OTHER): Payer: Medicaid Other | Admitting: Internal Medicine

## 2018-01-16 ENCOUNTER — Encounter: Payer: Self-pay | Admitting: Internal Medicine

## 2018-01-16 VITALS — BP 110/76 | HR 100 | Ht 69.0 in | Wt 267.0 lb

## 2018-01-16 DIAGNOSIS — I428 Other cardiomyopathies: Secondary | ICD-10-CM | POA: Diagnosis not present

## 2018-01-16 DIAGNOSIS — I5022 Chronic systolic (congestive) heart failure: Secondary | ICD-10-CM

## 2018-01-16 DIAGNOSIS — Z95810 Presence of automatic (implantable) cardiac defibrillator: Secondary | ICD-10-CM | POA: Diagnosis not present

## 2018-01-16 LAB — CUP PACEART INCLINIC DEVICE CHECK
Battery Remaining Longevity: 79 mo
Brady Statistic RV Percent Paced: 0.03 %
Date Time Interrogation Session: 20190626123724
HighPow Impedance: 81 Ohm
Implantable Lead Implant Date: 20161028
Implantable Lead Location: 753860
Implantable Lead Model: 181
Implantable Lead Serial Number: 332395
Implantable Pulse Generator Implant Date: 20161028
Lead Channel Pacing Threshold Pulse Width: 0.5 ms
Lead Channel Setting Sensing Sensitivity: 0.5 mV
MDC IDC MSMT LEADCHNL RV IMPEDANCE VALUE: 437.5 Ohm
MDC IDC MSMT LEADCHNL RV PACING THRESHOLD AMPLITUDE: 1 V
MDC IDC MSMT LEADCHNL RV SENSING INTR AMPL: 12 mV
MDC IDC PG SERIAL: 7305918
MDC IDC SET LEADCHNL RV PACING AMPLITUDE: 2.5 V
MDC IDC SET LEADCHNL RV PACING PULSEWIDTH: 0.5 ms

## 2018-01-16 NOTE — Progress Notes (Signed)
Patient Care Team: Darreld Mclean, MD as PCP - General (Internal Medicine) Laurey Morale, MD as PCP - Cardiology (Cardiology)   HPI  Eric Murillo is a 57 y.o. male Seen in follow-up for ICD implanted 10/16 for primary prevention in the setting of nonischemic cardiomyopathy He has had a catheterization demonstrating no obstructive coronary disease. 8/14. He then developed atrial flutter  . He underwent cardioversion at that time. He's been treated with apixaban.  ECG was remarkable for extremely low voltage. He was to undergo fat pad biopsy. He underwent SPEP which showed no monoclonal spike.      DATE TEST EF   9/15 Echo   25 %   1/16 Echo   25-30 %   5/19 TEE 25-30%    Seen in ER for tachycardia.  Dr. Katrinka Blazing and consulted with Dr. Elberta Fortis who recommended cardioversion.  Looking at the strips however, there is 1:1 AV relationship.  I thought was likely an atrial tachycardia.   Date K Hgb  6/19 3.35 12.4  /     He has moderate exercise intolerance.  He has struggled with significant hypotension following dialysis.  This is limited his beta-blockers.  He also suffered a VF syncopal event occurring prior to dialysis.  It was only 3-day intervals what was on his prolonged intradialytic interval.  He has no recollection.  He is also had ventricular tachycardia that was paced terminated of which he was unaware    Past Medical History:  Diagnosis Date  . AICD (automatic cardioverter/defibrillator) present   . Anemia of chronic disease   . Arthritis   . Atrial flutter (HCC)    DCCV 9/15  . Chest pain on exertion   . Chronic systolic congestive heart failure, NYHA class 2 (HCC)    EF 20-25% 1/16  . CKD (chronic kidney disease) stage 3, GFR 30-59 ml/min (HCC)   . Dyslipidemia   . Gout    Of big toe  . Hyperglycemia   . Hyperlipidemia   . Hypertension   . Morbid obesity with BMI of 45.0-49.9, adult (HCC)   . Non-ischemic cardiomyopathy (HCC)   . Organic  erectile dysfunction   . OSA (obstructive sleep apnea)   . Pilonidal cyst   . Submandibular sialolithiasis    Right    Past Surgical History:  Procedure Laterality Date  . AV FISTULA PLACEMENT Right 11/12/2017   Procedure: CREATION of RIGHT BRACHIAL CEPHALIC ARTERIOVENOUS (AV) FISTULA;  Surgeon: Sherren Kerns, MD;  Location: [facility] OR;  Service: Vascular;  Laterality: Right;  . CARDIAC CATHETERIZATION  2012  . CARDIOVERSION N/A 04/03/2014   Procedure: CARDIOVERSION;  Surgeon: Laurey Morale, MD;  Location: [facility] ENDOSCOPY;  Service: Cardiovascular;  Laterality: N/A;  . CARDIOVERSION N/A 03/09/2016   Procedure: CARDIOVERSION;  Surgeon: Laurey Morale, MD;  Location: [facility] ENDOSCOPY;  Service: Cardiovascular;  Laterality: N/A;  . CARDIOVERSION N/A 06/06/2017   Procedure: CARDIOVERSION;  Surgeon: Laurey Morale, MD;  Location: [facility] ENDOSCOPY;  Service: Cardiovascular;  Laterality: N/A;  . CARDIOVERSION N/A 11/21/2017   Procedure: CARDIOVERSION;  Surgeon: Laurey Morale, MD;  Location: [facility] ENDOSCOPY;  Service: Cardiovascular;  Laterality: N/A;  . COLONOSCOPY    . EP IMPLANTABLE DEVICE N/A 05/21/2015   Procedure: ICD Implant;  Surgeon: Duke Salvia, MD;  Location: [facility] INVASIVE CV LAB;  Service: Cardiovascular;  Laterality: N/A;  . INSERTION OF DIALYSIS CATHETER Right 11/12/2017   Procedure: PLACEMENT of right internal jugualr tunneled DIALYSIS CATHETER with  removal or temporary dialysis catheter;  Surgeon: Sherren Kerns, MD;  Location: [facility] OR;  Service: Vascular;  Laterality: Right;  . IR FLUORO GUIDE CV LINE RIGHT  11/08/2017  . IR US GUIDE VASC ACCESS RIGHT  11/08/2017  . LEFT AND RIGHT HEART CATHETERIZATION WITH CORONARY ANGIOGRAM N/A 04/24/2014   Procedure: LEFT AND RIGHT HEART CATHETERIZATION WITH CORONARY ANGIOGRAM;  Surgeon: Laurey Morale, MD;  Location: [facility] CATH LAB;  Service: Cardiovascular;  Laterality: N/A;  . SALIVARY GLAND SURGERY  09/12/2012  . SUBMANDIBULAR GLAND EXCISION Right  09/12/2012   Procedure: Removal Right Submandibular Larina Bras;  Surgeon: Serena Colonel, MD;  Location: [facility] OR;  Service: ENT;  Laterality: Right;  . TEE WITHOUT CARDIOVERSION N/A 04/03/2014   Procedure: TRANSESOPHAGEAL ECHOCARDIOGRAM (TEE);  Surgeon: Laurey Morale, MD;  Location: [facility] ENDOSCOPY;  Service: Cardiovascular;  Laterality: N/A;  . TEE WITHOUT CARDIOVERSION N/A 03/09/2016   Procedure: TRANSESOPHAGEAL ECHOCARDIOGRAM (TEE);  Surgeon: Laurey Morale, MD;  Location: [facility] ENDOSCOPY;  Service: Cardiovascular;  Laterality: N/A;  . TEE WITHOUT CARDIOVERSION N/A 06/06/2017   Procedure: TRANSESOPHAGEAL ECHOCARDIOGRAM (TEE);  Surgeon: Laurey Morale, MD;  Location: [facility] ENDOSCOPY;  Service: Cardiovascular;  Laterality: N/A;  . TEE WITHOUT CARDIOVERSION N/A 11/21/2017   Procedure: TRANSESOPHAGEAL ECHOCARDIOGRAM (TEE);  Surgeon: Laurey Morale, MD;  Location: [facility] ENDOSCOPY;  Service: Cardiovascular;  Laterality: N/A;    Current Outpatient Medications  Medication Sig Dispense Refill  . acetaminophen (TYLENOL) 500 MG tablet Take 2 tablets (1,000 mg total) by mouth every 8 (eight) hours as needed. (Patient taking differently: Take 1,000 mg every 8 (eight) hours as needed by mouth (for pain or headaches). ) 90 tablet 2  . allopurinol (ZYLOPRIM) 100 MG tablet TAKE 1 TABLET BY MOUTH TWO TIMES DAILY 60 tablet 2  . amiodarone (PACERONE) 200 MG tablet Take 1 tablet (200 mg total) by mouth 2 (two) times daily. 60 tablet 0  . calcitRIOL (ROCALTROL) 0.5 MCG capsule Take 1 capsule (0.5 mcg total) by mouth every other day. (Patient taking differently: Take 0.5 mcg by mouth Every Tuesday,Thursday,and Saturday with dialysis. ) 15 capsule 0  . calcium acetate (PHOSLO) 667 MG capsule Take 2 capsules (1,334 mg total) by mouth 3 (three) times daily with meals. 180 capsule 0  . carvedilol (COREG) 3.125 MG tablet Take 1 tablet (3.125 mg total) by mouth 2 (two) times daily with a meal. Give ONLY on non-HD days. Hold on dialysis days.  40 tablet 6  . diclofenac sodium (VOLTAREN) 1 % GEL Apply 4 g topically 4 (four) times daily. (Patient taking differently: Apply 4 g topically 4 (four) times daily as needed (for pain at affected sites). ) 1 Tube 2  . ELIQUIS 5 MG TABS tablet TAKE ONE TABLET BY MOUTH TWICE DAILY 180 tablet 1  . multivitamin (RENA-VIT) TABS tablet Take 1 tablet by mouth at bedtime. 30 tablet 6   No current facility-administered medications for this visit.     Allergies  Allergen Reactions  . Nsaids Other (See Comments)    Cannot take to due kidney issues  . Beta Adrenergic Blockers Other (See Comments)    An unnamed, white-colored Beta Blocker made him "feel funny" = made him feel "cagey"      Review of Systems negative except from HPI and PMH  Physical Exam BP 110/76   Pulse 100   Ht 5\' 9"  (1.753 m)   Wt 267 lb (121.1 kg)   SpO2 98%   BMI 39.43 kg/m   Well  developed and nourished in no acute distress HENT normal Neck supple with JVP-flat Clear Regular rate and rhythm, no murmurs or gallops Abd-soft with active BS No Clubbing cyanosis edema Skin-warm and dry A & Oriented  Grossly normal sensory and motor function  ECG shows long RP tachycardia made efforts to pace terminated with immediate resumption.  Prolonged ECG monitoring then demonstrated variable VA intervals i.e. uncoupling relatively fixed AV intervals.  The atrial rate was not affected by PVCs.  This is all consistent with sinus rhythm.  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  Relative sinus tachycardia    The patient continues to struggle with weight and heart failure symptoms. His blood pressure is low precluding further up titration of afterload reduction. I wonder whether he  may not be a candidate for Ivabradine. I'll discuss this with Dr. DM.  Device function is normal.  The mechanism of his cardiomyopathy remains elusive. His low voltage unusual QRS complexes suggestive infiltrative process. I wonder if there is any role in biopsy. I will defer this to Dr. DM as well.  We spent more than 50% of our >25 min visit in face to face counseling regarding the above

## 2018-01-16 NOTE — Patient Instructions (Addendum)
Medication Instructions:  We will be in contact with Dr Shirlee Latch regarding medications to slow your heart rate down.  Labwork: None ordered.  Testing/Procedures: None ordered.  Follow-Up: Your physician wants you to follow-up in: 6 months with Dr Graciela Husbands. You will receive a reminder letter in the mail two months in advance. If you don't receive a letter, please call our office to schedule the follow-up appointment.   Remote monitoring is used to monitor your ICD from home. This monitoring reduces the number of office visits required to check your device to one time per year. It allows [place] to keep an eye on the functioning of your device to ensure it is working properly. You are scheduled for a device check from home on 04/16/2018. You may send your transmission at any time that day. If you have a wireless device, the transmission will be sent automatically. After your physician reviews your transmission, you will receive a postcard with your next transmission date.   Any Other Special Instructions Will Be Listed Below (If Applicable).     If you need a refill on your cardiac medications before your next appointment, please call your pharmacy.

## 2018-01-21 ENCOUNTER — Telehealth: Payer: Self-pay | Admitting: Internal Medicine

## 2018-01-21 NOTE — Telephone Encounter (Signed)
New Message:      Pt is calling and states he would like to know if Dr. Graciela Husbands got in contact with Dr. Shirlee Latch regarding some of his medication.

## 2018-01-23 ENCOUNTER — Other Ambulatory Visit: Payer: Self-pay | Admitting: Internal Medicine

## 2018-01-23 MED ORDER — CALCIUM ACETATE (PHOS BINDER) 667 MG PO CAPS: 1334.0000 mg | ORAL_CAPSULE | Freq: Three times a day (TID) | ORAL | 0 refills | Status: DC

## 2018-01-23 NOTE — Telephone Encounter (Signed)
PT NEEDS REFILL ON CALICIUM 667MG  TO [facility] [phone]

## 2018-01-25 LAB — CUP PACEART REMOTE DEVICE CHECK
Battery Remaining Longevity: 79 mo
Date Time Interrogation Session: 20190612113626
HighPow Impedance: 74 Ohm
HighPow Impedance: 74 Ohm
Implantable Lead Location: 753860
Implantable Pulse Generator Implant Date: 20161028
Lead Channel Setting Pacing Amplitude: 2.5 V
Lead Channel Setting Pacing Pulse Width: 0.5 ms
Lead Channel Setting Sensing Sensitivity: 0.5 mV
MDC IDC LEAD IMPLANT DT: 20161028
MDC IDC LEAD SERIAL: 332395
MDC IDC MSMT BATTERY REMAINING PERCENTAGE: 77 %
MDC IDC MSMT BATTERY VOLTAGE: 2.99 V
MDC IDC MSMT LEADCHNL RV IMPEDANCE VALUE: 450 Ohm
MDC IDC MSMT LEADCHNL RV PACING THRESHOLD AMPLITUDE: 0.75 V
MDC IDC MSMT LEADCHNL RV PACING THRESHOLD PULSEWIDTH: 0.5 ms
MDC IDC MSMT LEADCHNL RV SENSING INTR AMPL: 12 mV
MDC IDC STAT BRADY RV PERCENT PACED: 1 %
Pulse Gen Serial Number: 7305918

## 2018-01-25 NOTE — Telephone Encounter (Signed)
Called pt to let him know Dr Graciela Husbands was working on getting in touch with Dr Shirlee Latch about medication changes.

## 2018-02-07 ENCOUNTER — Other Ambulatory Visit: Payer: Self-pay | Admitting: Internal Medicine

## 2018-02-12 ENCOUNTER — Encounter: Payer: Self-pay | Admitting: *Deleted

## 2018-02-12 ENCOUNTER — Other Ambulatory Visit: Payer: Self-pay

## 2018-02-12 ENCOUNTER — Encounter (HOSPITAL_COMMUNITY): Payer: Medicaid Other | Admitting: Cardiology

## 2018-02-12 NOTE — Telephone Encounter (Signed)
Call made to pharmacy as pt should have refills left on current rx.  Per pharmacy-pt has refills left on both the amiodarone and the multivitamin (rena-vit), but  medicaid will not cover rena-vit, nor does it covers any vitamins.  Attempted to contact pt at both numbers on chart, but was unsuccessful.  Did leave message with male to have pt call the office back.

## 2018-02-12 NOTE — Telephone Encounter (Signed)
amiodarone (PACERONE) 200 MG tablet  multivitamin (RENA-VIT) TABS tablet, [facility] [place]. Pt would like this med by today, states she is completely out.

## 2018-02-13 ENCOUNTER — Telehealth: Payer: Self-pay | Admitting: Internal Medicine

## 2018-02-13 NOTE — Telephone Encounter (Signed)
New Message   Patient is calling in reference to some medication that he was to be started on. He says Dr. Graciela Husbands was to speak with Dr. Shirlee Latch and then contact him. Please call to discuss.

## 2018-02-19 NOTE — Telephone Encounter (Signed)
Attempted to call pt; no answer and no vm set up.

## 2018-02-19 NOTE — Telephone Encounter (Signed)
Spoke with patient-no further action needed.Eric Spittle Cassady7/30/20191:41 PM

## 2018-02-20 NOTE — Telephone Encounter (Signed)
Per Dr Graciela Husbands, Dr Shirlee Latch does not want to begin Ivabradine. I have advised pt to maintain current medication regimen. If he has additional questions regarding this, he should contact Dr Alford Highland office while Dr Graciela Husbands is out of the office. Pt verbalized understanding and had no additional questions.

## 2018-03-02 ENCOUNTER — Other Ambulatory Visit: Payer: Self-pay

## 2018-03-02 ENCOUNTER — Emergency Department (HOSPITAL_COMMUNITY)
Admission: EM | Admit: 2018-03-02 | Discharge: 2018-03-02 | Disposition: A | Discharge status: Home / Self Care | Payer: Medicaid Other | Attending: Emergency Medicine | Admitting: Emergency Medicine

## 2018-03-02 ENCOUNTER — Encounter (HOSPITAL_COMMUNITY): Payer: Self-pay | Admitting: Emergency Medicine

## 2018-03-02 DIAGNOSIS — I5022 Chronic systolic (congestive) heart failure: Secondary | ICD-10-CM | POA: Diagnosis not present

## 2018-03-02 DIAGNOSIS — Z992 Dependence on renal dialysis: Secondary | ICD-10-CM | POA: Diagnosis not present

## 2018-03-02 DIAGNOSIS — K0889 Other specified disorders of teeth and supporting structures: Secondary | ICD-10-CM

## 2018-03-02 DIAGNOSIS — R000 Tachycardia, unspecified: Secondary | ICD-10-CM | POA: Diagnosis not present

## 2018-03-02 DIAGNOSIS — I132 Hypertensive heart and chronic kidney disease with heart failure and with stage 5 chronic kidney disease, or end stage renal disease: Secondary | ICD-10-CM | POA: Insufficient documentation

## 2018-03-02 DIAGNOSIS — Z79899 Other long term (current) drug therapy: Secondary | ICD-10-CM | POA: Diagnosis not present

## 2018-03-02 DIAGNOSIS — N186 End stage renal disease: Secondary | ICD-10-CM | POA: Insufficient documentation

## 2018-03-02 DIAGNOSIS — K029 Dental caries, unspecified: Secondary | ICD-10-CM | POA: Diagnosis not present

## 2018-03-02 MED ORDER — PROMETHAZINE HCL 12.5 MG PO TABS
12.5000 mg | ORAL_TABLET | Freq: Once | ORAL | Status: AC
  Administered 2018-03-02: 12.5 mg via ORAL
  Filled 2018-03-02: qty 1

## 2018-03-02 MED ORDER — HYDROCODONE-ACETAMINOPHEN 5-325 MG PO TABS
2.0000 | ORAL_TABLET | Freq: Once | ORAL | Status: AC
  Administered 2018-03-02: 2 via ORAL
  Filled 2018-03-02: qty 2

## 2018-03-02 MED ORDER — CLINDAMYCIN HCL 300 MG PO CAPS: 300.0000 mg | ORAL_CAPSULE | Freq: Three times a day (TID) | ORAL | 0 refills | Status: AC

## 2018-03-02 MED ORDER — HYDROCODONE-ACETAMINOPHEN 5-325 MG PO TABS
1.0000 | ORAL_TABLET | ORAL | 0 refills | Status: AC | PRN
Start: 2018-03-02 — End: ?

## 2018-03-02 MED ORDER — CLINDAMYCIN HCL 150 MG PO CAPS
300.0000 mg | ORAL_CAPSULE | Freq: Once | ORAL | Status: AC
  Administered 2018-03-02: 300 mg via ORAL
  Filled 2018-03-02: qty 2

## 2018-03-02 NOTE — ED Provider Notes (Signed)
[facility] EMERGENCY DEPARTMENT Provider Note   CSN: [id number] Arrival date & time: 03/02/18  1656     History   Chief Complaint Chief Complaint  Patient presents with  . Dental Pain    HPI Eric Murillo is a 57 y.o. male.  The history is provided by the patient.  Dental Pain   This is a new problem. The current episode started yesterday. The problem occurs constantly. The problem has been gradually worsening. The pain is moderate. He has tried acetaminophen for the symptoms.    Past Medical History:  Diagnosis Date  . AICD (automatic cardioverter/defibrillator) present   . Anemia of chronic disease   . Arthritis   . Atrial flutter (HCC)    DCCV 9/15  . Chest pain on exertion   . Chronic systolic congestive heart failure, NYHA class 2 (HCC)    EF 20-25% 1/16  . CKD (chronic kidney disease) stage 3, GFR 30-59 ml/min (HCC)   . Dyslipidemia   . Gout    Of big toe  . Hyperglycemia   . Hyperlipidemia   . Hypertension   . Morbid obesity with BMI of 45.0-49.9, adult (HCC)   . Non-ischemic cardiomyopathy (HCC)   . Organic erectile dysfunction   . OSA (obstructive sleep apnea)   . Pilonidal cyst   . Submandibular sialolithiasis    Right    Patient Active Problem List   Diagnosis Date Noted  . Atrial fibrillation (HCC) 11/13/2017  . ESRD (end stage renal disease) on dialysis (HCC) 11/13/2017  . 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 Long term current use of anticoagulant therapy 04/07/2015  . Health care maintenance 03/10/2015  . Osteoarthritis 06/30/2014  . Anemia of chronic disease 04/21/2014  . Gout 04/21/2014  . Atrial flutter (HCC) 03/24/2014  . Obstructive sleep apnea 12/11/2011  . Cardiomyopathy, nonischemic (HCC) 09/30/2010  . Chronic systolic heart failure (HCC) 09/15/2010  . History of cardiac arrest 04/19/2009  . Hyperlipidemia 07/04/2006  . Morbid obesity with BMI of 45.0-49.9, adult (HCC) 07/04/2006  . Hypertensive cardiovascular disease  05/30/2006    Past Surgical History:  Procedure Laterality Date  . AV FISTULA PLACEMENT Right 11/12/2017   Procedure: CREATION of RIGHT BRACHIAL CEPHALIC ARTERIOVENOUS (AV) FISTULA;  Surgeon: Sherren Kerns, MD;  Location: [facility] OR;  Service: Vascular;  Laterality: Right;  . CARDIAC CATHETERIZATION  2012  . CARDIOVERSION N/A 04/03/2014   Procedure: CARDIOVERSION;  Surgeon: Laurey Morale, MD;  Location: [facility] ENDOSCOPY;  Service: Cardiovascular;  Laterality: N/A;  . CARDIOVERSION N/A 03/09/2016   Procedure: CARDIOVERSION;  Surgeon: Laurey Morale, MD;  Location: [facility] ENDOSCOPY;  Service: Cardiovascular;  Laterality: N/A;  . CARDIOVERSION N/A 06/06/2017   Procedure: CARDIOVERSION;  Surgeon: Laurey Morale, MD;  Location: [facility] ENDOSCOPY;  Service: Cardiovascular;  Laterality: N/A;  . CARDIOVERSION N/A 11/21/2017   Procedure: CARDIOVERSION;  Surgeon: Laurey Morale, MD;  Location: [facility] ENDOSCOPY;  Service: Cardiovascular;  Laterality: N/A;  . COLONOSCOPY    . EP IMPLANTABLE DEVICE N/A 05/21/2015   Procedure: ICD Implant;  Surgeon: Duke Salvia, MD;  Location: [facility] INVASIVE CV LAB;  Service: Cardiovascular;  Laterality: N/A;  . INSERTION OF DIALYSIS CATHETER Right 11/12/2017   Procedure: PLACEMENT of right internal jugualr tunneled DIALYSIS CATHETER with removal or temporary dialysis catheter;  Surgeon: Sherren Kerns, MD;  Location: [facility] OR;  Service: Vascular;  Laterality: Right;  . IR FLUORO GUIDE CV LINE RIGHT  11/08/2017  . IR US GUIDE VASC ACCESS RIGHT  11/08/2017  .  LEFT AND RIGHT HEART CATHETERIZATION WITH CORONARY ANGIOGRAM N/A 04/24/2014   Procedure: LEFT AND RIGHT HEART CATHETERIZATION WITH CORONARY ANGIOGRAM;  Surgeon: Laurey Morale, MD;  Location: [facility] CATH LAB;  Service: Cardiovascular;  Laterality: N/A;  . SALIVARY GLAND SURGERY  09/12/2012  . SUBMANDIBULAR GLAND EXCISION Right 09/12/2012   Procedure: Removal Right Submandibular Larina Bras;  Surgeon: Serena Colonel, MD;  Location: [facility] OR;  Service:  ENT;  Laterality: Right;  . TEE WITHOUT CARDIOVERSION N/A 04/03/2014   Procedure: TRANSESOPHAGEAL ECHOCARDIOGRAM (TEE);  Surgeon: Laurey Morale, MD;  Location: [facility] ENDOSCOPY;  Service: Cardiovascular;  Laterality: N/A;  . TEE WITHOUT CARDIOVERSION N/A 03/09/2016   Procedure: TRANSESOPHAGEAL ECHOCARDIOGRAM (TEE);  Surgeon: Laurey Morale, MD;  Location: [facility] ENDOSCOPY;  Service: Cardiovascular;  Laterality: N/A;  . TEE WITHOUT CARDIOVERSION N/A 06/06/2017   Procedure: TRANSESOPHAGEAL ECHOCARDIOGRAM (TEE);  Surgeon: Laurey Morale, MD;  Location: [facility] ENDOSCOPY;  Service: Cardiovascular;  Laterality: N/A;  . TEE WITHOUT CARDIOVERSION N/A 11/21/2017   Procedure: TRANSESOPHAGEAL ECHOCARDIOGRAM (TEE);  Surgeon: Laurey Morale, MD;  Location: [facility] ENDOSCOPY;  Service: Cardiovascular;  Laterality: N/A;        Home Medications    Prior to Admission medications   Medication Sig Start Date End Date Taking? Authorizing Provider  allopurinol (ZYLOPRIM) 100 MG tablet TAKE 1 TABLET BY MOUTH TWO TIMES DAILY 02/07/18   Inez Catalina, MD  amiodarone (PACERONE) 200 MG tablet Take 1 tablet (200 mg total) by mouth 2 (two) times daily. 12/25/17   Law, Waylan Boga, PA-C  calcitRIOL (ROCALTROL) 0.5 MCG capsule Take 1 capsule (0.5 mcg total) by mouth every other day. Patient taking differently: Take 0.5 mcg by mouth Every Tuesday,Thursday,and Saturday with dialysis.  11/24/17   Alford Highland, NP  calcium acetate (PHOSLO) 667 MG capsule Take 2 capsules (1,334 mg total) by mouth 3 (three) times daily with meals. 01/23/18   Levert Feinstein, MD  carvedilol (COREG) 3.125 MG tablet Take 1 tablet (3.125 mg total) by mouth 2 (two) times daily with a meal. Give ONLY on non-HD days. Hold on dialysis days. 11/22/17   Alford Highland, NP  diclofenac sodium (VOLTAREN) 1 % GEL Apply 4 g topically 4 (four) times daily. Patient taking differently: Apply 4 g topically 4 (four) times daily as needed (for pain at affected sites).   02/07/17   Darreld Mclean, MD  ELIQUIS 5 MG TABS tablet TAKE ONE TABLET BY MOUTH TWICE DAILY 08/23/17   Darreld Mclean, MD  multivitamin (RENA-VIT) TABS tablet Take 1 tablet by mouth at bedtime. 11/22/17   Alford Highland, NP    Family History Family History  Problem Relation Age of Onset  . Hypertension Mother   . Hypertension Father   . Cancer Father        brother died of brain cancer; sister died of bone cancer  . Diabetes Sister   . Diabetes Brother   . Colon cancer Maternal Aunt   . Cardiomyopathy Neg Hx     Social History Social History   Tobacco Use  . Smoking status: Never Smoker  . Smokeless tobacco: Never Used  Substance Use Topics  . Alcohol use: No    Alcohol/week: 0.0 standard drinks  . Drug use: No     Allergies   Nsaids and Beta adrenergic blockers   Review of Systems Review of Systems  Constitutional: Negative for activity change.       All ROS Neg except as noted in HPI  HENT: Positive  for dental problem. Negative for nosebleeds.   Eyes: Negative for photophobia and discharge.  Respiratory: Negative for cough, shortness of breath and wheezing.   Cardiovascular: Negative for chest pain and palpitations.  Gastrointestinal: Negative for abdominal pain and blood in stool.  Genitourinary: Negative for dysuria, frequency and hematuria.  Musculoskeletal: Negative for arthralgias, back pain and neck pain.  Skin: Negative.   Neurological: Negative for dizziness, seizures and speech difficulty.  Psychiatric/Behavioral: Negative for confusion and hallucinations.     Physical Exam Updated Vital Signs BP 105/71 (BP Location: Left Arm)   Pulse (!) 104   Temp 97.7 F (36.5 C) (Oral)   Resp 18   Ht 5' 9.5" (1.765 m)   Wt 120.7 kg   SpO2 100%   BMI 38.72 kg/m   Physical Exam  Constitutional: He is oriented to person, place, and time. He appears well-developed and well-nourished.  Non-toxic appearance.  HENT:  Head: Normocephalic.  Right Ear: Tympanic  membrane and external ear normal.  Left Ear: Tympanic membrane and external ear normal.  There is tenderness to percussion over the upper right molars.  There is a cavity of the posterior molar noted.  There is some swelling of the gum, but no visible abscess noted.  There is no swelling under the tongue.  The airway is patent, the uvula is in the midline.  The patient can open and close his mouth.  He has no problem mobilizing secretions.  Eyes: Pupils are equal, round, and reactive to light. EOM and lids are normal.  Neck: Normal range of motion. Neck supple. Carotid bruit is not present.  Cardiovascular: Regular rhythm, normal heart sounds, intact distal pulses and normal pulses. Tachycardia present.  Pulmonary/Chest: Breath sounds normal. No respiratory distress.  Abdominal: Soft. Bowel sounds are normal. There is no tenderness. There is no guarding.  Musculoskeletal: Normal range of motion.  Lymphadenopathy:       Head (right side): No submandibular adenopathy present.       Head (left side): No submandibular adenopathy present.    He has no cervical adenopathy.  Neurological: He is alert and oriented to person, place, and time. He has normal strength. No cranial nerve deficit or sensory deficit.  Skin: Skin is warm and dry.  Psychiatric: He has a normal mood and affect. His speech is normal.  Nursing note and vitals reviewed.    ED Treatments / Results  Labs (all labs ordered are listed, but only abnormal results are displayed) Labs Reviewed - No data to display  EKG None  Radiology No results found.  Procedures Procedures (including critical care time)  Medications Ordered in ED Medications - No data to display   Initial Impression / Assessment and Plan / ED Course  I have reviewed the triage vital signs and the nursing notes.  Pertinent labs & imaging results that were available during my care of the patient were reviewed by me and considered in my medical decision  making (see chart for details).       Final Clinical Impressions(s) / ED Diagnoses MDM  Vital signs within normal limits.  Patient had a cavity of the posterior molar on the right.  There is no evidence for Ludewig's angina or other acute changes.  The patient will be placed on clindamycin and Norco for pain.  I have asked the patient to see the dentist as soon as he can on next week.  The patient is in agreement with this plan.   Final diagnoses:  Dental caries  Pain, dental    ED Discharge Orders         Ordered    clindamycin (CLEOCIN) 300 MG capsule  3 times daily     03/02/18 1839    HYDROcodone-acetaminophen (NORCO/VICODIN) 5-325 MG tablet  Every 4 hours PRN     03/02/18 1839           Ivery Quale, PA-C 03/02/18 1843    Mancel Bale, MD 03/02/18 2010

## 2018-03-02 NOTE — Discharge Instructions (Addendum)
It is important that you see your dentist as soon as possible next week for resolution of this problem.  Please use clindamycin 3 times daily with a meal.  Please use Tylenol extra strength for mild pain, use Norco every 4-6 hours for more severe pain. This medication may cause drowsiness. Please do not drink, drive, or participate in activity that requires concentration while taking this medication.

## 2018-03-02 NOTE — ED Notes (Signed)
Pt left with wife driving 

## 2018-03-02 NOTE — ED Triage Notes (Signed)
Patient c/o right upper dental pain x3 days that is progressively gotten worse. Denies any fevers. Patient reports taking tylenol with no relief.

## 2018-03-06 ENCOUNTER — Telehealth: Payer: Self-pay

## 2018-03-06 NOTE — Telephone Encounter (Signed)
Requesting to speak with a nurse about something. Please call pt back.  °

## 2018-03-06 NOTE — Telephone Encounter (Signed)
Dr crisps office will be faxing medical clearance forms for md to complete

## 2018-03-06 NOTE — Telephone Encounter (Signed)
Noted. Thanks.

## 2018-03-07 ENCOUNTER — Telehealth (HOSPITAL_COMMUNITY): Payer: Self-pay

## 2018-03-07 NOTE — Telephone Encounter (Signed)
Pt called stating that he has to have a tooth pulled. Dr. Delanna Notice from [facility] is performing the procedure. Pt would like to know if he should stop Eliquis for 3 days prior to going to dentist and if there is anything else he needs to do. Please advise.

## 2018-03-07 NOTE — Telephone Encounter (Signed)
Discussed with Dr. Shirlee Latch.    OK to hold eliquis leading up to procedure.    Please note that I am the inpatient provider, please send telephone calls to the outpatient provider, Morrie Sheldon this week.     Casimiro Needle [address]" Hitchcock, PA-C 03/07/2018 1:57 PM

## 2018-03-07 NOTE — Telephone Encounter (Signed)
Noted. Pt notified.  

## 2018-03-08 ENCOUNTER — Other Ambulatory Visit (HOSPITAL_COMMUNITY)
Admission: AD | Admit: 2018-03-08 | Discharge: 2018-03-08 | Disposition: A | Discharge status: Home / Self Care | Payer: Medicaid Other | Source: Other Acute Inpatient Hospital | Attending: Internal Medicine | Admitting: Internal Medicine

## 2018-03-08 ENCOUNTER — Telehealth: Payer: Self-pay

## 2018-03-08 DIAGNOSIS — I472 Ventricular tachycardia, unspecified: Secondary | ICD-10-CM

## 2018-03-08 LAB — BASIC METABOLIC PANEL
Anion gap: 11 (ref 5–15)
BUN: 56 mg/dL — AB (ref 6–20)
CALCIUM: 8.9 mg/dL (ref 8.9–10.3)
CO2: 26 mmol/L (ref 22–32)
Chloride: 98 mmol/L (ref 98–111)
Creatinine, Ser: 7.93 mg/dL — ABNORMAL HIGH (ref 0.61–1.24)
GFR calc Af Amer: 8 mL/min — ABNORMAL LOW (ref >60)
GFR, EST NON AFRICAN AMERICAN: 7 mL/min — AB (ref >60)
GLUCOSE: 97 mg/dL (ref 70–99)
Potassium: 3.8 mmol/L (ref 3.5–5.1)
Sodium: 135 mmol/L (ref 135–145)

## 2018-03-08 LAB — MAGNESIUM: MAGNESIUM: 2.8 mg/dL — AB (ref 1.7–2.4)

## 2018-03-08 NOTE — Telephone Encounter (Signed)
Pt called feeling dizzy and pt girlfriend states that he was shaking. Pt vomited because of antibiotic got stuck in his throat. Pt does not feel any shortness of breath. Pt states he has taking all his medication today.

## 2018-03-08 NOTE — Telephone Encounter (Signed)
Remote transmission reviewed. Presenting rhythm: Vs 90's. (1) VF episode from 1454 today (255bpm x 22sec) treated successfully with 30J shock.   Transmission reviewed with Dr.Taylor who recommended that patient get BMP/Mag and f/u with Dr.Klein in 2-3 weeks.  Called to inform patient about Dr.Taylor's recommendation. Patient states that he's currently in [place] his girlfriend. Instructed patient to have labs done at [facility]. Patient verbalized understanding.  Informed patient about DMV driving restriction x 6 months. Again, patient verbalized understanding.

## 2018-03-12 ENCOUNTER — Telehealth: Payer: Self-pay | Admitting: *Deleted

## 2018-03-12 NOTE — Telephone Encounter (Signed)
Pt calls and states he has not been able to have his dental procedure done because dr crisps office does not have medical clearance from pt's pcp. Spoke to front office, they state they do not have the paperwork.  Called dr crisps office, they will refax paperwork for dr prince to complete Ph# dr crisps [phone]

## 2018-03-13 ENCOUNTER — Other Ambulatory Visit: Payer: Self-pay | Admitting: Oncology

## 2018-03-13 MED ORDER — APIXABAN 2.5 MG PO TABS: 2.5000 mg | ORAL_TABLET | Freq: Two times a day (BID) | ORAL | 1 refills | Status: AC

## 2018-03-14 ENCOUNTER — Other Ambulatory Visit: Payer: Self-pay | Admitting: Oncology

## 2018-03-15 ENCOUNTER — Other Ambulatory Visit: Payer: Self-pay | Admitting: Internal Medicine

## 2018-03-18 NOTE — Progress Notes (Signed)
Electrophysiology Office Note Date: 03/20/2018  ID:  Eric Murillo, DOB 13-May-1961, MRN [id number]  PCP: Synetta Shadow, MD  Cardiologist: Shirlee Latch  Electrophysiologist: Graciela Husbands  CC: Routine ICD follow-up  Eric Murillo is a 57 y.o. male seen today for Dr Graciela Husbands.  He presents today for routine electrophysiology followup.  Since last being seen in our clinic, the patient reports doing very well. He denies chest pain, palpitations, dyspnea, PND, orthopnea, nausea, vomiting, dizziness, syncope, edema, weight gain, or early satiety.  He has had ICD shocks identified by recent remote interrogation.  He had an episode of VF on 03/08/18 that was terminated with HV shock. He reports compliance with amiodarone and HD. He had dizziness preceding episode.   Device History: STJ single chamber ICD implanted 2016 for NICM History of appropriate therapy: Yes History of AAD therapy: Yes - amiodarone   Past Medical History:  Diagnosis Date  . AICD (automatic cardioverter/defibrillator) present   . Anemia of chronic disease   . Arthritis   . Atrial flutter (HCC)    DCCV 9/15  . Chest pain on exertion   . Chronic systolic congestive heart failure, NYHA class 2 (HCC)    EF 20-25% 1/16  . CKD (chronic kidney disease) stage 3, GFR 30-59 ml/min (HCC)   . Dyslipidemia   . Gout    Of big toe  . Hyperglycemia   . Hyperlipidemia   . Hypertension   . Morbid obesity with BMI of 45.0-49.9, adult (HCC)   . Non-ischemic cardiomyopathy (HCC)   . Organic erectile dysfunction   . OSA (obstructive sleep apnea)   . Pilonidal cyst   . Submandibular sialolithiasis    Right   Past Surgical History:  Procedure Laterality Date  . AV FISTULA PLACEMENT Right 11/12/2017   Procedure: CREATION of RIGHT BRACHIAL CEPHALIC ARTERIOVENOUS (AV) FISTULA;  Surgeon: Sherren Kerns, MD;  Location: [facility] OR;  Service: Vascular;  Laterality: Right;  . CARDIAC CATHETERIZATION  2012  . CARDIOVERSION N/A 04/03/2014   Procedure:  CARDIOVERSION;  Surgeon: Laurey Morale, MD;  Location: [facility] ENDOSCOPY;  Service: Cardiovascular;  Laterality: N/A;  . CARDIOVERSION N/A 03/09/2016   Procedure: CARDIOVERSION;  Surgeon: Laurey Morale, MD;  Location: [facility] ENDOSCOPY;  Service: Cardiovascular;  Laterality: N/A;  . CARDIOVERSION N/A 06/06/2017   Procedure: CARDIOVERSION;  Surgeon: Laurey Morale, MD;  Location: [facility] ENDOSCOPY;  Service: Cardiovascular;  Laterality: N/A;  . CARDIOVERSION N/A 11/21/2017   Procedure: CARDIOVERSION;  Surgeon: Laurey Morale, MD;  Location: [facility] ENDOSCOPY;  Service: Cardiovascular;  Laterality: N/A;  . COLONOSCOPY    . EP IMPLANTABLE DEVICE N/A 05/21/2015   Procedure: ICD Implant;  Surgeon: Duke Salvia, MD;  Location: [facility] INVASIVE CV LAB;  Service: Cardiovascular;  Laterality: N/A;  . INSERTION OF DIALYSIS CATHETER Right 11/12/2017   Procedure: PLACEMENT of right internal jugualr tunneled DIALYSIS CATHETER with removal or temporary dialysis catheter;  Surgeon: Sherren Kerns, MD;  Location: [facility] OR;  Service: Vascular;  Laterality: Right;  . IR FLUORO GUIDE CV LINE RIGHT  11/08/2017  . IR US GUIDE VASC ACCESS RIGHT  11/08/2017  . LEFT AND RIGHT HEART CATHETERIZATION WITH CORONARY ANGIOGRAM N/A 04/24/2014   Procedure: LEFT AND RIGHT HEART CATHETERIZATION WITH CORONARY ANGIOGRAM;  Surgeon: Laurey Morale, MD;  Location: [facility] CATH LAB;  Service: Cardiovascular;  Laterality: N/A;  . SALIVARY GLAND SURGERY  09/12/2012  . SUBMANDIBULAR GLAND EXCISION Right 09/12/2012   Procedure: Removal Right Submandibular Stone;  Surgeon: Serena Colonel,  MD;  Location: [facility] OR;  Service: ENT;  Laterality: Right;  . TEE WITHOUT CARDIOVERSION N/A 04/03/2014   Procedure: TRANSESOPHAGEAL ECHOCARDIOGRAM (TEE);  Surgeon: Laurey Morale, MD;  Location: [facility] ENDOSCOPY;  Service: Cardiovascular;  Laterality: N/A;  . TEE WITHOUT CARDIOVERSION N/A 03/09/2016   Procedure: TRANSESOPHAGEAL ECHOCARDIOGRAM (TEE);  Surgeon: Laurey Morale, MD;  Location:  [facility] ENDOSCOPY;  Service: Cardiovascular;  Laterality: N/A;  . TEE WITHOUT CARDIOVERSION N/A 06/06/2017   Procedure: TRANSESOPHAGEAL ECHOCARDIOGRAM (TEE);  Surgeon: Laurey Morale, MD;  Location: [facility] ENDOSCOPY;  Service: Cardiovascular;  Laterality: N/A;  . TEE WITHOUT CARDIOVERSION N/A 11/21/2017   Procedure: TRANSESOPHAGEAL ECHOCARDIOGRAM (TEE);  Surgeon: Laurey Morale, MD;  Location: [facility] ENDOSCOPY;  Service: Cardiovascular;  Laterality: N/A;    Current Outpatient Medications  Medication Sig Dispense Refill  . allopurinol (ZYLOPRIM) 100 MG tablet TAKE 1 TABLET BY MOUTH TWO TIMES DAILY 180 tablet 1  . amiodarone (PACERONE) 200 MG tablet Take 1 tablet (200 mg total) by mouth 2 (two) times daily. 60 tablet 0  . apixaban (ELIQUIS) 2.5 MG TABS tablet Take 1 tablet (2.5 mg total) by mouth 2 (two) times daily. 60 tablet 1  . calcitRIOL (ROCALTROL) 0.5 MCG capsule Take 1 capsule (0.5 mcg total) by mouth every other day. (Patient taking differently: Take 0.5 mcg by mouth Every Tuesday,Thursday,and Saturday with dialysis. ) 15 capsule 0  . calcium acetate (PHOSLO) 667 MG capsule TAKE 2 CAPSULES (1,334 MG TOTAL) BY MOUTH 3 (THREE) TIMES DAILY WITH MEALS. 180 capsule 0  . carvedilol (COREG) 3.125 MG tablet Take 1 tablet (3.125 mg total) by mouth 2 (two) times daily with a meal. Give ONLY on non-HD days. Hold on dialysis days. 40 tablet 6  . clindamycin (CLEOCIN) 300 MG capsule Take 1 capsule (300 mg total) by mouth 3 (three) times daily. 21 capsule 0  . diclofenac sodium (VOLTAREN) 1 % GEL Apply 4 g topically 4 (four) times daily. (Patient taking differently: Apply 4 g topically 4 (four) times daily as needed (for pain at affected sites). ) 1 Tube 2  . HYDROcodone-acetaminophen (NORCO/VICODIN) 5-325 MG tablet Take 1-2 tablets by mouth every 4 (four) hours as needed. 15 tablet 0  . multivitamin (RENA-VIT) TABS tablet Take 1 tablet by mouth at bedtime. 30 tablet 6   No current facility-administered  medications for this visit.     Allergies:   Nsaids and Beta adrenergic blockers   Social History: Social History   Socioeconomic History  . Marital status: Single    Spouse name: Not on file  . Number of children: Not on file  . Years of education: Not on file  . Highest education level: Not on file  Occupational History  . Not on file  Social Needs  . Financial resource strain: Not on file  . Food insecurity:    Worry: Not on file    Inability: Not on file  . Transportation needs:    Medical: Not on file    Non-medical: Not on file  Tobacco Use  . Smoking status: Never Smoker  . Smokeless tobacco: Never Used  Substance and Sexual Activity  . Alcohol use: No    Alcohol/week: 0.0 standard drinks  . Drug use: No  . Sexual activity: Not on file  Lifestyle  . Physical activity:    Days per week: Not on file    Minutes per session: Not on file  . Stress: Not on file  Relationships  . Social connections:  Talks on phone: Not on file    Gets together: Not on file    Attends religious service: Not on file    Active member of club or organization: Not on file    Attends meetings of clubs or organizations: Not on file    Relationship status: Not on file  . Intimate partner violence:    Fear of current or ex partner: Not on file    Emotionally abused: Not on file    Physically abused: Not on file    Forced sexual activity: Not on file  Other Topics Concern  . Not on file  Social History Narrative   Financial assistance approved for 100% discount at [facility] and has [facility] card   [employer]  March 16, 2010 9:42 AM      Lives with mother.  Has a girlfriend    Family History: Family History  Problem Relation Age of Onset  . Hypertension Mother   . Hypertension Father   . Cancer Father        brother died of brain cancer; sister died of bone cancer  . Diabetes Sister   . Diabetes Brother   . Colon cancer Maternal Aunt   . Cardiomyopathy Neg Hx     Review of  Systems: All other systems reviewed and are otherwise negative except as noted above.   Physical Exam: VS:  BP 104/70   Pulse (!) 101   Ht 5' 9.5" (1.765 m)   Wt 278 lb 3.2 oz (126.2 kg)   SpO2 98%   BMI 40.49 kg/m  , BMI Body mass index is 40.49 kg/m.  GEN- The patient is well appearing, alert and oriented x 3 today.   HEENT: normocephalic, atraumatic; sclera clear, conjunctiva pink; hearing intact; oropharynx clear; neck supple  Lungs- Clear to ausculation bilaterally, normal work of breathing.  No wheezes, rales, rhonchi Heart- Regular rate and rhythm  GI- soft, non-tender, non-distended, bowel sounds present  Extremities- no clubbing, cyanosis, or edema  MS- no significant deformity or atrophy Skin- warm and dry, no rash or lesion; ICD pocket well healed Psych- euthymic mood, full affect Neuro- strength and sensation are intact  ICD interrogation- reviewed in detail today,  See PACEART report  EKG:  EKG is not ordered today.  Recent Labs: 11/13/2017: B Natriuretic Peptide 1,085.3 12/03/2017: TSH 0.875 12/13/2017: ALT 41 01/01/2018: Hemoglobin 12.4; Platelets 303 03/08/2018: BUN 56; Creatinine, Ser 7.93; Magnesium 2.8; Potassium 3.8; Sodium 135   Wt Readings from Last 3 Encounters:  03/20/18 278 lb 3.2 oz (126.2 kg)  03/02/18 266 lb (120.7 kg)  01/16/18 267 lb (121.1 kg)     Other studies Reviewed: Additional studies/ records that were reviewed today include: Dr Odessa Fleming office notes   Assessment and Plan:  1.  Chronic systolic dysfunction euvolemic today Stable on an appropriate medical regimen Normal ICD function See Pace Art report No changes today  2.  VF Will increase amiodarone to 400mg  twice daily for 2 weeks then decrease back to 200mg  twice daily Labs being followed at HD  Keep K>3.9, Mg >1.8 No driving x6 months  3.  ESRD on HD Per nephrology  4.  Paroxysmal atrial flutter/fibrillation Continue Eliquis for CHADS2VASC of 2   Current medicines  are reviewed at length with the patient today.   The patient does not have concerns regarding his medicines.  The following changes were made today:  Increase amiodarone to 400mg  twice daily for 2 weeks then decrease back to 200mg  twice  daily  Labs/ tests ordered today include:   No orders of the defined types were placed in this encounter.    Disposition:   Follow up with Dr Graciela Husbands in 6 weeks, Dr Shirlee Latch next week as scheduled    Signed, Gypsy Balsam, NP 03/20/2018 12:50 PM  [facility] [address] [place] [address] [phone] (office) [phone] (fax)

## 2018-03-19 ENCOUNTER — Telehealth (HOSPITAL_COMMUNITY): Payer: Self-pay | Admitting: *Deleted

## 2018-03-19 ENCOUNTER — Encounter (HOSPITAL_COMMUNITY): Payer: Medicaid Other | Admitting: Cardiology

## 2018-03-19 NOTE — Telephone Encounter (Signed)
Received [facility] from [facility] for patient to hold Eliquis 2-3 days prior to teeth extraction.  Dr. Shirlee Latch has cleared patient and clearance faxed today to [phone].

## 2018-03-20 ENCOUNTER — Encounter: Payer: Medicaid Other | Admitting: Internal Medicine

## 2018-03-20 ENCOUNTER — Encounter: Payer: Self-pay | Admitting: Nurse Practitioner

## 2018-03-20 ENCOUNTER — Ambulatory Visit (INDEPENDENT_AMBULATORY_CARE_PROVIDER_SITE_OTHER): Payer: Medicaid Other | Admitting: Nurse Practitioner

## 2018-03-20 VITALS — BP 104/70 | HR 101 | Ht 69.5 in | Wt 278.2 lb

## 2018-03-20 DIAGNOSIS — N186 End stage renal disease: Secondary | ICD-10-CM | POA: Diagnosis not present

## 2018-03-20 DIAGNOSIS — I480 Paroxysmal atrial fibrillation: Secondary | ICD-10-CM

## 2018-03-20 DIAGNOSIS — I5022 Chronic systolic (congestive) heart failure: Secondary | ICD-10-CM

## 2018-03-20 DIAGNOSIS — Z992 Dependence on renal dialysis: Secondary | ICD-10-CM

## 2018-03-20 DIAGNOSIS — I472 Ventricular tachycardia, unspecified: Secondary | ICD-10-CM

## 2018-03-20 LAB — CUP PACEART INCLINIC DEVICE CHECK
Date Time Interrogation Session: 20190828134808
Implantable Lead Location: 753860
Implantable Lead Model: 181
Implantable Lead Serial Number: 332395
MDC IDC LEAD IMPLANT DT: 20161028
MDC IDC PG IMPLANT DT: 20161028
MDC IDC PG SERIAL: 7305918

## 2018-03-20 NOTE — Patient Instructions (Addendum)
Medication Instructions:   TAKE  AMIODARONE 400 MG TWICE A DAY FOR TWO WEEKS  RESUME BACK TO 200 MG TWICE A DAY   If you need a refill on your cardiac medications before your next appointment, please call your pharmacy.  Labwork: NONE ORDERED  TODAY    Testing/Procedures: NONE ORDERED  TODAY    Follow-Up: 6 WEEKS WITH  DR Graciela Husbands     Remote monitoring is used to monitor your Pacemaker of ICD from home. This monitoring reduces the number of office visits required to check your device to one time per year. It allows [place] to keep an eye on the functioning of your device to ensure it is working properly. You are scheduled for a device check from home on .  21-Apr-2018 You may send your transmission at any time that day. If you have a wireless device, the transmission will be sent automatically. After your physician reviews your transmission, you will receive a postcard with your next transmission date. ]   Any Other Special Instructions Will Be Listed Below (If Applicable).

## 2018-03-26 ENCOUNTER — Encounter (HOSPITAL_COMMUNITY): Payer: Self-pay | Admitting: *Deleted

## 2018-03-26 ENCOUNTER — Emergency Department (HOSPITAL_COMMUNITY)
Admission: EM | Admit: 2018-03-26 | Discharge: 2018-03-26 | Disposition: A | Discharge status: Home / Self Care | Payer: Medicaid Other | Attending: Emergency Medicine | Admitting: Emergency Medicine

## 2018-03-26 ENCOUNTER — Telehealth (HOSPITAL_COMMUNITY): Payer: Self-pay | Admitting: *Deleted

## 2018-03-26 ENCOUNTER — Emergency Department (HOSPITAL_COMMUNITY): Payer: Medicaid Other

## 2018-03-26 ENCOUNTER — Other Ambulatory Visit: Payer: Self-pay

## 2018-03-26 DIAGNOSIS — I132 Hypertensive heart and chronic kidney disease with heart failure and with stage 5 chronic kidney disease, or end stage renal disease: Secondary | ICD-10-CM | POA: Insufficient documentation

## 2018-03-26 DIAGNOSIS — Z79899 Other long term (current) drug therapy: Secondary | ICD-10-CM | POA: Insufficient documentation

## 2018-03-26 DIAGNOSIS — I5022 Chronic systolic (congestive) heart failure: Secondary | ICD-10-CM | POA: Diagnosis not present

## 2018-03-26 DIAGNOSIS — N186 End stage renal disease: Secondary | ICD-10-CM | POA: Insufficient documentation

## 2018-03-26 DIAGNOSIS — R531 Weakness: Secondary | ICD-10-CM | POA: Diagnosis present

## 2018-03-26 DIAGNOSIS — I4892 Unspecified atrial flutter: Secondary | ICD-10-CM | POA: Diagnosis not present

## 2018-03-26 DIAGNOSIS — I483 Typical atrial flutter: Secondary | ICD-10-CM

## 2018-03-26 LAB — CBC WITH DIFFERENTIAL/PLATELET
ABS IMMATURE GRANULOCYTES: 0.1 10*3/uL (ref 0.0–0.1)
BASOS ABS: 0.1 10*3/uL (ref 0.0–0.1)
Basophils Relative: 1 %
EOS PCT: 1 %
Eosinophils Absolute: 0.2 10*3/uL (ref 0.0–0.7)
HCT: 41.3 % (ref 39.0–52.0)
HEMOGLOBIN: 12.5 g/dL — AB (ref 13.0–17.0)
Immature Granulocytes: 1 %
LYMPHS PCT: 18 %
Lymphs Abs: 2.1 10*3/uL (ref 0.7–4.0)
MCH: 29.4 pg (ref 26.0–34.0)
MCHC: 30.3 g/dL (ref 30.0–36.0)
MCV: 97.2 fL (ref 78.0–100.0)
MONO ABS: 1.2 10*3/uL — AB (ref 0.1–1.0)
Monocytes Relative: 10 %
NEUTROS ABS: 7.9 10*3/uL — AB (ref 1.7–7.7)
Neutrophils Relative %: 69 %
Platelets: 212 10*3/uL (ref 150–400)
RBC: 4.25 MIL/uL (ref 4.22–5.81)
RDW: 14.4 % (ref 11.5–15.5)
WBC: 11.4 10*3/uL — AB (ref 4.0–10.5)

## 2018-03-26 LAB — I-STAT CHEM 8, ED
BUN: 66 mg/dL — ABNORMAL HIGH (ref 6–20)
CALCIUM ION: 1.1 mmol/L — AB (ref 1.15–1.40)
Chloride: 101 mmol/L (ref 98–111)
Creatinine, Ser: 9.3 mg/dL — ABNORMAL HIGH (ref 0.61–1.24)
GLUCOSE: 172 mg/dL — AB (ref 70–99)
HCT: 41 % (ref 39.0–52.0)
HEMOGLOBIN: 13.9 g/dL (ref 13.0–17.0)
POTASSIUM: 4.2 mmol/L (ref 3.5–5.1)
Sodium: 135 mmol/L (ref 135–145)
TCO2: 22 mmol/L (ref 22–32)

## 2018-03-26 LAB — COMPREHENSIVE METABOLIC PANEL
ALBUMIN: 3.7 g/dL (ref 3.5–5.0)
ALT: 32 U/L (ref 0–44)
ANION GAP: 16 — AB (ref 5–15)
AST: 34 U/L (ref 15–41)
Alkaline Phosphatase: 110 U/L (ref 38–126)
BUN: 67 mg/dL — AB (ref 6–20)
CHLORIDE: 98 mmol/L (ref 98–111)
CO2: 22 mmol/L (ref 22–32)
Calcium: 9.4 mg/dL (ref 8.9–10.3)
Creatinine, Ser: 8.51 mg/dL — ABNORMAL HIGH (ref 0.61–1.24)
GFR calc Af Amer: 7 mL/min — ABNORMAL LOW (ref >60)
GFR calc non Af Amer: 6 mL/min — ABNORMAL LOW (ref >60)
GLUCOSE: 177 mg/dL — AB (ref 70–99)
POTASSIUM: 4.3 mmol/L (ref 3.5–5.1)
SODIUM: 136 mmol/L (ref 135–145)
TOTAL PROTEIN: 7.4 g/dL (ref 6.5–8.1)
Total Bilirubin: 1.2 mg/dL (ref 0.3–1.2)

## 2018-03-26 LAB — LIPID PANEL
CHOL/HDL RATIO: 4.5 ratio
Cholesterol: 166 mg/dL (ref 0–200)
HDL: 37 mg/dL — AB (ref >40)
LDL CALC: 101 mg/dL — AB (ref 0–99)
Triglycerides: 138 mg/dL (ref <150)
VLDL: 28 mg/dL (ref 0–40)

## 2018-03-26 LAB — APTT: aPTT: 34 seconds (ref 24–36)

## 2018-03-26 LAB — I-STAT TROPONIN, ED: TROPONIN I, POC: 0.07 ng/mL (ref 0.00–0.08)

## 2018-03-26 LAB — PROTIME-INR
INR: 1.61
PROTHROMBIN TIME: 19 s — AB (ref 11.4–15.2)

## 2018-03-26 MED ORDER — SODIUM CHLORIDE 0.9 % IV SOLN: INTRAVENOUS | Status: DC

## 2018-03-26 MED ORDER — ETOMIDATE 2 MG/ML IV SOLN
10.0000 mg | Freq: Once | INTRAVENOUS | Status: AC
  Administered 2018-03-26: 10 mg via INTRAVENOUS
  Filled 2018-03-26: qty 10

## 2018-03-26 NOTE — Progress Notes (Signed)
RT at bedside to cardiovert pt at 0545.  Pt shocked once and cardioverted to Normal sinus rhythm.  SPo2 dropped in to the upper 70's.  Pt placed on NRB. SATS increased to 100%.  Will continue to monitor.

## 2018-03-26 NOTE — ED Notes (Signed)
Contacted [employer] rep to check on interrogation report

## 2018-03-26 NOTE — ED Notes (Signed)
Spoke to Vernona Rieger at [facility] in [place], pt will be able to have dialysis this morning; will be discharged from [facility] to go straight to dialysis

## 2018-03-26 NOTE — Telephone Encounter (Signed)
Pt dccv in ED 03/26/18--Cld pt in follow up to sched with Afib clinic. Per pt s/o pt has appt with DM in heart failure tomorrow.  I advised that is scheduled for 10/4 and she was given their number to confirm whether or not a new appt had been made.

## 2018-03-26 NOTE — ED Notes (Signed)
Pt awaiting ride, will discharge once girlfriend arrives.

## 2018-03-26 NOTE — ED Triage Notes (Addendum)
Pt was watching TV tonight started having some sob that has since resolved with weakness. Pt is a Tues, Thurs , [id number] dialysis patient, last treatment on Saturday. Pt only c/o weakness at present

## 2018-03-26 NOTE — ED Notes (Signed)
[employer] Rep enroute to check on interrogation

## 2018-03-26 NOTE — ED Provider Notes (Signed)
[facility] EMERGENCY DEPARTMENT Provider Note   CSN: [id number] Arrival date & time: 03/26/18  0505     History   Chief Complaint Chief Complaint  Patient presents with  . Code STEMI  . Weakness    HPI Eric Murillo is a 57 y.o. male.  HPI  This is a 57 year old male with a history of end-stage renal disease on dialysis Tuesday, Thursday, Saturday, atrial flutter, congestive heart failure who presents by EMS as a STEMI.  Patient called EMS after having some shortness of breath and generalized weakness.  By EMS he was noted to be diaphoretic.  EKG showed QRS widening and some elevations.  Patient denies any chest pain.  He states he just feels very weak.  He last dialyzed a full dialysis session on Saturday.  Denies palpitations.  Denies leg swelling.  Reports compliance with medication including Eliquis.  He states he is no longer short of breath.  Denies any recent fevers or cough.  Cardiology fellow is at the bedside.  EKG is reviewed.  Patient appears to be in atrial flutter.  Code STEMI canceled.  Past Medical History:  Diagnosis Date  . AICD (automatic cardioverter/defibrillator) present   . Anemia of chronic disease   . Arthritis   . Atrial flutter (HCC)    DCCV 9/15  . Chest pain on exertion   . Chronic systolic congestive heart failure, NYHA class 2 (HCC)    EF 20-25% 1/16  . CKD (chronic kidney disease) stage 3, GFR 30-59 ml/min (HCC)   . Dyslipidemia   . Gout    Of big toe  . Hyperglycemia   . Hyperlipidemia   . Hypertension   . Morbid obesity with BMI of 45.0-49.9, adult (HCC)   . Non-ischemic cardiomyopathy (HCC)   . Organic erectile dysfunction   . OSA (obstructive sleep apnea)   . Pilonidal cyst   . Submandibular sialolithiasis    Right    Patient Active Problem List   Diagnosis Date Noted  . Atrial fibrillation (HCC) 11/13/2017  . ESRD (end stage renal disease) on dialysis (HCC) 11/13/2017  . 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 Long term current use of anticoagulant therapy 04/07/2015  . Health care maintenance 03/10/2015  . Osteoarthritis 06/30/2014  . Anemia of chronic disease 04/21/2014  . Gout 04/21/2014  . Atrial flutter (HCC) 03/24/2014  . Obstructive sleep apnea 12/11/2011  . Cardiomyopathy, nonischemic (HCC) 09/30/2010  . Chronic systolic heart failure (HCC) 09/15/2010  . History of cardiac arrest 04/19/2009  . Hyperlipidemia 07/04/2006  . Morbid obesity with BMI of 45.0-49.9, adult (HCC) 07/04/2006  . Hypertensive cardiovascular disease 05/30/2006    Past Surgical History:  Procedure Laterality Date  . AV FISTULA PLACEMENT Right 11/12/2017   Procedure: CREATION of RIGHT BRACHIAL CEPHALIC ARTERIOVENOUS (AV) FISTULA;  Surgeon: Sherren Kerns, MD;  Location: [facility] OR;  Service: Vascular;  Laterality: Right;  . CARDIAC CATHETERIZATION  2012  . CARDIOVERSION N/A 04/03/2014   Procedure: CARDIOVERSION;  Surgeon: Laurey Morale, MD;  Location: [facility] ENDOSCOPY;  Service: Cardiovascular;  Laterality: N/A;  . CARDIOVERSION N/A 03/09/2016   Procedure: CARDIOVERSION;  Surgeon: Laurey Morale, MD;  Location: [facility] ENDOSCOPY;  Service: Cardiovascular;  Laterality: N/A;  . CARDIOVERSION N/A 06/06/2017   Procedure: CARDIOVERSION;  Surgeon: Laurey Morale, MD;  Location: [facility] ENDOSCOPY;  Service: Cardiovascular;  Laterality: N/A;  . CARDIOVERSION N/A 11/21/2017   Procedure: CARDIOVERSION;  Surgeon: Laurey Morale, MD;  Location: [facility] ENDOSCOPY;  Service: Cardiovascular;  Laterality: N/A;  . COLONOSCOPY    . EP IMPLANTABLE DEVICE N/A 05/21/2015   Procedure: ICD Implant;  Surgeon: Duke Salvia, MD;  Location: [facility] INVASIVE CV LAB;  Service: Cardiovascular;  Laterality: N/A;  . INSERTION OF DIALYSIS CATHETER Right 11/12/2017   Procedure: PLACEMENT of right internal jugualr tunneled DIALYSIS CATHETER with removal or temporary dialysis catheter;  Surgeon: Sherren Kerns, MD;  Location: [facility] OR;  Service: Vascular;  Laterality:  Right;  . IR FLUORO GUIDE CV LINE RIGHT  11/08/2017  . IR US GUIDE VASC ACCESS RIGHT  11/08/2017  . LEFT AND RIGHT HEART CATHETERIZATION WITH CORONARY ANGIOGRAM N/A 04/24/2014   Procedure: LEFT AND RIGHT HEART CATHETERIZATION WITH CORONARY ANGIOGRAM;  Surgeon: Laurey Morale, MD;  Location: [facility] CATH LAB;  Service: Cardiovascular;  Laterality: N/A;  . SALIVARY GLAND SURGERY  09/12/2012  . SUBMANDIBULAR GLAND EXCISION Right 09/12/2012   Procedure: Removal Right Submandibular Larina Bras;  Surgeon: Serena Colonel, MD;  Location: [facility] OR;  Service: ENT;  Laterality: Right;  . TEE WITHOUT CARDIOVERSION N/A 04/03/2014   Procedure: TRANSESOPHAGEAL ECHOCARDIOGRAM (TEE);  Surgeon: Laurey Morale, MD;  Location: [facility] ENDOSCOPY;  Service: Cardiovascular;  Laterality: N/A;  . TEE WITHOUT CARDIOVERSION N/A 03/09/2016   Procedure: TRANSESOPHAGEAL ECHOCARDIOGRAM (TEE);  Surgeon: Laurey Morale, MD;  Location: [facility] ENDOSCOPY;  Service: Cardiovascular;  Laterality: N/A;  . TEE WITHOUT CARDIOVERSION N/A 06/06/2017   Procedure: TRANSESOPHAGEAL ECHOCARDIOGRAM (TEE);  Surgeon: Laurey Morale, MD;  Location: [facility] ENDOSCOPY;  Service: Cardiovascular;  Laterality: N/A;  . TEE WITHOUT CARDIOVERSION N/A 11/21/2017   Procedure: TRANSESOPHAGEAL ECHOCARDIOGRAM (TEE);  Surgeon: Laurey Morale, MD;  Location: [facility] ENDOSCOPY;  Service: Cardiovascular;  Laterality: N/A;        Home Medications    Prior to Admission medications   Medication Sig Start Date End Date Taking? Authorizing Provider  allopurinol (ZYLOPRIM) 100 MG tablet TAKE 1 TABLET BY MOUTH TWO TIMES DAILY 02/07/18   Inez Catalina, MD  amiodarone (PACERONE) 200 MG tablet Take 1 tablet (200 mg total) by mouth 2 (two) times daily. 12/25/17   Law, Waylan Boga, PA-C  apixaban (ELIQUIS) 2.5 MG TABS tablet Take 1 tablet (2.5 mg total) by mouth 2 (two) times daily. 03/13/18   Levert Feinstein, MD  calcitRIOL (ROCALTROL) 0.5 MCG capsule Take 1 capsule (0.5 mcg total) by mouth every other  day. Patient taking differently: Take 0.5 mcg by mouth Every Tuesday,Thursday,and Saturday with dialysis.  11/24/17   Alford Highland, NP  calcium acetate (PHOSLO) 667 MG capsule TAKE 2 CAPSULES (1,334 MG TOTAL) BY MOUTH 3 (THREE) TIMES DAILY WITH MEALS. 03/14/18   Levert Feinstein, MD  carvedilol (COREG) 3.125 MG tablet Take 1 tablet (3.125 mg total) by mouth 2 (two) times daily with a meal. Give ONLY on non-HD days. Hold on dialysis days. 11/22/17   Alford Highland, NP  clindamycin (CLEOCIN) 300 MG capsule Take 1 capsule (300 mg total) by mouth 3 (three) times daily. 03/02/18   Ivery Quale, PA-C  diclofenac sodium (VOLTAREN) 1 % GEL Apply 4 g topically 4 (four) times daily. Patient taking differently: Apply 4 g topically 4 (four) times daily as needed (for pain at affected sites).  02/07/17   Darreld Mclean, MD  HYDROcodone-acetaminophen (NORCO/VICODIN) 5-325 MG tablet Take 1-2 tablets by mouth every 4 (four) hours as needed. 03/02/18   Ivery Quale, PA-C  multivitamin (RENA-VIT) TABS tablet Take 1 tablet by mouth at bedtime. 11/22/17   Alford Highland, NP  Family History Family History  Problem Relation Age of Onset  . Hypertension Mother   . Hypertension Father   . Cancer Father        brother died of brain cancer; sister died of bone cancer  . Diabetes Sister   . Diabetes Brother   . Colon cancer Maternal Aunt   . Cardiomyopathy Neg Hx     Social History Social History   Tobacco Use  . Smoking status: Never Smoker  . Smokeless tobacco: Never Used  Substance Use Topics  . Alcohol use: No    Alcohol/week: 0.0 standard drinks  . Drug use: No     Allergies   Nsaids and Beta adrenergic blockers   Review of Systems Review of Systems  Constitutional: Positive for diaphoresis. Negative for fever.  Respiratory: Positive for shortness of breath. Negative for cough.   Cardiovascular: Negative for chest pain and leg swelling.  Gastrointestinal: Negative for anal bleeding,  diarrhea, nausea and vomiting.  Neurological: Positive for weakness. Negative for dizziness and headaches.  All other systems reviewed and are negative.    Physical Exam Updated Vital Signs BP 107/67   Pulse 89   Temp (!) 97.5 F (36.4 C)   Resp 16   SpO2 98%   Physical Exam  Constitutional: He is oriented to person, place, and time.  Chronically ill-appearing, obese, no acute distress  HENT:  Head: Normocephalic and atraumatic.  Eyes: Pupils are equal, round, and reactive to light.  Neck: Neck supple. JVD present.  Cardiovascular: Regular rhythm and normal heart sounds.  No murmur heard. Tachycardia, irregularly irregular rhythm Fistula with positive thrill right upper extremity  Pulmonary/Chest: Effort normal and breath sounds normal. No respiratory distress. He has no wheezes.  Tunneled catheter right subclavian  Abdominal: Soft. Bowel sounds are normal. There is no tenderness. There is no rebound.  Musculoskeletal: He exhibits edema.  Neurological: He is alert and oriented to person, place, and time.  Skin: Skin is warm and dry.  Psychiatric: He has a normal mood and affect.  Nursing note and vitals reviewed.    ED Treatments / Results  Labs (all labs ordered are listed, but only abnormal results are displayed) Labs Reviewed  CBC WITH DIFFERENTIAL/PLATELET - Abnormal; Notable for the following components:      Result Value   WBC 11.4 (*)    Hemoglobin 12.5 (*)    Neutro Abs 7.9 (*)    Monocytes Absolute 1.2 (*)    All other components within normal limits  PROTIME-INR - Abnormal; Notable for the following components:   Prothrombin Time 19.0 (*)    All other components within normal limits  COMPREHENSIVE METABOLIC PANEL - Abnormal; Notable for the following components:   Glucose, Bld 177 (*)    BUN 67 (*)    Creatinine, Ser 8.51 (*)    GFR calc non Af Amer 6 (*)    GFR calc Af Amer 7 (*)    Anion gap 16 (*)    All other components within normal limits    LIPID PANEL - Abnormal; Notable for the following components:   HDL 37 (*)    LDL Cholesterol 101 (*)    All other components within normal limits  I-STAT CHEM 8, ED - Abnormal; Notable for the following components:   BUN 66 (*)    Creatinine, Ser 9.30 (*)    Glucose, Bld 172 (*)    Calcium, Ion 1.10 (*)    All other components within normal limits  APTT  I-STAT TROPONIN, ED    EKG EKG Interpretation  Date/Time:  Tuesday March 26 2018 05:09:42 EDT Ventricular Rate:  146 PR Interval:    QRS Duration: 139 QT Interval:  360 QTC Calculation: 562 R Axis:   147 Text Interpretation:  atrial flutter? Probable left atrial enlargement Right bundle branch block Inferolateral infarct, age indeterminate Confirmed by Ross Marcus ([id number]) on 03/26/2018 5:21:37 AM   EKG Interpretation  Date/Time:  Tuesday March 26 2018 05:49:14 EDT Ventricular Rate:  96 PR Interval:    QRS Duration: 137 QT Interval:  438 QTC Calculation: 554 R Axis:   -122 Text Interpretation:  Sinus rhythm Borderline short PR interval Nonspecific intraventricular conduction delay Anterolateral infarct, old No longer in atrial flutter Confirmed by Ross Marcus ([id number]) on 03/26/2018 5:57:39 AM       Radiology Dg Chest Portable 1 View  Result Date: 03/26/2018 CLINICAL DATA:  Shortness of breath and weakness while watching television tonight, now resolved. History of end-stage renal disease on dialysis, CHF. EXAM: PORTABLE CHEST 1 VIEW COMPARISON:  Chest radiograph January 01, 2018 FINDINGS: Stable cardiomegaly. Low inspiratory examination. Pulmonary vasculature appears normal. Mildly elevated RIGHT hemidiaphragm without pleural effusion or focal consolidation. RIGHT costophrenic angle incompletely imaged. No pneumothorax. LEFT AICD in situ. Stable appearance of tunneled dialysis catheter via RIGHT internal jugular venous approach with distal tip projecting in distal superior vena cava. Soft tissue planes and  included osseous structures are unchanged. IMPRESSION: Stable cardiomegaly.  No acute pulmonary process. Electronically Signed   By: Awilda Metro M.D.   On: 03/26/2018 05:35    Procedures .Cardioversion Date/Time: 03/26/2018 5:58 AM Performed by: Shon Baton, MD Authorized by: Shon Baton, MD   Consent:    Consent obtained:  Written   Consent given by:  Patient   Risks discussed:  Cutaneous burn, death, induced arrhythmia and pain   Alternatives discussed:  No treatment and rate-control medication Pre-procedure details:    Cardioversion basis:  Emergent   Rhythm:  Atrial flutter   Electrode placement:  Anterior-posterior Patient sedated: Yes. Refer to sedation procedure documentation for details of sedation.  Attempt one:    Cardioversion mode:  Synchronous   Waveform:  Monophasic   Shock (Joules):  200   Shock outcome:  Conversion to normal sinus rhythm Post-procedure details:    Patient status:  Awake   Patient tolerance of procedure:  Tolerated well, no immediate complications Comments:     Required some O2 post sedation; history of sleep apnea; RR30's   .Sedation Date/Time: 03/26/2018 5:59 AM Performed by: Shon Baton, MD Authorized by: Shon Baton, MD   Consent:    Consent obtained:  Written   Consent given by:  Patient   Risks discussed:  Respiratory compromise necessitating ventilatory assistance and intubation, prolonged sedation necessitating reversal, prolonged hypoxia resulting in organ damage and inadequate sedation Universal protocol:    Procedure explained and questions answered to patient or proxy's satisfaction: yes     Immediately prior to procedure a time out was called: yes   Pre-sedation assessment:    Time since last food or drink:  Unknown   NPO status caution: unable to specify NPO status     ASA classification: class 3 - patient with severe systemic disease     Neck mobility: normal     Mouth opening:  2 finger  widths   Thyromental distance:  2 finger widths   Mallampati score:  III - soft palate, base of uvula visible  Pre-sedation assessments completed and reviewed: airway patency, cardiovascular function, mental status and respiratory function   Immediate pre-procedure details:    Reassessment: Patient reassessed immediately prior to procedure     Reviewed: vital signs     Verified: emergency equipment available, intubation equipment available and oxygen available   Procedure details (see MAR for exact dosages):    Preoxygenation:  Nasal cannula   Sedation:  Etomidate   Intra-procedure monitoring:  Blood pressure monitoring, continuous capnometry, cardiac monitor and continuous pulse oximetry   Intra-procedure events: hypoxia     Intra-procedure events comment:  Responsive to NRB   Intra-procedure management:  Airway repositioning   Total Provider sedation time (minutes):  15 Post-procedure details:    Post-sedation assessment completed:  03/26/2018 6:01 AM   Attendance: Constant attendance by certified staff until patient recovered     Recovery: Patient returned to pre-procedure baseline     Patient is stable for discharge or admission: yes     Patient tolerance:  Tolerated well, no immediate complications   (including critical care time)  CRITICAL CARE Performed by: Shon Baton   Total critical care time: 45 minutes  Critical care time was exclusive of separately billable procedures and treating other patients.  Critical care was necessary to treat or prevent imminent or life-threatening deterioration.  Critical care was time spent personally by me on the following activities: development of treatment plan with patient and/or surrogate as well as nursing, discussions with consultants, evaluation of patient's response to treatment, examination of patient, obtaining history from patient or surrogate, ordering and performing treatments and interventions, ordering and review of  laboratory studies, ordering and review of radiographic studies, pulse oximetry and re-evaluation of patient's condition.   Medications Ordered in ED Medications  etomidate (AMIDATE) injection 10 mg (10 mg Intravenous Given 03/26/18 0547)     Initial Impression / Assessment and Plan / ED Course  I have reviewed the triage vital signs and the nursing notes.  Pertinent labs & imaging results that were available during my care of the patient were reviewed by me and considered in my medical decision making (see chart for details).  Clinical Course as of Mar 27 655  Tue Mar 26, 2018  1308 Patient is at his baseline.  Feels much better after cardioversion.  He has remained in normal sinus rhythm.  Will attempt to discharge for him to have his normal dialysis session.  Nursing confirmed he will be able to dialyze later today.   [CH]    Clinical Course User Index [CH] Airanna Partin, Mayer Masker, MD    Patient presents with generalized weakness.  Denies chest pain or shortness of breath upon arrival.  His EKG appears to be atrial flutter.  It is somewhat more wide than his prior baseline EKGs but may be rate related.  Stat Chem-8 was obtained and shows no evidence of hyperkalemia.  He is otherwise stable.  He does have blood pressure that is slightly soft 90-100 systolic.  He has a history of the same.  Cardiology is at bedside.  STEMI code was canceled.  Suspect patient's symptoms may be related to atrial flutter with rapid ventricular response.  Troponin 0.07 which is at the patient's baseline.  Patient reports compliance with Eliquis.  He is a good candidate for cardioversion.  He was consented for sedation and cardioversion.  Cardioversion resulted in normal sinus rhythm.  He did have transient hypoxia with sedation but was never apneic.  Suspect this may be related to  his body habitus and history of sleep apnea.  He responded well with nonrebreather.  On last check, he was at his baseline with no  respiratory distress.  States he feels much better.  He is due to dialyze this morning.  Will call his dialysis center to ensure he can dialyze a little bit later.  He has called for a ride.  Follow-up with cardiology.  After history, exam, and medical workup I feel the patient has been appropriately medically screened and is safe for discharge home. Pertinent diagnoses were discussed with the patient. Patient was given return precautions.  Final Clinical Impressions(s) / ED Diagnoses   Final diagnoses:  Typical atrial flutter [facility])    ED Discharge Orders    None       Shon Baton, MD 03/26/18 [id number]

## 2018-03-26 NOTE — Sedation Documentation (Signed)
Consent for moderate sedation signed by patient, at bedside

## 2018-03-26 NOTE — Discharge Instructions (Addendum)
You were seen today for generalized weakness.  You were found to be in atrial flutter with a rapid ventricular response.  You were cardioverted successfully.  Your other lab work looks reassuring.  Follow-up with dialysis for your regularly scheduled dialysis today.  You also need to follow back up with cardiology.  If you have any new or worsening symptoms you should be reevaluated.

## 2018-03-26 NOTE — ED Notes (Signed)
Juleen China, MD at bedside talking with [facility] rep re: interrogation

## 2018-03-26 NOTE — ED Notes (Signed)
[facility] called to cancel Code stemi

## 2018-04-03 ENCOUNTER — Telehealth: Payer: Self-pay | Admitting: *Deleted

## 2018-04-03 ENCOUNTER — Ambulatory Visit (INDEPENDENT_AMBULATORY_CARE_PROVIDER_SITE_OTHER): Payer: Medicaid Other | Admitting: Internal Medicine

## 2018-04-03 ENCOUNTER — Encounter: Payer: Self-pay | Admitting: Internal Medicine

## 2018-04-03 VITALS — BP 90/62 | HR 90 | Ht 69.0 in | Wt 275.6 lb

## 2018-04-03 DIAGNOSIS — Z95810 Presence of automatic (implantable) cardiac defibrillator: Secondary | ICD-10-CM | POA: Diagnosis not present

## 2018-04-03 DIAGNOSIS — I5022 Chronic systolic (congestive) heart failure: Secondary | ICD-10-CM

## 2018-04-03 DIAGNOSIS — I472 Ventricular tachycardia, unspecified: Secondary | ICD-10-CM

## 2018-04-03 MED ORDER — AMIODARONE HCL 200 MG PO TABS: 200.0000 mg | ORAL_TABLET | Freq: Two times a day (BID) | ORAL | 6 refills | Status: DC

## 2018-04-03 MED ORDER — AMIODARONE HCL 200 MG PO TABS: 200.0000 mg | ORAL_TABLET | Freq: Two times a day (BID) | ORAL | 3 refills | Status: AC

## 2018-04-03 MED ORDER — MEXILETINE HCL 200 MG PO CAPS: 200.0000 mg | ORAL_CAPSULE | Freq: Two times a day (BID) | ORAL | 3 refills | Status: AC

## 2018-04-03 NOTE — Patient Instructions (Addendum)
Medication Instructions:  Your physician has recommended you make the following change in your medication:   1. Begin Mexiletine, 200mg  tablet, two times per day.  Labwork: You will have labs drawn today: BMP   Testing/Procedures: None ordered.  Follow-Up: Please keep your follow up appointment with Dr Graciela Husbands on October 11th.  Any Other Special Instructions Will Be Listed Below (If Applicable).   Mexiletine capsules What is this medicine? MEXILETINE (mex [place] e teen) is an antiarrhythmic agent. This medicine is used to treat irregular heart rhythm and can slow rapid heartbeats. It can help your heart to return to and maintain a normal rhythm. Because of the side effects caused by this medicine, it is usually used for heartbeat problems that may be life-threatening. This medicine may be used for other purposes; ask your health care provider or pharmacist if you have questions. COMMON BRAND NAME(S): Mexitil What should I tell my health care provider before I take this medicine? They need to know if you have any of these conditions: -liver disease -other heart problems -previous heart attack -an unusual or allergic reaction to mexiletine, other medicines, foods, dyes, or preservatives -pregnant or trying to get pregnant -breast-feeding How should I use this medicine? Take this medicine by mouth with a glass of water. Follow the directions on the prescription label. It is recommended that you take this medicine with food or an antacid. Take your doses at regular intervals. Do not take your medicine more often than directed. Do not stop taking except on the advice of your doctor or health care professional. Talk to your pediatrician regarding the use of this medicine in children. Special care may be needed. Overdosage: If you think you have taken too much of this medicine contact a poison control center or emergency room at once. NOTE: This medicine is only for you. Do not share this  medicine with others. What if I miss a dose? If you miss a dose, take it as soon as you can. If it is almost time for your next dose, take only that dose. Do not take double or extra doses. What may interact with this medicine? Do not take this medicine with any of the following medications: -dofetilide This medicine may also interact with the following medications: -caffeine -cimetidine -medicines for depression, anxiety, or psychotic disturbances -medicines to control heart rhythm -phenobarbital -phenytoin -rifampin -theophylline This list may not describe all possible interactions. Give your health care provider a list of all the medicines, herbs, non-prescription drugs, or dietary supplements you use. Also tell them if you smoke, drink alcohol, or use illegal drugs. Some items may interact with your medicine. What should I watch for while using this medicine? Your condition will be monitored closely when you first begin therapy. Often, this drug is first started in a hospital or other monitored health care setting. Once you are on maintenance therapy, visit your doctor or health care professional for regular checks on your progress. Because your condition and use of this medicine carry some risk, it is a good idea to carry an identification card, necklace or bracelet with details of your condition, medications, and doctor or health care professional. Bonita Quin may get drowsy or dizzy. Do not drive, use machinery, or do anything that needs mental alertness until you know how this medicine affects you. Do not stand or sit up quickly, especially if you are an older patient. This reduces the risk of dizzy or fainting spells. Alcohol can make you more dizzy, increase flushing  and rapid heartbeats. Avoid alcoholic drinks. What side effects may I notice from receiving this medicine? Side effects that you should report to your doctor or health care professional as soon as possible: -allergic reactions  like skin rash, itching or hives, swelling of the face, lips, or tongue -breathing problems -chest pain, continued irregular heartbeats -redness, blistering, peeling or loosening of the skin, including inside the mouth -seizures -skin rash -trembling, shaking -unusual bleeding or bruising -unusually weak or tired Side effects that usually do not require medical attention (report to your doctor or health care professional if they continue or are bothersome): -blurred vision -difficulty walking -heartburn -nausea, vomiting -nervousness -numbness, or tingling in the fingers or toes This list may not describe all possible side effects. Call your doctor for medical advice about side effects. You may report side effects to FDA at 1-800-FDA-1088. Where should I keep my medicine? Keep out of reach of children. Store at room temperature between 15 and 30 degrees C (59 and 86 degrees F). Throw away any unused medicine after the expiration date. NOTE: This sheet is a summary. It may not cover all possible information. If you have questions about this medicine, talk to your doctor, pharmacist, or health care provider.  2018 Elsevier/Gold Standard (2008-01-27 13:59:49)   If you need a refill on your cardiac medications before your next appointment, please call your pharmacy.

## 2018-04-03 NOTE — Progress Notes (Signed)
Patient Care Team: Synetta Shadow, MD as PCP - General Laurey Morale, MD as PCP - Cardiology (Cardiology)   HPI  Eric Murillo is a 57 y.o. male Seen in follow-up for ICD implanted 10/16 for primary prevention in the setting of nonischemic cardiomyopathy He has had a catheterization demonstrating no obstructive coronary disease. 8/14. He then developed atrial flutter  . He underwent cardioversion at that time. He's been treated with apixaban.  ECG was remarkable for extremely low voltage. He was to undergo fat pad biopsy. He underwent SPEP which showed no monoclonal spike.      DATE TEST EF   9/15 Echo   25 %   1/16 Echo   25-30 %   5/19 TEE 25-30%    Seen in ER for tachycardia.  Dr. Katrinka Blazing and consulted with Dr. Elberta Fortis who recommended cardioversion.  Looking at the strips however, there is 1:1 AV relationship.  I thought was likely an atrial tachycardia.   Date K Hgb  6/19 3.35 12.4  /       Seen in the ER last week and cardioverted for wide-complex tachycardia interpreted as atrial flutter.  Review of the echocardiograms demonstrate that it was ventricular tachycardia below the detection rate.  It was appropriately classified as VT but was in a monitoring zone; no therapy was delivered.  He also suffered a VF syncopal event occurring prior to dialysis.  It was only 3-day intervals what was on his prolonged intradialytic interval.  He has had recurrent episodes of VF and syncope prompting this visit today.  This is on a short intradialytic interval  Past Medical History:  Diagnosis Date  . AICD (automatic cardioverter/defibrillator) present   . Anemia of chronic disease   . Arthritis   . Atrial flutter (HCC)    DCCV 9/15  . Chest pain on exertion   . Chronic systolic congestive heart failure, NYHA class 2 (HCC)    EF 20-25% 1/16  . CKD (chronic kidney disease) stage 3, GFR 30-59 ml/min (HCC)   . Dyslipidemia   . Gout    Of big toe  . Hyperglycemia   .  Hyperlipidemia   . Hypertension   . Morbid obesity with BMI of 45.0-49.9, adult (HCC)   . Non-ischemic cardiomyopathy (HCC)   . Organic erectile dysfunction   . OSA (obstructive sleep apnea)   . Pilonidal cyst   . Submandibular sialolithiasis    Right    Past Surgical History:  Procedure Laterality Date  . AV FISTULA PLACEMENT Right 11/12/2017   Procedure: CREATION of RIGHT BRACHIAL CEPHALIC ARTERIOVENOUS (AV) FISTULA;  Surgeon: Sherren Kerns, MD;  Location: [facility] OR;  Service: Vascular;  Laterality: Right;  . CARDIAC CATHETERIZATION  2012  . CARDIOVERSION N/A 04/03/2014   Procedure: CARDIOVERSION;  Surgeon: Laurey Morale, MD;  Location: [facility] ENDOSCOPY;  Service: Cardiovascular;  Laterality: N/A;  . CARDIOVERSION N/A 03/09/2016   Procedure: CARDIOVERSION;  Surgeon: Laurey Morale, MD;  Location: [facility] ENDOSCOPY;  Service: Cardiovascular;  Laterality: N/A;  . CARDIOVERSION N/A 06/06/2017   Procedure: CARDIOVERSION;  Surgeon: Laurey Morale, MD;  Location: [facility] ENDOSCOPY;  Service: Cardiovascular;  Laterality: N/A;  . CARDIOVERSION N/A 11/21/2017   Procedure: CARDIOVERSION;  Surgeon: Laurey Morale, MD;  Location: [facility] ENDOSCOPY;  Service: Cardiovascular;  Laterality: N/A;  . COLONOSCOPY    . EP IMPLANTABLE DEVICE N/A 05/21/2015   Procedure: ICD Implant;  Surgeon: Duke Salvia, MD;  Location: [facility] INVASIVE CV  LAB;  Service: Cardiovascular;  Laterality: N/A;  . INSERTION OF DIALYSIS CATHETER Right 11/12/2017   Procedure: PLACEMENT of right internal jugualr tunneled DIALYSIS CATHETER with removal or temporary dialysis catheter;  Surgeon: Sherren Kerns, MD;  Location: [facility] OR;  Service: Vascular;  Laterality: Right;  . IR FLUORO GUIDE CV LINE RIGHT  11/08/2017  . IR US GUIDE VASC ACCESS RIGHT  11/08/2017  . LEFT AND RIGHT HEART CATHETERIZATION WITH CORONARY ANGIOGRAM N/A 04/24/2014   Procedure: LEFT AND RIGHT HEART CATHETERIZATION WITH CORONARY ANGIOGRAM;  Surgeon: Laurey Morale, MD;  Location:  [facility] CATH LAB;  Service: Cardiovascular;  Laterality: N/A;  . SALIVARY GLAND SURGERY  09/12/2012  . SUBMANDIBULAR GLAND EXCISION Right 09/12/2012   Procedure: Removal Right Submandibular Larina Bras;  Surgeon: Serena Colonel, MD;  Location: [facility] OR;  Service: ENT;  Laterality: Right;  . TEE WITHOUT CARDIOVERSION N/A 04/03/2014   Procedure: TRANSESOPHAGEAL ECHOCARDIOGRAM (TEE);  Surgeon: Laurey Morale, MD;  Location: [facility] ENDOSCOPY;  Service: Cardiovascular;  Laterality: N/A;  . TEE WITHOUT CARDIOVERSION N/A 03/09/2016   Procedure: TRANSESOPHAGEAL ECHOCARDIOGRAM (TEE);  Surgeon: Laurey Morale, MD;  Location: [facility] ENDOSCOPY;  Service: Cardiovascular;  Laterality: N/A;  . TEE WITHOUT CARDIOVERSION N/A 06/06/2017   Procedure: TRANSESOPHAGEAL ECHOCARDIOGRAM (TEE);  Surgeon: Laurey Morale, MD;  Location: [facility] ENDOSCOPY;  Service: Cardiovascular;  Laterality: N/A;  . TEE WITHOUT CARDIOVERSION N/A 11/21/2017   Procedure: TRANSESOPHAGEAL ECHOCARDIOGRAM (TEE);  Surgeon: Laurey Morale, MD;  Location: [facility] ENDOSCOPY;  Service: Cardiovascular;  Laterality: N/A;    Current Outpatient Medications  Medication Sig Dispense Refill  . allopurinol (ZYLOPRIM) 100 MG tablet TAKE 1 TABLET BY MOUTH TWO TIMES DAILY 180 tablet 1  . amiodarone (PACERONE) 200 MG tablet Take 1 tablet (200 mg total) by mouth 2 (two) times daily. 60 tablet 6  . apixaban (ELIQUIS) 2.5 MG TABS tablet Take 1 tablet (2.5 mg total) by mouth 2 (two) times daily. 60 tablet 1  . calcitRIOL (ROCALTROL) 0.5 MCG capsule Take 1 capsule (0.5 mcg total) by mouth every other day. (Patient taking differently: Take 0.5 mcg by mouth Every Tuesday,Thursday,and Saturday with dialysis. ) 15 capsule 0  . calcium acetate (PHOSLO) 667 MG capsule TAKE 2 CAPSULES (1,334 MG TOTAL) BY MOUTH 3 (THREE) TIMES DAILY WITH MEALS. 180 capsule 0  . carvedilol (COREG) 3.125 MG tablet Take 1 tablet (3.125 mg total) by mouth 2 (two) times daily with a meal. Give ONLY on non-HD days. Hold on  dialysis days. 40 tablet 6  . clindamycin (CLEOCIN) 300 MG capsule Take 1 capsule (300 mg total) by mouth 3 (three) times daily. 21 capsule 0  . diclofenac sodium (VOLTAREN) 1 % GEL Apply 4 g topically 4 (four) times daily. (Patient taking differently: Apply 4 g topically 4 (four) times daily as needed (for pain at affected sites). ) 1 Tube 2  . HYDROcodone-acetaminophen (NORCO/VICODIN) 5-325 MG tablet Take 1-2 tablets by mouth every 4 (four) hours as needed. 15 tablet 0  . multivitamin (RENA-VIT) TABS tablet Take 1 tablet by mouth at bedtime. 30 tablet 6   No current facility-administered medications for this visit.     Allergies  Allergen Reactions  . Nsaids Other (See Comments)    Cannot take to due kidney issues  . Beta Adrenergic Blockers Other (See Comments)    An unnamed, white-colored Beta Blocker made him "feel funny" = made him feel "cagey"      Review of Systems negative except from HPI and PMH  Physical  Exam BP 90/62   Pulse 90   Ht 5\' 9"  (1.753 m)   Wt 275 lb 9.6 oz (125 kg)   SpO2 98%   BMI 40.70 kg/m  Well developed and nourished in no acute distress HENT normal Neck supple with JVP-flat Clear Regular rate and rhythm, no murmurs or gallops Abd-soft with active BS No Clubbing cyanosis edema Skin-warm and dry A & Oriented  Grossly normal sensory and motor function  ECG  Sinus with LONG 1 AVB 36/17/46   Assessment and  Plan  Nonischemic cardiomyopathy-probably infiltrative with low voltage and an unusual axis and conduction system disease  Congestive heart failure-chronic-class III  Chronic kidney disease class III  Implantable defibrillator-[employer]  Relative sinus tachycardia  First-degree AV block  Ventricular tachycardia  Ventricular fibrillation    He continues to have life-threatening arrhythmias in the context of his cardiomyopathy.  At this juncture will add mexiletine to his amiodarone, the latter potentially aggravating first-degree  AV block and thus potentially exercise tolerance.  Ranolazine would be another option.  He is to follow-up with Dr. DM heart failure clinic.  I do not know whether he is a candidate for advanced therapies for arrhythmia support  We will check his potassium today  As can be imagined, he is struggling significantly with the imposing future of his cardiac condition   More than 50% of 40 min was spent in counseling related to the above

## 2018-04-03 NOTE — Telephone Encounter (Signed)
Alert transmission received from ICD home monitor. VF episode with shock 04/02/18 at 10:39pm. Mr. Dorin reports that he was bending over to pick something up and got dizzy and was making his way to the floor. He does not recall being shocked so it is presumed that he lost consciousness. His amiodarone was increased to 400mg  BID x 2 weeks by Gypsy Balsam, NP, he finished this yesterday and is now taking 200mg  BID.  He feels well today. I will review with Dr. Graciela Husbands and call back with any recommendations.

## 2018-04-03 NOTE — Telephone Encounter (Signed)
Reviewed with Dr. Graciela Husbands. He would like to see him in-office. Availability to 12:30pm today- Eric Murillo is agreeable.

## 2018-04-04 ENCOUNTER — Inpatient Hospital Stay (HOSPITAL_COMMUNITY)
Admission: EM | Admit: 2018-04-04 | Discharge: 2018-05-24 | DRG: 286 | Disposition: E | Payer: Medicaid Other | Attending: Cardiology | Admitting: Cardiology

## 2018-04-04 ENCOUNTER — Other Ambulatory Visit: Payer: Self-pay

## 2018-04-04 ENCOUNTER — Emergency Department (HOSPITAL_COMMUNITY): Payer: Medicaid Other

## 2018-04-04 ENCOUNTER — Telehealth (HOSPITAL_COMMUNITY): Payer: Self-pay

## 2018-04-04 ENCOUNTER — Encounter (HOSPITAL_COMMUNITY): Payer: Self-pay | Admitting: Emergency Medicine

## 2018-04-04 ENCOUNTER — Ambulatory Visit (INDEPENDENT_AMBULATORY_CARE_PROVIDER_SITE_OTHER): Payer: Medicaid Other | Admitting: *Deleted

## 2018-04-04 DIAGNOSIS — I484 Atypical atrial flutter: Secondary | ICD-10-CM | POA: Diagnosis present

## 2018-04-04 DIAGNOSIS — I5022 Chronic systolic (congestive) heart failure: Secondary | ICD-10-CM | POA: Diagnosis present

## 2018-04-04 DIAGNOSIS — E1122 Type 2 diabetes mellitus with diabetic chronic kidney disease: Secondary | ICD-10-CM | POA: Diagnosis present

## 2018-04-04 DIAGNOSIS — Z6841 Body Mass Index (BMI) 40.0 and over, adult: Secondary | ICD-10-CM | POA: Diagnosis not present

## 2018-04-04 DIAGNOSIS — N186 End stage renal disease: Secondary | ICD-10-CM | POA: Diagnosis present

## 2018-04-04 DIAGNOSIS — Z7189 Other specified counseling: Secondary | ICD-10-CM

## 2018-04-04 DIAGNOSIS — R55 Syncope and collapse: Secondary | ICD-10-CM | POA: Diagnosis not present

## 2018-04-04 DIAGNOSIS — I428 Other cardiomyopathies: Secondary | ICD-10-CM

## 2018-04-04 DIAGNOSIS — Z791 Long term (current) use of non-steroidal anti-inflammatories (NSAID): Secondary | ICD-10-CM

## 2018-04-04 DIAGNOSIS — J9601 Acute respiratory failure with hypoxia: Secondary | ICD-10-CM | POA: Diagnosis not present

## 2018-04-04 DIAGNOSIS — R6521 Severe sepsis with septic shock: Secondary | ICD-10-CM | POA: Diagnosis not present

## 2018-04-04 DIAGNOSIS — Z95810 Presence of automatic (implantable) cardiac defibrillator: Secondary | ICD-10-CM

## 2018-04-04 DIAGNOSIS — I5023 Acute on chronic systolic (congestive) heart failure: Secondary | ICD-10-CM | POA: Diagnosis present

## 2018-04-04 DIAGNOSIS — J9600 Acute respiratory failure, unspecified whether with hypoxia or hypercapnia: Secondary | ICD-10-CM | POA: Diagnosis not present

## 2018-04-04 DIAGNOSIS — I472 Ventricular tachycardia, unspecified: Secondary | ICD-10-CM

## 2018-04-04 DIAGNOSIS — Z9289 Personal history of other medical treatment: Secondary | ICD-10-CM

## 2018-04-04 DIAGNOSIS — E785 Hyperlipidemia, unspecified: Secondary | ICD-10-CM | POA: Diagnosis present

## 2018-04-04 DIAGNOSIS — R570 Cardiogenic shock: Secondary | ICD-10-CM | POA: Diagnosis not present

## 2018-04-04 DIAGNOSIS — Z8249 Family history of ischemic heart disease and other diseases of the circulatory system: Secondary | ICD-10-CM | POA: Diagnosis not present

## 2018-04-04 DIAGNOSIS — E872 Acidosis, unspecified: Secondary | ICD-10-CM

## 2018-04-04 DIAGNOSIS — Z992 Dependence on renal dialysis: Secondary | ICD-10-CM

## 2018-04-04 DIAGNOSIS — Z978 Presence of other specified devices: Secondary | ICD-10-CM | POA: Diagnosis not present

## 2018-04-04 DIAGNOSIS — Z452 Encounter for adjustment and management of vascular access device: Secondary | ICD-10-CM

## 2018-04-04 DIAGNOSIS — R5381 Other malaise: Secondary | ICD-10-CM | POA: Diagnosis not present

## 2018-04-04 DIAGNOSIS — R042 Hemoptysis: Secondary | ICD-10-CM | POA: Diagnosis not present

## 2018-04-04 DIAGNOSIS — E876 Hypokalemia: Secondary | ICD-10-CM | POA: Diagnosis not present

## 2018-04-04 DIAGNOSIS — A419 Sepsis, unspecified organism: Secondary | ICD-10-CM | POA: Diagnosis not present

## 2018-04-04 DIAGNOSIS — Z833 Family history of diabetes mellitus: Secondary | ICD-10-CM

## 2018-04-04 DIAGNOSIS — Z9115 Patient's noncompliance with renal dialysis: Secondary | ICD-10-CM

## 2018-04-04 DIAGNOSIS — I429 Cardiomyopathy, unspecified: Secondary | ICD-10-CM | POA: Diagnosis not present

## 2018-04-04 DIAGNOSIS — G4733 Obstructive sleep apnea (adult) (pediatric): Secondary | ICD-10-CM | POA: Diagnosis not present

## 2018-04-04 DIAGNOSIS — Z539 Procedure and treatment not carried out, unspecified reason: Secondary | ICD-10-CM | POA: Diagnosis not present

## 2018-04-04 DIAGNOSIS — E440 Moderate protein-calorie malnutrition: Secondary | ICD-10-CM

## 2018-04-04 DIAGNOSIS — E875 Hyperkalemia: Secondary | ICD-10-CM | POA: Diagnosis not present

## 2018-04-04 DIAGNOSIS — Z79899 Other long term (current) drug therapy: Secondary | ICD-10-CM | POA: Diagnosis not present

## 2018-04-04 DIAGNOSIS — R1310 Dysphagia, unspecified: Secondary | ICD-10-CM | POA: Diagnosis not present

## 2018-04-04 DIAGNOSIS — N2581 Secondary hyperparathyroidism of renal origin: Secondary | ICD-10-CM | POA: Diagnosis present

## 2018-04-04 DIAGNOSIS — R0602 Shortness of breath: Secondary | ICD-10-CM

## 2018-04-04 DIAGNOSIS — J189 Pneumonia, unspecified organism: Secondary | ICD-10-CM

## 2018-04-04 DIAGNOSIS — R569 Unspecified convulsions: Secondary | ICD-10-CM | POA: Diagnosis not present

## 2018-04-04 DIAGNOSIS — I272 Pulmonary hypertension, unspecified: Secondary | ICD-10-CM | POA: Diagnosis present

## 2018-04-04 DIAGNOSIS — I469 Cardiac arrest, cause unspecified: Secondary | ICD-10-CM

## 2018-04-04 DIAGNOSIS — Z66 Do not resuscitate: Secondary | ICD-10-CM | POA: Diagnosis not present

## 2018-04-04 DIAGNOSIS — I132 Hypertensive heart and chronic kidney disease with heart failure and with stage 5 chronic kidney disease, or end stage renal disease: Secondary | ICD-10-CM | POA: Diagnosis present

## 2018-04-04 DIAGNOSIS — Z7901 Long term (current) use of anticoagulants: Secondary | ICD-10-CM

## 2018-04-04 DIAGNOSIS — G9340 Encephalopathy, unspecified: Secondary | ICD-10-CM

## 2018-04-04 DIAGNOSIS — I5043 Acute on chronic combined systolic (congestive) and diastolic (congestive) heart failure: Secondary | ICD-10-CM | POA: Diagnosis not present

## 2018-04-04 DIAGNOSIS — T82590A Other mechanical complication of surgically created arteriovenous fistula, initial encounter: Secondary | ICD-10-CM

## 2018-04-04 DIAGNOSIS — D631 Anemia in chronic kidney disease: Secondary | ICD-10-CM | POA: Diagnosis present

## 2018-04-04 DIAGNOSIS — Z789 Other specified health status: Secondary | ICD-10-CM

## 2018-04-04 DIAGNOSIS — Z515 Encounter for palliative care: Secondary | ICD-10-CM | POA: Diagnosis not present

## 2018-04-04 DIAGNOSIS — I4891 Unspecified atrial fibrillation: Secondary | ICD-10-CM | POA: Diagnosis present

## 2018-04-04 DIAGNOSIS — R112 Nausea with vomiting, unspecified: Secondary | ICD-10-CM | POA: Diagnosis not present

## 2018-04-04 LAB — CBC
HEMATOCRIT: 36.5 % — AB (ref 39.0–52.0)
HEMOGLOBIN: 11.4 g/dL — AB (ref 13.0–17.0)
MCH: 29.8 pg (ref 26.0–34.0)
MCHC: 31.2 g/dL (ref 30.0–36.0)
MCV: 95.3 fL (ref 78.0–100.0)
Platelets: 191 10*3/uL (ref 150–400)
RBC: 3.83 MIL/uL — AB (ref 4.22–5.81)
RDW: 14.5 % (ref 11.5–15.5)
WBC: 10 10*3/uL (ref 4.0–10.5)

## 2018-04-04 LAB — I-STAT TROPONIN, ED: Troponin i, poc: 0.02 ng/mL (ref 0.00–0.08)

## 2018-04-04 LAB — BASIC METABOLIC PANEL
ANION GAP: 11 (ref 5–15)
BUN/Creatinine Ratio: 6 — ABNORMAL LOW (ref 9–20)
BUN: 38 mg/dL — ABNORMAL HIGH (ref 6–24)
BUN: 44 mg/dL — ABNORMAL HIGH (ref 6–20)
CALCIUM: 8.8 mg/dL — AB (ref 8.9–10.3)
CO2: 25 mmol/L (ref 20–29)
CO2: 29 mmol/L (ref 22–32)
Calcium: 9.5 mg/dL (ref 8.7–10.2)
Chloride: 92 mmol/L — ABNORMAL LOW (ref 96–106)
Chloride: 96 mmol/L — ABNORMAL LOW (ref 98–111)
Creatinine, Ser: 6.46 mg/dL — ABNORMAL HIGH (ref 0.76–1.27)
Creatinine, Ser: 6.94 mg/dL — ABNORMAL HIGH (ref 0.61–1.24)
GFR calc Af Amer: 10 mL/min/{1.73_m2} — ABNORMAL LOW (ref >59)
GFR calc non Af Amer: 9 mL/min/{1.73_m2} — ABNORMAL LOW (ref >59)
GFR, EST AFRICAN AMERICAN: 9 mL/min — AB (ref >60)
GFR, EST NON AFRICAN AMERICAN: 8 mL/min — AB (ref >60)
GLUCOSE: 89 mg/dL (ref 65–99)
GLUCOSE: 108 mg/dL — AB (ref 70–99)
POTASSIUM: 4.2 mmol/L (ref 3.5–5.2)
POTASSIUM: 3.5 mmol/L (ref 3.5–5.1)
Sodium: 140 mmol/L (ref 134–144)
Sodium: 136 mmol/L (ref 135–145)

## 2018-04-04 NOTE — ED Provider Notes (Signed)
[facility] EMERGENCY DEPARTMENT Provider Note   CSN: [id number] Arrival date & time: 2018/04/10  1729     History   Chief Complaint Chief Complaint  Patient presents with  . Loss of Consciousness    HPI Eric Murillo is a 57 y.o. male.  HPI  57 year old male, he has a known history of congestive heart failure with a very poor ejection fraction of only 20 to 25% as of 2016 based on medical record review.  He is on dialysis, he has end-stage renal disease, his fistula is in his right upper extremity.  He was at dialysis today but unfortunately they were not able to obtain access to his fistula and told him to come back tomorrow.  He went to his girlfriend's house where he had a syncopal episode.  This was reportedly witnessed by the girlfriend, the patient has no prodromal symptoms, he does not recall feeling a defibrillatory event, when he came around he was his normal self and did not have any symptoms as he does not have any symptoms at this time either.  He does endorse some lower extremity swelling which is chronic and seems to be persistent when he misses dialysis.  He does not feel short of breath and has no other symptoms.  He was brought in by paramedics, no arrhythmias seen prehospital.  Of note the patient was started on a new antiarrhythmic yesterday, he has not yet had his first dose  Past Medical History:  Diagnosis Date  . AICD (automatic cardioverter/defibrillator) present   . Anemia of chronic disease   . Arthritis   . Atrial flutter (HCC)    DCCV 9/15  . Chest pain on exertion   . Chronic systolic congestive heart failure, NYHA class 2 (HCC)    EF 20-25% 1/16  . CKD (chronic kidney disease) stage 3, GFR 30-59 ml/min (HCC)   . Dyslipidemia   . Gout    Of big toe  . Hyperglycemia   . Hyperlipidemia   . Hypertension   . Morbid obesity with BMI of 45.0-49.9, adult (HCC)   . Non-ischemic cardiomyopathy (HCC)   . Organic erectile dysfunction   . OSA (obstructive  sleep apnea)   . Pilonidal cyst   . Submandibular sialolithiasis    Right    Patient Active Problem List   Diagnosis Date Noted  . Atrial fibrillation (HCC) 11/13/2017  . ESRD (end stage renal disease) on dialysis (HCC) 11/13/2017  . 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 Long term current use of anticoagulant therapy 04/07/2015  . Health care maintenance 03/10/2015  . Osteoarthritis 06/30/2014  . Anemia of chronic disease 04/21/2014  . Gout 04/21/2014  . Atrial flutter (HCC) 03/24/2014  . Obstructive sleep apnea 12/11/2011  . Cardiomyopathy, nonischemic (HCC) 09/30/2010  . Chronic systolic heart failure (HCC) 09/15/2010  . History of cardiac arrest 04/19/2009  . Hyperlipidemia 07/04/2006  . Morbid obesity with BMI of 45.0-49.9, adult (HCC) 07/04/2006  . Hypertensive cardiovascular disease 05/30/2006    Past Surgical History:  Procedure Laterality Date  . AV FISTULA PLACEMENT Right 11/12/2017   Procedure: CREATION of RIGHT BRACHIAL CEPHALIC ARTERIOVENOUS (AV) FISTULA;  Surgeon: Sherren Kerns, MD;  Location: [facility] OR;  Service: Vascular;  Laterality: Right;  . CARDIAC CATHETERIZATION  2012  . CARDIOVERSION N/A 04/03/2014   Procedure: CARDIOVERSION;  Surgeon: Laurey Morale, MD;  Location: [facility] ENDOSCOPY;  Service: Cardiovascular;  Laterality: N/A;  . CARDIOVERSION N/A 03/09/2016   Procedure: CARDIOVERSION;  Surgeon: Laurey Morale, MD;  Location: [facility] ENDOSCOPY;  Service: Cardiovascular;  Laterality: N/A;  . CARDIOVERSION N/A 06/06/2017   Procedure: CARDIOVERSION;  Surgeon: Laurey Morale, MD;  Location: [facility] ENDOSCOPY;  Service: Cardiovascular;  Laterality: N/A;  . CARDIOVERSION N/A 11/21/2017   Procedure: CARDIOVERSION;  Surgeon: Laurey Morale, MD;  Location: [facility] ENDOSCOPY;  Service: Cardiovascular;  Laterality: N/A;  . COLONOSCOPY    . EP IMPLANTABLE DEVICE N/A 05/21/2015   Procedure: ICD Implant;  Surgeon: Duke Salvia, MD;  Location: [facility] INVASIVE CV LAB;  Service:  Cardiovascular;  Laterality: N/A;  . INSERTION OF DIALYSIS CATHETER Right 11/12/2017   Procedure: PLACEMENT of right internal jugualr tunneled DIALYSIS CATHETER with removal or temporary dialysis catheter;  Surgeon: Sherren Kerns, MD;  Location: [facility] OR;  Service: Vascular;  Laterality: Right;  . IR FLUORO GUIDE CV LINE RIGHT  11/08/2017  . IR US GUIDE VASC ACCESS RIGHT  11/08/2017  . LEFT AND RIGHT HEART CATHETERIZATION WITH CORONARY ANGIOGRAM N/A 04/24/2014   Procedure: LEFT AND RIGHT HEART CATHETERIZATION WITH CORONARY ANGIOGRAM;  Surgeon: Laurey Morale, MD;  Location: [facility] CATH LAB;  Service: Cardiovascular;  Laterality: N/A;  . SALIVARY GLAND SURGERY  09/12/2012  . SUBMANDIBULAR GLAND EXCISION Right 09/12/2012   Procedure: Removal Right Submandibular Larina Bras;  Surgeon: Serena Colonel, MD;  Location: [facility] OR;  Service: ENT;  Laterality: Right;  . TEE WITHOUT CARDIOVERSION N/A 04/03/2014   Procedure: TRANSESOPHAGEAL ECHOCARDIOGRAM (TEE);  Surgeon: Laurey Morale, MD;  Location: [facility] ENDOSCOPY;  Service: Cardiovascular;  Laterality: N/A;  . TEE WITHOUT CARDIOVERSION N/A 03/09/2016   Procedure: TRANSESOPHAGEAL ECHOCARDIOGRAM (TEE);  Surgeon: Laurey Morale, MD;  Location: [facility] ENDOSCOPY;  Service: Cardiovascular;  Laterality: N/A;  . TEE WITHOUT CARDIOVERSION N/A 06/06/2017   Procedure: TRANSESOPHAGEAL ECHOCARDIOGRAM (TEE);  Surgeon: Laurey Morale, MD;  Location: [facility] ENDOSCOPY;  Service: Cardiovascular;  Laterality: N/A;  . TEE WITHOUT CARDIOVERSION N/A 11/21/2017   Procedure: TRANSESOPHAGEAL ECHOCARDIOGRAM (TEE);  Surgeon: Laurey Morale, MD;  Location: [facility] ENDOSCOPY;  Service: Cardiovascular;  Laterality: N/A;        Home Medications    Prior to Admission medications   Medication Sig Start Date End Date Taking? Authorizing Provider  allopurinol (ZYLOPRIM) 100 MG tablet TAKE 1 TABLET BY MOUTH TWO TIMES DAILY 02/07/18   Inez Catalina, MD  amiodarone (PACERONE) 200 MG tablet Take 1 tablet (200 mg  total) by mouth 2 (two) times daily. 04/03/18   Duke Salvia, MD  apixaban (ELIQUIS) 2.5 MG TABS tablet Take 1 tablet (2.5 mg total) by mouth 2 (two) times daily. 03/13/18   Levert Feinstein, MD  calcitRIOL (ROCALTROL) 0.5 MCG capsule Take 1 capsule (0.5 mcg total) by mouth every other day. Patient taking differently: Take 0.5 mcg by mouth Every Tuesday,Thursday,and Saturday with dialysis.  11/24/17   Alford Highland, NP  calcium acetate (PHOSLO) 667 MG capsule TAKE 2 CAPSULES (1,334 MG TOTAL) BY MOUTH 3 (THREE) TIMES DAILY WITH MEALS. 03/14/18   Levert Feinstein, MD  carvedilol (COREG) 3.125 MG tablet Take 1 tablet (3.125 mg total) by mouth 2 (two) times daily with a meal. Give ONLY on non-HD days. Hold on dialysis days. 11/22/17   Alford Highland, NP  clindamycin (CLEOCIN) 300 MG capsule Take 1 capsule (300 mg total) by mouth 3 (three) times daily. 03/02/18   Ivery Quale, PA-C  diclofenac sodium (VOLTAREN) 1 % GEL Apply 4 g topically 4 (four) times daily. Patient taking differently: Apply 4 g topically 4 (four) times  daily as needed (for pain at affected sites).  02/07/17   Darreld Mclean, MD  HYDROcodone-acetaminophen (NORCO/VICODIN) 5-325 MG tablet Take 1-2 tablets by mouth every 4 (four) hours as needed. 03/02/18   Ivery Quale, PA-C  mexiletine (MEXITIL) 200 MG capsule Take 1 capsule (200 mg total) by mouth 2 (two) times daily. 04/03/18   Duke Salvia, MD  multivitamin (RENA-VIT) TABS tablet Take 1 tablet by mouth at bedtime. 11/22/17   Alford Highland, NP    Family History Family History  Problem Relation Age of Onset  . Hypertension Mother   . Hypertension Father   . Cancer Father        brother died of brain cancer; sister died of bone cancer  . Diabetes Sister   . Diabetes Brother   . Colon cancer Maternal Aunt   . Cardiomyopathy Neg Hx     Social History Social History   Tobacco Use  . Smoking status: Never Smoker  . Smokeless tobacco: Never Used  Substance Use Topics   . Alcohol use: No    Alcohol/week: 0.0 standard drinks  . Drug use: No     Allergies   Nsaids and Beta adrenergic blockers   Review of Systems Review of Systems  All other systems reviewed and are negative.    Physical Exam Updated Vital Signs BP (!) 84/67   Pulse 83   Temp 98.2 F (36.8 C) (Oral)   Resp 19   Ht 1.765 m (5' 9.5")   Wt (!) 170.1 kg   SpO2 99%   BMI 54.58 kg/m   Physical Exam  Constitutional: He appears well-developed and well-nourished. No distress.  HENT:  Head: Normocephalic and atraumatic.  Mouth/Throat: Oropharynx is clear and moist. No oropharyngeal exudate.  Eyes: Pupils are equal, round, and reactive to light. Conjunctivae and EOM are normal. Right eye exhibits no discharge. Left eye exhibits no discharge. No scleral icterus.  Neck: Normal range of motion. Neck supple. No JVD present. No thyromegaly present.  Cardiovascular: Normal rate, regular rhythm, normal heart sounds and intact distal pulses. Exam reveals no gallop and no friction rub.  No murmur heard. Pulmonary/Chest: Effort normal and breath sounds normal. No respiratory distress. He has no wheezes. He has no rales.  Defibrillator palpated in the left upper chest wall  Abdominal: Soft. Bowel sounds are normal. He exhibits no distension and no mass. There is no tenderness.  Musculoskeletal: Normal range of motion. He exhibits edema ( Scant bilateral lower extremity edema). He exhibits no tenderness.  Lymphadenopathy:    He has no cervical adenopathy.  Neurological: He is alert. Coordination normal.  Skin: Skin is warm and dry. No rash noted. No erythema.  Psychiatric: He has a normal mood and affect. His behavior is normal.  Nursing note and vitals reviewed.    ED Treatments / Results  Labs (all labs ordered are listed, but only abnormal results are displayed) Labs Reviewed  CBC - Abnormal; Notable for the following components:      Result Value   RBC 3.83 (*)    Hemoglobin  11.4 (*)    HCT 36.5 (*)    All other components within normal limits  BASIC METABOLIC PANEL - Abnormal; Notable for the following components:   Chloride 96 (*)    Glucose, Bld 108 (*)    BUN 44 (*)    Creatinine, Ser 6.94 (*)    Calcium 8.8 (*)    GFR calc non Af Amer 8 (*)  GFR calc Af Amer 9 (*)    All other components within normal limits  I-STAT TROPONIN, ED    EKG EKG Interpretation  Date/Time:  Thursday April 04 2018 17:30:33 EDT Ventricular Rate:  87 PR Interval:    QRS Duration: 139 QT Interval:  437 QTC Calculation: 526 R Axis:   -124 Text Interpretation:  Sinus or ectopic atrial rhythm Ventricular premature complex Consider dextrocardia since last tracing no significant change Confirmed by Eber Hong ([id number]) on 04/09/2018 5:42:31 PM   Radiology Dg Chest Port 1 View  Result Date: 04/03/2018 CLINICAL DATA:  Patient states he has had syncopal episodes today and jerked and felt like his ICD went off. Hx of AICD, atrial flutter, HTN, and cardiac cath. EXAM: PORTABLE CHEST 1 VIEW COMPARISON:  Chest x-ray dated 03/26/2018. FINDINGS: Stable cardiomegaly. Lungs are clear. No pleural effusion or pneumothorax. LEFT chest wall pacemaker apparatus appears stable. Osseous structures about the chest are unremarkable. IMPRESSION: No active disease.  Stable cardiomegaly. Electronically Signed   By: Bary Richard M.D.   On: 04/17/2018 20:32    Procedures .Critical Care Performed by: Eber Hong, MD Authorized by: Eber Hong, MD   Critical care provider statement:    Critical care time (minutes):  35   Critical care time was exclusive of:  Separately billable procedures and treating other patients and teaching time   Critical care was necessary to treat or prevent imminent or life-threatening deterioration of the following conditions:  Cardiac failure   Critical care was time spent personally by me on the following activities:  Blood draw for specimens, development of  treatment plan with patient or surrogate, discussions with consultants, evaluation of patient's response to treatment, examination of patient, obtaining history from patient or surrogate, ordering and performing treatments and interventions, ordering and review of laboratory studies, ordering and review of radiographic studies, pulse oximetry, re-evaluation of patient's condition and review of old charts   (including critical care time)  Medications Ordered in ED Medications - No data to display   Initial Impression / Assessment and Plan / ED Course  I have reviewed the triage vital signs and the nursing notes.  Pertinent labs & imaging results that were available during my care of the patient were reviewed by me and considered in my medical decision making (see chart for details).    Overall the patient is well-appearing, he has a rate of approximately 85 bpm and what appears to be a sinus rhythm.  He has no acute symptoms, no findings on exam other than a slight fluid overload which is not unexpected given his need for dialysis.  At this time we will check his state Jude pacemaker defibrillator to see what activity may have occurred that either prompted the syncope or corrected an arrhythmia that may have caused the syncope.  Cardiac monitoring in the ED.  Has had V tach event pre hospital confirmed by [facility] rep He has short run of ventricular tachycardia after he was converted back into sinus rhythm however here he has been in sinus rhythm.  He remains mildly hypotensive.  I have discussed his care with cardiologist, Dr. Allena Katz, who has accepted to see the patient in consultation and request hospital admission given that the patient is on dialysis and needs dialysis within the next 24 hours most likely.  Test with the hospitalist who is in agreement to admit the patient to the hospital.  Transfer the patient to the cardiology capable hospital at [facility], higher level  of  care.  Final Clinical Impressions(s) / ED Diagnoses   Final diagnoses:  Ventricular tachycardia (HCC)  ESRD (end stage renal disease) [facility])    ED Discharge Orders    None       Eber Hong, MD 04/19/2018 2107

## 2018-04-04 NOTE — ED Notes (Signed)
RN stayed with Pt and confirmed transmission of interpreter device.

## 2018-04-04 NOTE — ED Notes (Signed)
Restricted extremity bracelet placed on Pts Right Wrist.

## 2018-04-04 NOTE — Telephone Encounter (Signed)
Called and spoke to patient and made him aware that Dr. Shirlee Latch would like for him to have a RHC next week. Patient receives dialysis on T/TH/Sa. Patient agrees to have cath done on Friday. Patient scheduled with Dr. Shirlee Latch on 04/12/18 at 7:30AM. Patient verbalized all pre-cath instruction below. Patient will keep appointment already scheduled with Dr. Shirlee Latch on 04/26/18 at 9:20 AM.   [facility] [address] [id number] [facility] [place] [address] Dept: [phone] Loc: [phone]  Eric Murillo  04/09/2018  You are scheduled for a Right Cardiac Catheterization on Friday, September 20 with Dr. [name].  1. Please arrive at the [facility] (Main Entrance A) at [facility]: [address] [place], [place] [address] at 5:30 AM (This time is two hours before your procedure to ensure your preparation). Free valet parking service is available.   Special note: Every effort is made to have your procedure done on time. Please understand that emergencies sometimes delay scheduled procedures.  2. Diet: Do not eat solid foods after midnight.  The patient may have clear liquids until 5am upon the day of the procedure.  3. Labs: CBC and BMET within the last 30 days  4. Medication instructions in preparation for your procedure:   Contrast Allergy: No- N/A  HOLD Eliquis the day before and the morning of your procedure  On the morning of your procedure, take your any of your regular morning medicines NOT listed above.  You may use sips of water.  5. Plan for one night stay--bring personal belongings. 6. Bring a current list of your medications and current insurance cards. 7. You MUST have a responsible person to drive you home. 8. Someone MUST be with you the first 24 hours after you arrive home or your discharge will be delayed. 9. Please wear clothes that are easy to get on and off and wear slip-on  shoes.  Thank you for allowing [place] to care for you!   -- [facility]

## 2018-04-04 NOTE — ED Notes (Signed)
ED Provider at bedside. 

## 2018-04-04 NOTE — Progress Notes (Signed)
Remote ICD transmission.   

## 2018-04-04 NOTE — ED Notes (Signed)
Pt provided frozen dinner tray to eat and ice water.

## 2018-04-04 NOTE — ED Notes (Signed)
Called [address] [facility] to verify transmission of data from Interpreter device performed during day shift. Confirmed data had not been received. MD, Notified and RN will repeat interpreter of ICD device

## 2018-04-04 NOTE — H&P (Addendum)
History and Physical    Eric Murillo:[id number] DOB: [birth year] DOA: 04-13-2018  PCP: Synetta Shadow, MD  Patient coming from: Home  I have personally briefly reviewed patient's old medical records in [facility]  Chief Complaint: syncope  HPI: Eric Murillo is a 57 y.o. male with medical history significant of nonischemic cardiomyopathy with EF of 20 to 25%, chronic kidney disease on dialysis, defibrillator placement, presents with syncope.  Patient was at home today.  He got up to let his girlfriend  In door and was walking back to the couch when he had  abrupt syncopal episode.  He states he had no prodromal symptoms.  He has been having tachyarrhythmias recently.  He saw his cardiologist Dr. Graciela Husbands recently who started him mexiline.  He states compliance with his medications his ICD  was evaluated  here in the ED which did show V. tach was coincided with his symptoms earlier during the day.  ED physician spoke to cardiology who recommended admission/transfer to [facility] call for further evaluation.  Patient actually was scheduled for a right heart cath in September.  Patient did go to dialysis today however they were unable to access his fistula and he was told to return the next day.  He denies any chest pain shortness of breath.  ED Course: ED physician Dr. Hyacinth Meeker spoke with cardiologist Dr.CHAKRAVARTTI advised transfer to [facility] for further evaluation.  Review of Systems: Syncope denies any chest pain shortness of breath nausea vomiting All others reviewed with patient  and are  negative unless otherwise stated   Past Medical History:  Diagnosis Date  . AICD (automatic cardioverter/defibrillator) present   . Anemia of chronic disease   . Arthritis   . Atrial flutter (HCC)    DCCV 9/15  . Chest pain on exertion   . Chronic systolic congestive heart failure, NYHA class 2 (HCC)    EF 20-25% 1/16  . CKD (chronic kidney disease) stage 3, GFR 30-59 ml/min (HCC)   .  Dyslipidemia   . Gout    Of big toe  . Hyperglycemia   . Hyperlipidemia   . Hypertension   . Morbid obesity with BMI of 45.0-49.9, adult (HCC)   . Non-ischemic cardiomyopathy (HCC)   . Organic erectile dysfunction   . OSA (obstructive sleep apnea)   . Pilonidal cyst   . Submandibular sialolithiasis    Right    Past Surgical History:  Procedure Laterality Date  . AV FISTULA PLACEMENT Right 11/12/2017   Procedure: CREATION of RIGHT BRACHIAL CEPHALIC ARTERIOVENOUS (AV) FISTULA;  Surgeon: Sherren Kerns, MD;  Location: [facility] OR;  Service: Vascular;  Laterality: Right;  . CARDIAC CATHETERIZATION  2012  . CARDIOVERSION N/A 04/03/2014   Procedure: CARDIOVERSION;  Surgeon: Laurey Morale, MD;  Location: [facility] ENDOSCOPY;  Service: Cardiovascular;  Laterality: N/A;  . CARDIOVERSION N/A 03/09/2016   Procedure: CARDIOVERSION;  Surgeon: Laurey Morale, MD;  Location: [facility] ENDOSCOPY;  Service: Cardiovascular;  Laterality: N/A;  . CARDIOVERSION N/A 06/06/2017   Procedure: CARDIOVERSION;  Surgeon: Laurey Morale, MD;  Location: [facility] ENDOSCOPY;  Service: Cardiovascular;  Laterality: N/A;  . CARDIOVERSION N/A 11/21/2017   Procedure: CARDIOVERSION;  Surgeon: Laurey Morale, MD;  Location: [facility] ENDOSCOPY;  Service: Cardiovascular;  Laterality: N/A;  . COLONOSCOPY    . EP IMPLANTABLE DEVICE N/A 05/21/2015   Procedure: ICD Implant;  Surgeon: Duke Salvia, MD;  Location: [facility] INVASIVE CV LAB;  Service: Cardiovascular;  Laterality: N/A;  . INSERTION OF  DIALYSIS CATHETER Right 11/12/2017   Procedure: PLACEMENT of right internal jugualr tunneled DIALYSIS CATHETER with removal or temporary dialysis catheter;  Surgeon: Sherren Kerns, MD;  Location: [facility] OR;  Service: Vascular;  Laterality: Right;  . IR FLUORO GUIDE CV LINE RIGHT  11/08/2017  . IR US GUIDE VASC ACCESS RIGHT  11/08/2017  . LEFT AND RIGHT HEART CATHETERIZATION WITH CORONARY ANGIOGRAM N/A 04/24/2014   Procedure: LEFT AND RIGHT HEART CATHETERIZATION WITH  CORONARY ANGIOGRAM;  Surgeon: Laurey Morale, MD;  Location: [facility] CATH LAB;  Service: Cardiovascular;  Laterality: N/A;  . SALIVARY GLAND SURGERY  09/12/2012  . SUBMANDIBULAR GLAND EXCISION Right 09/12/2012   Procedure: Removal Right Submandibular Larina Bras;  Surgeon: Serena Colonel, MD;  Location: [facility] OR;  Service: ENT;  Laterality: Right;  . TEE WITHOUT CARDIOVERSION N/A 04/03/2014   Procedure: TRANSESOPHAGEAL ECHOCARDIOGRAM (TEE);  Surgeon: Laurey Morale, MD;  Location: [facility] ENDOSCOPY;  Service: Cardiovascular;  Laterality: N/A;  . TEE WITHOUT CARDIOVERSION N/A 03/09/2016   Procedure: TRANSESOPHAGEAL ECHOCARDIOGRAM (TEE);  Surgeon: Laurey Morale, MD;  Location: [facility] ENDOSCOPY;  Service: Cardiovascular;  Laterality: N/A;  . TEE WITHOUT CARDIOVERSION N/A 06/06/2017   Procedure: TRANSESOPHAGEAL ECHOCARDIOGRAM (TEE);  Surgeon: Laurey Morale, MD;  Location: [facility] ENDOSCOPY;  Service: Cardiovascular;  Laterality: N/A;  . TEE WITHOUT CARDIOVERSION N/A 11/21/2017   Procedure: TRANSESOPHAGEAL ECHOCARDIOGRAM (TEE);  Surgeon: Laurey Morale, MD;  Location: [facility] ENDOSCOPY;  Service: Cardiovascular;  Laterality: N/A;     reports that he has never smoked. He has never used smokeless tobacco. He reports that he does not drink alcohol or use drugs.  Allergies  Allergen Reactions  . Nsaids Other (See Comments)    Cannot take to due kidney issues  . Beta Adrenergic Blockers Other (See Comments)    An unnamed, white-colored Beta Blocker made him "feel funny" = made him feel "cagey"    Family History  Problem Relation Age of Onset  . Hypertension Mother   . Hypertension Father   . Cancer Father        brother died of brain cancer; sister died of bone cancer  . Diabetes Sister   . Diabetes Brother   . Colon cancer Maternal Aunt   . Cardiomyopathy Neg Hx     Prior to Admission medications   Medication Sig Start Date End Date Taking? Authorizing Provider  allopurinol (ZYLOPRIM) 100 MG tablet TAKE 1 TABLET BY  MOUTH TWO TIMES DAILY Patient taking differently: Take 100 mg by mouth 2 (two) times daily.  02/07/18  Yes Inez Catalina, MD  amiodarone (PACERONE) 200 MG tablet Take 1 tablet (200 mg total) by mouth 2 (two) times daily. 04/03/18  Yes Duke Salvia, MD  amoxicillin (AMOXIL) 500 MG capsule Take 500 mg by mouth 3 (three) times daily. 7 DAY COURSE STARTING ON 03/29/2018   Yes [provider]  apixaban (ELIQUIS) 2.5 MG TABS tablet Take 1 tablet (2.5 mg total) by mouth 2 (two) times daily. 03/13/18  Yes Levert Feinstein, MD  bismuth subsalicylate (PEPTO BISMOL) 262 MG chewable tablet Chew 524 mg by mouth daily as needed for indigestion.   Yes [provider]  calcitRIOL (ROCALTROL) 0.5 MCG capsule Take 1 capsule (0.5 mcg total) by mouth every other day. Patient taking differently: Take 0.5 mcg by mouth Every Tuesday,Thursday,and Saturday with dialysis.  11/24/17  Yes Alford Highland, NP  calcium acetate (PHOSLO) 667 MG capsule TAKE 2 CAPSULES (1,334 MG TOTAL) BY MOUTH 3 (THREE) TIMES DAILY  WITH MEALS. 03/14/18  Yes Levert Feinstein, MD  carvedilol (COREG) 3.125 MG tablet Take 1 tablet (3.125 mg total) by mouth 2 (two) times daily with a meal. Give ONLY on non-HD days. Hold on dialysis days. 11/22/17  Yes Alford Highland, NP  diclofenac sodium (VOLTAREN) 1 % GEL Apply 4 g topically 4 (four) times daily. Patient taking differently: Apply 4 g topically 4 (four) times daily as needed (for pain at affected sites).  02/07/17  Yes Darreld Mclean, MD  HYDROcodone-acetaminophen (NORCO/VICODIN) 5-325 MG tablet Take 1-2 tablets by mouth every 4 (four) hours as needed. 03/02/18  Yes Ivery Quale, PA-C  mexiletine (MEXITIL) 200 MG capsule Take 1 capsule (200 mg total) by mouth 2 (two) times daily. 04/03/18  Yes Duke Salvia, MD  multivitamin (RENA-VIT) TABS tablet Take 1 tablet by mouth at bedtime. 11/22/17  Yes Alford Highland, NP  clindamycin (CLEOCIN) 300 MG capsule Take 1 capsule (300 mg total)  by mouth 3 (three) times daily. Patient not taking: Reported on 04/03/2018 03/02/18   Ivery Quale, PA-C    Physical Exam: Vitals:   04/13/2018 2000 04/17/2018 2030 03/31/2018 2100 03/29/2018 2200  BP: 95/65 (!) 84/67 101/78 106/67  Pulse:  83 84 86  Resp: 16 19 19  (!) 21  Temp:      TempSrc:      SpO2:  99% 100% 95%  Weight:      Height:        Constitutional: NAD, calm, comfortable Vitals:   04/14/2018 2000 04/16/2018 2030 04/20/2018 2100 03/26/2018 2200  BP: 95/65 (!) 84/67 101/78 106/67  Pulse:  83 84 86  Resp: 16 19 19  (!) 21  Temp:      TempSrc:      SpO2:  99% 100% 95%  Weight:      Height:       Eyes: PERRL, lids and conjunctivae normal ENMT: Mucous membranes are moist. Posterior pharynx clear of any exudate or lesions.Normal dentition.  Neck: normal, supple, no masses, no thyromegaly Respiratory: clear to auscultation bilaterally, no wheezing, no crackles. Normal respiratory effort. No accessory muscle use.  Cardiovascular: Regular rate and rhythm, no murmurs / rubs / gallops.  1+ chronic lower extremity extremity edema. . Fistula with palpable thrill right arm Abdomen: no tenderness, no masses palpated. No hepatosplenomegaly. Bowel sounds positive.  Obese Musculoskeletal: no clubbing / cyanosis. No joint deformity upper and lower extremities.  Skin: Chronic venous stasis changes to lower extremities bilaterally Neurologic: CN 2-12 grossly intact. Strength 5/5 in all 4.  Psychiatric: Normal judgment and insight. Alert and oriented x 3. Normal mood.   Labs on Admission: I have personally reviewed following labs and imaging studies  CBC: Recent Labs  Lab 04/06/2018 1833  WBC 10.0  HGB 11.4*  HCT 36.5*  MCV 95.3  PLT 191   Basic Metabolic Panel: Recent Labs  Lab 04/03/18 1458 03/31/2018 1833  NA 140 136  K 4.2 3.5  CL 92* 96*  CO2 25 29  GLUCOSE 89 108*  BUN 38* 44*  CREATININE 6.46* 6.94*  CALCIUM 9.5 8.8*   GFR: Estimated Creatinine Clearance: 18.5 mL/min (A)  (by C-G formula based on SCr of 6.94 mg/dL (H)). Liver Function Tests: No results for input(s): AST, ALT, ALKPHOS, BILITOT, PROT, ALBUMIN in the last 168 hours. No results for input(s): LIPASE, AMYLASE in the last 168 hours. No results for input(s): AMMONIA in the last 168 hours. Coagulation Profile: No results for input(s): INR, PROTIME in the last  168 hours. Cardiac Enzymes: No results for input(s): CKTOTAL, CKMB, CKMBINDEX, TROPONINI in the last 168 hours. BNP (last 3 results) No results for input(s): PROBNP in the last 8760 hours. HbA1C: No results for input(s): HGBA1C in the last 72 hours. CBG: No results for input(s): GLUCAP in the last 168 hours. Lipid Profile: No results for input(s): CHOL, HDL, LDLCALC, TRIG, CHOLHDL, LDLDIRECT in the last 72 hours. Thyroid Function Tests: No results for input(s): TSH, T4TOTAL, FREET4, T3FREE, THYROIDAB in the last 72 hours. Anemia Panel: No results for input(s): VITAMINB12, FOLATE, FERRITIN, TIBC, IRON, RETICCTPCT in the last 72 hours. Urine analysis:    Component Value Date/Time   COLORURINE YELLOW 06/05/2017 1510   APPEARANCEUR CLEAR 06/05/2017 1510   LABSPEC 1.011 06/05/2017 1510   PHURINE 5.0 06/05/2017 1510   GLUCOSEU NEGATIVE 06/05/2017 1510   HGBUR NEGATIVE 06/05/2017 1510   BILIRUBINUR NEGATIVE 06/05/2017 1510   KETONESUR NEGATIVE 06/05/2017 1510   PROTEINUR NEGATIVE 06/05/2017 1510   NITRITE NEGATIVE 06/05/2017 1510   LEUKOCYTESUR NEGATIVE 06/05/2017 1510    Radiological Exams on Admission: Dg Chest Port 1 View  Result Date: 04/14/2018 CLINICAL DATA:  Patient states he has had syncopal episodes today and jerked and felt like his ICD went off. Hx of AICD, atrial flutter, HTN, and cardiac cath. EXAM: PORTABLE CHEST 1 VIEW COMPARISON:  Chest x-ray dated 03/26/2018. FINDINGS: Stable cardiomegaly. Lungs are clear. No pleural effusion or pneumothorax. LEFT chest wall pacemaker apparatus appears stable. Osseous structures about the  chest are unremarkable. IMPRESSION: No active disease.  Stable cardiomegaly. Electronically Signed   By: Bary Richard M.D.   On: 04/13/2018 20:32     Assessment/Plan Principal Problem:   V-tach [facility]) Active Problems:   Chronic systolic heart failure (HCC)   Cardiomyopathy, nonischemic (HCC)   Long term current use of anticoagulant therapy   ESRD (end stage renal disease) on dialysis [facility])   Syncope, cardiogenic    -Inpatient admission to stepdown unit at [facility].  Per cardiology consultation they recommend lidocaine push and drip if recurrent V. tach.  Await any further recommendations from cardiology.  Cont Eliquis   -Patient missed dialysis today as they were unable to access his fistula.  Will need nephrology consultation for dialysis appears euvolemic at this time    DVT prophylaxis: Eliquis  Code Status: Full  Disposition Plan: disCharge home 2 to 3 days Consults called: Cardiology Dr Allena Katz . Admission status: Inpatient stepdown unit  Per evaluation patient will require inpatient admission past 2 midnights for stepdown admission, cardiology consultation, possible right heart cath with adjustments of medications for treatment of recurring V. tach  Maisey Deandrade Johnson-Pitts MD [facility] Pager [phone]  If 7PM-7AM, please contact night-coverage [email or web address] Password [facility]  03/31/2018, 10:49 PM

## 2018-04-04 NOTE — ED Triage Notes (Signed)
Pt is having spells were he is passing out today he jerked liked his icd went off

## 2018-04-05 DIAGNOSIS — I472 Ventricular tachycardia: Principal | ICD-10-CM

## 2018-04-05 DIAGNOSIS — I5022 Chronic systolic (congestive) heart failure: Secondary | ICD-10-CM

## 2018-04-05 DIAGNOSIS — N186 End stage renal disease: Secondary | ICD-10-CM

## 2018-04-05 DIAGNOSIS — I429 Cardiomyopathy, unspecified: Secondary | ICD-10-CM

## 2018-04-05 DIAGNOSIS — Z992 Dependence on renal dialysis: Secondary | ICD-10-CM

## 2018-04-05 LAB — CUP PACEART INCLINIC DEVICE CHECK
Battery Remaining Longevity: 76 mo
Brady Statistic RV Percent Paced: 0 %
HIGH POWER IMPEDANCE MEASURED VALUE: 83 Ohm
Implantable Lead Location: 753860
Implantable Pulse Generator Implant Date: 20161028
Lead Channel Setting Pacing Amplitude: 2.5 V
Lead Channel Setting Pacing Pulse Width: 0.5 ms
MDC IDC LEAD IMPLANT DT: 20161028
MDC IDC LEAD SERIAL: 332395
MDC IDC MSMT LEADCHNL RV IMPEDANCE VALUE: 450 Ohm
MDC IDC MSMT LEADCHNL RV PACING THRESHOLD AMPLITUDE: 0.75 V
MDC IDC MSMT LEADCHNL RV PACING THRESHOLD PULSEWIDTH: 0.5 ms
MDC IDC MSMT LEADCHNL RV SENSING INTR AMPL: 12 mV
MDC IDC SESS DTM: 20190911182841
MDC IDC SET LEADCHNL RV SENSING SENSITIVITY: 0.5 mV
Pulse Gen Serial Number: 7305918

## 2018-04-05 LAB — BASIC METABOLIC PANEL
ANION GAP: 15 (ref 5–15)
BUN: 52 mg/dL — ABNORMAL HIGH (ref 6–20)
CO2: 26 mmol/L (ref 22–32)
Calcium: 8.7 mg/dL — ABNORMAL LOW (ref 8.9–10.3)
Chloride: 98 mmol/L (ref 98–111)
Creatinine, Ser: 6.13 mg/dL — ABNORMAL HIGH (ref 0.61–1.24)
GFR calc Af Amer: 11 mL/min — ABNORMAL LOW (ref >60)
GFR, EST NON AFRICAN AMERICAN: 9 mL/min — AB (ref >60)
Glucose, Bld: 100 mg/dL — ABNORMAL HIGH (ref 70–99)
POTASSIUM: 3.9 mmol/L (ref 3.5–5.1)
Sodium: 139 mmol/L (ref 135–145)

## 2018-04-05 LAB — GLUCOSE, CAPILLARY: Glucose-Capillary: 111 mg/dL — ABNORMAL HIGH (ref 70–99)

## 2018-04-05 LAB — CBC
HCT: 34.3 % — ABNORMAL LOW (ref 39.0–52.0)
HEMOGLOBIN: 10.7 g/dL — AB (ref 13.0–17.0)
MCH: 29.8 pg (ref 26.0–34.0)
MCHC: 31.2 g/dL (ref 30.0–36.0)
MCV: 95.5 fL (ref 78.0–100.0)
Platelets: 202 10*3/uL (ref 150–400)
RBC: 3.59 MIL/uL — ABNORMAL LOW (ref 4.22–5.81)
RDW: 14.2 % (ref 11.5–15.5)
WBC: 9 10*3/uL (ref 4.0–10.5)

## 2018-04-05 LAB — MRSA PCR SCREENING: MRSA by PCR: NEGATIVE

## 2018-04-05 LAB — MAGNESIUM: MAGNESIUM: 2.5 mg/dL — AB (ref 1.7–2.4)

## 2018-04-05 LAB — APTT: APTT: 35 s (ref 24–36)

## 2018-04-05 MED ORDER — CARVEDILOL 3.125 MG PO TABS
3.1250 mg | ORAL_TABLET | ORAL | Status: DC
  Administered 2018-04-05: 3.125 mg via ORAL
  Filled 2018-04-05: qty 1

## 2018-04-05 MED ORDER — LIDOCAINE IN D5W 4-5 MG/ML-% IV SOLN
1.0000 mg/min | INTRAVENOUS | Status: DC
  Administered 2018-04-05 – 2018-04-06 (×2): 1 mg/min via INTRAVENOUS
  Filled 2018-04-05 (×2): qty 500

## 2018-04-05 MED ORDER — APIXABAN 2.5 MG PO TABS
2.5000 mg | ORAL_TABLET | Freq: Two times a day (BID) | ORAL | Status: DC
  Administered 2018-04-05 (×2): 2.5 mg via ORAL
  Filled 2018-04-05 (×2): qty 1

## 2018-04-05 MED ORDER — AMIODARONE HCL IN DEXTROSE 360-4.14 MG/200ML-% IV SOLN
30.0000 mg/h | INTRAVENOUS | Status: DC
  Administered 2018-04-06 – 2018-04-08 (×4): 30 mg/h via INTRAVENOUS
  Filled 2018-04-05 (×4): qty 200

## 2018-04-05 MED ORDER — AMOXICILLIN 500 MG PO CAPS: 500.0000 mg | ORAL_CAPSULE | Freq: Three times a day (TID) | ORAL | Status: DC

## 2018-04-05 MED ORDER — LIDOCAINE BOLUS VIA INFUSION
100.0000 mg | Freq: Once | INTRAVENOUS | Status: AC
  Administered 2018-04-05: 100 mg via INTRAVENOUS
  Filled 2018-04-05: qty 100

## 2018-04-05 MED ORDER — RENA-VITE PO TABS
1.0000 | ORAL_TABLET | Freq: Every day | ORAL | Status: DC
  Administered 2018-04-05 – 2018-04-22 (×18): 1 via ORAL
  Filled 2018-04-05 (×18): qty 1

## 2018-04-05 MED ORDER — PROMETHAZINE HCL 25 MG PO TABS: 12.5000 mg | ORAL_TABLET | Freq: Four times a day (QID) | ORAL | Status: DC | PRN

## 2018-04-05 MED ORDER — ACETAMINOPHEN 325 MG PO TABS: 650.0000 mg | ORAL_TABLET | Freq: Four times a day (QID) | ORAL | Status: DC | PRN

## 2018-04-05 MED ORDER — SENNOSIDES-DOCUSATE SODIUM 8.6-50 MG PO TABS: 1.0000 | ORAL_TABLET | Freq: Every evening | ORAL | Status: DC | PRN

## 2018-04-05 MED ORDER — BISMUTH SUBSALICYLATE 262 MG PO CHEW
524.0000 mg | CHEWABLE_TABLET | Freq: Every day | ORAL | Status: DC | PRN
  Administered 2018-04-07 – 2018-04-09 (×2): 524 mg via ORAL
  Filled 2018-04-05 (×4): qty 2

## 2018-04-05 MED ORDER — CALCIUM ACETATE (PHOS BINDER) 667 MG PO CAPS
1334.0000 mg | ORAL_CAPSULE | Freq: Three times a day (TID) | ORAL | Status: DC
  Administered 2018-04-05 – 2018-04-14 (×26): 1334 mg via ORAL
  Filled 2018-04-05 (×26): qty 2

## 2018-04-05 MED ORDER — AMIODARONE HCL IN DEXTROSE 360-4.14 MG/200ML-% IV SOLN
60.0000 mg/h | INTRAVENOUS | Status: AC
  Administered 2018-04-05 – 2018-04-06 (×2): 60 mg/h via INTRAVENOUS
  Filled 2018-04-05 (×2): qty 200

## 2018-04-05 MED ORDER — ALLOPURINOL 100 MG PO TABS
100.0000 mg | ORAL_TABLET | Freq: Two times a day (BID) | ORAL | Status: DC
  Administered 2018-04-05 – 2018-04-23 (×38): 100 mg via ORAL
  Filled 2018-04-05 (×38): qty 1

## 2018-04-05 MED ORDER — HYDROCODONE-ACETAMINOPHEN 5-325 MG PO TABS
1.0000 | ORAL_TABLET | ORAL | Status: DC | PRN
  Administered 2018-04-11 (×2): 1 via ORAL
  Filled 2018-04-05 (×2): qty 1

## 2018-04-05 MED ORDER — CHLORHEXIDINE GLUCONATE CLOTH 2 % EX PADS
6.0000 | MEDICATED_PAD | Freq: Every day | CUTANEOUS | Status: DC
  Administered 2018-04-06 – 2018-04-08 (×3): 6 via TOPICAL

## 2018-04-05 MED ORDER — AMIODARONE HCL 200 MG PO TABS
200.0000 mg | ORAL_TABLET | Freq: Two times a day (BID) | ORAL | Status: DC
  Administered 2018-04-05 (×2): 200 mg via ORAL
  Filled 2018-04-05 (×2): qty 1

## 2018-04-05 MED ORDER — HEPARIN (PORCINE) IN NACL 100-0.45 UNIT/ML-% IJ SOLN
1250.0000 [IU]/h | INTRAMUSCULAR | Status: DC
  Administered 2018-04-05 – 2018-04-07 (×3): 1250 [IU]/h via INTRAVENOUS
  Filled 2018-04-05 (×3): qty 250

## 2018-04-05 MED ORDER — SODIUM CHLORIDE 0.9% FLUSH
3.0000 mL | Freq: Two times a day (BID) | INTRAVENOUS | Status: DC
  Administered 2018-04-05 – 2018-04-06 (×5): 3 mL via INTRAVENOUS
  Administered 2018-04-07: 21:00:00 via INTRAVENOUS
  Administered 2018-04-07 – 2018-04-21 (×17): 3 mL via INTRAVENOUS

## 2018-04-05 MED ORDER — ACETAMINOPHEN 650 MG RE SUPP: 650.0000 mg | Freq: Four times a day (QID) | RECTAL | Status: DC | PRN

## 2018-04-05 MED ORDER — CALCITRIOL 0.25 MCG PO CAPS
0.5000 ug | ORAL_CAPSULE | ORAL | Status: DC
  Administered 2018-04-06 – 2018-04-18 (×5): 0.5 ug via ORAL
  Filled 2018-04-05 (×5): qty 2

## 2018-04-05 NOTE — Consult Note (Addendum)
Cardiology Consultation Note    Patient ID: Eric Murillo, MRN: [id number], DOB/AGE: [birth year] 57 y.o. Admit date: 04/05/2018   Date of Consult: 04/05/2018 Primary Physician: Synetta Shadow, MD Primary Cardiologist: Dr. Shirlee Latch  Reason for Consultation: ICD Shock Requesting MD: Dr. Hyacinth Meeker  HPI: [name] is a 57 y.o. male with a history of nonischemic cardiomyopathy (EF 20 to 25%, Saint Jude ICD in place), VT on home amiodarone, end-stage renal disease on HD, who presents following a syncopal episode and ICD shock.  The patient recently has had episodes of wide-complex tachycardia for which he was cardioverted in the ED (9/3), that were ultimately thought to be VT below the detection zone.  He saw Dr. Graciela Husbands on the day prior to admission, and mexiletine 200 mg twice daily was added to his home regimen.  He took 1 dose of this in the afternoon prior to presentation.  Later in the afternoon, he had an unheralded syncopal episode, and quickly returned back to his baseline.  Otherwise, he has felt fine.  He denies any chest pains or shortness of breath.  Of note, he was unable to have his dialysis session yesterday, because of inability to access his fistula.    He presented to the [facility] ED for further evaluation.  There, he was mildly hypotensive on presentation with blood pressures in the 80s over 60s.  The remainder of his vital signs were within normal limits.  His labs were notable for a BUN and creatinine consistent with his known end-stage renal disease.  Potassium was 3.5, his CBC was within normal limits.  He had a point-of-care troponin I 0.02.  ICD interrogation revealed appropriate shock for monomorphic VT.  He remained in sinus rhythm while he was monitored at [facility].  Shortly after arrival to [facility], he had another episode of monomorphic VT, that was ultimately terminated successfully by [facility].  Upon my interview, the patient reported that he felt lightheaded with this  episode, although did not have frank syncope.  Otherwise, he denies any focal complaints.   Past Medical History:  Diagnosis Date  . AICD (automatic cardioverter/defibrillator) present   . Anemia of chronic disease   . Arthritis   . Atrial flutter (HCC)    DCCV 9/15  . Chest pain on exertion   . Chronic systolic congestive heart failure, NYHA class 2 (HCC)    EF 20-25% 1/16  . CKD (chronic kidney disease) stage 3, GFR 30-59 ml/min (HCC)   . Dyslipidemia   . Gout    Of big toe  . Hyperglycemia   . Hyperlipidemia   . Hypertension   . Morbid obesity with BMI of 45.0-49.9, adult (HCC)   . Non-ischemic cardiomyopathy (HCC)   . Organic erectile dysfunction   . OSA (obstructive sleep apnea)   . Pilonidal cyst   . Submandibular sialolithiasis    Right      Surgical History:  Past Surgical History:  Procedure Laterality Date  . AV FISTULA PLACEMENT Right 11/12/2017   Procedure: CREATION of RIGHT BRACHIAL CEPHALIC ARTERIOVENOUS (AV) FISTULA;  Surgeon: Sherren Kerns, MD;  Location: [facility] OR;  Service: Vascular;  Laterality: Right;  . CARDIAC CATHETERIZATION  2012  . CARDIOVERSION N/A 04/03/2014   Procedure: CARDIOVERSION;  Surgeon: Laurey Morale, MD;  Location: [facility] ENDOSCOPY;  Service: Cardiovascular;  Laterality: N/A;  . CARDIOVERSION N/A 03/09/2016   Procedure: CARDIOVERSION;  Surgeon: Laurey Morale, MD;  Location: [facility] ENDOSCOPY;  Service: Cardiovascular;  Laterality: N/A;  .  CARDIOVERSION N/A 06/06/2017   Procedure: CARDIOVERSION;  Surgeon: Laurey Morale, MD;  Location: [facility] ENDOSCOPY;  Service: Cardiovascular;  Laterality: N/A;  . CARDIOVERSION N/A 11/21/2017   Procedure: CARDIOVERSION;  Surgeon: Laurey Morale, MD;  Location: [facility] ENDOSCOPY;  Service: Cardiovascular;  Laterality: N/A;  . COLONOSCOPY    . EP IMPLANTABLE DEVICE N/A 05/21/2015   Procedure: ICD Implant;  Surgeon: Duke Salvia, MD;  Location: [facility] INVASIVE CV LAB;  Service: Cardiovascular;  Laterality: N/A;  .  INSERTION OF DIALYSIS CATHETER Right 11/12/2017   Procedure: PLACEMENT of right internal jugualr tunneled DIALYSIS CATHETER with removal or temporary dialysis catheter;  Surgeon: Sherren Kerns, MD;  Location: [facility] OR;  Service: Vascular;  Laterality: Right;  . IR FLUORO GUIDE CV LINE RIGHT  11/08/2017  . IR US GUIDE VASC ACCESS RIGHT  11/08/2017  . LEFT AND RIGHT HEART CATHETERIZATION WITH CORONARY ANGIOGRAM N/A 04/24/2014   Procedure: LEFT AND RIGHT HEART CATHETERIZATION WITH CORONARY ANGIOGRAM;  Surgeon: Laurey Morale, MD;  Location: [facility] CATH LAB;  Service: Cardiovascular;  Laterality: N/A;  . SALIVARY GLAND SURGERY  09/12/2012  . SUBMANDIBULAR GLAND EXCISION Right 09/12/2012   Procedure: Removal Right Submandibular Larina Bras;  Surgeon: Serena Colonel, MD;  Location: [facility] OR;  Service: ENT;  Laterality: Right;  . TEE WITHOUT CARDIOVERSION N/A 04/03/2014   Procedure: TRANSESOPHAGEAL ECHOCARDIOGRAM (TEE);  Surgeon: Laurey Morale, MD;  Location: [facility] ENDOSCOPY;  Service: Cardiovascular;  Laterality: N/A;  . TEE WITHOUT CARDIOVERSION N/A 03/09/2016   Procedure: TRANSESOPHAGEAL ECHOCARDIOGRAM (TEE);  Surgeon: Laurey Morale, MD;  Location: [facility] ENDOSCOPY;  Service: Cardiovascular;  Laterality: N/A;  . TEE WITHOUT CARDIOVERSION N/A 06/06/2017   Procedure: TRANSESOPHAGEAL ECHOCARDIOGRAM (TEE);  Surgeon: Laurey Morale, MD;  Location: [facility] ENDOSCOPY;  Service: Cardiovascular;  Laterality: N/A;  . TEE WITHOUT CARDIOVERSION N/A 11/21/2017   Procedure: TRANSESOPHAGEAL ECHOCARDIOGRAM (TEE);  Surgeon: Laurey Morale, MD;  Location: [facility] ENDOSCOPY;  Service: Cardiovascular;  Laterality: N/A;     Home Meds: Prior to Admission medications   Medication Sig Start Date End Date Taking? Authorizing Provider  allopurinol (ZYLOPRIM) 100 MG tablet TAKE 1 TABLET BY MOUTH TWO TIMES DAILY Patient taking differently: Take 100 mg by mouth 2 (two) times daily.  02/07/18  Yes Inez Catalina, MD  amiodarone (PACERONE) 200 MG tablet Take  1 tablet (200 mg total) by mouth 2 (two) times daily. 04/03/18  Yes Duke Salvia, MD  amoxicillin (AMOXIL) 500 MG capsule Take 500 mg by mouth 3 (three) times daily. 7 DAY COURSE STARTING ON 03/29/2018   Yes [provider]  apixaban (ELIQUIS) 2.5 MG TABS tablet Take 1 tablet (2.5 mg total) by mouth 2 (two) times daily. 03/13/18  Yes Levert Feinstein, MD  bismuth subsalicylate (PEPTO BISMOL) 262 MG chewable tablet Chew 524 mg by mouth daily as needed for indigestion.   Yes [provider]  calcitRIOL (ROCALTROL) 0.5 MCG capsule Take 1 capsule (0.5 mcg total) by mouth every other day. Patient taking differently: Take 0.5 mcg by mouth Every Tuesday,Thursday,and Saturday with dialysis.  11/24/17  Yes Alford Highland, NP  calcium acetate (PHOSLO) 667 MG capsule TAKE 2 CAPSULES (1,334 MG TOTAL) BY MOUTH 3 (THREE) TIMES DAILY WITH MEALS. 03/14/18  Yes Levert Feinstein, MD  carvedilol (COREG) 3.125 MG tablet Take 1 tablet (3.125 mg total) by mouth 2 (two) times daily with a meal. Give ONLY on non-HD days. Hold on dialysis days. 11/22/17  Yes Alford Highland, NP  diclofenac sodium (VOLTAREN) 1 % GEL Apply 4 g topically 4 (four) times daily. Patient taking differently: Apply 4 g topically 4 (four) times daily as needed (for pain at affected sites).  02/07/17  Yes Darreld Mclean, MD  HYDROcodone-acetaminophen (NORCO/VICODIN) 5-325 MG tablet Take 1-2 tablets by mouth every 4 (four) hours as needed. 03/02/18  Yes Ivery Quale, PA-C  mexiletine (MEXITIL) 200 MG capsule Take 1 capsule (200 mg total) by mouth 2 (two) times daily. 04/03/18  Yes Duke Salvia, MD  multivitamin (RENA-VIT) TABS tablet Take 1 tablet by mouth at bedtime. 11/22/17  Yes Alford Highland, NP  clindamycin (CLEOCIN) 300 MG capsule Take 1 capsule (300 mg total) by mouth 3 (three) times daily. Patient not taking: Reported on 03/26/2018 03/02/18   Ivery Quale, PA-C    Inpatient Medications:  . allopurinol  100 mg Oral BID  .  apixaban  2.5 mg Oral BID  . [START ON 04/06/2018] calcitRIOL  0.5 mcg Oral Q T,Th,Sa-HD  . calcium acetate  1,334 mg Oral TID WC  . carvedilol  3.125 mg Oral 2 times per day on Sun Mon Wed Fri  . multivitamin  1 tablet Oral QHS  . sodium chloride flush  3 mL Intravenous Q12H     Allergies:  Allergies  Allergen Reactions  . Nsaids Other (See Comments)    Cannot take to due kidney issues  . Beta Adrenergic Blockers Other (See Comments)    An unnamed, white-colored Beta Blocker made him "feel funny" = made him feel "cagey"    Social History   Socioeconomic History  . Marital status: Single    Spouse name: Not on file  . Number of children: Not on file  . Years of education: Not on file  . Highest education level: Not on file  Occupational History  . Not on file  Social Needs  . Financial resource strain: Not on file  . Food insecurity:    Worry: Not on file    Inability: Not on file  . Transportation needs:    Medical: Not on file    Non-medical: Not on file  Tobacco Use  . Smoking status: Never Smoker  . Smokeless tobacco: Never Used  Substance and Sexual Activity  . Alcohol use: No    Alcohol/week: 0.0 standard drinks  . Drug use: No  . Sexual activity: Not on file  Lifestyle  . Physical activity:    Days per week: Not on file    Minutes per session: Not on file  . Stress: Not on file  Relationships  . Social connections:    Talks on phone: Not on file    Gets together: Not on file    Attends religious service: Not on file    Active member of club or organization: Not on file    Attends meetings of clubs or organizations: Not on file    Relationship status: Not on file  . Intimate partner violence:    Fear of current or ex partner: Not on file    Emotionally abused: Not on file    Physically abused: Not on file    Forced sexual activity: Not on file  Other Topics Concern  . Not on file  Social History Narrative   Financial assistance approved for 100%  discount at [facility] and has [facility] card   [employer]  March 16, 2010 9:42 AM      Lives with mother.  Has a girlfriend     Family  History  Problem Relation Age of Onset  . Hypertension Mother   . Hypertension Father   . Cancer Father        brother died of brain cancer; sister died of bone cancer  . Diabetes Sister   . Diabetes Brother   . Colon cancer Maternal Aunt   . Cardiomyopathy Neg Hx      Review of Systems: All other systems reviewed and are otherwise negative except as noted above.  Labs: No results for input(s): CKTOTAL, CKMB, TROPONINI in the last 72 hours. Lab Results  Component Value Date   WBC 10.0 04/19/2018   HGB 11.4 (L) 04/22/2018   HCT 36.5 (L) 04/01/2018   MCV 95.3 04/06/2018   PLT 191 04/21/2018    Recent Labs  Lab 04/13/2018 1833  NA 136  K 3.5  CL 96*  CO2 29  BUN 44*  CREATININE 6.94*  CALCIUM 8.8*  GLUCOSE 108*   Lab Results  Component Value Date   CHOL 166 03/26/2018   HDL 37 (L) 03/26/2018   LDLCALC 101 (H) 03/26/2018   TRIG 138 03/26/2018   No results found for: DDIMER  Radiology/Studies:  Dg Chest Port 1 View  Result Date: 04/21/2018 CLINICAL DATA:  Patient states he has had syncopal episodes today and jerked and felt like his ICD went off. Hx of AICD, atrial flutter, HTN, and cardiac cath. EXAM: PORTABLE CHEST 1 VIEW COMPARISON:  Chest x-ray dated 03/26/2018. FINDINGS: Stable cardiomegaly. Lungs are clear. No pleural effusion or pneumothorax. LEFT chest wall pacemaker apparatus appears stable. Osseous structures about the chest are unremarkable. IMPRESSION: No active disease.  Stable cardiomegaly. Electronically Signed   By: Bary Richard M.D.   On: 04/16/2018 20:32   Dg Chest Portable 1 View  Result Date: 03/26/2018 CLINICAL DATA:  Shortness of breath and weakness while watching television tonight, now resolved. History of end-stage renal disease on dialysis, CHF. EXAM: PORTABLE CHEST 1 VIEW COMPARISON:  Chest radiograph January 01, 2018 FINDINGS: Stable cardiomegaly. Low inspiratory examination. Pulmonary vasculature appears normal. Mildly elevated RIGHT hemidiaphragm without pleural effusion or focal consolidation. RIGHT costophrenic angle incompletely imaged. No pneumothorax. LEFT AICD in situ. Stable appearance of tunneled dialysis catheter via RIGHT internal jugular venous approach with distal tip projecting in distal superior vena cava. Soft tissue planes and included osseous structures are unchanged. IMPRESSION: Stable cardiomegaly.  No acute pulmonary process. Electronically Signed   By: Awilda Metro M.D.   On: 03/26/2018 05:35    Wt Readings from Last 3 Encounters:  04/05/18 124.6 kg  04/03/18 125 kg  03/20/18 126.2 kg    EKG: Sinus rhythm, IVCD  Physical Exam: Blood pressure 110/76, pulse 92, temperature 98.5 F (36.9 C), temperature source Oral, resp. rate 20, height 5\' 9"  (1.753 m), weight 124.6 kg, SpO2 (!) 74 %. Body mass index is 40.57 kg/m. General: Well developed, well nourished, in no acute distress. Head: Normocephalic, atraumatic, sclera non-icteric, no xanthomas, nares are without discharge.  Neck: Negative for carotid bruits. JVD 12 Lungs: Clear bilaterally to auscultation without wheezes, rales, or rhonchi. Breathing is unlabored. Heart: RRR with S1 S2. No murmurs, rubs, or gallops appreciated. Abdomen: Soft, non-tender, non-distended with normoactive bowel sounds. No hepatomegaly. No rebound/guarding. No obvious abdominal masses. Msk:  Strength and tone appear normal for age. Extremities: No clubbing or cyanosis.  2+ edema in the lower extremities bilaterally.  Distal pedal pulses are 2+ and equal bilaterally. Neuro: Alert and oriented X 3. No facial asymmetry. No focal deficit.  Moves all extremities spontaneously. Psych:  Responds to questions appropriately with a normal affect.     Assessment and Plan  57 year old man with a nonischemic cardiomyopathy, VT, and end-stage renal disease  on HD, who presents with an appropriate ICD shock for VT, which was accompanied by a syncopal episode.  Given that the patient had another episode of hemodynamically significant VT upon arrival to [facility] that was successfully terminated with ATP, it would be reasonable to bolus 100 mg of IV lidocaine and start him on a lidocaine drip at this point.  Hopefully this will be a short course of IV lidocaine, and he can resume his mexiletine shortly.  He has been followed by the advanced heart failure team.  Is unclear whether he is a candidate for any advanced therapies at this point; his end-stage renal disease certainly makes advanced options more challenging.  Would consider performing right heart cath while in-house to better define hemodynamics and filling pressures.  He did have clean coronaries in August 2014, but it may be reasonable to repeat an ischemic evaluation at this time as well.  Cardiology will follow along with you.  Signed, Esmond Plants, MD 04/05/2018, 2:34 AM

## 2018-04-05 NOTE — Progress Notes (Signed)
ANTICOAGULATION CONSULT NOTE - Initial Consult  Pharmacy Consult for heparin Indication: atrial fibrillation  Allergies  Allergen Reactions  . Nsaids Other (See Comments)    Cannot take to due kidney issues  . Beta Adrenergic Blockers Other (See Comments)    An unnamed, white-colored Beta Blocker made him "feel funny" = made him feel "cagey"    Patient Measurements: Height: 5\' 9"  (175.3 cm) Weight: 274 lb 11.2 oz (124.6 kg) IBW/kg (Calculated) : 70.7  HEPARIN DW (KG): 99.2  Vital Signs: Temp: 98.1 F (36.7 C) (09/13 1141) Temp Source: Oral (09/13 1141) BP: 108/74 (09/13 0700) Pulse Rate: 84 (09/13 0700)  Labs: Recent Labs    04/03/18 1458 04-05-2018 1833  HGB  --  11.4*  HCT  --  36.5*  PLT  --  191  CREATININE 6.46* 6.94*    Estimated Creatinine Clearance: 15.3 mL/min (A) (by C-G formula based on SCr of 6.94 mg/dL (H)).   Medical History: Past Medical History:  Diagnosis Date  . AICD (automatic cardioverter/defibrillator) present   . Anemia of chronic disease   . Arthritis   . Atrial flutter (HCC)    DCCV 9/15  . Chest pain on exertion   . Chronic systolic congestive heart failure, NYHA class 2 (HCC)    EF 20-25% 1/16  . CKD (chronic kidney disease) stage 3, GFR 30-59 ml/min (HCC)   . Dyslipidemia   . Gout    Of big toe  . Hyperglycemia   . Hyperlipidemia   . Hypertension   . Morbid obesity with BMI of 45.0-49.9, adult (HCC)   . Non-ischemic cardiomyopathy (HCC)   . Organic erectile dysfunction   . OSA (obstructive sleep apnea)   . Pilonidal cyst   . Submandibular sialolithiasis    Right    Assessment: Eric Murillo is a 57 year old male with hx of non-ischemic cardiomyopathy, aflutter, ESRD on HD. He presented to [facility] ED after a syncopal episode/ICD shock for monomorphic VT. Transferred to [facility] ED after an episode of VT that required [facility]. PTA apixaban regimen is 2.5mg  BID for recurrent atrial flutter. Plan is for [facility] Monday 9/16, cannot do today  since apixaban last dosed 9/13 @ 0900.  Pharmacy consulted for management of heparin infusion. Will not bolus, and will not start infusion until 12 hours after last apixaban dose. CBC stable, no s/sx of bleeding.  Goal of Therapy:  Heparin level 0.3-0.7 units/ml aPTT 66-102 seconds Monitor platelets by anticoagulation protocol: Yes   Plan:   Start heparin infusion at 1250 units/hr @ 2100 Check heparin level/aPTT 8 hours after infusion start time (with 9/14 AM labs) Monitor daily heparin level, aPTT, s/sx of bleeding  Thank you for involving pharmacy in this patient's care.  Wendelyn Breslow, PharmD PGY1 Pharmacy Resident Phone: [phone] 04/05/2018 11:48 AM

## 2018-04-05 NOTE — Progress Notes (Signed)
VT noted on monitor.  Pt states he feels dizzy and SOB, like he might "pass out."  Pt assisted to lying position in bed.  VSS.  Back in SR while obtaining vitals.  X.Blount, NP notified of the above.  Will continue to monitor.  Alonza Bogus

## 2018-04-05 NOTE — Consult Note (Addendum)
[facility] Renal Consultation Note    Indication for Consultation:  Management of ESRD/hemodialysis; anemia, hypertension/volume and secondary hyperparathyroidism  YJW:LKHVFM, Angelina Sheriff, MD  HPI: Eric Murillo is a 57 y.o. male  on ESRD TTS at [facility].  Past medical history significant for nonischemic CM w/EF 20-25% s/p Saint Jude ICD, VT on amiodarone, A flutter, HTN, Hx cardiac arrest, HLD, obesity, gout, and OA.  Patient is compliant with prescribed dialysis regimen.  Of note his dialysis treatment yesterday was <2.5hrs due to cannulation issues but he left at his dry weight.   Patient seen and examined at bedside with family present.  Reports syncopal episode yesterday, with feeling of mild dizziness and diaphoresis prior, that occurred when he was returning to his couch after letting his girlfriend into his house.  States it has occurred before, once about 2 days prior and again about 1 month ago.  Also reports increased episodes of tachycardia lately which he he was seen by his cardiologist for 3 days ago and was started on a new medication that he had only been able to take 1 dose of.  Review of patient St. Jude monitor showed episode of VT w/appropriate shock corresponding with the timing of his syncopal episode.   Patient originally presented to [facility] and was transferred to [facility] following another syncopal episode and VT requiring ATP.  He has had 3 additional episodes overnight after being transferred to here.  CXR shows no active cardiopulmonary disease, and most recent labs are as follows: troponin 0.02, SCr 6.94, BUN 44, Ca 8.8, CO2 29, K 3.5, and Hgb 11.4.    Patient has been admitted for additional management.   Past Medical History:  Diagnosis Date  . AICD (automatic cardioverter/defibrillator) present   . Anemia of chronic disease   . Arthritis   . Atrial flutter (HCC)    DCCV 9/15  . Chest pain on exertion   . Chronic systolic congestive heart  failure, NYHA class 2 (HCC)    EF 20-25% 1/16  . CKD (chronic kidney disease) stage 3, GFR 30-59 ml/min (HCC)   . Dyslipidemia   . Gout    Of big toe  . Hyperglycemia   . Hyperlipidemia   . Hypertension   . Morbid obesity with BMI of 45.0-49.9, adult (HCC)   . Non-ischemic cardiomyopathy (HCC)   . Organic erectile dysfunction   . OSA (obstructive sleep apnea)   . Pilonidal cyst   . Submandibular sialolithiasis    Right   Past Surgical History:  Procedure Laterality Date  . AV FISTULA PLACEMENT Right 11/12/2017   Procedure: CREATION of RIGHT BRACHIAL CEPHALIC ARTERIOVENOUS (AV) FISTULA;  Surgeon: Sherren Kerns, MD;  Location: [facility] OR;  Service: Vascular;  Laterality: Right;  . CARDIAC CATHETERIZATION  2012  . CARDIOVERSION N/A 04/03/2014   Procedure: CARDIOVERSION;  Surgeon: Laurey Morale, MD;  Location: [facility] ENDOSCOPY;  Service: Cardiovascular;  Laterality: N/A;  . CARDIOVERSION N/A 03/09/2016   Procedure: CARDIOVERSION;  Surgeon: Laurey Morale, MD;  Location: [facility] ENDOSCOPY;  Service: Cardiovascular;  Laterality: N/A;  . CARDIOVERSION N/A 06/06/2017   Procedure: CARDIOVERSION;  Surgeon: Laurey Morale, MD;  Location: [facility] ENDOSCOPY;  Service: Cardiovascular;  Laterality: N/A;  . CARDIOVERSION N/A 11/21/2017   Procedure: CARDIOVERSION;  Surgeon: Laurey Morale, MD;  Location: [facility] ENDOSCOPY;  Service: Cardiovascular;  Laterality: N/A;  . COLONOSCOPY    . EP IMPLANTABLE DEVICE N/A 05/21/2015   Procedure: ICD Implant;  Surgeon: Duke Salvia,  MD;  Location: [facility] INVASIVE CV LAB;  Service: Cardiovascular;  Laterality: N/A;  . INSERTION OF DIALYSIS CATHETER Right 11/12/2017   Procedure: PLACEMENT of right internal jugualr tunneled DIALYSIS CATHETER with removal or temporary dialysis catheter;  Surgeon: Sherren Kerns, MD;  Location: [facility] OR;  Service: Vascular;  Laterality: Right;  . IR FLUORO GUIDE CV LINE RIGHT  11/08/2017  . IR US GUIDE VASC ACCESS RIGHT  11/08/2017  . LEFT AND RIGHT  HEART CATHETERIZATION WITH CORONARY ANGIOGRAM N/A 04/24/2014   Procedure: LEFT AND RIGHT HEART CATHETERIZATION WITH CORONARY ANGIOGRAM;  Surgeon: Laurey Morale, MD;  Location: [facility] CATH LAB;  Service: Cardiovascular;  Laterality: N/A;  . SALIVARY GLAND SURGERY  09/12/2012  . SUBMANDIBULAR GLAND EXCISION Right 09/12/2012   Procedure: Removal Right Submandibular Larina Bras;  Surgeon: Serena Colonel, MD;  Location: [facility] OR;  Service: ENT;  Laterality: Right;  . TEE WITHOUT CARDIOVERSION N/A 04/03/2014   Procedure: TRANSESOPHAGEAL ECHOCARDIOGRAM (TEE);  Surgeon: Laurey Morale, MD;  Location: [facility] ENDOSCOPY;  Service: Cardiovascular;  Laterality: N/A;  . TEE WITHOUT CARDIOVERSION N/A 03/09/2016   Procedure: TRANSESOPHAGEAL ECHOCARDIOGRAM (TEE);  Surgeon: Laurey Morale, MD;  Location: [facility] ENDOSCOPY;  Service: Cardiovascular;  Laterality: N/A;  . TEE WITHOUT CARDIOVERSION N/A 06/06/2017   Procedure: TRANSESOPHAGEAL ECHOCARDIOGRAM (TEE);  Surgeon: Laurey Morale, MD;  Location: [facility] ENDOSCOPY;  Service: Cardiovascular;  Laterality: N/A;  . TEE WITHOUT CARDIOVERSION N/A 11/21/2017   Procedure: TRANSESOPHAGEAL ECHOCARDIOGRAM (TEE);  Surgeon: Laurey Morale, MD;  Location: [facility] ENDOSCOPY;  Service: Cardiovascular;  Laterality: N/A;   Family History  Problem Relation Age of Onset  . Hypertension Mother   . Hypertension Father   . Cancer Father        brother died of brain cancer; sister died of bone cancer  . Diabetes Sister   . Diabetes Brother   . Colon cancer Maternal Aunt   . Cardiomyopathy Neg Hx    Social History:  reports that he has never smoked. He has never used smokeless tobacco. He reports that he does not drink alcohol or use drugs. Allergies  Allergen Reactions  . Nsaids Other (See Comments)    Cannot take to due kidney issues  . Beta Adrenergic Blockers Other (See Comments)    An unnamed, white-colored Beta Blocker made him "feel funny" = made him feel "cagey"   Prior to Admission medications    Medication Sig Start Date End Date Taking? Authorizing Provider  allopurinol (ZYLOPRIM) 100 MG tablet TAKE 1 TABLET BY MOUTH TWO TIMES DAILY Patient taking differently: Take 100 mg by mouth 2 (two) times daily.  02/07/18  Yes Inez Catalina, MD  amiodarone (PACERONE) 200 MG tablet Take 1 tablet (200 mg total) by mouth 2 (two) times daily. 04/03/18  Yes Duke Salvia, MD  amoxicillin (AMOXIL) 500 MG capsule Take 500 mg by mouth 3 (three) times daily. 7 DAY COURSE STARTING ON 03/29/2018   Yes [provider]  apixaban (ELIQUIS) 2.5 MG TABS tablet Take 1 tablet (2.5 mg total) by mouth 2 (two) times daily. 03/13/18  Yes Levert Feinstein, MD  bismuth subsalicylate (PEPTO BISMOL) 262 MG chewable tablet Chew 524 mg by mouth daily as needed for indigestion.   Yes [provider]  calcitRIOL (ROCALTROL) 0.5 MCG capsule Take 1 capsule (0.5 mcg total) by mouth every other day. Patient taking differently: Take 0.5 mcg by mouth Every Tuesday,Thursday,and Saturday with dialysis.  11/24/17  Yes Alford Highland, NP  calcium acetate [facility])  667 MG capsule TAKE 2 CAPSULES (1,334 MG TOTAL) BY MOUTH 3 (THREE) TIMES DAILY WITH MEALS. 03/14/18  Yes Levert Feinstein, MD  carvedilol (COREG) 3.125 MG tablet Take 1 tablet (3.125 mg total) by mouth 2 (two) times daily with a meal. Give ONLY on non-HD days. Hold on dialysis days. 11/22/17  Yes Alford Highland, NP  diclofenac sodium (VOLTAREN) 1 % GEL Apply 4 g topically 4 (four) times daily. Patient taking differently: Apply 4 g topically 4 (four) times daily as needed (for pain at affected sites).  02/07/17  Yes Darreld Mclean, MD  HYDROcodone-acetaminophen (NORCO/VICODIN) 5-325 MG tablet Take 1-2 tablets by mouth every 4 (four) hours as needed. 03/02/18  Yes Ivery Quale, PA-C  mexiletine (MEXITIL) 200 MG capsule Take 1 capsule (200 mg total) by mouth 2 (two) times daily. 04/03/18  Yes Duke Salvia, MD  multivitamin (RENA-VIT) TABS tablet Take 1 tablet  by mouth at bedtime. 11/22/17  Yes Alford Highland, NP  clindamycin (CLEOCIN) 300 MG capsule Take 1 capsule (300 mg total) by mouth 3 (three) times daily. Patient not taking: Reported on April 22, 2018 03/02/18   Ivery Quale, PA-C   Current Facility-Administered Medications  Medication Dose Route Frequency Provider Last Rate Last Dose  . acetaminophen (TYLENOL) tablet 650 mg  650 mg Oral Q6H PRN Johnson-Pitts, Endia, MD      . allopurinol (ZYLOPRIM) tablet 100 mg  100 mg Oral BID Johnson-Pitts, Endia, MD   100 mg at 04/05/18 0901  . amiodarone (PACERONE) tablet 200 mg  200 mg Oral BID Laurey Morale, MD   200 mg at 04/05/18 1045  . bismuth subsalicylate (PEPTO BISMOL) chewable tablet 524 mg  524 mg Oral Daily PRN Johnson-Pitts, Endia, MD      . Melene Muller ON 04/06/2018] calcitRIOL (ROCALTROL) capsule 0.5 mcg  0.5 mcg Oral Q T,Th,Sa-HD Johnson-Pitts, Endia, MD      . calcium acetate (PHOSLO) capsule 1,334 mg  1,334 mg Oral TID WC Johnson-Pitts, Endia, MD   1,334 mg at 04/05/18 1323  . heparin ADULT infusion 100 units/mL (25000 units/252mL sodium chloride 0.45%)  1,250 Units/hr Intravenous Continuous Charmian Muff, RPH      . HYDROcodone-acetaminophen (NORCO/VICODIN) 5-325 MG per tablet 1-2 tablet  1-2 tablet Oral Q4H PRN Johnson-Pitts, Endia, MD      . lidocaine (cardiac) 2000 mg in dextrose 5% 500 mL (4mg /mL) IV infusion  1 mg/min Intravenous Continuous Chakravartti, Jaidip, MD 15 mL/hr at 04/05/18 0500 1 mg/min at 04/05/18 0500  . multivitamin (RENA-VIT) tablet 1 tablet  1 tablet Oral QHS Johnson-Pitts, Endia, MD   1 tablet at 04/05/18 0342  . promethazine (PHENERGAN) tablet 12.5 mg  12.5 mg Oral Q6H PRN Johnson-Pitts, Endia, MD      . senna-docusate (Senokot-S) tablet 1 tablet  1 tablet Oral QHS PRN Johnson-Pitts, Endia, MD      . sodium chloride flush (NS) 0.9 % injection 3 mL  3 mL Intravenous Q12H Johnson-Pitts, Endia, MD   3 mL at 04/05/18 0901   Labs: Basic Metabolic Panel: Recent Labs  Lab  04/03/18 1458 04-22-2018 1833  NA 140 136  K 4.2 3.5  CL 92* 96*  CO2 25 29  GLUCOSE 89 108*  BUN 38* 44*  CREATININE 6.46* 6.94*  CALCIUM 9.5 8.8*   CBC: Recent Labs  Lab 2018/04/22 1833  WBC 10.0  HGB 11.4*  HCT 36.5*  MCV 95.3  PLT 191   CBG: Recent Labs  Lab 04/05/18 0624  GLUCAP 111*  Studies/Results: Dg Chest Port 1 View  Result Date: Apr 13, 2018 CLINICAL DATA:  Patient states he has had syncopal episodes today and jerked and felt like his ICD went off. Hx of AICD, atrial flutter, HTN, and cardiac cath. EXAM: PORTABLE CHEST 1 VIEW COMPARISON:  Chest x-ray dated 03/26/2018. FINDINGS: Stable cardiomegaly. Lungs are clear. No pleural effusion or pneumothorax. LEFT chest wall pacemaker apparatus appears stable. Osseous structures about the chest are unremarkable. IMPRESSION: No active disease.  Stable cardiomegaly. Electronically Signed   By: Bary Richard M.D.   On: 2018/04/13 20:32    ROS: All others negative except those listed in HPI.   Physical Exam: Vitals:   04/05/18 0600 04/05/18 0700 04/05/18 0722 04/05/18 1141  BP: 108/88 108/74    Pulse: 90 84    Resp: 16 19    Temp:   98.1 F (36.7 C) 98.1 F (36.7 C)  TempSrc:   Oral Oral  SpO2: 97% 97%    Weight:      Height:         General: WDWN, NAD, well appearing male Head: NCAT sclera not icteric MMM Fundi benign Neck: Supple. PC lymphadenopathy Lungs: CTA bilaterally. No wheeze, rales or rhonchi. Breathing is unlabored.decreased bs, decrease resonance to percussion Heart: RRR.  +2/6 systolic murmur No rubs or gallops. ICD Abdomen: soft, obese, nontender, +BS, no guarding, no rebound tenderness  Lower extremities 2+ edema b/l, no ischemic changes, or open wounds  Neuro: AAOx3. Moves all extremities spontaneously. Psych:  Responds to questions appropriately with a normal affect. Dialysis Access: LU AVF +b/t  Dialysis Orders:  TTS - [facility]  4.5hrs, BFR 400, DFR 800,  EDW 122.5kg, 2K/  2Ca  Access: RU AVF  Heparin None Calcitriol 0.5 mcg PO qHD Venofer 100mg  qHD 7 of 10 completed  Assessment/Plan: 1.  Syncope d/t recurrent VT requiring shock & ATP: Dr. Shirlee Latch and Dr. Graciela Husbands consulting. Plan for [facility] on Monday d/t to need for coumadin bridge.  Meds per EP/cardio. 2.  ESRD -  TTS schedule.  Treatment shortened yesterday but overall labs today are close to baseline.  K 3.5.  Does no appear grossly volume overloaded on exam. No urgent indication for dialysis at this time.  Will plan to continue HD per regular schedule, orders written for tomorrow. Needs gradual lowering of dry. 3.  Hypertension/volume  - BP soft.volume overloaded on exam, meeting EDW as OP, plan for HD tomorrow with net UF goal to standing EDW.lower dry, lower Coreg 4.  Anemia of CKD - Hgb 11.4. No indication for esa at this time. Follow trends. 5.  Secondary Hyperparathyroidism -  Ca/Phos in goal. Continue VDRA, binders.  6.  Nutrition - Alb 3.7. Renal diet w/fluid restrictions. renavite 7. A flutter - per cardio 8. OSA 9. Obesity 10. NICM  Virgina Norfolk, PA-C [facility] Pager: [phone] 04/05/2018, 2:16 PM I have seen and examined this patient and agree with the plan of care seen, eval, examined, changes made. Lower vol, lower meds .  Fayrene Fearing Cardarius Senat 04/05/2018, 3:32 PM

## 2018-04-05 NOTE — Progress Notes (Signed)
Pt had another episode of VT with same symptoms as before.  VS remain stable.  Pt with no loss of consciousness, although he did report feeling dizzy.  Dr. Allena Katz notified, and lidocaine drip ordered.  ICU bed requested.  Rapid response RN and nursing supervisor notified.  Will continue to monitor.  Alonza Bogus

## 2018-04-05 NOTE — Progress Notes (Addendum)
Advanced Heart Failure Rounding Note  PCP-Cardiologist: [name], MD   Subjective:    Presented to [facility] ED after a syncopal episode. 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 Transferred to [facility] and had another episode of VT in ED that required ATP.   He was started on a lidocaine drip with recurrent VT and transferred to [facility]. Amio and mexilitine held.  K 3.5. Mag pending. 2 VT episodes overnight requiring ATP. No further shocks. He feels dizzy, then SOB when he goes into VT.  Weights have been stable at home. Missed HD yesterday, but none otherwise. No missed meds. Denies SOB other than when he goes into VT.  Objective:   Weight Range: 124.6 kg Body mass index is 40.57 kg/m.   Vital Signs:   Temp:  [98.1 F (36.7 C)-98.5 F (36.9 C)] 98.1 F (36.7 C) (09/13 0722) Pulse Rate:  [83-92] 84 (09/13 0700) Resp:  [15-21] 19 (09/13 0700) BP: (84-126)/(65-88) 108/74 (09/13 0700) SpO2:  [74 %-100 %] 97 % (09/13 0700) Weight:  [124.6 kg-170.1 kg] 124.6 kg (09/13 0103)    Weight change: Filed Weights   04/12/2018 1734 04/05/18 0103  Weight: (!) 170.1 kg 124.6 kg    Intake/Output:   Intake/Output Summary (Last 24 hours) at 04/05/2018 0812 Last data filed at 04/05/2018 0500 Gross per 24 hour  Intake 165.62 ml  Output -  Net 165.62 ml      Physical Exam    General:  Sitting on side of bed. No resp difficulty HEENT: Normal Neck: Supple. JVP ~10. Carotids 2+ bilat; no bruits. No lymphadenopathy or thyromegaly appreciated. Cor: PMI nondisplaced. Regular rate & rhythm. No rubs, gallops or murmurs. Lungs: Clear Abdomen: Soft, nontender, nondistended. No hepatosplenomegaly. No bruits or masses. Good bowel sounds. Extremities: No cyanosis, clubbing, rash, BLE 1+ edema. RUE AV fistula Neuro: Alert & orientedx3, cranial nerves grossly intact. moves all 4 extremities w/o difficulty. Affect pleasant   Telemetry   Appears to be NSR with very small  pwaves. 70-80s. 2 episodes of VT requiring ATP overnight. Personally reviewed.   EKG    Appears to be NSR 87 bpm.   Labs    CBC Recent Labs    03/27/2018 1833  WBC 10.0  HGB 11.4*  HCT 36.5*  MCV 95.3  PLT 191   Basic Metabolic Panel Recent Labs    16/10/96 1458 04/01/2018 1833  NA 140 136  K 4.2 3.5  CL 92* 96*  CO2 25 29  GLUCOSE 89 108*  BUN 38* 44*  CREATININE 6.46* 6.94*  CALCIUM 9.5 8.8*   Liver Function Tests No results for input(s): AST, ALT, ALKPHOS, BILITOT, PROT, ALBUMIN in the last 72 hours. No results for input(s): LIPASE, AMYLASE in the last 72 hours. Cardiac Enzymes No results for input(s): CKTOTAL, CKMB, CKMBINDEX, TROPONINI in the last 72 hours.  BNP: BNP (last 3 results) Recent Labs    07/26/17 0916 11/03/17 1238 11/13/17 0400  BNP 399.7* 1,160.1* 1,085.3*    ProBNP (last 3 results) No results for input(s): PROBNP in the last 8760 hours.   D-Dimer No results for input(s): DDIMER in the last 72 hours. Hemoglobin A1C No results for input(s): HGBA1C in the last 72 hours. Fasting Lipid Panel No results for input(s): CHOL, HDL, LDLCALC, TRIG, CHOLHDL, LDLDIRECT in the last 72 hours. Thyroid Function Tests No results for input(s): TSH, T4TOTAL, T3FREE, THYROIDAB in the last 72 hours.  Invalid input(s): FREET3  Other results:   Imaging  Dg Chest Port 1 View  Result Date: 04-30-2018 CLINICAL DATA:  Patient states he has had syncopal episodes today and jerked and felt like his ICD went off. Hx of AICD, atrial flutter, HTN, and cardiac cath. EXAM: PORTABLE CHEST 1 VIEW COMPARISON:  Chest x-ray dated 03/26/2018. FINDINGS: Stable cardiomegaly. Lungs are clear. No pleural effusion or pneumothorax. LEFT chest wall pacemaker apparatus appears stable. Osseous structures about the chest are unremarkable. IMPRESSION: No active disease.  Stable cardiomegaly. Electronically Signed   By: Bary Richard M.D.   On: April 30, 2018 20:32      Medications:      Scheduled Medications: . allopurinol  100 mg Oral BID  . apixaban  2.5 mg Oral BID  . [START ON 04/06/2018] calcitRIOL  0.5 mcg Oral Q T,Th,Sa-HD  . calcium acetate  1,334 mg Oral TID WC  . carvedilol  3.125 mg Oral 2 times per day on Sun Mon Wed Fri  . multivitamin  1 tablet Oral QHS  . sodium chloride flush  3 mL Intravenous Q12H     Infusions: . lidocaine 1 mg/min (04/05/18 0500)     PRN Medications:  acetaminophen **OR** acetaminophen, bismuth subsalicylate, HYDROcodone-acetaminophen, promethazine, senna-docusate    Patient Profile   Eric Murillo is a 57 y.o. male with history of morbid obesity, hyperlipidemia, HTN, atrial flutter (s/p TEE DC-CV 04/03/14 and again in 8/17), NICM with chronic systolic HF and severe OSA but unable to tolerate CPAP.    Assessment/Plan   1. Recurrent VT requiring shock and ATP. - Follows with Dr Graciela Husbands. EP has been consulted. Last saw Dr Graciela Husbands 9/11 and was having recurrent episodes of VF. Mexilitine was added in addition to his amio. - Now on lidocaine drip. Check lidocaine level tonight.  - On amio 200 mg BID and mexiletine 200 mg BID at home. Defer medications to EP.  - Plan to do [facility] Monday with Dr Shirlee Latch. Cannot do today since he has had Eliquis. Will plan to bridge over the weekend. Will discuss timing with MD.  2. Chronic systolic heart failure: Nonischemic cardiomyopathy, likely related to HTN.  Echo in 8/14 with EF 45% but EF down to 25-30% on TEE while in atrial flutter (03/2014).  Echo (1/16) with EF ~25% and echo in 6/16 with EF 30-35%.  No cardiac MRI done with elevated creatinine and size. There was concern for cardiac amyloidosis.  However, negative SPEP and abdominal fat pad biopsy negative. Low voltage on ECG may be due to obesity and not amyloidosis. Echo 10/2017: EF 20-25%, mild to mod MR. S/P St Jude ICD - Volume managed by HD. Looks okay today, maybe slightly elevated. Missed HD yesterday. - Continue carvedilol 3.125 mg  BID on non-HD days.  - No spiro/dig/arb with CKD.  - No bidil with soft BPs - Not a candidate for VAD since he is ESRD. BMI 40. Not a transplant candidate.   3. Atypical Atrial Flutter: Recurrent - s/p several DCCVs, most recently 11/21/17. Has been seen by EP. Not felt to be amenable to ablation.  - I think he is in NSR today. Looks similar to his previous NSR EKGs.  - Continue Eliquis 2.5 mg BID (recent decrease in dose by Dr Cyndie Chime). Will need to hold 48 hours prior to cath. Will discuss with Dr Shirlee Latch if he wants to go ahead and put him on a heparin drip.   4. ESRD - Now on HD T/R/Sat - Missed HD yesterday. Probably needs to catch up today. Per primary team.  -  Following with Dr Deterding  5. HTN:   - SBP 90-100s.  6. OSA: - Has been unable to tolerate CPAP  7. Morbid Obesity:  - Body mass index is 40.57 kg/m.   Medication concerns reviewed with patient and pharmacy team. Barriers identified: Needs help getting amiodarone refill  Length of Stay: 1  Alford Highland, NP  04/05/2018, 8:12 AM  Advanced Heart Failure Team Pager [phone] (M-F; 7a - 4p)  Please contact [facility] for night-coverage after hours (4p -7a ) and weekends on [email or web address]  Patient seen with NP, agree with the above note.   Mr Ostertag has returned with VT requiring ATP and defibrillation.  Overnight in unit had more VT with ATP.  Currently on lidocaine gtt, in NSR with 1st degree AVB versus ectopic atrial rhythm.   On exam, JVP 12 cm.  Regular S1S2.  1+ edema to knees bilaterally .  1. VT: Patient has had multiple recent VT episodes requiring ATP and defibrillation.  He saw Dr. Graciela Husbands earlier this week with plan to start mexiletine, but he had only had 1 dose when VT recurred and he ended up in hospital.  Currently on lidocaine gtt.  I am concerned that this is electromechanical, but cannot fully rule out new coronary disease.  TnI negative.  - EP consulted.  - Restart home amiodarone 200 mg  bid.   - Will need transition from lidocaine to mexiletine eventually, will let EP determine timing.  - He will need left/right heart cath.  Will not do today as he got apixaban today.  Hold apixaban, plan for cath Monday (RHC/LHC, scheduled already).  2. H/o atrial fibrillation and atypical flutter: Rhythm this morning is ectopic atrial rhythm versus NSR with long 1st degree AVB.   - Will repeat ECG and ask EP to take a look.  - Stop apixaban for cath, will use heparin gtt.  3. Acute on chronic systolic CHF: TEEE 5/19 with EF 25-30%, diffuse hypokinesis.  Long history of nonischemic cardiomyopathy.  He is volume overloaded on exam today, due for HD.  - RHC/LHC as above.  - Will consult renal for HD today.  - Continue low dose Coreg on non-dialysis days.  - I am concerned that he may be nearing end-stage HF with recurrent VT.  He is not an LVAD candidate.  RHC this admission to assess cardiac output.  Would consider referral to [facility] for heart/kidney transplant evaluation, but would likely need to lose considerable weight.  4. ESRD: Consult renal.   [name] 04/05/2018 10:33 AM

## 2018-04-05 NOTE — Progress Notes (Signed)
Eric Murillo TEAM 1 - Stepdown/ICU TEAM  Eric Murillo  ZOX:[id number] DOB: 1960/09/16 DOA: 04/22/2018 PCP: Synetta Shadow, MD    Brief Narrative:  57 y.o. male with a hx of nonischemic cardiomyopathy with EF of 20-25%, ESRD on dialysis, and defibrillator placement who presented with syncope. He got up to let his girlfriend in the door, and was walking back to the couch when he had an abrupt syncopal episode. He saw his cardiologist Dr. Graciela Husbands recently who started him mexiline.  His ICD was interrogated in the ED and revealed V. tach coincident with the timing of his syncope, and an appropriate defib shock.    After transfer to [facility] the pt suffered another episode of monomorphic VT, terminated by ATP.    Significant Events: 9/12 admit for VT - syncope   Subjective: Chart reviewed.  Medical hx noted.  CHF and Nephrology teams are addressing all active issues today.    Assessment & Plan:  VT w/ syncope  Chronic nonischemic systolic CHF   ESRD on HD T/Th/Sat  Morbid obesity - Body mass index is 40.57 kg/m.  HTN  HLD  DVT prophylaxis: eliquis Code Status: FULL CODE Family Communication: no family present at time of exam  Disposition Plan:   Consultants:  [facility] Cardiology   Antimicrobials:  none  Objective: Blood pressure 108/74, pulse 84, temperature 98.1 F (36.7 C), temperature source Oral, resp. rate 19, height 5\' 9"  (1.753 m), weight 124.6 kg, SpO2 97 %.  Intake/Output Summary (Last 24 hours) at 04/05/2018 0832 Last data filed at 04/05/2018 0500 Gross per 24 hour  Intake 165.62 ml  Output -  Net 165.62 ml   Filed Weights   03/24/2018 1734 04/05/18 0103  Weight: (!) 170.1 kg 124.6 kg    Examination: No exam by [facility] today   CBC: Recent Labs  Lab 04/03/2018 1833  WBC 10.0  HGB 11.4*  HCT 36.5*  MCV 95.3  PLT 191   Basic Metabolic Panel: Recent Labs  Lab 04/03/18 1458 04/15/2018 1833  NA 140 136  K 4.2 3.5  CL 92* 96*  CO2 25 29  GLUCOSE 89 108*  BUN  38* 44*  CREATININE 6.46* 6.94*  CALCIUM 9.5 8.8*   GFR: Estimated Creatinine Clearance: 15.3 mL/min (A) (by C-G formula based on SCr of 6.94 mg/dL (H)).  Liver Function Tests: No results for input(s): AST, ALT, ALKPHOS, BILITOT, PROT, ALBUMIN in the last 168 hours. No results for input(s): LIPASE, AMYLASE in the last 168 hours. No results for input(s): AMMONIA in the last 168 hours.  Coagulation Profile: No results for input(s): INR, PROTIME in the last 168 hours.  Cardiac Enzymes: No results for input(s): CKTOTAL, CKMB, CKMBINDEX, TROPONINI in the last 168 hours.  HbA1C: Hemoglobin A1C  Date/Time Value Ref Range Status  07/10/2013 04:11 PM 5.4  Final  01/20/2013 04:28 PM 5.6  Final    CBG: Recent Labs  Lab 04/05/18 0624  GLUCAP 111*    Recent Results (from the past 240 hour(s))  MRSA PCR Screening     Status: None   Collection Time: 04/05/18  1:01 AM  Result Value Ref Range Status   MRSA by PCR NEGATIVE NEGATIVE Final    Comment:        The GeneXpert MRSA Assay (FDA approved for NASAL specimens only), is one component of a comprehensive MRSA colonization surveillance program. It is not intended to diagnose MRSA infection nor to guide or monitor treatment for MRSA infections. Performed at [facility], [address], [place], [place] [address]      Scheduled Meds: . allopurinol  100 mg Oral BID  . apixaban  2.5 mg Oral BID  . [START ON 04/06/2018] calcitRIOL  0.5 mcg Oral Q T,Th,Sa-HD  . calcium acetate  1,334 mg Oral TID WC  . carvedilol  3.125 mg Oral 2 times per day on Sun Mon Wed Fri  . multivitamin  1 tablet Oral QHS  . sodium chloride flush  3 mL Intravenous Q12H   Continuous Infusions: . lidocaine 1 mg/min (04/05/18 0500)     LOS: 1 day   [name], MD [facility] Office  [phone] Pager - Text Page per Loretha Stapler  If 7PM-7AM, please contact night-coverage per Amion 04/05/2018, 8:32 AM

## 2018-04-05 NOTE — Significant Event (Signed)
Rapid Response Event Note  Pt with symptomatic(dizzyness, syncope) VT requiring lidocaine infusion.  Pt with AICD and frequent runs of VT being controlled by ATP.  Lidocaine infusion started at 0332.  Will transfer to ICU.  Rose Fillers

## 2018-04-05 NOTE — Consult Note (Addendum)
Cardiology Consultation:   Patient ID: Eric Murillo MRN: [id number]; DOB: 12-29-1960  Admit date: 04/11/2018 Date of Consult: 04/05/2018  Primary Care Provider: Synetta Shadow, MD Primary Cardiologist: [name], MD  Primary Electrophysiologist:  Dr. Graciela Husbands  Patient Profile:   Eric Murillo is a 57 y.o. male with a hx of HTN, HLD, PAFlutter (he was seen by Dr. Elberta Fortis to evaluate for ablation, felt not to be a candidate with 2 different arrhythmias, a severely dilated LA, mod dilated RA, and recommended ongoing amiodarone tx, NICM, chronic CHF (systolic), severe OSA (untreated, unable to tolerate CPAP), morbid obesity, ESRF on HD, who is being seen today for the evaluation of VT at the request of Dr. Shirlee Latch.  History of Present Illness:   [name]  saw A. Glory Buff late Aug after an episode of VF on 03/08/18 that was terminated with HV shock. He reported compliance with amiodarone and HD. He had dizziness proceeding the episode. His amiodarone was increased to 400mg  BID for 2 weeks then resume 200mg  BID.  He saw Dr. Graciela Husbands in follow up 04/03/18, he mentioned ongoing life-threatening arrhythmias and mexiletine was added.  He was admitted was transferred from [facility] to [facility] this AM.  He had a syncopal event.  He was unable to be dialyzed yesterday (unable to gain access) with plans to return the following day.  Later while at his girlfriend's house had a syncopal event >> APH via EMS. LABS K+ 3.5 BUN/Creat 44/6.94 WBC 10.0 H/H 11/36 Plts 191   Device information: SJM single chamber ICD, implanted 05/21/15 AAD tx: amiodarone, appears for his Atrial arrhythmias + hx of appropriate therapies Interrogation: battery and lead measurements are OK VT episodes 4 failed APps, one shock successful ATP >> another morphology, accelerated as well, 4th accelerated futher to a 3rd morphology >> shock  Past Medical History:  Diagnosis Date  . AICD (automatic cardioverter/defibrillator) present     . Anemia of chronic disease   . Arthritis   . Atrial flutter (HCC)    DCCV 9/15  . Chest pain on exertion   . Chronic systolic congestive heart failure, NYHA class 2 (HCC)    EF 20-25% 1/16  . CKD (chronic kidney disease) stage 3, GFR 30-59 ml/min (HCC)   . Dyslipidemia   . Gout    Of big toe  . Hyperglycemia   . Hyperlipidemia   . Hypertension   . Morbid obesity with BMI of 45.0-49.9, adult (HCC)   . Non-ischemic cardiomyopathy (HCC)   . Organic erectile dysfunction   . OSA (obstructive sleep apnea)   . Pilonidal cyst   . Submandibular sialolithiasis    Right    Past Surgical History:  Procedure Laterality Date  . AV FISTULA PLACEMENT Right 11/12/2017   Procedure: CREATION of RIGHT BRACHIAL CEPHALIC ARTERIOVENOUS (AV) FISTULA;  Surgeon: Sherren Kerns, MD;  Location: [facility] OR;  Service: Vascular;  Laterality: Right;  . CARDIAC CATHETERIZATION  2012  . CARDIOVERSION N/A 04/03/2014   Procedure: CARDIOVERSION;  Surgeon: Laurey Morale, MD;  Location: [facility] ENDOSCOPY;  Service: Cardiovascular;  Laterality: N/A;  . CARDIOVERSION N/A 03/09/2016   Procedure: CARDIOVERSION;  Surgeon: Laurey Morale, MD;  Location: [facility] ENDOSCOPY;  Service: Cardiovascular;  Laterality: N/A;  . CARDIOVERSION N/A 06/06/2017   Procedure: CARDIOVERSION;  Surgeon: Laurey Morale, MD;  Location: [facility] ENDOSCOPY;  Service: Cardiovascular;  Laterality: N/A;  . CARDIOVERSION N/A 11/21/2017   Procedure: CARDIOVERSION;  Surgeon: Laurey Morale, MD;  Location: [facility] ENDOSCOPY;  Service:  Cardiovascular;  Laterality: N/A;  . COLONOSCOPY    . EP IMPLANTABLE DEVICE N/A 05/21/2015   Procedure: ICD Implant;  Surgeon: Duke Salvia, MD;  Location: [facility] INVASIVE CV LAB;  Service: Cardiovascular;  Laterality: N/A;  . INSERTION OF DIALYSIS CATHETER Right 11/12/2017   Procedure: PLACEMENT of right internal jugualr tunneled DIALYSIS CATHETER with removal or temporary dialysis catheter;  Surgeon: Sherren Kerns, MD;  Location: [facility]  OR;  Service: Vascular;  Laterality: Right;  . IR FLUORO GUIDE CV LINE RIGHT  11/08/2017  . IR US GUIDE VASC ACCESS RIGHT  11/08/2017  . LEFT AND RIGHT HEART CATHETERIZATION WITH CORONARY ANGIOGRAM N/A 04/24/2014   Procedure: LEFT AND RIGHT HEART CATHETERIZATION WITH CORONARY ANGIOGRAM;  Surgeon: Laurey Morale, MD;  Location: [facility] CATH LAB;  Service: Cardiovascular;  Laterality: N/A;  . SALIVARY GLAND SURGERY  09/12/2012  . SUBMANDIBULAR GLAND EXCISION Right 09/12/2012   Procedure: Removal Right Submandibular Larina Bras;  Surgeon: Serena Colonel, MD;  Location: [facility] OR;  Service: ENT;  Laterality: Right;  . TEE WITHOUT CARDIOVERSION N/A 04/03/2014   Procedure: TRANSESOPHAGEAL ECHOCARDIOGRAM (TEE);  Surgeon: Laurey Morale, MD;  Location: [facility] ENDOSCOPY;  Service: Cardiovascular;  Laterality: N/A;  . TEE WITHOUT CARDIOVERSION N/A 03/09/2016   Procedure: TRANSESOPHAGEAL ECHOCARDIOGRAM (TEE);  Surgeon: Laurey Morale, MD;  Location: [facility] ENDOSCOPY;  Service: Cardiovascular;  Laterality: N/A;  . TEE WITHOUT CARDIOVERSION N/A 06/06/2017   Procedure: TRANSESOPHAGEAL ECHOCARDIOGRAM (TEE);  Surgeon: Laurey Morale, MD;  Location: [facility] ENDOSCOPY;  Service: Cardiovascular;  Laterality: N/A;  . TEE WITHOUT CARDIOVERSION N/A 11/21/2017   Procedure: TRANSESOPHAGEAL ECHOCARDIOGRAM (TEE);  Surgeon: Laurey Morale, MD;  Location: [facility] ENDOSCOPY;  Service: Cardiovascular;  Laterality: N/A;     Home Medications:  Prior to Admission medications   Medication Sig Start Date End Date Taking? Authorizing Provider  allopurinol (ZYLOPRIM) 100 MG tablet TAKE 1 TABLET BY MOUTH TWO TIMES DAILY Patient taking differently: Take 100 mg by mouth 2 (two) times daily.  02/07/18  Yes Inez Catalina, MD  amiodarone (PACERONE) 200 MG tablet Take 1 tablet (200 mg total) by mouth 2 (two) times daily. 04/03/18  Yes Duke Salvia, MD  amoxicillin (AMOXIL) 500 MG capsule Take 500 mg by mouth 3 (three) times daily. 7 DAY COURSE STARTING ON 03/29/2018   Yes  [provider]  apixaban (ELIQUIS) 2.5 MG TABS tablet Take 1 tablet (2.5 mg total) by mouth 2 (two) times daily. 03/13/18  Yes Levert Feinstein, MD  bismuth subsalicylate (PEPTO BISMOL) 262 MG chewable tablet Chew 524 mg by mouth daily as needed for indigestion.   Yes [provider]  calcitRIOL (ROCALTROL) 0.5 MCG capsule Take 1 capsule (0.5 mcg total) by mouth every other day. Patient taking differently: Take 0.5 mcg by mouth Every Tuesday,Thursday,and Saturday with dialysis.  11/24/17  Yes Alford Highland, NP  calcium acetate (PHOSLO) 667 MG capsule TAKE 2 CAPSULES (1,334 MG TOTAL) BY MOUTH 3 (THREE) TIMES DAILY WITH MEALS. 03/14/18  Yes Levert Feinstein, MD  carvedilol (COREG) 3.125 MG tablet Take 1 tablet (3.125 mg total) by mouth 2 (two) times daily with a meal. Give ONLY on non-HD days. Hold on dialysis days. 11/22/17  Yes Alford Highland, NP  diclofenac sodium (VOLTAREN) 1 % GEL Apply 4 g topically 4 (four) times daily. Patient taking differently: Apply 4 g topically 4 (four) times daily as needed (for pain at affected sites).  02/07/17  Yes Darreld Mclean, MD  HYDROcodone-acetaminophen (NORCO/VICODIN) [phone]  MG tablet Take 1-2 tablets by mouth every 4 (four) hours as needed. 03/02/18  Yes Ivery Quale, PA-C  mexiletine (MEXITIL) 200 MG capsule Take 1 capsule (200 mg total) by mouth 2 (two) times daily. 04/03/18  Yes Duke Salvia, MD  multivitamin (RENA-VIT) TABS tablet Take 1 tablet by mouth at bedtime. 11/22/17  Yes Alford Highland, NP  clindamycin (CLEOCIN) 300 MG capsule Take 1 capsule (300 mg total) by mouth 3 (three) times daily. Patient not taking: Reported on 04/19/2018 03/02/18   Ivery Quale, PA-C    Inpatient Medications: Scheduled Meds: . allopurinol  100 mg Oral BID  . apixaban  2.5 mg Oral BID  . [START ON 04/06/2018] calcitRIOL  0.5 mcg Oral Q T,Th,Sa-HD  . calcium acetate  1,334 mg Oral TID WC  . carvedilol  3.125 mg Oral 2 times per day on Sun Mon Wed  Fri  . multivitamin  1 tablet Oral QHS  . sodium chloride flush  3 mL Intravenous Q12H   Continuous Infusions: . lidocaine 1 mg/min (04/05/18 0500)   PRN Meds: acetaminophen **OR** [DISCONTINUED] acetaminophen, bismuth subsalicylate, HYDROcodone-acetaminophen, promethazine, senna-docusate  Allergies:    Allergies  Allergen Reactions  . Nsaids Other (See Comments)    Cannot take to due kidney issues  . Beta Adrenergic Blockers Other (See Comments)    An unnamed, white-colored Beta Blocker made him "feel funny" = made him feel "cagey"    Social History:   Social History   Socioeconomic History  . Marital status: Single    Spouse name: Not on file  . Number of children: Not on file  . Years of education: Not on file  . Highest education level: Not on file  Occupational History  . Not on file  Social Needs  . Financial resource strain: Not on file  . Food insecurity:    Worry: Not on file    Inability: Not on file  . Transportation needs:    Medical: Not on file    Non-medical: Not on file  Tobacco Use  . Smoking status: Never Smoker  . Smokeless tobacco: Never Used  Substance and Sexual Activity  . Alcohol use: No    Alcohol/week: 0.0 standard drinks  . Drug use: No  . Sexual activity: Not on file  Lifestyle  . Physical activity:    Days per week: Not on file    Minutes per session: Not on file  . Stress: Not on file  Relationships  . Social connections:    Talks on phone: Not on file    Gets together: Not on file    Attends religious service: Not on file    Active member of club or organization: Not on file    Attends meetings of clubs or organizations: Not on file    Relationship status: Not on file  . Intimate partner violence:    Fear of current or ex partner: Not on file    Emotionally abused: Not on file    Physically abused: Not on file    Forced sexual activity: Not on file  Other Topics Concern  . Not on file  Social History Narrative    Financial assistance approved for 100% discount at [facility] and has [facility] card   [employer]  March 16, 2010 9:42 AM      Lives with mother.  Has a girlfriend    Family History:   Family History  Problem Relation Age of Onset  . Hypertension Mother   .  Hypertension Father   . Cancer Father        brother died of brain cancer; sister died of bone cancer  . Diabetes Sister   . Diabetes Brother   . Colon cancer Maternal Aunt   . Cardiomyopathy Neg Hx      ROS:  Please see the history of present illness. All other ROS reviewed and negative.     Physical Exam/Data:   Vitals:   04/05/18 0500 04/05/18 0600 04/05/18 0700 04/05/18 0722  BP:  108/88 108/74   Pulse: 86 90 84   Resp: (!) 21 16 19    Temp:    98.1 F (36.7 C)  TempSrc:    Oral  SpO2: 95% 97% 97%   Weight:      Height:        Intake/Output Summary (Last 24 hours) at 04/05/2018 1009 Last data filed at 04/05/2018 0500 Gross per 24 hour  Intake 165.62 ml  Output -  Net 165.62 ml   Filed Weights   04-22-18 1734 04/05/18 0103  Weight: (!) 170.1 kg 124.6 kg   Body mass index is 40.57 kg/m.  General:  Well nourished, well developed, in no acute distress HEENT: normal Lymph: no adenopathy Neck: no JVD Endocrine:  No thryomegaly Vascular: No carotid bruits  Cardiac:   RRR; 1/6 SM, no gallops or rubs Lungs:  CTA b/l, no wheezing, rhonchi or rales  Abd: soft, nontender, obese, no hepatomegaly  Ext: trace edema Musculoskeletal:  No deformities Skin: warm and dry  Neuro:  No gross focal abnormalities noted Psych:  Normal affect   EKG:  The EKG was personally reviewed and demonstrates:   Looks SR, marked 1st degree PR , IVCD QRS Telemetry:  Telemetry was personally reviewed and demonstrates:   Looks like SR long 1st dgree AVB, he has had sustained VT, 2 events, once with 4 ATPs, another 3 ATPa  Relevant CV Studies:   11/21/17: TEE Study Conclusions - Left ventricle: The cavity size was mildly  dilated. Wall   thickness was normal. Systolic function was severely reduced. The   estimated ejection fraction was in the range of 25% to 30%.   Diffuse hypokinesis. - Aortic valve: There was no stenosis. - Aorta: The ascending aorta was normal in caliber. - Mitral valve: There was mild to moderate regurgitation. - Left atrium: The atrium was moderately dilated. No evidence of   thrombus in the atrial cavity or appendage. - Right ventricle: The cavity size was normal. Pacer wire or   catheter noted in right ventricle. Systolic function was mildly   reduced. - Right atrium: HD catheter noted in SVC. The atrium was mildly   dilated. - Atrial septum: No ASD or PFO by color doppler. Impressions: - May proceed to DCCV.  09/02/14: Myoview (unable to open result)  Laboratory Data:  Chemistry Recent Labs  Lab 04/03/18 1458 2018/04/22 1833  NA 140 136  K 4.2 3.5  CL 92* 96*  CO2 25 29  GLUCOSE 89 108*  BUN 38* 44*  CREATININE 6.46* 6.94*  CALCIUM 9.5 8.8*  GFRNONAA 9* 8*  GFRAA 10* 9*  ANIONGAP  --  11    No results for input(s): PROT, ALBUMIN, AST, ALT, ALKPHOS, BILITOT in the last 168 hours. Hematology Recent Labs  Lab 2018-04-22 1833  WBC 10.0  RBC 3.83*  HGB 11.4*  HCT 36.5*  MCV 95.3  MCH 29.8  MCHC 31.2  RDW 14.5  PLT 191   Cardiac EnzymesNo results for  input(s): TROPONINI in the last 168 hours.  Recent Labs  Lab 04/21/2018 2135  TROPIPOC 0.02    BNPNo results for input(s): BNP, PROBNP in the last 168 hours.  DDimer No results for input(s): DDIMER in the last 168 hours.  Radiology/Studies:   Dg Chest Port 1 View Result Date: 04/21/2018 CLINICAL DATA:  Patient states he has had syncopal episodes today and jerked and felt like his ICD went off. Hx of AICD, atrial flutter, HTN, and cardiac cath. EXAM: PORTABLE CHEST 1 VIEW COMPARISON:  Chest x-ray dated 03/26/2018. FINDINGS: Stable cardiomegaly. Lungs are clear. No pleural effusion or pneumothorax. LEFT chest  wall pacemaker apparatus appears stable. Osseous structures about the chest are unremarkable. IMPRESSION: No active disease.  Stable cardiomegaly. Electronically Signed   By: Bary Richard M.D.   On: 03/29/2018 20:32    Assessment and Plan:   1. VT storm     Check mag (pending), keep K+ > 4.0, will defer to nephrology electrolytes  He has had VT here w/ATP tx     Agree with resuming amiodarone in-patient (was on out pt)     And lidocaine gtt Planned for LHC, though had Eliquis, will be on Monday Follow lidocaine level  He has had VT below detection I will review with Dr. Graciela Husbands programming options.  Noting his ATP has accelerated/changed his VT as well.  In d/w Dr. Shirlee Latch, given ESRF on HD, not a candidate for advanced therapies unfortunately.   C/w AHF team as well.            For questions or updates, please contact [facility] Please consult [email or web address] for contact info under     Signed, Sheilah Pigeon, PA-C  04/05/2018 10:09 AM   Ventricular tachycardia monomorphic and polymorphic-storm  Nonischemic cardiomyopathy  Morbid obesity  End-stage renal disease  Congestive heart failure class III  Implantable defibrillator   The patient has recurrent ventricular tachycardia.  We started mexiletine in the office the other day to augment therapy with amiodarone.  He was unable to get a insulin before he had recurrent ventricular tachycardia.  Unfortunately, he continued to have ventricular tachycardia last night.  Is largely polymorphic.  I suspect that this is resulting from end-stage cardiomyopathy and electromechanical interactions.  He has few heart failure options.  Hence, I think we have a few arrhythmia options.  Would continue on amiodarone.  He is on lidocaine overnight.  I would transition him to mexiletine 150 every 8 morning.  (He had a little bit of nausea on 200 mg every 12.)  If he has recurrent ventricular tachycardia, not withstanding the paucity of  data and end-stage renal disease, I would add ranolazine.  I have also spoken with him that this may be the beginning of the end of his days.  He has been thinking about the same things.  It may well be valuable over the next couple of days to see if he is open to having hospice help [place] think about how we would approach the end of life care.

## 2018-04-05 NOTE — Progress Notes (Signed)
Report given to [facility] nurse and pt transferred to 2H16 via bed with lidocaine gtt infusing, accompanied by Onalee Hua, rapid response RN and Vicky, NT.  Eric Murillo

## 2018-04-06 ENCOUNTER — Inpatient Hospital Stay (HOSPITAL_COMMUNITY): Payer: Medicaid Other

## 2018-04-06 LAB — ALBUMIN: Albumin: 3.3 g/dL — ABNORMAL LOW (ref 3.5–5.0)

## 2018-04-06 LAB — CBC
HEMATOCRIT: 35.4 % — AB (ref 39.0–52.0)
Hemoglobin: 10.8 g/dL — ABNORMAL LOW (ref 13.0–17.0)
MCH: 29.4 pg (ref 26.0–34.0)
MCHC: 30.5 g/dL (ref 30.0–36.0)
MCV: 96.5 fL (ref 78.0–100.0)
Platelets: 199 10*3/uL (ref 150–400)
RBC: 3.67 MIL/uL — ABNORMAL LOW (ref 4.22–5.81)
RDW: 14.1 % (ref 11.5–15.5)
WBC: 8.7 10*3/uL (ref 4.0–10.5)

## 2018-04-06 LAB — BASIC METABOLIC PANEL
Anion gap: 11 (ref 5–15)
Anion gap: 15 (ref 5–15)
BUN: 56 mg/dL — ABNORMAL HIGH (ref 6–20)
BUN: 23 mg/dL — AB (ref 6–20)
CALCIUM: 8.8 mg/dL — AB (ref 8.9–10.3)
CALCIUM: 8.5 mg/dL — AB (ref 8.9–10.3)
CO2: 26 mmol/L (ref 22–32)
CO2: 25 mmol/L (ref 22–32)
CREATININE: 3.48 mg/dL — AB (ref 0.61–1.24)
Chloride: 99 mmol/L (ref 98–111)
Chloride: 94 mmol/L — ABNORMAL LOW (ref 98–111)
Creatinine, Ser: 6.03 mg/dL — ABNORMAL HIGH (ref 0.61–1.24)
GFR calc non Af Amer: 9 mL/min — ABNORMAL LOW (ref >60)
GFR, EST AFRICAN AMERICAN: 11 mL/min — AB (ref >60)
GFR, EST AFRICAN AMERICAN: 21 mL/min — AB (ref >60)
GFR, EST NON AFRICAN AMERICAN: 18 mL/min — AB (ref >60)
GLUCOSE: 111 mg/dL — AB (ref 70–99)
Glucose, Bld: 128 mg/dL — ABNORMAL HIGH (ref 70–99)
Potassium: 4.1 mmol/L (ref 3.5–5.1)
Potassium: 3.2 mmol/L — ABNORMAL LOW (ref 3.5–5.1)
Sodium: 136 mmol/L (ref 135–145)
Sodium: 134 mmol/L — ABNORMAL LOW (ref 135–145)

## 2018-04-06 LAB — MAGNESIUM
MAGNESIUM: 2.7 mg/dL — AB (ref 1.7–2.4)
MAGNESIUM: 2.1 mg/dL (ref 1.7–2.4)

## 2018-04-06 LAB — HEPARIN LEVEL (UNFRACTIONATED): Heparin Unfractionated: 1.23 IU/mL — ABNORMAL HIGH (ref 0.30–0.70)

## 2018-04-06 LAB — PHOSPHORUS: PHOSPHORUS: 3.9 mg/dL (ref 2.5–4.6)

## 2018-04-06 LAB — LIDOCAINE LEVEL
Lidocaine Lvl: >30 ug/mL (ref 1.5–5.0)
Lidocaine Lvl: 3.4 ug/mL (ref 1.5–5.0)

## 2018-04-06 LAB — APTT
APTT: 74 s — AB (ref 24–36)
APTT: 82 s — AB (ref 24–36)

## 2018-04-06 MED ORDER — PENTAFLUOROPROP-TETRAFLUOROETH EX AERO: 1.0000 "application " | INHALATION_SPRAY | CUTANEOUS | Status: DC | PRN

## 2018-04-06 MED ORDER — MEXILETINE HCL 150 MG PO CAPS
150.0000 mg | ORAL_CAPSULE | Freq: Three times a day (TID) | ORAL | Status: DC
  Administered 2018-04-06 – 2018-04-08 (×6): 150 mg via ORAL
  Filled 2018-04-06 (×7): qty 1

## 2018-04-06 MED ORDER — SODIUM CHLORIDE 0.9 % IV SOLN: 100.0000 mL | INTRAVENOUS | Status: DC | PRN

## 2018-04-06 MED ORDER — FENTANYL CITRATE (PF) 100 MCG/2ML IJ SOLN
12.5000 ug | Freq: Once | INTRAMUSCULAR | Status: AC
  Administered 2018-04-06: 12.5 ug via INTRAVENOUS

## 2018-04-06 MED ORDER — DARBEPOETIN ALFA 150 MCG/0.3ML IJ SOSY
150.0000 ug | PREFILLED_SYRINGE | INTRAMUSCULAR | Status: DC
  Filled 2018-04-06: qty 0.3

## 2018-04-06 MED ORDER — FENTANYL CITRATE (PF) 100 MCG/2ML IJ SOLN
INTRAMUSCULAR | Status: AC
  Administered 2018-04-06: 12.5 ug via INTRAVENOUS
  Filled 2018-04-06: qty 2

## 2018-04-06 MED ORDER — POTASSIUM CHLORIDE CRYS ER 20 MEQ PO TBCR
40.0000 meq | EXTENDED_RELEASE_TABLET | Freq: Once | ORAL | Status: AC
  Administered 2018-04-06: 40 meq via ORAL
  Filled 2018-04-06: qty 2

## 2018-04-06 NOTE — Progress Notes (Signed)
[place] TEAM 1 - Stepdown/ICU TEAM  Eric Murillo  ZOX:[id number] DOB: 1960-10-18 DOA: Apr 12, 2018 PCP: Synetta Shadow, MD    Brief Narrative:  57 y.o. male with a hx of nonischemic cardiomyopathy with EF of 20-25%, ESRD on dialysis, and defibrillator placement who presented with syncope. He got up to let his girlfriend in the door, and was walking back to the couch when he had an abrupt syncopal episode. He saw his cardiologist Dr. Graciela Husbands recently who started him mexiline.  His ICD was interrogated in the ED and revealed V. tach coincident with the timing of his syncope, and an appropriate defib shock.    After transfer to [facility] the pt suffered another episode of monomorphic VT, terminated by ATP.    Significant Events: 9/12 admit for VT - syncope   Subjective: Chart reviewed.  Medical hx noted.  CHF and Nephrology teams cont to address all active issues.    Assessment & Plan:  VT w/ syncope  Chronic nonischemic systolic CHF   ESRD on HD T/Th/Sat  Morbid obesity - Body mass index is 40.74 kg/m.  HTN  HLD  DVT prophylaxis: eliquis Code Status: FULL CODE Family Communication:  Disposition Plan:   Consultants:  [facility]   Antimicrobials:  none  Objective: Blood pressure 93/68, pulse 87, temperature 97.9 F (36.6 C), temperature source Oral, resp. rate 19, height 5\' 9"  (1.753 m), weight 125.1 kg, SpO2 97 %.  Intake/Output Summary (Last 24 hours) at 04/06/2018 1342 Last data filed at 04/06/2018 1200 Gross per 24 hour  Intake 1111.59 ml  Output 725 ml  Net 386.59 ml   Filed Weights   2018/04/12 1734 04/05/18 0103 04/06/18 0500  Weight: (!) 170.1 kg 124.6 kg 125.1 kg    Examination: No exam by [facility] today   CBC: Recent Labs  Lab Apr 12, 2018 1833 04/05/18 1730 04/06/18 0529  WBC 10.0 9.0 8.7  HGB 11.4* 10.7* 10.8*  HCT 36.5* 34.3* 35.4*  MCV 95.3 95.5 96.5  PLT 191 202 199   Basic Metabolic Panel: Recent Labs  Lab 04-12-18 1833 04/05/18 1730  04/06/18 0529  NA 136 139 136  K 3.5 3.9 4.1  CL 96* 98 99  CO2 29 26 26   GLUCOSE 108* 100* 111*  BUN 44* 52* 56*  CREATININE 6.94* 6.13* 6.03*  CALCIUM 8.8* 8.7* 8.8*  MG  --  2.5* 2.7*  PHOS  --   --  3.9   GFR: Estimated Creatinine Clearance: 17.7 mL/min (A) (by C-G formula based on SCr of 6.03 mg/dL (H)).  Liver Function Tests: Recent Labs  Lab 04/06/18 0529  ALBUMIN 3.3*   No results for input(s): LIPASE, AMYLASE in the last 168 hours. No results for input(s): AMMONIA in the last 168 hours.  Coagulation Profile: No results for input(s): INR, PROTIME in the last 168 hours.  Cardiac Enzymes: No results for input(s): CKTOTAL, CKMB, CKMBINDEX, TROPONINI in the last 168 hours.  HbA1C: Hemoglobin A1C  Date/Time Value Ref Range Status  07/10/2013 04:11 PM 5.4  Final  01/20/2013 04:28 PM 5.6  Final    CBG: Recent Labs  Lab 04/05/18 0624  GLUCAP 111*    Recent Results (from the past 240 hour(s))  MRSA PCR Screening     Status: None   Collection Time: 04/05/18  1:01 AM  Result Value Ref Range Status   MRSA by PCR NEGATIVE NEGATIVE Final    Comment:        The GeneXpert MRSA Assay (FDA approved for NASAL  specimens only), is one component of a comprehensive MRSA colonization surveillance program. It is not intended to diagnose MRSA infection nor to guide or monitor treatment for MRSA infections. Performed at [facility], [address], [place], [place] [address]      Scheduled Meds: . allopurinol  100 mg Oral BID  . calcitRIOL  0.5 mcg Oral Q T,Th,Sa-HD  . calcium acetate  1,334 mg Oral TID WC  . Chlorhexidine Gluconate Cloth  6 each Topical Q0600  . darbepoetin (ARANESP) injection - DIALYSIS  150 mcg Intravenous Q Sat-HD  . mexiletine  150 mg Oral Q8H  . multivitamin  1 tablet Oral QHS  . sodium chloride flush  3 mL Intravenous Q12H   Continuous Infusions: . amiodarone 30 mg/hr (04/06/18 1200)  . heparin 1,250 Units/hr (04/06/18 1200)  .  lidocaine 1 mg/min (04/06/18 1200)     LOS: 2 days   [name], MD [facility] Office  [phone] Pager - Text Page per Amion  If 7PM-7AM, please contact night-coverage per Amion 04/06/2018, 1:42 PM

## 2018-04-06 NOTE — Procedures (Signed)
Central Venous Catheter Insertion Procedure Note Eric Murillo [id number] [birth year]  Procedure: Insertion of Central Venous Catheter Indications: Assessment of intravascular volume, Drug and/or fluid administration and Frequent blood sampling  Procedure Details Consent: Risks of procedure as well as the alternatives and risks of each were explained to the (patient/caregiver).  Consent for procedure obtained. Time Out: Verified patient identification, verified procedure, site/side was marked, verified correct patient position, special equipment/implants available, medications/allergies/relevent history reviewed, required imaging and test results available.  Performed  Maximum sterile technique was used including antiseptics, cap, gloves, gown, hand hygiene, mask and sheet. Skin prep: Chlorhexidine; local anesthetic administered A antimicrobial bonded/coated triple lumen catheter was placed in the left internal jugular vein using the Seldinger technique.  Evaluation Blood flow good Complications: No apparent complications Patient did tolerate procedure well. Chest X-ray ordered to verify placement.  CXR: pending.  Eric Murillo Eric Murillo 04/06/2018, 2:04 AM

## 2018-04-06 NOTE — Progress Notes (Signed)
ANTICOAGULATION CONSULT NOTE   Pharmacy Consult for heparin Indication: atrial fibrillation  Allergies  Allergen Reactions  . Nsaids Other (See Comments)    Cannot take to due kidney issues  . Beta Adrenergic Blockers Other (See Comments)    An unnamed, white-colored Beta Blocker made him "feel funny" = made him feel "cagey"    Patient Measurements: Height: 5\' 9"  (175.3 cm) Weight: 271 lb 2.7 oz (123 kg)(stand up weight) IBW/kg (Calculated) : 70.7  HEPARIN DW (KG): 99.2  Vital Signs: Temp: 98 F (36.7 C) (09/14 2000) Temp Source: Oral (09/14 2000) BP: 107/65 (09/14 2000) Pulse Rate: 92 (09/14 2000)  Labs: Recent Labs    April 05, 2018 1833 04/05/18 1730 04/06/18 0529 04/06/18 1049 04/06/18 1952  HGB 11.4* 10.7* 10.8*  --   --   HCT 36.5* 34.3* 35.4*  --   --   PLT 191 202 199  --   --   APTT  --  35  --  74* 82*  HEPARINUNFRC  --   --   --  1.23*  --   CREATININE 6.94* 6.13* 6.03*  --  3.48*    Estimated Creatinine Clearance: 30.3 mL/min (A) (by C-G formula based on SCr of 3.48 mg/dL (H)).   Medical History: Past Medical History:  Diagnosis Date  . AICD (automatic cardioverter/defibrillator) present   . Anemia of chronic disease   . Arthritis   . Atrial flutter (HCC)    DCCV 9/15  . Chest pain on exertion   . Chronic systolic congestive heart failure, NYHA class 2 (HCC)    EF 20-25% 1/16  . CKD (chronic kidney disease) stage 3, GFR 30-59 ml/min (HCC)   . Dyslipidemia   . Gout    Of big toe  . Hyperglycemia   . Hyperlipidemia   . Hypertension   . Morbid obesity with BMI of 45.0-49.9, adult (HCC)   . Non-ischemic cardiomyopathy (HCC)   . Organic erectile dysfunction   . OSA (obstructive sleep apnea)   . Pilonidal cyst   . Submandibular sialolithiasis    Right    Assessment: Eric Murillo is a 57 year old male with hx of non-ischemic cardiomyopathy, aflutter, ESRD on HD. He presented to [facility] ED after a syncopal episode/ICD shock for monomorphic VT.  Transferred to [facility] ED after an episode of VT that required ATP. PTA apixaban regimen is 2.5mg  BID for recurrent atrial flutter. Plan is for [facility] Monday 9/16.  Confirmatory aPTT came back therapeutic at 82, on 1250 units/hr. Hgb 10.8, plt 199. No s/sx of bleeding. No infusion issues.    Goal of Therapy:  Heparin level 0.3-0.7 units/ml aPTT 66-102 seconds Monitor platelets by anticoagulation protocol: Yes   Plan:   Continue IV heparin at current rate of 1250 units/hr  Monitor daily heparin level, aPTT, s/sx of bleeding  Thank you for involving pharmacy in this patient's care.  Girard Cooter, PharmD Clinical Pharmacist  Pager: [phone] Phone: [phone]  04/06/2018 8:55 PM

## 2018-04-06 NOTE — Progress Notes (Signed)
Subjective: Interval History: has no complaint , had recurrent VT last pm.  Objective: Vital signs in last 24 hours: Temp:  [97.9 F (36.6 C)-99 F (37.2 C)] 98.8 F (37.1 C) (09/14 0400) Pulse Rate:  [86-103] 87 (09/14 0441) Resp:  [13-22] 19 (09/14 0441) BP: (93-122)/(60-97) 93/68 (09/14 0441) SpO2:  [95 %-100 %] 97 % (09/14 0441) Weight:  [125.1 kg] 125.1 kg (09/14 0500) Weight change: -45 kg  Intake/Output from previous day: 09/13 0701 - 09/14 0700 In: 1692.7 [P.O.:1060; I.V.:632.7] Out: 675 [Urine:675] Intake/Output this shift: No intake/output data recorded.  General appearance: alert, cooperative, no distress and morbidly obese Resp: diminished breath sounds bilaterally Cardio: S1, S2 normal and systolic murmur: systolic ejection 2/6, crescendo and decrescendo at 2nd left intercostal space GI: massive obese, pos bs, nontender Extremities: edema 2-3+ and AVF RUA  Lab Results: Recent Labs    04/05/18 1730 04/06/18 0529  WBC 9.0 8.7  HGB 10.7* 10.8*  HCT 34.3* 35.4*  PLT 202 199   BMET:  Recent Labs    04/05/18 1730 04/06/18 0529  NA 139 136  K 3.9 4.1  CL 98 99  CO2 26 26  GLUCOSE 100* 111*  BUN 52* 56*  CREATININE 6.13* 6.03*  CALCIUM 8.7* 8.8*   No results for input(s): PTH in the last 72 hours. Iron Studies: No results for input(s): IRON, TIBC, TRANSFERRIN, FERRITIN in the last 72 hours.  Studies/Results: Dg Chest Port 1 View  Result Date: 04/06/2018 CLINICAL DATA:  Central line plmt EXAM: PORTABLE CHEST 1 VIEW COMPARISON:  03/25/2018 FINDINGS: Pacer/AICD device. Interval placement of a left internal jugular line. This terminates at the high SVC. Moderate right hemidiaphragm elevation. Midline trachea. Mild cardiomegaly. No pleural effusion or pneumothorax. Suspect mild pulmonary venous congestion. No lobar consolidation. IMPRESSION: Left internal jugular line tip at high SVC; no pneumothorax. Cardiomegaly with developing mild pulmonary venous  congestion. Electronically Signed   By: Jeronimo Greaves M.D.   On: 04/06/2018 02:28   Dg Chest Port 1 View  Result Date: 04/22/2018 CLINICAL DATA:  Patient states he has had syncopal episodes today and jerked and felt like his ICD went off. Hx of AICD, atrial flutter, HTN, and cardiac cath. EXAM: PORTABLE CHEST 1 VIEW COMPARISON:  Chest x-ray dated 03/26/2018. FINDINGS: Stable cardiomegaly. Lungs are clear. No pleural effusion or pneumothorax. LEFT chest wall pacemaker apparatus appears stable. Osseous structures about the chest are unremarkable. IMPRESSION: No active disease.  Stable cardiomegaly. Electronically Signed   By: Bary Richard M.D.   On: 04/18/2018 20:32    I have reviewed the patient's current medications.  Assessment/Plan: 1 ESRD for HD, using added K  2 Anemia esa 3 DM controlled 4 VT per cards 5  Massive obesity 6 HPTH vit D P HD, esa, Vit D     LOS: 2 days   Eric Murillo 04/06/2018,7:15 AM

## 2018-04-06 NOTE — Progress Notes (Signed)
Call received from lab with results of stat lidocaine level drawn at 5:35 am today: 3.4  - Lil Lepage,RN

## 2018-04-06 NOTE — Progress Notes (Signed)
Pt with 27 beats of NSVT. Pt found seated at this side of the bed, promoted feeling dizzy. MD on call notified and BP was 104/76 with MAP of 85 at this time. Will continue to monitor.

## 2018-04-06 NOTE — Progress Notes (Signed)
Lidocaine Level reported to be >30. MD on call notified and recollect ordered. Will monitor for new lidocaine results.

## 2018-04-06 NOTE — Progress Notes (Addendum)
Progress Note  Patient Name: Eric Murillo Date of Encounter: 04/06/2018  Primary Cardiologist: [name], MD   Subjective   Denies chest pain or shortness of breath.  Complains of soreness in his left neck.  Inpatient Medications    Scheduled Meds: . allopurinol  100 mg Oral BID  . calcitRIOL  0.5 mcg Oral Q T,Th,Sa-HD  . calcium acetate  1,334 mg Oral TID WC  . Chlorhexidine Gluconate Cloth  6 each Topical Q0600  . darbepoetin (ARANESP) injection - DIALYSIS  150 mcg Intravenous Q Sat-HD  . multivitamin  1 tablet Oral QHS  . sodium chloride flush  3 mL Intravenous Q12H   Continuous Infusions: . amiodarone 30 mg/hr (04/06/18 0800)  . heparin 1,250 Units/hr (04/06/18 0800)  . lidocaine 1 mg/min (04/06/18 0800)   PRN Meds: acetaminophen **OR** [DISCONTINUED] acetaminophen, bismuth subsalicylate, HYDROcodone-acetaminophen, promethazine, senna-docusate   Vital Signs    Vitals:   04/06/18 0400 04/06/18 0441 04/06/18 0500 04/06/18 0815  BP:  93/68    Pulse:  87    Resp:  19    Temp: 98.8 F (37.1 C)   (!) 97.3 F (36.3 C)  TempSrc: Oral   Oral  SpO2:  97%    Weight:   125.1 kg   Height:        Intake/Output Summary (Last 24 hours) at 04/06/2018 1100 Last data filed at 04/06/2018 0800 Gross per 24 hour  Intake 1436.86 ml  Output 725 ml  Net 711.86 ml   Filed Weights   04/16/2018 1734 04/05/18 0103 04/06/18 0500  Weight: (!) 170.1 kg 124.6 kg 125.1 kg    Telemetry    Normal sinus rhythm with PVCs and nonsustained ventricular tachycardia- Personally Reviewed  ECG    None- Personally Reviewed  Physical Exam   GEN: No acute distress.   Neck: No JVD Cardiac: RRR, no murmurs, rubs, or gallops.  Respiratory: Clear to auscultation bilaterally. GI: Soft, nontender, non-distended  MS: No edema; No deformity. Neuro:  Nonfocal  Psych: Normal affect   Labs    Chemistry Recent Labs  Lab 04/20/2018 1833 04/05/18 1730 04/06/18 0529  NA 136 139 136  K  3.5 3.9 4.1  CL 96* 98 99  CO2 29 26 26   GLUCOSE 108* 100* 111*  BUN 44* 52* 56*  CREATININE 6.94* 6.13* 6.03*  CALCIUM 8.8* 8.7* 8.8*  ALBUMIN  --   --  3.3*  GFRNONAA 8* 9* 9*  GFRAA 9* 11* 11*  ANIONGAP 11 15 11      Hematology Recent Labs  Lab 04/18/2018 1833 04/05/18 1730 04/06/18 0529  WBC 10.0 9.0 8.7  RBC 3.83* 3.59* 3.67*  HGB 11.4* 10.7* 10.8*  HCT 36.5* 34.3* 35.4*  MCV 95.3 95.5 96.5  MCH 29.8 29.8 29.4  MCHC 31.2 31.2 30.5  RDW 14.5 14.2 14.1  PLT 191 202 199    Cardiac EnzymesNo results for input(s): TROPONINI in the last 168 hours.  Recent Labs  Lab 04/07/2018 2135  TROPIPOC 0.02     BNPNo results for input(s): BNP, PROBNP in the last 168 hours.   DDimer No results for input(s): DDIMER in the last 168 hours.   Radiology    Dg Chest Port 1 View  Result Date: 04/06/2018 CLINICAL DATA:  Central line plmt EXAM: PORTABLE CHEST 1 VIEW COMPARISON:  04/20/2018 FINDINGS: Pacer/AICD device. Interval placement of a left internal jugular line. This terminates at the high SVC. Moderate right hemidiaphragm elevation. Midline trachea. Mild cardiomegaly. No pleural effusion or  pneumothorax. Suspect mild pulmonary venous congestion. No lobar consolidation. IMPRESSION: Left internal jugular line tip at high SVC; no pneumothorax. Cardiomegaly with developing mild pulmonary venous congestion. Electronically Signed   By: Jeronimo Greaves M.D.   On: 04/06/2018 02:28   Dg Chest Port 1 View  Result Date: 2018/05/02 CLINICAL DATA:  Patient states he has had syncopal episodes today and jerked and felt like his ICD went off. Hx of AICD, atrial flutter, HTN, and cardiac cath. EXAM: PORTABLE CHEST 1 VIEW COMPARISON:  Chest x-ray dated 03/26/2018. FINDINGS: Stable cardiomegaly. Lungs are clear. No pleural effusion or pneumothorax. LEFT chest wall pacemaker apparatus appears stable. Osseous structures about the chest are unremarkable. IMPRESSION: No active disease.  Stable cardiomegaly.  Electronically Signed   By: Bary Richard M.D.   On: 05/02/2018 20:32    Cardiac Studies   None  Patient Profile     57 y.o. male admitted with heart failure and ventricular tachycardia.  His ventricular arrhythmias have improved with intravenous antiarrhythmic drug therapy.  Assessment & Plan    1.  Ventricular tachycardia -he has had both monomorphic and polymorphic ventricular tachycardia.  He will continue intravenous amiodarone.  We will stop the intravenous lidocaine and transition him to mexiletine.  For the next 24 hours, we will overlap both mexiletine and intravenous lidocaine.  I intend to stop the intravenous lidocaine tomorrow. 2.  Chronic systolic heart failure -the patient remains severely ill.  He will continue his current medical therapy.  No plans for heart catheterization.     For questions or updates, please contact [facility] Please consult [email or web address] for contact info under Cardiology/STEMI.      Signed, Lewayne Bunting, MD  04/06/2018, 11:00 AM  Patient ID: Eric Murillo, male   DOB: 12-02-1960, 57 y.o.   MRN: [id number]

## 2018-04-06 NOTE — Progress Notes (Signed)
ANTICOAGULATION CONSULT NOTE   Pharmacy Consult for heparin Indication: atrial fibrillation  Allergies  Allergen Reactions  . Nsaids Other (See Comments)    Cannot take to due kidney issues  . Beta Adrenergic Blockers Other (See Comments)    An unnamed, white-colored Beta Blocker made him "feel funny" = made him feel "cagey"    Patient Measurements: Height: 5\' 9"  (175.3 cm) Weight: 275 lb 14.4 oz (125.1 kg) IBW/kg (Calculated) : 70.7  HEPARIN DW (KG): 99.2  Vital Signs: Temp: 97.3 F (36.3 C) (09/14 0815) Temp Source: Oral (09/14 0815) BP: 93/68 (09/14 0441) Pulse Rate: 87 (09/14 0441)  Labs: Recent Labs    04/22/2018 1833 04/05/18 1730 04/06/18 0529 04/06/18 1049  HGB 11.4* 10.7* 10.8*  --   HCT 36.5* 34.3* 35.4*  --   PLT 191 202 199  --   APTT  --  35  --  74*  HEPARINUNFRC  --   --   --  1.23*  CREATININE 6.94* 6.13* 6.03*  --     Estimated Creatinine Clearance: 17.7 mL/min (A) (by C-G formula based on SCr of 6.03 mg/dL (H)).   Medical History: Past Medical History:  Diagnosis Date  . AICD (automatic cardioverter/defibrillator) present   . Anemia of chronic disease   . Arthritis   . Atrial flutter (HCC)    DCCV 9/15  . Chest pain on exertion   . Chronic systolic congestive heart failure, NYHA class 2 (HCC)    EF 20-25% 1/16  . CKD (chronic kidney disease) stage 3, GFR 30-59 ml/min (HCC)   . Dyslipidemia   . Gout    Of big toe  . Hyperglycemia   . Hyperlipidemia   . Hypertension   . Morbid obesity with BMI of 45.0-49.9, adult (HCC)   . Non-ischemic cardiomyopathy (HCC)   . Organic erectile dysfunction   . OSA (obstructive sleep apnea)   . Pilonidal cyst   . Submandibular sialolithiasis    Right    Assessment: Eric Murillo is a 57 year old male with hx of non-ischemic cardiomyopathy, aflutter, ESRD on HD. He presented to [facility] ED after a syncopal episode/ICD shock for monomorphic VT. Transferred to [facility] ED after an episode of VT that required ATP.  PTA apixaban regimen is 2.5mg  BID for recurrent atrial flutter. Plan is for [facility] Monday 9/16.  Currently on IV heparin with aPTT at goal.  Heparin level falsely elevated from recent apixaban.  No overt bleeding or complications noted.  Goal of Therapy:  Heparin level 0.3-0.7 units/ml aPTT 66-102 seconds Monitor platelets by anticoagulation protocol: Yes   Plan:   Continue IV heparin at current rate Recheck aPTT in 8 hrs. Monitor daily heparin level, aPTT, s/sx of bleeding  Thank you for involving pharmacy in this patient's care.  Jenetta Downer, [facility] Clinical Pharmacist Phone [phone]  04/06/2018 12:31 PM

## 2018-04-07 LAB — CBC
HCT: 35.9 % — ABNORMAL LOW (ref 39.0–52.0)
Hemoglobin: 10.9 g/dL — ABNORMAL LOW (ref 13.0–17.0)
MCH: 29.3 pg (ref 26.0–34.0)
MCHC: 30.4 g/dL (ref 30.0–36.0)
MCV: 96.5 fL (ref 78.0–100.0)
PLATELETS: 196 10*3/uL (ref 150–400)
RBC: 3.72 MIL/uL — ABNORMAL LOW (ref 4.22–5.81)
RDW: 14.2 % (ref 11.5–15.5)
WBC: 11.2 10*3/uL — AB (ref 4.0–10.5)

## 2018-04-07 LAB — RENAL FUNCTION PANEL
Albumin: 3.3 g/dL — ABNORMAL LOW (ref 3.5–5.0)
Anion gap: 10 (ref 5–15)
BUN: 29 mg/dL — AB (ref 6–20)
CHLORIDE: 98 mmol/L (ref 98–111)
CO2: 27 mmol/L (ref 22–32)
CREATININE: 4.35 mg/dL — AB (ref 0.61–1.24)
Calcium: 8.7 mg/dL — ABNORMAL LOW (ref 8.9–10.3)
GFR calc Af Amer: 16 mL/min — ABNORMAL LOW (ref >60)
GFR calc non Af Amer: 14 mL/min — ABNORMAL LOW (ref >60)
GLUCOSE: 98 mg/dL (ref 70–99)
Phosphorus: 3.3 mg/dL (ref 2.5–4.6)
Potassium: 4.5 mmol/L (ref 3.5–5.1)
Sodium: 135 mmol/L (ref 135–145)

## 2018-04-07 LAB — HEPARIN LEVEL (UNFRACTIONATED): HEPARIN UNFRACTIONATED: 0.85 [IU]/mL — AB (ref 0.30–0.70)

## 2018-04-07 LAB — APTT: APTT: 96 s — AB (ref 24–36)

## 2018-04-07 LAB — MAGNESIUM: MAGNESIUM: 2.3 mg/dL (ref 1.7–2.4)

## 2018-04-07 NOTE — H&P (View-Only) (Signed)
Progress Note  Patient Name: Eric Murillo Date of Encounter: 04/07/2018  Primary Cardiologist: [name], MD   Subjective   Left neck still sore. No chest pain.   Inpatient Medications    Scheduled Meds: . allopurinol  100 mg Oral BID  . calcitRIOL  0.5 mcg Oral Q T,Th,Sa-HD  . calcium acetate  1,334 mg Oral TID WC  . Chlorhexidine Gluconate Cloth  6 each Topical Q0600  . darbepoetin (ARANESP) injection - DIALYSIS  150 mcg Intravenous Q Sat-HD  . mexiletine  150 mg Oral Q8H  . multivitamin  1 tablet Oral QHS  . sodium chloride flush  3 mL Intravenous Q12H   Continuous Infusions: . sodium chloride    . sodium chloride    . amiodarone 30 mg/hr (04/07/18 0415)  . heparin 1,250 Units/hr (04/07/18 0000)   PRN Meds: sodium chloride, sodium chloride, acetaminophen **OR** [DISCONTINUED] acetaminophen, bismuth subsalicylate, HYDROcodone-acetaminophen, pentafluoroprop-tetrafluoroeth, promethazine, senna-docusate   Vital Signs    Vitals:   04/07/18 0200 04/07/18 0300 04/07/18 0400 04/07/18 0500  BP: (!) 90/59 93/66 91/61  98/71  Pulse: 85 85 85 93  Resp: (!) 23 (!) 24 (!) 23 20  Temp:   98.4 F (36.9 C)   TempSrc:   Oral   SpO2: 95% 95% 99% 99%  Weight:    122.7 kg  Height:        Intake/Output Summary (Last 24 hours) at 04/07/2018 0901 Last data filed at 04/07/2018 0000 Gross per 24 hour  Intake 945.51 ml  Output 3922 ml  Net -2976.49 ml   Filed Weights   04/06/18 1400 04/06/18 1845 04/07/18 0500  Weight: 127 kg 123 kg 122.7 kg    Telemetry    nsr/pvcs - Personally Reviewed  ECG    none - Personally Reviewed  Physical Exam   GEN: No acute distress.   Neck: 8 cm JVD Cardiac: Reg tachy with S3, no murmurs, rubs, or gallops.  Respiratory: Clear to auscultation bilaterally. GI: Soft, nontender, non-distended  MS: No edema; No deformity. Neuro:  Nonfocal  Psych: Normal affect   Labs    Chemistry Recent Labs  Lab 04/06/18 0529 04/06/18 1952  04/07/18 0417  NA 136 134* 135  K 4.1 3.2* 4.5  CL 99 94* 98  CO2 26 25 27   GLUCOSE 111* 128* 98  BUN 56* 23* 29*  CREATININE 6.03* 3.48* 4.35*  CALCIUM 8.8* 8.5* 8.7*  ALBUMIN 3.3*  --  3.3*  GFRNONAA 9* 18* 14*  GFRAA 11* 21* 16*  ANIONGAP 11 15 10      Hematology Recent Labs  Lab 04/05/18 1730 04/06/18 0529 04/07/18 0417  WBC 9.0 8.7 11.2*  RBC 3.59* 3.67* 3.72*  HGB 10.7* 10.8* 10.9*  HCT 34.3* 35.4* 35.9*  MCV 95.5 96.5 96.5  MCH 29.8 29.4 29.3  MCHC 31.2 30.5 30.4  RDW 14.2 14.1 14.2  PLT 202 199 196    Cardiac EnzymesNo results for input(s): TROPONINI in the last 168 hours.  Recent Labs  Lab 03/24/2018 2135  TROPIPOC 0.02     BNPNo results for input(s): BNP, PROBNP in the last 168 hours.   DDimer No results for input(s): DDIMER in the last 168 hours.   Radiology    Dg Chest Port 1 View  Result Date: 04/06/2018 CLINICAL DATA:  Central line plmt EXAM: PORTABLE CHEST 1 VIEW COMPARISON:  04/18/2018 FINDINGS: Pacer/AICD device. Interval placement of a left internal jugular line. This terminates at the high SVC. Moderate right hemidiaphragm elevation. Midline trachea.  Mild cardiomegaly. No pleural effusion or pneumothorax. Suspect mild pulmonary venous congestion. No lobar consolidation. IMPRESSION: Left internal jugular line tip at high SVC; no pneumothorax. Cardiomegaly with developing mild pulmonary venous congestion. Electronically Signed   By: Jeronimo Greaves M.D.   On: 04/06/2018 02:28    Cardiac Studies   none  Patient Profile     57 y.o. male admitted with CHF and recurrent VT  Assessment & Plan    1. VT - we will stop IV lidocaine today, continue mexilitine and IV amio. I would anticipate transitioning him back to oral amio tomorrow 2. Acute on chronic systolic heart failure - he is pending left and right heart cath. 3. ESRD - he is tolerating HD     For questions or updates, please contact [facility] Please consult [email or web address] for contact  info under Cardiology/STEMI.  Signed, Lewayne Bunting, MD  04/07/2018, 9:01 AM  Patient ID: Eric Murillo, male   DOB: Oct 28, 1960, 57 y.o.   MRN: [id number]

## 2018-04-07 NOTE — Progress Notes (Signed)
Pt went into Vtach at HR of 220's while sitting on side of bed, finishing up his dinner and dialysis, pt became unresponsive and had black out, his ICD shock him and his HR immediately drop back to within normal means. Call Cards fellow and she order labs, K+3.3 gave pt 40 of K.

## 2018-04-07 NOTE — Progress Notes (Signed)
Subjective: Interval History: has no complaint . Snncope, ICD shock last pm  Objective: Vital signs in last 24 hours: Temp:  [97.3 F (36.3 C)-98.4 F (36.9 C)] 98.4 F (36.9 C) (09/15 0400) Pulse Rate:  [73-97] 93 (09/15 0500) Resp:  [14-29] 20 (09/15 0500) BP: (82-129)/(56-79) 98/71 (09/15 0500) SpO2:  [93 %-100 %] 99 % (09/15 0500) Weight:  [122.7 kg-127 kg] 122.7 kg (09/15 0500) Weight change: 1.853 kg  Intake/Output from previous day: 09/14 0701 - 09/15 0700 In: 989.7 [P.O.:240; I.V.:749.7] Out: 4172 [Urine:250] Intake/Output this shift: No intake/output data recorded.  General appearance: alert, cooperative, no distress and morbidly obese Resp: diminished breath sounds bilaterally Cardio: S1, S2 normal and systolic murmur: holosystolic 2/6, blowing at apex GI: obese, pos bs, soft Extremities: edema 2-3+  Lab Results: Recent Labs    04/06/18 0529 04/07/18 0417  WBC 8.7 11.2*  HGB 10.8* 10.9*  HCT 35.4* 35.9*  PLT 199 196   BMET:  Recent Labs    04/06/18 1952 04/07/18 0417  NA 134* 135  K 3.2* 4.5  CL 94* 98  CO2 25 27  GLUCOSE 128* 98  BUN 23* 29*  CREATININE 3.48* 4.35*  CALCIUM 8.5* 8.7*   No results for input(s): PTH in the last 72 hours. Iron Studies: No results for input(s): IRON, TIBC, TRANSFERRIN, FERRITIN in the last 72 hours.  Studies/Results: Dg Chest Port 1 View  Result Date: 04/06/2018 CLINICAL DATA:  Central line plmt EXAM: PORTABLE CHEST 1 VIEW COMPARISON:  05/01/2018 FINDINGS: Pacer/AICD device. Interval placement of a left internal jugular line. This terminates at the high SVC. Moderate right hemidiaphragm elevation. Midline trachea. Mild cardiomegaly. No pleural effusion or pneumothorax. Suspect mild pulmonary venous congestion. No lobar consolidation. IMPRESSION: Left internal jugular line tip at high SVC; no pneumothorax. Cardiomegaly with developing mild pulmonary venous congestion. Electronically Signed   By: Jeronimo Greaves M.D.   On:  04/06/2018 02:28    I have reviewed the patient's current medications.  Assessment/Plan: 1 ESRD did well on HD, Had 3 K bath, will use 4 k next tx Vol better 2 Anemai stable 3 Massive obesity 4 HPTH meds 5 VT per cards P HD TTS, lower vol, 4 k, antiarrhythmics   LOS: 3 days   Fayrene Fearing Abbee Cremeens 04/07/2018,7:21 AM

## 2018-04-07 NOTE — Progress Notes (Signed)
 Progress Note  Patient Name: Eric Murillo Date of Encounter: 04/07/2018  Primary Cardiologist: Dalton McLean, MD   Subjective   Left neck still sore. No chest pain.   Inpatient Medications    Scheduled Meds: . allopurinol  100 mg Oral BID  . calcitRIOL  0.5 mcg Oral Q T,Th,Sa-HD  . calcium acetate  1,334 mg Oral TID WC  . Chlorhexidine Gluconate Cloth  6 each Topical Q0600  . darbepoetin (ARANESP) injection - DIALYSIS  150 mcg Intravenous Q Sat-HD  . mexiletine  150 mg Oral Q8H  . multivitamin  1 tablet Oral QHS  . sodium chloride flush  3 mL Intravenous Q12H   Continuous Infusions: . sodium chloride    . sodium chloride    . amiodarone 30 mg/hr (04/07/18 0415)  . heparin 1,250 Units/hr (04/07/18 0000)   PRN Meds: sodium chloride, sodium chloride, acetaminophen **OR** [DISCONTINUED] acetaminophen, bismuth subsalicylate, HYDROcodone-acetaminophen, pentafluoroprop-tetrafluoroeth, promethazine, senna-docusate   Vital Signs    Vitals:   04/07/18 0200 04/07/18 0300 04/07/18 0400 04/07/18 0500  BP: (!) 90/59 93/66 91/61 98/71  Pulse: 85 85 85 93  Resp: (!) 23 (!) 24 (!) 23 20  Temp:   98.4 F (36.9 C)   TempSrc:   Oral   SpO2: 95% 95% 99% 99%  Weight:    122.7 kg  Height:        Intake/Output Summary (Last 24 hours) at 04/07/2018 0901 Last data filed at 04/07/2018 0000 Gross per 24 hour  Intake 945.51 ml  Output 3922 ml  Net -2976.49 ml   Filed Weights   04/06/18 1400 04/06/18 1845 04/07/18 0500  Weight: 127 kg 123 kg 122.7 kg    Telemetry    nsr/pvcs - Personally Reviewed  ECG    none - Personally Reviewed  Physical Exam   GEN: No acute distress.   Neck: 8 cm JVD Cardiac: Reg tachy with S3, no murmurs, rubs, or gallops.  Respiratory: Clear to auscultation bilaterally. GI: Soft, nontender, non-distended  MS: No edema; No deformity. Neuro:  Nonfocal  Psych: Normal affect   Labs    Chemistry Recent Labs  Lab 04/06/18 0529 04/06/18 1952  04/07/18 0417  NA 136 134* 135  K 4.1 3.2* 4.5  CL 99 94* 98  CO2 26 25 27  GLUCOSE 111* 128* 98  BUN 56* 23* 29*  CREATININE 6.03* 3.48* 4.35*  CALCIUM 8.8* 8.5* 8.7*  ALBUMIN 3.3*  --  3.3*  GFRNONAA 9* 18* 14*  GFRAA 11* 21* 16*  ANIONGAP 11 15 10     Hematology Recent Labs  Lab 04/05/18 1730 04/06/18 0529 04/07/18 0417  WBC 9.0 8.7 11.2*  RBC 3.59* 3.67* 3.72*  HGB 10.7* 10.8* 10.9*  HCT 34.3* 35.4* 35.9*  MCV 95.5 96.5 96.5  MCH 29.8 29.4 29.3  MCHC 31.2 30.5 30.4  RDW 14.2 14.1 14.2  PLT 202 199 196    Cardiac EnzymesNo results for input(s): TROPONINI in the last 168 hours.  Recent Labs  Lab 03/30/2018 2135  TROPIPOC 0.02     BNPNo results for input(s): BNP, PROBNP in the last 168 hours.   DDimer No results for input(s): DDIMER in the last 168 hours.   Radiology    Dg Chest Port 1 View  Result Date: 04/06/2018 CLINICAL DATA:  Central line plmt EXAM: PORTABLE CHEST 1 VIEW COMPARISON:  04/06/2018 FINDINGS: Pacer/AICD device. Interval placement of a left internal jugular line. This terminates at the high SVC. Moderate right hemidiaphragm elevation. Midline trachea.   Mild cardiomegaly. No pleural effusion or pneumothorax. Suspect mild pulmonary venous congestion. No lobar consolidation. IMPRESSION: Left internal jugular line tip at high SVC; no pneumothorax. Cardiomegaly with developing mild pulmonary venous congestion. Electronically Signed   By: Kyle  Talbot M.D.   On: 04/06/2018 02:28    Cardiac Studies   none  Patient Profile     57 y.o. male admitted with CHF and recurrent VT  Assessment & Plan    1. VT - we will stop IV lidocaine today, continue mexilitine and IV amio. I would anticipate transitioning him back to oral amio tomorrow 2. Acute on chronic systolic heart failure - he is pending left and right heart cath. 3. ESRD - he is tolerating HD     For questions or updates, please contact [facility] Please consult [email or web address] for contact  info under Cardiology/STEMI.  Signed, Cilicia Borden, MD  04/07/2018, 9:01 AM  Patient ID: Eric Murillo, male   DOB: 07/19/1961, 57 y.o.   MRN: [id number]  

## 2018-04-07 NOTE — Progress Notes (Signed)
[facility] TEAM 1 - Stepdown/ICU TEAM  Eric Murillo  FBP:[id number] DOB: [birth year] DOA: May 04, 2018 PCP: Synetta Shadow, MD    Brief Narrative:  57 y.o. male with a hx of nonischemic cardiomyopathy with EF of 20-25%, ESRD on dialysis, and defibrillator placement who presented with syncope. He got up to let his girlfriend in the door, and was walking back to the couch when he had an abrupt syncopal episode. He saw his cardiologist Dr. Graciela Husbands recently who started him mexiline.  His ICD was interrogated in the ED and revealed V. tach coincident with the timing of his syncope, and an appropriate defib shock.    After transfer to [facility] the pt suffered another episode of monomorphic VT, terminated by ATP.    Significant Events: 9/12 admit for VT - syncope   Subjective: Chart reviewed.  Medical hx noted.  EP and Nephrology teams cont to address all active issues.    Assessment & Plan:  VT w/ syncope  Chronic nonischemic systolic CHF   ESRD on HD T/Th/Sat  Morbid obesity - Body mass index is 39.95 kg/m.  HTN  HLD  DVT prophylaxis: eliquis Code Status: FULL CODE Family Communication:  Disposition Plan:   Consultants:  [facility]   Antimicrobials:  none  Objective: Blood pressure 92/77, pulse 91, temperature 98 F (36.7 C), temperature source Oral, resp. rate 17, height 5\' 9"  (1.753 m), weight 122.7 kg, SpO2 96 %.  Intake/Output Summary (Last 24 hours) at 04/07/2018 1142 Last data filed at 04/07/2018 1100 Gross per 24 hour  Intake 1402.75 ml  Output 3922 ml  Net -2519.25 ml   Filed Weights   04/06/18 1400 04/06/18 1845 04/07/18 0500  Weight: 127 kg 123 kg 122.7 kg    Examination: No exam by [facility] today   CBC: Recent Labs  Lab 04/05/18 1730 04/06/18 0529 04/07/18 0417  WBC 9.0 8.7 11.2*  HGB 10.7* 10.8* 10.9*  HCT 34.3* 35.4* 35.9*  MCV 95.5 96.5 96.5  PLT 202 199 196   Basic Metabolic Panel: Recent Labs  Lab 04/06/18 0529 04/06/18 1952  04/07/18 0417  NA 136 134* 135  K 4.1 3.2* 4.5  CL 99 94* 98  CO2 26 25 27   GLUCOSE 111* 128* 98  BUN 56* 23* 29*  CREATININE 6.03* 3.48* 4.35*  CALCIUM 8.8* 8.5* 8.7*  MG 2.7* 2.1 2.3  PHOS 3.9  --  3.3   GFR: Estimated Creatinine Clearance: 24.2 mL/min (A) (by C-G formula based on SCr of 4.35 mg/dL (H)).  Liver Function Tests: Recent Labs  Lab 04/06/18 0529 04/07/18 0417  ALBUMIN 3.3* 3.3*   No results for input(s): LIPASE, AMYLASE in the last 168 hours. No results for input(s): AMMONIA in the last 168 hours.  Coagulation Profile: No results for input(s): INR, PROTIME in the last 168 hours.  Cardiac Enzymes: No results for input(s): CKTOTAL, CKMB, CKMBINDEX, TROPONINI in the last 168 hours.  HbA1C: Hemoglobin A1C  Date/Time Value Ref Range Status  07/10/2013 04:11 PM 5.4  Final  01/20/2013 04:28 PM 5.6  Final    CBG: Recent Labs  Lab 04/05/18 0624  GLUCAP 111*    Recent Results (from the past 240 hour(s))  MRSA PCR Screening     Status: None   Collection Time: 04/05/18  1:01 AM  Result Value Ref Range Status   MRSA by PCR NEGATIVE NEGATIVE Final    Comment:        The GeneXpert MRSA Assay (FDA approved for NASAL specimens only),  is one component of a comprehensive MRSA colonization surveillance program. It is not intended to diagnose MRSA infection nor to guide or monitor treatment for MRSA infections. Performed at [facility], [address], [place], [place] [address]      Scheduled Meds: . allopurinol  100 mg Oral BID  . calcitRIOL  0.5 mcg Oral Q T,Th,Sa-HD  . calcium acetate  1,334 mg Oral TID WC  . Chlorhexidine Gluconate Cloth  6 each Topical Q0600  . darbepoetin (ARANESP) injection - DIALYSIS  150 mcg Intravenous Q Sat-HD  . mexiletine  150 mg Oral Q8H  . multivitamin  1 tablet Oral QHS  . sodium chloride flush  3 mL Intravenous Q12H   Continuous Infusions: . sodium chloride    . sodium chloride    . amiodarone 30 mg/hr  (04/07/18 1100)  . heparin 1,250 Units/hr (04/07/18 1100)     LOS: 3 days   [name], MD [facility] Office  [phone] Pager - Text Page per Amion  If 7PM-7AM, please contact night-coverage per Amion 04/07/2018, 11:42 AM

## 2018-04-07 NOTE — Progress Notes (Signed)
ANTICOAGULATION CONSULT NOTE   Pharmacy Consult for heparin Indication: atrial fibrillation  Allergies  Allergen Reactions  . Nsaids Other (See Comments)    Cannot take to due kidney issues  . Beta Adrenergic Blockers Other (See Comments)    An unnamed, white-colored Beta Blocker made him "feel funny" = made him feel "cagey"    Patient Measurements: Height: 5\' 9"  (175.3 cm) Weight: 270 lb 8.1 oz (122.7 kg) IBW/kg (Calculated) : 70.7  HEPARIN DW (KG): 99.2  Vital Signs: Temp: 98 F (36.7 C) (09/15 0810) Temp Source: Oral (09/15 0810) BP: 92/77 (09/15 1100) Pulse Rate: 91 (09/15 1100)  Labs: Recent Labs    04/05/18 1730 04/06/18 0529 04/06/18 1049 04/06/18 1952 04/07/18 0417  HGB 10.7* 10.8*  --   --  10.9*  HCT 34.3* 35.4*  --   --  35.9*  PLT 202 199  --   --  196  APTT 35  --  74* 82* 96*  HEPARINUNFRC  --   --  1.23*  --  0.85*  CREATININE 6.13* 6.03*  --  3.48* 4.35*    Estimated Creatinine Clearance: 24.2 mL/min (A) (by C-G formula based on SCr of 4.35 mg/dL (H)).   Medical History: Past Medical History:  Diagnosis Date  . AICD (automatic cardioverter/defibrillator) present   . Anemia of chronic disease   . Arthritis   . Atrial flutter (HCC)    DCCV 9/15  . Chest pain on exertion   . Chronic systolic congestive heart failure, NYHA class 2 (HCC)    EF 20-25% 1/16  . CKD (chronic kidney disease) stage 3, GFR 30-59 ml/min (HCC)   . Dyslipidemia   . Gout    Of big toe  . Hyperglycemia   . Hyperlipidemia   . Hypertension   . Morbid obesity with BMI of 45.0-49.9, adult (HCC)   . Non-ischemic cardiomyopathy (HCC)   . Organic erectile dysfunction   . OSA (obstructive sleep apnea)   . Pilonidal cyst   . Submandibular sialolithiasis    Right    Assessment: Eric Murillo is a 57 year old male with hx of non-ischemic cardiomyopathy, aflutter, ESRD on HD. He presented to [facility] ED after a syncopal episode/ICD shock for monomorphic VT. Transferred to [facility] ED  after an episode of VT that required ATP. PTA apixaban regimen is 2.5mg  BID for recurrent atrial flutter. Plan is for [facility] Monday 9/16.  Aptt at goal today.  Heparin level trending down with DOAC washout. No s/sx of bleeding. No infusion issues.    Goal of Therapy:  Heparin level 0.3-0.7 units/ml aPTT 66-102 seconds Monitor platelets by anticoagulation protocol: Yes   Plan:   Continue IV heparin at current rate of 1250 units/hr  Monitor daily heparin level, aPTT, s/sx of bleeding F/u plans to resume oral anticoagulation after cath.  Thank you for involving pharmacy in this patient's care.  Jenetta Downer, [facility] Clinical Pharmacist Phone [phone]  04/07/2018 11:10 AM

## 2018-04-08 ENCOUNTER — Encounter (HOSPITAL_COMMUNITY): Admission: EM | Disposition: E | Payer: Self-pay | Source: Home / Self Care | Attending: Cardiology

## 2018-04-08 ENCOUNTER — Encounter (HOSPITAL_COMMUNITY): Payer: Self-pay | Admitting: Cardiology

## 2018-04-08 DIAGNOSIS — I5043 Acute on chronic combined systolic (congestive) and diastolic (congestive) heart failure: Secondary | ICD-10-CM

## 2018-04-08 DIAGNOSIS — I428 Other cardiomyopathies: Secondary | ICD-10-CM

## 2018-04-08 DIAGNOSIS — I5022 Chronic systolic (congestive) heart failure: Secondary | ICD-10-CM

## 2018-04-08 HISTORY — PX: RIGHT/LEFT HEART CATH AND CORONARY ANGIOGRAPHY: CATH118266

## 2018-04-08 LAB — POCT I-STAT 3, VENOUS BLOOD GAS (G3P V)
Acid-Base Excess: 1 mmol/L (ref 0.0–2.0)
BICARBONATE: 25.1 mmol/L (ref 20.0–28.0)
Bicarbonate: 26.1 mmol/L (ref 20.0–28.0)
O2 Saturation: 51 %
O2 Saturation: 53 %
PCO2 VEN: 41.1 mmHg — AB (ref 44.0–60.0)
PCO2 VEN: 42.3 mmHg — AB (ref 44.0–60.0)
PH VEN: 7.398 (ref 7.250–7.430)
PO2 VEN: 27 mmHg — AB (ref 32.0–45.0)
PO2 VEN: 28 mmHg — AB (ref 32.0–45.0)
TCO2: 27 mmol/L (ref 22–32)
TCO2: 26 mmol/L (ref 22–32)
pH, Ven: 7.393 (ref 7.250–7.430)

## 2018-04-08 LAB — CBC
HCT: 35.9 % — ABNORMAL LOW (ref 39.0–52.0)
HEMOGLOBIN: 11 g/dL — AB (ref 13.0–17.0)
MCH: 29.3 pg (ref 26.0–34.0)
MCHC: 30.6 g/dL (ref 30.0–36.0)
MCV: 95.7 fL (ref 78.0–100.0)
PLATELETS: 199 10*3/uL (ref 150–400)
RBC: 3.75 MIL/uL — AB (ref 4.22–5.81)
RDW: 14.2 % (ref 11.5–15.5)
WBC: 10 10*3/uL (ref 4.0–10.5)

## 2018-04-08 LAB — POCT ACTIVATED CLOTTING TIME: Activated Clotting Time: 147 seconds

## 2018-04-08 LAB — RENAL FUNCTION PANEL
ALBUMIN: 3.3 g/dL — AB (ref 3.5–5.0)
ANION GAP: 14 (ref 5–15)
BUN: 44 mg/dL — AB (ref 6–20)
CALCIUM: 9.4 mg/dL (ref 8.9–10.3)
CO2: 25 mmol/L (ref 22–32)
Chloride: 95 mmol/L — ABNORMAL LOW (ref 98–111)
Creatinine, Ser: 6.28 mg/dL — ABNORMAL HIGH (ref 0.61–1.24)
GFR calc Af Amer: 10 mL/min — ABNORMAL LOW (ref >60)
GFR calc non Af Amer: 9 mL/min — ABNORMAL LOW (ref >60)
Glucose, Bld: 106 mg/dL — ABNORMAL HIGH (ref 70–99)
PHOSPHORUS: 4.5 mg/dL (ref 2.5–4.6)
Potassium: 4.3 mmol/L (ref 3.5–5.1)
SODIUM: 134 mmol/L — AB (ref 135–145)

## 2018-04-08 LAB — APTT: aPTT: 99 seconds — ABNORMAL HIGH (ref 24–36)

## 2018-04-08 LAB — HEPARIN LEVEL (UNFRACTIONATED): HEPARIN UNFRACTIONATED: 0.73 [IU]/mL — AB (ref 0.30–0.70)

## 2018-04-08 LAB — MAGNESIUM: Magnesium: 2.4 mg/dL (ref 1.7–2.4)

## 2018-04-08 SURGERY — RIGHT/LEFT HEART CATH AND CORONARY ANGIOGRAPHY
Anesthesia: LOCAL

## 2018-04-08 MED ORDER — ASPIRIN 81 MG PO CHEW
81.0000 mg | CHEWABLE_TABLET | ORAL | Status: AC
  Administered 2018-04-08: 81 mg via ORAL
  Filled 2018-04-08: qty 1

## 2018-04-08 MED ORDER — MIDAZOLAM HCL 2 MG/2ML IJ SOLN
INTRAMUSCULAR | Status: DC | PRN
  Administered 2018-04-08: 1 mg via INTRAVENOUS

## 2018-04-08 MED ORDER — SODIUM CHLORIDE 0.9 % IV SOLN
INTRAVENOUS | Status: DC
  Administered 2018-04-08: 05:00:00 via INTRAVENOUS

## 2018-04-08 MED ORDER — SODIUM CHLORIDE 0.9 % IV SOLN: 250.0000 mL | INTRAVENOUS | Status: DC | PRN

## 2018-04-08 MED ORDER — SODIUM CHLORIDE 0.9% FLUSH
3.0000 mL | Freq: Two times a day (BID) | INTRAVENOUS | Status: DC
  Administered 2018-04-08 – 2018-04-19 (×18): 3 mL via INTRAVENOUS

## 2018-04-08 MED ORDER — LIDOCAINE HCL (PF) 1 % IJ SOLN
INTRAMUSCULAR | Status: AC
  Filled 2018-04-08: qty 30

## 2018-04-08 MED ORDER — MIDAZOLAM HCL 2 MG/2ML IJ SOLN
INTRAMUSCULAR | Status: AC
  Filled 2018-04-08: qty 2

## 2018-04-08 MED ORDER — APIXABAN 5 MG PO TABS
5.0000 mg | ORAL_TABLET | Freq: Two times a day (BID) | ORAL | Status: DC
  Administered 2018-04-09 – 2018-04-19 (×21): 5 mg via ORAL
  Filled 2018-04-08 (×22): qty 1

## 2018-04-08 MED ORDER — HEPARIN (PORCINE) IN NACL 1000-0.9 UT/500ML-% IV SOLN
INTRAVENOUS | Status: DC | PRN
  Administered 2018-04-08 (×2): 500 mL

## 2018-04-08 MED ORDER — FENTANYL CITRATE (PF) 100 MCG/2ML IJ SOLN
INTRAMUSCULAR | Status: AC
  Filled 2018-04-08: qty 2

## 2018-04-08 MED ORDER — LIDOCAINE HCL (PF) 1 % IJ SOLN
INTRAMUSCULAR | Status: DC | PRN
  Administered 2018-04-08: 15 mL

## 2018-04-08 MED ORDER — RANOLAZINE ER 500 MG PO TB12
500.0000 mg | ORAL_TABLET | Freq: Two times a day (BID) | ORAL | Status: DC
  Administered 2018-04-08 – 2018-04-21 (×27): 500 mg via ORAL
  Filled 2018-04-08 (×28): qty 1

## 2018-04-08 MED ORDER — HEPARIN (PORCINE) IN NACL 1000-0.9 UT/500ML-% IV SOLN
INTRAVENOUS | Status: AC
  Filled 2018-04-08: qty 1000

## 2018-04-08 MED ORDER — ONDANSETRON HCL 4 MG/2ML IJ SOLN
4.0000 mg | Freq: Four times a day (QID) | INTRAMUSCULAR | Status: DC | PRN
  Administered 2018-04-08 – 2018-04-20 (×5): 4 mg via INTRAVENOUS
  Filled 2018-04-08 (×5): qty 2

## 2018-04-08 MED ORDER — SODIUM CHLORIDE 0.9% FLUSH: 3.0000 mL | INTRAVENOUS | Status: DC | PRN

## 2018-04-08 MED ORDER — CHLORHEXIDINE GLUCONATE CLOTH 2 % EX PADS: 6.0000 | MEDICATED_PAD | Freq: Every day | CUTANEOUS | Status: DC

## 2018-04-08 MED ORDER — IOHEXOL 350 MG/ML SOLN
INTRAVENOUS | Status: DC | PRN
  Administered 2018-04-08: 70 mL via INTRACARDIAC

## 2018-04-08 MED ORDER — MIDODRINE HCL 5 MG PO TABS
10.0000 mg | ORAL_TABLET | Freq: Three times a day (TID) | ORAL | Status: DC
  Administered 2018-04-08 – 2018-04-12 (×11): 10 mg via ORAL
  Filled 2018-04-08 (×11): qty 2

## 2018-04-08 MED ORDER — ACETAMINOPHEN 325 MG PO TABS
650.0000 mg | ORAL_TABLET | ORAL | Status: DC | PRN
  Administered 2018-04-13: 650 mg via ORAL
  Filled 2018-04-08: qty 2

## 2018-04-08 MED ORDER — FENTANYL CITRATE (PF) 100 MCG/2ML IJ SOLN
INTRAMUSCULAR | Status: DC | PRN
  Administered 2018-04-08: 25 ug via INTRAVENOUS

## 2018-04-08 MED ORDER — CHLORHEXIDINE GLUCONATE CLOTH 2 % EX PADS
6.0000 | MEDICATED_PAD | Freq: Every day | CUTANEOUS | Status: DC
  Administered 2018-04-09: 6 via TOPICAL

## 2018-04-08 MED ORDER — MEXILETINE HCL 150 MG PO CAPS
150.0000 mg | ORAL_CAPSULE | Freq: Two times a day (BID) | ORAL | Status: DC
  Administered 2018-04-08 – 2018-04-23 (×30): 150 mg via ORAL
  Filled 2018-04-08 (×35): qty 1

## 2018-04-08 MED ORDER — AMIODARONE HCL 200 MG PO TABS
400.0000 mg | ORAL_TABLET | Freq: Two times a day (BID) | ORAL | Status: DC
  Administered 2018-04-08 – 2018-04-09 (×3): 400 mg via ORAL
  Filled 2018-04-08 (×3): qty 2

## 2018-04-08 SURGICAL SUPPLY — 13 items
CATH INFINITI 5 FR 3DRC (CATHETERS) IMPLANT
CATH INFINITI 5FR MULTPACK ANG (CATHETERS) ×1 IMPLANT
CATH SWAN GANZ 7F STRAIGHT (CATHETERS) ×1 IMPLANT
GLIDESHEATH SLEND SS 6F .021 (SHEATH) IMPLANT
GUIDEWIRE INQWIRE 1.5J.035X260 (WIRE) IMPLANT
INQWIRE 1.5J .035X260CM (WIRE)
KIT HEART LEFT (KITS) ×2 IMPLANT
PACK CARDIAC CATHETERIZATION (CUSTOM PROCEDURE TRAY) ×2 IMPLANT
SHEATH GLIDE SLENDER 4/5FR (SHEATH) IMPLANT
SHEATH PINNACLE 5F 10CM (SHEATH) ×1 IMPLANT
SHEATH PINNACLE 7F 10CM (SHEATH) ×1 IMPLANT
TRANSDUCER W/STOPCOCK (MISCELLANEOUS) ×2 IMPLANT
WIRE EMERALD 3MM-J .035X150CM (WIRE) ×1 IMPLANT

## 2018-04-08 NOTE — Progress Notes (Signed)
Received report from [employer]. Assumed care of patient. Agree with previous assessment. Vital signs stable. No complaints at this time. Awaiting cath procedure. Will continue to monitor.

## 2018-04-08 NOTE — Interval H&P Note (Signed)
History and Physical Interval Note:  05/04/2018 1:03 PM  Eric Murillo  has presented today for surgery, with the diagnosis of vt  The various methods of treatment have been discussed with the patient and family. After consideration of risks, benefits and other options for treatment, the patient has consented to  Procedure(s): RIGHT/LEFT HEART CATH AND CORONARY ANGIOGRAPHY (N/A) as a surgical intervention .  The patient's history has been reviewed, patient examined, no change in status, stable for surgery.  I have reviewed the patient's chart and labs.  Questions were answered to the patient's satisfaction.     Danaisha Celli [employer]

## 2018-04-08 NOTE — Progress Notes (Addendum)
Advanced Heart Failure Rounding Note  PCP-Cardiologist: [name], MD   Subjective:    Presented to [facility] ED after a syncopal episode. 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 Transferred to [facility] and had another episode of VT in ED that required ATP.   He was started on a lidocaine drip with recurrent VT and transferred to [facility]. Amio and mexilitine held.  IV lidocaine stopped 04/07/18.  K 4.3. Mg 2.4. Cr 6.28 (chronic, ESRD)  Feeling good this am. Had good weekend. No further VT. Denies lightheadedness or dizziness. No SOB at this time.    Objective:   Weight Range: 123.3 kg Body mass index is 40.14 kg/m.   Vital Signs:   Temp:  [98.1 F (36.7 C)-98.8 F (37.1 C)] 98.4 F (36.9 C) (09/16 0756) Pulse Rate:  [83-96] 84 (09/16 0400) Resp:  [14-30] 26 (09/16 0500) BP: (83-146)/(54-87) 95/63 (09/16 0500) SpO2:  [94 %-98 %] 97 % (09/16 0400) Weight:  [123.3 kg] 123.3 kg (09/16 0500) Last BM Date: 04/07/2018  Weight change: Filed Weights   04/06/18 1845 04/07/18 0500 04/09/2018 0500  Weight: 123 kg 122.7 kg 123.3 kg    Intake/Output:   Intake/Output Summary (Last 24 hours) at 2018/04/09 0810 Last data filed at 04-09-2018 0400 Gross per 24 hour  Intake 715.23 ml  Output 425 ml  Net 290.23 ml      Physical Exam    General: NAD HEENT: Normal Neck: Supple. JVP ~7-8 cm. Carotids 2+ bilat; no bruits. No thyromegaly or nodule noted. Cor: PMI nondisplaced. RRR, No M/G/R noted Lungs: CTAB, normal effort. Abdomen: Soft, non-tender, non-distended, no HSM. No bruits or masses. +BS  Extremities: No cyanosis, clubbing, or rash. BLE trace to 1+ edema. RUE AV fistula.  Neuro: Alert & orientedx3, cranial nerves grossly intact. moves all 4 extremities w/o difficulty. Affect pleasant   Telemetry   Appears to be in NSR 80-90s, no further VT, personally reviewed.   EKG    No new tracings.    Labs    CBC Recent Labs    04/07/18 0417  04/09/18 0430  WBC 11.2* 10.0  HGB 10.9* 11.0*  HCT 35.9* 35.9*  MCV 96.5 95.7  PLT 196 199   Basic Metabolic Panel Recent Labs    05/69/79 0417 2018-04-09 0430 04/09/2018 0431  NA 135  --  134*  K 4.5  --  4.3  CL 98  --  95*  CO2 27  --  25  GLUCOSE 98  --  106*  BUN 29*  --  44*  CREATININE 4.35*  --  6.28*  CALCIUM 8.7*  --  9.4  MG 2.3 2.4  --   PHOS 3.3  --  4.5   Liver Function Tests Recent Labs    04/07/18 0417 09-Apr-2018 0431  ALBUMIN 3.3* 3.3*   No results for input(s): LIPASE, AMYLASE in the last 72 hours. Cardiac Enzymes No results for input(s): CKTOTAL, CKMB, CKMBINDEX, TROPONINI in the last 72 hours.  BNP: BNP (last 3 results) Recent Labs    07/26/17 0916 11/03/17 1238 11/13/17 0400  BNP 399.7* 1,160.1* 1,085.3*    ProBNP (last 3 results) No results for input(s): PROBNP in the last 8760 hours.   D-Dimer No results for input(s): DDIMER in the last 72 hours. Hemoglobin A1C No results for input(s): HGBA1C in the last 72 hours. Fasting Lipid Panel No results for input(s): CHOL, HDL, LDLCALC, TRIG, CHOLHDL, LDLDIRECT in the last 72 hours. Thyroid Function Tests No  results for input(s): TSH, T4TOTAL, T3FREE, THYROIDAB in the last 72 hours.  Invalid input(s): FREET3  Other results:   Imaging    No results found.   Medications:     Scheduled Medications: . allopurinol  100 mg Oral BID  . aspirin  81 mg Oral Pre-Cath  . calcitRIOL  0.5 mcg Oral Q T,Th,Sa-HD  . calcium acetate  1,334 mg Oral TID WC  . Chlorhexidine Gluconate Cloth  6 each Topical Q0600  . darbepoetin (ARANESP) injection - DIALYSIS  150 mcg Intravenous Q Sat-HD  . mexiletine  150 mg Oral Q8H  . multivitamin  1 tablet Oral QHS  . sodium chloride flush  3 mL Intravenous Q12H    Infusions: . sodium chloride 10 mL/hr at 04/22/2018 0435  . sodium chloride    . sodium chloride    . amiodarone 30 mg/hr (04/07/2018 0432)  . heparin 1,250 Units/hr (04/13/2018 0400)    PRN  Medications: sodium chloride, sodium chloride, acetaminophen **OR** [DISCONTINUED] acetaminophen, bismuth subsalicylate, HYDROcodone-acetaminophen, pentafluoroprop-tetrafluoroeth, promethazine, senna-docusate    Patient Profile   Eric Murillo is a 57 y.o. male with history of morbid obesity, hyperlipidemia, HTN, atrial flutter (s/p TEE DC-CV 04/03/14 and again in 8/17), NICM with chronic systolic HF and severe OSA but unable to tolerate CPAP.    Assessment/Plan   1. Recurrent VT requiring shock and ATP. - Follows with Dr Graciela Husbands. EP has been consulted. Last saw Dr Graciela Husbands 9/11 and was having recurrent episodes of VF. Mexilitine was added in addition to his amio. - Lidocaine stopped 04/07/18. - Remains on amio gtt. Adjustment per EP.  - Continue mexiletine 200 mg BID at home. Defer medications to EP.  - Plan to do [facility] today with Dr Shirlee Latch.   2. Chronic systolic heart failure: Nonischemic cardiomyopathy, likely related to HTN.  Echo in 8/14 with EF 45% but EF down to 25-30% on TEE while in atrial flutter (03/2014).  Echo (1/16) with EF ~25% and echo in 6/16 with EF 30-35%.  No cardiac MRI done with elevated creatinine and size. There was concern for cardiac amyloidosis.  However, negative SPEP and abdominal fat pad biopsy negative. Low voltage on ECG may be due to obesity and not amyloidosis. Echo 10/2017: EF 20-25%, mild to mod MR. S/P [employer] ICD - Volume managed by HD. Looks okay.  - Further per [facility] this afternoon.  - Continue carvedilol 3.125 mg BID on non-HD days.  - No spiro/dig/arb with CKD.  - No bidil with soft BPs - Not a candidate for VAD since he is ESRD. BMI 40. Not a transplant candidate.   3. Atypical Atrial Flutter: Recurrent - S/p several DCCVs, most recently 11/21/17. Has been seen by EP. Not felt to be amenable to ablation.  - Appears to remain in NSR.   - Eliquis on hold for cath. Continue heparin gtt.   4. ESRD - Now on HD T/R/Sat - Appreciate nephrology input.    5. HTN:   - SBP 90-100s.   6. OSA: - Has been unable to tolerate CPAP  7. Morbid Obesity:  - Body mass index is 40.14 kg/m.   Medication concerns reviewed with patient and pharmacy team. Barriers identified: Needs help getting amiodarone refill  Length of Stay: 4  Eric Freer, PA-C  04/19/2018, 8:10 AM  Advanced Heart Failure Team Pager [phone] (M-F; 7a - 4p)  Please contact [facility] for night-coverage after hours (4p -7a ) and weekends on [email or web address]  Patient seen with PA,  agree with the above note.    He appears to be in NSR with long 1st degree AVB.  No further VT.  Still with significant dyspnea.   RHC/LHC today:   Diagnostic  Dominance: Right  Left Main  No significant coronary disease.  Left Anterior Descending  No significant coronary disease.  Left Circumflex  No significant coronary disease.  Right Coronary Artery  No significant coronary disease.  Intervention   No interventions have been documented.  Right Heart   Right Heart Pressures RHC Procedural Findings: Hemodynamics (mmHg) RA mean 18 RV 65/17 PA 69/30, mean 47 PCWP mean 36 LV 100/27 AO 97/65  Oxygen saturations: PA 52% AO 94%  Cardiac Output (Fick) 4.98  Cardiac Index (Fick) 2.12 PVR 2.2 WU  Cardiac Output (Thermo) 4.85 Cardiac Index (Thermo) 2.06  PVR 2.3 WU   Patient remains markedly volume overloaded.  It may be that VT has been driven by ventricular stretch/dilation in setting of very high filling pressures.   - He needs HD today for volume removal, discussed with Dr. Arlean Hopping.  Would like him to get as aggressive fluid removal as BP will allow.   Appreciate EP assistance. He is now on ranolazine and lower dose mexiletine.  Amiodarone to po today.    [name] 03/25/2018 2:01 PM

## 2018-04-08 NOTE — Plan of Care (Signed)
  Problem: Clinical Measurements: Goal: Ability to maintain clinical measurements within normal limits will improve Outcome: Progressing Goal: Respiratory complications will improve Outcome: Progressing Goal: Cardiovascular complication will be avoided Outcome: Progressing   Problem: Activity: Goal: Risk for activity intolerance will decrease Outcome: Progressing   Problem: Nutrition: Goal: Adequate nutrition will be maintained Outcome: Progressing   Problem: Coping: Goal: Level of anxiety will decrease Outcome: Progressing   Problem: Pain Managment: Goal: General experience of comfort will improve Outcome: Progressing

## 2018-04-08 NOTE — Progress Notes (Signed)
Progress Note  Patient Name: Eric Murillo Date of Encounter: 03/27/2018  Primary Cardiologist: [name], MD  Electrophysiologist; Dr. Graciela Husbands  Subjective   GI upset, no CP, no rest SOB, sitting up.  Inpatient Medications    Scheduled Meds: . allopurinol  100 mg Oral BID  . calcitRIOL  0.5 mcg Oral Q T,Th,Sa-HD  . calcium acetate  1,334 mg Oral TID WC  . Chlorhexidine Gluconate Cloth  6 each Topical Q0600  . darbepoetin (ARANESP) injection - DIALYSIS  150 mcg Intravenous Q Sat-HD  . mexiletine  150 mg Oral Q8H  . multivitamin  1 tablet Oral QHS  . sodium chloride flush  3 mL Intravenous Q12H   Continuous Infusions: . sodium chloride Stopped (04/02/2018 0840)  . sodium chloride    . sodium chloride    . amiodarone 30 mg/hr (04/12/2018 0900)  . heparin 1,250 Units/hr (03/25/2018 0900)   PRN Meds: sodium chloride, sodium chloride, acetaminophen **OR** [DISCONTINUED] acetaminophen, bismuth subsalicylate, HYDROcodone-acetaminophen, pentafluoroprop-tetrafluoroeth, promethazine, senna-docusate   Vital Signs    Vitals:   04/05/2018 0756 03/26/2018 0800 04/09/2018 0845 04/14/2018 0900  BP:   92/61 (!) 101/46  Pulse:  89 88 88  Resp:  18 (!) 21 (!) 24  Temp: 98.4 F (36.9 C)     TempSrc: Oral     SpO2:  98% 98% 99%  Weight:      Height:        Intake/Output Summary (Last 24 hours) at 04/07/2018 0957 Last data filed at 04/21/2018 0900 Gross per 24 hour  Intake 901.61 ml  Output 425 ml  Net 476.61 ml   Filed Weights   04/06/18 1845 04/07/18 0500 03/31/2018 0500  Weight: 123 kg 122.7 kg 123.3 kg    Telemetry    SR, marked 1st degree AVblock - Personally Reviewed  ECG    No new EKGs - Personally Reviewed  Physical Exam   GEN: No acute distress.   Neck: No JVD Cardiac: RRR, 1/6 SM, norubs, + S3.  Respiratory: Clear to auscultation bilaterally. GI: Soft, nontender, non-distended  MS: No edema; No deformity. Neuro:  Nonfocal  Psych: Normal affect   Labs      Chemistry Recent Labs  Lab 04/06/18 0529 04/06/18 1952 04/07/18 0417 04/10/2018 0431  NA 136 134* 135 134*  K 4.1 3.2* 4.5 4.3  CL 99 94* 98 95*  CO2 26 25 27 25   GLUCOSE 111* 128* 98 106*  BUN 56* 23* 29* 44*  CREATININE 6.03* 3.48* 4.35* 6.28*  CALCIUM 8.8* 8.5* 8.7* 9.4  ALBUMIN 3.3*  --  3.3* 3.3*  GFRNONAA 9* 18* 14* 9*  GFRAA 11* 21* 16* 10*  ANIONGAP 11 15 10 14      Hematology Recent Labs  Lab 04/06/18 0529 04/07/18 0417 04/06/2018 0430  WBC 8.7 11.2* 10.0  RBC 3.67* 3.72* 3.75*  HGB 10.8* 10.9* 11.0*  HCT 35.4* 35.9* 35.9*  MCV 96.5 96.5 95.7  MCH 29.4 29.3 29.3  MCHC 30.5 30.4 30.6  RDW 14.1 14.2 14.2  PLT 199 196 199    Cardiac EnzymesNo results for input(s): TROPONINI in the last 168 hours.  Recent Labs  Lab 04/20/2018 2135  TROPIPOC 0.02     BNPNo results for input(s): BNP, PROBNP in the last 168 hours.   DDimer No results for input(s): DDIMER in the last 168 hours.   Radiology    No results found.  Cardiac Studies   11/21/17: TEE Study Conclusions - Left ventricle: The cavity size was mildly  dilated. Wall thickness was normal. Systolic function was severely reduced. The estimated ejection fraction was in the range of 25% to 30%. Diffuse hypokinesis. - Aortic valve: There was no stenosis. - Aorta: The ascending aorta was normal in caliber. - Mitral valve: There was mild to moderate regurgitation. - Left atrium: The atrium was moderately dilated. No evidence of thrombus in the atrial cavity or appendage. - Right ventricle: The cavity size was normal. Pacer wire or catheter noted in right ventricle. Systolic function was mildly reduced. - Right atrium: HD catheter noted in SVC. The atrium was mildly dilated. - Atrial septum: No ASD or PFO by color doppler. Impressions: - May proceed to DCCV.  09/02/14: Myoview (unable to open result)  Patient Profile     57 y.o. male with a hx of HTN, HLD, PAFlutter (he was seen by Dr.  Elberta Fortis to evaluate for ablation, felt not to be a candidate with 2 different arrhythmias, a severely dilated LA, mod dilated RA, and recommended ongoing amiodarone tx, NICM, chronic CHF (systolic), severe OSA (untreated, unable to tolerate CPAP), morbid obesity, ESRF on HD, transferred from [facility] to [facility] 04/02/2018 after a syncopal event.    He was unable to be dialyzed yesterday (unable to gain access) with plans to return the following day.  Later while at his girlfriend's house had a syncopal event >> [facility] via EMS  Device information: SJM single chamber ICD, implanted 05/21/15 AAD tx: amiodarone, appears initially for his Atrial arrhythmias + hx of appropriate therapies Interrogation: battery and lead measurements are OK VT episodes 4 failed APps, one shock successful ATP >> another morphology, accelerated as well, 4th accelerated futher to a 3rd morphology >> shock  admitted 04/04/13 lidocaine gtt, PO amiodarone 04/06/18 Sustained VT, failed ATP > shock 04/06/18 amiodarone gtt started, mexiletine started 04/07/18 lidocaine gtt stopped  Assessment & Plan    1. VT storm    Had seen Dr. Graciela Husbands a day or so prior to admission with noted VF tx on his device, mexiletine was added In d/w Dr. Shirlee Latch, given ESRF on HD, not a candidate for advanced therapies unfortunately.  I don't see any further VT since 04/06/18 Plan to change to PO amiodarone today GI intolerance, likely the mexiletine, d/w Dr. Graciela Husbands, will reduce dose, add ranexa Pending cath findings as well   2. 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 Chronic CHF (systolic) 4. ESRF on HD 5. HTN     Relative hypotension 6. Untreated OSA 7. Morbid obesity     For questions or updates, please contact [facility] Please consult [email or web address] for contact info under        Signed, Sheilah Pigeon, PA-C  03/26/2018, 9:57 AM

## 2018-04-08 NOTE — Progress Notes (Signed)
Pt came back on unit at 1450. Femoral site at level zero, no signs of bleeding or hematoma. No complaints of pain. Pt on bedrest till 1830. Vital signs stable. Will continue to monitor.

## 2018-04-08 NOTE — Progress Notes (Signed)
ANTICOAGULATION CONSULT NOTE   Pharmacy Consult for heparin Indication: atrial fibrillation  Allergies  Allergen Reactions  . Nsaids Other (See Comments)    Cannot take to due kidney issues  . Beta Adrenergic Blockers Other (See Comments)    An unnamed, white-colored Beta Blocker made him "feel funny" = made him feel "cagey"    Patient Measurements: Height: 5\' 9"  (175.3 cm) Weight: 271 lb 13.2 oz (123.3 kg) IBW/kg (Calculated) : 70.7  HEPARIN DW (KG): 99.2  Vital Signs: Temp: 98.4 F (36.9 C) (09/16 0756) Temp Source: Oral (09/16 0756) BP: 94/64 (09/16 1000) Pulse Rate: 88 (09/16 1000)  Labs: Recent Labs    04/06/18 0529 04/06/18 1049 04/06/18 1952 04/07/18 0417 04/20/2018 0430 04/09/2018 0431  HGB 10.8*  --   --  10.9* 11.0*  --   HCT 35.4*  --   --  35.9* 35.9*  --   PLT 199  --   --  196 199  --   APTT  --  74* 82* 96* 99*  --   HEPARINUNFRC  --  1.23*  --  0.85* 0.73*  --   CREATININE 6.03*  --  3.48* 4.35*  --  6.28*    Estimated Creatinine Clearance: 16.8 mL/min (A) (by C-G formula based on SCr of 6.28 mg/dL (H)).   Medical History: Past Medical History:  Diagnosis Date  . AICD (automatic cardioverter/defibrillator) present   . Anemia of chronic disease   . Arthritis   . Atrial flutter (HCC)    DCCV 9/15  . Chest pain on exertion   . Chronic systolic congestive heart failure, NYHA class 2 (HCC)    EF 20-25% 1/16  . CKD (chronic kidney disease) stage 3, GFR 30-59 ml/min (HCC)   . Dyslipidemia   . Gout    Of big toe  . Hyperglycemia   . Hyperlipidemia   . Hypertension   . Morbid obesity with BMI of 45.0-49.9, adult (HCC)   . Non-ischemic cardiomyopathy (HCC)   . Organic erectile dysfunction   . OSA (obstructive sleep apnea)   . Pilonidal cyst   . Submandibular sialolithiasis    Right    Assessment: Eric Murillo is a 57 year old male with hx of non-ischemic cardiomyopathy, aflutter, ESRD on HD. He presented to [facility] ED after a syncopal  episode/ICD shock for monomorphic VT. Transferred to [facility] ED after an episode of VT that required ATP. PTA apixaban regimen is 2.5mg  BID for recurrent atrial flutter, appropriate dose in ESRD given patient parameters is 5mg  BID. Apixaban on hold in anticipation for cath. Plan is for [facility] today 9/16.  Aptt at goal today on heparin 1250 units/hr.  Heparin level trending down with DOAC washout. Hemoglobin low but stable. No s/sx of bleeding or issues with infusion noted by nursing.    Goal of Therapy:  Heparin level 0.3-0.7 units/ml aPTT 66-102 seconds Monitor platelets by anticoagulation protocol: Yes   Plan:   Continue IV heparin at current rate of 1250 units/hr  Monitor daily heparin level, aPTT, s/sx of bleeding F/u plans to resume oral anticoagulation after cath today.  Thank you for involving pharmacy in this patient's care.   Marcelino Freestone, PharmD PGY2 Cardiology Pharmacy Resident Phone [phone] 04/12/2018 10:24 AM

## 2018-04-08 NOTE — Progress Notes (Signed)
[place] TEAM 1 - Stepdown/ICU TEAM  Catcher Hoerig  QAS:[id number] DOB: July 10, 1961 DOA: 04/14/2018 PCP: Synetta Shadow, MD    Brief Narrative:  57 y.o. male with a hx of nonischemic cardiomyopathy with EF of 20-25%, ESRD on dialysis, and defibrillator placement who presented with syncope. He got up to let his girlfriend in the door, and was walking back to the couch when he had an abrupt syncopal episode. He saw his cardiologist Dr. Graciela Husbands recently who started him mexiline.  His ICD was interrogated in the ED and revealed V. tach coincident with the timing of his syncope, and an appropriate defib shock.    After transfer to [facility] the pt suffered another episode of monomorphic VT, terminated by ATP.    Significant Events: 9/12 admit for VT - syncope   Subjective: Chart reviewed.  Medical hx noted.  EP and Nephrology teams cont to address all active issues.    Assessment & Plan:  VT w/ syncope  Chronic nonischemic systolic CHF   ESRD on HD T/Th/Sat  Morbid obesity - Body mass index is 40.14 kg/m.  HTN  HLD  DVT prophylaxis: eliquis Code Status: FULL CODE Family Communication:  Disposition Plan:   Consultants:  [facility]   Antimicrobials:  none  Objective: Blood pressure 92/61, pulse 88, temperature 98.4 F (36.9 C), temperature source Oral, resp. rate (!) 24, height 5\' 9"  (1.753 m), weight 123.3 kg, SpO2 99 %.  Intake/Output Summary (Last 24 hours) at 04/07/2018 0925 Last data filed at 04/22/2018 0900 Gross per 24 hour  Intake 901.61 ml  Output 425 ml  Net 476.61 ml   Filed Weights   04/06/18 1845 04/07/18 0500 04/09/2018 0500  Weight: 123 kg 122.7 kg 123.3 kg    Examination: No exam by TRH today   CBC: Recent Labs  Lab 04/06/18 0529 04/07/18 0417 04/01/2018 0430  WBC 8.7 11.2* 10.0  HGB 10.8* 10.9* 11.0*  HCT 35.4* 35.9* 35.9*  MCV 96.5 96.5 95.7  PLT 199 196 199   Basic Metabolic Panel: Recent Labs  Lab 04/06/18 0529 04/06/18 1952  04/07/18 0417 04/05/2018 0430 03/28/2018 0431  NA 136 134* 135  --  134*  K 4.1 3.2* 4.5  --  4.3  CL 99 94* 98  --  95*  CO2 26 25 27   --  25  GLUCOSE 111* 128* 98  --  106*  BUN 56* 23* 29*  --  44*  CREATININE 6.03* 3.48* 4.35*  --  6.28*  CALCIUM 8.8* 8.5* 8.7*  --  9.4  MG 2.7* 2.1 2.3 2.4  --   PHOS 3.9  --  3.3  --  4.5   GFR: Estimated Creatinine Clearance: 16.8 mL/min (A) (by C-G formula based on SCr of 6.28 mg/dL (H)).  Liver Function Tests: Recent Labs  Lab 04/06/18 0529 04/07/18 0417 04/03/2018 0431  ALBUMIN 3.3* 3.3* 3.3*    HbA1C: Hemoglobin A1C  Date/Time Value Ref Range Status  07/10/2013 04:11 PM 5.4  Final  01/20/2013 04:28 PM 5.6  Final    CBG: Recent Labs  Lab 04/05/18 0624  GLUCAP 111*    Recent Results (from the past 240 hour(s))  MRSA PCR Screening     Status: None   Collection Time: 04/05/18  1:01 AM  Result Value Ref Range Status   MRSA by PCR NEGATIVE NEGATIVE Final    Comment:        The GeneXpert MRSA Assay (FDA approved for NASAL specimens only), is one component  of a comprehensive MRSA colonization surveillance program. It is not intended to diagnose MRSA infection nor to guide or monitor treatment for MRSA infections. Performed at [facility], [address] [place], [place] [address]      Scheduled Meds: . allopurinol  100 mg Oral BID  . aspirin  81 mg Oral Pre-Cath  . calcitRIOL  0.5 mcg Oral Q T,Th,Sa-HD  . calcium acetate  1,334 mg Oral TID WC  . Chlorhexidine Gluconate Cloth  6 each Topical Q0600  . darbepoetin (ARANESP) injection - DIALYSIS  150 mcg Intravenous Q Sat-HD  . mexiletine  150 mg Oral Q8H  . multivitamin  1 tablet Oral QHS  . sodium chloride flush  3 mL Intravenous Q12H   Continuous Infusions: . sodium chloride Stopped (04/01/2018 0840)  . sodium chloride    . sodium chloride    . amiodarone 30 mg/hr (04/16/2018 0900)  . heparin 1,250 Units/hr (04/14/2018 0900)     LOS: 4 days   [name], MD [facility] Office  [phone] Pager - Text Page per Amion  If 7PM-7AM, please contact night-coverage per Amion 04/03/2018, 9:25 AM

## 2018-04-08 NOTE — Progress Notes (Addendum)
Site area: RFA/RFV Site Prior to Removal:  Level 0 Pressure Applied For:20 min Manual:   yes Patient Status During Pull:  stable Post Pull Site:  Level 0 Post Pull Instructions Given: yes  Post Pull Pulses Present: DP by doppler-right Dressing Applied:  clear Bedrest begins @ 1430 till 1830 Comments:Debbie Young/Amanda Suzie Portela

## 2018-04-08 NOTE — Progress Notes (Signed)
Subjective: Interval History: has no complaint . No syncope yest.   Objective: Vital signs in last 24 hours: Temp:  [98.1 F (36.7 C)-98.8 F (37.1 C)] 98.4 F (36.9 C) (09/16 0756) Pulse Rate:  [83-96] 88 (09/16 1000) Resp:  [14-28] 26 (09/16 1000) BP: (83-146)/(46-87) 94/64 (09/16 1000) SpO2:  [94 %-99 %] 96 % (09/16 1000) Weight:  [123.3 kg] 123.3 kg (09/16 0500) Weight change: -3.7 kg  Intake/Output from previous day: 09/15 0701 - 09/16 0700 In: 1068.4 [P.O.:120; I.V.:948.4] Out: 425 [Urine:425] Intake/Output this shift: Total I/O In: 215.2 [I.V.:215.2] Out: -   General appearance: alert, cooperative Resp: diminished breath sounds bilaterally Cardio: S1, S2 normal and systolic murmur: holosystolic 2/6, blowing at apex GI: obese, pos bs, soft Extremities: edema 1+ LE RUA AVF+bruit    CXR 9/14 mild pvc  HD: [facility]/ TTS 4.5h  122.5 kg  2/2 bath Hep none  RUA AVF - calc 0.5 ug tiw - venofer 7/10 completed 100 mg qhd   Assessment: 1 ESRD HD TTS. HD tomorrow 2 Anemia stable Hb 11 3 Massive obesity 4 HPTH meds 5 VT per cards 6 Vol ^ lower as tol 7 HTN bp's soft, off home coreg  P HD TTS, antiarrhythmics   Maree Krabbe 04/16/2018,11:03 AM  Lab Results: Recent Labs    04/07/18 0417 04/05/2018 0430  WBC 11.2* 10.0  HGB 10.9* 11.0*  HCT 35.9* 35.9*  PLT 196 199   BMET:  Recent Labs    04/07/18 0417 04/13/2018 0431  NA 135 134*  K 4.5 4.3  CL 98 95*  CO2 27 25  GLUCOSE 98 106*  BUN 29* 44*  CREATININE 4.35* 6.28*  CALCIUM 8.7* 9.4   No results for input(s): PTH in the last 72 hours. Iron Studies: No results for input(s): IRON, TIBC, TRANSFERRIN, FERRITIN in the last 72 hours.  Studies/Results: No results found.  I have reviewed the patient's current medications.

## 2018-04-09 LAB — RENAL FUNCTION PANEL
ANION GAP: 14 (ref 5–15)
Albumin: 3.2 g/dL — ABNORMAL LOW (ref 3.5–5.0)
BUN: 58 mg/dL — ABNORMAL HIGH (ref 6–20)
CALCIUM: 9 mg/dL (ref 8.9–10.3)
CO2: 23 mmol/L (ref 22–32)
CREATININE: 7.79 mg/dL — AB (ref 0.61–1.24)
Chloride: 97 mmol/L — ABNORMAL LOW (ref 98–111)
GFR calc Af Amer: 8 mL/min — ABNORMAL LOW (ref >60)
GFR, EST NON AFRICAN AMERICAN: 7 mL/min — AB (ref >60)
Glucose, Bld: 93 mg/dL (ref 70–99)
Phosphorus: 5.2 mg/dL — ABNORMAL HIGH (ref 2.5–4.6)
Potassium: 5.2 mmol/L — ABNORMAL HIGH (ref 3.5–5.1)
SODIUM: 134 mmol/L — AB (ref 135–145)

## 2018-04-09 LAB — CBC
HCT: 36.3 % — ABNORMAL LOW (ref 39.0–52.0)
HEMOGLOBIN: 10.8 g/dL — AB (ref 13.0–17.0)
MCH: 28.7 pg (ref 26.0–34.0)
MCHC: 29.8 g/dL — ABNORMAL LOW (ref 30.0–36.0)
MCV: 96.5 fL (ref 78.0–100.0)
PLATELETS: 214 10*3/uL (ref 150–400)
RBC: 3.76 MIL/uL — AB (ref 4.22–5.81)
RDW: 14.3 % (ref 11.5–15.5)
WBC: 12 10*3/uL — AB (ref 4.0–10.5)

## 2018-04-09 LAB — MAGNESIUM: Magnesium: 2.4 mg/dL (ref 1.7–2.4)

## 2018-04-09 MED ORDER — AMIODARONE HCL IN DEXTROSE 360-4.14 MG/200ML-% IV SOLN
60.0000 mg/h | INTRAVENOUS | Status: DC
  Administered 2018-04-09 (×2): 60 mg/h via INTRAVENOUS
  Filled 2018-04-09: qty 200

## 2018-04-09 MED ORDER — AMIODARONE HCL IN DEXTROSE 360-4.14 MG/200ML-% IV SOLN
30.0000 mg/h | INTRAVENOUS | Status: DC
  Administered 2018-04-09 – 2018-04-11 (×6): 30 mg/h via INTRAVENOUS
  Filled 2018-04-09 (×6): qty 200

## 2018-04-09 MED ORDER — AMIODARONE LOAD VIA INFUSION
150.0000 mg | Freq: Once | INTRAVENOUS | Status: DC
  Filled 2018-04-09: qty 83.34

## 2018-04-09 MED FILL — Medication: Qty: 1 | Status: CN

## 2018-04-09 MED FILL — Medication: Qty: 1 | Status: AC

## 2018-04-09 NOTE — Progress Notes (Addendum)
Advanced Heart Failure Rounding Note  PCP-Cardiologist: [name], MD   Subjective:    Presented to [facility] ED after a syncopal episode. 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 Transferred to [facility] and had another episode of VT in ED that required ATP.   Very fatigued, BP low on HD despite midodrine.   On po amio + mexilitene. Had NSVT this morning then had VT arrest with brief CPR, spontaneously converted.    RHC/LHC 2018-04-30  1. Low, but not markedly low, cardiac output.  2. Pulmonary venous hypertension.  3. Markedly elevated right and left heart filling pressures.   4. No significant coronary disease.   RHC Procedural Findings: Hemodynamics (mmHg) RA mean 18 RV 65/17 PA 69/30, mean 47 PCWP mean 36 LV 100/27 AO 97/65 Oxygen saturations: PA 52% AO 94% Cardiac Output (Fick) 4.98  Cardiac Index (Fick) 2.12 PVR 2.2 WU Cardiac Output (Thermo) 4.85 Cardiac Index (Thermo) 2.06  PVR 2.3 WU   Objective:   Weight Range: 124.2 kg Body mass index is 40.43 kg/m.   Vital Signs:   Temp:  [97.5 F (36.4 C)-99.5 F (37.5 C)] 98.1 F (36.7 C) (09/17 0800) Pulse Rate:  [87-118] 90 (09/17 0800) Resp:  [13-32] 25 (09/17 0800) BP: (92-136)/(46-96) 103/71 (09/17 0800) SpO2:  [85 %-100 %] 91 % (09/17 0800) Weight:  [124.2 kg] 124.2 kg (09/17 0715) Last BM Date: 04/07/18  Weight change: Filed Weights   04-30-2018 0500 04/09/18 0534 04/09/18 0715  Weight: 123.3 kg 124.2 kg 124.2 kg    Intake/Output:   Intake/Output Summary (Last 24 hours) at 04/09/2018 0842 Last data filed at 04/30/2018 1600 Gross per 24 hour  Intake 116.01 ml  Output 125 ml  Net -8.99 ml      Physical Exam   On dialysis.  General:  Appears fatigued. No resp difficulty HEENT: normal Neck: supple. JVP to jaw.  Carotids 2+ bilat; no bruits. No lymphadenopathy or thryomegaly appreciated. Cor: PMI nondisplaced. Regular rate & rhythm. No rubs, gallops or murmurs. Lungs:  clear Abdomen: soft, nontender, nondistended. No hepatosplenomegaly. No bruits or masses. Good bowel sounds. Extremities: no cyanosis, clubbing, rash, edema. RUE AVF  Neuro: alert & orientedx3, cranial nerves grossly intact. moves all 4 extremities w/o difficulty. Affect flat    Telemetry  SR with  NSVT this morning.   EKG    No new tracings.    Labs    CBC Recent Labs    04-30-2018 0430 04/09/18 0307  WBC 10.0 12.0*  HGB 11.0* 10.8*  HCT 35.9* 36.3*  MCV 95.7 96.5  PLT 199 214   Basic Metabolic Panel Recent Labs    16/10/96 0430 30-Apr-2018 0431 04/09/18 0307 04/09/18 0311  NA  --  134*  --  134*  K  --  4.3  --  5.2*  CL  --  95*  --  97*  CO2  --  25  --  23  GLUCOSE  --  106*  --  93  BUN  --  44*  --  58*  CREATININE  --  6.28*  --  7.79*  CALCIUM  --  9.4  --  9.0  MG 2.4  --  2.4  --   PHOS  --  4.5  --  5.2*   Liver Function Tests Recent Labs    2018-04-30 0431 04/09/18 0311  ALBUMIN 3.3* 3.2*   No results for input(s): LIPASE, AMYLASE in the last 72 hours. Cardiac Enzymes No results  for input(s): CKTOTAL, CKMB, CKMBINDEX, TROPONINI in the last 72 hours.  BNP: BNP (last 3 results) Recent Labs    07/26/17 0916 11/03/17 1238 11/13/17 0400  BNP 399.7* 1,160.1* 1,085.3*    ProBNP (last 3 results) No results for input(s): PROBNP in the last 8760 hours.   D-Dimer No results for input(s): DDIMER in the last 72 hours. Hemoglobin A1C No results for input(s): HGBA1C in the last 72 hours. Fasting Lipid Panel No results for input(s): CHOL, HDL, LDLCALC, TRIG, CHOLHDL, LDLDIRECT in the last 72 hours. Thyroid Function Tests No results for input(s): TSH, T4TOTAL, T3FREE, THYROIDAB in the last 72 hours.  Invalid input(s): FREET3  Other results:   Imaging    No results found.   Medications:     Scheduled Medications: . allopurinol  100 mg Oral BID  . amiodarone  400 mg Oral BID  . apixaban  5 mg Oral BID  . calcitRIOL  0.5 mcg Oral Q  T,Th,Sa-HD  . calcium acetate  1,334 mg Oral TID WC  . Chlorhexidine Gluconate Cloth  6 each Topical Q0600  . Chlorhexidine Gluconate Cloth  6 each Topical Q0600  . darbepoetin (ARANESP) injection - DIALYSIS  150 mcg Intravenous Q Sat-HD  . mexiletine  150 mg Oral Q12H  . midodrine  10 mg Oral TID WC  . multivitamin  1 tablet Oral QHS  . ranolazine  500 mg Oral BID  . sodium chloride flush  3 mL Intravenous Q12H  . sodium chloride flush  3 mL Intravenous Q12H    Infusions: . sodium chloride      PRN Medications: sodium chloride, acetaminophen, bismuth subsalicylate, HYDROcodone-acetaminophen, ondansetron (ZOFRAN) IV, promethazine, senna-docusate, sodium chloride flush    Patient Profile   Eric Murillo is a 57 y.o. male with history of morbid obesity, hyperlipidemia, HTN, atrial flutter (s/p TEE DC-CV 04/03/14 and again in 8/17), NICM with chronic systolic HF and severe OSA but unable to tolerate CPAP.    Assessment/Plan   1. Recurrent VT requiring shock and ATP. - Follows with Dr Graciela Husbands. EP has been consulted. Last saw Dr Graciela Husbands 9/11 and was having recurrent episodes of VF. Mexilitine was added in addition to his amio. - Lidocaine stopped 04/07/18. - Had NSVT this morning.  - Off amio drip. Continue amio 400 mg twice a day.  - Continue mexiletine 200 mg BID at home.  Defer medications to EP.   2. Chronic systolic heart failure: Nonischemic cardiomyopathy, likely related to HTN.  Echo in 8/14 with EF 45% but EF down to 25-30% on TEE while in atrial flutter (03/2014).  Echo (1/16) with EF ~25% and echo in 6/16 with EF 30-35%.  No cardiac MRI done with elevated creatinine and size. There was concern for cardiac amyloidosis.  However, negative SPEP and abdominal fat pad biopsy negative. Low voltage on ECG may be due to obesity and not amyloidosis. Echo 10/2017: EF 20-25%, mild to mod MR. S/P [employer] Jude ICD -Marked volume overload on cath - Continue carvedilol 3.125 mg BID on non-HD days.    - No spiro/dig/arb with CKD.  - No bidil with soft BPs - Not a candidate for VAD since he is ESRD. BMI 40. Not a transplant candidate.   3. Atypical Atrial Flutter: Recurrent - S/p several DCCVs, most recently 11/21/17. Has been seen by EP. Not felt to be amenable to ablation.  - on eliqus 5 mg twice a day. .   4. ESRD - Now on HD T/R/Sat - on HD  now.  - Appreciate nephrology input.   5. HTN:   - Stable.   6. OSA: - Has been unable to tolerate CPAP  7. Morbid Obesity:  - Body mass index is 40.43 kg/m.   Medication concerns reviewed with patient and pharmacy team. Barriers identified: Needs help getting amiodarone refill  Length of Stay: 5  Amy Clegg, NP  04/09/2018, 8:42 AM  Advanced Heart Failure Team Pager [phone] (M-F; 7a - 4p)  Please contact [facility] for night-coverage after hours (4p -7a ) and weekends on [email or web address]  Patient seen with NP, agree with the above note.  Patient not tolerating HD well despite midodrine use, BP running low.  Just after I saw him on HD, he had VT arrest with loss of pulses.  He spontaneously converted back to NSR after about 1 minute of CPR and was talking/fully conscious.  SBP in 120s when checked post-code. On exam, JVP 12+ cm, 1+ ankle edema, clear lungs, regular S1S2. - Resume amiodarone gtt.  - Hold HD for now, try to resume later this afternoon.   Patient was noted to be markedly volume overloaded yesterday on RHC.  This could be cause of anorexia and is likely the cause of dyspnea.  Marked volume overload/ventricular stretch may also be triggering VT.  Unfortunately, BP is not tolerating aggressive ultrafiltration even with midodrine use.    We do not have a lot of good options here with recurrent VT despite amiodarone and mexiletine and difficulty with diuresis given low BP.  I am going to involve palliative care for goals of care discussion.   CRITICAL CARE Performed by: [name]  Total critical care time: 35  minutes  Critical care time was exclusive of separately billable procedures and treating other patients.  Critical care was necessary to treat or prevent imminent or life-threatening deterioration.  Critical care was time spent personally by me on the following activities: development of treatment plan with patient and/or surrogate as well as nursing, discussions with consultants, evaluation of patient's response to treatment, examination of patient, obtaining history from patient or surrogate, ordering and performing treatments and interventions, ordering and review of laboratory studies, ordering and review of radiographic studies, pulse oximetry and re-evaluation of patient's condition.  [name] 04/09/2018 11:00 AM

## 2018-04-09 NOTE — Progress Notes (Signed)
Subjective: Interval History: has no complaint . No syncope yest.   Objective: Vital signs in last 24 hours: Temp:  [97.5 F (36.4 C)-99.5 F (37.5 C)] (P) 97.8 F (36.6 C) (09/17 1050) Pulse Rate:  [84-144] 87 (09/17 1030) Resp:  [13-32] 31 (09/17 1030) BP: (85-136)/(58-96) 85/67 (09/17 1030) SpO2:  [85 %-99 %] 94 % (09/17 1030) Weight:  [124.2 kg] 124.2 kg (09/17 0715) Weight change: 0.9 kg  Intake/Output from previous day: 09/16 0701 - 09/17 0700 In: 266.5 [I.V.:266.5] Out: 125 [Urine:125] Intake/Output this shift: Total I/O In: 120 [P.O.:120] Out: -   General appearance: alert, cooperative Resp: diminished breath sounds bilaterally Cardio: S1, S2 normal and systolic murmur: holosystolic 2/6, blowing at apex GI: obese, pos bs, soft Extremities: edema 1+ LE RUA AVF+bruit    CXR 9/14 mild pvc  HD: [facility]/ TTS 4.5h  122.5 kg  2/2 bath Hep none  RUA AVF - calc 0.5 ug tiw - venofer 7/10 completed 100 mg qhd   Assessment: 1 ESRD HD TTS. HD today w/ midodrine/ albumin support BP's tried to pull fluid off given high filling pressures on R heart cath, however BP's were still low and he made it through half of HD then had VT arrest so HD dc'd.  Per cardiology there are not many other options and will get pall care involved.  Will follow.  2 Anemia stable Hb 11 3 Obesity 4 HPTH meds 5 VT storm - EP following 6 Vol ^ lower as tol 7 HTN bp's soft, off home coreg  P as above   Maree Krabbe Apr 18, 2018,11:03 AM  Lab Results: Recent Labs    04-18-18 0430 04/09/18 0307  WBC 10.0 12.0*  HGB 11.0* 10.8*  HCT 35.9* 36.3*  PLT 199 214   BMET:  Recent Labs    04-18-18 0431 04/09/18 0311  NA 134* 134*  K 4.3 5.2*  CL 95* 97*  CO2 25 23  GLUCOSE 106* 93  BUN 44* 58*  CREATININE 6.28* 7.79*  CALCIUM 9.4 9.0   No results for input(s): PTH in the last 72 hours. Iron Studies: No results for input(s): IRON, TIBC, TRANSFERRIN, FERRITIN in the last 72  hours.  Studies/Results: No results found.  I have reviewed the patient's current medications.

## 2018-04-09 NOTE — Progress Notes (Signed)
Pt went into NSVT at approximately 0845 this shift. Strip showed 12 beats of VT and ICD firing. Paged MD Mclean and asked to speak to NP. Spoke to NP Amy regarding the strip. No new orders was received. See flowsheet.   At 1045, pt again went into VT, was alert at first but suddenly became unresponsive. Code blue was initiated. Compressions started at 1047 and stopped at 1048. Dialysis RN gave a bolus, see note. Pt spontaneously converted. No meds were given during code. MD Mclean was at bedside (see note), as well as the code blue team.  MD Mclean ordered to start IV amioderone, which was started at 1056.   Pt is currently alert and oriented, sitting up in bed. No complaints at this time. Does not appear to be in distress. Vital signs currently stable. Will continue to monitor.

## 2018-04-09 NOTE — Plan of Care (Signed)
Pt currently dangled at edge of bed. Family at bedside. Vital signs stable. No complaints at this time. Pt had dialysis this shift, 900 cc out per dialysis nurse. Pt went into VT this shift and a code was initiated, see previous note. No new changes at this time. Will continue to monitor.   Problem: Health Behavior/Discharge Planning: Goal: Ability to manage health-related needs will improve Outcome: Progressing   Problem: Clinical Measurements: Goal: Ability to maintain clinical measurements within normal limits will improve Outcome: Progressing Goal: Will remain free from infection Outcome: Progressing Goal: Diagnostic test results will improve Outcome: Progressing Goal: Respiratory complications will improve Outcome: Progressing Goal: Cardiovascular complication will be avoided Outcome: Progressing   Problem: Activity: Goal: Risk for activity intolerance will decrease Outcome: Progressing   Problem: Nutrition: Goal: Adequate nutrition will be maintained Outcome: Progressing   Problem: Coping: Goal: Level of anxiety will decrease Outcome: Progressing   Problem: Elimination: Goal: Will not experience complications related to bowel motility Outcome: Progressing Goal: Will not experience complications related to urinary retention Outcome: Progressing   Problem: Pain Managment: Goal: General experience of comfort will improve Outcome: Progressing   Problem: Safety: Goal: Ability to remain free from injury will improve Outcome: Progressing   Problem: Skin Integrity: Goal: Risk for impaired skin integrity will decrease Outcome: Progressing

## 2018-04-09 NOTE — Progress Notes (Addendum)
Approximated 1045 pt went to VT arrest and became unresponsive. Code blue called, code team arrives and CPR initated with attending MD at bedside. Bolus with normal saline approx. 700cc. And pt spontaneously converted.. HD treatment terminated as ordered by attending MD.Pt currently alert/oriented in no apparent distress. No complaints.

## 2018-04-09 NOTE — Procedures (Addendum)
Seen on HD this am, BP's soft , needs fluid off per cards/ RHC.   I was present at this dialysis session, have reviewed the session itself and made  appropriate changes Vinson Moselle MD [facility] pager [phone]   04/09/2018, 11:31 AM

## 2018-04-09 NOTE — Progress Notes (Signed)
Progress Note  Patient Name: Eric Murillo Date of Encounter: 04/09/2018  Primary Cardiologist: [name], MD  Electrophysiologist; Dr. Graciela Husbands  Subjective  Some sob this am and anorectic   Inpatient Medications    Scheduled Meds: . allopurinol  100 mg Oral BID  . amiodarone  400 mg Oral BID  . apixaban  5 mg Oral BID  . calcitRIOL  0.5 mcg Oral Q T,Th,Sa-HD  . calcium acetate  1,334 mg Oral TID WC  . Chlorhexidine Gluconate Cloth  6 each Topical Q0600  . Chlorhexidine Gluconate Cloth  6 each Topical Q0600  . darbepoetin (ARANESP) injection - DIALYSIS  150 mcg Intravenous Q Sat-HD  . mexiletine  150 mg Oral Q12H  . midodrine  10 mg Oral TID WC  . multivitamin  1 tablet Oral QHS  . ranolazine  500 mg Oral BID  . sodium chloride flush  3 mL Intravenous Q12H  . sodium chloride flush  3 mL Intravenous Q12H   Continuous Infusions: . sodium chloride     PRN Meds: sodium chloride, acetaminophen, bismuth subsalicylate, HYDROcodone-acetaminophen, ondansetron (ZOFRAN) IV, promethazine, senna-docusate, sodium chloride flush   Vital Signs    Vitals:   04/09/18 0347 04/09/18 0400 04/09/18 0534 04/09/18 0540  BP:  125/90  (!) 132/96  Pulse:  90  92  Resp:  (!) 29  (!) 23  Temp: 98.7 F (37.1 C)     TempSrc: Oral     SpO2:  93%  98%  Weight:   124.2 kg   Height:        Intake/Output Summary (Last 24 hours) at 04/09/2018 0729 Last data filed at 04-10-18 1600 Gross per 24 hour  Intake 266.51 ml  Output 125 ml  Net 141.51 ml   Filed Weights   04/07/18 0500 04-10-2018 0500 04/09/18 0534  Weight: 122.7 kg 123.3 kg 124.2 kg    Telemetry    Sinus with out VT Personally Reviewed  ECG    No new EKGs - Personally Reviewed  Physical Exam   Well developed and nourished in no acute distress HENT normal Neck supple with JVP-flat Clear Regular rate and rhythm, no murmurs or gallops Abd-soft with active BS No Clubbing cyanosis edema Skin-warm and dry A & Oriented   Grossly normal sensory and motor function    Labs    Chemistry Recent Labs  Lab 04/07/18 0417 04-10-18 0431 04/09/18 0311  NA 135 134* 134*  K 4.5 4.3 5.2*  CL 98 95* 97*  CO2 27 25 23   GLUCOSE 98 106* 93  BUN 29* 44* 58*  CREATININE 4.35* 6.28* 7.79*  CALCIUM 8.7* 9.4 9.0  ALBUMIN 3.3* 3.3* 3.2*  GFRNONAA 14* 9* 7*  GFRAA 16* 10* 8*  ANIONGAP 10 14 14      Hematology Recent Labs  Lab 04/07/18 0417 04/10/2018 0430 04/09/18 0307  WBC 11.2* 10.0 12.0*  RBC 3.72* 3.75* 3.76*  HGB 10.9* 11.0* 10.8*  HCT 35.9* 35.9* 36.3*  MCV 96.5 95.7 96.5  MCH 29.3 29.3 28.7  MCHC 30.4 30.6 29.8*  RDW 14.2 14.2 14.3  PLT 196 199 214    Cardiac EnzymesNo results for input(s): TROPONINI in the last 168 hours.  Recent Labs  Lab 04/18/2018 2135  TROPIPOC 0.02     BNPNo results for input(s): BNP, PROBNP in the last 168 hours.   DDimer No results for input(s): DDIMER in the last 168 hours.   Radiology    No results found.  Cardiac Studies   11/21/17:  TEE Study Conclusions - Left ventricle: The cavity size was mildly dilated. Wall thickness was normal. Systolic function was severely reduced. The estimated ejection fraction was in the range of 25% to 30%. Diffuse hypokinesis. - Aortic valve: There was no stenosis. - Aorta: The ascending aorta was normal in caliber. - Mitral valve: There was mild to moderate regurgitation. - Left atrium: The atrium was moderately dilated. No evidence of thrombus in the atrial cavity or appendage. - Right ventricle: The cavity size was normal. Pacer wire or catheter noted in right ventricle. Systolic function was mildly reduced. - Right atrium: HD catheter noted in SVC. The atrium was mildly dilated. - Atrial septum: No ASD or PFO by color doppler. Impressions: - May proceed to DCCV.  09/02/14: Myoview (unable to open result)  Patient Profile     57 y.o. male with a hx of HTN, HLD, PAFlutter (he was seen by Dr. Elberta Fortis to  evaluate for ablation, felt not to be a candidate with 2 different arrhythmias, a severely dilated LA, mod dilated RA, and recommended ongoing amiodarone tx, NICM, chronic CHF (systolic), severe OSA (untreated, unable to tolerate CPAP), morbid obesity, ESRF on HD, [facility] to [facility] 04/07/2018 after a syncopal event.    He was unable to be dialyzed yesterday (unable to gain access) with plans to return the following day.  Later while at his girlfriend's house had a syncopal event >> [facility] via EMS  Device information: SJM single chamber ICD, implanted 05/21/15 AAD tx: amiodarone, appears initially for his Atrial arrhythmias + hx of appropriate therapies Interrogation: battery and lead measurements are OK VT episodes 4 failed APps, one shock successful ATP >> another morphology, accelerated as well, 4th accelerated futher to a 3rd morphology >> shock  admitted 04/04/13 lidocaine gtt, PO amiodarone 04/06/18 Sustained VT, failed ATP > shock 04/06/18 amiodarone gtt started, mexiletine started 04/07/18 lidocaine gtt stopped  Assessment & Plan    1. VT storm  2. 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 Chronic CHF (systolic) 4. ESRF on HD 5. HTN     Relative hypotension 6. Untreated OSA 7. Morbid obesity Nausea      No overnight VT Continue mex ranolazine and amio  Not sure the cause of the nausea, likely mex but will see how he does today  OOB chair following HD  Have broached again discussions of end of life management   Will pursue later, thiink hospice to help clarify expectations would be helpful, but have not yet raised that specifically     Signed, Sherryl Manges, MD  04/09/2018, 7:29 AM

## 2018-04-09 NOTE — Progress Notes (Signed)
Palliative Medicine consult noted. Due to high referral volume, there may be a delay seeing this patient. Please call the Palliative Medicine Team office at [phone] if recommendations are needed in the interim.  Thank you for inviting [place] to see this patient.  Margret Chance Riyaan Heroux, RN, BSN, [facility] Palliative Medicine Team 04/09/2018 1:51 PM Office [phone]

## 2018-04-10 ENCOUNTER — Encounter (HOSPITAL_COMMUNITY): Payer: Self-pay | Admitting: Primary Care

## 2018-04-10 ENCOUNTER — Inpatient Hospital Stay (HOSPITAL_COMMUNITY): Payer: Medicaid Other

## 2018-04-10 LAB — RENAL FUNCTION PANEL
ALBUMIN: 3.3 g/dL — AB (ref 3.5–5.0)
Albumin: 3.2 g/dL — ABNORMAL LOW (ref 3.5–5.0)
Anion gap: 16 — ABNORMAL HIGH (ref 5–15)
Anion gap: 15 (ref 5–15)
BUN: 52 mg/dL — AB (ref 6–20)
BUN: 51 mg/dL — ABNORMAL HIGH (ref 6–20)
CALCIUM: 9 mg/dL (ref 8.9–10.3)
CO2: 24 mmol/L (ref 22–32)
CO2: 24 mmol/L (ref 22–32)
CREATININE: 7.27 mg/dL — AB (ref 0.61–1.24)
Calcium: 9.2 mg/dL (ref 8.9–10.3)
Chloride: 93 mmol/L — ABNORMAL LOW (ref 98–111)
Chloride: 95 mmol/L — ABNORMAL LOW (ref 98–111)
Creatinine, Ser: 8.07 mg/dL — ABNORMAL HIGH (ref 0.61–1.24)
GFR calc Af Amer: 8 mL/min — ABNORMAL LOW (ref >60)
GFR calc Af Amer: 9 mL/min — ABNORMAL LOW (ref >60)
GFR calc non Af Amer: 7 mL/min — ABNORMAL LOW (ref >60)
GFR calc non Af Amer: 7 mL/min — ABNORMAL LOW (ref >60)
GLUCOSE: 100 mg/dL — AB (ref 70–99)
Glucose, Bld: 100 mg/dL — ABNORMAL HIGH (ref 70–99)
PHOSPHORUS: 6.2 mg/dL — AB (ref 2.5–4.6)
Phosphorus: 6.1 mg/dL — ABNORMAL HIGH (ref 2.5–4.6)
Potassium: 5.6 mmol/L — ABNORMAL HIGH (ref 3.5–5.1)
Potassium: 6 mmol/L — ABNORMAL HIGH (ref 3.5–5.1)
SODIUM: 133 mmol/L — AB (ref 135–145)
SODIUM: 134 mmol/L — AB (ref 135–145)

## 2018-04-10 LAB — BASIC METABOLIC PANEL
ANION GAP: 24 — AB (ref 5–15)
BUN: 51 mg/dL — ABNORMAL HIGH (ref 6–20)
CALCIUM: 9.4 mg/dL (ref 8.9–10.3)
CO2: 19 mmol/L — ABNORMAL LOW (ref 22–32)
Chloride: 93 mmol/L — ABNORMAL LOW (ref 98–111)
Creatinine, Ser: 7.47 mg/dL — ABNORMAL HIGH (ref 0.61–1.24)
GFR calc Af Amer: 8 mL/min — ABNORMAL LOW (ref >60)
GFR calc non Af Amer: 7 mL/min — ABNORMAL LOW (ref >60)
GLUCOSE: 80 mg/dL (ref 70–99)
Potassium: 6.2 mmol/L — ABNORMAL HIGH (ref 3.5–5.1)
Sodium: 136 mmol/L (ref 135–145)

## 2018-04-10 LAB — CBC WITH DIFFERENTIAL/PLATELET
Abs Immature Granulocytes: 0.1 10*3/uL (ref 0.0–0.1)
BASOS ABS: 0 10*3/uL (ref 0.0–0.1)
Basophils Relative: 0 %
EOS ABS: 0.1 10*3/uL (ref 0.0–0.7)
EOS PCT: 0 %
HCT: 36.2 % — ABNORMAL LOW (ref 39.0–52.0)
Hemoglobin: 11 g/dL — ABNORMAL LOW (ref 13.0–17.0)
Immature Granulocytes: 1 %
Lymphocytes Relative: 13 %
Lymphs Abs: 1.7 10*3/uL (ref 0.7–4.0)
MCH: 29.4 pg (ref 26.0–34.0)
MCHC: 30.4 g/dL (ref 30.0–36.0)
MCV: 96.8 fL (ref 78.0–100.0)
MONO ABS: 2 10*3/uL — AB (ref 0.1–1.0)
Monocytes Relative: 16 %
Neutro Abs: 9 10*3/uL — ABNORMAL HIGH (ref 1.7–7.7)
Neutrophils Relative %: 70 %
PLATELETS: 233 10*3/uL (ref 150–400)
RBC: 3.74 MIL/uL — ABNORMAL LOW (ref 4.22–5.81)
RDW: 14.4 % (ref 11.5–15.5)
WBC: 12.9 10*3/uL — ABNORMAL HIGH (ref 4.0–10.5)

## 2018-04-10 LAB — MAGNESIUM: Magnesium: 2.6 mg/dL — ABNORMAL HIGH (ref 1.7–2.4)

## 2018-04-10 MED ORDER — PRISMASOL BGK 4/2.5 32-4-2.5 MEQ/L IV SOLN
INTRAVENOUS | Status: DC
  Administered 2018-04-11 (×5): via INTRAVENOUS_CENTRAL
  Filled 2018-04-10 (×10): qty 5000

## 2018-04-10 MED ORDER — SODIUM CHLORIDE 0.9 % FOR CRRT
INTRAVENOUS_CENTRAL | Status: DC | PRN
  Filled 2018-04-10: qty 1000

## 2018-04-10 MED ORDER — SODIUM CHLORIDE 0.9% FLUSH
10.0000 mL | Freq: Two times a day (BID) | INTRAVENOUS | Status: DC
  Administered 2018-04-10: 10 mL
  Administered 2018-04-10: 30 mL
  Administered 2018-04-11: 10 mL
  Administered 2018-04-13: 30 mL
  Administered 2018-04-14 (×2): 10 mL
  Administered 2018-04-15: 20 mL
  Administered 2018-04-16 – 2018-04-17 (×3): 10 mL
  Administered 2018-04-18: 20 mL
  Administered 2018-04-18 – 2018-04-21 (×6): 10 mL

## 2018-04-10 MED ORDER — PRISMASOL BGK 4/2.5 32-4-2.5 MEQ/L IV SOLN
INTRAVENOUS | Status: DC
  Administered 2018-04-10: 15:00:00 via INTRAVENOUS_CENTRAL
  Filled 2018-04-10 (×2): qty 5000

## 2018-04-10 MED ORDER — PRISMASOL BGK 4/2.5 32-4-2.5 MEQ/L IV SOLN
INTRAVENOUS | Status: DC
  Administered 2018-04-10 (×4): via INTRAVENOUS_CENTRAL
  Filled 2018-04-10 (×10): qty 5000

## 2018-04-10 MED ORDER — NOREPINEPHRINE 4 MG/250ML-% IV SOLN
0.0000 ug/min | INTRAVENOUS | Status: DC
  Administered 2018-04-10: 2 ug/min via INTRAVENOUS
  Administered 2018-04-11: 14 ug/min via INTRAVENOUS
  Administered 2018-04-11: 13 ug/min via INTRAVENOUS
  Administered 2018-04-11: 7 ug/min via INTRAVENOUS
  Administered 2018-04-11: 14 ug/min via INTRAVENOUS
  Administered 2018-04-11: 9 ug/min via INTRAVENOUS
  Administered 2018-04-12: 8 ug/min via INTRAVENOUS
  Filled 2018-04-10 (×9): qty 250

## 2018-04-10 MED ORDER — CHLORHEXIDINE GLUCONATE CLOTH 2 % EX PADS: 6.0000 | MEDICATED_PAD | Freq: Every day | CUTANEOUS | Status: DC

## 2018-04-10 MED ORDER — ALTEPLASE 2 MG IJ SOLR: 2.0000 mg | Freq: Once | INTRAMUSCULAR | Status: DC | PRN

## 2018-04-10 MED ORDER — DARBEPOETIN ALFA 150 MCG/0.3ML IJ SOSY: 150.0000 ug | PREFILLED_SYRINGE | INTRAMUSCULAR | Status: DC

## 2018-04-10 MED ORDER — HEPARIN SODIUM (PORCINE) 1000 UNIT/ML DIALYSIS
1000.0000 [IU] | INTRAMUSCULAR | Status: DC | PRN
  Administered 2018-04-10 (×2): 1400 [IU] via INTRAVENOUS_CENTRAL
  Administered 2018-04-15: 2800 [IU] via INTRAVENOUS_CENTRAL
  Filled 2018-04-10 (×5): qty 6
  Filled 2018-04-10: qty 3

## 2018-04-10 MED ORDER — LORAZEPAM 1 MG PO TABS
1.0000 mg | ORAL_TABLET | Freq: Once | ORAL | Status: AC
  Administered 2018-04-10: 1 mg via ORAL
  Filled 2018-04-10: qty 1

## 2018-04-10 MED ORDER — PRISMASOL BGK 0/2.5 32-2.5 MEQ/L IV SOLN
INTRAVENOUS | Status: DC
  Administered 2018-04-11 – 2018-04-14 (×4): via INTRAVENOUS_CENTRAL
  Filled 2018-04-10 (×6): qty 5000

## 2018-04-10 MED ORDER — PRISMASOL BGK 0/2.5 32-2.5 MEQ/L IV SOLN
INTRAVENOUS | Status: DC
  Administered 2018-04-11 – 2018-04-15 (×9): via INTRAVENOUS_CENTRAL
  Filled 2018-04-10 (×10): qty 5000

## 2018-04-10 MED ORDER — CHLORHEXIDINE GLUCONATE CLOTH 2 % EX PADS
6.0000 | MEDICATED_PAD | Freq: Every day | CUTANEOUS | Status: DC
  Administered 2018-04-11 – 2018-04-18 (×8): 6 via TOPICAL

## 2018-04-10 MED ORDER — SODIUM CHLORIDE 0.9% FLUSH: 10.0000 mL | INTRAVENOUS | Status: DC | PRN

## 2018-04-10 MED ORDER — PRISMASOL BGK 4/2.5 32-4-2.5 MEQ/L IV SOLN
INTRAVENOUS | Status: DC
  Administered 2018-04-10: 15:00:00 via INTRAVENOUS_CENTRAL
  Filled 2018-04-10: qty 5000

## 2018-04-10 NOTE — Consult Note (Signed)
Consultation Note Date: 04/10/2018   Patient Name: Eric Murillo  DOB: 13-Dec-1960  MRN: [id number]  Age / Sex: 57 y.o., male  PCP: Synetta Shadow, MD Referring Physician: Laurey Morale, MD  Reason for Consultation: Establishing goals of care  HPI/Patient Profile: 57 y.o. male  with past medical history of ESRD on HD for about 6 months, anemia of chronic disease, AICD, A. Flutter, Chronic CHF EF 20-25% Jan 2016, high blood pressure and cholesterol, Non-ischemic cardiomyopathy, Obesity, OSA admitted on 04/16/2018 with V. Tach, CHF, ESRD.   Clinical Assessment and Goals of Care: Eric Murillo is sitting up in the geri chair in his room. He greets me, making but not keeping eye contact.  He appears exhausted.   Present today at bedside is [name], [name] and a friend who leaves prior to PMT discussion.    Palliative Medicine is explained to patient and family.   We talk about CVVHD, access, and time trial.   We talk about medically maximized treatment for his heart problems.   We talk abut HD.  Eric Murillo tells me that he has only been on HD for about 6 months.  He tells me that he knows he can stop HD if he wants.   I ask several times,  "what questions do you have, what do you want to talk about, ask about?".  Eric Murillo tells me that he has no questions, but again, he appears to be exhausted.  I encourage family to continue these discussions rt choices.    We talk about HCPOA, see below.   We also talk about code status, see below.    Discussion with HF team Dr. Shirlee Latch rt clinical picture.  Discussion with SW Bridgette rt GOC discussion and outcome.   Eric Murillo will likely need fu with PMT after CVVHD started, considering how he tolerates, and more discussions about his choices and the "What if's".   Healthcare Power of atty.   NEXT OF KIN - Eric Murillo states is [name] is  his health care surrogate.  He is unmarried with no children.  He has other siblings, but they will accept Eric Murillo as Management consultant. When discussing code status, I ask [name] if he can abide Eric Murillo wishes if he does not want life support.  She tells me "whatever he wants".   SUMMARY OF RECOMMENDATIONS   Agreeable to CVVHD trial.  Agreeable to cardioversion/CPR if needed.   Agreeable to intubation if needed, we discuss length of time, trial vs long term. No decisions, states family will discuss in private.   Code Status/Advance Care Planning: Full code - We discuss the realities of CPR and intubation.  [name] states that "God is a God of Miracles".  I share that intubation can be for a time trial or "forever", until his heart stops.  I share a diagram of the chronic illness pathway, what is normal and expected.  I share that only he can decide, not the medical team and  not his family.  I encourage him to listen to what is laid on his heart.   Symptom Management:   Per hospitalist no additional needs at this time.   Nausea being addressed.   Palliative Prophylaxis:   Frequent Pain Assessment  Additional Recommendations (Limitations, Scope, Preferences):  Full Scope Treatment  Psycho-social/Spiritual:   Desire for further Chaplaincy support:no  Additional Recommendations: Caregiving  Support/Resources and Education on Hospice  Prognosis:   Unable to determine, based on outcomes.  Eric Murillo looks exhausted, has had 2 hospitalizations and 5 ED visits in 6 months, new to HD (6 months), and although albumin relatively high at 3.2, and good functional status,  3-6 months or less would not be surprising based on severity of Cardiac issues and stress response with HD.   Discharge Planning: To be determined, based on outcomes.  Home would be optimal, but there is concern for in hospital death.       Primary Diagnoses: Present on Admission: . Chronic systolic heart  failure (HCC)   I have reviewed the medical record, interviewed the patient and family, and examined the patient. The following aspects are pertinent.  Past Medical History:  Diagnosis Date  . AICD (automatic cardioverter/defibrillator) present   . Anemia of chronic disease   . Arthritis   . Atrial flutter (HCC)    DCCV 9/15  . Chest pain on exertion   . Chronic systolic congestive heart failure, NYHA class 2 (HCC)    EF 20-25% 1/16  . CKD (chronic kidney disease) stage 3, GFR 30-59 ml/min (HCC)   . Dyslipidemia   . Gout    Of big toe  . Hyperglycemia   . Hyperlipidemia   . Hypertension   . Morbid obesity with BMI of 45.0-49.9, adult (HCC)   . Non-ischemic cardiomyopathy (HCC)   . Organic erectile dysfunction   . OSA (obstructive sleep apnea)   . Pilonidal cyst   . Submandibular sialolithiasis    Right   Social History   Socioeconomic History  . Marital status: Single    Spouse name: Not on file  . Number of children: Not on file  . Years of education: Not on file  . Highest education level: Not on file  Occupational History  . Not on file  Social Needs  . Financial resource strain: Not on file  . Food insecurity:    Worry: Not on file    Inability: Not on file  . Transportation needs:    Medical: Not on file    Non-medical: Not on file  Tobacco Use  . Smoking status: Never Smoker  . Smokeless tobacco: Never Used  Substance and Sexual Activity  . Alcohol use: No    Alcohol/week: 0.0 standard drinks  . Drug use: No  . Sexual activity: Not on file  Lifestyle  . Physical activity:    Days per week: Not on file    Minutes per session: Not on file  . Stress: Not on file  Relationships  . Social connections:    Talks on phone: Not on file    Gets together: Not on file    Attends religious service: Not on file    Active member of club or organization: Not on file    Attends meetings of clubs or organizations: Not on file    Relationship status: Not on file   Other Topics Concern  . Not on file  Social History Narrative   Financial assistance approved for 100% discount at [facility]  and has [facility] card   [facility]  March 16, 2010 9:42 AM      Lives with mother.  Has a girlfriend   Family History  Problem Relation Age of Onset  . Hypertension Mother   . Hypertension Father   . Cancer Father        brother died of brain cancer; sister died of bone cancer  . Diabetes Sister   . Diabetes Brother   . Colon cancer Maternal Aunt   . Cardiomyopathy Neg Hx    Scheduled Meds: . allopurinol  100 mg Oral BID  . amiodarone  150 mg Intravenous Once  . apixaban  5 mg Oral BID  . calcitRIOL  0.5 mcg Oral Q T,Th,Sa-HD  . calcium acetate  1,334 mg Oral TID WC  . Chlorhexidine Gluconate Cloth  6 each Topical Daily  . [START ON 04/13/2018] darbepoetin (ARANESP) injection - DIALYSIS  150 mcg Intravenous Q Sat-HD  . mexiletine  150 mg Oral Q12H  . midodrine  10 mg Oral TID WC  . multivitamin  1 tablet Oral QHS  . ranolazine  500 mg Oral BID  . sodium chloride flush  10-40 mL Intracatheter Q12H  . sodium chloride flush  3 mL Intravenous Q12H  . sodium chloride flush  3 mL Intravenous Q12H   Continuous Infusions: . sodium chloride    . amiodarone 30 mg/hr (04/10/18 1200)  . dialysis replacement fluid (prismasate)    . dialysis replacement fluid (prismasate)    . dialysate (PRISMASATE)    . sodium chloride     PRN Meds:.sodium chloride, acetaminophen, alteplase, bismuth subsalicylate, heparin, HYDROcodone-acetaminophen, ondansetron (ZOFRAN) IV, promethazine, senna-docusate, sodium chloride, sodium chloride flush, sodium chloride flush Medications Prior to Admission:  Prior to Admission medications   Medication Sig Start Date End Date Taking? Authorizing Provider  allopurinol (ZYLOPRIM) 100 MG tablet TAKE 1 TABLET BY MOUTH TWO TIMES DAILY Patient taking differently: Take 100 mg by mouth 2 (two) times daily.  02/07/18  Yes Inez Catalina, MD    amiodarone (PACERONE) 200 MG tablet Take 1 tablet (200 mg total) by mouth 2 (two) times daily. 04/03/18  Yes Duke Salvia, MD  amoxicillin (AMOXIL) 500 MG capsule Take 500 mg by mouth 3 (three) times daily. 7 DAY COURSE STARTING ON 03/29/2018   Yes [provider]  apixaban (ELIQUIS) 2.5 MG TABS tablet Take 1 tablet (2.5 mg total) by mouth 2 (two) times daily. 03/13/18  Yes Levert Feinstein, MD  bismuth subsalicylate (PEPTO BISMOL) 262 MG chewable tablet Chew 524 mg by mouth daily as needed for indigestion.   Yes [provider]  calcitRIOL (ROCALTROL) 0.5 MCG capsule Take 1 capsule (0.5 mcg total) by mouth every other day. Patient taking differently: Take 0.5 mcg by mouth Every Tuesday,Thursday,and Saturday with dialysis.  11/24/17  Yes Alford Highland, NP  calcium acetate (PHOSLO) 667 MG capsule TAKE 2 CAPSULES (1,334 MG TOTAL) BY MOUTH 3 (THREE) TIMES DAILY WITH MEALS. 03/14/18  Yes Levert Feinstein, MD  carvedilol (COREG) 3.125 MG tablet Take 1 tablet (3.125 mg total) by mouth 2 (two) times daily with a meal. Give ONLY on non-HD days. Hold on dialysis days. 11/22/17  Yes Alford Highland, NP  diclofenac sodium (VOLTAREN) 1 % GEL Apply 4 g topically 4 (four) times daily. Patient taking differently: Apply 4 g topically 4 (four) times daily as needed (for pain at affected sites).  02/07/17  Yes Darreld Mclean, MD  HYDROcodone-acetaminophen (NORCO/VICODIN) 5-325 MG  tablet Take 1-2 tablets by mouth every 4 (four) hours as needed. 03/02/18  Yes Ivery Quale, PA-C  mexiletine (MEXITIL) 200 MG capsule Take 1 capsule (200 mg total) by mouth 2 (two) times daily. 04/03/18  Yes Duke Salvia, MD  multivitamin (RENA-VIT) TABS tablet Take 1 tablet by mouth at bedtime. 11/22/17  Yes Alford Highland, NP  clindamycin (CLEOCIN) 300 MG capsule Take 1 capsule (300 mg total) by mouth 3 (three) times daily. Patient not taking: Reported on 04/05/2018 03/02/18   Ivery Quale, PA-C   Allergies   Allergen Reactions  . Nsaids Other (See Comments)    Cannot take to due kidney issues  . Beta Adrenergic Blockers Other (See Comments)    An unnamed, white-colored Beta Blocker made him "feel funny" = made him feel "cagey"   Review of Systems  Unable to perform ROS: Other    Physical Exam  Constitutional: He is oriented to person, place, and time. No distress.  Appears acutely/chronically ill, exhausted. Makes and briefly keeps eye contact.   HENT:  Head: Normocephalic and atraumatic.  Cardiovascular: Normal rate.  Pulmonary/Chest: Effort normal. No respiratory distress.  Abdominal: Soft.  Neurological: He is alert and oriented to person, place, and time.  Skin: Skin is warm and dry.  Psychiatric:  Calm and cooperative  Nursing note and vitals reviewed.   Vital Signs: BP 106/75 (BP Location: Left Arm)   Pulse 74   Temp (!) 97.5 F (36.4 C) (Oral)   Resp (!) 23   Ht 5\' 9"  (1.753 m)   Wt 124.1 kg   SpO2 100%   BMI 40.40 kg/m  Pain Scale: 0-10   Pain Score: 0-No pain   SpO2: SpO2: 100 % O2 Device:SpO2: 100 % O2 Flow Rate: .O2 Flow Rate (L/min): 2 L/min  IO: Intake/output summary:   Intake/Output Summary (Last 24 hours) at 04/10/2018 1330 Last data filed at 04/10/2018 1200 Gross per 24 hour  Intake 517.58 ml  Output -  Net 517.58 ml    LBM: Last BM Date: 04/07/18 Baseline Weight: Weight: (!) 170.1 kg Most recent weight: Weight: 124.1 kg     Palliative Assessment/Data:   Flowsheet Rows     Most Recent Value  Intake Tab  Referral Department  Hospitalist  Unit at Time of Referral  ICU  Palliative Care Primary Diagnosis  Cardiac  Date Notified  04/09/18  Palliative Care Type  New Palliative care  Reason for referral  Clarify Goals of Care  Date of Admission  03/31/2018  Date first seen by Palliative Care  04/10/18  # of days Palliative referral response time  1 Day(s)  # of days IP prior to Palliative referral  5  Clinical Assessment  Palliative  Performance Scale Score  50%  Pain Max last 24 hours  Not able to report  Pain Min Last 24 hours  Not able to report  Dyspnea Max Last 24 Hours  Not able to report  Dyspnea Min Last 24 hours  Not able to report  Psychosocial & Spiritual Assessment  Palliative Care Outcomes  Patient/Family meeting held?  Yes  Who was at the meeting?  patient, [name] and Eric Murillo.   Palliative Care Outcomes  Clarified goals of care, Provided psychosocial or spiritual support, Provided advance care planning      Time In: 1050 Time Out: 1110 Time Total: 80 minutes Greater than 50%  of this time was spent counseling and coordinating care related to the above assessment and plan.  Signed by: Drue Novel, NP   Please contact Palliative Medicine Team phone at [phone] for questions and concerns.  For individual provider: See Shea Evans

## 2018-04-10 NOTE — Progress Notes (Signed)
Patient oxygen dropping below 88 while sleeping. Patient educated on the need for supplemental oxygen via nasal cannula while sleep. Patient refusing oxygen despite multiple attempts to educate.

## 2018-04-10 NOTE — Procedures (Signed)
Hemodialysis Catheter Insertion Procedure Note Eric Murillo [id number] 09/25/1960  Procedure: Insertion of Hemodialysis Catheter Indications: Hemodialysis  Procedure Details Consent: Risks of procedure as well as the alternatives and risks of each were explained to the (patient/caregiver).  Consent for procedure obtained.  Time Out: Verified patient identification, verified procedure, site/side was marked, verified correct patient position, special equipment/implants available, medications/allergies/relevent history reviewed, required imaging and test results available.  Performed  Maximum sterile technique was used including antiseptics, cap, gloves, gown, hand hygiene, mask and sheet.  Skin prep: Chlorhexidine; local anesthetic administered  A Trialysis HD catheter was placed in the left internal jugular vein using the Seldinger technique.  Evaluation Blood flow good Complications: No apparent complications Patient did tolerate procedure well. Chest X-ray ordered to verify placement.  CXR: pending.   Procedure performed under direct supervision of Dr. Dorise Hiss and with ultrasound guidance for real time vessel cannulation.     Canary Brim, NP-C [facility] Pgr: [phone] or [phone] 04/10/2018, 1:57 PM

## 2018-04-10 NOTE — Progress Notes (Signed)
Subjective: Interval History: had VT arrest on HD yesterday, procedure was aborted. No new problems, no further VT arrest overnight.   Objective: Vital signs in last 24 hours: Temp:  [97.4 F (36.3 C)-98.1 F (36.7 C)] 97.7 F (36.5 C) (09/18 0743) Pulse Rate:  [73-97] 79 (09/18 1105) Resp:  [13-30] 14 (09/18 1105) BP: (92-145)/(62-107) 117/68 (09/18 1105) SpO2:  [86 %-99 %] 97 % (09/18 1105) Weight:  [124.1 kg] 124.1 kg (09/18 0500) Weight change: 0 kg  Intake/Output from previous day: 09/17 0701 - 09/18 0700 In: 346.2 [P.O.:120; I.V.:226.2] Out: 950  Intake/Output this shift: Total I/O In: 325.8 [P.O.:60; I.V.:265.8] Out: -   General appearance: alert, cooperative Resp: diminished breath sounds bilaterally Cardio: S1, S2 normal and systolic murmur: holosystolic 2/6, blowing at apex GI: obese, pos bs, soft Extremities: edema 1+ LE RUA AVF+bruit    CXR 9/14 mild pvc  HD: [facility]/ TTS 4.5h  122.5 kg  2/2 bath Hep none  RUA AVF - calc 0.5 ug tiw - venofer 7/10 completed 100 mg qhd   Assessment: 1 ESRD HD TTS. Has ^^filling pressures from R heart cath, per cards hopefully getting vol down may improve VT.  Will transition to CRRT given complications on regular HD.  Overall prognosis poor.  2 Anemia stable Hb 11 3 Obesity 4 HPTH meds 5 VT storm - EP following 6 Vol ^ lower as tol 7 HTN bp's soft, off home coreg  P as above   Maree Krabbe 05-04-2018,11:03 AM  Lab Results: Recent Labs    04/09/18 0307 04/10/18 0617  WBC 12.0* 12.9*  HGB 10.8* 11.0*  HCT 36.3* 36.2*  PLT 214 233   BMET:  Recent Labs    04/09/18 0311 04/10/18 0617  NA 134* 133*  K 5.2* 5.6*  CL 97* 93*  CO2 23 24  GLUCOSE 93 100*  BUN 58* 52*  CREATININE 7.79* 8.07*  CALCIUM 9.0 9.2   No results for input(s): PTH in the last 72 hours. Iron Studies: No results for input(s): IRON, TIBC, TRANSFERRIN, FERRITIN in the last 72 hours.  Studies/Results: No results  found.  I have reviewed the patient's current medications.

## 2018-04-10 NOTE — Plan of Care (Signed)
  Problem: Clinical Measurements: Goal: Ability to maintain clinical measurements within normal limits will improve Outcome: Progressing   Problem: Nutrition: Goal: Adequate nutrition will be maintained Outcome: Progressing   Problem: Safety: Goal: Ability to remain free from injury will improve Outcome: Progressing   

## 2018-04-10 NOTE — Plan of Care (Signed)
  Problem: Health Behavior/Discharge Planning: Goal: Ability to manage health-related needs will improve Outcome: Progressing   Problem: Clinical Measurements: Goal: Ability to maintain clinical measurements within normal limits will improve Outcome: Progressing Goal: Will remain free from infection Outcome: Progressing   

## 2018-04-10 NOTE — Progress Notes (Signed)
Patient K+ 6.2. Per AM RN, call Glenna Fellows, MD if greater than 5.5. Glenna Fellows, MD called and notified. Orders that she will change prismasol bags.   Patient temp 92.8 axillary. Patient had bear hugger on. Patient is refusing bear hugger and additional warm blankets at this time.

## 2018-04-10 NOTE — Progress Notes (Addendum)
Advanced Heart Failure Rounding Note  PCP-Cardiologist: [name], MD   Subjective:    Presented to [facility] after a syncopal episode. 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 Transferred to [facility] and had another episode of VT in ED that required ATP.   9/18 VT arrest . Amio drip restarted.   Frustrated. Having some nausea.   RHC/LHC 04/17/2018  1. Low, but not markedly low, cardiac output.  2. Pulmonary venous hypertension.  3. Markedly elevated right and left heart filling pressures.   4. No significant coronary disease.   RHC Procedural Findings: Hemodynamics (mmHg) RA mean 18 RV 65/17 PA 69/30, mean 47 PCWP mean 36 LV 100/27 AO 97/65 Oxygen saturations: PA 52% AO 94% Cardiac Output (Fick) 4.98  Cardiac Index (Fick) 2.12 PVR 2.2 WU Cardiac Output (Thermo) 4.85 Cardiac Index (Thermo) 2.06  PVR 2.3 WU   Objective:   Weight Range: 124.1 kg Body mass index is 40.4 kg/m.   Vital Signs:   Temp:  [97.4 F (36.3 C)-98.1 F (36.7 C)] 97.7 F (36.5 C) (09/18 0743) Pulse Rate:  [73-97] 84 (09/18 0915) Resp:  [13-30] 19 (09/18 0915) BP: (61-145)/(39-107) 115/81 (09/18 0915) SpO2:  [86 %-99 %] 88 % (09/18 0900) Weight:  [123.3 kg-124.1 kg] 124.1 kg (09/18 0500) Last BM Date: 04/07/18  Weight change: Filed Weights   04/09/18 0715 04/09/18 1050 04/10/18 0500  Weight: 124.2 kg 123.3 kg 124.1 kg    Intake/Output:   Intake/Output Summary (Last 24 hours) at 04/10/2018 1041 Last data filed at 04/10/2018 0900 Gross per 24 hour  Intake 535.84 ml  Output 950 ml  Net -414.16 ml      Physical Exam  General: Appears fatigued. No resp difficulty. Sitting in the chair.  HEENT: normal Neck: supple. JVD elevated. Carotids 2+ bilat; no bruits. No lymphadenopathy or thryomegaly appreciated. LIJ  Cor: PMI nondisplaced. Regular rate & rhythm. No rubs, gallops or murmurs. Lungs: clear Abdomen: soft, nontender, nondistended. No  hepatosplenomegaly. No bruits or masses. Good bowel sounds. Extremities: no cyanosis, clubbing, rash, edema. RUE AVF  Neuro: alert & orientedx3, cranial nerves grossly intact. moves all 4 extremities w/o difficulty. Affect flat     Telemetry  SR with  NSVT this morning.   EKG    No new tracings.    Labs    CBC Recent Labs    04/09/18 0307 04/10/18 0617  WBC 12.0* 12.9*  NEUTROABS  --  9.0*  HGB 10.8* 11.0*  HCT 36.3* 36.2*  MCV 96.5 96.8  PLT 214 233   Basic Metabolic Panel Recent Labs    88/89/16 0307 04/09/18 0311 04/10/18 0617  NA  --  134* 133*  K  --  5.2* 5.6*  CL  --  97* 93*  CO2  --  23 24  GLUCOSE  --  93 100*  BUN  --  58* 52*  CREATININE  --  7.79* 8.07*  CALCIUM  --  9.0 9.2  MG 2.4  --  2.6*  PHOS  --  5.2* 6.2*   Liver Function Tests Recent Labs    04/09/18 0311 04/10/18 0617  ALBUMIN 3.2* 3.3*   No results for input(s): LIPASE, AMYLASE in the last 72 hours. Cardiac Enzymes No results for input(s): CKTOTAL, CKMB, CKMBINDEX, TROPONINI in the last 72 hours.  BNP: BNP (last 3 results) Recent Labs    07/26/17 0916 11/03/17 1238 11/13/17 0400  BNP 399.7* 1,160.1* 1,085.3*    ProBNP (last 3 results)  No results for input(s): PROBNP in the last 8760 hours.   D-Dimer No results for input(s): DDIMER in the last 72 hours. Hemoglobin A1C No results for input(s): HGBA1C in the last 72 hours. Fasting Lipid Panel No results for input(s): CHOL, HDL, LDLCALC, TRIG, CHOLHDL, LDLDIRECT in the last 72 hours. Thyroid Function Tests No results for input(s): TSH, T4TOTAL, T3FREE, THYROIDAB in the last 72 hours.  Invalid input(s): FREET3  Other results:   Imaging    No results found.   Medications:     Scheduled Medications: . allopurinol  100 mg Oral BID  . amiodarone  150 mg Intravenous Once  . apixaban  5 mg Oral BID  . calcitRIOL  0.5 mcg Oral Q T,Th,Sa-HD  . calcium acetate  1,334 mg Oral TID WC  . Chlorhexidine Gluconate  Cloth  6 each Topical Daily  . [START ON 04/13/2018] darbepoetin (ARANESP) injection - DIALYSIS  150 mcg Intravenous Q Sat-HD  . mexiletine  150 mg Oral Q12H  . midodrine  10 mg Oral TID WC  . multivitamin  1 tablet Oral QHS  . ranolazine  500 mg Oral BID  . sodium chloride flush  10-40 mL Intracatheter Q12H  . sodium chloride flush  3 mL Intravenous Q12H  . sodium chloride flush  3 mL Intravenous Q12H    Infusions: . sodium chloride    . amiodarone 30 mg/hr (04/10/18 0900)    PRN Medications: sodium chloride, acetaminophen, bismuth subsalicylate, HYDROcodone-acetaminophen, ondansetron (ZOFRAN) IV, promethazine, senna-docusate, sodium chloride flush, sodium chloride flush    Patient Profile   Eric Murillo is a 57 y.o. male with history of morbid obesity, hyperlipidemia, HTN, atrial flutter (s/p TEE DC-CV 04/03/14 and again in 8/17), NICM with chronic systolic HF and severe OSA but unable to tolerate CPAP.    Assessment/Plan   1. Recurrent VT requiring shock and ATP. - Follows with Dr Graciela Husbands. EP has been consulted. Last saw Dr Graciela Husbands 9/11 and was having recurrent episodes of VF. Mexilitine was added in addition to his amio. - Lidocaine stopped 04/07/18. - Had VT arrest 9/17. Continue amio drip+  mexiletine 200 mg BID at home.  Defer medications to EP.   2. Chronic systolic heart failure: Nonischemic cardiomyopathy, likely related to HTN.  Echo in 8/14 with EF 45% but EF down to 25-30% on TEE while in atrial flutter (03/2014).  Echo (1/16) with EF ~25% and echo in 6/16 with EF 30-35%.  No cardiac MRI done with elevated creatinine and size. There was concern for cardiac amyloidosis.  However, negative SPEP and abdominal fat pad biopsy negative. Low voltage on ECG may be due to obesity and not amyloidosis. Echo 10/2017: EF 20-25%, mild to mod MR. S/P [employer] ICD -Marked volume overload on cath. Starting CVVHD.  - Continue carvedilol 3.125 mg BID on non-HD days.  - No spiro/dig/arb with  CKD.  - No bidil with soft BPs  - Not a candidate for VAD since he is ESRD. BMI 40. Not a transplant candidate.   3. Atypical Atrial Flutter: Recurrent - S/p several DCCVs, most recently 11/21/17. Has been seen by EP. Not felt to be amenable to ablation.  - on eliqus 5 mg twice a day. .   4. ESRD - Now on HD T/R/Sat Intolerant iHD. Starting CVVHD today.  - Appreciate nephrology input.   5. HTN:   - Stable.   6. OSA: - Has been unable to tolerate CPAP  7. Morbid Obesity:  - Body mass index  is 40.4 kg/m.    Palliative Care consulted.  Length of Stay: 6  Eric Clegg, NP  04/10/2018, 10:41 AM  Advanced Heart Failure Team Pager [phone] (M-F; 7a - 4p)  Please contact [facility] for night-coverage after hours (4p -7a ) and weekends on [email or web address]  Patient seen with NP, agree with the above note.    HD yesterday was aborted.  He developed hypotension then VT on HD, brief CPR then back to NSR.  He was put back on amiodarone gtt.  Today, he feels fatigued.   On exam, JVP elevated.  Clear lungs. 1+ edema to knees.  Regular S1S2.   Patient was noted to be markedly volume overloaded on RHC.  This could be cause of anorexia and is likely the cause of dyspnea.  Marked volume overload/ventricular stretch may also be triggering VT.  Unfortunately, BP has not tolerated regular hemodialysis to allow fluid removal even with midodrine use.   - Discussed with Dr. Arlean Hopping today, will try CVVH.  Will need line placement.  If we cannot successfully remove fluid via CVVH, may not have any other good options.   For VT, he is currently on amiodarone gtt and mexiletine.  Will eventually transition amiodarone back to po.   He has end stage HF, not candidate for LVAD and unlikely to be able to get to heart/kidney transplant.  Agree with palliative care for goals of care.  If we are unable to remove fluid via CVVH, may not have other options.   [name] 04/10/2018 12:19 PM

## 2018-04-10 NOTE — Progress Notes (Signed)
Carncelli, MD paged regarding patient increased need for Levophed, patient nonadherent to bedrest with CRRT, and patient anxiety. Verbal orders to keep MAP >65 OR SBP >90. Carncelli, MD spoke with patient regarding need to stay in bed. Orders to give anti-anxiety medication, if that does not help, patient may sit on side of bed for a short period of time but explained to patient that it will not be all night.

## 2018-04-11 ENCOUNTER — Inpatient Hospital Stay (HOSPITAL_COMMUNITY): Payer: Medicaid Other

## 2018-04-11 LAB — RENAL FUNCTION PANEL
ALBUMIN: 3.5 g/dL (ref 3.5–5.0)
ANION GAP: 25 — AB (ref 5–15)
Albumin: 3.8 g/dL (ref 3.5–5.0)
Anion gap: 16 — ABNORMAL HIGH (ref 5–15)
BUN: 43 mg/dL — ABNORMAL HIGH (ref 6–20)
BUN: 25 mg/dL — ABNORMAL HIGH (ref 6–20)
CO2: 16 mmol/L — ABNORMAL LOW (ref 22–32)
CO2: 23 mmol/L (ref 22–32)
CREATININE: 3.14 mg/dL — AB (ref 0.61–1.24)
Calcium: 9.2 mg/dL (ref 8.9–10.3)
Calcium: 8.9 mg/dL (ref 8.9–10.3)
Chloride: 94 mmol/L — ABNORMAL LOW (ref 98–111)
Chloride: 94 mmol/L — ABNORMAL LOW (ref 98–111)
Creatinine, Ser: 6.18 mg/dL — ABNORMAL HIGH (ref 0.61–1.24)
GFR calc Af Amer: 10 mL/min — ABNORMAL LOW (ref >60)
GFR calc non Af Amer: 9 mL/min — ABNORMAL LOW (ref >60)
GFR calc non Af Amer: 20 mL/min — ABNORMAL LOW (ref >60)
GFR, EST AFRICAN AMERICAN: 24 mL/min — AB (ref >60)
GLUCOSE: 85 mg/dL (ref 70–99)
GLUCOSE: 126 mg/dL — AB (ref 70–99)
POTASSIUM: 6.1 mmol/L — AB (ref 3.5–5.1)
Phosphorus: 7.3 mg/dL — ABNORMAL HIGH (ref 2.5–4.6)
Phosphorus: 3.3 mg/dL (ref 2.5–4.6)
Potassium: 4.6 mmol/L (ref 3.5–5.1)
Sodium: 135 mmol/L (ref 135–145)
Sodium: 133 mmol/L — ABNORMAL LOW (ref 135–145)

## 2018-04-11 LAB — CBC WITH DIFFERENTIAL/PLATELET
Abs Immature Granulocytes: 0.3 10*3/uL — ABNORMAL HIGH (ref 0.0–0.1)
BASOS ABS: 0.1 10*3/uL (ref 0.0–0.1)
BASOS PCT: 0 %
EOS ABS: 0 10*3/uL (ref 0.0–0.7)
EOS PCT: 0 %
HCT: 39.8 % (ref 39.0–52.0)
HEMOGLOBIN: 11.6 g/dL — AB (ref 13.0–17.0)
Immature Granulocytes: 2 %
Lymphocytes Relative: 4 %
Lymphs Abs: 0.9 10*3/uL (ref 0.7–4.0)
MCH: 29.3 pg (ref 26.0–34.0)
MCHC: 29.1 g/dL — AB (ref 30.0–36.0)
MCV: 100.5 fL — ABNORMAL HIGH (ref 78.0–100.0)
MONO ABS: 3.1 10*3/uL — AB (ref 0.1–1.0)
Monocytes Relative: 15 %
Neutro Abs: 17 10*3/uL — ABNORMAL HIGH (ref 1.7–7.7)
Neutrophils Relative %: 79 %
PLATELETS: 285 10*3/uL (ref 150–400)
RBC: 3.96 MIL/uL — ABNORMAL LOW (ref 4.22–5.81)
RDW: 14.7 % (ref 11.5–15.5)
WBC: 21.4 10*3/uL — AB (ref 4.0–10.5)

## 2018-04-11 LAB — MAGNESIUM: MAGNESIUM: 2.6 mg/dL — AB (ref 1.7–2.4)

## 2018-04-11 MED ORDER — PRISMASOL BGK 4/2.5 32-4-2.5 MEQ/L IV SOLN
INTRAVENOUS | Status: DC
  Administered 2018-04-11 – 2018-04-15 (×33): via INTRAVENOUS_CENTRAL
  Filled 2018-04-11 (×38): qty 5000

## 2018-04-11 NOTE — Progress Notes (Signed)
After more education with help of family, patient agreeable to oxygen while sleep and the bear hugger at this time.

## 2018-04-11 NOTE — Progress Notes (Addendum)
Subjective: Interval History: up in chair, restless, SOB per family and "dizzy" per patient  Objective: Vital signs in last 24 hours: Temp:  [92.8 F (33.8 C)-97.5 F (36.4 C)] 97.3 F (36.3 C) (09/19 0400) Pulse Rate:  [60-78] 64 (09/19 1000) Resp:  [12-34] 30 (09/19 1000) BP: (73-142)/(23-95) 125/42 (09/19 1000) SpO2:  [76 %-100 %] 100 % (09/19 1000) Weight change:   Intake/Output from previous day: 09/18 0701 - 09/19 0700 In: 1105.5 [P.O.:180; I.V.:925.5] Out: 1498  Intake/Output this shift: Total I/O In: 221.1 [P.O.:120; I.V.:101.1] Out: 477 [Other:477]  General appearance: alert, cooperative Resp: diminished breath sounds bilaterally Cardio: S1, S2 normal and systolic murmur: holosystolic 2/6, blowing at apex GI: obese, pos bs, soft Extremities: edema 1+ LE RUA AVF+bruit    CXR 9/14 mild pvc  HD: [facility]/ TTS 4.5h  122.5 kg  2/2 bath Hep none  RUA AVF - calc 0.5 ug tiw - venofer 7/10 completed 100 mg qhd   Assessment/Plan: 1 ESRD HD TTS. Has ^^filling pressures from R heart cath, per cards hopefully getting vol down may improve VT.  Did not tolerate regular dialysis.  Now on CRRT d#2 to attempt to lower volume and improve VT.  ^UF to 100- 150 cc/hr as tolerated. Is on levo gtt now.  2 Hyperkalemia - dialysate/ RF's adjusted 3 Obesity 4 HPTH meds 5 VT storm - per cardiology , on IV amio/ po mexilitene 6 Vol ^ lower as tol 7 HTN bp's soft, off home coreg 8 Anemia stable Hb 11   Eric Moselle MD [employer] pgr [phone]   04/11/2018, 11:10 AM       Lab Results: Recent Labs    04/10/18 0617 04/11/18 0415  WBC 12.9* 21.4*  HGB 11.0* 11.6*  HCT 36.2* 39.8  PLT 233 285   BMET:  Recent Labs    04/10/18 2227 04/11/18 0524  NA 136 135  K 6.2* 6.1*  CL 93* 94*  CO2 19* 16*  GLUCOSE 80 85  BUN 51* 43*  CREATININE 7.47* 6.18*  CALCIUM 9.4 9.2   No results for input(s): PTH in the last 72 hours. Iron Studies: No  results for input(s): IRON, TIBC, TRANSFERRIN, FERRITIN in the last 72 hours.  Studies/Results: Dg Chest Port 1 View  Result Date: 04/11/2018 CLINICAL DATA:  Shortness of breath. History of CHF, cardiac dysrhythmias, end-stage renal disease, morbid obesity. EXAM: PORTABLE CHEST 1 VIEW COMPARISON:  Portable chest x-ray of April 10, 2018 FINDINGS: The right lung is mildly hypoinflated secondary to an elevated hemidiaphragm which is a chronic finding. The left lung is better inflated. The cardiac silhouette is enlarged. The pulmonary vascularity is not engorged. The ICD is in stable position. The dual-lumen dialysis catheter tip projects over the midportion of the SVC. IMPRESSION: Mild chronic hypoinflation on the right. No alveolar pneumonia nor pulmonary edema. Stable enlargement of cardiac silhouette without significant pulmonary vascular congestion. Electronically Signed   By: David  [place] M.D.   On: 04/11/2018 10:24   Dg Chest Port 1 View  Result Date: 04/10/2018 CLINICAL DATA:  Central line placement. EXAM: PORTABLE CHEST 1 VIEW COMPARISON:  04/06/2018 and 04-17-18 FINDINGS: Left jugular vein catheter has been inserted. The tip is at the cavoatrial junction in good position. No pneumothorax. Chronic mild cardiomegaly. AICD in place. Pulmonary vascularity is normal. Chronic elevation of the right hemidiaphragm. No discrete infiltrates or effusions. IMPRESSION: No acute abnormalities.  New central line appears in good position. Electronically Signed   By: Fayrene Fearing  Maxwell M.D.   On: 04/10/2018 15:07    I have reviewed the patient's current medications.

## 2018-04-11 NOTE — Progress Notes (Addendum)
Advanced Heart Failure Rounding Note  PCP-Cardiologist: [name], MD   Subjective:    Presented to [facility] ED after a syncopal episode. 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 Transferred to [facility] and had another episode of VT in ED that required ATP.   9/18 VT arrest . Amio drip restarted. Started CVVHD  Yesterday norepi started. Norepi currently at 14 mcg. WBC up to 21.   Having a hard time sleeping. Denies SOB.    RHC/LHC Apr 29, 2018  1. Low, but not markedly low, cardiac output.  2. Pulmonary venous hypertension.  3. Markedly elevated right and left heart filling pressures.   4. No significant coronary disease.   RHC Procedural Findings: Hemodynamics (mmHg) RA mean 18 RV 65/17 PA 69/30, mean 47 PCWP mean 36 LV 100/27 AO 97/65 Oxygen saturations: PA 52% AO 94% Cardiac Output (Fick) 4.98  Cardiac Index (Fick) 2.12 PVR 2.2 WU Cardiac Output (Thermo) 4.85 Cardiac Index (Thermo) 2.06  PVR 2.3 WU   Objective:   Weight Range: 124.1 kg Body mass index is 40.4 kg/m.   Vital Signs:   Temp:  [92.8 F (33.8 C)-97.5 F (36.4 C)] 97.3 F (36.3 C) (09/19 0400) Pulse Rate:  [61-84] 62 (09/19 0703) Resp:  [12-34] 32 (09/19 0703) BP: (73-142)/(38-97) 94/53 (09/19 0703) SpO2:  [76 %-100 %] 95 % (09/19 0703) Last BM Date: 04/07/18  Weight change: Filed Weights   04/09/18 0715 04/09/18 1050 04/10/18 0500  Weight: 124.2 kg 123.3 kg 124.1 kg    Intake/Output:   Intake/Output Summary (Last 24 hours) at 04/11/2018 0759 Last data filed at 04/11/2018 0700 Gross per 24 hour  Intake 1105.49 ml  Output 1498 ml  Net -392.51 ml      Physical Exam  General:  Appears fatigued. No resp difficulty HEENT: normal Neck: supple. JVP to jaw. Carotids 2+ bilat; no bruits. No lymphadenopathy or thryomegaly appreciated. LIJ HD catheter.  Cor: PMI nondisplaced. Regular rate & rhythm. No rubs, gallops or murmurs. Lungs: clear Abdomen: soft, nontender,  nondistended. No hepatosplenomegaly. No bruits or masses. Good bowel sounds. Extremities: no cyanosis, clubbing, rash, edema Neuro: alert & orientedx3, cranial nerves grossly intact. moves all 4 extremities w/o difficulty. Affect pleasant    Telemetry  SR 60s QRS wide. Personally reviewed  EKG   Pending.   Labs    CBC Recent Labs    04/10/18 0617 04/11/18 0415  WBC 12.9* 21.4*  NEUTROABS 9.0* 17.0*  HGB 11.0* 11.6*  HCT 36.2* 39.8  MCV 96.8 100.5*  PLT 233 285   Basic Metabolic Panel Recent Labs    28/20/60 0617 04/10/18 1555 04/10/18 2227 04/11/18 0415 04/11/18 0524  NA 133* 134* 136  --  135  K 5.6* 6.0* 6.2*  --  6.1*  CL 93* 95* 93*  --  94*  CO2 24 24 19*  --  16*  GLUCOSE 100* 100* 80  --  85  BUN 52* 51* 51*  --  43*  CREATININE 8.07* 7.27* 7.47*  --  6.18*  CALCIUM 9.2 9.0 9.4  --  9.2  MG 2.6*  --   --  2.6*  --   PHOS 6.2* 6.1*  --   --  7.3*   Liver Function Tests Recent Labs    04/10/18 1555 04/11/18 0524  ALBUMIN 3.2* 3.5   No results for input(s): LIPASE, AMYLASE in the last 72 hours. Cardiac Enzymes No results for input(s): CKTOTAL, CKMB, CKMBINDEX, TROPONINI in the last 72 hours.  BNP: BNP (last 3 results) Recent Labs    07/26/17 0916 11/03/17 1238 11/13/17 0400  BNP 399.7* 1,160.1* 1,085.3*    ProBNP (last 3 results) No results for input(s): PROBNP in the last 8760 hours.   D-Dimer No results for input(s): DDIMER in the last 72 hours. Hemoglobin A1C No results for input(s): HGBA1C in the last 72 hours. Fasting Lipid Panel No results for input(s): CHOL, HDL, LDLCALC, TRIG, CHOLHDL, LDLDIRECT in the last 72 hours. Thyroid Function Tests No results for input(s): TSH, T4TOTAL, T3FREE, THYROIDAB in the last 72 hours.  Invalid input(s): FREET3  Other results:   Imaging    Dg Chest Port 1 View  Result Date: 04/10/2018 CLINICAL DATA:  Central line placement. EXAM: PORTABLE CHEST 1 VIEW COMPARISON:  04/06/2018 and  May 04, 2018 FINDINGS: Left jugular vein catheter has been inserted. The tip is at the cavoatrial junction in good position. No pneumothorax. Chronic mild cardiomegaly. AICD in place. Pulmonary vascularity is normal. Chronic elevation of the right hemidiaphragm. No discrete infiltrates or effusions. IMPRESSION: No acute abnormalities.  New central line appears in good position. Electronically Signed   By: Francene Boyers M.D.   On: 04/10/2018 15:07     Medications:     Scheduled Medications: . allopurinol  100 mg Oral BID  . amiodarone  150 mg Intravenous Once  . apixaban  5 mg Oral BID  . calcitRIOL  0.5 mcg Oral Q T,Th,Sa-HD  . calcium acetate  1,334 mg Oral TID WC  . Chlorhexidine Gluconate Cloth  6 each Topical Daily  . [START ON 04/13/2018] darbepoetin (ARANESP) injection - DIALYSIS  150 mcg Intravenous Q Sat-HD  . mexiletine  150 mg Oral Q12H  . midodrine  10 mg Oral TID WC  . multivitamin  1 tablet Oral QHS  . ranolazine  500 mg Oral BID  . sodium chloride flush  10-40 mL Intracatheter Q12H  . sodium chloride flush  3 mL Intravenous Q12H  . sodium chloride flush  3 mL Intravenous Q12H    Infusions: . sodium chloride    . amiodarone 30 mg/hr (04/11/18 0700)  . norepinephrine (LEVOPHED) Adult infusion 14 mcg/min (04/11/18 0700)  . dialysis replacement fluid (prismasate) 400 mL/hr at 04/11/18 0011  . dialysis replacement fluid (prismasate) 200 mL/hr at 04/11/18 0013  . dialysate (PRISMASATE) 2,000 mL/hr at 04/11/18 0644  . sodium chloride      PRN Medications: sodium chloride, acetaminophen, alteplase, bismuth subsalicylate, heparin, HYDROcodone-acetaminophen, ondansetron (ZOFRAN) IV, promethazine, senna-docusate, sodium chloride, sodium chloride flush, sodium chloride flush    Patient Profile   Eric Murillo is a 57 y.o. male with history of morbid obesity, hyperlipidemia, HTN, atrial flutter (s/p TEE DC-CV 04/03/14 and again in 8/17), NICM with chronic systolic HF and  severe OSA but unable to tolerate CPAP.    Assessment/Plan   1. Recurrent VT requiring shock and ATP. - Follows with Dr Graciela Husbands. EP has been consulted. Last saw Dr Graciela Husbands 9/11 and was having recurrent episodes of VF. Mexilitine was added in addition to his amio. - Lidocaine stopped 04/07/18. - Had VT arrest 9/17.  -Continue amio drip 30 mg +  mexiletine 200 mg BID at home.  Defer medications to EP.   2. Chronic systolic heart failure: Nonischemic cardiomyopathy, likely related to HTN.  Echo in 8/14 with EF 45% but EF down to 25-30% on TEE while in atrial flutter (03/2014).  Echo (1/16) with EF ~25% and echo in 6/16 with EF 30-35%.  No cardiac MRI done with  elevated creatinine and size. There was concern for cardiac amyloidosis.  However, negative SPEP and abdominal fat pad biopsy negative. Low voltage on ECG may be due to obesity and not amyloidosis. Echo 10/2017: EF 20-25%, mild to mod MR. S/P St Jude ICD - Started on CVVHD 9/18  --Marked volume overload on cath. Starting CVVHD.  - Stop carvedilol for now. .  - No spiro/dig/arb with CKD.  - No bidil with soft BPs  - Not a candidate for VAD since he is ESRD. BMI 40. Not a transplant candidate.   3. Atypical Atrial Flutter: Recurrent - S/p several DCCVs, most recently 11/21/17. Has been seen by EP. Not felt to be amenable to ablation.  - on eliqus 5 mg twice a day. .   4. ESRD - Now on HD T/R/Sat Intolerant iHD.  - Started CVVHD on 9/18 .  - Appreciate nephrology input.   5. HTN:   Hypotensive with CVVHD  6. OSA: - Has been unable to tolerate CPAP  7. Morbid Obesity:  - Body mass index is 40.4 kg/m.   8. Hypekalemia K 6.1  9. ID  WBC   Palliative Care consulted appreciated. He wished to remain full code.   Length of Stay: 7  Amy Clegg, NP  04/11/2018, 7:59 AM  Advanced Heart Failure Team Pager [phone] (M-F; 7a - 4p)  Please contact [facility] for night-coverage after hours (4p -7a ) and weekends on  [email or web address]  Patient seen with NP, agree with the above note.    He is now on CVVH to try to remove fluid more effectively.  Currently UF at 75 cc/hr, requiring norepinephrine 14.  He is fatigued and occasionally dizzy, SBP in 100s-110s currently.  No further VT.    On exam, JVP elevated.  Clear lungs. 1+ edema to knees.  Regular S1S2.   Patient was noted to be markedly volume overloaded on RHC. This could be cause of anorexia and is likely the cause of dyspnea. Marked volume overload/ventricular stretch may also be triggering VT. Unfortunately, BP has not tolerated regular hemodialysis to allow fluid removal even with midodrine use. Currently tolerating CVVH with UF 75 cc/hr with norepinephrine 14.  - Continue norepinephrine while we are trying to aggressively remove fluid.  Increase UF to 100 cc/hr, can increase further if tolerates.  For VT, he is currently on amiodarone gtt and mexiletine.  Will eventually transition amiodarone back to po, keep IV while on norepinephrine.   He has end stage HF, not candidate for LVAD and unlikely to be able to get to heart/kidney transplant.  Palliative care following, so far patient wants to be full code.  If we are unable to remove fluid via CVVH and transition back to stable HD, we will not have further options for him.  [name] 04/11/2018 8:41 AM

## 2018-04-11 NOTE — Progress Notes (Signed)
Patient stating "I cant breathe in bed" and insisting on getting oob. Rn and NP not able to convince patient to stay in bed. RN had very frank conversation that we are all worried about him coding because of his long QRS . Patient states he understands the risk and willing to take it. Assisted to chair with 2 assist,chair alarm in place as well as floor pads. Continue to monitor. Tammy Sours

## 2018-04-12 ENCOUNTER — Ambulatory Visit (HOSPITAL_COMMUNITY): Admit: 2018-04-12 | Payer: Medicaid Other | Admitting: Cardiology

## 2018-04-12 ENCOUNTER — Inpatient Hospital Stay (HOSPITAL_COMMUNITY): Payer: Medicaid Other

## 2018-04-12 ENCOUNTER — Encounter (HOSPITAL_COMMUNITY): Payer: Self-pay

## 2018-04-12 DIAGNOSIS — Z7189 Other specified counseling: Secondary | ICD-10-CM

## 2018-04-12 DIAGNOSIS — Z515 Encounter for palliative care: Secondary | ICD-10-CM

## 2018-04-12 LAB — RENAL FUNCTION PANEL
ANION GAP: 14 (ref 5–15)
Albumin: 3.7 g/dL (ref 3.5–5.0)
Albumin: 3.2 g/dL — ABNORMAL LOW (ref 3.5–5.0)
Anion gap: 11 (ref 5–15)
BUN: 24 mg/dL — ABNORMAL HIGH (ref 6–20)
BUN: 31 mg/dL — ABNORMAL HIGH (ref 6–20)
CALCIUM: 8.7 mg/dL — AB (ref 8.9–10.3)
CHLORIDE: 95 mmol/L — AB (ref 98–111)
CO2: 25 mmol/L (ref 22–32)
CO2: 25 mmol/L (ref 22–32)
Calcium: 8.8 mg/dL — ABNORMAL LOW (ref 8.9–10.3)
Chloride: 98 mmol/L (ref 98–111)
Creatinine, Ser: 2.96 mg/dL — ABNORMAL HIGH (ref 0.61–1.24)
Creatinine, Ser: 4.05 mg/dL — ABNORMAL HIGH (ref 0.61–1.24)
GFR calc Af Amer: 25 mL/min — ABNORMAL LOW (ref >60)
GFR calc non Af Amer: 22 mL/min — ABNORMAL LOW (ref >60)
GFR calc non Af Amer: 15 mL/min — ABNORMAL LOW (ref >60)
GFR, EST AFRICAN AMERICAN: 17 mL/min — AB (ref >60)
GLUCOSE: 108 mg/dL — AB (ref 70–99)
Glucose, Bld: 114 mg/dL — ABNORMAL HIGH (ref 70–99)
PHOSPHORUS: 3.3 mg/dL (ref 2.5–4.6)
POTASSIUM: 4.5 mmol/L (ref 3.5–5.1)
POTASSIUM: 4.8 mmol/L (ref 3.5–5.1)
Phosphorus: 2.9 mg/dL (ref 2.5–4.6)
SODIUM: 134 mmol/L — AB (ref 135–145)
SODIUM: 134 mmol/L — AB (ref 135–145)

## 2018-04-12 LAB — CBC WITH DIFFERENTIAL/PLATELET
BASOS PCT: 0 %
Basophils Absolute: 0 10*3/uL (ref 0.0–0.1)
EOS PCT: 0 %
Eosinophils Absolute: 0 10*3/uL (ref 0.0–0.7)
HEMATOCRIT: 41.1 % (ref 39.0–52.0)
HEMOGLOBIN: 12.3 g/dL — AB (ref 13.0–17.0)
LYMPHS PCT: 5 %
Lymphs Abs: 1.3 10*3/uL (ref 0.7–4.0)
MCH: 29.6 pg (ref 26.0–34.0)
MCHC: 29.9 g/dL — AB (ref 30.0–36.0)
MCV: 98.8 fL (ref 78.0–100.0)
MONOS PCT: 12 %
Monocytes Absolute: 3 10*3/uL — ABNORMAL HIGH (ref 0.1–1.0)
NEUTROS ABS: 20.9 10*3/uL — AB (ref 1.7–7.7)
Neutrophils Relative %: 83 %
Platelets: 273 10*3/uL (ref 150–400)
RBC: 4.16 MIL/uL — ABNORMAL LOW (ref 4.22–5.81)
RDW: 14.9 % (ref 11.5–15.5)
WBC: 25.2 10*3/uL — ABNORMAL HIGH (ref 4.0–10.5)

## 2018-04-12 LAB — MAGNESIUM: MAGNESIUM: 2.8 mg/dL — AB (ref 1.7–2.4)

## 2018-04-12 SURGERY — RIGHT HEART CATH
Anesthesia: LOCAL

## 2018-04-12 MED ORDER — MIDODRINE HCL 5 MG PO TABS
15.0000 mg | ORAL_TABLET | Freq: Three times a day (TID) | ORAL | Status: DC
  Administered 2018-04-12 – 2018-04-19 (×21): 15 mg via ORAL
  Filled 2018-04-12 (×21): qty 3

## 2018-04-12 MED ORDER — AMIODARONE HCL 200 MG PO TABS
200.0000 mg | ORAL_TABLET | Freq: Two times a day (BID) | ORAL | Status: DC
  Administered 2018-04-12 – 2018-04-23 (×23): 200 mg via ORAL
  Filled 2018-04-12 (×23): qty 1

## 2018-04-12 NOTE — Progress Notes (Signed)
Patient ID: Eric Murillo, male   DOB: February 05, 1961, 57 y.o.   MRN: [id number]     Advanced Heart Failure Rounding Note  PCP-Cardiologist: [name], MD   Subjective:    Presented to [facility] ED after a syncopal episode. 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 Transferred to [facility] and had another episode of VT in ED that required ATP.   9/18 VT arrest. Amio drip restarted. Started CVVHD.  Requiring norepinephrine at 10 for CVVH.  Currently 150 cc/hr UF, CVP 13.   WBCs remain elevated at 25, no fever.  CXR yesterday without PNA.   He is lethargic.  Denies lightheadedness or dyspnea.   RHC/LHC 04/01/2018  1. Low, but not markedly low, cardiac output.  2. Pulmonary venous hypertension.  3. Markedly elevated right and left heart filling pressures.   4. No significant coronary disease.   RHC Procedural Findings: Hemodynamics (mmHg) RA mean 18 RV 65/17 PA 69/30, mean 47 PCWP mean 36 LV 100/27 AO 97/65 Oxygen saturations: PA 52% AO 94% Cardiac Output (Fick) 4.98  Cardiac Index (Fick) 2.12 PVR 2.2 WU Cardiac Output (Thermo) 4.85 Cardiac Index (Thermo) 2.06  PVR 2.3 WU   Objective:   Weight Range: 124.1 kg Body mass index is 40.4 kg/m.   Vital Signs:   Temp:  [95.8 F (35.4 C)-97.4 F (36.3 C)] 96.5 F (35.8 C) (09/20 0313) Pulse Rate:  [61-155] 76 (09/20 0800) Resp:  [11-30] 23 (09/20 0800) BP: (77-129)/(42-96) 112/61 (09/20 0800) SpO2:  [85 %-100 %] 92 % (09/20 0800) Last BM Date: 04/07/18  Weight change: Filed Weights   04/09/18 0715 04/09/18 1050 04/10/18 0500  Weight: 124.2 kg 123.3 kg 124.1 kg    Intake/Output:   Intake/Output Summary (Last 24 hours) at 04/12/2018 0942 Last data filed at 04/12/2018 0800 Gross per 24 hour  Intake 1667.13 ml  Output 4942 ml  Net -3274.87 ml      Physical Exam   General: NAD, fatigued Neck: JVP 10-12, no thyromegaly or thyroid nodule.  Lungs: Clear to auscultation bilaterally with normal  respiratory effort. CV: Nondisplaced PMI.  Heart regular S1/S2, no S3/S4, no murmur.  1+ ankle edema.   Abdomen: Soft, nontender, no hepatosplenomegaly, no distention.  Skin: Intact without lesions or rashes.  Neurologic: Alert and oriented x 3.  Psych: Normal affect. Extremities: No clubbing or cyanosis.  HEENT: Normal.    Telemetry   NSR in 80s with long PR interval  Labs    CBC Recent Labs    04/11/18 0415 04/12/18 0313  WBC 21.4* 25.2*  NEUTROABS 17.0* 20.9*  HGB 11.6* 12.3*  HCT 39.8 41.1  MCV 100.5* 98.8  PLT 285 273   Basic Metabolic Panel Recent Labs    40/98/11 0415  04/11/18 1545 04/12/18 0313  NA  --    < > 133* 134*  K  --    < > 4.6 4.5  CL  --    < > 94* 95*  CO2  --    < > 23 25  GLUCOSE  --    < > 126* 108*  BUN  --    < > 25* 24*  CREATININE  --    < > 3.14* 2.96*  CALCIUM  --    < > 8.9 8.8*  MG 2.6*  --   --  2.8*  PHOS  --    < > 3.3 2.9   < > = values in this interval not displayed.   Liver Function  Tests Recent Labs    04/11/18 1545 04/12/18 0313  ALBUMIN 3.8 3.7   No results for input(s): LIPASE, AMYLASE in the last 72 hours. Cardiac Enzymes No results for input(s): CKTOTAL, CKMB, CKMBINDEX, TROPONINI in the last 72 hours.  BNP: BNP (last 3 results) Recent Labs    07/26/17 0916 11/03/17 1238 11/13/17 0400  BNP 399.7* 1,160.1* 1,085.3*    ProBNP (last 3 results) No results for input(s): PROBNP in the last 8760 hours.   D-Dimer No results for input(s): DDIMER in the last 72 hours. Hemoglobin A1C No results for input(s): HGBA1C in the last 72 hours. Fasting Lipid Panel No results for input(s): CHOL, HDL, LDLCALC, TRIG, CHOLHDL, LDLDIRECT in the last 72 hours. Thyroid Function Tests No results for input(s): TSH, T4TOTAL, T3FREE, THYROIDAB in the last 72 hours.  Invalid input(s): FREET3  Other results:   Imaging    No results found.   Medications:     Scheduled Medications: . allopurinol  100 mg Oral BID    . amiodarone  200 mg Oral BID  . apixaban  5 mg Oral BID  . calcitRIOL  0.5 mcg Oral Q T,Th,Sa-HD  . calcium acetate  1,334 mg Oral TID WC  . Chlorhexidine Gluconate Cloth  6 each Topical Daily  . mexiletine  150 mg Oral Q12H  . midodrine  15 mg Oral TID WC  . multivitamin  1 tablet Oral QHS  . ranolazine  500 mg Oral BID  . sodium chloride flush  10-40 mL Intracatheter Q12H  . sodium chloride flush  3 mL Intravenous Q12H  . sodium chloride flush  3 mL Intravenous Q12H    Infusions: . sodium chloride    . norepinephrine (LEVOPHED) Adult infusion 10 mcg/min (04/12/18 0800)  . dialysis replacement fluid (prismasate) 400 mL/hr at 04/12/18 0234  . dialysis replacement fluid (prismasate) 200 mL/hr at 04/12/18 0233  . dialysate (PRISMASATE) 2,000 mL/hr at 04/12/18 0845  . sodium chloride      PRN Medications: sodium chloride, acetaminophen, alteplase, bismuth subsalicylate, heparin, HYDROcodone-acetaminophen, ondansetron (ZOFRAN) IV, promethazine, senna-docusate, sodium chloride, sodium chloride flush, sodium chloride flush    Patient Profile   Eric Murillo is a 57 y.o. male with history of morbid obesity, hyperlipidemia, HTN, atrial flutter (s/p TEE DC-CV 04/03/14 and again in 8/17), NICM with chronic systolic HF and severe OSA but unable to tolerate CPAP.    Assessment/Plan   1. Recurrent VT requiring shock and ATP: Follows with Dr Graciela Husbands. EP has been consulted. Last saw Dr Graciela Husbands 04/03/18 and was having recurrent episodes of VF. Mexilitine was added in addition to his amio.  Coronary angiography this admission showed no CAD. Cardiac output was low but not markedly so.  Concern for VT as sign of end stage cardiomyopathy.  - Can transition to po amiodarone today.  - Continue mexiletine.  2. Chronic systolic heart failure: Nonischemic cardiomyopathy, likely related to HTN.  Echo in 8/14 with EF 45% but EF down to 25-30% on TEE while in atrial flutter (03/2014).  Echo (1/16) with EF ~25%  and echo in 6/16 with EF 30-35%.  No cardiac MRI done with elevated creatinine and size. There was concern for cardiac amyloidosis.  However, negative SPEP and abdominal fat pad biopsy negative. Low voltage on ECG may be due to obesity and not amyloidosis. Echo 10/2017: EF 20-25%, mild to mod MR. S/P St Jude ICD.  Marked volume overload by RHC this admission.  Unable get fluid off with HD due to hypotension,  now on CVVH and norepinephrine.  Good UF, currently 150 cc/hr.  CVP remains 13.  - Continue CVVH today until volume status optimized, then will have to try to get him back to intermittent HD.  Will have to titrate off norepinephrine eventually.  I will increase midodrine today to 10 mg tid.  - He has end stage HF, not candidate for LVAD and unlikely to be able to get to heart/kidney transplant.  Palliative care following, so far patient wants to be full code.  If we are unable to transition back to stable intermittent HD without hypotension, we will not have further options for him.  His family understands this though I am unclear how well he understands the situation.  3. Atypical Atrial Flutter: Recurrent.  S/p several DCCVs, most recently 11/21/17. Has been seen by EP. Not felt to be amenable to ablation. He is in NSR with long PR interval.  - on Eliqus 5 mg twice a day.  4. ESRD: Now on HD T/R/Sat.  Intolerant iHD earlier this admission, hypotensive when trying to pull off excess fluid.  Now on CVVH with norepinephrine.  As above, titrating up midodrine and will have to come off norepinephrine to have any further success with intermittent HD.  5. OSA: - Has been unable to tolerate CPAP 6. Morbid Obesity:  - Body mass index is 40.4 kg/m.  7. ID: WBCs 25, afebrile.  CXR yesterday without infiltrates.  - Blood cultures, sputum cultures, will repeat CXR.    Palliative Care following  Length of Stay: 8  [name], MD  04/12/2018, 9:42 AM  Advanced Heart Failure Team Pager [phone] (M-F; 7a -  4p)  Please contact [facility] for night-coverage after hours (4p -7a ) and weekends on [email or web address]

## 2018-04-12 NOTE — Progress Notes (Signed)
Palliative: [name] is resting in bed.  He is on continuous hemodialysis.  Lab is at bedside attempting to obtain blood cultures.  They are having difficulty due to right arm restriction.   [name], again today, appears exhausted.  His eyes are open, and he will make but not keep eye contact when requested.  He appears acutely ill.  He tells me he has no needs at this time.  Limited conversation due to his acute illness.  Conversation with [name] and [name] outside the room.  We talked about the plan to remove fluid, discontinue CVVHD, likely tomorrow.  I shared that at that point, [name] must be able to tolerate regular hemodialysis without the use of vasopressors.  I share that he looks very tired to me. Family states that they feel he is better today.  They share that he got some sleep, and ate some food.  I shared that we would like to see meaningful improvements, I worry that this is waxing and waning.  I shared that his body and God's will.  Sister states that it has always been in God's hands.  Conference with Dr. Shirlee Latch related to plan of care, patient condition. Conference with nursing staff related to plan of care, patient needs. 35 minutes Lillia Carmel, NP Palliative medicine team [phone]

## 2018-04-12 NOTE — Progress Notes (Signed)
Subjective: Interval History: up in chair, tired.  3L net UF yest on CRRT.  Objective: Vital signs in last 24 hours: Temp:  [95.8 F (35.4 C)-97.4 F (36.3 C)] 96.5 F (35.8 C) (09/20 0313) Pulse Rate:  [61-155] 76 (09/20 0800) Resp:  [11-28] 23 (09/20 0800) BP: (77-129)/(43-96) 112/61 (09/20 0800) SpO2:  [85 %-100 %] 92 % (09/20 0800) Weight change:   Intake/Output from previous day: 09/19 0701 - 09/20 0700 In: 1834 [P.O.:420; I.V.:1414] Out: 5056  Intake/Output this shift: Total I/O In: 54.2 [I.V.:54.2] Out: 196 [Other:196]  General appearance: alert, cooperative Resp: diminished breath sounds bilaterally Cardio: S1, S2 normal and systolic murmur: holosystolic 2/6, blowing at apex GI: markedly obese, pos bs, soft Extremities: edema 1+ LE RUA AVF+bruit    CXR 9/14 mild pvc  HD: [facility]/ TTS 4.5h  122.5 kg  2/2 bath Hep none  RUA AVF - calc 0.5 ug tiw - venofer 7/10 completed 100 mg qhd   Assessment/Plan: 1 ESRD HD TTS. On CRRT to attempt vol removal due to [place] pressures noted at [facility]. Defer to cardiology as to timing of stopping CRRT.  2  Hyperkalemia - resolved 3 Obesity 4 HPTH meds 5 VT storm - per cardiology , on IV amio/ po mexilitene 6 HTN - on pressors now 7 Anemia stable Hb 11   Vinson Moselle MD [employer] pgr [phone]   04/12/2018, 10:55 AM       Lab Results: Recent Labs    04/11/18 0415 04/12/18 0313  WBC 21.4* 25.2*  HGB 11.6* 12.3*  HCT 39.8 41.1  PLT 285 273   BMET:  Recent Labs    04/11/18 1545 04/12/18 0313  NA 133* 134*  K 4.6 4.5  CL 94* 95*  CO2 23 25  GLUCOSE 126* 108*  BUN 25* 24*  CREATININE 3.14* 2.96*  CALCIUM 8.9 8.8*   No results for input(s): PTH in the last 72 hours. Iron Studies: No results for input(s): IRON, TIBC, TRANSFERRIN, FERRITIN in the last 72 hours.  Studies/Results: Dg Chest Port 1 View  Result Date: 04/12/2018 CLINICAL DATA:  Decreased breath sounds EXAM:  PORTABLE CHEST 1 VIEW COMPARISON:  04/11/2018 FINDINGS: Cardiac shadow remains enlarged. Defibrillator is again noted. Left jugular temporary dialysis catheter is seen. The lungs are well aerated bilaterally with mild right basilar atelectasis stable from the previous exam. Elevation of the right hemidiaphragm is again noted and stable. No bony abnormality is seen. IMPRESSION: Mild right basilar atelectasis stable from the prior exam. Electronically Signed   By: Alcide Clever M.D.   On: 04/12/2018 10:02   Dg Chest Port 1 View  Result Date: 04/11/2018 CLINICAL DATA:  Shortness of breath. History of CHF, cardiac dysrhythmias, end-stage renal disease, morbid obesity. EXAM: PORTABLE CHEST 1 VIEW COMPARISON:  Portable chest x-ray of April 10, 2018 FINDINGS: The right lung is mildly hypoinflated secondary to an elevated hemidiaphragm which is a chronic finding. The left lung is better inflated. The cardiac silhouette is enlarged. The pulmonary vascularity is not engorged. The ICD is in stable position. The dual-lumen dialysis catheter tip projects over the midportion of the SVC. IMPRESSION: Mild chronic hypoinflation on the right. No alveolar pneumonia nor pulmonary edema. Stable enlargement of cardiac silhouette without significant pulmonary vascular congestion. Electronically Signed   By: David  [place] M.D.   On: 04/11/2018 10:24   Dg Chest Port 1 View  Result Date: 04/10/2018 CLINICAL DATA:  Central line placement. EXAM: PORTABLE CHEST 1 VIEW COMPARISON:  04/06/2018 and Apr 17, 2018 FINDINGS: Left jugular vein catheter has been inserted. The tip is at the cavoatrial junction in good position. No pneumothorax. Chronic mild cardiomegaly. AICD in place. Pulmonary vascularity is normal. Chronic elevation of the right hemidiaphragm. No discrete infiltrates or effusions. IMPRESSION: No acute abnormalities.  New central line appears in good position. Electronically Signed   By: Francene Boyers M.D.   On: 04/10/2018  15:07    I have reviewed the patient's current medications.

## 2018-04-12 NOTE — Progress Notes (Signed)
Progress Note  Patient Name: Eric Murillo Date of Encounter: 04/12/2018  Primary Cardiologist: [name], MD  Electrophysiologist; Dr. Graciela Husbands  Subjective    CVVH drawing fluid and BP supported by NE  NO VT x > 48 H  nauseated Inpatient Medications    Scheduled Meds: . allopurinol  100 mg Oral BID  . amiodarone  150 mg Intravenous Once  . apixaban  5 mg Oral BID  . calcitRIOL  0.5 mcg Oral Q T,Th,Sa-HD  . calcium acetate  1,334 mg Oral TID WC  . Chlorhexidine Gluconate Cloth  6 each Topical Daily  . [START ON 04/13/2018] darbepoetin (ARANESP) injection - DIALYSIS  150 mcg Intravenous Q Sat-HD  . mexiletine  150 mg Oral Q12H  . midodrine  10 mg Oral TID WC  . multivitamin  1 tablet Oral QHS  . ranolazine  500 mg Oral BID  . sodium chloride flush  10-40 mL Intracatheter Q12H  . sodium chloride flush  3 mL Intravenous Q12H  . sodium chloride flush  3 mL Intravenous Q12H   Continuous Infusions: . sodium chloride    . amiodarone 30 mg/hr (04/12/18 0800)  . norepinephrine (LEVOPHED) Adult infusion 10 mcg/min (04/12/18 0800)  . dialysis replacement fluid (prismasate) 400 mL/hr at 04/12/18 0234  . dialysis replacement fluid (prismasate) 200 mL/hr at 04/12/18 0233  . dialysate (PRISMASATE) 2,000 mL/hr at 04/12/18 0845  . sodium chloride     PRN Meds: sodium chloride, acetaminophen, alteplase, bismuth subsalicylate, heparin, HYDROcodone-acetaminophen, ondansetron (ZOFRAN) IV, promethazine, senna-docusate, sodium chloride, sodium chloride flush, sodium chloride flush   Vital Signs    Vitals:   04/12/18 0630 04/12/18 0645 04/12/18 0700 04/12/18 0800  BP: (!) 93/57 (!) 97/51 (!) 97/57 112/61  Pulse: 67 67 66 76  Resp: (!) 21 20 19  (!) 23  Temp:      TempSrc:      SpO2: 92% 92% 90% 92%  Weight:      Height:        Intake/Output Summary (Last 24 hours) at 04/12/2018 0846 Last data filed at 04/12/2018 0800 Gross per 24 hour  Intake 1667.13 ml  Output 5013 ml  Net  -3345.87 ml   Filed Weights   04/09/18 0715 04/09/18 1050 04/10/18 0500  Weight: 124.2 kg 123.3 kg 124.1 kg    Telemetry    NO VT  Personally Reviewed  ECG       Physical Exam  Well developed and nourished in no acute distress HENT normal Neck supple with JVP-flat Clear Regular rate and rhythm, no murmurs or gallops Abd-soft with active BS No Clubbing cyanosis tr edema Skin-warm and dry A & Oriented  Grossly normal sensory and motor function    Labs    Chemistry Recent Labs  Lab 04/11/18 0524 04/11/18 1545 04/12/18 0313  NA 135 133* 134*  K 6.1* 4.6 4.5  CL 94* 94* 95*  CO2 16* 23 25  GLUCOSE 85 126* 108*  BUN 43* 25* 24*  CREATININE 6.18* 3.14* 2.96*  CALCIUM 9.2 8.9 8.8*  ALBUMIN 3.5 3.8 3.7  GFRNONAA 9* 20* 22*  GFRAA 10* 24* 25*  ANIONGAP 25* 16* 14     Hematology Recent Labs  Lab 04/10/18 0617 04/11/18 0415 04/12/18 0313  WBC 12.9* 21.4* 25.2*  RBC 3.74* 3.96* 4.16*  HGB 11.0* 11.6* 12.3*  HCT 36.2* 39.8 41.1  MCV 96.8 100.5* 98.8  MCH 29.4 29.3 29.6  MCHC 30.4 29.1* 29.9*  RDW 14.4 14.7 14.9  PLT 233 285  273    Cardiac EnzymesNo results for input(s): TROPONINI in the last 168 hours.  No results for input(s): TROPIPOC in the last 168 hours.   BNPNo results for input(s): BNP, PROBNP in the last 168 hours.   DDimer No results for input(s): DDIMER in the last 168 hours.   Radiology    Dg Chest Port 1 View  Result Date: 04/11/2018 CLINICAL DATA:  Shortness of breath. History of CHF, cardiac dysrhythmias, end-stage renal disease, morbid obesity. EXAM: PORTABLE CHEST 1 VIEW COMPARISON:  Portable chest x-ray of April 10, 2018 FINDINGS: The right lung is mildly hypoinflated secondary to an elevated hemidiaphragm which is a chronic finding. The left lung is better inflated. The cardiac silhouette is enlarged. The pulmonary vascularity is not engorged. The ICD is in stable position. The dual-lumen dialysis catheter tip projects over the  midportion of the SVC. IMPRESSION: Mild chronic hypoinflation on the right. No alveolar pneumonia nor pulmonary edema. Stable enlargement of cardiac silhouette without significant pulmonary vascular congestion. Electronically Signed   By: David  [place] M.D.   On: 04/11/2018 10:24   Dg Chest Port 1 View  Result Date: 04/10/2018 CLINICAL DATA:  Central line placement. EXAM: PORTABLE CHEST 1 VIEW COMPARISON:  04/06/2018 and May 04, 2018 FINDINGS: Left jugular vein catheter has been inserted. The tip is at the cavoatrial junction in good position. No pneumothorax. Chronic mild cardiomegaly. AICD in place. Pulmonary vascularity is normal. Chronic elevation of the right hemidiaphragm. No discrete infiltrates or effusions. IMPRESSION: No acute abnormalities.  New central line appears in good position. Electronically Signed   By: Francene Boyers M.D.   On: 04/10/2018 15:07    Cardiac Studies   11/21/17: TEE Study Conclusions - Left ventricle: The cavity size was mildly dilated. Wall thickness was normal. Systolic function was severely reduced. The estimated ejection fraction was in the range of 25% to 30%. Diffuse hypokinesis. - Aortic valve: There was no stenosis. - Aorta: The ascending aorta was normal in caliber. - Mitral valve: There was mild to moderate regurgitation. - Left atrium: The atrium was moderately dilated. No evidence of thrombus in the atrial cavity or appendage. - Right ventricle: The cavity size was normal. Pacer wire or catheter noted in right ventricle. Systolic function was mildly reduced. - Right atrium: HD catheter noted in SVC. The atrium was mildly dilated. - Atrial septum: No ASD or PFO by color doppler. Impressions: - May proceed to DCCV.  09/02/14: Myoview (unable to open result)  Patient Profile     57 y.o. male with a hx of HTN, HLD, PAFlutter (he was seen by Dr. Elberta Fortis to evaluate for ablation, felt not to be a candidate with 2 different arrhythmias,  a severely dilated LA, mod dilated RA, and recommended ongoing amiodarone tx, NICM, chronic CHF (systolic), severe OSA (untreated, unable to tolerate CPAP), morbid obesity, ESRF on HD, transferred from [facility] to [facility] May 04, 2018 after a syncopal event.    He was unable to be dialyzed yesterday (unable to gain access) with plans to return the following day.  Later while at his girlfriend's house had a syncopal event >> [facility] via EMS  Device information: SJM single chamber ICD, implanted 05/21/15 AAD tx: amiodarone, appears initially for his Atrial arrhythmias + hx of appropriate therapies Interrogation: battery and lead measurements are OK VT episodes 4 failed APps, one shock successful ATP >> another morphology, accelerated as well, 4th accelerated futher to a 3rd morphology >> shock  admitted 04/04/13 lidocaine gtt, PO amiodarone 04/06/18 Sustained VT,  failed ATP > shock 04/06/18 amiodarone gtt started, mexiletine started 04/07/18 lidocaine gtt stopped  Assessment & Plan    1. VT storm  2. 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 Chronic CHF acute (systolic) 4. ESRF on HD 5. HTN     Relative hypotension 6. Untreated OSA 7. Morbid obesity Nausea     Continue mex ranolazine and amio Transition to PO over weekend Hopefully with CVVHD will improve electromechanical stress and VTG will settle down,  I share Dr DM lack of sanguinity    Signed, Sherryl Manges, MD  04/12/2018, 8:46 AM

## 2018-04-13 LAB — RENAL FUNCTION PANEL
ALBUMIN: 3.4 g/dL — AB (ref 3.5–5.0)
ANION GAP: 13 (ref 5–15)
BUN: 29 mg/dL — AB (ref 6–20)
CALCIUM: 8.7 mg/dL — AB (ref 8.9–10.3)
CO2: 25 mmol/L (ref 22–32)
Chloride: 96 mmol/L — ABNORMAL LOW (ref 98–111)
Creatinine, Ser: 3.59 mg/dL — ABNORMAL HIGH (ref 0.61–1.24)
GFR calc Af Amer: 20 mL/min — ABNORMAL LOW (ref >60)
GFR calc non Af Amer: 17 mL/min — ABNORMAL LOW (ref >60)
GLUCOSE: 73 mg/dL (ref 70–99)
Phosphorus: 3.2 mg/dL (ref 2.5–4.6)
Potassium: 5.5 mmol/L — ABNORMAL HIGH (ref 3.5–5.1)
SODIUM: 134 mmol/L — AB (ref 135–145)

## 2018-04-13 LAB — CBC WITH DIFFERENTIAL/PLATELET
ABS IMMATURE GRANULOCYTES: 0.4 10*3/uL — AB (ref 0.0–0.1)
BASOS ABS: 0 10*3/uL (ref 0.0–0.1)
BASOS PCT: 0 %
EOS ABS: 0.1 10*3/uL (ref 0.0–0.7)
Eosinophils Relative: 0 %
HCT: 37.7 % — ABNORMAL LOW (ref 39.0–52.0)
Hemoglobin: 11.6 g/dL — ABNORMAL LOW (ref 13.0–17.0)
IMMATURE GRANULOCYTES: 2 %
Lymphocytes Relative: 6 %
Lymphs Abs: 1.2 10*3/uL (ref 0.7–4.0)
MCH: 29.7 pg (ref 26.0–34.0)
MCHC: 30.8 g/dL (ref 30.0–36.0)
MCV: 96.4 fL (ref 78.0–100.0)
Monocytes Absolute: 1.9 10*3/uL — ABNORMAL HIGH (ref 0.1–1.0)
Monocytes Relative: 9 %
NEUTROS ABS: 17.6 10*3/uL — AB (ref 1.7–7.7)
NEUTROS PCT: 83 %
PLATELETS: 231 10*3/uL (ref 150–400)
RBC: 3.91 MIL/uL — AB (ref 4.22–5.81)
RDW: 15.9 % — AB (ref 11.5–15.5)
WBC: 21.2 10*3/uL — AB (ref 4.0–10.5)

## 2018-04-13 LAB — APTT: APTT: 41 s — AB (ref 24–36)

## 2018-04-13 LAB — MAGNESIUM: MAGNESIUM: 3 mg/dL — AB (ref 1.7–2.4)

## 2018-04-13 MED ORDER — SORBITOL 70 % SOLN
30.0000 mL | Freq: Every day | Status: DC | PRN
  Administered 2018-04-13: 30 mL via ORAL
  Filled 2018-04-13: qty 30

## 2018-04-13 NOTE — Progress Notes (Signed)
Patient ID: Eric Murillo, male   DOB: February 09, 1961, 57 y.o.   MRN: [id number]     Advanced Heart Failure Rounding Note  PCP-Cardiologist: [name], MD   Subjective:    Presented to [facility] ED after a syncopal episode. 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 Transferred to [facility] and had another episode of VT in ED that required ATP.   9/18 VT arrest. Amio drip restarted. On CVVHD negative 3.5 L in last 24 hours.  Norepi weaned off this am. Midodrine increased to 15 TID.   Currently CVVHD @ -150 cc/hr UF, CVP 14.   WBCs 25-> 21K afebrile   RHC/LHC 04/07/2018  1. Low, but not markedly low, cardiac output.  2. Pulmonary venous hypertension.  3. Markedly elevated right and left heart filling pressures.   4. No significant coronary disease.   RHC Procedural Findings: Hemodynamics (mmHg) RA mean 18 RV 65/17 PA 69/30, mean 47 PCWP mean 36 LV 100/27 AO 97/65 Oxygen saturations: PA 52% AO 94% Cardiac Output (Fick) 4.98  Cardiac Index (Fick) 2.12 PVR 2.2 WU Cardiac Output (Thermo) 4.85 Cardiac Index (Thermo) 2.06  PVR 2.3 WU   Objective:   Weight Range: 117.3 kg Body mass index is 38.19 kg/m.   Vital Signs:   Temp:  [96.1 F (35.6 C)-98.1 F (36.7 C)] 98.1 F (36.7 C) (09/21 0736) Pulse Rate:  [59-90] 68 (09/21 1000) Resp:  [17-35] 18 (09/21 1000) BP: (61-146)/(36-104) 141/79 (09/21 1000) SpO2:  [91 %-100 %] 100 % (09/21 1000) Weight:  [117.3 kg] 117.3 kg (09/21 0730) Last BM Date: 04/07/18  Weight change: Filed Weights   04/10/18 0500 04/13/18 0730  Weight: 124.1 kg 117.3 kg    Intake/Output:   Intake/Output Summary (Last 24 hours) at 04/13/2018 1116 Last data filed at 04/13/2018 1100 Gross per 24 hour  Intake 1110.68 ml  Output 4253 ml  Net -3142.32 ml      Physical Exam   General: NAD, fatigued Neck: JVP 10-12, no thyromegaly or thyroid nodule.  Lungs: Clear to auscultation bilaterally with normal respiratory effort. CV:  Nondisplaced PMI.  Heart regular S1/S2, no S3/S4, no murmur.  1+ ankle edema.   Abdomen: Soft, nontender, no hepatosplenomegaly, no distention.  Skin: Intact without lesions or rashes.  Neurologic: Alert and oriented x 3.  Psych: Normal affect. Extremities: No clubbing or cyanosis.  HEENT: Normal.    Telemetry   NSR in 80s with long PR interval  Labs    CBC Recent Labs    04/12/18 0313 04/13/18 0435  WBC 25.2* 21.2*  NEUTROABS 20.9* 17.6*  HGB 12.3* 11.6*  HCT 41.1 37.7*  MCV 98.8 96.4  PLT 273 231   Basic Metabolic Panel Recent Labs    87/56/43 0313 04/12/18 1547 04/13/18 0833  NA 134* 134* 134*  K 4.5 4.8 5.5*  CL 95* 98 96*  CO2 25 25 25   GLUCOSE 108* 114* 73  BUN 24* 31* 29*  CREATININE 2.96* 4.05* 3.59*  CALCIUM 8.8* 8.7* 8.7*  MG 2.8*  --  3.0*  PHOS 2.9 3.3 3.2   Liver Function Tests Recent Labs    04/12/18 1547 04/13/18 0833  ALBUMIN 3.2* 3.4*   No results for input(s): LIPASE, AMYLASE in the last 72 hours. Cardiac Enzymes No results for input(s): CKTOTAL, CKMB, CKMBINDEX, TROPONINI in the last 72 hours.  BNP: BNP (last 3 results) Recent Labs    07/26/17 0916 11/03/17 1238 11/13/17 0400  BNP 399.7* 1,160.1* 1,085.3*    ProBNP (last  3 results) No results for input(s): PROBNP in the last 8760 hours.   D-Dimer No results for input(s): DDIMER in the last 72 hours. Hemoglobin A1C No results for input(s): HGBA1C in the last 72 hours. Fasting Lipid Panel No results for input(s): CHOL, HDL, LDLCALC, TRIG, CHOLHDL, LDLDIRECT in the last 72 hours. Thyroid Function Tests No results for input(s): TSH, T4TOTAL, T3FREE, THYROIDAB in the last 72 hours.  Invalid input(s): FREET3  Other results:   Imaging    No results found.   Medications:     Scheduled Medications: . allopurinol  100 mg Oral BID  . amiodarone  200 mg Oral BID  . apixaban  5 mg Oral BID  . calcitRIOL  0.5 mcg Oral Q T,Th,Sa-HD  . calcium acetate  1,334 mg Oral  TID WC  . Chlorhexidine Gluconate Cloth  6 each Topical Daily  . mexiletine  150 mg Oral Q12H  . midodrine  15 mg Oral TID WC  . multivitamin  1 tablet Oral QHS  . ranolazine  500 mg Oral BID  . sodium chloride flush  10-40 mL Intracatheter Q12H  . sodium chloride flush  3 mL Intravenous Q12H  . sodium chloride flush  3 mL Intravenous Q12H    Infusions: . sodium chloride    . norepinephrine (LEVOPHED) Adult infusion Stopped (04/13/18 1001)  . dialysis replacement fluid (prismasate) 400 mL/hr at 04/13/18 0104  . dialysis replacement fluid (prismasate) 200 mL/hr at 04/13/18 0109  . dialysate (PRISMASATE) 2,000 mL/hr at 04/13/18 1114  . sodium chloride      PRN Medications: sodium chloride, acetaminophen, alteplase, bismuth subsalicylate, heparin, HYDROcodone-acetaminophen, ondansetron (ZOFRAN) IV, promethazine, senna-docusate, sodium chloride, sodium chloride flush, sodium chloride flush    Patient Profile   Ettore Trebilcock is a 57 y.o. male with history of morbid obesity, hyperlipidemia, HTN, atrial flutter (s/p TEE DC-CV 04/03/14 and again in 8/17), NICM with chronic systolic HF and severe OSA but unable to tolerate CPAP.    Assessment/Plan   1. Recurrent VT requiring shock and ATP: Follows with Dr Graciela Husbands. EP has been consulted. Last saw Dr Graciela Husbands 04/03/18 and was having recurrent episodes of VF. Mexilitine was added in addition to his amio.  Coronary angiography this admission showed no CAD. Cardiac output was low but not markedly so.  Concern for VT as sign of end stage cardiomyopathy.  - On po amiodarone, mexilitene and Ranexa. Need to be cautious of Ranexa in setting of ESRD.  2. Chronic systolic heart failure: Nonischemic cardiomyopathy, likely related to HTN.  Echo in 8/14 with EF 45% but EF down to 25-30% on TEE while in atrial flutter (03/2014).  Echo (1/16) with EF ~25% and echo in 6/16 with EF 30-35%.  No cardiac MRI done with elevated creatinine and size. There was concern for  cardiac amyloidosis.  However, negative SPEP and abdominal fat pad biopsy negative. Low voltage on ECG may be due to obesity and not amyloidosis. Echo 10/2017: EF 20-25%, mild to mod MR. S/P [employer] Jude ICD.  Marked volume overload by RHC this admission.  Unable get fluid off with HD due to hypotension, now on CVVH and norepinephrine.  Good UF, currently -150 cc/hr.  CVP remains 14.  - Continue CVVH at least through weekend until volume status optimized, then will have to try to get him back to intermittent HD early next week as tolerates. NE off. Continue midodrine 15 tid. - He has end stage HF, not candidate for LVAD and unlikely to be able  to get to heart/kidney transplant.  Palliative care following, so far patient wants to be full code.  If we are unable to transition back to stable intermittent HD without hypotension, we will not have further options for him.  His family understands this though we are unclear how well he understands the situation.  3. Atypical Atrial Flutter: Recurrent.  S/p several DCCVs, most recently 11/21/17. Has been seen by EP. Not felt to be amenable to ablation. He is in NSR with long PR interval.  - on Eliqus 5 mg twice a day.  4. ESRD: Now on HD T/R/Sat.  Intolerant iHD earlier this admission, hypotensive when trying to pull off excess fluid and had VT.  Now on CVVHD.  As above, titrating up midodrine and now off norepinephrine.  5. OSA: - Has been unable to tolerate CPAP 6. Morbid Obesity:  - Body mass index is 40.4 kg/m.  7. ID: WBCs 25 -> 21K, afebrile.  CXR yesterday without infiltrates.  - Blood cultures, sputum cultures, will repeat CXR.     Palliative Care following  CRITICAL CARE Performed by: Arvilla Meres  Total critical care time: 35 minutes  Critical care time was exclusive of separately billable procedures and treating other patients.  Critical care was necessary to treat or prevent imminent or life-threatening deterioration.  Critical care was  time spent personally by me (independent of midlevel providers or residents) on the following activities: development of treatment plan with patient and/or surrogate as well as nursing, discussions with consultants, evaluation of patient's response to treatment, examination of patient, obtaining history from patient or surrogate, ordering and performing treatments and interventions, ordering and review of laboratory studies, ordering and review of radiographic studies, pulse oximetry and re-evaluation of patient's condition.    Length of Stay: 9  Arvilla Meres, MD  04/13/2018, 11:16 AM  Advanced Heart Failure Team Pager [phone] (M-F; 7a - 4p)  Please contact [facility] for night-coverage after hours (4p -7a ) and weekends on [email or web address]

## 2018-04-13 NOTE — Progress Notes (Signed)
Subjective: Interval History: up in chair, looks a little better.  Neg 3.5 L off w CRRT last 24 hrs. wts down 117kg.  Objective: Vital signs in last 24 hours: Temp:  [96.1 F (35.6 C)-98.1 F (36.7 C)] 98.1 F (36.7 C) (09/21 0736) Pulse Rate:  [59-90] 68 (09/21 1000) Resp:  [17-35] 18 (09/21 1000) BP: (61-146)/(21-104) 141/79 (09/21 1000) SpO2:  [91 %-100 %] 100 % (09/21 1000) Weight:  [117.3 kg] 117.3 kg (09/21 0730) Weight change:   Intake/Output from previous day: 09/20 0701 - 09/21 0700 In: 896.1 [P.O.:360; I.V.:536.1] Out: 4435  Intake/Output this shift: Total I/O In: 374.7 [P.O.:340; I.V.:34.7] Out: 604 [Other:604]  General appearance: alert, cooperative Resp: diminished breath sounds bilaterally Cardio: S1, S2 normal and systolic murmur: holosystolic 2/6, blowing at apex GI: markedly obese, pos bs, soft Extremities: edema 1+ LE RUA AVF+bruit    CXR 9/14 mild pvc  HD: [facility]/ TTS 4.5h  122.5 kg  2/2 bath Hep none  RUA AVF - calc 0.5 ug tiw - venofer 7/10 completed 100 mg qhd   Assessment/Plan: 1 ESRD HD TTS.  Did not tolerate HD (due to VT) and unable to get much fluid off w regular HD as well, now on CRRT w/ successful UF ~3L net UF per day x 2 days. Wt down 5kg under prior dry wt today. CVP not accurate.  2  Hyperkalemia - resolved 3 Obesity 4 HPTH meds 5 VT storm - per cardiology , on IV amio/ po mexilitene 6 HTN - on pressors now 7 Anemia stable Hb 11   Eric Moselle MD [employer] pgr [phone]   04/13/2018, 10:25 AM       Lab Results: Recent Labs    04/12/18 0313 04/13/18 0435  WBC 25.2* 21.2*  HGB 12.3* 11.6*  HCT 41.1 37.7*  PLT 273 231   BMET:  Recent Labs    04/12/18 1547 04/13/18 0833  NA 134* 134*  K 4.8 5.5*  CL 98 96*  CO2 25 25  GLUCOSE 114* 73  BUN 31* 29*  CREATININE 4.05* 3.59*  CALCIUM 8.7* 8.7*   No results for input(s): PTH in the last 72 hours. Iron Studies: No results for  input(s): IRON, TIBC, TRANSFERRIN, FERRITIN in the last 72 hours.  Studies/Results: Dg Chest Port 1 View  Result Date: 04/12/2018 CLINICAL DATA:  Decreased breath sounds EXAM: PORTABLE CHEST 1 VIEW COMPARISON:  04/11/2018 FINDINGS: Cardiac shadow remains enlarged. Defibrillator is again noted. Left jugular temporary dialysis catheter is seen. The lungs are well aerated bilaterally with mild right basilar atelectasis stable from the previous exam. Elevation of the right hemidiaphragm is again noted and stable. No bony abnormality is seen. IMPRESSION: Mild right basilar atelectasis stable from the prior exam. Electronically Signed   By: Alcide Clever M.D.   On: 04/12/2018 10:02    I have reviewed the patient's current medications.

## 2018-04-14 LAB — RENAL FUNCTION PANEL
ALBUMIN: 3.3 g/dL — AB (ref 3.5–5.0)
ANION GAP: 12 (ref 5–15)
BUN: 17 mg/dL (ref 6–20)
CO2: 26 mmol/L (ref 22–32)
Calcium: 8.8 mg/dL — ABNORMAL LOW (ref 8.9–10.3)
Chloride: 98 mmol/L (ref 98–111)
Creatinine, Ser: 2.18 mg/dL — ABNORMAL HIGH (ref 0.61–1.24)
GFR calc Af Amer: 37 mL/min — ABNORMAL LOW (ref >60)
GFR, EST NON AFRICAN AMERICAN: 32 mL/min — AB (ref >60)
Glucose, Bld: 92 mg/dL (ref 70–99)
PHOSPHORUS: 1.7 mg/dL — AB (ref 2.5–4.6)
Potassium: 4.3 mmol/L (ref 3.5–5.1)
Sodium: 136 mmol/L (ref 135–145)

## 2018-04-14 LAB — APTT: aPTT: 41 seconds — ABNORMAL HIGH (ref 24–36)

## 2018-04-14 LAB — CBC WITH DIFFERENTIAL/PLATELET
ABS IMMATURE GRANULOCYTES: 0.4 10*3/uL — AB (ref 0.0–0.1)
BASOS PCT: 0 %
Basophils Absolute: 0 10*3/uL (ref 0.0–0.1)
EOS PCT: 0 %
Eosinophils Absolute: 0.1 10*3/uL (ref 0.0–0.7)
HCT: 41.2 % (ref 39.0–52.0)
HEMOGLOBIN: 12.4 g/dL — AB (ref 13.0–17.0)
Immature Granulocytes: 2 %
LYMPHS PCT: 9 %
Lymphs Abs: 1.7 10*3/uL (ref 0.7–4.0)
MCH: 29.7 pg (ref 26.0–34.0)
MCHC: 30.1 g/dL (ref 30.0–36.0)
MCV: 98.6 fL (ref 78.0–100.0)
MONO ABS: 2.6 10*3/uL — AB (ref 0.1–1.0)
MONOS PCT: 13 %
Neutro Abs: 15.1 10*3/uL — ABNORMAL HIGH (ref 1.7–7.7)
Neutrophils Relative %: 76 %
Platelets: 245 10*3/uL (ref 150–400)
RBC: 4.18 MIL/uL — ABNORMAL LOW (ref 4.22–5.81)
RDW: 15.7 % — ABNORMAL HIGH (ref 11.5–15.5)
WBC: 19.8 10*3/uL — ABNORMAL HIGH (ref 4.0–10.5)

## 2018-04-14 LAB — MAGNESIUM: Magnesium: 2.9 mg/dL — ABNORMAL HIGH (ref 1.7–2.4)

## 2018-04-14 NOTE — Progress Notes (Addendum)
Subjective: Interval History: up in chair, -3.5 L yest off net, wts' down to 115kg.    Objective: Vital signs in last 24 hours: Temp:  [96.6 F (35.9 C)-98.1 F (36.7 C)] 98.1 F (36.7 C) (09/22 0738) Pulse Rate:  [61-143] 79 (09/22 0800) Resp:  [15-31] 20 (09/22 0800) BP: (88-141)/(38-93) 119/64 (09/22 0800) SpO2:  [86 %-100 %] 86 % (09/22 0800) Weight:  [114.9 kg] 114.9 kg (09/22 0700) Weight change:   Intake/Output from previous day: 09/21 0701 - 09/22 0700 In: 1034.8 [P.O.:1000; I.V.:34.8] Out: 4592  Intake/Output this shift: Total I/O In: -  Out: 193 [Other:193]  General appearance: alert, cooperative Resp: diminished breath sounds bilaterally Cardio: S1, S2 normal and systolic murmur: holosystolic 2/6, blowing at apex GI: markedly obese, pos bs, soft Extremities: edema 1+ LE RUA AVF+bruit    CXR 9/14 mild pvc  HD: [facility]/ TTS 4.5h  122.5 kg  2/2 bath Hep none  RUA AVF - calc 0.5 ug tiw - venofer 7/10 completed 100 mg qhd   Assessment/Plan: 1 ESRD HD TTS.  Did not tolerate HD well (VT) and unable to get much fluid off w regular HD.  Filling pressures very high on R heart cath. CRRT started on 9/18. Tolerating 3-4 L net UF per day well and off of pressors.  Wt down 8kg under prior dry wt.  Have d/w cards, will plan one more day of CRRT then retry regular HD on Tuesday.   2  Hyperkalemia - resolved 3 Obesity 4 HPTH meds 5 NICM EF 25%/ VT storm - per cardiology , on IV amio/ po mexilitene 6 HTN - on pressors now 7 Anemia stable Hb 11   Vinson Moselle MD [employer] pgr [phone]   04/14/2018, 9:40 AM       Lab Results: Recent Labs    04/13/18 0435 04/14/18 0413  WBC 21.2* 19.8*  HGB 11.6* 12.4*  HCT 37.7* 41.2  PLT 231 245   BMET:  Recent Labs    04/13/18 0833 04/14/18 0637  NA 134* 136  K 5.5* 4.3  CL 96* 98  CO2 25 26  GLUCOSE 73 92  BUN 29* 17  CREATININE 3.59* 2.18*  CALCIUM 8.7* 8.8*   No results for  input(s): PTH in the last 72 hours. Iron Studies: No results for input(s): IRON, TIBC, TRANSFERRIN, FERRITIN in the last 72 hours.  Studies/Results: Dg Chest Port 1 View  Result Date: 04/12/2018 CLINICAL DATA:  Decreased breath sounds EXAM: PORTABLE CHEST 1 VIEW COMPARISON:  04/11/2018 FINDINGS: Cardiac shadow remains enlarged. Defibrillator is again noted. Left jugular temporary dialysis catheter is seen. The lungs are well aerated bilaterally with mild right basilar atelectasis stable from the previous exam. Elevation of the right hemidiaphragm is again noted and stable. No bony abnormality is seen. IMPRESSION: Mild right basilar atelectasis stable from the prior exam. Electronically Signed   By: Alcide Clever M.D.   On: 04/12/2018 10:02    I have reviewed the patient's current medications.

## 2018-04-14 NOTE — Progress Notes (Signed)
Patient ID: Eric Murillo, male   DOB: 25-Oct-1960, 57 y.o.   MRN: [id number]     Advanced Heart Failure Rounding Note  PCP-Cardiologist: [name], MD   Subjective:    Presented to [facility] ED after a syncopal episode. 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 Transferred to [facility] and had another episode of VT in ED that required ATP.   9/18 VT arrest. Amio drip restarted but now back to po.   Now off norepinephrine and on midodrine 15 mg tid.   Currently CVVHD @ -125 cc/hr UF, CVP 10.   WBCs 25-> 21 -> 19.8 afebrile   Fatigued, no dyspnea.   RHC/LHC 03/29/2018  1. Low, but not markedly low, cardiac output.  2. Pulmonary venous hypertension.  3. Markedly elevated right and left heart filling pressures.   4. No significant coronary disease.   RHC Procedural Findings: Hemodynamics (mmHg) RA mean 18 RV 65/17 PA 69/30, mean 47 PCWP mean 36 LV 100/27 AO 97/65 Oxygen saturations: PA 52% AO 94% Cardiac Output (Fick) 4.98  Cardiac Index (Fick) 2.12 PVR 2.2 WU Cardiac Output (Thermo) 4.85 Cardiac Index (Thermo) 2.06  PVR 2.3 WU   Objective:   Weight Range: 114.9 kg Body mass index is 37.41 kg/m.   Vital Signs:   Temp:  [96.6 F (35.9 C)-98.1 F (36.7 C)] 98.1 F (36.7 C) (09/22 0738) Pulse Rate:  [61-143] 76 (09/22 0730) Resp:  [15-31] 18 (09/22 0730) BP: (88-141)/(38-93) 124/76 (09/22 0730) SpO2:  [90 %-100 %] 97 % (09/22 0730) Weight:  [114.9 kg] 114.9 kg (09/22 0700) Last BM Date: 04/07/18  Weight change: Filed Weights   04/13/18 0730 04/14/18 0700  Weight: 117.3 kg 114.9 kg    Intake/Output:   Intake/Output Summary (Last 24 hours) at 04/14/2018 0757 Last data filed at 04/14/2018 0700 Gross per 24 hour  Intake 914.8 ml  Output 4592 ml  Net -3677.2 ml      Physical Exam   General: NAD Neck: JVP 10 cm, no thyromegaly or thyroid nodule.  Lungs: Clear to auscultation bilaterally with normal respiratory effort. CV:  Nonpalpable PMI.  Heart regular S1/S2, no S3/S4, no murmur.  Trace ankle edema.   Abdomen: Soft, nontender, no hepatosplenomegaly, no distention.  Skin: Intact without lesions or rashes.  Neurologic: Alert and oriented x 3.  Psych: Normal affect. Extremities: No clubbing or cyanosis.  HEENT: Normal.    Telemetry   NSR in 70s with long PR interval (personally reviewed)  Labs    CBC Recent Labs    04/13/18 0435 04/14/18 0413  WBC 21.2* 19.8*  NEUTROABS 17.6* 15.1*  HGB 11.6* 12.4*  HCT 37.7* 41.2  MCV 96.4 98.6  PLT 231 245   Basic Metabolic Panel Recent Labs    32/44/01 1547 04/13/18 0833 04/14/18 0413  NA 134* 134*  --   K 4.8 5.5*  --   CL 98 96*  --   CO2 25 25  --   GLUCOSE 114* 73  --   BUN 31* 29*  --   CREATININE 4.05* 3.59*  --   CALCIUM 8.7* 8.7*  --   MG  --  3.0* 2.9*  PHOS 3.3 3.2  --    Liver Function Tests Recent Labs    04/12/18 1547 04/13/18 0833  ALBUMIN 3.2* 3.4*   No results for input(s): LIPASE, AMYLASE in the last 72 hours. Cardiac Enzymes No results for input(s): CKTOTAL, CKMB, CKMBINDEX, TROPONINI in the last 72 hours.  BNP: BNP (last  3 results) Recent Labs    07/26/17 0916 11/03/17 1238 11/13/17 0400  BNP 399.7* 1,160.1* 1,085.3*    ProBNP (last 3 results) No results for input(s): PROBNP in the last 8760 hours.   D-Dimer No results for input(s): DDIMER in the last 72 hours. Hemoglobin A1C No results for input(s): HGBA1C in the last 72 hours. Fasting Lipid Panel No results for input(s): CHOL, HDL, LDLCALC, TRIG, CHOLHDL, LDLDIRECT in the last 72 hours. Thyroid Function Tests No results for input(s): TSH, T4TOTAL, T3FREE, THYROIDAB in the last 72 hours.  Invalid input(s): FREET3  Other results:   Imaging    No results found.   Medications:     Scheduled Medications: . allopurinol  100 mg Oral BID  . amiodarone  200 mg Oral BID  . apixaban  5 mg Oral BID  . calcitRIOL  0.5 mcg Oral Q T,Th,Sa-HD  .  calcium acetate  1,334 mg Oral TID WC  . Chlorhexidine Gluconate Cloth  6 each Topical Daily  . mexiletine  150 mg Oral Q12H  . midodrine  15 mg Oral TID WC  . multivitamin  1 tablet Oral QHS  . ranolazine  500 mg Oral BID  . sodium chloride flush  10-40 mL Intracatheter Q12H  . sodium chloride flush  3 mL Intravenous Q12H  . sodium chloride flush  3 mL Intravenous Q12H    Infusions: . sodium chloride    . norepinephrine (LEVOPHED) Adult infusion Stopped (04/13/18 1001)  . dialysis replacement fluid (prismasate) 400 mL/hr at 04/14/18 0130  . dialysis replacement fluid (prismasate) 200 mL/hr at 04/13/18 0109  . dialysate (PRISMASATE) 2,000 mL/hr at 04/14/18 0753  . sodium chloride      PRN Medications: sodium chloride, acetaminophen, alteplase, bismuth subsalicylate, heparin, HYDROcodone-acetaminophen, ondansetron (ZOFRAN) IV, promethazine, senna-docusate, sodium chloride, sodium chloride flush, sodium chloride flush, sorbitol    Patient Profile   Eric Murillo is a 57 y.o. male with history of morbid obesity, hyperlipidemia, HTN, atrial flutter (s/p TEE DC-CV 04/03/14 and again in 8/17), NICM with chronic systolic HF and severe OSA but unable to tolerate CPAP.    Assessment/Plan   1. Recurrent VT requiring shock and ATP: Follows with Dr Graciela Husbands. EP has been consulted. Last saw Dr Graciela Husbands 04/03/18 and was having recurrent episodes of VF. Mexilitine was added in addition to his amio.  Coronary angiography this admission showed no CAD. Cardiac output was low but not markedly so.  Concern for VT as sign of end stage cardiomyopathy.  No VT overnight.  - On po amiodarone, mexilitene and Ranexa. Need to be cautious of Ranexa in setting of ESRD (will discuss with Dr. Graciela Husbands).  2. Chronic systolic heart failure: Nonischemic cardiomyopathy, likely related to HTN.  Echo in 8/14 with EF 45% but EF down to 25-30% on TEE while in atrial flutter (03/2014).  Echo (1/16) with EF ~25% and echo in 6/16 with  EF 30-35%.  No cardiac MRI done with elevated creatinine and size. There was concern for cardiac amyloidosis.  However, negative SPEP and abdominal fat pad biopsy negative. Low voltage on ECG may be due to obesity and not amyloidosis. Echo 10/2017: EF 20-25%, mild to mod MR. S/P St Jude ICD.  Marked volume overload by RHC this admission.  Unable get fluid off with HD due to hypotension, now on CVVH.  Good UF, currently -125 cc/hr.  CVP 10.  He is now off norepinephrine and on midodrine 15 mg tid.  - Continue CVVH likely until tomorrow.  Will then have to try to get him back to intermittent HD early next week as tolerated. Continue midodrine 15 tid. - He has end stage HF, not candidate for LVAD and unlikely to be able to get to heart/kidney transplant. Palliative care following, so far patient wants to be full code.  If we are unable to transition back to stable intermittent HD without hypotension, we will not have further options for him.  His family understands this though we are unclear how well he understands the situation.  3. Atypical Atrial Flutter: Recurrent.  S/p several DCCVs, most recently 11/21/17. Has been seen by EP. Not felt to be amenable to ablation. He is in NSR with long PR interval.  - on Eliqus 5 mg twice a day.  4. ESRD: Now on HD T/R/Sat.  Intolerant iHD earlier this admission, hypotensive when trying to pull off excess fluid and had VT.  Now on CVVHD.  As above, now off norepinephrine.  Will need to try to transition back to iHD early this week.   5. OSA: Has been unable to tolerate CPAP 6. Morbid Obesity:  Body mass index is 40.4 kg/m.  7. ID: WBCs 25 -> 21K -> 19K, afebrile.  CXR  without infiltrates, cultures negative.   Palliative Care following  CRITICAL CARE Performed by: [name]  Total critical care time: 35 minutes  Critical care time was exclusive of separately billable procedures and treating other patients.  Critical care was necessary to treat or prevent  imminent or life-threatening deterioration.  Critical care was time spent personally by me (independent of midlevel providers or residents) on the following activities: development of treatment plan with patient and/or surrogate as well as nursing, discussions with consultants, evaluation of patient's response to treatment, examination of patient, obtaining history from patient or surrogate, ordering and performing treatments and interventions, ordering and review of laboratory studies, ordering and review of radiographic studies, pulse oximetry and re-evaluation of patient's condition.   Length of Stay: 10  [name], MD  04/14/2018, 7:57 AM  Advanced Heart Failure Team Pager [phone] (M-F; 7a - 4p)  Please contact [facility] for night-coverage after hours (4p -7a ) and weekends on [email or web address]

## 2018-04-15 ENCOUNTER — Other Ambulatory Visit: Payer: Self-pay | Admitting: Internal Medicine

## 2018-04-15 LAB — RENAL FUNCTION PANEL
ANION GAP: 11 (ref 5–15)
Albumin: 3.2 g/dL — ABNORMAL LOW (ref 3.5–5.0)
BUN: 16 mg/dL (ref 6–20)
CO2: 26 mmol/L (ref 22–32)
Calcium: 8.7 mg/dL — ABNORMAL LOW (ref 8.9–10.3)
Chloride: 97 mmol/L — ABNORMAL LOW (ref 98–111)
Creatinine, Ser: 1.98 mg/dL — ABNORMAL HIGH (ref 0.61–1.24)
GFR calc non Af Amer: 36 mL/min — ABNORMAL LOW (ref >60)
GFR, EST AFRICAN AMERICAN: 41 mL/min — AB (ref >60)
GLUCOSE: 92 mg/dL (ref 70–99)
POTASSIUM: 4.1 mmol/L (ref 3.5–5.1)
Phosphorus: 1.7 mg/dL — ABNORMAL LOW (ref 2.5–4.6)
SODIUM: 134 mmol/L — AB (ref 135–145)

## 2018-04-15 LAB — MAGNESIUM: Magnesium: 3 mg/dL — ABNORMAL HIGH (ref 1.7–2.4)

## 2018-04-15 LAB — APTT: APTT: 38 s — AB (ref 24–36)

## 2018-04-15 MED ORDER — CHLORHEXIDINE GLUCONATE CLOTH 2 % EX PADS: 6.0000 | MEDICATED_PAD | Freq: Every day | CUTANEOUS | Status: DC

## 2018-04-15 NOTE — Progress Notes (Signed)
[facility] NEPHROLOGY PROGRESS NOTE  Assessment/ Plan: Pt is a 57 y.o. yo male ESRD on HD TTS, with syncopal episode, recurrent VT requiring shock.  HD: [facility]/ TTS 4.5h  122.5 kg  2/2 bath Hep none  RUA AVF - calc 0.5 ug tiw - venofer 7/10 completed 100 mg qhd  #Recurrent VT, chronic systolic heart failure with EF of 25 to 30%, nonischemic cardiomyopathy: Filling pressures very high on right heart cath.  Currently on amiodarone, mexiletine.  He has no chest pain or shortness of breath today.  Cardiology following  # ESRD: HD TTS.  Patient did not tolerate intermittent hemodialysis due to V. tach and not able to get much fluid off with regular dialysis.  He required CRRT since 9/18.  He is now negative by 15 L.  Plan to discontinue CRRT today.  Try intermittent hemo-tomorrow.  # Anemia: Hemoglobin 12.4.   # Secondary hyperparathyroidism: Phosphorus 1.7.  Discontinue PhosLo.  # HTN/volume: On midodrine 15 mg 3 times daily. Off  Pressure.  Subjective: Seen and examined at bedside.  Doing well on CRRT.  Denies chest pain, shortness of breath, nausea vomiting.  His sister at bedside. Objective Vital signs in last 24 hours: Vitals:   04/15/18 0600 04/15/18 0630 04/15/18 0700 04/15/18 0800  BP: (!) 117/56 (!) 102/55 (!) 103/56 (!) 142/84  Pulse: 67 64 64   Resp: 13 17 16 15   Temp:      TempSrc:      SpO2: 100% 95% 96%   Weight:   111.7 kg   Height:       Weight change: -5.6 kg  Intake/Output Summary (Last 24 hours) at 04/15/2018 0843 Last data filed at 04/15/2018 0800 Gross per 24 hour  Intake 850 ml  Output 3587 ml  Net -2737 ml       Labs: Basic Metabolic Panel: Recent Labs  Lab 04/13/18 0833 04/14/18 0637 04/15/18 0328  NA 134* 136 134*  K 5.5* 4.3 4.1  CL 96* 98 97*  CO2 25 26 26   GLUCOSE 73 92 92  BUN 29* 17 16  CREATININE 3.59* 2.18* 1.98*  CALCIUM 8.7* 8.8* 8.7*  PHOS 3.2 1.7* 1.7*   Liver Function Tests: Recent Labs  Lab  04/13/18 0833 04/14/18 0637 04/15/18 0328  ALBUMIN 3.4* 3.3* 3.2*   No results for input(s): LIPASE, AMYLASE in the last 168 hours. No results for input(s): AMMONIA in the last 168 hours. CBC: Recent Labs  Lab 04/10/18 0617 04/11/18 0415 04/12/18 0313 04/13/18 0435 04/14/18 0413  WBC 12.9* 21.4* 25.2* 21.2* 19.8*  NEUTROABS 9.0* 17.0* 20.9* 17.6* 15.1*  HGB 11.0* 11.6* 12.3* 11.6* 12.4*  HCT 36.2* 39.8 41.1 37.7* 41.2  MCV 96.8 100.5* 98.8 96.4 98.6  PLT 233 285 273 231 245   Cardiac Enzymes: No results for input(s): CKTOTAL, CKMB, CKMBINDEX, TROPONINI in the last 168 hours. CBG: No results for input(s): GLUCAP in the last 168 hours.  Iron Studies: No results for input(s): IRON, TIBC, TRANSFERRIN, FERRITIN in the last 72 hours. Studies/Results: No results found.  Medications: Infusions: . sodium chloride    . norepinephrine (LEVOPHED) Adult infusion Stopped (04/13/18 1001)  . sodium chloride      Scheduled Medications: . allopurinol  100 mg Oral BID  . amiodarone  200 mg Oral BID  . apixaban  5 mg Oral BID  . calcitRIOL  0.5 mcg Oral Q T,Th,Sa-HD  . calcium acetate  1,334 mg Oral TID WC  . Chlorhexidine Gluconate Cloth  6 each Topical Daily  . mexiletine  150 mg Oral Q12H  . midodrine  15 mg Oral TID WC  . multivitamin  1 tablet Oral QHS  . ranolazine  500 mg Oral BID  . sodium chloride flush  10-40 mL Intracatheter Q12H  . sodium chloride flush  3 mL Intravenous Q12H  . sodium chloride flush  3 mL Intravenous Q12H    have reviewed scheduled and prn medications.  Physical Exam: General:NAD, comfortable Heart:RRR, s1s2 nl Lungs:clear b/l, no cracjle Abdomen:soft, Non-tender, non-distended Extremities:No edema Dialysis Access: Right upper extremity aVF.  Eric Murillo 04/15/2018,8:43 AM  LOS: 11 days

## 2018-04-15 NOTE — Progress Notes (Signed)
Patient ID: Eric Murillo, male   DOB: 05/31/1961, 57 y.o.   MRN: [id number]     Advanced Heart Failure Rounding Note  PCP-Cardiologist: [name], MD   Subjective:    Presented to [facility] ED after a syncopal episode. 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 Transferred to [facility] and had another episode of VT in ED that required ATP.   9/18 VT arrest. Amio drip restarted but now back to po.   Now off norepinephrine and on midodrine 15 mg tid.   Currently CVVHD @ -150 cc/hr UF, CVP 10. Weight down another 7 lbs.   Very weak but no dyspnea.  Has more of an appetite and not nauseated.   RHC/LHC 04/07/2018  1. Low, but not markedly low, cardiac output.  2. Pulmonary venous hypertension.  3. Markedly elevated right and left heart filling pressures.   4. No significant coronary disease.   RHC Procedural Findings: Hemodynamics (mmHg) RA mean 18 RV 65/17 PA 69/30, mean 47 PCWP mean 36 LV 100/27 AO 97/65 Oxygen saturations: PA 52% AO 94% Cardiac Output (Fick) 4.98  Cardiac Index (Fick) 2.12 PVR 2.2 WU Cardiac Output (Thermo) 4.85 Cardiac Index (Thermo) 2.06  PVR 2.3 WU   Objective:   Weight Range: 111.7 kg Body mass index is 36.37 kg/m.   Vital Signs:   Temp:  [97.4 F (36.3 C)-98.4 F (36.9 C)] 97.4 F (36.3 C) (09/23 0400) Pulse Rate:  [57-79] 64 (09/23 0700) Resp:  [13-32] 16 (09/23 0700) BP: (86-126)/(48-70) 103/56 (09/23 0700) SpO2:  [86 %-100 %] 96 % (09/23 0700) Weight:  [111.7 kg] 111.7 kg (09/23 0700) Last BM Date: 04/07/18  Weight change: Filed Weights   04/13/18 0730 04/14/18 0700 04/15/18 0700  Weight: 117.3 kg 114.9 kg 111.7 kg    Intake/Output:   Intake/Output Summary (Last 24 hours) at 04/15/2018 0741 Last data filed at 04/15/2018 0700 Gross per 24 hour  Intake 1026 ml  Output 3780 ml  Net -2754 ml      Physical Exam   General: NAD Neck: JVP 8-9 cm, no thyromegaly or thyroid nodule.  Lungs: Clear to  auscultation bilaterally with normal respiratory effort. CV: Lateral PMI.  Heart regular S1/S2, no S3/S4, no murmur.  No peripheral edema.    Abdomen: Soft, nontender, no hepatosplenomegaly, no distention.  Skin: Intact without lesions or rashes.  Neurologic: Alert and oriented x 3.  Psych: Normal affect. Extremities: No clubbing or cyanosis.  HEENT: Normal.    Telemetry   NSR in 70s with long PR interval (personally reviewed)  Labs    CBC Recent Labs    04/13/18 0435 04/14/18 0413  WBC 21.2* 19.8*  NEUTROABS 17.6* 15.1*  HGB 11.6* 12.4*  HCT 37.7* 41.2  MCV 96.4 98.6  PLT 231 245   Basic Metabolic Panel Recent Labs    08/65/78 0413 04/14/18 0637 04/15/18 0328  NA  --  136 134*  K  --  4.3 4.1  CL  --  98 97*  CO2  --  26 26  GLUCOSE  --  92 92  BUN  --  17 16  CREATININE  --  2.18* 1.98*  CALCIUM  --  8.8* 8.7*  MG 2.9*  --  3.0*  PHOS  --  1.7* 1.7*   Liver Function Tests Recent Labs    04/14/18 0637 04/15/18 0328  ALBUMIN 3.3* 3.2*   No results for input(s): LIPASE, AMYLASE in the last 72 hours. Cardiac Enzymes No results for input(s):  CKTOTAL, CKMB, CKMBINDEX, TROPONINI in the last 72 hours.  BNP: BNP (last 3 results) Recent Labs    07/26/17 0916 11/03/17 1238 11/13/17 0400  BNP 399.7* 1,160.1* 1,085.3*    ProBNP (last 3 results) No results for input(s): PROBNP in the last 8760 hours.   D-Dimer No results for input(s): DDIMER in the last 72 hours. Hemoglobin A1C No results for input(s): HGBA1C in the last 72 hours. Fasting Lipid Panel No results for input(s): CHOL, HDL, LDLCALC, TRIG, CHOLHDL, LDLDIRECT in the last 72 hours. Thyroid Function Tests No results for input(s): TSH, T4TOTAL, T3FREE, THYROIDAB in the last 72 hours.  Invalid input(s): FREET3  Other results:   Imaging    No results found.   Medications:     Scheduled Medications: . allopurinol  100 mg Oral BID  . amiodarone  200 mg Oral BID  . apixaban  5 mg  Oral BID  . calcitRIOL  0.5 mcg Oral Q T,Th,Sa-HD  . calcium acetate  1,334 mg Oral TID WC  . Chlorhexidine Gluconate Cloth  6 each Topical Daily  . mexiletine  150 mg Oral Q12H  . midodrine  15 mg Oral TID WC  . multivitamin  1 tablet Oral QHS  . ranolazine  500 mg Oral BID  . sodium chloride flush  10-40 mL Intracatheter Q12H  . sodium chloride flush  3 mL Intravenous Q12H  . sodium chloride flush  3 mL Intravenous Q12H    Infusions: . sodium chloride    . norepinephrine (LEVOPHED) Adult infusion Stopped (04/13/18 1001)  . dialysis replacement fluid (prismasate) 400 mL/hr at 04/15/18 0440  . dialysis replacement fluid (prismasate) 200 mL/hr at 04/14/18 2047  . dialysate (PRISMASATE) 2,000 mL/hr at 04/15/18 0724  . sodium chloride      PRN Medications: sodium chloride, acetaminophen, alteplase, bismuth subsalicylate, heparin, HYDROcodone-acetaminophen, ondansetron (ZOFRAN) IV, promethazine, senna-docusate, sodium chloride, sodium chloride flush, sodium chloride flush, sorbitol    Patient Profile   Eric Murillo is a 57 y.o. male with history of morbid obesity, hyperlipidemia, HTN, atrial flutter (s/p TEE DC-CV 04/03/14 and again in 8/17), NICM with chronic systolic HF and severe OSA but unable to tolerate CPAP.    Assessment/Plan   1. Recurrent VT requiring shock and ATP: Follows with Dr Graciela Husbands. EP has been consulted. Last saw Dr Graciela Husbands 04/03/18 and was having recurrent episodes of VF. Mexilitine was added in addition to his amio.  Coronary angiography this admission showed no CAD. Cardiac output was low but not markedly so.  Concern for VT as sign of end stage cardiomyopathy.  No VT overnight.  - On po amiodarone, mexilitene and Ranexa. Need to be cautious of Ranexa in setting of ESRD (will discuss with Dr. Graciela Husbands).  2. Chronic systolic heart failure: Nonischemic cardiomyopathy, likely related to HTN.  Echo in 8/14 with EF 45% but EF down to 25-30% on TEE while in atrial flutter  (03/2014).  Echo (1/16) with EF ~25% and echo in 6/16 with EF 30-35%.  No cardiac MRI done with elevated creatinine and size. There was concern for cardiac amyloidosis.  However, negative SPEP and abdominal fat pad biopsy negative. Low voltage on ECG may be due to obesity and not amyloidosis. Echo 10/2017: EF 20-25%, mild to mod MR. S/P St Jude ICD.  Marked volume overload by RHC this admission.  Unable get fluid off with HD initially due to hypotension, now on CVVH.  Good UF, currently -150 cc/hr.  CVP 9-10.  He is now off norepinephrine  and on midodrine 15 mg tid.  - If renal agrees, would stop CVVH today and try him on HD tomorrow. Continue midodrine 15 tid. - He has end stage HF, not candidate for LVAD and unlikely to be able to get to heart/kidney transplant. Palliative care following, so far patient wants to be full code.  If we are unable to transition back to stable intermittent HD without hypotension, we will not have further options for him.  His family understands this though we are unclear how well he understands the situation.  3. Atypical Atrial Flutter: Recurrent.  S/p several DCCVs, most recently 11/21/17. Has been seen by EP. Not felt to be amenable to ablation. He is in NSR with long PR interval.  - on Eliqus 5 mg twice a day.  4. ESRD: Now on HD T/R/Sat.  Intolerant iHD earlier this admission, hypotensive when trying to pull off excess fluid and had VT.  Now on CVVHD.  As above, now off norepinephrine.  Will need to try to transition back to iHD probably tomorrow.  5. OSA: Has been unable to tolerate CPAP 6. Morbid Obesity:  Body mass index is 40.4 kg/m.  7. ID: Afebrile.  WBCs have been elevated but no CBC today.    Palliative Care following  Length of Stay: 67  [name], MD  04/15/2018, 7:41 AM  Advanced Heart Failure Team Pager [phone] (M-F; 7a - 4p)  Please contact [facility] for night-coverage after hours (4p -7a ) and weekends on [email or web address]

## 2018-04-15 NOTE — Progress Notes (Signed)
Progress Note  Patient Name: Eric Murillo Date of Encounter: 04/15/2018  Primary Cardiologist: [name], MD  Electrophysiologist; Dr. Graciela Husbands  Subjective   feels better with less sob and no chest pain  Inpatient Medications    Scheduled Meds: . allopurinol  100 mg Oral BID  . amiodarone  200 mg Oral BID  . apixaban  5 mg Oral BID  . calcitRIOL  0.5 mcg Oral Q T,Th,Sa-HD  . Chlorhexidine Gluconate Cloth  6 each Topical Daily  . Chlorhexidine Gluconate Cloth  6 each Topical Q0600  . mexiletine  150 mg Oral Q12H  . midodrine  15 mg Oral TID WC  . multivitamin  1 tablet Oral QHS  . ranolazine  500 mg Oral BID  . sodium chloride flush  10-40 mL Intracatheter Q12H  . sodium chloride flush  3 mL Intravenous Q12H  . sodium chloride flush  3 mL Intravenous Q12H   Continuous Infusions: . sodium chloride    . norepinephrine (LEVOPHED) Adult infusion Stopped (04/13/18 1001)  . sodium chloride     PRN Meds: sodium chloride, acetaminophen, alteplase, bismuth subsalicylate, heparin, HYDROcodone-acetaminophen, ondansetron (ZOFRAN) IV, promethazine, senna-docusate, sodium chloride, sodium chloride flush, sodium chloride flush, sorbitol   Vital Signs    Vitals:   04/15/18 0600 04/15/18 0630 04/15/18 0700 04/15/18 0800  BP: (!) 117/56 (!) 102/55 (!) 103/56 (!) 142/84  Pulse: 67 64 64   Resp: 13 17 16 15   Temp:      TempSrc:      SpO2: 100% 95% 96%   Weight:   111.7 kg   Height:        Intake/Output Summary (Last 24 hours) at 04/15/2018 0915 Last data filed at 04/15/2018 0900 Gross per 24 hour  Intake 850 ml  Output 3872 ml  Net -3022 ml   Filed Weights   04/13/18 0730 04/14/18 0700 04/15/18 0700  Weight: 117.3 kg 114.9 kg 111.7 kg    Telemetry    Sinus without VT  Personally Reviewed  ECG    No new EKGs - Personally Reviewed  Physical Exam   Well developed and nourished in no acute distress HENT normal Neck supple with JVP-flat Clear Regular rate and  rhythm, no murmurs or gallops Abd-soft with active BS No Clubbing cyanosis edema Skin-warm and dry A & Oriented  Grossly normal sensory and motor function    Labs    Chemistry Recent Labs  Lab 04/13/18 0833 04/14/18 0637 04/15/18 0328  NA 134* 136 134*  K 5.5* 4.3 4.1  CL 96* 98 97*  CO2 25 26 26   GLUCOSE 73 92 92  BUN 29* 17 16  CREATININE 3.59* 2.18* 1.98*  CALCIUM 8.7* 8.8* 8.7*  ALBUMIN 3.4* 3.3* 3.2*  GFRNONAA 17* 32* 36*  GFRAA 20* 37* 41*  ANIONGAP 13 12 11      Hematology Recent Labs  Lab 04/12/18 0313 04/13/18 0435 04/14/18 0413  WBC 25.2* 21.2* 19.8*  RBC 4.16* 3.91* 4.18*  HGB 12.3* 11.6* 12.4*  HCT 41.1 37.7* 41.2  MCV 98.8 96.4 98.6  MCH 29.6 29.7 29.7  MCHC 29.9* 30.8 30.1  RDW 14.9 15.9* 15.7*  PLT 273 231 245    Cardiac EnzymesNo results for input(s): TROPONINI in the last 168 hours.  No results for input(s): TROPIPOC in the last 168 hours.   BNPNo results for input(s): BNP, PROBNP in the last 168 hours.   DDimer No results for input(s): DDIMER in the last 168 hours.   Radiology  No results found.  Cardiac Studies   11/21/17: TEE Study Conclusions - Left ventricle: The cavity size was mildly dilated. Wall thickness was normal. Systolic function was severely reduced. The estimated ejection fraction was in the range of 25% to 30%. Diffuse hypokinesis. - Aortic valve: There was no stenosis. - Aorta: The ascending aorta was normal in caliber. - Mitral valve: There was mild to moderate regurgitation. - Left atrium: The atrium was moderately dilated. No evidence of thrombus in the atrial cavity or appendage. - Right ventricle: The cavity size was normal. Pacer wire or catheter noted in right ventricle. Systolic function was mildly reduced. - Right atrium: HD catheter noted in SVC. The atrium was mildly dilated. - Atrial septum: No ASD or PFO by color doppler. Impressions: - May proceed to DCCV.  09/02/14: Myoview  (unable to open result)  Patient Profile     57 y.o. male with a hx of HTN, HLD, PAFlutter (he was seen by Dr. Elberta Fortis to evaluate for ablation, felt not to be a candidate with 2 different arrhythmias, a severely dilated LA, mod dilated RA, and recommended ongoing amiodarone tx, NICM, chronic CHF (systolic), severe OSA (untreated, unable to tolerate CPAP), morbid obesity, ESRF on HD, transferred from [facility] to [facility] Apr 21, 2018 after a syncopal event.    He was unable to be dialyzed yesterday (unable to gain access) with plans to return the following day.  Later while at his girlfriend's house had a syncopal event >> [facility] via EMS  Device information: SJM single chamber ICD, implanted 05/21/15 AAD tx: amiodarone, appears initially for his Atrial arrhythmias + hx of appropriate therapies Interrogation: battery and lead measurements are OK VT episodes 4 failed APps, one shock successful ATP >> another morphology, accelerated as well, 4th accelerated futher to a 3rd morphology >> shock  admitted 04/04/13 lidocaine gtt, PO amiodarone 04/06/18 Sustained VT, failed ATP > shock 04/06/18 amiodarone gtt started, mexiletine started 04/07/18 lidocaine gtt stopped  Assessment & Plan    1. VT storm  2. 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 Chronic CHF (systolic) 4. ESRF on HD 5. HTN     Relative hypotension 6. Untreated OSA 7. Morbid obesity Nausea    Amazing progress   NO VT x 6 days Weight down 14 Kg to terminate CVVH today and try HD tomorrow -- hopefully he will tolerate dialysis in am  Will continue mex, ranolazine and amiod for now    Signed, Sherryl Manges, MD  04/15/2018, 9:15 AM

## 2018-04-16 LAB — CBC WITH DIFFERENTIAL/PLATELET
ABS IMMATURE GRANULOCYTES: 0.4 10*3/uL — AB (ref 0.0–0.1)
Basophils Absolute: 0 10*3/uL (ref 0.0–0.1)
Basophils Relative: 0 %
Eosinophils Absolute: 0 10*3/uL (ref 0.0–0.7)
Eosinophils Relative: 0 %
HEMATOCRIT: 42.2 % (ref 39.0–52.0)
HEMOGLOBIN: 12.6 g/dL — AB (ref 13.0–17.0)
IMMATURE GRANULOCYTES: 2 %
LYMPHS ABS: 2.2 10*3/uL (ref 0.7–4.0)
LYMPHS PCT: 11 %
MCH: 29.4 pg (ref 26.0–34.0)
MCHC: 29.9 g/dL — ABNORMAL LOW (ref 30.0–36.0)
MCV: 98.4 fL (ref 78.0–100.0)
MONOS PCT: 13 %
Monocytes Absolute: 2.5 10*3/uL — ABNORMAL HIGH (ref 0.1–1.0)
NEUTROS ABS: 14.4 10*3/uL — AB (ref 1.7–7.7)
NEUTROS PCT: 74 %
PLATELETS: 246 10*3/uL (ref 150–400)
RBC: 4.29 MIL/uL (ref 4.22–5.81)
RDW: 16.6 % — ABNORMAL HIGH (ref 11.5–15.5)
WBC: 19.4 10*3/uL — ABNORMAL HIGH (ref 4.0–10.5)

## 2018-04-16 LAB — RENAL FUNCTION PANEL
ANION GAP: 15 (ref 5–15)
Albumin: 3 g/dL — ABNORMAL LOW (ref 3.5–5.0)
BUN: 46 mg/dL — ABNORMAL HIGH (ref 6–20)
CHLORIDE: 94 mmol/L — AB (ref 98–111)
CO2: 21 mmol/L — AB (ref 22–32)
Calcium: 9.3 mg/dL (ref 8.9–10.3)
Creatinine, Ser: 5.75 mg/dL — ABNORMAL HIGH (ref 0.61–1.24)
GFR calc Af Amer: 11 mL/min — ABNORMAL LOW (ref >60)
GFR calc non Af Amer: 10 mL/min — ABNORMAL LOW (ref >60)
GLUCOSE: 97 mg/dL (ref 70–99)
Phosphorus: 4.9 mg/dL — ABNORMAL HIGH (ref 2.5–4.6)
Potassium: 5.7 mmol/L — ABNORMAL HIGH (ref 3.5–5.1)
Sodium: 130 mmol/L — ABNORMAL LOW (ref 135–145)

## 2018-04-16 MED ORDER — SODIUM CHLORIDE 0.9 % IV SOLN: 100.0000 mL | INTRAVENOUS | Status: DC | PRN

## 2018-04-16 MED ORDER — ALBUMIN HUMAN 25 % IV SOLN
INTRAVENOUS | Status: AC
  Filled 2018-04-16: qty 200

## 2018-04-16 NOTE — Evaluation (Signed)
Physical Therapy Evaluation Patient Details Name: Eric Murillo MRN: [id number] DOB: 1961-05-09 Today's Date: 04/16/2018   History of Present Illness  Pt is a 57 y.o. yo male ESRD on HD TTS, with syncopal episode, recurrent VT requiring shock.  Clinical Impression  Pt was indep PTA but now presenting with generalized weakness, decreased activity tolerance, and requires min/modA x2 for all mobility. Pt amb 10' with RW, was very shaky and unable to maintain full upright position. Recommend CIR upon d/c to maximize functional return as pt is only 57yo and was completely indep and driving PTA. Acute PT to cont to follow.    Follow Up Recommendations CIR    Equipment Recommendations  None recommended by PT(TBD at next venue)    Recommendations for Other Services Rehab consult     Precautions / Restrictions Precautions Precautions: Fall Restrictions Weight Bearing Restrictions: No      Mobility  Bed Mobility Overal bed mobility: Needs Assistance Bed Mobility: Supine to Sit     Supine to sit: HOB elevated;Min assist     General bed mobility comments: max directional v/c's, minA for trunk elevation, able to move LEs off EOB with increased time, used bed rail to pull trunk up  Transfers Overall transfer level: Needs assistance Equipment used: Rolling walker (2 wheeled) Transfers: Sit to/from Stand Sit to Stand: Min assist;Mod assist;From elevated surface         General transfer comment: max verbal cues to push up from bed, scoot forward and bring feet back, increased time, min/modA to power up, shaky upon standing  Ambulation/Gait Ambulation/Gait assistance: Mod assist;+2 physical assistance;+2 safety/equipment Gait Distance (Feet): 10 Feet Assistive device: Rolling walker (2 wheeled) Gait Pattern/deviations: Step-to pattern;Decreased stride length;Trunk flexed;Narrow base of support Gait velocity: slow Gait velocity interpretation: <1.8 ft/sec, indicate of risk for  recurrent falls General Gait Details: pt very shaky, difficulty maintain upright posture, trunk and knees flexed, pt with R lateral lean as well  Stairs            Wheelchair Mobility    Modified Rankin (Stroke Patients Only)       Balance Overall balance assessment: Needs assistance Sitting-balance support: Feet supported;Bilateral upper extremity supported Sitting balance-Leahy Scale: Fair Sitting balance - Comments: fatigues quickly   Standing balance support: Bilateral upper extremity supported Standing balance-Leahy Scale: Poor Standing balance comment: dependent on RW and physical assist                             Pertinent Vitals/Pain Pain Assessment: No/denies pain(reports sore bottom from being in the bed)    Home Living Family/patient expects to be discharged to:: Inpatient rehab Living Arrangements: Spouse/significant other               Additional Comments: lives with sig other in one story home with 2 STE, tub shower. no ADs at home    Prior Function Level of Independence: Independent         Comments: no AD required, driving     Hand Dominance   Dominant Hand: Right    Extremity/Trunk Assessment   Upper Extremity Assessment Upper Extremity Assessment: Defer to OT evaluation    Lower Extremity Assessment Lower Extremity Assessment: Generalized weakness    Cervical / Trunk Assessment Cervical / Trunk Assessment: Normal  Communication   Communication: (soft spoken)  Cognition Arousal/Alertness: Awake/alert Behavior During Therapy: Flat affect(depressed mood) Overall Cognitive Status: Within Functional Limits for tasks assessed  General Comments: did appears to have some delayed processing however also extremely weak and required more time to complete task      General Comments General comments (skin integrity, edema, etc.): pt with report of sore bottom but did not see  it    Exercises     Assessment/Plan    PT Assessment Patient needs continued PT services  PT Problem List Decreased range of motion;Decreased activity tolerance;Decreased balance;Decreased strength;Decreased mobility;Decreased coordination;Decreased knowledge of use of DME;Decreased safety awareness       PT Treatment Interventions DME instruction;Gait training;Stair training;Functional mobility training;Therapeutic activities;Therapeutic exercise;Balance training;Neuromuscular re-education    PT Goals (Current goals can be found in the Care Plan section)  Acute Rehab PT Goals Patient Stated Goal: didn't state PT Goal Formulation: With patient/family Time For Goal Achievement: 04/30/18 Potential to Achieve Goals: Good    Frequency Min 4X/week   Barriers to discharge Decreased caregiver support has 24/7 but can only provide supervision    Co-evaluation PT/OT/SLP Co-Evaluation/Treatment: Yes Reason for Co-Treatment: Complexity of the patient's impairments (multi-system involvement) PT goals addressed during session: Mobility/safety with mobility         AM-PAC PT "6 Clicks" Daily Activity  Outcome Measure Difficulty turning over in bed (including adjusting bedclothes, sheets and blankets)?: Unable Difficulty moving from lying on back to sitting on the side of the bed? : Unable Difficulty sitting down on and standing up from a chair with arms (e.g., wheelchair, bedside commode, etc,.)?: Unable Help needed moving to and from a bed to chair (including a wheelchair)?: A Lot Help needed walking in hospital room?: A Lot Help needed climbing 3-5 steps with a railing? : Total 6 Click Score: 8    End of Session Equipment Utilized During Treatment: Gait belt Activity Tolerance: Patient tolerated treatment well Patient left: in chair;with call bell/phone within reach;with chair alarm set;with family/visitor present Nurse Communication: Mobility status PT Visit Diagnosis:  Unsteadiness on feet (R26.81)    Time: 1010-1048 PT Time Calculation (min) (ACUTE ONLY): 38 min   Charges:   PT Evaluation $PT Eval Moderate Complexity: 1 Mod PT Treatments $Gait Training: 8-22 mins        [name], PT, DPT Acute Rehabilitation Services Pager #: [phone] Office #: [phone]   [name] 04/16/2018, 11:20 AM

## 2018-04-16 NOTE — Progress Notes (Addendum)
Patient ID: Eric Murillo, male   DOB: Dec 04, 1960, 57 y.o.   MRN: [id number]     Advanced Heart Failure Rounding Note  PCP-Cardiologist: Eric Ancona, MD   Subjective:    Presented to [facility] ED after a syncopal episode. 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 Transferred to [facility] and had another episode of VT in ED that required ATP.   9/18 VT arrest. Amio drip restarted but now back to po.   Remains on midodrine 15 mg tid. Plan to try I-HD today.   Remains weak and tired. Appetite remains low. Denies SOB. Mild lightheadedness with standing. Nurses have tried to walk but pt is 3-4 person assist.   RHC/LHC 03/24/2018  1. Low, but not markedly low, cardiac output.  2. Pulmonary venous hypertension.  3. Markedly elevated right and left heart filling pressures.   4. No significant coronary disease.   RHC Procedural Findings: Hemodynamics (mmHg) RA mean 18 RV 65/17 PA 69/30, mean 47 PCWP mean 36 LV 100/27 AO 97/65 Oxygen saturations: PA 52% AO 94% Cardiac Output (Fick) 4.98  Cardiac Index (Fick) 2.12 PVR 2.2 WU Cardiac Output (Thermo) 4.85 Cardiac Index (Thermo) 2.06  PVR 2.3 WU   Objective:   Weight Range: 111.7 kg Body mass index is 36.37 kg/m.   Vital Signs:   Temp:  [97.5 F (36.4 C)-98.5 F (36.9 C)] 97.5 F (36.4 C) (09/24 0727) Pulse Rate:  [64-73] 66 (09/24 0400) Resp:  [14-28] 14 (09/24 0400) BP: (79-124)/(55-89) 116/89 (09/24 0400) SpO2:  [92 %-100 %] 99 % (09/24 0400) Last BM Date: 04/15/18  Weight change: Filed Weights   04/13/18 0730 04/14/18 0700 04/15/18 0700  Weight: 117.3 kg 114.9 kg 111.7 kg    Intake/Output:   Intake/Output Summary (Last 24 hours) at 04/16/2018 0835 Last data filed at 04/16/2018 0600 Gross per 24 hour  Intake 660 ml  Output 155 ml  Net 505 ml      Physical Exam   General: chronically ill appearing. NAD.  HEENT: Normal Neck: Supple. JVP ~8-9 cm+. Carotids 2+ bilat; no bruits. No  thyromegaly or nodule noted. Cor: PMI nondisplaced. RRR, No M/G/R noted Lungs: CTAB, normal effort. Abdomen: Soft, non-tender, non-distended, no HSM. No bruits or masses. +BS  Extremities: No cyanosis, clubbing, or rash. R and LLE no edema.  Neuro: Alert & orientedx3, cranial nerves grossly intact. moves all 4 extremities w/o difficulty. Affect pleasant   Telemetry   NSR 60-70s, personally reviewed.   Labs    CBC Recent Labs    04/14/18 0413 04/16/18 0417  WBC 19.8* 19.4*  NEUTROABS 15.1* 14.4*  HGB 12.4* 12.6*  HCT 41.2 42.2  MCV 98.6 98.4  PLT 245 246   Basic Metabolic Panel Recent Labs    40/98/11 0413  04/15/18 0328 04/16/18 0417  NA  --    < > 134* 130*  K  --    < > 4.1 5.7*  CL  --    < > 97* 94*  CO2  --    < > 26 21*  GLUCOSE  --    < > 92 97  BUN  --    < > 16 46*  CREATININE  --    < > 1.98* 5.75*  CALCIUM  --    < > 8.7* 9.3  MG 2.9*  --  3.0*  --   PHOS  --    < > 1.7* 4.9*   < > = values in this interval not displayed.  Liver Function Tests Recent Labs    04/15/18 0328 04/16/18 0417  ALBUMIN 3.2* 3.0*   No results for input(s): LIPASE, AMYLASE in the last 72 hours. Cardiac Enzymes No results for input(s): CKTOTAL, CKMB, CKMBINDEX, TROPONINI in the last 72 hours.  BNP: BNP (last 3 results) Recent Labs    07/26/17 0916 11/03/17 1238 11/13/17 0400  BNP 399.7* 1,160.1* 1,085.3*    ProBNP (last 3 results) No results for input(s): PROBNP in the last 8760 hours.   D-Dimer No results for input(s): DDIMER in the last 72 hours. Hemoglobin A1C No results for input(s): HGBA1C in the last 72 hours. Fasting Lipid Panel No results for input(s): CHOL, HDL, LDLCALC, TRIG, CHOLHDL, LDLDIRECT in the last 72 hours. Thyroid Function Tests No results for input(s): TSH, T4TOTAL, T3FREE, THYROIDAB in the last 72 hours.  Invalid input(s): FREET3  Other results:   Imaging    No results found.   Medications:     Scheduled Medications: .  allopurinol  100 mg Oral BID  . amiodarone  200 mg Oral BID  . apixaban  5 mg Oral BID  . calcitRIOL  0.5 mcg Oral Q T,Th,Sa-HD  . Chlorhexidine Gluconate Cloth  6 each Topical Daily  . Chlorhexidine Gluconate Cloth  6 each Topical Q0600  . mexiletine  150 mg Oral Q12H  . midodrine  15 mg Oral TID WC  . multivitamin  1 tablet Oral QHS  . ranolazine  500 mg Oral BID  . sodium chloride flush  10-40 mL Intracatheter Q12H  . sodium chloride flush  3 mL Intravenous Q12H  . sodium chloride flush  3 mL Intravenous Q12H    Infusions: . sodium chloride    . norepinephrine (LEVOPHED) Adult infusion Stopped (04/13/18 1001)    PRN Medications: sodium chloride, acetaminophen, bismuth subsalicylate, HYDROcodone-acetaminophen, ondansetron (ZOFRAN) IV, promethazine, senna-docusate, sodium chloride flush, sodium chloride flush, sorbitol    Patient Profile   Eric Murillo is a 57 y.o. male with history of morbid obesity, hyperlipidemia, HTN, atrial flutter (s/p TEE DC-CV 04/03/14 and again in 8/17), NICM with chronic systolic HF and severe OSA but unable to tolerate CPAP.    Assessment/Plan   1. Recurrent VT requiring shock and ATP: Follows with Dr Eric Murillo. EP has been consulted. Last saw Dr Eric Murillo 04/03/18 and was having recurrent episodes of VF. Mexilitine was added in addition to his amio.  Coronary angiography this admission showed no CAD. Cardiac output was low but not markedly so.  Concern for VT as sign of end stage cardiomyopathy.  No VT overnight.  - On po amiodarone, mexilitene and Ranexa. Need to be cautious of Ranexa in setting of ESRD (will discuss with Dr. Graciela Murillo).  - No further.  2. Chronic systolic heart failure: Nonischemic cardiomyopathy, likely related to HTN.  Echo in 8/14 with EF 45% but EF down to 25-30% on TEE while in atrial flutter (03/2014).  Echo (1/16) with EF ~25% and echo in 6/16 with EF 30-35%.  No cardiac MRI done with elevated creatinine and size. There was concern for  cardiac amyloidosis.  However, negative SPEP and abdominal fat pad biopsy negative. Low voltage on ECG may be due to obesity and not amyloidosis. Echo 10/2017: EF 20-25%, mild to mod MR. S/P [employer] Jude ICD.  Marked volume overload by RHC this admission.  Unable get fluid off with HD initially due to hypotension - Off CVVH. Plan for IHD today.  - Continue midodrine 15 mg tid.  - If renal agrees, would  stop CVVH today and try him on HD tomorrow. Continue midodrine 15 tid. - He has end stage HF, not candidate for LVAD and unlikely to be able to get to heart/kidney transplant. Palliative care following, so far patient wants to be full code.  If we are unable to transition back to stable intermittent HD without hypotension, we will not have further options for him.  His family understands this though we are unclear how well he understands the situation.  3. Atypical Atrial Flutter: Recurrent.  S/p several DCCVs, most recently 11/21/17. Has been seen by EP. Not felt to be amenable to ablation. He is in NSR with long PR interval.  - Continue on Eliqus 5 mg twice a day.  4. ESRD: Now on HD T/R/Sat.  Intolerant iHD earlier this admission, hypotensive when trying to pull off excess fluid and had VT.  Now on CVVHD.  As above, now off norepinephrine.   - Attempting iHD today. If does not tolerated, will need to address goals of care.  5. OSA:  - Does not tolerate CPAP 6. Morbid Obesity:   - Body mass index is 36.37 kg/m.  7. ID:  - Afebrile. WBC 19.4 this am.  8. Deconditioning - PT to see.  - RNs have tried to walk but pt is 3-4 person assist.  Length of Stay: [address]  Graciella Freer, Cordelia Poche  04/16/2018, 8:35 AM  Advanced Heart Failure Team Pager [phone] (M-F; 7a - 4p)  Please contact [facility] for night-coverage after hours (4p -7a ) and weekends on [email or web address]  Patient seen with PA, agree with the above note.   Now off CVVH.  No further VT.  Very weak.    He is going to retry intermittent HD.   Continue midodrine.   Needs PT/OT, out of bed.   Eric Murillo 04/16/2018 9:08 AM

## 2018-04-16 NOTE — Progress Notes (Signed)
Patient completed 4.5 hours HD without incident.  BP obtained via left calf, and remained consistent throughout trmt.  No arrhythmias occurred during HD trmt.

## 2018-04-16 NOTE — Progress Notes (Addendum)
[facility] NEPHROLOG[facility] PROGRESS NOTE  Assessment/ Plan: Pt is a 57 y.o. yo male ESRD on HD TTS, with syncopal episode, recurrent VT requiring shock.  HD: [facility]/ TTS 4.5h  122.5 kg  2/2 bath Hep none  RUA AVF - calc 0.5 ug tiw - venofer 7/10 completed 100 mg qhd  #Recurrent VT, chronic systolic heart failure with EF of 25 to 30%, nonischemic cardiomyopathy: Filling pressures very high on right heart cath.  Currently on amiodarone, mexiletine.  He has no chest pain or shortness of breath today.  Cardiology following  # ESRD: HD TTS.  Patient did not tolerate intermittent hemodialysis due to V. tach and not able to get much fluid off with regular dialysis.  He was on CRRT from 9/18-9/23. Try intermittent hemodialysis today.  Blood pressure acceptable.  Discussed with the patient and family today.   -4.5 hours, 2K, 3 to 4 L UF as tolerated. -Consider taking temporary catheter out when patient tolerated dialysis well.  # Anemia: Hemoglobin 12.6.   # Secondary hyperparathyroidism: Phosphorus 4.9.  off PhosLo currently.  # HTN/volume: On midodrine 15 mg 3 times daily. Off  Pressure.  Subjective: Seen and examined at bedside.  Sitting on bed.  Denied headache, nausea, vomiting, chest pain, shortness of breath.  Patient sister at bedside.  Objective Vital signs in last 24 hours: Vitals:   04/16/18 0300 04/16/18 0356 04/16/18 0400 04/16/18 0727  BP: 120/88  116/89   Pulse: 69  66   Resp: 17  14   Temp:  (!) 97.5 F (36.4 C)  (!) 97.5 F (36.4 C)  TempSrc:  Oral  Oral  SpO2: 95%  99%   Weight:      Height:       Weight change:   Intake/Output Summary (Last 24 hours) at 04/16/2018 0909 Last data filed at 04/16/2018 0600 Gross per 24 hour  Intake 600 ml  Output -  Net 600 ml       Labs: Basic Metabolic Panel: Recent Labs  Lab 04/14/18 0637 04/15/18 0328 04/16/18 0417  NA 136 134* 130*  K 4.3 4.1 5.7*  CL 98 97* 94*  CO2 26 26 21*  GLUCOSE 92 92  97  BUN 17 16 46*  CREATININE 2.18* 1.98* 5.75*  CALCIUM 8.8* 8.7* 9.3  PHOS 1.7* 1.7* 4.9*   Liver Function Tests: Recent Labs  Lab 04/14/18 0637 04/15/18 0328 04/16/18 0417  ALBUMIN 3.3* 3.2* 3.0*   No results for input(s): LIPASE, AMYLASE in the last 168 hours. No results for input(s): AMMONIA in the last 168 hours. CBC: Recent Labs  Lab 04/11/18 0415 04/12/18 0313 04/13/18 0435 04/14/18 0413 04/16/18 0417  WBC 21.4* 25.2* 21.2* 19.8* 19.4*  NEUTROABS 17.0* 20.9* 17.6* 15.1* 14.4*  HGB 11.6* 12.3* 11.6* 12.4* 12.6*  HCT 39.8 41.1 37.7* 41.2 42.2  MCV 100.5* 98.8 96.4 98.6 98.4  PLT 285 273 231 245 246   Cardiac Enzymes: No results for input(s): CKTOTAL, CKMB, CKMBINDEX, TROPONINI in the last 168 hours. CBG: No results for input(s): GLUCAP in the last 168 hours.  Iron Studies: No results for input(s): IRON, TIBC, TRANSFERRIN, FERRITIN in the last 72 hours. Studies/Results: No results found.  Medications: Infusions: . sodium chloride    . norepinephrine (LEVOPHED) Adult infusion Stopped (04/13/18 1001)    Scheduled Medications: . allopurinol  100 mg Oral BID  . amiodarone  200 mg Oral BID  . apixaban  5 mg Oral BID  . calcitRIOL  0.5 mcg  Oral Q T,Th,Sa-HD  . Chlorhexidine Gluconate Cloth  6 each Topical Daily  . Chlorhexidine Gluconate Cloth  6 each Topical Q0600  . mexiletine  150 mg Oral Q12H  . midodrine  15 mg Oral TID WC  . multivitamin  1 tablet Oral QHS  . ranolazine  500 mg Oral BID  . sodium chloride flush  10-40 mL Intracatheter Q12H  . sodium chloride flush  3 mL Intravenous Q12H  . sodium chloride flush  3 mL Intravenous Q12H    have reviewed scheduled and prn medications.  Physical Exam: General:NAD, comfortable Heart:RRR, s1s2 nl Lungs: clear bilateral, no crackles Abdomen:soft, Non-tender, non-distended Extremities: No edema. Dialysis Access: Right upper extremity aVF.  Dron Prasad Bhandari 04/16/2018,9:09 AM  LOS: 12 days

## 2018-04-16 NOTE — Evaluation (Signed)
Occupational Therapy Evaluation Patient Details Name: Eric Murillo MRN: [id number] DOB: [birth year] Today's Date: 04/16/2018    History of Present Illness Pt is a 57 y.o. yo male ESRD on HD TTS, with syncopal episode, recurrent VT requiring shock.   Clinical Impression   Pt is typically independent. He presents with generalized weakness, jerky quality to B UE movement, slow processing and poor standing balance. 2 person assist needed for ambulation with RW. Pt will need intensive rehab prior to return home with his supportive girlfriend. Will follow acutely.    Follow Up Recommendations  CIR    Equipment Recommendations  3 in 1 bedside commode    Recommendations for Other Services       Precautions / Restrictions Precautions Precautions: Fall Restrictions Weight Bearing Restrictions: No      Mobility Bed Mobility Overal bed mobility: Needs Assistance Bed Mobility: Supine to Sit     Supine to sit: HOB elevated;Min assist     General bed mobility comments: max directional v/c's, minA for trunk elevation, able to move LEs off EOB with increased time, used bed rail to pull trunk up  Transfers Overall transfer level: Needs assistance Equipment used: Rolling walker (2 wheeled) Transfers: Sit to/from Stand Sit to Stand: +2 physical assistance;Min assist;From elevated surface         General transfer comment: max verbal cues to push up from bed, scoot forward and bring feet back, increased time, min/modA to power up, shaky upon standing    Balance Overall balance assessment: Needs assistance Sitting-balance support: Feet supported;Bilateral upper extremity supported Sitting balance-Leahy Scale: Fair Sitting balance - Comments: fatigues quickly   Standing balance support: Bilateral upper extremity supported Standing balance-Leahy Scale: Poor Standing balance comment: dependent on RW and physical assist, flexed posture                           ADL  either performed or assessed with clinical judgement   ADL Overall ADL's : Needs assistance/impaired Eating/Feeding: Set up;Sitting   Grooming: Wash/dry hands;Wash/dry face;Oral care;Sitting;Minimal assistance   Upper Body Bathing: Moderate assistance;Sitting   Lower Body Bathing: Total assistance;Sit to/from stand   Upper Body Dressing : Moderate assistance;Sitting   Lower Body Dressing: Total assistance;Sit to/from stand   Toilet Transfer: +2 for physical assistance;Moderate assistance;Ambulation;BSC;RW   Toileting- Clothing Manipulation and Hygiene: Total assistance;Sit to/from stand       Functional mobility during ADLs: +2 for physical assistance;Moderate assistance;Rolling walker       Vision Patient Visual Report: No change from baseline       Perception     Praxis      Pertinent Vitals/Pain Pain Assessment: Faces Faces Pain Scale: Hurts a little bit Pain Location: bottom Pain Descriptors / Indicators: Sore Pain Intervention(s): Monitored during session     Hand Dominance Right   Extremity/Trunk Assessment Upper Extremity Assessment Upper Extremity Assessment: Generalized weakness(B UE jerking movements)   Lower Extremity Assessment Lower Extremity Assessment: Defer to PT evaluation   Cervical / Trunk Assessment Cervical / Trunk Assessment: Normal   Communication Communication Communication: No difficulties   Cognition Arousal/Alertness: Awake/alert Behavior During Therapy: Flat affect Overall Cognitive Status: Impaired/Different from baseline Area of Impairment: Problem solving                             Problem Solving: Slow processing General Comments: did appears to have some delayed processing however also extremely weak  and required more time to complete task   General Comments  pt with report of sore bottom but did not see it    Exercises     Shoulder Instructions      Home Living Family/patient expects to be  discharged to:: Inpatient rehab Living Arrangements: Spouse/significant other                               Additional Comments: lives with sig other in one story home with 2 STE, tub shower. no ADs at home      Prior Functioning/Environment Level of Independence: Independent        Comments: no AD required, driving        OT Problem List: Decreased strength;Decreased activity tolerance;Impaired balance (sitting and/or standing);Decreased coordination;Decreased cognition;Decreased knowledge of use of DME or AE;Impaired UE functional use      OT Treatment/Interventions: Self-care/ADL training;DME and/or AE instruction;Therapeutic activities;Cognitive remediation/compensation;Patient/family education;Balance training;Therapeutic exercise    OT Goals(Current goals can be found in the care plan section) Acute Rehab OT Goals Patient Stated Goal: didn't state OT Goal Formulation: With patient Time For Goal Achievement: 04/30/18 Potential to Achieve Goals: Good ADL Goals Pt Will Perform Grooming: with min assist;standing Pt Will Perform Upper Body Dressing: with set-up;with supervision;sitting Pt Will Perform Lower Body Dressing: sit to/from stand;with min assist Pt Will Transfer to Toilet: with min assist;ambulating;bedside commode Pt Will Perform Toileting - Clothing Manipulation and hygiene: with min assist;sit to/from stand Pt/caregiver will Perform Home Exercise Program: Increased strength;Both right and left upper extremity;With Supervision(AROM) Additional ADL Goal #1: Pt will following 2 step commands withing 5 seconds of request with 75% accuracy.  OT Frequency: Min 3X/week   Barriers to D/C:            Co-evaluation PT/OT/SLP Co-Evaluation/Treatment: Yes Reason for Co-Treatment: Complexity of the patient's impairments (multi-system involvement);For patient/therapist safety PT goals addressed during session: Mobility/safety with mobility OT goals addressed  during session: ADL's and self-care      AM-PAC PT "6 Clicks" Daily Activity     Outcome Measure Help from another person eating meals?: A Little Help from another person taking care of personal grooming?: A Little Help from another person toileting, which includes using toliet, bedpan, or urinal?: Total Help from another person bathing (including washing, rinsing, drying)?: A Lot Help from another person to put on and taking off regular upper body clothing?: A Lot Help from another person to put on and taking off regular lower body clothing?: Total 6 Click Score: 12   End of Session Equipment Utilized During Treatment: Gait belt;Rolling walker Nurse Communication: Mobility status  Activity Tolerance: Patient limited by fatigue Patient left: in chair;with call bell/phone within reach;with chair alarm set;with family/visitor present  OT Visit Diagnosis: Unsteadiness on feet (R26.81);Other abnormalities of gait and mobility (R26.89);Muscle weakness (generalized) (M62.81);Other symptoms and signs involving cognitive function                Time: 5009-3818 OT Time Calculation (min): 32 min Charges:  OT General Charges $OT Visit: 1 Visit OT Evaluation $OT Eval Moderate Complexity: 1 Mod  Martie Round, OTR/L Acute Rehabilitation Services Pager: [phone] Office: [phone]  Evern Bio 04/16/2018, 11:47 AM

## 2018-04-16 NOTE — Progress Notes (Signed)
Rehab Admissions Coordinator Note:  Patient was screened by Clois Dupes for appropriateness for an Inpatient Acute Rehab Consult per PT and OT recommendations.  Noted plans for IHD today and palliative team following for GOC. Not LVAD candidate. I would like to follow to see his medical progress and goals before determining rehab venue needs/options. I will follow.   Ottie Glazier, RN, MSN Rehab Admissions Coordinator [phone] 04/16/2018 11:53 AM

## 2018-04-17 ENCOUNTER — Inpatient Hospital Stay (HOSPITAL_COMMUNITY): Payer: Medicaid Other

## 2018-04-17 DIAGNOSIS — R5381 Other malaise: Secondary | ICD-10-CM

## 2018-04-17 LAB — RENAL FUNCTION PANEL
Albumin: 3 g/dL — ABNORMAL LOW (ref 3.5–5.0)
Anion gap: 13 (ref 5–15)
BUN: 37 mg/dL — AB (ref 6–20)
CHLORIDE: 91 mmol/L — AB (ref 98–111)
CO2: 25 mmol/L (ref 22–32)
Calcium: 9 mg/dL (ref 8.9–10.3)
Creatinine, Ser: 5.21 mg/dL — ABNORMAL HIGH (ref 0.61–1.24)
GFR calc Af Amer: 13 mL/min — ABNORMAL LOW (ref >60)
GFR, EST NON AFRICAN AMERICAN: 11 mL/min — AB (ref >60)
GLUCOSE: 92 mg/dL (ref 70–99)
Phosphorus: 4.9 mg/dL — ABNORMAL HIGH (ref 2.5–4.6)
Potassium: 4.9 mmol/L (ref 3.5–5.1)
Sodium: 129 mmol/L — ABNORMAL LOW (ref 135–145)

## 2018-04-17 LAB — CBC WITH DIFFERENTIAL/PLATELET
Abs Immature Granulocytes: 0.5 10*3/uL — ABNORMAL HIGH (ref 0.0–0.1)
Basophils Absolute: 0.1 10*3/uL (ref 0.0–0.1)
Basophils Relative: 0 %
EOS PCT: 0 %
Eosinophils Absolute: 0.1 10*3/uL (ref 0.0–0.7)
HCT: 42.1 % (ref 39.0–52.0)
HEMOGLOBIN: 12.8 g/dL — AB (ref 13.0–17.0)
IMMATURE GRANULOCYTES: 2 %
Lymphocytes Relative: 11 %
Lymphs Abs: 2 10*3/uL (ref 0.7–4.0)
MCH: 29.4 pg (ref 26.0–34.0)
MCHC: 30.4 g/dL (ref 30.0–36.0)
MCV: 96.8 fL (ref 78.0–100.0)
Monocytes Absolute: 2.7 10*3/uL — ABNORMAL HIGH (ref 0.1–1.0)
Monocytes Relative: 14 %
Neutro Abs: 14.2 10*3/uL — ABNORMAL HIGH (ref 1.7–7.7)
Neutrophils Relative %: 73 %
Platelets: 269 10*3/uL (ref 150–400)
RBC: 4.35 MIL/uL (ref 4.22–5.81)
RDW: 16.7 % — ABNORMAL HIGH (ref 11.5–15.5)
WBC: 19.5 10*3/uL — AB (ref 4.0–10.5)

## 2018-04-17 MED ORDER — CHLORHEXIDINE GLUCONATE CLOTH 2 % EX PADS
6.0000 | MEDICATED_PAD | Freq: Every day | CUTANEOUS | Status: DC
  Administered 2018-04-17: 6 via TOPICAL

## 2018-04-17 NOTE — Progress Notes (Signed)
SLP Cancellation Note  Patient Details Name: Eric Murillo MRN: [id number] DOB: [birth year]   Cancelled treatment:       Reason Eval/Treat Not Completed: Other (comment). Pt with plans for esophagram. Will f/u after that test. Discussed with PA, will defer ordered MBS unless warranted after SLP clinical evaluation   Fabiha Rougeau, Riley Nearing 04/17/2018, 7:56 AM

## 2018-04-17 NOTE — Progress Notes (Addendum)
Patient ID: Eric Murillo, male   DOB: 1961/01/22, 57 y.o.   MRN: [id number]     Advanced Heart Failure Rounding Note  PCP-Cardiologist: [name], MD   Subjective:    Presented to [facility] ED after a syncopal episode. 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 Transferred to [facility] and had another episode of VT in ED that required ATP.   9/18 VT arrest. Amio drip restarted but now back to po.   Remains on midodrine 15 mg tid. Tolerated HD OK 04/16/18   Feeling tired this am, but OK. No SOB. Remains very week, and requiring ++ assist for PT. Having pill dysphagia. OK with liquids.   RHC/LHC 04/11/2018  1. Low, but not markedly low, cardiac output.  2. Pulmonary venous hypertension.  3. Markedly elevated right and left heart filling pressures.   4. No significant coronary disease.   RHC Procedural Findings: Hemodynamics (mmHg) RA mean 18 RV 65/17 PA 69/30, mean 47 PCWP mean 36 LV 100/27 AO 97/65 Oxygen saturations: PA 52% AO 94% Cardiac Output (Fick) 4.98  Cardiac Index (Fick) 2.12 PVR 2.2 WU Cardiac Output (Thermo) 4.85 Cardiac Index (Thermo) 2.06  PVR 2.3 WU   Objective:   Weight Range: 111.7 kg Body mass index is 36.37 kg/m.   Vital Signs:   Temp:  [97.5 F (36.4 C)-97.7 F (36.5 C)] 97.6 F (36.4 C) (09/25 0400) Pulse Rate:  [57-78] 77 (09/25 0500) Resp:  [9-22] 13 (09/25 0500) BP: (85-148)/(42-83) 135/51 (09/25 0500) SpO2:  [92 %-100 %] 98 % (09/25 0500) Last BM Date: 04/15/18  Weight change: Filed Weights   04/13/18 0730 04/14/18 0700 04/15/18 0700  Weight: 117.3 kg 114.9 kg 111.7 kg    Intake/Output:   Intake/Output Summary (Last 24 hours) at 04/17/2018 0736 Last data filed at 04/16/2018 2000 Gross per 24 hour  Intake 120 ml  Output 3411 ml  Net -3291 ml      Physical Exam   General: Chronically ill appearing. NAD.  HEENT: Normal Neck: Supple. JVP 8-9 cm Carotids 2+ bilat; no bruits. No thyromegaly or nodule  noted. Cor: PMI nondisplaced. RRR, No M/G/R noted Lungs: CTAB, normal effort. Abdomen: Soft, non-tender, non-distended, no HSM. No bruits or masses. +BS  Extremities: No cyanosis, clubbing, or rash. R and LLE no edema.  Neuro: Alert & orientedx3, cranial nerves grossly intact. moves all 4 extremities w/o difficulty. Affect pleasant   Telemetry   NSR 70s, No further VT, personally reviewed.   Labs    CBC Recent Labs    04/16/18 0417 04/17/18 0322  WBC 19.4* 19.5*  NEUTROABS 14.4* 14.2*  HGB 12.6* 12.8*  HCT 42.2 42.1  MCV 98.4 96.8  PLT 246 269   Basic Metabolic Panel Recent Labs    40/98/11 0328 04/16/18 0417 04/17/18 0322  NA 134* 130* 129*  K 4.1 5.7* 4.9  CL 97* 94* 91*  CO2 26 21* 25  GLUCOSE 92 97 92  BUN 16 46* 37*  CREATININE 1.98* 5.75* 5.21*  CALCIUM 8.7* 9.3 9.0  MG 3.0*  --   --   PHOS 1.7* 4.9* 4.9*   Liver Function Tests Recent Labs    04/16/18 0417 04/17/18 0322  ALBUMIN 3.0* 3.0*   No results for input(s): LIPASE, AMYLASE in the last 72 hours. Cardiac Enzymes No results for input(s): CKTOTAL, CKMB, CKMBINDEX, TROPONINI in the last 72 hours.  BNP: BNP (last 3 results) Recent Labs    07/26/17 0916 11/03/17 1238 11/13/17 0400  BNP 399.7*  1,160.1* 1,085.3*    ProBNP (last 3 results) No results for input(s): PROBNP in the last 8760 hours.   D-Dimer No results for input(s): DDIMER in the last 72 hours. Hemoglobin A1C No results for input(s): HGBA1C in the last 72 hours. Fasting Lipid Panel No results for input(s): CHOL, HDL, LDLCALC, TRIG, CHOLHDL, LDLDIRECT in the last 72 hours. Thyroid Function Tests No results for input(s): TSH, T4TOTAL, T3FREE, THYROIDAB in the last 72 hours.  Invalid input(s): FREET3  Other results:   Imaging    No results found.   Medications:     Scheduled Medications: . allopurinol  100 mg Oral BID  . amiodarone  200 mg Oral BID  . apixaban  5 mg Oral BID  . calcitRIOL  0.5 mcg Oral Q  T,Th,Sa-HD  . Chlorhexidine Gluconate Cloth  6 each Topical Daily  . Chlorhexidine Gluconate Cloth  6 each Topical Q0600  . mexiletine  150 mg Oral Q12H  . midodrine  15 mg Oral TID WC  . multivitamin  1 tablet Oral QHS  . ranolazine  500 mg Oral BID  . sodium chloride flush  10-40 mL Intracatheter Q12H  . sodium chloride flush  3 mL Intravenous Q12H  . sodium chloride flush  3 mL Intravenous Q12H    Infusions: . sodium chloride    . sodium chloride    . sodium chloride    . norepinephrine (LEVOPHED) Adult infusion Stopped (04/13/18 1001)    PRN Medications: sodium chloride, sodium chloride, sodium chloride, acetaminophen, bismuth subsalicylate, HYDROcodone-acetaminophen, ondansetron (ZOFRAN) IV, promethazine, senna-docusate, sodium chloride flush, sodium chloride flush, sorbitol    Patient Profile   Eric Murillo is a 57 y.o. male with history of morbid obesity, hyperlipidemia, HTN, atrial flutter (s/p TEE DC-CV 04/03/14 and again in 8/17), NICM with chronic systolic HF and severe OSA but unable to tolerate CPAP.    Assessment/Plan   1. Recurrent VT requiring shock and ATP: Follows with Dr Graciela Husbands. EP has been consulted. Last saw Dr Graciela Husbands 04/03/18 and was having recurrent episodes of VF. Mexilitine was added in addition to his amio.  Coronary angiography this admission showed no CAD. Cardiac output was low but not markedly so.  Concern for VT as sign of end stage cardiomyopathy.  No VT overnight.  - On po amiodarone, mexilitene and Ranexa. Need to be cautious of Ranexa in setting of ESRD (will discuss with Dr. Graciela Husbands).  - Quiescent on meds above.   2. Chronic systolic heart failure: Nonischemic cardiomyopathy, likely related to HTN.  Echo in 8/14 with EF 45% but EF down to 25-30% on TEE while in atrial flutter (03/2014).  Echo (1/16) with EF ~25% and echo in 6/16 with EF 30-35%.  No cardiac MRI done with elevated creatinine and size. There was concern for cardiac amyloidosis.  However,  negative SPEP and abdominal fat pad biopsy negative. Low voltage on ECG may be due to obesity and not amyloidosis. Echo 10/2017: EF 20-25%, mild to mod MR. S/P [employer] Jude ICD.  Marked volume overload by RHC this admission.  Unable get fluid off with HD initially due to hypotension - Off CVVH. Tolerated iHD well.   - Continue midodrine 15 mg tid.  - Continue midodrine 15 tid. - He has end stage HF, not candidate for LVAD and unlikely to be able to get to heart/kidney transplant. Palliative care following, so far patient wants to be full code.  If we are unable to transition back to stable intermittent HD without hypotension,  we will not have further options for him.  His family understands this though we are unclear how well he understands the situation.  3. Atypical Atrial Flutter: Recurrent.  S/p several DCCVs, most recently 11/21/17. Has been seen by EP. Not felt to be amenable to ablation. He is in NSR with long PR interval.  - Continue on Eliqus 5 mg BID 4. ESRD: Now on HD T/R/Sat.  Intolerant iHD earlier this admission, hypotensive when trying to pull off excess fluid and had VT.  Now on CVVHD.  As above, now off norepinephrine.   - Tolerated iHD well.  5. OSA:  - Does not tolerate CPAP.  6. Morbid Obesity:   - Body mass index is 36.37 kg/m.  7. ID:  - Afebrile. WBC 19.5 this am.   8. Deconditioning - PT following.   - Will re-engage CIR for opinion. May be ready soon if can tolerate 9. Pill dysphagia - Will order barium swallow and involve GI if +.   Length of Stay: [address]  Luane [employer]  04/17/2018, 7:36 AM  Advanced Heart Failure Team Pager [phone] (M-F; 7a - 4p)  Please contact [facility] for night-coverage after hours (4p -7a ) and weekends on [email or web address]  Patient seen with PA, agree with the above note.   Mr Lofquist tolerated i-HD well yesterday without hypotension. No VT.   Pill dysphagia, will order barium swallow as above.   He remains markedly weak, needing  lots of assistance to get out of bed.   I think that CIR would be appropriate for him.  He appears to be stable now back on intermittent HD, next tomorrow.  Will as rehab team to check back by on him.   [name] 04/17/2018 7:52 AM

## 2018-04-17 NOTE — Progress Notes (Signed)
Inpatient Rehabilitation-Admissions Coordinator   Met with pt and his family at the bedside as follow up from PM&R consult. Pt and family educated on program details, provided with brochures and handouts, provided them with estimated LOS, anticipated level of assist at DC, and expectations for program. Pt initially hesitant regarding intensity of program but after explanation, pt agreeable to program. With permission, [facility] contacted pt's significant other to confirm DC plan and support system.  At this time, all are in agreement for CIR. AC will follow for medical readiness.  Please call if questions.   Jhonnie Garner, OTR/L  Rehab Admissions Coordinator  [phone] 04/17/2018 3:08 PM

## 2018-04-17 NOTE — Progress Notes (Signed)
[facility] NEPHROLOGY PROGRESS NOTE  Assessment/ Plan: Pt is a 57 y.o. yo male ESRD on HD TTS, with syncopal episode, recurrent VT requiring shock.  HD: [facility]/ TTS 4.5h  122.5 kg  2/2 bath Hep none  RUA AVF - calc 0.5 ug tiw - venofer 7/10 completed 100 mg qhd  #Recurrent VT, chronic systolic heart failure with EF of 25 to 30%, nonischemic cardiomyopathy: Filling pressures very high on right heart cath.  Currently on amiodarone, mexiletine.  He has no chest pain or shortness of breath today.  Cardiology following  # ESRD: HD TTS.  He was on CRRT from 9/18-9/23. He has intermittent hemodialysis on 9/24, tolerated well with no VT.  Blood pressure acceptable.  Plan for next dialysis tomorrow.  -likely dc to CIR.   # Anemia: Hemoglobin 12.8.   # Secondary hyperparathyroidism: Phosphorus 4.9.  off PhosLo currently.  # HTN/volume: On midodrine 15 mg 3 times daily.   Subjective: Seen and examined at bedside.  Tolerated dialysis well yesterday.  He feels weak.  Denied headache, dizziness, chest pain or shortness of breath.  His girlfriend is at bedside. Objective Vital signs in last 24 hours: Vitals:   04/17/18 0300 04/17/18 0400 04/17/18 0500 04/17/18 0749  BP: (!) 98/51 (!) 132/51 (!) 135/51   Pulse: 77 77 77   Resp: 15 13 13    Temp:  97.6 F (36.4 C)  97.6 F (36.4 C)  TempSrc:  Axillary  Oral  SpO2: 98% 99% 98%   Weight:      Height:       Weight change:   Intake/Output Summary (Last 24 hours) at 04/17/2018 0941 Last data filed at 04/16/2018 2000 Gross per 24 hour  Intake 120 ml  Output 3411 ml  Net -3291 ml       Labs: Basic Metabolic Panel: Recent Labs  Lab 04/15/18 0328 04/16/18 0417 04/17/18 0322  NA 134* 130* 129*  K 4.1 5.7* 4.9  CL 97* 94* 91*  CO2 26 21* 25  GLUCOSE 92 97 92  BUN 16 46* 37*  CREATININE 1.98* 5.75* 5.21*  CALCIUM 8.7* 9.3 9.0  PHOS 1.7* 4.9* 4.9*   Liver Function Tests: Recent Labs  Lab 04/15/18 0328  04/16/18 0417 04/17/18 0322  ALBUMIN 3.2* 3.0* 3.0*   No results for input(s): LIPASE, AMYLASE in the last 168 hours. No results for input(s): AMMONIA in the last 168 hours. CBC: Recent Labs  Lab 04/12/18 0313 04/13/18 0435 04/14/18 0413 04/16/18 0417 04/17/18 0322  WBC 25.2* 21.2* 19.8* 19.4* 19.5*  NEUTROABS 20.9* 17.6* 15.1* 14.4* 14.2*  HGB 12.3* 11.6* 12.4* 12.6* 12.8*  HCT 41.1 37.7* 41.2 42.2 42.1  MCV 98.8 96.4 98.6 98.4 96.8  PLT 273 231 245 246 269   Cardiac Enzymes: No results for input(s): CKTOTAL, CKMB, CKMBINDEX, TROPONINI in the last 168 hours. CBG: No results for input(s): GLUCAP in the last 168 hours.  Iron Studies: No results for input(s): IRON, TIBC, TRANSFERRIN, FERRITIN in the last 72 hours. Studies/Results: No results found.  Medications: Infusions: . sodium chloride    . sodium chloride    . sodium chloride      Scheduled Medications: . allopurinol  100 mg Oral BID  . amiodarone  200 mg Oral BID  . apixaban  5 mg Oral BID  . calcitRIOL  0.5 mcg Oral Q T,Th,Sa-HD  . Chlorhexidine Gluconate Cloth  6 each Topical Daily  . Chlorhexidine Gluconate Cloth  6 each Topical Q0600  .  mexiletine  150 mg Oral Q12H  . midodrine  15 mg Oral TID WC  . multivitamin  1 tablet Oral QHS  . ranolazine  500 mg Oral BID  . sodium chloride flush  10-40 mL Intracatheter Q12H  . sodium chloride flush  3 mL Intravenous Q12H  . sodium chloride flush  3 mL Intravenous Q12H    have reviewed scheduled and prn medications.  Physical Exam: General: Not in distress, comfortable, sitting on bed Heart:RRR, s1s2 nl Lungs: Clear bilateral, no crackles Abdomen:soft, Non-tender, non-distended Extremities: No edema. Dialysis Access: Right upper extremity aVF.  Dron Prasad Bhandari 04/17/2018,9:41 AM  LOS: 13 days

## 2018-04-17 NOTE — Progress Notes (Signed)
Physical Therapy Treatment Patient Details Name: Eric Murillo MRN: [id number] DOB: March 02, 1961 Today's Date: 04/17/2018    History of Present Illness Pt is a 57 y.o. yo male ESRD on HD TTS, with syncopal episode, recurrent VT requiring shock.    PT Comments    Pt demonstrating much improvement from yesterday. Pt able to ambulate 160' with 3 seated rest breaks and minA with chair follow. Pt remains to have decreased activity tolerance and quick onset of fatigue resulting in shakiness however pt able to tolerate more upright posture during ambulation. Pt was indep PTA but cont to require assist and RW for safe mobility at this time. Pt strongly desires to return home and PT cont to recommend CIR upon d/c. Pt with depressed spirits, discussed focusing on the progress he's making not what he can't do.   Follow Up Recommendations  CIR     Equipment Recommendations  None recommended by PT    Recommendations for Other Services Rehab consult     Precautions / Restrictions Precautions Precautions: Fall Restrictions Weight Bearing Restrictions: No    Mobility  Bed Mobility               General bed mobility comments: pt brought self to EOB upon PT arrival  Transfers Overall transfer level: Needs assistance Equipment used: Rolling walker (2 wheeled) Transfers: Sit to/from Stand Sit to Stand: Min assist         General transfer comment: verbal cues to push up from bed not pull up from walker,   Ambulation/Gait Ambulation/Gait assistance: Min assist;Mod assist;+2 safety/equipment(chair follow) Gait Distance (Feet): 60 Feet(x2, 40x1) Assistive device: Rolling walker (2 wheeled) Gait Pattern/deviations: Decreased stride length;Step-through pattern Gait velocity: slow Gait velocity interpretation: <1.31 ft/sec, indicative of household ambulator General Gait Details: pt able to stand more upright today and maintain longer, pt fatigues and become shaky approx 50' requiring a  seated rest break. Pt was able to amb 3 separate bouts with short seatead rest breaks today, much improved from yesterday   Stairs             Wheelchair Mobility    Modified Rankin (Stroke Patients Only)       Balance Overall balance assessment: Needs assistance Sitting-balance support: Feet supported;Single extremity supported Sitting balance-Leahy Scale: Fair     Standing balance support: No upper extremity supported Standing balance-Leahy Scale: Poor Standing balance comment: pt able to briefly let go of walker and pull up shorts                            Cognition Arousal/Alertness: Awake/alert Behavior During Therapy: Flat affect Overall Cognitive Status: Impaired/Different from baseline                                 General Comments: pt with depressed spirits, 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      Exercises      General Comments        Pertinent Vitals/Pain Pain Assessment: Faces Faces Pain Scale: Hurts even more Pain Location: bottom Pain Descriptors / Indicators: Sore Pain Intervention(s): Monitored during session    Home Living                      Prior Function            PT Goals (current goals  can now be found in the care plan section) Progress towards PT goals: Progressing toward goals    Frequency    Min 4X/week      PT Plan Current plan remains appropriate    Co-evaluation              AM-PAC PT "6 Clicks" Daily Activity  Outcome Measure  Difficulty turning over in bed (including adjusting bedclothes, sheets and blankets)?: Unable Difficulty moving from lying on back to sitting on the side of the bed? : Unable Difficulty sitting down on and standing up from a chair with arms (e.g., wheelchair, bedside commode, etc,.)?: Unable Help needed moving to and from a bed to chair (including a wheelchair)?: A Little Help needed walking in hospital room?: A  Little Help needed climbing 3-5 steps with a railing? : Total 6 Click Score: 10    End of Session Equipment Utilized During Treatment: Gait belt Activity Tolerance: Patient tolerated treatment well Patient left: in chair;with call bell/phone within reach;with chair alarm set;with family/visitor present Nurse Communication: Mobility status PT Visit Diagnosis: Unsteadiness on feet (R26.81)     Time: 6440-3474 PT Time Calculation (min) (ACUTE ONLY): 32 min  Charges:  $Gait Training: 23-37 mins                     [name], PT, DPT Acute Rehabilitation Services Pager #: [phone] Office #: [phone]    [name] 04/17/2018, 9:59 AM

## 2018-04-17 NOTE — Care Management Note (Addendum)
Case Management Note  Patient Details  Name: Makael Buelna MRN: [id number] Date of Birth: [birth year]  Subjective/Objective: 57 y.o. yo male ESRD on HD TTS, with syncopal episode, recurrent VT requiring shock.                 Action/Plan:  CIR Admissions Coordinator is currently discussing PT/OT recommendation for CIR with patient/family, with CM to continue to follow.  Expected Discharge Date:                  Expected Discharge Plan:  IP Rehab Facility  In-House Referral:  NA  Discharge planning Services  CM Consult  Post Acute Care Choice:  NA Choice offered to:  NA  DME Arranged:  N/A DME Agency:  NA  HH Arranged:  NA HH Agency:  NA  Status of Service:  In process, will continue to follow  If discussed at Long Length of Stay Meetings, dates discussed:    Additional Comments:  Colleen Can RN, BSN, NCM-BC, ACM-RN [phone] 04/17/2018, 1:56 PM

## 2018-04-17 NOTE — Evaluation (Signed)
Clinical/Bedside Swallow Evaluation Patient Details  Name: Eric Murillo MRN: [id number] Date of Birth: 08-Aug-1960  Today's Date: 04/17/2018 Time: SLP Start Time (ACUTE ONLY): 1520 SLP Stop Time (ACUTE ONLY): 1530 SLP Time Calculation (min) (ACUTE ONLY): 10 min  Past Medical History:  Past Medical History:  Diagnosis Date  . AICD (automatic cardioverter/defibrillator) present   . Anemia of chronic disease   . Arthritis   . Atrial flutter (HCC)    DCCV 9/15  . Chest pain on exertion   . Chronic systolic congestive heart failure, NYHA class 2 (HCC)    EF 20-25% 1/16  . CKD (chronic kidney disease) stage 3, GFR 30-59 ml/min (HCC)   . Dyslipidemia   . Gout    Of big toe  . Hyperglycemia   . Hyperlipidemia   . Hypertension   . Morbid obesity with BMI of 45.0-49.9, adult (HCC)   . Non-ischemic cardiomyopathy (HCC)   . Organic erectile dysfunction   . OSA (obstructive sleep apnea)   . Pilonidal cyst   . Submandibular sialolithiasis    Right   Past Surgical History:  Past Surgical History:  Procedure Laterality Date  . AV FISTULA PLACEMENT Right 11/12/2017   Procedure: CREATION of RIGHT BRACHIAL CEPHALIC ARTERIOVENOUS (AV) FISTULA;  Surgeon: Sherren Kerns, MD;  Location: [facility] OR;  Service: Vascular;  Laterality: Right;  . CARDIAC CATHETERIZATION  2012  . CARDIOVERSION N/A 04/03/2014   Procedure: CARDIOVERSION;  Surgeon: Laurey Morale, MD;  Location: [facility] ENDOSCOPY;  Service: Cardiovascular;  Laterality: N/A;  . CARDIOVERSION N/A 03/09/2016   Procedure: CARDIOVERSION;  Surgeon: Laurey Morale, MD;  Location: [facility] ENDOSCOPY;  Service: Cardiovascular;  Laterality: N/A;  . CARDIOVERSION N/A 06/06/2017   Procedure: CARDIOVERSION;  Surgeon: Laurey Morale, MD;  Location: [facility] ENDOSCOPY;  Service: Cardiovascular;  Laterality: N/A;  . CARDIOVERSION N/A 11/21/2017   Procedure: CARDIOVERSION;  Surgeon: Laurey Morale, MD;  Location: [facility] ENDOSCOPY;  Service: Cardiovascular;  Laterality: N/A;   . COLONOSCOPY    . EP IMPLANTABLE DEVICE N/A 05/21/2015   Procedure: ICD Implant;  Surgeon: Duke Salvia, MD;  Location: [facility] INVASIVE CV LAB;  Service: Cardiovascular;  Laterality: N/A;  . INSERTION OF DIALYSIS CATHETER Right 11/12/2017   Procedure: PLACEMENT of right internal jugualr tunneled DIALYSIS CATHETER with removal or temporary dialysis catheter;  Surgeon: Sherren Kerns, MD;  Location: [facility] OR;  Service: Vascular;  Laterality: Right;  . IR FLUORO GUIDE CV LINE RIGHT  11/08/2017  . IR US GUIDE VASC ACCESS RIGHT  11/08/2017  . LEFT AND RIGHT HEART CATHETERIZATION WITH CORONARY ANGIOGRAM N/A 04/24/2014   Procedure: LEFT AND RIGHT HEART CATHETERIZATION WITH CORONARY ANGIOGRAM;  Surgeon: Laurey Morale, MD;  Location: [facility] CATH LAB;  Service: Cardiovascular;  Laterality: N/A;  . RIGHT/LEFT HEART CATH AND CORONARY ANGIOGRAPHY N/A 04/13/2018   Procedure: RIGHT/LEFT HEART CATH AND CORONARY ANGIOGRAPHY;  Surgeon: Laurey Morale, MD;  Location: [facility] INVASIVE CV LAB;  Service: Cardiovascular;  Laterality: N/A;  . SALIVARY GLAND SURGERY  09/12/2012  . SUBMANDIBULAR GLAND EXCISION Right 09/12/2012   Procedure: Removal Right Submandibular Larina Bras;  Surgeon: Serena Colonel, MD;  Location: [facility] OR;  Service: ENT;  Laterality: Right;  . TEE WITHOUT CARDIOVERSION N/A 04/03/2014   Procedure: TRANSESOPHAGEAL ECHOCARDIOGRAM (TEE);  Surgeon: Laurey Morale, MD;  Location: [facility] ENDOSCOPY;  Service: Cardiovascular;  Laterality: N/A;  . TEE WITHOUT CARDIOVERSION N/A 03/09/2016   Procedure: TRANSESOPHAGEAL ECHOCARDIOGRAM (TEE);  Surgeon: Laurey Morale, MD;  Location: [facility] ENDOSCOPY;  Service: Cardiovascular;  Laterality: N/A;  . TEE WITHOUT CARDIOVERSION N/A 06/06/2017   Procedure: TRANSESOPHAGEAL ECHOCARDIOGRAM (TEE);  Surgeon: Laurey Morale, MD;  Location: [facility] ENDOSCOPY;  Service: Cardiovascular;  Laterality: N/A;  . TEE WITHOUT CARDIOVERSION N/A 11/21/2017   Procedure: TRANSESOPHAGEAL ECHOCARDIOGRAM (TEE);  Surgeon:  Laurey Morale, MD;  Location: [facility] ENDOSCOPY;  Service: Cardiovascular;  Laterality: N/A;   HPI:  57 y.o. male with history of ESRD on HD TTS, OSA-unable to tolerate CPAP, syncopal episode, recurrent VT requiring shock and ATP. C/o dysphagia to pills.  Lungs are clear, no crackles.  Esophagram 9/25 revealed normal esophagus, no hypopharyngeal abnormalities.  No strictures/masses/ulcerations.  No reflux.  Pt unable to swallow barium tablet.  He has been working with RN to determine which pills can be crushed and states that it helps to swallow pills in applesauce.  Was followed by SLP service briefly in April 2019 due to a post-extubation dysphagia which resolved prior to D/C.    Assessment / Plan / Recommendation Clinical Impression  Pt presents with functional oropharyngeal swallow with adequate mastication, brisk swallow response, no overt s/s of aspiration.  No deficits identified during today's esophagram, excluding inability to swallow pill.  There are no obvious explanations, and no clinical s/s during today's assessment that elucidate source of problem.  Recommend continuing current renal diet, thin liquids.  When able, crush pills or take whole in applesauce followed by liquid wash.  No other SLP needs identified - our service will sign off. Pt agrees with plan.   SLP Visit Diagnosis: Dysphagia, unspecified (R13.10)    Aspiration Risk  No limitations    Diet Recommendation   renal diet, thin liquids  Medication Administration: Crushed with puree when permissible; otherwise, whole in puree with liquids after   Other  Recommendations Oral Care Recommendations: Oral care BID   Follow up Recommendations        Frequency and Duration            Prognosis        Swallow Study   General Date of Onset: 03/25/2018 HPI: 57 y.o. male with history of ESRD on HD TTS, OSA-unable to tolerate CPAP, syncopal episode, recurrent VT requiring shock and ATP. C/o dysphagia to pills.  Lungs are  clear, no crackles.  Esophagram 9/25 revealed normal esophagus, no hypopharyngeal abnormalities.  No strictures/masses/ulcerations.  No reflux.  Pt unable to swallow barium tablet.  He has been working with RN to determine which pills can be crushed and states that it helps to swallow pills in applesauce.  Was followed by SLP service briefly in April 2019 due to a post-extubation dysphagia which resolved prior to D/C.  Type of Study: Bedside Swallow Evaluation Previous Swallow Assessment: see HPI Diet Prior to this Study: Regular;Thin liquids(renal diet) Temperature Spikes Noted: No Respiratory Status: Room air History of Recent Intubation: No Behavior/Cognition: Alert Oral Cavity Assessment: Within Functional Limits Oral Care Completed by SLP: No Oral Cavity - Dentition: Adequate natural dentition Vision: Functional for self-feeding Self-Feeding Abilities: Able to feed self Patient Positioning: Upright in chair Baseline Vocal Quality: Normal Volitional Cough: Strong Volitional Swallow: Able to elicit    Oral/Motor/Sensory Function Overall Oral Motor/Sensory Function: Within functional limits   Ice Chips Ice chips: Not tested   Thin Liquid Thin Liquid: Within functional limits    Nectar Thick Nectar Thick Liquid: Not tested   Honey Thick Honey Thick Liquid: Not tested   Puree Puree: Not tested   Solid  Solid: Within functional limits      Eric Murillo 04/17/2018,3:35 PM  Eric Folks L. Samson Frederic, MA CCC/SLP Acute Rehabilitation Services Office number [phone] Pager [phone]

## 2018-04-17 NOTE — Consult Note (Signed)
Physical Medicine and Rehabilitation Consult   Reason for Consult: Debility.  Referring Physician: Dr. Shirlee Latch.    HPI: Eric Murillo is a 57 y.o. male with history fo ESRD, OSA-unable to tolerate CPAP, NICM with AICD and recent issues with tachyarrhythemias. He was admitted on 09/12 post syncopal episode and V tach noted on ICD interrogation.  He had recurrent episodes of V. tach and was started on IV lidocaine drip and EP consulted for input.  He was transitioned to mexiletine and cardiac cath 9/16 showed markedly elevated right and left heart filling pressures but no significant CAD. He has had issues with low BP and on 09/17, patient went into V. tach arrest requiring CODE BLUE with CPR during hemodialysis.  Amiodarone drip restarted and levophed added with initiation of CRRT.    Cardiology expressing concerns of VT as sing of endstage cardiomyopathy--A flutter not felt to be amenable to ablation. Palliative care consulted to help determine GOC and patient wants fulls cope of treatment.  Midodrine added for BP support and patient transitioned to HD today.  Therapy evaluations done and CIR recommended due to functional decline.    Review of Systems  Constitutional: Negative for chills and fever.  HENT: Negative for hearing loss and tinnitus.   Eyes: Negative for blurred vision and double vision.  Respiratory: Negative for cough and shortness of breath.   Cardiovascular: Positive for leg swelling. Negative for chest pain and palpitations.       Syncopal episodes averaging every other day for the past month.    Gastrointestinal: Negative for nausea and vomiting.  Musculoskeletal: Negative for myalgias.  Neurological: Positive for dizziness, weakness and headaches.  Psychiatric/Behavioral: Negative for memory loss.      Past Medical History:  Diagnosis Date  . AICD (automatic cardioverter/defibrillator) present   . Anemia of chronic disease   . Arthritis   . Atrial flutter  (HCC)    DCCV 9/15  . Chest pain on exertion   . Chronic systolic congestive heart failure, NYHA class 2 (HCC)    EF 20-25% 1/16  . CKD (chronic kidney disease) stage 3, GFR 30-59 ml/min (HCC)   . Dyslipidemia   . Gout    Of big toe  . Hyperglycemia   . Hyperlipidemia   . Hypertension   . Morbid obesity with BMI of 45.0-49.9, adult (HCC)   . Non-ischemic cardiomyopathy (HCC)   . Organic erectile dysfunction   . OSA (obstructive sleep apnea)   . Pilonidal cyst   . Submandibular sialolithiasis    Right    Past Surgical History:  Procedure Laterality Date  . AV FISTULA PLACEMENT Right 11/12/2017   Procedure: CREATION of RIGHT BRACHIAL CEPHALIC ARTERIOVENOUS (AV) FISTULA;  Surgeon: Sherren Kerns, MD;  Location: [facility] OR;  Service: Vascular;  Laterality: Right;  . CARDIAC CATHETERIZATION  2012  . CARDIOVERSION N/A 04/03/2014   Procedure: CARDIOVERSION;  Surgeon: Laurey Morale, MD;  Location: [facility] ENDOSCOPY;  Service: Cardiovascular;  Laterality: N/A;  . CARDIOVERSION N/A 03/09/2016   Procedure: CARDIOVERSION;  Surgeon: Laurey Morale, MD;  Location: [facility] ENDOSCOPY;  Service: Cardiovascular;  Laterality: N/A;  . CARDIOVERSION N/A 06/06/2017   Procedure: CARDIOVERSION;  Surgeon: Laurey Morale, MD;  Location: [facility] ENDOSCOPY;  Service: Cardiovascular;  Laterality: N/A;  . CARDIOVERSION N/A 11/21/2017   Procedure: CARDIOVERSION;  Surgeon: Laurey Morale, MD;  Location: [facility] ENDOSCOPY;  Service: Cardiovascular;  Laterality: N/A;  . COLONOSCOPY    . EP IMPLANTABLE DEVICE N/A  05/21/2015   Procedure: ICD Implant;  Surgeon: Duke Salvia, MD;  Location: [facility] INVASIVE CV LAB;  Service: Cardiovascular;  Laterality: N/A;  . INSERTION OF DIALYSIS CATHETER Right 11/12/2017   Procedure: PLACEMENT of right internal jugualr tunneled DIALYSIS CATHETER with removal or temporary dialysis catheter;  Surgeon: Sherren Kerns, MD;  Location: [facility] OR;  Service: Vascular;  Laterality: Right;  . IR FLUORO GUIDE CV  LINE RIGHT  11/08/2017  . IR US GUIDE VASC ACCESS RIGHT  11/08/2017  . LEFT AND RIGHT HEART CATHETERIZATION WITH CORONARY ANGIOGRAM N/A 04/24/2014   Procedure: LEFT AND RIGHT HEART CATHETERIZATION WITH CORONARY ANGIOGRAM;  Surgeon: Laurey Morale, MD;  Location: [facility] CATH LAB;  Service: Cardiovascular;  Laterality: N/A;  . RIGHT/LEFT HEART CATH AND CORONARY ANGIOGRAPHY N/A 04/03/2018   Procedure: RIGHT/LEFT HEART CATH AND CORONARY ANGIOGRAPHY;  Surgeon: Laurey Morale, MD;  Location: [facility] INVASIVE CV LAB;  Service: Cardiovascular;  Laterality: N/A;  . SALIVARY GLAND SURGERY  09/12/2012  . SUBMANDIBULAR GLAND EXCISION Right 09/12/2012   Procedure: Removal Right Submandibular Larina Bras;  Surgeon: Serena Colonel, MD;  Location: [facility] OR;  Service: ENT;  Laterality: Right;  . TEE WITHOUT CARDIOVERSION N/A 04/03/2014   Procedure: TRANSESOPHAGEAL ECHOCARDIOGRAM (TEE);  Surgeon: Laurey Morale, MD;  Location: [facility] ENDOSCOPY;  Service: Cardiovascular;  Laterality: N/A;  . TEE WITHOUT CARDIOVERSION N/A 03/09/2016   Procedure: TRANSESOPHAGEAL ECHOCARDIOGRAM (TEE);  Surgeon: Laurey Morale, MD;  Location: [facility] ENDOSCOPY;  Service: Cardiovascular;  Laterality: N/A;  . TEE WITHOUT CARDIOVERSION N/A 06/06/2017   Procedure: TRANSESOPHAGEAL ECHOCARDIOGRAM (TEE);  Surgeon: Laurey Morale, MD;  Location: [facility] ENDOSCOPY;  Service: Cardiovascular;  Laterality: N/A;  . TEE WITHOUT CARDIOVERSION N/A 11/21/2017   Procedure: TRANSESOPHAGEAL ECHOCARDIOGRAM (TEE);  Surgeon: Laurey Morale, MD;  Location: [facility] ENDOSCOPY;  Service: Cardiovascular;  Laterality: N/A;    Family History  Problem Relation Age of Onset  . Hypertension Mother   . Hypertension Father   . Cancer Father        brother died of brain cancer; sister died of bone cancer  . Diabetes Sister   . Diabetes Brother   . Colon cancer Maternal Aunt   . Cardiomyopathy Neg Hx     Social History:  Lives with GF. Independent PTA but has been weaker since last admission.  He  reports that he has never smoked. He has never used smokeless tobacco. He reports that he does not drink alcohol or use drugs.    Allergies  Allergen Reactions  . Nsaids Other (See Comments)    Cannot take to due kidney issues  . Beta Adrenergic Blockers Other (See Comments)    An unnamed, white-colored Beta Blocker made him "feel funny" = made him feel "cagey"    Medications Prior to Admission  Medication Sig Dispense Refill  . allopurinol (ZYLOPRIM) 100 MG tablet TAKE 1 TABLET BY MOUTH TWO TIMES DAILY (Patient taking differently: Take 100 mg by mouth 2 (two) times daily. ) 180 tablet 1  . amiodarone (PACERONE) 200 MG tablet Take 1 tablet (200 mg total) by mouth 2 (two) times daily. 180 tablet 3  . amoxicillin (AMOXIL) 500 MG capsule Take 500 mg by mouth 3 (three) times daily. 7 DAY COURSE STARTING ON 03/29/2018    . apixaban (ELIQUIS) 2.5 MG TABS tablet Take 1 tablet (2.5 mg total) by mouth 2 (two) times daily. 60 tablet 1  . bismuth subsalicylate (PEPTO BISMOL) 262 MG chewable tablet Chew 524 mg by mouth daily as  needed for indigestion.    . calcitRIOL (ROCALTROL) 0.5 MCG capsule Take 1 capsule (0.5 mcg total) by mouth every other day. (Patient taking differently: Take 0.5 mcg by mouth Every Tuesday,Thursday,and Saturday with dialysis. ) 15 capsule 0  . calcium acetate (PHOSLO) 667 MG capsule TAKE 2 CAPSULES (1,334 MG TOTAL) BY MOUTH 3 (THREE) TIMES DAILY WITH MEALS. 180 capsule 0  . carvedilol (COREG) 3.125 MG tablet Take 1 tablet (3.125 mg total) by mouth 2 (two) times daily with a meal. Give ONLY on non-HD days. Hold on dialysis days. 40 tablet 6  . diclofenac sodium (VOLTAREN) 1 % GEL Apply 4 g topically 4 (four) times daily. (Patient taking differently: Apply 4 g topically 4 (four) times daily as needed (for pain at affected sites). ) 1 Tube 2  . HYDROcodone-acetaminophen (NORCO/VICODIN) 5-325 MG tablet Take 1-2 tablets by mouth every 4 (four) hours as needed. 15 tablet 0  . mexiletine  (MEXITIL) 200 MG capsule Take 1 capsule (200 mg total) by mouth 2 (two) times daily. 180 capsule 3  . multivitamin (RENA-VIT) TABS tablet Take 1 tablet by mouth at bedtime. 30 tablet 6  . clindamycin (CLEOCIN) 300 MG capsule Take 1 capsule (300 mg total) by mouth 3 (three) times daily. (Patient not taking: Reported on 03/24/2018) 21 capsule 0    Home: Home Living Family/patient expects to be discharged to:: Inpatient rehab Living Arrangements: Spouse/significant other Additional Comments: lives with sig other in one story home with 2 STE, tub shower. no ADs at home  Functional History: Prior Function Level of Independence: Independent Comments: no AD required, driving Functional Status:  Mobility: Bed Mobility Overal bed mobility: Needs Assistance Bed Mobility: Supine to Sit Supine to sit: HOB elevated, Min assist General bed mobility comments: max directional v/c's, minA for trunk elevation, able to move LEs off EOB with increased time, used bed rail to pull trunk up Transfers Overall transfer level: Needs assistance Equipment used: Rolling walker (2 wheeled) Transfers: Sit to/from Stand Sit to Stand: +2 physical assistance, Min assist, From elevated surface General transfer comment: max verbal cues to push up from bed, scoot forward and bring feet back, increased time, min/modA to power up, shaky upon standing Ambulation/Gait Ambulation/Gait assistance: Mod assist, +2 physical assistance, +2 safety/equipment Gait Distance (Feet): 10 Feet Assistive device: Rolling walker (2 wheeled) Gait Pattern/deviations: Step-to pattern, Decreased stride length, Trunk flexed, Narrow base of support General Gait Details: pt very shaky, difficulty maintain upright posture, trunk and knees flexed, pt with R lateral lean as well Gait velocity: slow Gait velocity interpretation: <1.8 ft/sec, indicate of risk for recurrent falls    ADL: ADL Overall ADL's : Needs  assistance/impaired Eating/Feeding: Set up, Sitting Grooming: Wash/dry hands, Wash/dry face, Oral care, Sitting, Minimal assistance Upper Body Bathing: Moderate assistance, Sitting Lower Body Bathing: Total assistance, Sit to/from stand Upper Body Dressing : Moderate assistance, Sitting Lower Body Dressing: Total assistance, Sit to/from stand Toilet Transfer: +2 for physical assistance, Moderate assistance, Ambulation, BSC, RW Toileting- Clothing Manipulation and Hygiene: Total assistance, Sit to/from stand Functional mobility during ADLs: +2 for physical assistance, Moderate assistance, Rolling walker  Cognition: Cognition Overall Cognitive Status: Impaired/Different from baseline Orientation Level: Oriented X4 Cognition Arousal/Alertness: Awake/alert Behavior During Therapy: Flat affect Overall Cognitive Status: Impaired/Different from baseline Area of Impairment: Problem solving Problem Solving: Slow processing General Comments: did appears to have some delayed processing however also extremely weak and required more time to complete task   Blood pressure (!) 135/51, pulse 77, temperature  97.6 F (36.4 C), temperature source Oral, resp. rate 13, height 5\' 9"  (1.753 m), weight 111.7 kg, SpO2 98 %. Physical Exam  Nursing note and vitals reviewed. Constitutional: He is oriented to person, place, and time.  Fatigued appearing. Soft voice.   HENT:  Head: Normocephalic and atraumatic.  Cardiovascular: Normal rate and regular rhythm.  Respiratory: Effort normal and breath sounds normal. No respiratory distress.  GI: Soft. Bowel sounds are normal.  Neurological: He is alert and oriented to person, place, and time.  Delayed answers. Able to follow basic commands without difficulty.   Skin: Skin is warm and dry.  Psychiatric: He has a normal mood and affect.  4/5 Motor  Bilateral delt , bi, tri, grip , HF , KE and ADF Speech without dysarthria Poor breath support  Results for  orders placed or performed during the hospital encounter of 04-20-18 (from the past 24 hour(s))  Renal function panel     Status: Abnormal   Collection Time: 04/17/18  3:22 AM  Result Value Ref Range   Sodium 129 (L) 135 - 145 mmol/L   Potassium 4.9 3.5 - 5.1 mmol/L   Chloride 91 (L) 98 - 111 mmol/L   CO2 25 22 - 32 mmol/L   Glucose, Bld 92 70 - 99 mg/dL   BUN 37 (H) 6 - 20 mg/dL   Creatinine, Ser 1.61 (H) 0.61 - 1.24 mg/dL   Calcium 9.0 8.9 - 09.6 mg/dL   Phosphorus 4.9 (H) 2.5 - 4.6 mg/dL   Albumin 3.0 (L) 3.5 - 5.0 g/dL   GFR calc non Af Amer 11 (L) >60 mL/min   GFR calc Af Amer 13 (L) >60 mL/min   Anion gap 13 5 - 15  CBC with Differential/Platelet     Status: Abnormal   Collection Time: 04/17/18  3:22 AM  Result Value Ref Range   WBC 19.5 (H) 4.0 - 10.5 K/uL   RBC 4.35 4.22 - 5.81 MIL/uL   Hemoglobin 12.8 (L) 13.0 - 17.0 g/dL   HCT 04.5 40.9 - 81.1 %   MCV 96.8 78.0 - 100.0 fL   MCH 29.4 26.0 - 34.0 pg   MCHC 30.4 30.0 - 36.0 g/dL   RDW 91.4 (H) 78.2 - 95.6 %   Platelets 269 150 - 400 K/uL   Neutrophils Relative % 73 %   Neutro Abs 14.2 (H) 1.7 - 7.7 K/uL   Lymphocytes Relative 11 %   Lymphs Abs 2.0 0.7 - 4.0 K/uL   Monocytes Relative 14 %   Monocytes Absolute 2.7 (H) 0.1 - 1.0 K/uL   Eosinophils Relative 0 %   Eosinophils Absolute 0.1 0.0 - 0.7 K/uL   Basophils Relative 0 %   Basophils Absolute 0.1 0.0 - 0.1 K/uL   Immature Granulocytes 2 %   Abs Immature Granulocytes 0.5 (H) 0.0 - 0.1 K/uL   No results found.   Assessment/Plan: Diagnosis: Debility, mild HIE post cardiac arrest 1. Does the need for close, 24 hr/day medical supervision in concert with the patient's rehab needs make it unreasonable for this patient to be served in a less intensive setting? Yes 2. Co-Morbidities requiring supervision/potential complications:  3. Due to bladder management, bowel management, safety, skin/wound care, disease management, medication administration, pain management and  patient education, does the patient require 24 hr/day rehab nursing? Yes 4. Does the patient require coordinated care of a physician, rehab nurse, PT (1-2 hrs/day, 5 days/week), OT (1-2 hrs/day, 5 days/week) and SLP (.5-1 hrs/day, 5 days/week) to  address physical and functional deficits in the context of the above medical diagnosis(es)? Yes Addressing deficits in the following areas: balance, endurance, locomotion, strength, transferring, bowel/bladder control, bathing, dressing, feeding, grooming, toileting, cognition and psychosocial support 5. Can the patient actively participate in an intensive therapy program of at least 3 hrs of therapy per day at least 5 days per week? Yes and may need cardiac precautions 6. The potential for patient to make measurable gains while on inpatient rehab is good 7. Anticipated functional outcomes upon discharge from inpatient rehab are supervision  with PT, supervision with OT, modified independent with SLP. 8. Estimated rehab length of stay to reach the above functional goals is: 10-14d 9.  10. Anticipated D/C setting: Home 11. Anticipated post D/C treatments: HH therapy 12. Overall Rehab/Functional Prognosis: good  RECOMMENDATIONS: This patient's condition is appropriate for continued rehabilitative care in the following setting: CIR Patient has agreed to participate in recommended program. Yes Note that insurance prior authorization may be required for reimbursement for recommended care.  Comment:   "I have personally performed a face to face diagnostic evaluation of this patient.  Additionally, I have reviewed and concur with the physician assistant's documentation above." Erick Colace M.D. [facility] FAAPM&R (Sports Med, Neuromuscular Med) Diplomate Am Board of Electrodiagnostic Med  Jacquelynn Cree, PA-C 04/17/2018

## 2018-04-17 NOTE — Progress Notes (Signed)
Progress Note  Patient Name: Eric Murillo Date of Encounter: 04/17/2018  Primary Cardiologist: [name], MD  Electrophysiologist; Dr. Graciela Husbands  Subjective  No chest pain or shortness of breath  Tolerated HD y=day  Inpatient Medications    Scheduled Meds: . allopurinol  100 mg Oral BID  . amiodarone  200 mg Oral BID  . apixaban  5 mg Oral BID  . calcitRIOL  0.5 mcg Oral Q T,Th,Sa-HD  . Chlorhexidine Gluconate Cloth  6 each Topical Daily  . Chlorhexidine Gluconate Cloth  6 each Topical Q0600  . Chlorhexidine Gluconate Cloth  6 each Topical Q0600  . mexiletine  150 mg Oral Q12H  . midodrine  15 mg Oral TID WC  . multivitamin  1 tablet Oral QHS  . ranolazine  500 mg Oral BID  . sodium chloride flush  10-40 mL Intracatheter Q12H  . sodium chloride flush  3 mL Intravenous Q12H  . sodium chloride flush  3 mL Intravenous Q12H   Continuous Infusions: . sodium chloride    . sodium chloride    . sodium chloride     PRN Meds: sodium chloride, sodium chloride, sodium chloride, acetaminophen, bismuth subsalicylate, HYDROcodone-acetaminophen, ondansetron (ZOFRAN) IV, promethazine, senna-docusate, sodium chloride flush, sodium chloride flush, sorbitol   Vital Signs    Vitals:   04/17/18 0500 04/17/18 0749 04/17/18 1140 04/17/18 1544  BP: (!) 135/51     Pulse: 77     Resp: 13     Temp:  97.6 F (36.4 C) (!) 97.3 F (36.3 C) (!) 97.4 F (36.3 C)  TempSrc:  Oral Oral Oral  SpO2: 98%     Weight:      Height:        Intake/Output Summary (Last 24 hours) at 04/17/2018 1655 Last data filed at 04/16/2018 2000 Gross per 24 hour  Intake 120 ml  Output 3411 ml  Net -3291 ml   Filed Weights   04/13/18 0730 04/14/18 0700 04/15/18 0700  Weight: 117.3 kg 114.9 kg 111.7 kg    Telemetry    N0 VT Personally reviewed     ECG    No new EKGs - Personally Reviewed  Physical Exam  Well developed and nourished in no acute distress HENT normal Neck supple with  JVP-flat Clear Regular rate and rhythm, no murmurs or gallops Abd-soft with active BS No Clubbing cyanosis edema Skin-warm and dry A & Oriented  Grossly normal sensory and motor function   Labs    Chemistry Recent Labs  Lab 04/15/18 0328 04/16/18 0417 04/17/18 0322  NA 134* 130* 129*  K 4.1 5.7* 4.9  CL 97* 94* 91*  CO2 26 21* 25  GLUCOSE 92 97 92  BUN 16 46* 37*  CREATININE 1.98* 5.75* 5.21*  CALCIUM 8.7* 9.3 9.0  ALBUMIN 3.2* 3.0* 3.0*  GFRNONAA 36* 10* 11*  GFRAA 41* 11* 13*  ANIONGAP 11 15 13      Hematology Recent Labs  Lab 04/14/18 0413 04/16/18 0417 04/17/18 0322  WBC 19.8* 19.4* 19.5*  RBC 4.18* 4.29 4.35  HGB 12.4* 12.6* 12.8*  HCT 41.2 42.2 42.1  MCV 98.6 98.4 96.8  MCH 29.7 29.4 29.4  MCHC 30.1 29.9* 30.4  RDW 15.7* 16.6* 16.7*  PLT 245 246 269    Cardiac EnzymesNo results for input(s): TROPONINI in the last 168 hours.  No results for input(s): TROPIPOC in the last 168 hours.   BNPNo results for input(s): BNP, PROBNP in the last 168 hours.   DDimer No  results for input(s): DDIMER in the last 168 hours.   Radiology    Dg Esophagus  Result Date: 04/17/2018 CLINICAL DATA:  Dysphagia with pills. EXAM: ESOPHOGRAM/BARIUM SWALLOW TECHNIQUE: Single contrast examination was performed using  thin barium. FLUOROSCOPY TIME:  Fluoroscopy Time:  1 minutes 30 seconds Radiation Exposure Index (if provided by the fluoroscopic device): 18.70 Number of Acquired Spot Images: 0 COMPARISON:  None. FINDINGS: The patient swallowed thin barium without difficulty. The esophagus has a normal appearance with no hypopharyngeal abnormalities. No strictures, masses or ulcerations were seen. No hiatal hernia or gastroesophageal reflux demonstrated during the examination. The patient was unable to swallow a barium tablet. IMPRESSION: Normal examination. The patient was unable to swallow a barium tablet. Electronically Signed   By: Beckie Salts M.D.   On: 04/17/2018 10:30     Cardiac Studies   11/21/17: TEE Study Conclusions - Left ventricle: The cavity size was mildly dilated. Wall thickness was normal. Systolic function was severely reduced. The estimated ejection fraction was in the range of 25% to 30%. Diffuse hypokinesis. - Aortic valve: There was no stenosis. - Aorta: The ascending aorta was normal in caliber. - Mitral valve: There was mild to moderate regurgitation. - Left atrium: The atrium was moderately dilated. No evidence of thrombus in the atrial cavity or appendage. - Right ventricle: The cavity size was normal. Pacer wire or catheter noted in right ventricle. Systolic function was mildly reduced. - Right atrium: HD catheter noted in SVC. The atrium was mildly dilated. - Atrial septum: No ASD or PFO by color doppler. Impressions: - May proceed to DCCV.  09/02/14: Myoview (unable to open result)  Patient Profile     57 y.o. male with a hx of HTN, HLD, PAFlutter (he was seen by Dr. Elberta Fortis to evaluate for ablation, felt not to be a candidate with 2 different arrhythmias, a severely dilated LA, mod dilated RA, and recommended ongoing amiodarone tx, NICM, chronic CHF (systolic), severe OSA (untreated, unable to tolerate CPAP), morbid obesity, ESRF on HD, transferred from [facility] to [facility] 04/22/2018 after a syncopal event.    He was unable to be dialyzed yesterday (unable to gain access) with plans to return the following day.  Later while at his girlfriend's house had a syncopal event >> [facility] via EMS  Device information: SJM single chamber ICD, implanted 05/21/15 AAD tx: amiodarone, appears initially for his Atrial arrhythmias + hx of appropriate therapies Interrogation: battery and lead measurements are OK VT episodes 4 failed APps, one shock successful ATP >> another morphology, accelerated as well, 4th accelerated futher to a 3rd morphology >> shock  admitted 04/04/13 lidocaine gtt, PO amiodarone 04/06/18 Sustained VT, failed  ATP > shock 04/06/18 amiodarone gtt started, mexiletine started 04/07/18 lidocaine gtt stopped  Assessment & Plan    1. VT storm  2. 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 Chronic CHF (systolic) 4. ESRF on HD 5. HTN     Relative hypotension 6. Untreated OSA 7. Morbid obesity Nausea    NO VT  Continue current meds  Tolerated HD  Yeah  Signed, Sherryl Manges, MD  04/17/2018, 4:55 PM

## 2018-04-18 ENCOUNTER — Encounter (HOSPITAL_COMMUNITY): Payer: Medicaid Other

## 2018-04-18 ENCOUNTER — Inpatient Hospital Stay (HOSPITAL_COMMUNITY): Payer: Medicaid Other

## 2018-04-18 LAB — CBC WITH DIFFERENTIAL/PLATELET
ABS IMMATURE GRANULOCYTES: 0.3 10*3/uL — AB (ref 0.0–0.1)
Basophils Absolute: 0.1 10*3/uL (ref 0.0–0.1)
Basophils Relative: 0 %
Eosinophils Absolute: 0.1 10*3/uL (ref 0.0–0.7)
Eosinophils Relative: 0 %
HCT: 43.2 % (ref 39.0–52.0)
HEMOGLOBIN: 13.2 g/dL (ref 13.0–17.0)
IMMATURE GRANULOCYTES: 2 %
LYMPHS ABS: 2.1 10*3/uL (ref 0.7–4.0)
LYMPHS PCT: 12 %
MCH: 29.4 pg (ref 26.0–34.0)
MCHC: 30.6 g/dL (ref 30.0–36.0)
MCV: 96.2 fL (ref 78.0–100.0)
MONO ABS: 2.3 10*3/uL — AB (ref 0.1–1.0)
MONOS PCT: 13 %
NEUTROS PCT: 73 %
Neutro Abs: 12.6 10*3/uL — ABNORMAL HIGH (ref 1.7–7.7)
PLATELETS: 286 10*3/uL (ref 150–400)
RBC: 4.49 MIL/uL (ref 4.22–5.81)
RDW: 16.6 % — ABNORMAL HIGH (ref 11.5–15.5)
WBC: 17.4 10*3/uL — ABNORMAL HIGH (ref 4.0–10.5)

## 2018-04-18 LAB — RENAL FUNCTION PANEL
ANION GAP: 15 (ref 5–15)
Albumin: 3.1 g/dL — ABNORMAL LOW (ref 3.5–5.0)
BUN: 62 mg/dL — ABNORMAL HIGH (ref 6–20)
CHLORIDE: 90 mmol/L — AB (ref 98–111)
CO2: 23 mmol/L (ref 22–32)
Calcium: 9 mg/dL (ref 8.9–10.3)
Creatinine, Ser: 7.73 mg/dL — ABNORMAL HIGH (ref 0.61–1.24)
GFR calc non Af Amer: 7 mL/min — ABNORMAL LOW (ref >60)
GFR, EST AFRICAN AMERICAN: 8 mL/min — AB (ref >60)
GLUCOSE: 85 mg/dL (ref 70–99)
Phosphorus: 6.9 mg/dL — ABNORMAL HIGH (ref 2.5–4.6)
Potassium: 5.1 mmol/L (ref 3.5–5.1)
Sodium: 128 mmol/L — ABNORMAL LOW (ref 135–145)

## 2018-04-18 LAB — HEPATITIS B SURFACE ANTIGEN: Hepatitis B Surface Ag: NEGATIVE

## 2018-04-18 MED ORDER — SODIUM CHLORIDE 0.9 % IV SOLN: 100.0000 mL | INTRAVENOUS | Status: DC | PRN

## 2018-04-18 MED ORDER — HEPARIN SODIUM (PORCINE) 1000 UNIT/ML DIALYSIS
1000.0000 [IU] | INTRAMUSCULAR | Status: DC | PRN
  Filled 2018-04-18: qty 1

## 2018-04-18 MED ORDER — LIDOCAINE-PRILOCAINE 2.5-2.5 % EX CREA
1.0000 "application " | TOPICAL_CREAM | CUTANEOUS | Status: DC | PRN
  Filled 2018-04-18: qty 5

## 2018-04-18 MED ORDER — CALCIUM ACETATE (PHOS BINDER) 667 MG PO CAPS
1334.0000 mg | ORAL_CAPSULE | Freq: Three times a day (TID) | ORAL | Status: DC
  Administered 2018-04-18 – 2018-04-20 (×5): 1334 mg via ORAL
  Filled 2018-04-18 (×7): qty 2

## 2018-04-18 MED ORDER — HEPARIN SODIUM (PORCINE) 1000 UNIT/ML IJ SOLN
5000.0000 [IU] | Freq: Once | INTRAMUSCULAR | Status: AC
  Administered 2018-04-18: 5000 [IU] via INTRAVENOUS
  Filled 2018-04-18: qty 5

## 2018-04-18 MED ORDER — PENTAFLUOROPROP-TETRAFLUOROETH EX AERO: 1.0000 "application " | INHALATION_SPRAY | CUTANEOUS | Status: DC | PRN

## 2018-04-18 MED ORDER — LIDOCAINE HCL (PF) 1 % IJ SOLN: 5.0000 mL | INTRAMUSCULAR | Status: DC | PRN

## 2018-04-18 MED ORDER — ALTEPLASE 2 MG IJ SOLR: 2.0000 mg | Freq: Once | INTRAMUSCULAR | Status: DC | PRN

## 2018-04-18 MED ORDER — ENSURE ENLIVE PO LIQD: 237.0000 mL | Freq: Two times a day (BID) | ORAL | Status: DC

## 2018-04-18 MED ORDER — PRO-STAT SUGAR FREE PO LIQD
30.0000 mL | Freq: Two times a day (BID) | ORAL | Status: DC
  Administered 2018-04-18 – 2018-04-23 (×7): 30 mL via ORAL
  Filled 2018-04-18 (×9): qty 30

## 2018-04-18 MED ORDER — ALTEPLASE 2 MG IJ SOLR
2.0000 mg | Freq: Once | INTRAMUSCULAR | Status: AC
  Administered 2018-04-18: 2 mg
  Filled 2018-04-18: qty 2

## 2018-04-18 MED ORDER — NEPRO/CARBSTEADY PO LIQD
237.0000 mL | Freq: Three times a day (TID) | ORAL | Status: DC
  Administered 2018-04-18 – 2018-04-19 (×2): 237 mL via ORAL
  Filled 2018-04-18 (×14): qty 237

## 2018-04-18 NOTE — Progress Notes (Signed)
PT Cancellation Note  Patient Details Name: Eric Murillo MRN: [id number] DOB: Dec 28, 1960   Cancelled Treatment:    Reason Eval/Treat Not Completed: Patient at procedure or test/unavailable(pt initiating HD)   Eric Murillo 04/18/2018, 7:24 AM Eric Murillo, PT Acute Rehabilitation Services Pager: [phone] Office: [phone]

## 2018-04-18 NOTE — Progress Notes (Signed)
Patient ID: Eric Murillo, male   DOB: 05-Nov-1960, 57 y.o.   MRN: [id number]     Advanced Heart Failure Rounding Note  PCP-Cardiologist: [name], MD   Subjective:    Presented to [facility] ED after a syncopal episode. 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 Transferred to [facility] and had another episode of VT in ED that required ATP.   9/18 VT arrest. Amio drip restarted but now back to po. No further arrythmias.   Remains on midodrine 15 mg tid. Tolerated HD OK 04/16/18, back on HD this morning with stable BP.    Still feeling tired with poor appetite.  Has reported pill dysphagia but barium swallow 9/25 was normal.  He had 1 episode of hemoptysis yesterday.   RHC/LHC 04/11/2018  1. Low, but not markedly low, cardiac output.  2. Pulmonary venous hypertension.  3. Markedly elevated right and left heart filling pressures.   4. No significant coronary disease.   RHC Procedural Findings: Hemodynamics (mmHg) RA mean 18 RV 65/17 PA 69/30, mean 47 PCWP mean 36 LV 100/27 AO 97/65 Oxygen saturations: PA 52% AO 94% Cardiac Output (Fick) 4.98  Cardiac Index (Fick) 2.12 PVR 2.2 WU Cardiac Output (Thermo) 4.85 Cardiac Index (Thermo) 2.06  PVR 2.3 WU   Objective:   Weight Range: 111.7 kg Body mass index is 36.37 kg/m.   Vital Signs:   Temp:  [97.3 F (36.3 C)-97.8 F (36.6 C)] 97.8 F (36.6 C) (09/26 0400) Pulse Rate:  [63-132] 65 (09/26 0600) Resp:  [14-22] 16 (09/26 0600) BP: (93-138)/(54-91) 116/55 (09/26 0600) SpO2:  [88 %-100 %] 100 % (09/26 0600) Weight:  [111.7 kg] 111.7 kg (09/26 0556) Last BM Date: 04/15/18  Weight change: Filed Weights   04/14/18 0700 04/15/18 0700 04/18/18 0556  Weight: 114.9 kg 111.7 kg 111.7 kg    Intake/Output:   Intake/Output Summary (Last 24 hours) at 04/18/2018 0803 Last data filed at 04/18/2018 0600 Gross per 24 hour  Intake 120 ml  Output -  Net 120 ml      Physical Exam   General: NAD Neck: JVP  8 cm, no thyromegaly or thyroid nodule.  Lungs: Clear to auscultation bilaterally with normal respiratory effort. CV: Nondisplaced PMI.  Heart regular S1/S2, no S3/S4, no murmur.  1+ ankle edema.  Abdomen: Soft, nontender, no hepatosplenomegaly, no distention.  Skin: Intact without lesions or rashes.  Neurologic: Alert and oriented x 3.  Psych: Normal affect. Extremities: No clubbing or cyanosis.  HEENT: Normal.    Telemetry   NSR 70s with long 1st degree AVB, No further VT, personally reviewed.   Labs    CBC Recent Labs    04/17/18 0322 04/18/18 0350  WBC 19.5* 17.4*  NEUTROABS 14.2* 12.6*  HGB 12.8* 13.2  HCT 42.1 43.2  MCV 96.8 96.2  PLT 269 286   Basic Metabolic Panel Recent Labs    63/01/60 0322 04/18/18 0350  NA 129* 128*  K 4.9 5.1  CL 91* 90*  CO2 25 23  GLUCOSE 92 85  BUN 37* 62*  CREATININE 5.21* 7.73*  CALCIUM 9.0 9.0  PHOS 4.9* 6.9*   Liver Function Tests Recent Labs    04/17/18 0322 04/18/18 0350  ALBUMIN 3.0* 3.1*   No results for input(s): LIPASE, AMYLASE in the last 72 hours. Cardiac Enzymes No results for input(s): CKTOTAL, CKMB, CKMBINDEX, TROPONINI in the last 72 hours.  BNP: BNP (last 3 results) Recent Labs    07/26/17 0916 11/03/17 1238 11/13/17 0400  BNP 399.7* 1,160.1* 1,085.3*    ProBNP (last 3 results) No results for input(s): PROBNP in the last 8760 hours.   D-Dimer No results for input(s): DDIMER in the last 72 hours. Hemoglobin A1C No results for input(s): HGBA1C in the last 72 hours. Fasting Lipid Panel No results for input(s): CHOL, HDL, LDLCALC, TRIG, CHOLHDL, LDLDIRECT in the last 72 hours. Thyroid Function Tests No results for input(s): TSH, T4TOTAL, T3FREE, THYROIDAB in the last 72 hours.  Invalid input(s): FREET3  Other results:   Imaging    Dg Esophagus  Result Date: 04/17/2018 CLINICAL DATA:  Dysphagia with pills. EXAM: ESOPHOGRAM/BARIUM SWALLOW TECHNIQUE: Single contrast examination was  performed using  thin barium. FLUOROSCOPY TIME:  Fluoroscopy Time:  1 minutes 30 seconds Radiation Exposure Index (if provided by the fluoroscopic device): 18.70 Number of Acquired Spot Images: 0 COMPARISON:  None. FINDINGS: The patient swallowed thin barium without difficulty. The esophagus has a normal appearance with no hypopharyngeal abnormalities. No strictures, masses or ulcerations were seen. No hiatal hernia or gastroesophageal reflux demonstrated during the examination. The patient was unable to swallow a barium tablet. IMPRESSION: Normal examination. The patient was unable to swallow a barium tablet. Electronically Signed   By: Beckie Salts M.D.   On: 04/17/2018 10:30     Medications:     Scheduled Medications: . allopurinol  100 mg Oral BID  . amiodarone  200 mg Oral BID  . apixaban  5 mg Oral BID  . calcitRIOL  0.5 mcg Oral Q T,Th,Sa-HD  . Chlorhexidine Gluconate Cloth  6 each Topical Daily  . Chlorhexidine Gluconate Cloth  6 each Topical Q0600  . Chlorhexidine Gluconate Cloth  6 each Topical Q0600  . feeding supplement (ENSURE ENLIVE)  237 mL Oral BID BM  . mexiletine  150 mg Oral Q12H  . midodrine  15 mg Oral TID WC  . multivitamin  1 tablet Oral QHS  . ranolazine  500 mg Oral BID  . sodium chloride flush  10-40 mL Intracatheter Q12H  . sodium chloride flush  3 mL Intravenous Q12H  . sodium chloride flush  3 mL Intravenous Q12H    Infusions: . sodium chloride    . sodium chloride    . sodium chloride      PRN Medications: sodium chloride, sodium chloride, sodium chloride, acetaminophen, bismuth subsalicylate, HYDROcodone-acetaminophen, ondansetron (ZOFRAN) IV, promethazine, senna-docusate, sodium chloride flush, sodium chloride flush, sorbitol    Patient Profile   Eric Murillo is a 57 y.o. male with history of morbid obesity, hyperlipidemia, HTN, atrial flutter (s/p TEE DC-CV 04/03/14 and again in 8/17), NICM with chronic systolic HF and severe OSA but unable to  tolerate CPAP.    Assessment/Plan   1. Recurrent VT requiring shock and ATP: Follows with Dr Graciela Husbands. EP has been consulted. Last saw Dr Graciela Husbands 04/03/18 and was having recurrent episodes of VF. Mexilitine was added in addition to his amio.  Coronary angiography this admission showed no CAD. Cardiac output was low but not markedly so.  Concern for VT as sign of end stage cardiomyopathy.  No further VT over the last few days.  - On po amiodarone, mexilitene and Ranexa, Dr Graciela Husbands following.  2. Chronic systolic heart failure: Nonischemic cardiomyopathy, likely related to HTN.  Echo in 8/14 with EF 45% but EF down to 25-30% on TEE while in atrial flutter (03/2014).  Echo (1/16) with EF ~25% and echo in 6/16 with EF 30-35%.  No cardiac MRI done with elevated creatinine and size.  There was concern for cardiac amyloidosis.  However, negative SPEP and abdominal fat pad biopsy negative. Low voltage on ECG may be due to obesity and not amyloidosis. Echo 10/2017: EF 20-25%, mild to mod MR. S/P [employer] ICD.  Marked volume overload by RHC this admission.  Unable get fluid off with HD initially due to hypotension and required CVVH.  However, now tolerating iHD on midodrine.  - iHD today for volume management.  - Continue midodrine 15 mg tid.  - He has end stage HF, not candidate for LVAD and unlikely to be able to get to heart/kidney transplant.  He is currently tolerating iHD so we hope to get him to CIR and then home.  3. Atypical Atrial Flutter: Recurrent.  S/p several DCCVs, most recently 11/21/17. Has been seen by EP. Not felt to be amenable to ablation. He is in NSR with long PR interval.  - Continue on Eliqus 5 mg BID 4. ESRD: Now on HD T/R/Sat.  Intolerant iHD earlier this admission, hypotensive when trying to pull off excess fluid and had VT. He initially required norepinephrine and CVVH, now back to iHD and tolerating.  5. OSA: Does not tolerate CPAP.  6. Morbid Obesity:   Body mass index is 36.37 kg/m.  7. ID:  Afebrile, WBCs have been elevated but lower today.  Reports episode hemoptysis last night.  - Will order CXR today.    8. Deconditioning - PT following.   - I think he is ready for CIR.  9. Pill dysphagia: Barium swallow 9/25 was normal.   He is doing well on HD.  Think he can go to CIR when there is a bed.    Length of Stay: 27  [name], MD  04/18/2018, 8:03 AM  Advanced Heart Failure Team Pager [phone] (M-F; 7a - 4p)  Please contact [facility] for night-coverage after hours (4p -7a ) and weekends on [email or web address]

## 2018-04-18 NOTE — Consult Note (Addendum)
Chief Complaint: Patient was seen in consultation today for right upper arm dialysis fistula evaluation- possible angioplasty/stent; possible thrombolysis. Chief Complaint  Patient presents with  . Loss of Consciousness   at the request of Dr Eddie North   Supervising Physician: Simonne Come  Patient Status: [facility] - In-pt  History of Present Illness: Eric Murillo is a 57 y.o. male   ESRD Admitted after syncopal episode Pt is sluggish but answers all questions correctly; appropriately RUA fistula slow flow per MD Existing Temp cath in left IJ-- used today in dialysis CRRT 9/18-23  Request for evaluation of fistula in IR Scheduled in IR tomorrow for Possible angioplasty/stent/ thrombolysis Possible placement of tunneled catheter if needed  Past Medical History:  Diagnosis Date  . AICD (automatic cardioverter/defibrillator) present   . Anemia of chronic disease   . Arthritis   . Atrial flutter (HCC)    DCCV 9/15  . Chest pain on exertion   . Chronic systolic congestive heart failure, NYHA class 2 (HCC)    EF 20-25% 1/16  . CKD (chronic kidney disease) stage 3, GFR 30-59 ml/min (HCC)   . Dyslipidemia   . Gout    Of big toe  . Hyperglycemia   . Hyperlipidemia   . Hypertension   . Morbid obesity with BMI of 45.0-49.9, adult (HCC)   . Non-ischemic cardiomyopathy (HCC)   . Organic erectile dysfunction   . OSA (obstructive sleep apnea)   . Pilonidal cyst   . Submandibular sialolithiasis    Right    Past Surgical History:  Procedure Laterality Date  . AV FISTULA PLACEMENT Right 11/12/2017   Procedure: CREATION of RIGHT BRACHIAL CEPHALIC ARTERIOVENOUS (AV) FISTULA;  Surgeon: Sherren Kerns, MD;  Location: [facility] OR;  Service: Vascular;  Laterality: Right;  . CARDIAC CATHETERIZATION  2012  . CARDIOVERSION N/A 04/03/2014   Procedure: CARDIOVERSION;  Surgeon: Laurey Morale, MD;  Location: [facility] ENDOSCOPY;  Service: Cardiovascular;  Laterality: N/A;  . CARDIOVERSION N/A  03/09/2016   Procedure: CARDIOVERSION;  Surgeon: Laurey Morale, MD;  Location: [facility] ENDOSCOPY;  Service: Cardiovascular;  Laterality: N/A;  . CARDIOVERSION N/A 06/06/2017   Procedure: CARDIOVERSION;  Surgeon: Laurey Morale, MD;  Location: [facility] ENDOSCOPY;  Service: Cardiovascular;  Laterality: N/A;  . CARDIOVERSION N/A 11/21/2017   Procedure: CARDIOVERSION;  Surgeon: Laurey Morale, MD;  Location: [facility] ENDOSCOPY;  Service: Cardiovascular;  Laterality: N/A;  . COLONOSCOPY    . EP IMPLANTABLE DEVICE N/A 05/21/2015   Procedure: ICD Implant;  Surgeon: Duke Salvia, MD;  Location: [facility] INVASIVE CV LAB;  Service: Cardiovascular;  Laterality: N/A;  . INSERTION OF DIALYSIS CATHETER Right 11/12/2017   Procedure: PLACEMENT of right internal jugualr tunneled DIALYSIS CATHETER with removal or temporary dialysis catheter;  Surgeon: Sherren Kerns, MD;  Location: [facility] OR;  Service: Vascular;  Laterality: Right;  . IR FLUORO GUIDE CV LINE RIGHT  11/08/2017  . IR US GUIDE VASC ACCESS RIGHT  11/08/2017  . LEFT AND RIGHT HEART CATHETERIZATION WITH CORONARY ANGIOGRAM N/A 04/24/2014   Procedure: LEFT AND RIGHT HEART CATHETERIZATION WITH CORONARY ANGIOGRAM;  Surgeon: Laurey Morale, MD;  Location: [facility] CATH LAB;  Service: Cardiovascular;  Laterality: N/A;  . RIGHT/LEFT HEART CATH AND CORONARY ANGIOGRAPHY N/A 03/28/2018   Procedure: RIGHT/LEFT HEART CATH AND CORONARY ANGIOGRAPHY;  Surgeon: Laurey Morale, MD;  Location: [facility] INVASIVE CV LAB;  Service: Cardiovascular;  Laterality: N/A;  . SALIVARY GLAND SURGERY  09/12/2012  . SUBMANDIBULAR GLAND EXCISION Right 09/12/2012  Procedure: Removal Right Submandibular Larina Bras;  Surgeon: Serena Colonel, MD;  Location: [facility] OR;  Service: ENT;  Laterality: Right;  . TEE WITHOUT CARDIOVERSION N/A 04/03/2014   Procedure: TRANSESOPHAGEAL ECHOCARDIOGRAM (TEE);  Surgeon: Laurey Morale, MD;  Location: [facility] ENDOSCOPY;  Service: Cardiovascular;  Laterality: N/A;  . TEE WITHOUT CARDIOVERSION N/A  03/09/2016   Procedure: TRANSESOPHAGEAL ECHOCARDIOGRAM (TEE);  Surgeon: Laurey Morale, MD;  Location: [facility] ENDOSCOPY;  Service: Cardiovascular;  Laterality: N/A;  . TEE WITHOUT CARDIOVERSION N/A 06/06/2017   Procedure: TRANSESOPHAGEAL ECHOCARDIOGRAM (TEE);  Surgeon: Laurey Morale, MD;  Location: [facility] ENDOSCOPY;  Service: Cardiovascular;  Laterality: N/A;  . TEE WITHOUT CARDIOVERSION N/A 11/21/2017   Procedure: TRANSESOPHAGEAL ECHOCARDIOGRAM (TEE);  Surgeon: Laurey Morale, MD;  Location: [facility] ENDOSCOPY;  Service: Cardiovascular;  Laterality: N/A;    Allergies: Nsaids and Beta adrenergic blockers  Medications: Prior to Admission medications   Medication Sig Start Date End Date Taking? Authorizing Provider  allopurinol (ZYLOPRIM) 100 MG tablet TAKE 1 TABLET BY MOUTH TWO TIMES DAILY Patient taking differently: Take 100 mg by mouth 2 (two) times daily.  02/07/18  Yes Inez Catalina, MD  amiodarone (PACERONE) 200 MG tablet Take 1 tablet (200 mg total) by mouth 2 (two) times daily. 04/03/18  Yes Duke Salvia, MD  amoxicillin (AMOXIL) 500 MG capsule Take 500 mg by mouth 3 (three) times daily. 7 DAY COURSE STARTING ON 03/29/2018   Yes [provider]  apixaban (ELIQUIS) 2.5 MG TABS tablet Take 1 tablet (2.5 mg total) by mouth 2 (two) times daily. 03/13/18  Yes Levert Feinstein, MD  bismuth subsalicylate (PEPTO BISMOL) 262 MG chewable tablet Chew 524 mg by mouth daily as needed for indigestion.   Yes [provider]  calcitRIOL (ROCALTROL) 0.5 MCG capsule Take 1 capsule (0.5 mcg total) by mouth every other day. Patient taking differently: Take 0.5 mcg by mouth Every Tuesday,Thursday,and Saturday with dialysis.  11/24/17  Yes Alford Highland, NP  calcium acetate (PHOSLO) 667 MG capsule TAKE 2 CAPSULES (1,334 MG TOTAL) BY MOUTH 3 (THREE) TIMES DAILY WITH MEALS. 03/14/18  Yes Levert Feinstein, MD  carvedilol (COREG) 3.125 MG tablet Take 1 tablet (3.125 mg total) by mouth 2 (two) times  daily with a meal. Give ONLY on non-HD days. Hold on dialysis days. 11/22/17  Yes Alford Highland, NP  diclofenac sodium (VOLTAREN) 1 % GEL Apply 4 g topically 4 (four) times daily. Patient taking differently: Apply 4 g topically 4 (four) times daily as needed (for pain at affected sites).  02/07/17  Yes Charlsie Quest, MD  HYDROcodone-acetaminophen (NORCO/VICODIN) 5-325 MG tablet Take 1-2 tablets by mouth every 4 (four) hours as needed. 03/02/18  Yes Ivery Quale, PA-C  mexiletine (MEXITIL) 200 MG capsule Take 1 capsule (200 mg total) by mouth 2 (two) times daily. 04/03/18  Yes Duke Salvia, MD  multivitamin (RENA-VIT) TABS tablet Take 1 tablet by mouth at bedtime. 11/22/17  Yes Alford Highland, NP  clindamycin (CLEOCIN) 300 MG capsule Take 1 capsule (300 mg total) by mouth 3 (three) times daily. Patient not taking: Reported on 05-02-2018 03/02/18   Ivery Quale, PA-C     Family History  Problem Relation Age of Onset  . Hypertension Mother   . Hypertension Father   . Cancer Father        brother died of brain cancer; sister died of bone cancer  . Diabetes Sister   . Diabetes Brother   . Colon cancer Maternal  Aunt   . Cardiomyopathy Neg Hx     Social History   Socioeconomic History  . Marital status: Single    Spouse name: Not on file  . Number of children: Not on file  . Years of education: Not on file  . Highest education level: Not on file  Occupational History  . Not on file  Social Needs  . Financial resource strain: Not on file  . Food insecurity:    Worry: Not on file    Inability: Not on file  . Transportation needs:    Medical: Not on file    Non-medical: Not on file  Tobacco Use  . Smoking status: Never Smoker  . Smokeless tobacco: Never Used  Substance and Sexual Activity  . Alcohol use: No    Alcohol/week: 0.0 standard drinks  . Drug use: No  . Sexual activity: Not on file  Lifestyle  . Physical activity:    Days per week: Not on file    Minutes per  session: Not on file  . Stress: Not on file  Relationships  . Social connections:    Talks on phone: Not on file    Gets together: Not on file    Attends religious service: Not on file    Active member of club or organization: Not on file    Attends meetings of clubs or organizations: Not on file    Relationship status: Not on file  Other Topics Concern  . Not on file  Social History Narrative   Financial assistance approved for 100% discount at [facility] and has [facility] card   [employer]  March 16, 2010 9:42 AM      Lives with mother.  Has a girlfriend   Review of Systems: A 12 point ROS discussed and pertinent positives are indicated in the HPI above.  All other systems are negative.  Review of Systems  Constitutional: Positive for activity change, appetite change and fatigue. Negative for fever.  Respiratory: Negative for shortness of breath.   Musculoskeletal: Positive for gait problem.  Neurological: Positive for weakness.  Psychiatric/Behavioral: Negative for behavioral problems and confusion.    Vital Signs: BP 102/71 (BP Location: Left Leg)   Pulse 78   Temp (!) 97.4 F (36.3 C) (Oral)   Resp 17   Ht 5\' 9"  (1.753 m)   Wt 235 lb 14.3 oz (107 kg)   SpO2 100%   BMI 34.84 kg/m   Physical Exam  Cardiovascular: Normal rate and regular rhythm.  Pulmonary/Chest: Effort normal. He has wheezes.  Abdominal: Soft. Bowel sounds are normal.  Musculoskeletal: Normal range of motion.  Right arm fistula: weak pulse Weak thrill  Neurological:  Answers all questions correctly and appropriately Follows all commands Slow to react; slow to respond-- pt is sluggish Speech is clear  Skin: Skin is warm and dry.  Psychiatric: He has a normal mood and affect. His behavior is normal. Judgment and thought content normal.  Vitals reviewed.   Imaging: Dg Esophagus  Result Date: 04/17/2018 CLINICAL DATA:  Dysphagia with pills. EXAM: ESOPHOGRAM/BARIUM SWALLOW TECHNIQUE: Single contrast  examination was performed using  thin barium. FLUOROSCOPY TIME:  Fluoroscopy Time:  1 minutes 30 seconds Radiation Exposure Index (if provided by the fluoroscopic device): 18.70 Number of Acquired Spot Images: 0 COMPARISON:  None. FINDINGS: The patient swallowed thin barium without difficulty. The esophagus has a normal appearance with no hypopharyngeal abnormalities. No strictures, masses or ulcerations were seen. No hiatal hernia or gastroesophageal reflux demonstrated  during the examination. The patient was unable to swallow a barium tablet. IMPRESSION: Normal examination. The patient was unable to swallow a barium tablet. Electronically Signed   By: Beckie Salts M.D.   On: 04/17/2018 10:30   Dg Chest Port 1 View  Result Date: 04/18/2018 CLINICAL DATA:  Hemoptysis, dialysis EXAM: PORTABLE CHEST 1 VIEW COMPARISON:  Portable exam 0838 hours compared to 04/12/2018 FINDINGS: LEFT subclavian AICD lead projects over RIGHT ventricle. RIGHT jugular dual-lumen central venous catheter with tip projecting over SVC. Borderline enlargement of cardiac silhouette with slight pulmonary vascular congestion. Mediastinal contours normal. Elevated RIGHT diaphragm with RIGHT basilar atelectasis. Lungs clear. No infiltrate, pleural effusion or pneumothorax. IMPRESSION: RIGHT basilar atelectasis. Electronically Signed   By: Ulyses Southward M.D.   On: 04/18/2018 09:50   Dg Chest Port 1 View  Result Date: 04/12/2018 CLINICAL DATA:  Decreased breath sounds EXAM: PORTABLE CHEST 1 VIEW COMPARISON:  04/11/2018 FINDINGS: Cardiac shadow remains enlarged. Defibrillator is again noted. Left jugular temporary dialysis catheter is seen. The lungs are well aerated bilaterally with mild right basilar atelectasis stable from the previous exam. Elevation of the right hemidiaphragm is again noted and stable. No bony abnormality is seen. IMPRESSION: Mild right basilar atelectasis stable from the prior exam. Electronically Signed   By: Alcide Clever  M.D.   On: 04/12/2018 10:02   Dg Chest Port 1 View  Result Date: 04/11/2018 CLINICAL DATA:  Shortness of breath. History of CHF, cardiac dysrhythmias, end-stage renal disease, morbid obesity. EXAM: PORTABLE CHEST 1 VIEW COMPARISON:  Portable chest x-ray of April 10, 2018 FINDINGS: The right lung is mildly hypoinflated secondary to an elevated hemidiaphragm which is a chronic finding. The left lung is better inflated. The cardiac silhouette is enlarged. The pulmonary vascularity is not engorged. The ICD is in stable position. The dual-lumen dialysis catheter tip projects over the midportion of the SVC. IMPRESSION: Mild chronic hypoinflation on the right. No alveolar pneumonia nor pulmonary edema. Stable enlargement of cardiac silhouette without significant pulmonary vascular congestion. Electronically Signed   By: David  [place]and M.D.   On: 04/11/2018 10:24   Dg Chest Port 1 View  Result Date: 04/10/2018 CLINICAL DATA:  Central line placement. EXAM: PORTABLE CHEST 1 VIEW COMPARISON:  04/06/2018 and 04/13/2018 FINDINGS: Left jugular vein catheter has been inserted. The tip is at the cavoatrial junction in good position. No pneumothorax. Chronic mild cardiomegaly. AICD in place. Pulmonary vascularity is normal. Chronic elevation of the right hemidiaphragm. No discrete infiltrates or effusions. IMPRESSION: No acute abnormalities.  New central line appears in good position. Electronically Signed   By: Francene Boyers M.D.   On: 04/10/2018 15:07   Dg Chest Port 1 View  Result Date: 04/06/2018 CLINICAL DATA:  Central line plmt EXAM: PORTABLE CHEST 1 VIEW COMPARISON:  03/27/2018 FINDINGS: Pacer/AICD device. Interval placement of a left internal jugular line. This terminates at the high SVC. Moderate right hemidiaphragm elevation. Midline trachea. Mild cardiomegaly. No pleural effusion or pneumothorax. Suspect mild pulmonary venous congestion. No lobar consolidation. IMPRESSION: Left internal jugular line tip at  high SVC; no pneumothorax. Cardiomegaly with developing mild pulmonary venous congestion. Electronically Signed   By: Jeronimo Greaves M.D.   On: 04/06/2018 02:28   Dg Chest Port 1 View  Result Date: 03/28/2018 CLINICAL DATA:  Patient states he has had syncopal episodes today and jerked and felt like his ICD went off. Hx of AICD, atrial flutter, HTN, and cardiac cath. EXAM: PORTABLE CHEST 1 VIEW COMPARISON:  Chest x-ray  dated 03/26/2018. FINDINGS: Stable cardiomegaly. Lungs are clear. No pleural effusion or pneumothorax. LEFT chest wall pacemaker apparatus appears stable. Osseous structures about the chest are unremarkable. IMPRESSION: No active disease.  Stable cardiomegaly. Electronically Signed   By: Bary Richard M.D.   On: 04/13/2018 20:32   Dg Chest Portable 1 View  Result Date: 03/26/2018 CLINICAL DATA:  Shortness of breath and weakness while watching television tonight, now resolved. History of end-stage renal disease on dialysis, CHF. EXAM: PORTABLE CHEST 1 VIEW COMPARISON:  Chest radiograph January 01, 2018 FINDINGS: Stable cardiomegaly. Low inspiratory examination. Pulmonary vasculature appears normal. Mildly elevated RIGHT hemidiaphragm without pleural effusion or focal consolidation. RIGHT costophrenic angle incompletely imaged. No pneumothorax. LEFT AICD in situ. Stable appearance of tunneled dialysis catheter via RIGHT internal jugular venous approach with distal tip projecting in distal superior vena cava. Soft tissue planes and included osseous structures are unchanged. IMPRESSION: Stable cardiomegaly.  No acute pulmonary process. Electronically Signed   By: Awilda Metro M.D.   On: 03/26/2018 05:35    Labs:  CBC: Recent Labs    04/14/18 0413 04/16/18 0417 04/17/18 0322 04/18/18 0350  WBC 19.8* 19.4* 19.5* 17.4*  HGB 12.4* 12.6* 12.8* 13.2  HCT 41.2 42.2 42.1 43.2  PLT 245 246 269 286    COAGS: Recent Labs    11/12/17 1104 11/12/17 1929  11/15/17 0341  03/26/18 0521   04-26-18 0430 04/13/18 0833 04/14/18 0413 04/15/18 0328  INR 1.53 1.70  --  1.35  --  1.61  --   --   --   --   --   APTT  --   --    < > 58*   < > 34   < > 99* 41* 41* 38*   < > = values in this interval not displayed.    BMP: Recent Labs    04/15/18 0328 04/16/18 0417 04/17/18 0322 04/18/18 0350  NA 134* 130* 129* 128*  K 4.1 5.7* 4.9 5.1  CL 97* 94* 91* 90*  CO2 26 21* 25 23  GLUCOSE 92 97 92 85  BUN 16 46* 37* 62*  CALCIUM 8.7* 9.3 9.0 9.0  CREATININE 1.98* 5.75* 5.21* 7.73*  GFRNONAA 36* 10* 11* 7*  GFRAA 41* 11* 13* 8*    LIVER FUNCTION TESTS: Recent Labs    11/19/17 0621  12/03/17 1028 12/13/17 1517 03/26/18 0521  04/15/18 0328 04/16/18 0417 04/17/18 0322 04/18/18 0350  BILITOT 1.4*  --  1.0 1.1 1.2  --   --   --   --   --   AST 43*  --  47* 46* 34  --   --   --   --   --   ALT 64*  --  49 41 32  --   --   --   --   --   ALKPHOS 170*  --  165* 122 110  --   --   --   --   --   PROT 8.0  --  7.6 7.7 7.4  --   --   --   --   --   ALBUMIN 3.3*  3.3*   < > 3.6 3.9 3.7   < > 3.2* 3.0* 3.0* 3.1*   < > = values in this interval not displayed.    TUMOR MARKERS: No results for input(s): AFPTM, CEA, CA199, CHROMGRNA in the last 8760 hours.  Assessment and Plan:  ESRD Slow flow RUA fistula Scheduled for fistulogram  with possible angioplasty/stent/thrombolysis and possible tunneled dialysis catheter placement if needed Risks and benefits discussed with the patient including, but not limited to bleeding, infection, vascular injury, pulmonary embolism, need for tunneled HD catheter placement or even death.  All of the patient's questions were answered, patient is agreeable to proceed. Consent signed and in chart.    Thank you for this interesting consult.  I greatly enjoyed meeting Eric Murillo and look forward to participating in their care.  A copy of this report was sent to the requesting provider on this date.  Electronically Signed: Robet Leu,  PA-C 04/18/2018, 3:47 PM   I spent a total of 20 Minutes    in face to face in clinical consultation, greater than 50% of which was counseling/coordinating care for fistulogram with possible intervention

## 2018-04-18 NOTE — Progress Notes (Addendum)
Pt had an episode of  hemoptysis (small amount) paged Cardiology about holding eliquis    Per Dr Mayford Knife hold eliquis for further evaluation 04/19/18 of hemoptysis

## 2018-04-18 NOTE — Progress Notes (Signed)
Initial Nutrition Assessment  DOCUMENTATION CODES:   Non-severe (moderate) malnutrition in context of chronic illness, Obesity unspecified  INTERVENTION:   - Nepro Shake po TID, each supplement provides 425 kcal and 19 grams protein  - Pro-stat 30 ml BID, each supplement provides 100 kcal and 15 grams of protein  - Continue renal MVI daily  - Continue to encourage adequate PO intake  - Will provide renal diet education at follow-up  - d/c Ensure Enlive  NUTRITION DIAGNOSIS:   Moderate Malnutrition related to chronic illness (ESRD on HD, CHF) as evidenced by mild fat depletion, mild muscle depletion, percent weight loss (11.6% weight loss in < 5 months).  GOAL:   Patient will meet greater than or equal to 90% of their needs  MONITOR:   PO intake, Supplement acceptance, Labs, I & O's, Weight trends  REASON FOR ASSESSMENT:   LOS, Other (verbal request for renal diet education)    ASSESSMENT:   57 year old male who presented to the ED on 9/12 with syncope. PMH significant for CHF, ESRD on HD, hyperlipidemia, hypertension, and OSA.  Pt with several episodes of VT since admission. Pt was not tolerating iHD and CRRT started 9/18. CRRT d/c on 9/23 and iHD restarted on 9/24. Palliative care team following. Noted pt likely to d/c to CIR.  Pt receiving iHD at time of RD visit. Most recent iHD prior today was on 9/24 with 3411 ml net UF.  Spoke with pt and girlfriend at bedside. Pt appeared very tired and answered questions softly and briefly.  HD RN in pt's room monitoring iHD at time of visit. Discussed pt with main RN who stated it was fine to provide pt with a Nepro oral nutrition supplement. Pt's girlfriend reports that pt will likely wait to drink it once it is determined whether or not he will go to surgery today for clotted fistula.  Pt reports that he has had a very poor appetite and/or "no appetite" since admission. Pt reports that his appetite PTA was "pretty good" and  that they number of meals pt ate daily "varies." Pt reports that he has been tolerating fruit and seems to prefer it over other foods. Noted untouched breakfast meal tray in pt's room at time of visit. Pt's girlfriend states that pt does not typically eat during dialysis.  Pt is amenable to receiving oral nutrition supplements between meals during admission. Discussed importance of adequate kcal and protein intake in maintaining lean muscle mass. Encouraged PO intake at meals. Pt expressed understanding.  Pt reports that his dry weight PTA was 112 kg. Noted weight today of 111.7 kg prior to iHD and post-dialysis weight of 107 kg. Per Nephrology notes, plan is to decrease pt's goal dry weight. Pt reports that prior to weight loss, he weighed around 350 lbs. Pt with 31 lb weight loss since 11/22/17 which is an 11.6% weight loss and is significant for timeframe.  Noted pt started iHD about 6 months PTA. Suspect some of weight fluctuations since January are related to fluid status and intiation of iHD. However, pt has had progressive and continued weight loss since May. Pt's weight does not appear to have stabilized and continues to decrease.  Per I/O's, pt is -18 L since admit. Pt also with 39 lb weight loss since admit.  Pt states that he has spoke with the RD at his dialysis facility regarding nutritional recommendations for ESRD on HD. Pt requested RD to return later for diet education.  Wt Readings from Last  20 Encounters:  04/18/18 107 kg  04/03/18 125 kg  03/20/18 126.2 kg  03/02/18 120.7 kg  01/16/18 121.1 kg  01/08/18 127.1 kg  01/01/18 122 kg  12/27/17 125.5 kg  12/25/17 120.2 kg  12/13/17 122 kg  12/03/17 122.8 kg  11/28/17 122.7 kg  11/22/17 121 kg  10/11/17 (!) 144.7 kg  08/30/17 (!) 148.1 kg  08/17/17 (!) 143.8 kg  08/03/17 (!) 148.5 kg  08/01/17 (!) 148.4 kg  07/31/17 (!) 145.2 kg  07/26/17 (!) 145.6 kg   Meal Completion: 25-75% since 9/21  Medications reviewed and  include: calcitriol TTS, Phoslo TID, Ensure Enlive BID, rena-vit daily  Labs reviewed: sodium 128 (L), chloride 90 (L), BUN 62 (H), creatinine 7.73 (H), phosphorus 6.9 (H)  NUTRITION - FOCUSED PHYSICAL EXAM:    Most Recent Value  Orbital Region  Mild depletion  Upper Arm Region  Mild depletion  Thoracic and Lumbar Region  No depletion  Buccal Region  No depletion  Temple Region  Mild depletion  Clavicle Bone Region  Mild depletion  Clavicle and Acromion Bone Region  Mild depletion  Scapular Bone Region  Unable to assess  Dorsal Hand  No depletion  Patellar Region  No depletion  Anterior Thigh Region  No depletion  Posterior Calf Region  Mild depletion  Edema (RD Assessment)  Mild [generalized]  Hair  Reviewed  Eyes  Reviewed  Mouth  Reviewed  Skin  Reviewed  Nails  Reviewed       Diet Order:   Diet Order            Diet renal/carb modified with fluid restriction Diet-HS Snack? Nothing; Fluid restriction: 1200 mL Fluid; Room service appropriate? Yes; Fluid consistency: Thin  Diet effective now              EDUCATION NEEDS:   Not appropriate for education at this time  Skin:  Skin Assessment: Reviewed RN Assessment  Last BM:  04/15/18 - medium type 1  Height:   Ht Readings from Last 1 Encounters:  04/05/18 5\' 9"  (1.753 m)    Weight:   Wt Readings from Last 1 Encounters:  04/18/18 107 kg    Ideal Body Weight:  72.73 kg  BMI:  Body mass index is 34.84 kg/m.  Estimated Nutritional Needs:   Kcal:  2300-2500  Protein:  125-140 grams  Fluid:  UOP + 1000 ml    Earma Reading, MS, RD, LDN Inpatient Clinical Dietitian Pager: [phone] Weekend/After Hours: [phone]

## 2018-04-18 NOTE — Progress Notes (Signed)
[facility] NEPHROLOGY PROGRESS NOTE  Assessment/ Plan: Pt is a 57 y.o. yo male ESRD on HD TTS, with syncopal episode, recurrent VT requiring shock.  HD: [facility]/ TTS 4.5h  122.5 kg  2/2 bath Hep none  RUA AVF - calc 0.5 ug tiw - venofer 7/10 completed 100 mg qhd  #Recurrent VT, chronic systolic heart failure with EF of 25 to 30%, nonischemic cardiomyopathy: Filling pressures very high on right heart cath.  Currently on amiodarone, mexiletine.  He has no chest pain or shortness of breath today.  Cardiology following  # ESRD: HD TTS.  He was on CRRT from 9/18-9/23. He has intermittent hemodialysis on 9/24, tolerated well with no VT.  Blood pressure acceptable.  Dialysis today via temp catheter. The RUE AVF has minimal thrill and bruit with no radiation. Discussed with dialysis nurse, order fistulogram.  -likely dc to CIR.   # Anemia: Hemoglobin acceptable.   # Secondary hyperparathyroidism: Phosphorus 6.9.  resume PhosLo.  # HTN/volume: On midodrine 15 mg 3 times daily. Monitor BP.  Subjective: Seen and examined at bedside.  Receiving dialysis currently.  Denies chest pain, shortness of breath, nausea vomiting. Objective Vital signs in last 24 hours: Vitals:   04/18/18 0720 04/18/18 0800 04/18/18 0900 04/18/18 0915  BP:  (!) 109/59 (!) 106/59 (!) 88/48  Pulse:  69    Resp:  16    Temp: (!) 97.5 F (36.4 C)     TempSrc: Oral     SpO2:  100%    Weight: 111.7 kg     Height:       Weight change:   Intake/Output Summary (Last 24 hours) at 04/18/2018 0923 Last data filed at 04/18/2018 0600 Gross per 24 hour  Intake 120 ml  Output -  Net 120 ml       Labs: Basic Metabolic Panel: Recent Labs  Lab 04/16/18 0417 04/17/18 0322 04/18/18 0350  NA 130* 129* 128*  K 5.7* 4.9 5.1  CL 94* 91* 90*  CO2 21* 25 23  GLUCOSE 97 92 85  BUN 46* 37* 62*  CREATININE 5.75* 5.21* 7.73*  CALCIUM 9.3 9.0 9.0  PHOS 4.9* 4.9* 6.9*   Liver Function Tests: Recent  Labs  Lab 04/16/18 0417 04/17/18 0322 04/18/18 0350  ALBUMIN 3.0* 3.0* 3.1*   No results for input(s): LIPASE, AMYLASE in the last 168 hours. No results for input(s): AMMONIA in the last 168 hours. CBC: Recent Labs  Lab 04/13/18 0435 04/14/18 0413 04/16/18 0417 04/17/18 0322 04/18/18 0350  WBC 21.2* 19.8* 19.4* 19.5* 17.4*  NEUTROABS 17.6* 15.1* 14.4* 14.2* 12.6*  HGB 11.6* 12.4* 12.6* 12.8* 13.2  HCT 37.7* 41.2 42.2 42.1 43.2  MCV 96.4 98.6 98.4 96.8 96.2  PLT 231 245 246 269 286   Cardiac Enzymes: No results for input(s): CKTOTAL, CKMB, CKMBINDEX, TROPONINI in the last 168 hours. CBG: No results for input(s): GLUCAP in the last 168 hours.  Iron Studies: No results for input(s): IRON, TIBC, TRANSFERRIN, FERRITIN in the last 72 hours. Studies/Results: Dg Esophagus  Result Date: 04/17/2018 CLINICAL DATA:  Dysphagia with pills. EXAM: ESOPHOGRAM/BARIUM SWALLOW TECHNIQUE: Single contrast examination was performed using  thin barium. FLUOROSCOPY TIME:  Fluoroscopy Time:  1 minutes 30 seconds Radiation Exposure Index (if provided by the fluoroscopic device): 18.70 Number of Acquired Spot Images: 0 COMPARISON:  None. FINDINGS: The patient swallowed thin barium without difficulty. The esophagus has a normal appearance with no hypopharyngeal abnormalities. No strictures, masses or ulcerations were seen.  No hiatal hernia or gastroesophageal reflux demonstrated during the examination. The patient was unable to swallow a barium tablet. IMPRESSION: Normal examination. The patient was unable to swallow a barium tablet. Electronically Signed   By: Beckie Salts M.D.   On: 04/17/2018 10:30    Medications: Infusions: . sodium chloride    . sodium chloride    . sodium chloride    . sodium chloride    . sodium chloride      Scheduled Medications: . allopurinol  100 mg Oral BID  . alteplase  2 mg Intracatheter Once  . amiodarone  200 mg Oral BID  . apixaban  5 mg Oral BID  . calcitRIOL   0.5 mcg Oral Q T,Th,Sa-HD  . Chlorhexidine Gluconate Cloth  6 each Topical Daily  . Chlorhexidine Gluconate Cloth  6 each Topical Q0600  . Chlorhexidine Gluconate Cloth  6 each Topical Q0600  . feeding supplement (ENSURE ENLIVE)  237 mL Oral BID BM  . mexiletine  150 mg Oral Q12H  . midodrine  15 mg Oral TID WC  . multivitamin  1 tablet Oral QHS  . ranolazine  500 mg Oral BID  . sodium chloride flush  10-40 mL Intracatheter Q12H  . sodium chloride flush  3 mL Intravenous Q12H  . sodium chloride flush  3 mL Intravenous Q12H    have reviewed scheduled and prn medications.  Physical Exam: General: Not in distress, comfortable, sitting on bed Heart:RRR, s1s2 nl Lungs: clear bilateral, no crackles Abdomen:soft, Non-tender, non-distended Extremities: No edema. Dialysis Access: Right upper extremity aVF faint bruit with no radiation.   Eric Murillo 04/18/2018,9:23 AM  LOS: 14 days

## 2018-04-19 LAB — CBC WITH DIFFERENTIAL/PLATELET
ABS IMMATURE GRANULOCYTES: 0.4 10*3/uL — AB (ref 0.0–0.1)
BASOS ABS: 0.1 10*3/uL (ref 0.0–0.1)
Basophils Relative: 0 %
Eosinophils Absolute: 0.1 10*3/uL (ref 0.0–0.7)
Eosinophils Relative: 0 %
HCT: 44.9 % (ref 39.0–52.0)
HEMOGLOBIN: 13.8 g/dL (ref 13.0–17.0)
Immature Granulocytes: 2 %
LYMPHS ABS: 1.5 10*3/uL (ref 0.7–4.0)
LYMPHS PCT: 10 %
MCH: 29.6 pg (ref 26.0–34.0)
MCHC: 30.7 g/dL (ref 30.0–36.0)
MCV: 96.4 fL (ref 78.0–100.0)
MONO ABS: 2.5 10*3/uL — AB (ref 0.1–1.0)
Monocytes Relative: 15 %
NEUTROS ABS: 11.7 10*3/uL — AB (ref 1.7–7.7)
Neutrophils Relative %: 73 %
PLATELETS: 253 10*3/uL (ref 150–400)
RBC: 4.66 MIL/uL (ref 4.22–5.81)
RDW: 17 % — ABNORMAL HIGH (ref 11.5–15.5)
WBC: 16.1 10*3/uL — ABNORMAL HIGH (ref 4.0–10.5)

## 2018-04-19 LAB — RENAL FUNCTION PANEL
ANION GAP: 16 — AB (ref 5–15)
Albumin: 3 g/dL — ABNORMAL LOW (ref 3.5–5.0)
BUN: 48 mg/dL — ABNORMAL HIGH (ref 6–20)
CHLORIDE: 95 mmol/L — AB (ref 98–111)
CO2: 21 mmol/L — AB (ref 22–32)
Calcium: 8.4 mg/dL — ABNORMAL LOW (ref 8.9–10.3)
Creatinine, Ser: 6.64 mg/dL — ABNORMAL HIGH (ref 0.61–1.24)
GFR calc Af Amer: 10 mL/min — ABNORMAL LOW (ref >60)
GFR calc non Af Amer: 8 mL/min — ABNORMAL LOW (ref >60)
Glucose, Bld: 86 mg/dL (ref 70–99)
POTASSIUM: 4.5 mmol/L (ref 3.5–5.1)
Phosphorus: 5.7 mg/dL — ABNORMAL HIGH (ref 2.5–4.6)
Sodium: 132 mmol/L — ABNORMAL LOW (ref 135–145)

## 2018-04-19 LAB — PROTIME-INR
INR: 2.07
PROTHROMBIN TIME: 23.1 s — AB (ref 11.4–15.2)

## 2018-04-19 MED ORDER — MIDODRINE HCL 5 MG PO TABS
10.0000 mg | ORAL_TABLET | Freq: Three times a day (TID) | ORAL | Status: DC
  Administered 2018-04-19 – 2018-04-23 (×11): 10 mg via ORAL
  Filled 2018-04-19 (×11): qty 2

## 2018-04-19 MED ORDER — CHLORHEXIDINE GLUCONATE CLOTH 2 % EX PADS
6.0000 | MEDICATED_PAD | Freq: Every day | CUTANEOUS | Status: DC
  Administered 2018-04-19 – 2018-04-23 (×4): 6 via TOPICAL

## 2018-04-19 NOTE — Progress Notes (Signed)
Physical Therapy Treatment Patient Details Name: Eric Murillo MRN: [id number] DOB: [birth year] Today's Date: 04/19/2018    History of Present Illness Pt is a 57 y.o. yo male with syncopal episode, recurrent VT requiring shock, heart cath 9/16. PMhx: ESRD, NICM, systolic heart failure, ICD    PT Comments    Patient with limited activity tolerance this pm.  Did note some heart arrhythmias with rate up to 140 briefly.  Feel he also is self limited due to affect.  Family present, though and encouraging.  Will continue skilled PT in the acute setting prior to d/c to CIR level rehab.  Follow Up Recommendations  CIR     Equipment Recommendations  None recommended by PT    Recommendations for Other Services       Precautions / Restrictions Precautions Precautions: Fall Restrictions Weight Bearing Restrictions: No    Mobility  Bed Mobility Overal bed mobility: Needs Assistance Bed Mobility: Sit to Supine       Sit to supine: Mod assist   General bed mobility comments: assist to get both feet on bed  Transfers Overall transfer level: Needs assistance Equipment used: Rolling walker (2 wheeled) Transfers: Sit to/from Stand Sit to Stand: Min assist;+2 safety/equipment         General transfer comment: verbal cues to push up from bed with one hand and can hold onto walker with other. Pt also tended to sit without reaching back; stood up to walker x 3 trials  Ambulation/Gait             General Gait Details: no ambulation today due to feeling weak and dizzy in standing, did march in place some about 10 seconds   Stairs             Wheelchair Mobility    Modified Rankin (Stroke Patients Only)       Balance Overall balance assessment: Needs assistance Sitting-balance support: No upper extremity supported;Feet supported Sitting balance-Leahy Scale: Fair Sitting balance - Comments: weakness evident   Standing balance support: Bilateral upper extremity  supported Standing balance-Leahy Scale: Poor Standing balance comment: standing static with min A with walker                            Cognition Arousal/Alertness: Awake/alert Behavior During Therapy: Flat affect Overall Cognitive Status: Within Functional Limits for tasks assessed                                 General Comments: pt focusing on what he can't do what he can do--encouraged pt to think about what he is doing and we can work with him towards doing more      Exercises      General Comments        Pertinent Vitals/Pain Pain Assessment: No/denies pain Faces Pain Scale: Hurts little more Pain Location: neck Pain Descriptors / Indicators: Aching;Tightness Pain Intervention(s): Monitored during session;Repositioned    Home Living                      Prior Function            PT Goals (current goals can now be found in the care plan section) Progress towards PT goals: Not progressing toward goals - comment(feels weaker this pm so no ambulation tolerated)    Frequency    Min 4X/week  PT Plan Current plan remains appropriate    Co-evaluation PT/OT/SLP Co-Evaluation/Treatment: Yes Reason for Co-Treatment: For patient/therapist safety;To address functional/ADL transfers PT goals addressed during session: Mobility/safety with mobility;Strengthening/ROM OT goals addressed during session: ADL's and self-care      AM-PAC PT "6 Clicks" Daily Activity  Outcome Measure  Difficulty turning over in bed (including adjusting bedclothes, sheets and blankets)?: Unable Difficulty moving from lying on back to sitting on the side of the bed? : Unable Difficulty sitting down on and standing up from a chair with arms (e.g., wheelchair, bedside commode, etc,.)?: Unable Help needed moving to and from a bed to chair (including a wheelchair)?: A Little Help needed walking in hospital room?: A Little Help needed climbing 3-5 steps  with a railing? : Total 6 Click Score: 10    End of Session Equipment Utilized During Treatment: Gait belt Activity Tolerance: Patient limited by fatigue Patient left: with call bell/phone within reach;in bed;with bed alarm set;with family/visitor present   PT Visit Diagnosis: Unsteadiness on feet (R26.81)     Time: 7062-3762 PT Time Calculation (min) (ACUTE ONLY): 24 min  Charges:  $Therapeutic Activity: 8-22 mins                     Sheran Lawless, PT Acute Rehabilitation Services [phone] 04/19/2018    [name] 04/19/2018, 5:08 PM

## 2018-04-19 NOTE — Progress Notes (Signed)
Eric Murillo  Assessment/ Plan: Pt is a 57 y.o. yo male ESRD on HD TTS, with syncopal episode, recurrent VT requiring shock.  HD: [facility]/ TTS 4.5h  122.5 kg  2/2 bath Hep none  RUA AVF - calc 0.5 ug tiw - venofer 7/10 completed 100 mg qhd  #Recurrent VT, chronic systolic heart failure with EF of 25 to 30%, nonischemic cardiomyopathy: Filling pressures very high on right heart cath.  Currently on amiodarone, mexiletine.  He has no chest pain or shortness of breath today.  Cardiology following  # ESRD: HD TTS.  He was on CRRT from 9/18-9/23. Last intermittent HD yesterday via temporary catheter, UF 4 L, tolerated well.  IR consulted for right upper extremity AV fistula evaluation for poor flow.  Plan for fistulogram and possibly declot today.  If unable to fix fistula today then patient needs tunneled catheter.  I discussed this with IR.  Plan for next dialysis tomorrow. -likely dc to CIR.   # Anemia: Hemoglobin acceptable.   # Secondary hyperparathyroidism: Phosphorus 5.7.  Continue PhosLo.  # HTN/volume: On midodrine 15 mg 3 times daily. Monitor BP.  Subjective: Seen and examined at bedside.  Transferred out from ICU.  Sitting on bed.  Has generalized weakness.  Denies chest pain, shortness of breath, nausea vomiting.  Patient's family member at bedside.  Objective Vital signs in last 24 hours: Vitals:   04/18/18 1509 04/18/18 2013 04/19/18 0101 04/19/18 0531  BP: 102/71 117/84 105/61 130/88  Pulse: 78 70 66 71  Resp: 17     Temp: (!) 97.4 F (36.3 C) 98 F (36.7 C) 98.4 F (36.9 C) 98 F (36.7 C)  TempSrc: Oral Oral Oral Oral  SpO2: 100% 99% 98% 100%  Weight:    108.8 kg  Height:       Weight change: 0 kg  Intake/Output Summary (Last 24 hours) at 04/19/2018 0940 Last data filed at 04/18/2018 1600 Gross per 24 hour  Intake 0 ml  Output 4000 ml  Net -4000 ml       Labs: Basic Metabolic Panel: Recent Labs  Lab  04/17/18 0322 04/18/18 0350 04/19/18 0500  NA 129* 128* 132*  K 4.9 5.1 4.5  CL 91* 90* 95*  CO2 25 23 21*  GLUCOSE 92 85 86  BUN 37* 62* 48*  CREATININE 5.21* 7.73* 6.64*  CALCIUM 9.0 9.0 8.4*  PHOS 4.9* 6.9* 5.7*   Liver Function Tests: Recent Labs  Lab 04/17/18 0322 04/18/18 0350 04/19/18 0500  ALBUMIN 3.0* 3.1* 3.0*   No results for input(s): LIPASE, AMYLASE in the last 168 hours. No results for input(s): AMMONIA in the last 168 hours. CBC: Recent Labs  Lab 04/14/18 0413 04/16/18 0417 04/17/18 0322 04/18/18 0350 04/19/18 0500  WBC 19.8* 19.4* 19.5* 17.4* 16.1*  NEUTROABS 15.1* 14.4* 14.2* 12.6* 11.7*  HGB 12.4* 12.6* 12.8* 13.2 13.8  HCT 41.2 42.2 42.1 43.2 44.9  MCV 98.6 98.4 96.8 96.2 96.4  PLT 245 246 269 286 253   Cardiac Enzymes: No results for input(s): CKTOTAL, CKMB, CKMBINDEX, TROPONINI in the last 168 hours. CBG: No results for input(s): GLUCAP in the last 168 hours.  Iron Studies: No results for input(s): IRON, TIBC, TRANSFERRIN, FERRITIN in the last 72 hours. Studies/Results: Dg Esophagus  Result Date: 04/17/2018 CLINICAL DATA:  Dysphagia with pills. EXAM: ESOPHOGRAM/BARIUM SWALLOW TECHNIQUE: Single contrast examination was performed using  thin barium. FLUOROSCOPY TIME:  Fluoroscopy Time:  1 minutes 30 seconds Radiation Exposure  Index (if provided by the fluoroscopic device): 18.70 Number of Acquired Spot Images: 0 COMPARISON:  None. FINDINGS: The patient swallowed thin barium without difficulty. The esophagus has a normal appearance with no hypopharyngeal abnormalities. No strictures, masses or ulcerations were seen. No hiatal hernia or gastroesophageal reflux demonstrated during the examination. The patient was unable to swallow a barium tablet. IMPRESSION: Normal examination. The patient was unable to swallow a barium tablet. Electronically Signed   By: Beckie Salts M.D.   On: 04/17/2018 10:30   Dg Chest Port 1 View  Result Date:  04/18/2018 CLINICAL DATA:  Hemoptysis, dialysis EXAM: PORTABLE CHEST 1 VIEW COMPARISON:  Portable exam 0838 hours compared to 04/12/2018 FINDINGS: LEFT subclavian AICD lead projects over RIGHT ventricle. RIGHT jugular dual-lumen central venous catheter with tip projecting over SVC. Borderline enlargement of cardiac silhouette with slight pulmonary vascular congestion. Mediastinal contours normal. Elevated RIGHT diaphragm with RIGHT basilar atelectasis. Lungs clear. No infiltrate, pleural effusion or pneumothorax. IMPRESSION: RIGHT basilar atelectasis. Electronically Signed   By: Ulyses Southward M.D.   On: 04/18/2018 09:50    Medications: Infusions: . sodium chloride    . sodium chloride    . sodium chloride      Scheduled Medications: . allopurinol  100 mg Oral BID  . amiodarone  200 mg Oral BID  . apixaban  5 mg Oral BID  . calcitRIOL  0.5 mcg Oral Q T,Th,Sa-HD  . calcium acetate  1,334 mg Oral TID WC  . Chlorhexidine Gluconate Cloth  6 each Topical Daily  . Chlorhexidine Gluconate Cloth  6 each Topical Q0600  . feeding supplement (NEPRO CARB STEADY)  237 mL Oral TID BM  . feeding supplement (PRO-STAT SUGAR FREE 64)  30 mL Oral BID  . mexiletine  150 mg Oral Q12H  . midodrine  15 mg Oral TID WC  . multivitamin  1 tablet Oral QHS  . ranolazine  500 mg Oral BID  . sodium chloride flush  10-40 mL Intracatheter Q12H  . sodium chloride flush  3 mL Intravenous Q12H  . sodium chloride flush  3 mL Intravenous Q12H    have reviewed scheduled and prn medications.  Physical Exam: General: Sitting in bed, comfortable, not in distress Heart: Regular rate rhythm, S1-S2 normal Lungs: Clear bilateral, no crackles Abdomen: Soft, nontender, nondistended Extremities: No edema. Dialysis Access: Right upper extremity aVF faint bruit with no radiation.   Eric Murillo 04/19/2018,9:40 AM  LOS: 15 days

## 2018-04-19 NOTE — Progress Notes (Signed)
Pt c/o dizziness when he stood up this afternoon.  Hinton Dyer, RN

## 2018-04-19 NOTE — Progress Notes (Signed)
   Discussed with Beckey Downing PA/Dr Hoss.   Hold eliquis for 48 hours prior to fistulagram.   Discussed with Dr Shirlee Latch.   Amy Clegg NP-C  2:12 PM

## 2018-04-19 NOTE — Progress Notes (Signed)
Inpatient Rehabilitation-Admissions Coordinator   Noted plans for procedure on 9/30. AC will continue to follow for medical readiness and continue to assess need for post acute rehab.   Nanine Means, OTR/L  Rehab Admissions Coordinator  [phone] 04/19/2018 2:09 PM

## 2018-04-19 NOTE — Progress Notes (Signed)
Patient ID: Eric Murillo, male   DOB: [birth year], 57 y.o.   MRN: [id number]  Pt was scheduled for RUA dialysis fistula evaluation and possible intervention; Possible thrombolysis or tunneled dialysis catheter placement  INR 2.07 today  Will reschedule to [facility] 9/30 per Dr Bonnielee Haff  Will stop Eliquis for now- til after procedure. (to discuss with Heart Failure team)  Plan for procedure in IR 9/30-- orders in place  Will make RN aware

## 2018-04-19 NOTE — Progress Notes (Signed)
No further episode of bleeding.  Received ok order to give eliquis.  Hinton Dyer, RN

## 2018-04-19 NOTE — Progress Notes (Signed)
Occupational Therapy Treatment Patient Details Name: Eric Murillo MRN: [id number] DOB: 03/02/1961 Today's Date: 04/19/2018    History of present illness Pt is a 57 y.o. yo male with syncopal episode, recurrent VT requiring shock, heart cath 9/16. PMhx: ESRD, NICM, systolic heart failure, ICD   OT comments  This 57 yo male admitted with above presents to acute OT making progress with basic ADLs but more limited mobility over last PT session. Pt having a difficult time giving himself credit for what he is able to do and focusing on the next step. Pt's HR did shoot up to 140 (v-tach) after the first standing when he reported he was dizzy--BP stable. Will continue to follow and feel CIR is still the best for patient.   Follow Up Recommendations  CIR    Equipment Recommendations  3 in 1 bedside commode;Other (comment)(wide)       Precautions / Restrictions Precautions Precautions: Fall Restrictions Weight Bearing Restrictions: No       Mobility Bed Mobility Overal bed mobility: Needs Assistance Bed Mobility: Sit to Supine       Sit to supine: Mod assist   General bed mobility comments: assist to get both feet on bed  Transfers Overall transfer level: Needs assistance Equipment used: Rolling walker (2 wheeled) Transfers: Sit to/from Stand Sit to Stand: Min assist;+2 safety/equipment         General transfer comment: verbal cues to push up from bed with one hand and can hold onto walker with other. Pt also tended to sit without reaching back; stood up to walker x 3 trials    Balance Overall balance assessment: Needs assistance Sitting-balance support: No upper extremity supported;Feet supported Sitting balance-Leahy Scale: Fair Sitting balance - Comments: weakness evident   Standing balance support: Bilateral upper extremity supported Standing balance-Leahy Scale: Poor Standing balance comment: standing static with min A with walker                           ADL either performed or assessed with clinical judgement   ADL Overall ADL's : Needs assistance/impaired                       Lower Body Dressing Details (indicate cue type and reason): Pt able to sit EOB and doff/don socks with increased time (reported dizziness with coming back up from bending forward to work on socks. With sit<>stand pt needs Min A +2 for safety and lines                     Vision Patient Visual Report: No change from baseline            Cognition Arousal/Alertness: Awake/alert Behavior During Therapy: Flat affect Overall Cognitive Status: Within Functional Limits for tasks assessed                                 General Comments: pt focusing on what he can't do what he can do--encouraged pt to think about what he is doing and we can work with him towards doing more                   Pertinent Vitals/ Pain       Pain Assessment: No/denies pain Faces Pain Scale: Hurts little more Pain Location: neck Pain Descriptors / Indicators: Aching;Tightness Pain Intervention(s): Monitored during session;Repositioned  Frequency  Min 3X/week        Progress Toward Goals  OT Goals(current goals can now be found in the care plan section)  Progress towards OT goals: Progressing toward goals     Plan Discharge plan remains appropriate    Co-evaluation    PT/OT/SLP Co-Evaluation/Treatment: Yes Reason for Co-Treatment: For patient/therapist safety;To address functional/ADL transfers PT goals addressed during session: Mobility/safety with mobility;Strengthening/ROM OT goals addressed during session: ADL's and self-care      AM-PAC PT "6 Clicks" Daily Activity     Outcome Measure   Help from another person eating meals?: None Help from another person taking care of personal grooming?: A Little Help from another person toileting, which includes using toliet, bedpan, or urinal?: A Lot Help from another  person bathing (including washing, rinsing, drying)?: A Lot Help from another person to put on and taking off regular upper body clothing?: A Little Help from another person to put on and taking off regular lower body clothing?: A Lot 6 Click Score: 16    End of Session Equipment Utilized During Treatment: Gait belt;Rolling walker  OT Visit Diagnosis: Unsteadiness on feet (R26.81);Other abnormalities of gait and mobility (R26.89);Muscle weakness (generalized) (M62.81)   Activity Tolerance Patient limited by fatigue(and reported dizziness)   Patient Left in bed;with call bell/phone within reach;with bed alarm set;with family/visitor present   Nurse Communication          Time: 4098-1191 OT Time Calculation (min): 27 min  Charges: OT General Charges $OT Visit: 1 Visit OT Treatments $Self Care/Home Management : 8-22 mins  Ignacia Palma, OTR/L Acute [employer] Pager [phone] Office [phone]

## 2018-04-19 NOTE — Progress Notes (Addendum)
Patient ID: Eric Murillo, male   DOB: [birth year], 57 y.o.   MRN: [id number]     Advanced Heart Failure Rounding Note  PCP-Cardiologist: [name], MD   Subjective:    Presented to [facility] ED after a syncopal episode. 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 Transferred to [facility] and had another episode of VT in ED that required ATP.   9/18 VT arrest. Amio drip restarted but now back to po. No further arrythmias.   Over night had an isolated episode of nasusea/vomiting with a small streak of blood.   Complaining of fatigue. Denies SOB.    RHC/LHC 03/25/2018  1. Low, but not markedly low, cardiac output.  2. Pulmonary venous hypertension.  3. Markedly elevated right and left heart filling pressures.   4. No significant coronary disease.   RHC Procedural Findings: Hemodynamics (mmHg) RA mean 18 RV 65/17 PA 69/30, mean 47 PCWP mean 36 LV 100/27 AO 97/65 Oxygen saturations: PA 52% AO 94% Cardiac Output (Fick) 4.98  Cardiac Index (Fick) 2.12 PVR 2.2 WU Cardiac Output (Thermo) 4.85 Cardiac Index (Thermo) 2.06  PVR 2.3 WU   Objective:   Weight Range: 108.8 kg Body mass index is 35.43 kg/m.   Vital Signs:   Temp:  [97.4 F (36.3 C)-98.4 F (36.9 C)] 98 F (36.7 C) (09/27 0531) Pulse Rate:  [66-78] 71 (09/27 0531) Resp:  [14-17] 17 (09/26 1509) BP: (95-130)/(58-88) 130/88 (09/27 0531) SpO2:  [95 %-100 %] 100 % (09/27 0531) Weight:  [107 kg-108.8 kg] 108.8 kg (09/27 0531) Last BM Date: 04/15/18  Weight change: Filed Weights   04/18/18 0720 04/18/18 1200 04/19/18 0531  Weight: 111.7 kg 107 kg 108.8 kg    Intake/Output:   Intake/Output Summary (Last 24 hours) at 04/19/2018 1029 Last data filed at 04/19/2018 1025 Gross per 24 hour  Intake 3 ml  Output 4000 ml  Net -3997 ml      Physical Exam   General:  No resp difficulty. Sitting on the side of the bed.  HEENT: normal Neck: supple. no JVD. Carotids 2+ bilat; no bruits. No  lymphadenopathy or thryomegaly appreciated. LIJ HD catheter.  Cor: PMI nondisplaced. Regular rate & rhythm. No rubs, gallops or murmurs. Lungs: clear on room air.  Abdomen: soft, nontender, nondistended. No hepatosplenomegaly. No bruits or masses. Good bowel sounds. Extremities: no cyanosis, clubbing, rash, edema. RUE AVF  Neuro: alert & orientedx3, cranial nerves grossly intact. moves all 4 extremities w/o difficulty. Affect pleasant   Telemetry   NSR 70s  No VT. Personally checked.   Labs    CBC Recent Labs    04/18/18 0350 04/19/18 0500  WBC 17.4* 16.1*  NEUTROABS 12.6* 11.7*  HGB 13.2 13.8  HCT 43.2 44.9  MCV 96.2 96.4  PLT 286 253   Basic Metabolic Panel Recent Labs    40/98/11 0350 04/19/18 0500  NA 128* 132*  K 5.1 4.5  CL 90* 95*  CO2 23 21*  GLUCOSE 85 86  BUN 62* 48*  CREATININE 7.73* 6.64*  CALCIUM 9.0 8.4*  PHOS 6.9* 5.7*   Liver Function Tests Recent Labs    04/18/18 0350 04/19/18 0500  ALBUMIN 3.1* 3.0*   No results for input(s): LIPASE, AMYLASE in the last 72 hours. Cardiac Enzymes No results for input(s): CKTOTAL, CKMB, CKMBINDEX, TROPONINI in the last 72 hours.  BNP: BNP (last 3 results) Recent Labs    07/26/17 0916 11/03/17 1238 11/13/17 0400  BNP 399.7* 1,160.1* 1,085.3*    ProBNP (  last 3 results) No results for input(s): PROBNP in the last 8760 hours.   D-Dimer No results for input(s): DDIMER in the last 72 hours. Hemoglobin A1C No results for input(s): HGBA1C in the last 72 hours. Fasting Lipid Panel No results for input(s): CHOL, HDL, LDLCALC, TRIG, CHOLHDL, LDLDIRECT in the last 72 hours. Thyroid Function Tests No results for input(s): TSH, T4TOTAL, T3FREE, THYROIDAB in the last 72 hours.  Invalid input(s): FREET3  Other results:   Imaging    No results found.   Medications:     Scheduled Medications: . allopurinol  100 mg Oral BID  . amiodarone  200 mg Oral BID  . apixaban  5 mg Oral BID  . calcitRIOL   0.5 mcg Oral Q T,Th,Sa-HD  . calcium acetate  1,334 mg Oral TID WC  . Chlorhexidine Gluconate Cloth  6 each Topical Q0600  . feeding supplement (NEPRO CARB STEADY)  237 mL Oral TID BM  . feeding supplement (PRO-STAT SUGAR FREE 64)  30 mL Oral BID  . mexiletine  150 mg Oral Q12H  . midodrine  15 mg Oral TID WC  . multivitamin  1 tablet Oral QHS  . ranolazine  500 mg Oral BID  . sodium chloride flush  10-40 mL Intracatheter Q12H  . sodium chloride flush  3 mL Intravenous Q12H  . sodium chloride flush  3 mL Intravenous Q12H    Infusions: . sodium chloride    . sodium chloride    . sodium chloride      PRN Medications: sodium chloride, sodium chloride, sodium chloride, acetaminophen, alteplase, bismuth subsalicylate, HYDROcodone-acetaminophen, lidocaine (PF), lidocaine-prilocaine, ondansetron (ZOFRAN) IV, pentafluoroprop-tetrafluoroeth, promethazine, senna-docusate, sodium chloride flush, sodium chloride flush, sorbitol    Patient Profile   Eric Murillo is a 57 y.o. male with history of morbid obesity, hyperlipidemia, HTN, atrial flutter (s/p TEE DC-CV 04/03/14 and again in 8/17), NICM with chronic systolic HF and severe OSA but unable to tolerate CPAP.    Assessment/Plan   1. Recurrent VT requiring shock and ATP: Follows with Dr Graciela Husbands. EP has been consulted. Last saw Dr Graciela Husbands 04/03/18 and was having recurrent episodes of VF. Mexilitine was added in addition to his amio.  Coronary angiography this admission showed no CAD. Cardiac output was low but not markedly so.  Concern for VT as sign of end stage cardiomyopathy.   - No further VT.   - On po amiodarone, mexilitene and Ranexa, Dr Graciela Husbands following.  2. Chronic systolic heart failure: Nonischemic cardiomyopathy, likely related to HTN.  Echo in 8/14 with EF 45% but EF down to 25-30% on TEE while in atrial flutter (03/2014).  Echo (1/16) with EF ~25% and echo in 6/16 with EF 30-35%.  No cardiac MRI done with elevated creatinine and size.  There was concern for cardiac amyloidosis.  However, negative SPEP and abdominal fat pad biopsy negative. Low voltage on ECG may be due to obesity and not amyloidosis. Echo 10/2017: EF 20-25%, mild to mod MR. S/P [employer] ICD.  Marked volume overload by RHC this admission.  Unable get fluid off with HD initially due to hypotension and required CVVH.  However, now tolerating iHD on midodrine.  - iHD today for volume management.  - Continue midodrine 15 mg tid.  - He has end stage HF, not candidate for LVAD and unlikely to be able to get to heart/kidney transplant.  3. Atypical Atrial Flutter: Recurrent.  S/p several DCCVs, most recently 11/21/17. Has been seen by EP. Not felt  to be amenable to ablation.  -Maintaining NSR.  - Has small amount of blood when he vomited yesterday. Hgb stable. Resolved - Restart Eliqus 5 mg BID 4. ESRD: Now on HD T/R/Sat.  Intolerant iHD earlier this admission, hypotensive when trying to pull off excess fluid and had VT. He initially required norepinephrine and CVVH, now back to iHD and tolerating.  To OR today for fistulagram.  5. OSA: Does not tolerate CPAP.  6. Morbid Obesity:   Body mass index is 35.43 kg/m.  7. ID: Afebrile, WBCs have been elevated but lower today.  Reports episode hemoptysis last night.  - CXR with right basilar atelectasis.    8. Deconditioning - PT following.   - I think he is ready for CIR.  9. Pill dysphagia: Barium swallow 9/25 was normal.    Possible d/c to CIR today. TO OR today for fistulagram.   Length of Stay: 15  Amy Clegg, NP  04/19/2018, 10:29 AM  Advanced Heart Failure Team Pager [phone] (M-F; 7a - 4p)  Please contact [facility] for night-coverage after hours (4p -7a ) and weekends on [email or web address]  Patient seen with NP, agree with the above note.    Stable overall.  Slight hematemesis with vomiting.  Stable hgb.  Can continue Eliquis.    Should be ready for CIR when bed available.   [name] 04/19/2018

## 2018-04-20 ENCOUNTER — Inpatient Hospital Stay (HOSPITAL_COMMUNITY): Payer: Medicaid Other

## 2018-04-20 ENCOUNTER — Inpatient Hospital Stay (HOSPITAL_COMMUNITY): Payer: Medicaid Other | Admitting: Certified Registered Nurse Anesthetist

## 2018-04-20 ENCOUNTER — Other Ambulatory Visit: Payer: Self-pay | Admitting: Oncology

## 2018-04-20 DIAGNOSIS — Z978 Presence of other specified devices: Secondary | ICD-10-CM

## 2018-04-20 DIAGNOSIS — G9340 Encephalopathy, unspecified: Secondary | ICD-10-CM

## 2018-04-20 DIAGNOSIS — E872 Acidosis, unspecified: Secondary | ICD-10-CM

## 2018-04-20 LAB — POCT I-STAT 3, ART BLOOD GAS (G3+)
ACID-BASE DEFICIT: 3 mmol/L — AB (ref 0.0–2.0)
ACID-BASE EXCESS: 7 mmol/L — AB (ref 0.0–2.0)
Acid-base deficit: 17 mmol/L — ABNORMAL HIGH (ref 0.0–2.0)
BICARBONATE: 26.1 mmol/L (ref 20.0–28.0)
Bicarbonate: 15.6 mmol/L — ABNORMAL LOW (ref 20.0–28.0)
Bicarbonate: 20.4 mmol/L (ref 20.0–28.0)
O2 SAT: 96 %
O2 SAT: 100 %
O2 SAT: 100 %
PCO2 ART: 32.9 mmHg (ref 32.0–48.0)
PO2 ART: 178 mmHg — AB (ref 83.0–108.0)
Patient temperature: 97.7
TCO2: 18 mmol/L — ABNORMAL LOW (ref 22–32)
TCO2: 21 mmol/L — AB (ref 22–32)
TCO2: 27 mmol/L (ref 22–32)
pCO2 arterial: 65.9 mmHg (ref 32.0–48.0)
pCO2 arterial: 22.4 mmHg — ABNORMAL LOW (ref 32.0–48.0)
pH, Arterial: 6.983 — CL (ref 7.350–7.450)
pH, Arterial: 7.402 (ref 7.350–7.450)
pH, Arterial: 7.673 (ref 7.350–7.450)
pO2, Arterial: 131 mmHg — ABNORMAL HIGH (ref 83.0–108.0)
pO2, Arterial: 468 mmHg — ABNORMAL HIGH (ref 83.0–108.0)

## 2018-04-20 LAB — RENAL FUNCTION PANEL
ANION GAP: 20 — AB (ref 5–15)
Albumin: 3.3 g/dL — ABNORMAL LOW (ref 3.5–5.0)
Albumin: 2.9 g/dL — ABNORMAL LOW (ref 3.5–5.0)
Anion gap: 22 — ABNORMAL HIGH (ref 5–15)
BUN: 77 mg/dL — ABNORMAL HIGH (ref 6–20)
BUN: 63 mg/dL — ABNORMAL HIGH (ref 6–20)
CALCIUM: 9.7 mg/dL (ref 8.9–10.3)
CHLORIDE: 84 mmol/L — AB (ref 98–111)
CO2: 20 mmol/L — ABNORMAL LOW (ref 22–32)
CO2: 21 mmol/L — AB (ref 22–32)
CREATININE: 9.95 mg/dL — AB (ref 0.61–1.24)
Calcium: 9.1 mg/dL (ref 8.9–10.3)
Chloride: 90 mmol/L — ABNORMAL LOW (ref 98–111)
Creatinine, Ser: 7.28 mg/dL — ABNORMAL HIGH (ref 0.61–1.24)
GFR calc Af Amer: 9 mL/min — ABNORMAL LOW (ref >60)
GFR calc non Af Amer: 7 mL/min — ABNORMAL LOW (ref >60)
GFR, EST AFRICAN AMERICAN: 6 mL/min — AB (ref >60)
GFR, EST NON AFRICAN AMERICAN: 5 mL/min — AB (ref >60)
GLUCOSE: 106 mg/dL — AB (ref 70–99)
Glucose, Bld: 107 mg/dL — ABNORMAL HIGH (ref 70–99)
POTASSIUM: 6.2 mmol/L — AB (ref 3.5–5.1)
POTASSIUM: 4 mmol/L (ref 3.5–5.1)
Phosphorus: 10.1 mg/dL — ABNORMAL HIGH (ref 2.5–4.6)
Phosphorus: 3.7 mg/dL (ref 2.5–4.6)
SODIUM: 131 mmol/L — AB (ref 135–145)
Sodium: 126 mmol/L — ABNORMAL LOW (ref 135–145)

## 2018-04-20 LAB — POCT I-STAT, CHEM 8
BUN: 81 mg/dL — AB (ref 6–20)
CALCIUM ION: 1.57 mmol/L — AB (ref 1.15–1.40)
CHLORIDE: 94 mmol/L — AB (ref 98–111)
CREATININE: 10.2 mg/dL — AB (ref 0.61–1.24)
Glucose, Bld: 151 mg/dL — ABNORMAL HIGH (ref 70–99)
HCT: 46 % (ref 39.0–52.0)
Hemoglobin: 15.6 g/dL (ref 13.0–17.0)
Potassium: 5.5 mmol/L — ABNORMAL HIGH (ref 3.5–5.1)
SODIUM: 130 mmol/L — AB (ref 135–145)
TCO2: 18 mmol/L — ABNORMAL LOW (ref 22–32)

## 2018-04-20 LAB — CBC WITH DIFFERENTIAL/PLATELET
Abs Immature Granulocytes: 0.3 10*3/uL — ABNORMAL HIGH (ref 0.0–0.1)
BAND NEUTROPHILS: 2 %
BASOS ABS: 0 10*3/uL (ref 0.0–0.1)
BASOS PCT: 0 %
Basophils Absolute: 0 10*3/uL (ref 0.0–0.1)
Basophils Relative: 0 %
EOS ABS: 0 10*3/uL (ref 0.0–0.7)
EOS ABS: 0 10*3/uL (ref 0.0–0.7)
EOS PCT: 0 %
Eosinophils Relative: 0 %
HCT: 46.9 % (ref 39.0–52.0)
HCT: 44.1 % (ref 39.0–52.0)
HEMOGLOBIN: 13.8 g/dL (ref 13.0–17.0)
Hemoglobin: 14.5 g/dL (ref 13.0–17.0)
IMMATURE GRANULOCYTES: 2 %
Lymphocytes Relative: 10 %
Lymphocytes Relative: 4 %
Lymphs Abs: 1.7 10*3/uL (ref 0.7–4.0)
Lymphs Abs: 0.9 10*3/uL (ref 0.7–4.0)
MCH: 29.5 pg (ref 26.0–34.0)
MCH: 29.9 pg (ref 26.0–34.0)
MCHC: 30.9 g/dL (ref 30.0–36.0)
MCHC: 31.3 g/dL (ref 30.0–36.0)
MCV: 95.3 fL (ref 78.0–100.0)
MCV: 95.7 fL (ref 78.0–100.0)
METAMYELOCYTES PCT: 1 %
MONOS PCT: 5 %
MYELOCYTES: 1 %
Monocytes Absolute: 2.2 10*3/uL — ABNORMAL HIGH (ref 0.1–1.0)
Monocytes Absolute: 1.1 10*3/uL — ABNORMAL HIGH (ref 0.1–1.0)
Monocytes Relative: 12 %
NEUTROS ABS: 13.3 10*3/uL — AB (ref 1.7–7.7)
NEUTROS PCT: 76 %
Neutro Abs: 20.4 10*3/uL — ABNORMAL HIGH (ref 1.7–7.7)
Neutrophils Relative %: 87 %
PLATELETS: 287 10*3/uL (ref 150–400)
Platelets: 260 10*3/uL (ref 150–400)
RBC: 4.92 MIL/uL (ref 4.22–5.81)
RBC: 4.61 MIL/uL (ref 4.22–5.81)
RDW: 17 % — AB (ref 11.5–15.5)
RDW: 16.9 % — ABNORMAL HIGH (ref 11.5–15.5)
WBC: 17.7 10*3/uL — AB (ref 4.0–10.5)
WBC: 22.4 10*3/uL — ABNORMAL HIGH (ref 4.0–10.5)
nRBC: 2 /100 WBC — ABNORMAL HIGH

## 2018-04-20 LAB — COMPREHENSIVE METABOLIC PANEL
ALT: 235 U/L — AB (ref 0–44)
AST: 165 U/L — AB (ref 15–41)
Albumin: 2.7 g/dL — ABNORMAL LOW (ref 3.5–5.0)
Alkaline Phosphatase: 206 U/L — ABNORMAL HIGH (ref 38–126)
Anion gap: 29 — ABNORMAL HIGH (ref 5–15)
BUN: 82 mg/dL — AB (ref 6–20)
CHLORIDE: 86 mmol/L — AB (ref 98–111)
CO2: 15 mmol/L — AB (ref 22–32)
CREATININE: 10.22 mg/dL — AB (ref 0.61–1.24)
Calcium: 10.4 mg/dL — ABNORMAL HIGH (ref 8.9–10.3)
GFR calc Af Amer: 6 mL/min — ABNORMAL LOW (ref >60)
GFR calc non Af Amer: 5 mL/min — ABNORMAL LOW (ref >60)
GLUCOSE: 111 mg/dL — AB (ref 70–99)
Potassium: 5.2 mmol/L — ABNORMAL HIGH (ref 3.5–5.1)
SODIUM: 130 mmol/L — AB (ref 135–145)
Total Bilirubin: 2.2 mg/dL — ABNORMAL HIGH (ref 0.3–1.2)
Total Protein: 7 g/dL (ref 6.5–8.1)

## 2018-04-20 LAB — LACTIC ACID, PLASMA
LACTIC ACID, VENOUS: 17.4 mmol/L — AB (ref 0.5–1.9)
Lactic Acid, Venous: 9.2 mmol/L (ref 0.5–1.9)

## 2018-04-20 LAB — APTT
APTT: 33 s (ref 24–36)
aPTT: 35 seconds (ref 24–36)

## 2018-04-20 LAB — GLUCOSE, CAPILLARY: Glucose-Capillary: 121 mg/dL — ABNORMAL HIGH (ref 70–99)

## 2018-04-20 MED ORDER — SODIUM BICARBONATE 8.4 % IV SOLN
INTRAVENOUS | Status: DC
  Administered 2018-04-20 – 2018-04-21 (×3): via INTRAVENOUS
  Filled 2018-04-20 (×5): qty 150

## 2018-04-20 MED ORDER — SODIUM BICARBONATE 8.4 % IV SOLN
INTRAVENOUS | Status: AC
  Filled 2018-04-20: qty 100

## 2018-04-20 MED ORDER — LORAZEPAM 2 MG/ML IJ SOLN
INTRAMUSCULAR | Status: AC
  Filled 2018-04-20: qty 1

## 2018-04-20 MED ORDER — DEXTROSE 50 % IV SOLN
50.0000 mL | Freq: Once | INTRAVENOUS | Status: AC
  Administered 2018-04-20: 50 mL via INTRAVENOUS
  Filled 2018-04-20: qty 50

## 2018-04-20 MED ORDER — DOCUSATE SODIUM 50 MG/5ML PO LIQD: 100.0000 mg | Freq: Two times a day (BID) | ORAL | Status: DC | PRN

## 2018-04-20 MED ORDER — CHLORHEXIDINE GLUCONATE 0.12% ORAL RINSE (MEDLINE KIT)
15.0000 mL | Freq: Two times a day (BID) | OROMUCOSAL | Status: DC
  Administered 2018-04-20 – 2018-04-21 (×2): 15 mL via OROMUCOSAL

## 2018-04-20 MED ORDER — MIDAZOLAM HCL 2 MG/2ML IJ SOLN: 1.0000 mg | INTRAMUSCULAR | Status: DC | PRN

## 2018-04-20 MED ORDER — NOREPINEPHRINE 16 MG/250ML-% IV SOLN
0.0000 ug/min | INTRAVENOUS | Status: DC
  Administered 2018-04-20: 20 ug/min via INTRAVENOUS
  Administered 2018-04-21: 25 ug/min via INTRAVENOUS
  Administered 2018-04-21: 30 ug/min via INTRAVENOUS
  Administered 2018-04-21: 9 ug/min via INTRAVENOUS
  Administered 2018-04-22: 33 ug/min via INTRAVENOUS
  Administered 2018-04-22: 35 ug/min via INTRAVENOUS
  Administered 2018-04-23: 40 ug/min via INTRAVENOUS
  Administered 2018-04-23: 25 ug/min via INTRAVENOUS
  Filled 2018-04-20 (×7): qty 250

## 2018-04-20 MED ORDER — SODIUM CHLORIDE 0.9 % IV SOLN
2.0000 g | Freq: Once | INTRAVENOUS | Status: AC
  Administered 2018-04-20: 2 g via INTRAVENOUS
  Filled 2018-04-20: qty 2

## 2018-04-20 MED ORDER — VANCOMYCIN HCL 10 G IV SOLR
2000.0000 mg | Freq: Once | INTRAVENOUS | Status: AC
  Administered 2018-04-20: 2000 mg via INTRAVENOUS
  Filled 2018-04-20: qty 2000

## 2018-04-20 MED ORDER — HEPARIN SODIUM (PORCINE) 1000 UNIT/ML DIALYSIS
1000.0000 [IU] | INTRAMUSCULAR | Status: DC | PRN
  Filled 2018-04-20: qty 6

## 2018-04-20 MED ORDER — HEPARIN (PORCINE) IN NACL 100-0.45 UNIT/ML-% IJ SOLN
950.0000 [IU]/h | INTRAMUSCULAR | Status: DC
  Administered 2018-04-20: 1100 [IU]/h via INTRAVENOUS
  Filled 2018-04-20 (×2): qty 250

## 2018-04-20 MED ORDER — NOREPINEPHRINE 4 MG/250ML-% IV SOLN
0.0000 ug/min | INTRAVENOUS | Status: DC
  Filled 2018-04-20: qty 250

## 2018-04-20 MED ORDER — FENTANYL 2500MCG IN NS 250ML (10MCG/ML) PREMIX INFUSION
25.0000 ug/h | INTRAVENOUS | Status: DC
  Administered 2018-04-20 (×2): 200 ug/h via INTRAVENOUS
  Administered 2018-04-21: 100 ug/h via INTRAVENOUS
  Administered 2018-04-21: 200 ug/h via INTRAVENOUS
  Administered 2018-04-23: 50 ug/h via INTRAVENOUS
  Filled 2018-04-20 (×5): qty 250

## 2018-04-20 MED ORDER — FENTANYL CITRATE (PF) 100 MCG/2ML IJ SOLN: 50.0000 ug | Freq: Once | INTRAMUSCULAR | Status: DC

## 2018-04-20 MED ORDER — MIDAZOLAM HCL 2 MG/2ML IJ SOLN
1.0000 mg | INTRAMUSCULAR | Status: DC | PRN
  Administered 2018-04-20 – 2018-04-22 (×3): 1 mg via INTRAVENOUS
  Filled 2018-04-20 (×3): qty 2

## 2018-04-20 MED ORDER — INSULIN ASPART 100 UNIT/ML IV SOLN
5.0000 [IU] | Freq: Once | INTRAVENOUS | Status: AC
  Administered 2018-04-20: 5 [IU] via INTRAVENOUS

## 2018-04-20 MED ORDER — EPINEPHRINE PF 1 MG/ML IJ SOLN
0.5000 ug/min | INTRAVENOUS | Status: DC
  Administered 2018-04-20: 3 ug/min via INTRAVENOUS
  Filled 2018-04-20 (×2): qty 4

## 2018-04-20 MED ORDER — PRISMASOL BGK 0/2.5 32-2.5 MEQ/L IV SOLN
INTRAVENOUS | Status: DC
  Administered 2018-04-20 – 2018-04-21 (×7): via INTRAVENOUS_CENTRAL
  Filled 2018-04-20 (×15): qty 5000

## 2018-04-20 MED ORDER — PRISMASOL BGK 0/2.5 32-2.5 MEQ/L IV SOLN
INTRAVENOUS | Status: DC
  Administered 2018-04-20 (×2): via INTRAVENOUS_CENTRAL
  Filled 2018-04-20 (×4): qty 5000

## 2018-04-20 MED ORDER — FENTANYL BOLUS VIA INFUSION
50.0000 ug | INTRAVENOUS | Status: DC | PRN
  Filled 2018-04-20: qty 50

## 2018-04-20 MED ORDER — HEPARIN (PORCINE) 2000 UNITS/L FOR CRRT
INTRAVENOUS_CENTRAL | Status: DC | PRN
  Filled 2018-04-20: qty 1000

## 2018-04-20 MED ORDER — INSULIN ASPART 100 UNIT/ML IV SOLN: 10.0000 [IU] | Freq: Once | INTRAVENOUS | Status: DC

## 2018-04-20 MED ORDER — PRISMASOL BGK 0/2.5 32-2.5 MEQ/L IV SOLN
INTRAVENOUS | Status: DC
  Administered 2018-04-20 – 2018-04-21 (×2): via INTRAVENOUS_CENTRAL
  Filled 2018-04-20 (×3): qty 5000

## 2018-04-20 MED ORDER — ORAL CARE MOUTH RINSE
15.0000 mL | OROMUCOSAL | Status: DC
  Administered 2018-04-20 – 2018-04-21 (×6): 15 mL via OROMUCOSAL

## 2018-04-20 NOTE — Anesthesia Procedure Notes (Signed)
Procedure Name: Intubation Date/Time: 04/20/2018 9:38 AM Performed by: Dairl Ponder, CRNA Pre-anesthesia Checklist: Patient identified, Emergency Drugs available, Suction available, Patient being monitored and Timeout performed Patient Re-evaluated:Patient Re-evaluated prior to induction Oxygen Delivery Method: Circle system utilized Preoxygenation: Pre-oxygenation with 100% oxygen Laryngoscope Size: Glidescope and 4 Grade View: Grade I Tube type: Subglottic suction tube Tube size: 7.5 mm Number of attempts: 1 Airway Equipment and Method: Stylet Placement Confirmation: ETT inserted through vocal cords under direct vision,  positive ETCO2 and breath sounds checked- equal and bilateral Secured at: 22 cm Tube secured with: Tape Dental Injury: Teeth and Oropharynx as per pre-operative assessment

## 2018-04-20 NOTE — Progress Notes (Signed)
Patient arrived to 2H01. CPR currently being performed. Dr. Adella Hare at the bedside.

## 2018-04-20 NOTE — Significant Event (Addendum)
Rapid Response Event Note Call received per floor RN regarding Pt with widening QRS. VSS per floor RN a that time. Advised to page Attending (Cardiology) and complete EKG STAT while RRT en route.   Overview: Time Called: 0806 Arrival Time: 0811 Event Type: Cardiac  Initial Focused Assessment: Pt found resting in bed. Pt initially lethargic but oriented to self, time and location, impulsive and disoriented to situation.  Denies pain, denies SOB, denies chest pain at this time. Lungs clear diminished, heart tones WNL. EKG with wide complex QRS, minimal changes from previous EKG in epic. Central Tele consulted and verified QRS has widened during the night and strip appears different. VS 73/56 HR 61, RR 16, Po2 89-90 on RA. Placed on 3 LNC po2 sats improved to 93-95%.   Interventions: NP Jeraldine Loots on call paged and updated on Pt status per [facility]. NP to have Dr. Katrinka Blazing review EKG, no further orders at that time. Nephrology Dr. Ronalee Belts at bedside on AM rounds, per MD Pt unstable for HD today. Code status discussed with Pt and family at bedside per MD. No changes to code status at this time. PT with increasing lethargy and consistent soft BP Jeraldine Loots NP paged and updated. Pt to transfer to ICU for closer monitoring. Orders placed per NP. Dr. Katrinka Blazing at bedside [phone] agrees with transfer to ICU to start pressors. At 0926 Pt found with pulse but agonal breathing. Code blue called BVM and chest compressions started. See Code blue sheet for Code detatils  . Pt transferred to 2 H 01.     Event Summary: Name of Physician Notified: NP Jeraldine Loots at [phone]    at    Outcome: Transferred (Comment), Other (Comment)(Orders placed for Transfer to ICU)     Cliffton Asters, James Ivanoff Taraoluwa Thakur

## 2018-04-20 NOTE — Progress Notes (Signed)
   04/20/18 0900  Clinical Encounter Type  Visited With Family  Visit Type Code  Spiritual Encounters  Spiritual Needs Emotional  Responded to Code Blue on 6 east room 16. Supported family in hallway and Doctors /CPR team was able to revive patient. Transported Pt. To 2 Heart room 1. Left family there and informed them that we are here if they need [place] again. Provided spiritual support.

## 2018-04-20 NOTE — Progress Notes (Signed)
Pt is refusing dialysis and meds. Nephrology and dialysis nurse notified. Pt Continues to refuse after talking to dialysis nurse. Report given to Porfirio Oar, RN day shift staff.

## 2018-04-20 NOTE — Procedures (Signed)
Central Venous Catheter Insertion Procedure Note Eric Murillo [id number] 1961-01-05  Procedure: Insertion of Central Venous Catheter Indications: Assessment of intravascular volume  Procedure Details Consent: Unable to obtain consent because of emergent medical necessity. Time Out: Verified patient identification, verified procedure, site/side was marked, verified correct patient position, special equipment/implants available, medications/allergies/relevent history reviewed, required imaging and test results available.  Performed  Maximum sterile technique was used including antiseptics, cap, gloves, gown, hand hygiene, mask and sheet. Skin prep: Chlorhexidine; local anesthetic administered A antimicrobial bonded/coated triple lumen catheter was placed in the right femoral vein due to emergent situation using the Seldinger technique.  Evaluation Blood flow good Complications: No apparent complications Patient did tolerate procedure well.   Arvilla Meres MD 04/20/2018, 2:51 PM

## 2018-04-20 NOTE — Code Documentation (Signed)
  Patient Name: Eric Murillo   MRN: [id number]   Date of Birth/ Sex: [birth year] , male      Admission Date: 03/26/2018  Attending Provider: Laurey Morale, MD  Primary Diagnosis: V-tach [facility])   Indication: Pt was in his usual state of health until this AM, when he was noted to be PEA arrest. Code blue was subsequently called. At the time of arrival on scene, ACLS protocol was underway.   Technical Description:  - CPR performance duration:  18 minutes  - Was defibrillation or cardioversion used? No   - Was external pacer placed? Yes  - Was patient intubated pre/post CPR? Yes   Medications Administered: Y = Yes; Blank = No Amiodarone    Atropine    Calcium  Y  Epinephrine  Y  Lidocaine    Magnesium    Norepinephrine  Y  Phenylephrine    Sodium bicarbonate  Y  Vasopressin     Post CPR evaluation:  - Final Status - Was patient successfully resuscitated ? Yes - What is current rhythm? Wide complex brady cardia  - What is current hemodynamic status? Unstable, weak pulse, bradycardia   Miscellaneous Information:  - Labs sent, including: BMP, Cultures, Troponin, lactate  - Primary team notified?  Yes  - Family Notified? Yes  - Additional notes/ transfer status: Transferred to Cardiac ICU with Dr. Gala Romney accompanying.      Lanelle Bal, MD  04/20/2018, 9:57 AM

## 2018-04-20 NOTE — Consult Note (Signed)
Name: Eric Murillo MRN: [id number] DOB: August 05, 1960    ADMISSION DATE:  04/06/2018 CONSULTATION DATE: 12 20, 2019  REFERRING MD : Cardiology service CHIEF COMPLAINT: Post cardiac arrest due to PEA with bradycardia  BRIEF PATIENT DESCRIPTION: Patient is a chronic hemodialysis patient he was refusing dialysis he was hyperkalemic in the 6 range he had CPR arrest due to bradycardia most likely due to hyperkalemia in the floor still waiting for the code sheets.  Patient is being started on dialysis stat.  SIGNIFICANT EVENTS  Post cardiac arrest     HISTORY OF PRESENT ILLNESS:   Patient is a 57 year old African-American male with past medical history of end-stage renal disease hemodialysis noncompliant hypertension hyperlipidemia.  Patient is also known to have recurrent V. tach chronic systolic heart failure 20-25% nonischemic cardiomyopathy who was here since September 16 he was admitted this time with syncope. Patient had several attacks of V. tach in the past thought to be due to hyperkalemia noncompliant and CHF.  We are consulted for vent management patient is intubated bucking the vent he is responding minimally opening his eyes to order.  PAST MEDICAL HISTORY :   has a past medical history of AICD (automatic cardioverter/defibrillator) present, Anemia of chronic disease, Arthritis, Atrial flutter (HCC), Chest pain on exertion, Chronic systolic congestive heart failure, NYHA class 2 (HCC), CKD (chronic kidney disease) stage 3, GFR 30-59 ml/min (HCC), Dyslipidemia, Gout, Hyperglycemia, Hyperlipidemia, Hypertension, Morbid obesity with BMI of 45.0-49.9, adult (HCC), Non-ischemic cardiomyopathy (HCC), Organic erectile dysfunction, OSA (obstructive sleep apnea), Pilonidal cyst, and Submandibular sialolithiasis.  has a past surgical history that includes Colonoscopy; Salivary gland surgery (09/12/2012); Cardiac catheterization (2012); Submandibular gland excision (Right, 09/12/2012); TEE without  cardioversion (N/A, 04/03/2014); Cardioversion (N/A, 04/03/2014); left and right heart catheterization with coronary angiogram (N/A, 04/24/2014); Cardiac catheterization (N/A, 05/21/2015); Cardioversion (N/A, 03/09/2016); TEE without cardioversion (N/A, 03/09/2016); TEE without cardioversion (N/A, 06/06/2017); Cardioversion (N/A, 06/06/2017); IR Fluoro Guide CV Line Right (11/08/2017); IR US Guide Vasc Access Right (11/08/2017); Insertion of dialysis catheter (Right, 11/12/2017); AV fistula placement (Right, 11/12/2017); TEE without cardioversion (N/A, 11/21/2017); Cardioversion (N/A, 11/21/2017); and RIGHT/LEFT HEART CATH AND CORONARY ANGIOGRAPHY (N/A, 04-19-18). Prior to Admission medications   Medication Sig Start Date End Date Taking? Authorizing Provider  allopurinol (ZYLOPRIM) 100 MG tablet TAKE 1 TABLET BY MOUTH TWO TIMES DAILY Patient taking differently: Take 100 mg by mouth 2 (two) times daily.  02/07/18  Yes Inez Catalina, MD  amiodarone (PACERONE) 200 MG tablet Take 1 tablet (200 mg total) by mouth 2 (two) times daily. 04/03/18  Yes Duke Salvia, MD  amoxicillin (AMOXIL) 500 MG capsule Take 500 mg by mouth 3 (three) times daily. 7 DAY COURSE STARTING ON 03/29/2018   Yes [provider]  apixaban (ELIQUIS) 2.5 MG TABS tablet Take 1 tablet (2.5 mg total) by mouth 2 (two) times daily. 03/13/18  Yes Levert Feinstein, MD  bismuth subsalicylate (PEPTO BISMOL) 262 MG chewable tablet Chew 524 mg by mouth daily as needed for indigestion.   Yes [provider]  calcitRIOL (ROCALTROL) 0.5 MCG capsule Take 1 capsule (0.5 mcg total) by mouth every other day. Patient taking differently: Take 0.5 mcg by mouth Every Tuesday,Thursday,and Saturday with dialysis.  11/24/17  Yes Alford Highland, NP  calcium acetate (PHOSLO) 667 MG capsule TAKE 2 CAPSULES (1,334 MG TOTAL) BY MOUTH 3 (THREE) TIMES DAILY WITH MEALS. 03/14/18  Yes Levert Feinstein, MD  carvedilol (COREG) 3.125 MG tablet Take 1 tablet  (3.125 mg  total) by mouth 2 (two) times daily with a meal. Give ONLY on non-HD days. Hold on dialysis days. 11/22/17  Yes Alford Highland, NP  diclofenac sodium (VOLTAREN) 1 % GEL Apply 4 g topically 4 (four) times daily. Patient taking differently: Apply 4 g topically 4 (four) times daily as needed (for pain at affected sites).  02/07/17  Yes Charlsie Quest, MD  HYDROcodone-acetaminophen (NORCO/VICODIN) 5-325 MG tablet Take 1-2 tablets by mouth every 4 (four) hours as needed. 03/02/18  Yes Ivery Quale, PA-C  mexiletine (MEXITIL) 200 MG capsule Take 1 capsule (200 mg total) by mouth 2 (two) times daily. 04/03/18  Yes Duke Salvia, MD  multivitamin (RENA-VIT) TABS tablet Take 1 tablet by mouth at bedtime. 11/22/17  Yes Alford Highland, NP  clindamycin (CLEOCIN) 300 MG capsule Take 1 capsule (300 mg total) by mouth 3 (three) times daily. Patient not taking: Reported on 2018/04/19 03/02/18   Ivery Quale, PA-C   Allergies  Allergen Reactions  . Nsaids Other (See Comments)    Cannot take to due kidney issues  . Beta Adrenergic Blockers Other (See Comments)    An unnamed, white-colored Beta Blocker made him "feel funny" = made him feel "cagey"    FAMILY HISTORY:  family history includes Cancer in his father; Colon cancer in his maternal aunt; Diabetes in his brother and sister; Hypertension in his father and mother. SOCIAL HISTORY:  reports that he has never smoked. He has never used smokeless tobacco. He reports that he does not drink alcohol or use drugs.  REVIEW OF SYSTEMS:   Unable to respond unable to obtain due to patient's conditions.  SUBJECTIVE:   VITAL SIGNS: Temp:  [97.4 F (36.3 C)-97.6 F (36.4 C)] 97.6 F (36.4 C) (09/27 1955) Pulse Rate:  [54-82] 54 (09/28 0900) Resp:  [16-23] 23 (09/28 0603) BP: (70-160)/(47-85) 70/47 (09/28 0904) SpO2:  [90 %-99 %] 94 % (09/28 0904) FiO2 (%):  [100 %] 100 % (09/28 1024) Weight:  [109.7 kg] 109.7 kg (09/28 0549)  PHYSICAL  EXAMINATION: General: Intubated sedated still dyssynchronous with the vent. Neuro: Opening eyes but the patient is very agitated HEENT:  atraumatic , no jaundice , dry mucous membranes  Cardiovascular:  Irregular irregular , ESM 2/6 in the aortic area  Lungs:  CTA bilateral , no wheezing or crackles  Abdomen:  Soft lax +BS , no tenderness . Musculoskeletal:  WNL , normal pulses  Skin:  No rash    Recent Labs  Lab 04/18/18 0350 04/19/18 0500 04/20/18 0725  NA 128* 132* 126*  K 5.1 4.5 6.2*  CL 90* 95* 84*  CO2 23 21* 20*  BUN 62* 48* 77*  CREATININE 7.73* 6.64* 9.95*  GLUCOSE 85 86 107*   Recent Labs  Lab 04/19/18 0500 04/20/18 0725 04/20/18 1115  HGB 13.8 14.5 13.8  HCT 44.9 46.9 44.1  WBC 16.1* 17.7* 22.4*  PLT 253 287 260   Dg Abd 1 View  Result Date: 04/20/2018 CLINICAL DATA:  Feeding tube placement EXAM: ABDOMEN - 1 VIEW COMPARISON:  None. FINDINGS: There is a nasogastric tube projecting over the stomach. There is oral contrast material within the transverse and descending colon. There is no bowel dilatation to suggest obstruction. There is no evidence of pneumoperitoneum, portal venous gas or pneumatosis. There are no pathologic calcifications along the expected course of the ureters. The osseous structures are unremarkable. IMPRESSION: Nasogastric tube with the tip projecting over the stomach. Electronically Signed   By: Alan Ripper  Patel   On: 04/20/2018 11:29   Dg Chest Port 1 View  Result Date: 04/20/2018 CLINICAL DATA:  Respiratory difficulty EXAM: PORTABLE CHEST 1 VIEW COMPARISON:  04/18/2018 FINDINGS: Endotracheal tube placed. Tip is 5.6 cm from the carina. NG tube placed. The tip is in the fundus of the stomach. Left subclavian AICD device is stable. Left jugular dialysis catheter unchanged. Mild cardiomegaly. Clear lungs. No pneumothorax. IMPRESSION: Endotracheal and NG tubes placed as described. Clear lungs. Electronically Signed   By: Jolaine Click M.D.   On:  04/20/2018 11:05    ASSESSMENT / PLAN:   --Status post cardiac arrest PEA arrest with bradycardia most likely due to hyperkalemia continue with vent support emergent dialysis patient has a line in place is going through dialysis now if he needs pressors we will start him on pressors to support him while he is being dialyzed. --  Acidosis and severe acidosis continue with dialysis maintain tissue perfusion and adjust ventilator according to the ABG pending. --Severe hyperkalemia initiate emergent dialysis. --Patient is chronic [place] kidney disease hemodialysis patient was refusing this being started. --This of heart failure avoid volume overload. --Patient has been admitted to the ICU for 3 times in the past several weeks consider talking to the patient about more complaints as he has been refusing dialysis. --I reviewed the chest x-ray myself ET tube is high thoracic inlet we will introduce the ET tube 40 cm down. --We will start the patient on a Versed and fentanyl drip. --Please call us with question thank you for this consultation. --Patient is minimally responsive he is not a candidate for hypothermia protocol we will continue to assess his mental status after 24 to 48 hours. --Patient critical care time spent on this patient is 38 minutes.  Pulmonary and Critical Care Medicine [facility] Pager: [phone]  04/20/2018, 11:54 AM

## 2018-04-20 NOTE — Progress Notes (Addendum)
CARDIOLOGY CRITICAL CARE NOTE   Called to see the patient under critical circumstances, where found in bed cold, blood pressure 60 mmHg, and inability to start IV pressor drip on the floor.  During history and in speaking with the family, the patient suffered brady-asystolic cardiac arrest and was noted to be pacing on his monitor without palpable pulse.  CPR was begun.  Adequate chest compressions were obtained.  Nephrology Ronalee Belts), advanced heart failure physician [facility]) team were consulted during the code in an attempt to determine level of aggressiveness.  The decision was made to go "full-court press".  CPR lasted 18 minutes.  He received 5 ampoules of epinephrine, 1 amp of bicarbonate, 1 amp of calcium chloride, 2 mg of IV Ativan for seizure, and was intubated.  A pulse was achieved.  Potassium was noted to be 6.5.  (Hence calcium given intravenously).  Post PCR the blood pressure is not measurable.  He does have a very faintly palpable femoral pulse.  Much time spent with the patient's sisters who advocate aggressive care including intubation, resuscitation, and dialysis.  IV levophed started and is currently at 20 mcg/min.  PLAN: Transfer to ICU, critical care consultation, ventilator support, full set of laboratory data including electrolytes, lactic acid, and blood cultures.  Spoke to advanced heart failure team, Dr. Gala Romney who will assume cardiology care.  Spoke to nephrology, Dr. Ronalee Belts who will assess the patient and determine if there is a role for dialysis.  Critical care time 40 minutes.

## 2018-04-20 NOTE — Progress Notes (Addendum)
   I responded to Code Blue. Due to VT arrest. Dr Katrinka Blazing running code. Patient intubated with ROSC on my arrival after 5 rounds of EPI and approximately 15 minutes CPR.   I personally transported him to CCU.  On arrival to CCU he had another asystolic arrest. Given epi and bicarb and CPR resumed. After several minutes again had ROSC.   Patient started on epi drip and given several rounds of bicarb and calcium.   Rhythm stabilized. Suspect sepsis. Cultures drawn. Vanc/Cefipime started.   Emergent femoral central venous line and arterial line placed.   Discussed with Dr. Ronalee Belts in Renal. Will attempt CVVHD as BP tolerates. Use inotropes to support.   Will not start cooling protocol given hemodynamic instability.   Discussed with family at length and suggested that we do not code him again if he arrests again. They want to think about it.   CRITICAL CARE Performed by: Arvilla Meres  Total critical care time: 60 minutes  Critical care time was exclusive of separately billable procedures and treating other patients.  Critical care was necessary to treat or prevent imminent or life-threatening deterioration.  Critical care was time spent personally by me (independent of midlevel providers or residents) on the following activities: development of treatment plan with patient and/or surrogate as well as nursing, discussions with consultants, evaluation of patient's response to treatment, examination of patient, obtaining history from patient or surrogate, ordering and performing treatments and interventions, ordering and review of laboratory studies, ordering and review of radiographic studies, pulse oximetry and re-evaluation of patient's condition.   Arvilla Meres, MD  2:50 PM

## 2018-04-20 NOTE — Procedures (Signed)
Right femoral artery catheter Insertion Procedure Note Brent Ptak [id number] November 07, 1960  Procedure: Insertion of arterial catheter Indications: Hemodynamic monitoring  Procedure Details Consent: Unable to obtain consent because of emergent medical necessity. Time Out: Verified patient identification, verified procedure, site/side was marked, verified correct patient position, special equipment/implants available, medications/allergies/relevent history reviewed, required imaging and test results available.  Performed  Maximum sterile technique was used including antiseptics, cap, gloves, gown, hand hygiene, mask and sheet. Skin prep: Chlorhexidine; local anesthetic administered A antimicrobial bonded/coated single lumen catheter was placed in the left femoral artery due to emergent situation using the Seldinger technique.  Evaluation Blood flow good Complications: No apparent complications Patient did tolerate procedure well.   Arvilla Meres MD 04/20/2018, 2:52 PM

## 2018-04-20 NOTE — Progress Notes (Signed)
[facility] NEPHROLOGY PROGRESS NOTE  Assessment/ Plan: Pt is a 57 y.o. yo male ESRD on HD TTS, with syncopal episode, recurrent VT requiring shock.  HD: [facility]/ TTS 4.5h  122.5 kg  2/2 bath Hep none  RUA AVF - calc 0.5 ug tiw - venofer 7/10 completed 100 mg qhd  #Recurrent VT, chronic systolic heart failure with EF of 25 to 30%, nonischemic cardiomyopathy: Filling pressures very high on right heart cath.  Currently on amiodarone, mexiletine. Cardiology following.   # ESRD: HD TTS.  He was on CRRT from 9/18-9/23. Last intermittent HD was on 9/26/019. IR planning for fistulogram on Monday if pt is stable and INR is low.  -Today, pt was hypotensive to 70s and lethargic. He reportedly refused dialysis this morning. He is very somnolent to answer question this time and very hypotensive. I have discussed the poor prognosis with pt's sister and niece at bedside. Discussed DNR status and GOC. The sister wanted to take some time before making decision.  At this condition, pt can't tolerate intermittent hemodialysis. I have discussed this with Dr. Katrinka Blazing from cardiologist.  If patient moved to ICU, may try CRRT with pressor, again long term prognosis is extremely poor.   He has temporary catheter.   # Hyperkalemia: schedule for dialysis today but hemodynamically unstable for intermittent HD. CRRT might help if goes to ICU. Will order dextrose and insulin temporarily.    # Anemia: Hemoglobin acceptable.   # Secondary hyperparathyroidism: Phosphorus is high, he is lethargic to take binders. On PhosLo.  # Hypotension/volume: On midodrine 15 mg 3 times daily. BP is low.   Subjective:  Seen and examined at bedside.  Patient is very lethargic and hypotensive.  He is not really following commands.  Apparently he refused dialysis in the morning.  Unable to obtain review of system. Objective Vital signs in last 24 hours: Vitals:   04/20/18 0549 04/20/18 0603 04/20/18 0900 04/20/18  0904  BP:  (!) 111/59 (!) 72/48 (!) 70/47  Pulse:  82 (!) 54   Resp:  (!) 23    Temp:      TempSrc:      SpO2:   94% 94%  Weight: 109.7 kg     Height:       Weight change: -2.02 kg  Intake/Output Summary (Last 24 hours) at 04/20/2018 0909 Last data filed at 04/19/2018 2200 Gross per 24 hour  Intake 153 ml  Output -  Net 153 ml       Labs: Basic Metabolic Panel: Recent Labs  Lab 04/18/18 0350 04/19/18 0500 04/20/18 0725  NA 128* 132* 126*  K 5.1 4.5 6.2*  CL 90* 95* 84*  CO2 23 21* 20*  GLUCOSE 85 86 107*  BUN 62* 48* 77*  CREATININE 7.73* 6.64* 9.95*  CALCIUM 9.0 8.4* 9.7  PHOS 6.9* 5.7* 10.1*   Liver Function Tests: Recent Labs  Lab 04/18/18 0350 04/19/18 0500 04/20/18 0725  ALBUMIN 3.1* 3.0* 3.3*   No results for input(s): LIPASE, AMYLASE in the last 168 hours. No results for input(s): AMMONIA in the last 168 hours. CBC: Recent Labs  Lab 04/16/18 0417 04/17/18 0322 04/18/18 0350 04/19/18 0500 04/20/18 0725  WBC 19.4* 19.5* 17.4* 16.1* 17.7*  NEUTROABS 14.4* 14.2* 12.6* 11.7* 13.3*  HGB 12.6* 12.8* 13.2 13.8 14.5  HCT 42.2 42.1 43.2 44.9 46.9  MCV 98.4 96.8 96.2 96.4 95.3  PLT 246 269 286 253 287   Cardiac Enzymes: No results for input(s): CKTOTAL,  CKMB, CKMBINDEX, TROPONINI in the last 168 hours. CBG: No results for input(s): GLUCAP in the last 168 hours.  Iron Studies: No results for input(s): IRON, TIBC, TRANSFERRIN, FERRITIN in the last 72 hours. Studies/Results: No results found.  Medications: Infusions: . sodium chloride    . sodium chloride    . sodium chloride      Scheduled Medications: . allopurinol  100 mg Oral BID  . amiodarone  200 mg Oral BID  . calcitRIOL  0.5 mcg Oral Q T,Th,Sa-HD  . calcium acetate  1,334 mg Oral TID WC  . Chlorhexidine Gluconate Cloth  6 each Topical Q0600  . feeding supplement (NEPRO CARB STEADY)  237 mL Oral TID BM  . feeding supplement (PRO-STAT SUGAR FREE 64)  30 mL Oral BID  . mexiletine   150 mg Oral Q12H  . midodrine  10 mg Oral TID WC  . multivitamin  1 tablet Oral QHS  . ranolazine  500 mg Oral BID  . sodium chloride flush  10-40 mL Intracatheter Q12H  . sodium chloride flush  3 mL Intravenous Q12H  . sodium chloride flush  3 mL Intravenous Q12H    have reviewed scheduled and prn medications.  Physical Exam: General: Lethargic, hypotensive, not following commands Heart: Regular rate rhythm, S1-S2 normal Lungs: Coarse breath sound bibasal Abdomen: Soft, nontender, nondistended Extremities: No edema. Dialysis Access: Right upper extremity aVF faint bruit with no radiation.   Dron Prasad Bhandari 04/20/2018,9:09 AM  LOS: 16 days

## 2018-04-20 NOTE — Progress Notes (Signed)
Noted pt having wide QRS c/o lethargy.  Pt was having twitching motion while sitting side of bed. Assisted pt back in bed.  Had difficulty taking BP. Called rapid response and paged Cardiology. EKG done.  BP 73/56 pulse 55-66 at 0825.  Pt initially refused to take any medicine or oxygine.  NP Jeraldine Loots on call called back.  Received OK to give scheduled midodrine.  NP Jeraldine Loots consulted Dr. Katrinka Blazing, and they would come see the pt shortly. Pulse Ox 93% O2 3L via .

## 2018-04-20 NOTE — Progress Notes (Signed)
ANTICOAGULATION CONSULT NOTE - Initial Consult  Pharmacy Consult for heparin (apixaban on hold) Indication: atrial fibrillation/flutter  Pharmacy Consult for vancomycin and cefepime Indication: sepsis   Allergies  Allergen Reactions  . Nsaids Other (See Comments)    Cannot take to due kidney issues  . Beta Adrenergic Blockers Other (See Comments)    An unnamed, white-colored Beta Blocker made him "feel funny" = made him feel "cagey"    Patient Measurements: Height: 5\' 9"  (175.3 cm) Weight: 241 lb 12.8 oz (109.7 kg) IBW/kg (Calculated) : 70.7  Vital Signs: BP: 70/47 (09/28 0904) Pulse Rate: 54 (09/28 0900)  Labs: Recent Labs    04/18/18 0350 04/19/18 0500 04/19/18 0640 04/20/18 0725  HGB 13.2 13.8  --  14.5  HCT 43.2 44.9  --  46.9  PLT 286 253  --  287  LABPROT  --   --  23.1*  --   INR  --   --  2.07  --   CREATININE 7.73* 6.64*  --  9.95*    Estimated Creatinine Clearance: 10 mL/min (A) (by C-G formula based on SCr of 9.95 mg/dL (H)).   Medical History: Past Medical History:  Diagnosis Date  . AICD (automatic cardioverter/defibrillator) present   . Anemia of chronic disease   . Arthritis   . Atrial flutter (HCC)    DCCV 9/15  . Chest pain on exertion   . Chronic systolic congestive heart failure, NYHA class 2 (HCC)    EF 20-25% 1/16  . CKD (chronic kidney disease) stage 3, GFR 30-59 ml/min (HCC)   . Dyslipidemia   . Gout    Of big toe  . Hyperglycemia   . Hyperlipidemia   . Hypertension   . Morbid obesity with BMI of 45.0-49.9, adult (HCC)   . Non-ischemic cardiomyopathy (HCC)   . Organic erectile dysfunction   . OSA (obstructive sleep apnea)   . Pilonidal cyst   . Submandibular sialolithiasis    Right   Assessment: 57 year old male s/p code blue this morning after refusing to go to HD.   Patient with history aflutter/cardioversion recently. Has been taking apixaban but given need for fistulagram this was stopped yesterday and we were  planning to hold for 48 hours. I would imagine that plan is currently on hold, d/w Dr.Bensimhon and will start IV heparin for now. Will monitor based on aptt.  Possible sepsis/HCAP, orders received during cath to start cefepime and vancomycin. Renal is now planning to start CRRT, will load with antibiotics today and follow up how he tolerates and redose as needed tomorrow.   Goal of Therapy:  Heparin level 0.3-0.7 units/ml aPTT 66-102 seconds Monitor platelets by anticoagulation protocol: Yes   Plan:  Start heparin infusion at 1100 units/hr Check aptt in 8 hours and daily while on heparin Continue to monitor H&H and platelets Cefepime 2g x1 now Vancomycin 2g IV now Redose antibiotics in am 9/29  Sheppard Coil PharmD., BCPS Clinical Pharmacist 04/20/2018 10:46 AM

## 2018-04-20 NOTE — Progress Notes (Signed)
Event note: Patient had code blue. He is now intubated, started on levophed and transferred to ICU.  His SBP in 90s with fluctuation in HR on pressor.  I discussed with multiple family members including his sister in ICU. She wants to try everything including CRRT. She understands that if his BP drops or became more unstable then we will stop CRRT.  Discussed with 2H ICU team.

## 2018-04-20 NOTE — Progress Notes (Signed)
MD notified of following ABG results: pH 7.67, pCO2 22.4, pO2 178, bicarb 26.1.  Orders for new vent settings given. Becky Flowers, RT notified of new orders.

## 2018-04-21 ENCOUNTER — Inpatient Hospital Stay (HOSPITAL_COMMUNITY): Payer: Medicaid Other

## 2018-04-21 DIAGNOSIS — J9600 Acute respiratory failure, unspecified whether with hypoxia or hypercapnia: Secondary | ICD-10-CM

## 2018-04-21 DIAGNOSIS — E440 Moderate protein-calorie malnutrition: Secondary | ICD-10-CM

## 2018-04-21 DIAGNOSIS — I469 Cardiac arrest, cause unspecified: Secondary | ICD-10-CM

## 2018-04-21 DIAGNOSIS — R570 Cardiogenic shock: Secondary | ICD-10-CM

## 2018-04-21 LAB — CBC
HCT: 42.3 % (ref 39.0–52.0)
HEMOGLOBIN: 13 g/dL (ref 13.0–17.0)
MCH: 29.5 pg (ref 26.0–34.0)
MCHC: 30.7 g/dL (ref 30.0–36.0)
MCV: 96.1 fL (ref 78.0–100.0)
Platelets: 245 10*3/uL (ref 150–400)
RBC: 4.4 MIL/uL (ref 4.22–5.81)
RDW: 17.2 % — ABNORMAL HIGH (ref 11.5–15.5)
WBC: 21.5 10*3/uL — AB (ref 4.0–10.5)

## 2018-04-21 LAB — POCT I-STAT, CHEM 8
BUN: 32 mg/dL — AB (ref 6–20)
Calcium, Ion: 1.03 mmol/L — ABNORMAL LOW (ref 1.15–1.40)
Chloride: 97 mmol/L — ABNORMAL LOW (ref 98–111)
Creatinine, Ser: 3.9 mg/dL — ABNORMAL HIGH (ref 0.61–1.24)
Glucose, Bld: 160 mg/dL — ABNORMAL HIGH (ref 70–99)
HEMATOCRIT: 47 % (ref 39.0–52.0)
HEMOGLOBIN: 16 g/dL (ref 13.0–17.0)
POTASSIUM: 4.1 mmol/L (ref 3.5–5.1)
SODIUM: 134 mmol/L — AB (ref 135–145)
TCO2: 27 mmol/L (ref 22–32)

## 2018-04-21 LAB — RENAL FUNCTION PANEL
Albumin: 2.6 g/dL — ABNORMAL LOW (ref 3.5–5.0)
Anion gap: 11 (ref 5–15)
BUN: 27 mg/dL — ABNORMAL HIGH (ref 6–20)
CHLORIDE: 98 mmol/L (ref 98–111)
CO2: 23 mmol/L (ref 22–32)
CREATININE: 3.55 mg/dL — AB (ref 0.61–1.24)
Calcium: 8.1 mg/dL — ABNORMAL LOW (ref 8.9–10.3)
GFR calc non Af Amer: 18 mL/min — ABNORMAL LOW (ref >60)
GFR, EST AFRICAN AMERICAN: 20 mL/min — AB (ref >60)
GLUCOSE: 123 mg/dL — AB (ref 70–99)
Phosphorus: 3.4 mg/dL (ref 2.5–4.6)
Potassium: 4.4 mmol/L (ref 3.5–5.1)
Sodium: 132 mmol/L — ABNORMAL LOW (ref 135–145)

## 2018-04-21 LAB — COMPREHENSIVE METABOLIC PANEL
ALK PHOS: 196 U/L — AB (ref 38–126)
ALT: 238 U/L — ABNORMAL HIGH (ref 0–44)
AST: 177 U/L — ABNORMAL HIGH (ref 15–41)
Albumin: 2.7 g/dL — ABNORMAL LOW (ref 3.5–5.0)
Anion gap: 12 (ref 5–15)
BILIRUBIN TOTAL: 2.6 mg/dL — AB (ref 0.3–1.2)
BUN: 38 mg/dL — ABNORMAL HIGH (ref 6–20)
CALCIUM: 8.2 mg/dL — AB (ref 8.9–10.3)
CO2: 28 mmol/L (ref 22–32)
Chloride: 94 mmol/L — ABNORMAL LOW (ref 98–111)
Creatinine, Ser: 4.82 mg/dL — ABNORMAL HIGH (ref 0.61–1.24)
GFR calc non Af Amer: 12 mL/min — ABNORMAL LOW (ref >60)
GFR, EST AFRICAN AMERICAN: 14 mL/min — AB (ref >60)
Glucose, Bld: 132 mg/dL — ABNORMAL HIGH (ref 70–99)
Potassium: 3.2 mmol/L — ABNORMAL LOW (ref 3.5–5.1)
SODIUM: 134 mmol/L — AB (ref 135–145)
TOTAL PROTEIN: 7 g/dL (ref 6.5–8.1)

## 2018-04-21 LAB — POCT I-STAT 3, ART BLOOD GAS (G3+)
Acid-Base Excess: 5 mmol/L — ABNORMAL HIGH (ref 0.0–2.0)
Bicarbonate: 32.2 mmol/L — ABNORMAL HIGH (ref 20.0–28.0)
O2 Saturation: 99 %
PCO2 ART: 50.7 mmHg — AB (ref 32.0–48.0)
PH ART: 7.403 (ref 7.350–7.450)
PO2 ART: 150 mmHg — AB (ref 83.0–108.0)
Patient temperature: 95.2
TCO2: 34 mmol/L — ABNORMAL HIGH (ref 22–32)

## 2018-04-21 LAB — CBC WITH DIFFERENTIAL/PLATELET
Abs Immature Granulocytes: 0.2 10*3/uL — ABNORMAL HIGH (ref 0.0–0.1)
BASOS ABS: 0.1 10*3/uL (ref 0.0–0.1)
Basophils Relative: 0 %
EOS ABS: 0 10*3/uL (ref 0.0–0.7)
EOS PCT: 0 %
HEMATOCRIT: 43 % (ref 39.0–52.0)
HEMOGLOBIN: 13.6 g/dL (ref 13.0–17.0)
Immature Granulocytes: 1 %
LYMPHS PCT: 8 %
Lymphs Abs: 1.3 10*3/uL (ref 0.7–4.0)
MCH: 29.5 pg (ref 26.0–34.0)
MCHC: 31.6 g/dL (ref 30.0–36.0)
MCV: 93.3 fL (ref 78.0–100.0)
MONO ABS: 2.3 10*3/uL — AB (ref 0.1–1.0)
Monocytes Relative: 14 %
Neutro Abs: 13 10*3/uL — ABNORMAL HIGH (ref 1.7–7.7)
Neutrophils Relative %: 77 %
Platelets: 247 10*3/uL (ref 150–400)
RBC: 4.61 MIL/uL (ref 4.22–5.81)
RDW: 17 % — AB (ref 11.5–15.5)
WBC: 17 10*3/uL — ABNORMAL HIGH (ref 4.0–10.5)

## 2018-04-21 LAB — MAGNESIUM: MAGNESIUM: 2.8 mg/dL — AB (ref 1.7–2.4)

## 2018-04-21 LAB — PROTIME-INR
INR: 2.54
Prothrombin Time: 27.2 seconds — ABNORMAL HIGH (ref 11.4–15.2)

## 2018-04-21 LAB — PHOSPHORUS: PHOSPHORUS: 4.1 mg/dL (ref 2.5–4.6)

## 2018-04-21 LAB — APTT: APTT: 127 s — AB (ref 24–36)

## 2018-04-21 MED ORDER — CHLORHEXIDINE GLUCONATE 0.12% ORAL RINSE (MEDLINE KIT)
15.0000 mL | Freq: Two times a day (BID) | OROMUCOSAL | Status: DC
  Administered 2018-04-21 – 2018-04-23 (×5): 15 mL via OROMUCOSAL

## 2018-04-21 MED ORDER — ORAL CARE MOUTH RINSE
15.0000 mL | OROMUCOSAL | Status: DC
  Administered 2018-04-21 – 2018-04-23 (×21): 15 mL via OROMUCOSAL

## 2018-04-21 MED ORDER — "THROMBI-PAD 3""X3"" EX PADS"
1.0000 | MEDICATED_PAD | Freq: Once | CUTANEOUS | Status: DC
  Filled 2018-04-21: qty 1

## 2018-04-21 MED ORDER — PRISMASOL BGK 4/2.5 32-4-2.5 MEQ/L IV SOLN
INTRAVENOUS | Status: DC
  Administered 2018-04-21 – 2018-04-23 (×5): via INTRAVENOUS_CENTRAL
  Filled 2018-04-21 (×4): qty 5000

## 2018-04-21 MED ORDER — PRISMASOL BGK 4/2.5 32-4-2.5 MEQ/L IV SOLN
INTRAVENOUS | Status: DC
  Administered 2018-04-21 – 2018-04-23 (×19): via INTRAVENOUS_CENTRAL
  Filled 2018-04-21 (×29): qty 5000

## 2018-04-21 MED ORDER — HEPARIN (PORCINE) IN NACL 100-0.45 UNIT/ML-% IJ SOLN: 800.0000 [IU]/h | INTRAMUSCULAR | Status: DC

## 2018-04-21 MED ORDER — POTASSIUM CHLORIDE 20 MEQ/15ML (10%) PO SOLN
40.0000 meq | ORAL | Status: AC
  Administered 2018-04-21: 40 meq
  Filled 2018-04-21 (×2): qty 30

## 2018-04-21 MED ORDER — HEPARIN (PORCINE) IN NACL 100-0.45 UNIT/ML-% IJ SOLN
800.0000 [IU]/h | INTRAMUSCULAR | Status: DC
  Administered 2018-04-21: 800 [IU]/h via INTRAVENOUS
  Administered 2018-04-23: 900 [IU]/h via INTRAVENOUS
  Filled 2018-04-21: qty 250

## 2018-04-21 MED ORDER — HEPARIN (PORCINE) IN NACL 100-0.45 UNIT/ML-% IJ SOLN
900.0000 [IU]/h | INTRAMUSCULAR | Status: DC
  Administered 2018-04-21: 900 [IU]/h via INTRAVENOUS

## 2018-04-21 MED ORDER — PRISMASOL BGK 4/2.5 32-4-2.5 MEQ/L IV SOLN
INTRAVENOUS | Status: DC
  Administered 2018-04-21 – 2018-04-23 (×4): via INTRAVENOUS_CENTRAL
  Filled 2018-04-21 (×7): qty 5000

## 2018-04-21 MED ORDER — SODIUM CHLORIDE 0.9 % IV SOLN
2.0000 g | Freq: Two times a day (BID) | INTRAVENOUS | Status: DC
  Administered 2018-04-21 – 2018-04-23 (×5): 2 g via INTRAVENOUS
  Filled 2018-04-21 (×6): qty 2

## 2018-04-21 MED ORDER — VANCOMYCIN HCL IN DEXTROSE 1-5 GM/200ML-% IV SOLN
1000.0000 mg | INTRAVENOUS | Status: DC
  Administered 2018-04-21 – 2018-04-23 (×3): 1000 mg via INTRAVENOUS
  Filled 2018-04-21 (×3): qty 200

## 2018-04-21 MED ORDER — "THROMBI-PAD 3""X3"" EX PADS"
1.0000 | MEDICATED_PAD | Freq: Once | CUTANEOUS | Status: AC
  Administered 2018-04-21: 1 via TOPICAL
  Filled 2018-04-21: qty 1

## 2018-04-21 MED FILL — Medication: Qty: 1 | Status: AC

## 2018-04-21 NOTE — Progress Notes (Signed)
Name: Eric Murillo MRN: [id number] DOB: 1960-08-13    ADMISSION DATE:  04/13/2018 CONSULTATION DATE: 12 20, 2019  REFERRING MD : Cardiology service CHIEF COMPLAINT: Post cardiac arrest due to PEA with bradycardia   HISTORY OF PRESENT ILLNESS:   Patient is a 57 year old African-American male with past medical history of end-stage renal disease hemodialysis noncompliant hypertension hyperlipidemia.  Patient is also known to have recurrent V. tach chronic systolic heart failure 20-25% nonischemic cardiomyopathy who was here since September 16 he was admitted this time with syncope.  9/16 Cath >> no significant CAD, acutely elevated right and left filling pressures  9/18 VT arrest. Amio drip restarted   9/28: PEA arrest, treated with CPR and epinephrine (no shocks) with ROSC.   ETT 9/28 >> [facility] HD cath 9/28 >> Rt fem CVL 9/28 >>  SUBJECTIVE: Remains critically ill, orally intubated, sedated on fentanyl drip, levo fed being tapered. On CRRT, hypothermic  VITAL SIGNS: Temp:  [94 F (34.4 C)-97.8 F (36.6 C)] 94.5 F (34.7 C) (09/29 0500) Pulse Rate:  [52-80] 58 (09/29 0800) Resp:  [5-24] 20 (09/29 0800) BP: (76-142)/(56-116) 97/68 (09/29 0800) SpO2:  [99 %-100 %] 100 % (09/29 0800) Arterial Line BP: (86-167)/(42-114) 94/46 (09/29 0900) FiO2 (%):  [40 %-100 %] 40 % (09/29 0729) Weight:  [109 kg] 109 kg (09/29 0645)  PHYSICAL EXAMINATION: Well-built, well-nourished, orally intubated, on CRRT No pallor icterus Pupils 3 mm bilaterally equally reactive to light Decreased breath sounds bilateral, no rhonchi. S1-S2 distant, no S3 no murmur Soft protuberant abdomen Sedated, opens eyes but does not follow commands, did follow commands per RN and sedation tapered   Recent Labs  Lab 04/20/18 1207 04/20/18 1814 04/21/18 0415  NA 130* 131* 134*  K 5.2* 4.0 3.2*  CL 86* 90* 94*  CO2 15* 21* 28  BUN 82* 63* 38*  CREATININE 10.22* 7.28* 4.82*  GLUCOSE 111* 106* 132*    Recent Labs  Lab 04/20/18 0725 04/20/18 1019 04/20/18 1115 04/21/18 0415  HGB 14.5 15.6 13.8 13.6  HCT 46.9 46.0 44.1 43.0  WBC 17.7*  --  22.4* 17.0*  PLT 287  --  260 247   Dg Abd 1 View  Result Date: 04/20/2018 CLINICAL DATA:  Feeding tube placement EXAM: ABDOMEN - 1 VIEW COMPARISON:  None. FINDINGS: There is a nasogastric tube projecting over the stomach. There is oral contrast material within the transverse and descending colon. There is no bowel dilatation to suggest obstruction. There is no evidence of pneumoperitoneum, portal venous gas or pneumatosis. There are no pathologic calcifications along the expected course of the ureters. The osseous structures are unremarkable. IMPRESSION: Nasogastric tube with the tip projecting over the stomach. Electronically Signed   By: Elige Ko   On: 04/20/2018 11:29   Dg Chest Port 1 View  Result Date: 04/21/2018 CLINICAL DATA:  Endotracheally intubated EXAM: PORTABLE CHEST 1 VIEW COMPARISON:  Endotracheal tube, esophageal tube, central venous line unchanged. FINDINGS: Endotracheal tube and central line unchanged. Normal cardiac silhouette. Low lung volumes. No edema, infiltrate, pneumothorax. IMPRESSION: 1. Stable support apparatus. 2.  No acute cardiopulmonary process. Electronically Signed   By: Genevive Bi M.D.   On: 04/21/2018 07:35   Dg Chest Port 1 View  Result Date: 04/20/2018 CLINICAL DATA:  Endotracheal tube repositioning EXAM: PORTABLE CHEST 1 VIEW COMPARISON:  April 20, 2018 study obtained earlier in the day FINDINGS: Endotracheal tube tip is now 2.9 cm above the carina. Nasogastric tube tip and side port are below  the diaphragm. Central catheter tip is at the junction of the left innominate vein and superior vena cava. No pneumothorax. Lungs are clear. Heart is slightly enlarged with pulmonary vascularity normal. Pacemaker lead is attached to the right ventricle. No adenopathy. No bone lesions. IMPRESSION: Tube and catheter  positions as described without evident pneumothorax. Lungs clear. Stable cardiac prominence. Electronically Signed   By: Bretta Bang III M.D.   On: 04/20/2018 13:56   Dg Chest Port 1 View  Result Date: 04/20/2018 CLINICAL DATA:  Respiratory difficulty EXAM: PORTABLE CHEST 1 VIEW COMPARISON:  04/18/2018 FINDINGS: Endotracheal tube placed. Tip is 5.6 cm from the carina. NG tube placed. The tip is in the fundus of the stomach. Left subclavian AICD device is stable. Left jugular dialysis catheter unchanged. Mild cardiomegaly. Clear lungs. No pneumothorax. IMPRESSION: Endotracheal and NG tubes placed as described. Clear lungs. Electronically Signed   By: Jolaine Click M.D.   On: 04/20/2018 11:05    ASSESSMENT / PLAN: Problems addressed on rounds today  Acute respiratory failure post cardiac arrest- Vent settings were reviewed and adjusted, can drop PEEP to 5 ABG reviewed which shows compensated hypercarbia Chest x-ray personally reviewed which shows ET tube in position and no new infiltrates bilateral lower lobe atelectasis. Can proceed with spontaneous breathing trials  OSA-  has not tolerated CPAP in the past--  Status post cardiac arrest PEA arrest with bradycardia most likely due to hyperkalemia  With missed dialysis. He remains on oral amiodarone, mexiletine and Ranexa for recurrent VT, has been seen by EP  Chronic systolic heart failure -being managed by cardiology, can taper Levophed to off, not a candidate for LVAD  ESRD -on CRRT currently but should be able to transition to intermittent dialysis once off pressors  Recurrent atrial flutter-on IV heparin but bleeding from femoral line, would suggest holding heparin for 2 hours and then restart.  Eliquis may still being around given his renal failure  HCAP -doubt based on chest x-ray, can limit antibiotics once WBC count improves and clinical improvement or use procalcitonin algorithm (although limited benefit  in ESRD)  My  critical care time x 70m  Cyril Mourning MD. FCCP. [facility] Pager [phone] If no response call [phone]    04/21/2018, 10:22 AM

## 2018-04-21 NOTE — Progress Notes (Signed)
ANTICOAGULATION CONSULT NOTE - Initial Consult  Pharmacy Consult for heparin (apixaban on hold) Indication: atrial fibrillation/flutter  Allergies  Allergen Reactions  . Nsaids Other (See Comments)    Cannot take to due kidney issues  . Beta Adrenergic Blockers Other (See Comments)    An unnamed, white-colored Beta Blocker made him "feel funny" = made him feel "cagey"    Patient Measurements: Height: 5\' 9"  (175.3 cm) Weight: 240 lb 4.8 oz (109 kg) IBW/kg (Calculated) : 70.7  Vital Signs: Temp: 94.5 F (34.7 C) (09/29 0500) Temp Source: Axillary (09/29 0500) BP: 87/57 (09/29 0729) Pulse Rate: 54 (09/29 0729)  Labs: Recent Labs    04/19/18 0640 04/20/18 0725 04/20/18 1003 04/20/18 1019 04/20/18 1115 04/20/18 1207 04/20/18 1814 04/20/18 2331 04/21/18 0415  HGB  --  14.5  --  15.6 13.8  --   --   --  13.6  HCT  --  46.9  --  46.0 44.1  --   --   --  43.0  PLT  --  287  --   --  260  --   --   --  247  APTT  --   --  35  --   --  33  --  127*  --   LABPROT 23.1*  --   --   --   --   --   --   --  27.2*  INR 2.07  --   --   --   --   --   --   --  2.54  CREATININE  --  9.95*  --  10.20*  --  10.22* 7.28*  --  4.82*    Estimated Creatinine Clearance: 20.6 mL/min (A) (by C-G formula based on SCr of 4.82 mg/dL (H)).   Medical History: Past Medical History:  Diagnosis Date  . AICD (automatic cardioverter/defibrillator) present   . Anemia of chronic disease   . Arthritis   . Atrial flutter (HCC)    DCCV 9/15  . Chest pain on exertion   . Chronic systolic congestive heart failure, NYHA class 2 (HCC)    EF 20-25% 1/16  . CKD (chronic kidney disease) stage 3, GFR 30-59 ml/min (HCC)   . Dyslipidemia   . Gout    Of big toe  . Hyperglycemia   . Hyperlipidemia   . Hypertension   . Morbid obesity with BMI of 45.0-49.9, adult (HCC)   . Non-ischemic cardiomyopathy (HCC)   . Organic erectile dysfunction   . OSA (obstructive sleep apnea)   . Pilonidal cyst   .  Submandibular sialolithiasis    Right   Assessment: 57 year old male s/p code blue this morning after refusing to go to HD.   Patient with history aflutter/cardioversion recently. Has been taking apixaban but given need for fistulagram this was stopped 9/27 and plan was to hold for 48 hours. Plans for fistulagram on hold indefinitely. Heparin to continue for now. Profuse bleeding from femoral IV site noted this morning and heparin held for a few hours. Site looks much better now, d/w cards will restart heparin at lower dose. CBC stable overnight, INR elevated at 2.5.   Will monitor heparin based on aptt.  Possible sepsis/HCAP, orders received during code 9/28 to start cefepime and vancomycin. CRRT started and continued overnight. Will adjust antibiotics accordingly. CXR clear.   Goal of Therapy:  Heparin level 0.3-0.7 units/ml aPTT 66-102 seconds Monitor platelets by anticoagulation protocol: Yes   Plan:  Restart heparin infusion at 900 units/hr Check aptt in 8 hours and daily while on heparin Continue to monitor H&H and platelets Cefepime 2g q12 Vancomycin 1g q24  Sheppard Coil PharmD., BCPS Clinical Pharmacist 04/21/2018 8:08 AM

## 2018-04-21 NOTE — Progress Notes (Addendum)
Patient ID: Eric Murillo, male   DOB: 11-May-1961, 57 y.o.   MRN: [id number]     Advanced Heart Failure Rounding Note  PCP-Cardiologist: Loralie Champagne, MD   Subjective:    Presented to [facility] ED after a syncopal episode. 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 Transferred to [facility] and had another episode of VT in ED that required ATP.   9/18 VT arrest. Amio drip restarted but now back to po. No further arrythmias.   9/28: PEA arrest, treated with CPR and epinephrine (no shocks) with ROSC.  He is now intubated on norepinephrine 9 and back on CVVH.   RHC/LHC 04/18/2018  1. Low, but not markedly low, cardiac output.  2. Pulmonary venous hypertension.  3. Markedly elevated right and left heart filling pressures.   4. No significant coronary disease.   RHC Procedural Findings: Hemodynamics (mmHg) RA mean 18 RV 65/17 PA 69/30, mean 47 PCWP mean 36 LV 100/27 AO 97/65 Oxygen saturations: PA 52% AO 94% Cardiac Output (Fick) 4.98  Cardiac Index (Fick) 2.12 PVR 2.2 WU Cardiac Output (Thermo) 4.85 Cardiac Index (Thermo) 2.06  PVR 2.3 WU   Objective:   Weight Range: 109 kg Body mass index is 35.49 kg/m.   Vital Signs:   Temp:  [94 F (34.4 C)-97.8 F (36.6 C)] 94.5 F (34.7 C) (09/29 0500) Pulse Rate:  [52-80] 54 (09/29 0729) Resp:  [5-24] 21 (09/29 0729) BP: (70-142)/(47-116) 87/57 (09/29 0729) SpO2:  [93 %-100 %] 100 % (09/29 0729) Arterial Line BP: (86-167)/(42-114) 98/46 (09/29 0715) FiO2 (%):  [40 %-100 %] 40 % (09/29 0729) Weight:  [109 kg] 109 kg (09/29 0645) Last BM Date: 04/15/18  Weight change: Filed Weights   04/19/18 0531 04/20/18 0549 04/21/18 0645  Weight: 108.8 kg 109.7 kg 109 kg    Intake/Output:   Intake/Output Summary (Last 24 hours) at 04/21/2018 0734 Last data filed at 04/21/2018 0700 Gross per 24 hour  Intake 3919.43 ml  Output 3322 ml  Net 597.43 ml      Physical Exam   General: NAD, intubated Neck: Thick, JVP  difficult, no thyromegaly or thyroid nodule.  Lungs: Clear anteriorly  CV: Nonpalpable PMI.  Heart regular S1/S2, no S3/S4, no murmur.  Trace ankle edema.   Skin: Intact without lesions or rashes.  Neurologic: Alert and oriented x 3.  Psych: Normal affect. Extremities: No clubbing or cyanosis.  HEENT: Normal.    Telemetry   NSR 50, personally reviewed.   Labs    CBC Recent Labs    04/20/18 1115 04/21/18 0415  WBC 22.4* 17.0*  NEUTROABS 20.4* 13.0*  HGB 13.8 13.6  HCT 44.1 43.0  MCV 95.7 93.3  PLT 260 841   Basic Metabolic Panel Recent Labs    04/20/18 1814 04/21/18 0415  NA 131* 134*  K 4.0 3.2*  CL 90* 94*  CO2 21* 28  GLUCOSE 106* 132*  BUN 63* 38*  CREATININE 7.28* 4.82*  CALCIUM 9.1 8.2*  MG  --  2.8*  PHOS 3.7 4.1   Liver Function Tests Recent Labs    04/20/18 1207 04/20/18 1814 04/21/18 0415  AST 165*  --  177*  ALT 235*  --  238*  ALKPHOS 206*  --  196*  BILITOT 2.2*  --  2.6*  PROT 7.0  --  7.0  ALBUMIN 2.7* 2.9* 2.7*   No results for input(s): LIPASE, AMYLASE in the last 72 hours. Cardiac Enzymes No results for input(s): CKTOTAL, CKMB, CKMBINDEX, TROPONINI in  the last 72 hours.  BNP: BNP (last 3 results) Recent Labs    07/26/17 0916 11/03/17 1238 11/13/17 0400  BNP 399.7* 1,160.1* 1,085.3*    ProBNP (last 3 results) No results for input(s): PROBNP in the last 8760 hours.   D-Dimer No results for input(s): DDIMER in the last 72 hours. Hemoglobin A1C No results for input(s): HGBA1C in the last 72 hours. Fasting Lipid Panel No results for input(s): CHOL, HDL, LDLCALC, TRIG, CHOLHDL, LDLDIRECT in the last 72 hours. Thyroid Function Tests No results for input(s): TSH, T4TOTAL, T3FREE, THYROIDAB in the last 72 hours.  Invalid input(s): FREET3  Other results:   Imaging    Dg Abd 1 View  Result Date: 04/20/2018 CLINICAL DATA:  Feeding tube placement EXAM: ABDOMEN - 1 VIEW COMPARISON:  None. FINDINGS: There is a nasogastric  tube projecting over the stomach. There is oral contrast material within the transverse and descending colon. There is no bowel dilatation to suggest obstruction. There is no evidence of pneumoperitoneum, portal venous gas or pneumatosis. There are no pathologic calcifications along the expected course of the ureters. The osseous structures are unremarkable. IMPRESSION: Nasogastric tube with the tip projecting over the stomach. Electronically Signed   By: Kathreen Devoid   On: 04/20/2018 11:29   Dg Chest Port 1 View  Result Date: 04/20/2018 CLINICAL DATA:  Endotracheal tube repositioning EXAM: PORTABLE CHEST 1 VIEW COMPARISON:  April 20, 2018 study obtained earlier in the day FINDINGS: Endotracheal tube tip is now 2.9 cm above the carina. Nasogastric tube tip and side port are below the diaphragm. Central catheter tip is at the junction of the left innominate vein and superior vena cava. No pneumothorax. Lungs are clear. Heart is slightly enlarged with pulmonary vascularity normal. Pacemaker lead is attached to the right ventricle. No adenopathy. No bone lesions. IMPRESSION: Tube and catheter positions as described without evident pneumothorax. Lungs clear. Stable cardiac prominence. Electronically Signed   By: Lowella Grip III M.D.   On: 04/20/2018 13:56   Dg Chest Port 1 View  Result Date: 04/20/2018 CLINICAL DATA:  Respiratory difficulty EXAM: PORTABLE CHEST 1 VIEW COMPARISON:  04/18/2018 FINDINGS: Endotracheal tube placed. Tip is 5.6 cm from the carina. NG tube placed. The tip is in the fundus of the stomach. Left subclavian AICD device is stable. Left jugular dialysis catheter unchanged. Mild cardiomegaly. Clear lungs. No pneumothorax. IMPRESSION: Endotracheal and NG tubes placed as described. Clear lungs. Electronically Signed   By: Marybelle Killings M.D.   On: 04/20/2018 11:05     Medications:     Scheduled Medications: . allopurinol  100 mg Oral BID  . amiodarone  200 mg Oral BID  .  calcitRIOL  0.5 mcg Oral Q T,Th,Sa-HD  . calcium acetate  1,334 mg Oral TID WC  . chlorhexidine gluconate (MEDLINE KIT)  15 mL Mouth Rinse BID  . Chlorhexidine Gluconate Cloth  6 each Topical Q0600  . feeding supplement (NEPRO CARB STEADY)  237 mL Oral TID BM  . feeding supplement (PRO-STAT SUGAR FREE 64)  30 mL Oral BID  . fentaNYL (SUBLIMAZE) injection  50 mcg Intravenous Once  . mouth rinse  15 mL Mouth Rinse 10 times per day  . mexiletine  150 mg Oral Q12H  . midodrine  10 mg Oral TID WC  . multivitamin  1 tablet Oral QHS  . potassium chloride  40 mEq Per Tube Q4H  . ranolazine  500 mg Oral BID  . sodium chloride flush  10-40 mL  Intracatheter Q12H  . sodium chloride flush  3 mL Intravenous Q12H  . sodium chloride flush  3 mL Intravenous Q12H    Infusions: . sodium chloride    . sodium chloride    . sodium chloride    . epinephrine Stopped (04/20/18 1027)  . fentaNYL infusion INTRAVENOUS 200 mcg/hr (04/21/18 0700)  . heparin Stopped (04/21/18 0546)  . heparin    . norepinephrine (LEVOPHED) Adult infusion 10 mcg/min (04/21/18 0700)  . dialysis replacement fluid (prismasate) 500 mL/hr at 04/20/18 2243  . dialysis replacement fluid (prismasate) 300 mL/hr at 04/21/18 0300  . dialysate (PRISMASATE) 2,000 mL/hr at 04/21/18 0646  .  sodium bicarbonate  infusion 1000 mL 125 mL/hr at 04/21/18 0700    PRN Medications: sodium chloride, sodium chloride, sodium chloride, acetaminophen, alteplase, bismuth subsalicylate, docusate, fentaNYL, heparin, heparin, HYDROcodone-acetaminophen, lidocaine (PF), lidocaine-prilocaine, midazolam, midazolam, ondansetron (ZOFRAN) IV, pentafluoroprop-tetrafluoroeth, promethazine, senna-docusate, sodium chloride flush, sodium chloride flush, sorbitol    Patient Profile   Eric Murillo is a 57 y.o. male with history of morbid obesity, hyperlipidemia, HTN, atrial flutter (s/p TEE DC-CV 04/03/14 and again in 8/17), NICM with chronic systolic HF and severe OSA  but unable to tolerate CPAP.    Assessment/Plan   1. PEA arrest: 9/28.  Patient now intubated and back on CVVH/norepinephrine. ?Development of septic shock with rise in WBCs.  Temperature is actually low, WBCs down to 17.  Prognosis at this point is very poor, patient is still full code.  Will need to wean sedation to assess mental status at this point.  2. Recurrent VT requiring shock and ATP: Follows with Dr Caryl Comes. EP has been consulted. Last saw Dr Caryl Comes 04/03/18 and was having recurrent episodes of VF. Mexilitine was added in addition to his amio.  Coronary angiography this admission showed no CAD. Cardiac output was low but not markedly so.  Concern for VT as sign of end stage cardiomyopathy.   - No further VT.   - On po amiodarone, mexilitene and Ranexa.  3. Chronic systolic heart failure: Nonischemic cardiomyopathy, likely related to HTN.  Echo in 8/14 with EF 45% but EF down to 25-30% on TEE while in atrial flutter (03/2014).  Echo (1/16) with EF ~25% and echo in 6/16 with EF 30-35%.  No cardiac MRI done with elevated creatinine and size. There was concern for cardiac amyloidosis.  However, negative SPEP and abdominal fat pad biopsy negative. Low voltage on ECG may be due to obesity and not amyloidosis. Echo 10/2017: EF 20-25%, mild to mod MR. S/P [employer] ICD.  Marked volume overload by RHC this admission.  Unable get fluid off with HD initially due to hypotension and required CVVH.  Improved to the point where he could go back on iHD, but then had PEA arrest and now back on norepinephrine + CVVH.  - CVVH for volume management.  - Continue midodrine 10 mg tid.  - Wean norepinephrine as tolerated, eventually need to try to get him back to [facility] if he has neurological recovery.  - He has end stage HF, not candidate for LVAD and unlikely to be able to get to heart/kidney transplant.  4. Atypical Atrial Flutter: Recurrent.  S/p several DCCVs, most recently 11/21/17. Has been seen by EP. Not felt to be  amenable to ablation.  - Maintaining NSR.  - Restart heparin gtt, can transition to Eliquis again eventually if no bleeding.  5. ESRD: Now on HD T/R/Sat.  Intolerant iHD earlier this admission, hypotensive when trying  to pull off excess fluid and had VT. He initially required norepinephrine and CVVH, was back on iHD but with PEA arrest 9/28, he is not back on norepinephrine and CVVH.  6. OSA: Does not tolerate CPAP.  7. Morbid Obesity:   Body mass index is 35.49 kg/m.  8. ID: Afebrile, WBCs have been elevated but lower today at 17.  CXR is clear.  No positive cultures today. He got antibiotics yesterday with arrest, ?component of septic shock as WBCs had been rising.  Will have to follow cultures and fever curve closely.   - Continue vanc/cefepime for now.  9. Pill dysphagia: Barium swallow 9/25 was normal.   Poor prognosis.  Will need to discuss code status again with family when they arrive.   CRITICAL CARE Performed by: Loralie Champagne  Total critical care time: 35 minutes  Critical care time was exclusive of separately billable procedures and treating other patients.  Critical care was necessary to treat or prevent imminent or life-threatening deterioration.  Critical care was time spent personally by me on the following activities: development of treatment plan with patient and/or surrogate as well as nursing, discussions with consultants, evaluation of patient's response to treatment, examination of patient, obtaining history from patient or surrogate, ordering and performing treatments and interventions, ordering and review of laboratory studies, ordering and review of radiographic studies, pulse oximetry and re-evaluation of patient's condition.  Loralie Champagne 04/21/2018  7:41 AM

## 2018-04-21 NOTE — Progress Notes (Signed)
ANTICOAGULATION CONSULT NOTE   Pharmacy Consult for Heparin (apixaban on hold) Indication: atrial fibrillation/flutter   Allergies  Allergen Reactions  . Nsaids Other (See Comments)    Cannot take to due kidney issues  . Beta Adrenergic Blockers Other (See Comments)    An unnamed, white-colored Beta Blocker made him "feel funny" = made him feel "cagey"    Patient Measurements: Height: 5\' 9"  (175.3 cm) Weight: 241 lb 12.8 oz (109.7 kg) IBW/kg (Calculated) : 70.7  Vital Signs: Temp: 96 F (35.6 C) (09/28 2300) Temp Source: Oral (09/28 2300) BP: 95/69 (09/29 0200) Pulse Rate: 53 (09/29 0200)  Labs: Recent Labs    04/19/18 0500 04/19/18 0640 04/20/18 0725 04/20/18 1003 04/20/18 1019 04/20/18 1115 04/20/18 1207 04/20/18 1814 04/20/18 2331  HGB 13.8  --  14.5  --  15.6 13.8  --   --   --   HCT 44.9  --  46.9  --  46.0 44.1  --   --   --   PLT 253  --  287  --   --  260  --   --   --   APTT  --   --   --  35  --   --  33  --  127*  LABPROT  --  23.1*  --   --   --   --   --   --   --   INR  --  2.07  --   --   --   --   --   --   --   CREATININE 6.64*  --  9.95*  --  10.20*  --  10.22* 7.28*  --     Estimated Creatinine Clearance: 13.7 mL/min (A) (by C-G formula based on SCr of 7.28 mg/dL (H)).   Medical History: Past Medical History:  Diagnosis Date  . AICD (automatic cardioverter/defibrillator) present   . Anemia of chronic disease   . Arthritis   . Atrial flutter (HCC)    DCCV 9/15  . Chest pain on exertion   . Chronic systolic congestive heart failure, NYHA class 2 (HCC)    EF 20-25% 1/16  . CKD (chronic kidney disease) stage 3, GFR 30-59 ml/min (HCC)   . Dyslipidemia   . Gout    Of big toe  . Hyperglycemia   . Hyperlipidemia   . Hypertension   . Morbid obesity with BMI of 45.0-49.9, adult (HCC)   . Non-ischemic cardiomyopathy (HCC)   . Organic erectile dysfunction   . OSA (obstructive sleep apnea)   . Pilonidal cyst   . Submandibular  sialolithiasis    Right   Assessment: 57 year old male s/p code blue this morning after refusing to go to HD.   Patient with history aflutter/cardioversion recently. Has been taking apixaban but given need for fistulagram this was stopped yesterday and we were planning to hold for 48 hours. I would imagine that plan is currently on hold, d/w Dr.Bensimhon and will start IV heparin for now. Will monitor based on aptt.  9/29 AM update: aPTT is elevated this AM, some groin bleeding (currently stabilized with thrombi-pad)>>continue to monitor  Goal of Therapy:  Heparin level 0.3-0.7 units/ml aPTT 66-102 seconds Monitor platelets by anticoagulation protocol: Yes   Plan:  Dec heparin to 950 units/hr 1000 aPTT/HL Monitor for any further groin bleeding  Abran Duke, PharmD, BCPS Clinical Pharmacist Phone: [phone]

## 2018-04-21 NOTE — Progress Notes (Signed)
[facility] NEPHROLOGY PROGRESS NOTE  Assessment/ Plan: Pt is a 57 y.o. yo male ESRD on HD TTS, with syncopal episode, recurrent VT requiring shock.  HD: [facility]/ TTS 4.5h  122.5 kg  2/2 bath Hep none  RUA AVF - calc 0.5 ug tiw - venofer 7/10 completed 100 mg qhd  #Recurrent VT, chronic systolic heart failure with EF of 25 to 30%, nonischemic cardiomyopathy: Filling pressures very high on right heart cath.  Currently on amiodarone, mexiletine. Cardiology following.   # PEA arrest on 9/28 s/p intubation and transferred to ICU.  Poor prognosis.  Discussed with the family and cardiologist today.  Currently on pressors.  # ESRD: HD TTS.  He was on CRRT from 9/18-9/23, then tried intermittent hemodialysis few times however had another cardiac arrest yesterday.  Now back on CRRT.  Changed potassium bath to 4K today.  Trying 50-100cc/hr ultrafiltration as tolerated.  On levo.  Discontinue IV sodium bicarbonate fluid. Initially planned for a fistulogram on Monday however patient is too unstable this point. He may not be candidate for outpatient dialysis.  I have discussed this with multiple family members today. -prognosis is extremely poor.   He has temporary catheter.   # Hyperkalemia: Improved after dialysis.  # Anemia: Hemoglobin acceptable.   # Secondary hyperparathyroidism: Discontinue PhosLo since patient is on CRRT.  Monitor calcium and phosphorus.    # Hypotension/volume: levophed iv,  On midodrine 15 mg 3 times daily.   Subjective:  Seen and examined in ICU.  Patient is intubated, sedated and on pressors.  On CRRT with systolic blood pressure around 100.  Objective Vital signs in last 24 hours: Vitals:   04/21/18 0700 04/21/18 0715 04/21/18 0729 04/21/18 0800  BP: (!) 87/57  (!) 87/57 97/68  Pulse: (!) 54 (!) 53 (!) 54 (!) 58  Resp: 20 20 (!) 21 20  Temp:      TempSrc:      SpO2: 100% 100% 100% 100%  Weight:      Height:       Weight change: -0.68  kg  Intake/Output Summary (Last 24 hours) at 04/21/2018 0942 Last data filed at 04/21/2018 0900 Gross per 24 hour  Intake 4295.08 ml  Output 3682 ml  Net 613.08 ml       Labs: Basic Metabolic Panel: Recent Labs  Lab 04/20/18 0725  04/20/18 1207 04/20/18 1814 04/21/18 0415  NA 126*   < > 130* 131* 134*  K 6.2*   < > 5.2* 4.0 3.2*  CL 84*   < > 86* 90* 94*  CO2 20*  --  15* 21* 28  GLUCOSE 107*   < > 111* 106* 132*  BUN 77*   < > 82* 63* 38*  CREATININE 9.95*   < > 10.22* 7.28* 4.82*  CALCIUM 9.7  --  10.4* 9.1 8.2*  PHOS 10.1*  --   --  3.7 4.1   < > = values in this interval not displayed.   Liver Function Tests: Recent Labs  Lab 04/20/18 1207 04/20/18 1814 04/21/18 0415  AST 165*  --  177*  ALT 235*  --  238*  ALKPHOS 206*  --  196*  BILITOT 2.2*  --  2.6*  PROT 7.0  --  7.0  ALBUMIN 2.7* 2.9* 2.7*   No results for input(s): LIPASE, AMYLASE in the last 168 hours. No results for input(s): AMMONIA in the last 168 hours. CBC: Recent Labs  Lab 04/18/18 0350 04/19/18 0500 04/20/18  0725 04/20/18 1019 04/20/18 1115 04/21/18 0415  WBC 17.4* 16.1* 17.7*  --  22.4* 17.0*  NEUTROABS 12.6* 11.7* 13.3*  --  20.4* 13.0*  HGB 13.2 13.8 14.5 15.6 13.8 13.6  HCT 43.2 44.9 46.9 46.0 44.1 43.0  MCV 96.2 96.4 95.3  --  95.7 93.3  PLT 286 253 287  --  260 247   Cardiac Enzymes: No results for input(s): CKTOTAL, CKMB, CKMBINDEX, TROPONINI in the last 168 hours. CBG: Recent Labs  Lab 04/20/18 1611  GLUCAP 121*    Iron Studies: No results for input(s): IRON, TIBC, TRANSFERRIN, FERRITIN in the last 72 hours. Studies/Results: Dg Abd 1 View  Result Date: 04/20/2018 CLINICAL DATA:  Feeding tube placement EXAM: ABDOMEN - 1 VIEW COMPARISON:  None. FINDINGS: There is a nasogastric tube projecting over the stomach. There is oral contrast material within the transverse and descending colon. There is no bowel dilatation to suggest obstruction. There is no evidence of  pneumoperitoneum, portal venous gas or pneumatosis. There are no pathologic calcifications along the expected course of the ureters. The osseous structures are unremarkable. IMPRESSION: Nasogastric tube with the tip projecting over the stomach. Electronically Signed   By: Kathreen Devoid   On: 04/20/2018 11:29   Dg Chest Port 1 View  Result Date: 04/21/2018 CLINICAL DATA:  Endotracheally intubated EXAM: PORTABLE CHEST 1 VIEW COMPARISON:  Endotracheal tube, esophageal tube, central venous line unchanged. FINDINGS: Endotracheal tube and central line unchanged. Normal cardiac silhouette. Low lung volumes. No edema, infiltrate, pneumothorax. IMPRESSION: 1. Stable support apparatus. 2.  No acute cardiopulmonary process. Electronically Signed   By: Suzy Bouchard M.D.   On: 04/21/2018 07:35   Dg Chest Port 1 View  Result Date: 04/20/2018 CLINICAL DATA:  Endotracheal tube repositioning EXAM: PORTABLE CHEST 1 VIEW COMPARISON:  April 20, 2018 study obtained earlier in the day FINDINGS: Endotracheal tube tip is now 2.9 cm above the carina. Nasogastric tube tip and side port are below the diaphragm. Central catheter tip is at the junction of the left innominate vein and superior vena cava. No pneumothorax. Lungs are clear. Heart is slightly enlarged with pulmonary vascularity normal. Pacemaker lead is attached to the right ventricle. No adenopathy. No bone lesions. IMPRESSION: Tube and catheter positions as described without evident pneumothorax. Lungs clear. Stable cardiac prominence. Electronically Signed   By: Lowella Grip III M.D.   On: 04/20/2018 13:56   Dg Chest Port 1 View  Result Date: 04/20/2018 CLINICAL DATA:  Respiratory difficulty EXAM: PORTABLE CHEST 1 VIEW COMPARISON:  04/18/2018 FINDINGS: Endotracheal tube placed. Tip is 5.6 cm from the carina. NG tube placed. The tip is in the fundus of the stomach. Left subclavian AICD device is stable. Left jugular dialysis catheter unchanged. Mild  cardiomegaly. Clear lungs. No pneumothorax. IMPRESSION: Endotracheal and NG tubes placed as described. Clear lungs. Electronically Signed   By: Marybelle Killings M.D.   On: 04/20/2018 11:05    Medications: Infusions: . sodium chloride    . sodium chloride    . sodium chloride    . ceFEPime (MAXIPIME) IV 2 g (04/21/18 0908)  . epinephrine Stopped (04/20/18 1027)  . fentaNYL infusion INTRAVENOUS 150 mcg/hr (04/21/18 0900)  . heparin Stopped (04/21/18 0932)  . heparin    . norepinephrine (LEVOPHED) Adult infusion 12 mcg/min (04/21/18 0923)  . dialysis replacement fluid (prismasate) 500 mL/hr at 04/21/18 0839  . dialysis replacement fluid (prismasate) 300 mL/hr at 04/21/18 0838  . dialysate (PRISMASATE) 2,000 mL/hr at 04/21/18 1601  .  sodium bicarbonate  infusion 1000 mL Stopped (04/21/18 0838)  . vancomycin      Scheduled Medications: . allopurinol  100 mg Oral BID  . amiodarone  200 mg Oral BID  . calcitRIOL  0.5 mcg Oral Q T,Th,Sa-HD  . calcium acetate  1,334 mg Oral TID WC  . chlorhexidine gluconate (MEDLINE KIT)  15 mL Mouth Rinse BID  . Chlorhexidine Gluconate Cloth  6 each Topical Q0600  . feeding supplement (NEPRO CARB STEADY)  237 mL Oral TID BM  . feeding supplement (PRO-STAT SUGAR FREE 64)  30 mL Oral BID  . fentaNYL (SUBLIMAZE) injection  50 mcg Intravenous Once  . mouth rinse  15 mL Mouth Rinse 10 times per day  . mexiletine  150 mg Oral Q12H  . midodrine  10 mg Oral TID WC  . multivitamin  1 tablet Oral QHS  . potassium chloride  40 mEq Per Tube Q4H  . ranolazine  500 mg Oral BID  . sodium chloride flush  10-40 mL Intracatheter Q12H  . sodium chloride flush  3 mL Intravenous Q12H  . sodium chloride flush  3 mL Intravenous Q12H    have reviewed scheduled and prn medications.  Physical Exam: General: Lying in bed, intubated, sedated Heart: Regular rate rhythm, S1-S2 normal Lungs: Coarse breath sound bilateral  abdomen: Soft, nontender, nondistended Extremities: No  edema. Dialysis Access: Right upper extremity aVF faint bruit with no radiation.  Has temporary catheter.  Dron Prasad Bhandari 04/21/2018,9:42 AM  LOS: 17 days

## 2018-04-21 NOTE — Progress Notes (Signed)
eLink Physician-Brief Progress Note Patient Name: Eric Murillo DOB: 1960/10/25 MRN: [id number]   Date of Service  04/21/2018  HPI/Events of Note  Hypokalemia  eICU Interventions  Potassium replaced     Intervention Category Intermediate Interventions: Electrolyte abnormality - evaluation and management  Ordell Prichett 04/21/2018, 6:16 AM

## 2018-04-21 NOTE — Progress Notes (Addendum)
Right femoral CVC site bleeding.  Pharmacy and Dr. Vassie Loll notified.  Heparin stopped and pressure held at femoral site.  Unable to stop oozing at insertion site and verbal orders received to DC line.  Hematoma developed but able to reduce with pressure.  Bilateral pedal pulses dopplerable. Site level 1 and soft.  IV heparin to resume 6 hours post CVC removal.  Gyneth Hubka, Mitzi Hansen

## 2018-04-22 DIAGNOSIS — J9601 Acute respiratory failure with hypoxia: Secondary | ICD-10-CM

## 2018-04-22 DIAGNOSIS — Z515 Encounter for palliative care: Secondary | ICD-10-CM

## 2018-04-22 LAB — CBC
HCT: 42.6 % (ref 39.0–52.0)
Hemoglobin: 13.2 g/dL (ref 13.0–17.0)
MCH: 30.1 pg (ref 26.0–34.0)
MCHC: 31 g/dL (ref 30.0–36.0)
MCV: 97 fL (ref 78.0–100.0)
Platelets: 241 10*3/uL (ref 150–400)
RBC: 4.39 MIL/uL (ref 4.22–5.81)
RDW: 17.2 % — ABNORMAL HIGH (ref 11.5–15.5)
WBC: 25.6 10*3/uL — ABNORMAL HIGH (ref 4.0–10.5)

## 2018-04-22 LAB — RENAL FUNCTION PANEL
ANION GAP: 11 (ref 5–15)
Albumin: 2.6 g/dL — ABNORMAL LOW (ref 3.5–5.0)
Albumin: 2.5 g/dL — ABNORMAL LOW (ref 3.5–5.0)
Anion gap: 13 (ref 5–15)
BUN: 22 mg/dL — AB (ref 6–20)
BUN: 22 mg/dL — AB (ref 6–20)
CALCIUM: 8.5 mg/dL — AB (ref 8.9–10.3)
CHLORIDE: 100 mmol/L (ref 98–111)
CHLORIDE: 99 mmol/L (ref 98–111)
CO2: 22 mmol/L (ref 22–32)
CO2: 21 mmol/L — AB (ref 22–32)
CREATININE: 3.02 mg/dL — AB (ref 0.61–1.24)
Calcium: 8.4 mg/dL — ABNORMAL LOW (ref 8.9–10.3)
Creatinine, Ser: 3.23 mg/dL — ABNORMAL HIGH (ref 0.61–1.24)
GFR calc non Af Amer: 20 mL/min — ABNORMAL LOW (ref >60)
GFR calc non Af Amer: 21 mL/min — ABNORMAL LOW (ref >60)
GFR, EST AFRICAN AMERICAN: 23 mL/min — AB (ref >60)
GFR, EST AFRICAN AMERICAN: 25 mL/min — AB (ref >60)
GLUCOSE: 116 mg/dL — AB (ref 70–99)
Glucose, Bld: 122 mg/dL — ABNORMAL HIGH (ref 70–99)
PHOSPHORUS: 3.4 mg/dL (ref 2.5–4.6)
POTASSIUM: 5.2 mmol/L — AB (ref 3.5–5.1)
Phosphorus: 3.6 mg/dL (ref 2.5–4.6)
Potassium: 4.8 mmol/L (ref 3.5–5.1)
SODIUM: 133 mmol/L — AB (ref 135–145)
Sodium: 133 mmol/L — ABNORMAL LOW (ref 135–145)

## 2018-04-22 LAB — APTT
aPTT: 62 seconds — ABNORMAL HIGH (ref 24–36)
aPTT: 95 seconds — ABNORMAL HIGH (ref 24–36)

## 2018-04-22 LAB — PROTIME-INR
INR: 2
Prothrombin Time: 22.5 seconds — ABNORMAL HIGH (ref 11.4–15.2)

## 2018-04-22 LAB — HEPARIN LEVEL (UNFRACTIONATED)

## 2018-04-22 LAB — MAGNESIUM: Magnesium: 2.5 mg/dL — ABNORMAL HIGH (ref 1.7–2.4)

## 2018-04-22 MED ORDER — VASOPRESSIN 20 UNIT/ML IV SOLN
0.0300 [IU]/min | INTRAVENOUS | Status: DC
  Administered 2018-04-23: 0.03 [IU]/min via INTRAVENOUS
  Filled 2018-04-22: qty 2

## 2018-04-22 MED ORDER — PANTOPRAZOLE SODIUM 40 MG PO PACK
40.0000 mg | PACK | Freq: Every day | ORAL | Status: DC
  Administered 2018-04-22 – 2018-04-23 (×2): 40 mg
  Filled 2018-04-22 (×2): qty 20

## 2018-04-22 NOTE — Progress Notes (Signed)
OT Cancellation Note  Patient Details Name: Eric Murillo MRN: [id number] DOB: 1960-10-02   Cancelled Treatment:    Reason Eval/Treat Not Completed: Medical issues which prohibited therapy (pt with code, intubated and femoral line. Await medical clearance to proceed)  Ignacia Palma, OTR/L Acute Rehab Services Pager [phone] Office [phone]    04/22/2018, 7:38 AM

## 2018-04-22 NOTE — Progress Notes (Addendum)
Patient ID: Eric Murillo, male   DOB: [birth year], 57 y.o.   MRN: [id number]     Advanced Heart Failure Rounding Note  PCP-Cardiologist: Loralie Champagne, MD   Subjective:    Presented to [facility] ED after a syncopal episode. 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 Transferred to [facility] and had another episode of VT in ED that required ATP.   9/18 VT arrest. Amio drip restarted but now back to po. No further arrythmias.   9/28: PEA arrest, treated with CPR and epinephrine (no shocks) with ROSC.    Remains intubated 40% FiO2. NE @ 33 mcg/kg/min. Back on CVVHD, pulling about 50 mls/hr. MAPs ~60.  Tmax 100.7. Remains on Vanc/Cefepime. WBC 21.5 > 25.6. Blood cultures 9/28 NGTD.  Had some bleeding at femoral line site yesterday with hematoma. Heparin was held for 6 hours. Hemoglobin stable 13.2. Heparin restarted with n o problems.  Per RN, he is waking up and following commands.   RHC/LHC 04/16/2018  1. Low, but not markedly low, cardiac output.  2. Pulmonary venous hypertension.  3. Markedly elevated right and left heart filling pressures.   4. No significant coronary disease.   RHC Procedural Findings: Hemodynamics (mmHg) RA mean 18 RV 65/17 PA 69/30, mean 47 PCWP mean 36 LV 100/27 AO 97/65 Oxygen saturations: PA 52% AO 94% Cardiac Output (Fick) 4.98  Cardiac Index (Fick) 2.12 PVR 2.2 WU Cardiac Output (Thermo) 4.85 Cardiac Index (Thermo) 2.06  PVR 2.3 WU  Objective:   Weight Range: 108 kg Body mass index is 35.16 kg/m.   Vital Signs:   Temp:  [95.9 F (35.5 C)-100.7 F (38.2 C)] 97.9 F (36.6 C) (09/30 0731) Pulse Rate:  [58-94] 81 (09/30 0700) Resp:  [0-23] 20 (09/30 0700) BP: (80-105)/(46-73) 90/61 (09/30 0700) SpO2:  [97 %-100 %] 98 % (09/30 0700) Arterial Line BP: (85-139)/(42-68) 113/50 (09/30 0700) FiO2 (%):  [40 %] 40 % (09/30 0440) Weight:  [108 kg] 108 kg (09/30 0400) Last BM Date: 04/15/18  Weight change: Filed Weights   04/20/18 0549 04/21/18 0645 04/22/18 0400  Weight: 109.7 kg 109 kg 108 kg    Intake/Output:   Intake/Output Summary (Last 24 hours) at 04/22/2018 0754 Last data filed at 04/22/2018 0700 Gross per 24 hour  Intake 2186.08 ml  Output 3501 ml  Net -1314.92 ml      Physical Exam   General: Intubated/sedated.  HEENT: + ETT Neck: Supple. JVP difficult. Carotids 2+ bilat; no bruits. No thyromegaly or nodule noted. Cor: Nonpalpable PMI. RRR, No M/G/R noted Lungs: Diminished basilar sounds Abdomen: Soft, non-tender, non-distended, no HSM. No bruits or masses. +BS  Extremities: No cyanosis, clubbing, or rash. Trace ankle edema.  Neuro: Intubated/sedated   Telemetry   NSR 80s with 1st degree AV block and IVCD. Personally reviewed.   Labs    CBC Recent Labs    04/20/18 1115 04/21/18 0415  04/21/18 1115 04/22/18 0400  WBC 22.4* 17.0*  --  21.5* 25.6*  NEUTROABS 20.4* 13.0*  --   --   --   HGB 13.8 13.6   < > 13.0 13.2  HCT 44.1 43.0   < > 42.3 42.6  MCV 95.7 93.3  --  96.1 97.0  PLT 260 247  --  245 241   < > = values in this interval not displayed.   Basic Metabolic Panel Recent Labs    04/21/18 0415  04/21/18 1600 04/22/18 0400 04/22/18 0413  NA 134*   < > 132*  --  133*  K 3.2*   < > 4.4  --  5.2*  CL 94*   < > 98  --  100  CO2 28  --  23  --  22  GLUCOSE 132*   < > 123*  --  122*  BUN 38*   < > 27*  --  22*  CREATININE 4.82*   < > 3.55*  --  3.23*  CALCIUM 8.2*  --  8.1*  --  8.4*  MG 2.8*  --   --  2.5*  --   PHOS 4.1  --  3.4  --  3.4   < > = values in this interval not displayed.   Liver Function Tests Recent Labs    04/20/18 1207  04/21/18 0415 04/21/18 1600 04/22/18 0413  AST 165*  --  177*  --   --   ALT 235*  --  238*  --   --   ALKPHOS 206*  --  196*  --   --   BILITOT 2.2*  --  2.6*  --   --   PROT 7.0  --  7.0  --   --   ALBUMIN 2.7*   < > 2.7* 2.6* 2.6*   < > = values in this interval not displayed.   No results for input(s): LIPASE,  AMYLASE in the last 72 hours. Cardiac Enzymes No results for input(s): CKTOTAL, CKMB, CKMBINDEX, TROPONINI in the last 72 hours.  BNP: BNP (last 3 results) Recent Labs    07/26/17 0916 11/03/17 1238 11/13/17 0400  BNP 399.7* 1,160.1* 1,085.3*    ProBNP (last 3 results) No results for input(s): PROBNP in the last 8760 hours.   D-Dimer No results for input(s): DDIMER in the last 72 hours. Hemoglobin A1C No results for input(s): HGBA1C in the last 72 hours. Fasting Lipid Panel No results for input(s): CHOL, HDL, LDLCALC, TRIG, CHOLHDL, LDLDIRECT in the last 72 hours. Thyroid Function Tests No results for input(s): TSH, T4TOTAL, T3FREE, THYROIDAB in the last 72 hours.  Invalid input(s): FREET3  Other results:   Imaging    No results found.   Medications:     Scheduled Medications: . allopurinol  100 mg Oral BID  . amiodarone  200 mg Oral BID  . calcitRIOL  0.5 mcg Oral Q T,Th,Sa-HD  . chlorhexidine gluconate (MEDLINE KIT)  15 mL Mouth Rinse BID  . Chlorhexidine Gluconate Cloth  6 each Topical Q0600  . feeding supplement (NEPRO CARB STEADY)  237 mL Oral TID BM  . feeding supplement (PRO-STAT SUGAR FREE 64)  30 mL Oral BID  . fentaNYL (SUBLIMAZE) injection  50 mcg Intravenous Once  . mouth rinse  15 mL Mouth Rinse 10 times per day  . mexiletine  150 mg Oral Q12H  . midodrine  10 mg Oral TID WC  . multivitamin  1 tablet Oral QHS  . ranolazine  500 mg Oral BID  . sodium chloride flush  10-40 mL Intracatheter Q12H  . sodium chloride flush  3 mL Intravenous Q12H  . sodium chloride flush  3 mL Intravenous Q12H  . THROMBI-PAD  1 each Topical Once    Infusions: . sodium chloride    . sodium chloride    . sodium chloride    . ceFEPime (MAXIPIME) IV 2 g (04/21/18 2211)  . fentaNYL infusion INTRAVENOUS 100 mcg/hr (04/21/18 2311)  . heparin 800 Units/hr (04/21/18 1951)  . heparin    . norepinephrine (LEVOPHED) Adult infusion 35 mcg/min (  04/22/18 6606)  . dialysis  replacement fluid (prismasate) 500 mL/hr at 04/22/18 0432  . dialysis replacement fluid (prismasate) 300 mL/hr at 04/22/18 0028  . dialysate (PRISMASATE) 2,000 mL/hr at 04/22/18 0654  . vancomycin Stopped (04/21/18 1009)    PRN Medications: sodium chloride, sodium chloride, sodium chloride, acetaminophen, alteplase, bismuth subsalicylate, docusate, fentaNYL, heparin, heparin, HYDROcodone-acetaminophen, lidocaine (PF), lidocaine-prilocaine, midazolam, midazolam, ondansetron (ZOFRAN) IV, pentafluoroprop-tetrafluoroeth, promethazine, senna-docusate, sodium chloride flush, sodium chloride flush, sorbitol    Patient Profile   Eric Murillo is a 57 y.o. male with history of morbid obesity, hyperlipidemia, HTN, atrial flutter (s/p TEE DC-CV 04/03/14 and again in 8/17), NICM with chronic systolic HF and severe OSA but unable to tolerate CPAP.    Assessment/Plan   1. PEA arrest: 9/28.  Patient now intubated and back on CVVH/norepinephrine. ?Development of septic shock with rise in WBCs versus due to hyperkalemia with missed HD.  Tmax 100.7. WBC trending up 25.6. Blood cultures NGTD.  Prognosis at this point is very poor, patient is still full code.  Following commands per RN.  2. Recurrent VT requiring shock and ATP: Follows with Dr Caryl Comes. EP has been consulted. Last saw Dr Caryl Comes 04/03/18 and was having recurrent episodes of VF. Mexilitine was added in addition to his amio.  Coronary angiography this admission showed no CAD. Cardiac output was low but not markedly so.  Concern for VT as sign of end stage cardiomyopathy.   - No further VT.   - On po amiodarone and mexilitene. Ranexa on hold (unable to crush) 3. Chronic systolic heart failure: Nonischemic cardiomyopathy, likely related to HTN.  Echo in 8/14 with EF 45% but EF down to 25-30% on TEE while in atrial flutter (03/2014).  Echo (1/16) with EF ~25% and echo in 6/16 with EF 30-35%.  No cardiac MRI done with elevated creatinine and size. There was  concern for cardiac amyloidosis.  However, negative SPEP and abdominal fat pad biopsy negative. Low voltage on ECG may be due to obesity and not amyloidosis. Echo 10/2017: EF 20-25%, mild to mod MR. S/P [employer] ICD.  Marked volume overload by RHC this admission.  Unable get fluid off with HD initially due to hypotension and required CVVH.  Improved to the point where he could go back on iHD, but then had PEA arrest and now back on norepinephrine + CVVH.  - CVVH for volume management. Only able to pull ~50 mls/hr. - Continue midodrine 10 mg tid.  - NE up to 33 mcg/kg/min this am. MAPs ~60. - He has end stage HF, not candidate for LVAD and unlikely to be able to get to heart/kidney transplant.  4. Atypical Atrial Flutter: Recurrent.  S/p several DCCVs, most recently 11/21/17. Has been seen by EP. Not felt to be amenable to ablation.  - Maintaining NSR. - Restart heparin gtt, can transition to Eliquis again eventually if no bleeding.  5. ESRD: Now on HD T/R/Sat.  Intolerant iHD earlier this admission, hypotensive when trying to pull off excess fluid and had VT. He initially required norepinephrine and CVVH, was back on iHD but with PEA arrest 9/28, he is not back on norepinephrine and CVVH. No change. Only able to pull 50 mls/hr. 6. OSA: Does not tolerate CPAP. Intubated. 7. Morbid Obesity:   Body mass index is 35.16 kg/m.  8. ID:  ?component of septic shock as WBCs had been rising.  Will have to follow cultures and fever curve closely.  CXR w/o PNA.  - Continue vanc/cefepime  for now.  - WBC up to 25.6. Tmax 100.7. Blood cultures NGTD 04/20/18. 9. Pill dysphagia: Barium swallow 9/25 was normal.   Georgiana Shore NP 04/22/2018  7:54 AM  Advanced Heart Failure Team Pager [phone] (M-F; [place])  Please contact [facility] for night-coverage after hours (4p -7a ) and weekends on [email or web address]  Patient seen with NP, agree with the above note.   Tm 100.7 last night, WBCs remain elevated.  He is on  vancomycin/cefepime.  No positive cultures so far and CXR w/o PNA.   BP improved now, titrating down on norepinephrine (currently at 30).  Continues on midodrine.  Currently getting CVVH with UF 50-100 cc/hr.   - Continue to titrate down on norepinephrine as needed.  - Will need to get back to [facility] after extubation.   Weaning vent today per CCM.   Overall prognosis remains poor.    Loralie Champagne 04/22/2018 9:16 AM

## 2018-04-22 NOTE — Progress Notes (Signed)
Name: Eric Murillo MRN: [id number] DOB: Jun 08, 1961    ADMISSION DATE:  04/01/2018 CONSULTATION DATE: 12 20, 2019  REFERRING MD : Cardiology service CHIEF COMPLAINT: Post cardiac arrest due to PEA with bradycardia   HISTORY OF PRESENT ILLNESS:   Patient is a 57 year old African-American male with past medical history of end-stage renal disease hemodialysis noncompliant hypertension hyperlipidemia.  Patient is also known to have recurrent V. tach chronic systolic heart failure 20-25% nonischemic cardiomyopathy who was here since September 16 he was admitted this time with syncope.  9/16 Cath >> no significant CAD, acutely elevated right and left filling pressures  9/18 VT arrest. Amio drip restarted   9/28: PEA arrest, treated with CPR and epinephrine (no shocks) with ROSC.   ETT 9/28 >> [facility] HD cath 9/28 >> Rt fem CVL 9/28 >>  Blood 9/28>>>NTD Sputum 9/20>>>NTD  Vanc 9/28>>> Cefepime 9/28>>>  SUBJECTIVE:  No events overnight, no new complaints, levophed dose rising with volume out  VITAL SIGNS: Temp:  [95.9 F (35.5 C)-100.7 F (38.2 C)] 97.9 F (36.6 C) (09/30 0731) Pulse Rate:  [64-94] 80 (09/30 0800) Resp:  [0-23] 20 (09/30 0800) BP: (80-113)/(46-73) 95/62 (09/30 0800) SpO2:  [97 %-100 %] 99 % (09/30 0800) Arterial Line BP: (85-139)/(42-61) 125/58 (09/30 0800) FiO2 (%):  [40 %] 40 % (09/30 0800) Weight:  [409 kg] 108 kg (09/30 0400)  PHYSICAL EXAMINATION: General: Chronically ill appearing male, NAD, on vent HEENT: [place]/AT, PERRL, EOM-I and MMM with lacerations on the inside of the lips and tongue consistent with biting Lungs: Coarse BS diffusely Heart: RRR, Nl S1/S2 and -M/R/G Abdomen: Soft, NT, ND and +BS Neuro: Arousable and following commands but slow to respond Skin: intact other than above  Recent Labs  Lab 04/21/18 0415 04/21/18 1111 04/21/18 1600 04/22/18 0413  NA 134* 134* 132* 133*  K 3.2* 4.1 4.4 5.2*  CL 94* 97* 98 100  CO2 28  --  23 22    BUN 38* 32* 27* 22*  CREATININE 4.82* 3.90* 3.55* 3.23*  GLUCOSE 132* 160* 123* 122*   Recent Labs  Lab 04/21/18 0415 04/21/18 1111 04/21/18 1115 04/22/18 0400  HGB 13.6 16.0 13.0 13.2  HCT 43.0 47.0 42.3 42.6  WBC 17.0*  --  21.5* 25.6*  PLT 247  --  245 241   Dg Abd 1 View  Result Date: 04/20/2018 CLINICAL DATA:  Feeding tube placement EXAM: ABDOMEN - 1 VIEW COMPARISON:  None. FINDINGS: There is a nasogastric tube projecting over the stomach. There is oral contrast material within the transverse and descending colon. There is no bowel dilatation to suggest obstruction. There is no evidence of pneumoperitoneum, portal venous gas or pneumatosis. There are no pathologic calcifications along the expected course of the ureters. The osseous structures are unremarkable. IMPRESSION: Nasogastric tube with the tip projecting over the stomach. Electronically Signed   By: Elige Ko   On: 04/20/2018 11:29   Dg Chest Port 1 View  Result Date: 04/21/2018 CLINICAL DATA:  Endotracheally intubated EXAM: PORTABLE CHEST 1 VIEW COMPARISON:  Endotracheal tube, esophageal tube, central venous line unchanged. FINDINGS: Endotracheal tube and central line unchanged. Normal cardiac silhouette. Low lung volumes. No edema, infiltrate, pneumothorax. IMPRESSION: 1. Stable support apparatus. 2.  No acute cardiopulmonary process. Electronically Signed   By: Genevive Bi M.D.   On: 04/21/2018 07:35   Dg Chest Port 1 View  Result Date: 04/20/2018 CLINICAL DATA:  Endotracheal tube repositioning EXAM: PORTABLE CHEST 1 VIEW COMPARISON:  April 20, 2018 study obtained earlier in the day FINDINGS: Endotracheal tube tip is now 2.9 cm above the carina. Nasogastric tube tip and side port are below the diaphragm. Central catheter tip is at the junction of the left innominate vein and superior vena cava. No pneumothorax. Lungs are clear. Heart is slightly enlarged with pulmonary vascularity normal. Pacemaker lead is  attached to the right ventricle. No adenopathy. No bone lesions. IMPRESSION: Tube and catheter positions as described without evident pneumothorax. Lungs clear. Stable cardiac prominence. Electronically Signed   By: Bretta Bang III M.D.   On: 04/20/2018 13:56   Dg Chest Port 1 View  Result Date: 04/20/2018 CLINICAL DATA:  Respiratory difficulty EXAM: PORTABLE CHEST 1 VIEW COMPARISON:  04/18/2018 FINDINGS: Endotracheal tube placed. Tip is 5.6 cm from the carina. NG tube placed. The tip is in the fundus of the stomach. Left subclavian AICD device is stable. Left jugular dialysis catheter unchanged. Mild cardiomegaly. Clear lungs. No pneumothorax. IMPRESSION: Endotracheal and NG tubes placed as described. Clear lungs. Electronically Signed   By: Jolaine Click M.D.   On: 04/20/2018 11:05   I reviewed CXR myself, ETT is in a good position from 9/29  ASSESSMENT / PLAN:  57 year old with significant non-compliance history and multiple episodes of VF who presents to PCCM after a cardiac arrest followed by cards and EP.  Discussed with RT and bedside RN  Acute respiratory failure post cardiac arrest- Decrease sedation to begin PS trials No extubation today at current mental status Adjust vent for ABG Titrate O2 for sat of 88-92% CXR in AM OSA-  has not tolerated CPAP in the past- may need to extubate to BiPAP when ready  Status post cardiac arrest PEA arrest with bradycardia most likely due to hyperkalemia  With missed dialysis. He remains on oral amiodarone, mexiletine and Ranexa for recurrent VT, has been seen by EP  Cardiogenic shock: Levophed for BP support KVO IVF  ESRD: CRRT while on pressors, negative 50-100 ml/hr if BP permits Replace electrolytes as indicated KVO IVF  Chronic systolic heart failure -being managed by cardiology, not a candidate for LVAD per cards  Anoxic injury: patient is following commands this AM Decrease fentanyl and evaluate neuro status more formally May  need MRI if there are significant changes  Recurrent atrial flutter Heparin drip  HCAP: CXR is clear so doubtful, cultures are negative Check PCT now (acknowledge patient on CRRT but will trend) If PCT is low and no fever overnight will consider d/c in AM  The patient is critically ill with multiple organ systems failure and requires high complexity decision making for assessment and support, frequent evaluation and titration of therapies, application of advanced monitoring technologies and extensive interpretation of multiple databases.   Critical Care Time devoted to patient care services described in this note is  33  Minutes. This time reflects time of care of this signee Dr Koren Bound. This critical care time does not reflect procedure time, or teaching time or supervisory time of PA/NP/Med student/Med Resident etc but could involve care discussion time.  Alyson Reedy, M.D. [facility] Pulmonary/Critical Care Medicine. Pager: [phone]. After hours pager: [phone].  04/22/2018, 8:39 AM

## 2018-04-22 NOTE — Progress Notes (Signed)
ANTICOAGULATION CONSULT NOTE - Follow Up Consult  Pharmacy Consult for heparin (apixaban on hold) Indication: atrial fibrillation/flutter  Allergies  Allergen Reactions  . Nsaids Other (See Comments)    Cannot take to due kidney issues  . Beta Adrenergic Blockers Other (See Comments)    An unnamed, white-colored Beta Blocker made him "feel funny" = made him feel "cagey"    Patient Measurements: Height: 5\' 9"  (175.3 cm) Weight: 238 lb 1.6 oz (108 kg) IBW/kg (Calculated) : 70.7  Vital Signs: Temp: 97.9 F (36.6 C) (09/30 0731) Temp Source: Oral (09/30 0731) BP: 135/55 (09/30 1507) Pulse Rate: 76 (09/30 1507)  Labs: Recent Labs    04/20/18 2331 04/21/18 0415 04/21/18 1111 04/21/18 1115 04/21/18 1600 04/22/18 0400 04/22/18 0413 04/22/18 0650 04/22/18 1233  HGB  --  13.6 16.0 13.0  --  13.2  --   --   --   HCT  --  43.0 47.0 42.3  --  42.6  --   --   --   PLT  --  247  --  245  --  241  --   --   --   APTT 127*  --   --   --   --  62*  --   --  95*  LABPROT  --  27.2*  --   --   --   --   --  22.5*  --   INR  --  2.54  --   --   --   --   --  2.00  --   HEPARINUNFRC  --   --   --   --   --   --   --  >2.20* >2.20*  CREATININE  --  4.82* 3.90*  --  3.55*  --  3.23*  --   --     Estimated Creatinine Clearance: 30.6 mL/min (A) (by C-G formula based on SCr of 3.23 mg/dL (H)).   Medical History: Past Medical History:  Diagnosis Date  . AICD (automatic cardioverter/defibrillator) present   . Anemia of chronic disease   . Arthritis   . Atrial flutter (HCC)    DCCV 9/15  . Chest pain on exertion   . Chronic systolic congestive heart failure, NYHA class 2 (HCC)    EF 20-25% 1/16  . CKD (chronic kidney disease) stage 3, GFR 30-59 ml/min (HCC)   . Dyslipidemia   . Gout    Of big toe  . Hyperglycemia   . Hyperlipidemia   . Hypertension   . Morbid obesity with BMI of 45.0-49.9, adult (HCC)   . Non-ischemic cardiomyopathy (HCC)   . Organic erectile dysfunction   .  OSA (obstructive sleep apnea)   . Pilonidal cyst   . Submandibular sialolithiasis    Right   Assessment: 57 year old male s/p code blue this weekend  Patient with history aflutter/cardioversion recently. Has been taking apixaban but given need for fistulagram this was stopped 9/27 and plan was to hold for 48 hours. Plans for fistulagram on hold indefinitely. Heparin to continue for now. CBC stable overnight, INR elevated at 2.0 in setting of  apixaban last dose 9/27.  Will monitor heparin based on aptt. Heparin drip 900 uts/hr aptt 95sec at goal.     Goal of Therapy:  Heparin level 0.3-0.7 units/ml aPTT 66-102 seconds Monitor platelets by anticoagulation protocol: Yes   Plan:  Continue heparin infusion at 900 units/hr Check aptt daily while on heparin Continue to monitor  H&H and platelets   Leota Sauers Pharm.D. CPP, BCPS Clinical Pharmacist [phone] 04/22/2018 3:36 PM

## 2018-04-22 NOTE — Progress Notes (Signed)
Daily Progress Note   Patient Name: Eric Murillo       Date: 04/22/2018 DOB: 1960-07-31  Age: 57 y.o. MRN#: [id number] Attending Physician: Larey Dresser, MD Primary Care Physician: Carroll Sage, MD Admit Date: 04/19/2018  Reason for Consultation/Follow-up: Establishing goals of care  Subjective: Dr. Jonnie Finner arranged a meeting with the family and invited me to join.  I spoke with the bedside RNs, examined the patient and when to meet with the family and Dr. Jonnie Finner.  Thayer Headings (Media planner), Langley Gauss (S/O), Mama, Thereasa Solo (niece who worked in HD), Manus Gunning (Sister) and D (BIL) were present.  Myrna Blazer (brother) was on the phone.  We discussed the patient's very weak heart, failed kidneys and respiratory issues.  The family mentioned that it was all in God's hands and that they would accept what ever fate God has for Eric Murillo.  When the question of code status was asked the family deferred to [facility].  She expressed that she did not want Eric Murillo to suffer and if resuscitation was not going to improve his situation then she was in agreement not to do it.  Hinda Kehr and the family appeared to [place].  Myrna Blazer who was on the phone expressed that he felt strongly that we should continue to do everything possible medically for [facility].  I explained that changing his code status does not mean that we will not continue to treat him.  But Myrna Blazer felt that we should attempt resuscitation - Afterward, the family joined in all of them, including Mama supporting the idea of resuscitation.   "When God calls him home you will not be able to resuscitate him".  The family concluded with multiple family members quoting scripture and requesting that the medical team do everything possible for him for as long as we can.      Assessment: Patient with ESRD, Severe OSA, pulmonary HTN, Advanced heart failure with recurrent Vtach, who has arrested twice - (1) with 6 minutes to ROSC and the (2) time with 35 minutes to ROSC.  He his currently intubated and on pressors.  The medical concern is that he is dialysis dependent and is now unable to tolerate hemo dialysis.   Patient Profile/HPI:  57 y.o. male  with past medical history of ESRD on HD for about 6 months, anemia of chronic  disease, AICD, A. Flutter, Chronic CHF EF 20-25% Jan 2016, high blood pressure and cholesterol, Non-ischemic cardiomyopathy, Obesity, OSA admitted on 03/30/2018 with V. Tach, CHF, ESRD.  In the ER the patient had an episode of Vtach requiring ATP, subsequently he had an arrest on 9/18 requiring CPR and an arrest on 9/28.   Length of Stay: 18  Current Medications: Scheduled Meds:  . allopurinol  100 mg Oral BID  . amiodarone  200 mg Oral BID  . calcitRIOL  0.5 mcg Oral Q T,Th,Sa-HD  . chlorhexidine gluconate (MEDLINE KIT)  15 mL Mouth Rinse BID  . Chlorhexidine Gluconate Cloth  6 each Topical Q0600  . feeding supplement (NEPRO CARB STEADY)  237 mL Oral TID BM  . feeding supplement (PRO-STAT SUGAR FREE 64)  30 mL Oral BID  . fentaNYL (SUBLIMAZE) injection  50 mcg Intravenous Once  . mouth rinse  15 mL Mouth Rinse 10 times per day  . mexiletine  150 mg Oral Q12H  . midodrine  10 mg Oral TID WC  . multivitamin  1 tablet Oral QHS  . pantoprazole sodium  40 mg Per Tube Daily  . sodium chloride flush  10-40 mL Intracatheter Q12H  . sodium chloride flush  3 mL Intravenous Q12H  . sodium chloride flush  3 mL Intravenous Q12H  . THROMBI-PAD  1 each Topical Once    Continuous Infusions: . sodium chloride    . sodium chloride    . sodium chloride    . ceFEPime (MAXIPIME) IV Stopped (04/22/18 0950)  . fentaNYL infusion INTRAVENOUS 50 mcg/hr (04/22/18 1500)  . heparin 900 Units/hr (04/22/18 1500)  . heparin    . norepinephrine (LEVOPHED)  Adult infusion 33 mcg/min (04/22/18 1518)  . dialysis replacement fluid (prismasate) 500 mL/hr at 04/22/18 0432  . dialysis replacement fluid (prismasate) 300 mL/hr at 04/22/18 1439  . dialysate (PRISMASATE) 2,000 mL/hr at 04/22/18 1435  . vancomycin Stopped (04/22/18 1204)  . vasopressin (PITRESSIN) infusion - *FOR SHOCK*      PRN Meds: sodium chloride, sodium chloride, sodium chloride, acetaminophen, alteplase, bismuth subsalicylate, docusate, fentaNYL, heparin, heparin, HYDROcodone-acetaminophen, lidocaine (PF), lidocaine-prilocaine, midazolam, midazolam, ondansetron (ZOFRAN) IV, pentafluoroprop-tetrafluoroeth, promethazine, senna-docusate, sodium chloride flush, sodium chloride flush, sorbitol  Physical Exam        Chronically ill appearing man, awake, intubated He attempts to respond to my questions CV no frank m/r/g Resp no distress Abdomen soft, nt Ext 1-2+ edema, darkening of lower ext with excoriation on RLE  Vital Signs: BP (!) 135/55   Pulse 76   Temp 97.9 F (36.6 C) (Oral)   Resp 20   Ht '5\' 9"'$  (1.753 m)   Wt 108 kg   SpO2 99%   BMI 35.16 kg/m  SpO2: SpO2: 99 % O2 Device: O2 Device: Ventilator O2 Flow Rate: O2 Flow Rate (L/min): 4 L/min  Intake/output summary:   Intake/Output Summary (Last 24 hours) at 04/22/2018 1534 Last data filed at 04/22/2018 1500 Gross per 24 hour  Intake 1927.98 ml  Output 2793 ml  Net -865.02 ml   LBM: Last BM Date: 04/15/18 Baseline Weight: Weight: (!) 170.1 kg Most recent weight: Weight: 108 kg       Palliative Assessment/Data: 20%    Patient Active Problem List   Diagnosis Date Noted  . Malnutrition of moderate degree 04/21/2018  . Cardiogenic shock ([place])   . Acidosis   . Encephalopathy   . Goals of care, counseling/discussion   . Palliative care by specialist   . V-tach (  [place]) 04/18/2018  . Syncope, cardiogenic 04/06/2018  . Endotracheally intubated   . Atrial fibrillation ([place]) 11/13/2017  . ESRD (end stage renal  disease) ([place]) 11/13/2017  . 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 Long term current use of anticoagulant therapy 04/07/2015  . Health care maintenance 03/10/2015  . Osteoarthritis 06/30/2014  . Anemia of chronic disease 04/21/2014  . Gout 04/21/2014  . Atrial flutter ([place]) 03/24/2014  . Obstructive sleep apnea 12/11/2011  . Cardiomyopathy, nonischemic ([place]) 09/30/2010  . Chronic systolic heart failure ([place]) 09/15/2010  . History of cardiac arrest 04/19/2009  . Hyperlipidemia 07/04/2006  . Morbid obesity with BMI of 45.0-49.9, adult ([place]) 07/04/2006  . Hypertensive cardiovascular disease 05/30/2006    Palliative Care Plan    Recommendations/Plan:  Family will not make the decision for DNR or comfort care.  They will not "play God".  They also do not want him to suffer.  Family with low health literacy.  They rely heavily on religion.    Will engage chaplain for family and patient support.  PMT will continue to be engaged in a supportive fashion.  Goals of Care and Additional Recommendations:  Limitations on Scope of Treatment: Full Scope Treatment  Code Status:  Full code  Prognosis:   < 4 weeks Mr. Newsham will most likely be unable to tolerate intermittent hemodialysis due to recurrent Vtach.  Further he is at high risk for an acute life ending event.   Discharge Planning:  To Be Determined  Care plan was discussed with Dr. Jonnie Finner, bedside RNs, family.  Thank you for allowing the Palliative Medicine Team to assist in the care of this patient.  Total time spent:  60 min.     Greater than 50%  of this time was spent counseling and coordinating care related to the above assessment and plan.  Florentina Jenny, PA-C Palliative Medicine  Please contact Palliative MedicineTeam phone at [phone] for questions and concerns between 7 am - 7 pm.   Please see AMION for individual provider pager numbers.

## 2018-04-22 NOTE — Progress Notes (Signed)
PT Cancellation Note  Patient Details Name: Eric Murillo MRN: [id number] DOB: June 04, 1961   Cancelled Treatment:    Reason Eval/Treat Not Completed: Medical issues which prohibited therapy(pt with code, intubated and femoral line. Await medical clearance to proceed)   Sharone Picchi B Madylyn Insco 04/22/2018, 7:02 AM  Delaney Meigs, PT Acute Rehabilitation Services Pager: [phone] Office: [phone]

## 2018-04-22 NOTE — Progress Notes (Signed)
[facility] NEPHROLOGY PROGRESS NOTE  Assessment/ Plan: Pt is a 57 y.o. yo male ESRD on HD TTS, with syncopal episode, recurrent VT requiring shock.  HD: [facility]/ TTS 4.5h  122.5 kg  2/2 bath Hep none  RUA AVF - calc 0.5 ug tiw - venofer 7/10 completed 100 mg qhd  Impression/ Plan 1) Recurrent VT/ VT storm: chronic systolic heart failure with EF of 25 to 30%, hx of nonischemic cardiomyopathy/ indwelling ICD.  Currently on amiodarone, mexiletine. Cardiology following.   2)  PEA arrest: on 9/28 rx'd CPR and epinephrine w/ ROSC (no shocks).  Remains on vent in ICU.  Also had VT in ED on admission 9/13 that required ATP and had VT arrest 9/18 w/ brief CPR.    3) ESRD: HD TTS.  Was on CRRT from 9/18-9/23, then had another cardiac arrest on Sat 9/28.  Not a candidate for outpatient/long-term dialysis w/ cardiac instability.  Will meet w/ pall care and family (and pt if awakened) to discuss further.   4) Hyperkalemia: Improved after dialysis.  5) Anemia: Hemoglobin acceptable.   6) Secondary hyperparathyroidism: Discontinue PhosLo since patient is on CRRT.  Monitor calcium and phosphorus.    7) Shock/ hypotension: levophed IV, also on midodrine 15 mg 3 times daily.     Kelly Splinter MD [name] pgr [phone]   04/22/2018, 1:04 PM    Subjective:  Seen and examined in ICU.  Patient is intubated, sedated and on pressors.  On CRRT with systolic blood pressure around 100.  Physical Exam: General: Lying in bed, intubated, sedated, ETT in place, on CRRT Heart: Regular rate rhythm, S1-S2 normal Lungs: Coarse breath sound bilateral  abdomen: Soft, nontender, nondistended Extremities: 1-2+ edema. Dialysis Access: Right upper extremity aVF faint bruit with no radiation.  Has temporary catheter.  Objective Vital signs in last 24 hours: Vitals:   04/22/18 0800 04/22/18 0900 04/22/18 1000 04/22/18 1111  BP: 95/62 96/65 (!) 84/56 (!) 117/53   Pulse: 80 79  75  Resp: '20 20 20 19  '$ Temp:      TempSrc:      SpO2: 99% 99%  99%  Weight:      Height:        Labs: Basic Metabolic Panel: Recent Labs  Lab 04/21/18 0415 04/21/18 1111 04/21/18 1600 04/22/18 0413  NA 134* 134* 132* 133*  K 3.2* 4.1 4.4 5.2*  CL 94* 97* 98 100  CO2 28  --  23 22  GLUCOSE 132* 160* 123* 122*  BUN 38* 32* 27* 22*  CREATININE 4.82* 3.90* 3.55* 3.23*  CALCIUM 8.2*  --  8.1* 8.4*  PHOS 4.1  --  3.4 3.4   Liver Function Tests: Recent Labs  Lab 04/20/18 1207  04/21/18 0415 04/21/18 1600 04/22/18 0413  AST 165*  --  177*  --   --   ALT 235*  --  238*  --   --   ALKPHOS 206*  --  196*  --   --   BILITOT 2.2*  --  2.6*  --   --   PROT 7.0  --  7.0  --   --   ALBUMIN 2.7*   < > 2.7* 2.6* 2.6*   < > = values in this interval not displayed.   No results for input(s): LIPASE, AMYLASE in the last 168 hours. No results for input(s): AMMONIA in the last 168 hours. CBC: Recent Labs  Lab 04/20/18 0725  04/20/18 1115 04/21/18  0141 04/21/18 1111 04/21/18 1115 04/22/18 0400  WBC 17.7*  --  22.4* 17.0*  --  21.5* 25.6*  NEUTROABS 13.3*  --  20.4* 13.0*  --   --   --   HGB 14.5   < > 13.8 13.6 16.0 13.0 13.2  HCT 46.9   < > 44.1 43.0 47.0 42.3 42.6  MCV 95.3  --  95.7 93.3  --  96.1 97.0  PLT 287  --  260 247  --  245 241   < > = values in this interval not displayed.   Cardiac Enzymes: No results for input(s): CKTOTAL, CKMB, CKMBINDEX, TROPONINI in the last 168 hours. CBG: Recent Labs  Lab 04/20/18 1611  GLUCAP 121*   Medications: Infusions: . sodium chloride    . sodium chloride    . sodium chloride    . ceFEPime (MAXIPIME) IV Stopped (04/22/18 0950)  . fentaNYL infusion INTRAVENOUS 50 mcg/hr (04/22/18 1100)  . heparin 900 Units/hr (04/22/18 1100)  . heparin    . norepinephrine (LEVOPHED) Adult infusion 30 mcg/min (04/22/18 1100)  . dialysis replacement fluid (prismasate) 500 mL/hr at 04/22/18 0432  . dialysis replacement  fluid (prismasate) 300 mL/hr at 04/22/18 0028  . dialysate (PRISMASATE) 2,000 mL/hr at 04/22/18 1016  . vancomycin 1,000 mg (04/22/18 1104)  . vasopressin (PITRESSIN) infusion - *FOR SHOCK*      Scheduled Medications: . allopurinol  100 mg Oral BID  . amiodarone  200 mg Oral BID  . calcitRIOL  0.5 mcg Oral Q T,Th,Sa-HD  . chlorhexidine gluconate (MEDLINE KIT)  15 mL Mouth Rinse BID  . Chlorhexidine Gluconate Cloth  6 each Topical Q0600  . feeding supplement (NEPRO CARB STEADY)  237 mL Oral TID BM  . feeding supplement (PRO-STAT SUGAR FREE 64)  30 mL Oral BID  . fentaNYL (SUBLIMAZE) injection  50 mcg Intravenous Once  . mouth rinse  15 mL Mouth Rinse 10 times per day  . mexiletine  150 mg Oral Q12H  . midodrine  10 mg Oral TID WC  . multivitamin  1 tablet Oral QHS  . pantoprazole sodium  40 mg Per Tube Daily  . sodium chloride flush  10-40 mL Intracatheter Q12H  . sodium chloride flush  3 mL Intravenous Q12H  . sodium chloride flush  3 mL Intravenous Q12H  . THROMBI-PAD  1 each Topical Once    have reviewed scheduled and prn medications.

## 2018-04-22 NOTE — Plan of Care (Signed)
  Problem: Role Relationship: Goal: Method of communication will improve Outcome: Progressing   Problem: Activity: Goal: Ability to tolerate increased activity will improve Outcome: Not Progressing

## 2018-04-22 NOTE — Progress Notes (Signed)
ANTICOAGULATION CONSULT NOTE   Pharmacy Consult for Heparin (apixaban on hold) Indication: atrial fibrillation/flutter   Allergies  Allergen Reactions  . Nsaids Other (See Comments)    Cannot take to due kidney issues  . Beta Adrenergic Blockers Other (See Comments)    An unnamed, white-colored Beta Blocker made him "feel funny" = made him feel "cagey"    Patient Measurements: Height: 5\' 9"  (175.3 cm) Weight: 238 lb 1.6 oz (108 kg) IBW/kg (Calculated) : 70.7  Vital Signs: Temp: 100.7 F (38.2 C) (09/30 0400) Temp Source: Axillary (09/30 0400) BP: 86/57 (09/30 0500) Pulse Rate: 83 (09/30 0500)  Labs: Recent Labs    04/19/18 0640  04/20/18 1207  04/20/18 2331 04/21/18 0415 04/21/18 1111 04/21/18 1115 04/21/18 1600 04/22/18 0400 04/22/18 0413  HGB  --    < >  --   --   --  13.6 16.0 13.0  --  13.2  --   HCT  --    < >  --   --   --  43.0 47.0 42.3  --  42.6  --   PLT  --    < >  --   --   --  247  --  245  --  241  --   APTT  --    < > 33  --  127*  --   --   --   --  62*  --   LABPROT 23.1*  --   --   --   --  27.2*  --   --   --   --   --   INR 2.07  --   --   --   --  2.54  --   --   --   --   --   CREATININE  --    < > 10.22*   < >  --  4.82* 3.90*  --  3.55*  --  3.23*   < > = values in this interval not displayed.    Estimated Creatinine Clearance: 30.6 mL/min (A) (by C-G formula based on SCr of 3.23 mg/dL (H)).   Medical History: Past Medical History:  Diagnosis Date  . AICD (automatic cardioverter/defibrillator) present   . Anemia of chronic disease   . Arthritis   . Atrial flutter (HCC)    DCCV 9/15  . Chest pain on exertion   . Chronic systolic congestive heart failure, NYHA class 2 (HCC)    EF 20-25% 1/16  . CKD (chronic kidney disease) stage 3, GFR 30-59 ml/min (HCC)   . Dyslipidemia   . Gout    Of big toe  . Hyperglycemia   . Hyperlipidemia   . Hypertension   . Morbid obesity with BMI of 45.0-49.9, adult (HCC)   . Non-ischemic  cardiomyopathy (HCC)   . Organic erectile dysfunction   . OSA (obstructive sleep apnea)   . Pilonidal cyst   . Submandibular sialolithiasis    Right   Assessment: 57 year old male s/p code blue this morning after refusing to go to HD.   Patient with history aflutter/cardioversion recently. Has been taking apixaban but given need for fistulagram this was stopped yesterday and we were planning to hold for 48 hours. I would imagine that plan is currently on hold, d/w Dr.Bensimhon and will start IV heparin for now. Will monitor based on aptt.  9/30 AM update: aPTT is low this AM, some groin bleeding on 9/29, no bleeding overnight per  RN  Goal of Therapy:  Heparin level 0.3-0.7 units/ml aPTT 66-102 seconds Monitor platelets by anticoagulation protocol: Yes   Plan:  Inc heparin to 900 units/hr 1200 aPTT/HL  Abran Duke, PharmD, BCPS Clinical Pharmacist Phone: [phone]

## 2018-04-23 ENCOUNTER — Inpatient Hospital Stay (HOSPITAL_COMMUNITY): Payer: Medicaid Other

## 2018-04-23 DIAGNOSIS — I469 Cardiac arrest, cause unspecified: Secondary | ICD-10-CM

## 2018-04-23 DIAGNOSIS — Z7189 Other specified counseling: Secondary | ICD-10-CM

## 2018-04-23 DIAGNOSIS — Z515 Encounter for palliative care: Secondary | ICD-10-CM

## 2018-04-23 LAB — CBC
HEMATOCRIT: 44.1 % (ref 39.0–52.0)
HEMOGLOBIN: 13.1 g/dL (ref 13.0–17.0)
MCH: 29.8 pg (ref 26.0–34.0)
MCHC: 29.7 g/dL — ABNORMAL LOW (ref 30.0–36.0)
MCV: 100.5 fL — ABNORMAL HIGH (ref 78.0–100.0)
Platelets: 241 10*3/uL (ref 150–400)
RBC: 4.39 MIL/uL (ref 4.22–5.81)
RDW: 17.5 % — ABNORMAL HIGH (ref 11.5–15.5)
WBC: 36.4 10*3/uL — ABNORMAL HIGH (ref 4.0–10.5)

## 2018-04-23 LAB — APTT
APTT: 184 s — AB (ref 24–36)
aPTT: 128 seconds — ABNORMAL HIGH (ref 24–36)

## 2018-04-23 LAB — RENAL FUNCTION PANEL
ALBUMIN: 2.5 g/dL — AB (ref 3.5–5.0)
ANION GAP: 12 (ref 5–15)
BUN: 21 mg/dL — ABNORMAL HIGH (ref 6–20)
CALCIUM: 8.5 mg/dL — AB (ref 8.9–10.3)
CO2: 19 mmol/L — ABNORMAL LOW (ref 22–32)
Chloride: 101 mmol/L (ref 98–111)
Creatinine, Ser: 2.58 mg/dL — ABNORMAL HIGH (ref 0.61–1.24)
GFR, EST AFRICAN AMERICAN: 30 mL/min — AB (ref >60)
GFR, EST NON AFRICAN AMERICAN: 26 mL/min — AB (ref >60)
GLUCOSE: 119 mg/dL — AB (ref 70–99)
PHOSPHORUS: 4.7 mg/dL — AB (ref 2.5–4.6)
Potassium: 5.1 mmol/L (ref 3.5–5.1)
SODIUM: 132 mmol/L — AB (ref 135–145)

## 2018-04-23 LAB — HEPARIN LEVEL (UNFRACTIONATED)
Heparin Unfractionated: 2.04 IU/mL — ABNORMAL HIGH (ref 0.30–0.70)
Heparin Unfractionated: 1.84 IU/mL — ABNORMAL HIGH (ref 0.30–0.70)

## 2018-04-23 LAB — MAGNESIUM: Magnesium: 2.7 mg/dL — ABNORMAL HIGH (ref 1.7–2.4)

## 2018-04-23 MED FILL — Medication: Qty: 1 | Status: AC

## 2018-04-23 DEATH — deceased

## 2018-04-24 LAB — BLOOD GAS, ARTERIAL
Acid-base deficit: 4.9 mmol/L — ABNORMAL HIGH (ref 0.0–2.0)
Bicarbonate: 20.4 mmol/L (ref 20.0–28.0)
Drawn by: 40418
FIO2: 40
O2 Saturation: 98.3 %
PATIENT TEMPERATURE: 94.5
PEEP/CPAP: 5 cmH2O
PO2 ART: 111 mmHg — AB (ref 83.0–108.0)
Pressure control: 15 cmH2O
RATE: 20 resp/min
pCO2 arterial: 38.4 mmHg (ref 32.0–48.0)
pH, Arterial: 7.331 — ABNORMAL LOW (ref 7.350–7.450)

## 2018-04-25 ENCOUNTER — Telehealth: Payer: Self-pay | Admitting: Cardiology

## 2018-04-25 LAB — CULTURE, BLOOD (SINGLE)
Culture: NO GROWTH
Culture: NO GROWTH
Special Requests: ADEQUATE
Special Requests: ADEQUATE

## 2018-04-25 NOTE — Telephone Encounter (Signed)
Original d/c received from [facility]. Sent interoffice to [facility] to Dr.McLean attention.

## 2018-04-26 ENCOUNTER — Encounter (HOSPITAL_COMMUNITY): Payer: Medicaid Other | Admitting: Cardiology

## 2018-04-30 ENCOUNTER — Encounter (HOSPITAL_COMMUNITY): Payer: Self-pay | Admitting: *Deleted

## 2018-04-30 NOTE — Progress Notes (Signed)
Received Death Certificate, from completed and signed by Dr Shirlee Latch, [facility] is aware to p/u

## 2018-05-03 ENCOUNTER — Encounter: Payer: Medicaid Other | Admitting: Internal Medicine

## 2018-05-24 NOTE — Death Summary Note (Signed)
  Advanced Heart Failure Death Summary  Death Summary   Patient ID: Eric Murillo MRN: [id number], DOB/AGE: 1960-10-22 57 y.o. Admit date: 03/27/2018 D/C date:     May 23, 2018   Primary Discharge Diagnoses:  1. PEA arrest - 9/28, 10/1 2. Recurrent VT - VT arrest 9/18 3. Chronic systolic HF 4. Atypical atrial flutter 5. ESRD on HD 6. OSA 7. Morbid obesity 8. ID -> septic shock 9. Pill dysphagia  Hospital Course:  Eric Murillo a 57 y.o.malewith history of morbid obesity, hyperlipidemia, HTN, atrial flutter (s/p TEE DC-CV 04/03/14 and again in 8/17), NICM with chronic systolic HF and severe OSA.    He was admitted 04/11/2018 with recurrent VT requiring shock by ICD. Anti-arrhymics were adjusted. LHC 04/03/2018 showed no CAD. VT was thought to be secondary to end stage cardiomyopathy. He had a VT arrest on 9/18 while on iHD with brief CPR. Amio drip restarted and transitioned to PO after appropriate load.  Due to severity of illness, Palliative Care was consulted on 9/18. He was able to tolerate CVVHD for 5 days and was able to start iHD for 2 sessions but refused iHD session on 9/28. Later that day he had a PEA arrest.  He was treated with CPR for 18 minutes, 5 amps of epinephrine, 2 mg ativan for seizure and got ROSC. He was intubated and started on pressors. On arrival to CCU, he had another asystolic arrest. He was given epi and bicarb and CPR was resumed. Achieved ROSC after several minutes. Blood cultures were drawn and he was started on broad spectrum antibiotics. CVVHD was restarted.  WBC continued to rise despite antibiotics. He was placed on pressors and due to ongoing hypotension these were increased and maxed out. This limited the ability to remove fluid with CVVHD. Palliative had a family meeting on 04/22/18 and the family ultimately decided to continue aggressive medical therapy and keep him a full code.  On the morning of 10/1, Dr Nelda Marseille and Dr Aundra Dubin met with pt's family and  told them that he is not a long term dialysis candidate due to ongoing evidence of multi system organ failure. He remained full code after this discussion.  Around 1 pm on 2018/05/23, pt PEA arrested. Code blue was called and he had CPR with ACLS protocol for 3 minutes. Family was present and Dr Nelda Marseille had a discussion with them and they decided to stop resuscitation and make him a full DNR. Family stayed with him as he passed. Time of death was 1:10 pm.  Duration of Discharge Encounter: Greater than 35 minutes   Signed, Georgiana Shore, NP 05/23/18, 2:03 PM

## 2018-05-24 NOTE — Progress Notes (Signed)
Releived chaplain Lucretia Roers to continue support to family at bedside. Patient was actively dying.  Remain with family until patient passed. Provided empathetic listening, emotional and spiritual support. Venida Jarvis, [place], [facility], Pager [phone]

## 2018-05-24 NOTE — Progress Notes (Signed)
Pt blood pressure decreasing throughout the morning. Family at bedside. Pt PEA arrested. Code initiated CPR started. Pt received 2 epi and family asked team to stop and "let him rest."

## 2018-05-24 NOTE — Procedures (Signed)
Code Blue Note  PEA arrest, CPR stopped at the family's request after a long discussion.  Please see code sheet.  Alyson Reedy, M.D. [facility] Pulmonary/Critical Care Medicine. Pager: [phone]. After hours pager: [phone].

## 2018-05-24 NOTE — Progress Notes (Signed)
ANTICOAGULATION CONSULT NOTE - Follow Up Consult  Pharmacy Consult for heparin (apixaban on hold) Indication: atrial fibrillation/flutter  Allergies  Allergen Reactions  . Nsaids Other (See Comments)    Cannot take to due kidney issues  . Beta Adrenergic Blockers Other (See Comments)    An unnamed, white-colored Beta Blocker made him "feel funny" = made him feel "cagey"    Patient Measurements: Height: 5\' 9"  (175.3 cm) Weight: 235 lb 0.2 oz (106.6 kg) IBW/kg (Calculated) : 70.7  Vital Signs: Temp: 97.6 F (36.4 C) (10/01 0400) Temp Source: Axillary (10/01 0400) BP: 84/62 (10/01 0500) Pulse Rate: 70 (10/01 0000)  Labs: Recent Labs    04/21/18 0415  04/21/18 1115  04/22/18 0400 04/22/18 0413 04/22/18 0650 04/22/18 1233 04/22/18 1710 05/02/2018 0401  HGB 13.6   < > 13.0  --  13.2  --   --   --   --  13.1  HCT 43.0   < > 42.3  --  42.6  --   --   --   --  44.1  PLT 247  --  245  --  241  --   --   --   --  241  APTT  --   --   --   --  62*  --   --  95*  --  128*  LABPROT 27.2*  --   --   --   --   --  22.5*  --   --   --   INR 2.54  --   --   --   --   --  2.00  --   --   --   HEPARINUNFRC  --   --   --   --   --   --  >2.20* >2.20*  --  2.04*  CREATININE 4.82*   < >  --    < >  --  3.23*  --   --  3.02* 2.58*   < > = values in this interval not displayed.    Estimated Creatinine Clearance: 38 mL/min (A) (by C-G formula based on SCr of 2.58 mg/dL (H)).   Medical History: Past Medical History:  Diagnosis Date  . AICD (automatic cardioverter/defibrillator) present   . Anemia of chronic disease   . Arthritis   . Atrial flutter (HCC)    DCCV 9/15  . Chest pain on exertion   . Chronic systolic congestive heart failure, NYHA class 2 (HCC)    EF 20-25% 1/16  . CKD (chronic kidney disease) stage 3, GFR 30-59 ml/min (HCC)   . Dyslipidemia   . Gout    Of big toe  . Hyperglycemia   . Hyperlipidemia   . Hypertension   . Morbid obesity with BMI of 45.0-49.9, adult  (HCC)   . Non-ischemic cardiomyopathy (HCC)   . Organic erectile dysfunction   . OSA (obstructive sleep apnea)   . Pilonidal cyst   . Submandibular sialolithiasis    Right   Assessment: 57 year old male s/p code blue this weekend  Patient with history aflutter/cardioversion recently. Has been taking apixaban but given need for fistulagram this was stopped 9/27 and plan was to hold for 48 hours. Plans for fistulagram on hold indefinitely. Heparin to continue for now. CBC stable overnight, INR elevated at 2.0 in setting of  apixaban last dose 9/27.  Will monitor heparin based on aptt. Heparin drip 900 uts/hr aptt 128sec-above goal.     Goal of  Therapy:  Heparin level 0.3-0.7 units/ml aPTT 66-102 seconds Monitor platelets by anticoagulation protocol: Yes   Plan:  Decrease heparin infusion to 800 units/hr Check aptt and HL in 6 hours and daily while on heparin Continue to monitor H&H and platelets   Leota Sauers Pharm.D. CPP, BCPS Clinical Pharmacist [phone] 05/06/2018 5:48 AM

## 2018-05-24 NOTE — Progress Notes (Addendum)
Examined patient early in the day.  Spoke with bedside RN.  Patient less responsive today.  A second pressor was added on overnight and the norepi was increased.  Spoke with patient's significant other and mother in the waiting area.  Offered support.  Notes from later in the day reviewed.  Family will have peace that everything possible was done.  Norvel Richards, PA-C Palliative Medicine Pager: [phone]  Time 15 min.

## 2018-05-24 NOTE — Progress Notes (Signed)
 fentanyl wasted in sink with Interior and spatial designer

## 2018-05-24 NOTE — Progress Notes (Signed)
[facility] NEPHROLOGY PROGRESS NOTE  Assessment/ Plan: Pt is a 57 y.o. yo male ESRD on HD TTS, with syncopal episode, recurrent VT requiring shock.  HD: [facility]/ TTS 4.5h  122.5 kg  2/2 bath Hep none  RUA AVF - calc 0.5 ug tiw - venofer 7/10 completed 100 mg qhd  Impression/ Plan 1) Recurrent VT/ VT storm: chronic systolic heart failure with EF of 25 to 30%, hx of nonischemic cardiomyopathy/ indwelling ICD.  Currently on amiodarone, mexiletine.  2)  PEA arrest: on 9/28 rx'd CPR and epinephrine w/ ROSC (no shocks).  Remains on vent in ICU.  Also had VT in ED on admission 9/13 and had VT arrest 9/18 w/ brief CPR.    3) ESRD: HD TTS.  Was on CRRT from 9/18-9/23, then had another cardiac arrest on Sat 9/28. Now back on CRRT w/ high-dose pressors required for BP support.  Not a candidate for outpatient/long-term dialysis w/ cardiac instability.    4) Hyperkalemia: Improved after dialysis.  5) Anemia: Hemoglobin acceptable.   6) Shock/ hypotension: levophed IV, also on midodrine 15 mg 3 times daily.     Kelly Splinter MD [name] pgr [phone]   May 10, 2018, 12:18 PM    Subjective:  Seen and examined in ICU.  Pressor dose has increased.    Physical Exam: General: Lying in bed, intubated, sedated, ETT in place, on CRRT Heart: Regular rate rhythm, S1-S2 normal Lungs: Coarse breath sound bilateral  abdomen: Soft, nontender, nondistended Extremities: 1+ LE edema. Dialysis Access: Right upper extremity aVF faint bruit with no radiation.  Has temporary catheter.  Objective Vital signs in last 24 hours: Vitals:   May 10, 2018 0822 2018/05/10 0900 05/10/2018 1000 2018-05-10 1118  BP: (!) 109/51 (!) 85/54 (!) 89/30 (!) 91/40  Pulse: 69   67  Resp: _0 Temp:   (!) 94.5 F (34.7 C)   TempSrc:      SpO2: 100%   99%  Weight:      Height:        Labs: Basic Metabolic Panel: Recent Labs  Lab 04/22/18 0413 04/22/18 1710 05-10-2018 0401   NA 133* 133* 132*  K 5.2* 4.8 5.1  CL 100 99 101  CO2 22 21* 19*  GLUCOSE 122* 116* 119*  BUN 22* 22* 21*  CREATININE 3.23* 3.02* 2.58*  CALCIUM 8.4* 8.5* 8.5*  PHOS 3.4 3.6 4.7*   Liver Function Tests: Recent Labs  Lab 04/20/18 1207  04/21/18 0415  04/22/18 0413 04/22/18 1710 May 10, 2018 0401  AST 165*  --  177*  --   --   --   --   ALT 235*  --  238*  --   --   --   --   ALKPHOS 206*  --  196*  --   --   --   --   BILITOT 2.2*  --  2.6*  --   --   --   --   PROT 7.0  --  7.0  --   --   --   --   ALBUMIN 2.7*   < > 2.7*   < > 2.6* 2.5* 2.5*   < > = values in this interval not displayed.   No results for input(s): LIPASE, AMYLASE in the last 168 hours. No results for input(s): AMMONIA in the last 168 hours. CBC: Recent Labs  Lab 04/20/18 0725  04/20/18 1115 04/21/18 0415  04/21/18 1115 04/22/18 0400 05-10-18 0401  WBC 17.7*  --  22.4* 17.0*  --  21.5* 25.6* 36.4*  NEUTROABS 13.3*  --  20.4* 13.0*  --   --   --   --   HGB 14.5   < > 13.8 13.6   < > 13.0 13.2 13.1  HCT 46.9   < > 44.1 43.0   < > 42.3 42.6 44.1  MCV 95.3  --  95.7 93.3  --  96.1 97.0 100.5*  PLT 287  --  260 247  --  245 241 241   < > = values in this interval not displayed.   Cardiac Enzymes: No results for input(s): CKTOTAL, CKMB, CKMBINDEX, TROPONINI in the last 168 hours. CBG: Recent Labs  Lab 04/20/18 1611  GLUCAP 121*   Medications: Infusions: . sodium chloride    . ceFEPime (MAXIPIME) IV Stopped (2018/05/05 0951)  . fentaNYL infusion INTRAVENOUS 40 mcg/hr (May 05, 2018 1200)  . heparin 800 Units/hr (2018/05/05 1200)  . heparin    . norepinephrine (LEVOPHED) Adult infusion 40 mcg/min (May 05, 2018 1200)  . dialysis replacement fluid (prismasate) 500 mL/hr at May 05, 2018 1132  . dialysis replacement fluid (prismasate) 300 mL/hr at 05-May-2018 1130  . dialysate (PRISMASATE) 2,000 mL/hr at 2018/05/05 1123  . vancomycin Stopped (May 05, 2018 1059)  . vasopressin (PITRESSIN) infusion - *FOR SHOCK* 0.03 Units/min  (05-May-2018 1200)    Scheduled Medications: . allopurinol  100 mg Oral BID  . amiodarone  200 mg Oral BID  . chlorhexidine gluconate (MEDLINE KIT)  15 mL Mouth Rinse BID  . feeding supplement (PRO-STAT SUGAR FREE 64)  30 mL Oral BID  . mouth rinse  15 mL Mouth Rinse 10 times per day  . mexiletine  150 mg Oral Q12H  . midodrine  10 mg Oral TID WC  . multivitamin  1 tablet Oral QHS  . pantoprazole sodium  40 mg Per Tube Daily  . sodium chloride flush  10-40 mL Intracatheter Q12H  . sodium chloride flush  3 mL Intravenous Q12H  . sodium chloride flush  3 mL Intravenous Q12H  . THROMBI-PAD  1 each Topical Once    have reviewed scheduled and prn medications.

## 2018-05-24 NOTE — Progress Notes (Addendum)
Patient ID: Eric Murillo, male   DOB: Nov 10, 1960, 57 y.o.   MRN: [id number]     Advanced Heart Failure Rounding Note  PCP-Cardiologist: Loralie Champagne, MD   Subjective:    Presented to [facility] ED after a syncopal episode. 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 Transferred to [facility] [place] and had another episode of VT in ED that required ATP.   9/18 VT arrest. Amio drip restarted but now back to po. No further arrythmias.   9/28: PEA arrest, treated with CPR and epinephrine (no shocks) with ROSC.    Remains intubated 40% FiO2. NE had to be increased to 35 mcg/kg/min and vaso had to be added overnight. Back on CVVHD, pulling about even due to low BPs. MAPs ~60. CVP 5.  Afebrile. Remains on Vanc/Cefepime. WBC 21.5 > 25.6 > 36.4. Blood cultures 9/28 NGTDx3.  Remains in NSR. No VT.  Palliative met with family yesterday. He remains full code. Still following commands per RN.  RHC/LHC 03/24/2018  1. Low, but not markedly low, cardiac output.  2. Pulmonary venous hypertension.  3. Markedly elevated right and left heart filling pressures.   4. No significant coronary disease.   RHC Procedural Findings: Hemodynamics (mmHg) RA mean 18 RV 65/17 PA 69/30, mean 47 PCWP mean 36 LV 100/27 AO 97/65 Oxygen saturations: PA 52% AO 94% Cardiac Output (Fick) 4.98  Cardiac Index (Fick) 2.12 PVR 2.2 WU Cardiac Output (Thermo) 4.85 Cardiac Index (Thermo) 2.06  PVR 2.3 WU  Objective:   Weight Range: 106.6 kg Body mass index is 34.71 kg/m.   Vital Signs:   Temp:  [97.6 F (36.4 C)-98.1 F (36.7 C)] 97.6 F (36.4 C) (10/01 0400) Pulse Rate:  [70-90] 70 (10/01 0000) Resp:  [0-23] 20 (10/01 0600) BP: (77-135)/(31-75) 91/65 (10/01 0600) SpO2:  [94 %-99 %] 97 % (10/01 0400) Arterial Line BP: (95-132)/(47-58) 101/48 (10/01 0600) FiO2 (%):  [40 %] 40 % (10/01 0400) Weight:  [106.6 kg] 106.6 kg (10/01 0500) Last BM Date: 04/15/18  Weight change: Filed Weights   04/21/18 0645 04/22/18 0400 05/15/2018 0500  Weight: 109 kg 108 kg 106.6 kg    Intake/Output:   Intake/Output Summary (Last 24 hours) at 05-15-18 0704 Last data filed at May 15, 2018 0700 Gross per 24 hour  Intake 1779.9 ml  Output 2547 ml  Net -767.1 ml      Physical Exam   General: Intubated/sedated.  HEENT: + ETT Neck: Supple. JVP difficult. Carotids 2+ bilat; no bruits. No thyromegaly or nodule noted. Left IJ HD cath. Cor: PMI nonpalpable. RRR, No M/G/R noted Lungs: Diminished basilar sounds Abdomen: Soft, non-tender, non-distended, no HSM. No bruits or masses. +BS  Extremities: No cyanosis, clubbing, or rash. BLE no edema.  Neuro: Intubated/sedated   Telemetry   NSR 70s with 1st degree AV block. Personally reviewed.   Labs    CBC Recent Labs    04/20/18 1115 04/21/18 0415  04/22/18 0400 2018-05-15 0401  WBC 22.4* 17.0*   < > 25.6* 36.4*  NEUTROABS 20.4* 13.0*  --   --   --   HGB 13.8 13.6   < > 13.2 13.1  HCT 44.1 43.0   < > 42.6 44.1  MCV 95.7 93.3   < > 97.0 100.5*  PLT 260 247   < > 241 241   < > = values in this interval not displayed.   Basic Metabolic Panel Recent Labs    04/22/18 0400  04/22/18 1710 05-15-18 0401  NA  --    < >  133* 132*  K  --    < > 4.8 5.1  CL  --    < > 99 101  CO2  --    < > 21* 19*  GLUCOSE  --    < > 116* 119*  BUN  --    < > 22* 21*  CREATININE  --    < > 3.02* 2.58*  CALCIUM  --    < > 8.5* 8.5*  MG 2.5*  --   --  2.7*  PHOS  --    < > 3.6 4.7*   < > = values in this interval not displayed.   Liver Function Tests Recent Labs    04/20/18 1207  04/21/18 0415  04/22/18 1710 May 18, 2018 0401  AST 165*  --  177*  --   --   --   ALT 235*  --  238*  --   --   --   ALKPHOS 206*  --  196*  --   --   --   BILITOT 2.2*  --  2.6*  --   --   --   PROT 7.0  --  7.0  --   --   --   ALBUMIN 2.7*   < > 2.7*   < > 2.5* 2.5*   < > = values in this interval not displayed.   No results for input(s): LIPASE, AMYLASE in the last  72 hours. Cardiac Enzymes No results for input(s): CKTOTAL, CKMB, CKMBINDEX, TROPONINI in the last 72 hours.  BNP: BNP (last 3 results) Recent Labs    07/26/17 0916 11/03/17 1238 11/13/17 0400  BNP 399.7* 1,160.1* 1,085.3*    ProBNP (last 3 results) No results for input(s): PROBNP in the last 8760 hours.   D-Dimer No results for input(s): DDIMER in the last 72 hours. Hemoglobin A1C No results for input(s): HGBA1C in the last 72 hours. Fasting Lipid Panel No results for input(s): CHOL, HDL, LDLCALC, TRIG, CHOLHDL, LDLDIRECT in the last 72 hours. Thyroid Function Tests No results for input(s): TSH, T4TOTAL, T3FREE, THYROIDAB in the last 72 hours.  Invalid input(s): FREET3  Other results:   Imaging    No results found.   Medications:     Scheduled Medications: . allopurinol  100 mg Oral BID  . amiodarone  200 mg Oral BID  . calcitRIOL  0.5 mcg Oral Q T,Th,Sa-HD  . chlorhexidine gluconate (MEDLINE KIT)  15 mL Mouth Rinse BID  . Chlorhexidine Gluconate Cloth  6 each Topical Q0600  . feeding supplement (NEPRO CARB STEADY)  237 mL Oral TID BM  . feeding supplement (PRO-STAT SUGAR FREE 64)  30 mL Oral BID  . mouth rinse  15 mL Mouth Rinse 10 times per day  . mexiletine  150 mg Oral Q12H  . midodrine  10 mg Oral TID WC  . multivitamin  1 tablet Oral QHS  . pantoprazole sodium  40 mg Per Tube Daily  . sodium chloride flush  10-40 mL Intracatheter Q12H  . sodium chloride flush  3 mL Intravenous Q12H  . sodium chloride flush  3 mL Intravenous Q12H  . THROMBI-PAD  1 each Topical Once    Infusions: . sodium chloride    . sodium chloride    . sodium chloride    . ceFEPime (MAXIPIME) IV 2 g (04/22/18 2205)  . fentaNYL infusion INTRAVENOUS 50 mcg/hr (05/18/18 4287)  . heparin 900 Units/hr (05/18/2018 0119)  . heparin    . norepinephrine (  LEVOPHED) Adult infusion 25 mcg/min (05/19/18 0117)  . dialysis replacement fluid (prismasate) 500 mL/hr at 2018/05/19 0119  .  dialysis replacement fluid (prismasate) 300 mL/hr at 04/22/18 1439  . dialysate (PRISMASATE) 2,000 mL/hr at 05/19/18 6789  . vancomycin Stopped (04/22/18 1204)  . vasopressin (PITRESSIN) infusion - *FOR SHOCK* 0.03 Units/min (2018-05-19 0611)    PRN Medications: sodium chloride, sodium chloride, sodium chloride, acetaminophen, alteplase, bismuth subsalicylate, docusate, fentaNYL, heparin, heparin, HYDROcodone-acetaminophen, lidocaine (PF), lidocaine-prilocaine, midazolam, midazolam, ondansetron (ZOFRAN) IV, pentafluoroprop-tetrafluoroeth, promethazine, senna-docusate, sodium chloride flush, sodium chloride flush, sorbitol    Patient Profile   Conway Fedora is a 57 y.o. male with history of morbid obesity, hyperlipidemia, HTN, atrial flutter (s/p TEE DC-CV 04/03/14 and again in 8/17), NICM with chronic systolic HF and severe OSA but unable to tolerate CPAP.    Assessment/Plan   1. PEA arrest: 9/28.  Patient now intubated and back on CVVH/norepinephrine. ?Development of septic shock with rise in WBCs versus due to hyperkalemia with missed HD.  Tmax 100.7. WBC trending up 25.6. Blood cultures NGTD.  Prognosis at this point is very poor, patient is still full code.  Following commands per RN.  - Palliative met with family yesterday. He remains a full code.  2. Recurrent VT requiring shock and ATP: Follows with Dr Caryl Comes. EP has been consulted. Last saw Dr Caryl Comes 04/03/18 and was having recurrent episodes of VF. Mexilitine was added in addition to his amio.  Coronary angiography this admission showed no CAD. Cardiac output was low but not markedly so.  Concern for VT as sign of end stage cardiomyopathy.   - No further VT. No change. - On po amiodarone and mexilitene. Ranexa on hold (unable to crush) 3. Chronic systolic heart failure: Nonischemic cardiomyopathy, likely related to HTN.  Echo in 8/14 with EF 45% but EF down to 25-30% on TEE while in atrial flutter (03/2014).  Echo (1/16) with EF ~25% and  echo in 6/16 with EF 30-35%.  No cardiac MRI done with elevated creatinine and size. There was concern for cardiac amyloidosis.  However, negative SPEP and abdominal fat pad biopsy negative. Low voltage on ECG may be due to obesity and not amyloidosis. Echo 10/2017: EF 20-25%, mild to mod MR. S/P [employer] ICD.  Marked volume overload by RHC this admission.  Unable get fluid off with HD initially due to hypotension and required CVVH.  Improved to the point where he could go back on iHD, but then had PEA arrest and now back on norepinephrine + CVVH. Vasopressin added overnight.  - CVVH for volume management.  - Continue midodrine 10 mg tid.  - NE @ 25 mcg/kg/min this am. MAPs ~60. Vasopressin added overnight.  - He has end stage HF, not candidate for LVAD and unlikely to be able to get to heart/kidney transplant.  4. Atypical Atrial Flutter: Recurrent.  S/p several DCCVs, most recently 11/21/17. Has been seen by EP. Not felt to be amenable to ablation.  - Maintaining NSR. - Remains on heparin drip. Can transition to Eliquis again eventually if no bleeding.  5. ESRD: Previously on HD T/R/Sat.  Intolerant iHD earlier this admission, hypotensive when trying to pull off excess fluid and had VT. He initially required norepinephrine and CVVH, was back on iHD but with PEA arrest 9/28, he is not back on norepinephrine and CVVH. Pulling even this am. 6. OSA: Does not tolerate CPAP. Intubated currently 7. Morbid Obesity:   Body mass index is 34.71 kg/m.  8. ID:  ?  component of septic shock as WBCs had been rising.  Will have to follow cultures and fever curve closely.  CXR w/o PNA.  - Continue vanc/cefepime for now. Consider adding zosyn to cover anaerobes.  - WBC up to 36.4. Hypothermic. Blood cultures NGTD 04/20/18. 9. Pill dysphagia: Barium swallow 9/25 was normal.   Georgiana Shore NP 05/21/18  7:04 AM  Advanced Heart Failure Team Pager [phone] (M-F; [place])  Please contact [facility] for  night-coverage after hours (4p -7a ) and weekends on [email or web address]  Patient seen with NP, agree with the above note.   He has been progressively hypotensive and hypothermic.  Currently on norepinephrine 40 + vasopressin 0.03 with MAP stablized in 60s.  Getting fluid back via CVVH.  No arrhythmias. Cultures negative so far.   On exam, sedated but per nursing will still awaken and follow commands.  JVP difficult.  Lungs clear.  No peripheral edema.   Clinical picture continues to worsen.  Suspect we have combination of septic/cardiogenic shock at this point.  Now on high dose pressors.  With recent multiple episodes of VT, would be concerned about adding epinephrine or dobutamine/dopamine.  He is not an LVAD/transplant candidate.  I do not think that he is going to get to the point where he will tolerate intermittent hemodialysis.  Family meeting with palliative care yesterday, plan to continue full code at that time.   - Continue broad spectrum abx for suspected septic shock component.   - Continue current pressors.   - Continue CVVH.  - Will discuss situation with family again.  I do not think that he is going to recover at this point and would recommend palliative care. Agree with Dr. Pura Spice assessment that he would be a very poor candidate for tracheostomy.    CRITICAL CARE Performed by: Loralie Champagne  Total critical care time: 35 minutes  Critical care time was exclusive of separately billable procedures and treating other patients.  Critical care was necessary to treat or prevent imminent or life-threatening deterioration.  Critical care was time spent personally by me on the following activities: development of treatment plan with patient and/or surrogate as well as nursing, discussions with consultants, evaluation of patient's response to treatment, examination of patient, obtaining history from patient or surrogate, ordering and performing treatments and interventions, ordering and review  of laboratory studies, ordering and review of radiographic studies, pulse oximetry and re-evaluation of patient's condition.  Loralie Champagne 2018/05/21 10:17 AM

## 2018-05-24 NOTE — Progress Notes (Signed)
OT Cancellation Note and Discharge  Patient Details Name: Anthonny Burkley MRN: [id number] DOB: [birth year]   Cancelled Treatment:    Reason Eval/Treat Not Completed: Spoke with RN and pt is not medically stable to work with, we will sign off for now. Please re-order as appropriate.  Zetta Bills, OTR/L Acute Rehab Services Pager [phone] Office [phone]    May 15, 2018, 10:36 AM

## 2018-05-24 NOTE — Consult Note (Signed)
WOC consulted for MASD (gluteal cleft). Attempted to see this patient today but his nurse reports that every time they turn him his pressures bottom out and he is on CVVHD as well.  I have offered to add him to our [facility] nurse follow up list and check on him daily until we can assess.  I did update his orders for care and he is on a Progressa LALM in the ICU.  Microshifts in his position are recommended.   Eric Murillo [facility], CNS, [employer] [phone]

## 2018-05-24 NOTE — Code Documentation (Signed)
  Patient Name: Eric Murillo   MRN: [id number]   Date of Birth/ Sex: 14-Aug-1960 , male      Admission Date: 04/22/2018  Attending Provider: Laurey Morale, MD  Primary Diagnosis: Ventricular tachycardia (HCC) [I47.2] ESRD (end stage renal disease) (HCC) [N18.6]   Indication: Pt was in his usual state of health until this PM, when he was noted to be in PEA. Code blue was subsequently called. At the time of arrival on scene, ACLS protocol was underway.   Technical Description:  - CPR performance duration:  3 minute  - Was defibrillation or cardioversion used? No   - Was external pacer placed? No  - Was patient intubated pre/post CPR? No   Medications Administered: Amiodarone    Atropine    Calcium    Epinephrine    Lidocaine    Magnesium    Norepinephrine    Phenylephrine    Sodium bicarbonate    Vasopressin    Other    Post CPR evaluation:  - Final Status - Was patient successfully resuscitated ? No   Miscellaneous Information:  - Time of death:  1:10 PM  - Primary team notified?  Yes  - Family Notified? Yes     Synetta Shadow, MD   05/20/2018, 1:20 PM

## 2018-05-24 NOTE — Progress Notes (Signed)
Was called for patient in near brady arrest, family was called in, ACLS was followed for 3 minutes.  Upon family's arrival, patient still had a pulse and was very hypotensive.  Spoke with the family at length, after discussion, decision was made to stop resuscitation.  Family did not want to stop any interventions but now understand that patient will not survive as residual from our previous conversation.  Will make patient a full DNR with no further escalation of care.   The patient is critically ill with multiple organ systems failure and requires high complexity decision making for assessment and support, frequent evaluation and titration of therapies, application of advanced monitoring technologies and extensive interpretation of multiple databases.   Critical Care Time devoted to patient care services described in this note is  45  Minutes. This time reflects time of care of this signee Dr Koren Bound. This critical care time does not reflect procedure time, or teaching time or supervisory time of PA/NP/Med student/Med Resident etc but could involve care discussion time.  Alyson Reedy, M.D. [facility] Pulmonary/Critical Care Medicine. Pager: [phone]. After hours pager: [phone].

## 2018-05-24 NOTE — Progress Notes (Signed)
Met with the entire family including brother over the phone, informed them that patient is not a longterm dialysis candidate.  Given his prognosis then tracheostomy is futile care and he is not a candidate for it.  They were also informed that maximum dose of levophed is 40 and that we are 37, he is no all the pressors that are safe for him.  They were also informed that it is time for the family to come in to see patient as we anticipate him to arrest today.    Will place max levo to 40, vaso 0.03, no further addition of any pressors as discussed in my note earlier.  Dr. Aundra Dubin was present for the conversation and input was appreciated.   The patient is critically ill with multiple organ systems failure and requires high complexity decision making for assessment and support, frequent evaluation and titration of therapies, application of advanced monitoring technologies and extensive interpretation of multiple databases.   Critical Care Time devoted to patient care services described in this note is  45  Minutes. This time reflects time of care of this signee Dr Jennet Maduro. This critical care time does not reflect procedure time, or teaching time or supervisory time of PA/NP/Med student/Med Resident etc but could involve care discussion time.  Rush Farmer, M.D. [facility] Pulmonary/Critical Care Medicine. Pager: [phone]. After hours pager: [phone].

## 2018-05-24 NOTE — Progress Notes (Signed)
Name: Eric Murillo MRN: [id number] DOB: [birth year]    ADMISSION DATE:  03/24/2018 CONSULTATION DATE: 12 20, 2019  REFERRING MD : Cardiology service CHIEF COMPLAINT: Post cardiac arrest due to PEA with bradycardia   HISTORY OF PRESENT ILLNESS:   Patient is a 57 year old African-American male with past medical history of end-stage renal disease hemodialysis noncompliant hypertension hyperlipidemia.  Patient is also known to have recurrent V. tach chronic systolic heart failure 20-25% nonischemic cardiomyopathy who was here since September 16 he was admitted this time with syncope.  9/16 Cath >> no significant CAD, acutely elevated right and left filling pressures  9/18 VT arrest. Amio drip restarted   9/28: PEA arrest, treated with CPR and epinephrine (no shocks) with ROSC.   9/30: severe hypotension overnight required addition of a second pressor and the increase in levophed to   ETT 9/28 >> [facility] HD cath 9/28 >> Rt fem CVL 9/28 >>  Blood 9/28>>>NTD Sputum 9/20>>>NTD  Vanc 9/28>>> Cefepime 9/28>>>  SUBJECTIVE:  Hypotension overnight required addition of pressors and increase in levophed, responsive this AM  VITAL SIGNS: Temp:  [94.1 F (34.5 C)-98.1 F (36.7 C)] 94.1 F (34.5 C) (10/01 0800) Pulse Rate:  [69-90] 69 (10/01 0822) Resp:  [0-23] 20 (10/01 0822) BP: (77-135)/(31-75) 109/51 (10/01 0822) SpO2:  [94 %-100 %] 100 % (10/01 0822) Arterial Line BP: (95-132)/(47-57) 109/50 (10/01 0800) FiO2 (%):  [40 %] 40 % (10/01 0822) Weight:  [106.6 kg] 106.6 kg (10/01 0500)  PHYSICAL EXAMINATION: General: Chronically ill appearing, NAD HEENT: [place]/AT, PERRL, EOM-I and MMM Lungs: Coarse BS diffusely Heart: RRR, Nl S1/S2 and -M/R/G Abdomen: Soft, NT, ND and +BS Neuro: Opens eyes but not breathing over the Skin: intact other than above  Recent Labs  Lab 04/22/18 0413 04/22/18 1710 05-07-18 0401  NA 133* 133* 132*  K 5.2* 4.8 5.1  CL 100 99 101  CO2 22 21* 19*    BUN 22* 22* 21*  CREATININE 3.23* 3.02* 2.58*  GLUCOSE 122* 116* 119*   Recent Labs  Lab 04/21/18 1115 04/22/18 0400 2018/05/07 0401  HGB 13.0 13.2 13.1  HCT 42.3 42.6 44.1  WBC 21.5* 25.6* 36.4*  PLT 245 241 241   Dg Chest Port 1 View  Result Date: 05/07/18 CLINICAL DATA:  Respiratory failure EXAM: PORTABLE CHEST 1 VIEW COMPARISON:  04/21/2018 FINDINGS: Support devices are unchanged. Low volumes with bibasilar atelectasis and borderline heart size. No visible effusions. IMPRESSION: Low lung volumes, bibasilar atelectasis. Electronically Signed   By: Charlett Nose M.D.   On: 05/07/2018 08:12   I reviewed CXR myself, ETT is in a good position from 9/29  ASSESSMENT / PLAN:  57 year old with significant non-compliance history and multiple episodes of VF who presents to P[facility]M after a cardiac arrest followed by cards and EP.  Discussed with RT and bedside RN  Acute respiratory failure post cardiac arrest- Decrease sedation as much as able No extubation given hemodynamics, overnight events and overall physical condition Adjust vent for ABG Titrte O2 for sat of 88-92% CXR in AM Patient is not a tracheostomy candidate  Status post cardiac arrest PEA arrest with bradycardia most likely due to hyperkalemia  With missed dialysis. He remains on oral amiodarone, mexiletine but Ranexa is off due to renal failure Will meet with family and recommend full DNR  Cardiogenic/Septic shock: Levophed up to 37 mcg, will place a cap of 40 mcg Vasopressin 0.03 Not a candidate for epi given recurrent VF/VT Not a candidate  for dopamine/dobutamine due to arrhythmia Not a candidate for neo given increase in afterload in a systolic heart failure patient with increase in afterload So realistically from a pressor standpoint the options are limited to levophed and vasopressin and further increase will not be helpful here Not a candidate for LVAD/transplant due to BMI and ESRD KVO IVF  ESRD: CRRT while  on pressors, negative 50-100 ml/hr if BP permits but will hold for today given arrhythmia Replace electrolytes as indicated KVO IVF BMET in AM  Chronic systolic heart failure -being managed by cardiology, not a candidate for LVAD per cards  Anoxic injury: patient is following commands this AM but more lethargic this AM and not breathing over the vent If hemodynamics allow will need a more formal neuro evaluation May need MRI if there are significant changes but not with current hemodynamics  Recurrent atrial flutter Heparin drip  HCAP: CXR is clear so doubtful, cultures are negative Check PCT now (acknowledge patient on CRRT but will trend) If PCT is low and no fever overnight will consider d/c in AM  The patient is critically ill with multiple organ systems failure and requires high complexity decision making for assessment and support, frequent evaluation and titration of therapies, application of advanced monitoring technologies and extensive interpretation of multiple databases.   Critical Care Time devoted to patient care services described in this note is  45  Minutes. This time reflects time of care of this signee Dr Koren Bound. This critical care time does not reflect procedure time, or teaching time or supervisory time of PA/NP/Med student/Med Resident etc but could involve care discussion time.  Alyson Reedy, M.D. [facility] Pulmonary/Critical Care Medicine. Pager: [phone]. After hours pager: [phone].  05/20/2018, 9:01 AM

## 2018-05-24 DEATH — deceased

## 2019-05-21 IMAGING — DX DG CHEST 1V PORT
1 series · 1 of 1 positions shown · non-contrast
Comparison: Chest x-ray of November 03, 2017

CLINICAL DATA: Status post dialysis catheter insertion.

EXAM:
PORTABLE CHEST 1 VIEW

[chest]
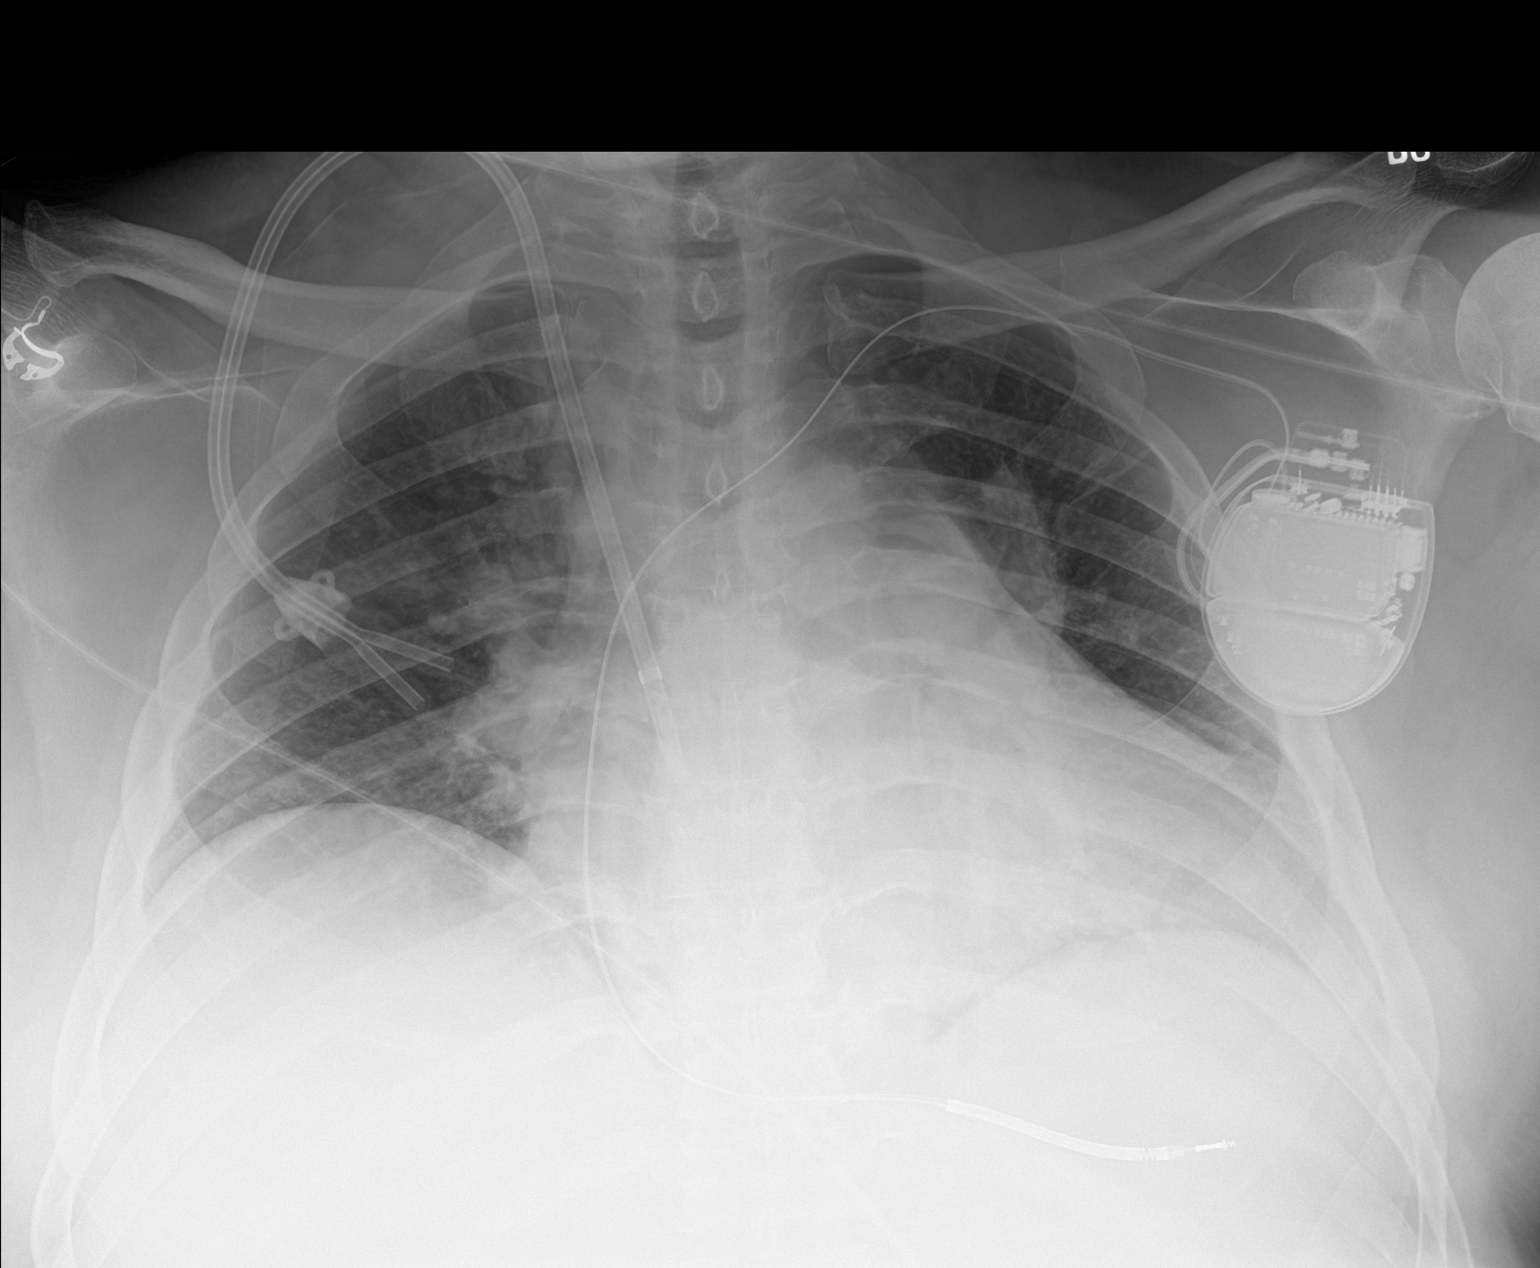

[1 of 1 positions shown; findings below may reference images not displayed]

FINDINGS: The dual-lumen dialysis catheter tip projects over the distal third
of the SVC. There is no postprocedure pneumothorax or hemothorax.
Both lungs are mildly hypoinflated. The cardiac silhouette is
enlarged and the central pulmonary vascularity is engorged. The
interstitial edema has improved. The ICD is in stable position.
There is calcification in the wall of the aortic arch.
IMPRESSION: No postprocedure complication following dialysis catheter placement.

Improved pulmonary edema.

Thoracic aortic atherosclerosis.

## 2019-05-21 IMAGING — DX DG CHEST 1V PORT
1 series · 1 of 1 positions shown · non-contrast
Comparison: 11/12/2017, 11/03/2017

CLINICAL DATA: OG and ETT placement

EXAM:
PORTABLE CHEST 1 VIEW

[chest ap]
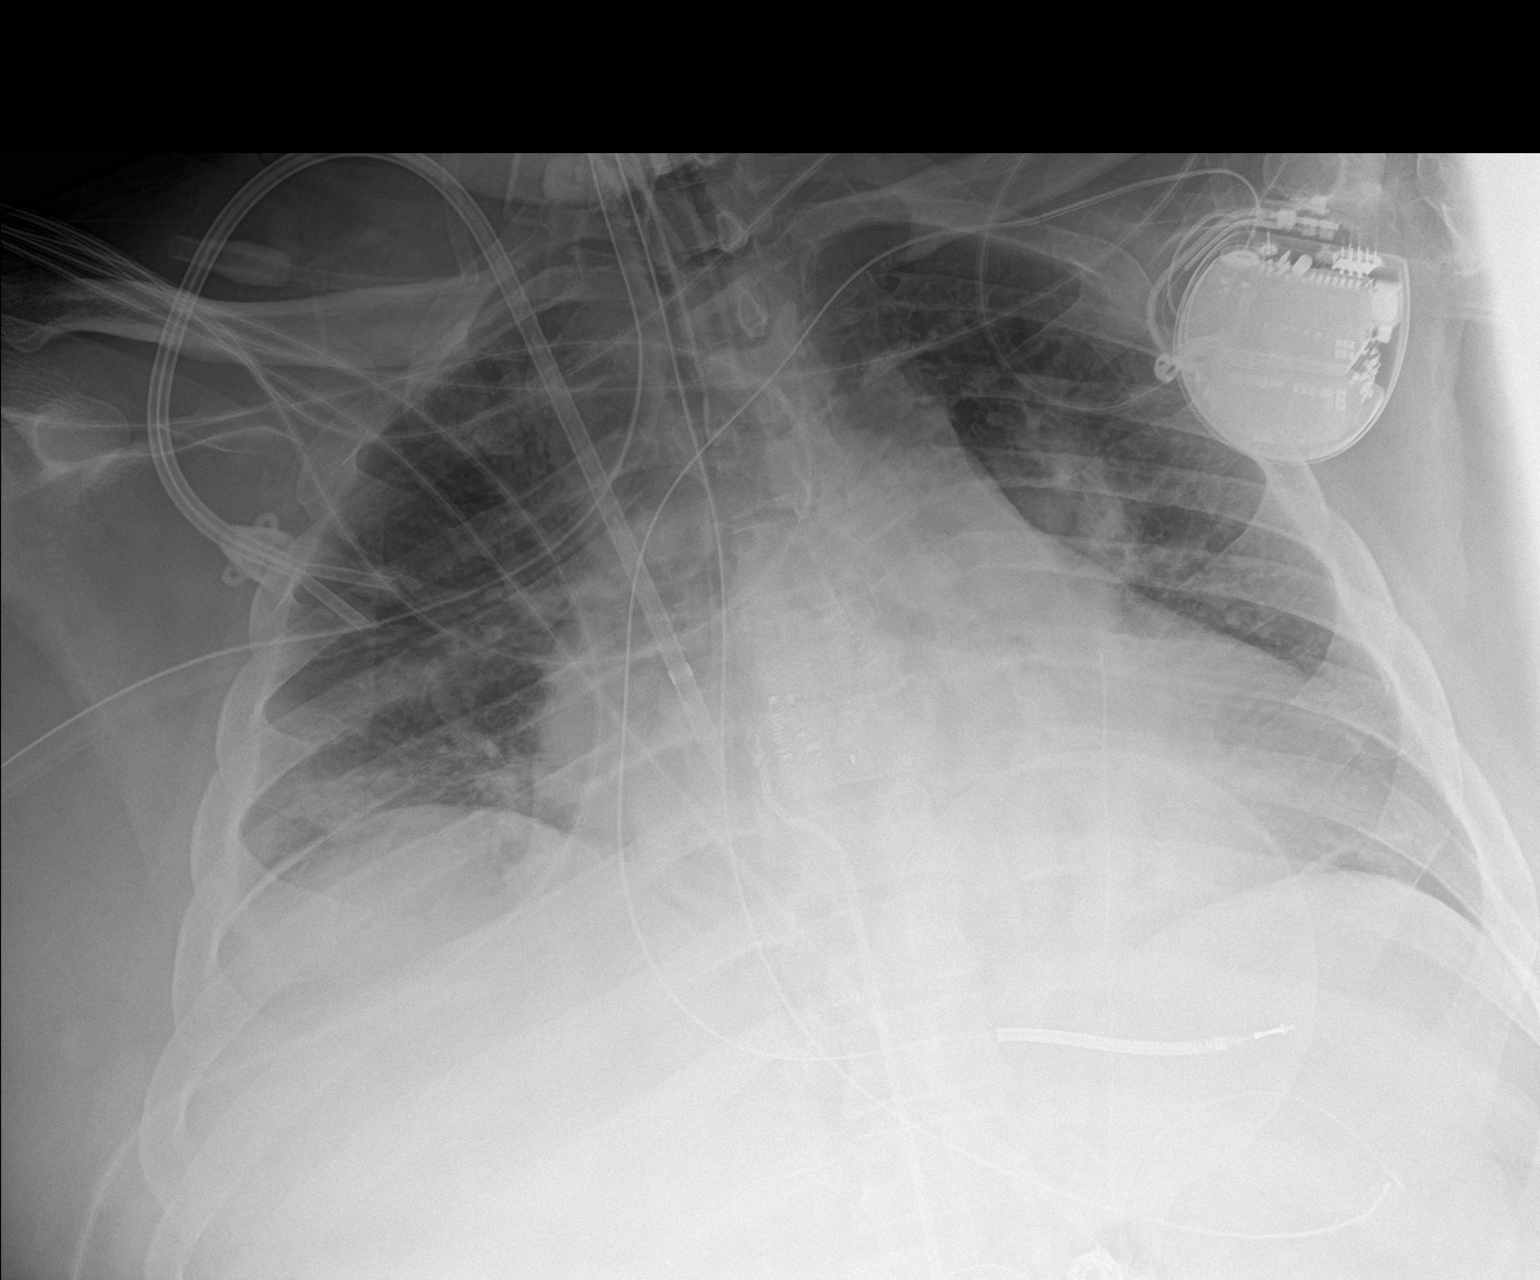

[1 of 1 positions shown; findings below may reference images not displayed]

FINDINGS: Endotracheal tube tip is about 2.9 cm superior to the carina.
Esophageal tube tip is in the left upper quadrant. Right-sided
central venous catheter tip overlies the right atrium. Low lung
volumes. Cardiomegaly with vascular congestion. Foci of airspace
disease in the bilateral lung bases and left upper lobe. No
pneumothorax. Left-sided pacing device similar in position.
IMPRESSION: 1. Endotracheal tube tip about 2.9 cm superior to carina. Esophageal
tube tip is in the left upper quadrant of the abdomen
2. Cardiomegaly with vascular congestion.
3. Hypoventilatory changes. Developing foci of airspace disease in
the bilateral lung bases and left upper lobe, atelectasis versus
pneumonia.

## 2019-05-22 IMAGING — DX DG CHEST 1V PORT
1 series · 1 of 1 positions shown · non-contrast
Comparison: November 12, 2017

CLINICAL DATA: Hypoxia

EXAM:
PORTABLE CHEST 1 VIEW

[chest ap]
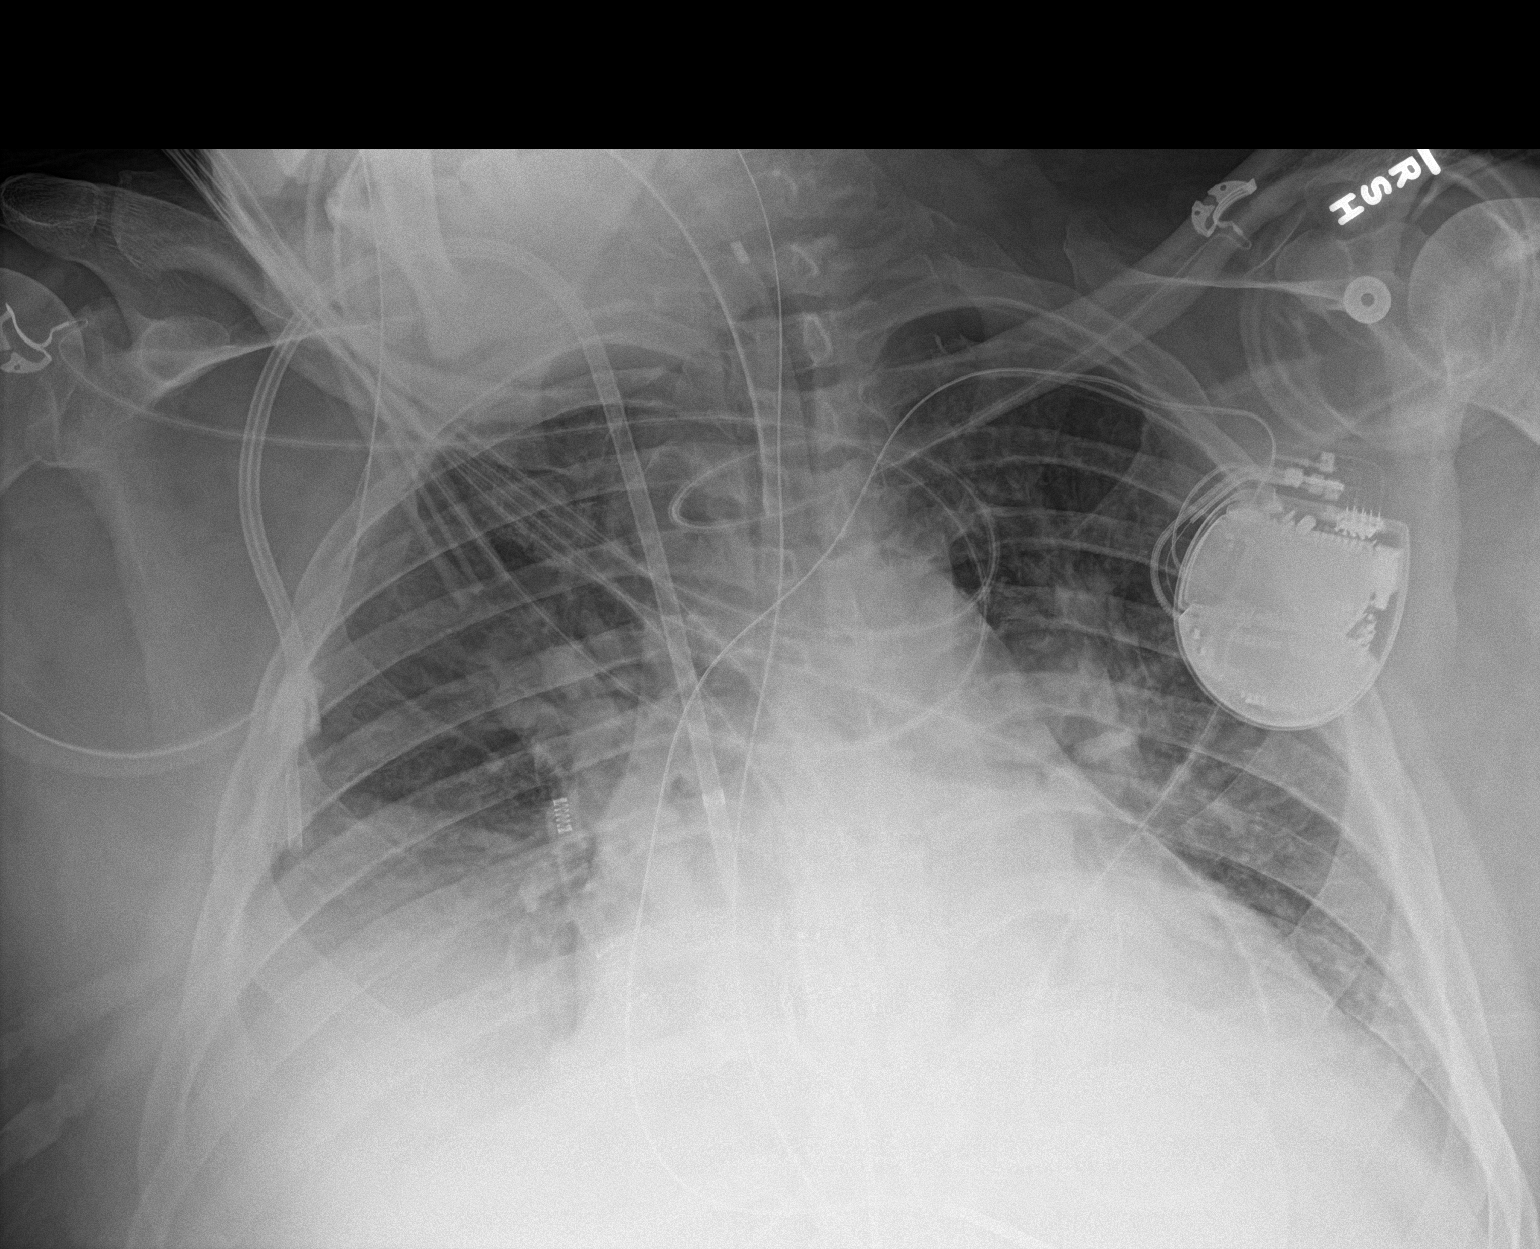

[1 of 1 positions shown; findings below may reference images not displayed]

FINDINGS: Endotracheal tube tip is 4.4 cm above the carina. Central catheter
tip is in the right atrium just beyond the cavoatrial junction,
stable. Nasogastric tube tip and side port are below the diaphragm.
Pacemaker lead attached to right ventricle. No pneumothorax. There
is a small right pleural effusion. There is bibasilar atelectasis.
There is mild cardiomegaly with pulmonary vascularity within normal
limits. No adenopathy. No bone lesions.
IMPRESSION: And catheter positions as described without pneumothorax. Pacemaker
lead attached to right ventricle. Right pleural effusion with
bibasilar atelectasis. Stable cardiac prominence.

## 2019-05-23 IMAGING — DX DG CHEST 1V PORT
1 series · 1 of 1 positions shown · non-contrast
Comparison: 11/13/2017.

CLINICAL DATA: ET tube present.  Respiratory failure.

EXAM:
PORTABLE CHEST 1 VIEW

[chest ap]
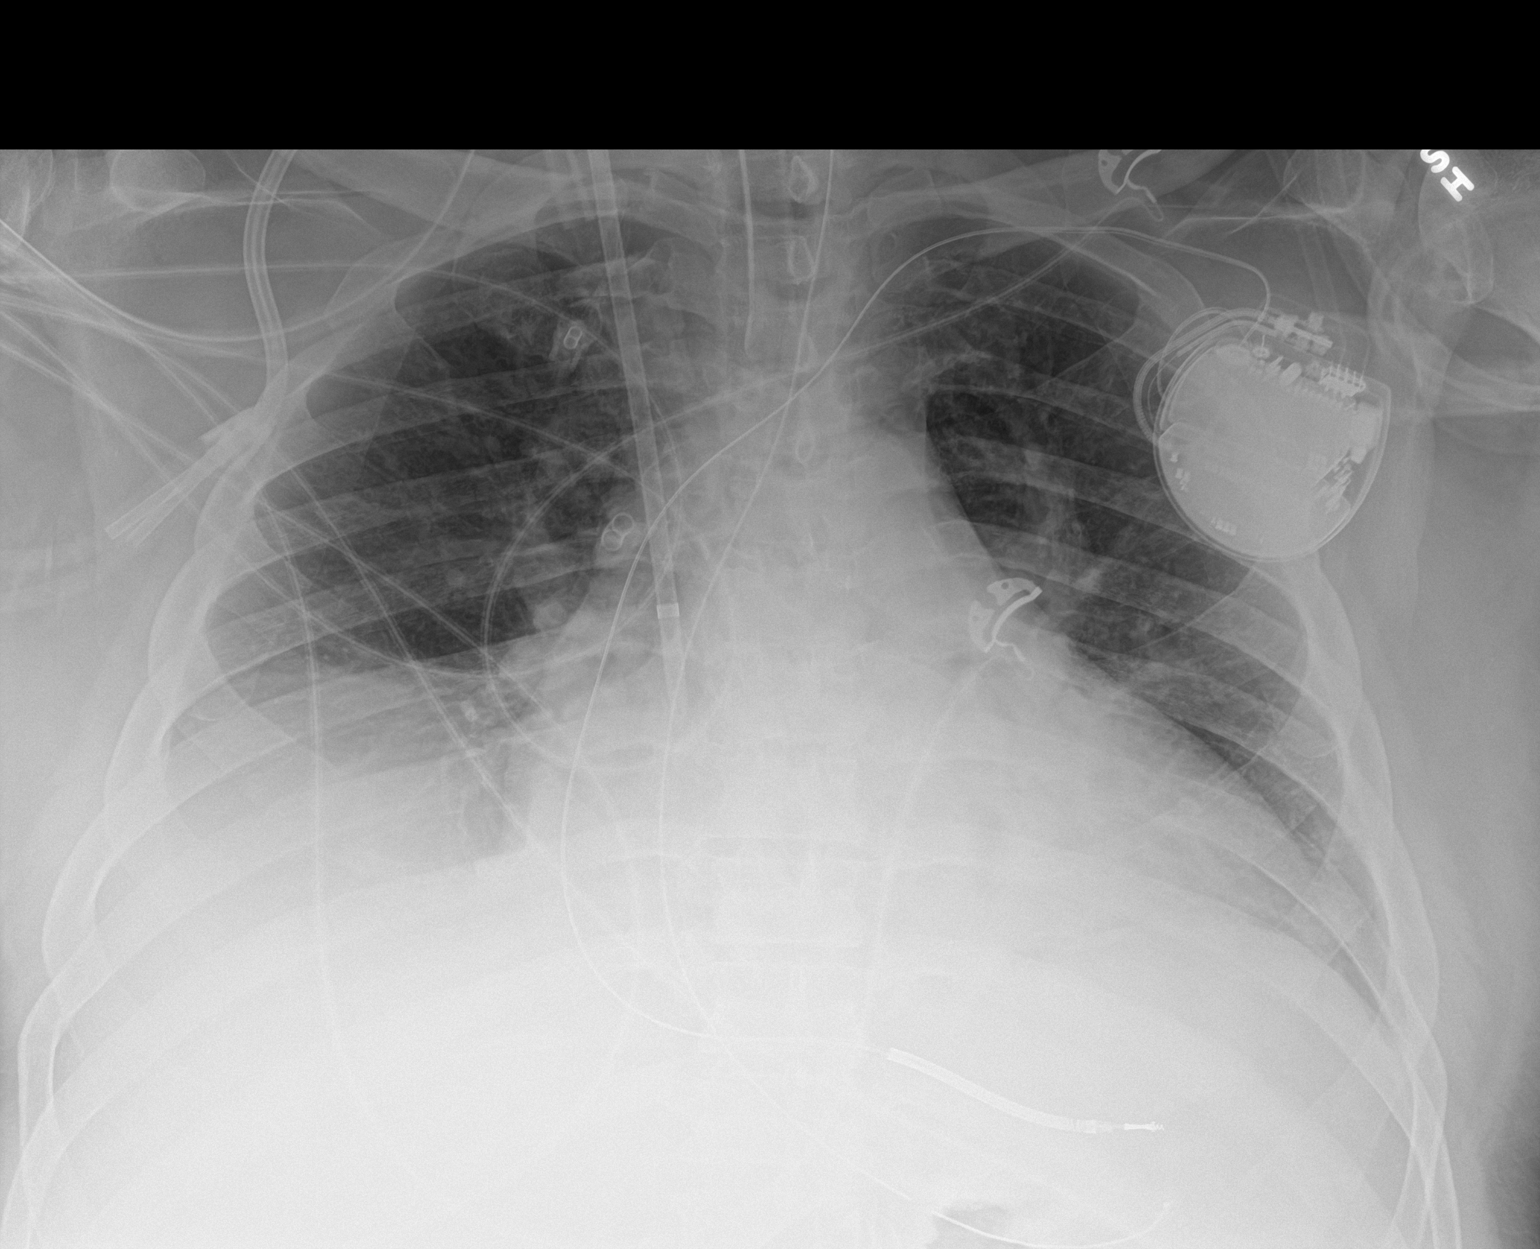

[1 of 1 positions shown; findings below may reference images not displayed]

FINDINGS: Stable cardiomegaly. Support tubes and lines are stable. BILATERAL
pulmonary opacities and effusions, not significantly improved. No
pneumothorax.
IMPRESSION: Stable chest.  No significant worsening or improvement.

## 2019-05-24 IMAGING — DX DG CHEST 1V
1 series · 1 of 1 positions shown · non-contrast
Comparison: 11/14/2017.

CLINICAL DATA: Shortness of breath.

EXAM:
CHEST  1 VIEW

[chest ap]
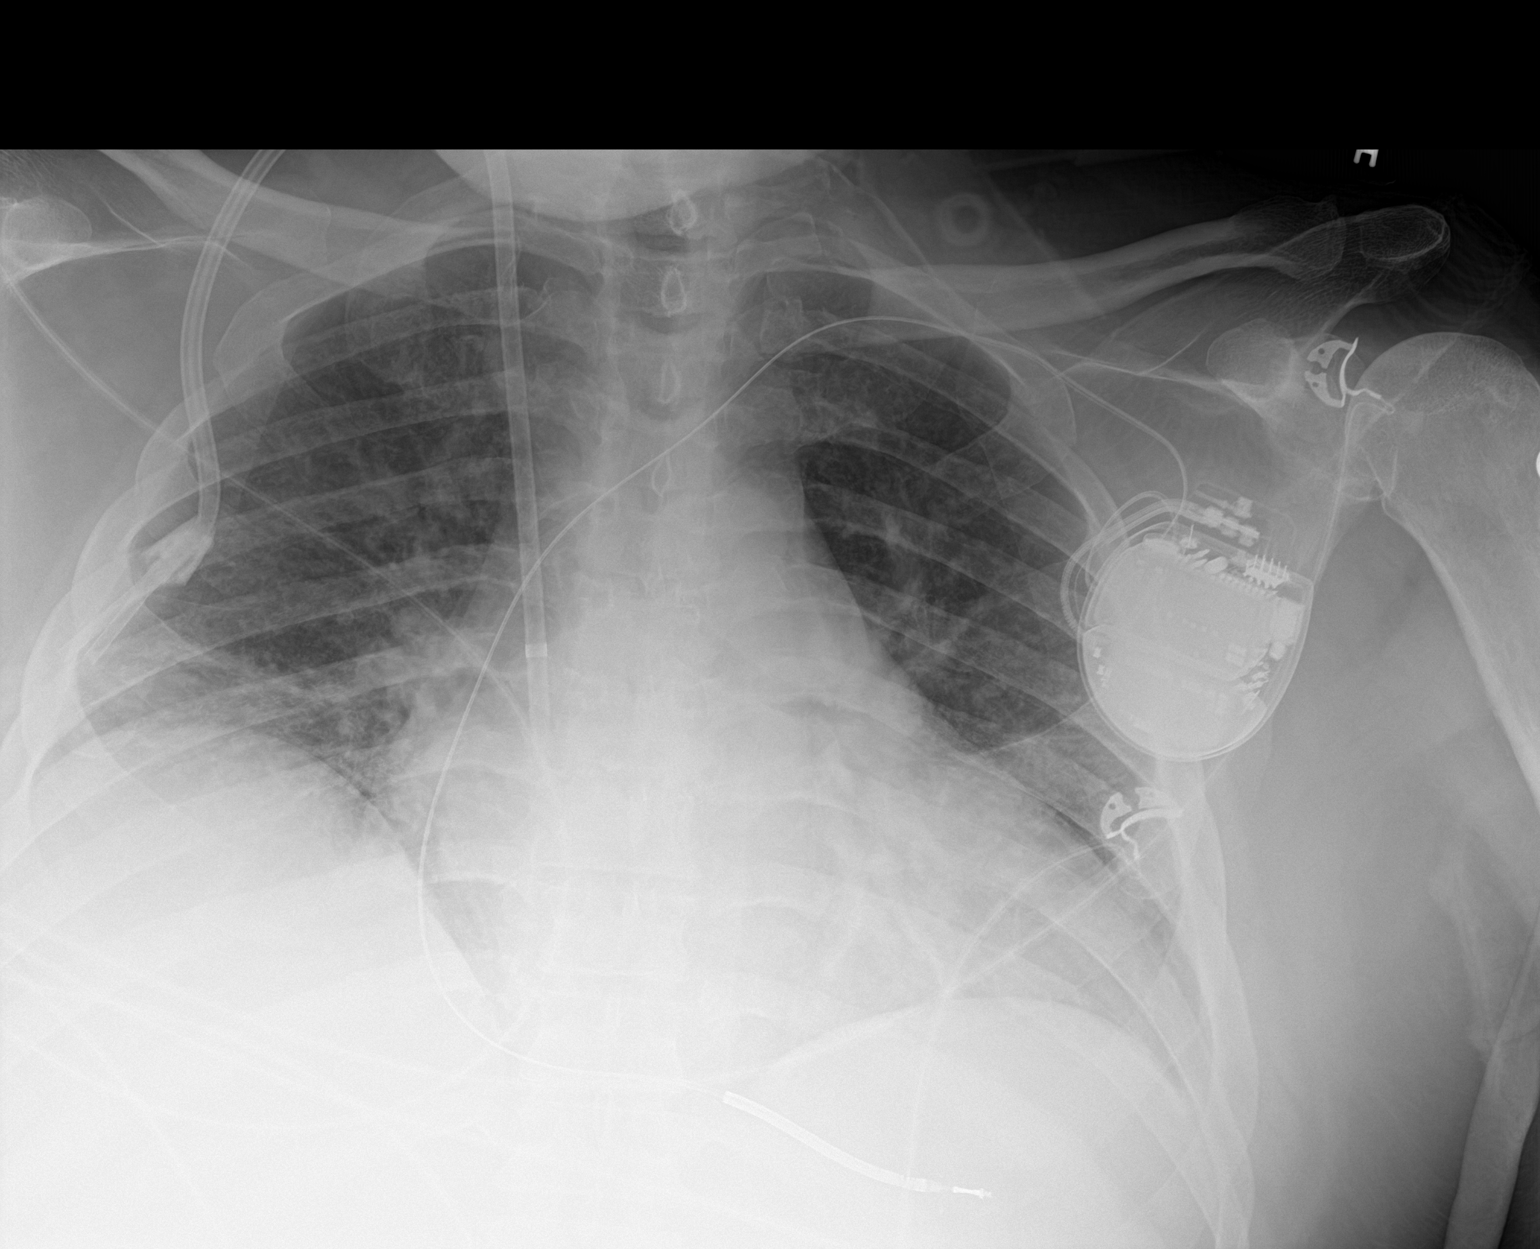

[1 of 1 positions shown; findings below may reference images not displayed]

FINDINGS: Interim extubation removal of NG tube. Right stable cardiomegaly.
Low lung volumes with mild bibasilar atelectasis/infiltrates. No
pleural effusion or pneumothorax. No acute bony abnormality.
IMPRESSION: 1. Interim extubation removal of NG tube. Right IJ line in stable
position.

2.  Cardiac pacer stable position.  Stable cardiomegaly.

3. Mild bibasilar atelectasis/infiltrates. No interim change. No
definite effusions noted on today's exam.
